# Patient Record
Sex: Male | Born: 1955 | Race: White | Hispanic: No | Marital: Married | Smoking: Former smoker
Health system: Southern US, Community
[De-identification: ages and names within clinical notes are randomized; demographics above are authoritative.]

## PROBLEM LIST (undated history)

## (undated) DIAGNOSIS — I10 Essential (primary) hypertension: Secondary | ICD-10-CM

## (undated) DIAGNOSIS — K219 Gastro-esophageal reflux disease without esophagitis: Secondary | ICD-10-CM

## (undated) DIAGNOSIS — I2510 Atherosclerotic heart disease of native coronary artery without angina pectoris: Secondary | ICD-10-CM

## (undated) DIAGNOSIS — G709 Myoneural disorder, unspecified: Secondary | ICD-10-CM

## (undated) DIAGNOSIS — M1990 Unspecified osteoarthritis, unspecified site: Secondary | ICD-10-CM

## (undated) DIAGNOSIS — T8859XA Other complications of anesthesia, initial encounter: Secondary | ICD-10-CM

## (undated) DIAGNOSIS — G4730 Sleep apnea, unspecified: Secondary | ICD-10-CM

## (undated) DIAGNOSIS — F419 Anxiety disorder, unspecified: Secondary | ICD-10-CM

## (undated) DIAGNOSIS — I509 Heart failure, unspecified: Secondary | ICD-10-CM

## (undated) DIAGNOSIS — I779 Disorder of arteries and arterioles, unspecified: Secondary | ICD-10-CM

## (undated) DIAGNOSIS — T7840XA Allergy, unspecified, initial encounter: Secondary | ICD-10-CM

## (undated) DIAGNOSIS — E785 Hyperlipidemia, unspecified: Secondary | ICD-10-CM

## (undated) DIAGNOSIS — M75101 Unspecified rotator cuff tear or rupture of right shoulder, not specified as traumatic: Secondary | ICD-10-CM

## (undated) DIAGNOSIS — F329 Major depressive disorder, single episode, unspecified: Secondary | ICD-10-CM

## (undated) DIAGNOSIS — C801 Malignant (primary) neoplasm, unspecified: Secondary | ICD-10-CM

## (undated) DIAGNOSIS — Z789 Other specified health status: Secondary | ICD-10-CM

## (undated) DIAGNOSIS — T4145XA Adverse effect of unspecified anesthetic, initial encounter: Secondary | ICD-10-CM

## (undated) DIAGNOSIS — I350 Nonrheumatic aortic (valve) stenosis: Secondary | ICD-10-CM

## (undated) DIAGNOSIS — F32A Depression, unspecified: Secondary | ICD-10-CM

## (undated) HISTORY — DX: Heart failure, unspecified: I50.9

## (undated) HISTORY — DX: Depression, unspecified: F32.A

## (undated) HISTORY — DX: Anxiety disorder, unspecified: F41.9

## (undated) HISTORY — DX: Unspecified osteoarthritis, unspecified site: M19.90

## (undated) HISTORY — PX: CHOLECYSTECTOMY: SHX55

## (undated) HISTORY — PX: LUMBAR LAMINECTOMY: SHX95

## (undated) HISTORY — DX: Allergy, unspecified, initial encounter: T78.40XA

## (undated) HISTORY — DX: Myoneural disorder, unspecified: G70.9

## (undated) HISTORY — PX: CARDIAC VALVE REPLACEMENT: SHX585

## (undated) HISTORY — DX: Major depressive disorder, single episode, unspecified: F32.9

## (undated) HISTORY — PX: FRACTURE SURGERY: SHX138

## (undated) HISTORY — DX: Sleep apnea, unspecified: G47.30

## (undated) HISTORY — DX: Gastro-esophageal reflux disease without esophagitis: K21.9

## (undated) HISTORY — PX: APPENDECTOMY: SHX54

## (undated) HISTORY — PX: SPINE SURGERY: SHX786

## (undated) HISTORY — DX: Hyperlipidemia, unspecified: E78.5

---

## 1999-04-11 ENCOUNTER — Emergency Department (HOSPITAL_COMMUNITY): Admission: EM | Admit: 1999-04-11 | Discharge: 1999-04-11 | Payer: Self-pay | Admitting: Emergency Medicine

## 1999-04-16 ENCOUNTER — Encounter: Payer: Self-pay | Admitting: Oral Surgery

## 1999-04-16 ENCOUNTER — Ambulatory Visit (HOSPITAL_COMMUNITY): Admission: RE | Admit: 1999-04-16 | Discharge: 1999-04-16 | Payer: Self-pay | Admitting: Oral Surgery

## 2003-06-09 ENCOUNTER — Ambulatory Visit (HOSPITAL_BASED_OUTPATIENT_CLINIC_OR_DEPARTMENT_OTHER): Admission: RE | Admit: 2003-06-09 | Discharge: 2003-06-09 | Payer: Self-pay | Admitting: Family Medicine

## 2005-12-05 ENCOUNTER — Encounter: Admission: RE | Admit: 2005-12-05 | Discharge: 2005-12-05 | Payer: Self-pay | Admitting: Family Medicine

## 2005-12-05 ENCOUNTER — Ambulatory Visit: Payer: Self-pay | Admitting: Family Medicine

## 2005-12-09 ENCOUNTER — Ambulatory Visit: Payer: Self-pay | Admitting: Family Medicine

## 2005-12-16 ENCOUNTER — Ambulatory Visit: Payer: Self-pay | Admitting: Family Medicine

## 2005-12-17 ENCOUNTER — Ambulatory Visit: Payer: Self-pay | Admitting: Gastroenterology

## 2005-12-20 ENCOUNTER — Ambulatory Visit: Payer: Self-pay | Admitting: Cardiovascular Disease

## 2005-12-20 ENCOUNTER — Inpatient Hospital Stay (HOSPITAL_COMMUNITY): Admission: EM | Admit: 2005-12-20 | Discharge: 2005-12-21 | Payer: Self-pay | Admitting: Emergency Medicine

## 2005-12-21 ENCOUNTER — Ambulatory Visit: Payer: Self-pay | Admitting: Internal Medicine

## 2005-12-25 ENCOUNTER — Ambulatory Visit: Payer: Self-pay

## 2006-01-11 ENCOUNTER — Encounter: Admission: RE | Admit: 2006-01-11 | Discharge: 2006-01-11 | Payer: Self-pay | Admitting: Family Medicine

## 2006-01-15 ENCOUNTER — Ambulatory Visit: Payer: Self-pay | Admitting: Family Medicine

## 2006-02-06 ENCOUNTER — Encounter (INDEPENDENT_AMBULATORY_CARE_PROVIDER_SITE_OTHER): Payer: Self-pay | Admitting: *Deleted

## 2006-02-06 ENCOUNTER — Ambulatory Visit (HOSPITAL_COMMUNITY): Admission: RE | Admit: 2006-02-06 | Discharge: 2006-02-07 | Payer: Self-pay | Admitting: General Surgery

## 2006-04-06 ENCOUNTER — Ambulatory Visit: Payer: Self-pay | Admitting: Family Medicine

## 2006-04-07 ENCOUNTER — Ambulatory Visit: Payer: Self-pay | Admitting: Family Medicine

## 2006-04-08 ENCOUNTER — Ambulatory Visit (HOSPITAL_COMMUNITY): Admission: RE | Admit: 2006-04-08 | Discharge: 2006-04-08 | Payer: Self-pay | Admitting: Gastroenterology

## 2006-04-08 ENCOUNTER — Encounter (INDEPENDENT_AMBULATORY_CARE_PROVIDER_SITE_OTHER): Payer: Self-pay | Admitting: *Deleted

## 2006-04-27 ENCOUNTER — Ambulatory Visit (HOSPITAL_COMMUNITY): Admission: RE | Admit: 2006-04-27 | Discharge: 2006-04-27 | Payer: Self-pay | Admitting: Urology

## 2006-06-02 ENCOUNTER — Ambulatory Visit: Payer: Self-pay | Admitting: Family Medicine

## 2006-07-06 ENCOUNTER — Ambulatory Visit: Payer: Self-pay | Admitting: Family Medicine

## 2006-09-04 ENCOUNTER — Ambulatory Visit: Payer: Self-pay | Admitting: Family Medicine

## 2006-12-08 ENCOUNTER — Ambulatory Visit: Payer: Self-pay | Admitting: Family Medicine

## 2006-12-10 ENCOUNTER — Ambulatory Visit: Payer: Self-pay | Admitting: Family Medicine

## 2006-12-10 LAB — CONVERTED CEMR LAB
BUN: 18 mg/dL (ref 6–23)
CO2: 26 meq/L (ref 19–32)
Calcium: 9.6 mg/dL (ref 8.4–10.5)
Chloride: 106 meq/L (ref 96–112)
GFR calc non Af Amer: 95 mL/min
Hgb A1c MFr Bld: 6 % (ref 4.6–6.0)
Microalb Creat Ratio: 6.7 mg/g (ref 0.0–30.0)
Sodium: 139 meq/L (ref 135–145)
Triglyceride fasting, serum: 148 mg/dL (ref 0–149)
VLDL: 30 mg/dL (ref 0–40)

## 2007-01-27 DIAGNOSIS — E1151 Type 2 diabetes mellitus with diabetic peripheral angiopathy without gangrene: Secondary | ICD-10-CM

## 2007-01-27 DIAGNOSIS — E785 Hyperlipidemia, unspecified: Secondary | ICD-10-CM | POA: Insufficient documentation

## 2007-01-27 DIAGNOSIS — E1165 Type 2 diabetes mellitus with hyperglycemia: Secondary | ICD-10-CM

## 2007-01-27 DIAGNOSIS — E119 Type 2 diabetes mellitus without complications: Secondary | ICD-10-CM | POA: Insufficient documentation

## 2007-02-09 ENCOUNTER — Ambulatory Visit: Payer: Self-pay | Admitting: Family Medicine

## 2007-03-10 ENCOUNTER — Ambulatory Visit: Payer: Self-pay | Admitting: Family Medicine

## 2007-03-10 LAB — CONVERTED CEMR LAB
ALT: 39 units/L (ref 0–40)
AST: 30 units/L (ref 0–37)
BUN: 15 mg/dL (ref 6–23)
CO2: 33 meq/L — ABNORMAL HIGH (ref 19–32)
Calcium: 8.9 mg/dL (ref 8.4–10.5)
Chloride: 102 meq/L (ref 96–112)
Glucose, Bld: 174 mg/dL — ABNORMAL HIGH (ref 70–99)
HDL: 31 mg/dL — ABNORMAL LOW (ref >39.0)
LDL Cholesterol: 54 mg/dL (ref 0–99)
Sodium: 140 meq/L (ref 135–145)
Total CHOL/HDL Ratio: 3.8
Total Protein: 6.9 g/dL (ref 6.0–8.3)

## 2007-09-13 ENCOUNTER — Telehealth (INDEPENDENT_AMBULATORY_CARE_PROVIDER_SITE_OTHER): Payer: Self-pay | Admitting: *Deleted

## 2007-09-13 ENCOUNTER — Emergency Department (HOSPITAL_COMMUNITY): Admission: EM | Admit: 2007-09-13 | Discharge: 2007-09-13 | Payer: Self-pay | Admitting: Emergency Medicine

## 2007-09-15 ENCOUNTER — Ambulatory Visit: Payer: Self-pay | Admitting: Family Medicine

## 2007-09-21 LAB — CONVERTED CEMR LAB
AST: 31 units/L (ref 0–37)
Albumin: 4 g/dL (ref 3.5–5.2)
Alkaline Phosphatase: 90 units/L (ref 39–117)
BUN: 18 mg/dL (ref 6–23)
Calcium: 10 mg/dL (ref 8.4–10.5)
Creatinine, Ser: 0.8 mg/dL (ref 0.4–1.5)
GFR calc Af Amer: 131 mL/min
GFR calc non Af Amer: 108 mL/min
HCT: 41.3 % (ref 39.0–52.0)
Hemoglobin: 14.5 g/dL (ref 13.0–17.0)
Hgb A1c MFr Bld: 7 % — ABNORMAL HIGH (ref 4.6–6.0)
Lymphocytes Relative: 35.1 % (ref 12.0–46.0)
MCHC: 35 g/dL (ref 30.0–36.0)
Monocytes Relative: 9.8 % (ref 3.0–11.0)
Potassium: 4.9 meq/L (ref 3.5–5.1)
RDW: 12.9 % (ref 11.5–14.6)
Sodium: 141 meq/L (ref 135–145)
Total Protein: 7.1 g/dL (ref 6.0–8.3)

## 2007-11-23 ENCOUNTER — Telehealth (INDEPENDENT_AMBULATORY_CARE_PROVIDER_SITE_OTHER): Payer: Self-pay | Admitting: *Deleted

## 2007-11-24 ENCOUNTER — Ambulatory Visit: Payer: Self-pay | Admitting: Family Medicine

## 2007-11-24 DIAGNOSIS — M542 Cervicalgia: Secondary | ICD-10-CM

## 2008-02-15 ENCOUNTER — Telehealth (INDEPENDENT_AMBULATORY_CARE_PROVIDER_SITE_OTHER): Payer: Self-pay | Admitting: *Deleted

## 2008-10-03 ENCOUNTER — Ambulatory Visit: Payer: Self-pay | Admitting: Internal Medicine

## 2008-10-03 ENCOUNTER — Encounter: Payer: Self-pay | Admitting: Family Medicine

## 2008-10-03 ENCOUNTER — Telehealth: Payer: Self-pay | Admitting: Family Medicine

## 2008-10-03 ENCOUNTER — Ambulatory Visit: Payer: Self-pay | Admitting: Cardiology

## 2008-10-03 ENCOUNTER — Encounter: Payer: Self-pay | Admitting: Emergency Medicine

## 2008-10-03 ENCOUNTER — Observation Stay (HOSPITAL_COMMUNITY): Admission: AD | Admit: 2008-10-03 | Discharge: 2008-10-04 | Payer: Self-pay | Admitting: Internal Medicine

## 2008-10-04 ENCOUNTER — Ambulatory Visit: Payer: Self-pay | Admitting: Vascular Surgery

## 2008-10-04 ENCOUNTER — Encounter: Payer: Self-pay | Admitting: Family Medicine

## 2008-10-04 ENCOUNTER — Encounter (INDEPENDENT_AMBULATORY_CARE_PROVIDER_SITE_OTHER): Payer: Self-pay | Admitting: Internal Medicine

## 2008-10-10 ENCOUNTER — Telehealth (INDEPENDENT_AMBULATORY_CARE_PROVIDER_SITE_OTHER): Payer: Self-pay | Admitting: *Deleted

## 2008-10-10 DIAGNOSIS — R0789 Other chest pain: Secondary | ICD-10-CM

## 2008-10-17 ENCOUNTER — Ambulatory Visit: Payer: Self-pay | Admitting: Family Medicine

## 2008-10-17 DIAGNOSIS — M79609 Pain in unspecified limb: Secondary | ICD-10-CM

## 2008-10-18 ENCOUNTER — Encounter (INDEPENDENT_AMBULATORY_CARE_PROVIDER_SITE_OTHER): Payer: Self-pay | Admitting: *Deleted

## 2008-10-24 ENCOUNTER — Ambulatory Visit: Payer: Self-pay

## 2008-10-24 ENCOUNTER — Encounter: Payer: Self-pay | Admitting: Family Medicine

## 2008-10-24 ENCOUNTER — Encounter (INDEPENDENT_AMBULATORY_CARE_PROVIDER_SITE_OTHER): Payer: Self-pay | Admitting: *Deleted

## 2008-10-25 ENCOUNTER — Ambulatory Visit: Payer: Self-pay | Admitting: Cardiology

## 2008-10-25 ENCOUNTER — Encounter: Payer: Self-pay | Admitting: Family Medicine

## 2008-10-30 ENCOUNTER — Encounter (INDEPENDENT_AMBULATORY_CARE_PROVIDER_SITE_OTHER): Payer: Self-pay | Admitting: *Deleted

## 2008-11-02 ENCOUNTER — Encounter (INDEPENDENT_AMBULATORY_CARE_PROVIDER_SITE_OTHER): Payer: Self-pay | Admitting: *Deleted

## 2008-11-02 ENCOUNTER — Encounter: Payer: Self-pay | Admitting: Family Medicine

## 2008-11-07 ENCOUNTER — Telehealth (INDEPENDENT_AMBULATORY_CARE_PROVIDER_SITE_OTHER): Payer: Self-pay | Admitting: *Deleted

## 2008-11-07 ENCOUNTER — Encounter: Payer: Self-pay | Admitting: Family Medicine

## 2009-01-19 ENCOUNTER — Ambulatory Visit: Payer: Self-pay | Admitting: Family Medicine

## 2009-01-20 ENCOUNTER — Encounter: Payer: Self-pay | Admitting: Family Medicine

## 2009-01-22 ENCOUNTER — Encounter (INDEPENDENT_AMBULATORY_CARE_PROVIDER_SITE_OTHER): Payer: Self-pay | Admitting: *Deleted

## 2009-02-02 LAB — CONVERTED CEMR LAB
ALT: 25 units/L (ref 0–53)
BUN: 18 mg/dL (ref 6–23)
Basophils Absolute: 0 10*3/uL (ref 0.0–0.1)
CO2: 29 meq/L (ref 19–32)
Calcium: 9.5 mg/dL (ref 8.4–10.5)
Chloride: 105 meq/L (ref 96–112)
Creatinine, Ser: 0.7 mg/dL (ref 0.4–1.5)
Eosinophils Absolute: 0.2 10*3/uL (ref 0.0–0.7)
GFR calc non Af Amer: 125 mL/min
LDL Cholesterol: 124 mg/dL — ABNORMAL HIGH (ref 0–99)
Microalb Creat Ratio: 8.4 mg/g (ref 0.0–30.0)
Microalb, Ur: 1.6 mg/dL (ref 0.0–1.9)
Monocytes Relative: 7.3 % (ref 3.0–12.0)
Neutrophils Relative %: 56.9 % (ref 43.0–77.0)
Potassium: 4.3 meq/L (ref 3.5–5.1)
RBC: 5.31 M/uL (ref 4.22–5.81)
RDW: 12.9 % (ref 11.5–14.6)
Sodium: 140 meq/L (ref 135–145)
Triglycerides: 112 mg/dL (ref 0–149)
VLDL: 22 mg/dL (ref 0–40)
WBC: 6.3 10*3/uL (ref 4.5–10.5)

## 2009-04-02 ENCOUNTER — Telehealth (INDEPENDENT_AMBULATORY_CARE_PROVIDER_SITE_OTHER): Payer: Self-pay | Admitting: *Deleted

## 2009-11-26 ENCOUNTER — Encounter: Payer: Self-pay | Admitting: Family Medicine

## 2010-03-01 ENCOUNTER — Telehealth (INDEPENDENT_AMBULATORY_CARE_PROVIDER_SITE_OTHER): Payer: Self-pay | Admitting: *Deleted

## 2010-03-26 ENCOUNTER — Encounter (INDEPENDENT_AMBULATORY_CARE_PROVIDER_SITE_OTHER): Payer: Self-pay | Admitting: *Deleted

## 2010-03-26 ENCOUNTER — Ambulatory Visit: Payer: Self-pay | Admitting: Family Medicine

## 2010-03-26 DIAGNOSIS — R110 Nausea: Secondary | ICD-10-CM | POA: Insufficient documentation

## 2010-03-26 DIAGNOSIS — N4889 Other specified disorders of penis: Secondary | ICD-10-CM | POA: Insufficient documentation

## 2010-03-26 LAB — CONVERTED CEMR LAB
Bilirubin Urine: NEGATIVE
Blood in Urine, dipstick: NEGATIVE
Specific Gravity, Urine: 1.015
WBC Urine, dipstick: NEGATIVE

## 2010-03-27 ENCOUNTER — Encounter (INDEPENDENT_AMBULATORY_CARE_PROVIDER_SITE_OTHER): Payer: Self-pay | Admitting: *Deleted

## 2010-03-27 ENCOUNTER — Ambulatory Visit: Payer: Self-pay | Admitting: Family Medicine

## 2010-03-28 ENCOUNTER — Encounter (INDEPENDENT_AMBULATORY_CARE_PROVIDER_SITE_OTHER): Payer: Self-pay | Admitting: *Deleted

## 2010-03-29 ENCOUNTER — Telehealth (INDEPENDENT_AMBULATORY_CARE_PROVIDER_SITE_OTHER): Payer: Self-pay | Admitting: *Deleted

## 2010-03-29 LAB — CONVERTED CEMR LAB
Albumin: 4 g/dL (ref 3.5–5.2)
Alkaline Phosphatase: 92 units/L (ref 39–117)
Basophils Absolute: 0 10*3/uL (ref 0.0–0.1)
Basophils Relative: 0.2 % (ref 0.0–3.0)
CO2: 28 meq/L (ref 19–32)
Calcium: 9.2 mg/dL (ref 8.4–10.5)
Chloride: 104 meq/L (ref 96–112)
Creatinine, Ser: 0.8 mg/dL (ref 0.4–1.5)
Creatinine,U: 106 mg/dL
Direct LDL: 109.1 mg/dL
Eosinophils Absolute: 0.2 10*3/uL (ref 0.0–0.7)
GFR calc non Af Amer: 106.77 mL/min (ref >60)
Glucose, Bld: 164 mg/dL — ABNORMAL HIGH (ref 70–99)
HCT: 44.1 % (ref 39.0–52.0)
HDL: 33.8 mg/dL — ABNORMAL LOW (ref >39.00)
MCV: 84.5 fL (ref 78.0–100.0)
Microalb, Ur: 1 mg/dL (ref 0.0–1.9)
Potassium: 4.3 meq/L (ref 3.5–5.1)
RBC: 5.22 M/uL (ref 4.22–5.81)
RDW: 12.3 % (ref 11.5–14.6)
Triglycerides: 234 mg/dL — ABNORMAL HIGH (ref 0.0–149.0)

## 2010-04-01 ENCOUNTER — Encounter: Payer: Self-pay | Admitting: Family Medicine

## 2010-04-04 ENCOUNTER — Encounter: Payer: Self-pay | Admitting: Family Medicine

## 2010-04-15 ENCOUNTER — Ambulatory Visit: Payer: Self-pay | Admitting: Family Medicine

## 2010-04-16 ENCOUNTER — Encounter: Payer: Self-pay | Admitting: Family Medicine

## 2010-05-02 ENCOUNTER — Encounter: Payer: Self-pay | Admitting: Family Medicine

## 2010-05-30 ENCOUNTER — Telehealth: Payer: Self-pay | Admitting: Family Medicine

## 2010-06-06 ENCOUNTER — Encounter: Payer: Self-pay | Admitting: Family Medicine

## 2010-08-26 ENCOUNTER — Encounter: Payer: Self-pay | Admitting: Family Medicine

## 2010-11-05 ENCOUNTER — Ambulatory Visit: Payer: Self-pay | Admitting: Family Medicine

## 2010-11-05 ENCOUNTER — Telehealth: Payer: Self-pay | Admitting: Family Medicine

## 2010-11-05 DIAGNOSIS — R5383 Other fatigue: Secondary | ICD-10-CM

## 2010-11-05 DIAGNOSIS — R5381 Other malaise: Secondary | ICD-10-CM

## 2010-11-05 LAB — CONVERTED CEMR LAB
Bilirubin Urine: NEGATIVE
Nitrite: NEGATIVE
Specific Gravity, Urine: 1.025
Urobilinogen, UA: NEGATIVE
WBC Urine, dipstick: NEGATIVE

## 2010-11-12 ENCOUNTER — Telehealth (INDEPENDENT_AMBULATORY_CARE_PROVIDER_SITE_OTHER): Payer: Self-pay | Admitting: *Deleted

## 2010-11-12 LAB — CONVERTED CEMR LAB
BUN: 16 mg/dL
Basophils Absolute: 0 K/uL
Basophils Relative: 0.5 %
CO2: 31 meq/L
Calcium: 9.9 mg/dL
Chloride: 100 meq/L
Creatinine, Ser: 0.9 mg/dL
Creatinine,U: 141.8 mg/dL
Eosinophils Absolute: 0.2 K/uL
Eosinophils Relative: 2.5 %
Folate: 10.9 ng/mL
GFR calc non Af Amer: 99.33 mL/min
Glucose, Bld: 125 mg/dL — ABNORMAL HIGH
HCT: 43.4 %
Hemoglobin: 15.1 g/dL
Lymphocytes Relative: 36.6 %
Lymphs Abs: 2.3 K/uL
MCHC: 34.8 g/dL
MCV: 84.4 fL
Microalb Creat Ratio: 2.6 mg/g
Microalb, Ur: 3.7 mg/dL — ABNORMAL HIGH
Monocytes Absolute: 0.5 K/uL
Monocytes Relative: 8.3 %
Neutro Abs: 3.2 K/uL
Neutrophils Relative %: 52.1 %
Platelets: 189 K/uL
Potassium: 4.7 meq/L
RBC: 5.14 M/uL
RDW: 13.3 %
Sodium: 139 meq/L
Vitamin B-12: 301 pg/mL
WBC: 6.2 10*3/microliter

## 2010-11-26 ENCOUNTER — Ambulatory Visit: Payer: Self-pay | Admitting: Family Medicine

## 2010-11-28 ENCOUNTER — Ambulatory Visit: Payer: Self-pay | Admitting: Family Medicine

## 2011-01-13 ENCOUNTER — Telehealth (INDEPENDENT_AMBULATORY_CARE_PROVIDER_SITE_OTHER): Payer: Self-pay | Admitting: *Deleted

## 2011-01-19 ENCOUNTER — Encounter: Payer: Self-pay | Admitting: Urology

## 2011-01-20 ENCOUNTER — Encounter: Payer: Self-pay | Admitting: Family Medicine

## 2011-01-22 ENCOUNTER — Telehealth: Payer: Self-pay | Admitting: Family Medicine

## 2011-01-28 NOTE — Medication Information (Signed)
Summary: Care Consideration Regarding Adding a Statin/[employer]  Care Consideration Regarding Adding a Statin/[employer]   Imported By: Lanelle Bal 10/04/2010 10:59:13  _____________________________________________________________________  External Attachment:    Type:   Image     Comment:   External Document

## 2011-01-28 NOTE — Consult Note (Signed)
Summary: [facility]  [facility]   Imported By: Lanelle Bal 04/26/2010 14:09:10  _____________________________________________________________________  External Attachment:    Type:   Image     Comment:   External Document

## 2011-01-28 NOTE — Progress Notes (Signed)
Summary: Request for Pain meds  Phone Note Call from Patient   Caller: Patient Call For: [name] DO Details for Reason: Meds Request Summary of Call:  In office---Pt wanted to get and RX for Oxycodone 5-325 and another Rx will call back with the information on the other Rx when he gets home  sent to [employer] c/b [id number]. Please advise Initial call taken by: [name] [name] Dull),  November 05, 2010 11:07 AM  Follow-up for Phone Call        ok to refill x1 Follow-up by: [name] DO,  November 05, 2010 11:52 AM  Additional Follow-up for Phone Call Additional follow up Details #1::        pt called and also wanted to get an Rx for Piroxicam 20 mg, please advise on Direction as well. Additional Follow-up by: [name] [name] Dull),  November 05, 2010 2:07 PM    Additional Follow-up for Phone Call Additional follow up Details #2::    piroxicam 20 mg  #30 1 by mouth once daily prn   1 refils Follow-up by: [name] DO,  November 05, 2010 2:08 PM  New/Updated Medications: PERCOCET 5-325 MG TABS (OXYCODONE-ACETAMINOPHEN) 1 by mouth every 4-6 hours as needed PIROXICAM 20 MG CAPS (PIROXICAM) 1 by mouth once daily Prescriptions: PIROXICAM 20 MG CAPS (PIROXICAM) 1 by mouth once daily  #30 x 1   Entered by:   [name] CMA (AAMA)   Authorized by:   [name] DO   Signed by:   [name] CMA (AAMA) on 11/05/2010   Method used:   Print then Give to Patient   RxID:   [id number] PERCOCET 5-325 MG TABS (OXYCODONE-ACETAMINOPHEN) 1 by mouth every 4-6 hours as needed  #30 x 0   Entered by:   [name] CMA (AAMA)   Authorized by:   [name] DO   Signed by:   [name] CMA (AAMA) on 11/05/2010   Method used:   Print then Give to Patient   RxID:   [id number]  Vm left for pt to pickup meds at the office.... [name] [name] Dull)  November 05, 2010 2:58 PM

## 2011-01-28 NOTE — Letter (Signed)
Summary: New Patient letter  [facility]  [address] [place], [place] [address]   Phone: [phone]  Fax: [phone]       03/27/2010 MRN: [id number]  [facility] [address] [place], [place]  [address]  Dear Ronald Petersen,  Welcome to the Gastroenterology Division at [facility].    You are scheduled to see Dr. Christella Hartigan on 04-30-10 at 11:00a.m. on the 3rd floor at [facility], [address]  We ask that you try to arrive at our office 15 minutes prior to your appointment time to allow for check-in.  We would like you to complete the enclosed self-administered evaluation form prior to your visit and bring it with you on the day of your appointment.  We will review it with you.  Also, please bring a complete list of all your medications or, if you prefer, bring the medication bottles and we will list them.  Please bring your insurance card so that we may make a copy of it.  If your insurance requires a referral to see a specialist, please bring your referral form from your primary care physician.  Co-payments are due at the time of your visit and may be paid by cash, check or credit card.     Your office visit will consist of a consult with your physician (includes a physical exam), any laboratory testing he/she may order, scheduling of any necessary diagnostic testing (e.g. x-ray, ultrasound, CT-scan), and scheduling of a procedure (e.g. Endoscopy, Colonoscopy) if required.  Please allow enough time on your schedule to allow for any/all of these possibilities.    If you cannot keep your appointment, please call [phone] to cancel or reschedule prior to your appointment date.  This allows [place] the opportunity to schedule an appointment for another patient in need of care.  If you do not cancel or reschedule by 5 p.m. the business day prior to your appointment date, you will be charged a $50.00 late cancellation/no-show fee.    Thank you for choosing  [facility] for your medical needs.  We appreciate the opportunity to care for you.  Please visit [place] at our website  to learn more about our practice.                     Sincerely,                                                             The Gastroenterology Division

## 2011-01-28 NOTE — Progress Notes (Signed)
Summary: ZOLOFT/SERTRALINE REFILL  Phone Note Call from Patient Call back at Home Phone [phone]   Caller: Patient Summary of Call: PATIENT DROPPED OFF YELLOW REFILL AUTHORIZATION FORM FROM [facility] FOR ZOLOFT 100MG  OR SERTRALINE 100 MG---SAYS HE HAS BEEN OUT FOR FIVE DAYS ALREADY, SO HE WOULD REALLY LIKE IT IF IT COULD BE CALLED IN TODAY  TOOK FORM IN PLASTIC SLEEVE TO [facility] SINCE HE IS A PATIENT OF DR LOWNE--PLEASE CALL HIM TO TELL HIM WHEN IT HAS BEEN CALLED IN Initial call taken by: [name],  March 01, 2010 12:38 PM  Follow-up for Phone Call        left pt detail message rx sent to pharmacy. pt has pending OV 03-26-10.......................Marland KitchenFelecia Deloach CMA  March 01, 2010 2:09 PM     Prescriptions: ZOLOFT 100 MG TABS (SERTRALINE HCL) Take 1 tablet by mouth once a day  #30 Each x 0   Entered by:   Jeremy Johann CMA   Authorized by:   [name] DO   Signed by:   Jeremy Johann CMA on 03/01/2010   Method used:   Faxed to ...       [facility]* (retail)       [address] [place], [place]  [address]       Ph: [phone]       Fax: [phone]   RxID:   [id number]

## 2011-01-28 NOTE — Consult Note (Signed)
Summary: [facility]   Imported By: Lanelle Bal 06/15/2010 10:58:55  _____________________________________________________________________  External Attachment:    Type:   Image     Comment:   External Document

## 2011-01-28 NOTE — Progress Notes (Signed)
Summary: labs  Phone Note Outgoing Call   Call placed by: Doristine Devoid CMA,  November 12, 2010 4:48 PM Call placed to: Patient Summary of Call: so far great! ---cholesterol and hgba1c still pending make sure pt is only taking kombiglyza-----not metformin and kombiglyza recheck 6 months   Follow-up for Phone Call        left message on machine ....Marland KitchenMarland KitchenDoristine Devoid CMA  November 12, 2010 4:48 PM   Additional Follow-up for Phone Call Additional follow up Details #1::        Patient is aware and is only taking kombiglyza. Additional Follow-up by: Harold Barban,  November 13, 2010 2:01 PM

## 2011-01-28 NOTE — Consult Note (Signed)
Summary: [facility]  [facility]   Imported By: Lanelle Bal 04/18/2010 12:04:24  _____________________________________________________________________  External Attachment:    Type:   Image     Comment:   External Document

## 2011-01-28 NOTE — Progress Notes (Signed)
Summary: Lab Results  Phone Note Outgoing Call Call back at Home Phone [phone]   Call placed by: Shonna Chock,  March 29, 2010 8:58 AM Call placed to: Patient Summary of Call: Left message on machine for patient to return call when avaliable, Reason for call:   DM-- hgba1c slightly elevated---- change glucophage to kombiglyza-- 5/ 1000 1 by mouth once daily #30  2 refills----give coupon with rx Ideally your LDL (bad cholesterol) should be <__70__, your HDL (good cholesterol) should be >_40__ and your triglycerides should be< 150.  Diet and exercise will increase HDL and decrease the LDL and triglycerides. Read Dr. Danice Goltz book--Eat Drink and Be Healthy.   Start Lipitor 20 mg #30  1 by mouth  at bedtime,  2 refills--- give $4 coupon with rx.  We will recheck labs in __3_ months.   250.00  272.4  lipid, bmp, hep, hgba1c, microalbumin   Chrae Malloy  March 29, 2010 8:58 AM   Follow-up for Phone Call        Left message to call back. Army Fossa CMA  April 02, 2010 3:12 PM   Additional Follow-up for Phone Call Additional follow up Details #1::        Pt is aware. Army Fossa CMA  April 05, 2010 8:44 AM Coupons upfront for pt. Army Fossa CMA  April 05, 2010 8:45 AM     New/Updated Medications: [name] XR 04-999 MG XR24H-TAB (SAXAGLIPTIN-METFORMIN) 1 by mouth daily. LIPITOR 20 MG TABS (ATORVASTATIN CALCIUM) 1 by mouth at bedtime. Prescriptions: LIPITOR 20 MG TABS (ATORVASTATIN CALCIUM) 1 by mouth at bedtime.  #30 x 2   Entered by:   Army Fossa CMA   Authorized by:   [name] DO   Signed by:   Army Fossa CMA on 04/05/2010   Method used:   Electronically to        [facility]* (retail)       [address] [place], [place]  [address]       Ph: [phone]       Fax: [phone]   RxID:   [id number] [name] XR 04-999 MG XR24H-TAB (SAXAGLIPTIN-METFORMIN) 1 by mouth daily.  #30 x 2   Entered by:   Army Fossa CMA   Authorized by:   [name] DO   Signed by:   Army Fossa CMA on 04/05/2010   Method used:   Electronically to        [facility]* (retail)       [address] [place], [place]  [address]       Ph: [phone]       Fax: [phone]   RxID:   [id number]

## 2011-01-28 NOTE — Assessment & Plan Note (Signed)
Summary: CPX/KDC   Vital Signs:  Patient profile:   55 year old male Height:      73.5 inches Weight:      247 pounds BMI:     32.26 Pulse rate:   70 / minute Pulse rhythm:   regular BP sitting:   124 / 82  (left arm) Cuff size:   large  Vitals Entered By: Army Fossa CMA (March 26, 2010 1:12 PM) CC: CPX. No complaints   History of Present Illness: Pt here for cpe.  Pt c/o nausea with any eating-- no abd pain, no other reflux symptoms. Pt also c/o knot on penis that is getting bigger and is tender.    Type 1 diabetes mellitus follow-up      This is a 55 year old man who presents with Type 2 diabetes mellitus follow-up.  The patient denies polyuria, polydipsia, blurred vision, self managed hypoglycemia, hypoglycemia requiring help, weight loss, weight gain, and numbness of extremities.  The patient denies the following symptoms: neuropathic pain, chest pain, vomiting, orthostatic symptoms, poor wound healing, intermittent claudication, vision loss, and foot ulcer.  Since the last visit the patient reports good dietary compliance, compliance with medications fair, exercising regularly, and not monitoring blood glucose.  Since the last visit, the patient reports having had eye care by an ophthalmologist and no foot care.  Pt is not taking the metformin two times a day.  Preventive Screening-Counseling & Management  Alcohol-Tobacco     Alcohol drinks/day: <1     Smoking Status: never  Caffeine-Diet-Exercise     Caffeine use/day: 3     Does Patient Exercise: yes     Type of exercise: walking     Times/week: 5  Hep-HIV-STD-Contraception     Dental Visit-last 6 months yes     Dental Care Counseling: not indicated; dental care within six months  Safety-Violence-Falls     Seat Belt Use: 100      Sexual History:  currently monogamous.    Current Medications (verified): 1)  Zoloft 100 Mg Tabs (Sertraline Hcl) .... Take 1 Tablet By Mouth Once A Day 2)  Metformin Hcl 1000 Mg  Tabs (Metformin Hcl) .... Take 1 Tablet By Mouth Twice A Day. Labs and Office Visit Due. 3)  Omeprazole 20 Mg Cpdr (Omeprazole) .Marland Kitchen.. 1 By Mouth Once Daily  Allergies: 1)  Niaspan (Niacin (Antihyperlipidemic))  Past History:  Past Medical History: Last updated: 01/27/2007 Diabetes mellitus, type II Hyperlipidemia  Past Surgical History: Last updated: 01/19/2009 Lumbar laminectomy   Appendectomy Cholecystectomy  Family History: Last updated: 03/26/2010 Family History of Alcoholism/Addiction Family History Depression Family History of Arthritis Family History Hypertension Family History Ovarian cancer--Sister Arboriculturist MGM--stomach cancer m--copd Family History of Stroke M 1st degree relative 73 yo-- father Family History of CAD Male 1st degree relative <60  Social History: Last updated: 01/19/2009 Occupation:  [employer] Married Never Smoked Alcohol use-no Drug use-no Regular exercise-no  Risk Factors: Alcohol Use: <1 (03/26/2010) Caffeine Use: 3 (03/26/2010) Exercise: yes (03/26/2010)  Risk Factors: Smoking Status: never (03/26/2010)  Family History: Reviewed history from 01/19/2009 and no changes required. Family History of Alcoholism/Addiction Family History Depression Family History of Arthritis Family History Hypertension Family History Ovarian cancer--Sister Arboriculturist MGM--stomach cancer m--copd Family History of Stroke M 1st degree relative 15 yo-- father Family History of CAD Male 1st degree relative <60  Social History: Reviewed history from 01/19/2009 and no changes required. Occupation:  [employer] Married Never Smoked Alcohol use-no Drug use-no  Regular exercise-no Does Patient Exercise:  yes Dental Care w/in 6 mos.:  yes Sexual History:  currently monogamous  Review of Systems      See HPI General:  Denies chills, fatigue, fever, loss of appetite, malaise, sleep disorder, sweats,  weakness, and weight loss. Eyes:  Denies blurring, discharge, double vision, eye irritation, eye pain, halos, itching, light sensitivity, red eye, vision loss-1 eye, and vision loss-both eyes; ophtho--due. ENT:  Denies decreased hearing, difficulty swallowing, ear discharge, earache, hoarseness, nasal congestion, nosebleeds, postnasal drainage, ringing in ears, sinus pressure, and sore throat. CV:  Denies bluish discoloration of lips or nails, chest pain or discomfort, difficulty breathing at night, difficulty breathing while lying down, fainting, fatigue, leg cramps with exertion, lightheadness, near fainting, palpitations, shortness of breath with exertion, swelling of feet, swelling of hands, and weight gain. Resp:  Denies chest discomfort, chest pain with inspiration, cough, coughing up blood, excessive snoring, hypersomnolence, morning headaches, pleuritic, shortness of breath, sputum productive, and wheezing. GI:  Complains of nausea; denies abdominal pain, bloody stools, change in bowel habits, constipation, dark tarry stools, diarrhea, excessive appetite, gas, hemorrhoids, indigestion, loss of appetite, vomiting, vomiting blood, and yellowish skin color. GU:  Denies decreased libido, discharge, dysuria, erectile dysfunction, genital sores, hematuria, incontinence, nocturia, urinary frequency, and urinary hesitancy. MS:  Denies joint pain, joint redness, joint swelling, loss of strength, low back pain, mid back pain, muscle aches, muscle , cramps, muscle weakness, stiffness, and thoracic pain. Derm:  Denies changes in color of skin, changes in nail beds, dryness, excessive perspiration, flushing, hair loss, insect bite(s), itching, lesion(s), poor wound healing, and rash. Neuro:  Denies brief paralysis, difficulty with concentration, disturbances in coordination, falling down, headaches, inability to speak, memory loss, numbness, poor balance, seizures, sensation of room spinning, tingling, tremors,  visual disturbances, and weakness. Psych:  Denies alternate hallucination ( auditory/visual), anxiety, depression, easily angered, easily tearful, irritability, mental problems, panic attacks, sense of great danger, suicidal thoughts/plans, thoughts of violence, unusual visions or sounds, and thoughts /plans of harming others. Endo:  Denies cold intolerance, excessive hunger, excessive thirst, excessive urination, heat intolerance, polyuria, and weight change. Heme:  Denies abnormal bruising, bleeding, enlarge lymph nodes, fevers, pallor, and skin discoloration. Allergy:  Denies hives or rash, itching eyes, persistent infections, seasonal allergies, and sneezing.  Physical Exam  General:  Well-developed,well-nourished,in no acute distress; alert,appropriate and cooperative throughout examination Head:  Normocephalic and atraumatic without obvious abnormalities. No apparent alopecia or balding. Eyes:  vision grossly intact, pupils equal, pupils round, pupils reactive to light, and no injection.   Ears:  External ear exam shows no significant lesions or deformities.  Otoscopic examination reveals clear canals, tympanic membranes are intact bilaterally without bulging, retraction, inflammation or discharge. Hearing is grossly normal bilaterally. Nose:  External nasal examination shows no deformity or inflammation. Nasal mucosa are pink and moist without lesions or exudates. Mouth:  Oral mucosa and oropharynx without lesions or exudates.  Teeth in good repair. Neck:  No deformities, masses, or tenderness noted. Lungs:  Normal respiratory effort, chest expands symmetrically. Lungs are clear to auscultation, no crackles or wheezes. Heart:  Normal rate and regular rhythm. S1 and S2 normal without gallop, murmur, click, rub or other extra sounds. Abdomen:  soft, non-tender, normal bowel sounds, no distention, no masses, no guarding, no rigidity, and no rebound tenderness.  + ventral hernia Rectal:  No  external abnormalities noted. Normal sphincter tone. No rectal masses or tenderness. Genitalia:  Testes bilaterally descended without nodularity, tenderness or  masses. No scrotal masses or lesions. + penile nodule ant penis--pea sized Prostate:  Prostate gland firm and smooth, no enlargement, nodularity, tenderness, mass, asymmetry or induration. Msk:  normal ROM, no joint tenderness, no joint swelling, no joint warmth, no redness over joints, no joint deformities, no joint instability, and no crepitation.   Pulses:  R posterior tibial normal, R dorsalis pedis normal, R carotid normal, L posterior tibial normal, L dorsalis pedis normal, and L carotid normal.   Extremities:  No clubbing, cyanosis, edema, or deformity noted with normal full range of motion of all joints.   Neurologic:  No cranial nerve deficits noted. Station and gait are normal. Plantar reflexes are down-going bilaterally. DTRs are symmetrical throughout. Sensory, motor and coordinative functions appear intact. Skin:  Intact without suspicious lesions or rashes Cervical Nodes:  No lymphadenopathy noted Psych:  Cognition and judgment appear intact. Alert and cooperative with normal attention span and concentration. No apparent delusions, illusions, hallucinations  Diabetes Management Exam:    Foot Exam (with socks and/or shoes not present):       Sensory-Pinprick/Light touch:          Left medial foot (L-4): normal          Left dorsal foot (L-5): normal          Left lateral foot (S-1): normal          Right medial foot (L-4): normal          Right dorsal foot (L-5): normal          Right lateral foot (S-1): normal       Sensory-Monofilament:          Left foot: normal          Right foot: normal       Inspection:          Left foot: normal          Right foot: normal       Nails:          Left foot: normal          Right foot: normal   Impression & Recommendations:  Problem # 1:  PREVENTIVE HEALTH CARE  (ICD-V70.0)  Reviewed preventive care protocols, scheduled due services, and updated immunizations.  Orders: EKG w/ Interpretation (93000) UA Dipstick w/o Micro (manual) (01093)  Problem # 2:  NAUSEA (ICD-787.02)  Orders: Gastroenterology Referral (GI) EKG w/ Interpretation (93000) UA Dipstick w/o Micro (manual) (23557)  Discussed symptom control.   Problem # 3:  HYPERLIPIDEMIA (ICD-272.4)  The following medications were removed from the medication list:    Pravachol 20 Mg Tabs (Pravastatin sodium) .Marland Kitchen... 1 by mouth once daily  Labs Reviewed: SGOT: 23 (01/19/2009)   SGPT: 25 (01/19/2009)   HDL:28.4 (01/19/2009), 31.0 (03/10/2007)  LDL:124 (01/19/2009), 54 (32/20/2542)  Chol:175 (01/19/2009), 118 (03/10/2007)  Trig:112 (01/19/2009), 167 (03/10/2007)  Orders: EKG w/ Interpretation (93000) UA Dipstick w/o Micro (manual) (70623)  Problem # 4:  DIABETES MELLITUS, TYPE II (ICD-250.00)  His updated medication list for this problem includes:    Metformin Hcl 1000 Mg Tabs (Metformin hcl) .Marland Kitchen... Take 1 tablet by mouth twice a day. labs and office visit due.  Orders: Ophthalmology Referral (Ophthalmology) EKG w/ Interpretation (93000) UA Dipstick w/o Micro (manual) (76283)  Labs Reviewed: Creat: 0.7 (01/19/2009)    Reviewed HgBA1c results: 6.5 (01/19/2009)  7.0 (09/15/2007)  Complete Medication List: 1)  Zoloft 100 Mg Tabs (Sertraline hcl) .... Take 1 tablet by mouth once  a day 2)  Metformin Hcl 1000 Mg Tabs (Metformin hcl) .... Take 1 tablet by mouth twice a day. labs and office visit due. 3)  Omeprazole 20 Mg Cpdr (Omeprazole) .Marland Kitchen.. 1 by mouth once daily  Other Orders: Urology Referral (Urology)  Patient Instructions: 1)  272.4 250.00 V70.0   cbcd, bmp, hep, lipid, hgba1c,microalbumin, PSA Prescriptions: OMEPRAZOLE 20 MG CPDR (OMEPRAZOLE) 1 by mouth once daily  #30 x 5   Entered and Authorized by:   [name] DO   Signed by:   [name] DO on 03/26/2010   Method  used:   Electronically to        [facility]* (retail)       [address] [place], [place]  [address]       Ph: [phone]       Fax: [phone]   RxID:   [id number] METFORMIN HCL 1000 MG TABS (METFORMIN HCL) Take 1 tablet by mouth twice a day. LABS AND OFFICE VISIT DUE.  #60 x 5   Entered and Authorized by:   [name] DO   Signed by:   [name] DO on 03/26/2010   Method used:   Electronically to        [facility] (retail)       [address] [place], [place]  [address]       Ph: [phone]       Fax: [phone]   RxID:   [id number] ZOLOFT 100 MG TABS (SERTRALINE HCL) Take 1 tablet by mouth once a day  #30 Each x 11   Entered and Authorized by:   [name] DO   Signed by:   [name] DO on 03/26/2010   Method used:   Electronically to        [facility] (retail)       [address] [place], [place]  [address]       Ph: [phone]       Fax: [phone]   RxID:   [id number]    EKG  Procedure date:  03/26/2010  Findings:      Normal sinus rhythm with rate of: 61   Left axis deviation.      Laboratory Results   Urine Tests    Routine Urinalysis   Color: yellow Appearance: Clear Glucose: 250   (Normal Range: Negative) Bilirubin: negative   (Normal Range: Negative) Ketone: negative   (Normal Range: Negative) Spec. Gravity: 1.015   (Normal Range: 1.003-1.035) Blood: negative   (Normal Range: Negative) pH: 6.5   (Normal Range: 5.0-8.0) Protein: negative   (Normal Range: Negative) Urobilinogen: 0.2   (Normal Range: 0-1) Nitrite: negative   (Normal Range: Negative) Leukocyte Esterace: negative   (Normal Range: Negative)    Comments: Army Fossa CMA  March 26, 2010 1:59 PM

## 2011-01-28 NOTE — Assessment & Plan Note (Signed)
Summary: discuss rx from foot doctor/cbs   Vital Signs:  Patient profile:   55 year old male Height:      73.5 inches Weight:      241 pounds BMI:     31.48 Temp:     97.8 degrees F oral Pulse rate:   68 / minute BP sitting:   118 / 78  (left arm)  Vitals Entered By: Jeremy Johann CMA (November 05, 2010 10:14 AM) CC: discuss rx from foot doctor, pain in joints, headache, fatigue   History of Present Illness: Pt here to discuss med from podiatry--piroxicam---it really helped his pain. Pt also struggling with flushing , no cp , no sinus congestion.  Pt never took [place] and lipitor he stopped after rx ran out.  He BS are running high.    Current Medications (verified): 1)  Zoloft 100 Mg Tabs (Sertraline Hcl) .... Take 1 Tablet By Mouth Once A Day 2)  Metformin Hcl 1000 Mg Tabs (Metformin Hcl) .... Take 1 Tablet By Mouth Twice A Day. Labs and Office Visit Due. 3)  Omeprazole 20 Mg Cpdr (Omeprazole) .Marland Kitchen.. 1 By Mouth Once Daily 4)  [name] Xr 04-999 Mg Xr24h-Tab (Saxagliptin-Metformin) .Marland Kitchen.. 1 By Mouth Once Daily 5)  Crestor 10 Mg Tabs (Rosuvastatin Calcium) .Marland Kitchen.. 1 By Mouth At Bedtime  Allergies (verified): 1)  Niaspan (Niacin (Antihyperlipidemic))  Past History:  Past Medical History: Last updated: 01/27/2007 Diabetes mellitus, type II Hyperlipidemia  Past Surgical History: Last updated: 01/19/2009 Lumbar laminectomy   Appendectomy Cholecystectomy  Family History: Last updated: 03/26/2010 Family History of Alcoholism/Addiction Family History Depression Family History of Arthritis Family History Hypertension Family History Ovarian cancer--Sister Arboriculturist MGM--stomach cancer m--copd Family History of Stroke M 1st degree relative 49 yo-- father Family History of CAD Male 1st degree relative <60  Social History: Last updated: 01/19/2009 Occupation:  [employer] Married Never Smoked Alcohol use-no Drug use-no Regular  exercise-no  Risk Factors: Alcohol Use: <1 (03/26/2010) Caffeine Use: 3 (03/26/2010) Exercise: yes (03/26/2010)  Risk Factors: Smoking Status: never (03/26/2010)  Family History: Reviewed history from 03/26/2010 and no changes required. Family History of Alcoholism/Addiction Family History Depression Family History of Arthritis Family History Hypertension Family History Ovarian cancer--Sister Arboriculturist MGM--stomach cancer m--copd Family History of Stroke M 1st degree relative 38 yo-- father Family History of CAD Male 1st degree relative <60  Social History: Reviewed history from 01/19/2009 and no changes required. Occupation:  Engineer, mining Married Never Smoked Alcohol use-no Drug use-no Regular exercise-no  Review of Systems      See HPI  Physical Exam  General:  Well-developed,well-nourished,in no acute distress; alert,appropriate and cooperative throughout examination Lungs:  Normal respiratory effort, chest expands symmetrically. Lungs are clear to auscultation, no crackles or wheezes. Heart:  Normal rate and regular rhythm. S1 and S2 normal without gallop, murmur, click, rub or other extra sounds. Extremities:  No clubbing, cyanosis, edema, or deformity noted with normal full range of motion of all joints.   Psych:  Oriented X3 and normally interactive.     Impression & Recommendations:  Problem # 1:  FATIGUE (ICD-780.79)  Orders: Venipuncture ([address]) TLB-BMP (Basic Metabolic Panel-BMET) (80048-METABOL) TLB-CBC Platelet - w/Differential (85025-CBCD) TLB-B12 + Folate Pnl ([id number]-B14/NWG) T- * Misc. Laboratory test [phone]) TLB-Microalbumin/Creat Ratio, Urine (82043-MALB)  Problem # 2:  HYPERLIPIDEMIA (ICD-272.4)  The following medications were removed from the medication list:    Lipitor 20 Mg Tabs (Atorvastatin calcium) .Marland Kitchen... 1 by mouth at bedtime. His updated medication list for  this problem includes:    Crestor 10 Mg Tabs  (Rosuvastatin calcium) .Marland Kitchen... 1 by mouth at bedtime  Orders: Venipuncture (45409) TLB-BMP (Basic Metabolic Panel-BMET) (80048-METABOL) TLB-CBC Platelet - w/Differential (85025-CBCD) TLB-B12 + Folate Pnl (81191_47829-F62/ZHY) T- * Misc. Laboratory test [phone]) TLB-Microalbumin/Creat Ratio, Urine (82043-MALB)  Problem # 3:  DIABETES MELLITUS, TYPE II (ICD-250.00)  The following medications were removed from the medication list:    [name] Xr 04-999 Mg Xr24h-tab (Saxagliptin-metformin) .Marland Kitchen... 1 by mouth daily. His updated medication list for this problem includes:    Metformin Hcl 1000 Mg Tabs (Metformin hcl) .Marland Kitchen... Take 1 tablet by mouth twice a day. labs and office visit due.    [name] Xr 04-999 Mg Xr24h-tab (Saxagliptin-metformin) .Marland Kitchen... 1 by mouth once daily  Orders: Venipuncture (46962) TLB-BMP (Basic Metabolic Panel-BMET) (80048-METABOL) TLB-CBC Platelet - w/Differential (85025-CBCD) TLB-B12 + Folate Pnl ([id number]-W10/UVO) T- * Misc. Laboratory test ([phone]) TLB-Microalbumin/Creat Ratio, Urine (82043-MALB)  Labs Reviewed: Creat: 0.8 (03/27/2010)    Reviewed HgBA1c results: 7.0 (03/27/2010)  6.5 (01/19/2009)  Problem # 4:  NECK PAIN, CHRONIC (ICD-723.1) refill pain med Discussed exercises and use of moist heat or cold and medication.   Complete Medication List: 1)  Zoloft 100 Mg Tabs (Sertraline hcl) .... Take 1 tablet by mouth once a day 2)  Metformin Hcl 1000 Mg Tabs (Metformin hcl) .... Take 1 tablet by mouth twice a day. labs and office visit due. 3)  Omeprazole 20 Mg Cpdr (Omeprazole) .Marland Kitchen.. 1 by mouth once daily 4)  [name] Xr 04-999 Mg Xr24h-tab (Saxagliptin-metformin) .Marland Kitchen.. 1 by mouth once daily 5)  Crestor 10 Mg Tabs (Rosuvastatin calcium) .Marland Kitchen.. 1 by mouth at bedtime  Other Orders: Specimen Handling (40347) T-Culture, Urine ([id number]) UA Dipstick w/o Micro (manual) (81002)  Patient Instructions: 1)  RTO 3 weeks to review  labs Prescriptions: [name] XR 04-999 MG XR24H-TAB (SAXAGLIPTIN-METFORMIN) 1 by mouth once daily  #30 x 2   Entered and Authorized by:   [name] DO   Signed by:   [name] DO on 11/05/2010   Method used:   Print then Give to Patient   RxID:   [id number] CRESTOR 10 MG TABS (ROSUVASTATIN CALCIUM) 1 by mouth at bedtime  #30 x 2   Entered and Authorized by:   [name] DO   Signed by:   [name] DO on 11/05/2010   Method used:   Print then Give to Patient   RxID:   [id number]    Orders Added: 1)  Venipuncture [20254] 2)  TLB-BMP (Basic Metabolic Panel-BMET) [80048-METABOL] 3)  TLB-CBC Platelet - w/Differential [85025-CBCD] 4)  TLB-B12 + Folate Pnl [[id number]-B12/FOL] 5)  T- * Misc. Laboratory test [99999] 6)  TLB-Microalbumin/Creat Ratio, Urine [82043-MALB] 7)  Specimen Handling [99000] 8)  T-Culture, Urine [[id number]] 9)  UA Dipstick w/o Micro (manual) [81002] 10)  Est. Patient Level IV [31517]    Laboratory Results   Urine Tests   Date/Time Reported: November 05, 2010 11:01 AM   Routine Urinalysis   Color: yellow Appearance: Clear Glucose: negative   (Normal Range: Negative) Bilirubin: negative   (Normal Range: Negative) Ketone: negative   (Normal Range: Negative) Spec. Gravity: 1.025   (Normal Range: 1.003-1.035) Blood: small   (Normal Range: Negative) pH: 5.0   (Normal Range: 5.0-8.0) Protein: negative   (Normal Range: Negative) Urobilinogen: negative   (Normal Range: 0-1) Nitrite: negative   (Normal Range: Negative) Leukocyte Esterace: negative   (Normal Range: Negative)    Comments: Floydene Flock  November 05, 2010 11:02 AM cx sent

## 2011-01-28 NOTE — Letter (Signed)
Summary: Care Consideration Regarding Eye Exam & Microalbuminuria Screeni  Care Consideration Regarding Eye Exam & Microalbuminuria Screening/[employer]   Imported By: Lanelle Bal 05/10/2010 10:40:31  _____________________________________________________________________  External Attachment:    Type:   Image     Comment:   External Document

## 2011-01-28 NOTE — Letter (Signed)
Summary: [facility]   Imported By: Lanelle Bal 04/16/2010 07:59:08  _____________________________________________________________________  External Attachment:    Type:   Image     Comment:   External Document

## 2011-01-28 NOTE — Assessment & Plan Note (Signed)
Summary: Review [facility]//KP   Vital Signs:  Patient profile:   55 year old male Weight:      241 pounds Pulse rate:   68 / minute Pulse rhythm:   regular BP sitting:   120 / 76  (right arm) Cuff size:   large  Vitals Entered By: [name] [name] Dull) (November 28, 2010 9:43 AM) CC: Review [facility]   History of Present Illness: Pt here to review labs only.  Current Medications (verified): 1)  Zoloft 100 Mg Tabs (Sertraline Hcl) .... Take 1 Tablet By Mouth Once A Day 2)  Omeprazole 20 Mg Cpdr (Omeprazole) .Marland Kitchen.. 1 By Mouth Once Daily 3)  [name] Xr 04-999 Mg Xr24h-Tab (Saxagliptin-Metformin) .Marland Kitchen.. 1 By Mouth Once Daily 4)  Crestor 20 Mg Tabs (Rosuvastatin Calcium) .Marland Kitchen.. 1 By Mouth At Bedtime 5)  Percocet 5-325 Mg Tabs (Oxycodone-Acetaminophen) .Marland Kitchen.. 1 By Mouth Every 4-6 Hours As Needed 6)  Piroxicam 20 Mg Caps (Piroxicam) .Marland Kitchen.. 1 By Mouth Once Daily 7)  Trilipix 135 Mg Cpdr (Choline Fenofibrate) .Marland Kitchen.. 1 By Mouth Once Daily  Allergies (verified): 1)  Niaspan (Niacin (Antihyperlipidemic))  Past History:  Past Medical History: Last updated: 01/27/2007 Diabetes mellitus, type II Hyperlipidemia  Past Surgical History: Last updated: 01/19/2009 Lumbar laminectomy   Appendectomy Cholecystectomy  Family History: Last updated: 03/26/2010 Family History of Alcoholism/Addiction Family History Depression Family History of Arthritis Family History Hypertension Family History Ovarian cancer--Sister Arboriculturist MGM--stomach cancer m--copd Family History of Stroke M 1st degree relative 56 yo-- father Family History of CAD Male 1st degree relative <60  Social History: Last updated: 01/19/2009 Occupation:  [employer] Married Never Smoked Alcohol use-no Drug use-no Regular exercise-no  Risk Factors: Alcohol Use: <1 (03/26/2010) Caffeine Use: 3 (03/26/2010) Exercise: yes (03/26/2010)  Risk Factors: Smoking Status: never  (03/26/2010)  Family History: Reviewed history from 03/26/2010 and no changes required. Family History of Alcoholism/Addiction Family History Depression Family History of Arthritis Family History Hypertension Family History Ovarian cancer--Sister Arboriculturist MGM--stomach cancer m--copd Family History of Stroke M 1st degree relative 86 yo-- father Family History of CAD Male 1st degree relative <60  Social History: Reviewed history from 01/19/2009 and no changes required. 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 Alert and cooperative with normal attention span and concentration. No apparent delusions, illusions, hallucinations   Impression & Recommendations:  Problem # 1:  HYPERLIPIDEMIA (ICD-272.4) see [facility]---scanned in His updated medication list for this problem includes:    Crestor 20 Mg Tabs (Rosuvastatin calcium) .Marland Kitchen... 1 by mouth at bedtime    Trilipix 135 Mg Cpdr (Choline fenofibrate) .Marland Kitchen... 1 by mouth once daily  Labs Reviewed: SGOT: 18 (03/27/2010)   SGPT: 26 (03/27/2010)   HDL:33.80 (03/27/2010), 28.4 (01/19/2009)  LDL:124 (01/19/2009), 54 (95/18/8416)  Chol:175 (03/27/2010), 175 (01/19/2009)  Trig:234.0 (03/27/2010), 112 (01/19/2009)  Problem # 2:  DIABETES MELLITUS, TYPE II (ICD-250.00) scanned in [facility] The following medications were removed from the medication list:    Metformin Hcl 1000 Mg Tabs (Metformin hcl) .Marland Kitchen... Take 1 tablet by mouth twice a day. labs and office visit due. His updated medication list for this problem includes:    [name] Xr 04-999 Mg Xr24h-tab (Saxagliptin-metformin) .Marland Kitchen... 1 by mouth once daily  Labs  Reviewed: Creat: 0.9  (11/05/2010)    Reviewed HgBA1c results: 7.0 (03/27/2010)  6.5 (01/19/2009)  Complete Medication List: 1)  Zoloft 100 Mg Tabs (Sertraline hcl) .... Take 1 tablet by mouth once a day 2)  Omeprazole 20 Mg Cpdr (Omeprazole) .Marland Kitchen.. 1 by mouth once daily 3)  [name] Xr 04-999 Mg Xr24h-tab (Saxagliptin-metformin) .Marland Kitchen.. 1 by mouth once daily 4)  Crestor 20 Mg Tabs (Rosuvastatin calcium) .Marland Kitchen.. 1 by mouth at bedtime 5)  Percocet 5-325 Mg Tabs (Oxycodone-acetaminophen) .Marland Kitchen.. 1 by mouth every 4-6 hours as needed 6)  Piroxicam 20 Mg Caps (Piroxicam) .Marland Kitchen.. 1 by mouth once daily 7)  Trilipix 135 Mg Cpdr (Choline fenofibrate) .Marland Kitchen.. 1 by mouth once daily  Patient Instructions: 1)  fasting labs in 2months-----[facility], bmp, microalbumin  250.00  272.4   Orders Added: 1)  Est. Patient Level III [[address]]

## 2011-01-28 NOTE — Progress Notes (Signed)
Summary: Referral  Phone Note Call from Patient Call back at Home Phone [phone]   Caller: Patient's Wife Reason for Call: Referral Summary of Call: Patient is having foot pain. His wife says it's either a heel spur, a bone spur or he has injuried it. They would like to see a foot specialist please.  Initial call taken by: Harold Barban,  May 30, 2010 4:27 PM  Follow-up for Phone Call        referral put in Follow-up by: [name] DO,  May 30, 2010 4:32 PM  New Problems: FOOT PAIN (ICD-729.5)   New Problems: FOOT PAIN (ICD-729.5)

## 2011-01-28 NOTE — Miscellaneous (Signed)
  Clinical Lists Changes  Orders: Added new Referral order of Gastroenterology Referral (GI) - Signed  

## 2011-01-28 NOTE — Letter (Signed)
Summary: [facility]: Immunoassay Fecal Occult Blood (iFOB) Order Form  [facility] [place]  [address] [place], [place] [address]   Phone: [phone]  Fax: [phone]      [facility]: Immunoassay Fecal Occult Blood (iFOB) Order Form   March 26, 2010 MRN: [id number]   Rajah Lapidus 24-Jul-1956   Physicican Name:_____Yvonne Lowne,DO____________________  Diagnosis Code:______V76.51____________________      Army Fossa CMA

## 2011-01-28 NOTE — Letter (Signed)
Summary: [facility]  [facility]   Imported By: Lanelle Bal 04/08/2010 14:06:20  _____________________________________________________________________  External Attachment:    Type:   Image     Comment:   External Document

## 2011-01-30 NOTE — Letter (Signed)
Summary: Generic Letter  [facility] [place]  [address] [place], [place] [address]   Phone: [phone]  Fax: [phone]    01/20/2011     [facility] [address] [place], [place]  [address]      Dear Mr. RESOR,    I have tried to reach you by telephone in reference to your most recent phone call without success. Please call the office to update your contact information. I will be available from Monday through Friday 8am-5pm.     Sincerely,   [name] CMA (AAMA)

## 2011-01-30 NOTE — Progress Notes (Signed)
Summary: Medicine Change (lmom)-- 1/16,1/17,1/18  Phone Note From Pharmacy   Caller: [facility]* Summary of Call: Handwritten note on rx refill for [name] XR 5-1000mg  tab...  Customer would like to go back to Metformin because of the price (18.67). Please advise.  Initial call taken by: Harold Barban,  January 13, 2011 10:46 AM  Follow-up for Phone Call        he needs more than just metformin---we can split it to onglyza and metformin but that would be 2 copays which may be more---  he can check with insurance to see if [name] or janumet is cheaper for him.   Follow-up by: [name] DO,  January 13, 2011 1:25 PM  Additional Follow-up for Phone Call Additional follow up Details #1::        Left message to call back.... [name] [name] Dull)  January 13, 2011 1:33 PM  Left message to call back.... [name] [name] Dull)  January 14, 2011 3:34 PM  Left message to call back... [name] [name] Dull)  January 15, 2011 3:53 PM     Additional Follow-up for Phone Call Additional follow up Details #2::    letter mailed Follow-up by: [name] [name] Dull),  January 20, 2011 10:35 AM

## 2011-01-30 NOTE — Progress Notes (Signed)
Summary: please return patients call on added cell number  Phone Note Call from Patient Call back at Work Phone [phone]   Caller: Patient Summary of Call: has been out of town; confirmed home number as correct, he added cell number to his demographics---asked you to return his call on his cell number = [phone] Initial call taken by: [name],  January 22, 2011 11:51 AM  Follow-up for Phone Call        he needs more than just metformin---we can split it to onglyza and metformin but that would be 2 copays which may be more---  he can check with insurance to see if [name] or janumet is cheaper for him.   Follow-up by: [name] DO,  January 13, 2011 1:25 PM -copied from previous note  Additional Follow-up for Phone Call Additional follow up Details #1::        spoke with patient and he stated he does not have any insurance and between his meds and his wife's meds he is spending a lot of money, stated he was out of meds and can't afford to fill them right now, wanted to knpow if there was anything that he could take that was cheaper.Marland KitchenMarland KitchenMarland KitchenI also left samples of [name] for him...please advise Additional Follow-up by: [name] [name] Dull),  January 22, 2011 2:24 PM    Additional Follow-up for Phone Call Additional follow up Details #2::    do glucophage xr 500 mg 2 by mouth once daily #60  2 refills  amaryl 2 mg #30  1 by mouth once daily ---- I need him to check bld sugars everyday two times a day and fax or call in reading in 2 weeks. Follow-up by: [name] DO,  January 22, 2011 2:40 PM  Additional Follow-up for Phone Call Additional follow up Details #3:: Details for Additional Follow-up Action Taken: pt aware of the above and he voiced understanding..... [name] [name] Dull)  January 22, 2011 3:03 PM   New/Updated Medications: [name] 500 MG XR24H-TAB (METFORMIN HCL) 2 by mouth once daily [name] 2 MG TABS (GLIMEPIRIDE) 1 by mouth once  daily Prescriptions: [name] 2 MG TABS (GLIMEPIRIDE) 1 by mouth once daily  #30 x 2   Entered by:   [name] CMA (AAMA)   Authorized by:   [name] DO   Signed by:   [name] CMA (AAMA) on 01/22/2011   Method used:   Faxed to ...       [facility]* (retail)       [address] [place], [place]  [address]       Ph: [phone]       Fax: [phone]   RxID:   [id number] [name] 500 MG XR24H-TAB (METFORMIN HCL) 2 by mouth once daily  #60 x 2   Entered by:   [name] CMA (AAMA)   Authorized by:   [name] DO   Signed by:   [name] CMA (AAMA) on 01/22/2011   Method used:   Faxed to ...       [facility]* (retail)       [address] [place], [place]  [address]       Ph: [phone]       Fax: [phone]   RxID:   [id number]

## 2011-02-27 ENCOUNTER — Other Ambulatory Visit: Payer: Self-pay

## 2011-04-05 ENCOUNTER — Other Ambulatory Visit: Payer: Self-pay | Admitting: Family Medicine

## 2011-04-07 NOTE — Telephone Encounter (Signed)
Last seen 11/28/10 and filled 02/17/11 please advise     KP

## 2011-05-13 NOTE — Discharge Summary (Signed)
[name], [name]              ACCOUNT NO.:  [id number]   MEDICAL RECORD NO.:  [id number]          PATIENT TYPE:  INP   LOCATION:  3741                         FACILITY:  [facility]   PHYSICIAN:  Valerie A. Felicity Coyer, MDDATE OF BIRTH:  [birth year]   DATE OF ADMISSION:  10/03/2008  DATE OF DISCHARGE:  10/04/2008                               DISCHARGE SUMMARY   DISCHARGE DIAGNOSES:  1. Diabetes type 2, uncontrolled.  2. Atypical chest pain.  3. Blurred vision with negative transient ischemic attack workup this      admission.  4. Bilateral lower extremity aching, rule out peripheral vascular      disease, will likely need outpatient ABIs.  We will defer this to      the patient's primary MD.  5. Remote depression.  6. Anxiety.   HISTORY OF PRESENT ILLNESS:  [name] is 55 year old male who was  admitted on October 03, 2008, through the [facility]  with chief complaints of chest pain and blurred vision.  He has a  history of diabetes and dyslipidemia.  Reportedly, stopped taking all of  his medications as he felt medications were making him not feel well.  He reports having frequent elevated blood sugars into the 400 range  prior to admission as well as overall feeling poor with frequent blurred  vision, occasional episodes of slurred speech and drooling, which had  been present for 24 hours prior to this admission as well as  intermittent chest pain.  He was admitted for further evaluation and  treatment.   COURSE OF HOSPITALIZATION:  1. Diabetes type 2.  The patient was admitted.  His sugars here have      ranged 149-183.  Hemoglobin A1c is 7.5.  We will resume Glucophage      at time of discharge.  2. Atypical chest pain.  The patient underwent serial cardiac enzymes,      which were negative.  A D-dimer was also performed, which was      within normal limits.  His chest pain has resolved.  At this time,      we have requested [facility] to arrange  an outpatient      Cardiolite in the setting of multiple risk factors.  He does,      however, deny any family history of coronary artery disease.  3. Bilateral lower extremity aching with history of uncontrolled      diabetes.  He is at risk for peripheral vascular disease.  Consider      outpatient ABIs.  We will defer this to the patient's primary MD.  4. Blurred vision on admission.  The patient underwent a TIA workup,      which included a negative MRI and MRA of the brain, a negative CT      head, 2-D echo noting a normal LV function at 60%, and bilateral      carotid duplex, which noted no significant ICA stenosis.  5. Dyslipidemia.  He was noted to have very depressed HDL with a value      of 16 and very elevated  triglycerides at 420.  He was counseled on      diabetic diet and we will resume his statin.  6. Noncompliance.  This is the patient's primary issue, overall seemed      to have poor insight into his own health care.  He was instructed      to resume medications and to continue with followup as scheduled.   DISPOSITION:  The patient will be discharged to home.   MEDICATIONS AT TIME OF DISCHARGE:  1. Enteric-coated aspirin 81 mg p.o. daily.  2. Metformin 500 mg p.o. b.i.d.  3. Pravastatin 20 mg p.o. daily.   LABORATORY DATA:  At time of discharge, triglycerides 420 and HDL 16.  Serial cardiac enzymes were negative.  BUN 13, creatinine 0.76,  hemoglobin 41, and hematocrit 41.1.   INSTRUCTIONS:  He is instructed to return to the ER should he develop  recurrent weakness, blurred vision, chest pain, or shortness of breath.  He is to call Dr. Laury Axon should he develop sugars less than 80 or greater  than 300.  He is also instructed to limit his intake of carbohydrates,  avoid concentrated sweets, and record his blood sugars twice daily.  He  is instructed to bring this record to his appointment with [facility].  He  was educated on the risks of uncontrolled diabetes.  In  addition,  [facility] is to contact him tomorrow with further instructions  about scheduling outpatient stress test within the next week.  He is  scheduled to follow up with Dr. [name] on Tuesday, October 17, 2008, at 1:45 p.m. Greater than 30 minutes spent on discharge planning.      Sandford Craze, NP      Raenette Rover. Felicity Coyer, MD  Electronically Signed    MO/MEDQ  D:  10/04/2008  T:  10/05/2008  Job:  [id number]   cc:   Lelon Perla, DO

## 2011-05-13 NOTE — H&P (Signed)
NAMEZEN, CEDILLOS              ACCOUNT NO.:  [id number]   MEDICAL RECORD NO.:  [id number]          PATIENT TYPE:  INP   LOCATION:  3741                         FACILITY:  [facility]   PHYSICIAN:  Michiel Cowboy, MDDATE OF BIRTH:  04-19-1956   DATE OF ADMISSION:  10/03/2008  DATE OF DISCHARGE:                              HISTORY & PHYSICAL   PRIMARY CARE PHYSICIAN:  Dr. Laury Axon with [place].   CHIEF COMPLAINT:  Chest pain and blurred vision.   HISTORY OF PRESENT ILLNESS:  The patient is a 55 year old gentleman with  history of diabetes, poorly controlled for which he actually stopped  taking all his medications for an unsure period of time.  Also, has  history of hyperlipidemia for which he also stopped taking his  medications..  Patient has been having frequent elevated blood sugars  into the 400 range, but actually over week blood sugars were down to  200.  He started to feel overall poor with frequent blurred vision,  occasional episodes of slurred speech and drooling on one side, for the  past 24 hours, he had been having intermittent chest pain which he  describes as a muscle like pain that is going into his shoulder.  This  has been associated with shortness of breath and occasional nausea, no  diaphoresis.  The chest pain currently is gone, but he still has this  overall discomfort.  He also has been endorsing some bilateral lower  extremity pain, but no tenderness or swelling.  He has not had any long  car rides or plane rides.  The slurred speech has been intermittent, but  he is endorsing it, but he still currently has it.  He seemed to be  going on and off for the past few days or so.  Otherwise, no fevers, no  chills.  No vomiting, but there is nausea.  No diarrhea and no  constipation.  No bleeding.   REVIEW OF SYSTEMS:  Otherwise, unremarkable except as HPI, all systems  are reviewed.   PAST MEDICAL HISTORY:  1. Hyperlipidemia.  2. Poorly controlled diabetes.  3. Remote depression.   Of note, the patient has had a hospitalization for chest pain 2006 and  was supposed to have outpatient Cardiolite.  I am not sure if he  actually had that done or not.   SOCIAL HISTORY:  The patient has remote history of tobacco abuse, but  quit 16 years ago.  Has not drank since he was a teenager.  Does not  currently use drugs.  Lives at home with wife.   FAMILY HISTORY:  Noncontributory.  Father died of COPD.   ALLERGIES:  Denies.   MEDICATIONS:  Currently does not take any   PHYSICAL EXAMINATION:  VITAL SIGNS:  Temperature 98.20, heart rate 65,  respirations 18, blood pressure 131/92, sating 100% room air.  GENERAL:  The patient appears to be older than stated age, sitting down.  HEENT:  Head:  Nontraumatic.  Cranial nerves II-XII intact.  PERRLA.  Moist mucous membranes.  NECK:  Supple.  No lymphadenopathy noted.  LUNGS: Clear to auscultation bilaterally.  HEART:  Regular rate and rhythm.  No murmurs, rubs or gallops.  ABDOMEN:.  Slightly distended but nontender.  Normal bowel sounds  present.  LOWER EXTREMITIES:  Without clubbing, cyanosis or edema.  No palpable  cords noted.  NEUROLOGICAL:  Strength 5/5 in all four extremities.  Otherwise  nonfocal.   LABORATORY DATA:  Labs white blood cell count 7.1, hemoglobin 14.5,  sodium 141, potassium 4.1, creatinine 0.8.  LFTs within normal limits.  BNP 5.8.  UA within normal limits.  Blood sugars ranging between 240-  153.  EKG showing bradycardia, but no ischemic changes.  Chest x-ray  shows mild congestion but no infiltrate.   ASSESSMENT/PLAN:  This is a 55 year old gentleman with history of  diabetes and hyperlipidemia who presents with vague chest complaints and  blurred vision and some possible drooling and slurred speech.  1. Chest pain/pressure.  Unsure if the patient actually had a      Cardiolite done, but will need.  Given risk factors, will cycle      cardiac enzymes and obtain serial EKG.   Will place on telemetry.      Further risk stratify a fasting lipid panel hemoglobin A1c.  I      doubt PE is less likely, but to be on the safe side for      completion's sake, will get D-dimer.  2. Slurred speech and occasional drooling.  Wonder if the patient has      been having TIAs versus actually CVA.  Currently, neurologically      intact, but will first obtain CT of the head without contrast to      rule out any acute bleed, although, likely.  In the a.m., will do      an MRI/MRA of the brain, and have further risk stratification with      homocysteine level and will also get a 2-D echo and carotid      Dopplers.  We will make sure the patient is on aspirin as long as      his CT scan of the head is negative.  3. Diabetes.  Will obtain hemoglobin A1c.  Put him on sliding scale.      The patient was supposed to be taking metformin, but will avoid      while in-house.  Will have a starting point now on Lantus as the      patient has had very high sugars at home.  Will have diabetes      education with the patient and the family.  4. Prophylaxis.  Protonix plus Lovenox once CT scan is negative.  5. Hyperlipidemia.  Will restart home dose of Zocor 40 which used to      be taken per H&P in 2006.  Will check fasting lipid panel in a.m.   Describe chest pain and shortness of breath and     Dr. Felicity Coyer to assume care in the morning.      Michiel Cowboy, MD  Electronically Signed     AVD/MEDQ  D:  10/03/2008  T:  10/04/2008  Job:  [id number]   cc:   Lelon Perla, DO

## 2011-05-16 NOTE — Discharge Summary (Signed)
NAMEDAVEION, ROBAR              ACCOUNT NO.:  [id number]   MEDICAL RECORD NO.:  [id number]          PATIENT TYPE:  INP   LOCATION:  1424                         FACILITY:  [facility]   PHYSICIAN:  Bruce Rexene Edison. Swords, M.D. Romualdo Bolk OF BIRTH:  1956/12/21   DATE OF ADMISSION:  12/19/2005  DATE OF DISCHARGE:  12/21/2005                                 DISCHARGE SUMMARY   DISCHARGE DIAGNOSES:  1.  Chest pain.  2.  Diabetes.  3.  Hyperlipidemia.   Other medical problems as listed on H&P.   DISCHARGE MEDICATIONS:  1.  Zocor 40 mg p.o. every day.  2.  Zoloft 200 mg p.o. every day.  3.  Metformin 500 mg b.i.d.  4.  Aspirin 325 mg p.o. every day.  5.  Ambien p.r.n.   HOSPITAL LABORATORY:  Cardiac enzymes all normal.  CBC normal on admission.  B-MET normal on admission except for a glucose of 193.   CONSULTATIONS:  Cardiology.   HOSPITAL COURSE:  The patient was admitted to the hospitalist service, on  December 20, 2005, with a feeling of chest tightness and typical flushing  sensation after taking Advicor.  The patient was seen in consultation by  cardiology due to an exertional component of his chest discomfort.  Recommendation was for outpatient Cardiolite and we will pursue that.  Hyperlipidemia.  We will change Statin medications.   FOLLOWUP PLANS:  Dr. __________  this week and also a Cardiolite to be  scheduled.      Bruce Rexene Edison Swords, M.D. [facility]  Electronically Signed     BHS/MEDQ  D:  12/21/2005  T:  12/21/2005  Job:  [id number]

## 2011-05-16 NOTE — Op Note (Signed)
NAMEGERMANY, CHELF              ACCOUNT NO.:  [id number]   MEDICAL RECORD NO.:  [id number]          PATIENT TYPE:  AMB   LOCATION:  ENDO                         FACILITY:  [facility]   PHYSICIAN:  Petra Kuba, M.D.    DATE OF BIRTH:  August 16, 1956   DATE OF PROCEDURE:  04/08/2006  DATE OF DISCHARGE:                                 OPERATIVE REPORT   PROCEDURE:  Colonoscopy.   INDICATIONS FOR PROCEDURE:  Bright red blood per rectum, change in bowel  habits, due for colonic screening. Consent was signed after risks, benefits,  methods, and options were thoroughly discussed in the office.   MEDICINES USED:  Fentanyl 75 mcg, Versed 6 mg.   DESCRIPTION OF PROCEDURE:  Rectal inspection was pertinent for external  hemorrhoids, small. Digital exam was negative. The video colonoscope was  inserted, easily advanced to the level of the ileocecal valve and  unfortunately at that point there was looping and we had to roll him on his  back with abdominal pressure to advance to the cecum which was identified by  the appendiceal orifice and the ileocecal valve. The prep was fairly  adequate. A moderate amount of washing and suctioning was done and still  could not get all the stool out. On insertion and slow withdrawal, other  than a tiny mid transverse polyp which was cold biopsied x2 no other  abnormalities were seen as we slowly withdrew back to the rectum. Anal  rectal pullthrough and retroflexion confirmed some small hemorrhoids. The  scope was straightened and readvanced a short ways up the left side of the  colon, air was suctioned, scope removed. The patient tolerated the procedure  well. There was no obvious immediate complication.   ENDOSCOPIC DIAGNOSIS:  1.  Internal and external small hemorrhoids.  2.  Tiny mid transverse polyp cold biopsied.  3.  Otherwise within normal limits to the cecum.   PLAN:  Await pathology but probably recheck colon screening in 5 years.  Happy to see back  p.r.n. otherwise return care to Dr. Laury Axon for the  customary health care screening and maintenance to include yearly rectals  and guaiacs.           ______________________________  Petra Kuba, M.D.     MEM/MEDQ  D:  04/08/2006  T:  04/08/2006  Job:  [id number]   cc:   [name], M.D.  [address] [place]  [place] [address]

## 2011-05-16 NOTE — H&P (Signed)
NAMEDORANCE, SPINK              ACCOUNT NO.:  [id number]   MEDICAL RECORD NO.:  [id number]          PATIENT TYPE:  INP   LOCATION:  1424                         FACILITY:  [facility]   PHYSICIAN:  Bruce Rexene Edison. Swords, M.D. [facility] OF BIRTH:  1956-08-22   DATE OF ADMISSION:  12/19/2005  DATE OF DISCHARGE:                                HISTORY & PHYSICAL   CHIEF COMPLAINT:  Felt hot.   HISTORY OF PRESENT ILLNESS:  Ronald Petersen is a 55 year old male who felt  unwell after taking his first dose of Advicor.  He developed a hot  sensation, followed by tingling and burning all over.  At the same time, he  felt some chest discomfort.  He has not had any previous similar symptoms  after he took the Advicor except he has had some chest tightness before.  He  describes several episodes of exertional chest discomfort.  He states most  of the time his chest discomfort happens with exertion, occasionally at  rest.  This has been happening over several months.  The pain is not  necessarily recurrent every time he exercises.  He denies any associated  nausea, vomiting, or diaphoresis.   PAST MEDICAL HISTORY:  1.  Type 2 diabetes.  2.  Hyperlipidemia.  3.  Appendectomy.  4.  History of kidney stones.  5.  Back surgery.   MEDICATIONS:  1.  He started taking Advicor.  2.  He takes Metformin 500 mg p.o. b.i.d.  3.  Zoloft 50 mg p.o. every day.  4.  Ambien at night.   He has no known drug allergies.   SOCIAL HISTORY:  The patient is not working.  He is married.  He has two  healthy children.  He quit smoking 12 years ago.   FAMILY HISTORY:  Father is in excellent health.  Mother deceased with COPD.  Sister deceased with ovarian cancer.   REVIEW OF SYSTEMS:  The patient has had recurrent epigastric pain, etiology  unknown.  Apparently, he had a recent abdominal ultrasound, results unknown  other than he states he had some kidney stones.  He has had some associated  flank discomfort.  He denies  any other complaints in the review of systems.   PHYSICAL EXAMINATION:  VITAL SIGNS:  The patient is afebrile.  Sat is 96% on  room air.  Respirations 18.  Heart rate 64.  Blood pressure 129/70.  GENERAL:  This is a well-developed, well-nourished, morbidly obese male in  no acute distress.  HEENT:  Atraumatic, normocephalic.  Extraocular muscles are intact.  NECK:  Supple without lymphadenopathy, thyromegaly, jugular venous  distention, or carotid bruits.  CHEST:  Clear to auscultation without any increased work of breathing.  CARDIAC:  S1 and S2 are normal without murmurs or gallops.  ABDOMEN:  Obese, active bowel sounds, soft, nontender.  There is no  hepatosplenomegaly.  EXTREMITIES:  There is no clubbing, cyanosis, or edema.  NEUROLOGIC:  He is alert and oriented.  He has no motor or sensory deficits.  RECTAL:  Not performed.   ASSESSMENT:  1.  Reaction to niacin.  2.  Chest pain.  3.  Diabetes.  4.  Hyperlipidemia.   Other medical problems as listed above.   LABORATORY:  UA is negative.  CBC is normal.  Cardiac markers are normal.  Glucose 193, creatinine 0.9.   I am told by the emergency room physician that the EKG is normal.  It is not  currently available for review.  I will ask nursing staff to recover the  EKG.   PLAN:  1.  Discontinue Advicor, start Zocor.  2.  Chest pain.  Gives a history of chest discomfort.  It is certainly more      concerning than his reaction to Advicor.  He has had some exertional      component to his chest discomfort and also some rest component.  He has      got multiple risk factors for coronary artery disease and he clearly      needs further evaluation.  I will ask cardiology to see the patient.      The patient should probably have an angiogram.  Noninvasive stress      testing, I do not think would be adequate under this situation.  We will      start the patient on aspirin, change Advicor to Zocor, allow the patient      to  ambulate but we will put him on DVT prophylaxis dose of Lovenox.      Bruce Rexene Edison Swords, M.D. [facility]  Electronically Signed     BHS/MEDQ  D:  12/20/2005  T:  12/20/2005  Job:  [id number]   cc:   [name], M.D.  [phone]. [address] [place]  [place] [address]   [employer]

## 2011-05-16 NOTE — Consult Note (Signed)
Ronald Petersen, Ronald Petersen              ACCOUNT NO.:  [id number]   MEDICAL RECORD NO.:  [id number]          PATIENT TYPE:  INP   LOCATION:  1424                         FACILITY:  [facility]   PHYSICIAN:  Charlton Haws, M.D.     DATE OF BIRTH:  1956-09-28   DATE OF CONSULTATION:  12/20/2005  DATE OF DISCHARGE:                                   CONSULTATION   Ronald Petersen is a 55 year old patient of Dr. [name]. He has had somewhat  poor medical follow up over the last year. He has been seeing Dr. Laury Axon over  the last week or two. She recently diagnosed him with diabetes and started  him on Metformin. She also diagnosed him with hypercholesterolemia and  started him on niacin. He had been on niacin x4 days. After taking a pill  last night he had a typical niacin reaction with flushing and tightness in  the neck going up to his head and then down his chest. Previous to this he  has not had any significant chest pain. He had occasionally gotten  exertional indigestion.   PAST MEDICAL HISTORY:  1.  Otherwise remarkable for appendectomy.  2.  Lower back problems.  3.  He also has some cervical back problems.   He has never had a stress-test and no documented coronary artery disease.   ALLERGIES:  The patient has no known allergies but is now intolerant to  NIACIN.   MEDICATIONS:  Avecor, Metformin, Zoloft and Ambien.   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 He is not working right now. He has two grown kids that are healthy. He  quit smoking 12 years ago.  His wife was with him in the room and they seem to have a good relationship.   Review of systems otherwise unremarkable. He was also diagnosed recently  with a kidney stone.   PHYSICAL EXAMINATION:  GENERAL:  He is obese.  VITAL SIGNS:  Blood pressure is 130/70, pulse 70, regular.  LUNGS:  Clear.  CAROTIDS:  Normal.  CARDIOVASCULAR:  There is an S1, S2 with normal heart sounds.  ABDOMEN:   Benign, no incisions.  EXTREMITIES:  Lower extremities have intact pulses, no edema.  SKIN:  The flushing in his face is now gone.   His telemetry is normal. I do not have a copy of his EKG. He was just moved  up from the ER, I will have to look at this.   His point of care initial enzymes are negative. His lab work is otherwise  unremarkable.   IMPRESSION:  I will have to check the patient's EKG, it was not commented on  in Dr. Cato Mulligan' note and I will probably have to pull it up on ____.  If this  is not showing any significant changes I think the patient can be monitored  for 24 hours. He certainly could have coronary disease. However the impetus  for admission is clearly what sounds like a niacin reaction.   Would rule the patient out, monitor him, continue treatment of  his  hypercholesterolemia with Avecor and tight diabetes control with Metformin  and follow up hemoglobin A1C and then have an outpatient Myoview to further  assess his history of coronary artery disease.   Since the patient has had a lot of medication started recently, I am not  sure I would necessarily start a beta blocker on him and he would be  discharged with nitroglycerin in case he gets exertional chest pain. Ideally  he would have his outpatient Myoview sometime between the Christmas and New  Year holiday.           ______________________________  Charlton Haws, M.D.     PN/MEDQ  D:  12/20/2005  T:  12/20/2005  Job:  [id number]

## 2011-05-16 NOTE — Op Note (Signed)
Ronald Petersen, Ronald Petersen              ACCOUNT NO.:  [id number]   MEDICAL RECORD NO.:  [id number]          PATIENT TYPE:  AMB   LOCATION:  DAY                          FACILITY:  [facility]   PHYSICIAN:  Gita Kudo, M.D. DATE OF BIRTH:  February 07, 1956   DATE OF PROCEDURE:  02/06/2006  DATE OF DISCHARGE:                                 OPERATIVE REPORT   OPERATIVE PROCEDURE:  Laparoscopic cholecystectomy with intraoperative  cholangiogram.   SURGEON:  Gita Kudo, M.D.   ASSISTANT:  Leonie Man, M.D.   ANESTHESIA:  General endotracheal.   PREOPERATIVE DIAGNOSES:  1.  Abnormal gallbladder.  2.  Cholecystitis.  3.  Biliary sludge.   POSTOP DIAGNOSES:  1.  Abnormal gallbladder.  2.  Cholecystitis.  3.  Biliary sludge.  4.  Normal cholangiogram.   CLINICAL SUMMARY:  A 55 year old male with abdominal pain and known right  upper quadrant renal stones, had ultrasound showing sludge. His liver  function studies are normal and he comes in for an elective cholecystectomy.  He has several medical problems that are well controlled including diabetes.   OPERATIVE FINDINGS:  The gallbladder was thin-walled. Cystic duct and artery  normal in anatomy. Cholangiogram looked normal.   OPERATIVE PROCEDURE:  Under satisfactory general endotracheal anesthesia,  the patient was positioned, prepped and draped in a standard fashion. He  received 1.0 gm Ancef preop and 30 mL of 0.5% Marcaine with epinephrine  during the procedure at his skin sites for postop analgesia. A transverse  incision made above the umbilicus and midline opened into the peritoneum.  Controlled with a figure-of-eight #0 Vicryl suture and operating Hassan port  inserted. Good CO2 pneumoperitoneum established and camera placed.   Under direct vision, two #5 ports placed laterally and a second #10  medially. Operating through the medial port, with lateral graspers giving  excellent exposure, we circumferentially dissected  the cystic duct and  artery; and when certain of the anatomy, a single clip placed on the duct  near the gallbladder, incision made, and percutaneous catheter passed into  it. Good cholangiogram obtained, catheter withdrawn; and then the duct had  multiple clips placed distally, and multiple clips also placed on the cystic  artery. Each of these structures was transected; and then the gallbladder  removed from below upward using coagulating current for hemostasis and  dissection. Small hole made in the gallbladder during the dissection and  clear bile came out but no stones.   The dissection was continued until the gallbladder was removed from the  liver bed. The liver bed was made hemostatic by cautery, lavaged with  saline, and suctioned dry. An EndoCatch bag placed into the upper port; and  then the camera was moved from the umbilicus up to the upper port and the  grasper used to retrieve the gallbladder and the pouch, intact, and without  spillage or problem. Operative site again checked, lavaged, and suctioned  dry and then the wounds closed. The midline closed with the previous figure-  of-eight and the second interrupted #0 Vicryl followed by 4-0 Vicryl subcu  sutures; and then Steri-Strips  for skin. Sterile absorbent dressings were  applied, and the patient went to the recovery room from the operating room  in good condition.           ______________________________  Gita Kudo, M.D.     MRL/MEDQ  D:  02/06/2006  T:  02/06/2006  Job:  [id number]   cc:   Vania Rea. Jarold Motto, M.D. [facility]  [address]  [place]  [place] [address]   [name], M.D.  [phone] [place]  [place] [address]

## 2011-08-07 ENCOUNTER — Other Ambulatory Visit: Payer: Self-pay | Admitting: Family Medicine

## 2011-09-30 LAB — COMPREHENSIVE METABOLIC PANEL
ALT: 21
ALT: 22
AST: 18
AST: 23
Albumin: 3.4 — ABNORMAL LOW
Alkaline Phosphatase: 114
Alkaline Phosphatase: 88
CO2: 29
Calcium: 8.6
GFR calc Af Amer: >60
GFR calc Af Amer: >60
GFR calc non Af Amer: >60
Glucose, Bld: 190 — ABNORMAL HIGH
Glucose, Bld: 224 — ABNORMAL HIGH
Potassium: 3.7
Potassium: 4.1
Sodium: 137
Sodium: 141
Total Bilirubin: 0.5
Total Protein: 6

## 2011-09-30 LAB — GLUCOSE, CAPILLARY
Glucose-Capillary: 153 — ABNORMAL HIGH
Glucose-Capillary: 183 — ABNORMAL HIGH
Glucose-Capillary: 183 — ABNORMAL HIGH

## 2011-09-30 LAB — POCT CARDIAC MARKERS
CKMB, poc: 1.1
CKMB, poc: 1.7
Troponin i, poc: <0.05

## 2011-09-30 LAB — CBC
Hemoglobin: 14.1
MCV: 81.4
Platelets: 212
RDW: 11.9
RDW: 12.8

## 2011-09-30 LAB — D-DIMER, QUANTITATIVE: D-Dimer, Quant: 0.33

## 2011-09-30 LAB — LIPID PANEL
HDL: 16 — ABNORMAL LOW
Total CHOL/HDL Ratio: 10.7
Triglycerides: 420 — ABNORMAL HIGH
VLDL: UNDETERMINED

## 2011-09-30 LAB — POCT B-TYPE NATRIURETIC PEPTIDE (BNP): B Natriuretic Peptide, POC: 5.8

## 2011-09-30 LAB — CARDIAC PANEL(CRET KIN+CKTOT+MB+TROPI)
CK, MB: 1.5
CK, MB: 1.6
CK, MB: 1.7
Relative Index: INVALID
Total CK: 84
Total CK: 95
Troponin I: <0.01

## 2011-09-30 LAB — DIFFERENTIAL
Basophils Absolute: 0.1
Eosinophils Relative: 3
Monocytes Absolute: 0.6
Monocytes Relative: 8
Neutrophils Relative %: 49

## 2011-09-30 LAB — TSH: TSH: 1.651

## 2011-09-30 LAB — URINALYSIS, ROUTINE W REFLEX MICROSCOPIC
Glucose, UA: >1000 — AB
Hgb urine dipstick: NEGATIVE
Leukocytes, UA: NEGATIVE
Protein, ur: NEGATIVE

## 2011-09-30 LAB — URINE MICROSCOPIC-ADD ON

## 2011-10-09 LAB — URINALYSIS, ROUTINE W REFLEX MICROSCOPIC
Bilirubin Urine: NEGATIVE
Nitrite: NEGATIVE
Specific Gravity, Urine: 1.025
Urobilinogen, UA: 1
pH: 5.5

## 2012-01-05 ENCOUNTER — Other Ambulatory Visit: Payer: Self-pay | Admitting: Family Medicine

## 2012-03-30 ENCOUNTER — Telehealth: Payer: Self-pay | Admitting: Family Medicine

## 2012-03-30 NOTE — Telephone Encounter (Signed)
Called patient to schedule 15-minute f/u to qualify for Parking placard. LMOM, per KP/smc

## 2012-04-01 ENCOUNTER — Ambulatory Visit (INDEPENDENT_AMBULATORY_CARE_PROVIDER_SITE_OTHER): Payer: Self-pay | Admitting: Family Medicine

## 2012-04-01 ENCOUNTER — Encounter: Payer: Self-pay | Admitting: Family Medicine

## 2012-04-01 VITALS — BP 116/74 | HR 71 | Temp 98.2°F | Ht 72.0 in | Wt 236.2 lb

## 2012-04-01 DIAGNOSIS — F329 Major depressive disorder, single episode, unspecified: Secondary | ICD-10-CM

## 2012-04-01 DIAGNOSIS — E785 Hyperlipidemia, unspecified: Secondary | ICD-10-CM

## 2012-04-01 DIAGNOSIS — E1142 Type 2 diabetes mellitus with diabetic polyneuropathy: Secondary | ICD-10-CM

## 2012-04-01 DIAGNOSIS — K219 Gastro-esophageal reflux disease without esophagitis: Secondary | ICD-10-CM

## 2012-04-01 DIAGNOSIS — G629 Polyneuropathy, unspecified: Secondary | ICD-10-CM | POA: Insufficient documentation

## 2012-04-01 DIAGNOSIS — E1149 Type 2 diabetes mellitus with other diabetic neurological complication: Secondary | ICD-10-CM

## 2012-04-01 DIAGNOSIS — E1140 Type 2 diabetes mellitus with diabetic neuropathy, unspecified: Secondary | ICD-10-CM

## 2012-04-01 DIAGNOSIS — E119 Type 2 diabetes mellitus without complications: Secondary | ICD-10-CM

## 2012-04-01 DIAGNOSIS — G609 Hereditary and idiopathic neuropathy, unspecified: Secondary | ICD-10-CM

## 2012-04-01 DIAGNOSIS — R52 Pain, unspecified: Secondary | ICD-10-CM

## 2012-04-01 DIAGNOSIS — F3289 Other specified depressive episodes: Secondary | ICD-10-CM

## 2012-04-01 DIAGNOSIS — Z0000 Encounter for general adult medical examination without abnormal findings: Secondary | ICD-10-CM

## 2012-04-01 LAB — POCT URINALYSIS DIPSTICK
Blood, UA: NEGATIVE
Protein, UA: NEGATIVE
Spec Grav, UA: 1.02
Urobilinogen, UA: 0.2
pH, UA: 5

## 2012-04-01 LAB — HEPATIC FUNCTION PANEL
AST: 19 U/L (ref 0–37)
Alkaline Phosphatase: 82 U/L (ref 39–117)
Bilirubin, Direct: 0.1 mg/dL (ref 0.0–0.3)

## 2012-04-01 LAB — MICROALBUMIN / CREATININE URINE RATIO
Creatinine,U: 90.4 mg/dL
Microalb Creat Ratio: 0.9 mg/g (ref 0.0–30.0)
Microalb, Ur: 0.8 mg/dL (ref 0.0–1.9)

## 2012-04-01 LAB — BASIC METABOLIC PANEL
Calcium: 9.2 mg/dL (ref 8.4–10.5)
GFR: 103.01 mL/min (ref >60.00)
Glucose, Bld: 229 mg/dL — ABNORMAL HIGH (ref 70–99)
Potassium: 4.3 mEq/L (ref 3.5–5.1)
Sodium: 136 mEq/L (ref 135–145)

## 2012-04-01 LAB — CBC WITH DIFFERENTIAL/PLATELET
Basophils Absolute: 0 10*3/uL (ref 0.0–0.1)
Eosinophils Relative: 2.7 % (ref 0.0–5.0)
HCT: 46.8 % (ref 39.0–52.0)
Hemoglobin: 16 g/dL (ref 13.0–17.0)
Lymphocytes Relative: 40.1 % (ref 12.0–46.0)
Monocytes Relative: 7.9 % (ref 3.0–12.0)
Neutro Abs: 2.9 10*3/uL (ref 1.4–7.7)
Platelets: 179 10*3/uL (ref 150.0–400.0)
RDW: 13.2 % (ref 11.5–14.6)
WBC: 5.9 10*3/uL (ref 4.5–10.5)

## 2012-04-01 LAB — HEMOGLOBIN A1C: Hgb A1c MFr Bld: 9.8 % — ABNORMAL HIGH (ref 4.6–6.5)

## 2012-04-01 LAB — LDL CHOLESTEROL, DIRECT: Direct LDL: 100.4 mg/dL

## 2012-04-01 LAB — LIPID PANEL
HDL: 31.8 mg/dL — ABNORMAL LOW (ref >39.00)
Total CHOL/HDL Ratio: 6
VLDL: 85.2 mg/dL — ABNORMAL HIGH (ref 0.0–40.0)

## 2012-04-01 MED ORDER — METFORMIN HCL ER 500 MG PO TB24: 1000.0000 mg | ORAL_TABLET | Freq: Every day | ORAL | Status: DC

## 2012-04-01 MED ORDER — GABAPENTIN 100 MG PO CAPS: 100.0000 mg | ORAL_CAPSULE | Freq: Three times a day (TID) | ORAL | Status: DC

## 2012-04-01 MED ORDER — OXYCODONE-ACETAMINOPHEN 5-325 MG PO TABS: 1.0000 | ORAL_TABLET | ORAL | Status: DC | PRN

## 2012-04-01 MED ORDER — SERTRALINE HCL 100 MG PO TABS: ORAL_TABLET | ORAL | Status: DC

## 2012-04-01 MED ORDER — ATORVASTATIN CALCIUM 20 MG PO TABS: 20.0000 mg | ORAL_TABLET | Freq: Every day | ORAL | Status: DC

## 2012-04-01 NOTE — Assessment & Plan Note (Signed)
gabepentin Check labs

## 2012-04-01 NOTE — Patient Instructions (Signed)

## 2012-04-01 NOTE — Assessment & Plan Note (Signed)
Check labs Start lipitor

## 2012-04-01 NOTE — Progress Notes (Deleted)
  Subjective:    Patient ID: Ronald Petersen, male    DOB: October 27, 1956, 56 y.o.   MRN: [id number]  HPI   Review of Systems     Objective:   Physical Exam        Assessment & Plan:

## 2012-04-01 NOTE — Progress Notes (Signed)
Subjective:     Ronald Petersen is a 55 y.o. male who presents f/u DMII. Current symptoms include: numbness and pain in legs . Patient denies any other symptoms--he has not been check BS. Marland Kitchen Home sugars: not checking. Current treatments: he stopped all his meds but metformin and zoloft. Last dilated eye exam due..  Past Medical History  Diagnosis Date  . Hyperlipidemia   . Depression   . Diabetes mellitus    History   Social History  . Marital Status: Married    Spouse Name: N/A    Number of Children: N/A  . Years of Education: N/A   Occupational History  . Not on file.   Social History Main Topics  . Smoking status: Never Smoker   . Smokeless tobacco: Never Used  . Alcohol Use: No  . Drug Use: No  . Sexually Active: Not on file   Other Topics Concern  . Not on file   Social History Narrative  . No narrative on file   Family History  Problem Relation Age of Onset  . Alcohol abuse    . Depression    . Arthritis    . Hypertension    . Ovarian cancer Sister   . Stomach cancer Sister   . Stomach cancer Maternal Grandmother   . COPD Mother   . Stroke Father   . Coronary artery disease       Review of Systems As above   Objective:    BP 116/74  Pulse 71  Temp(Src) 98.2 F (36.8 C) (Oral)  Ht 6' (1.829 m)  Wt 236 lb 3.2 oz (107.14 kg)  BMI 32.03 kg/m2  SpO2 98%  General Appearance:    Alert, cooperative, no distress, appears stated age  Head:    Normocephalic, without obvious abnormality, atraumatic  Eyes:    PERRL, conjunctiva/corneas clear, EOM's intact, fundi    benign, both eyes       Ears:    Normal TM's and external ear canals, both ears  Nose:   Nares normal, septum midline, mucosa normal, no drainage   or sinus tenderness  Throat:   Lips, mucosa, and tongue normal; teeth and gums normal  Neck:   Supple, symmetrical, trachea midline, no adenopathy;       thyroid:  No enlargement/tenderness/nodules; no carotid   bruit or JVD  Back:      Symmetric, no curvature, ROM normal, no CVA tenderness  Lungs:     Clear to auscultation bilaterally, respirations unlabored  Chest wall:    No tenderness or deformity  Heart:    Regular rate and rhythm, S1 and S2 normal, no murmur, rub   or gallop  Abdomen:     Soft, non-tender, bowel sounds active all four quadrants,    no masses, no organomegaly  Genitalia:    na  Rectal:    na  Extremities:   Extremities normal, atraumatic, no cyanosis or edema  Pulses:   2+ and symmetric all extremities  Skin:   Skin color, texture, turgor normal, no rashes or lesions  Lymph nodes:   Cervical, supraclavicular, and axillary nodes normal  Neurologic:  Sensory exam of the foot is normal, tested with the monofilament. Good pulses, no lesions or ulcers, good peripheral pulses.    Sensory exam of the foot is normal, tested with the monofilament. Good pulses, no lesions or ulcers, good peripheral pulses.    Laboratory: No components found with this basename: A1C      Assessment:  DMII Hyperlipidemia PN   Plan:    see AVS

## 2012-04-01 NOTE — Assessment & Plan Note (Signed)
Noncompliant with meds secondary to cost con't glucophage Check labs

## 2012-04-09 DIAGNOSIS — E785 Hyperlipidemia, unspecified: Secondary | ICD-10-CM

## 2012-04-09 MED ORDER — CHOLINE FENOFIBRATE 135 MG PO CPDR: 135.0000 mg | DELAYED_RELEASE_CAPSULE | Freq: Every day | ORAL | Status: DC

## 2012-04-09 MED ORDER — GLIMEPIRIDE 2 MG PO TABS: 2.0000 mg | ORAL_TABLET | Freq: Every day | ORAL | Status: DC

## 2012-04-15 ENCOUNTER — Telehealth: Payer: Self-pay | Admitting: Family Medicine

## 2012-04-15 NOTE — Telephone Encounter (Signed)
Ronald Petersen from [facility] called to let Dr. Laury Axon know that the pt is no longer responding to them or ordering his his diabetic supplies. [facility] has put his account on hold and has stopped calling the patient.

## 2012-04-15 NOTE — Telephone Encounter (Signed)
Pt had recent appt w/ Dr Laury Axon and has f/u scheduled.  She will address this at f/u.

## 2012-04-19 ENCOUNTER — Other Ambulatory Visit: Payer: Self-pay | Admitting: *Deleted

## 2012-04-19 NOTE — Telephone Encounter (Signed)
Called pt to ask if she is still using the [employer] testing strips per incoming company fax stating that they are unable to reach the pt, .left message to have patient return my call.

## 2012-04-27 NOTE — Telephone Encounter (Signed)
Called pt and was advised to contact cell number [phone], left vm on mobile to call office to clarify if he is still using the freestyle test strips, placed new number in demographics, faxed form back to [employer] company to update pt alternate phone number as well.

## 2012-05-13 ENCOUNTER — Other Ambulatory Visit: Payer: Self-pay

## 2012-05-13 ENCOUNTER — Telehealth: Payer: Self-pay

## 2012-05-13 DIAGNOSIS — E785 Hyperlipidemia, unspecified: Secondary | ICD-10-CM

## 2012-05-13 MED ORDER — GLIMEPIRIDE 2 MG PO TABS: 2.0000 mg | ORAL_TABLET | Freq: Every day | ORAL | Status: DC

## 2012-05-13 MED ORDER — CHOLINE FENOFIBRATE 135 MG PO CPDR: 135.0000 mg | DELAYED_RELEASE_CAPSULE | Freq: Every day | ORAL | Status: DC

## 2012-05-13 NOTE — Telephone Encounter (Signed)
Chol and dm not controlled------ needs amaryl as well---- 2 mg 1 po qd #30 2 refills And fenofibrate needs to be refilled.  Recheck 3 months------lipid, hep, bmp, hgba1c, microalbumin 250.00 272.4   Discussed with patient and wife and they voiced understanding. She stated he has a hard time getting him to eat right,  Advised that patient will need to try harder. Rx faxed and they will call back to schedule 3 mo follow up.        KP

## 2012-05-31 ENCOUNTER — Encounter: Payer: Self-pay | Admitting: Family Medicine

## 2012-06-08 ENCOUNTER — Encounter: Payer: Self-pay | Admitting: Family Medicine

## 2012-08-09 ENCOUNTER — Encounter: Payer: Self-pay | Admitting: Family Medicine

## 2012-10-25 ENCOUNTER — Encounter: Payer: Self-pay | Admitting: Family Medicine

## 2012-10-25 ENCOUNTER — Ambulatory Visit (INDEPENDENT_AMBULATORY_CARE_PROVIDER_SITE_OTHER): Payer: Self-pay | Admitting: Family Medicine

## 2012-10-25 VITALS — BP 112/80 | HR 64 | Temp 98.2°F | Ht 71.0 in | Wt 234.6 lb

## 2012-10-25 DIAGNOSIS — E119 Type 2 diabetes mellitus without complications: Secondary | ICD-10-CM

## 2012-10-25 DIAGNOSIS — E1149 Type 2 diabetes mellitus with other diabetic neurological complication: Secondary | ICD-10-CM

## 2012-10-25 DIAGNOSIS — E785 Hyperlipidemia, unspecified: Secondary | ICD-10-CM

## 2012-10-25 DIAGNOSIS — R52 Pain, unspecified: Secondary | ICD-10-CM

## 2012-10-25 DIAGNOSIS — E1142 Type 2 diabetes mellitus with diabetic polyneuropathy: Secondary | ICD-10-CM

## 2012-10-25 DIAGNOSIS — E1140 Type 2 diabetes mellitus with diabetic neuropathy, unspecified: Secondary | ICD-10-CM

## 2012-10-25 DIAGNOSIS — Z0000 Encounter for general adult medical examination without abnormal findings: Secondary | ICD-10-CM

## 2012-10-25 DIAGNOSIS — R413 Other amnesia: Secondary | ICD-10-CM

## 2012-10-25 LAB — BASIC METABOLIC PANEL
BUN: 17 mg/dL (ref 6–23)
CO2: 28 mEq/L (ref 19–32)
Chloride: 100 mEq/L (ref 96–112)
Creatinine, Ser: 0.8 mg/dL (ref 0.4–1.5)
Glucose, Bld: 174 mg/dL — ABNORMAL HIGH (ref 70–99)

## 2012-10-25 LAB — HEPATIC FUNCTION PANEL
Albumin: 4 g/dL (ref 3.5–5.2)
Bilirubin, Direct: 0.2 mg/dL (ref 0.0–0.3)
Total Protein: 7.3 g/dL (ref 6.0–8.3)

## 2012-10-25 LAB — POCT URINALYSIS DIPSTICK
Bilirubin, UA: NEGATIVE
Ketones, UA: NEGATIVE
Leukocytes, UA: NEGATIVE

## 2012-10-25 LAB — LIPID PANEL
HDL: 32.3 mg/dL — ABNORMAL LOW (ref >39.00)
Triglycerides: 309 mg/dL — ABNORMAL HIGH (ref 0.0–149.0)

## 2012-10-25 LAB — VITAMIN B12: Vitamin B-12: 374 pg/mL (ref 211–911)

## 2012-10-25 LAB — CBC WITH DIFFERENTIAL/PLATELET
Basophils Relative: 0.6 % (ref 0.0–3.0)
Eosinophils Absolute: 0.1 10*3/uL (ref 0.0–0.7)
MCHC: 33.7 g/dL (ref 30.0–36.0)
MCV: 86.4 fl (ref 78.0–100.0)
Monocytes Absolute: 0.6 10*3/uL (ref 0.1–1.0)
Neutrophils Relative %: 64.9 % (ref 43.0–77.0)
RBC: 5.42 Mil/uL (ref 4.22–5.81)
RDW: 13.2 % (ref 11.5–14.6)

## 2012-10-25 LAB — PSA: PSA: 0.97 ng/mL (ref 0.10–4.00)

## 2012-10-25 LAB — LDL CHOLESTEROL, DIRECT: Direct LDL: 149.2 mg/dL

## 2012-10-25 MED ORDER — OXYCODONE-ACETAMINOPHEN 5-325 MG PO TABS: 1.0000 | ORAL_TABLET | ORAL | Status: DC | PRN

## 2012-10-25 MED ORDER — ATORVASTATIN CALCIUM 20 MG PO TABS: 20.0000 mg | ORAL_TABLET | Freq: Every day | ORAL | Status: DC

## 2012-10-25 MED ORDER — PIROXICAM 20 MG PO CAPS: 20.0000 mg | ORAL_CAPSULE | Freq: Every day | ORAL | Status: DC

## 2012-10-25 MED ORDER — METFORMIN HCL ER 500 MG PO TB24: 1000.0000 mg | ORAL_TABLET | Freq: Every day | ORAL | Status: DC

## 2012-10-25 MED ORDER — LIRAGLUTIDE 18 MG/3ML ~~LOC~~ SOLN: SUBCUTANEOUS | Status: DC

## 2012-10-25 NOTE — Assessment & Plan Note (Signed)
Discussed importance of medication ----pt will take med Check labs

## 2012-10-25 NOTE — Patient Instructions (Signed)
Preventive Care for Adults, Male A healthy lifestyle and preventive care can promote health and wellness. Preventive health guidelines for men include the following key practices:  A routine yearly physical is a good way to check with your caregiver about your health and preventative screening. It is a chance to share any concerns and updates on your health, and to receive a thorough exam.  Visit your dentist for a routine exam and preventative care every 6 months. Brush your teeth twice a day and floss once a day. Good oral hygiene prevents tooth decay and gum disease.  The frequency of eye exams is based on your age, health, family medical history, use of contact lenses, and other factors. Follow your caregiver's recommendations for frequency of eye exams.  Eat a healthy diet. Foods like vegetables, fruits, whole grains, low-fat dairy products, and lean protein foods contain the nutrients you need without too many calories. Decrease your intake of foods high in solid fats, added sugars, and salt. Eat the right amount of calories for you.Get information about a proper diet from your caregiver, if necessary.  Regular physical exercise is one of the most important things you can do for your health. Most adults should get at least 150 minutes of moderate-intensity exercise (any activity that increases your heart rate and causes you to sweat) each week. In addition, most adults need muscle-strengthening exercises on 2 or more days a week.  Maintain a healthy weight. The body mass index (BMI) is a screening tool to identify possible weight problems. It provides an estimate of body fat based on height and weight. Your caregiver can help determine your BMI, and can help you achieve or maintain a healthy weight.For adults 20 years and older:  A BMI below 18.5 is considered underweight.  A BMI of 18.5 to 24.9 is normal.  A BMI of 25 to 29.9 is considered overweight.  A BMI of 30 and above is  considered obese.  Maintain normal blood lipids and cholesterol levels by exercising and minimizing your intake of saturated fat. Eat a balanced diet with plenty of fruit and vegetables. Blood tests for lipids and cholesterol should begin at age 20 and be repeated every 5 years. If your lipid or cholesterol levels are high, you are over 50, or you are a high risk for heart disease, you may need your cholesterol levels checked more frequently.Ongoing high lipid and cholesterol levels should be treated with medicines if diet and exercise are not effective.  If you smoke, find out from your caregiver how to quit. If you do not use tobacco, do not start.  If you choose to drink alcohol, do not exceed 2 drinks per day. One drink is considered to be 12 ounces (355 mL) of beer, 5 ounces (148 mL) of wine, or 1.5 ounces (44 mL) of liquor.  Avoid use of street drugs. Do not share needles with anyone. Ask for help if you need support or instructions about stopping the use of drugs.  High blood pressure causes heart disease and increases the risk of stroke. Your blood pressure should be checked at least every 1 to 2 years. Ongoing high blood pressure should be treated with medicines, if weight loss and exercise are not effective.  If you are 45 to 57 years old, ask your caregiver if you should take aspirin to prevent heart disease.  Diabetes screening involves taking a blood sample to check your fasting blood sugar level. This should be done once every 3 years,   after age 45, if you are within normal weight and without risk factors for diabetes. Testing should be considered at a younger age or be carried out more frequently if you are overweight and have at least 1 risk factor for diabetes.  Colorectal cancer can be detected and often prevented. Most routine colorectal cancer screening begins at the age of 50 and continues through age 75. However, your caregiver may recommend screening at an earlier age if you  have risk factors for colon cancer. On a yearly basis, your caregiver may provide home test kits to check for hidden blood in the stool. Use of a small camera at the end of a tube, to directly examine the colon (sigmoidoscopy or colonoscopy), can detect the earliest forms of colorectal cancer. Talk to your caregiver about this at age 50, when routine screening begins. Direct examination of the colon should be repeated every 5 to 10 years through age 75, unless early forms of pre-cancerous polyps or small growths are found.  Hepatitis C blood testing is recommended for all people born from 1945 through 1965 and any individual with known risks for hepatitis C.  Practice safe sex. Use condoms and avoid high-risk sexual practices to reduce the spread of sexually transmitted infections (STIs). STIs include gonorrhea, chlamydia, syphilis, trichomonas, herpes, HPV, and human immunodeficiency virus (HIV). Herpes, HIV, and HPV are viral illnesses that have no cure. They can result in disability, cancer, and death.  A one-time screening for abdominal aortic aneurysm (AAA) and surgical repair of large AAAs by sound wave imaging (ultrasonography) is recommended for ages 65 to 75 years who are current or former smokers.  Healthy men should no longer receive prostate-specific antigen (PSA) blood tests as part of routine cancer screening. Consult with your caregiver about prostate cancer screening.  Testicular cancer screening is not recommended for adult males who have no symptoms. Screening includes self-exam, caregiver exam, and other screening tests. Consult with your caregiver about any symptoms you have or any concerns you have about testicular cancer.  Use sunscreen with skin protection factor (SPF) of 30 or more. Apply sunscreen liberally and repeatedly throughout the day. You should seek shade when your shadow is shorter than you. Protect yourself by wearing long sleeves, pants, a wide-brimmed hat, and  sunglasses year round, whenever you are outdoors.  Once a month, do a whole body skin exam, using a mirror to look at the skin on your back. Notify your caregiver of new moles, moles that have irregular borders, moles that are larger than a pencil eraser, or moles that have changed in shape or color.  Stay current with required immunizations.  Influenza. You need a dose every fall (or winter). The composition of the flu vaccine changes each year, so being vaccinated once is not enough.  Pneumococcal polysaccharide. You need 1 to 2 doses if you smoke cigarettes or if you have certain chronic medical conditions. You need 1 dose at age 65 (or older) if you have never been vaccinated.  Tetanus, diphtheria, pertussis (Tdap, Td). Get 1 dose of Tdap vaccine if you are younger than age 65 years, are over 65 and have contact with an infant, are a healthcare worker, or simply want to be protected from whooping cough. After that, you need a Td booster dose every 10 years. Consult your caregiver if you have not had at least 3 tetanus and diphtheria-containing shots sometime in your life or have a deep or dirty wound.  HPV. This vaccine is recommended   for males 13 through 57 years of age. This vaccine may be given to men 22 through 57 years of age who have not completed the 3 dose series. It is recommended for men through age 26 who have sex with men or whose immune system is weakened because of HIV infection, other illness, or medications. The vaccine is given in 3 doses over 6 months.  Measles, mumps, rubella (MMR). You need at least 1 dose of MMR if you were born in 1957 or later. You may also need a 2nd dose.  Meningococcal. If you are age 19 to 21 years and a first-year college student living in a residence hall, or have one of several medical conditions, you need to get vaccinated against meningococcal disease. You may also need additional booster doses.  Zoster (shingles). If you are age 60 years or  older, you should get this vaccine.  Varicella (chickenpox). If you have never had chickenpox or you were vaccinated but received only 1 dose, talk to your caregiver to find out if you need this vaccine.  Hepatitis A. You need this vaccine if you have a specific risk factor for hepatitis A virus infection, or you simply wish to be protected from this disease. The vaccine is usually given as 2 doses, 6 to 18 months apart.  Hepatitis B. You need this vaccine if you have a specific risk factor for hepatitis B virus infection or you simply wish to be protected from this disease. The vaccine is given in 3 doses, usually over 6 months. Preventative Service / Frequency Ages 19 to 39  Blood pressure check.** / Every 1 to 2 years.  Lipid and cholesterol check.** / Every 5 years beginning at age 20.  Hepatitis C blood test.** / For any individual with known risks for hepatitis C.  Skin self-exam. / Monthly.  Influenza immunization.** / Every year.  Pneumococcal polysaccharide immunization.** / 1 to 2 doses if you smoke cigarettes or if you have certain chronic medical conditions.  Tetanus, diphtheria, pertussis (Tdap,Td) immunization. / A one-time dose of Tdap vaccine. After that, you need a Td booster dose every 10 years.  HPV immunization. / 3 doses over 6 months, if 26 and younger.  Measles, mumps, rubella (MMR) immunization. / You need at least 1 dose of MMR if you were born in 1957 or later. You may also need a 2nd dose.  Meningococcal immunization. / 1 dose if you are age 19 to 21 years and a first-year college student living in a residence hall, or have one of several medical conditions, you need to get vaccinated against meningococcal disease. You may also need additional booster doses.  Varicella immunization.** / Consult your caregiver.  Hepatitis A immunization.** / Consult your caregiver. 2 doses, 6 to 18 months apart.  Hepatitis B immunization.** / Consult your caregiver. 3 doses  usually over 6 months. Ages 40 to 64  Blood pressure check.** / Every 1 to 2 years.  Lipid and cholesterol check.** / Every 5 years beginning at age 20.  Fecal occult blood test (FOBT) of stool. / Every year beginning at age 50 and continuing until age 75. You may not have to do this test if you get colonoscopy every 10 years.  Flexible sigmoidoscopy** or colonoscopy.** / Every 5 years for a flexible sigmoidoscopy or every 10 years for a colonoscopy beginning at age 50 and continuing until age 75.  Hepatitis C blood test.** / For all people born from 1945 through 1965 and any   individual with known risks for hepatitis C.  Skin self-exam. / Monthly.  Influenza immunization.** / Every year.  Pneumococcal polysaccharide immunization.** / 1 to 2 doses if you smoke cigarettes or if you have certain chronic medical conditions.  Tetanus, diphtheria, pertussis (Tdap/Td) immunization.** / A one-time dose of Tdap vaccine. After that, you need a Td booster dose every 10 years.  Measles, mumps, rubella (MMR) immunization. / You need at least 1 dose of MMR if you were born in 1957 or later. You may also need a 2nd dose.  Varicella immunization.**/ Consult your caregiver.  Meningococcal immunization.** / Consult your caregiver.  Hepatitis A immunization.** / Consult your caregiver. 2 doses, 6 to 18 months apart.  Hepatitis B immunization.** / Consult your caregiver. 3 doses, usually over 6 months. Ages 65 and over  Blood pressure check.** / Every 1 to 2 years.  Lipid and cholesterol check.**/ Every 5 years beginning at age 20.  Fecal occult blood test (FOBT) of stool. / Every year beginning at age 50 and continuing until age 75. You may not have to do this test if you get colonoscopy every 10 years.  Flexible sigmoidoscopy** or colonoscopy.** / Every 5 years for a flexible sigmoidoscopy or every 10 years for a colonoscopy beginning at age 50 and continuing until age 75.  Hepatitis C blood  test.** / For all people born from 1945 through 1965 and any individual with known risks for hepatitis C.  Abdominal aortic aneurysm (AAA) screening.** / A one-time screening for ages 65 to 75 years who are current or former smokers.  Skin self-exam. / Monthly.  Influenza immunization.** / Every year.  Pneumococcal polysaccharide immunization.** / 1 dose at age 65 (or older) if you have never been vaccinated.  Tetanus, diphtheria, pertussis (Tdap, Td) immunization. / A one-time dose of Tdap vaccine if you are over 65 and have contact with an infant, are a healthcare worker, or simply want to be protected from whooping cough. After that, you need a Td booster dose every 10 years.  Varicella immunization. ** / Consult your caregiver.  Meningococcal immunization.** / Consult your caregiver.  Hepatitis A immunization. ** / Consult your caregiver. 2 doses, 6 to 18 months apart.  Hepatitis B immunization.** / Check with your caregiver. 3 doses, usually over 6 months. **Family history and personal history of risk and conditions may change your caregiver's recommendations. Document Released: 02/10/2002 Document Revised: 03/08/2012 Document Reviewed: 05/12/2011 [employer] Patient Information 2013 [employer].  

## 2012-10-25 NOTE — Assessment & Plan Note (Signed)
Check labs Stop amaryl ---start Victoza---pt assistance forms given to pt

## 2012-10-25 NOTE — Progress Notes (Signed)
Subjective:    Patient ID: Ronald Petersen, male    DOB: 08-Feb-1956, 56 y.o.   MRN: [id number]  HPI  Pt here for cpe and labs.  Pt stopped cholesterol meds ---fenofibrate secondary to cost and lipitor , just because.    No other complaints.  Review of Systems    Review of Systems  Constitutional: Negative for activity change, appetite change and fatigue.  HENT: Negative for hearing loss, congestion, tinnitus and ear discharge.  dentist --due Eyes: Negative for visual disturbance.  ---- optho -due  Respiratory: Negative for cough, chest tightness and shortness of breath.   Cardiovascular: Negative for chest pain, palpitations and leg swelling.  Gastrointestinal: Negative for abdominal pain, diarrhea, constipation and abdominal distention.  Genitourinary: Negative for urgency, frequency, decreased urine volume and difficulty urinating.  Musculoskeletal: Negative for back pain, arthralgias and gait problem.  Skin: Negative for color change, pallor and rash.  Neurological: Negative for dizziness, light-headedness, numbness and headaches.  Hematological: Negative for adenopathy. Does not bruise/bleed easily.  Psychiatric/Behavioral: Negative for suicidal ideas, confusion, sleep disturbance, self-injury, dysphoric mood, decreased concentration and agitation.   Past Medical History  Diagnosis Date  . Hyperlipidemia   . Depression   . Diabetes mellitus    History  Substance Use Topics  . Smoking status: Never Smoker   . Smokeless tobacco: Never Used  . Alcohol Use: No   Current outpatient prescriptions:gabapentin (NEURONTIN) 100 MG capsule, Take 1 capsule (100 mg total) by mouth 3 (three) times daily., Disp: 90 capsule, Rfl: 5;  metFORMIN (GLUCOPHAGE-XR) 500 MG 24 hr tablet, Take 2 tablets (1,000 mg total) by mouth daily with breakfast., Disp: 180 tablet, Rfl: 3;  oxyCODONE-acetaminophen (PERCOCET/ROXICET) 5-325 MG per tablet, Take 1 tablet by mouth every 4 (four) hours as needed.,  Disp: 60 tablet, Rfl: 0 sertraline (ZOLOFT) 100 MG tablet, 1 po qd, Disp: 90 tablet, Rfl: 3;  DISCONTD: metFORMIN (GLUCOPHAGE-XR) 500 MG 24 hr tablet, Take 2 tablets (1,000 mg total) by mouth daily with breakfast., Disp: 60 tablet, Rfl: 2;  atorvastatin (LIPITOR) 20 MG tablet, Take 1 tablet (20 mg total) by mouth daily., Disp: 90 tablet, Rfl: 3;  omeprazole (PRILOSEC) 20 MG capsule, Take 20 mg by mouth daily., Disp: , Rfl:  piroxicam (FELDENE) 20 MG capsule, Take 1 capsule (20 mg total) by mouth daily., Disp: 90 capsule, Rfl: 3;  DISCONTD: atorvastatin (LIPITOR) 20 MG tablet, Take 1 tablet (20 mg total) by mouth daily., Disp: 90 tablet, Rfl: 3  Past Surgical History  Procedure Date  . Appendectomy   . Cholecystectomy   . Lumbar laminectomy    Family History  Problem Relation Age of Onset  . Alcohol abuse    . Depression    . Arthritis    . Hypertension    . Ovarian cancer Sister   . Stomach cancer Sister   . Stomach cancer Maternal Grandmother   . COPD Mother   . Stroke Father   . Coronary artery disease        Objective:   Physical Exam BP 112/80  Pulse 64  Temp 98.2 F (36.8 C) (Oral)  Ht 5\' 11"  (1.803 m)  Wt 234 lb 9.6 oz (106.414 kg)  BMI 32.72 kg/m2  SpO2 97%  General Appearance:    Alert, cooperative, no distress, appears stated age  Head:    Normocephalic, without obvious abnormality, atraumatic  Eyes:    PERRL, conjunctiva/corneas clear, EOM's intact, fundi    benign, both eyes  Ears:  Normal TM's and external ear canals, both ears  Nose:   Nares normal, septum midline, mucosa normal, no drainage    or sinus tenderness  Throat:   Lips, mucosa, and tongue normal; teeth and gums normal  Neck:   Supple, symmetrical, trachea midline, no adenopathy;    thyroid:  no enlargement/tenderness/nodules; no carotid   bruit or JVD  Back:     Symmetric, no curvature, ROM normal, no CVA tenderness  Lungs:     Clear to auscultation bilaterally, respirations unlabored  Chest  Wall:    No tenderness or deformity   Heart:    Regular rate and rhythm, S1 and S2 normal, no murmur, rub   or gallop  Breast Exam:    No tenderness, masses, or nipple abnormality  Abdomen:     Soft, non-tender, bowel sounds active all four quadrants,    no masses, no organomegaly  Genitalia:    Normal male without lesion, discharge or tenderness  Rectal:    Normal tone, normal prostate, no masses or tenderness;   guaiac negative stool  Extremities:   Extremities normal, atraumatic, no cyanosis or edema  Pulses:   2+ and symmetric all extremities  Skin:   Skin color, texture, turgor normal, no rashes or lesions  Lymph nodes:   Cervical, supraclavicular, and axillary nodes normal  Neurologic:   CNII-XII intact, normal strength, sensation and reflexes    throughout  Sensory exam of the foot is normal, tested with the monofilament. Good pulses, no lesions or ulcers, good peripheral pulses.         Assessment & Plan:  cpe-- check labs             ghm utd

## 2012-10-26 ENCOUNTER — Encounter: Payer: Self-pay | Admitting: Family Medicine

## 2012-10-27 ENCOUNTER — Telehealth: Payer: Self-pay | Admitting: *Deleted

## 2012-10-27 NOTE — Telephone Encounter (Signed)
Pt called in to clarify how to take his victoza, read the following directions given on 10-25-12:  Liraglutide (VICTOZA) 18 MG/3ML SOLN 6 mL 10/25/2012 0.6 mg qd for 1 week then 1.2 mg qd May increase to 1.8 mg qd if needed  Pt understood and will impliment

## 2012-11-12 ENCOUNTER — Other Ambulatory Visit: Payer: Self-pay

## 2012-11-12 DIAGNOSIS — E1140 Type 2 diabetes mellitus with diabetic neuropathy, unspecified: Secondary | ICD-10-CM

## 2012-11-12 MED ORDER — LIRAGLUTIDE 18 MG/3ML ~~LOC~~ SOLN: SUBCUTANEOUS | Status: DC

## 2012-11-12 NOTE — Telephone Encounter (Signed)
Rx sent.    MW 

## 2012-11-16 ENCOUNTER — Telehealth: Payer: Self-pay | Admitting: Family Medicine

## 2012-11-16 NOTE — Telephone Encounter (Signed)
Patient would like samples of Victoza. Call mobile # if/when ready for pick up.

## 2012-11-16 NOTE — Telephone Encounter (Signed)
If we have any he can 1 or 2

## 2012-11-16 NOTE — Telephone Encounter (Signed)
Please advise      KP 

## 2012-11-16 NOTE — Telephone Encounter (Signed)
Patient aware samples ready for pick up. He does not have any insurance so I will also leave a patient assistance form for him to complete and mail in.    KP

## 2013-01-06 ENCOUNTER — Telehealth: Payer: Self-pay | Admitting: Family Medicine

## 2013-01-06 NOTE — Telephone Encounter (Signed)
She can have one if we have it 

## 2013-01-06 NOTE — Telephone Encounter (Signed)
Pt states he needs samples of victoza.

## 2013-01-07 NOTE — Telephone Encounter (Signed)
Patient aware sample ready for pick up in the office      KP

## 2013-01-25 ENCOUNTER — Ambulatory Visit: Payer: Self-pay | Admitting: Family Medicine

## 2013-01-25 ENCOUNTER — Encounter: Payer: Self-pay | Admitting: Lab

## 2013-01-26 ENCOUNTER — Ambulatory Visit: Payer: Self-pay | Admitting: Family Medicine

## 2013-02-01 ENCOUNTER — Encounter: Payer: Self-pay | Admitting: Lab

## 2013-02-02 ENCOUNTER — Encounter: Payer: Self-pay | Admitting: Family Medicine

## 2013-02-02 ENCOUNTER — Ambulatory Visit (INDEPENDENT_AMBULATORY_CARE_PROVIDER_SITE_OTHER): Payer: Self-pay | Admitting: Family Medicine

## 2013-02-02 VITALS — BP 116/76 | HR 61 | Temp 98.3°F | Wt 235.2 lb

## 2013-02-02 DIAGNOSIS — E119 Type 2 diabetes mellitus without complications: Secondary | ICD-10-CM

## 2013-02-02 DIAGNOSIS — E785 Hyperlipidemia, unspecified: Secondary | ICD-10-CM

## 2013-02-02 LAB — POCT URINALYSIS DIPSTICK
Bilirubin, UA: NEGATIVE
Glucose, UA: NEGATIVE
Leukocytes, UA: NEGATIVE
Nitrite, UA: NEGATIVE
Urobilinogen, UA: 0.2

## 2013-02-02 LAB — HEPATIC FUNCTION PANEL
ALT: 25 U/L (ref 0–53)
AST: 18 U/L (ref 0–37)
Albumin: 4.3 g/dL (ref 3.5–5.2)
Alkaline Phosphatase: 81 U/L (ref 39–117)
Total Protein: 7.4 g/dL (ref 6.0–8.3)

## 2013-02-02 LAB — MICROALBUMIN / CREATININE URINE RATIO
Creatinine,U: 96.4 mg/dL
Microalb Creat Ratio: 0.8 mg/g (ref 0.0–30.0)

## 2013-02-02 LAB — BASIC METABOLIC PANEL
BUN: 13 mg/dL (ref 6–23)
CO2: 28 mEq/L (ref 19–32)
Chloride: 103 mEq/L (ref 96–112)
Creatinine, Ser: 0.8 mg/dL (ref 0.4–1.5)
Glucose, Bld: 201 mg/dL — ABNORMAL HIGH (ref 70–99)

## 2013-02-02 LAB — LIPID PANEL: Cholesterol: 203 mg/dL — ABNORMAL HIGH (ref 0–200)

## 2013-02-02 LAB — HEMOGLOBIN A1C: Hgb A1c MFr Bld: 7.2 % — ABNORMAL HIGH (ref 4.6–6.5)

## 2013-02-02 NOTE — Assessment & Plan Note (Signed)
Check labs Pt off meds 

## 2013-02-02 NOTE — Patient Instructions (Signed)
Diabetes and Exercise  Regular exercise is important and can help:   · Control blood glucose (sugar).  · Decrease blood pressure.  ·   · Control blood lipids (cholesterol, triglycerides).  · Improve overall health.  BENEFITS FROM EXERCISE  · Improved fitness.  · Improved flexibility.  · Improved endurance.  · Increased bone density.  · Weight control.  · Increased muscle strength.  · Decreased body fat.  · Improvement of the body's use of insulin, a hormone.  · Increased insulin sensitivity.  · Reduction of insulin needs.  · Reduced stress and tension.  · Helps you feel better.  People with diabetes who add exercise to their lifestyle gain additional benefits, including:  · Weight loss.  · Reduced appetite.  · Improvement of the body's use of blood glucose.  · Decreased risk factors for heart disease:  · Lowering of cholesterol and triglycerides.  · Raising the level of good cholesterol (high-density lipoproteins, HDL).  · Lowering blood sugar.  · Decreased blood pressure.  TYPE 1 DIABETES AND EXERCISE  · Exercise will usually lower your blood glucose.  · If blood glucose is greater than 240 mg/dl, check urine ketones. If ketones are present, do not exercise.  · Location of the insulin injection sites may need to be adjusted with exercise. Avoid injecting insulin into areas of the body that will be exercised. For example, avoid injecting insulin into:  · The arms when playing tennis.  · The legs when jogging. For more information, discuss this with your caregiver.  · Keep a record of:  · Food intake.  · Type and amount of exercise.  · Expected peak times of insulin action.  · Blood glucose levels.  Do this before, during, and after exercise. Review your records with your caregiver. This will help you to develop guidelines for adjusting food intake and insulin amounts.   TYPE 2 DIABETES AND EXERCISE  · Regular physical activity can help control blood glucose.  · Exercise is important because it may:  · Increase the  body's sensitivity to insulin.  · Improve blood glucose control.  · Exercise reduces the risk of heart disease. It decreases serum cholesterol and triglycerides. It also lowers blood pressure.  · Those who take insulin or oral hypoglycemic agents should watch for signs of hypoglycemia. These signs include dizziness, shaking, sweating, chills, and confusion.  · Body water is lost during exercise. It must be replaced. This will help to avoid loss of body fluids (dehydration) or heat stroke.  Be sure to talk to your caregiver before starting an exercise program to make sure it is safe for you. Remember, any activity is better than none.   Document Released: 03/06/2004 Document Revised: 03/08/2012 Document Reviewed: 06/21/2009  [employer]® Patient Information ©2013 [employer].

## 2013-02-02 NOTE — Progress Notes (Signed)
  Subjective:     Ronald Petersen is a 57 y.o. male who presents for follow up of diabetes.. Current symptoms include: none. Patient denies hyperglycemia, hypoglycemia , increased appetite, nausea, paresthesia of the feet, polydipsia, polyuria, visual disturbances, vomiting and weight loss. Evaluation to date has been: fasting blood sugar, fasting lipid panel, hemoglobin A1C and microalbuminuria. Home sugars: patient does not check sugars. Current treatments: victoza. Last dilated eye exam 2013.  The following portions of the patient's history were reviewed and updated as appropriate: allergies, current medications, past family history, past medical history, past social history, past surgical history and problem list.  Review of Systems Pertinent items are noted in HPI.    Objective:    BP 116/76  Pulse 61  Temp 98.3 F (36.8 C) (Oral)  Wt 235 lb 3.2 oz (106.686 kg)  SpO2 96% General appearance: alert, cooperative, appears stated age and no distress Lungs: clear to auscultation bilaterally Heart: S1, S2 normal Extremities: extremities normal, atraumatic, no cyanosis or edema  Sensory exam of the foot is normal, tested with the monofilament. Good pulses, no lesions or ulcers, good peripheral pulses.  Patient was not evaluated for proper footwear and sizing.  Laboratory: No components found with this basename: A1C      Assessment:    Diabetes mellitus Type II, under questionable control.   hyperlipidemia---off meds right now Plan:    Discussed general issues about diabetes pathophysiology and management. Counseling at today's visit: discussed the need for weight loss and focused on the need for regular aerobic exercise. Agricultural engineer distributed. Addressed ADA diet. Suggested low cholesterol diet. Encouraged aerobic exercise. Discussed foot care. Reminded to get yearly retinal exam.

## 2013-02-18 ENCOUNTER — Telehealth: Payer: Self-pay

## 2013-02-18 MED ORDER — ATORVASTATIN CALCIUM 20 MG PO TABS: 20.0000 mg | ORAL_TABLET | Freq: Every day | ORAL | Status: DC

## 2013-02-18 MED ORDER — METFORMIN HCL ER 500 MG PO TB24: 1000.0000 mg | ORAL_TABLET | Freq: Every day | ORAL | Status: DC

## 2013-02-18 NOTE — Telephone Encounter (Signed)
Message copied by Arnette Norris on Fri Feb 18, 2013  5:54 PM ------      Message from: Lelon Perla      Created: Mon Feb 07, 2013  2:27 PM       Metformin xr  500 mg  2 po qd #60  2 refills ------

## 2013-02-18 NOTE — Telephone Encounter (Signed)
Notes Recorded by Lelon Perla, DO on 02/07/2013 at 2:27 PM Metformin xr 500 mg 2 po qd #60 2 refills Notes Recorded by Arnette Norris, CMA on 02/07/2013 at 11:04 AM What dose and directions for the Metformin? KP//CMA Notes Recorded by Lelon Perla, DO on 02/03/2013 at 1:14 PM Cholesterol way too high. Cholesterol--- LDL goal < 70, HDL >40, TG < 150. Diet and exercise will increase HDL and decrease LDL and TG. Fish, Fish Oil, Flaxseed oil will also help increase the HDL and decrease Triglycerides. Recheck labs in 3 months----- Start Lipitor 20 mg #30 1 po hs, 2 refills hgba1c should be < 7--- restart metformin also Recheck 3 months---272.4 250.00 Lipid, hep, bmp, hgba1c.  Discussed with Erie Noe-- Medications sent     KP

## 2013-03-14 ENCOUNTER — Encounter: Payer: Self-pay | Admitting: Family Medicine

## 2013-03-14 ENCOUNTER — Other Ambulatory Visit: Payer: Self-pay | Admitting: Family Medicine

## 2013-03-14 DIAGNOSIS — R52 Pain, unspecified: Secondary | ICD-10-CM

## 2013-03-14 MED ORDER — OXYCODONE-ACETAMINOPHEN 5-325 MG PO TABS: 1.0000 | ORAL_TABLET | ORAL | Status: DC | PRN

## 2013-04-05 ENCOUNTER — Encounter: Payer: Self-pay | Admitting: Family Medicine

## 2013-05-18 ENCOUNTER — Other Ambulatory Visit: Payer: Self-pay

## 2013-05-18 MED ORDER — INSULIN PEN NEEDLE 32G X 6 MM MISC: 1.8000 mg | Freq: Every day | Status: DC

## 2013-06-11 ENCOUNTER — Other Ambulatory Visit: Payer: Self-pay | Admitting: Family Medicine

## 2013-07-07 ENCOUNTER — Other Ambulatory Visit: Payer: Self-pay

## 2013-08-04 ENCOUNTER — Ambulatory Visit: Payer: Self-pay | Admitting: Family Medicine

## 2013-08-08 ENCOUNTER — Telehealth: Payer: Self-pay

## 2013-08-08 NOTE — Telephone Encounter (Signed)
Victoza received from the patient assistance program. 4 boxes. I have called to [facility] the patient aware and Erie Noe said she will let the patient know.      KP

## 2013-09-20 ENCOUNTER — Other Ambulatory Visit: Payer: Self-pay | Admitting: Family Medicine

## 2013-09-22 ENCOUNTER — Other Ambulatory Visit: Payer: Self-pay | Admitting: General Practice

## 2013-09-22 MED ORDER — GABAPENTIN 100 MG PO CAPS: ORAL_CAPSULE | ORAL | Status: DC

## 2014-02-09 ENCOUNTER — Other Ambulatory Visit: Payer: Self-pay | Admitting: Family Medicine

## 2014-05-01 ENCOUNTER — Other Ambulatory Visit: Payer: Self-pay | Admitting: Family Medicine

## 2014-06-08 ENCOUNTER — Emergency Department (HOSPITAL_BASED_OUTPATIENT_CLINIC_OR_DEPARTMENT_OTHER): Payer: Self-pay

## 2014-06-08 ENCOUNTER — Encounter (HOSPITAL_BASED_OUTPATIENT_CLINIC_OR_DEPARTMENT_OTHER): Payer: Self-pay | Admitting: Emergency Medicine

## 2014-06-08 ENCOUNTER — Emergency Department (HOSPITAL_BASED_OUTPATIENT_CLINIC_OR_DEPARTMENT_OTHER)
Admission: EM | Admit: 2014-06-08 | Discharge: 2014-06-08 | Disposition: A | Discharge status: Home / Self Care | Payer: Self-pay | Attending: Emergency Medicine | Admitting: Emergency Medicine

## 2014-06-08 DIAGNOSIS — F329 Major depressive disorder, single episode, unspecified: Secondary | ICD-10-CM | POA: Insufficient documentation

## 2014-06-08 DIAGNOSIS — Z791 Long term (current) use of non-steroidal anti-inflammatories (NSAID): Secondary | ICD-10-CM | POA: Insufficient documentation

## 2014-06-08 DIAGNOSIS — Z87891 Personal history of nicotine dependence: Secondary | ICD-10-CM | POA: Insufficient documentation

## 2014-06-08 DIAGNOSIS — E119 Type 2 diabetes mellitus without complications: Secondary | ICD-10-CM | POA: Insufficient documentation

## 2014-06-08 DIAGNOSIS — S52599A Other fractures of lower end of unspecified radius, initial encounter for closed fracture: Secondary | ICD-10-CM | POA: Insufficient documentation

## 2014-06-08 DIAGNOSIS — Z79899 Other long term (current) drug therapy: Secondary | ICD-10-CM | POA: Insufficient documentation

## 2014-06-08 DIAGNOSIS — Y92838 Other recreation area as the place of occurrence of the external cause: Secondary | ICD-10-CM

## 2014-06-08 DIAGNOSIS — R296 Repeated falls: Secondary | ICD-10-CM | POA: Insufficient documentation

## 2014-06-08 DIAGNOSIS — I10 Essential (primary) hypertension: Secondary | ICD-10-CM | POA: Insufficient documentation

## 2014-06-08 DIAGNOSIS — F3289 Other specified depressive episodes: Secondary | ICD-10-CM | POA: Insufficient documentation

## 2014-06-08 DIAGNOSIS — Y9239 Other specified sports and athletic area as the place of occurrence of the external cause: Secondary | ICD-10-CM | POA: Insufficient documentation

## 2014-06-08 DIAGNOSIS — Z794 Long term (current) use of insulin: Secondary | ICD-10-CM | POA: Insufficient documentation

## 2014-06-08 DIAGNOSIS — S52513A Displaced fracture of unspecified radial styloid process, initial encounter for closed fracture: Secondary | ICD-10-CM

## 2014-06-08 DIAGNOSIS — Y9367 Activity, basketball: Secondary | ICD-10-CM | POA: Insufficient documentation

## 2014-06-08 DIAGNOSIS — X500XXA Overexertion from strenuous movement or load, initial encounter: Secondary | ICD-10-CM | POA: Insufficient documentation

## 2014-06-08 DIAGNOSIS — E785 Hyperlipidemia, unspecified: Secondary | ICD-10-CM | POA: Insufficient documentation

## 2014-06-08 HISTORY — DX: Essential (primary) hypertension: I10

## 2014-06-08 NOTE — ED Notes (Signed)
Injury to left wrist that occurred yesterday while playing basketball.

## 2014-06-08 NOTE — Discharge Instructions (Signed)
Forearm Fracture The forearm is between your elbow and your wrist. It has two bones (ulna and radius). A fracture is a break in one or both of these bones. HOME CARE  Raise (elevate) your arm above the level of the heart.  Put ice on the injured area.  Put ice in a plastic bag.  Place a towel between the skin and the bag.  Leave the ice on for 15-20 minutes, 03-04 times a day.  If given a plaster or fiberglass cast:  Do not try to scratch the skin under the cast with sharp or pointed objects.  Check the skin around the cast every day. You may put lotion on any red or sore areas.  Keep the cast dry and clean.  If given a plaster splint:  Wear the splint as told.  You may loosen the elastic around the splint if the fingers become numb, tingle, or turn cold or blue.  Do not put pressure on any part of the cast or splint. It may break. Rest the cast only on a pillow the first 24 hours until it is fully hardened.  The cast or splint can be protected during bathing with a plastic bag. Do not lower the cast or splint into water.  Only take medicine as told by your doctor. GET HELP RIGHT AWAY IF:   The cast gets damaged or breaks.  You have pain or puffiness (swelling).  The skin or nails below the injury turn blue or gray, or feel cold or numb.  There is a bad smell, new stains, or fluid coming from under the cast. MAKE SURE YOU:   Understand these instructions.  Will watch your condition.  Will get help right away if you are not doing well or get worse. Document Released: 06/02/2008 Document Revised: 03/08/2012 Document Reviewed: 06/02/2008 [facility] Patient Information 2014 [place], [place].  Cast or Splint Care Casts and splints support injured limbs and keep bones from moving while they heal. It is important to care for your cast or splint at home.  HOME CARE INSTRUCTIONS  Keep the cast or splint uncovered during the drying period. It can take 24 to 48 hours to dry  if it is made of plaster. A fiberglass cast will dry in less than 1 hour.  Do not rest the cast on anything harder than a pillow for the first 24 hours.  Do not put weight on your injured limb or apply pressure to the cast until your health care provider gives you permission.  Keep the cast or splint dry. Wet casts or splints can lose their shape and may not support the limb as well. A wet cast that has lost its shape can also create harmful pressure on your skin when it dries. Also, wet skin can become infected.  Cover the cast or splint with a plastic bag when bathing or when out in the rain or snow. If the cast is on the trunk of the body, take sponge baths until the cast is removed.  If your cast does become wet, dry it with a towel or a blow dryer on the cool setting only.  Keep your cast or splint clean. Soiled casts may be wiped with a moistened cloth.  Do not place any hard or soft foreign objects under your cast or splint, such as cotton, toilet paper, lotion, or powder.  Do not try to scratch the skin under the cast with any object. The object could get stuck inside the cast.  Also, scratching could lead to an infection. If itching is a problem, use a blow dryer on a cool setting to relieve discomfort.  Do not trim or cut your cast or remove padding from inside of it.  Exercise all joints next to the injury that are not immobilized by the cast or splint. For example, if you have a long leg cast, exercise the hip joint and toes. If you have an arm cast or splint, exercise the shoulder, elbow, thumb, and fingers.  Elevate your injured arm or leg on 1 or 2 pillows for the first 1 to 3 days to decrease swelling and pain.It is best if you can comfortably elevate your cast so it is higher than your heart. SEEK MEDICAL CARE IF:   Your cast or splint cracks.  Your cast or splint is too tight or too loose.  You have unbearable itching inside the cast.  Your cast becomes wet or  develops a soft spot or area.  You have a bad smell coming from inside your cast.  You get an object stuck under your cast.  Your skin around the cast becomes red or raw.  You have new pain or worsening pain after the cast has been applied. SEEK IMMEDIATE MEDICAL CARE IF:   You have fluid leaking through the cast.  You are unable to move your fingers or toes.  You have discolored (blue or white), cool, painful, or very swollen fingers or toes beyond the cast.  You have tingling or numbness around the injured area.  You have severe pain or pressure under the cast.  You have any difficulty with your breathing or have shortness of breath.  You have chest pain. Document Released: 12/12/2000 Document Revised: 10/05/2013 Document Reviewed: 06/23/2013 [facility] Patient Information 2014 [place].

## 2014-06-08 NOTE — ED Provider Notes (Addendum)
CSN: [id number]     Arrival date & time 06/08/14  1318 History   First MD Initiated Contact with Patient 06/08/14 1341     Chief Complaint  Patient presents with  . Wrist Injury     (Consider location/radiation/quality/duration/timing/severity/associated sxs/prior Treatment) Patient is a 58 y.o. male presenting with wrist injury. The history is provided by the patient.  Wrist Injury Location:  Wrist Time since incident:  1 day Injury: yes   Mechanism of injury: fall   Fall:    Fall occurred:  Recreating/playing (playing basketball and went up for layup and fell on outstretch hand)   Impact surface:  Concrete   Point of impact:  Hands Wrist location:  L wrist Pain details:    Quality:  Aching, shooting and throbbing   Radiates to:  Does not radiate   Severity:  Moderate   Onset quality:  Sudden   Timing:  Constant   Progression:  Unchanged Chronicity:  New Handedness:  Right-handed Dislocation: no   Prior injury to area:  No Relieved by:  Nothing Worsened by:  Movement Ineffective treatments:  None tried Associated symptoms: decreased range of motion, stiffness and swelling   Associated symptoms: no muscle weakness     Past Medical History  Diagnosis Date  . Hyperlipidemia   . Depression   . Diabetes mellitus   . Hypertension    Past Surgical History  Procedure Laterality Date  . Appendectomy    . Cholecystectomy    . Lumbar laminectomy     Family History  Problem Relation Age of Onset  . Alcohol abuse    . Depression    . Arthritis    . Hypertension    . Ovarian cancer Sister   . Stomach cancer Sister   . Stomach cancer Maternal Grandmother   . COPD Mother   . Stroke Father   . Coronary artery disease     History  Substance Use Topics  . Smoking status: Former Smoker    Types: Cigarettes  . Smokeless tobacco: Never Used  . Alcohol Use: 2.4 - 3.0 oz/week    4-5 Cans of beer per week     Comment: daily    Review of Systems  Musculoskeletal:  Positive for stiffness.  All other systems reviewed and are negative.     Allergies  Niacin  Home Medications   Prior to Admission medications   Medication Sig Start Date End Date Taking? Authorizing Provider  atorvastatin (LIPITOR) 20 MG tablet Take 1 tablet (20 mg total) by mouth daily. 02/18/13   Rosalita Chessman, DO  gabapentin (NEURONTIN) 100 MG capsule 1 capsule by mouth three times a day--office visit due now 02/10/14   Rosalita Chessman, DO  gabapentin (NEURONTIN) 100 MG capsule TAKE ONE CAPSULE BY MOUTH THREE TIMES DAILY 05/01/14   Rosalita Chessman, DO  Insulin Pen Needle 32G X 6 MM MISC 1.8 mg by Does not apply route daily. 05/18/13   Rosalita Chessman, DO  Liraglutide (VICTOZA) 18 MG/3ML SOLN injection Inject 1.2 mg into the skin daily. 0.6 mg qd for 1 week then 1.2 mg qd    May increase to 1.8 mg qd if needed 11/12/12   Rosalita Chessman, DO  metFORMIN ([name]) 500 MG 24 hr tablet Take 2 tablets (1,000 mg total) by mouth daily with breakfast. 02/18/13   Rosalita Chessman, DO  metFORMIN (GLUCOPHAGE-XR) 500 MG 24 hr tablet TAKE TWO TABLETS BY MOUTH EVERY DAY WITH  BREAKFAST 05/01/14  Rosalita Chessman, DO  omeprazole (PRILOSEC) 20 MG capsule Take 20 mg by mouth daily.    Historical Provider, MD  oxyCODONE-acetaminophen (PERCOCET/ROXICET) 5-325 MG per tablet Take 1 tablet by mouth every 4 (four) hours as needed. 03/14/13   Rosalita Chessman, DO  piroxicam (FELDENE) 20 MG capsule Take 1 capsule (20 mg total) by mouth daily. 10/25/12   Rosalita Chessman, DO  sertraline (ZOLOFT) 100 MG tablet Take 1 by mouth daily--30 days only patient needs an office visit 05/01/14   Rosalita Chessman, DO   BP 137/74  Pulse 70  Temp(Src) 98 F (36.7 C) (Oral)  Resp 18  Ht 6' (1.829 m)  Wt 235 lb (106.595 kg)  BMI 31.86 kg/m2  SpO2 98% Physical Exam  Constitutional: He is oriented to person, place, and time. He appears well-developed and well-nourished. No distress.  HENT:  Head: Normocephalic and atraumatic.  Eyes: EOM  are normal. Pupils are equal, round, and reactive to light.  Cardiovascular: Normal rate.   Pulmonary/Chest: Effort normal.  Musculoskeletal:       Left elbow: Normal.       Left wrist: He exhibits decreased range of motion, tenderness, bony tenderness and swelling. He exhibits no deformity.  Pain and swelling over the left radius with mild snuff box tenderness.  2+ radial pulse.  Neurological: He is alert and oriented to person, place, and time.  Skin: Skin is warm and dry.  Psychiatric: He has a normal mood and affect. His behavior is normal.    ED Course  Procedures (including critical care time) Labs Review Labs Reviewed - No data to display  Imaging Review Dg Wrist Complete Left  06/08/2014   CLINICAL DATA:  Fall  EXAM: LEFT WRIST - COMPLETE 3+ VIEW  COMPARISON:  None.  FINDINGS: Fracture of the radial styloid. Fracture extends into the radial carpal joint. No significant fracture displacement. Wrist joint appears normal. There are cystic changes in the distal ulna compatible with degenerative change.  Moderate degenerative change at the base of the thumb.  IMPRESSION: Nondisplaced intra-articular fracture of the radial styloid.   Electronically Signed   By: Franchot Gallo M.D.   On: 06/08/2014 14:05     EKG Interpretation None      MDM   Final diagnoses:  Closed fracture of radial styloid   Patient with playing basketball persistent wrist pain. Plain films show a nondisplaced intra-articular fracture of the radial styloid. Patient is neurovascularly intact with no internal injury. Will have him follow up with Dr. Barbaraann Barthel in one week. Patient placed in a splint and sling    Blanchie Dessert, MD 06/08/14 1426  Blanchie Dessert, MD 06/08/14 1427

## 2014-06-08 NOTE — ED Notes (Signed)
Ring removed to left ring finger.

## 2014-06-14 ENCOUNTER — Ambulatory Visit (HOSPITAL_BASED_OUTPATIENT_CLINIC_OR_DEPARTMENT_OTHER)
Admission: RE | Admit: 2014-06-14 | Discharge: 2014-06-14 | Disposition: A | Discharge status: Home / Self Care | Payer: Self-pay | Source: Ambulatory Visit | Attending: Family Medicine | Admitting: Family Medicine

## 2014-06-14 ENCOUNTER — Ambulatory Visit (INDEPENDENT_AMBULATORY_CARE_PROVIDER_SITE_OTHER): Payer: Self-pay | Admitting: Family Medicine

## 2014-06-14 ENCOUNTER — Encounter: Payer: Self-pay | Admitting: Family Medicine

## 2014-06-14 VITALS — BP 154/91 | HR 64 | Ht 72.0 in | Wt 235.0 lb

## 2014-06-14 DIAGNOSIS — S6992XA Unspecified injury of left wrist, hand and finger(s), initial encounter: Secondary | ICD-10-CM

## 2014-06-14 DIAGNOSIS — S6990XA Unspecified injury of unspecified wrist, hand and finger(s), initial encounter: Secondary | ICD-10-CM

## 2014-06-14 DIAGNOSIS — Z4789 Encounter for other orthopedic aftercare: Secondary | ICD-10-CM | POA: Insufficient documentation

## 2014-06-14 DIAGNOSIS — S59919A Unspecified injury of unspecified forearm, initial encounter: Secondary | ICD-10-CM

## 2014-06-14 DIAGNOSIS — S59909A Unspecified injury of unspecified elbow, initial encounter: Secondary | ICD-10-CM

## 2014-06-14 MED ORDER — HYDROCODONE-ACETAMINOPHEN 5-325 MG PO TABS: 1.0000 | ORAL_TABLET | Freq: Four times a day (QID) | ORAL | Status: DC | PRN

## 2014-06-14 NOTE — Patient Instructions (Signed)
Follow up with me in 2 weeks. Take norco as needed for severe pain - no driving on this medicine.

## 2014-06-15 ENCOUNTER — Encounter: Payer: Self-pay | Admitting: Family Medicine

## 2014-06-15 DIAGNOSIS — S6992XA Unspecified injury of left wrist, hand and finger(s), initial encounter: Secondary | ICD-10-CM | POA: Insufficient documentation

## 2014-06-15 NOTE — Progress Notes (Signed)
Patient ID: Ronald Petersen, male   DOB: May 15, 1956, 58 y.o.   MRN: [id number]  PCP: Garnet Koyanagi, DO  Subjective:   HPI: Patient is a 58 y.o. male here for left wrist injury.  Patient reports on 6/10 he was playing basketball with his grandkids when he fell backwards and sustained a foosh injury to left wrist. Is right handed. Immediate pain, swelling, bruising. No prior injuries. In ED radiographs showed a distal radius fracture, nondisplaced but into the radiocarpal joint. Placed in a splint with sling and referred here.  Past Medical History  Diagnosis Date  . Hyperlipidemia   . Depression   . Diabetes mellitus   . Hypertension     Current Outpatient Prescriptions on File Prior to Visit  Medication Sig Dispense Refill  . atorvastatin (LIPITOR) 20 MG tablet Take 1 tablet (20 mg total) by mouth daily.  30 tablet  2  . gabapentin (NEURONTIN) 100 MG capsule 1 capsule by mouth three times a day--office visit due now  90 capsule  0  . gabapentin (NEURONTIN) 100 MG capsule TAKE ONE CAPSULE BY MOUTH THREE TIMES DAILY  90 capsule  0  . Insulin Pen Needle 32G X 6 MM MISC 1.8 mg by Does not apply route daily.  100 each  1  . Liraglutide (VICTOZA) 18 MG/3ML SOLN injection Inject 1.2 mg into the skin daily. 0.6 mg qd for 1 week then 1.2 mg qd    May increase to 1.8 mg qd if needed      . metFORMIN ([name]) 500 MG 24 hr tablet Take 2 tablets (1,000 mg total) by mouth daily with breakfast.  60 tablet  2  . metFORMIN (GLUCOPHAGE-XR) 500 MG 24 hr tablet TAKE TWO TABLETS BY MOUTH EVERY DAY WITH  BREAKFAST  60 tablet  0  . omeprazole (PRILOSEC) 20 MG capsule Take 20 mg by mouth daily.      . piroxicam (FELDENE) 20 MG capsule Take 1 capsule (20 mg total) by mouth daily.  90 capsule  3  . sertraline (ZOLOFT) 100 MG tablet Take 1 by mouth daily--30 days only patient needs an office visit  30 tablet  0   No current facility-administered medications on file prior to visit.    Past Surgical  History  Procedure Laterality Date  . Appendectomy    . Cholecystectomy    . Lumbar laminectomy      Allergies  Allergen Reactions  . Niacin     REACTION: rash    History   Social History  . Marital Status: Married    Spouse Name: N/A    Number of Children: N/A  . Years of Education: N/A   Occupational History  . unemployed    Social History Main Topics  . Smoking status: Former Smoker    Types: Cigarettes  . Smokeless tobacco: Never Used  . Alcohol Use: 2.4 - 3.0 oz/week    4-5 Cans of beer per week     Comment: daily  . Drug Use: No  . Sexual Activity: Yes    Partners: Female   Other Topics Concern  . Not on file   Social History Narrative   Exercise-- 3 days    Family History  Problem Relation Age of Onset  . Alcohol abuse    . Depression    . Arthritis    . Hypertension    . Ovarian cancer Sister   . Stomach cancer Sister   . Stomach cancer Maternal Grandmother   .  COPD Mother   . Stroke Father   . Coronary artery disease      BP 154/91  Pulse 64  Ht 6' (1.829 m)  Wt 235 lb (106.595 kg)  BMI 31.86 kg/m2  Review of Systems: See HPI above.    Objective:  Physical Exam:  Gen: NAD  Left wrist: Splint removed. Mild swelling, bruising dorsal wrist. No other deformity. TTP distal radius.  No snuffbox, other tenderness. Able to flex, extend, oppose digits. NVI distally.    Assessment & Plan:  1. Left distal radius fracture - nondisplaced but extends into radiocarpal joint.  Short arm cast placed today.  Norco as needed for severe pain.  F/u in 2 weeks for reevaluation, repeat x-rays after cast removal.

## 2014-06-15 NOTE — Assessment & Plan Note (Signed)
Left distal radius fracture - nondisplaced but extends into radiocarpal joint.  Short arm cast placed today.  Norco as needed for severe pain.  F/u in 2 weeks for reevaluation, repeat x-rays after cast removal.

## 2014-07-04 ENCOUNTER — Ambulatory Visit (HOSPITAL_BASED_OUTPATIENT_CLINIC_OR_DEPARTMENT_OTHER)
Admission: RE | Admit: 2014-07-04 | Discharge: 2014-07-04 | Disposition: A | Discharge status: Home / Self Care | Payer: Self-pay | Source: Ambulatory Visit | Attending: Family Medicine | Admitting: Family Medicine

## 2014-07-04 ENCOUNTER — Encounter: Payer: Self-pay | Admitting: Family Medicine

## 2014-07-04 ENCOUNTER — Ambulatory Visit: Payer: Self-pay | Admitting: Family Medicine

## 2014-07-04 ENCOUNTER — Ambulatory Visit (INDEPENDENT_AMBULATORY_CARE_PROVIDER_SITE_OTHER): Payer: Self-pay | Admitting: Family Medicine

## 2014-07-04 VITALS — BP 150/89 | HR 60 | Ht 72.0 in | Wt 230.0 lb

## 2014-07-04 DIAGNOSIS — S59909A Unspecified injury of unspecified elbow, initial encounter: Secondary | ICD-10-CM | POA: Insufficient documentation

## 2014-07-04 DIAGNOSIS — S59919A Unspecified injury of unspecified forearm, initial encounter: Secondary | ICD-10-CM

## 2014-07-04 DIAGNOSIS — S6990XA Unspecified injury of unspecified wrist, hand and finger(s), initial encounter: Secondary | ICD-10-CM

## 2014-07-04 DIAGNOSIS — Z5189 Encounter for other specified aftercare: Secondary | ICD-10-CM

## 2014-07-04 DIAGNOSIS — X58XXXA Exposure to other specified factors, initial encounter: Secondary | ICD-10-CM | POA: Insufficient documentation

## 2014-07-04 DIAGNOSIS — S6992XD Unspecified injury of left wrist, hand and finger(s), subsequent encounter: Secondary | ICD-10-CM

## 2014-07-04 NOTE — Assessment & Plan Note (Signed)
Left distal radius fracture - nondisplaced but extends into radiocarpal joint.  Radiographs unchanged though clinically no tenderness any longer.  New short arm cast placed today.  F/u when he is 6 weeks out - if clinically still without pain, may not repeat radiographs as these lag behind clinical healing by several weeks.  Norco as needed for severe pain.

## 2014-07-04 NOTE — Patient Instructions (Signed)
Follow up with me on 7/22 or later for cast removal. We will repeat x-rays only if you're having problems.

## 2014-07-04 NOTE — Progress Notes (Signed)
Patient ID: Ronald Petersen, male   DOB: December 04, 1956, 58 y.o.   MRN: [id number]  PCP: Garnet Koyanagi, DO  Subjective:   HPI: Patient is a 58 y.o. male here for left wrist injury.  6/17: Patient reports on 6/10 he was playing basketball with his grandkids when he fell backwards and sustained a foosh injury to left wrist. Is right handed. Immediate pain, swelling, bruising. No prior injuries. In ED radiographs showed a distal radius fracture, nondisplaced but into the radiocarpal joint. Placed in a splint with sling and referred here.  7/7: Patient reports he's doing well. Little bit of pain, 1/10 level. Swelling at times. Taking norco as needed. Doing well with cast.  Past Medical History  Diagnosis Date  . Hyperlipidemia   . Depression   . Diabetes mellitus   . Hypertension     Current Outpatient Prescriptions on File Prior to Visit  Medication Sig Dispense Refill  . atorvastatin (LIPITOR) 20 MG tablet Take 1 tablet (20 mg total) by mouth daily.  30 tablet  2  . gabapentin (NEURONTIN) 100 MG capsule 1 capsule by mouth three times a day--office visit due now  90 capsule  0  . gabapentin (NEURONTIN) 100 MG capsule TAKE ONE CAPSULE BY MOUTH THREE TIMES DAILY  90 capsule  0  . HYDROcodone-acetaminophen (NORCO) 5-325 MG per tablet Take 1 tablet by mouth every 6 (six) hours as needed for moderate pain.  60 tablet  0  . Insulin Pen Needle 32G X 6 MM MISC 1.8 mg by Does not apply route daily.  100 each  1  . Liraglutide (VICTOZA) 18 MG/3ML SOLN injection Inject 1.2 mg into the skin daily. 0.6 mg qd for 1 week then 1.2 mg qd    May increase to 1.8 mg qd if needed      . metFORMIN ([name]) 500 MG 24 hr tablet Take 2 tablets (1,000 mg total) by mouth daily with breakfast.  60 tablet  2  . metFORMIN (GLUCOPHAGE-XR) 500 MG 24 hr tablet TAKE TWO TABLETS BY MOUTH EVERY DAY WITH  BREAKFAST  60 tablet  0  . omeprazole (PRILOSEC) 20 MG capsule Take 20 mg by mouth daily.      . piroxicam  (FELDENE) 20 MG capsule Take 1 capsule (20 mg total) by mouth daily.  90 capsule  3  . sertraline (ZOLOFT) 100 MG tablet Take 1 by mouth daily--30 days only patient needs an office visit  30 tablet  0   No current facility-administered medications on file prior to visit.    Past Surgical History  Procedure Laterality Date  . Appendectomy    . Cholecystectomy    . Lumbar laminectomy      Allergies  Allergen Reactions  . Niacin     REACTION: rash    History   Social History  . Marital Status: Married    Spouse Name: N/A    Number of Children: N/A  . Years of Education: N/A   Occupational History  . unemployed    Social History Main Topics  . Smoking status: Former Smoker    Types: Cigarettes  . Smokeless tobacco: Never Used  . Alcohol Use: 2.4 - 3.0 oz/week    4-5 Cans of beer per week     Comment: daily  . Drug Use: No  . Sexual Activity: Yes    Partners: Female   Other Topics Concern  . Not on file   Social History Narrative   Exercise-- 3 days  Family History  Problem Relation Age of Onset  . Alcohol abuse    . Depression    . Arthritis    . Hypertension    . Ovarian cancer Sister   . Stomach cancer Sister   . Stomach cancer Maternal Grandmother   . COPD Mother   . Stroke Father   . Coronary artery disease      BP 150/89  Pulse 60  Ht 6' (1.829 m)  Wt 230 lb (104.327 kg)  BMI 31.19 kg/m2  Review of Systems: See HPI above.    Objective:  Physical Exam:  Gen: NAD  Left wrist: Cast removed. No swelilng, bruising, other deformity. No other deformity. No TTP distal radius.  No snuffbox, other tenderness. Able to flex, extend, oppose digits. NVI distally.    Assessment & Plan:  1. Left distal radius fracture - nondisplaced but extends into radiocarpal joint.  Radiographs unchanged though clinically no tenderness any longer.  New short arm cast placed today.  F/u when he is 6 weeks out - if clinically still without pain, may not repeat  radiographs as these lag behind clinical healing by several weeks.  Norco as needed for severe pain.

## 2014-07-19 ENCOUNTER — Ambulatory Visit: Payer: Self-pay | Admitting: Family Medicine

## 2014-07-31 ENCOUNTER — Encounter: Payer: Self-pay | Admitting: Family Medicine

## 2014-07-31 ENCOUNTER — Ambulatory Visit (INDEPENDENT_AMBULATORY_CARE_PROVIDER_SITE_OTHER): Payer: Self-pay | Admitting: Family Medicine

## 2014-07-31 VITALS — BP 134/82 | HR 59 | Temp 98.0°F | Ht 72.0 in | Wt 229.8 lb

## 2014-07-31 DIAGNOSIS — I10 Essential (primary) hypertension: Secondary | ICD-10-CM

## 2014-07-31 DIAGNOSIS — Z23 Encounter for immunization: Secondary | ICD-10-CM

## 2014-07-31 DIAGNOSIS — E1159 Type 2 diabetes mellitus with other circulatory complications: Secondary | ICD-10-CM

## 2014-07-31 DIAGNOSIS — Z0000 Encounter for general adult medical examination without abnormal findings: Secondary | ICD-10-CM

## 2014-07-31 DIAGNOSIS — R7989 Other specified abnormal findings of blood chemistry: Secondary | ICD-10-CM

## 2014-07-31 DIAGNOSIS — F411 Generalized anxiety disorder: Secondary | ICD-10-CM

## 2014-07-31 DIAGNOSIS — E785 Hyperlipidemia, unspecified: Secondary | ICD-10-CM

## 2014-07-31 LAB — HEMOGLOBIN A1C: Hgb A1c MFr Bld: 7.9 % — ABNORMAL HIGH (ref 4.6–6.5)

## 2014-07-31 LAB — BASIC METABOLIC PANEL
BUN: 13 mg/dL (ref 6–23)
CALCIUM: 9 mg/dL (ref 8.4–10.5)
CHLORIDE: 102 meq/L (ref 96–112)
CO2: 24 mEq/L (ref 19–32)
Creatinine, Ser: 0.8 mg/dL (ref 0.4–1.5)
GFR: 113.26 mL/min (ref >60.00)
Glucose, Bld: 123 mg/dL — ABNORMAL HIGH (ref 70–99)
Potassium: 3.8 mEq/L (ref 3.5–5.1)
Sodium: 134 mEq/L — ABNORMAL LOW (ref 135–145)

## 2014-07-31 LAB — POCT URINALYSIS DIPSTICK
Bilirubin, UA: NEGATIVE
GLUCOSE UA: NEGATIVE
Ketones, UA: NEGATIVE
Leukocytes, UA: NEGATIVE
NITRITE UA: NEGATIVE
Protein, UA: NEGATIVE
RBC UA: NEGATIVE
Spec Grav, UA: 1.02
UROBILINOGEN UA: 0.2
pH, UA: 6

## 2014-07-31 LAB — CBC WITH DIFFERENTIAL/PLATELET
BASOS PCT: 0.4 % (ref 0.0–3.0)
Basophils Absolute: 0 10*3/uL (ref 0.0–0.1)
EOS PCT: 2.5 % (ref 0.0–5.0)
Eosinophils Absolute: 0.1 10*3/uL (ref 0.0–0.7)
HCT: 42.5 % (ref 39.0–52.0)
Hemoglobin: 14.5 g/dL (ref 13.0–17.0)
LYMPHS PCT: 40.1 % (ref 12.0–46.0)
Lymphs Abs: 2.3 10*3/uL (ref 0.7–4.0)
MCHC: 34.2 g/dL (ref 30.0–36.0)
MCV: 84.1 fl (ref 78.0–100.0)
Monocytes Absolute: 0.4 10*3/uL (ref 0.1–1.0)
Monocytes Relative: 7.7 % (ref 3.0–12.0)
Neutro Abs: 2.8 10*3/uL (ref 1.4–7.7)
Neutrophils Relative %: 49.3 % (ref 43.0–77.0)
Platelets: 169 10*3/uL (ref 150.0–400.0)
RBC: 5.06 Mil/uL (ref 4.22–5.81)
RDW: 12.8 % (ref 11.5–15.5)
WBC: 5.7 10*3/uL (ref 4.0–10.5)

## 2014-07-31 LAB — HEPATIC FUNCTION PANEL
ALT: 24 U/L (ref 0–53)
AST: 22 U/L (ref 0–37)
Albumin: 4.1 g/dL (ref 3.5–5.2)
Alkaline Phosphatase: 72 U/L (ref 39–117)
BILIRUBIN DIRECT: 0.1 mg/dL (ref 0.0–0.3)
Total Bilirubin: 0.9 mg/dL (ref 0.2–1.2)
Total Protein: 7.4 g/dL (ref 6.0–8.3)

## 2014-07-31 LAB — LIPID PANEL
CHOL/HDL RATIO: 7
Cholesterol: 199 mg/dL (ref 0–200)
HDL: 30.1 mg/dL — ABNORMAL LOW (ref >39.00)
NONHDL: 168.9
Triglycerides: 253 mg/dL — ABNORMAL HIGH (ref 0.0–149.0)
VLDL: 50.6 mg/dL — ABNORMAL HIGH (ref 0.0–40.0)

## 2014-07-31 LAB — LDL CHOLESTEROL, DIRECT: LDL DIRECT: 149.3 mg/dL

## 2014-07-31 MED ORDER — SERTRALINE HCL 100 MG PO TABS: ORAL_TABLET | ORAL | Status: DC

## 2014-07-31 MED ORDER — HYDROCODONE-ACETAMINOPHEN 5-325 MG PO TABS: 1.0000 | ORAL_TABLET | Freq: Four times a day (QID) | ORAL | Status: DC | PRN

## 2014-07-31 NOTE — Progress Notes (Signed)
Pre visit review using our clinic review tool, if applicable. No additional management support is needed unless otherwise documented below in the visit note. 

## 2014-07-31 NOTE — Patient Instructions (Signed)
Preventive Care for Adults A healthy lifestyle and preventive care can promote health and wellness. Preventive health guidelines for men include the following key practices:  A routine yearly physical is a good way to check with your health care provider about your health and preventative screening. It is a chance to share any concerns and updates on your health and to receive a thorough exam.  Visit your dentist for a routine exam and preventative care every 6 months. Brush your teeth twice a day and floss once a day. Good oral hygiene prevents tooth decay and gum disease.  The frequency of eye exams is based on your age, health, family medical history, use of contact lenses, and other factors. Follow your health care provider's recommendations for frequency of eye exams.  Eat a healthy diet. Foods such as vegetables, fruits, whole grains, low-fat dairy products, and lean protein foods contain the nutrients you need without too many calories. Decrease your intake of foods high in solid fats, added sugars, and salt. Eat the right amount of calories for you.Get information about a proper diet from your health care provider, if necessary.  Regular physical exercise is one of the most important things you can do for your health. Most adults should get at least 150 minutes of moderate-intensity exercise (any activity that increases your heart rate and causes you to sweat) each week. In addition, most adults need muscle-strengthening exercises on 2 or more days a week.  Maintain a healthy weight. The body mass index (BMI) is a screening tool to identify possible weight problems. It provides an estimate of body fat based on height and weight. Your health care provider can find your BMI and can help you achieve or maintain a healthy weight.For adults 20 years and older:  A BMI below 18.5 is considered underweight.  A BMI of 18.5 to 24.9 is normal.  A BMI of 25 to 29.9 is considered overweight.  A BMI  of 30 and above is considered obese.  Maintain normal blood lipids and cholesterol levels by exercising and minimizing your intake of saturated fat. Eat a balanced diet with plenty of fruit and vegetables. Blood tests for lipids and cholesterol should begin at age 50 and be repeated every 5 years. If your lipid or cholesterol levels are high, you are over 50, or you are at high risk for heart disease, you may need your cholesterol levels checked more frequently.Ongoing high lipid and cholesterol levels should be treated with medicines if diet and exercise are not working.  If you smoke, find out from your health care provider how to quit. If you do not use tobacco, do not start.  Lung cancer screening is recommended for adults aged 73-80 years who are at high risk for developing lung cancer because of a history of smoking. A yearly low-dose CT scan of the lungs is recommended for people who have at least a 30-pack-year history of smoking and are a current smoker or have quit within the past 15 years. A pack year of smoking is smoking an average of 1 pack of cigarettes a day for 1 year (for example: 1 pack a day for 30 years or 2 packs a day for 15 years). Yearly screening should continue until the smoker has stopped smoking for at least 15 years. Yearly screening should be stopped for people who develop a health problem that would prevent them from having lung cancer treatment.  If you choose to drink alcohol, do not have more than  2 drinks per day. One drink is considered to be 12 ounces (355 mL) of beer, 5 ounces (148 mL) of wine, or 1.5 ounces (44 mL) of liquor.  Avoid use of street drugs. Do not share needles with anyone. Ask for help if you need support or instructions about stopping the use of drugs.  High blood pressure causes heart disease and increases the risk of stroke. Your blood pressure should be checked at least every 1-2 years. Ongoing high blood pressure should be treated with  medicines, if weight loss and exercise are not effective.  If you are 45-79 years old, ask your health care provider if you should take aspirin to prevent heart disease.  Diabetes screening involves taking a blood sample to check your fasting blood sugar level. This should be done once every 3 years, after age 45, if you are within normal weight and without risk factors for diabetes. Testing should be considered at a younger age or be carried out more frequently if you are overweight and have at least 1 risk factor for diabetes.  Colorectal cancer can be detected and often prevented. Most routine colorectal cancer screening begins at the age of 50 and continues through age 75. However, your health care provider may recommend screening at an earlier age if you have risk factors for colon cancer. On a yearly basis, your health care provider may provide home test kits to check for hidden blood in the stool. Use of a small camera at the end of a tube to directly examine the colon (sigmoidoscopy or colonoscopy) can detect the earliest forms of colorectal cancer. Talk to your health care provider about this at age 50, when routine screening begins. Direct exam of the colon should be repeated every 5-10 years through age 75, unless early forms of precancerous polyps or small growths are found.  People who are at an increased risk for hepatitis B should be screened for this virus. You are considered at high risk for hepatitis B if:  You were born in a country where hepatitis B occurs often. Talk with your health care provider about which countries are considered high risk.  Your parents were born in a high-risk country and you have not received a shot to protect against hepatitis B (hepatitis B vaccine).  You have HIV or AIDS.  You use needles to inject street drugs.  You live with, or have sex with, someone who has hepatitis B.  You are a man who has sex with other men (MSM).  You get hemodialysis  treatment.  You take certain medicines for conditions such as cancer, organ transplantation, and autoimmune conditions.  Hepatitis C blood testing is recommended for all people born from 1945 through 1965 and any individual with known risks for hepatitis C.  Practice safe sex. Use condoms and avoid high-risk sexual practices to reduce the spread of sexually transmitted infections (STIs). STIs include gonorrhea, chlamydia, syphilis, trichomonas, herpes, HPV, and human immunodeficiency virus (HIV). Herpes, HIV, and HPV are viral illnesses that have no cure. They can result in disability, cancer, and death.  If you are at risk of being infected with HIV, it is recommended that you take a prescription medicine daily to prevent HIV infection. This is called preexposure prophylaxis (PrEP). You are considered at risk if:  You are a man who has sex with other men (MSM) and have other risk factors.  You are a heterosexual man, are sexually active, and are at increased risk for HIV infection.    You take drugs by injection.  You are sexually active with a partner who has HIV.  Talk with your health care provider about whether you are at high risk of being infected with HIV. If you choose to begin PrEP, you should first be tested for HIV. You should then be tested every 3 months for as long as you are taking PrEP.  A one-time screening for abdominal aortic aneurysm (AAA) and surgical repair of large AAAs by ultrasound are recommended for men ages 32 to 67 years who are current or former smokers.  Healthy men should no longer receive prostate-specific antigen (PSA) blood tests as part of routine cancer screening. Talk with your health care provider about prostate cancer screening.  Testicular cancer screening is not recommended for adult males who have no symptoms. Screening includes self-exam, a health care provider exam, and other screening tests. Consult with your health care provider about any symptoms  you have or any concerns you have about testicular cancer.  Use sunscreen. Apply sunscreen liberally and repeatedly throughout the day. You should seek shade when your shadow is shorter than you. Protect yourself by wearing long sleeves, pants, a wide-brimmed hat, and sunglasses year round, whenever you are outdoors.  Once a month, do a whole-body skin exam, using a mirror to look at the skin on your back. Tell your health care provider about new moles, moles that have irregular borders, moles that are larger than a pencil eraser, or moles that have changed in shape or color.  Stay current with required vaccines (immunizations).  Influenza vaccine. All adults should be immunized every year.  Tetanus, diphtheria, and acellular pertussis (Td, Tdap) vaccine. An adult who has not previously received Tdap or who does not know his vaccine status should receive 1 dose of Tdap. This initial dose should be followed by tetanus and diphtheria toxoids (Td) booster doses every 10 years. Adults with an unknown or incomplete history of completing a 3-dose immunization series with Td-containing vaccines should begin or complete a primary immunization series including a Tdap dose. Adults should receive a Td booster every 10 years.  Varicella vaccine. An adult without evidence of immunity to varicella should receive 2 doses or a second dose if he has previously received 1 dose.  Human papillomavirus (HPV) vaccine. Males aged 68-21 years who have not received the vaccine previously should receive the 3-dose series. Males aged 22-26 years may be immunized. Immunization is recommended through the age of 6 years for any male who has sex with males and did not get any or all doses earlier. Immunization is recommended for any person with an immunocompromised condition through the age of 49 years if he did not get any or all doses earlier. During the 3-dose series, the second dose should be obtained 4-8 weeks after the first  dose. The third dose should be obtained 24 weeks after the first dose and 16 weeks after the second dose.  Zoster vaccine. One dose is recommended for adults aged 50 years or older unless certain conditions are present.  Measles, mumps, and rubella (MMR) vaccine. Adults born before [birth year] generally are considered immune to measles and mumps. Adults born in [birth year] or later should have 1 or more doses of MMR vaccine unless there is a contraindication to the vaccine or there is laboratory evidence of immunity to each of the three diseases. A routine second dose of MMR vaccine should be obtained at least 28 days after the first dose for students attending postsecondary  schools, health care workers, or international travelers. People who received inactivated measles vaccine or an unknown type of measles vaccine during 1963-1967 should receive 2 doses of MMR vaccine. People who received inactivated mumps vaccine or an unknown type of mumps vaccine before 1979 and are at high risk for mumps infection should consider immunization with 2 doses of MMR vaccine. Unvaccinated health care workers born before 1957 who lack laboratory evidence of measles, mumps, or rubella immunity or laboratory confirmation of disease should consider measles and mumps immunization with 2 doses of MMR vaccine or rubella immunization with 1 dose of MMR vaccine.  Pneumococcal 13-valent conjugate (PCV13) vaccine. When indicated, a person who is uncertain of his immunization history and has no record of immunization should receive the PCV13 vaccine. An adult aged 19 years or older who has certain medical conditions and has not been previously immunized should receive 1 dose of PCV13 vaccine. This PCV13 should be followed with a dose of pneumococcal polysaccharide (PPSV23) vaccine. The PPSV23 vaccine dose should be obtained at least 8 weeks after the dose of PCV13 vaccine. An adult aged 19 years or older who has certain medical conditions and  previously received 1 or more doses of PPSV23 vaccine should receive 1 dose of PCV13. The PCV13 vaccine dose should be obtained 1 or more years after the last PPSV23 vaccine dose.  Pneumococcal polysaccharide (PPSV23) vaccine. When PCV13 is also indicated, PCV13 should be obtained first. All adults aged 65 years and older should be immunized. An adult younger than age 65 years who has certain medical conditions should be immunized. Any person who resides in a nursing home or long-term care facility should be immunized. An adult smoker should be immunized. People with an immunocompromised condition and certain other conditions should receive both PCV13 and PPSV23 vaccines. People with human immunodeficiency virus (HIV) infection should be immunized as soon as possible after diagnosis. Immunization during chemotherapy or radiation therapy should be avoided. Routine use of PPSV23 vaccine is not recommended for American Indians, [place] Natives, or people younger than 65 years unless there are medical conditions that require PPSV23 vaccine. When indicated, people who have unknown immunization and have no record of immunization should receive PPSV23 vaccine. One-time revaccination 5 years after the first dose of PPSV23 is recommended for people aged 19-64 years who have chronic kidney failure, nephrotic syndrome, asplenia, or immunocompromised conditions. People who received 1-2 doses of PPSV23 before age 65 years should receive another dose of PPSV23 vaccine at age 65 years or later if at least 5 years have passed since the previous dose. Doses of PPSV23 are not needed for people immunized with PPSV23 at or after age 65 years.  Meningococcal vaccine. Adults with asplenia or persistent complement component deficiencies should receive 2 doses of quadrivalent meningococcal conjugate (MenACWY-D) vaccine. The doses should be obtained at least 2 months apart. Microbiologists working with certain meningococcal bacteria,  military recruits, people at risk during an outbreak, and people who travel to or live in countries with a high rate of meningitis should be immunized. A first-year college student up through age 21 years who is living in a residence hall should receive a dose if he did not receive a dose on or after his 16th birthday. Adults who have certain high-risk conditions should receive one or more doses of vaccine.  Hepatitis A vaccine. Adults who wish to be protected from this disease, have certain high-risk conditions, work with hepatitis A-infected animals, work in hepatitis A research labs, or   travel to or work in countries with a high rate of hepatitis A should be immunized. Adults who were previously unvaccinated and who anticipate close contact with an international adoptee during the first 60 days after arrival in the [place] from a country with a high rate of hepatitis A should be immunized.  Hepatitis B vaccine. Adults should be immunized if they wish to be protected from this disease, have certain high-risk conditions, may be exposed to blood or other infectious body fluids, are household contacts or sex partners of hepatitis B positive people, are clients or workers in certain care facilities, or travel to or work in countries with a high rate of hepatitis B.  Haemophilus influenzae type b (Hib) vaccine. A previously unvaccinated person with asplenia or sickle cell disease or having a scheduled splenectomy should receive 1 dose of Hib vaccine. Regardless of previous immunization, a recipient of a hematopoietic stem cell transplant should receive a 3-dose series 6-12 months after his successful transplant. Hib vaccine is not recommended for adults with HIV infection. Preventive Service / Frequency Ages 52 to 17  Blood pressure check.** / Every 1 to 2 years.  Lipid and cholesterol check.** / Every 5 years beginning at age 69.  Hepatitis C blood test.** / For any individual with known risks for  hepatitis C.  Skin self-exam. / Monthly.  Influenza vaccine. / Every year.  Tetanus, diphtheria, and acellular pertussis (Tdap, Td) vaccine.** / Consult your health care provider. 1 dose of Td every 10 years.  Varicella vaccine.** / Consult your health care provider.  HPV vaccine. / 3 doses over 6 months, if 72 or younger.  Measles, mumps, rubella (MMR) vaccine.** / You need at least 1 dose of MMR if you were born in 1957 or later. You may also need a second dose.  Pneumococcal 13-valent conjugate (PCV13) vaccine.** / Consult your health care provider.  Pneumococcal polysaccharide (PPSV23) vaccine.** / 1 to 2 doses if you smoke cigarettes or if you have certain conditions.  Meningococcal vaccine.** / 1 dose if you are age 35 to 60 years and a Market researcher living in a residence hall, or have one of several medical conditions. You may also need additional booster doses.  Hepatitis A vaccine.** / Consult your health care provider.  Hepatitis B vaccine.** / Consult your health care provider.  Haemophilus influenzae type b (Hib) vaccine.** / Consult your health care provider. Ages 35 to 8  Blood pressure check.** / Every 1 to 2 years.  Lipid and cholesterol check.** / Every 5 years beginning at age 57.  Lung cancer screening. / Every year if you are aged 44-80 years and have a 30-pack-year history of smoking and currently smoke or have quit within the past 15 years. Yearly screening is stopped once you have quit smoking for at least 15 years or develop a health problem that would prevent you from having lung cancer treatment.  Fecal occult blood test (FOBT) of stool. / Every year beginning at age 55 and continuing until age 73. You may not have to do this test if you get a colonoscopy every 10 years.  Flexible sigmoidoscopy** or colonoscopy.** / Every 5 years for a flexible sigmoidoscopy or every 10 years for a colonoscopy beginning at age 28 and continuing until age  1.  Hepatitis C blood test.** / For all people born from [birth year] through 1965 and any individual with known risks for hepatitis C.  Skin self-exam. / Monthly.  Influenza vaccine. / Every  year.  Tetanus, diphtheria, and acellular pertussis (Tdap/Td) vaccine.** / Consult your health care provider. 1 dose of Td every 10 years.  Varicella vaccine.** / Consult your health care provider.  Zoster vaccine.** / 1 dose for adults aged 53 years or older.  Measles, mumps, rubella (MMR) vaccine.** / You need at least 1 dose of MMR if you were born in 1957 or later. You may also need a second dose.  Pneumococcal 13-valent conjugate (PCV13) vaccine.** / Consult your health care provider.  Pneumococcal polysaccharide (PPSV23) vaccine.** / 1 to 2 doses if you smoke cigarettes or if you have certain conditions.  Meningococcal vaccine.** / Consult your health care provider.  Hepatitis A vaccine.** / Consult your health care provider.  Hepatitis B vaccine.** / Consult your health care provider.  Haemophilus influenzae type b (Hib) vaccine.** / Consult your health care provider. Ages 77 and over  Blood pressure check.** / Every 1 to 2 years.  Lipid and cholesterol check.**/ Every 5 years beginning at age 85.  Lung cancer screening. / Every year if you are aged 55-80 years and have a 30-pack-year history of smoking and currently smoke or have quit within the past 15 years. Yearly screening is stopped once you have quit smoking for at least 15 years or develop a health problem that would prevent you from having lung cancer treatment.  Fecal occult blood test (FOBT) of stool. / Every year beginning at age 33 and continuing until age 11. You may not have to do this test if you get a colonoscopy every 10 years.  Flexible sigmoidoscopy** or colonoscopy.** / Every 5 years for a flexible sigmoidoscopy or every 10 years for a colonoscopy beginning at age 28 and continuing until age 73.  Hepatitis C blood  test.** / For all people born from [birth year] through 1965 and any individual with known risks for hepatitis C.  Abdominal aortic aneurysm (AAA) screening.** / A one-time screening for ages 50 to 27 years who are current or former smokers.  Skin self-exam. / Monthly.  Influenza vaccine. / Every year.  Tetanus, diphtheria, and acellular pertussis (Tdap/Td) vaccine.** / 1 dose of Td every 10 years.  Varicella vaccine.** / Consult your health care provider.  Zoster vaccine.** / 1 dose for adults aged 34 years or older.  Pneumococcal 13-valent conjugate (PCV13) vaccine.** / Consult your health care provider.  Pneumococcal polysaccharide (PPSV23) vaccine.** / 1 dose for all adults aged 63 years and older.  Meningococcal vaccine.** / Consult your health care provider.  Hepatitis A vaccine.** / Consult your health care provider.  Hepatitis B vaccine.** / Consult your health care provider.  Haemophilus influenzae type b (Hib) vaccine.** / Consult your health care provider. **Family history and personal history of risk and conditions may change your health care provider's recommendations. Document Released: 02/10/2002 Document Revised: 12/20/2013 Document Reviewed: 05/12/2011 [facility] Patient Information 2015 [place], [place]. This information is not intended to replace advice given to you by your health care provider. Make sure you discuss any questions you have with your health care provider.

## 2014-07-31 NOTE — Progress Notes (Signed)
Subjective:    Patient ID: Ronald Petersen, male    DOB: Mar 14, 1956, 58 y.o.   MRN: [id number]  HPI Pt here for cpe and labs.  No complaints.     Review of Systems  Constitutional: Negative.   HENT: Negative for congestion, ear pain, hearing loss, nosebleeds, postnasal drip, rhinorrhea, sinus pressure, sneezing and tinnitus.   Eyes: Negative for photophobia, discharge, itching and visual disturbance.  Respiratory: Negative.   Cardiovascular: Negative.   Gastrointestinal: Negative for abdominal pain, constipation, blood in stool, abdominal distention and anal bleeding.  Endocrine: Negative.   Genitourinary: Negative.   Musculoskeletal: Negative.   Skin: Negative.   Allergic/Immunologic: Negative.   Neurological: Negative for dizziness, weakness, light-headedness, numbness and headaches.  Psychiatric/Behavioral: Negative for suicidal ideas, confusion, sleep disturbance, dysphoric mood, decreased concentration and agitation. The patient is not nervous/anxious.    Past Medical History  Diagnosis Date  . Hyperlipidemia   . Depression   . Diabetes mellitus   . Hypertension    History   Social History  . Marital Status: Married    Spouse Name: N/A    Number of Children: N/A  . Years of Education: N/A   Occupational History  . unemployed    Social History Main Topics  . Smoking status: Former Smoker    Types: Cigarettes  . Smokeless tobacco: Never Used  . Alcohol Use: 2.4 - 3.0 oz/week    4-5 Cans of beer per week     Comment: daily  . Drug Use: No  . Sexual Activity: Yes    Partners: Female   Other Topics Concern  . Not on file   Social History Narrative   Exercise-- 3 days   Family History  Problem Relation Age of Onset  . Alcohol abuse    . Depression    . Arthritis    . Hypertension    . Ovarian cancer Sister   . Stomach cancer Sister   . Stomach cancer Maternal Grandmother   . COPD Mother   . Stroke Father   . Coronary artery disease     History     Social History  . Marital Status: Married    Spouse Name: N/A    Number of Children: N/A  . Years of Education: N/A   Occupational History  . unemployed    Social History Main Topics  . Smoking status: Former Smoker    Types: Cigarettes  . Smokeless tobacco: Never Used  . Alcohol Use: 2.4 - 3.0 oz/week    4-5 Cans of beer per week     Comment: daily  . Drug Use: No  . Sexual Activity: Yes    Partners: Female   Other Topics Concern  . Not on file   Social History Narrative   Exercise-- 3 days   Past Surgical History  Procedure Laterality Date  . Appendectomy    . Cholecystectomy    . Lumbar laminectomy     Current Outpatient Prescriptions  Medication Sig Dispense Refill  . gabapentin (NEURONTIN) 100 MG capsule TAKE ONE CAPSULE BY MOUTH THREE TIMES DAILY  90 capsule  0  . HYDROcodone-acetaminophen (NORCO) 5-325 MG per tablet Take 1 tablet by mouth every 6 (six) hours as needed for moderate pain.  60 tablet  0  . Insulin Pen Needle 32G X 6 MM MISC 1.8 mg by Does not apply route daily.  100 each  1  . Liraglutide (VICTOZA) 18 MG/3ML SOLN injection Inject 1.2 mg into the skin  daily.       . metFORMIN (GLUCOPHAGE-XR) 500 MG 24 hr tablet TAKE TWO TABLETS BY MOUTH EVERY DAY WITH  BREAKFAST  60 tablet  0  . omeprazole (PRILOSEC) 20 MG capsule Take 20 mg by mouth daily.      . piroxicam (FELDENE) 20 MG capsule Take 1 capsule (20 mg total) by mouth daily.  90 capsule  3  . atorvastatin (LIPITOR) 20 MG tablet Take 1 tablet (20 mg total) by mouth daily.  30 tablet  2  . sertraline (ZOLOFT) 100 MG tablet 1 1/2 tab po qd  45 tablet  11   No current facility-administered medications for this visit.       Objective:   Physical Exam  Constitutional: He is oriented to person, place, and time. He appears well-developed and well-nourished. No distress.  HENT:  Head: Normocephalic and atraumatic.  Right Ear: External ear normal.  Left Ear: External ear normal.  Nose: Nose normal.   Mouth/Throat: Oropharynx is clear and moist. No oropharyngeal exudate.  Eyes: Conjunctivae and EOM are normal. Pupils are equal, round, and reactive to light. Right eye exhibits no discharge. Left eye exhibits no discharge.  Neck: Normal range of motion. Neck supple. No JVD present. No thyromegaly present.  Cardiovascular: Normal rate, regular rhythm and intact distal pulses.  Exam reveals no gallop and no friction rub.   No murmur heard. Pulmonary/Chest: Effort normal and breath sounds normal. No respiratory distress. He has no wheezes. He has no rales. He exhibits no tenderness.  Abdominal: Soft. Bowel sounds are normal. He exhibits no distension and no mass. There is no tenderness. There is no rebound and no guarding.  + ventral hernia-- no pain, reducible  Genitourinary: Rectum normal, prostate normal and penis normal. Guaiac negative stool.  Musculoskeletal: Normal range of motion. He exhibits no edema and no tenderness.  Lymphadenopathy:    He has no cervical adenopathy.  Neurological: He is alert and oriented to person, place, and time. He displays normal reflexes. He exhibits normal muscle tone.  Skin: Skin is warm and dry. No rash noted. He is not diaphoretic. No erythema. No pallor.  Psychiatric: He has a normal mood and affect. His behavior is normal. Judgment and thought content normal.          Assessment & Plan:  1. Generalized anxiety disorder Inc med to 1 1/2 tab daily--- rto prn - sertraline (ZOLOFT) 100 MG tablet; 1 1/2 tab po qd  Dispense: 45 tablet; Refill: 11  2. Type II or unspecified type diabetes mellitus with peripheral circulatory disorders, uncontrolled(250.72) Check labs, con't meds - Hemoglobin A1c - Hepatic function panel - Lipid panel - POCT urinalysis dipstick  3. Other and unspecified hyperlipidemia Check labs, con' t meds - Hemoglobin A1c - Hepatic function panel - Lipid panel - POCT urinalysis dipstick  4. Essential hypertension Stable,  con't meds - Basic metabolic panel - CBC with Differential - Hemoglobin A1c - Hepatic function panel - Lipid panel - POCT urinalysis dipstick  5. Preventative health care Check labs, see avs - Basic metabolic panel - CBC with Differential - Hemoglobin A1c - Hepatic function panel - Lipid panel - POCT urinalysis dipstick  6. Need for diphtheria-tetanus-pertussis (Tdap) vaccine, adult/adolescent  - Tdap vaccine greater than or equal to 59yo IM

## 2014-08-01 ENCOUNTER — Telehealth: Payer: Self-pay | Admitting: Family Medicine

## 2014-08-01 NOTE — Telephone Encounter (Signed)
Relevant patient education assigned to patient using Emmi. ° °

## 2014-08-02 ENCOUNTER — Telehealth: Payer: Self-pay

## 2014-08-02 NOTE — Telephone Encounter (Signed)
Spoke with patient and he stated he is not taking the Lipitor, he said it is too expensive and he does not have insurance. He is willing to try something that is cheaper. He is going to be using the [employer].  Please advise     KP

## 2014-08-02 NOTE — Telephone Encounter (Signed)
Message copied by Ewing Schlein on Wed Aug 02, 2014  2:09 PM ------      Message from: Rosalita Chessman      Created: Mon Jul 31, 2014  5:43 PM       Cholesterol--- LDL goal < 70,  HDL >40,  TG < 150.  Diet and exercise will increase HDL and decrease LDL and TG.  Fish,  Fish Oil, Flaxseed oil will also help increase the HDL and decrease Triglycerides.   Recheck labs in 3 months--- inc to lipitor 40 mg #30  2 refills      Dm not controlled-- inc victoza to 1.8 mg , con't metformin      Recheck 3 months    272.4  250.01 lipid, hep, bmp, hgba1c, microalbumin.             ------

## 2014-08-03 ENCOUNTER — Other Ambulatory Visit: Payer: Self-pay | Admitting: Family Medicine

## 2014-08-03 DIAGNOSIS — G8929 Other chronic pain: Secondary | ICD-10-CM

## 2014-08-03 MED ORDER — LOVASTATIN 40 MG PO TABS: 40.0000 mg | ORAL_TABLET | Freq: Every day | ORAL | Status: DC

## 2014-08-03 MED ORDER — OXYCODONE-ACETAMINOPHEN 5-325 MG PO TABS: 1.0000 | ORAL_TABLET | Freq: Three times a day (TID) | ORAL | Status: DC | PRN

## 2014-08-03 NOTE — Telephone Encounter (Signed)
Patient aware Oxycodone ready for pick up.      KP

## 2014-08-03 NOTE — Telephone Encounter (Signed)
[facility] ---- he should see how much Lipitor will be there If he has and its too expensive-- the only cheaper one is lovastatin 40 mg 1 po qd   #90  1 refill

## 2014-08-03 NOTE — Telephone Encounter (Signed)
Spoke with patient he will take the Lovastatin 40 mg. Rx sent to the [facility]. He also stated that he is supposed to get Oxycodone but Dr.Lowne gave him Hydrocodone. Hydrocodone is not strong enough for his pain. Please advise      KP

## 2014-08-03 NOTE — Telephone Encounter (Signed)
Hydrocodone was what was on he med list---- change to oxycodone---rx printed

## 2014-08-04 ENCOUNTER — Telehealth: Payer: Self-pay | Admitting: Family Medicine

## 2014-08-04 DIAGNOSIS — F411 Generalized anxiety disorder: Secondary | ICD-10-CM

## 2014-08-04 MED ORDER — SERTRALINE HCL 100 MG PO TABS: ORAL_TABLET | ORAL | Status: DC

## 2014-08-04 MED ORDER — METFORMIN HCL ER 500 MG PO TB24: ORAL_TABLET | ORAL | Status: DC

## 2014-08-04 NOTE — Telephone Encounter (Signed)
Spoke with patient and he stated that the dose of the Gabapentin was supposed to be increased. Please advise     KP

## 2014-08-04 NOTE — Telephone Encounter (Signed)
Can increase to 2 cap tid

## 2014-08-04 NOTE — Telephone Encounter (Signed)
Pt would like to know the status of he's script sertraline (ZOLOFT) 100 MG tablet. [facility]. Pt would like discuss medication  mobile [phone]

## 2014-08-07 MED ORDER — GABAPENTIN 100 MG PO CAPS: ORAL_CAPSULE | ORAL | Status: DC

## 2014-08-07 NOTE — Telephone Encounter (Signed)
[facility] patient request.     KP

## 2014-08-25 ENCOUNTER — Emergency Department (HOSPITAL_BASED_OUTPATIENT_CLINIC_OR_DEPARTMENT_OTHER): Payer: Self-pay

## 2014-08-25 ENCOUNTER — Emergency Department (HOSPITAL_BASED_OUTPATIENT_CLINIC_OR_DEPARTMENT_OTHER)
Admission: EM | Admit: 2014-08-25 | Discharge: 2014-08-25 | Disposition: A | Discharge status: Home / Self Care | Payer: Self-pay | Attending: Emergency Medicine | Admitting: Emergency Medicine

## 2014-08-25 ENCOUNTER — Encounter (HOSPITAL_BASED_OUTPATIENT_CLINIC_OR_DEPARTMENT_OTHER): Payer: Self-pay | Admitting: Emergency Medicine

## 2014-08-25 DIAGNOSIS — E785 Hyperlipidemia, unspecified: Secondary | ICD-10-CM | POA: Insufficient documentation

## 2014-08-25 DIAGNOSIS — Z79899 Other long term (current) drug therapy: Secondary | ICD-10-CM | POA: Insufficient documentation

## 2014-08-25 DIAGNOSIS — M25561 Pain in right knee: Secondary | ICD-10-CM

## 2014-08-25 DIAGNOSIS — Y92838 Other recreation area as the place of occurrence of the external cause: Secondary | ICD-10-CM

## 2014-08-25 DIAGNOSIS — E119 Type 2 diabetes mellitus without complications: Secondary | ICD-10-CM | POA: Insufficient documentation

## 2014-08-25 DIAGNOSIS — R296 Repeated falls: Secondary | ICD-10-CM | POA: Insufficient documentation

## 2014-08-25 DIAGNOSIS — Z794 Long term (current) use of insulin: Secondary | ICD-10-CM | POA: Insufficient documentation

## 2014-08-25 DIAGNOSIS — S99919A Unspecified injury of unspecified ankle, initial encounter: Principal | ICD-10-CM

## 2014-08-25 DIAGNOSIS — S99929A Unspecified injury of unspecified foot, initial encounter: Principal | ICD-10-CM

## 2014-08-25 DIAGNOSIS — I10 Essential (primary) hypertension: Secondary | ICD-10-CM | POA: Insufficient documentation

## 2014-08-25 DIAGNOSIS — Z87891 Personal history of nicotine dependence: Secondary | ICD-10-CM | POA: Insufficient documentation

## 2014-08-25 DIAGNOSIS — F3289 Other specified depressive episodes: Secondary | ICD-10-CM | POA: Insufficient documentation

## 2014-08-25 DIAGNOSIS — S8990XA Unspecified injury of unspecified lower leg, initial encounter: Secondary | ICD-10-CM | POA: Insufficient documentation

## 2014-08-25 DIAGNOSIS — Y9367 Activity, basketball: Secondary | ICD-10-CM | POA: Insufficient documentation

## 2014-08-25 DIAGNOSIS — Y9239 Other specified sports and athletic area as the place of occurrence of the external cause: Secondary | ICD-10-CM | POA: Insufficient documentation

## 2014-08-25 DIAGNOSIS — F329 Major depressive disorder, single episode, unspecified: Secondary | ICD-10-CM | POA: Insufficient documentation

## 2014-08-25 MED ORDER — OXYCODONE-ACETAMINOPHEN 5-325 MG PO TABS: 1.0000 | ORAL_TABLET | Freq: Four times a day (QID) | ORAL | Status: DC | PRN

## 2014-08-25 MED ORDER — HYDROCODONE-ACETAMINOPHEN 5-325 MG PO TABS: 1.0000 | ORAL_TABLET | Freq: Four times a day (QID) | ORAL | Status: DC | PRN

## 2014-08-25 NOTE — ED Notes (Signed)
No known injury   Pain rt knee  x4-5 weeks

## 2014-08-25 NOTE — Discharge Instructions (Signed)

## 2014-08-25 NOTE — ED Provider Notes (Signed)
CSN: [id number]     Arrival date & time 08/25/14  1558 History   First MD Initiated Contact with Patient 08/25/14 1727     Chief Complaint  Patient presents with  . Knee Pain     (Consider location/radiation/quality/duration/timing/severity/associated sxs/prior Treatment) Patient is a 58 y.o. male presenting with knee pain. The history is provided by the patient.  Knee Pain Location:  Knee Time since incident:  4 weeks (No fall or injury) Injury: yes   Mechanism of injury: fall   Fall:    Fall occurred:  Recreating/playing   Impact surface:  Concrete Knee location:  R knee Pain details:    Quality:  Sharp   Radiates to:  Does not radiate   Severity:  Moderate   Onset quality:  Gradual   Timing:  Constant   Progression:  Worsening Chronicity:  New Dislocation: no   Prior injury to area:  No Relieved by:  Nothing Worsened by:  Nothing tried Associated symptoms: no back pain, no decreased ROM, no fatigue, no fever, no muscle weakness, no numbness, no stiffness, no swelling and no tingling     Past Medical History  Diagnosis Date  . Hyperlipidemia   . Depression   . Diabetes mellitus   . Hypertension    Past Surgical History  Procedure Laterality Date  . Appendectomy    . Cholecystectomy    . Lumbar laminectomy     Family History  Problem Relation Age of Onset  . Alcohol abuse    . Depression    . Arthritis    . Hypertension    . Ovarian cancer Sister   . Stomach cancer Sister   . Stomach cancer Maternal Grandmother   . COPD Mother   . Stroke Father   . Coronary artery disease     History  Substance Use Topics  . Smoking status: Former Smoker    Types: Cigarettes  . Smokeless tobacco: Never Used  . Alcohol Use: 2.4 - 3.0 oz/week    4-5 Cans of beer per week     Comment: daily    Review of Systems  Constitutional: Negative for fever and fatigue.  Musculoskeletal: Negative for back pain and stiffness.  All other systems reviewed and are  negative.     Allergies  Niacin  Home Medications   Prior to Admission medications   Medication Sig Start Date End Date Taking? Authorizing Provider  gabapentin (NEURONTIN) 100 MG capsule TAKE TWO CAPSULE BY MOUTH THREE TIMES DAILY 08/07/14   Rosalita Chessman, DO  Insulin Pen Needle 32G X 6 MM MISC 1.8 mg by Does not apply route daily. 05/18/13   Rosalita Chessman, DO  Liraglutide (VICTOZA) 18 MG/3ML SOLN injection Inject 1.8 mg into the skin daily.  11/12/12   Rosalita Chessman, DO  lovastatin (MEVACOR) 40 MG tablet Take 1 tablet (40 mg total) by mouth at bedtime. 08/03/14   Rosalita Chessman, DO  metFORMIN (GLUCOPHAGE-XR) 500 MG 24 hr tablet TAKE TWO TABLETS BY MOUTH EVERY DAY WITH  BREAKFAST 08/04/14   Rosalita Chessman, DO  omeprazole (PRILOSEC) 20 MG capsule Take 20 mg by mouth daily.    Historical Provider, MD  oxyCODONE-acetaminophen (ROXICET) 5-325 MG per tablet Take 1 tablet by mouth every 8 (eight) hours as needed for severe pain. 08/03/14   Rosalita Chessman, DO  piroxicam (FELDENE) 20 MG capsule Take 1 capsule (20 mg total) by mouth daily. 10/25/12   Rosalita Chessman, DO  sertraline (ZOLOFT)  100 MG tablet 1 1/2 tab po qd 08/04/14   Yvonne R Lowne, DO   BP 142/102  Pulse 89  Temp(Src) 97.8 F (36.6 C) (Oral)  Resp 20  Ht 6' (1.829 m)  Wt 225 lb (102.059 kg)  BMI 30.51 kg/m2  SpO2 99% Physical Exam  Constitutional: He is oriented to person, place, and time. He appears well-developed and well-nourished. No distress.  HENT:  Head: Normocephalic and atraumatic.  Mouth/Throat: No oropharyngeal exudate.  Eyes: EOM are normal. Pupils are equal, round, and reactive to light.  Neck: Normal range of motion. Neck supple.  Cardiovascular: Normal rate and regular rhythm.  Exam reveals no friction rub.   No murmur heard. Pulmonary/Chest: Effort normal and breath sounds normal. No respiratory distress. He has no wheezes. He has no rales.  Abdominal: He exhibits no distension. There is no tenderness. There is  no rebound.  Musculoskeletal: Normal range of motion. He exhibits no edema.       Right knee: He exhibits normal range of motion, no swelling, no effusion, no ecchymosis, no deformity, no laceration, no erythema, normal alignment and no LCL laxity. Tenderness found. Medial joint line tenderness noted. No lateral joint line, no MCL, no LCL and no patellar tendon tenderness noted.  Neurological: He is alert and oriented to person, place, and time.  Skin: He is not diaphoretic.    ED Course  Procedures (including critical care time) Labs Review Labs Reviewed - No data to display  Imaging Review Dg Knee 1-2 Views Right  08/25/2014   CLINICAL DATA:  Pain  EXAM: RIGHT KNEE - 1-2 VIEW  COMPARISON:  None.  FINDINGS: Frontal and lateral views were obtained. There is no fracture, dislocation, or effusion. There is slight narrowing medially. There is a minimal spur along the anterior superior patella. No erosive change.  IMPRESSION: Slight osteoarthritic change.  No fracture or effusion.   Electronically Signed   By: Lowella Grip M.D.   On: 08/25/2014 16:53     EKG Interpretation None      MDM   Final diagnoses:  Knee pain, acute, right    -year-old male here with right knee pain. Had injury playing basketball 4-5 weeks ago. Reduce. She worsened overnight and was very badly waking up this morning. Has full range of motion but is limping and having difficulty moving around. On exam no swelling, deformity. Mild medial joint pain tenderness. No lateral joint pain tenderness. No effusion. Normal range of motion. X-ray is normal. Cannot twist or the knee, concern for possible meniscal injury. Instructed to followup with sports medicine; given pain medicine.    Evelina Bucy, MD 08/25/14 [phone]

## 2015-01-15 ENCOUNTER — Ambulatory Visit: Payer: Self-pay | Admitting: Family Medicine

## 2015-01-22 ENCOUNTER — Telehealth: Payer: Self-pay

## 2015-01-22 ENCOUNTER — Other Ambulatory Visit: Payer: Self-pay | Admitting: Family Medicine

## 2015-01-22 DIAGNOSIS — E1165 Type 2 diabetes mellitus with hyperglycemia: Secondary | ICD-10-CM

## 2015-01-22 DIAGNOSIS — IMO0002 Reserved for concepts with insufficient information to code with codable children: Secondary | ICD-10-CM

## 2015-01-22 NOTE — Telephone Encounter (Signed)
Needs eye doctor referral per insurance plan. DM  OK?

## 2015-01-22 NOTE — Telephone Encounter (Signed)
Referral in

## 2015-01-23 NOTE — Telephone Encounter (Signed)
Patient notified

## 2015-01-25 ENCOUNTER — Telehealth: Payer: Self-pay | Admitting: Family Medicine

## 2015-01-25 DIAGNOSIS — G8929 Other chronic pain: Secondary | ICD-10-CM

## 2015-01-25 NOTE — Telephone Encounter (Signed)
Last seen 07/31/14 and 08/03/14 #30 UDS 03/14/13 low risk   Please advise      KP

## 2015-01-25 NOTE — Telephone Encounter (Signed)
Refill x1 

## 2015-01-25 NOTE — Telephone Encounter (Signed)
Caller name: [place] Relation to pt: self Call back number: [phone] Pharmacy:  Reason for call:   Patient requesting a new oxycodone rx. Requesting #[phone]

## 2015-01-26 MED ORDER — OXYCODONE-ACETAMINOPHEN 5-325 MG PO TABS
1.0000 | ORAL_TABLET | Freq: Three times a day (TID) | ORAL | Status: DC | PRN
Start: 2015-01-26 — End: 2015-04-05

## 2015-01-26 NOTE — Telephone Encounter (Signed)
VM left advising Rx ready for pick up.     KP 

## 2015-02-06 ENCOUNTER — Other Ambulatory Visit: Payer: Self-pay | Admitting: Family Medicine

## 2015-02-06 NOTE — Telephone Encounter (Signed)
Last filled: 08/04/14 Amt:  180, 1 Last OV:  07/31/14 Next appt:  No scheduled.  Pt needs an OV.    Scheduled next appointment for 02/15/15 at 8:15 am with Dr. Etter Sjogren.    Med filled x 1 month.

## 2015-02-15 ENCOUNTER — Encounter: Payer: Self-pay | Admitting: Family Medicine

## 2015-02-15 ENCOUNTER — Ambulatory Visit (INDEPENDENT_AMBULATORY_CARE_PROVIDER_SITE_OTHER): Payer: 59 | Admitting: Family Medicine

## 2015-02-15 VITALS — BP 120/78 | HR 67 | Temp 98.6°F | Wt 223.4 lb

## 2015-02-15 DIAGNOSIS — K219 Gastro-esophageal reflux disease without esophagitis: Secondary | ICD-10-CM

## 2015-02-15 DIAGNOSIS — E785 Hyperlipidemia, unspecified: Secondary | ICD-10-CM

## 2015-02-15 DIAGNOSIS — E119 Type 2 diabetes mellitus without complications: Secondary | ICD-10-CM

## 2015-02-15 DIAGNOSIS — M722 Plantar fascial fibromatosis: Secondary | ICD-10-CM

## 2015-02-15 DIAGNOSIS — E1149 Type 2 diabetes mellitus with other diabetic neurological complication: Secondary | ICD-10-CM

## 2015-02-15 LAB — BASIC METABOLIC PANEL
BUN: 8 mg/dL (ref 6–23)
CALCIUM: 9.6 mg/dL (ref 8.4–10.5)
CHLORIDE: 107 meq/L (ref 96–112)
CO2: 26 mEq/L (ref 19–32)
CREATININE: 0.77 mg/dL (ref 0.40–1.50)
GFR: 109.67 mL/min (ref >60.00)
Glucose, Bld: 138 mg/dL — ABNORMAL HIGH (ref 70–99)
Potassium: 4.1 mEq/L (ref 3.5–5.1)
Sodium: 139 mEq/L (ref 135–145)

## 2015-02-15 LAB — MICROALBUMIN / CREATININE URINE RATIO
CREATININE, U: 91 mg/dL
MICROALB UR: 0.7 mg/dL (ref 0.0–1.9)
Microalb Creat Ratio: 0.8 mg/g (ref 0.0–30.0)

## 2015-02-15 LAB — HEPATIC FUNCTION PANEL
ALT: 25 U/L (ref 0–53)
AST: 20 U/L (ref 0–37)
Albumin: 4.4 g/dL (ref 3.5–5.2)
Alkaline Phosphatase: 84 U/L (ref 39–117)
Bilirubin, Direct: 0.1 mg/dL (ref 0.0–0.3)
Total Bilirubin: 0.5 mg/dL (ref 0.2–1.2)
Total Protein: 7.5 g/dL (ref 6.0–8.3)

## 2015-02-15 LAB — HEMOGLOBIN A1C: Hgb A1c MFr Bld: 6.8 % — ABNORMAL HIGH (ref 4.6–6.5)

## 2015-02-15 LAB — LIPID PANEL
CHOL/HDL RATIO: 6
CHOLESTEROL: 198 mg/dL (ref 0–200)
HDL: 32.6 mg/dL — AB (ref >39.00)
NonHDL: 165.4
Triglycerides: 259 mg/dL — ABNORMAL HIGH (ref 0.0–149.0)
VLDL: 51.8 mg/dL — ABNORMAL HIGH (ref 0.0–40.0)

## 2015-02-15 LAB — POCT URINALYSIS DIPSTICK
BILIRUBIN UA: NEGATIVE
Blood, UA: NEGATIVE
Ketones, UA: NEGATIVE
Leukocytes, UA: NEGATIVE
NITRITE UA: NEGATIVE
Protein, UA: NEGATIVE
Spec Grav, UA: 1.025
Urobilinogen, UA: NEGATIVE
pH, UA: 5.5

## 2015-02-15 LAB — LDL CHOLESTEROL, DIRECT: LDL DIRECT: 116 mg/dL

## 2015-02-15 MED ORDER — OMEPRAZOLE 20 MG PO CPDR: 20.0000 mg | DELAYED_RELEASE_CAPSULE | Freq: Every day | ORAL | Status: DC

## 2015-02-15 MED ORDER — METFORMIN HCL ER 500 MG PO TB24: ORAL_TABLET | ORAL | Status: DC

## 2015-02-15 NOTE — Progress Notes (Signed)
Subjective:    Patient ID: Ronald Petersen, male    DOB: 03/20/1956, 59 y.o.   MRN: [id number]  HPI  Patient here for f/u dm and cholesterol.  Pt also c/o plantar fascitis-- he is requesting a podiatry referral.    DIABETES  Blood Sugar ranges-120s  Polyuria- no  New Visual problems- no Hypoglycemic symptoms- no Other side effects-no Medication compliance - good Last eye exam- Jan 2016 Foot exam- today  HYPERLIPIDEMIA  Medication compliance- good RUQ pain- no  Muscle aches- no Other side effects-no   Past Medical History  Diagnosis Date  . Hyperlipidemia   . Depression   . Diabetes mellitus   . Hypertension    Current Outpatient Prescriptions  Medication Sig Dispense Refill  . gabapentin (NEURONTIN) 100 MG capsule TAKE TWO CAPSULE BY MOUTH THREE TIMES DAILY 180 capsule 5  . Insulin Pen Needle 32G X 6 MM MISC 1.8 mg by Does not apply route daily. 100 each 1  . Liraglutide (VICTOZA) 18 MG/3ML SOLN injection Inject 1.8 mg into the skin daily.     Marland Kitchen lovastatin (MEVACOR) 40 MG tablet Take 1 tablet (40 mg total) by mouth at bedtime. 90 tablet 1  . metFORMIN (GLUCOPHAGE-XR) 500 MG 24 hr tablet TAKE TWO TABLETS BY MOUTH DAILY WITH BREAKFAST 180 tablet 1  . omeprazole (PRILOSEC) 20 MG capsule Take 1 capsule (20 mg total) by mouth daily. 90 capsule 1  . oxyCODONE-acetaminophen (ROXICET) 5-325 MG per tablet Take 1 tablet by mouth every 8 (eight) hours as needed for severe pain. 60 tablet 0  . piroxicam (FELDENE) 20 MG capsule Take 1 capsule (20 mg total) by mouth daily. 90 capsule 3  . sertraline (ZOLOFT) 100 MG tablet 1 1/2 tab po qd 135 tablet 3   No current facility-administered medications for this visit.   History   Social History  . Marital Status: Married    Spouse Name: N/A  . Number of Children: N/A  . Years of Education: N/A   Occupational History  . unemployed    Social History Main Topics  . Smoking status: Former Smoker    Types: Cigarettes  .  Smokeless tobacco: Never Used  . Alcohol Use: 2.4 - 3.0 oz/week    4-5 Cans of beer per week     Comment: daily  . Drug Use: No  . Sexual Activity:    Partners: Female   Other Topics Concern  . Not on file   Social History Narrative   Exercise-- 3 days     Review of Systems  Constitutional: Negative for diaphoresis, appetite change, fatigue and unexpected weight change.  Eyes: Negative for pain, redness and visual disturbance.  Respiratory: Negative for cough, chest tightness, shortness of breath and wheezing.   Cardiovascular: Negative for chest pain, palpitations and leg swelling.  Endocrine: Negative for cold intolerance, heat intolerance, polydipsia, polyphagia and polyuria.  Genitourinary: Negative for dysuria, frequency and difficulty urinating.  Neurological: Negative for dizziness, light-headedness, numbness and headaches.  Psychiatric/Behavioral: Negative for hallucinations, behavioral problems, confusion, sleep disturbance, dysphoric mood, decreased concentration and agitation. The patient is not nervous/anxious and is not hyperactive.        Objective:    Physical Exam  Constitutional: He is oriented to person, place, and time. Vital signs are normal. He appears well-developed and well-nourished. He is sleeping.  HENT:  Head: Normocephalic and atraumatic.  Mouth/Throat: Oropharynx is clear and moist.  Eyes: EOM are normal. Pupils are equal, round, and reactive to light.  Neck: Normal range of motion. Neck supple. No thyromegaly present.  Cardiovascular: Normal rate and regular rhythm.   No murmur heard. Pulmonary/Chest: Effort normal and breath sounds normal. No respiratory distress. He has no wheezes. He has no rales. He exhibits no tenderness.  Musculoskeletal: He exhibits no edema or tenderness.  Neurological: He is alert and oriented to person, place, and time.  Skin: Skin is warm and dry.  Psychiatric: He has a normal mood and affect. His behavior is normal.  Judgment and thought content normal.  Sensory exam of the foot is normal, tested with the monofilament. Good pulses, no lesions or ulcers, good peripheral pulses.  BP 120/78 mmHg  Pulse 67  Temp(Src) 98.6 F (37 C) (Oral)  Wt 223 lb 6.4 oz (101.334 kg)  SpO2 96% Wt Readings from Last 3 Encounters:  02/15/15 223 lb 6.4 oz (101.334 kg)  08/25/14 225 lb (102.059 kg)  07/31/14 229 lb 12.8 oz (104.237 kg)     Lab Results  Component Value Date   WBC 5.7 07/31/2014   HGB 14.5 07/31/2014   HCT 42.5 07/31/2014   PLT 169.0 07/31/2014   GLUCOSE 123* 07/31/2014   CHOL 199 07/31/2014   TRIG 253.0* 07/31/2014   HDL 30.10* 07/31/2014   LDLDIRECT 149.3 07/31/2014   LDLCALC 124* 01/19/2009   ALT 24 07/31/2014   AST 22 07/31/2014   NA 134* 07/31/2014   K 3.8 07/31/2014   CL 102 07/31/2014   CREATININE 0.8 07/31/2014   BUN 13 07/31/2014   CO2 24 07/31/2014   TSH 0.97 10/25/2012   PSA 0.97 10/25/2012   HGBA1C 7.9* 07/31/2014   MICROALBUR 0.8 02/02/2013    Dg Knee 1-2 Views Right  08/25/2014   CLINICAL DATA:  Pain  EXAM: RIGHT KNEE - 1-2 VIEW  COMPARISON:  None.  FINDINGS: Frontal and lateral views were obtained. There is no fracture, dislocation, or effusion. There is slight narrowing medially. There is a minimal spur along the anterior superior patella. No erosive change.  IMPRESSION: Slight osteoarthritic change.  No fracture or effusion.   Electronically Signed   By: Lowella Grip M.D.   On: 08/25/2014 16:53       Assessment & Plan:   Problem List Items Addressed This Visit    Plantar fasciitis, bilateral    Podiatry referral  Gel inserts Stretching       Relevant Orders   Ambulatory referral to Podiatry   Hyperlipidemia LDL goal <70    con't mevacor Check labs      Relevant Orders   Hepatic function panel   Lipid panel   Microalbumin / creatinine urine ratio   POCT urinalysis dipstick   DM (diabetes mellitus) type II controlled, neurological manifestation     con't metformin and victoza Check labs      Relevant Medications   metFORMIN (GLUCOPHAGE-XR) 24 hr tablet    Other Visit Diagnoses    Gastroesophageal reflux disease, esophagitis presence not specified    -  Primary    Relevant Medications    omeprazole (PRILOSEC) capsule    DM II (diabetes mellitus, type II), controlled        Relevant Medications    metFORMIN (GLUCOPHAGE-XR) 24 hr tablet    Other Relevant Orders    Basic metabolic panel    Hemoglobin A1c    Microalbumin / creatinine urine ratio    POCT urinalysis dipstick    Ambulatory referral to [place], DO

## 2015-02-15 NOTE — Patient Instructions (Signed)
Diabetes and Standards of Medical Care Diabetes is complicated. You may find that your diabetes team includes a dietitian, nurse, diabetes educator, eye doctor, and more. To help everyone know what is going on and to help you get the care you deserve, the following schedule of care was developed to help keep you on track. Below are the tests, exams, vaccines, medicines, education, and plans you will need. HbA1c test This test shows how well you have controlled your glucose over the past 2-3 months. It is used to see if your diabetes management plan needs to be adjusted.   It is performed at least 2 times a year if you are meeting treatment goals.  It is performed 4 times a year if therapy has changed or if you are not meeting treatment goals. Blood pressure test  This test is performed at every routine medical visit. The goal is less than 140/90 mm Hg for most people, but 130/80 mm Hg in some cases. Ask your health care provider about your goal. Dental exam  Follow up with the dentist regularly. Eye exam  If you are diagnosed with type 1 diabetes as a child, get an exam upon reaching the age of 37 years or older and have had diabetes for 3-5 years. Yearly eye exams are recommended after that initial eye exam.  If you are diagnosed with type 1 diabetes as an adult, get an exam within 5 years of diagnosis and then yearly.  If you are diagnosed with type 2 diabetes, get an exam as soon as possible after the diagnosis and then yearly. Foot care exam  Visual foot exams are performed at every routine medical visit. The exams check for cuts, injuries, or other problems with the feet.  A comprehensive foot exam should be done yearly. This includes visual inspection as well as assessing foot pulses and testing for loss of sensation.  Check your feet nightly for cuts, injuries, or other problems with your feet. Tell your health care provider if anything is not healing. Kidney function test (urine  microalbumin)  This test is performed once a year.  Type 1 diabetes: The first test is performed 5 years after diagnosis.  Type 2 diabetes: The first test is performed at the time of diagnosis.  A serum creatinine and estimated glomerular filtration rate (eGFR) test is done once a year to assess the level of chronic kidney disease (CKD), if present. Lipid profile (cholesterol, HDL, LDL, triglycerides)  Performed every 5 years for most people.  The goal for LDL is less than 100 mg/dL. If you are at high risk, the goal is less than 70 mg/dL.  The goal for HDL is 40 mg/dL-50 mg/dL for men and 50 mg/dL-60 mg/dL for women. An HDL cholesterol of 60 mg/dL or higher gives some protection against heart disease.  The goal for triglycerides is less than 150 mg/dL. Influenza vaccine, pneumococcal vaccine, and hepatitis B vaccine  The influenza vaccine is recommended yearly.  It is recommended that people with diabetes who are over 24 years old get the pneumonia vaccine. In some cases, two separate shots may be given. Ask your health care provider if your pneumonia vaccination is up to date.  The hepatitis B vaccine is also recommended for adults with diabetes. Diabetes self-management education  Education is recommended at diagnosis and ongoing as needed. Treatment plan  Your treatment plan is reviewed at every medical visit. Document Released: 10/12/2009 Document Revised: 05/01/2014 Document Reviewed: 05/17/2013 [facility] Patient Information 2015 [place],  [employer]. This information is not intended to replace advice given to you by your health care provider. Make sure you discuss any questions you have with your health care provider.

## 2015-02-15 NOTE — Progress Notes (Signed)
Pre visit review using our clinic review tool, if applicable. No additional management support is needed unless otherwise documented below in the visit note. 

## 2015-02-15 NOTE — Assessment & Plan Note (Signed)
con't metformin and victoza Check labs

## 2015-02-15 NOTE — Assessment & Plan Note (Signed)
con't mevacor Check labs

## 2015-02-15 NOTE — Assessment & Plan Note (Signed)
Podiatry referral  Gel inserts Stretching

## 2015-02-20 ENCOUNTER — Encounter: Payer: Self-pay | Admitting: Podiatry

## 2015-02-20 ENCOUNTER — Ambulatory Visit (INDEPENDENT_AMBULATORY_CARE_PROVIDER_SITE_OTHER): Payer: 59 | Admitting: Podiatry

## 2015-02-20 VITALS — BP 157/95 | HR 66 | Ht 72.0 in | Wt 223.0 lb

## 2015-02-20 DIAGNOSIS — M722 Plantar fascial fibromatosis: Secondary | ICD-10-CM

## 2015-02-20 DIAGNOSIS — M216X1 Other acquired deformities of right foot: Secondary | ICD-10-CM

## 2015-02-20 DIAGNOSIS — M216X9 Other acquired deformities of unspecified foot: Secondary | ICD-10-CM | POA: Insufficient documentation

## 2015-02-20 DIAGNOSIS — M21969 Unspecified acquired deformity of unspecified lower leg: Secondary | ICD-10-CM | POA: Insufficient documentation

## 2015-02-20 NOTE — Progress Notes (Signed)
Subjective: 59 year old male presents accompanied by his wife complaining of right foot pain from heel to toe on bottom, left foot pain from heel to top of the foot duration of 6-8 months. Has had previous episode and was treated with orthotics 4-5 years ago. He was also treated with cortisone injection that did not give him any relief. This time the orthotics are making it hurt more. On feet all day and night 10-12 hours a day. Has Diabetic Neuropathy with burning and tingling sensation on tip of toes x 4-5 years.  Been diabetic for 25 years. Blood sugar is at 130-135 range.   Review of Systems - General ROS: negative for - fatigue, fever, malaise or night sweats Ophthalmic ROS: negative ENT ROS: negative Allergy and Immunology ROS: negative Respiratory ROS: no cough, shortness of breath, or wheezing Cardiovascular ROS: no chest pain or dyspnea on exertion Gastrointestinal ROS: no abdominal pain, change in bowel habits, or black or bloody stools Genito-Urinary ROS: no dysuria, trouble voiding, or hematuria Musculoskeletal ROS: negative Neurological ROS: positive for - numbness/tingling Dermatological ROS: negative.  Objective: Dermatologic: No abnormal skin lesions noted. Vascular: All pedal pulses mare palpable. No edema or erythema noted. Neurologic: Subjective numbness and tingling sensation on distal end of all toes.  Positive response 6 out of 6 to monofilament sensory testing bilateral.  Orthopedic: Tight right Achilles tendon. Unable to flex beyond 90 degree with knee extended right lower limb. Left side has normal flexion and extension. Positive of elevated first ray right>left. Forefoot varus.  Assessment: Plantar fasciitis bilateral. Forefoot varus with elevated first ray bilateral.  Ankle equinus right foot.  Plan: Reviewed clinical findings and available treatment options. Reviewed stretch exercise for tight Achilles tendon on right. Need custom orthotics to address  forefoot varus condition and heel pain.  If Orthotics fail to improve, it may require Cotton osteotomy with bone graft to plantar flex the first ray and restore normal forefoot alignment.  Patient will return for custom orthotics.

## 2015-02-20 NOTE — Patient Instructions (Signed)
Seen for bilateral foot pain. Will benefit from stretch exercise and custom orthotics. Return for custom orthotics.

## 2015-02-21 ENCOUNTER — Other Ambulatory Visit: Payer: Self-pay

## 2015-02-21 MED ORDER — FENOFIBRATE 160 MG PO TABS: 160.0000 mg | ORAL_TABLET | Freq: Every day | ORAL | Status: DC

## 2015-03-02 ENCOUNTER — Ambulatory Visit: Payer: 59 | Admitting: Podiatry

## 2015-03-06 ENCOUNTER — Ambulatory Visit (INDEPENDENT_AMBULATORY_CARE_PROVIDER_SITE_OTHER): Payer: 59 | Admitting: Podiatry

## 2015-03-06 ENCOUNTER — Encounter: Payer: Self-pay | Admitting: Podiatry

## 2015-03-06 VITALS — BP 140/80 | HR 62

## 2015-03-06 DIAGNOSIS — M722 Plantar fascial fibromatosis: Secondary | ICD-10-CM

## 2015-03-06 DIAGNOSIS — M216X9 Other acquired deformities of unspecified foot: Secondary | ICD-10-CM | POA: Diagnosis not present

## 2015-03-06 DIAGNOSIS — M21969 Unspecified acquired deformity of unspecified lower leg: Secondary | ICD-10-CM | POA: Diagnosis not present

## 2015-03-06 NOTE — Patient Instructions (Signed)
Both feet casted for orthotics. Will call when they are ready.

## 2015-03-06 NOTE — Progress Notes (Signed)
Patient came in to have custom orthotics prepared for painful heels.  X-ray findings reveal positive of plantar calcaneal spur, elevated first ray in lateral view bilateral. AP view show normal osseous and articular arrangements. No acute changes noted. Both feet casted for orthotics.

## 2015-04-05 ENCOUNTER — Telehealth: Payer: Self-pay | Admitting: Family Medicine

## 2015-04-05 DIAGNOSIS — G8929 Other chronic pain: Secondary | ICD-10-CM

## 2015-04-05 MED ORDER — OXYCODONE-ACETAMINOPHEN 5-325 MG PO TABS: 1.0000 | ORAL_TABLET | Freq: Three times a day (TID) | ORAL | Status: DC | PRN

## 2015-04-05 NOTE — Telephone Encounter (Signed)
Last seen 02/15/15 and filled 01/26/15 #60 UDS 02/15/15 was Neg for Oxycodone    Please advise     KP

## 2015-04-05 NOTE — Telephone Encounter (Signed)
Refill x1 

## 2015-04-05 NOTE — Telephone Encounter (Signed)
Caller name:Kohan Relation to pt: self Call back number:  [phone] Pharmacy:  Reason for call:   Requesting oxycodone rx

## 2015-04-05 NOTE — Telephone Encounter (Signed)
VM left advising Rx will be ready for pickup on 04/06/15.    KP

## 2015-05-18 ENCOUNTER — Ambulatory Visit: Payer: 59 | Admitting: Podiatry

## 2015-05-22 ENCOUNTER — Telehealth: Payer: Self-pay | Admitting: Family Medicine

## 2015-05-22 ENCOUNTER — Other Ambulatory Visit: Payer: 59

## 2015-05-22 DIAGNOSIS — E785 Hyperlipidemia, unspecified: Secondary | ICD-10-CM

## 2015-05-22 DIAGNOSIS — E119 Type 2 diabetes mellitus without complications: Secondary | ICD-10-CM

## 2015-05-22 NOTE — Telephone Encounter (Signed)
Pt called to [facility] lab appointment for 05/23/15. Did not see an order please advise

## 2015-05-23 ENCOUNTER — Ambulatory Visit: Payer: 59 | Admitting: Podiatry

## 2015-05-23 ENCOUNTER — Other Ambulatory Visit: Payer: 59

## 2015-05-30 ENCOUNTER — Ambulatory Visit: Payer: 59 | Admitting: Podiatry

## 2015-06-26 ENCOUNTER — Telehealth: Payer: Self-pay | Admitting: Family Medicine

## 2015-06-26 MED ORDER — METFORMIN HCL ER 500 MG PO TB24: ORAL_TABLET | ORAL | Status: DC

## 2015-06-26 NOTE — Telephone Encounter (Signed)
Labs scheduled for 07/03/15.

## 2015-06-26 NOTE — Telephone Encounter (Signed)
Rx sent, patient is due for labs. Please schedule a lab only apt. The orders are in    [place]

## 2015-06-26 NOTE — Telephone Encounter (Signed)
Caller name: Lynette Noah Relationship to patient: self Can be reached: [phone] Pharmacy: [facility] [address]  Reason for call: Pt wanting refill of Metformin sent in to [facility] as a 90 day supply. It is less expensive for him this way. Pt has been out for 1 week.

## 2015-07-03 ENCOUNTER — Telehealth: Payer: Self-pay | Admitting: Family Medicine

## 2015-07-03 ENCOUNTER — Other Ambulatory Visit (INDEPENDENT_AMBULATORY_CARE_PROVIDER_SITE_OTHER): Payer: 59

## 2015-07-03 DIAGNOSIS — E119 Type 2 diabetes mellitus without complications: Secondary | ICD-10-CM

## 2015-07-03 DIAGNOSIS — E785 Hyperlipidemia, unspecified: Secondary | ICD-10-CM | POA: Diagnosis not present

## 2015-07-03 DIAGNOSIS — G8929 Other chronic pain: Secondary | ICD-10-CM

## 2015-07-03 LAB — HEPATIC FUNCTION PANEL
ALBUMIN: 4.3 g/dL (ref 3.5–5.2)
ALK PHOS: 81 U/L (ref 39–117)
ALT: 23 U/L (ref 0–53)
AST: 18 U/L (ref 0–37)
Bilirubin, Direct: 0 mg/dL (ref 0.0–0.3)
TOTAL PROTEIN: 7.5 g/dL (ref 6.0–8.3)
Total Bilirubin: 0.4 mg/dL (ref 0.2–1.2)

## 2015-07-03 LAB — BASIC METABOLIC PANEL
BUN: 11 mg/dL (ref 6–23)
CO2: 25 meq/L (ref 19–32)
Calcium: 9.5 mg/dL (ref 8.4–10.5)
Chloride: 105 mEq/L (ref 96–112)
Creatinine, Ser: 0.79 mg/dL (ref 0.40–1.50)
GFR: 106.33 mL/min (ref >60.00)
Glucose, Bld: 117 mg/dL — ABNORMAL HIGH (ref 70–99)
Potassium: 3.6 mEq/L (ref 3.5–5.1)
SODIUM: 140 meq/L (ref 135–145)

## 2015-07-03 LAB — LIPID PANEL
CHOLESTEROL: 187 mg/dL (ref 0–200)
HDL: 34.7 mg/dL — ABNORMAL LOW (ref >39.00)
NonHDL: 152.3
Total CHOL/HDL Ratio: 5
Triglycerides: 207 mg/dL — ABNORMAL HIGH (ref 0.0–149.0)
VLDL: 41.4 mg/dL — ABNORMAL HIGH (ref 0.0–40.0)

## 2015-07-03 LAB — LDL CHOLESTEROL, DIRECT: Direct LDL: 117 mg/dL

## 2015-07-03 LAB — HEMOGLOBIN A1C: HEMOGLOBIN A1C: 5.9 % (ref 4.6–6.5)

## 2015-07-03 MED ORDER — OXYCODONE-ACETAMINOPHEN 5-325 MG PO TABS: 1.0000 | ORAL_TABLET | Freq: Three times a day (TID) | ORAL | Status: DC | PRN

## 2015-07-03 NOTE — Telephone Encounter (Signed)
Caller name: vanessa  Relation to pt: wife Call back number: [phone] Pharmacy:  Reason for call:   Patient wife requesting a refill of oxycodone for patient

## 2015-07-03 NOTE — Telephone Encounter (Signed)
Rx printed, awaiting MD signature.  

## 2015-07-03 NOTE — Telephone Encounter (Signed)
Okay #60, no refills 

## 2015-07-03 NOTE — Telephone Encounter (Signed)
Rx placed at front desk for pick up at Pt's convenience.

## 2015-07-03 NOTE — Telephone Encounter (Signed)
Requesting: oxyCODONE-acetaminophen (ROXICET) 5-325 MG per tablet  Contract not on file UDS 02/15/2015--low risk per Dr Etter Sjogren Last OV--02/15/2015 Last Refill--04/05/2015--#60 with 0 refills  Please Advise

## 2015-07-04 NOTE — Telephone Encounter (Signed)
No answer, no voicemail.

## 2015-08-03 ENCOUNTER — Ambulatory Visit (INDEPENDENT_AMBULATORY_CARE_PROVIDER_SITE_OTHER): Payer: 59 | Admitting: Podiatry

## 2015-08-03 ENCOUNTER — Encounter: Payer: Self-pay | Admitting: Podiatry

## 2015-08-03 VITALS — BP 133/79 | HR 59

## 2015-08-03 DIAGNOSIS — M216X9 Other acquired deformities of unspecified foot: Secondary | ICD-10-CM | POA: Diagnosis not present

## 2015-08-03 DIAGNOSIS — M21969 Unspecified acquired deformity of unspecified lower leg: Secondary | ICD-10-CM

## 2015-08-03 DIAGNOSIS — M722 Plantar fascial fibromatosis: Secondary | ICD-10-CM | POA: Diagnosis not present

## 2015-08-03 DIAGNOSIS — M216X1 Other acquired deformities of right foot: Secondary | ICD-10-CM

## 2015-08-03 DIAGNOSIS — M21961 Unspecified acquired deformity of right lower leg: Secondary | ICD-10-CM

## 2015-08-03 NOTE — Patient Instructions (Signed)
Seen for right heel pain. Cortisone injection given to right heel. Return as needed.

## 2015-08-03 NOTE — Progress Notes (Signed)
Subjective: 59 year old male presents complaining of right heel pain. 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 Orthotics are helping. Heel pain has returned bad.   Objective: No change in findings since last visit.  Dermatologic: No abnormal skin lesions noted. Vascular: All pedal pulses mare palpable. No edema or erythema noted. Neurologic: Subjective numbness and tingling sensation on distal end of all toes.  Orthopedic: Tight right Achilles tendon. Unable to flex beyond 90 degree with knee extended right lower limb. Left side has normal flexion and extension. Positive of elevated first ray right>left. Forefoot varus. Pain in right heel.   Assessment: Plantar fasciitis right .  Forefoot varus with elevated first ray bilateral.  Ankle equinus right foot.  Plan: Reviewed clinical findings and available treatment options. Right heel injected with mixture of 4 mg Dexamethasone, 4 mg Triamcinolone, and 1 cc of 0.5% Marcaine plain.  Patient tolerated well without difficulty.

## 2015-08-08 ENCOUNTER — Ambulatory Visit: Payer: 59 | Admitting: Podiatry

## 2015-08-16 ENCOUNTER — Ambulatory Visit: Payer: 59 | Admitting: Family Medicine

## 2015-08-16 ENCOUNTER — Telehealth: Payer: Self-pay | Admitting: Family Medicine

## 2015-08-20 NOTE — Telephone Encounter (Signed)
No-- they don't usually no show

## 2015-08-20 NOTE — Telephone Encounter (Signed)
Pt was no show 08/16/15 8:15am, 6 months f/u appt, left msg for pt to call and reschedule, charge for no show?

## 2015-09-27 ENCOUNTER — Other Ambulatory Visit: Payer: Self-pay | Admitting: Family Medicine

## 2015-10-08 ENCOUNTER — Telehealth: Payer: Self-pay | Admitting: Family Medicine

## 2015-10-08 DIAGNOSIS — G8929 Other chronic pain: Secondary | ICD-10-CM

## 2015-10-08 MED ORDER — OXYCODONE-ACETAMINOPHEN 5-325 MG PO TABS: 1.0000 | ORAL_TABLET | Freq: Three times a day (TID) | ORAL | Status: DC | PRN

## 2015-10-08 NOTE — Telephone Encounter (Signed)
VM left advising Rx ready for pick up.     KP 

## 2015-10-08 NOTE — Telephone Encounter (Signed)
Refill x1 

## 2015-10-08 NOTE — Telephone Encounter (Signed)
Last seen 02/15/15 and filled 07/03/15 #60 UDS 02/15/15 low risk  Please advise     KP

## 2015-10-08 NOTE — Telephone Encounter (Signed)
Caller name: [name]   Relationship to patient: Self   Can be reached: [phone]  Pharmacy:  Reason for call: Pt is requesting a refill on his oxyCODONE Rx.

## 2015-10-31 ENCOUNTER — Telehealth: Payer: Self-pay | Admitting: Family Medicine

## 2015-10-31 NOTE — Telephone Encounter (Signed)
Caller name: Lorriane Shire   Relationship to patient: Spouse   Can be reached: [phone]  Pharmacy: [facility], [place]  Reason for call: pt need a refill on his Gabapentin Rx. His CPE is scheduled for: 3/24 at 9:30. Pt would like to have medication until his appt. He says that the pharmacy denied his request. Pt is currently out of his medication.

## 2015-11-01 MED ORDER — GABAPENTIN 100 MG PO CAPS: 200.0000 mg | ORAL_CAPSULE | Freq: Three times a day (TID) | ORAL | Status: DC

## 2015-11-01 NOTE — Telephone Encounter (Signed)
Rx faxed.    KP 

## 2015-11-06 ENCOUNTER — Ambulatory Visit (INDEPENDENT_AMBULATORY_CARE_PROVIDER_SITE_OTHER): Payer: 59 | Admitting: Family Medicine

## 2015-11-06 ENCOUNTER — Encounter: Payer: Self-pay | Admitting: Family Medicine

## 2015-11-06 VITALS — BP 140/80 | HR 58 | Temp 98.0°F | Ht 72.0 in | Wt 215.6 lb

## 2015-11-06 DIAGNOSIS — R110 Nausea: Secondary | ICD-10-CM | POA: Diagnosis not present

## 2015-11-06 DIAGNOSIS — F32A Depression, unspecified: Secondary | ICD-10-CM

## 2015-11-06 DIAGNOSIS — M4802 Spinal stenosis, cervical region: Secondary | ICD-10-CM

## 2015-11-06 DIAGNOSIS — R5383 Other fatigue: Secondary | ICD-10-CM

## 2015-11-06 DIAGNOSIS — E1140 Type 2 diabetes mellitus with diabetic neuropathy, unspecified: Secondary | ICD-10-CM

## 2015-11-06 DIAGNOSIS — M791 Myalgia, unspecified site: Secondary | ICD-10-CM

## 2015-11-06 DIAGNOSIS — Z114 Encounter for screening for human immunodeficiency virus [HIV]: Secondary | ICD-10-CM

## 2015-11-06 DIAGNOSIS — Z1159 Encounter for screening for other viral diseases: Secondary | ICD-10-CM

## 2015-11-06 DIAGNOSIS — R634 Abnormal weight loss: Secondary | ICD-10-CM | POA: Diagnosis not present

## 2015-11-06 DIAGNOSIS — I10 Essential (primary) hypertension: Secondary | ICD-10-CM

## 2015-11-06 DIAGNOSIS — E1139 Type 2 diabetes mellitus with other diabetic ophthalmic complication: Secondary | ICD-10-CM | POA: Diagnosis not present

## 2015-11-06 DIAGNOSIS — F329 Major depressive disorder, single episode, unspecified: Secondary | ICD-10-CM

## 2015-11-06 DIAGNOSIS — K219 Gastro-esophageal reflux disease without esophagitis: Secondary | ICD-10-CM

## 2015-11-06 DIAGNOSIS — E785 Hyperlipidemia, unspecified: Secondary | ICD-10-CM

## 2015-11-06 LAB — SEDIMENTATION RATE: SED RATE: 13 mm/h (ref 0–22)

## 2015-11-06 LAB — CBC WITH DIFFERENTIAL/PLATELET
BASOS ABS: 0 10*3/uL (ref 0.0–0.1)
Basophils Relative: 0.7 % (ref 0.0–3.0)
EOS ABS: 0.1 10*3/uL (ref 0.0–0.7)
Eosinophils Relative: 2.8 % (ref 0.0–5.0)
HCT: 45.1 % (ref 39.0–52.0)
Hemoglobin: 15.3 g/dL (ref 13.0–17.0)
LYMPHS ABS: 1.5 10*3/uL (ref 0.7–4.0)
Lymphocytes Relative: 29.9 % (ref 12.0–46.0)
MCHC: 34 g/dL (ref 30.0–36.0)
MCV: 86.7 fl (ref 78.0–100.0)
MONO ABS: 0.4 10*3/uL (ref 0.1–1.0)
Monocytes Relative: 8.1 % (ref 3.0–12.0)
NEUTROS ABS: 3 10*3/uL (ref 1.4–7.7)
NEUTROS PCT: 58.5 % (ref 43.0–77.0)
PLATELETS: 185 10*3/uL (ref 150.0–400.0)
RBC: 5.2 Mil/uL (ref 4.22–5.81)
RDW: 13 % (ref 11.5–15.5)
WBC: 5.2 10*3/uL (ref 4.0–10.5)

## 2015-11-06 LAB — COMPREHENSIVE METABOLIC PANEL
ALT: 23 U/L (ref 0–53)
AST: 16 U/L (ref 0–37)
Albumin: 4.4 g/dL (ref 3.5–5.2)
Alkaline Phosphatase: 84 U/L (ref 39–117)
BILIRUBIN TOTAL: 0.5 mg/dL (ref 0.2–1.2)
BUN: 16 mg/dL (ref 6–23)
CO2: 29 meq/L (ref 19–32)
CREATININE: 0.81 mg/dL (ref 0.40–1.50)
Calcium: 9.6 mg/dL (ref 8.4–10.5)
Chloride: 105 mEq/L (ref 96–112)
GFR: 103.19 mL/min (ref >60.00)
GLUCOSE: 142 mg/dL — AB (ref 70–99)
Potassium: 4.2 mEq/L (ref 3.5–5.1)
Sodium: 141 mEq/L (ref 135–145)
Total Protein: 7.2 g/dL (ref 6.0–8.3)

## 2015-11-06 LAB — LIPID PANEL
Cholesterol: 197 mg/dL (ref 0–200)
HDL: 36.9 mg/dL — AB (ref >39.00)
LDL Cholesterol: 128 mg/dL — ABNORMAL HIGH (ref 0–99)
NONHDL: 159.85
Total CHOL/HDL Ratio: 5
Triglycerides: 158 mg/dL — ABNORMAL HIGH (ref 0.0–149.0)
VLDL: 31.6 mg/dL (ref 0.0–40.0)

## 2015-11-06 LAB — RHEUMATOID FACTOR: Rhuematoid fact SerPl-aCnc: <10 IU/mL (ref <=14)

## 2015-11-06 LAB — POCT URINALYSIS DIPSTICK
BILIRUBIN UA: NEGATIVE
Blood, UA: NEGATIVE
Glucose, UA: 100
Ketones, UA: NEGATIVE
LEUKOCYTES UA: NEGATIVE
NITRITE UA: NEGATIVE
PH UA: 6
Protein, UA: NEGATIVE
Spec Grav, UA: >=1.03
Urobilinogen, UA: 0.2

## 2015-11-06 LAB — HEMOGLOBIN A1C: HEMOGLOBIN A1C: 6.1 % (ref 4.6–6.5)

## 2015-11-06 LAB — PSA: PSA: 1.04 ng/mL (ref 0.10–4.00)

## 2015-11-06 LAB — H. PYLORI ANTIBODY, IGG: H PYLORI IGG: NEGATIVE

## 2015-11-06 LAB — VITAMIN B12: VITAMIN B 12: 317 pg/mL (ref 211–911)

## 2015-11-06 MED ORDER — OMEPRAZOLE 20 MG PO CPDR: DELAYED_RELEASE_CAPSULE | ORAL | Status: DC

## 2015-11-06 NOTE — Assessment & Plan Note (Signed)
con't gabepentin  stable

## 2015-11-06 NOTE — Progress Notes (Signed)
Pre visit review using our clinic review tool, if applicable. No additional management support is needed unless otherwise documented below in the visit note. 

## 2015-11-06 NOTE — Patient Instructions (Signed)
Nausea, Adult °Nausea is the feeling that you have an upset stomach or have to vomit. Nausea by itself is not likely a serious concern, but it may be an early sign of more serious medical problems. As nausea gets worse, it can lead to vomiting. If vomiting develops, there is the risk of dehydration.  °CAUSES  °· Viral infections. °· Food poisoning. °· Medicines. °· Pregnancy. °· Motion sickness. °· Migraine headaches. °· Emotional distress. °· Severe pain from any source. °· Alcohol intoxication. °HOME CARE INSTRUCTIONS °· Get plenty of rest. °· Ask your caregiver about specific rehydration instructions. °· Eat small amounts of food and sip liquids more often. °· Take all medicines as told by your caregiver. °SEEK MEDICAL CARE IF: °· You have not improved after 2 days, or you get worse. °· You have a headache. °SEEK IMMEDIATE MEDICAL CARE IF:  °· You have a fever. °· You faint. °· You keep vomiting or have blood in your vomit. °· You are extremely weak or dehydrated. °· You have dark or bloody stools. °· You have severe chest or abdominal pain. °MAKE SURE YOU: °· Understand these instructions. °· Will watch your condition. °· Will get help right away if you are not doing well or get worse. °  °This information is not intended to replace advice given to you by your health care provider. Make sure you discuss any questions you have with your health care provider. °  °Document Released: 01/22/2005 Document Revised: 01/05/2015 Document Reviewed: 08/27/2011 °Elsevier Interactive Patient Education ©2016 Elsevier Inc. ° °

## 2015-11-06 NOTE — Progress Notes (Signed)
Patient ID: Ronald Petersen, male    DOB: 08-21-1956  Age: 59 y.o. MRN: [id number]    Subjective:  Subjective HPI Ronald Petersen presents for c/o weight loss and nausea .  Pt states at some point this year he thought he weighed 245---- today  215.  1 year ago he was 235 in the office.  He states he just does not feel well.  He is nauseous all the time.  He has stopped all his meds because of weight loss and nausea and because his sugars have been fine.   He states he hurts all over ---this am his hands and arms hurt so bad he could not open his bottles.  He does have a hx cervical disc degeneration and has not seen the ortho in several years.     Review of Systems  Constitutional: Positive for fatigue and unexpected weight change. Negative for diaphoresis and appetite change.  HENT: Negative for congestion, ear pain, hearing loss, postnasal drip and sinus pressure.   Eyes: Negative for pain, redness and visual disturbance.  Respiratory: Negative for cough, chest tightness, shortness of breath and wheezing.   Cardiovascular: Negative for chest pain, palpitations and leg swelling.  Gastrointestinal: Positive for nausea. Negative for vomiting, abdominal pain, diarrhea, constipation, blood in stool, abdominal distention and anal bleeding.  Endocrine: Negative for cold intolerance, heat intolerance, polydipsia, polyphagia and polyuria.  Genitourinary: Negative for dysuria, frequency and difficulty urinating.  Musculoskeletal: Positive for myalgias, back pain, joint swelling, arthralgias, neck pain and neck stiffness. Negative for gait problem.  Neurological: Negative for dizziness, light-headedness, numbness and headaches.  Psychiatric/Behavioral: Negative for decreased concentration. The patient is not nervous/anxious.     History Past Medical History  Diagnosis Date  . Hyperlipidemia   . Depression   . Diabetes mellitus   . Hypertension     He has past surgical history that includes  Appendectomy; Cholecystectomy; and Lumbar laminectomy.   His family history includes Alcohol abuse in an other family member; Arthritis in an other family member; COPD in his mother; Coronary artery disease in an other family member; Depression in an other family member; Heart disease in his sister and sister; Hypertension in an other family member; Ovarian cancer in his sister; Stomach cancer in his maternal grandmother and sister; Stroke in his father.He reports that he has quit smoking. His smoking use included Cigarettes. He has never used smokeless tobacco. He reports that he drinks about 2.4 - 3.0 oz of alcohol per week. He reports that he does not use illicit drugs.  Current Outpatient Prescriptions on File Prior to Visit  Medication Sig Dispense Refill  . gabapentin (NEURONTIN) 100 MG capsule Take 2 capsules (200 mg total) by mouth 3 (three) times daily. 180 capsule 5  . oxyCODONE-acetaminophen (ROXICET) 5-325 MG tablet Take 1 tablet by mouth every 8 (eight) hours as needed for severe pain. 60 tablet 0  . sertraline (ZOLOFT) 100 MG tablet TAKE 1&1/2 TABLETS BY MOUTH EVERYDAY 45 tablet 0   No current facility-administered medications on file prior to visit.     Objective:  Objective Physical Exam  Constitutional: He is oriented to person, place, and time. Vital signs are normal. He appears well-developed and well-nourished. He is sleeping.  HENT:  Head: Normocephalic and atraumatic.  Mouth/Throat: Oropharynx is clear and moist.  Eyes: EOM are normal. Pupils are equal, round, and reactive to light.  Neck: Normal range of motion. Neck supple. No thyromegaly present.  Cardiovascular: Normal rate and regular  rhythm.   No murmur heard. Pulmonary/Chest: Effort normal and breath sounds normal. No respiratory distress. He has no wheezes. He has no rales. He exhibits no tenderness.  Abdominal: Soft. Bowel sounds are normal. There is no tenderness.  Musculoskeletal: He exhibits no edema or  tenderness.  Neurological: He is alert and oriented to person, place, and time. He displays normal reflexes. He exhibits normal muscle tone.  Weakness in both upper ext-- R > L worse since he saw specialist over 10 years ago  Skin: Skin is warm and dry.  Psychiatric: He has a normal mood and affect. His behavior is normal. Judgment and thought content normal.   BP 140/80 mmHg  Pulse 58  Temp(Src) 98 F (36.7 C) (Oral)  Ht 6' (1.829 m)  Wt 215 lb 9.6 oz (97.796 kg)  BMI 29.23 kg/m2  SpO2 98% Wt Readings from Last 3 Encounters:  11/06/15 215 lb 9.6 oz (97.796 kg)  02/20/15 223 lb (101.152 kg)  02/15/15 223 lb 6.4 oz (101.334 kg)     Lab Results  Component Value Date   WBC 5.2 11/06/2015   HGB 15.3 11/06/2015   HCT 45.1 11/06/2015   PLT 185.0 11/06/2015   GLUCOSE 142* 11/06/2015   CHOL 197 11/06/2015   TRIG 158.0* 11/06/2015   HDL 36.90* 11/06/2015   LDLDIRECT 117.0 07/03/2015   LDLCALC 128* 11/06/2015   ALT 23 11/06/2015   AST 16 11/06/2015   NA 141 11/06/2015   K 4.2 11/06/2015   CL 105 11/06/2015   CREATININE 0.81 11/06/2015   BUN 16 11/06/2015   CO2 29 11/06/2015   TSH 0.97 10/25/2012   PSA 1.04 11/06/2015   HGBA1C 6.1 11/06/2015   MICROALBUR 0.7 02/15/2015    Dg Knee 1-2 Views Right  08/25/2014  CLINICAL DATA:  Pain EXAM: RIGHT KNEE - 1-2 VIEW COMPARISON:  None. FINDINGS: Frontal and lateral views were obtained. There is no fracture, dislocation, or effusion. There is slight narrowing medially. There is a minimal spur along the anterior superior patella. No erosive change. IMPRESSION: Slight osteoarthritic change.  No fracture or effusion. Electronically Signed   By: Lowella Grip M.D.   On: 08/25/2014 16:53     Assessment & Plan:  Plan I have discontinued Ronald Petersen's piroxicam, Liraglutide, Insulin Pen Needle, lovastatin, fenofibrate, and metFORMIN. I have also changed his omeprazole. Additionally, I am having him maintain his sertraline,  oxyCODONE-acetaminophen, and gabapentin.  Meds ordered this encounter  Medications  . omeprazole (PRILOSEC) 20 MG capsule    Sig: 1 po bid    Dispense:  180 capsule    Refill:  3    Problem List Items Addressed This Visit    Hyperlipidemia LDL goal <70   Relevant Orders   Comp Met (CMET) (Completed)   CBC with Differential/Platelet (Completed)   Lipid panel (Completed)   POCT urinalysis dipstick (Completed)   PSA (Completed)   Hemoglobin A1c (Completed)   H. pylori antibody, IgG (Completed)   DM (diabetes mellitus) type II controlled, neurological manifestation (HCC)    Check labs Pt is off meds      Depression    con't zoloft  stable       Other Visit Diagnoses    Loss of weight    -  Primary    Relevant Orders    Ambulatory referral to Gastroenterology    Comp Met (CMET) (Completed)    CBC with Differential/Platelet (Completed)    Lipid panel (Completed)    POCT urinalysis dipstick (  Completed)    PSA (Completed)    Hemoglobin A1c (Completed)    H. pylori antibody, IgG (Completed)    B12 (Completed)    Nausea without vomiting        Relevant Orders    Ambulatory referral to Gastroenterology    Comp Met (CMET) (Completed)    CBC with Differential/Platelet (Completed)    Lipid panel (Completed)    POCT urinalysis dipstick (Completed)    PSA (Completed)    Hemoglobin A1c (Completed)    H. 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 pylori antibody, IgG (Completed)    Essential hypertension        Relevant Orders    Comp Met (CMET) (Completed)    CBC with Differential/Platelet (Completed)    Lipid panel (Completed)    POCT urinalysis dipstick  (Completed)    PSA (Completed)    Hemoglobin A1c (Completed)    H. pylori antibody, IgG (Completed)    Myalgia        Relevant Orders    ANA    Rheumatoid factor    Sedimentation rate (Completed)    Other fatigue        Relevant Orders    B12 (Completed)    Spinal stenosis in cervical region        Relevant Orders    MR Cervical Spine Wo Contrast    Gastroesophageal reflux disease, esophagitis presence not specified        Relevant Medications    omeprazole (PRILOSEC) 20 MG capsule    Need for hepatitis C screening test        Relevant Orders    Hepatitis C antibody    Screening for HIV (human immunodeficiency virus)        Relevant Orders    HIV antibody       Follow-up: Return in about 3 months (around 02/06/2016), or if symptoms worsen or fail to improve, for hypertension, hyperlipidemia, diabetes II.  Garnet Koyanagi, DO

## 2015-11-06 NOTE — Assessment & Plan Note (Signed)
Check labs Pt is off meds

## 2015-11-06 NOTE — Assessment & Plan Note (Signed)
con't zoloft  stable 

## 2015-11-07 LAB — HEPATITIS C ANTIBODY: HCV AB: NEGATIVE

## 2015-11-07 LAB — HIV ANTIBODY (ROUTINE TESTING W REFLEX): HIV 1&2 Ab, 4th Generation: NONREACTIVE

## 2015-11-07 LAB — ANA: ANA: NEGATIVE

## 2015-11-10 ENCOUNTER — Ambulatory Visit (HOSPITAL_BASED_OUTPATIENT_CLINIC_OR_DEPARTMENT_OTHER)
Admission: RE | Admit: 2015-11-10 | Discharge: 2015-11-10 | Disposition: A | Discharge status: Home / Self Care | Payer: 59 | Source: Ambulatory Visit | Attending: Family Medicine | Admitting: Family Medicine

## 2015-11-10 DIAGNOSIS — R531 Weakness: Secondary | ICD-10-CM | POA: Insufficient documentation

## 2015-11-10 DIAGNOSIS — M4802 Spinal stenosis, cervical region: Secondary | ICD-10-CM

## 2015-11-10 DIAGNOSIS — M47892 Other spondylosis, cervical region: Secondary | ICD-10-CM | POA: Diagnosis not present

## 2015-11-10 DIAGNOSIS — M542 Cervicalgia: Secondary | ICD-10-CM | POA: Diagnosis not present

## 2015-11-12 MED ORDER — ATORVASTATIN CALCIUM 20 MG PO TABS: 20.0000 mg | ORAL_TABLET | Freq: Every day | ORAL | Status: DC

## 2015-11-12 NOTE — Addendum Note (Signed)
Addended by: Tasia Catchings on: 11/12/2015 11:05 AM   Modules accepted: Orders

## 2015-11-29 ENCOUNTER — Telehealth: Payer: Self-pay | Admitting: Internal Medicine

## 2015-11-29 ENCOUNTER — Encounter: Payer: Self-pay | Admitting: Gastroenterology

## 2015-11-29 ENCOUNTER — Telehealth: Payer: Self-pay | Admitting: Family Medicine

## 2015-11-29 ENCOUNTER — Other Ambulatory Visit: Payer: Self-pay | Admitting: Family Medicine

## 2015-11-29 DIAGNOSIS — M4802 Spinal stenosis, cervical region: Secondary | ICD-10-CM

## 2015-11-29 NOTE — Telephone Encounter (Signed)
Sch'd Pt w/Jessica Zehr for 12-13-15 @ 3:00

## 2015-11-29 NOTE — Telephone Encounter (Signed)
CRelation to [id number] Call back number: [phone]   Reason for call:  Patient inquiring about MRI results

## 2015-11-29 NOTE — Telephone Encounter (Signed)
Pt can be scheduled with next available APP appt.

## 2015-11-29 NOTE — Telephone Encounter (Signed)
Rx sent to the pharmacy by e-script.//AB/CMA 

## 2015-12-03 NOTE — Telephone Encounter (Signed)
Per Dr. Etter Sjogren: Significant spinal stenosis-- refer to neurosurgery

## 2015-12-03 NOTE — Telephone Encounter (Signed)
Spoke with pt and he gave ok for referral. Placed this today.

## 2015-12-03 NOTE — Telephone Encounter (Signed)
Pt is returning your call   CB: [phone]

## 2015-12-03 NOTE — Telephone Encounter (Signed)
Called pt and LMOVM to return call.  °

## 2015-12-04 ENCOUNTER — Telehealth: Payer: Self-pay

## 2015-12-04 MED ORDER — PIROXICAM 10 MG PO CAPS: 10.0000 mg | ORAL_CAPSULE | Freq: Every day | ORAL | Status: DC

## 2015-12-04 NOTE — Telephone Encounter (Signed)
rx sent

## 2015-12-04 NOTE — Telephone Encounter (Signed)
Fax received on 11/29/15  From: [facility]  "[facility] 06/28/15 needs a refill for Piroxicam. Please note we have never filled this for him so we would need need a new prescription for it.  Thank you."   Called patient for reason for refill.  Pt states he his neuropathy in bilateral feet and legs.    Please advise.

## 2015-12-05 NOTE — Telephone Encounter (Signed)
Noted  

## 2015-12-10 ENCOUNTER — Telehealth: Payer: Self-pay | Admitting: Family Medicine

## 2015-12-10 DIAGNOSIS — H9313 Tinnitus, bilateral: Secondary | ICD-10-CM

## 2015-12-10 NOTE — Telephone Encounter (Signed)
Wife called stating Dr. Joya Salm sending report and is wanting pt to see a "brain doctor". She said that he told pt he does need back/neck surgery but there are other issue he believes need to be addressed first. Pt has vision issues and ringing in his ears as well as other symptoms that require a different provider. Please advise once report is received. Please call cell # [phone].

## 2015-12-10 NOTE — Telephone Encounter (Signed)
Ok to put referral in but I have not seen report

## 2015-12-10 NOTE — Telephone Encounter (Signed)
To MD.    KP 

## 2015-12-13 ENCOUNTER — Ambulatory Visit (INDEPENDENT_AMBULATORY_CARE_PROVIDER_SITE_OTHER): Payer: 59 | Admitting: Gastroenterology

## 2015-12-13 ENCOUNTER — Encounter: Payer: Self-pay | Admitting: Gastroenterology

## 2015-12-13 VITALS — BP 140/80 | HR 76 | Ht 72.0 in | Wt 218.0 lb

## 2015-12-13 DIAGNOSIS — R634 Abnormal weight loss: Secondary | ICD-10-CM

## 2015-12-13 DIAGNOSIS — R110 Nausea: Secondary | ICD-10-CM | POA: Diagnosis not present

## 2015-12-13 DIAGNOSIS — Z791 Long term (current) use of non-steroidal anti-inflammatories (NSAID): Secondary | ICD-10-CM | POA: Insufficient documentation

## 2015-12-13 DIAGNOSIS — Z86010 Personal history of colonic polyps: Secondary | ICD-10-CM

## 2015-12-13 MED ORDER — NA SULFATE-K SULFATE-MG SULF 17.5-3.13-1.6 GM/177ML PO SOLN: 1.0000 | ORAL | Status: AC

## 2015-12-13 MED ORDER — OMEPRAZOLE 40 MG PO CPDR: 40.0000 mg | DELAYED_RELEASE_CAPSULE | Freq: Every day | ORAL | Status: DC

## 2015-12-13 NOTE — Progress Notes (Signed)
12/13/2015 Ronald Petersen [id number] 22-Apr-1956   HISTORY OF PRESENT ILLNESS:  This is a 59 year old male who is previously known to Dr. Velora Heckler in April 2007 for colonoscopy. He had some polyps removed at that time that were adenomatous polyps and it was recommended that he have a repeat procedure in 5 years, but he has not yet returned for that.  He presents to the office today with complaints of persistent daily nausea for the past several months. This is unaffected by eating and is present whether he eats or not. He does complain of a lot of indigestion, etc. as well and has lost approximately 22 pounds since June. He says that he is having a lot of ringing in his years and other issues that are being evaluated as well and is scheduled for an MRI of his head in the near future. He admits to me that he is taking anywhere from 5-15 or 20 ibuprofen daily for the past 32 years for arthritic type pains. Omeprazole 20 mg daily is on his medicine list, but he says that he does not take it regularly.   Past Medical History  Diagnosis Date  . Hyperlipidemia   . Depression   . Diabetes mellitus   . Hypertension   . Arthritis    Past Surgical History  Procedure Laterality Date  . Appendectomy    . Cholecystectomy    . Lumbar laminectomy      reports that he has quit smoking. His smoking use included Cigarettes. He has never used smokeless tobacco. He reports that he drinks about 2.4 - 3.0 oz of alcohol per week. He reports that he does not use illicit drugs. family history includes COPD in his mother; Heart disease in his sister and sister; Ovarian cancer in his sister; Stomach cancer in his maternal grandmother and sister; Stroke in his father. Allergies  Allergen Reactions  . Niacin     REACTION: rash      Outpatient Encounter Prescriptions as of 12/13/2015  Medication Sig  . gabapentin (NEURONTIN) 100 MG capsule Take 2 capsules (200 mg total) by mouth 3 (three) times daily.    Marland Kitchen omeprazole (PRILOSEC) 20 MG capsule 1 po bid  . oxyCODONE-acetaminophen (ROXICET) 5-325 MG tablet Take 1 tablet by mouth every 8 (eight) hours as needed for severe pain.  . piroxicam (FELDENE) 10 MG capsule Take 1 capsule (10 mg total) by mouth daily.  . sertraline (ZOLOFT) 100 MG tablet TAKE 1&1/2 TABLETS BY MOUTH EVERYDAY *PLEASE SCHEDULE YOUR PHYSICAL*  . Na Sulfate-K Sulfate-Mg Sulf SOLN Take 1 kit by mouth as directed.  Marland Kitchen omeprazole (PRILOSEC) 40 MG capsule Take 1 capsule (40 mg total) by mouth daily.  . [DISCONTINUED] atorvastatin (LIPITOR) 20 MG tablet Take 1 tablet (20 mg total) by mouth daily.   No facility-administered encounter medications on file as of 12/13/2015.     REVIEW OF SYSTEMS  : All other systems reviewed and negative except where noted in the History of Present Illness.   PHYSICAL EXAM: BP 140/80 mmHg  Pulse 76  Ht 6' (1.829 m)  Wt 218 lb (98.884 kg)  BMI 29.56 kg/m2 General: Well developed white male in no acute distress Head: Normocephalic and atraumatic Eyes:  Sclerae anicteric, conjunctiva pink. Ears: Normal auditory acuity Lungs: Clear throughout to auscultation Heart: Regular rate and rhythm Abdomen: Soft, non-distended.  Normal bowel sounds.  Mild diffuse TTP. Rectal:  Will be done at the time of colonoscopy. Musculoskeletal: Symmetrical with no gross  deformities  Skin: No lesions on visible extremities Extremities: No edema  Neurological: Alert oriented x 4, grossly non-focal Psychological:  Alert and cooperative. Normal mood and affect  ASSESSMENT AND PLAN: -59 year old male with history of adenomatous colon polyps overdue for colonoscopy by 4 years. Now with complaints of persistent daily nausea and weight loss in the setting of significant NSAID use. We do need to evaluate endoscopically for gastric ulcers, etc. I have counseled him to discontinue the NSAID use but he thinks it will be impossible due to do so. I am going to place him on  omeprazole 40 mg daily. He also needs repeat colonoscopy as well. It is possible that his nausea is related to some other issues that are also being evaluated with an MRI of the head, however, as stated above also needs GI evaluation.  Will schedule with Dr. Havery Moros.  The risks, benefits, and alternatives to EGD and colonoscopy were discussed with the patient and he consents to proceed.   CC:  Rosalita Chessman, DO

## 2015-12-13 NOTE — Patient Instructions (Signed)
You have been scheduled for an endoscopy and colonoscopy. Please follow the written instructions given to you at your visit today. Please pick up your prep supplies at the pharmacy within the next 1-3 days. If you use inhalers (even only as needed), please bring them with you on the day of your procedure. Your physician has requested that you go to www.startemmi.com and enter the access code given to you at your visit today. This web site gives a general overview about your procedure. However, you should still follow specific instructions given to you by our office regarding your preparation for the procedure.  We have sent the following medications to your pharmacy for you to pick up at your convenience:

## 2015-12-14 NOTE — Progress Notes (Signed)
Agree with assessment and plan. He should stop all NSAIDs if possible given his symptoms and agree with PPI. We will await endoscopic evaluation.

## 2015-12-25 ENCOUNTER — Encounter: Payer: Self-pay | Admitting: Neurology

## 2015-12-25 ENCOUNTER — Ambulatory Visit (INDEPENDENT_AMBULATORY_CARE_PROVIDER_SITE_OTHER): Payer: 59 | Admitting: Neurology

## 2015-12-25 VITALS — BP 142/100 | HR 64 | Ht 72.0 in | Wt 220.0 lb

## 2015-12-25 DIAGNOSIS — H538 Other visual disturbances: Secondary | ICD-10-CM | POA: Diagnosis not present

## 2015-12-25 DIAGNOSIS — R4789 Other speech disturbances: Secondary | ICD-10-CM

## 2015-12-25 DIAGNOSIS — I10 Essential (primary) hypertension: Secondary | ICD-10-CM

## 2015-12-25 DIAGNOSIS — R4189 Other symptoms and signs involving cognitive functions and awareness: Secondary | ICD-10-CM | POA: Diagnosis not present

## 2015-12-25 DIAGNOSIS — R404 Transient alteration of awareness: Secondary | ICD-10-CM

## 2015-12-25 DIAGNOSIS — E1142 Type 2 diabetes mellitus with diabetic polyneuropathy: Secondary | ICD-10-CM

## 2015-12-25 NOTE — Patient Instructions (Addendum)
Will check MRI of the brain - This will be done at [address] [facility] ([facility] [phone]) on 12/30/15 @ 1:30 Will also schedule neuropsychological testing (for after MRI) Will check routine EEG Follow up after testing.

## 2015-12-25 NOTE — Progress Notes (Signed)
NEUROLOGY CONSULTATION NOTE  GUENTHER DUNSHEE MRN: [id number] DOB: [birth year]  Referring provider: Dr. Etter Sjogren Primary care provider: Dr. Etter Sjogren  Reason for consult:  Blurred vision, cognitive difficulties.  HISTORY OF PRESENT ILLNESS: Ronald Petersen is a 59 year old right-handed male with hyperlipidemia, depression, hypertension, diabetes with neuropathy and cervical stenosis who presents for cognitive problems, blurred vision, and word-finding issues.  History obtained by patient, his wife and PCP and GI notes.  Labs and images of brain MRA & MRI from 2009 reviewed.  For the past 2 years, he reports increased cognitive problems.  Specifically, he seems to have increased word-finding issues.  He also tends to misplace things and lose train of thought.  It sometimes affects his work.  On some occasions, he feels he gets a little disoriented while driving.  Over the past 6 months, he has had transient episodes.  He reports a pounding sound in his ears.  Sometimes, it is accompanied by a headache.  He then develops visual disturbance, usually blurred vision or yellowing of vision.  He becomes more disoriented during this.  He has trouble carrying on a conversation or he is slow to respond.  He is unsure the duration.  It usually occurs once a day.  On one occasion, it lasted 2 days.  CBC with diff normal, CMP unremarkable except for blood glucose of 142, Hgb A1c 6.1, ANA negative, RF negative, Sed Rate 13, B12 317, HIV nonreactive, HCV antibody negative  He did have an MRI and MRA of the head in 2009 for stroke evaluation.  He exhibited slurred speech.  It was unremarkable. He has a GED.  His father had a ruptured cerebral aneurysm at age 60.  PAST MEDICAL HISTORY: Past Medical History  Diagnosis Date  . Hyperlipidemia   . Depression   . Diabetes mellitus   . Hypertension   . Arthritis     PAST SURGICAL HISTORY: Past Surgical History  Procedure Laterality Date  . Appendectomy    .  Cholecystectomy    . Lumbar laminectomy      MEDICATIONS: Current Outpatient Prescriptions on File Prior to Visit  Medication Sig Dispense Refill  . gabapentin (NEURONTIN) 100 MG capsule Take 2 capsules (200 mg total) by mouth 3 (three) times daily. 180 capsule 5  . Ibuprofen 200 MG CAPS Take by mouth. Patient takes 5-20 tablets daily.    . Na Sulfate-K Sulfate-Mg Sulf SOLN Take 1 kit by mouth as directed. 354 mL 0  . omeprazole (PRILOSEC) 40 MG capsule Take 1 capsule (40 mg total) by mouth daily. 30 capsule 5  . oxyCODONE-acetaminophen (ROXICET) 5-325 MG tablet Take 1 tablet by mouth every 8 (eight) hours as needed for severe pain. 60 tablet 0  . piroxicam (FELDENE) 10 MG capsule Take 1 capsule (10 mg total) by mouth daily. 30 capsule 0  . sertraline (ZOLOFT) 100 MG tablet TAKE 1&1/2 TABLETS BY MOUTH EVERYDAY *PLEASE SCHEDULE YOUR PHYSICAL* 45 tablet 0   No current facility-administered medications on file prior to visit.    ALLERGIES: Allergies  Allergen Reactions  . Niacin     REACTION: rash    FAMILY HISTORY: Family History  Problem Relation Age of Onset  . Alcohol abuse    . Depression    . Arthritis    . Hypertension    . Ovarian cancer Sister   . Stomach cancer Sister   . Heart disease Sister     MI  . Stomach cancer Maternal Grandmother   .  COPD Mother   . Stroke Father   . Coronary artery disease    . Heart disease Sister     MI  . Ovarian cancer      neice  . Ovarian cancer      neice  . Colon cancer      SOCIAL HISTORY: Social History   Social History  . Marital Status: Married    Spouse Name: Lorriane Shire  . Number of Children: 2  . Years of Education: N/A   Occupational History  . self employed    Social History Main Topics  . Smoking status: Former Smoker    Types: Cigarettes  . Smokeless tobacco: Never Used  . Alcohol Use: 2.4 - 3.0 oz/week    4-5 Cans of beer per week     Comment: daily  . Drug Use: No  . Sexual Activity:    Partners:  Female   Other Topics Concern  . Not on file   Social History Narrative   Exercise-- 3 days   Pt has hs degree    REVIEW OF SYSTEMS: Constitutional: No fevers, chills, or sweats, no generalized fatigue, change in appetite Eyes: No visual changes, double vision, eye pain Ear, nose and throat: No hearing loss, ear pain, nasal congestion, sore throat Cardiovascular: No chest pain, palpitations Respiratory:  No shortness of breath at rest or with exertion, wheezes GastrointestinaI: No nausea, vomiting, diarrhea, abdominal pain, fecal incontinence Genitourinary:  No dysuria, urinary retention or frequency Musculoskeletal:  No neck pain, back pain Integumentary: No rash, pruritus, skin lesions Neurological: as above Psychiatric: No depression, insomnia, anxiety Endocrine: No palpitations, fatigue, diaphoresis, mood swings, change in appetite, change in weight, increased thirst Hematologic/Lymphatic:  No anemia, purpura, petechiae. Allergic/Immunologic: no itchy/runny eyes, nasal congestion, recent allergic reactions, rashes  PHYSICAL EXAM: Filed Vitals:   12/25/15 1307  BP: 142/100  Pulse: 64   General: No acute distress.  Patient appears well-groomed.  Head:  Normocephalic/atraumatic Eyes:  fundi unremarkable, without vessel changes, exudates, hemorrhages or papilledema. Neck: supple, no paraspinal tenderness, full range of motion Back: No paraspinal tenderness Heart: regular rate and rhythm Lungs: Clear to auscultation bilaterally. Vascular: No carotid bruits. Neurological Exam: Mental status: alert and oriented to person, place, and time, recalled 1 of 3 words after 5 minutes, remote memory intact, fund of knowledge intact, attention and concentration intact, speech fluent and not dysarthric, language intact. MMSE - Mini Mental State Exam 12/25/2015  Orientation to time 5  Orientation to Place 5  Registration 3  Attention/ Calculation 4  Recall 1  Language- name 2 objects  2  Language- repeat 1  Language- follow 3 step command 3  Language- read & follow direction 1  Write a sentence 1  Copy design 1  Total score 27   Cranial nerves: CN I: not tested CN II: pupils equal, round and reactive to light, visual Zeisler intact, fundi unremarkable, without vessel changes, exudates, hemorrhages or papilledema. CN III, IV, VI:  full range of motion, no nystagmus, no ptosis CN V: facial sensation intact CN VII: upper and lower face symmetric CN VIII: hearing intact CN IX, X: gag intact, uvula midline CN XI: sternocleidomastoid and trapezius muscles intact CN XII: tongue midline Bulk & Tone: normal, no fasciculations. Motor:  5/5 throughout  Sensation:  Mildly decreased pinprick sensation in toes.  Decreased vibration sensation in feet. Deep Tendon Reflexes:  2+ throughout, toes downgoing. Finger to nose testing:  Without dysmetria.  Heel to shin:  Without dysmetria.  Gait:  Wide based gait.  Stumbles a little bit.  Able to turn.  Some difficulty with tandem walking.  Romberg negative.  IMPRESSION: Cognitive problems such as word-finding difficulties Recurrent episodes of transient altered awareness presenting with visual disturbance, confusion and sometimes headache.  Given the recurrent habitual symptoms, TIA is less likely.  Also consider atypical migraine or seizures. 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  Will get MRI of brain 2.  Will proceed with neuropsychological testing. 3.  Will check routine EEG.  If unremarkable, consider 24 hour ambulatory EEG. 4.  Follow up after testing. 5.  BP elevated today.  Follow it up with PCP  Thank you for allowing me to take part in the care of this patient.  Metta Clines, DO  CC:  Garnet Koyanagi, DO

## 2015-12-29 ENCOUNTER — Ambulatory Visit
Admission: RE | Admit: 2015-12-29 | Discharge: 2015-12-29 | Disposition: A | Discharge status: Home / Self Care | Payer: 59 | Source: Ambulatory Visit | Attending: Neurology | Admitting: Neurology

## 2015-12-29 DIAGNOSIS — R4789 Other speech disturbances: Secondary | ICD-10-CM

## 2015-12-29 DIAGNOSIS — R4189 Other symptoms and signs involving cognitive functions and awareness: Secondary | ICD-10-CM

## 2015-12-29 DIAGNOSIS — H538 Other visual disturbances: Secondary | ICD-10-CM

## 2015-12-30 ENCOUNTER — Other Ambulatory Visit: Payer: 59

## 2015-12-31 ENCOUNTER — Other Ambulatory Visit: Payer: Self-pay | Admitting: Family

## 2015-12-31 ENCOUNTER — Other Ambulatory Visit: Payer: Self-pay | Admitting: Gastroenterology

## 2015-12-31 ENCOUNTER — Other Ambulatory Visit: Payer: Self-pay | Admitting: Family Medicine

## 2015-12-31 DIAGNOSIS — G8929 Other chronic pain: Secondary | ICD-10-CM

## 2016-01-01 ENCOUNTER — Telehealth: Payer: Self-pay

## 2016-01-01 MED ORDER — SERTRALINE HCL 100 MG PO TABS: 150.0000 mg | ORAL_TABLET | Freq: Every day | ORAL | Status: DC

## 2016-01-01 MED ORDER — OXYCODONE-ACETAMINOPHEN 5-325 MG PO TABS: 1.0000 | ORAL_TABLET | Freq: Three times a day (TID) | ORAL | Status: DC | PRN

## 2016-01-01 MED ORDER — PIROXICAM 10 MG PO CAPS: 10.0000 mg | ORAL_CAPSULE | Freq: Every day | ORAL | Status: DC

## 2016-01-01 NOTE — Telephone Encounter (Signed)
Message relayed to patient. Verbalized understanding and denied questions.   

## 2016-01-01 NOTE — Telephone Encounter (Signed)
Left message on machine for pt to return call to the office.  

## 2016-01-01 NOTE — Telephone Encounter (Signed)
-----   Message from Pieter Partridge, DO sent at 01/01/2016  7:18 AM EST ----- MRI of brain reveals no stroke or tumor.  Findings are not much different than prior MRI from 2009.

## 2016-01-01 NOTE — Telephone Encounter (Signed)
Last seen 11/06/15 and filled 10/08/15 #60 UDS 11/06/15 low risk  Please advise      KP

## 2016-01-02 MED FILL — SERTRALINE HCL 100 MG TAB: 100 | 30 days supply | Qty: 45 | Fill #0

## 2016-01-02 MED FILL — ATORVASTATIN 20 MG TABLET: 20 | 30 days supply | Qty: 30 | Fill #1

## 2016-01-02 MED FILL — PIROXICAM 10 MG CAPSULE: 10 | 30 days supply | Qty: 30 | Fill #0

## 2016-01-03 ENCOUNTER — Telehealth: Payer: Self-pay

## 2016-01-03 ENCOUNTER — Ambulatory Visit (INDEPENDENT_AMBULATORY_CARE_PROVIDER_SITE_OTHER): Payer: Self-pay | Admitting: Neurology

## 2016-01-03 ENCOUNTER — Other Ambulatory Visit: Payer: Self-pay | Admitting: Neurology

## 2016-01-03 DIAGNOSIS — R404 Transient alteration of awareness: Secondary | ICD-10-CM

## 2016-01-03 DIAGNOSIS — R4189 Other symptoms and signs involving cognitive functions and awareness: Secondary | ICD-10-CM

## 2016-01-03 DIAGNOSIS — F05 Delirium due to known physiological condition: Secondary | ICD-10-CM

## 2016-01-03 MED FILL — OXYCODONE/APAP 5-325: 5-325 | 20 days supply | Qty: 60 | Fill #0

## 2016-01-03 NOTE — Procedures (Signed)
ELECTROENCEPHALOGRAM REPORT  Date of Study: 01/02/2015  Patient's Name: Ronald Petersen MRN: [id number] Date of Birth: 02/12/1956  Indication: Episodes of disorientation and word-finding problems  Medications: Gabapentin Sertraline Piroxicam  Technical Summary: This is a multichannel digital EEG recording, using the international 10-20 placement system with electrodes applied with paste and impedances below 5000 ohms.    Description: The EEG background is symmetric, with a well-developed posterior dominant rhythm of 10 Hz, which is reactive to eye opening and closing.  Diffuse beta activity is seen, with a bilateral frontal preponderance.  No focal or generalized abnormalities are seen.  No focal or generalized epileptiform discharges are seen.  Stage II sleep is not seen.  Hyperventilation and photic stimulation were performed, and produced no abnormalities.  ECG revealed normal cardiac rate and rhythm.  Impression: This is a normal routine EEG of the awake and drowsy states, with activating procedures.  A normal study does not rule out the possibility of a seizure disorder in this patient.  Hartford Maulden R. Tomi Likens, DO

## 2016-01-03 NOTE — Telephone Encounter (Signed)
Message relayed to patient. Verbalized understanding and denied questions. EEG order placed in Susan's box.

## 2016-01-03 NOTE — Telephone Encounter (Signed)
-----   Message from Pieter Partridge, DO sent at 01/03/2016  2:28 PM EST ----- eeg normal.  I would like to order a 24 hour ambulatory eeg in order to try and capture one of his episodes of confusion

## 2016-01-04 ENCOUNTER — Telehealth: Payer: Self-pay

## 2016-01-04 NOTE — Telephone Encounter (Signed)
Patient scheduled at [facility]. 2/20 @ 1 pm. -Clinical Interview, 2/28 @1  - Testing Session, 3/1 @ 1 - Feedback/Results

## 2016-01-08 MED FILL — OMEPRAZOLE DR 40 MG CAPSULE: 40 | 30 days supply | Qty: 30 | Fill #1

## 2016-01-08 MED FILL — GABAPENTIN 100 MG CAPSULE: 100 | 30 days supply | Qty: 180 | Fill #2

## 2016-01-22 ENCOUNTER — Telehealth: Payer: Self-pay | Admitting: Gastroenterology

## 2016-01-22 ENCOUNTER — Encounter: Payer: Self-pay | Admitting: *Deleted

## 2016-01-22 NOTE — Telephone Encounter (Signed)
Pt. Stated that he can not afford suprep it is eighty dollars and he is not working,informed pt. That we are going to see if we have any preps here and we would be in contact with him.

## 2016-01-22 NOTE — Telephone Encounter (Signed)
Adela Lank, it is possible we will have some samples before 5:00pm today - hopefully he would be able to pick one up before 5 if we do, in fact, receive them.  Otherwise he will need to be switched to a different prep.  I think I have a Moviprep sample, but I'd rather wait to see if the other comes so he doesn't have to be completely re instructed.

## 2016-01-22 NOTE — Progress Notes (Signed)
Patient came by the office to pick up his prep around 4:30pm.  He has moviprep, and I explained how to mix the prep and when to take it.  We also reviewed his medications. He is a diabetic, but he hasn't been taking his medication due to weight loss.  He stated that he "doesn't need it anymore since he lost weight."  I told him not to take his metformin tonight, and to see how his blood sugar is tonight and tomorrow morning. He's to call us if he has any problems.  He was told that we would rather have it a little high than below 80. He will take his regular medications as directed.  We also reviewed clear liquids for tonight and tomorrow up until 12.  He was told nothing by mouth after 12n.  Good feedback given by patient. He knows to take his prep when he gets home tonight, and at 10:00am tomorrow morning. Pre-procedure sheet signed and prep instructions were circled.

## 2016-01-22 NOTE — Telephone Encounter (Signed)
Call placed to pt. To inform him that I have a free Movi Prep here and that he can come and pick the prep up along with a copy of instructions.pt. stated 'that he will come to pick up prep".

## 2016-01-22 NOTE — Telephone Encounter (Signed)
Attempted to call pt. And went to voicemail,message left.

## 2016-01-23 ENCOUNTER — Encounter: Payer: Self-pay | Admitting: Gastroenterology

## 2016-01-23 ENCOUNTER — Ambulatory Visit (AMBULATORY_SURGERY_CENTER): Payer: BLUE CROSS/BLUE SHIELD | Admitting: Gastroenterology

## 2016-01-23 ENCOUNTER — Other Ambulatory Visit: Payer: Self-pay

## 2016-01-23 ENCOUNTER — Encounter: Payer: Self-pay | Admitting: Neurology

## 2016-01-23 ENCOUNTER — Ambulatory Visit (INDEPENDENT_AMBULATORY_CARE_PROVIDER_SITE_OTHER): Payer: BLUE CROSS/BLUE SHIELD | Admitting: Neurology

## 2016-01-23 VITALS — BP 130/76 | HR 59 | Temp 98.1°F | Resp 13 | Ht 72.0 in | Wt 218.0 lb

## 2016-01-23 VITALS — HR 74 | Ht 72.0 in | Wt 219.0 lb

## 2016-01-23 DIAGNOSIS — R110 Nausea: Secondary | ICD-10-CM

## 2016-01-23 DIAGNOSIS — R404 Transient alteration of awareness: Secondary | ICD-10-CM | POA: Diagnosis not present

## 2016-01-23 DIAGNOSIS — Z86010 Personal history of colonic polyps: Secondary | ICD-10-CM

## 2016-01-23 DIAGNOSIS — R4789 Other speech disturbances: Secondary | ICD-10-CM

## 2016-01-23 DIAGNOSIS — R4189 Other symptoms and signs involving cognitive functions and awareness: Secondary | ICD-10-CM | POA: Diagnosis not present

## 2016-01-23 DIAGNOSIS — G6289 Other specified polyneuropathies: Secondary | ICD-10-CM

## 2016-01-23 MED ORDER — DONEPEZIL HCL 5 MG PO TABS: 5.0000 mg | ORAL_TABLET | Freq: Every day | ORAL | Status: DC

## 2016-01-23 MED ORDER — SODIUM CHLORIDE 0.9 % IV SOLN: 500.0000 mL | INTRAVENOUS | Status: DC

## 2016-01-23 MED FILL — DONEPEZIL HCL 5 MG TABLET: 5 | 30 days supply | Qty: 30 | Fill #0

## 2016-01-23 NOTE — Op Note (Signed)
[employer]  [address] [place], [address]   COLONOSCOPY PROCEDURE REPORT  PATIENT: Ronald Petersen, Ronald Petersen  MR#: [id number] BIRTHDATE: Jan 09, 1956 , 60  yrs. old GENDER: male ENDOSCOPIST: Yetta Flock, MD REFERRED BY: Garnet Koyanagi MD PROCEDURE DATE:  01/23/2016 PROCEDURE:   Colonoscopy, surveillance First Screening Colonoscopy - Avg.  risk and is 50 yrs.  old or older - No.  Prior Negative Screening - Now for repeat screening. N/A  History of Adenoma - Now for follow-up colonoscopy & has been > or = to 3 yrs.  Yes hx of adenoma.  Has been 3 or more years since last colonoscopy.  Polyps removed today? No Recommend repeat exam, <10 yrs? No ASA CLASS:   Class III INDICATIONS:Surveillance due to prior colonic polyps and Colorectal Neoplasm Risk Assessment for this procedure is average risk. MEDICATIONS: Propofol 450 mg IV  DESCRIPTION OF PROCEDURE:   After the risks benefits and alternatives of the procedure were thoroughly explained, informed consent was obtained.  The digital rectal exam revealed no abnormalities of the rectum.   The LB [id number]  endoscope was introduced through the anus and advanced to the cecum, which was identified by both the appendix and ileocecal valve. No adverse events experienced.   The quality of the prep was adequate  The instrument was then slowly withdrawn as the colon was fully examined. Estimated blood loss is zero unless otherwise noted in this procedure report.    COLON FINDINGS: The colon was tortous leading to a prolonged cecal intubation.  There were no colon polyps appreciated otherwise.  The colonic mucosa was normal.  Retroflexed views revealed internal hemorrhoids and hypertrophied anal papillae. The time to cecum = 6.1 Withdrawal time = 19.2   The scope was withdrawn and the procedure completed. COMPLICATIONS: There were no immediate complications.  ENDOSCOPIC IMPRESSION: Tortous colon No polyps  otherwise, normal colon Internal hemorrhoids, hypertrophied anal papillae  RECOMMENDATIONS: Resume diet Resume medications Repeat colonoscopy in 10 years for screening purposes  eSigned:  Yetta Flock, MD 01/23/2016 3:56 PM   cc:  Garnet Koyanagi MD, the patient

## 2016-01-23 NOTE — Patient Instructions (Signed)

## 2016-01-23 NOTE — Op Note (Signed)
[place]  [address]. [place], [address]   ENDOSCOPY PROCEDURE REPORT  PATIENT: Ronald Petersen, Ronald Petersen  MR#: [id number] BIRTHDATE: 06-Feb-1956 , 48  yrs. old GENDER: male ENDOSCOPIST: Yetta Flock, MD REFERRED BY: PROCEDURE DATE:  01/23/2016 PROCEDURE:  EGD w/ biopsy ASA CLASS:     Class III INDICATIONS:  chronic nausea. MEDICATIONS: Propofol 150 mg IV TOPICAL ANESTHETIC:  DESCRIPTION OF PROCEDURE: After the risks benefits and alternatives of the procedure were thoroughly explained, informed consent was obtained.  The LB [id number] endoscope was introduced through the mouth and advanced to the second portion of the duodenum , Without limitations.  The instrument was slowly withdrawn as the mucosa was fully examined.    FINDINGS: The esophagus was normal in appearance.  DH noted at 42cm from the incisors with GEJ and SCJ located 40cm from the incisors, with a 2cm hiatal hernia.  The stomach was remarkable for diffuse mild erythema without focal ulceration or erosion.  Biopsies were taken of the antrum and body to rule out H pylori.  The duodenal bulb and 2nd portion of the duodenum were otherwise normal. Retroflexed views revealed no abnormalities.     The scope was then withdrawn from the patient and the procedure completed.  COMPLICATIONS: There were no immediate complications.  ENDOSCOPIC IMPRESSION: Normal esophagus Mild gastritis, suspected related to NSAID use, biopsies obtained to rule out H pylori Normal duodenum  RECOMMENDATIONS: Await pathology results Minimize NSAID use Continue PPI Resume diet / medications otherwise  eSigned:  Yetta Flock, MD 01/23/2016 4:00 PM    CC: the patient

## 2016-01-23 NOTE — Progress Notes (Signed)
NEUROLOGY FOLLOW UP OFFICE NOTE  SHERRIL MAZAR FO:[id number]  HISTORY OF PRESENT ILLNESS: Ronald Petersen is a 60 year old right-handed male with hyperlipidemia, depression, hypertension, diabetes with neuropathy and cervical stenosis who follows up for cognitive deficits.   UPDATE: MRI of the brain from 12/29/15 showed moderate cerebral and cerebellar atrophy.  EEG from 01/03/16 was normal.  He has an upcoming neuropsychological evaluation.  He also reports that his arms ache, presumably from his neck.  He does have stenosis.  He also has aching pain down the back of his legs.  He reports increased numbness in the feet as well.  HISTORY: Since around October 2016, he reports increased cognitive problems.  Specifically, he seems to have increased word-finding issues.  He also tends to misplace things and lose train of thought.  It sometimes affects his work.  On some occasions, he feels he gets a little disoriented while driving.  Over the past several months, he has had transient episodes.  He reports a pounding sound in his ears.  Sometimes, it is accompanied by a headache.  He then develops visual disturbance, usually blurred vision or yellowing of vision.  He becomes more disoriented during this.  He has trouble carrying on a conversation or he is slow to respond.  He is unsure the duration.  It usually occurs once a day.  On one occasion, it lasted 2 days.  CBC with diff normal, CMP unremarkable except for blood glucose of 142, Hgb A1c 6.1, ANA negative, RF negative, Sed Rate 13, B12 317, HIV nonreactive, HCV antibody negative  He did have an MRI and MRA of the head in 2009 for stroke evaluation.  He exhibited slurred speech.  It was unremarkable. He has a GED.  His father had a ruptured cerebral aneurysm at age 92.  PAST MEDICAL HISTORY: Past Medical History  Diagnosis Date  . Hyperlipidemia   . Depression   . Diabetes mellitus   . Hypertension   . Arthritis      MEDICATIONS: Current Outpatient Prescriptions on File Prior to Visit  Medication Sig Dispense Refill  . gabapentin (NEURONTIN) 100 MG capsule Take 2 capsules (200 mg total) by mouth 3 (three) times daily. 180 capsule 5  . Ibuprofen 200 MG CAPS Take by mouth. Patient takes 5-20 tablets daily.    Marland Kitchen omeprazole (PRILOSEC) 40 MG capsule Take 1 capsule (40 mg total) by mouth daily. 30 capsule 5  . oxyCODONE-acetaminophen (ROXICET) 5-325 MG tablet Take 1 tablet by mouth every 8 (eight) hours as needed for severe pain. 60 tablet 0  . piroxicam (FELDENE) 10 MG capsule Take 1 capsule (10 mg total) by mouth daily. 30 capsule 0  . sertraline (ZOLOFT) 100 MG tablet Take 1.5 tablets (150 mg total) by mouth daily. 45 tablet 5   No current facility-administered medications on file prior to visit.    ALLERGIES: Allergies  Allergen Reactions  . Niacin     REACTION: rash    FAMILY HISTORY: Family History  Problem Relation Age of Onset  . Alcohol abuse    . Depression    . Arthritis    . Hypertension    . Ovarian cancer Sister   . Stomach cancer Sister   . Heart disease Sister     MI  . Stomach cancer Maternal Grandmother   . COPD Mother   . Stroke Father   . Coronary artery disease    . Heart disease Sister     MI  .  Ovarian cancer      neice  . Ovarian cancer      neice  . Colon cancer      SOCIAL HISTORY: Social History   Social History  . Marital Status: Married    Spouse Name: Lorriane Shire  . Number of Children: 2  . Years of Education: N/A   Occupational History  . self employed    Social History Main Topics  . Smoking status: Former Smoker    Types: Cigarettes  . Smokeless tobacco: Never Used  . Alcohol Use: 2.4 - 3.0 oz/week    4-5 Cans of beer per week     Comment: daily  . Drug Use: No  . Sexual Activity:    Partners: Female   Other Topics Concern  . Not on file   Social History Narrative   Exercise-- 3 days   Pt has hs degree    REVIEW OF  SYSTEMS: Constitutional: No fevers, chills, or sweats, no generalized fatigue, change in appetite Eyes: No visual changes, double vision, eye pain Ear, nose and throat: No hearing loss, ear pain, nasal congestion, sore throat Cardiovascular: No chest pain, palpitations Respiratory:  No shortness of breath at rest or with exertion, wheezes GastrointestinaI: No nausea, vomiting, diarrhea, abdominal pain, fecal incontinence Genitourinary:  No dysuria, urinary retention or frequency Musculoskeletal:  Neck pain Integumentary: No rash, pruritus, skin lesions Neurological: as above Psychiatric: No depression, insomnia, anxiety Endocrine: No palpitations, fatigue, diaphoresis, mood swings, change in appetite, change in weight, increased thirst Hematologic/Lymphatic:  No anemia, purpura, petechiae. Allergic/Immunologic: no itchy/runny eyes, nasal congestion, recent allergic reactions, rashes  PHYSICAL EXAM: Filed Vitals:   01/23/16 1307  Pulse: 74   General: No acute distress.  Patient appears well-groomed.   Head:  Normocephalic/atraumatic Eyes:  Fundoscopic exam unremarkable without vessel changes, exudates, hemorrhages or papilledema. Neck: supple, paraspinal tenderness, full range of motion Heart:  Regular rate and rhythm Lungs:  Clear to auscultation bilaterally Back: No paraspinal tenderness Neurological Exam: alert and oriented to person, place, and time, recalled 1 of 3 words after 5 minutes, remote memory intact, fund of knowledge intact, attention and concentration intact, speech fluent and not dysarthric, language intact. CN II-XII intact. Bulk and tone normal, muscle strength 5/5 throughout.  Decreased pinprick sensation in first 3 digits of left hand and all digits of right hand, as well as in feet.  Decreased vibration sensation in feet.  Deep tendon reflexes 1+ throughout.  Finger to nose and heel to shin testing intact.  Wide-based gait.  IMPRESSION: Cognitive problems such as  word-finding difficulties Recurrent episodes of transient altered awareness presenting with visual disturbance, confusion and sometimes headache.  Suspect possible migraine (seizure less likely).  Given the recurrent habitual symptoms, TIA is also less likely.   Worsening neuropathy.  Not likely diabetic since his diabetes is controlled.  May be related to cervical and lumbar disc disease.  Labs overall have been unrevealing.  PLAN: 1.  Will start Aricept 5mg  at bedtime for 4 weeks and increase to 10mg  at bedtime if tolerating. 2.  Check SPEP/IFE, CK 3.  24 hour ambulatory EEG to try and capture spell 4.  Neuropsych testing 5.  Follow up afterwards  Ronald Clines, DO  CC:  Garnet Koyanagi, DO

## 2016-01-23 NOTE — Progress Notes (Signed)
Called to room to assist during endoscopic procedure.  Patient ID and intended procedure confirmed with present staff. Received instructions for my participation in the procedure from the performing physician.  

## 2016-01-23 NOTE — Patient Instructions (Signed)
1.  For memory, will start donepezil (Aricept) 5mg  daily for four weeks.  If you are tolerating the medication, then after four weeks, we will increase the dose to 10mg  daily.  Side effects include nausea, vomiting, diarrhea, vivid dreams, and muscle cramps.  Please call the clinic if you experience any of these symptoms. 2.  Will check SPEP/IFE for neuropathy and CK for muscle pain 3.  24 hour ambulatory EEG 4.  Follow up at [place] 5.  Follow up after rest of testing completed.

## 2016-01-24 ENCOUNTER — Other Ambulatory Visit (INDEPENDENT_AMBULATORY_CARE_PROVIDER_SITE_OTHER): Payer: BLUE CROSS/BLUE SHIELD

## 2016-01-24 ENCOUNTER — Telehealth: Payer: Self-pay | Admitting: *Deleted

## 2016-01-24 DIAGNOSIS — G6289 Other specified polyneuropathies: Secondary | ICD-10-CM

## 2016-01-24 LAB — CK: Total CK: 50 U/L (ref 7–232)

## 2016-01-24 NOTE — Telephone Encounter (Signed)
No answer, left message to call if questions or concerns. 

## 2016-01-25 ENCOUNTER — Telehealth: Payer: Self-pay

## 2016-01-25 NOTE — Telephone Encounter (Signed)
Left message on machine for pt to return call to the office.  

## 2016-01-25 NOTE — Telephone Encounter (Signed)
Message relayed to patient. Verbalized understanding and denied questions.   

## 2016-01-25 NOTE — Telephone Encounter (Signed)
-----   Message from Pieter Partridge, DO sent at 01/24/2016  9:11 PM EST ----- Regarding: FW: Blood test shows no sign of muscle damage ----- Message -----    From: [facility]    Sent: 01/24/2016   4:10 PM      To: Pieter Partridge, DO

## 2016-01-28 MED FILL — ATORVASTATIN 20 MG TABLET: 20 | 30 days supply | Qty: 30 | Fill #2

## 2016-01-29 LAB — PROTEIN ELECTROPHORESIS, SERUM
ALBUMIN ELP: 4.4 g/dL (ref 3.8–4.8)
Alpha-1-Globulin: 0.2 g/dL (ref 0.2–0.3)
Alpha-2-Globulin: 0.7 g/dL (ref 0.5–0.9)
Beta 2: 0.3 g/dL (ref 0.2–0.5)
Beta Globulin: 0.4 g/dL (ref 0.4–0.6)
Gamma Globulin: 1 g/dL (ref 0.8–1.7)
Total Protein, Serum Electrophoresis: 7 g/dL (ref 6.1–8.1)

## 2016-01-29 LAB — IMMUNOFIXATION ELECTROPHORESIS
IgA: 141 mg/dL (ref 68–379)
IgG (Immunoglobin G), Serum: 989 mg/dL (ref 650–1600)
IgM, Serum: 100 mg/dL (ref 41–251)

## 2016-01-30 ENCOUNTER — Telehealth: Payer: Self-pay

## 2016-01-30 NOTE — Telephone Encounter (Signed)
-----   Message from Pieter Partridge, DO sent at 01/29/2016  7:23 PM EST ----- Labs are unremarkable

## 2016-02-05 MED FILL — OMEPRAZOLE DR 40 MG CAPSULE: 40 | 30 days supply | Qty: 30 | Fill #2

## 2016-02-05 MED FILL — GABAPENTIN 100 MG CAPSULE: 100 | 30 days supply | Qty: 180 | Fill #3

## 2016-02-05 MED FILL — PIROXICAM 10 MG CAPSULE: 10 | 30 days supply | Qty: 30 | Fill #0

## 2016-02-05 MED FILL — SERTRALINE HCL 100 MG TAB: 100 | 30 days supply | Qty: 45 | Fill #1

## 2016-02-06 ENCOUNTER — Ambulatory Visit (INDEPENDENT_AMBULATORY_CARE_PROVIDER_SITE_OTHER): Payer: BLUE CROSS/BLUE SHIELD | Admitting: Neurology

## 2016-02-06 DIAGNOSIS — R4189 Other symptoms and signs involving cognitive functions and awareness: Secondary | ICD-10-CM

## 2016-02-07 ENCOUNTER — Ambulatory Visit (INDEPENDENT_AMBULATORY_CARE_PROVIDER_SITE_OTHER): Payer: BLUE CROSS/BLUE SHIELD | Admitting: Family Medicine

## 2016-02-07 ENCOUNTER — Encounter: Payer: Self-pay | Admitting: Family Medicine

## 2016-02-07 VITALS — BP 130/84 | HR 76 | Temp 97.7°F | Ht 72.0 in | Wt 226.0 lb

## 2016-02-07 DIAGNOSIS — E785 Hyperlipidemia, unspecified: Secondary | ICD-10-CM

## 2016-02-07 NOTE — Progress Notes (Signed)
Pre visit review using our clinic review tool, if applicable. No additional management support is needed unless otherwise documented below in the visit note. 

## 2016-02-07 NOTE — Procedures (Signed)
24 HOUR ELECTROENCEPHALOGRAM REPORT  Date of Study: 02/06/2016 to 02/07/2016  Patient's Name: Ronald Petersen MRN: [id number] Date of Birth: Mar 30, 1956  Indication: recurrent transient altered awareness  Medications: Zoloft Gabapentin Roxicet piroxicam  Technical Summary: This is a multichannel digital EEG recording, using the international 10-20 placement system with electrodes applied with paste and impedances below 5000 ohms.    Description: The EEG background is symmetric, with a well-developed posterior dominant rhythm of 9 Hz, which is reactive to eye opening and closing.  Diffuse beta activity is seen, with a bilateral frontal preponderance.  No focal or generalized abnormalities are seen.  No focal or generalized epileptiform discharges are seen.  Stage II sleep is seen, with normal and symmetric sleep patterns.  Hyperventilation and photic stimulation were not performed.  ECG revealed normal cardiac rate and rhythm in the most readable segments.  Impression: This is a normal 24 hour EEG of the awake and asleep states, without activating procedures.    Adam R. Tomi Likens, DO

## 2016-02-08 ENCOUNTER — Telehealth: Payer: Self-pay

## 2016-02-08 NOTE — Telephone Encounter (Signed)
Results relayed to wife.

## 2016-02-08 NOTE — Telephone Encounter (Signed)
-----   Message from Pieter Partridge, DO sent at 02/07/2016  3:57 PM EST ----- eeg normal

## 2016-02-12 LAB — HM DIABETES EYE EXAM

## 2016-02-19 ENCOUNTER — Telehealth: Payer: Self-pay

## 2016-02-19 NOTE — Telephone Encounter (Signed)
Error.     KP 

## 2016-02-19 NOTE — Telephone Encounter (Deleted)
Rosalita Chessman, DO at 02/19/2016 5:15 PM     Status: Signed       Expand All Collapse All   Please put in mr Dung chart So its easier to send rx tamiflu 75 bid x 5 days             Ewing Schlein, CMA at 02/19/2016 2:13 PM     Status: Signed       Expand All Collapse All   Please advise KP            Shiquita C Johnson at 02/19/2016 1:10 PM     Status: Signed       Expand All Collapse All   Caller name: [place]   Relationship to patient: Spouse  Can be reached: [phone]  Pharmacy: [facility] [place]  Reason for call: He says that his wife (pt) has the flu and he would like to know if PCP could call her something in to the pharmacy. Informed that an appt is needed. He says that she doesn't feel well enough to come in.   Please advise.

## 2016-02-20 ENCOUNTER — Encounter: Payer: Self-pay | Admitting: Family Medicine

## 2016-02-22 ENCOUNTER — Other Ambulatory Visit: Payer: Self-pay | Admitting: Neurology

## 2016-02-22 MED FILL — DONEPEZIL HCL 10 MG TABLET: 10 | 30 days supply | Qty: 30 | Fill #0

## 2016-02-22 NOTE — Telephone Encounter (Signed)
Increase Aricept to 10mg  at bedtime.  This will be the standing dose, so we can provide refills.

## 2016-02-22 NOTE — Telephone Encounter (Signed)
Patient called for a refill.  States he was supposed to call back in 1 month. He is doing well on medication. No side effects. Unsure if it is helping. Wants to know if it should be increased or continue 5 mg daily. Please advise.   Pt prefers 90 day supply  [phone]

## 2016-02-26 ENCOUNTER — Ambulatory Visit (INDEPENDENT_AMBULATORY_CARE_PROVIDER_SITE_OTHER): Payer: BLUE CROSS/BLUE SHIELD | Admitting: Family Medicine

## 2016-02-26 ENCOUNTER — Telehealth: Payer: Self-pay | Admitting: Family Medicine

## 2016-02-26 ENCOUNTER — Encounter: Payer: Self-pay | Admitting: Family Medicine

## 2016-02-26 VITALS — BP 136/70 | HR 60 | Temp 97.8°F | Ht 72.0 in | Wt 222.6 lb

## 2016-02-26 DIAGNOSIS — G8929 Other chronic pain: Secondary | ICD-10-CM | POA: Diagnosis not present

## 2016-02-26 DIAGNOSIS — Z418 Encounter for other procedures for purposes other than remedying health state: Secondary | ICD-10-CM

## 2016-02-26 DIAGNOSIS — Z23 Encounter for immunization: Secondary | ICD-10-CM

## 2016-02-26 DIAGNOSIS — IMO0002 Reserved for concepts with insufficient information to code with codable children: Secondary | ICD-10-CM

## 2016-02-26 DIAGNOSIS — I10 Essential (primary) hypertension: Secondary | ICD-10-CM

## 2016-02-26 DIAGNOSIS — E785 Hyperlipidemia, unspecified: Secondary | ICD-10-CM | POA: Diagnosis not present

## 2016-02-26 DIAGNOSIS — Z293 Encounter for prophylactic fluoride administration: Secondary | ICD-10-CM

## 2016-02-26 DIAGNOSIS — E1165 Type 2 diabetes mellitus with hyperglycemia: Secondary | ICD-10-CM

## 2016-02-26 DIAGNOSIS — E1151 Type 2 diabetes mellitus with diabetic peripheral angiopathy without gangrene: Secondary | ICD-10-CM

## 2016-02-26 LAB — COMPREHENSIVE METABOLIC PANEL
ALT: 31 U/L (ref 0–53)
AST: 21 U/L (ref 0–37)
Albumin: 4.8 g/dL (ref 3.5–5.2)
Alkaline Phosphatase: 70 U/L (ref 39–117)
BUN: 13 mg/dL (ref 6–23)
CHLORIDE: 101 meq/L (ref 96–112)
CO2: 27 mEq/L (ref 19–32)
Calcium: 10 mg/dL (ref 8.4–10.5)
Creatinine, Ser: 0.8 mg/dL (ref 0.40–1.50)
GFR: 104.57 mL/min (ref >60.00)
GLUCOSE: 170 mg/dL — AB (ref 70–99)
POTASSIUM: 3.9 meq/L (ref 3.5–5.1)
SODIUM: 136 meq/L (ref 135–145)
Total Bilirubin: 0.8 mg/dL (ref 0.2–1.2)
Total Protein: 7.9 g/dL (ref 6.0–8.3)

## 2016-02-26 LAB — MICROALBUMIN / CREATININE URINE RATIO
Creatinine,U: 161.4 mg/dL
MICROALB/CREAT RATIO: 0.7 mg/g (ref 0.0–30.0)
Microalb, Ur: 1.2 mg/dL (ref 0.0–1.9)

## 2016-02-26 LAB — CBC WITH DIFFERENTIAL/PLATELET
BASOS PCT: 0.4 % (ref 0.0–3.0)
Basophils Absolute: 0 10*3/uL (ref 0.0–0.1)
EOS PCT: 2.4 % (ref 0.0–5.0)
Eosinophils Absolute: 0.2 10*3/uL (ref 0.0–0.7)
HCT: 45.6 % (ref 39.0–52.0)
Hemoglobin: 15.8 g/dL (ref 13.0–17.0)
LYMPHS ABS: 2.1 10*3/uL (ref 0.7–4.0)
Lymphocytes Relative: 31.6 % (ref 12.0–46.0)
MCHC: 34.7 g/dL (ref 30.0–36.0)
MCV: 82.9 fl (ref 78.0–100.0)
MONOS PCT: 9.7 % (ref 3.0–12.0)
Monocytes Absolute: 0.7 10*3/uL (ref 0.1–1.0)
NEUTROS ABS: 3.8 10*3/uL (ref 1.4–7.7)
NEUTROS PCT: 55.9 % (ref 43.0–77.0)
PLATELETS: 183 10*3/uL (ref 150.0–400.0)
RBC: 5.51 Mil/uL (ref 4.22–5.81)
RDW: 12.8 % (ref 11.5–15.5)
WBC: 6.7 10*3/uL (ref 4.0–10.5)

## 2016-02-26 LAB — POCT URINALYSIS DIPSTICK
Bilirubin, UA: NEGATIVE
Glucose, UA: NEGATIVE
KETONES UA: NEGATIVE
LEUKOCYTES UA: NEGATIVE
Nitrite, UA: NEGATIVE
PH UA: 6
PROTEIN UA: NEGATIVE
RBC UA: NEGATIVE
SPEC GRAV UA: 1.025
Urobilinogen, UA: 0.2

## 2016-02-26 LAB — LIPID PANEL
Cholesterol: 232 mg/dL — ABNORMAL HIGH (ref 0–200)
HDL: 36.2 mg/dL — AB (ref >39.00)
NONHDL: 195.66
Total CHOL/HDL Ratio: 6
Triglycerides: 245 mg/dL — ABNORMAL HIGH (ref 0.0–149.0)
VLDL: 49 mg/dL — ABNORMAL HIGH (ref 0.0–40.0)

## 2016-02-26 LAB — LDL CHOLESTEROL, DIRECT: LDL DIRECT: 152 mg/dL

## 2016-02-26 LAB — HEMOGLOBIN A1C: Hgb A1c MFr Bld: 6.9 % — ABNORMAL HIGH (ref 4.6–6.5)

## 2016-02-26 MED ORDER — BAYER CONTOUR MONITOR DEVI: Status: DC

## 2016-02-26 MED ORDER — BAYER MICROLET LANCETS MISC: Status: DC

## 2016-02-26 MED ORDER — BAYER MICROLET 2 LANCING DEVIC MISC: Status: DC

## 2016-02-26 MED ORDER — GLUCOSE BLOOD VI STRP: ORAL_STRIP | Status: DC

## 2016-02-26 MED ORDER — LISINOPRIL 5 MG PO TABS: 5.0000 mg | ORAL_TABLET | Freq: Every day | ORAL | Status: DC

## 2016-02-26 MED ORDER — BAYER CONTOUR TEST VI STRP: ORAL_STRIP | Status: DC

## 2016-02-26 MED ORDER — OXYCODONE-ACETAMINOPHEN 5-325 MG PO TABS: 1.0000 | ORAL_TABLET | Freq: Three times a day (TID) | ORAL | Status: DC | PRN

## 2016-02-26 MED FILL — CONTOUR NEXT METER: W/DEVICE | 30 days supply | Qty: 1 | Fill #0

## 2016-02-26 MED FILL — MICROLET LANCETS: 90 days supply | Qty: 100 | Fill #0

## 2016-02-26 MED FILL — CONTOUR NEXT STRIPS: 50 days supply | Qty: 50 | Fill #0

## 2016-02-26 MED FILL — LISINOPRIL 5 MG TABLET: 5 | 90 days supply | Qty: 90 | Fill #0

## 2016-02-26 MED FILL — OXYCODONE/APAP 5-325: 5-325 | 20 days supply | Qty: 60 | Fill #0

## 2016-02-26 NOTE — Telephone Encounter (Signed)
[facility] contour has been faxed.    KP

## 2016-02-26 NOTE — Assessment & Plan Note (Signed)
Check labs Pt has been off metformin ----home glucose readings have been high Will most likely start back on metformin

## 2016-02-26 NOTE — Progress Notes (Signed)
Patient ID: Ronald Petersen, male    DOB: 17-Jul-1956  Age: 60 y.o. MRN: [id number]    Subjective:  Subjective HPI Ronald Petersen presents for f/u dm, cholesterol and htn.  HPI HYPERTENSION  Blood pressure range-running high  Chest pain- no      Dyspnea- no Lightheadedness- no   Edema- no Other side effects - no   Medication compliance: good Low salt diet- yes  DIABETES  Blood Sugar ranges-running all over  Polyuria- no New Visual problems- no Hypoglycemic symptoms- no Other side effects-no Medication compliance - good Last eye exam- 02/12/2016 Foot exam- today  HYPERLIPIDEMIA  Medication compliance- na RUQ pain- no  Muscle aches- no Other side effects-no    Review of Systems  Constitutional: Negative for diaphoresis, appetite change, fatigue and unexpected weight change.  Eyes: Negative for pain, redness and visual disturbance.  Respiratory: Negative for cough, chest tightness, shortness of breath and wheezing.   Cardiovascular: Negative for chest pain, palpitations and leg swelling.  Endocrine: Negative for cold intolerance, heat intolerance, polydipsia, polyphagia and polyuria.  Genitourinary: Negative for dysuria, frequency and difficulty urinating.  Neurological: Negative for dizziness, light-headedness, numbness and headaches.    History Past Medical History  Diagnosis Date  . Hyperlipidemia   . Depression   . Diabetes mellitus   . Hypertension   . Arthritis   . Anxiety   . GERD (gastroesophageal reflux disease)   . Sleep apnea   . Neuromuscular disorder ([place])     NEUROPATHY    He has past surgical history that includes Appendectomy; Cholecystectomy; and Lumbar laminectomy.   His family history includes COPD in his mother; Heart disease in his sister and sister; Ovarian cancer in his sister; Stomach cancer in his maternal grandmother and sister; Stroke in his father.He reports that he has quit smoking. His smoking use included Cigarettes. He has  never used smokeless tobacco. He reports that he drinks about 2.4 - 3.0 oz of alcohol per week. He reports that he does not use illicit drugs.  Current Outpatient Prescriptions on File Prior to Visit  Medication Sig Dispense Refill  . donepezil (ARICEPT) 10 MG tablet Take 1 tablet (10 mg total) by mouth at bedtime. 30 tablet 3  . gabapentin (NEURONTIN) 100 MG capsule Take 2 capsules (200 mg total) by mouth 3 (three) times daily. 180 capsule 5  . Ibuprofen 200 MG CAPS Take by mouth. Patient takes 5-20 tablets daily.    Marland Kitchen omeprazole (PRILOSEC) 40 MG capsule Take 1 capsule (40 mg total) by mouth daily. 30 capsule 5  . piroxicam (FELDENE) 10 MG capsule Take 1 capsule (10 mg total) by mouth daily. 30 capsule 0  . sertraline (ZOLOFT) 100 MG tablet Take 1.5 tablets (150 mg total) by mouth daily. 45 tablet 5   No current facility-administered medications on file prior to visit.     Objective:  Objective Physical Exam  Constitutional: He is oriented to person, place, and time. Vital signs are normal. He appears well-developed and well-nourished. He is sleeping.  HENT:  Head: Normocephalic and atraumatic.  Mouth/Throat: Oropharynx is clear and moist.  Eyes: EOM are normal. Pupils are equal, round, and reactive to light.  Neck: Normal range of motion. Neck supple. No thyromegaly present.  Cardiovascular: Normal rate and regular rhythm.   No murmur heard. Pulmonary/Chest: Effort normal and breath sounds normal. No respiratory distress. He has no wheezes. He has no rales. He exhibits no tenderness.  Musculoskeletal: He exhibits no edema or tenderness.  Neurological: He is alert and oriented to person, place, and time.  Skin: Skin is warm and dry.  Psychiatric: He has a normal mood and affect. His behavior is normal. Judgment and thought content normal.  Nursing note and vitals reviewed. Sensory exam of the foot is normal, tested with the monofilament. Good pulses, no lesions or ulcers, good  peripheral pulses.  BP 136/70 mmHg  Pulse 60  Temp(Src) 97.8 F (36.6 C) (Oral)  Ht 6' (1.829 m)  Wt 222 lb 9.6 oz (100.971 kg)  BMI 30.18 kg/m2  SpO2 97% Wt Readings from Last 3 Encounters:  02/26/16 222 lb 9.6 oz (100.971 kg)  02/07/16 226 lb (102.513 kg)  01/23/16 218 lb (98.884 kg)     Lab Results  Component Value Date   WBC 5.2 11/06/2015   HGB 15.3 11/06/2015   HCT 45.1 11/06/2015   PLT 185.0 11/06/2015   GLUCOSE 142* 11/06/2015   CHOL 197 11/06/2015   TRIG 158.0* 11/06/2015   HDL 36.90* 11/06/2015   LDLDIRECT 117.0 07/03/2015   LDLCALC 128* 11/06/2015   ALT 23 11/06/2015   AST 16 11/06/2015   NA 141 11/06/2015   K 4.2 11/06/2015   CL 105 11/06/2015   CREATININE 0.81 11/06/2015   BUN 16 11/06/2015   CO2 29 11/06/2015   TSH 0.97 10/25/2012   PSA 1.04 11/06/2015   HGBA1C 6.1 11/06/2015   MICROALBUR 0.7 02/15/2015    Mr Brain Wo Contrast  12/29/2015  CLINICAL DATA:  Increasing cognitive problems, word-finding issues, disorientation, headache, and blurred vision. Symptom duration unspecified. EXAM: MRI HEAD WITHOUT CONTRAST TECHNIQUE: Multiplanar, multiecho pulse sequences of the brain and surrounding structures were obtained without intravenous contrast. COMPARISON:  MR brain 10/04/2008. FINDINGS: No evidence for acute infarction, hemorrhage, mass lesion, hydrocephalus, or extra-axial fluid. Moderate cerebral and cerebellar atrophy. No significant white matter disease. Slight prominence perivascular spaces could represent sequelae of hypertension, but no significant white matter disease is observed. Falx ossification. Flow voids are maintained throughout the carotid, basilar, and vertebral arteries. There are no areas of chronic hemorrhage. Pituitary, pineal, and cerebellar tonsils unremarkable. No upper cervical lesions. Visualized calvarium, skull base, and upper cervical osseous structures unremarkable. Scalp and extracranial soft tissues, orbits, sinuses, and  mastoids show no acute process. Compared with 2009, slight progression of cerebral volume loss. IMPRESSION: Moderate cerebral and cerebellar atrophy. Mild progression since 2009. No significant white matter disease. No acute intracranial findings. Electronically Signed   By: Staci Righter M.D.   On: 12/29/2015 10:30     Assessment & Plan:  Plan I am having Ronald Petersen start on lisinopril. I am also having him maintain his gabapentin, omeprazole, Ibuprofen, sertraline, piroxicam, donepezil, and oxyCODONE-acetaminophen.  Meds ordered this encounter  Medications  . oxyCODONE-acetaminophen (ROXICET) 5-325 MG tablet    Sig: Take 1 tablet by mouth every 8 (eight) hours as needed for severe pain.    Dispense:  60 tablet    Refill:  0  . DISCONTD: glucose blood test strip    Sig: Use as instructed    Dispense:  100 each    Refill:  12  . DISCONTD: glucose blood test strip    Sig: Use as instructed    Dispense:  100 each    Refill:  12    One touch verio flex  . lisinopril (PRINIVIL,ZESTRIL) 5 MG tablet    Sig: Take 1 tablet (5 mg total) by mouth daily.    Dispense:  90 tablet    Refill:  3    Problem List Items Addressed This Visit      Unprioritized   Hyperlipidemia LDL goal <70    Check labs Not on meds      Relevant Medications   lisinopril (PRINIVIL,ZESTRIL) 5 MG tablet   Other Relevant Orders   Comp Met (CMET)   CBC with Differential/Platelet   Hemoglobin A1c   Lipid panel   Microalbumin / creatinine urine ratio   POCT urinalysis dipstick (Completed)   DM (diabetes mellitus) type II uncontrolled, periph vascular disorder (HCC) - Primary    Check labs Pt has been off metformin ----home glucose readings have been high Will most likely start back on metformin      Relevant Medications   lisinopril (PRINIVIL,ZESTRIL) 5 MG tablet   Other Relevant Orders   Comp Met (CMET)   CBC with Differential/Platelet   Hemoglobin A1c   Lipid panel   Microalbumin / creatinine urine  ratio   POCT urinalysis dipstick (Completed)    Other Visit Diagnoses    Essential hypertension        Relevant Medications    lisinopril (PRINIVIL,ZESTRIL) 5 MG tablet    Other Relevant Orders    Comp Met (CMET)    CBC with Differential/Platelet    Hemoglobin A1c    Lipid panel    Microalbumin / creatinine urine ratio    POCT urinalysis dipstick (Completed)    Chronic pain        Relevant Medications    oxyCODONE-acetaminophen (ROXICET) 5-325 MG tablet    Need for prophylactic fluoride administration        Flu vaccine need        Relevant Orders    Flu Vaccine QUAD 36+ mos IM (Completed)       Follow-up: Return in about 3 months (around 05/25/2016), or if symptoms worsen or fail to improve, for htn, DM, htn, hyperlipidemia, DM.  Garnet Koyanagi, DO

## 2016-02-26 NOTE — Telephone Encounter (Signed)
Ronald Petersen from [place]  Pts insurance will not cover [employer] products. Please send new RX for [employer] products.

## 2016-02-26 NOTE — Progress Notes (Signed)
Pre visit review using our clinic review tool, if applicable. No additional management support is needed unless otherwise documented below in the visit note. 

## 2016-02-26 NOTE — Patient Instructions (Signed)

## 2016-02-26 NOTE — Assessment & Plan Note (Signed)
Check labs Not on meds

## 2016-02-29 MED FILL — GABAPENTIN 100 MG CAPSULE: 100 | 30 days supply | Qty: 180 | Fill #4

## 2016-02-29 MED FILL — SERTRALINE HCL 100 MG TAB: 100 | 30 days supply | Qty: 45 | Fill #2

## 2016-02-29 MED FILL — OMEPRAZOLE DR 40 MG CAPSULE: 40 | 30 days supply | Qty: 30 | Fill #3

## 2016-03-05 ENCOUNTER — Other Ambulatory Visit: Payer: Self-pay

## 2016-03-05 MED ORDER — METFORMIN HCL ER 500 MG PO TB24: 500.0000 mg | ORAL_TABLET | Freq: Every day | ORAL | Status: DC

## 2016-03-05 MED ORDER — SIMVASTATIN 20 MG PO TABS: 20.0000 mg | ORAL_TABLET | Freq: Every day | ORAL | Status: DC

## 2016-03-05 MED FILL — METFORMIN HCL ER 500 MG TAB: 500 | 30 days supply | Qty: 30 | Fill #0

## 2016-03-05 MED FILL — SIMVASTATIN 20 MG TABLET: 20 | 30 days supply | Qty: 30 | Fill #0

## 2016-03-17 ENCOUNTER — Telehealth: Payer: Self-pay | Admitting: *Deleted

## 2016-03-17 NOTE — Telephone Encounter (Signed)
Received request for medical records pertaining to: Neuropsychological Evaluation Report; forwarded to [place] for email/scan//SLS 03/20

## 2016-03-21 ENCOUNTER — Encounter: Payer: 59 | Admitting: Family Medicine

## 2016-03-31 ENCOUNTER — Telehealth: Payer: Self-pay | Admitting: Neurology

## 2016-03-31 MED FILL — GABAPENTIN 100 MG CAPSULE: 100 | 30 days supply | Qty: 180 | Fill #5

## 2016-03-31 MED FILL — METFORMIN HCL ER 500 MG TAB: 500 | 30 days supply | Qty: 30 | Fill #1

## 2016-03-31 MED FILL — OMEPRAZOLE DR 40 MG CAPSULE: 40 | 30 days supply | Qty: 30 | Fill #4

## 2016-03-31 MED FILL — SIMVASTATIN 20 MG TABLET: 20 | 30 days supply | Qty: 30 | Fill #1

## 2016-03-31 MED FILL — SERTRALINE HCL 100 MG TAB: 100 | 30 days supply | Qty: 45 | Fill #3

## 2016-03-31 NOTE — Telephone Encounter (Signed)
Attempted to reach pt. No answer. Just continuously rang. Will call back in an hour.

## 2016-03-31 NOTE — Telephone Encounter (Signed)
Attempted to reach. Vm picked up this attempt and VM was left.

## 2016-03-31 NOTE — Telephone Encounter (Signed)
[phone] pt has some questions please call

## 2016-04-01 ENCOUNTER — Ambulatory Visit (INDEPENDENT_AMBULATORY_CARE_PROVIDER_SITE_OTHER): Payer: BLUE CROSS/BLUE SHIELD | Admitting: Family Medicine

## 2016-04-01 ENCOUNTER — Encounter: Payer: Self-pay | Admitting: Family Medicine

## 2016-04-01 VITALS — BP 134/82 | HR 63 | Temp 98.0°F | Ht 72.0 in | Wt 218.2 lb

## 2016-04-01 DIAGNOSIS — G8929 Other chronic pain: Secondary | ICD-10-CM | POA: Diagnosis not present

## 2016-04-01 DIAGNOSIS — F329 Major depressive disorder, single episode, unspecified: Secondary | ICD-10-CM | POA: Diagnosis not present

## 2016-04-01 DIAGNOSIS — M2550 Pain in unspecified joint: Secondary | ICD-10-CM | POA: Diagnosis not present

## 2016-04-01 DIAGNOSIS — F32A Depression, unspecified: Secondary | ICD-10-CM

## 2016-04-01 MED ORDER — OXYCODONE-ACETAMINOPHEN 5-325 MG PO TABS: 1.0000 | ORAL_TABLET | Freq: Three times a day (TID) | ORAL | Status: DC | PRN

## 2016-04-01 MED ORDER — GABAPENTIN 300 MG PO CAPS: 300.0000 mg | ORAL_CAPSULE | Freq: Three times a day (TID) | ORAL | Status: DC

## 2016-04-01 MED ORDER — SERTRALINE HCL 100 MG PO TABS: ORAL_TABLET | ORAL | Status: DC

## 2016-04-01 MED FILL — OXYCODONE/APAP 5-325: 5-325 | 20 days supply | Qty: 60 | Fill #0

## 2016-04-01 MED FILL — GABAPENTIN 300 MG CAPSULE: 300 | 60 days supply | Qty: 180 | Fill #0

## 2016-04-01 NOTE — Progress Notes (Signed)
Pre visit review using our clinic review tool, if applicable. No additional management support is needed unless otherwise documented below in the visit note. 

## 2016-04-01 NOTE — Patient Instructions (Signed)

## 2016-04-01 NOTE — Progress Notes (Signed)
Patient ID: Ronald Petersen, male    DOB: August 24, 1956  Age: 60 y.o. MRN: [id number]    Subjective:  Subjective HPI Ronald Petersen presents for R shoulder pain--- no known inury.  Pain x several months.     Review of Systems  Constitutional: Negative for diaphoresis, appetite change, fatigue and unexpected weight change.  Eyes: Negative for pain, redness and visual disturbance.  Respiratory: Negative for cough, chest tightness, shortness of breath and wheezing.   Cardiovascular: Negative for chest pain, palpitations and leg swelling.  Endocrine: Negative for cold intolerance, heat intolerance, polydipsia, polyphagia and polyuria.  Genitourinary: Negative for dysuria, frequency and difficulty urinating.  Musculoskeletal: Positive for myalgias and arthralgias.  Neurological: Negative for dizziness, light-headedness, numbness and headaches.    History Past Medical History  Diagnosis Date  . Hyperlipidemia   . Depression   . Diabetes mellitus   . Hypertension   . Arthritis   . Anxiety   . GERD (gastroesophageal reflux disease)   . Sleep apnea   . Neuromuscular disorder ([place])     NEUROPATHY    He has past surgical history that includes Appendectomy; Cholecystectomy; and Lumbar laminectomy.   His family history includes COPD in his mother; Heart disease in his sister and sister; Ovarian cancer in his sister; Stomach cancer in his maternal grandmother and sister; Stroke in his father.He reports that he has quit smoking. His smoking use included Cigarettes. He has never used smokeless tobacco. He reports that he drinks about 2.4 - 3.0 oz of alcohol per week. He reports that he does not use illicit drugs.  Current Outpatient Prescriptions on File Prior to Visit  Medication Sig Dispense Refill  . BAYER CONTOUR TEST test strip Check Blood sugar once daily. Dx E11.9 50 each 12  . BAYER MICROLET LANCETS lancets Check Blood sugar once daily. Dx E11.9 50 each 11  . Blood Glucose Monitoring  Suppl (CONTOUR BLOOD GLUCOSE SYSTEM) DEVI Check Blood sugar once daily. Dx E11.9 1 Device 0  . Ibuprofen 200 MG CAPS Take by mouth. Patient takes 5-20 tablets daily.    [name] Devices (BAYER MICROLET 2 LANCING DEVIC) MISC Check Blood sugar once daily. Dx E11.9 50 each 11  . lisinopril (PRINIVIL,ZESTRIL) 5 MG tablet Take 1 tablet (5 mg total) by mouth daily. 90 tablet 3  . metFORMIN ([name]) 500 MG 24 hr tablet Take 1 tablet (500 mg total) by mouth daily with breakfast. 30 tablet 2  . omeprazole (PRILOSEC) 40 MG capsule Take 1 capsule (40 mg total) by mouth daily. 30 capsule 5  . piroxicam (FELDENE) 10 MG capsule Take 1 capsule (10 mg total) by mouth daily. 30 capsule 0  . simvastatin (ZOCOR) 20 MG tablet Take 1 tablet (20 mg total) by mouth at bedtime. 30 tablet 2   No current facility-administered medications on file prior to visit.     Objective:  Objective Physical Exam  Constitutional: He is oriented to person, place, and time. Vital signs are normal. He appears well-developed and well-nourished. He is sleeping.  HENT:  Head: Normocephalic and atraumatic.  Mouth/Throat: Oropharynx is clear and moist.  Eyes: EOM are normal. Pupils are equal, round, and reactive to light.  Neck: Normal range of motion. Neck supple. No thyromegaly present.  Cardiovascular: Normal rate and regular rhythm.   No murmur heard. Pulmonary/Chest: Effort normal and breath sounds normal. No respiratory distress. He has no wheezes. He has no rales. He exhibits no tenderness.  Musculoskeletal: He exhibits tenderness. He  exhibits no edema.       Right shoulder: He exhibits decreased range of motion, tenderness and pain. He exhibits no swelling, no effusion, no deformity, no laceration, no spasm, normal pulse and normal strength.  Neurological: He is alert and oriented to person, place, and time.  Skin: Skin is warm and dry.  Psychiatric: He has a normal mood and affect. His behavior is normal. Judgment and  thought content normal.  Nursing note and vitals reviewed.  BP 134/82 mmHg  Pulse 63  Temp(Src) 98 F (36.7 C) (Oral)  Ht 6' (1.829 m)  Wt 218 lb 3.2 oz (98.975 kg)  BMI 29.59 kg/m2  SpO2 98% Wt Readings from Last 3 Encounters:  04/02/16 219 lb (99.338 kg)  04/01/16 218 lb 3.2 oz (98.975 kg)  02/26/16 222 lb 9.6 oz (100.971 kg)     Lab Results  Component Value Date   WBC 6.7 02/26/2016   HGB 15.8 02/26/2016   HCT 45.6 02/26/2016   PLT 183.0 02/26/2016   GLUCOSE 170* 02/26/2016   CHOL 232* 02/26/2016   TRIG 245.0* 02/26/2016   HDL 36.20* 02/26/2016   LDLDIRECT 152.0 02/26/2016   LDLCALC 128* 11/06/2015   ALT 31 02/26/2016   AST 21 02/26/2016   NA 136 02/26/2016   K 3.9 02/26/2016   CL 101 02/26/2016   CREATININE 0.80 02/26/2016   BUN 13 02/26/2016   CO2 27 02/26/2016   TSH 0.97 10/25/2012   PSA 1.04 11/06/2015   HGBA1C 6.9* 02/26/2016   MICROALBUR 1.2 02/26/2016    Ronald Petersen  12/29/2015  CLINICAL DATA:  Increasing cognitive problems, word-finding issues, disorientation, headache, and blurred vision. Symptom duration unspecified. EXAM: MRI HEAD WITHOUT Petersen TECHNIQUE: Multiplanar, multiecho pulse sequences of the brain and surrounding structures were obtained without intravenous Petersen. COMPARISON:  Ronald brain 10/04/2008. FINDINGS: No evidence for acute infarction, hemorrhage, mass lesion, hydrocephalus, or extra-axial fluid. Moderate cerebral and cerebellar atrophy. No significant white matter disease. Slight prominence perivascular spaces could represent sequelae of hypertension, but no significant white matter disease is observed. Falx ossification. Flow voids are maintained throughout the carotid, basilar, and vertebral arteries. There are no areas of chronic hemorrhage. Pituitary, pineal, and cerebellar tonsils unremarkable. No upper cervical lesions. Visualized calvarium, skull base, and upper cervical osseous structures unremarkable. Scalp and  extracranial soft tissues, orbits, sinuses, and mastoids show no acute process. Compared with 2009, slight progression of cerebral volume loss. IMPRESSION: Moderate cerebral and cerebellar atrophy. Mild progression since 2009. No significant white matter disease. No acute intracranial findings. Electronically Signed   By: Staci Righter M.D.   On: 12/29/2015 10:30     Assessment & Plan:  Plan I have discontinued Ronald Petersen's gabapentin. I have also changed his sertraline. Additionally, I am having him start on gabapentin. Lastly, I am having him maintain his omeprazole, Ibuprofen, piroxicam, CONTOUR BLOOD GLUCOSE SYSTEM, BAYER CONTOUR TEST, BAYER MICROLET 2 LANCING DEVIC, BAYER MICROLET LANCETS, lisinopril, simvastatin, metFORMIN, and oxyCODONE-acetaminophen.  Meds ordered this encounter  Medications  . oxyCODONE-acetaminophen (ROXICET) 5-325 MG tablet    Sig: Take 1 tablet by mouth every 8 (eight) hours as needed for severe pain.    Dispense:  60 tablet    Refill:  0  . sertraline (ZOLOFT) 100 MG tablet    Sig: 2 po qd    Dispense:  60 tablet    Refill:  5  . gabapentin (NEURONTIN) 300 MG capsule    Sig: Take 1 capsule (300 mg total) by  mouth 3 (three) times daily.    Dispense:  180 capsule    Refill:  3    Problem List Items Addressed This Visit      Unprioritized   Depression   Relevant Medications   sertraline (ZOLOFT) 100 MG tablet   Joint pain   Relevant Medications   gabapentin (NEURONTIN) 300 MG capsule   Other Relevant Orders   Ambulatory referral to Rheumatology    Other Visit Diagnoses    Chronic pain    -  Primary    Relevant Medications    oxyCODONE-acetaminophen (ROXICET) 5-325 MG tablet    sertraline (ZOLOFT) 100 MG tablet    gabapentin (NEURONTIN) 300 MG capsule       Follow-up: Return in about 6 months (around 10/01/2016), or if symptoms worsen or fail to improve.  [name], DO

## 2016-04-01 NOTE — Telephone Encounter (Signed)
Patient has f/u tomorrow, will close encounter.

## 2016-04-02 ENCOUNTER — Encounter: Payer: Self-pay | Admitting: Neurology

## 2016-04-02 ENCOUNTER — Ambulatory Visit (INDEPENDENT_AMBULATORY_CARE_PROVIDER_SITE_OTHER): Payer: BLUE CROSS/BLUE SHIELD | Admitting: Neurology

## 2016-04-02 VITALS — BP 128/76 | HR 74 | Ht 72.0 in | Wt 219.0 lb

## 2016-04-02 DIAGNOSIS — R4189 Other symptoms and signs involving cognitive functions and awareness: Secondary | ICD-10-CM

## 2016-04-02 DIAGNOSIS — G4733 Obstructive sleep apnea (adult) (pediatric): Secondary | ICD-10-CM | POA: Diagnosis not present

## 2016-04-02 DIAGNOSIS — F419 Anxiety disorder, unspecified: Secondary | ICD-10-CM

## 2016-04-02 DIAGNOSIS — Z7289 Other problems related to lifestyle: Secondary | ICD-10-CM

## 2016-04-02 DIAGNOSIS — Z789 Other specified health status: Secondary | ICD-10-CM

## 2016-04-02 NOTE — Progress Notes (Signed)
Chart forwarded.  

## 2016-04-02 NOTE — Progress Notes (Signed)
NEUROLOGY FOLLOW UP OFFICE NOTE  [name] [id number]  HISTORY OF PRESENT ILLNESS: Ronald Petersen is a 60 year old right-handed male with hyperlipidemia, depression, hypertension, diabetes with neuropathy and cervical stenosis who follows up for cognitive deficits. He is accompanied by his wife, who provides some history.  UPDATE: Cognitive impairment: 24 hour ambulatory EEG was performed from 02/06/16 to 02/07/16 to evaluate for transient altered awareness, which was normal.  He underwent neuropsychological testing on 02/18/16.  Testing met criteria for unspecified mental disorder due to reported cognitive decline and demonstration of variable pattern of cognitive weakness in non-contextual verbal memory, phonemic fluency, aspect of verbal category switching fluency, and borderline impaired simple graphomotor sequencing.  Pattern of performances suggest multiple factors, such as chronic pain with long-term opioid use, untreated OSA, insomnia, hypertension, daily alcohol consumption, poor diet, emotional distress, and feeling of helplessness and apathy are likely contributors.  However, an underlying neurodegenerative disease could not be ruled out.  He also met criteria for major depressive disorder.  Numbness in feet and leg pain: Labs for neuropathy was checked:  Hgb A1c was 6.9.  CK was 50.  SPEP/IFE were normal. He continues to have generalized pain involving joints and muscle  He says he hasn't drank alcohol for 2 weeks.  HISTORY: Since around October 2016, he reports increased cognitive problems.  Specifically, he seems to have increased word-finding issues.  He also tends to misplace things and lose train of thought.  It sometimes affects his work.  On some occasions, he feels he gets a little disoriented while driving.  Over the past several months, he has had transient episodes.  He reports a pounding sound in his ears.  Sometimes, it is accompanied by a headache.  He then  develops visual disturbance, usually blurred vision or yellowing of vision.  He becomes more disoriented during this.  He has trouble carrying on a conversation or he is slow to respond.  He is unsure the duration.  It usually occurs once a day.  On one occasion, it lasted 2 days.  MRI of the brain from 12/29/15 showed moderate cerebral and cerebellar atrophy.  EEG from 01/03/16 was normal.    CBC with diff normal, CMP unremarkable except for blood glucose of 142, Hgb A1c 6.1, ANA negative, RF negative, Sed Rate 13, B12 317, HIV nonreactive, HCV antibody negative  He did have an MRI and MRA of the head in 2009 for stroke evaluation.  He exhibited slurred speech.  It was unremarkable. He has a GED.  His father had a ruptured cerebral aneurysm at age 74. He consumes alcohol daily (4-5 up to 8 beers daily). He has insomnia and untreated OSA.  He also reports that his arms ache, presumably from his neck.  He does have stenosis.  He also has aching pain down the back of his legs.  He reports increased numbness in the feet as well.  PAST MEDICAL HISTORY: Past Medical History  Diagnosis Date  . Hyperlipidemia   . Depression   . Diabetes mellitus   . Hypertension   . Arthritis   . Anxiety   . GERD (gastroesophageal reflux disease)   . Sleep apnea   . Neuromuscular disorder ([place])     NEUROPATHY    MEDICATIONS: Current Outpatient Prescriptions on File Prior to Visit  Medication Sig Dispense Refill  . BAYER CONTOUR TEST test strip Check Blood sugar once daily. Dx E11.9 50 each 12  . BAYER MICROLET LANCETS lancets Check Blood  sugar once daily. Dx E11.9 50 each 11  . Blood Glucose Monitoring Suppl (CONTOUR BLOOD GLUCOSE SYSTEM) DEVI Check Blood sugar once daily. Dx E11.9 1 Device 0  . gabapentin (NEURONTIN) 300 MG capsule Take 1 capsule (300 mg total) by mouth 3 (three) times daily. 180 capsule 3  . Ibuprofen 200 MG CAPS Take by mouth. Patient takes 5-20 tablets daily.    [name] Devices (BAYER  MICROLET 2 LANCING DEVIC) MISC Check Blood sugar once daily. Dx E11.9 50 each 11  . lisinopril (PRINIVIL,ZESTRIL) 5 MG tablet Take 1 tablet (5 mg total) by mouth daily. 90 tablet 3  . metFORMIN ([name]) 500 MG 24 hr tablet Take 1 tablet (500 mg total) by mouth daily with breakfast. 30 tablet 2  . omeprazole (PRILOSEC) 40 MG capsule Take 1 capsule (40 mg total) by mouth daily. 30 capsule 5  . oxyCODONE-acetaminophen (ROXICET) 5-325 MG tablet Take 1 tablet by mouth every 8 (eight) hours as needed for severe pain. 60 tablet 0  . piroxicam (FELDENE) 10 MG capsule Take 1 capsule (10 mg total) by mouth daily. 30 capsule 0  . sertraline (ZOLOFT) 100 MG tablet 2 po qd 60 tablet 5  . simvastatin (ZOCOR) 20 MG tablet Take 1 tablet (20 mg total) by mouth at bedtime. 30 tablet 2   No current facility-administered medications on file prior to visit.    ALLERGIES: Allergies  Allergen Reactions  . Niacin     REACTION: rash    FAMILY HISTORY: Family History  Problem Relation Age of Onset  . Alcohol abuse    . Depression    . Arthritis    . Hypertension    . Ovarian cancer Sister   . Stomach cancer Sister   . Heart disease Sister     MI  . Stomach cancer Maternal Grandmother   . COPD Mother   . Stroke Father   . Coronary artery disease    . Heart disease Sister     MI  . Ovarian cancer      neice  . Ovarian cancer      neice  . Colon cancer      SOCIAL HISTORY: Social History   Social History  . Marital Status: Married    Spouse Name: Lorriane Shire  . Number of Children: 2  . Years of Education: N/A   Occupational History  . self employed    Social History Main Topics  . Smoking status: Former Smoker    Types: Cigarettes  . Smokeless tobacco: Never Used  . Alcohol Use: 2.4 - 3.0 oz/week    4-5 Cans of beer per week     Comment: daily  . Drug Use: No  . Sexual Activity:    Partners: Female   Other Topics Concern  . Not on file   Social History Narrative    Exercise-- 3 days   Pt has hs degree    REVIEW OF SYSTEMS: Constitutional: No fevers, chills, or sweats, no generalized fatigue, change in appetite Eyes: No visual changes, double vision, eye pain Ear, nose and throat: No hearing loss, ear pain, nasal congestion, sore throat Cardiovascular: No chest pain, palpitations Respiratory:  No shortness of breath at rest or with exertion, wheezes GastrointestinaI: No nausea, vomiting, diarrhea, abdominal pain, fecal incontinence Genitourinary:  No dysuria, urinary retention or frequency Musculoskeletal:  No neck pain, back pain Integumentary: No rash, pruritus, skin lesions Neurological: as above Psychiatric: No depression, insomnia, anxiety Endocrine: No palpitations, fatigue, diaphoresis, mood  swings, change in appetite, change in weight, increased thirst Hematologic/Lymphatic:  No anemia, purpura, petechiae. Allergic/Immunologic: no itchy/runny eyes, nasal congestion, recent allergic reactions, rashes  PHYSICAL EXAM: Filed Vitals:   04/02/16 0750  BP: 128/76  Pulse: 74   General: No acute distress.  Patient appears well-groomed.  normal body habitus. Head:  Normocephalic/atraumatic  IMPRESSION: Word-finding difficulties/confusion, multifactorial, possibly related to history of alcoholism, untreated OSA, pain medication, and anxiety. Dizziness Excessive alcohol consumption Untreated OSA Anxiety  PLAN: 1.  As he doesn't meet criteria at this point for dementia/Alzheimer's, we will discontinue Aricept 2.  It is imperative that he seek treatment for OSA.  Poor sleep hygiene is correlated with risk for developing dementia.  We will refer him to sleep medicine. 3.  It is imperative that he follow a healthy diet.  Mediterranean diet has shown to reduce risk for developing dementia. 4.  Medication/lifestyle compliance of cerebrovascular risk factors, such as diabetes.  Routine exercise 5. Recommended psychotherapy to address anxiety and  alcohol abuse.  He is not agreeable at this time. 6.  Repeat neuropsychological testing in one year. 7.  Follow up after repeat testing (or sooner if needed)  27 minutes spent face to face with patient, 100% spent discussing results of testing, diagnosis and management.  Metta Clines, DO  CC:  Garnet Koyanagi, DO  Leeroy Cha, MD

## 2016-04-02 NOTE — Patient Instructions (Signed)
1.  Stop the Aricept 2.  We will refer you to Pulmonology to treat sleep apnea 3.  Consider counseling for drinking and anxiety 4.  Reschedule neuropsychological testing for next February with follow up with me afterwards 5.  Try to limit use of pain relievers and opiates if possible

## 2016-04-05 ENCOUNTER — Encounter: Payer: Self-pay | Admitting: Family Medicine

## 2016-04-05 DIAGNOSIS — M2550 Pain in unspecified joint: Secondary | ICD-10-CM | POA: Insufficient documentation

## 2016-04-07 ENCOUNTER — Ambulatory Visit (INDEPENDENT_AMBULATORY_CARE_PROVIDER_SITE_OTHER): Payer: BLUE CROSS/BLUE SHIELD | Admitting: Pulmonary Disease

## 2016-04-07 ENCOUNTER — Encounter: Payer: Self-pay | Admitting: Pulmonary Disease

## 2016-04-07 VITALS — BP 150/98 | HR 67 | Ht 72.0 in | Wt 216.0 lb

## 2016-04-07 DIAGNOSIS — G4733 Obstructive sleep apnea (adult) (pediatric): Secondary | ICD-10-CM | POA: Diagnosis not present

## 2016-04-07 NOTE — Patient Instructions (Signed)
Sleep study

## 2016-04-07 NOTE — Assessment & Plan Note (Addendum)
Given excessive daytime somnolence, narrow pharyngeal exam, witnessed apneas & loud snoring, obstructive sleep apnea is probable & an overnight polysomnogram will be scheduled as a split study.  It is possible that as well as weight loss, his prior OSA has resolved  The pathophysiology of obstructive sleep apnea , it's cardiovascular consequences & modes of treatment including CPAP were discused with the patient in detail & they evidenced understanding.

## 2016-04-07 NOTE — Progress Notes (Signed)
Subjective:    Patient ID: Ronald Petersen, male    DOB: 1956-01-27, 60 y.o.   MRN: [id number]  HPI  60 year old man presents for evaluation of sleep-disordered breathing. He is accompanied by his wife when the son who provides sleep partner history. He was diagnosed with OSA in 2004, he trial CPAP for short. If time but could not tolerate and abandon therapy. He is now being evaluated for cerebral and cerebellar atrophy. He has done poorly on neuro cognitive testing and comorbid sleep disturbance needs to be ruled out. He reports 50 pound weight loss since his original study. He reports excessive daytime fatigue but attributes it to his heart rate being low Epworth sleepiness score is 5 Loud snoring has been noted by his wife and she is occasionally witnessed apneas. Bedtime is between 11 PM and midnight, sleep latency is minimal, he sleeps on his side with 2 pillows reports 6-8 nocturnal awakenings either spontaneously or bathroom visits, he is also bothered by neck pain-and is out of bed by 8 AM with dryness of mouth. He reports chronic headaches all day long  There is no history suggestive of cataplexy, sleep paralysis or parasomnias He is self-employed and makes canopies for walkways   Significant tests/ events  MRI of the brain from 12/29/15 showed moderate cerebral and cerebellar atrophy  Past Medical History  Diagnosis Date  . Hyperlipidemia   . Depression   . Diabetes mellitus   . Hypertension   . Arthritis   . Anxiety   . GERD (gastroesophageal reflux disease)   . Sleep apnea   . Neuromuscular disorder ([place])     NEUROPATHY     Past Surgical History  Procedure Laterality Date  . Appendectomy    . Cholecystectomy    . Lumbar laminectomy      Allergies  Allergen Reactions  . Niacin     REACTION: rash    Social History   Social History  . Marital Status: Married    Spouse Name: Ronald Petersen  . Number of Children: 2  . Years of Education: N/A    Occupational History  . self employed    Social History Main Topics  . Smoking status: Former Smoker -- 16 years    Types: Cigarettes    Quit date: 12/29/1985  . Smokeless tobacco: Never Used  . Alcohol Use: 2.4 - 3.0 oz/week    4-5 Cans of beer per week     Comment: daily - 1-2 beers  . Drug Use: No  . Sexual Activity:    Partners: Female   Other Topics Concern  . Not on file   Social History Narrative   Exercise-- 3 days   Pt has hs degree     Family History  Problem Relation Age of Onset  . Alcohol abuse    . Depression    . Arthritis    . Hypertension    . Ovarian cancer Sister   . Stomach cancer Sister   . Heart disease Sister     MI  . Stomach cancer Maternal Grandmother   . COPD Mother   . Stroke Father   . Coronary artery disease    . Heart disease Sister     MI  . Ovarian cancer      neice  . Ovarian cancer      neice  . Colon cancer       Review of Systems  neg for any significant sore throat, dysphagia, itching, sneezing, nasal congestion  or excess/ purulent secretions, fever, chills, sweats, unintended wt loss, pleuritic or exertional cp, hempoptysis, orthopnea pnd or change in chronic leg swelling.  Also denies presyncope, palpitations, heartburn, abdominal pain, nausea, vomiting, diarrhea or change in bowel or urinary habits, dysuria,hematuria, rash, arthralgias, visual complaints, headache, numbness weakness or ataxia.     Objective:   Physical Exam  Gen. Pleasant, well-nourished, in no distress ENT - no lesions, no post nasal drip Neck: No JVD, no thyromegaly, no carotid bruits Lungs: no use of accessory muscles, no dullness to percussion, clear without rales or rhonchi  Cardiovascular: Rhythm regular, heart sounds  normal, no murmurs or gallops, no peripheral edema Musculoskeletal: No deformities, no cyanosis or clubbing        Assessment & Plan:

## 2016-04-08 ENCOUNTER — Other Ambulatory Visit: Payer: Self-pay

## 2016-04-08 ENCOUNTER — Other Ambulatory Visit: Payer: Self-pay | Admitting: Family Medicine

## 2016-04-08 MED ORDER — PIROXICAM 10 MG PO CAPS: 10.0000 mg | ORAL_CAPSULE | Freq: Every day | ORAL | Status: DC

## 2016-04-09 MED FILL — PIROXICAM 10 MG CAPSULE: 10 | 30 days supply | Qty: 30 | Fill #0

## 2016-05-02 MED FILL — SIMVASTATIN 20 MG TABLET: 20 | 30 days supply | Qty: 30 | Fill #2

## 2016-05-02 MED FILL — OMEPRAZOLE DR 40 MG CAPSULE: 40 | 30 days supply | Qty: 30 | Fill #5

## 2016-05-02 MED FILL — SERTRALINE HCL 100 MG TAB: 100 | 30 days supply | Qty: 45 | Fill #4

## 2016-05-02 MED FILL — METFORMIN HCL ER 500 MG TAB: 500 | 30 days supply | Qty: 30 | Fill #2

## 2016-06-03 ENCOUNTER — Encounter (HOSPITAL_BASED_OUTPATIENT_CLINIC_OR_DEPARTMENT_OTHER): Payer: BLUE CROSS/BLUE SHIELD

## 2016-06-04 ENCOUNTER — Other Ambulatory Visit: Payer: Self-pay | Admitting: Family Medicine

## 2016-06-04 ENCOUNTER — Other Ambulatory Visit: Payer: Self-pay | Admitting: Gastroenterology

## 2016-06-04 DIAGNOSIS — E7800 Pure hypercholesterolemia, unspecified: Secondary | ICD-10-CM

## 2016-06-04 DIAGNOSIS — E119 Type 2 diabetes mellitus without complications: Secondary | ICD-10-CM

## 2016-06-04 MED FILL — SIMVASTATIN 20 MG TABLET: 20 | 30 days supply | Qty: 30 | Fill #0

## 2016-06-04 MED FILL — SERTRALINE HCL 100 MG TAB: 100 | 30 days supply | Qty: 45 | Fill #5

## 2016-06-04 MED FILL — OMEPRAZOLE DR 40 MG CAPSULE: 40 | 30 days supply | Qty: 30 | Fill #0

## 2016-06-04 MED FILL — LISINOPRIL 5 MG TABLET: 5 | 90 days supply | Qty: 90 | Fill #1

## 2016-06-04 MED FILL — PIROXICAM 10 MG CAPSULE: 10 | 30 days supply | Qty: 30 | Fill #1

## 2016-06-04 MED FILL — METFORMIN HCL ER 500 MG TAB: 500 | 30 days supply | Qty: 30 | Fill #0

## 2016-06-10 ENCOUNTER — Encounter: Payer: BLUE CROSS/BLUE SHIELD | Admitting: Family Medicine

## 2016-06-12 ENCOUNTER — Encounter: Payer: Self-pay | Admitting: Family Medicine

## 2016-06-12 ENCOUNTER — Ambulatory Visit (INDEPENDENT_AMBULATORY_CARE_PROVIDER_SITE_OTHER): Payer: BLUE CROSS/BLUE SHIELD | Admitting: Family Medicine

## 2016-06-12 VITALS — BP 123/74 | HR 62 | Temp 97.4°F | Ht 72.0 in | Wt 218.4 lb

## 2016-06-12 DIAGNOSIS — E1165 Type 2 diabetes mellitus with hyperglycemia: Secondary | ICD-10-CM | POA: Diagnosis not present

## 2016-06-12 DIAGNOSIS — E1151 Type 2 diabetes mellitus with diabetic peripheral angiopathy without gangrene: Secondary | ICD-10-CM | POA: Diagnosis not present

## 2016-06-12 DIAGNOSIS — M4802 Spinal stenosis, cervical region: Secondary | ICD-10-CM | POA: Diagnosis not present

## 2016-06-12 DIAGNOSIS — Z0000 Encounter for general adult medical examination without abnormal findings: Secondary | ICD-10-CM | POA: Diagnosis not present

## 2016-06-12 DIAGNOSIS — I10 Essential (primary) hypertension: Secondary | ICD-10-CM

## 2016-06-12 DIAGNOSIS — K219 Gastro-esophageal reflux disease without esophagitis: Secondary | ICD-10-CM

## 2016-06-12 DIAGNOSIS — G8929 Other chronic pain: Secondary | ICD-10-CM | POA: Diagnosis not present

## 2016-06-12 DIAGNOSIS — E785 Hyperlipidemia, unspecified: Secondary | ICD-10-CM

## 2016-06-12 DIAGNOSIS — F329 Major depressive disorder, single episode, unspecified: Secondary | ICD-10-CM | POA: Diagnosis not present

## 2016-06-12 DIAGNOSIS — M2550 Pain in unspecified joint: Secondary | ICD-10-CM

## 2016-06-12 DIAGNOSIS — IMO0002 Reserved for concepts with insufficient information to code with codable children: Secondary | ICD-10-CM

## 2016-06-12 DIAGNOSIS — F32A Depression, unspecified: Secondary | ICD-10-CM

## 2016-06-12 LAB — COMPREHENSIVE METABOLIC PANEL
ALBUMIN: 4.7 g/dL (ref 3.5–5.2)
ALK PHOS: 91 U/L (ref 39–117)
ALT: 27 U/L (ref 0–53)
AST: 19 U/L (ref 0–37)
BUN: 19 mg/dL (ref 6–23)
CALCIUM: 9.7 mg/dL (ref 8.4–10.5)
CHLORIDE: 102 meq/L (ref 96–112)
CO2: 27 mEq/L (ref 19–32)
CREATININE: 0.72 mg/dL (ref 0.40–1.50)
GFR: 117.97 mL/min (ref >60.00)
Glucose, Bld: 152 mg/dL — ABNORMAL HIGH (ref 70–99)
POTASSIUM: 4.3 meq/L (ref 3.5–5.1)
Sodium: 135 mEq/L (ref 135–145)
Total Bilirubin: 0.7 mg/dL (ref 0.2–1.2)
Total Protein: 7.3 g/dL (ref 6.0–8.3)

## 2016-06-12 LAB — CBC WITH DIFFERENTIAL/PLATELET
Basophils Absolute: 0 K/uL (ref 0.0–0.1)
Basophils Relative: 0.5 % (ref 0.0–3.0)
Eosinophils Absolute: 0.1 K/uL (ref 0.0–0.7)
Eosinophils Relative: 2.2 % (ref 0.0–5.0)
HCT: 43.4 % (ref 39.0–52.0)
Hemoglobin: 14.7 g/dL (ref 13.0–17.0)
Lymphocytes Relative: 31.1 % (ref 12.0–46.0)
Lymphs Abs: 1.7 K/uL (ref 0.7–4.0)
MCHC: 33.8 g/dL (ref 30.0–36.0)
MCV: 83 fl (ref 78.0–100.0)
Monocytes Absolute: 0.4 K/uL (ref 0.1–1.0)
Monocytes Relative: 8.3 % (ref 3.0–12.0)
Neutro Abs: 3.1 K/uL (ref 1.4–7.7)
Neutrophils Relative %: 57.9 % (ref 43.0–77.0)
Platelets: 145 K/uL — ABNORMAL LOW (ref 150.0–400.0)
RBC: 5.23 Mil/uL (ref 4.22–5.81)
RDW: 13.9 % (ref 11.5–15.5)
WBC: 5.3 K/uL (ref 4.0–10.5)

## 2016-06-12 LAB — PSA: PSA: 1.14 ng/mL (ref 0.10–4.00)

## 2016-06-12 LAB — POCT URINALYSIS DIPSTICK
Bilirubin, UA: NEGATIVE
Blood, UA: NEGATIVE
Ketones, UA: NEGATIVE
LEUKOCYTES UA: NEGATIVE
Nitrite, UA: NEGATIVE
Protein, UA: NEGATIVE
SPEC GRAV UA: 1.02
UROBILINOGEN UA: NEGATIVE
pH, UA: 6

## 2016-06-12 LAB — LIPID PANEL
CHOLESTEROL: 212 mg/dL — AB (ref 0–200)
HDL: 43.1 mg/dL (ref >39.00)
LDL CALC: 139 mg/dL — AB (ref 0–99)
NonHDL: 168.92
TRIGLYCERIDES: 148 mg/dL (ref 0.0–149.0)
Total CHOL/HDL Ratio: 5
VLDL: 29.6 mg/dL (ref 0.0–40.0)

## 2016-06-12 LAB — HEMOGLOBIN A1C: Hgb A1c MFr Bld: 6.7 % — ABNORMAL HIGH (ref 4.6–6.5)

## 2016-06-12 MED ORDER — GABAPENTIN 300 MG PO CAPS: 300.0000 mg | ORAL_CAPSULE | Freq: Three times a day (TID) | ORAL | Status: DC

## 2016-06-12 MED ORDER — METFORMIN HCL ER 500 MG PO TB24: ORAL_TABLET | ORAL | Status: DC

## 2016-06-12 MED ORDER — SERTRALINE HCL 100 MG PO TABS: ORAL_TABLET | ORAL | Status: DC

## 2016-06-12 MED ORDER — OXYCODONE-ACETAMINOPHEN 5-325 MG PO TABS: 1.0000 | ORAL_TABLET | Freq: Three times a day (TID) | ORAL | Status: DC | PRN

## 2016-06-12 MED ORDER — LISINOPRIL 5 MG PO TABS: 5.0000 mg | ORAL_TABLET | Freq: Every day | ORAL | Status: DC

## 2016-06-12 MED ORDER — OMEPRAZOLE 40 MG PO CPDR: DELAYED_RELEASE_CAPSULE | ORAL | Status: DC

## 2016-06-12 MED ORDER — PIROXICAM 10 MG PO CAPS: 10.0000 mg | ORAL_CAPSULE | Freq: Every day | ORAL | Status: DC

## 2016-06-12 MED ORDER — SIMVASTATIN 20 MG PO TABS: ORAL_TABLET | ORAL | Status: DC

## 2016-06-12 MED FILL — OXYCODONE/APAP 5-325: 5-325 | 20 days supply | Qty: 60 | Fill #0

## 2016-06-12 MED FILL — GABAPENTIN 300 MG CAPSULE: 300 | 60 days supply | Qty: 180 | Fill #0

## 2016-06-12 NOTE — Progress Notes (Signed)
Pre visit review using our clinic review tool, if applicable. No additional management support is needed unless otherwise documented below in the visit note. 

## 2016-06-12 NOTE — Progress Notes (Signed)
Patient ID: Ronald Petersen, male    DOB: October 17, 1956  Age: 60 y.o. MRN: [id number]    Subjective:  Subjective HPI Ronald Petersen presents for cpe.  Still c/o neck pain -- it is worsening and is requesting a second opinion at [facility].  No other complaints.    Review of Systems  Constitutional: Negative.   HENT: Negative for congestion, ear pain, hearing loss, nosebleeds, postnasal drip, rhinorrhea, sinus pressure, sneezing and tinnitus.   Eyes: Negative for photophobia, discharge, itching and visual disturbance.  Respiratory: Negative.   Cardiovascular: Negative.   Gastrointestinal: Negative for abdominal pain, constipation, blood in stool, abdominal distention and anal bleeding.  Endocrine: Negative.   Genitourinary: Negative.   Musculoskeletal: Negative.   Skin: Negative.   Allergic/Immunologic: Negative.   Neurological: Negative for dizziness, weakness, light-headedness, numbness and headaches.  Psychiatric/Behavioral: Negative for suicidal ideas, confusion, sleep disturbance, dysphoric mood, decreased concentration and agitation. The patient is not nervous/anxious.     History Past Medical History  Diagnosis Date  . Hyperlipidemia   . Depression   . Diabetes mellitus   . Hypertension   . Arthritis   . Anxiety   . GERD (gastroesophageal reflux disease)   . Sleep apnea   . Neuromuscular disorder ([place])     NEUROPATHY    He has past surgical history that includes Appendectomy; Cholecystectomy; and Lumbar laminectomy.   His family history includes COPD in his mother; Heart disease in his sister and sister; Ovarian cancer in his sister; Stomach cancer in his maternal grandmother and sister; Stroke in his father.He reports that he quit smoking about 30 years ago. His smoking use included Cigarettes. He quit after 16 years of use. He has never used smokeless tobacco. He reports that he drinks about 2.4 - 3.0 oz of alcohol per week. He reports that he does not use illicit  drugs.  Current Outpatient Prescriptions on File Prior to Visit  Medication Sig Dispense Refill  . BAYER CONTOUR TEST test strip Check Blood sugar once daily. Dx E11.9 50 each 12  . BAYER MICROLET LANCETS lancets Check Blood sugar once daily. Dx E11.9 50 each 11  . Blood Glucose Monitoring Suppl (CONTOUR BLOOD GLUCOSE SYSTEM) DEVI Check Blood sugar once daily. Dx E11.9 1 Device 0  . Ibuprofen 200 MG CAPS Take by mouth. Patient takes 5-20 tablets daily.    [name] Devices (BAYER MICROLET 2 LANCING DEVIC) MISC Check Blood sugar once daily. Dx E11.9 50 each 11   No current facility-administered medications on file prior to visit.     Objective:  Objective Physical Exam  Constitutional: He is oriented to person, place, and time. He appears well-developed and well-nourished. No distress.  HENT:  Head: Normocephalic and atraumatic.  Right Ear: External ear normal.  Left Ear: External ear normal.  Nose: Nose normal.  Mouth/Throat: Oropharynx is clear and moist. No oropharyngeal exudate.  Eyes: Conjunctivae and EOM are normal. Pupils are equal, round, and reactive to light. Right eye exhibits no discharge. Left eye exhibits no discharge.  Neck: Normal range of motion. Neck supple. No JVD present. No thyromegaly present.  Cardiovascular: Normal rate, regular rhythm and intact distal pulses.  Exam reveals no gallop and no friction rub.   No murmur heard. Pulmonary/Chest: Effort normal and breath sounds normal. No respiratory distress. He has no wheezes. He has no rales. He exhibits no tenderness.  Abdominal: Soft. Bowel sounds are normal. He exhibits no distension and no mass. There is no tenderness. There is  no rebound and no guarding.  Genitourinary: Rectum normal, prostate normal and penis normal. Guaiac negative stool.  Musculoskeletal: He exhibits no edema or tenderness.       Cervical back: He exhibits decreased range of motion, pain and spasm.  Lymphadenopathy:    He has no cervical  adenopathy.  Neurological: He is alert and oriented to person, place, and time. He displays normal reflexes. He exhibits normal muscle tone.  Skin: Skin is warm and dry. No rash noted. He is not diaphoretic. No erythema. No pallor.  Psychiatric: He has a normal mood and affect. His behavior is normal. Judgment and thought content normal.  Nursing note and vitals reviewed.  BP 123/74 mmHg  Pulse 62  Temp(Src) 97.4 F (36.3 C) (Oral)  Ht 6' (1.829 m)  Wt 218 lb 6.4 oz (99.066 kg)  BMI 29.61 kg/m2  SpO2 98% Wt Readings from Last 3 Encounters:  06/12/16 218 lb 6.4 oz (99.066 kg)  04/07/16 216 lb (97.977 kg)  04/02/16 219 lb (99.338 kg)     Lab Results  Component Value Date   WBC 6.7 02/26/2016   HGB 15.8 02/26/2016   HCT 45.6 02/26/2016   PLT 183.0 02/26/2016   GLUCOSE 170* 02/26/2016   CHOL 232* 02/26/2016   TRIG 245.0* 02/26/2016   HDL 36.20* 02/26/2016   LDLDIRECT 152.0 02/26/2016   LDLCALC 128* 11/06/2015   ALT 31 02/26/2016   AST 21 02/26/2016   NA 136 02/26/2016   K 3.9 02/26/2016   CL 101 02/26/2016   CREATININE 0.80 02/26/2016   BUN 13 02/26/2016   CO2 27 02/26/2016   TSH 0.97 10/25/2012   PSA 1.04 11/06/2015   HGBA1C 6.9* 02/26/2016   MICROALBUR 1.2 02/26/2016    Mr Brain Wo Contrast  12/29/2015  CLINICAL DATA:  Increasing cognitive problems, word-finding issues, disorientation, headache, and blurred vision. Symptom duration unspecified. EXAM: MRI HEAD WITHOUT CONTRAST TECHNIQUE: Multiplanar, multiecho pulse sequences of the brain and surrounding structures were obtained without intravenous contrast. COMPARISON:  MR brain 10/04/2008. FINDINGS: No evidence for acute infarction, hemorrhage, mass lesion, hydrocephalus, or extra-axial fluid. Moderate cerebral and cerebellar atrophy. No significant white matter disease. Slight prominence perivascular spaces could represent sequelae of hypertension, but no significant white matter disease is observed. Falx  ossification. Flow voids are maintained throughout the carotid, basilar, and vertebral arteries. There are no areas of chronic hemorrhage. Pituitary, pineal, and cerebellar tonsils unremarkable. No upper cervical lesions. Visualized calvarium, skull base, and upper cervical osseous structures unremarkable. Scalp and extracranial soft tissues, orbits, sinuses, and mastoids show no acute process. Compared with 2009, slight progression of cerebral volume loss. IMPRESSION: Moderate cerebral and cerebellar atrophy. Mild progression since 2009. No significant white matter disease. No acute intracranial findings. Electronically Signed   By: Staci Righter M.D.   On: 12/29/2015 10:30     Assessment & Plan:  Plan I am having Mr. Junker maintain his Ibuprofen, CONTOUR BLOOD GLUCOSE SYSTEM, BAYER CONTOUR TEST, BAYER MICROLET 2 LANCING DEVIC, BAYER MICROLET LANCETS, oxyCODONE-acetaminophen, simvastatin, sertraline, piroxicam, omeprazole, metFORMIN, lisinopril, and gabapentin.  Meds ordered this encounter  Medications  . oxyCODONE-acetaminophen (ROXICET) 5-325 MG tablet    Sig: Take 1 tablet by mouth every 8 (eight) hours as needed for severe pain.    Dispense:  60 tablet    Refill:  0  . simvastatin (ZOCOR) 20 MG tablet    Sig: TAKE 1 TABLET (20 MG TOTAL) BY MOUTH AT BEDTIME.    Dispense:  30 tablet  Refill:  0    No refills. Repeat labs are due now  . sertraline (ZOLOFT) 100 MG tablet    Sig: 2 po qd    Dispense:  60 tablet    Refill:  5  . piroxicam (FELDENE) 10 MG capsule    Sig: Take 1 capsule (10 mg total) by mouth daily.    Dispense:  30 capsule    Refill:  2  . omeprazole (PRILOSEC) 40 MG capsule    Sig: TAKE 1 CAPSULE (40 MG TOTAL) BY MOUTH DAILY.    Dispense:  30 capsule    Refill:  3  . metFORMIN (GLUCOPHAGE-XR) 500 MG 24 hr tablet    Sig: TAKE 1 TABLET (500 MG TOTAL) BY MOUTH DAILY WITH BREAKFAST.    Dispense:  30 tablet    Refill:  0  . lisinopril (PRINIVIL,ZESTRIL) 5 MG tablet     Sig: Take 1 tablet (5 mg total) by mouth daily.    Dispense:  90 tablet    Refill:  3  . gabapentin (NEURONTIN) 300 MG capsule    Sig: Take 1 capsule (300 mg total) by mouth 3 (three) times daily.    Dispense:  180 capsule    Refill:  3    Problem List Items Addressed This Visit    Depression   Relevant Medications   sertraline (ZOLOFT) 100 MG tablet   DM (diabetes mellitus) type II uncontrolled, periph vascular disorder (HCC)   Relevant Medications   simvastatin (ZOCOR) 20 MG tablet   metFORMIN (GLUCOPHAGE-XR) 500 MG 24 hr tablet   lisinopril (PRINIVIL,ZESTRIL) 5 MG tablet   Other Relevant Orders   Hemoglobin A1c   Joint pain   Relevant Medications   gabapentin (NEURONTIN) 300 MG capsule   Preventative health care - Primary    ghm utd Check labs See AVS       Relevant Orders   Comprehensive metabolic panel   CBC with Differential/Platelet   Lipid panel   POCT urinalysis dipstick   PSA   Hemoglobin A1c    Other Visit Diagnoses    Chronic pain        Relevant Medications    oxyCODONE-acetaminophen (ROXICET) 5-325 MG tablet    sertraline (ZOLOFT) 100 MG tablet    piroxicam (FELDENE) 10 MG capsule    gabapentin (NEURONTIN) 300 MG capsule    Spinal stenosis in cervical region        Relevant Orders    Ambulatory referral to Neurosurgery    Essential hypertension        Relevant Medications    simvastatin (ZOCOR) 20 MG tablet    lisinopril (PRINIVIL,ZESTRIL) 5 MG tablet    Other Relevant Orders    Comprehensive metabolic panel    CBC with Differential/Platelet    Hyperlipidemia        Relevant Medications    simvastatin (ZOCOR) 20 MG tablet    lisinopril (PRINIVIL,ZESTRIL) 5 MG tablet    Other Relevant Orders    Comprehensive metabolic panel    Lipid panel    Gastroesophageal reflux disease, esophagitis presence not specified        Relevant Medications    omeprazole (PRILOSEC) 40 MG capsule       Follow-up: Return in about 6 months (around 12/12/2016),  or if symptoms worsen or fail to improve, for htn, hyperlipidemia, DM.  [name], DO

## 2016-06-12 NOTE — Patient Instructions (Signed)

## 2016-06-12 NOTE — Assessment & Plan Note (Signed)
ghm utd Check labs See AVS 

## 2016-06-25 ENCOUNTER — Other Ambulatory Visit: Payer: Self-pay

## 2016-06-25 DIAGNOSIS — E785 Hyperlipidemia, unspecified: Secondary | ICD-10-CM

## 2016-06-25 MED ORDER — SIMVASTATIN 40 MG PO TABS: 40.0000 mg | ORAL_TABLET | Freq: Every day | ORAL | Status: DC

## 2016-06-25 MED FILL — SIMVASTATIN 40 MG TABLET: 40 | 30 days supply | Qty: 30 | Fill #0

## 2016-06-30 ENCOUNTER — Other Ambulatory Visit: Payer: Self-pay | Admitting: Family Medicine

## 2016-06-30 MED FILL — METFORMIN HCL ER 500 MG TAB: 500 | 30 days supply | Qty: 30 | Fill #0

## 2016-07-17 MED FILL — SERTRALINE HCL 100 MG TAB: 100 | 30 days supply | Qty: 60 | Fill #0

## 2016-07-17 MED FILL — PIROXICAM 10 MG CAPSULE: 10 | 30 days supply | Qty: 30 | Fill #2

## 2016-07-17 MED FILL — OMEPRAZOLE DR 40 MG CAPSULE: 40 | 30 days supply | Qty: 30 | Fill #1

## 2016-07-18 ENCOUNTER — Other Ambulatory Visit: Payer: Self-pay | Admitting: Family Medicine

## 2016-07-18 ENCOUNTER — Telehealth: Payer: Self-pay | Admitting: Family Medicine

## 2016-07-18 DIAGNOSIS — G8929 Other chronic pain: Secondary | ICD-10-CM

## 2016-07-18 MED ORDER — OXYCODONE-ACETAMINOPHEN 5-325 MG PO TABS
1.0000 | ORAL_TABLET | Freq: Three times a day (TID) | ORAL | Status: DC | PRN
Start: 2016-07-18 — End: 2016-08-25

## 2016-07-18 NOTE — Telephone Encounter (Signed)
printed

## 2016-07-18 NOTE — Telephone Encounter (Signed)
Caller name: [place] Relation to [phone] Call back [place]  Reason for call: Pt requesting refill on oxyCODONE-acetaminophen (ROXICET) 5-325 MG tablet. Please advise.

## 2016-07-18 NOTE — Telephone Encounter (Signed)
Last seen and filled 06/12/16 #60 UDS 02/15/15 low risk   Please advise   KP

## 2016-07-21 NOTE — Telephone Encounter (Signed)
Patient aware Rx ready for pick up.      KP 

## 2016-07-25 MED FILL — OXYCODONE/APAP 5-325: 5-325 | 20 days supply | Qty: 60 | Fill #0

## 2016-08-25 ENCOUNTER — Telehealth: Payer: Self-pay | Admitting: Family Medicine

## 2016-08-25 DIAGNOSIS — G8929 Other chronic pain: Secondary | ICD-10-CM

## 2016-08-25 MED ORDER — OXYCODONE-ACETAMINOPHEN 5-325 MG PO TABS: 1.0000 | ORAL_TABLET | Freq: Three times a day (TID) | ORAL | 0 refills | Status: DC | PRN

## 2016-08-25 NOTE — Telephone Encounter (Signed)
Refill x1 

## 2016-08-25 NOTE — Telephone Encounter (Signed)
Patient aware the Med's will be ready for pick up tomorrow.    KP

## 2016-08-25 NOTE — Telephone Encounter (Signed)
Last seen 06/12/16 and filled 07/18/16 #60 UDS 11/06/15 Negative Oxycodone   Please advise    KP

## 2016-08-25 NOTE — Telephone Encounter (Signed)
Caller name: Kennis Relation to pt: self Call back number: [phone] Pharmacy:  Reason for call: Pt came in office stating needing refill on oxyCODONE-acetaminophen (ROXICET) 5-325 MG tablet. Please advise.

## 2016-08-26 ENCOUNTER — Other Ambulatory Visit: Payer: Self-pay | Admitting: Family Medicine

## 2016-08-26 MED FILL — OMEPRAZOLE DR 40 MG CAPSULE: 40 | 30 days supply | Qty: 30 | Fill #2

## 2016-08-26 MED FILL — GABAPENTIN 300 MG CAPSULE: 300 | 60 days supply | Qty: 180 | Fill #1

## 2016-08-26 MED FILL — SERTRALINE HCL 100 MG TAB: 100 | 30 days supply | Qty: 60 | Fill #1

## 2016-08-27 MED FILL — OXYCODONE/APAP 5-325: 5-325 | 20 days supply | Qty: 60 | Fill #0

## 2016-08-27 MED FILL — PIROXICAM 10 MG CAPSULE: 10 | 30 days supply | Qty: 30 | Fill #0

## 2016-08-27 MED FILL — LISINOPRIL 5 MG TABLET: 5 | 90 days supply | Qty: 90 | Fill #2

## 2016-09-11 MED FILL — SIMVASTATIN 40 MG TABLET: 40 | 30 days supply | Qty: 30 | Fill #1

## 2016-09-11 MED FILL — METFORMIN HCL ER 500 MG TAB: 500 | 30 days supply | Qty: 30 | Fill #1

## 2016-10-01 MED FILL — PIROXICAM 10 MG CAPSULE: 10 | 30 days supply | Qty: 30 | Fill #1

## 2016-10-01 MED FILL — OMEPRAZOLE DR 40 MG CAPSULE: 40 | 30 days supply | Qty: 30 | Fill #3

## 2016-10-21 ENCOUNTER — Telehealth: Payer: Self-pay | Admitting: Family Medicine

## 2016-10-21 DIAGNOSIS — G8929 Other chronic pain: Secondary | ICD-10-CM

## 2016-10-21 MED ORDER — OXYCODONE-ACETAMINOPHEN 5-325 MG PO TABS: 1.0000 | ORAL_TABLET | Freq: Three times a day (TID) | ORAL | 0 refills | Status: DC | PRN

## 2016-10-21 NOTE — Telephone Encounter (Signed)
Last seen 6/215/17 and filled 08/25/2016 #60 No UDS  Please advise     KP

## 2016-10-21 NOTE — Telephone Encounter (Signed)
Refill x1 

## 2016-10-21 NOTE — Telephone Encounter (Signed)
Patient aware Rx will be ready for pick up tomorrow.      KP 

## 2016-10-21 NOTE — Telephone Encounter (Signed)
Relation to pt:self °Call back number:[phone] ° ° °Reason for call:  °Patient requesting a refill oxyCODONE-acetaminophen (ROXICET) 5-325 MG tablet ° °

## 2016-10-23 MED FILL — METFORMIN HCL ER 500 MG TAB: 500 | 30 days supply | Qty: 30 | Fill #2

## 2016-10-23 MED FILL — SERTRALINE HCL 100 MG TAB: 100 | 30 days supply | Qty: 60 | Fill #2

## 2016-10-23 MED FILL — GABAPENTIN 300 MG CAPSULE: 300 | 60 days supply | Qty: 180 | Fill #2

## 2016-10-24 MED FILL — OXYCODONE/APAP 5/325 MG TAB: 5-325 | 20 days supply | Qty: 60 | Fill #0

## 2016-12-02 ENCOUNTER — Other Ambulatory Visit: Payer: Self-pay | Admitting: Gastroenterology

## 2016-12-02 MED FILL — METFORMIN HCL ER 500 MG TAB: 500 | 30 days supply | Qty: 30 | Fill #3

## 2016-12-02 MED FILL — LISINOPRIL 5 MG TABLET: 5 | 90 days supply | Qty: 90 | Fill #3

## 2016-12-02 MED FILL — SIMVASTATIN 40 MG TABLET: 40 | 30 days supply | Qty: 30 | Fill #2

## 2016-12-02 MED FILL — SERTRALINE HCL 100 MG TAB: 100 | 30 days supply | Qty: 60 | Fill #3

## 2016-12-02 MED FILL — OMEPRAZOLE DR 40 MG CAPSULE: 40 | 30 days supply | Qty: 30 | Fill #0

## 2016-12-02 MED FILL — PIROXICAM 10 MG CAPSULE: 10 | 30 days supply | Qty: 30 | Fill #2

## 2016-12-03 ENCOUNTER — Telehealth: Payer: Self-pay | Admitting: Family Medicine

## 2016-12-03 DIAGNOSIS — G8929 Other chronic pain: Secondary | ICD-10-CM

## 2016-12-03 MED ORDER — OXYCODONE-ACETAMINOPHEN 5-325 MG PO TABS: 1.0000 | ORAL_TABLET | Freq: Three times a day (TID) | ORAL | 0 refills | Status: DC | PRN

## 2016-12-03 NOTE — Telephone Encounter (Signed)
Rx placed at front desk for pick up and message left on pt's voicemail.

## 2016-12-03 NOTE — Telephone Encounter (Signed)
Last ov?

## 2016-12-03 NOTE — Telephone Encounter (Signed)
Reviewed [place] Controlled Substance registry, and last fill was 10/24/16.

## 2016-12-03 NOTE — Addendum Note (Signed)
Addended by: Debbrah Alar on: 12/03/2016 03:38 PM   Modules accepted: Orders

## 2016-12-03 NOTE — Telephone Encounter (Signed)
Patient's wife is calling requesting a refill of oxyCODONE-acetaminophen (ROXICET) 5-325 MG tablet for the patient. Please advise.    Phone: [phone]

## 2016-12-03 NOTE — Telephone Encounter (Signed)
Last ov 06/12/16. Last fill 10/21/16 #60 0. Please advise. LB

## 2016-12-05 MED FILL — OXYCODONE/APAP 5-325: 5-325 | 20 days supply | Qty: 60 | Fill #0

## 2016-12-18 ENCOUNTER — Ambulatory Visit (INDEPENDENT_AMBULATORY_CARE_PROVIDER_SITE_OTHER): Payer: BLUE CROSS/BLUE SHIELD | Admitting: Podiatry

## 2016-12-18 ENCOUNTER — Encounter: Payer: Self-pay | Admitting: Podiatry

## 2016-12-18 VITALS — BP 139/79 | HR 57 | Ht 72.0 in | Wt 218.0 lb

## 2016-12-18 DIAGNOSIS — M21969 Unspecified acquired deformity of unspecified lower leg: Secondary | ICD-10-CM

## 2016-12-18 DIAGNOSIS — M79672 Pain in left foot: Secondary | ICD-10-CM

## 2016-12-18 DIAGNOSIS — M722 Plantar fascial fibromatosis: Secondary | ICD-10-CM | POA: Diagnosis not present

## 2016-12-18 DIAGNOSIS — B351 Tinea unguium: Secondary | ICD-10-CM | POA: Diagnosis not present

## 2016-12-18 DIAGNOSIS — M79671 Pain in right foot: Secondary | ICD-10-CM | POA: Diagnosis not present

## 2016-12-18 NOTE — Patient Instructions (Signed)
Seen for orthotic prep and problematic nails. All nails debrided. Will re order orthotics with existing file in Lab. Will call when they are ready. Return in 3 month for routine foot care for problematic ingrown nails.

## 2016-12-18 NOTE — Progress Notes (Signed)
Subjective: 60 year old male presents requesting a new pair orthotics. He did well with orthotics he got last year. They have worn out now.  Also having problem with thick and painful nails.  Last blood sugar check was 156 last week. 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  Has Diabetic Neuropathy with burning and tingling sensation on tip of toes x 4-5 years.  Been diabetic for 25 years. Blood sugar is at 130-135 range.   Objective: Dermatologic: Thick dystrophic and painful nails x 10. Vascular: All pedal pulses are palpable. No edema or erythema noted. Neurologic: Subjective numbness and tingling sensation on distal end of all toes.  Positive response 6 out of 6 to monofilament sensory testing bilateral.  Orthopedic: Tight right Achilles tendon. Unable to flex beyond 90 degree with knee extended right lower limb. Left side has normal flexion and extension. Positive of elevated first ray right>left. Forefoot varus.  Assessment: Plantar fasciitis bilateral. Forefoot varus with elevated first ray bilateral.  Faulty biomechanics bilateral. Onychomycosis x 10.  Plan: Reviewed clinical findings and available treatment options. Need custom orthotics to address forefoot varus condition and heel pain.  Will order orthotics with existing file in Lab. All nails debrided. May return in 3 months for routine foot care.

## 2016-12-30 ENCOUNTER — Other Ambulatory Visit: Payer: Self-pay | Admitting: Family Medicine

## 2016-12-30 MED FILL — OMEPRAZOLE DR 40 MG CAPSULE: 40 | 30 days supply | Qty: 30 | Fill #1

## 2016-12-30 MED FILL — SERTRALINE HCL 100 MG TAB: 100 | 30 days supply | Qty: 60 | Fill #4

## 2016-12-30 MED FILL — PIROXICAM 10 MG CAPSULE: 10 | 30 days supply | Qty: 30 | Fill #0

## 2017-01-15 MED FILL — GABAPENTIN 300 MG CAPSULE: 300 | 60 days supply | Qty: 180 | Fill #3

## 2017-02-02 ENCOUNTER — Telehealth: Payer: Self-pay

## 2017-02-02 NOTE — Telephone Encounter (Signed)
Called patient earlier and explained Dr. Tomi Likens wanted him to have neuropsych testing done w/ Dr. Marcos Eke before returning to see him. Patient stated he had already had testing done last year. Called patient back to confirm Dr. Tomi Likens wanted the testing to be repeated in 1 year so yes he would need to schedule w/ Dr. Marcos Eke first before returning to see Dr. Tomi Likens. No answer. Left vmail. Cancld appt w/ Dr. Tomi Likens tomorrow.

## 2017-02-03 ENCOUNTER — Ambulatory Visit: Payer: BLUE CROSS/BLUE SHIELD | Admitting: Neurology

## 2017-02-11 ENCOUNTER — Telehealth: Payer: Self-pay | Admitting: Family Medicine

## 2017-02-11 DIAGNOSIS — G8929 Other chronic pain: Secondary | ICD-10-CM

## 2017-02-11 MED FILL — PIROXICAM 10 MG CAPSULE: 10 | 30 days supply | Qty: 30 | Fill #1

## 2017-02-11 MED FILL — SERTRALINE HCL 100 MG TAB: 100 | 30 days supply | Qty: 60 | Fill #5

## 2017-02-11 MED FILL — OMEPRAZOLE DR 40 MG CAPSULE: 40 | 30 days supply | Qty: 30 | Fill #2

## 2017-02-11 MED FILL — METFORMIN HCL ER 500 MG TAB: 500 | 30 days supply | Qty: 30 | Fill #4

## 2017-02-11 NOTE — Telephone Encounter (Signed)
Relation to pt:self °Call back number:[phone] ° ° °Reason for call:  °Patient requesting a refill oxyCODONE-acetaminophen (ROXICET) 5-325 MG tablet ° °

## 2017-02-12 NOTE — Telephone Encounter (Signed)
Last ov 06/12/16.  Last fill 12/03/16 #60 0. Last UDS 02/15/15. Please advise. LB

## 2017-02-13 MED ORDER — OXYCODONE-ACETAMINOPHEN 5-325 MG PO TABS: 1.0000 | ORAL_TABLET | Freq: Three times a day (TID) | ORAL | 0 refills | Status: DC | PRN

## 2017-02-13 MED FILL — OXYCODONE/APAP 5/325 MG TAB: 5-325 | 20 days supply | Qty: 60 | Fill #0

## 2017-02-13 NOTE — Telephone Encounter (Signed)
PCP approved/Printed and she signed. Patient will pickup at the front desk.

## 2017-02-13 NOTE — Telephone Encounter (Deleted)
Patient scheduled for 03/02/2017

## 2017-02-13 NOTE — Addendum Note (Signed)
Addended by: Sharon Seller B on: 02/13/2017 02:10 PM   Modules accepted: Orders

## 2017-02-13 NOTE — Telephone Encounter (Signed)
PCP aprr

## 2017-02-18 LAB — HM DIABETES EYE EXAM

## 2017-03-13 ENCOUNTER — Other Ambulatory Visit: Payer: Self-pay | Admitting: Family Medicine

## 2017-03-13 DIAGNOSIS — I10 Essential (primary) hypertension: Secondary | ICD-10-CM

## 2017-03-13 DIAGNOSIS — F329 Major depressive disorder, single episode, unspecified: Secondary | ICD-10-CM

## 2017-03-13 DIAGNOSIS — F32A Depression, unspecified: Secondary | ICD-10-CM

## 2017-03-13 MED FILL — LISINOPRIL 5 MG TABLET: 5 | 30 days supply | Qty: 30 | Fill #0

## 2017-03-13 MED FILL — OMEPRAZOLE DR 40 MG CAPSULE: 40 | 30 days supply | Qty: 30 | Fill #3

## 2017-03-13 MED FILL — METFORMIN HCL ER 500 MG TAB: 500 | 30 days supply | Qty: 30 | Fill #5

## 2017-03-13 MED FILL — PIROXICAM 10 MG CAPSULE: 10 | 30 days supply | Qty: 30 | Fill #2

## 2017-03-13 MED FILL — SERTRALINE HCL 100 MG TAB: 100 | 30 days supply | Qty: 60 | Fill #0

## 2017-03-17 ENCOUNTER — Encounter: Payer: BLUE CROSS/BLUE SHIELD | Admitting: Psychology

## 2017-03-17 ENCOUNTER — Encounter: Payer: Self-pay | Admitting: Psychology

## 2017-03-17 DIAGNOSIS — Z029 Encounter for administrative examinations, unspecified: Secondary | ICD-10-CM

## 2017-03-18 ENCOUNTER — Ambulatory Visit: Payer: BLUE CROSS/BLUE SHIELD | Admitting: Podiatry

## 2017-04-13 ENCOUNTER — Other Ambulatory Visit: Payer: Self-pay | Admitting: Gastroenterology

## 2017-04-13 ENCOUNTER — Other Ambulatory Visit: Payer: Self-pay | Admitting: Family Medicine

## 2017-04-13 DIAGNOSIS — M2550 Pain in unspecified joint: Secondary | ICD-10-CM

## 2017-04-13 DIAGNOSIS — F32A Depression, unspecified: Secondary | ICD-10-CM

## 2017-04-13 DIAGNOSIS — F329 Major depressive disorder, single episode, unspecified: Secondary | ICD-10-CM

## 2017-04-13 DIAGNOSIS — I10 Essential (primary) hypertension: Secondary | ICD-10-CM

## 2017-04-13 DIAGNOSIS — G8929 Other chronic pain: Secondary | ICD-10-CM

## 2017-04-13 NOTE — Telephone Encounter (Addendum)
You have not seen this pt in clinic.  He did have endo/colon in Jan of 2017. Just want to make sure ok to send in Omeprazole??

## 2017-04-13 NOTE — Telephone Encounter (Signed)
Relation to KW:IOXB Call back number:[phone]   Reason for call:  Patient requesting a refill oxyCODONE-acetaminophen (ROXICET) 5-325 MG tablet

## 2017-04-13 NOTE — Telephone Encounter (Signed)
Filling in for Dr. Etter Sjogren  Requesting:oxycodine Contract: 01/27/17 UDS: None Last OV: 06/12/17 Last Refill:  02/13/17   #[id number]  Please Advise

## 2017-04-13 NOTE — Telephone Encounter (Signed)
OK to refill Oxycodone

## 2017-04-13 NOTE — Telephone Encounter (Signed)
Yes we can refill it #90, RF1, but I would like to see him back in clinic for a follow up if you can help coordinate. Thanks

## 2017-04-14 MED ORDER — OXYCODONE-ACETAMINOPHEN 5-325 MG PO TABS: 1.0000 | ORAL_TABLET | Freq: Three times a day (TID) | ORAL | 0 refills | Status: DC | PRN

## 2017-04-14 MED FILL — PIROXICAM 10 MG CAPSULE: 10 | 30 days supply | Qty: 30 | Fill #0

## 2017-04-14 MED FILL — SERTRALINE HCL 100 MG TAB: 100 | 30 days supply | Qty: 60 | Fill #0

## 2017-04-14 MED FILL — GABAPENTIN 300 MG CAPSULE: 300 | 60 days supply | Qty: 180 | Fill #0

## 2017-04-14 MED FILL — OMEPRAZOLE DR 40 MG CAPSULE: 40 | 90 days supply | Qty: 90 | Fill #0

## 2017-04-14 MED FILL — LISINOPRIL 5 MG TABLET: 5 | 30 days supply | Qty: 30 | Fill #0

## 2017-04-14 NOTE — Telephone Encounter (Signed)
Called the patient on cell/home number left detailed message hardcopy for oxy is ready for pickup

## 2017-04-14 NOTE — Telephone Encounter (Signed)
Printed prescription and on counter for pcp to sign.

## 2017-04-16 MED FILL — OXYCODONE/APAP 5/325 MG TAB: 5-325 | 20 days supply | Qty: 60 | Fill #0

## 2017-04-29 ENCOUNTER — Telehealth: Payer: Self-pay | Admitting: Family Medicine

## 2017-04-29 NOTE — Telephone Encounter (Signed)
Received Application for Disability Parking placard, forwarded to provider/SLS 05/02

## 2017-04-29 NOTE — Telephone Encounter (Signed)
Pt dropped off document to be filled out (Application for Disability Parking Placard) pt would like to be called at [phone] when document ready. Document put at front office tray.

## 2017-04-30 ENCOUNTER — Ambulatory Visit (INDEPENDENT_AMBULATORY_CARE_PROVIDER_SITE_OTHER): Payer: BLUE CROSS/BLUE SHIELD | Admitting: Family Medicine

## 2017-04-30 ENCOUNTER — Encounter: Payer: Self-pay | Admitting: Family Medicine

## 2017-04-30 ENCOUNTER — Telehealth: Payer: Self-pay | Admitting: Family Medicine

## 2017-04-30 VITALS — BP 149/71 | HR 58 | Temp 97.8°F | Ht 72.0 in | Wt 226.8 lb

## 2017-04-30 DIAGNOSIS — N4889 Other specified disorders of penis: Secondary | ICD-10-CM

## 2017-04-30 NOTE — Patient Instructions (Addendum)
If nobody contacts you by 9 AM tomorrow, call our office and ask for [employer].   If you start having worsening enlargement of the "bruise", worsening pain, or start losing sensation of your penis, go to the ER.   OK to use your pain medicine for this.  Ice/cold pack over area for 10-15 min every 2-3 hours while awake.  Try to avoid ibuprofen, aspirin, Aleve, Advil or other NSAID (non-steroidal anti-inflammatories) until you are seen by a specialist.

## 2017-04-30 NOTE — Progress Notes (Signed)
Chief Complaint  Patient presents with  . Penis swollen and painful    pain radiating to the lower abd-started this am    Subjective: Patient is a 61 y.o. male here for a bruise on his penis.  This morning, the patient noticed a bruise on his penis. He also had some mild pain in the right groin region. Over the course of the day, the bruise has grown a knees noticed more spots over his scrotum and further up his penis. Pain is also increased. He denies any trauma, sexual activity, oral sex, aspirin use, or oral anticoagulation use. He does not have a personal or family history of bleeding disorders. He is not bruising anywhere else. He is not having any fevers, diarrhea/constipation, urinary complaints, discharge, new sexual partners, testicular pain, or other skin lesions.   ROS: GU: as noted in HPI Const: No fevers  Family History  Problem Relation Age of Onset  . Alcohol abuse    . Depression    . Arthritis    . Hypertension    . Ovarian cancer Sister   . Stomach cancer Sister   . Heart disease Sister     MI  . Stomach cancer Maternal Grandmother   . COPD Mother   . Stroke Father   . Coronary artery disease    . Heart disease Sister     MI  . Ovarian cancer      neice  . Ovarian cancer      neice  . Colon cancer     Past Medical History:  Diagnosis Date  . Anxiety   . Arthritis   . Depression   . Diabetes mellitus   . GERD (gastroesophageal reflux disease)   . Hyperlipidemia   . Hypertension   . Neuromuscular disorder ([place])    NEUROPATHY  . Sleep apnea    Allergies  Allergen Reactions  . Niacin     REACTION: rash    Current Outpatient Prescriptions:  .  BAYER CONTOUR TEST test strip, Check Blood sugar once daily. Dx E11.9, Disp: 50 each, Rfl: 12 .  BAYER MICROLET LANCETS lancets, Check Blood sugar once daily. Dx E11.9, Disp: 50 each, Rfl: 11 .  Blood Glucose Monitoring Suppl (CONTOUR BLOOD GLUCOSE SYSTEM) DEVI, Check Blood sugar once daily. Dx E11.9,  Disp: 1 Device, Rfl: 0 .  gabapentin (NEURONTIN) 300 MG capsule, TAKE 1 CAPSULE (300 MG TOTAL) BY MOUTH 3 (THREE) TIMES DAILY., Disp: 180 capsule, Rfl: 0 .  Ibuprofen 200 MG CAPS, Take by mouth. Patient takes 5-20 tablets daily., Disp: , Rfl:  .  Lancet Devices (BAYER MICROLET 2 LANCING DEVIC) MISC, Check Blood sugar once daily. Dx E11.9, Disp: 50 each, Rfl: 11 .  lisinopril (PRINIVIL,ZESTRIL) 5 MG tablet, Take 1 tablet (5 mg total) by mouth daily., Disp: 90 tablet, Rfl: 3 .  lisinopril (PRINIVIL,ZESTRIL) 5 MG tablet, TAKE 1 TABLET (5 MG TOTAL) BY MOUTH DAILY. **NEED OFFICE VISIT FOR ADDITIONAL REFILLS**, Disp: 30 tablet, Rfl: 0 .  metFORMIN (GLUCOPHAGE-XR) 500 MG 24 hr tablet, TAKE 1 TABLET (500 MG TOTAL) BY MOUTH DAILY WITH BREAKFAST., Disp: 30 tablet, Rfl: 5 .  omeprazole (PRILOSEC) 40 MG capsule, TAKE 1 CAPSULE (40 MG TOTAL) BY MOUTH DAILY., Disp: 30 capsule, Rfl: 3 .  omeprazole (PRILOSEC) 40 MG capsule, TAKE 1 CAPSULE (40 MG TOTAL) BY MOUTH DAILY., Disp: 90 capsule, Rfl: 1 .  oxyCODONE-acetaminophen (ROXICET) 5-325 MG tablet, Take 1 tablet by mouth every 8 (eight) hours as needed for severe pain.,  Disp: 60 tablet, Rfl: 0 .  piroxicam (FELDENE) 10 MG capsule, Take 1 capsule (10 mg total) by mouth daily., Disp: 30 capsule, Rfl: 2 .  piroxicam (FELDENE) 10 MG capsule, TAKE ONE CAPSULE BY MOUTH DAILY, Disp: 30 capsule, Rfl: 0 .  sertraline (ZOLOFT) 100 MG tablet, 2 po qd, Disp: 60 tablet, Rfl: 5 .  sertraline (ZOLOFT) 100 MG tablet, TAKE 2 TABLETS BY MOUTH DAILY **NEED OFFICE VISIT FOR ADDITIONAL REFILLS**, Disp: 60 tablet, Rfl: 0 .  simvastatin (ZOCOR) 40 MG tablet, Take 1 tablet (40 mg total) by mouth at bedtime., Disp: 30 tablet, Rfl: 5  Objective: BP (!) 149/71 (BP Location: Left Arm, Patient Position: Sitting, Cuff Size: Normal)   Pulse (!) 58   Temp 97.8 F (36.6 C) (Oral)   Ht 6' (1.829 m)   Wt 226 lb 12.8 oz (102.9 kg)   SpO2 100%   BMI 30.76 kg/m  General: Awake, appears stated  age Heart: RRR, no murmurs Lungs: CTAB, no rales, wheezes or rhonchi. No accessory muscle use Abd: BS+, soft, NT, mild distension, no masses or organomegaly GU: Circumcised, no masses or testicular tenderness, no hernia, the left distal shaft of his penis, there is a hematoma approximately 1.2 cm in diameter. There is no erythema. Ecchymosis appreciated along the shaft of penis as well as the scrotum near the base. I do not appreciate any other skin lesions. There is no discharge. The glans is unaffected. Psych: Age appropriate judgment and insight, normal affect and mood  Assessment and Plan: Nontraumatic hematoma of penis - Plan: Ambulatory referral to Urology  Discussed case with referral team and we will get the patient with [facility] tomorrow. If nobody contacts him by tomorrow 9 AM, he will call. Go to ER if worsening. Ice. Avoid NSAIDs. OK to use pain medicine. F/u prn. The patient voiced understanding and agreement to the plan.  [place], DO 04/30/17  4:48 PM

## 2017-04-30 NOTE — Progress Notes (Signed)
Pre visit review using our clinic review tool, if applicable. No additional management support is needed unless otherwise documented below in the visit note. 

## 2017-04-30 NOTE — Telephone Encounter (Signed)
Patient called stating he has blood building up in his penis and it seems to be going into his abdomen. States he is seeing bruises on his penis. Transferred to [facility]

## 2017-04-30 NOTE — Telephone Encounter (Signed)
Patient Name: MUKHTAR Bensman  DOB: [birth year]    Initial Comment Caller states he has a large blood sac on his penis that keeps getting bigger and bigger. Easily bruising. Lower Abdominal pain.    Nurse Assessment  Nurse: Raphael Gibney, RN, [place] Date/Time (Eastern Time): 04/30/2017 2:50:03 PM  Confirm and document reason for call. If symptomatic, describe symptoms. ---Caller states he has a large blood sac on his penis that is getting bigger. Area is bruised. Has lower abd pain. Pain on both sides of his lower abd. Pain is worst on the right side. Pain level 4. Pain is constant.  Does the patient have any new or worsening symptoms? ---Yes  Will a triage be completed? ---Yes  Related visit to physician within the last 2 weeks? ---No  Does the PT have any chronic conditions? (i.e. diabetes, asthma, etc.) ---No  Is this a behavioral health or substance abuse call? ---No     Guidelines    Guideline Title Affirmed Question Affirmed Notes  Abdominal Pain - Male [1] MILD-MODERATE pain AND [2] constant AND [3] present > 2 hours    Final Disposition User   See Physician within 4 Hours (or PCP triage) Raphael Gibney, RN, Vanita Ingles    Comments  appt scheduled for 4 pm 04/30/2017 with Dr. Riki Sheer   Referrals  REFERRED TO PCP OFFICE   Disagree/Comply: Comply

## 2017-05-01 NOTE — Telephone Encounter (Signed)
Patient saw Dr. Nani Ravens 04/30/17

## 2017-05-04 NOTE — Telephone Encounter (Signed)
To Reiterate: Received Application for Disability Parking placard, forwarded to provider/SLS 05/02

## 2017-05-04 NOTE — Telephone Encounter (Signed)
Pt came in the office wanting to know about his paperwork and wanted to be informed ASAP when document is ready so he can take it to the [facility] (please call pt at tel mentioned below. Please advise.

## 2017-05-05 ENCOUNTER — Telehealth: Payer: Self-pay

## 2017-05-05 NOTE — Telephone Encounter (Signed)
Placard form filled out patient made aware it is ready for pick up in the front ofc.    PC

## 2017-05-20 ENCOUNTER — Other Ambulatory Visit: Payer: Self-pay | Admitting: Family Medicine

## 2017-05-20 DIAGNOSIS — F329 Major depressive disorder, single episode, unspecified: Secondary | ICD-10-CM

## 2017-05-20 DIAGNOSIS — I10 Essential (primary) hypertension: Secondary | ICD-10-CM

## 2017-05-20 DIAGNOSIS — F32A Depression, unspecified: Secondary | ICD-10-CM

## 2017-05-21 MED FILL — LISINOPRIL 5 MG TABLET: 5 | 30 days supply | Qty: 30 | Fill #0

## 2017-05-21 MED FILL — PIROXICAM 10 MG CAPSULE: 10 | 30 days supply | Qty: 30 | Fill #0

## 2017-05-21 MED FILL — SERTRALINE HCL 100 MG TAB: 100 | 30 days supply | Qty: 60 | Fill #0

## 2017-05-21 MED FILL — METFORMIN HCL ER 500 MG TAB: 500 | 30 days supply | Qty: 30 | Fill #0

## 2017-06-12 ENCOUNTER — Other Ambulatory Visit: Payer: Self-pay | Admitting: Family Medicine

## 2017-06-12 DIAGNOSIS — G8929 Other chronic pain: Secondary | ICD-10-CM

## 2017-06-12 MED ORDER — OXYCODONE-ACETAMINOPHEN 5-325 MG PO TABS: 1.0000 | ORAL_TABLET | Freq: Three times a day (TID) | ORAL | 0 refills | Status: DC | PRN

## 2017-06-12 NOTE — Telephone Encounter (Signed)
Database on your desk

## 2017-06-12 NOTE — Telephone Encounter (Signed)
Requesting:   oxycodone Contract  08/25/2016 UDS   Low risk last done on 11/06/2015 Last OV    06/12/2016 Last Refill   #60 no refills on 04/14/2017  Please Advise

## 2017-06-12 NOTE — Telephone Encounter (Signed)
Refill x1 

## 2017-06-12 NOTE — Telephone Encounter (Signed)
Printed and pcp signed Notified prescription is ready for pickup

## 2017-06-12 NOTE — Telephone Encounter (Signed)
Relation to LZ:JQBH Call back number:[phone]   Reason for call:  Patient requesting a refill oxyCODONE-acetaminophen (ROXICET) 5-325 MG tablet

## 2017-06-12 NOTE — Telephone Encounter (Signed)
database 

## 2017-06-17 MED FILL — OXYCODONE/APAP 5/325 MG TAB: 5-325 | 20 days supply | Qty: 60 | Fill #0

## 2017-06-29 ENCOUNTER — Other Ambulatory Visit: Payer: Self-pay | Admitting: Family Medicine

## 2017-06-29 DIAGNOSIS — G8929 Other chronic pain: Secondary | ICD-10-CM

## 2017-06-29 DIAGNOSIS — F32A Depression, unspecified: Secondary | ICD-10-CM

## 2017-06-29 DIAGNOSIS — I10 Essential (primary) hypertension: Secondary | ICD-10-CM

## 2017-06-29 DIAGNOSIS — F329 Major depressive disorder, single episode, unspecified: Secondary | ICD-10-CM

## 2017-06-29 DIAGNOSIS — M2550 Pain in unspecified joint: Secondary | ICD-10-CM

## 2017-06-29 MED FILL — PIROXICAM 10 MG CAPSULE: 10 | 30 days supply | Qty: 30 | Fill #0

## 2017-06-29 MED FILL — SERTRALINE HCL 100 MG TAB: 100 | 30 days supply | Qty: 60 | Fill #0

## 2017-06-29 MED FILL — OMEPRAZOLE DR 40 MG CAPSULE: 40 | 90 days supply | Qty: 90 | Fill #1

## 2017-06-29 MED FILL — LISINOPRIL 5 MG TAB: 5 | 30 days supply | Qty: 30 | Fill #0

## 2017-06-29 MED FILL — METFORMIN HCL ER 500 MG TAB: 500 | 30 days supply | Qty: 30 | Fill #1

## 2017-06-30 MED FILL — GABAPENTIN 300 MG CAPSULE: 300 | 60 days supply | Qty: 180 | Fill #0

## 2017-08-03 ENCOUNTER — Telehealth: Payer: Self-pay | Admitting: Family Medicine

## 2017-08-03 ENCOUNTER — Encounter: Payer: Self-pay | Admitting: Family Medicine

## 2017-08-03 DIAGNOSIS — G8929 Other chronic pain: Secondary | ICD-10-CM

## 2017-08-03 MED ORDER — OXYCODONE-ACETAMINOPHEN 5-325 MG PO TABS: 1.0000 | ORAL_TABLET | Freq: Three times a day (TID) | ORAL | 0 refills | Status: DC | PRN

## 2017-08-03 NOTE — Telephone Encounter (Signed)
Requesting:   oxycodone Contract   08/25/2016 UDS   Low risk on 05/05/2016 Last OV    06/12/2016----future appt is on 09/14/2017 Last Refill    #60 no refills on 06/12/2017  Please Advise

## 2017-08-03 NOTE — Telephone Encounter (Signed)
He can #10 tabs to hold him til seen in September, Oxycodone requires he be seen every 6 months. Needs updated UDS and contract

## 2017-08-03 NOTE — Telephone Encounter (Signed)
Self  Refill for oxyCODONE   CB: [phone]

## 2017-08-03 NOTE — Telephone Encounter (Signed)
Printed as covering provider instructed. Called the patient informed of instructions/he verbally understood/agreed to Printed contract/attached to hardcopy/note to update UDS.

## 2017-08-11 ENCOUNTER — Encounter: Payer: Self-pay | Admitting: Family Medicine

## 2017-08-11 MED ORDER — OXYCODONE-ACETAMINOPHEN 5-325 MG PO TABS: 1.0000 | ORAL_TABLET | Freq: Three times a day (TID) | ORAL | 0 refills | Status: DC | PRN

## 2017-08-11 MED FILL — OXYCODONE-ACETAMINOPHEN 5-3: 5-325 | 30 days supply | Qty: 90 | Fill #0

## 2017-08-11 NOTE — Addendum Note (Signed)
Addended by: Kem Boroughs D on: 08/11/2017 01:39 PM   Modules accepted: Orders

## 2017-08-11 NOTE — Telephone Encounter (Signed)
Rx per Dr. Etter Sjogren rewritten for 90 tabs but he must keep appt in sept before any more refills

## 2017-09-14 ENCOUNTER — Encounter: Payer: Self-pay | Admitting: Family Medicine

## 2017-09-14 ENCOUNTER — Ambulatory Visit (INDEPENDENT_AMBULATORY_CARE_PROVIDER_SITE_OTHER): Payer: Self-pay | Admitting: Family Medicine

## 2017-09-14 VITALS — BP 114/72 | HR 66 | Temp 97.5°F | Ht 72.0 in | Wt 224.4 lb

## 2017-09-14 DIAGNOSIS — Z0000 Encounter for general adult medical examination without abnormal findings: Secondary | ICD-10-CM

## 2017-09-14 DIAGNOSIS — E785 Hyperlipidemia, unspecified: Secondary | ICD-10-CM

## 2017-09-14 DIAGNOSIS — E1151 Type 2 diabetes mellitus with diabetic peripheral angiopathy without gangrene: Secondary | ICD-10-CM

## 2017-09-14 DIAGNOSIS — IMO0002 Reserved for concepts with insufficient information to code with codable children: Secondary | ICD-10-CM

## 2017-09-14 DIAGNOSIS — E1165 Type 2 diabetes mellitus with hyperglycemia: Secondary | ICD-10-CM

## 2017-09-14 DIAGNOSIS — I10 Essential (primary) hypertension: Secondary | ICD-10-CM | POA: Insufficient documentation

## 2017-09-14 DIAGNOSIS — E118 Type 2 diabetes mellitus with unspecified complications: Secondary | ICD-10-CM

## 2017-09-14 LAB — POC URINALSYSI DIPSTICK (AUTOMATED)
Bilirubin, UA: NEGATIVE
Blood, UA: NEGATIVE
GLUCOSE UA: 1000
KETONES UA: NEGATIVE
LEUKOCYTES UA: NEGATIVE
NITRITE UA: NEGATIVE
PROTEIN UA: NEGATIVE
SPEC GRAV UA: 1.025 (ref 1.010–1.025)
UROBILINOGEN UA: 0.2 U/dL
pH, UA: 6 (ref 5.0–8.0)

## 2017-09-14 LAB — HEMOCCULT GUIAC POC 1CARD (OFFICE): FECAL OCCULT BLD: NEGATIVE

## 2017-09-14 NOTE — Assessment & Plan Note (Signed)
Tolerating statin, encouraged heart healthy diet, avoid trans fats, minimize simple carbs and saturated fats. Increase exercise as tolerated 

## 2017-09-14 NOTE — Progress Notes (Addendum)
Patient ID: Ronald Petersen, male    DOB: 1956/01/10  Age: 61 y.o. MRN: [id number]    Subjective:  Subjective  HPI Ronald Petersen presents for cpe   No complaints.    HPI HYPERTENSION   Blood pressure range-not checking   Chest pain- no      Dyspnea- no Lightheadedness- no   Edema- no  Other side effects - no   Medication compliance: good Low salt diet- yes    DIABETES    Blood Sugar ranges-up and down   Polyuria- no New Visual problems- no  Hypoglycemic symptoms- no  Other side effects-no Medication compliance - good  Last eye exam-few months ago Foot exam- toay   HYPERLIPIDEMIA  Medication compliance- poor RUQ pain- no  Muscle aches- no Other side effects-no   Review of Systems  Constitutional: Negative for fatigue and unexpected weight change.  HENT: Negative for congestion, ear pain, hearing loss, nosebleeds, postnasal drip, rhinorrhea, sinus pressure, sneezing and tinnitus.   Eyes: Negative for photophobia, discharge, itching and visual disturbance.  Respiratory: Negative.  Negative for cough and shortness of breath.   Cardiovascular: Negative.  Negative for chest pain and palpitations.  Gastrointestinal: Negative for abdominal distention, abdominal pain, anal bleeding, blood in stool and constipation.  Endocrine: Negative.   Genitourinary: Negative.   Musculoskeletal: Negative.   Skin: Negative.   Allergic/Immunologic: Negative.   Neurological: Negative for dizziness, weakness, light-headedness, numbness and headaches.  Psychiatric/Behavioral: Negative for agitation, confusion, decreased concentration, dysphoric mood, sleep disturbance and suicidal ideas. The patient is not nervous/anxious.     History Past Medical History:  Diagnosis Date  . Anxiety   . Arthritis   . Depression   . Diabetes mellitus   . GERD (gastroesophageal reflux disease)   . Hyperlipidemia   . Hypertension   . Neuromuscular disorder ([place])    NEUROPATHY  . Sleep apnea      He has a past surgical history that includes Appendectomy; Cholecystectomy; and Lumbar laminectomy.   His family history includes Alcohol abuse in his unknown relative; Arthritis in his unknown relative; COPD in his mother; Colon cancer in his unknown relative; Coronary artery disease in his unknown relative; Depression in his unknown relative; Heart disease in his sister and sister; Hypertension in his unknown relative; Ovarian cancer in his sister, unknown relative, and unknown relative; Stomach cancer in his maternal grandmother and sister; Stroke in his father.He reports that he quit smoking about 31 years ago. His smoking use included Cigarettes. He quit after 16.00 years of use. He has never used smokeless tobacco. He reports that he drinks about 2.4 - 3.0 oz of alcohol per week . He reports that he does not use drugs.  Current Outpatient Prescriptions on File Prior to Visit  Medication Sig Dispense Refill  . BAYER CONTOUR TEST test strip Check Blood sugar once daily. Dx E11.9 50 each 12  . BAYER MICROLET LANCETS lancets Check Blood sugar once daily. Dx E11.9 50 each 11  . Blood Glucose Monitoring Suppl (CONTOUR BLOOD GLUCOSE SYSTEM) DEVI Check Blood sugar once daily. Dx E11.9 1 Device 0  . gabapentin (NEURONTIN) 300 MG capsule TAKE 1 CAPSULE (300 MG TOTAL) BY MOUTH 3 TIMES DAILY **NEED APPOINTMENT FOR FURTHER REFILLS** 180 capsule 0  . Ibuprofen 200 MG CAPS Take by mouth. Patient takes 5-20 tablets daily.    [name] Devices (BAYER MICROLET 2 LANCING DEVIC) MISC Check Blood sugar once daily. Dx E11.9 50 each 11  . lisinopril (PRINIVIL,ZESTRIL) 5 MG  tablet Take 1 tablet (5 mg total) by mouth daily. 90 tablet 3  . metFORMIN (GLUCOPHAGE-XR) 500 MG 24 hr tablet TAKE 1 TABLET (500 MG TOTAL) BY MOUTH DAILY WITH BREAKFAST. 30 tablet 5  . omeprazole (PRILOSEC) 40 MG capsule TAKE 1 CAPSULE (40 MG TOTAL) BY MOUTH DAILY. 30 capsule 3  . oxyCODONE-acetaminophen (ROXICET) 5-325 MG tablet Take 1 tablet  by mouth every 8 (eight) hours as needed for severe pain. 90 tablet 0  . piroxicam (FELDENE) 10 MG capsule Take 1 capsule (10 mg total) by mouth daily. 30 capsule 2  . sertraline (ZOLOFT) 100 MG tablet TAKE 2 TABLETS BY MOUTH DAILY **NEED OFFICE VISIT FOR ADDITIONAL REFILLS** 60 tablet 0  . simvastatin (ZOCOR) 40 MG tablet Take 1 tablet (40 mg total) by mouth at bedtime. (Patient not taking: Reported on 09/14/2017) 30 tablet 5   No current facility-administered medications on file prior to visit.      Objective:  Objective  Physical Exam  Constitutional: He is oriented to person, place, and time. He appears well-developed and well-nourished. No distress.  HENT:  Head: Normocephalic and atraumatic.  Right Ear: External ear normal.  Left Ear: External ear normal.  Nose: Nose normal.  Mouth/Throat: Oropharynx is clear and moist. No oropharyngeal exudate.  Eyes: Pupils are equal, round, and reactive to light. Conjunctivae and EOM are normal. Right eye exhibits no discharge. Left eye exhibits no discharge.  Neck: Normal range of motion. Neck supple. No JVD present. No thyromegaly present.  Cardiovascular: Normal rate, regular rhythm and intact distal pulses.  Exam reveals no gallop and no friction rub.   No murmur heard. Pulmonary/Chest: Effort normal and breath sounds normal. No respiratory distress. He has no wheezes. He has no rales. He exhibits no tenderness.  Abdominal: Soft. Bowel sounds are normal. He exhibits no distension and no mass. There is no tenderness. There is no rebound and no guarding.  Genitourinary: Rectum normal, prostate normal and penis normal. Rectal exam shows guaiac negative stool.  Musculoskeletal: Normal range of motion. He exhibits no edema or tenderness.  Lymphadenopathy:    He has no cervical adenopathy.  Neurological: He is alert and oriented to person, place, and time. He displays normal reflexes. He exhibits normal muscle tone.  Skin: Skin is warm and dry. No  rash noted. He is not diaphoretic. No erythema. No pallor.  Psychiatric: He has a normal mood and affect. His behavior is normal. Judgment and thought content normal.  Nursing note and vitals reviewed.  BP 114/72 (BP Location: Right Arm, Patient Position: Sitting, Cuff Size: Normal)   Pulse 66   Temp (!) 97.5 F (36.4 C) (Oral)   Ht 6' (1.829 m)   Wt 224 lb 6.4 oz (101.8 kg)   SpO2 96%   BMI 30.43 kg/m  Wt Readings from Last 3 Encounters:  09/14/17 224 lb 6.4 oz (101.8 kg)  04/30/17 226 lb 12.8 oz (102.9 kg)  12/18/16 218 lb (98.9 kg)     Lab Results  Component Value Date   WBC 5.3 06/12/2016   HGB 14.7 06/12/2016   HCT 43.4 06/12/2016   PLT 145.0 (L) 06/12/2016   GLUCOSE 152 (H) 06/12/2016   CHOL 212 (H) 06/12/2016   TRIG 148.0 06/12/2016   HDL 43.10 06/12/2016   LDLDIRECT 152.0 02/26/2016   LDLCALC 139 (H) 06/12/2016   ALT 27 06/12/2016   AST 19 06/12/2016   NA 135 06/12/2016   K 4.3 06/12/2016   CL 102 06/12/2016  CREATININE 0.72 06/12/2016   BUN 19 06/12/2016   CO2 27 06/12/2016   TSH 0.97 10/25/2012   PSA 1.14 06/12/2016   HGBA1C 6.7 (H) 06/12/2016   MICROALBUR 1.2 02/26/2016    Mr Brain Wo Contrast  Result Date: 12/29/2015 CLINICAL DATA:  Increasing cognitive problems, word-finding issues, disorientation, headache, and blurred vision. Symptom duration unspecified. EXAM: MRI HEAD WITHOUT CONTRAST TECHNIQUE: Multiplanar, multiecho pulse sequences of the brain and surrounding structures were obtained without intravenous contrast. COMPARISON:  MR brain 10/04/2008. FINDINGS: No evidence for acute infarction, hemorrhage, mass lesion, hydrocephalus, or extra-axial fluid. Moderate cerebral and cerebellar atrophy. No significant white matter disease. Slight prominence perivascular spaces could represent sequelae of hypertension, but no significant white matter disease is observed. Falx ossification. Flow voids are maintained throughout the carotid, basilar, and  vertebral arteries. There are no areas of chronic hemorrhage. Pituitary, pineal, and cerebellar tonsils unremarkable. No upper cervical lesions. Visualized calvarium, skull base, and upper cervical osseous structures unremarkable. Scalp and extracranial soft tissues, orbits, sinuses, and mastoids show no acute process. Compared with 2009, slight progression of cerebral volume loss. IMPRESSION: Moderate cerebral and cerebellar atrophy. Mild progression since 2009. No significant white matter disease. No acute intracranial findings. Electronically Signed   By: Staci Righter M.D.   On: 12/29/2015 10:30     Assessment & Plan:  Plan  I am having Mr. Borquez maintain his Ibuprofen, CONTOUR BLOOD GLUCOSE SYSTEM, BAYER CONTOUR TEST, BAYER MICROLET 2 LANCING DEVIC, BAYER MICROLET LANCETS, piroxicam, omeprazole, lisinopril, simvastatin, metFORMIN, gabapentin, sertraline, and oxyCODONE-acetaminophen.  No orders of the defined types were placed in this encounter.   Problem List Items Addressed This Visit      Unprioritized   DM (diabetes mellitus) type II uncontrolled, periph vascular disorder ([place])    hgba1c to be done, minimize simple carbs. Increase exercise as tolerated. Continue current meds      Essential hypertension    Well controlled, no changes to meds. Encouraged heart healthy diet such as the DASH diet and exercise as tolerated.       Relevant Orders   Lipid panel   CBC with Differential/Platelet   PSA   Comprehensive metabolic panel   POCT Urinalysis Dipstick (Automated) (Completed)   Hyperlipidemia LDL goal <70    Tolerating statin, encouraged heart healthy diet, avoid trans fats, minimize simple carbs and saturated fats. Increase exercise as tolerated      Preventative health care - Primary    ghm utd Check labs  See avs He will get flu shot at pharmacy D/w pt shingrix      Relevant Orders   POCT Occult Blood Stool (Completed)    Other Visit Diagnoses    Hyperlipidemia  LDL goal <100       Relevant Orders   Lipid panel   CBC with Differential/Platelet   PSA   Comprehensive metabolic panel   POCT Urinalysis Dipstick (Automated) (Completed)   Controlled diabetes mellitus type 2 with complications, unspecified whether long term insulin use (HCC)       Relevant Orders   Comprehensive metabolic panel   Hemoglobin A1c      Follow-up: Return in about 6 months (around 03/14/2018) for hypertension, hyperlipidemia, diabetes II.  [name], DO

## 2017-09-14 NOTE — Assessment & Plan Note (Signed)
ghm utd Check labs  See avs He will get flu shot at pharmacy D/w pt shingrix

## 2017-09-14 NOTE — Assessment & Plan Note (Signed)
hgba1c to be done, minimize simple carbs. Increase exercise as tolerated. Continue current meds  

## 2017-09-14 NOTE — Patient Instructions (Signed)

## 2017-09-14 NOTE — Assessment & Plan Note (Signed)
Well controlled, no changes to meds. Encouraged heart healthy diet such as the DASH diet and exercise as tolerated.  °

## 2017-09-15 LAB — COMPREHENSIVE METABOLIC PANEL
ALT: 27 U/L (ref 0–53)
AST: 18 U/L (ref 0–37)
Albumin: 4.6 g/dL (ref 3.5–5.2)
Alkaline Phosphatase: 76 U/L (ref 39–117)
BUN: 15 mg/dL (ref 6–23)
CHLORIDE: 96 meq/L (ref 96–112)
CO2: 30 meq/L (ref 19–32)
Calcium: 10 mg/dL (ref 8.4–10.5)
Creatinine, Ser: 0.84 mg/dL (ref 0.40–1.50)
GFR: 98.34 mL/min (ref >60.00)
GLUCOSE: 175 mg/dL — AB (ref 70–99)
POTASSIUM: 4 meq/L (ref 3.5–5.1)
Sodium: 134 mEq/L — ABNORMAL LOW (ref 135–145)
Total Bilirubin: 0.7 mg/dL (ref 0.2–1.2)
Total Protein: 7.5 g/dL (ref 6.0–8.3)

## 2017-09-15 LAB — CBC WITH DIFFERENTIAL/PLATELET
BASOS PCT: 0.9 % (ref 0.0–3.0)
Basophils Absolute: 0.1 10*3/uL (ref 0.0–0.1)
EOS PCT: 2.3 % (ref 0.0–5.0)
Eosinophils Absolute: 0.2 10*3/uL (ref 0.0–0.7)
HCT: 46.6 % (ref 39.0–52.0)
Hemoglobin: 16 g/dL (ref 13.0–17.0)
Lymphocytes Relative: 34.4 % (ref 12.0–46.0)
Lymphs Abs: 2.3 10*3/uL (ref 0.7–4.0)
MCHC: 34.4 g/dL (ref 30.0–36.0)
MCV: 86.1 fl (ref 78.0–100.0)
MONO ABS: 0.5 10*3/uL (ref 0.1–1.0)
Monocytes Relative: 7.4 % (ref 3.0–12.0)
NEUTROS PCT: 55 % (ref 43.0–77.0)
Neutro Abs: 3.7 10*3/uL (ref 1.4–7.7)
Platelets: 167 10*3/uL (ref 150.0–400.0)
RBC: 5.41 Mil/uL (ref 4.22–5.81)
RDW: 13.5 % (ref 11.5–15.5)
WBC: 6.7 10*3/uL (ref 4.0–10.5)

## 2017-09-15 LAB — HEMOGLOBIN A1C: Hgb A1c MFr Bld: 8 % — ABNORMAL HIGH (ref 4.6–6.5)

## 2017-09-15 LAB — LIPID PANEL
Cholesterol: 233 mg/dL — ABNORMAL HIGH (ref 0–200)
HDL: 45.3 mg/dL (ref >39.00)
NONHDL: 187.46
TRIGLYCERIDES: 312 mg/dL — AB (ref 0.0–149.0)
Total CHOL/HDL Ratio: 5
VLDL: 62.4 mg/dL — ABNORMAL HIGH (ref 0.0–40.0)

## 2017-09-15 LAB — LDL CHOLESTEROL, DIRECT: Direct LDL: 139 mg/dL

## 2017-09-15 LAB — PSA: PSA: 1.32 ng/mL (ref 0.10–4.00)

## 2017-09-17 ENCOUNTER — Other Ambulatory Visit: Payer: Self-pay | Admitting: Family Medicine

## 2017-09-17 DIAGNOSIS — E785 Hyperlipidemia, unspecified: Secondary | ICD-10-CM

## 2017-09-17 DIAGNOSIS — E119 Type 2 diabetes mellitus without complications: Secondary | ICD-10-CM

## 2017-09-17 MED ORDER — METFORMIN HCL ER 500 MG PO TB24: 1000.0000 mg | ORAL_TABLET | Freq: Every day | ORAL | 1 refills | Status: DC

## 2017-09-17 MED ORDER — ATORVASTATIN CALCIUM 10 MG PO TABS: ORAL_TABLET | ORAL | 3 refills | Status: DC

## 2017-09-17 MED FILL — METFORMIN HCL ER 500 MG TAB: 500 | 90 days supply | Qty: 180 | Fill #0

## 2017-09-17 MED FILL — ATORVASTATIN 10 MG TABLET: 10 | 28 days supply | Qty: 12 | Fill #0

## 2017-10-26 ENCOUNTER — Other Ambulatory Visit: Payer: Self-pay | Admitting: Family Medicine

## 2017-10-26 DIAGNOSIS — G8929 Other chronic pain: Secondary | ICD-10-CM

## 2017-10-26 NOTE — Telephone Encounter (Signed)
Patient wants a refill of OXYCODONE.Marland Kitchen

## 2017-10-28 NOTE — Telephone Encounter (Signed)
Pt is following up on the status of the rx, states he is 2 weeks behind on taking the meds/

## 2017-10-29 MED ORDER — OXYCODONE-ACETAMINOPHEN 5-325 MG PO TABS: 1.0000 | ORAL_TABLET | Freq: Three times a day (TID) | ORAL | 0 refills | Status: DC | PRN

## 2017-10-29 NOTE — Telephone Encounter (Signed)
Requesting:Oxycodone Contract:yes UDS:low risk next scrn 02/11/18 Last OV:09/14/17 Next OV:12/17/17 Last Refill:08/11/17    #[id number]   Please advise

## 2017-10-29 NOTE — Telephone Encounter (Signed)
Need database 

## 2017-10-30 NOTE — Telephone Encounter (Signed)
Database is on your desk for review

## 2017-10-30 NOTE — Telephone Encounter (Signed)
Refill x1 

## 2017-10-30 NOTE — Telephone Encounter (Signed)
Done.  At front desk for pickup.  Patient notified.

## 2017-11-06 MED FILL — OXYCODONE-ACETAMINOPHEN 5-3: 5-325 | 30 days supply | Qty: 90 | Fill #0

## 2017-11-24 ENCOUNTER — Other Ambulatory Visit: Payer: Self-pay | Admitting: Gastroenterology

## 2017-11-24 ENCOUNTER — Other Ambulatory Visit: Payer: Self-pay | Admitting: Family Medicine

## 2017-11-24 DIAGNOSIS — M2550 Pain in unspecified joint: Secondary | ICD-10-CM

## 2017-11-24 DIAGNOSIS — G8929 Other chronic pain: Secondary | ICD-10-CM

## 2017-11-24 DIAGNOSIS — I10 Essential (primary) hypertension: Secondary | ICD-10-CM

## 2017-11-24 NOTE — Telephone Encounter (Signed)
Ok to refill Omeprazole?  Pt has not been seen since 2016 and does not have an upcoming appt? Thanks

## 2017-11-24 NOTE — Telephone Encounter (Signed)
Yes that's okay. He should follow up within a year if possible. Thanks

## 2017-11-25 MED FILL — GABAPENTIN 300 MG CAPSULE: 300 | 60 days supply | Qty: 180 | Fill #0

## 2017-11-25 MED FILL — LISINOPRIL 5 MG TAB: 5 | 30 days supply | Qty: 30 | Fill #0

## 2017-11-25 NOTE — Telephone Encounter (Signed)
Called pt and left message for pt to call back to schedule an OV.

## 2017-11-26 NOTE — Telephone Encounter (Signed)
L/M for patient to call back and schedule an appt.

## 2017-12-17 ENCOUNTER — Other Ambulatory Visit (INDEPENDENT_AMBULATORY_CARE_PROVIDER_SITE_OTHER): Payer: Self-pay

## 2017-12-17 DIAGNOSIS — E119 Type 2 diabetes mellitus without complications: Secondary | ICD-10-CM

## 2017-12-17 DIAGNOSIS — E785 Hyperlipidemia, unspecified: Secondary | ICD-10-CM

## 2017-12-17 LAB — COMPREHENSIVE METABOLIC PANEL
ALBUMIN: 4.3 g/dL (ref 3.5–5.2)
ALK PHOS: 98 U/L (ref 39–117)
ALT: 22 U/L (ref 0–53)
AST: 15 U/L (ref 0–37)
BUN: 14 mg/dL (ref 6–23)
CALCIUM: 9.3 mg/dL (ref 8.4–10.5)
CO2: 28 mEq/L (ref 19–32)
Chloride: 102 mEq/L (ref 96–112)
Creatinine, Ser: 0.86 mg/dL (ref 0.40–1.50)
GFR: 95.63 mL/min (ref >60.00)
GLUCOSE: 211 mg/dL — AB (ref 70–99)
POTASSIUM: 4 meq/L (ref 3.5–5.1)
Sodium: 137 mEq/L (ref 135–145)
Total Bilirubin: 0.5 mg/dL (ref 0.2–1.2)
Total Protein: 7.4 g/dL (ref 6.0–8.3)

## 2017-12-17 LAB — LIPID PANEL
CHOLESTEROL: 196 mg/dL (ref 0–200)
HDL: 32.5 mg/dL — AB (ref >39.00)
NonHDL: 163.01
TRIGLYCERIDES: 253 mg/dL — AB (ref 0.0–149.0)
Total CHOL/HDL Ratio: 6
VLDL: 50.6 mg/dL — AB (ref 0.0–40.0)

## 2017-12-17 LAB — LDL CHOLESTEROL, DIRECT: Direct LDL: 135 mg/dL

## 2017-12-17 LAB — HEMOGLOBIN A1C: HEMOGLOBIN A1C: 8.4 % — AB (ref 4.6–6.5)

## 2017-12-17 NOTE — Addendum Note (Signed)
Addended by: Caffie Pinto on: 12/17/2017 09:23 AM   Modules accepted: Orders

## 2017-12-18 ENCOUNTER — Other Ambulatory Visit: Payer: Self-pay

## 2017-12-18 MED ORDER — SITAGLIPTIN PHOSPHATE 100 MG PO TABS: 100.0000 mg | ORAL_TABLET | Freq: Every day | ORAL | 2 refills | Status: DC

## 2017-12-18 MED ORDER — ATORVASTATIN CALCIUM 20 MG PO TABS: 20.0000 mg | ORAL_TABLET | Freq: Every day | ORAL | 3 refills | Status: DC

## 2018-01-04 ENCOUNTER — Other Ambulatory Visit: Payer: Self-pay | Admitting: Family Medicine

## 2018-01-04 DIAGNOSIS — I10 Essential (primary) hypertension: Secondary | ICD-10-CM

## 2018-01-04 MED FILL — LISINOPRIL 5 MG TAB: 5 | 30 days supply | Qty: 30 | Fill #0

## 2018-01-24 ENCOUNTER — Other Ambulatory Visit: Payer: Self-pay | Admitting: Family Medicine

## 2018-01-24 DIAGNOSIS — I10 Essential (primary) hypertension: Secondary | ICD-10-CM

## 2018-01-24 DIAGNOSIS — F329 Major depressive disorder, single episode, unspecified: Secondary | ICD-10-CM

## 2018-01-24 DIAGNOSIS — M2550 Pain in unspecified joint: Secondary | ICD-10-CM

## 2018-01-24 DIAGNOSIS — G8929 Other chronic pain: Secondary | ICD-10-CM

## 2018-01-24 DIAGNOSIS — F32A Depression, unspecified: Secondary | ICD-10-CM

## 2018-01-25 ENCOUNTER — Other Ambulatory Visit: Payer: Self-pay | Admitting: Family Medicine

## 2018-01-25 DIAGNOSIS — G8929 Other chronic pain: Secondary | ICD-10-CM

## 2018-01-25 MED ORDER — SERTRALINE HCL 100 MG PO TABS: 100.0000 mg | ORAL_TABLET | Freq: Every day | ORAL | 0 refills | Status: DC

## 2018-01-25 MED ORDER — LISINOPRIL 5 MG PO TABS: 5.0000 mg | ORAL_TABLET | Freq: Every day | ORAL | 1 refills | Status: DC

## 2018-01-25 MED ORDER — OXYCODONE-ACETAMINOPHEN 5-325 MG PO TABS: 1.0000 | ORAL_TABLET | Freq: Three times a day (TID) | ORAL | 0 refills | Status: DC | PRN

## 2018-01-25 MED ORDER — GABAPENTIN 300 MG PO CAPS: 300.0000 mg | ORAL_CAPSULE | Freq: Three times a day (TID) | ORAL | 0 refills | Status: DC

## 2018-01-25 MED ORDER — BAYER CONTOUR MONITOR DEVI: 0 refills | Status: DC

## 2018-01-25 NOTE — Telephone Encounter (Signed)
Requesting: oxycodone Contract: 08/03/17 UDS: 08/11/17 low risk Last Visit: 09/14/17 Next Visit: none Last Refill: 10/29/17  Please Advise

## 2018-02-02 MED FILL — GABAPENTIN 300 MG CAPSULE: 300 | 60 days supply | Qty: 180 | Fill #0

## 2018-02-02 MED FILL — CONTOUR NEXT METER: W/DEVICE | 30 days supply | Qty: 1 | Fill #0

## 2018-02-02 MED FILL — SERTRALINE HCL 100 MG TAB: 100 | 60 days supply | Qty: 60 | Fill #0

## 2018-02-02 MED FILL — LISINOPRIL 5 MG TAB: 5 | 30 days supply | Qty: 30 | Fill #0

## 2018-02-09 ENCOUNTER — Telehealth: Payer: Self-pay

## 2018-02-09 NOTE — Telephone Encounter (Signed)
PA initiated via Covermymeds; KEY: [id number]. Awaiting determination.

## 2018-02-09 NOTE — Telephone Encounter (Signed)
PA approved. Effective from 02/09/2018 through 03/10/2018.

## 2018-04-07 MED FILL — LISINOPRIL 5 MG TABLET: 5 | 30 days supply | Qty: 30 | Fill #1

## 2018-04-30 ENCOUNTER — Other Ambulatory Visit: Payer: Self-pay | Admitting: Family Medicine

## 2018-04-30 DIAGNOSIS — G8929 Other chronic pain: Secondary | ICD-10-CM

## 2018-04-30 DIAGNOSIS — M2550 Pain in unspecified joint: Secondary | ICD-10-CM

## 2018-04-30 MED FILL — GABAPENTIN 300 MG CAPSULE: 300 | 60 days supply | Qty: 180 | Fill #0

## 2018-05-03 ENCOUNTER — Other Ambulatory Visit: Payer: Self-pay | Admitting: Family Medicine

## 2018-05-03 DIAGNOSIS — G8929 Other chronic pain: Secondary | ICD-10-CM

## 2018-05-03 NOTE — Telephone Encounter (Signed)
Copied from [place]. Topic: Quick Communication - Rx Refill/Question >> May 03, 2018  1:57 PM Scherrie Gerlach wrote: Medication: oxyCODONE-acetaminophen (ROXICET) 5-325 MG tablet [facility], [address] [phone] (Phone) [phone] (Fax)

## 2018-05-04 MED ORDER — OXYCODONE-ACETAMINOPHEN 5-325 MG PO TABS: 1.0000 | ORAL_TABLET | Freq: Three times a day (TID) | ORAL | 0 refills | Status: DC | PRN

## 2018-05-04 NOTE — Telephone Encounter (Signed)
Requesting:Oxycodone-acetaminophen Contract:08/03/17 UDS:08/11/17 due for UDS Last Visit:09/14/17 Next Visit:none with pcp Last Refill:01/25/18  Database ran and printed on desk for review Please Advise

## 2018-05-06 NOTE — Telephone Encounter (Signed)
Patient notified that he needs uds before picking up rx.  Advised that he needs to make an appointment to follow up pain.  We have to see him every 3 months.

## 2018-05-07 ENCOUNTER — Other Ambulatory Visit: Payer: Self-pay | Admitting: *Deleted

## 2018-05-07 ENCOUNTER — Other Ambulatory Visit (INDEPENDENT_AMBULATORY_CARE_PROVIDER_SITE_OTHER): Payer: Self-pay

## 2018-05-07 DIAGNOSIS — M542 Cervicalgia: Secondary | ICD-10-CM

## 2018-05-07 DIAGNOSIS — Z79899 Other long term (current) drug therapy: Secondary | ICD-10-CM

## 2018-05-07 MED FILL — OXYCODONE-ACETAMINOPHEN 5-3: 5-325 | 30 days supply | Qty: 90 | Fill #0

## 2018-05-07 NOTE — Addendum Note (Signed)
Addended by: Harl Bowie on: 05/07/2018 09:18 AM   Modules accepted: Orders

## 2018-05-10 LAB — PAIN MGMT, PROFILE 8 W/CONF, U
6 ACETYLMORPHINE: NEGATIVE ng/mL (ref <10)
Alcohol Metabolites: POSITIVE ng/mL — AB (ref <500)
Amphetamines: NEGATIVE ng/mL (ref <500)
BENZODIAZEPINES: NEGATIVE ng/mL (ref <100)
BUPRENORPHINE, URINE: NEGATIVE ng/mL (ref <5)
COCAINE METABOLITE: NEGATIVE ng/mL (ref <150)
Creatinine: 62.1 mg/dL
Ethyl Glucuronide (ETG): 146233 ng/mL — ABNORMAL HIGH (ref <500)
Ethyl Sulfate (ETS): 23223 ng/mL — ABNORMAL HIGH (ref <100)
MDMA: NEGATIVE ng/mL (ref <500)
Marijuana Metabolite: NEGATIVE ng/mL (ref <20)
NOROXYCODONE: 569 ng/mL — AB (ref <50)
OXIDANT: NEGATIVE ug/mL (ref <200)
OXYCODONE: 317 ng/mL — AB (ref <50)
OXYCODONE: POSITIVE ng/mL — AB (ref <100)
Opiates: NEGATIVE ng/mL (ref <100)
Oxymorphone: 77 ng/mL — ABNORMAL HIGH (ref <50)
pH: 6.07 (ref 4.5–9.0)

## 2018-06-17 ENCOUNTER — Encounter: Payer: Self-pay | Admitting: Family Medicine

## 2018-06-17 ENCOUNTER — Ambulatory Visit (INDEPENDENT_AMBULATORY_CARE_PROVIDER_SITE_OTHER): Payer: Self-pay | Admitting: Family Medicine

## 2018-06-17 DIAGNOSIS — D489 Neoplasm of uncertain behavior, unspecified: Secondary | ICD-10-CM

## 2018-06-17 NOTE — Patient Instructions (Signed)
Do not shower for the rest of the day. When you do wash it, use only soap and water. Do not vigorously scrub. Apply triple antibiotic ointment (like Neosporin) twice daily. Keep the area clean and dry.   Things to look out for: increasing pain not relieved by ibuprofen/acetaminophen, fevers, spreading redness, drainage of pus, or foul odor.  Give us 1 week to get the results of your biopsy back.  Let us know if you need anything. 

## 2018-06-17 NOTE — Progress Notes (Signed)
CC: skin complaint  Ronald Petersen is a 62 y.o. male here for a skin complaint.  Started small, increased in size over the past several months. Here for removal.   ROS:  Skin: As noted in HPI  Past Medical History:  Diagnosis Date  . Anxiety   . Arthritis   . Depression   . Diabetes mellitus   . GERD (gastroesophageal reflux disease)   . Hyperlipidemia   . Hypertension   . Neuromuscular disorder ([place])    NEUROPATHY  . Sleep apnea    Gen: awake, alert, appearing stated age Lungs: No accessory muscle use Skin: See below. No drainage, erythema, TTP, fluctuance, excoriation Psych: Age appropriate judgment and insight   RUE  Procedure note; shave biopsy Informed consent was obtained. The area was cleaned with alcohol and injected with 1 mL of 1% lidocaine with epinephrine. A Dermablade was slightly bent and used to cut under the area of interest. The specimen was placed in a sterile specimen cup and sent to the lab. The area was then cauterized ensuring adequate hemostasis. The area was dressed with triple antibiotic ointment and a bandage. After I left the room, he started to bleed again.  I reentered and anesthetized him with another 0.8 mL of 1% lidocaine with epinephrine.  I did not thoroughly cauterize the area ensuring adequate hemostasis. There were otherwise no complications noted.  The patient tolerated the procedure well.   Neoplasm of uncertain behavior - Plan: PR SHAV SKIN LES 0.6-1.0 CM TRUNK,ARM,LEG, Dermatology pathology  Orders as above. Aftercare instructions given. F/u prn. The patient voiced understanding and agreement to the plan.  [place], DO 06/17/18 4:51 PM

## 2018-06-28 ENCOUNTER — Encounter: Payer: Self-pay | Admitting: Family Medicine

## 2018-06-28 ENCOUNTER — Telehealth: Payer: Self-pay | Admitting: *Deleted

## 2018-06-28 ENCOUNTER — Ambulatory Visit (INDEPENDENT_AMBULATORY_CARE_PROVIDER_SITE_OTHER): Payer: Medicaid Other | Admitting: Family Medicine

## 2018-06-28 DIAGNOSIS — D0461 Carcinoma in situ of skin of right upper limb, including shoulder: Secondary | ICD-10-CM

## 2018-06-28 NOTE — Telephone Encounter (Signed)
OK to refill

## 2018-06-28 NOTE — Telephone Encounter (Signed)
Patient came in office requesting refill for oxycodone  Database ran on 05/04/18 and is in media for review   Last written: 05/04/18 Last ov: 09/14/17 Next ov:  08/03/18 Contract: 08/02/19 UDS: 11/07/18

## 2018-06-28 NOTE — Progress Notes (Signed)
Chief Complaint  Patient presents with  . Procedure   Pt here for SCC excision. No questions.  Gen- awake, alert Skin- well healed area from shave biopsy last week Psych- age appropriate judgment and insight  Procedure note; excisional biopsy Informed consent was obtained. 0.9 cm +0.5 cm margins on each side = 1.9 cm lesion for excision. The area was cleaned with alcohol and injected with 3 mL of 1% lidocaine with epinephrine. An elliptical track around the area of interest was demarcated. A sterile field was then created with a fenestrated drape. Sterile gloves used. The area was then cleaned with Hibiclens. A 15 blade scalpel was then used to follow the elliptical track as demarcated. Forceps were used to create traction to enhance dissection of the tissue planes and removal of the specimen. The specimen was placed a sterile specimen cup and sent to the lab. 6 Simple 4-0 nylon sutures were placed with good approximation of wound edges. The area was dressed with triple antibiotic ointment and a bandage. There were no complications noted. The patient tolerated the procedure well.  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 After care instructions verbalized and written down. Await final path.  F/u in 2 weeks for suture removal.  Pt voiced understanding and agreement to the plan.  Ronald Petersen [place] 5:10 PM 06/28/18

## 2018-06-28 NOTE — Addendum Note (Signed)
Addended by: Sharon Seller B on: 06/28/2018 05:34 PM   Modules accepted: Orders

## 2018-06-28 NOTE — Patient Instructions (Addendum)
Do not shower for the rest of the day. When you do wash it, use only soap and water. Do not vigorously scrub. Apply triple antibiotic ointment (like Neosporin) twice daily. Keep the area clean and dry.   Things to look out for: increasing pain not relieved by ibuprofen/acetaminophen, fevers, spreading redness, drainage of pus, or foul odor.  1 business week to get results of pathology back.   Let us know if you need anything.

## 2018-06-29 ENCOUNTER — Other Ambulatory Visit: Payer: Self-pay | Admitting: Family Medicine

## 2018-06-29 DIAGNOSIS — G8929 Other chronic pain: Secondary | ICD-10-CM

## 2018-06-29 MED ORDER — OXYCODONE-ACETAMINOPHEN 5-325 MG PO TABS: 1.0000 | ORAL_TABLET | Freq: Three times a day (TID) | ORAL | 0 refills | Status: DC | PRN

## 2018-06-29 NOTE — Telephone Encounter (Signed)
Patient notified

## 2018-06-29 NOTE — Telephone Encounter (Signed)
Can you send in electronically will get uds at 07/12/18 appt

## 2018-06-29 NOTE — Telephone Encounter (Signed)
Sent to [facility].

## 2018-07-02 ENCOUNTER — Other Ambulatory Visit: Payer: Self-pay | Admitting: Family Medicine

## 2018-07-02 DIAGNOSIS — G8929 Other chronic pain: Secondary | ICD-10-CM

## 2018-07-02 DIAGNOSIS — F32A Depression, unspecified: Secondary | ICD-10-CM

## 2018-07-02 DIAGNOSIS — F329 Major depressive disorder, single episode, unspecified: Secondary | ICD-10-CM

## 2018-07-02 DIAGNOSIS — M2550 Pain in unspecified joint: Secondary | ICD-10-CM

## 2018-07-02 DIAGNOSIS — I10 Essential (primary) hypertension: Secondary | ICD-10-CM

## 2018-07-02 MED FILL — SERTRALINE HCL 100 MG TAB: 100 | 90 days supply | Qty: 90 | Fill #0

## 2018-07-02 MED FILL — METFORMIN HCL ER 500 MG TAB: 500 | 90 days supply | Qty: 180 | Fill #1

## 2018-07-02 MED FILL — LISINOPRIL 5 MG TABLET: 5 | 90 days supply | Qty: 90 | Fill #0

## 2018-07-02 MED FILL — GABAPENTIN 300 MG CAPSULE: 300 | 60 days supply | Qty: 180 | Fill #0

## 2018-07-07 ENCOUNTER — Telehealth: Payer: Self-pay | Admitting: Family Medicine

## 2018-07-07 NOTE — Telephone Encounter (Signed)
Copied from [place] [id number]. Topic: Quick Communication - See Telephone Encounter >> Jul 07, 2018  3:18 PM Rosalin Hawking wrote: CRM for notification. See Telephone encounter for: 07/07/18.   Pt stated that he picked up his meds at pharmacy and wants to inform provider that he would like for now on to only have [facility] as is pharmacy. Pt is going to pick up his rx for oxyCODONE-acetaminophen (ROXICET) 5-325 MG tablet at [facility] but would like for his next rx request for oxycodone and the others to be sent to [facility] (pt states it is cheaper now at [facility]). Please update Pharmacy on pt's chart.

## 2018-07-08 ENCOUNTER — Ambulatory Visit: Payer: Self-pay | Admitting: Family Medicine

## 2018-07-08 ENCOUNTER — Ambulatory Visit (INDEPENDENT_AMBULATORY_CARE_PROVIDER_SITE_OTHER): Payer: Self-pay | Admitting: Medical

## 2018-07-08 ENCOUNTER — Encounter: Payer: Self-pay | Admitting: Medical

## 2018-07-08 VITALS — BP 132/89 | HR 70 | Temp 98.3°F | Resp 16 | Ht 72.0 in | Wt 223.0 lb

## 2018-07-08 DIAGNOSIS — T148XXA Other injury of unspecified body region, initial encounter: Secondary | ICD-10-CM

## 2018-07-08 DIAGNOSIS — L089 Local infection of the skin and subcutaneous tissue, unspecified: Secondary | ICD-10-CM

## 2018-07-08 MED ORDER — CEFTRIAXONE SODIUM 1 G IJ SOLR
1.0000 g | Freq: Once | INTRAMUSCULAR | Status: AC
  Administered 2018-07-08: 1 g via INTRAMUSCULAR

## 2018-07-08 MED ORDER — DOXYCYCLINE HYCLATE 100 MG PO TABS: 100.0000 mg | ORAL_TABLET | Freq: Two times a day (BID) | ORAL | 0 refills | Status: DC

## 2018-07-08 MED FILL — DOXYCYCLINE HYCLATE 100 MG: 100 | 10 days supply | Qty: 20 | Fill #0

## 2018-07-08 NOTE — Telephone Encounter (Signed)
Pt had squamous cell removal 06/28/18, right arm, inner aspect near elbow. Reports large amount clear drainage from incision,"oozing", onset this AM; sutures intact. States 8/10 pain, radiates to under arm. Denies fever, states no swelling  "But difficult to see area." Reports mild warmth, no redness. Same day appt made with E. Saguier at 1340.  Care advise given per protocol.  Reason for Disposition . [1] INCREASING pain in incision AND [2] > 2 days (48 hours) since surgery  Answer Assessment - Initial Assessment Questions 1. SYMPTOM: "What's the main symptom you're concerned about?" (e.g., redness, pain, drainage)     Pain and drainage 2. ONSET: "When did   start?"    This am 3. SURGERY: "What surgery was performed?"     Squamous cell removal rt arm near elbow, inner aspect 4. DATE of SURGERY: "When was surgery performed?"      06/28/18 5. INCISION SITE: "Where is the incision located?"      Near elbow,right  arm 6. REDNESS: "Is there any redness at the incision site?" If yes, ask: "How wide across is the redness?" (Inches, centimeters)      No, mild warmth 7. PAIN: "Is there any pain?" If so, ask: "How bad is it?"  (Scale 1-10; or mild, moderate, severe)    8/10, radiates under arm     8. BLEEDING: "Is there any bleeding?" If so, ask: "How much?" and "Where?"     no 9. DRAINAGE: "Is there any drainage from the incision site?" If yes, ask: "What color and how much?" (e.g., red, cloudy, pus; drops, teaspoon)     Clear, large amounts oozing 10. FEVER: "Do you have a fever?" If so, ask: "What is your temperature, how was it measured, and when did it start?"       no 11. OTHER SYMPTOMS: "Do you have any other symptoms?" (e.g., shaking chills, weakness, rash elsewhere on body)       no  Protocols used: POST-OP INCISION [facility]

## 2018-07-08 NOTE — Addendum Note (Signed)
Addended by: Hinton Dyer on: 07/08/2018 02:32 PM   Modules accepted: Orders

## 2018-07-08 NOTE — Addendum Note (Signed)
Addended by: Anabel Halon on: 07/08/2018 02:36 PM   Modules accepted: Orders

## 2018-07-08 NOTE — Telephone Encounter (Signed)
Routed to Dr. [name] as Juluis Rainier. Pt has appointment with Dr. Nani Ravens 7/15 as well.

## 2018-07-08 NOTE — Telephone Encounter (Signed)
Per patient request,removed all but [place] in patients chart.

## 2018-07-08 NOTE — Addendum Note (Signed)
Addended by: Hinton Dyer on: 07/08/2018 03:02 PM   Modules accepted: Orders

## 2018-07-08 NOTE — Patient Instructions (Signed)
  You do appear to have infected wound.  You were told to remove sutures in 2 weeks.  On inspection of the wound I do not think that the wound is completely healed presently.  Consider removing sutures today but concerned that you have had recent skin infection and if sutures removed early the wound might pen up (dehiscence).  Did get wound culture today.  We gave Rocephin 1 g injection today.  Also prescribed doxycycline antibiotic.  Rx advisement given.  Please get CBC in our lab.  We will follow wound culture and let you know the results when those are in.  Inform you of blood work as well.  Follow-up this coming Monday with Dr. Nani Ravens for suture removal.  If you have worsening redness that spreads as discussed despite injection antibiotic and oral antibiotic then would recommend ED evaluation for probable IV antibiotics.

## 2018-07-08 NOTE — Progress Notes (Signed)
Subjective:    Patient ID: Ronald Petersen, male    DOB: February 27, 1956, 62 y.o.   MRN: [id number]  HPI   Pt states 2 weeks ago got rt upper ext skin lesion removed. Was doing well but then this morning he had pain over wound area, mild clear drainage and some redness. Some throbbing to wound area.   No fever, no chills or sweats.   Review of Systems  Constitutional: Negative for chills, fatigue and fever.  Respiratory: Negative for cough, chest tightness, shortness of breath and wheezing.   Cardiovascular: Negative for chest pain and palpitations.  Gastrointestinal: Negative for abdominal pain.  Musculoskeletal:       See upper arm.  Skin:       See hpi and physical exam.  Psychiatric/Behavioral: Negative for behavioral problems and confusion.    Past Medical History:  Diagnosis Date  . Anxiety   . Arthritis   . Depression   . Diabetes mellitus   . GERD (gastroesophageal reflux disease)   . Hyperlipidemia   . Hypertension   . Neuromuscular disorder ([place])    NEUROPATHY  . Sleep apnea      Social History   Socioeconomic History  . Marital status: Married    Spouse name: Lorriane Shire  . Number of children: 2  . Years of education: Not on file  . Highest education level: Not on file  Occupational History  . Occupation: self employed    Fish farm manager: UNEMPLOYED  Social Needs  . Financial resource strain: Not on file  . Food insecurity:    Worry: Not on file    Inability: Not on file  . Transportation needs:    Medical: Not on file    Non-medical: Not on file  Tobacco Use  . Smoking status: Former Smoker    Years: 16.00    Types: Cigarettes    Last attempt to quit: 12/29/1985    Years since quitting: 32.5  . Smokeless tobacco: Never Used  Substance and Sexual Activity  . Alcohol use: Yes    Alcohol/week: 2.4 - 3.0 oz    Types: 4 - 5 Cans of beer per week    Comment: daily - 1-2 beers  . Drug use: No  . Sexual activity: Yes    Partners: Female  Lifestyle  .  Physical activity:    Days per week: Not on file    Minutes per session: Not on file  . Stress: Not on file  Relationships  . Social connections:    Talks on phone: Not on file    Gets together: Not on file    Attends religious service: Not on file    Active member of club or organization: Not on file    Attends meetings of clubs or organizations: Not on file    Relationship status: Not on file  . Intimate partner violence:    Fear of current or ex partner: Not on file    Emotionally abused: Not on file    Physically abused: Not on file    Forced sexual activity: Not on file  Other Topics Concern  . Not on file  Social History Narrative   Exercise-- 3 days   Pt has hs degree    Past Surgical History:  Procedure Laterality Date  . APPENDECTOMY    . CHOLECYSTECTOMY    . LUMBAR LAMINECTOMY      Family History  Problem Relation Age of Onset  . Alcohol abuse Unknown   . Depression  Unknown   . Arthritis Unknown   . Hypertension Unknown   . Ovarian cancer Sister   . Stomach cancer Sister   . Heart disease Sister        MI  . Stomach cancer Maternal Grandmother   . COPD Mother   . Stroke Father   . Coronary artery disease Unknown   . Heart disease Sister        MI  . Ovarian cancer Unknown        neice  . Ovarian cancer Unknown        neice  . Colon cancer Unknown     Allergies  Allergen Reactions  . Niacin     REACTION: rash    Current Outpatient Medications on File Prior to Visit  Medication Sig Dispense Refill  . atorvastatin (LIPITOR) 20 MG tablet Take 1 tablet (20 mg total) by mouth daily. 90 tablet 3  . BAYER CONTOUR TEST test strip Check Blood sugar once daily. Dx E11.9 50 each 12  . BAYER MICROLET LANCETS lancets Check Blood sugar once daily. Dx E11.9 50 each 11  . Blood Glucose Monitoring Suppl (CONTOUR BLOOD GLUCOSE SYSTEM) DEVI Check Blood sugar once daily. Dx E11.9 1 Device 0  . gabapentin (NEURONTIN) 300 MG capsule TAKE 1 CAPSULE (300 MG TOTAL)  BY MOUTH 3 (THREE) TIMES DAILY. 180 capsule 0  . Ibuprofen 200 MG CAPS Take by mouth. Patient takes 5-20 tablets daily.    [name] Devices (BAYER MICROLET 2 LANCING DEVIC) MISC Check Blood sugar once daily. Dx E11.9 50 each 11  . lisinopril (PRINIVIL,ZESTRIL) 5 MG tablet TAKE 1 TABLET (5 MG TOTAL) BY MOUTH DAILY. 90 tablet 0  . metFORMIN (GLUCOPHAGE-XR) 500 MG 24 hr tablet Take 2 tablets (1,000 mg total) by mouth daily. 180 tablet 1  . omeprazole (PRILOSEC) 40 MG capsule TAKE 1 CAPSULE (40 MG TOTAL) BY MOUTH DAILY. 30 capsule 3  . oxyCODONE-acetaminophen (ROXICET) 5-325 MG tablet Take 1 tablet by mouth every 8 (eight) hours as needed for severe pain. 90 tablet 0  . piroxicam (FELDENE) 10 MG capsule Take 1 capsule (10 mg total) by mouth daily. 30 capsule 2  . sertraline (ZOLOFT) 100 MG tablet TAKE 1 TABLET (100 MG TOTAL) BY MOUTH DAILY. 180 tablet 0  . simvastatin (ZOCOR) 40 MG tablet Take 1 tablet (40 mg total) by mouth at bedtime. 30 tablet 5  . sitaGLIPtin (JANUVIA) 100 MG tablet Take 1 tablet (100 mg total) by mouth daily. 30 tablet 2   No current facility-administered medications on file prior to visit.     BP 132/89   Pulse 70   Temp 98.3 F (36.8 C) (Oral)   Resp 16   Ht 6' (1.829 m)   Wt 223 lb (101.2 kg)   SpO2 99%   BMI 30.24 kg/m      Objective:   Physical Exam   General- No acute distress. Pleasant patient. Neck- Full range of motion, no jvd Lungs- Clear, even and unlabored. Heart- regular rate and rhythm. Neurologic- CNII- XII grossly intact.  Rt upper ext-medial bicep region he has approximately 1.5 to 2 cm this line of sutures.  The middle portion of his wound does not look completely healed.  The edges look better healed.  He has some surrounding induration mild redness and faint pain.  The area of faint redness is about 5 cm x 4 cm.  No palpable lymph nodes in medial upper extremity or axillary area.  In the center  of the wound moist serosanguineous scant  discharge.    Assessment & Plan:  You do appear to have infected wound.  You were told to remove sutures in 2 weeks.  On inspection of the wound I do not think that the wound is completely healed presently.  Consider removing sutures today but concerned that you have had recent skin infection and if sutures removed early the wound might pen up (dehiscence).  Did get wound culture today.  We gave Rocephin 1 g injection today.  Also prescribed doxycycline antibiotic.  Rx advisement given.  Please get CBC in our lab.  We will follow wound culture and let you know the results when those are in.  Inform you of blood work as well.  Follow-up this coming Monday with Dr. Nani Ravens for suture removal.  If you have worsening redness that spreads as discussed despite injection antibiotic and oral antibiotic then would recommend ED evaluation for probable IV antibiotics.  Counseled patient today that his pain is likely related  Due to the wound infection.  And that pain might decrease as antibiotics take full effect.  Continue his oxycodone and gabapentin as currently prescribed.   Mackie Pai, PA-C

## 2018-07-09 ENCOUNTER — Encounter: Payer: Self-pay | Admitting: Family Medicine

## 2018-07-09 ENCOUNTER — Ambulatory Visit (INDEPENDENT_AMBULATORY_CARE_PROVIDER_SITE_OTHER): Payer: Medicaid Other | Admitting: Family Medicine

## 2018-07-09 ENCOUNTER — Ambulatory Visit: Payer: Self-pay

## 2018-07-09 VITALS — BP 122/72 | HR 70 | Temp 98.1°F | Ht 72.0 in | Wt 225.4 lb

## 2018-07-09 DIAGNOSIS — L089 Local infection of the skin and subcutaneous tissue, unspecified: Secondary | ICD-10-CM

## 2018-07-09 LAB — CBC WITH DIFFERENTIAL/PLATELET
BASOS PCT: 0.8 % (ref 0.0–3.0)
Basophils Absolute: 0.1 10*3/uL (ref 0.0–0.1)
EOS PCT: 1.9 % (ref 0.0–5.0)
Eosinophils Absolute: 0.1 10*3/uL (ref 0.0–0.7)
HEMATOCRIT: 44.5 % (ref 39.0–52.0)
Hemoglobin: 15.5 g/dL (ref 13.0–17.0)
LYMPHS ABS: 1.9 10*3/uL (ref 0.7–4.0)
LYMPHS PCT: 24.7 % (ref 12.0–46.0)
MCHC: 34.8 g/dL (ref 30.0–36.0)
MCV: 86 fl (ref 78.0–100.0)
MONOS PCT: 6.2 % (ref 3.0–12.0)
Monocytes Absolute: 0.5 10*3/uL (ref 0.1–1.0)
NEUTROS ABS: 5.1 10*3/uL (ref 1.4–7.7)
NEUTROS PCT: 66.4 % (ref 43.0–77.0)
PLATELETS: 156 10*3/uL (ref 150.0–400.0)
RBC: 5.17 Mil/uL (ref 4.22–5.81)
RDW: 12.9 % (ref 11.5–15.5)
WBC: 7.6 10*3/uL (ref 4.0–10.5)

## 2018-07-09 NOTE — Telephone Encounter (Signed)
Telephone call from patient stating he was seen in office yesterday . Reports incisional site has gotten worse.  Notes swelling with drainage of yellow pus.  Pain is rated 9 to 10 Yesterday it was 7 to 8.  Continuing to take pain medications that are not helping.  Request to be seen today for evaluation.  Denies fever.                                Reason for Disposition . [1] Fever AND [2] bright red area or streak  Answer Assessment - Initial Assessment Questions 1. APPEARANCE of INJURY: "What does the injury look like?"        Open to air                                                                              2. SIZE: "How large is the cut?"     Under arm  Near elbow.   3. BLEEDING: "Is it bleeding now?" If so, ask: "Is it difficult to stop?"       "oozing" some blood pus 4. LOCATION: "Where is the injury located?"      *No Answer* 5. ONSET: "How long ago did the injury occur?"     Yesterday about 5am 6. MECHANISM: "Tell me how it happened."     See notes 7. TETANUS: "When was the last tetanus booster?"     yes  Protocols used: SKIN INJURY-A-AH

## 2018-07-09 NOTE — Telephone Encounter (Signed)
Appt w/ Dr. Nani Ravens at 3:45.

## 2018-07-09 NOTE — Progress Notes (Signed)
Chief Complaint  Patient presents with  . Wound Infection    Pt was seen in the office 07/08/18. Draining yellow fluid today and more painful.     Ronald Petersen is a 62 y.o. male here for a skin complaint.  Duration: 2 days Location: RUE Pruritic? No Painful? Yes Drainage? Yes Had SCC removed almost 2 weeks ago, had tried to keep it clean and dry but has been sweating quite a bit.  Therapies tried thus far: Started doxy yesterday after being seen  ROS:  Const: No fevers Skin: As noted in HPI  Past Medical History:  Diagnosis Date  . Anxiety   . Arthritis   . Depression   . Diabetes mellitus   . GERD (gastroesophageal reflux disease)   . Hyperlipidemia   . Hypertension   . Neuromuscular disorder ([place])    NEUROPATHY  . Sleep apnea    BP 122/72 (BP Location: Left Arm, Patient Position: Sitting, Cuff Size: Large)   Pulse 70   Temp 98.1 F (36.7 C) (Oral)   Ht 6' (1.829 m)   Wt 225 lb 6.4 oz (102.2 kg)   SpO2 97%   BMI 30.57 kg/m  Gen: awake, alert, appearing stated age Lungs: No accessory muscle use Skin: site of excision appears well approximated with sutures, some redness and induration, serous fluid can be expelled. There is surrounding erythema that is warm and slightly ttp.  Psych: Age appropriate judgment and insight  Skin infection  Cont doxy. I removed 2 sutures to help drainage. Warning signs and symptoms verbalized and written down in AVS. ED over weekend. He is scheduled for f/u in 3 days, I would like him to keep that appt. Could change to bactrim and Keflex if no improvement. The patient voiced understanding and agreement to the plan.  [place], DO 07/09/18 4:23 PM

## 2018-07-09 NOTE — Patient Instructions (Signed)
Keep your appointment on Monday.   Do not vigorously scrub. Apply triple antibiotic ointment (like Neosporin) twice daily. Keep the area clean and dry.   Things to look out for: increasing pain not relieved by ibuprofen/acetaminophen, fevers, spreading redness, increasing drainage of pus, or foul odor.   Let us know if you need anything.

## 2018-07-11 ENCOUNTER — Encounter (HOSPITAL_BASED_OUTPATIENT_CLINIC_OR_DEPARTMENT_OTHER): Payer: Self-pay | Admitting: Emergency Medicine

## 2018-07-11 ENCOUNTER — Other Ambulatory Visit: Payer: Self-pay

## 2018-07-11 ENCOUNTER — Emergency Department (HOSPITAL_BASED_OUTPATIENT_CLINIC_OR_DEPARTMENT_OTHER)
Admission: EM | Admit: 2018-07-11 | Discharge: 2018-07-11 | Disposition: A | Discharge status: Home / Self Care | Payer: Medicaid Other | Attending: Emergency Medicine | Admitting: Emergency Medicine

## 2018-07-11 DIAGNOSIS — Z85828 Personal history of other malignant neoplasm of skin: Secondary | ICD-10-CM | POA: Insufficient documentation

## 2018-07-11 DIAGNOSIS — R0981 Nasal congestion: Secondary | ICD-10-CM | POA: Insufficient documentation

## 2018-07-11 DIAGNOSIS — R110 Nausea: Secondary | ICD-10-CM | POA: Insufficient documentation

## 2018-07-11 DIAGNOSIS — Z79899 Other long term (current) drug therapy: Secondary | ICD-10-CM | POA: Insufficient documentation

## 2018-07-11 DIAGNOSIS — Z5189 Encounter for other specified aftercare: Secondary | ICD-10-CM | POA: Insufficient documentation

## 2018-07-11 DIAGNOSIS — I10 Essential (primary) hypertension: Secondary | ICD-10-CM | POA: Insufficient documentation

## 2018-07-11 DIAGNOSIS — Z87891 Personal history of nicotine dependence: Secondary | ICD-10-CM | POA: Insufficient documentation

## 2018-07-11 DIAGNOSIS — C44622 Squamous cell carcinoma of skin of right upper limb, including shoulder: Secondary | ICD-10-CM | POA: Insufficient documentation

## 2018-07-11 DIAGNOSIS — Z7984 Long term (current) use of oral hypoglycemic drugs: Secondary | ICD-10-CM | POA: Insufficient documentation

## 2018-07-11 DIAGNOSIS — E119 Type 2 diabetes mellitus without complications: Secondary | ICD-10-CM | POA: Insufficient documentation

## 2018-07-11 LAB — CBG MONITORING, ED: GLUCOSE-CAPILLARY: 188 mg/dL — AB (ref 70–99)

## 2018-07-11 LAB — WOUND CULTURE
MICRO NUMBER:: 90822854
SPECIMEN QUALITY:: ADEQUATE

## 2018-07-11 NOTE — ED Provider Notes (Signed)
[facility] EMERGENCY DEPARTMENT Provider Note   CSN: [id number] Arrival date & time: 07/11/18  1543     History   Chief Complaint Chief Complaint  Patient presents with  . Wound Check    HPI Ronald Petersen is a 62 y.o. male.  Patient was status post skin biopsy that B squamous cell carcinoma that got secondarily infected.  Procedure done by family medicine.  Patient seen on by them on Thursday and Friday.  Is been on doxycycline since late Thursday.  Patient describes that at one point the redness was throughout the entire right arm.  The biopsy site is proximal forearm.  That has slowly improved over the last couple days but patient feels as if it is taking longer for the wound to heal.  There was purulent discharge now which just really serous.  Patient also with development of "" feeling bad headache some congestion.  Denies any fever or chills.  Blood pressure elevated a little bit at home blood sugar running around 200 but that is not too abnormal for him.  Patient was concerned that there was a connection between the wound infection his other symptoms.  He stated that if he did have the concerns for the wound the other symptoms would have not alarmed him and he would have not come in.  Apparently the wound did grow staph.  That is why patient was treated with doxycycline.  Patient has follow-up with the primary care doctor already scheduled for tomorrow.     Past Medical History:  Diagnosis Date  . Anxiety   . Arthritis   . Depression   . Diabetes mellitus   . GERD (gastroesophageal reflux disease)   . Hyperlipidemia   . Hypertension   . Neuromuscular disorder ([place])    NEUROPATHY  . Sleep apnea     Patient Active Problem List   Diagnosis Date Noted  . 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 Essential hypertension 09/14/2017  . Preventative health care 06/12/2016  . OSA (obstructive sleep apnea) 04/07/2016  . Joint  pain 04/05/2016  . NSAID long-term use 12/13/2015  . Nausea without vomiting 12/13/2015  . History of colonic polyps 12/13/2015  . Loss of weight 12/13/2015  . Depression 11/06/2015  . Metatarsal deformity 02/20/2015  . Equinus deformity of foot, acquired 02/20/2015  . Plantar fasciitis, bilateral 02/15/2015  . Left wrist injury 06/15/2014  . Peripheral neuropathy 04/01/2012  . FATIGUE 11/05/2010  . OTHER SPECIFIED DISORDER OF PENIS 03/26/2010  . NAUSEA 03/26/2010  . Pain in limb 10/17/2008  . NECK PAIN, CHRONIC 11/24/2007  . DM (diabetes mellitus) type II uncontrolled, periph vascular disorder ([place]) 01/27/2007  . Hyperlipidemia LDL goal <70 01/27/2007    Past Surgical History:  Procedure Laterality Date  . APPENDECTOMY    . CHOLECYSTECTOMY    . LUMBAR LAMINECTOMY          Home Medications    Prior to Admission medications   Medication Sig Start Date End Date Taking? Authorizing Provider  atorvastatin (LIPITOR) 20 MG tablet Take 1 tablet (20 mg total) by mouth daily. 12/18/17   [name], DO  BAYER CONTOUR TEST test strip Check Blood sugar once daily. Dx E11.9 02/26/16   [name], Alferd Apa, DO  BAYER MICROLET LANCETS lancets Check Blood sugar once daily. Dx E11.9 02/26/16   [name], Alferd Apa, DO  Blood Glucose Monitoring Suppl (CONTOUR BLOOD GLUCOSE SYSTEM) DEVI Check  Blood sugar once daily. Dx E11.9 01/25/18   [name], Alferd Apa, DO  doxycycline (VIBRA-TABS) 100 MG tablet Take 1 tablet (100 mg total) by mouth 2 (two) times daily. Can give caps or generic 07/08/18   Sagu[place], Percell Miller, PA-C  gabapentin (NEURONTIN) 300 MG capsule TAKE 1 CAPSULE (300 MG TOTAL) BY MOUTH 3 (THREE) TIMES DAILY. 07/02/18   Roma Schanz R, DO  Ibuprofen 200 MG CAPS Take by mouth. Patient takes 5-20 tablets daily.    [provider]  Lancet Devices (BAYER MICROLET 2 LANCING [facility]) MISC Check Blood sugar once daily. Dx E11.9 02/26/16   [name], Alferd Apa, DO  lisinopril  (PRINIVIL,ZESTRIL) 5 MG tablet TAKE 1 TABLET (5 MG TOTAL) BY MOUTH DAILY. 07/02/18   [name], DO  metFORMIN (GLUCOPHAGE-XR) 500 MG 24 hr tablet Take 2 tablets (1,000 mg total) by mouth daily. 09/17/17   [name], DO  omeprazole (PRILOSEC) 40 MG capsule TAKE 1 CAPSULE (40 MG TOTAL) BY MOUTH DAILY. 06/12/16   [name], DO  oxyCODONE-acetaminophen (ROXICET) 5-325 MG tablet Take 1 tablet by mouth every 8 (eight) hours as needed for severe pain. 06/29/18   Mosie Lukes, MD  piroxicam (FELDENE) 10 MG capsule Take 1 capsule (10 mg total) by mouth daily. 06/12/16   Roma Schanz R, DO  sertraline (ZOLOFT) 100 MG tablet TAKE 1 TABLET (100 MG TOTAL) BY MOUTH DAILY. 07/02/18   [name], DO  simvastatin (ZOCOR) 40 MG tablet Take 1 tablet (40 mg total) by mouth at bedtime. 06/25/16   [name], DO  sitaGLIPtin (JANUVIA) 100 MG tablet Take 1 tablet (100 mg total) by mouth daily. 12/18/17   [name], DO    Family History Family History  Problem Relation Age of Onset  . Alcohol abuse Unknown   . Depression Unknown   . Arthritis Unknown   . Hypertension Unknown   . Ovarian cancer Sister   . Stomach cancer Sister   . Heart disease Sister        MI  . Stomach cancer Maternal Grandmother   . COPD Mother   . Stroke Father   . Coronary artery disease Unknown   . Heart disease Sister        MI  . Ovarian cancer Unknown        neice  . Ovarian cancer Unknown        neice  . Colon cancer Unknown     Social History Social History   Tobacco Use  . Smoking status: Former Smoker    Years: 16.00    Types: Cigarettes    Last attempt to quit: 12/29/1985    Years since quitting: 32.5  . Smokeless tobacco: Never Used  Substance Use Topics  . Alcohol use: Yes    Alcohol/week: 2.4 - 3.0 oz    Types: 4 - 5 Cans of beer per week    Comment: daily - 1-2 beers  . Drug use: No     Allergies   Niacin   Review of Systems Review  of Systems  Constitutional: Positive for fatigue. Negative for chills and fever.  HENT: Positive for congestion.   Eyes: Negative for redness and visual disturbance.  Respiratory: Negative for shortness of breath.   Cardiovascular: Negative for chest pain.  Gastrointestinal: Positive for nausea. Negative for abdominal pain, diarrhea and vomiting.  Genitourinary: Negative for dysuria.  Musculoskeletal: Negative for back pain  and neck pain.  Skin: Positive for wound.  Neurological: Positive for headaches.  Hematological: Does not bruise/bleed easily.  Psychiatric/Behavioral: Negative for confusion.     Physical Exam Updated Vital Signs BP 124/87   Pulse 62   Temp 98.2 F (36.8 C) (Oral)   Resp 18   Ht 1.829 m (6')   Wt 102.1 kg (225 lb)   SpO2 100%   BMI 30.52 kg/m   Physical Exam  Constitutional: He is oriented to person, place, and time. He appears well-developed and well-nourished. No distress.  HENT:  Head: Normocephalic and atraumatic.  Mouth/Throat: Oropharynx is clear and moist.  Eyes: Pupils are equal, round, and reactive to light. Conjunctivae and EOM are normal.  Neck: Normal range of motion. Neck supple.  Cardiovascular: Normal rate, regular rhythm and normal heart sounds.  Pulmonary/Chest: Effort normal and breath sounds normal. No respiratory distress.  Abdominal: Soft. Bowel sounds are normal. There is no tenderness.  Musculoskeletal: Normal range of motion.  Right arm with a proximal forearm skin biopsy for squamous cell carcinoma.  A few stitches still intact.  Some surrounding induration wound a little bit open no purulent discharge induration measuring probably about 4 x 4 cm.  Some surrounding erythema.  Patient states erythema was all way down the arm before so it has improved significantly.  Radial pulse both arms is 2+.  Neurological: He is alert and oriented to person, place, and time. No cranial nerve deficit or sensory deficit. He exhibits normal muscle  tone. Coordination normal.  Skin: Skin is warm.  Nursing note and vitals reviewed.    ED Treatments / Results  Labs (all labs ordered are listed, but only abnormal results are displayed) Labs Reviewed  CBG MONITORING, ED - Abnormal; Notable for the following components:      Result Value   Glucose-Capillary 188 (*)    All other components within normal limits    EKG None  Radiology No results found.  Procedures Procedures (including critical care time)  Medications Ordered in ED Medications - No data to display   Initial Impression / Assessment and Plan / ED Course  I have reviewed the triage vital signs and the nursing notes.  Pertinent labs & imaging results that were available during my care of the patient were reviewed by me and considered in my medical decision making (see chart for details).     Right arm wound based on descriptions of help was 2 days ago seems to be slowly improving.  Continue the doxycycline.  Your other symptoms of the congestion and some nausea most likely not related.  Would be concerned about fever or chills.  Keep your appointment for the recheck with your doctor they had done the procedure as scheduled for tomorrow morning.  Patient nontoxic no acute distress.  Blood sugars here below 200.  Blood pressure here slightly elevated close follow-up of both of these with primary care doctor.  Final Clinical Impressions(s) / ED Diagnoses   Final diagnoses:  Visit for wound check    ED Discharge Orders    None       Fredia Sorrow, MD 07/11/18 1840

## 2018-07-11 NOTE — ED Notes (Signed)
Pt ambulatory with steady gait to d/c window

## 2018-07-11 NOTE — ED Notes (Signed)
ED Provider at bedside. 

## 2018-07-11 NOTE — ED Triage Notes (Signed)
Patient had a cancer removed to his left upper arm - it then developed into a staph infect - the patient states that he was given a "shot" for antibiotics. The patient states that the wound is "raw" and he now "has sinus problems, his sugar is up , his BP is up and it is not healing like I think it should be" -

## 2018-07-11 NOTE — Discharge Instructions (Addendum)
Continue the current antibiotic.  Follow-up with your primary care doctor tomorrow for recheck of the wound.  Wound appears to be slowly improving.  Other symptoms seem to be more consistent may be with a viral syndrome.  Return for fever or chills or any worsening of the right arm wound spreading of redness increased pus.

## 2018-07-12 ENCOUNTER — Ambulatory Visit (INDEPENDENT_AMBULATORY_CARE_PROVIDER_SITE_OTHER): Payer: Self-pay | Admitting: Family Medicine

## 2018-07-12 ENCOUNTER — Encounter: Payer: Self-pay | Admitting: Family Medicine

## 2018-07-12 VITALS — BP 122/76 | HR 65 | Temp 98.1°F | Ht 72.0 in | Wt 225.0 lb

## 2018-07-12 DIAGNOSIS — R110 Nausea: Secondary | ICD-10-CM

## 2018-07-12 DIAGNOSIS — D0461 Carcinoma in situ of skin of right upper limb, including shoulder: Secondary | ICD-10-CM

## 2018-07-12 MED ORDER — ONDANSETRON HCL 4 MG PO TABS: 4.0000 mg | ORAL_TABLET | Freq: Three times a day (TID) | ORAL | 0 refills | Status: DC | PRN

## 2018-07-12 MED FILL — ONDANSETRON HCL 4 MG TABLET: 4 | 7 days supply | Qty: 20 | Fill #0

## 2018-07-12 NOTE — Progress Notes (Signed)
Chief Complaint  Patient presents with  . Suture / Staple Removal    Ronald Petersen is a 62 y.o. male here for f/u skin complaint.  10 days post procedure he was tx'd for infection. Started on doxy, cx showed this was correct. Doing much better today. Still having some drainage. Went to ED yesterday, sent home without change in therapy.  I removed some sutures on Friday to help facilitate drainage.  He would like the rest removed today.  ROS:  Const: No fevers Skin: As noted in HPI  Past Medical History:  Diagnosis Date  . Anxiety   . Arthritis   . Depression   . Diabetes mellitus   . GERD (gastroesophageal reflux disease)   . Hyperlipidemia   . Hypertension   . Neuromuscular disorder ([place])    NEUROPATHY  . Sleep apnea    BP 122/76 (BP Location: Left Arm, Patient Position: Sitting, Cuff Size: Normal)   Pulse 65   Temp 98.1 F (36.7 C) (Oral)   Ht 6' (1.829 m)   Wt 225 lb (102.1 kg)   SpO2 97%   BMI 30.52 kg/m  Gen: awake, alert, appearing stated age Lungs: No accessory muscle use, CTAB Heart: RRR Abd: Soft, NT, ND Skin: Slightly erythematous area on RUE medially. There is a sagittal incision with 4 simple sutures remaining. +scant drainage. 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 Skin looks much better after being on abx, pt feels better. Nausea likely side effect from doxy.  Zofran as needed. F/u prn. The patient voiced understanding and agreement to the plan.  [place], DO 07/12/18 4:42 PM

## 2018-07-12 NOTE — Progress Notes (Signed)
Pre visit review using our clinic review tool, if applicable. No additional management support is needed unless otherwise documented below in the visit note. 

## 2018-07-12 NOTE — Patient Instructions (Signed)
Your arm looks great today. Finish the antibiotic. Stay hydrated.  Fevers, worsening redness, increasing pain should prompt you to return.  Let us know if you need anything.

## 2018-08-03 ENCOUNTER — Ambulatory Visit (INDEPENDENT_AMBULATORY_CARE_PROVIDER_SITE_OTHER): Payer: Medicaid Other | Admitting: Family Medicine

## 2018-08-03 ENCOUNTER — Encounter: Payer: Self-pay | Admitting: Family Medicine

## 2018-08-03 VITALS — BP 138/64 | HR 54 | Temp 97.7°F | Ht 72.0 in | Wt 222.0 lb

## 2018-08-03 DIAGNOSIS — E1151 Type 2 diabetes mellitus with diabetic peripheral angiopathy without gangrene: Secondary | ICD-10-CM

## 2018-08-03 DIAGNOSIS — E1165 Type 2 diabetes mellitus with hyperglycemia: Secondary | ICD-10-CM

## 2018-08-03 DIAGNOSIS — I10 Essential (primary) hypertension: Secondary | ICD-10-CM

## 2018-08-03 DIAGNOSIS — Z79899 Other long term (current) drug therapy: Secondary | ICD-10-CM

## 2018-08-03 DIAGNOSIS — IMO0002 Reserved for concepts with insufficient information to code with codable children: Secondary | ICD-10-CM

## 2018-08-03 DIAGNOSIS — G8929 Other chronic pain: Secondary | ICD-10-CM

## 2018-08-03 DIAGNOSIS — M542 Cervicalgia: Secondary | ICD-10-CM

## 2018-08-03 DIAGNOSIS — E785 Hyperlipidemia, unspecified: Secondary | ICD-10-CM

## 2018-08-03 DIAGNOSIS — E1169 Type 2 diabetes mellitus with other specified complication: Secondary | ICD-10-CM

## 2018-08-03 NOTE — Assessment & Plan Note (Signed)
hgba1c to be checked, minimize simple carbs. Increase exercise as tolerated. Continue current meds  

## 2018-08-03 NOTE — Assessment & Plan Note (Signed)
Tolerating statin, encouraged heart healthy diet, avoid trans fats, minimize simple carbs and saturated fats. Increase exercise as tolerated 

## 2018-08-03 NOTE — Progress Notes (Signed)
Patient ID: Ronald Petersen, male    DOB: 1956-12-18  Age: 62 y.o. MRN: [id number]    Subjective:  Subjective  HPI Ronald Petersen presents for f/u dm, bp and hyperlipidemia.   HYPERTENSION   Blood pressure range-not checking   Chest pain- no      Dyspnea- no Lightheadedness- no   Edema- no  Other side effects - no   Medication compliance: good Low salt diet- yew    DIABETES    Blood Sugar ranges-not checking   Polyuria- no New Visual problems- no  Hypoglycemic symptoms- no  Other side effects-no Medication compliance - good Last eye exam- due Foot exam- today   HYPERLIPIDEMIA  Medication compliance- good RUQ pain- no  Muscle aches- no Other side effects-no  Review of Systems  Constitutional: Negative for chills and fever.  HENT: Negative for congestion and hearing loss.   Eyes: Negative for discharge.  Respiratory: Negative for cough and shortness of breath.   Cardiovascular: Negative for chest pain, palpitations and leg swelling.  Gastrointestinal: Negative for abdominal pain, blood in stool, constipation, diarrhea, nausea and vomiting.  Genitourinary: Negative for dysuria, frequency, hematuria and urgency.  Musculoskeletal: Negative for back pain and myalgias.  Skin: Negative for rash.  Allergic/Immunologic: Negative for environmental allergies.  Neurological: Negative for dizziness, weakness and headaches.  Hematological: Does not bruise/bleed easily.  Psychiatric/Behavioral: Negative for suicidal ideas. The patient is not nervous/anxious.     History Past Medical History:  Diagnosis Date  . Anxiety   . Arthritis   . Depression   . Diabetes mellitus   . GERD (gastroesophageal reflux disease)   . Hyperlipidemia   . Hypertension   . Neuromuscular disorder ([place])    NEUROPATHY  . Sleep apnea     He has a past surgical history that includes Appendectomy; Cholecystectomy; and Lumbar laminectomy.   His family history includes Alcohol abuse in his unknown  relative; Arthritis in his unknown relative; COPD in his mother; Colon cancer in his unknown relative; Coronary artery disease in his unknown relative; Depression in his unknown relative; Heart disease in his sister and sister; Hypertension in his unknown relative; Ovarian cancer in his sister, unknown relative, and unknown relative; Stomach cancer in his maternal grandmother and sister; Stroke in his father.He reports that he quit smoking about 32 years ago. His smoking use included cigarettes. He quit after 16.00 years of use. He has never used smokeless tobacco. He reports that he drinks about 2.4 - 3.0 oz of alcohol per week. He reports that he does not use drugs.  Current Outpatient Medications on File Prior to Visit  Medication Sig Dispense Refill  . atorvastatin (LIPITOR) 20 MG tablet Take 1 tablet (20 mg total) by mouth daily. 90 tablet 3  . BAYER CONTOUR TEST test strip Check Blood sugar once daily. Dx E11.9 50 each 12  . BAYER MICROLET LANCETS lancets Check Blood sugar once daily. Dx E11.9 50 each 11  . Blood Glucose Monitoring Suppl (CONTOUR BLOOD GLUCOSE SYSTEM) DEVI Check Blood sugar once daily. Dx E11.9 1 Device 0  . gabapentin (NEURONTIN) 300 MG capsule TAKE 1 CAPSULE (300 MG TOTAL) BY MOUTH 3 (THREE) TIMES DAILY. 180 capsule 0  . Ibuprofen 200 MG CAPS Take by mouth. Patient takes 5-20 tablets daily.    [name] Devices (BAYER MICROLET 2 LANCING DEVIC) MISC Check Blood sugar once daily. Dx E11.9 50 each 11  . lisinopril (PRINIVIL,ZESTRIL) 5 MG tablet TAKE 1 TABLET (5 MG TOTAL) BY MOUTH  DAILY. 90 tablet 0  . metFORMIN (GLUCOPHAGE-XR) 500 MG 24 hr tablet Take 2 tablets (1,000 mg total) by mouth daily. 180 tablet 1  . omeprazole (PRILOSEC) 40 MG capsule TAKE 1 CAPSULE (40 MG TOTAL) BY MOUTH DAILY. 30 capsule 3  . ondansetron (ZOFRAN) 4 MG tablet Take 1 tablet (4 mg total) by mouth every 8 (eight) hours as needed. 20 tablet 0  . oxyCODONE-acetaminophen (ROXICET) 5-325 MG tablet Take 1  tablet by mouth every 8 (eight) hours as needed for severe pain. 90 tablet 0  . piroxicam (FELDENE) 10 MG capsule Take 1 capsule (10 mg total) by mouth daily. 30 capsule 2  . sertraline (ZOLOFT) 100 MG tablet TAKE 1 TABLET (100 MG TOTAL) BY MOUTH DAILY. 180 tablet 0  . simvastatin (ZOCOR) 40 MG tablet Take 1 tablet (40 mg total) by mouth at bedtime. 30 tablet 5  . sitaGLIPtin (JANUVIA) 100 MG tablet Take 1 tablet (100 mg total) by mouth daily. 30 tablet 2   No current facility-administered medications on file prior to visit.      Objective:  Objective  Physical Exam  Constitutional: He is oriented to person, place, and time. Vital signs are normal. He appears well-developed and well-nourished. He is sleeping.  HENT:  Head: Normocephalic and atraumatic.  Mouth/Throat: Oropharynx is clear and moist.  Eyes: Pupils are equal, round, and reactive to light. EOM are normal.  Neck: Normal range of motion. Neck supple. No thyromegaly present.  Cardiovascular: Normal rate and regular rhythm.  No murmur heard. Pulmonary/Chest: Effort normal and breath sounds normal. No respiratory distress. He has no wheezes. He has no rales. He exhibits no tenderness.  Musculoskeletal: He exhibits no edema or tenderness.  Neurological: He is alert and oriented to person, place, and time.  Skin: Skin is warm and dry.  Psychiatric: He has a normal mood and affect. His behavior is normal. Judgment and thought content normal.  Nursing note and vitals reviewed.  Diabetic Foot Exam - Simple   Simple Foot Form Diabetic Foot exam was performed with the following findings:  Yes 08/03/2018 12:52 PM  Visual Inspection No deformities, no ulcerations, no other skin breakdown bilaterally:  Yes Sensation Testing Intact to touch and monofilament testing bilaterally:  Yes Pulse Check Posterior Tibialis and Dorsalis pulse intact bilaterally:  Yes Comments     There were no vitals taken for this visit. Wt Readings from  Last 3 Encounters:  07/12/18 225 lb (102.1 kg)  07/11/18 225 lb (102.1 kg)  07/09/18 225 lb 6.4 oz (102.2 kg)     Lab Results  Component Value Date   WBC 7.6 07/08/2018   HGB 15.5 07/08/2018   HCT 44.5 07/08/2018   PLT 156.0 07/08/2018   GLUCOSE 211 (H) 12/17/2017   CHOL 196 12/17/2017   TRIG 253.0 (H) 12/17/2017   HDL 32.50 (L) 12/17/2017   LDLDIRECT 135.0 12/17/2017   LDLCALC 139 (H) 06/12/2016   ALT 22 12/17/2017   AST 15 12/17/2017   NA 137 12/17/2017   K 4.0 12/17/2017   CL 102 12/17/2017   CREATININE 0.86 12/17/2017   BUN 14 12/17/2017   CO2 28 12/17/2017   TSH 0.97 10/25/2012   PSA 1.32 09/14/2017   HGBA1C 8.4 (H) 12/17/2017   MICROALBUR 1.2 02/26/2016    No results found.   Assessment & Plan:  Plan  I have discontinued Ronald Petersen's doxycycline. I am also having him maintain his Ibuprofen, BAYER CONTOUR TEST, BAYER MICROLET 2 LANCING DEVIC, BAYER  MICROLET LANCETS, piroxicam, omeprazole, simvastatin, metFORMIN, sitaGLIPtin, atorvastatin, CONTOUR BLOOD GLUCOSE SYSTEM, oxyCODONE-acetaminophen, lisinopril, gabapentin, sertraline, and ondansetron.  No orders of the defined types were placed in this encounter.   Problem List Items Addressed This Visit      Unprioritized   DM (diabetes mellitus) type II uncontrolled, periph vascular disorder ([place])    hgba1c to be checked, minimize simple carbs. Increase exercise as tolerated. Continue current meds       Relevant Orders   Hemoglobin A1c   Comprehensive metabolic panel   Essential hypertension    Well controlled, no changes to meds. Encouraged heart healthy diet such as the DASH diet and exercise as tolerated.       Relevant Orders   Hemoglobin A1c   Comprehensive metabolic panel   Lipid panel   Hyperlipidemia LDL goal <70    Tolerating statin, encouraged heart healthy diet, avoid trans fats, minimize simple carbs and saturated fats. Increase exercise as tolerated      NECK PAIN, CHRONIC - Primary     uds done Contract  No refill needed today      Relevant Orders   Pain Mgmt, Profile 8 w/Conf, U    Other Visit Diagnoses    Other chronic pain       Relevant Orders   Pain Mgmt, Profile 8 w/Conf, U   High risk medication use       Relevant Orders   Pain Mgmt, Profile 8 w/Conf, U   Hyperlipidemia associated with type 2 diabetes mellitus ([place])       Relevant Orders   Comprehensive metabolic panel   Lipid panel      Follow-up: Return in about 6 months (around 02/03/2019), or if symptoms worsen or fail to improve, for hypertension, hyperlipidemia, diabetes II.  [name], DO

## 2018-08-03 NOTE — Assessment & Plan Note (Signed)
Well controlled, no changes to meds. Encouraged heart healthy diet such as the DASH diet and exercise as tolerated.  °

## 2018-08-03 NOTE — Assessment & Plan Note (Signed)
uds done Contract  No refill needed today

## 2018-08-03 NOTE — Patient Instructions (Signed)

## 2018-08-06 ENCOUNTER — Ambulatory Visit: Payer: Self-pay | Admitting: Family Medicine

## 2018-08-07 LAB — PAIN MGMT, PROFILE 8 W/CONF, U
6 Acetylmorphine: NEGATIVE ng/mL (ref <10)
ALCOHOL METABOLITES: POSITIVE ng/mL — AB (ref <500)
Amphetamines: NEGATIVE ng/mL (ref <500)
BENZODIAZEPINES: NEGATIVE ng/mL (ref <100)
Buprenorphine, Urine: NEGATIVE ng/mL (ref <5)
CREATININE: 47.7 mg/dL
Cocaine Metabolite: NEGATIVE ng/mL (ref <150)
ETHYL GLUCURONIDE (ETG): 11683 ng/mL — AB (ref <500)
Ethyl Sulfate (ETS): 1746 ng/mL — ABNORMAL HIGH (ref <100)
MARIJUANA METABOLITE: NEGATIVE ng/mL (ref <20)
MDMA: NEGATIVE ng/mL (ref <500)
Noroxycodone: 281 ng/mL — ABNORMAL HIGH (ref <50)
OXYCODONE: 178 ng/mL — AB (ref <50)
OXYCODONE: POSITIVE ng/mL — AB (ref <100)
Opiates: NEGATIVE ng/mL (ref <100)
Oxidant: NEGATIVE ug/mL (ref <200)
Oxymorphone: 110 ng/mL — ABNORMAL HIGH (ref <50)
PH: 6.28 (ref 4.5–9.0)

## 2018-08-19 ENCOUNTER — Telehealth: Payer: Self-pay | Admitting: Family Medicine

## 2018-08-19 NOTE — Telephone Encounter (Signed)
Copied from [place] [id number]. Topic: Quick Communication - Rx Refill/Question >> Aug 19, 2018  2:46 PM Burchel, Abbi R wrote: Medication: oxyCODONE-acetaminophen (ROXICET) 5-325 MG tablet   Preferred Pharmacy:  [facility] [address] [place] [address] Phone: [phone] Fax: [phone]   Pt was advised that RX refills may take up to 3 business days.

## 2018-08-20 ENCOUNTER — Other Ambulatory Visit: Payer: Self-pay | Admitting: Family Medicine

## 2018-08-20 DIAGNOSIS — G8929 Other chronic pain: Secondary | ICD-10-CM

## 2018-08-20 MED ORDER — OXYCODONE-ACETAMINOPHEN 5-325 MG PO TABS: 1.0000 | ORAL_TABLET | Freq: Three times a day (TID) | ORAL | 0 refills | Status: DC | PRN

## 2018-08-20 MED FILL — OXYCODONE-ACETAMINOPHEN 5-3: 5-325 | 30 days supply | Qty: 90 | Fill #0

## 2018-08-20 NOTE — Telephone Encounter (Signed)
refilled 

## 2018-08-20 NOTE — Telephone Encounter (Addendum)
Last oxycodone RX: 06/29/18, #90 Last OV: 08/03/18 Next OV: 11/04/18 UDS: 02/03/18, low risk. Repeat 6 months CSC: 02/03/18 CSR: last fill date shows 05/04/18  Attempted to contact [facility] to verify last dispense date and received message that pharmacy is closed until 2pm. Will try back then. Spoke with [facility]. They verified dispense date of 06/29/18, #90.  Pt now using [facility] and contract was updated previously.

## 2018-09-20 ENCOUNTER — Other Ambulatory Visit: Payer: Self-pay | Admitting: Family Medicine

## 2018-09-20 DIAGNOSIS — G8929 Other chronic pain: Secondary | ICD-10-CM

## 2018-09-20 NOTE — Telephone Encounter (Signed)
Ronald Petersen has requested a refill on his oxy-codone.

## 2018-09-21 ENCOUNTER — Ambulatory Visit: Payer: Self-pay | Admitting: Podiatry

## 2018-09-21 MED ORDER — OXYCODONE-ACETAMINOPHEN 5-325 MG PO TABS: 1.0000 | ORAL_TABLET | Freq: Three times a day (TID) | ORAL | 0 refills | Status: DC | PRN

## 2018-09-21 MED FILL — OXYCODONE-ACETAMINOPHEN 5-3: 5-325 | 30 days supply | Qty: 90 | Fill #0

## 2018-09-21 NOTE — Telephone Encounter (Signed)
Pt is requesting refill on oxycodone.   Last OV: 08/03/2018 Last Fill: 08/20/2018 #90 and 0RF UDS: 08/03/2018 Low risk

## 2018-09-28 ENCOUNTER — Encounter (HOSPITAL_BASED_OUTPATIENT_CLINIC_OR_DEPARTMENT_OTHER): Payer: Self-pay | Admitting: *Deleted

## 2018-09-28 ENCOUNTER — Emergency Department (HOSPITAL_BASED_OUTPATIENT_CLINIC_OR_DEPARTMENT_OTHER): Payer: Medicaid Other

## 2018-09-28 ENCOUNTER — Emergency Department (HOSPITAL_BASED_OUTPATIENT_CLINIC_OR_DEPARTMENT_OTHER)
Admission: EM | Admit: 2018-09-28 | Discharge: 2018-09-28 | Disposition: A | Discharge status: Home / Self Care | Payer: Medicaid Other | Attending: Emergency Medicine | Admitting: Emergency Medicine

## 2018-09-28 ENCOUNTER — Other Ambulatory Visit: Payer: Self-pay

## 2018-09-28 DIAGNOSIS — I10 Essential (primary) hypertension: Secondary | ICD-10-CM | POA: Insufficient documentation

## 2018-09-28 DIAGNOSIS — Z7984 Long term (current) use of oral hypoglycemic drugs: Secondary | ICD-10-CM | POA: Insufficient documentation

## 2018-09-28 DIAGNOSIS — Y929 Unspecified place or not applicable: Secondary | ICD-10-CM | POA: Insufficient documentation

## 2018-09-28 DIAGNOSIS — Z85828 Personal history of other malignant neoplasm of skin: Secondary | ICD-10-CM | POA: Insufficient documentation

## 2018-09-28 DIAGNOSIS — S46911A Strain of unspecified muscle, fascia and tendon at shoulder and upper arm level, right arm, initial encounter: Secondary | ICD-10-CM | POA: Insufficient documentation

## 2018-09-28 DIAGNOSIS — Y999 Unspecified external cause status: Secondary | ICD-10-CM | POA: Insufficient documentation

## 2018-09-28 DIAGNOSIS — X500XXA Overexertion from strenuous movement or load, initial encounter: Secondary | ICD-10-CM | POA: Insufficient documentation

## 2018-09-28 DIAGNOSIS — Y9389 Activity, other specified: Secondary | ICD-10-CM | POA: Insufficient documentation

## 2018-09-28 DIAGNOSIS — E119 Type 2 diabetes mellitus without complications: Secondary | ICD-10-CM | POA: Insufficient documentation

## 2018-09-28 DIAGNOSIS — Z79899 Other long term (current) drug therapy: Secondary | ICD-10-CM | POA: Insufficient documentation

## 2018-09-28 DIAGNOSIS — Z87891 Personal history of nicotine dependence: Secondary | ICD-10-CM | POA: Insufficient documentation

## 2018-09-28 NOTE — ED Notes (Signed)
Pt verbalizes understanding of d/c instructions and denies any further needs at this time. 

## 2018-09-28 NOTE — ED Notes (Signed)
Patient transported to X-ray 

## 2018-09-28 NOTE — ED Provider Notes (Signed)
[facility] Provider Note   CSN: [id number] Arrival date & time: 09/28/18  1649     History   Chief Complaint Chief Complaint  Patient presents with  . Shoulder Injury    HPI Ronald Petersen is a 62 y.o. male.  The history is provided by the patient. No language interpreter was used.  Shoulder Injury      62 year old male with history of diabetes presenting for evaluation of right shoulder injury.  Patient report while at work earlier today, he was walking down the step, carrying a heavy weight on his right arm while grabbing onto the railing with his left arm.  He mentioned the rounding came apart, he lost balance and felt an immediate pain to his right upper arm.  He is unsure if he dropped the object that he was carrying or if he hit anything.  Incident happened approximately an hour ago.  He report having sharp pain to his mid upper arm increased with movement.  Pain is adequately controlled after taking his home pain medication including oxycodone, gabapentin, and ibuprofen.  He denies any numbness.  He denies any headache or neck pain.  He denies any prior injury to his right arm.  He is right arm dominant.  Past Medical History:  Diagnosis Date  . Anxiety   . Arthritis   . Depression   . Diabetes mellitus   . GERD (gastroesophageal reflux disease)   . Hyperlipidemia   . Hypertension   . Neuromuscular disorder ([place])    NEUROPATHY  . Sleep apnea     Patient Active Problem List   Diagnosis Date Noted  . 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 Essential hypertension 09/14/2017  . Preventative health care 06/12/2016  . OSA (obstructive sleep apnea) 04/07/2016  . Joint pain 04/05/2016  . NSAID long-term use 12/13/2015  . Nausea without vomiting 12/13/2015  . History of colonic polyps 12/13/2015  . Loss of weight 12/13/2015  . Depression 11/06/2015  . Metatarsal deformity 02/20/2015  .  Equinus deformity of foot, acquired 02/20/2015  . Plantar fasciitis, bilateral 02/15/2015  . Left wrist injury 06/15/2014  . Peripheral neuropathy 04/01/2012  . FATIGUE 11/05/2010  . OTHER SPECIFIED DISORDER OF PENIS 03/26/2010  . NAUSEA 03/26/2010  . Pain in limb 10/17/2008  . NECK PAIN, CHRONIC 11/24/2007  . DM (diabetes mellitus) type II uncontrolled, periph vascular disorder ([place]) 01/27/2007  . Hyperlipidemia LDL goal <70 01/27/2007    Past Surgical History:  Procedure Laterality Date  . APPENDECTOMY    . CHOLECYSTECTOMY    . LUMBAR LAMINECTOMY          Home Medications    Prior to Admission medications   Medication Sig Start Date End Date Taking? Authorizing Provider  atorvastatin (LIPITOR) 20 MG tablet Take 1 tablet (20 mg total) by mouth daily. 12/18/17   [name], DO  BAYER CONTOUR TEST test strip Check Blood sugar once daily. Dx E11.9 02/26/16   [name], Alferd Apa, DO  BAYER MICROLET LANCETS lancets Check Blood sugar once daily. Dx E11.9 02/26/16   [name], Alferd Apa, DO  Blood Glucose Monitoring Suppl (CONTOUR BLOOD GLUCOSE SYSTEM) DEVI Check Blood sugar once daily. Dx E11.9 01/25/18   [name], Alferd Apa, DO  gabapentin (NEURONTIN) 300 MG capsule TAKE 1 CAPSULE (300 MG TOTAL) BY MOUTH 3 (THREE) TIMES DAILY. 07/02/18   Roma Schanz R, DO  Ibuprofen 200  MG CAPS Take by mouth. Patient takes 5-20 tablets daily.    [provider]  Lancet Devices (BAYER MICROLET 2 LANCING [facility]) MISC Check Blood sugar once daily. Dx E11.9 02/26/16   [name], Alferd Apa, DO  lisinopril (PRINIVIL,ZESTRIL) 5 MG tablet TAKE 1 TABLET (5 MG TOTAL) BY MOUTH DAILY. 07/02/18   [name], DO  metFORMIN (GLUCOPHAGE-XR) 500 MG 24 hr tablet Take 2 tablets (1,000 mg total) by mouth daily. 09/17/17   [name], DO  omeprazole (PRILOSEC) 40 MG capsule TAKE 1 CAPSULE (40 MG TOTAL) BY MOUTH DAILY. 06/12/16   Roma Schanz R, DO  ondansetron (ZOFRAN)  4 MG tablet Take 1 tablet (4 mg total) by mouth every 8 (eight) hours as needed. 07/12/18   Shelda Pal, DO  oxyCODONE-acetaminophen (ROXICET) 5-325 MG tablet Take 1 tablet by mouth every 8 (eight) hours as needed for severe pain. 09/21/18   [name], DO  piroxicam (FELDENE) 10 MG capsule Take 1 capsule (10 mg total) by mouth daily. 06/12/16   Roma Schanz R, DO  sertraline (ZOLOFT) 100 MG tablet TAKE 1 TABLET (100 MG TOTAL) BY MOUTH DAILY. 07/02/18   [name], DO  simvastatin (ZOCOR) 40 MG tablet Take 1 tablet (40 mg total) by mouth at bedtime. 06/25/16   [name], DO  sitaGLIPtin (JANUVIA) 100 MG tablet Take 1 tablet (100 mg total) by mouth daily. 12/18/17   [name], DO    Family History Family History  Problem Relation Age of Onset  . Ovarian cancer Sister   . Stomach cancer Sister   . Heart disease Sister        MI  . Heart disease Sister        MI  . Alcohol abuse Unknown   . Depression Unknown   . Arthritis Unknown   . Hypertension Unknown   . Stomach cancer Maternal Grandmother   . COPD Mother   . Stroke Father   . Coronary artery disease Unknown   . Ovarian cancer Unknown        neice  . Ovarian cancer Unknown        neice  . Colon cancer Unknown     Social History Social History   Tobacco Use  . Smoking status: Former Smoker    Years: 16.00    Types: Cigarettes    Last attempt to quit: 12/29/1985    Years since quitting: 32.7  . Smokeless tobacco: Never Used  Substance Use Topics  . Alcohol use: Yes    Alcohol/week: 4.0 - 5.0 standard drinks    Types: 4 - 5 Cans of beer per week    Comment: daily - 1-2 beers  . Drug use: No     Allergies   Niacin   Review of Systems Review of Systems  Constitutional: Negative for fever.  Musculoskeletal: Positive for arthralgias.  Skin: Negative for wound.  Neurological: Negative for numbness.     Physical Exam Updated Vital Signs BP (!) 154/85    Pulse 75   Temp 98.5 F (36.9 C)   Resp 16   Ht 5\' 11"  (1.803 m)   Wt 99.8 kg   SpO2 99%   BMI 30.68 kg/m   Physical Exam  Constitutional: He appears well-developed and well-nourished. No distress.  HENT:  Head: Atraumatic.  Eyes: Conjunctivae are normal.  Neck: Neck supple.  Cardiovascular: Intact distal pulses.  Musculoskeletal: He exhibits  tenderness (Right arm: Difficulty moving right shoulder and right elbow with both active and passive range of motion.  Increasing pain with elbow flexion but pain is located at the insertions of the biceps.  Pain with shoulder abduction and flexion but no deformity.).  No midline spine tenderness.  Neurological: He is alert.  Skin: No rash noted.  Psychiatric: He has a normal mood and affect.  Nursing note and vitals reviewed.    ED Treatments / Results  Labs (all labs ordered are listed, but only abnormal results are displayed) Labs Reviewed - No data to display  EKG None  Radiology No results found.  Procedures Procedures (including critical care time)  Medications Ordered in ED Medications - No data to display   Initial Impression / Assessment and Plan / ED Course  I have reviewed the triage vital signs and the nursing notes.  Pertinent labs & imaging results that were available during my care of the patient were reviewed by me and considered in my medical decision making (see chart for details).     BP (!) 154/85   Pulse 75   Temp 98.5 F (36.9 C)   Resp 16   Ht 5\' 11"  (1.803 m)   Wt 99.8 kg   SpO2 99%   BMI 30.68 kg/m    Final Clinical Impressions(s) / ED Diagnoses   Final diagnoses:  Right shoulder strain, initial encounter    ED Discharge Orders    None     5:38 PM Pt was carrying heavy objects on his R arm when the railing that he was holding onto came loose.  He felt a pop follows with pain to R upper arm. Xray of R shoulder and R humerus without acute finding.  Suspect muscle strain/tear  causing pain.  Sling provided for support.    Domenic Moras, PA-C 09/28/18 1744    Tegeler, Gwenyth Allegra, MD 09/28/18 [phone]

## 2018-09-28 NOTE — ED Triage Notes (Signed)
Pt c/o right shoulder / arm injury x 1 hr ago

## 2018-09-29 ENCOUNTER — Other Ambulatory Visit: Payer: Self-pay | Admitting: Family Medicine

## 2018-09-29 DIAGNOSIS — G8929 Other chronic pain: Secondary | ICD-10-CM

## 2018-09-29 DIAGNOSIS — M2550 Pain in unspecified joint: Secondary | ICD-10-CM

## 2018-09-29 MED FILL — SERTRALINE HCL 100 MG TAB: 100 | 90 days supply | Qty: 90 | Fill #1

## 2018-09-30 MED FILL — GABAPENTIN 300 MG CAPSULE: 300 | 90 days supply | Qty: 270 | Fill #0

## 2018-09-30 MED FILL — METFORMIN HCL ER 500 MG TAB: 500 | 90 days supply | Qty: 180 | Fill #0

## 2018-10-27 ENCOUNTER — Other Ambulatory Visit: Payer: Self-pay | Admitting: Family Medicine

## 2018-10-27 DIAGNOSIS — G8929 Other chronic pain: Secondary | ICD-10-CM

## 2018-10-27 NOTE — Telephone Encounter (Signed)
Pt is requesting refill on oxycodone.   Last OV: 08/03/2018 Last Fill: 09/21/2018 #90 and 0RF UDS: 08/03/2018 Low risk

## 2018-10-27 NOTE — Telephone Encounter (Signed)
Requested medication (s) are due for refill today -yes  Requested medication (s) are on the active medication list -yes  Future visit scheduled -yes  Last refill: 09/21/18  Notes to clinic: Patient is requesting a refill of non delegated Rx- for provider review   Requested Prescriptions  Pending Prescriptions Disp Refills   oxyCODONE-acetaminophen (ROXICET) 5-325 MG tablet 90 tablet 0    Sig: Take 1 tablet by mouth every 8 (eight) hours as needed for severe pain.     Not Delegated - Analgesics:  Opioid Agonist Combinations Failed - 10/27/2018  2:16 PM      Failed - This refill cannot be delegated      Failed - Urine Drug Screen completed in last 360 days.      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     Not Delegated - Analgesics:  Opioid Agonist Combinations Failed - 10/27/2018  2:16 PM      Failed - This refill cannot be  delegated      Failed - Urine Drug Screen completed in last 360 days.      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

## 2018-10-27 NOTE — Telephone Encounter (Signed)
Copied from [place] [id number]. Topic: Quick Communication - Rx Refill/Question >> Oct 27, 2018  2:06 PM Sheran Luz wrote: Medication: oxyCODONE-acetaminophen (ROXICET) 5-325 MG tablet  Pt is requesting refill of this medication. Please advise.   Preferred Pharmacy (with phone number or street name): [place], [place] [facility] [place] [phone] (Phone) [phone] (Fax)

## 2018-10-28 MED ORDER — OXYCODONE-ACETAMINOPHEN 5-325 MG PO TABS: 1.0000 | ORAL_TABLET | Freq: Three times a day (TID) | ORAL | 0 refills | Status: DC | PRN

## 2018-10-28 MED FILL — OXYCODONE-ACETAMINOPHEN 5-3: 5-325 | 30 days supply | Qty: 90 | Fill #0

## 2018-11-04 ENCOUNTER — Encounter: Payer: Self-pay | Admitting: Family Medicine

## 2018-11-04 ENCOUNTER — Ambulatory Visit (INDEPENDENT_AMBULATORY_CARE_PROVIDER_SITE_OTHER): Payer: Self-pay | Admitting: Family Medicine

## 2018-11-04 VITALS — BP 150/76 | HR 64 | Temp 98.6°F | Resp 16 | Ht 72.0 in | Wt 216.0 lb

## 2018-11-04 DIAGNOSIS — M25511 Pain in right shoulder: Secondary | ICD-10-CM | POA: Insufficient documentation

## 2018-11-04 DIAGNOSIS — E1169 Type 2 diabetes mellitus with other specified complication: Secondary | ICD-10-CM

## 2018-11-04 DIAGNOSIS — K219 Gastro-esophageal reflux disease without esophagitis: Secondary | ICD-10-CM

## 2018-11-04 DIAGNOSIS — E785 Hyperlipidemia, unspecified: Secondary | ICD-10-CM

## 2018-11-04 DIAGNOSIS — M25512 Pain in left shoulder: Secondary | ICD-10-CM

## 2018-11-04 DIAGNOSIS — G8929 Other chronic pain: Secondary | ICD-10-CM

## 2018-11-04 DIAGNOSIS — E1165 Type 2 diabetes mellitus with hyperglycemia: Secondary | ICD-10-CM

## 2018-11-04 DIAGNOSIS — IMO0002 Reserved for concepts with insufficient information to code with codable children: Secondary | ICD-10-CM

## 2018-11-04 DIAGNOSIS — M542 Cervicalgia: Secondary | ICD-10-CM

## 2018-11-04 DIAGNOSIS — I10 Essential (primary) hypertension: Secondary | ICD-10-CM

## 2018-11-04 DIAGNOSIS — E1151 Type 2 diabetes mellitus with diabetic peripheral angiopathy without gangrene: Secondary | ICD-10-CM

## 2018-11-04 LAB — COMPREHENSIVE METABOLIC PANEL
ALT: 21 U/L (ref 0–53)
AST: 14 U/L (ref 0–37)
Albumin: 4.9 g/dL (ref 3.5–5.2)
Alkaline Phosphatase: 90 U/L (ref 39–117)
BILIRUBIN TOTAL: 0.8 mg/dL (ref 0.2–1.2)
BUN: 16 mg/dL (ref 6–23)
CO2: 29 meq/L (ref 19–32)
CREATININE: 0.71 mg/dL (ref 0.40–1.50)
Calcium: 10.1 mg/dL (ref 8.4–10.5)
Chloride: 99 mEq/L (ref 96–112)
GFR: 118.96 mL/min (ref >60.00)
Glucose, Bld: 207 mg/dL — ABNORMAL HIGH (ref 70–99)
Potassium: 4 mEq/L (ref 3.5–5.1)
SODIUM: 136 meq/L (ref 135–145)
Total Protein: 7.6 g/dL (ref 6.0–8.3)

## 2018-11-04 LAB — LIPID PANEL
CHOL/HDL RATIO: 7
Cholesterol: 241 mg/dL — ABNORMAL HIGH (ref 0–200)
HDL: 36 mg/dL — AB (ref >39.00)
Triglycerides: 424 mg/dL — ABNORMAL HIGH (ref 0.0–149.0)

## 2018-11-04 LAB — LDL CHOLESTEROL, DIRECT: Direct LDL: 137 mg/dL

## 2018-11-04 LAB — HEMOGLOBIN A1C: Hgb A1c MFr Bld: 8.7 % — ABNORMAL HIGH (ref 4.6–6.5)

## 2018-11-04 MED ORDER — OMEPRAZOLE 40 MG PO CPDR: DELAYED_RELEASE_CAPSULE | ORAL | 3 refills | Status: DC

## 2018-11-04 MED ORDER — TIZANIDINE HCL 4 MG PO TABS: 4.0000 mg | ORAL_TABLET | Freq: Four times a day (QID) | ORAL | 1 refills | Status: DC | PRN

## 2018-11-04 MED FILL — tiZANidine HCL 4 MG TABS: 4 | 7 days supply | Qty: 30 | Fill #0

## 2018-11-04 MED FILL — OMEPRAZOLE 40 MG CPDR: 40 | 30 days supply | Qty: 30 | Fill #0

## 2018-11-04 NOTE — Assessment & Plan Note (Addendum)
Poorly controlled but pt is in a lot of pain, encouraged DASH diet, minimize caffeine and obtain adequate sleep. Report concerning symptoms and follow up as directed and as needed

## 2018-11-04 NOTE — Assessment & Plan Note (Signed)
Pt has no ins and has not been able to get surgery or see specialist He has been in a lot of pain He is trying to get disability

## 2018-11-04 NOTE — Assessment & Plan Note (Signed)
Refill pain med and refer to sport med

## 2018-11-04 NOTE — Patient Instructions (Signed)
Cervical Radiculopathy Cervical radiculopathy happens when a nerve in the neck (cervical nerve) is pinched or bruised. This condition can develop because of an injury or as part of the normal aging process. Pressure on the cervical nerves can cause pain or numbness that runs from the neck all the way down into the arm and fingers. Usually, this condition gets better with rest. Treatment may be needed if the condition does not improve. What are the causes? This condition may be caused by:  Injury.  Slipped (herniated) disk.  Muscle tightness in the neck because of overuse.  Arthritis.  Breakdown or degeneration in the bones and joints of the spine (spondylosis) due to aging.  Bone spurs that may develop near the cervical nerves.  What are the signs or symptoms? Symptoms of this condition include:  Pain that runs from the neck to the arm and hand. The pain can be severe or irritating. It may be worse when the neck is moved.  Numbness or weakness in the affected arm and hand.  How is this diagnosed? This condition may be diagnosed based on symptoms, medical history, and a physical exam. You may also have tests, including:  X-rays.  CT scan.  MRI.  Electromyogram (EMG).  Nerve conduction tests.  How is this treated? In many cases, treatment is not needed for this condition. With rest, the condition usually gets better over time. If treatment is needed, options may include:  Wearing a soft neck collar for short periods of time.  Physical therapy to strengthen your neck muscles.  Medicines, such as NSAIDs, oral corticosteroids, or spinal injections.  Surgery. This may be needed if other treatments do not help. Various types of surgery may be done depending on the cause of your problems.  Follow these instructions at home: Managing pain  Take over-the-counter and prescription medicines only as told by your health care provider.  If directed, apply ice to the affected  area. ? Put ice in a plastic bag. ? Place a towel between your skin and the bag. ? Leave the ice on for 20 minutes, 2-3 times per day.  If ice does not help, you can try using heat. Take a warm shower or warm bath, or use a heat pack as told by your health care provider.  Try a gentle neck and shoulder massage to help relieve symptoms. Activity  Rest as needed. Follow instructions from your health care provider about any restrictions on activities.  Do stretching and strengthening exercises as told by your health care provider or physical therapist. General instructions  If you were given a soft collar, wear it as told by your health care provider.  Use a flat pillow when you sleep.  Keep all follow-up visits as told by your health care provider. This is important. Contact a health care provider if:  Your condition does not improve with treatment. Get help right away if:  Your pain gets much worse and cannot be controlled with medicines.  You have weakness or numbness in your hand, arm, face, or leg.  You have a high fever.  You have a stiff, rigid neck.  You lose control of your bowels or your bladder (have incontinence).  You have trouble with walking, balance, or speaking. This information is not intended to replace advice given to you by your health care provider. Make sure you discuss any questions you have with your health care provider. Document Released: 09/09/2001 Document Revised: 05/22/2016 Document Reviewed: 02/08/2015 Elsevier Interactive Patient Education    2018 Elsevier Inc[employer]  

## 2018-11-04 NOTE — Progress Notes (Signed)
Patient ID: Ronald Petersen, male    DOB: 07-31-1956  Age: 62 y.o. MRN: [id number]    Subjective:  Subjective  HPI Ronald Petersen presents for chronic pain -- he just picked up his meds---  Not working well He fell over a month ago and hurt his shoulder but can not afford to see ortho because he has no ins.   Sugars are running high per pt.  He is not taking the more expensive meds .  He has applied for disability but was denied.  He is appealing.     Review of Systems  Constitutional: Negative for appetite change, diaphoresis, fatigue and unexpected weight change.  Eyes: Negative for pain, redness and visual disturbance.  Respiratory: Negative for cough, chest tightness, shortness of breath and wheezing.   Cardiovascular: Negative for chest pain, palpitations and leg swelling.  Endocrine: Negative for cold intolerance, heat intolerance, polydipsia, polyphagia and polyuria.  Genitourinary: Negative for difficulty urinating, dysuria and frequency.  Musculoskeletal: Positive for arthralgias, myalgias, neck pain and neck stiffness.  Neurological: Negative for dizziness, light-headedness, numbness and headaches.    History Past Medical History:  Diagnosis Date  . Anxiety   . Arthritis   . Depression   . Diabetes mellitus   . GERD (gastroesophageal reflux disease)   . Hyperlipidemia   . Hypertension   . Neuromuscular disorder ([place])    NEUROPATHY  . Sleep apnea     He has a past surgical history that includes Appendectomy; Cholecystectomy; and Lumbar laminectomy.   His family history includes Alcohol abuse in his unknown relative; Arthritis in his unknown relative; COPD in his mother; Colon cancer in his unknown relative; Coronary artery disease in his unknown relative; Depression in his unknown relative; Heart disease in his sister and sister; Hypertension in his unknown relative; Ovarian cancer in his sister, unknown relative, and unknown relative; Stomach cancer in his maternal  grandmother and sister; Stroke in his father.He reports that he quit smoking about 32 years ago. His smoking use included cigarettes. He quit after 16.00 years of use. He has never used smokeless tobacco. He reports that he drinks about 4.0 - 5.0 standard drinks of alcohol per week. He reports that he does not use drugs.  Current Outpatient Medications on File Prior to Visit  Medication Sig Dispense Refill  . BAYER CONTOUR TEST test strip Check Blood sugar once daily. Dx E11.9 50 each 12  . BAYER MICROLET LANCETS lancets Check Blood sugar once daily. Dx E11.9 50 each 11  . gabapentin (NEURONTIN) 300 MG capsule Take 1 capsule (300 mg total) by mouth 3 (three) times daily. 270 capsule 1  . Ibuprofen 200 MG CAPS Take by mouth. Patient takes 5-20 tablets daily.    Marland Kitchen lisinopril (PRINIVIL,ZESTRIL) 5 MG tablet TAKE 1 TABLET (5 MG TOTAL) BY MOUTH DAILY. 90 tablet 0  . metFORMIN (GLUCOPHAGE-XR) 500 MG 24 hr tablet TAKE TWO TABLETS BY MOUTH DAILY 180 tablet 1  . ondansetron (ZOFRAN) 4 MG tablet Take 1 tablet (4 mg total) by mouth every 8 (eight) hours as needed. 20 tablet 0  . oxyCODONE-acetaminophen (ROXICET) 5-325 MG tablet Take 1 tablet by mouth every 8 (eight) hours as needed for severe pain. 90 tablet 0  . piroxicam (FELDENE) 10 MG capsule Take 1 capsule (10 mg total) by mouth daily. 30 capsule 2  . sertraline (ZOLOFT) 100 MG tablet TAKE 1 TABLET (100 MG TOTAL) BY MOUTH DAILY. 180 tablet 0   No current facility-administered medications on file  prior to visit.      Objective:  Objective  Physical Exam  Constitutional: He is oriented to person, place, and time. Vital signs are normal. He appears well-developed and well-nourished. He is sleeping.  HENT:  Head: Normocephalic and atraumatic.  Mouth/Throat: Oropharynx is clear and moist.  Eyes: Pupils are equal, round, and reactive to light. EOM are normal.  Neck: Normal range of motion. Neck supple. No thyromegaly present.  Cardiovascular: Normal  rate and regular rhythm.  No murmur heard. Pulmonary/Chest: Effort normal and breath sounds normal. No respiratory distress. He has no wheezes. He has no rales. He exhibits no tenderness.  Musculoskeletal: He exhibits tenderness. He exhibits no edema.       Right shoulder: He exhibits decreased range of motion and tenderness.  Neurological: He is alert and oriented to person, place, and time.  Skin: Skin is warm and dry.  Psychiatric: He has a normal mood and affect. His behavior is normal. Judgment and thought content normal.  Nursing note and vitals reviewed.  BP (!) 150/76   Pulse 64   Temp 98.6 F (37 C) (Oral)   Resp 16   Ht 6' (1.829 m)   Wt 216 lb (98 kg)   SpO2 99%   BMI 29.29 kg/m  Wt Readings from Last 3 Encounters:  11/04/18 216 lb (98 kg)  09/28/18 220 lb (99.8 kg)  08/03/18 222 lb (100.7 kg)     Lab Results  Component Value Date   WBC 7.6 07/08/2018   HGB 15.5 07/08/2018   HCT 44.5 07/08/2018   PLT 156.0 07/08/2018   GLUCOSE 211 (H) 12/17/2017   CHOL 196 12/17/2017   TRIG 253.0 (H) 12/17/2017   HDL 32.50 (L) 12/17/2017   LDLDIRECT 135.0 12/17/2017   LDLCALC 139 (H) 06/12/2016   ALT 22 12/17/2017   AST 15 12/17/2017   NA 137 12/17/2017   K 4.0 12/17/2017   CL 102 12/17/2017   CREATININE 0.86 12/17/2017   BUN 14 12/17/2017   CO2 28 12/17/2017   TSH 0.97 10/25/2012   PSA 1.32 09/14/2017   HGBA1C 8.4 (H) 12/17/2017   MICROALBUR 1.2 02/26/2016    Dg Shoulder Right  Result Date: 09/28/2018 CLINICAL DATA:  Fall with injury to the right shoulder EXAM: RIGHT SHOULDER - 2+ VIEW COMPARISON:  None. FINDINGS: No fracture or malalignment. Mild AC joint degenerative change. Small cysts in the humeral head. IMPRESSION: No acute osseous abnormality Electronically Signed   By: Donavan Foil M.D.   On: 09/28/2018 17:33   Dg Humerus Right  Result Date: 09/28/2018 CLINICAL DATA:  Fall with pain EXAM: RIGHT HUMERUS - 2+ VIEW COMPARISON:  None. FINDINGS: There is no  evidence of fracture or other focal bone lesions. Soft tissues are unremarkable. IMPRESSION: Negative. Electronically Signed   By: Donavan Foil M.D.   On: 09/28/2018 17:34     Assessment & Plan:  Plan  I have discontinued Prinston D. Mcfadden's BAYER MICROLET 2 LANCING DEVIC, simvastatin, sitaGLIPtin, atorvastatin, and CONTOUR BLOOD GLUCOSE SYSTEM. I am also having him start on tiZANidine. Additionally, I am having him maintain his Ibuprofen, BAYER CONTOUR TEST, BAYER MICROLET LANCETS, piroxicam, lisinopril, sertraline, ondansetron, gabapentin, metFORMIN, oxyCODONE-acetaminophen, and omeprazole.  Meds ordered this encounter  Medications  . omeprazole (PRILOSEC) 40 MG capsule    Sig: TAKE 1 CAPSULE (40 MG TOTAL) BY MOUTH DAILY.    Dispense:  30 capsule    Refill:  3  . tiZANidine (ZANAFLEX) 4 MG tablet    Sig:  Take 1 tablet (4 mg total) by mouth every 6 (six) hours as needed for muscle spasms.    Dispense:  30 tablet    Refill:  1    Problem List Items Addressed This Visit      Unprioritized   Acute pain of right shoulder    Refill pain med and refer to sport med      Relevant Medications   tiZANidine (ZANAFLEX) 4 MG tablet   Other Relevant Orders   Ambulatory referral to Sports Medicine   DM (diabetes mellitus) type II uncontrolled, periph vascular disorder ([place])   Relevant Orders   Hemoglobin A1c   Essential hypertension    Poorly controlled but pt is in a lot of pain, encouraged DASH diet, minimize caffeine and obtain adequate sleep. Report concerning symptoms and follow up as directed and as needed      Relevant Orders   Comprehensive metabolic panel   Gastroesophageal reflux disease   Relevant Medications   omeprazole (PRILOSEC) 40 MG capsule   Hyperlipidemia associated with type 2 diabetes mellitus ([place])   Relevant Orders   Lipid panel   Neck pain - Primary   NECK PAIN, CHRONIC    Pt has no ins and has not been able to get surgery or see specialist He has been in a  lot of pain He is trying to get disability          Follow-up: Return in about 6 months (around 05/05/2019), or if symptoms worsen or fail to improve.  [name], DO

## 2018-11-05 ENCOUNTER — Encounter: Payer: Self-pay | Admitting: Family Medicine

## 2018-11-05 ENCOUNTER — Ambulatory Visit (INDEPENDENT_AMBULATORY_CARE_PROVIDER_SITE_OTHER): Payer: Self-pay | Admitting: Family Medicine

## 2018-11-05 ENCOUNTER — Ambulatory Visit: Payer: Self-pay

## 2018-11-05 VITALS — BP 152/82 | HR 56 | Ht 71.0 in | Wt 216.0 lb

## 2018-11-05 DIAGNOSIS — M25511 Pain in right shoulder: Secondary | ICD-10-CM

## 2018-11-05 DIAGNOSIS — S4991XA Unspecified injury of right shoulder and upper arm, initial encounter: Secondary | ICD-10-CM

## 2018-11-05 NOTE — Patient Instructions (Addendum)
You have torn your rotator cuff. We will go ahead with an MRI to confirm the extent of this and see how far back the muscle has retracted. Continue your current medications as you have been. A sling is usually not helpful but you can consider this if it helps with your pain. We will likely refer you to ortho following the MRI results.

## 2018-11-05 NOTE — Progress Notes (Signed)
PCP and consultation requested by: [name], DO  Subjective:   HPI: Patient is a 62 y.o. male here for right shoulder pain.  Patient reports about 5 weeks ago he was carrying the trash down some steps when he fell down onto right shoulder on the trash bag. He felt and heard a pop in right shoulder with severe pain, though he broke something. Lot of bruising after this but not much swelling. Pain has continued at 7/10 level, sharp. Cannot raise arm or sleep on right side. He is right handed. No numbness. He's taking oxycodone and gabapentin.  Past Medical History:  Diagnosis Date  . Anxiety   . Arthritis   . Depression   . Diabetes mellitus   . GERD (gastroesophageal reflux disease)   . Hyperlipidemia   . Hypertension   . Neuromuscular disorder ([place])    NEUROPATHY  . Sleep apnea     Current Outpatient Medications on File Prior to Visit  Medication Sig Dispense Refill  . BAYER CONTOUR TEST test strip Check Blood sugar once daily. Dx E11.9 50 each 12  . BAYER MICROLET LANCETS lancets Check Blood sugar once daily. Dx E11.9 50 each 11  . gabapentin (NEURONTIN) 300 MG capsule Take 1 capsule (300 mg total) by mouth 3 (three) times daily. 270 capsule 1  . Ibuprofen 200 MG CAPS Take by mouth. Patient takes 5-20 tablets daily.    Marland Kitchen lisinopril (PRINIVIL,ZESTRIL) 5 MG tablet TAKE 1 TABLET (5 MG TOTAL) BY MOUTH DAILY. 90 tablet 0  . metFORMIN (GLUCOPHAGE-XR) 500 MG 24 hr tablet TAKE TWO TABLETS BY MOUTH DAILY 180 tablet 1  . omeprazole (PRILOSEC) 40 MG capsule TAKE 1 CAPSULE (40 MG TOTAL) BY MOUTH DAILY. 30 capsule 3  . ondansetron (ZOFRAN) 4 MG tablet Take 1 tablet (4 mg total) by mouth every 8 (eight) hours as needed. 20 tablet 0  . oxyCODONE-acetaminophen (ROXICET) 5-325 MG tablet Take 1 tablet by mouth every 8 (eight) hours as needed for severe pain. 90 tablet 0  . piroxicam (FELDENE) 10 MG capsule Take 1 capsule (10 mg total) by mouth daily. 30 capsule 2  . sertraline  (ZOLOFT) 100 MG tablet TAKE 1 TABLET (100 MG TOTAL) BY MOUTH DAILY. 180 tablet 0  . tiZANidine (ZANAFLEX) 4 MG tablet Take 1 tablet (4 mg total) by mouth every 6 (six) hours as needed for muscle spasms. 30 tablet 1   No current facility-administered medications on file prior to visit.     Past Surgical History:  Procedure Laterality Date  . APPENDECTOMY    . CHOLECYSTECTOMY    . LUMBAR LAMINECTOMY      Allergies  Allergen Reactions  . Niacin     REACTION: rash    Social History   Socioeconomic History  . Marital status: Married    Spouse name: Lorriane Shire  . Number of children: 2  . Years of education: Not on file  . Highest education level: Not on file  Occupational History  . Occupation: self employed    Fish farm manager: UNEMPLOYED  Social Needs  . Financial resource strain: Not on file  . Food insecurity:    Worry: Not on file    Inability: Not on file  . Transportation needs:    Medical: Not on file    Non-medical: Not on file  Tobacco Use  . Smoking status: Former Smoker    Years: 16.00    Types: Cigarettes    Last attempt to quit: 12/29/1985    Years  since quitting: 32.8  . Smokeless tobacco: Never Used  Substance and Sexual Activity  . Alcohol use: Yes    Alcohol/week: 4.0 - 5.0 standard drinks    Types: 4 - 5 Cans of beer per week    Comment: daily - 1-2 beers  . Drug use: No  . Sexual activity: Yes    Partners: Female  Lifestyle  . Physical activity:    Days per week: Not on file    Minutes per session: Not on file  . Stress: Not on file  Relationships  . Social connections:    Talks on phone: Not on file    Gets together: Not on file    Attends religious service: Not on file    Active member of club or organization: Not on file    Attends meetings of clubs or organizations: Not on file    Relationship status: Not on file  . Intimate partner violence:    Fear of current or ex partner: Not on file    Emotionally abused: Not on file    Physically abused:  Not on file    Forced sexual activity: Not on file  Other Topics Concern  . Not on file  Social History Narrative   Exercise-- 3 days   Pt has hs degree    Family History  Problem Relation Age of Onset  . Ovarian cancer Sister   . Stomach cancer Sister   . Heart disease Sister        MI  . Heart disease Sister        MI  . Alcohol abuse Unknown   . Depression Unknown   . Arthritis Unknown   . Hypertension Unknown   . Stomach cancer Maternal Grandmother   . COPD Mother   . Stroke Father   . Coronary artery disease Unknown   . Ovarian cancer Unknown        neice  . Ovarian cancer Unknown        neice  . Colon cancer Unknown     BP (!) 152/82   Pulse (!) 56   Ht 5\' 11"  (1.803 m)   Wt 216 lb (98 kg)   BMI 30.13 kg/m   Review of Systems: See HPI above.     Objective:  Physical Exam:  Gen: NAD, comfortable in exam room  Right shoulder: No swelling, ecchymoses.  No gross deformity. No TTP AC joint.  Mild tenderness over proximal biceps tendon but no popeye deformity. Full IR.  Full passive ER but only to 30 degrees actively.  Flexion to 40 degrees, abduction to 70. Positive Hawkins, Neers. Negative Yergasons. Strength 5/5 IR, 4/5 ER, 2/5 empty can. NV intact distally.  Left shoulder: No swelling, ecchymoses.  No gross deformity. No TTP. FROM. Strength 5/5 with empty can and resisted internal/external rotation. NV intact distally.   MSK u/s right shoulder:  Biceps tendon intact with moderate tenosynovitis.  Subscapularis appears normal without evidence tear.  AC joint with mild arthropathy.  Infraspinatus very thin distally with thickening ~2 cm proximally suggestive of at least partial thickness tear with retraction.  Supraspinatus is not visualized consistent with full thickness retracted tear.  Assessment & Plan:  1. Right shoulder injury - Consistent with full thickness supraspinatus tear with retraction and at least partial thickness infraspinatus tear  with retraction.  We will go ahead with MRI to further assess extent and likely refer to ortho.  Continue his gabapentin, percocet.

## 2018-11-06 ENCOUNTER — Ambulatory Visit (HOSPITAL_BASED_OUTPATIENT_CLINIC_OR_DEPARTMENT_OTHER)
Admission: RE | Admit: 2018-11-06 | Discharge: 2018-11-06 | Disposition: A | Discharge status: Home / Self Care | Payer: Self-pay | Source: Ambulatory Visit | Attending: Family Medicine | Admitting: Family Medicine

## 2018-11-06 DIAGNOSIS — M25511 Pain in right shoulder: Secondary | ICD-10-CM

## 2018-11-06 DIAGNOSIS — M2540 Effusion, unspecified joint: Secondary | ICD-10-CM | POA: Insufficient documentation

## 2018-11-06 DIAGNOSIS — M12811 Other specific arthropathies, not elsewhere classified, right shoulder: Secondary | ICD-10-CM | POA: Insufficient documentation

## 2018-11-15 ENCOUNTER — Encounter (HOSPITAL_BASED_OUTPATIENT_CLINIC_OR_DEPARTMENT_OTHER): Payer: Self-pay

## 2018-11-15 ENCOUNTER — Other Ambulatory Visit: Payer: Self-pay

## 2018-11-16 ENCOUNTER — Other Ambulatory Visit: Payer: Self-pay

## 2018-11-16 MED ORDER — ATORVASTATIN CALCIUM 20 MG PO TABS: 20.0000 mg | ORAL_TABLET | Freq: Every day | ORAL | 2 refills | Status: DC

## 2018-11-16 MED ORDER — GLIMEPIRIDE 2 MG PO TABS: 2.0000 mg | ORAL_TABLET | Freq: Every day | ORAL | 2 refills | Status: DC

## 2018-11-18 ENCOUNTER — Other Ambulatory Visit: Payer: Self-pay | Admitting: *Deleted

## 2018-11-18 ENCOUNTER — Encounter (HOSPITAL_BASED_OUTPATIENT_CLINIC_OR_DEPARTMENT_OTHER)
Admission: RE | Admit: 2018-11-18 | Discharge: 2018-11-18 | Disposition: A | Discharge status: Home / Self Care | Payer: Medicaid Other | Source: Ambulatory Visit | Attending: Orthopedic Surgery | Admitting: Orthopedic Surgery

## 2018-11-18 ENCOUNTER — Other Ambulatory Visit: Payer: Self-pay | Admitting: Orthopedic Surgery

## 2018-11-18 DIAGNOSIS — E1165 Type 2 diabetes mellitus with hyperglycemia: Principal | ICD-10-CM

## 2018-11-18 DIAGNOSIS — E119 Type 2 diabetes mellitus without complications: Secondary | ICD-10-CM | POA: Insufficient documentation

## 2018-11-18 DIAGNOSIS — E785 Hyperlipidemia, unspecified: Secondary | ICD-10-CM

## 2018-11-18 DIAGNOSIS — Z01810 Encounter for preprocedural cardiovascular examination: Secondary | ICD-10-CM | POA: Insufficient documentation

## 2018-11-18 DIAGNOSIS — E1151 Type 2 diabetes mellitus with diabetic peripheral angiopathy without gangrene: Secondary | ICD-10-CM

## 2018-11-18 DIAGNOSIS — E1169 Type 2 diabetes mellitus with other specified complication: Secondary | ICD-10-CM

## 2018-11-18 DIAGNOSIS — IMO0002 Reserved for concepts with insufficient information to code with codable children: Secondary | ICD-10-CM

## 2018-11-18 DIAGNOSIS — I10 Essential (primary) hypertension: Secondary | ICD-10-CM | POA: Insufficient documentation

## 2018-11-19 ENCOUNTER — Telehealth: Payer: Self-pay

## 2018-11-19 NOTE — Telephone Encounter (Signed)
Copied from [facility] [phone]. Topic: General - Other >> Nov 19, 2018  2:14 PM Oneta Rack wrote: Osvaldo Human name: Judeen Hammans or Claiborne Billings  Relation to pt: [facility] Dr. Mardelle Matte  Call back number: [phone] / [phone] direct line     Reason for call:  [name], MD [place] office faxed over surgical clearance form to [phone] 11/12/18. Re faxing form today to [phone], please note when received.  Surgery scheduled for Tuesday, 11/23/18

## 2018-11-22 ENCOUNTER — Encounter (HOSPITAL_BASED_OUTPATIENT_CLINIC_OR_DEPARTMENT_OTHER): Payer: Self-pay | Admitting: *Deleted

## 2018-11-22 NOTE — Anesthesia Preprocedure Evaluation (Addendum)
Anesthesia Evaluation  Patient identified by MRN, date of birth, ID band Patient awake    Reviewed: Allergy & Precautions, NPO status , Patient's Chart, lab work & pertinent test results  Airway Mallampati: III  TM Distance: >3 FB Neck ROM: Full    Dental no notable dental hx. (+) Teeth Intact, Dental Advisory Given   Pulmonary neg pulmonary ROS, sleep apnea , former smoker,    Pulmonary exam normal breath sounds clear to auscultation       Cardiovascular Exercise Tolerance: Good hypertension, Pt. on medications Normal cardiovascular exam Rhythm:Regular Rate:Normal     Neuro/Psych PSYCHIATRIC DISORDERS Anxiety Depression  Neuromuscular disease    GI/Hepatic Neg liver ROS, GERD  Medicated,  Endo/Other  diabetes, Type 2  Renal/GU negative Renal ROS     Musculoskeletal negative musculoskeletal ROS (+) Arthritis ,   Abdominal   Peds  Hematology negative hematology ROS (+)   Anesthesia Other Findings   Reproductive/Obstetrics                           Anesthesia Physical Anesthesia Plan  ASA: III  Anesthesia Plan: General   Post-op Pain Management:  Regional for Post-op pain   Induction: Intravenous  PONV Risk Score and Plan: 2 and Treatment may vary due to age or medical condition, Dexamethasone, Ondansetron and Midazolam  Airway Management Planned: Oral ETT  Additional Equipment:   Intra-op Plan:   Post-operative Plan: Extubation in OR  Informed Consent: I have reviewed the patients History and Physical, chart, labs and discussed the procedure including the risks, benefits and alternatives for the proposed anesthesia with the patient or authorized representative who has indicated his/her understanding and acceptance.   Dental advisory given  Plan Discussed with:   Anesthesia Plan Comments: (Plus interscalkene block)        Anesthesia Quick Evaluation

## 2018-11-22 NOTE — Telephone Encounter (Signed)
Received Medical/Surgical Clearance Form from [facility], Dr. Mardelle Matte; forwarded to provider/SLS 11/25

## 2018-11-22 NOTE — Telephone Encounter (Signed)
Surgical Clearance faxed with EKG and Lab results; confirmation received/SLS 11/25

## 2018-11-23 ENCOUNTER — Encounter (HOSPITAL_BASED_OUTPATIENT_CLINIC_OR_DEPARTMENT_OTHER)
Admission: RE | Disposition: A | Discharge status: Home / Self Care | Payer: Self-pay | Source: Ambulatory Visit | Attending: Orthopedic Surgery

## 2018-11-23 ENCOUNTER — Ambulatory Visit (HOSPITAL_BASED_OUTPATIENT_CLINIC_OR_DEPARTMENT_OTHER): Payer: Self-pay | Admitting: Anesthesiology

## 2018-11-23 ENCOUNTER — Encounter (HOSPITAL_BASED_OUTPATIENT_CLINIC_OR_DEPARTMENT_OTHER): Payer: Self-pay | Admitting: Certified Registered Nurse Anesthetist

## 2018-11-23 ENCOUNTER — Other Ambulatory Visit: Payer: Self-pay

## 2018-11-23 ENCOUNTER — Ambulatory Visit (HOSPITAL_BASED_OUTPATIENT_CLINIC_OR_DEPARTMENT_OTHER)
Admission: RE | Admit: 2018-11-23 | Discharge: 2018-11-24 | Disposition: A | Discharge status: Home / Self Care | Payer: Self-pay | Source: Ambulatory Visit | Attending: Orthopedic Surgery | Admitting: Orthopedic Surgery

## 2018-11-23 DIAGNOSIS — I10 Essential (primary) hypertension: Secondary | ICD-10-CM | POA: Insufficient documentation

## 2018-11-23 DIAGNOSIS — S43431A Superior glenoid labrum lesion of right shoulder, initial encounter: Secondary | ICD-10-CM | POA: Insufficient documentation

## 2018-11-23 DIAGNOSIS — S46011A Strain of muscle(s) and tendon(s) of the rotator cuff of right shoulder, initial encounter: Secondary | ICD-10-CM | POA: Insufficient documentation

## 2018-11-23 DIAGNOSIS — E119 Type 2 diabetes mellitus without complications: Secondary | ICD-10-CM | POA: Insufficient documentation

## 2018-11-23 DIAGNOSIS — Z7984 Long term (current) use of oral hypoglycemic drugs: Secondary | ICD-10-CM | POA: Insufficient documentation

## 2018-11-23 DIAGNOSIS — Z87891 Personal history of nicotine dependence: Secondary | ICD-10-CM | POA: Insufficient documentation

## 2018-11-23 DIAGNOSIS — Z791 Long term (current) use of non-steroidal anti-inflammatories (NSAID): Secondary | ICD-10-CM | POA: Insufficient documentation

## 2018-11-23 DIAGNOSIS — E785 Hyperlipidemia, unspecified: Secondary | ICD-10-CM | POA: Insufficient documentation

## 2018-11-23 DIAGNOSIS — M7541 Impingement syndrome of right shoulder: Secondary | ICD-10-CM | POA: Insufficient documentation

## 2018-11-23 DIAGNOSIS — G709 Myoneural disorder, unspecified: Secondary | ICD-10-CM | POA: Insufficient documentation

## 2018-11-23 DIAGNOSIS — Z79899 Other long term (current) drug therapy: Secondary | ICD-10-CM | POA: Insufficient documentation

## 2018-11-23 DIAGNOSIS — F329 Major depressive disorder, single episode, unspecified: Secondary | ICD-10-CM | POA: Insufficient documentation

## 2018-11-23 DIAGNOSIS — K219 Gastro-esophageal reflux disease without esophagitis: Secondary | ICD-10-CM | POA: Insufficient documentation

## 2018-11-23 DIAGNOSIS — W19XXXA Unspecified fall, initial encounter: Secondary | ICD-10-CM | POA: Insufficient documentation

## 2018-11-23 DIAGNOSIS — M75101 Unspecified rotator cuff tear or rupture of right shoulder, not specified as traumatic: Secondary | ICD-10-CM | POA: Diagnosis present

## 2018-11-23 HISTORY — DX: Unspecified rotator cuff tear or rupture of right shoulder, not specified as traumatic: M75.101

## 2018-11-23 HISTORY — PX: ARTHOSCOPIC ROTAOR CUFF REPAIR: SHX5002

## 2018-11-23 HISTORY — DX: Other complications of anesthesia, initial encounter: T88.59XA

## 2018-11-23 HISTORY — PX: SHOULDER ARTHROSCOPY WITH ROTATOR CUFF REPAIR AND SUBACROMIAL DECOMPRESSION: SHX5686

## 2018-11-23 HISTORY — DX: Adverse effect of unspecified anesthetic, initial encounter: T41.45XA

## 2018-11-23 LAB — GLUCOSE, CAPILLARY
GLUCOSE-CAPILLARY: 254 mg/dL — AB (ref 70–99)
GLUCOSE-CAPILLARY: 205 mg/dL — AB (ref 70–99)
Glucose-Capillary: 214 mg/dL — ABNORMAL HIGH (ref 70–99)

## 2018-11-23 SURGERY — SHOULDER ARTHROSCOPY WITH ROTATOR CUFF REPAIR AND SUBACROMIAL DECOMPRESSION
Anesthesia: General | Site: Shoulder | Laterality: Right

## 2018-11-23 MED ORDER — LIDOCAINE 2% (20 MG/ML) 5 ML SYRINGE
INTRAMUSCULAR | Status: AC
  Filled 2018-11-23: qty 5

## 2018-11-23 MED ORDER — SENNA-DOCUSATE SODIUM 8.6-50 MG PO TABS: 2.0000 | ORAL_TABLET | Freq: Every day | ORAL | 1 refills | Status: DC

## 2018-11-23 MED ORDER — ONDANSETRON HCL 4 MG PO TABS: 4.0000 mg | ORAL_TABLET | Freq: Four times a day (QID) | ORAL | Status: DC | PRN

## 2018-11-23 MED ORDER — EPHEDRINE 5 MG/ML INJ
INTRAVENOUS | Status: AC
  Filled 2018-11-23: qty 10

## 2018-11-23 MED ORDER — DOCUSATE SODIUM 100 MG PO CAPS
100.0000 mg | ORAL_CAPSULE | Freq: Two times a day (BID) | ORAL | Status: DC
  Administered 2018-11-23: 100 mg via ORAL
  Filled 2018-11-23: qty 1

## 2018-11-23 MED ORDER — OXYCODONE-ACETAMINOPHEN 5-325 MG PO TABS
1.0000 | ORAL_TABLET | ORAL | Status: DC | PRN
  Administered 2018-11-23 – 2018-11-24 (×3): 2 via ORAL
  Filled 2018-11-23 (×3): qty 2

## 2018-11-23 MED ORDER — MIDAZOLAM HCL 2 MG/2ML IJ SOLN
INTRAMUSCULAR | Status: AC
  Filled 2018-11-23: qty 2

## 2018-11-23 MED ORDER — METHOCARBAMOL 1000 MG/10ML IJ SOLN: 500.0000 mg | Freq: Four times a day (QID) | INTRAVENOUS | Status: DC | PRN

## 2018-11-23 MED ORDER — EPHEDRINE SULFATE-NACL 50-0.9 MG/10ML-% IV SOSY
PREFILLED_SYRINGE | INTRAVENOUS | Status: DC | PRN
  Administered 2018-11-23 (×2): 10 mg via INTRAVENOUS

## 2018-11-23 MED ORDER — LISINOPRIL 5 MG PO TABS
5.0000 mg | ORAL_TABLET | Freq: Every day | ORAL | Status: DC
  Administered 2018-11-23: 5 mg via ORAL

## 2018-11-23 MED ORDER — CEFAZOLIN SODIUM-DEXTROSE 1-4 GM/50ML-% IV SOLN
1.0000 g | Freq: Four times a day (QID) | INTRAVENOUS | Status: AC
  Administered 2018-11-23 – 2018-11-24 (×3): 1 g via INTRAVENOUS
  Filled 2018-11-23 (×3): qty 50

## 2018-11-23 MED ORDER — BISACODYL 10 MG RE SUPP: 10.0000 mg | Freq: Every day | RECTAL | Status: DC | PRN

## 2018-11-23 MED ORDER — PHENYLEPHRINE 40 MCG/ML (10ML) SYRINGE FOR IV PUSH (FOR BLOOD PRESSURE SUPPORT)
PREFILLED_SYRINGE | INTRAVENOUS | Status: AC
  Filled 2018-11-23: qty 10

## 2018-11-23 MED ORDER — DEXAMETHASONE SODIUM PHOSPHATE 10 MG/ML IJ SOLN
INTRAMUSCULAR | Status: AC
  Filled 2018-11-23: qty 1

## 2018-11-23 MED ORDER — METOCLOPRAMIDE HCL 5 MG/ML IJ SOLN: 5.0000 mg | Freq: Three times a day (TID) | INTRAMUSCULAR | Status: DC | PRN

## 2018-11-23 MED ORDER — FENTANYL CITRATE (PF) 100 MCG/2ML IJ SOLN
INTRAMUSCULAR | Status: AC
  Filled 2018-11-23: qty 2

## 2018-11-23 MED ORDER — ATORVASTATIN CALCIUM 20 MG PO TABS
20.0000 mg | ORAL_TABLET | Freq: Every day | ORAL | Status: DC
  Administered 2018-11-23: 20 mg via ORAL

## 2018-11-23 MED ORDER — METFORMIN HCL ER 500 MG PO TB24
1000.0000 mg | ORAL_TABLET | Freq: Every day | ORAL | Status: DC
  Administered 2018-11-23: 1000 mg via ORAL

## 2018-11-23 MED ORDER — ONDANSETRON HCL 4 MG/2ML IJ SOLN
INTRAMUSCULAR | Status: DC | PRN
  Administered 2018-11-23: 4 mg via INTRAVENOUS

## 2018-11-23 MED ORDER — MIDAZOLAM HCL 2 MG/2ML IJ SOLN
1.0000 mg | INTRAMUSCULAR | Status: DC | PRN
  Administered 2018-11-23: 1 mg via INTRAVENOUS

## 2018-11-23 MED ORDER — OXYCODONE HCL 5 MG PO TABS
5.0000 mg | ORAL_TABLET | ORAL | Status: DC | PRN
  Administered 2018-11-24: 10 mg via ORAL
  Filled 2018-11-23: qty 2

## 2018-11-23 MED ORDER — ZOLPIDEM TARTRATE 5 MG PO TABS: 5.0000 mg | ORAL_TABLET | Freq: Every evening | ORAL | Status: DC | PRN

## 2018-11-23 MED ORDER — GLIMEPIRIDE 2 MG PO TABS: 2.0000 mg | ORAL_TABLET | Freq: Every day | ORAL | Status: DC

## 2018-11-23 MED ORDER — POLYETHYLENE GLYCOL 3350 17 G PO PACK: 17.0000 g | PACK | Freq: Every day | ORAL | Status: DC | PRN

## 2018-11-23 MED ORDER — METHOCARBAMOL 500 MG PO TABS
500.0000 mg | ORAL_TABLET | Freq: Four times a day (QID) | ORAL | Status: DC | PRN
  Administered 2018-11-23 – 2018-11-24 (×2): 500 mg via ORAL
  Filled 2018-11-23 (×2): qty 1

## 2018-11-23 MED ORDER — GABAPENTIN 300 MG PO CAPS
ORAL_CAPSULE | ORAL | Status: AC
  Filled 2018-11-23: qty 1

## 2018-11-23 MED ORDER — SUGAMMADEX SODIUM 500 MG/5ML IV SOLN
INTRAVENOUS | Status: AC
  Filled 2018-11-23: qty 5

## 2018-11-23 MED ORDER — ROPIVACAINE HCL 5 MG/ML IJ SOLN
INTRAMUSCULAR | Status: DC | PRN
  Administered 2018-11-23: 30 mL via PERINEURAL

## 2018-11-23 MED ORDER — INSULIN ASPART 100 UNIT/ML ~~LOC~~ SOLN
SUBCUTANEOUS | Status: AC
  Filled 2018-11-23: qty 1

## 2018-11-23 MED ORDER — PANTOPRAZOLE SODIUM 40 MG PO TBEC
80.0000 mg | DELAYED_RELEASE_TABLET | Freq: Every day | ORAL | Status: DC
  Administered 2018-11-23: 80 mg via ORAL

## 2018-11-23 MED ORDER — CEFAZOLIN SODIUM-DEXTROSE 2-4 GM/100ML-% IV SOLN
2.0000 g | INTRAVENOUS | Status: AC
  Administered 2018-11-23: 2 g via INTRAVENOUS

## 2018-11-23 MED ORDER — INSULIN ASPART 100 UNIT/ML ~~LOC~~ SOLN
5.0000 [IU] | Freq: Once | SUBCUTANEOUS | Status: AC
  Administered 2018-11-23: 5 [IU] via SUBCUTANEOUS

## 2018-11-23 MED ORDER — ROCURONIUM BROMIDE 50 MG/5ML IV SOSY
PREFILLED_SYRINGE | INTRAVENOUS | Status: DC | PRN
  Administered 2018-11-23: 50 mg via INTRAVENOUS

## 2018-11-23 MED ORDER — HYDROMORPHONE HCL 2 MG PO TABS: 2.0000 mg | ORAL_TABLET | ORAL | 0 refills | Status: DC | PRN

## 2018-11-23 MED ORDER — OXYCODONE HCL 5 MG PO TABS: 5.0000 mg | ORAL_TABLET | ORAL | 0 refills | Status: DC | PRN

## 2018-11-23 MED ORDER — SODIUM CHLORIDE 0.9 % IV SOLN
INTRAVENOUS | Status: DC
  Administered 2018-11-23: 15:00:00 via INTRAVENOUS

## 2018-11-23 MED ORDER — ROCURONIUM BROMIDE 50 MG/5ML IV SOSY
PREFILLED_SYRINGE | INTRAVENOUS | Status: AC
  Filled 2018-11-23: qty 5

## 2018-11-23 MED ORDER — SENNA 8.6 MG PO TABS
1.0000 | ORAL_TABLET | Freq: Two times a day (BID) | ORAL | Status: DC
  Administered 2018-11-23: 8.6 mg via ORAL
  Filled 2018-11-23: qty 1

## 2018-11-23 MED ORDER — DEXAMETHASONE SODIUM PHOSPHATE 10 MG/ML IJ SOLN
8.0000 mg | Freq: Once | INTRAMUSCULAR | Status: AC
  Administered 2018-11-23: 8 mg via INTRAVENOUS

## 2018-11-23 MED ORDER — PHENYLEPHRINE HCL 10 MG/ML IJ SOLN
INTRAMUSCULAR | Status: AC
  Filled 2018-11-23: qty 2

## 2018-11-23 MED ORDER — HYDROMORPHONE HCL 1 MG/ML IJ SOLN
0.5000 mg | INTRAMUSCULAR | Status: DC | PRN
  Administered 2018-11-24: 0.5 mg via INTRAVENOUS
  Administered 2018-11-24: 1 mg via INTRAVENOUS
  Administered 2018-11-24: 0.5 mg via INTRAVENOUS

## 2018-11-23 MED ORDER — FENTANYL CITRATE (PF) 100 MCG/2ML IJ SOLN
50.0000 ug | INTRAMUSCULAR | Status: DC | PRN
  Administered 2018-11-23: 50 ug via INTRAVENOUS

## 2018-11-23 MED ORDER — METOCLOPRAMIDE HCL 5 MG PO TABS: 5.0000 mg | ORAL_TABLET | Freq: Three times a day (TID) | ORAL | Status: DC | PRN

## 2018-11-23 MED ORDER — PHENYLEPHRINE 40 MCG/ML (10ML) SYRINGE FOR IV PUSH (FOR BLOOD PRESSURE SUPPORT)
PREFILLED_SYRINGE | INTRAVENOUS | Status: DC | PRN
  Administered 2018-11-23: 40 ug via INTRAVENOUS
  Administered 2018-11-23 (×3): 80 ug via INTRAVENOUS
  Administered 2018-11-23: 120 ug via INTRAVENOUS

## 2018-11-23 MED ORDER — LIDOCAINE 2% (20 MG/ML) 5 ML SYRINGE
INTRAMUSCULAR | Status: DC | PRN
  Administered 2018-11-23: 80 mg via INTRAVENOUS

## 2018-11-23 MED ORDER — SUGAMMADEX SODIUM 200 MG/2ML IV SOLN
INTRAVENOUS | Status: AC
  Filled 2018-11-23: qty 2

## 2018-11-23 MED ORDER — PROPOFOL 500 MG/50ML IV EMUL
INTRAVENOUS | Status: AC
  Filled 2018-11-23: qty 50

## 2018-11-23 MED ORDER — MAGNESIUM CITRATE PO SOLN: 1.0000 | Freq: Once | ORAL | Status: DC | PRN

## 2018-11-23 MED ORDER — SCOPOLAMINE 1 MG/3DAYS TD PT72: 1.0000 | MEDICATED_PATCH | Freq: Once | TRANSDERMAL | Status: DC | PRN

## 2018-11-23 MED ORDER — ONDANSETRON HCL 4 MG/2ML IJ SOLN
INTRAMUSCULAR | Status: AC
  Filled 2018-11-23: qty 2

## 2018-11-23 MED ORDER — SUGAMMADEX SODIUM 200 MG/2ML IV SOLN
INTRAVENOUS | Status: DC | PRN
  Administered 2018-11-23: 200 mg via INTRAVENOUS

## 2018-11-23 MED ORDER — LACTATED RINGERS IV SOLN
INTRAVENOUS | Status: DC
  Administered 2018-11-23 (×2): via INTRAVENOUS

## 2018-11-23 MED ORDER — GABAPENTIN 300 MG PO CAPS: 300.0000 mg | ORAL_CAPSULE | Freq: Once | ORAL | Status: DC

## 2018-11-23 MED ORDER — GABAPENTIN 300 MG PO CAPS
300.0000 mg | ORAL_CAPSULE | Freq: Three times a day (TID) | ORAL | Status: DC
  Administered 2018-11-23 (×2): 300 mg via ORAL
  Filled 2018-11-23: qty 1

## 2018-11-23 MED ORDER — PROPOFOL 10 MG/ML IV BOLUS
INTRAVENOUS | Status: DC | PRN
  Administered 2018-11-23: 160 mg via INTRAVENOUS

## 2018-11-23 MED ORDER — ACETAMINOPHEN 500 MG PO TABS
ORAL_TABLET | ORAL | Status: AC
  Filled 2018-11-23: qty 2

## 2018-11-23 MED ORDER — ONDANSETRON HCL 4 MG/2ML IJ SOLN
4.0000 mg | Freq: Four times a day (QID) | INTRAMUSCULAR | Status: DC | PRN
  Administered 2018-11-24: 4 mg via INTRAVENOUS
  Filled 2018-11-23: qty 2

## 2018-11-23 MED ORDER — ACETAMINOPHEN 500 MG PO TABS: 1000.0000 mg | ORAL_TABLET | Freq: Once | ORAL | Status: DC

## 2018-11-23 MED ORDER — SODIUM CHLORIDE 0.9 % IR SOLN
Status: DC | PRN
  Administered 2018-11-23: 3000 mL

## 2018-11-23 MED ORDER — CEFAZOLIN SODIUM-DEXTROSE 2-4 GM/100ML-% IV SOLN
INTRAVENOUS | Status: AC
  Filled 2018-11-23: qty 100

## 2018-11-23 MED ORDER — SODIUM CHLORIDE 0.9 % IV SOLN
INTRAVENOUS | Status: DC | PRN
  Administered 2018-11-23: 30 ug/min via INTRAVENOUS

## 2018-11-23 MED ORDER — TIZANIDINE HCL 4 MG PO TABS: 4.0000 mg | ORAL_TABLET | Freq: Four times a day (QID) | ORAL | Status: DC | PRN

## 2018-11-23 MED ORDER — SERTRALINE HCL 100 MG PO TABS
100.0000 mg | ORAL_TABLET | Freq: Every day | ORAL | Status: DC
  Administered 2018-11-23: 100 mg via ORAL

## 2018-11-23 MED FILL — STOOL SOFTENER-LAXATIVE TAB: 50-8.6 | 50 days supply | Qty: 100 | Fill #0

## 2018-11-23 SURGICAL SUPPLY — 70 items
ANCH SUT SWLK 19.1X4.75 (Anchor) ×2 IMPLANT
ANCHOR SUT BIO SW 4.75X19.1 (Anchor) ×2 IMPLANT
BLADE CUTTER GATOR 3.5 (BLADE) ×4 IMPLANT
BLADE GREAT WHITE 4.2 (BLADE) IMPLANT
BLADE GREAT WHITE 4.2MM (BLADE)
BLADE SURG 15 STRL LF DISP TIS (BLADE) IMPLANT
BLADE SURG 15 STRL SS (BLADE)
BUR OVAL 6.0 (BURR) IMPLANT
CANNULA 5.75X71 LONG (CANNULA) ×4 IMPLANT
CANNULA TWIST IN 8.25X7CM (CANNULA) ×2 IMPLANT
CLOSURE STERI-STRIP 1/2X4 (GAUZE/BANDAGES/DRESSINGS) ×1
CLSR STERI-STRIP ANTIMIC 1/2X4 (GAUZE/BANDAGES/DRESSINGS) ×3 IMPLANT
COVER WAND RF STERILE (DRAPES) IMPLANT
DECANTER SPIKE VIAL GLASS SM (MISCELLANEOUS) IMPLANT
DRAPE IMP U-DRAPE 54X76 (DRAPES) ×4 IMPLANT
DRAPE INCISE IOBAN 66X45 STRL (DRAPES) ×4 IMPLANT
DRAPE SHOULDER BEACH CHAIR (DRAPES) ×4 IMPLANT
DRAPE SURG 17X23 STRL (DRAPES) ×4 IMPLANT
DRAPE U-SHAPE 47X51 STRL (DRAPES) ×4 IMPLANT
DRSG PAD ABDOMINAL 8X10 ST (GAUZE/BANDAGES/DRESSINGS) ×4 IMPLANT
DURAPREP 26ML APPLICATOR (WOUND CARE) ×6 IMPLANT
ELECT REM PT RETURN 9FT ADLT (ELECTROSURGICAL)
ELECTRODE REM PT RTRN 9FT ADLT (ELECTROSURGICAL) IMPLANT
FIBERSTICK 2 (SUTURE) IMPLANT
GAUZE SPONGE 4X4 12PLY STRL (GAUZE/BANDAGES/DRESSINGS) ×4 IMPLANT
GLOVE BIO SURGEON STRL SZ8 (GLOVE) ×4 IMPLANT
GLOVE BIOGEL PI IND STRL 7.0 (GLOVE) IMPLANT
GLOVE BIOGEL PI IND STRL 8 (GLOVE) ×4 IMPLANT
GLOVE BIOGEL PI INDICATOR 7.0 (GLOVE) ×2
GLOVE BIOGEL PI INDICATOR 8 (GLOVE) ×4
GLOVE ECLIPSE 6.5 STRL STRAW (GLOVE) ×2 IMPLANT
GLOVE ORTHO TXT STRL SZ7.5 (GLOVE) ×6 IMPLANT
GOWN STRL REUS W/ TWL LRG LVL3 (GOWN DISPOSABLE) ×2 IMPLANT
GOWN STRL REUS W/ TWL XL LVL3 (GOWN DISPOSABLE) ×4 IMPLANT
GOWN STRL REUS W/TWL LRG LVL3 (GOWN DISPOSABLE) ×4
GOWN STRL REUS W/TWL XL LVL3 (GOWN DISPOSABLE) ×8
IMMOBILIZER SHOULDER FOAM XLGE (SOFTGOODS) ×2 IMPLANT
IMPL SPEEDBRIDGE KIT (Orthopedic Implant) IMPLANT
IMPLANT SPEEDBRIDGE KIT (Orthopedic Implant) ×4 IMPLANT
LASSO 90 CVE QUICKPAS (DISPOSABLE) IMPLANT
MANIFOLD NEPTUNE II (INSTRUMENTS) ×4 IMPLANT
NDL SCORPION MULTI FIRE (NEEDLE) ×2 IMPLANT
NEEDLE SCORPION MULTI FIRE (NEEDLE) IMPLANT
PACK ARTHROSCOPY DSU (CUSTOM PROCEDURE TRAY) ×4 IMPLANT
PACK BASIN DAY SURGERY FS (CUSTOM PROCEDURE TRAY) ×4 IMPLANT
PROBE BIPOLAR ATHRO 135MM 90D (MISCELLANEOUS) ×4 IMPLANT
SHEET MEDIUM DRAPE 40X70 STRL (DRAPES) ×4 IMPLANT
SLEEVE SCD COMPRESS KNEE MED (MISCELLANEOUS) ×4 IMPLANT
SLING ARM FOAM STRAP LRG (SOFTGOODS) IMPLANT
SLING ARM IMMOBILIZER LRG (SOFTGOODS) IMPLANT
SLING ARM IMMOBILIZER MED (SOFTGOODS) IMPLANT
SLING ARM MED ADULT FOAM STRAP (SOFTGOODS) IMPLANT
SLING ARM XL FOAM STRAP (SOFTGOODS) ×2 IMPLANT
SUPPORT WRAP ARM LG (MISCELLANEOUS) ×4 IMPLANT
SUT FIBERWIRE #2 38 T-5 BLUE (SUTURE)
SUT MNCRL AB 4-0 PS2 18 (SUTURE) ×4 IMPLANT
SUT PDS AB 0 CT 36 (SUTURE) ×4 IMPLANT
SUT PDS AB 1 CT  36 (SUTURE)
SUT PDS AB 1 CT 36 (SUTURE) IMPLANT
SUT TIGER TAPE 7 IN WHITE (SUTURE) ×2 IMPLANT
SUT VIC AB 3-0 SH 27 (SUTURE)
SUT VIC AB 3-0 SH 27X BRD (SUTURE) IMPLANT
SUTURE FIBERWR #2 38 T-5 BLUE (SUTURE) IMPLANT
TAPE FIBER 2MM 7IN #2 BLUE (SUTURE) IMPLANT
TOWEL GREEN STERILE FF (TOWEL DISPOSABLE) ×4 IMPLANT
TOWEL OR NON WOVEN STRL DISP B (DISPOSABLE) ×2 IMPLANT
TUBE CONNECTING 20'X1/4 (TUBING) ×1
TUBE CONNECTING 20X1/4 (TUBING) ×3 IMPLANT
TUBING ARTHRO INFLOW-ONLY STRL (TUBING) ×4 IMPLANT
WATER STERILE IRR 1000ML POUR (IV SOLUTION) ×4 IMPLANT

## 2018-11-23 NOTE — H&P (Signed)
PREOPERATIVE H&P  Chief Complaint: right shoulder pain  HPI: Ronald Petersen is a 62 y.o. male who presents for preoperative history and physical with a diagnosis of massive right shoulder rotator cuff tear. Symptoms are rated as moderate to severe, and have been worsening.  This is significantly impairing activities of daily living.  He has elected for surgical management.  He fell on his right side approximately 8 weeks ago, had acute onset loss of function.  He has not been able to work.  He works on [employer] and does a lot of overhead work.  Past Medical History:  Diagnosis Date  . Anxiety   . Arthritis   . Complication of anesthesia    aspirated with back surgery at age 78  . Depression   . Diabetes mellitus   . GERD (gastroesophageal reflux disease)   . Hyperlipidemia   . Hypertension   . Neuromuscular disorder ([place])    NEUROPATHY  . Sleep apnea    no CPAP   Past Surgical History:  Procedure Laterality Date  . APPENDECTOMY    . CHOLECYSTECTOMY    . LUMBAR LAMINECTOMY     Social History   Socioeconomic History  . Marital status: Married    Spouse name: Lorriane Shire  . Number of children: 2  . Years of education: Not on file  . Highest education level: Not on file  Occupational History  . Occupation: self employed    Fish farm manager: UNEMPLOYED  Social Needs  . Financial resource strain: Not on file  . Food insecurity:    Worry: Not on file    Inability: Not on file  . Transportation needs:    Medical: Not on file    Non-medical: Not on file  Tobacco Use  . Smoking status: Former Smoker    Years: 16.00    Types: Cigarettes    Last attempt to quit: 12/29/1985    Years since quitting: 32.9  . Smokeless tobacco: Never Used  Substance and Sexual Activity  . Alcohol use: Yes    Alcohol/week: 4.0 - 5.0 standard drinks    Types: 4 - 5 Cans of beer per week    Comment: 5 times per week  . Drug use: No  . Sexual activity: Yes    Partners: Female  Lifestyle  . Physical  activity:    Days per week: Not on file    Minutes per session: Not on file  . Stress: Not on file  Relationships  . Social connections:    Talks on phone: Not on file    Gets together: Not on file    Attends religious service: Not on file    Active member of club or organization: Not on file    Attends meetings of clubs or organizations: Not on file    Relationship status: Not on file  Other Topics Concern  . Not on file  Social History Narrative   Exercise-- 3 days   Pt has hs degree   Family History  Problem Relation Age of Onset  . Ovarian cancer Sister   . Stomach cancer Sister   . Heart disease Sister        MI  . Heart disease Sister        MI  . Alcohol abuse Unknown   . Depression Unknown   . Arthritis Unknown   . Hypertension Unknown   . Stomach cancer Maternal Grandmother   . COPD Mother   . Stroke Father   . Coronary artery  disease Unknown   . Ovarian cancer Unknown        neice  . Ovarian cancer Unknown        neice  . Colon cancer Unknown    Allergies  Allergen Reactions  . Niacin Anaphylaxis   Prior to Admission medications   Medication Sig Start Date End Date Taking? Authorizing Provider  atorvastatin (LIPITOR) 20 MG tablet Take 1 tablet (20 mg total) by mouth daily. 11/16/18  Yes Roma Schanz R, DO  gabapentin (NEURONTIN) 300 MG capsule Take 1 capsule (300 mg total) by mouth 3 (three) times daily. 09/30/18  Yes [name], DO  glimepiride ([name]) 2 MG tablet Take 1 tablet (2 mg total) by mouth daily before breakfast. 11/16/18  Yes Roma Schanz R, DO  Ibuprofen 200 MG CAPS Take by mouth. Patient takes 5-20 tablets daily.   Yes [provider]  lisinopril (PRINIVIL,ZESTRIL) 5 MG tablet TAKE 1 TABLET (5 MG TOTAL) BY MOUTH DAILY. 07/02/18  Yes Roma Schanz R, DO  metFORMIN (GLUCOPHAGE-XR) 500 MG 24 hr tablet TAKE TWO TABLETS BY MOUTH DAILY 09/30/18  Yes Roma Schanz R, DO  omeprazole (PRILOSEC) 40 MG capsule  TAKE 1 CAPSULE (40 MG TOTAL) BY MOUTH DAILY. 11/04/18  Yes Roma Schanz R, DO  ondansetron (ZOFRAN) 4 MG tablet Take 1 tablet (4 mg total) by mouth every 8 (eight) hours as needed. 07/12/18  Yes Shelda Pal, DO  oxyCODONE-acetaminophen (ROXICET) 5-325 MG tablet Take 1 tablet by mouth every 8 (eight) hours as needed for severe pain. 10/28/18  Yes Roma Schanz R, DO  sertraline (ZOLOFT) 100 MG tablet TAKE 1 TABLET (100 MG TOTAL) BY MOUTH DAILY. 07/02/18  Yes Roma Schanz R, DO  tiZANidine (ZANAFLEX) 4 MG tablet Take 1 tablet (4 mg total) by mouth every 6 (six) hours as needed for muscle spasms. 11/04/18  Yes Roma Schanz R, DO  BAYER CONTOUR TEST test strip Check Blood sugar once daily. Dx E11.9 02/26/16   [name], Alferd Apa, DO  BAYER MICROLET LANCETS lancets Check Blood sugar once daily. Dx E11.9 02/26/16   [name], DO     Positive ROS: All other systems have been reviewed and were otherwise negative with the exception of those mentioned in the HPI and as above.  Physical Exam: General: Alert, no acute distress Cardiovascular: No pedal edema Respiratory: No cyanosis, no use of accessory musculature GI: No organomegaly, abdomen is soft and non-tender Skin: No lesions in the area of chief complaint Neurologic: Sensation intact distally Psychiatric: Patient is competent for consent with normal mood and affect Lymphatic: No axillary or cervical lymphadenopathy  MUSCULOSKELETAL: Active motion of the right shoulder is 0 to 30 degrees.  Profound weakness with supraspinatus and infraspinatus testing.  No pain over the acromioclavicular joint.  Assessment: Right shoulder massive supraspinatus and infraspinatus tear, probably acute on chronic.   Plan: Plan for Procedure(s): Right shoulder arthroscopy with acromioplasty, extensive debridement, hopefully with a partial repair versus complete repair  The risks benefits and alternatives were  discussed with the patient including but not limited to the risks of nonoperative treatment, versus surgical intervention including infection, recurrent tear, persistent weakness, the need for conversion to arthroplasty, bleeding, nerve injury,  blood clots, cardiopulmonary complications, morbidity, mortality, among others, and they were willing to proceed.   He has substantial opioid dependency, takes approximately 6 tablets of Percocet 5/325/day, this will be a challenge to manage his postoperative  pain, we will plan for Dilaudid as well as additional oxycodone if needed.  I have counseled them on the risks of overdose, and they will try and be mindful and protective of his narcotic consumption.  Johnny Bridge, MD Cell [phone]   11/23/2018 11:15 AM

## 2018-11-23 NOTE — Anesthesia Postprocedure Evaluation (Signed)
Anesthesia Post Note  Patient: Ronald Petersen  Procedure(s) Performed: SHOULDER ARTHROSCOPY WITH ROTATOR CUFF REPAIR AND SUBACROMIAL DECOMPRESSION (Right Shoulder) ARTHROSCOPIC ROTATOR CUFF REPAIR (Right )     Patient location during evaluation: PACU Anesthesia Type: General Level of consciousness: awake Pain management: pain level controlled Vital Signs Assessment: post-procedure vital signs reviewed and stable Respiratory status: spontaneous breathing Cardiovascular status: stable Postop Assessment: no apparent nausea or vomiting Anesthetic complications: no    Last Vitals:  Vitals:   11/23/18 1445 11/23/18 1500  BP: 124/66 (!) 151/83  Pulse: 85 83  Resp: 13 16  Temp:  36.6 C  SpO2: 93% 94%    Last Pain:  Vitals:   11/23/18 1500  TempSrc:   PainSc: 0-No pain   Pain Goal: Patients Stated Pain Goal: 3 (11/23/18 0947)               Huston Foley

## 2018-11-23 NOTE — Discharge Instructions (Signed)
Diet: As you were doing prior to hospitalization   Shower:  May shower but keep the wounds dry, use an occlusive plastic wrap, NO SOAKING IN TUB.  If the bandage gets wet, change with a clean dry gauze.  If you have a splint on, leave the splint in place and keep the splint dry with a plastic bag.  Dressing:  You may change your dressing 3-5 days after surgery, unless you have a splint.  If you have a splint, then just leave the splint in place and we will change your bandages during your first follow-up appointment.    If you had hand or foot surgery, we will plan to remove your stitches in about 2 weeks in the office.  For all other surgeries, there are sticky tapes (steri-strips) on your wounds and all the stitches are absorbable.  Leave the steri-strips in place when changing your dressings, they will peel off with time, usually 2-3 weeks.  Activity:  Increase activity slowly as tolerated, but follow the weight bearing instructions below.  The rules on driving is that you can not be taking narcotics while you drive, and you must feel in control of the vehicle.    Weight Bearing:   Sling at all times except hygiene.  Okay to move fingers, wrist, and hand, and can move your elbow up and down, but keep the elbow at your side..    To prevent constipation: you may use a stool softener such as -  Colace (over the counter) 100 mg by mouth twice a day  Drink plenty of fluids (prune juice may be helpful) and high fiber foods Miralax (over the counter) for constipation as needed.    Itching:  If you experience itching with your medications, try taking only a single pain pill, or even half a pain pill at a time.  You may take up to 10 pain pills per day, and you can also use benadryl over the counter for itching or also to help with sleep.   Precautions:  If you experience chest pain or shortness of breath - call 911 immediately for transfer to the hospital emergency department!!  If you develop a  fever greater that 101 F, purulent drainage from wound, increased redness or drainage from wound, or calf pain -- Call the office at [phone]                                                Follow- Up Appointment:  Please call for an appointment to be seen in 2 weeks [place] - [phone]    Post Anesthesia Home Care Instructions  Activity: Get plenty of rest for the remainder of the day. A responsible individual must stay with you for 24 hours following the procedure.  For the next 24 hours, DO NOT: -Drive a car -Paediatric nurse -Drink alcoholic beverages -Take any medication unless instructed by your physician -Make any legal decisions or sign important papers.  Meals: Start with liquid foods such as gelatin or soup. Progress to regular foods as tolerated. Avoid greasy, spicy, heavy foods. If nausea and/or vomiting occur, drink only clear liquids until the nausea and/or vomiting subsides. Call your physician if vomiting continues.  Special Instructions/Symptoms: Your throat may feel dry or sore from the anesthesia or the breathing tube placed in your throat during surgery. If this causes discomfort, gargle  with warm salt water. The discomfort should disappear within 24 hours.  If you had a scopolamine patch placed behind your ear for the management of post- operative nausea and/or vomiting:  1. The medication in the patch is effective for 72 hours, after which it should be removed.  Wrap patch in a tissue and discard in the trash. Wash hands thoroughly with soap and water. 2. You may remove the patch earlier than 72 hours if you experience unpleasant side effects which may include dry mouth, dizziness or visual disturbances. 3. Avoid touching the patch. Wash your hands with soap and water after contact with the patch.   Regional Anesthesia Blocks  1. Numbness or the inability to move the "blocked" extremity may last from 3-48 hours after placement. The length of time depends  on the medication injected and your individual response to the medication. If the numbness is not going away after 48 hours, call your surgeon.  2. The extremity that is blocked will need to be protected until the numbness is gone and the  Strength has returned. Because you cannot feel it, you will need to take extra care to avoid injury. Because it may be weak, you may have difficulty moving it or using it. You may not know what position it is in without looking at it while the block is in effect.  3. For blocks in the legs and feet, returning to weight bearing and walking needs to be done carefully. You will need to wait until the numbness is entirely gone and the strength has returned. You should be able to move your leg and foot normally before you try and bear weight or walk. You will need someone to be with you when you first try to ensure you do not fall and possibly risk injury.  4. Bruising and tenderness at the needle site are common side effects and will resolve in a few days.  5. Persistent numbness or new problems with movement should be communicated to the surgeon or the [facility] [phone] [place] [phone]).

## 2018-11-23 NOTE — Op Note (Signed)
11/23/2018  3:39 PM  PATIENT:  Ronald Petersen    PRE-OPERATIVE DIAGNOSIS: Right shoulder massive rotator cuff tear with impingement syndrome and possible labral pathology  POST-OPERATIVE DIAGNOSIS: Right shoulder massive rotator cuff tear involving supraspinatus, infraspinatus, with impingement syndrome, anterior and posterior and superior labral fraying.  PROCEDURE: Right shoulder arthroscopy with extensive debridement, acromioplasty, and rotator cuff repair  SURGEON:  Johnny Bridge, MD  PHYSICIAN ASSISTANT: Joya Gaskins, OPA-C, present and scrubbed throughout the case, critical for completion in a timely fashion, and for retraction, instrumentation, and closure.  ANESTHESIA:   General with regional block  PREOPERATIVE INDICATIONS:  [name] is a  62 y.o. male with significant shoulder pain who failed conservative measures and elected for surgical management.  He fell approximately 8 weeks ago, and had acute loss of function with severe pain.  Preoperative MRI demonstrated a massive rotator cuff tear with retraction back to the glenoid.  He elected for surgical management.  The risks benefits and alternatives were discussed with the patient preoperatively including but not limited to the risks of infection, bleeding, nerve injury, cardiopulmonary complications, the need for revision surgery, recurrent cuff tear, progression of rotator cuff arthropathy with the need for future arthroplasty, among others, and the patient was willing to proceed.  ESTIMATED BLOOD LOSS: Minimal  OPERATIVE IMPLANTS: Arthrex Biocomposite SwiveLock 4.75 x 3 for the medial row preloaded with fibertape, and 2 lateral Biocomposite SwiveLock 4.75 mm anchors for the lateral row in a SpeedBridge configuration.  I used the rescue sutures on the anterior and posterior anchor to secure the cuff on the medial side using traditional arthroscopic tying.  OPERATIVE FINDINGS: Shoulder had nearly full motion during  examination under anesthesia, there was a little bit of a gummy feel at the endpoints.  There was minimal chondral damage.  The biceps was actually in reasonably good condition and the subscapularis was intact and the biceps pulley was intact.  The supraspinatus and infraspinatus had a massive tear with significant retraction, at first I did not think it was repairable.  I was able to find adequate tissue for repair, and was able to advance it laterally.  Tissue quality was mediocre, bone quality was reasonably good although somewhat deficient anteriorly.  I suspect a component of this was acute on chronic.  OPERATIVE PROCEDURE: The patient was brought to the operating room and placed in the supine position.  General anesthesia was administered and the patient positioned in a beach chair position.  IV antibiotics were given.  The upper extremity was prepped and draped in the usual sterile fashion.  Timeout performed.  Diagnostic arthroscopy was carried out with the above named findings.    The glenoid labrum was debrided anteriorly and superiorly.    I went to the subacromial space, performed a bursectomy.  Visualization was challenging.  I debrided the undersurface of the cuff as well as prepared the medial edge of the tuberosity for reimplantation using the shaver and the bur.  I performed a CA ligament release, with a light acromioplasty.  I evaluated the tear from viewing laterally, used the shaver from posterior laterally, debrided the tear as well as the bony footprint, and prepared the tendon for reinsertion.  I placed 3 anchors from above using an anterior and posterior portal with percutaneous placement for the medial row preloaded with fiber tape in each anchor.  I passed the sutures using a scorpion suture passer from front to back, and also passed the safety sutures with #2  FiberWire through the tendon anteriorly and posteriorly.  I tied the sutures, and then cut them, and this restored the  cuff laterally to the anatomic footprint.  I then took the remaining fiber tape, and then brought these into lateral anchors.  Excellent fixation and reduction of the tendon was achieved.  I was surprised that I was able to actually get the repair back to bone, but I was very satisfied in the end.  The instruments were removed, the portals closed with Monocryl followed by Steri-Strips and sterile gauze.  The patient was awakened and returned to the PACU in stable and satisfactory condition.  There were no complications and He tolerated the procedure well.

## 2018-11-23 NOTE — Anesthesia Procedure Notes (Signed)
Anesthesia Regional Block: Interscalene brachial plexus block   Pre-Anesthetic Checklist: ,, timeout performed, Correct Patient, Correct Site, Correct Laterality, Correct Procedure, Correct Position, site marked, Risks and benefits discussed,  Surgical consent,  Pre-op evaluation,  At surgeon's request and post-op pain management  Laterality: Upper and Right  Prep: Maximum Sterile Barrier Precautions used, chloraprep       Needles:  Injection technique: Single-shot  Needle Type: Echogenic Needle     Needle Length: 5cm  Needle Gauge: 21     Additional Needles:   Procedures:,,,, ultrasound used (permanent image in chart),,,,  Narrative:  Start time: 11/23/2018 10:12 AM End time: 11/23/2018 10:22 AM Injection made incrementally with aspirations every 5 mL.  Performed by: Personally  Anesthesiologist: Barnet Glasgow, MD  Additional Notes: Block assessed prior to procedure. Patient tolerated procedure well.

## 2018-11-23 NOTE — Progress Notes (Signed)
AssistedDr. Houser with right, ultrasound guided, interscalene  block. Side rails up, monitors on throughout procedure. See vital signs in flow sheet. Tolerated Procedure well.  

## 2018-11-23 NOTE — Transfer of Care (Signed)
Immediate Anesthesia Transfer of Care Note  Patient: Ronald Petersen  Procedure(s) Performed: SHOULDER ARTHROSCOPY WITH ROTATOR CUFF REPAIR AND SUBACROMIAL DECOMPRESSION (Right Shoulder) ARTHROSCOPIC ROTATOR CUFF REPAIR (Right )  Patient Location: PACU  Anesthesia Type:GA combined with regional for post-op pain  Level of Consciousness: awake, alert  and oriented  Airway & Oxygen Therapy: Patient Spontanous Breathing and Patient connected to face mask oxygen  Post-op Assessment: Report given to RN and Post -op Vital signs reviewed and stable  Post vital signs: Reviewed and stable  Last Vitals:  Vitals Value Taken Time  BP 166/82   Temp    Pulse 87 11/23/2018  2:08 PM  Resp 16 11/23/2018  2:08 PM  SpO2 99 % 11/23/2018  2:08 PM  Vitals shown include unvalidated device data.  Last Pain:  Vitals:   11/23/18 0947  TempSrc: Oral  PainSc: 1       Patients Stated Pain Goal: 3 (42/10/31 2811)  Complications: No apparent anesthesia complications

## 2018-11-23 NOTE — Anesthesia Procedure Notes (Signed)
Procedure Name: Intubation Date/Time: 11/23/2018 11:42 AM Performed by: Genelle Bal, CRNA Pre-anesthesia Checklist: Patient identified, Emergency Drugs available, Suction available and Patient being monitored Patient Re-evaluated:Patient Re-evaluated prior to induction Oxygen Delivery Method: Circle system utilized Preoxygenation: Pre-oxygenation with 100% oxygen Induction Type: IV induction Ventilation: Mask ventilation without difficulty and Oral airway inserted - appropriate to patient size Laryngoscope Size: Miller and 2 Tube type: Oral Tube size: 8.0 mm Number of attempts: 1 Airway Equipment and Method: Stylet and Oral airway Placement Confirmation: ETT inserted through vocal cords under direct vision,  positive ETCO2 and breath sounds checked- equal and bilateral Secured at: 23 cm Tube secured with: Tape Dental Injury: Teeth and Oropharynx as per pre-operative assessment

## 2018-11-24 ENCOUNTER — Encounter (HOSPITAL_BASED_OUTPATIENT_CLINIC_OR_DEPARTMENT_OTHER): Payer: Self-pay | Admitting: Orthopedic Surgery

## 2018-11-24 MED ORDER — HYDROMORPHONE HCL 1 MG/ML IJ SOLN
INTRAMUSCULAR | Status: AC
  Filled 2018-11-24: qty 0.5

## 2018-11-24 MED ORDER — HYDROMORPHONE HCL 1 MG/ML IJ SOLN
INTRAMUSCULAR | Status: AC
  Filled 2018-11-24: qty 1

## 2018-11-24 MED FILL — diazePAM 5 MG TABS: 5 | 8 days supply | Qty: 30 | Fill #0

## 2018-11-24 MED FILL — HYDROmorphone HCL 2 MG TABS: 2 | 5 days supply | Qty: 30 | Fill #0

## 2018-11-24 MED FILL — oxyCODONE HCL 5 MG TABS: 5 | 5 days supply | Qty: 30 | Fill #0

## 2018-11-24 NOTE — Addendum Note (Signed)
Addendum  created 11/24/18 1342 by Lyn Hollingshead, MD   Intraprocedure Staff edited

## 2018-11-24 NOTE — Addendum Note (Signed)
Addendum  created 11/24/18 2109 by Barnet Glasgow, MD   Intraprocedure Staff edited

## 2018-11-24 NOTE — Discharge Summary (Addendum)
Physician Discharge Summary  Patient ID: Ronald Petersen MRN: [id number] DOB/AGE: 63-30-1957 63 y.o.  Admit date: 11/23/2018 Discharge date: 11/24/2018  Admission Diagnoses:  Right rotator cuff tear  Discharge Diagnoses:  Principal Problem:   Right rotator cuff tear   Past Medical History:  Diagnosis Date  . Anxiety   . Arthritis   . Complication of anesthesia    aspirated with back surgery at age 72  . Depression   . Diabetes mellitus   . GERD (gastroesophageal reflux disease)   . Hyperlipidemia   . Hypertension   . Neuromuscular disorder ([place])    NEUROPATHY  . Right rotator cuff tear 11/23/2018  . Sleep apnea    no CPAP    Surgeries: Procedure(s): SHOULDER ARTHROSCOPY WITH ROTATOR CUFF REPAIR AND SUBACROMIAL DECOMPRESSION ARTHROSCOPIC ROTATOR CUFF REPAIR on 11/23/2018   Consultants (if any):   Discharged Condition: Improved  Hospital Course: Ronald Petersen is an 61 y.o. male who was admitted 11/23/2018 with a diagnosis of Right rotator cuff tear and went to the operating room on 11/23/2018 and underwent the above named procedures.  He was monitored overnight with a history of narcotic dependence and untreated sleep apnea.  His pain was poorly controlled even with the dilaudid and we had to add valium to his discharge medications (done through the office).    He was given perioperative antibiotics:  Anti-infectives (From admission, onward)   Start     Dose/Rate Route Frequency Ordered Stop   11/23/18 1500  ceFAZolin (ANCEF) IVPB 1 g/50 mL premix     1 g 100 mL/hr over 30 Minutes Intravenous Every 6 hours 11/23/18 1446 11/24/18 0645   11/23/18 0915  ceFAZolin (ANCEF) IVPB 2g/100 mL premix     2 g 200 mL/hr over 30 Minutes Intravenous On call to O.R. 11/23/18 0914 11/23/18 1215    .  He was given sequential compression devices, early ambulation for DVT prophylaxis.  He benefited maximally from the hospital stay and there were no complications.    Recent  vital signs:  Vitals:   11/24/18 0600 11/24/18 0700  BP:  (!) 157/79  Pulse: 65 75  Resp: 20 20  Temp: 97.6 F (36.4 C)   SpO2: 97% 96%    Recent laboratory studies:  Lab Results  Component Value Date   HGB 15.5 07/08/2018   HGB 16.0 09/14/2017   HGB 14.7 06/12/2016   Lab Results  Component Value Date   WBC 7.6 07/08/2018   PLT 156.0 07/08/2018   No results found for: INR Lab Results  Component Value Date   NA 136 11/04/2018   K 4.0 11/04/2018   CL 99 11/04/2018   CO2 29 11/04/2018   BUN 16 11/04/2018   CREATININE 0.71 11/04/2018   GLUCOSE 207 (H) 11/04/2018    Discharge Medications:   Allergies as of 11/24/2018      Reactions   Niacin Anaphylaxis      Medication List    STOP taking these medications   Ibuprofen 200 MG Caps     TAKE these medications   atorvastatin 20 MG tablet Commonly known as:  LIPITOR Take 1 tablet (20 mg total) by mouth daily.   BAYER CONTOUR TEST test strip Generic drug:  glucose blood Check Blood sugar once daily. Dx E11.9   BAYER MICROLET LANCETS lancets Check Blood sugar once daily. Dx E11.9   gabapentin 300 MG capsule Commonly known as:  NEURONTIN Take 1 capsule (300 mg total) by mouth 3 (three) times  daily.   glimepiride 2 MG tablet Commonly known as:  [name] Take 1 tablet (2 mg total) by mouth daily before breakfast.   HYDROmorphone 2 MG tablet Commonly known as:  DILAUDID Take 1 tablet (2 mg total) by mouth every 4 (four) hours as needed for severe pain.   lisinopril 5 MG tablet Commonly known as:  PRINIVIL,ZESTRIL TAKE 1 TABLET (5 MG TOTAL) BY MOUTH DAILY.   metFORMIN 500 MG 24 hr tablet Commonly known as:  GLUCOPHAGE-XR TAKE TWO TABLETS BY MOUTH DAILY   omeprazole 40 MG capsule Commonly known as:  PRILOSEC TAKE 1 CAPSULE (40 MG TOTAL) BY MOUTH DAILY.   ondansetron 4 MG tablet Commonly known as:  ZOFRAN Take 1 tablet (4 mg total) by mouth every 8 (eight) hours as needed.   oxyCODONE 5 MG immediate  release tablet Commonly known as:  Oxy IR/ROXICODONE Take 1 tablet (5 mg total) by mouth every 4 (four) hours as needed for severe pain. Use only for breakthrough pain, if your regular Percocet order is not sufficient, and the Dilaudid is not sufficient.   oxyCODONE-acetaminophen 5-325 MG tablet Commonly known as:  PERCOCET/ROXICET Take 1 tablet by mouth every 8 (eight) hours as needed for severe pain.   sennosides-docusate sodium 8.6-50 MG tablet Commonly known as:  SENOKOT-S Take 2 tablets by mouth daily.   sertraline 100 MG tablet Commonly known as:  ZOLOFT TAKE 1 TABLET (100 MG TOTAL) BY MOUTH DAILY.   tiZANidine 4 MG tablet Commonly known as:  ZANAFLEX Take 1 tablet (4 mg total) by mouth every 6 (six) hours as needed for muscle spasms.       Diagnostic Studies: Mr Shoulder Right Wo Contrast  Result Date: 11/07/2018 CLINICAL DATA:  Right proximal arm pain with limited range of motion and crepitus. Fall 5 weeks ago. Right arm and hand weakness. EXAM: MRI OF THE RIGHT SHOULDER WITHOUT CONTRAST TECHNIQUE: Multiplanar, multisequence MR imaging of the shoulder was performed. No intravenous contrast was administered. COMPARISON:  Radiographs of 09/28/2018 FINDINGS: Despite efforts by the technologist and patient, motion artifact is present on today's exam and could not be eliminated. This reduces exam sensitivity and specificity. Rotator cuff: Full-thickness full width rupture of the supraspinatus tendon with 3.8 cm retraction nearly to the plane of the glenoid, image 11/6. Full-thickness full width infraspinatus rupture with 1.8 cm retraction. Moderate subscapularis tendinopathy with distal expansion of the tendon and somewhat amorphous margins. High-grade partial tearing of the teres minor with indistinctness of the distal tendon. Muscles: There is edema in the infraspinatus and teres minor muscles with mild teres minor atrophy. Edema in the supraspinatus muscle as well. Biceps long head:  Mild expansion and increased signal in the intra-articular segment compatible with mild to moderate tendinopathy. Acromioclavicular Joint: Mild spurring with mild subcortical marrow edema and edema signal within the [facility] joint itself. Mildly widened AC joint at 10 mm. Type II acromion. As expected there is fluid throughout the subacromial subdeltoid bursa. Glenohumeral Joint: Moderate to prominent degenerative chondral thinning. Joint effusion communicating with the bursa. Superior left subluxation of the humeral head. Labrum:  Severely blunted superior labrum, likely torn. Bones: No significant extra-articular osseous abnormalities identified. Other: No supplemental non-categorized findings. IMPRESSION: 1. Full-thickness full width ruptures of the supraspinatus tendon (retracted 3.8 cm) and of the infraspinatus tendon (retracted 1.8 cm). Edema in the supraspinatus and infraspinatus muscles likely related to denervation. 2. High-grade partial tearing of the teres minor tendon with some degree of muscle atrophy and muscle edema as  well. 3. Moderate subscapularis tendinopathy with distal expansion of the tendon. 4. Mild to moderate biceps tendinopathy. 5. Moderate degenerative AC joint arthropathy. Mildly widened AC joint at 10 mm, cause uncertain. 6. Moderate to prominent degenerative chondral thinning in the humeral head with mild superior subluxation of the humeral head allowed by the rotator cuff tears. Glenohumeral joint effusion communicates with the subacromial subdeltoid bursa. 7. Severely blunted superior labrum, likely torn. Electronically Signed   By: Van Clines M.D.   On: 11/07/2018 10:39   [place] Complete Joint Space Structures Up Right  Result Date: 11/07/2018 Biceps tendon intact with moderate tenosynovitis.  Subscapularis appears normal without evidence tear.  AC joint with mild arthropathy.  Infraspinatus very thin distally with thickening ~2 cm proximally suggestive of at least partial  thickness tear with retraction.  Supraspinatus is not visualized consistent with full thickness retracted tear.   Disposition: Discharge disposition: 01-Home or Self Care         Follow-up Information    [name], MD. Schedule an appointment as soon as possible for a visit in 2 weeks.   Specialty:  Orthopedic Surgery Contact information: [place] [address] [phone]            Signed: Johnny Bridge 11/24/2018, 9:02 AM

## 2018-11-24 NOTE — Addendum Note (Signed)
Addendum  created 11/24/18 2105 by Barnet Glasgow, MD   [facility] Staff edited

## 2018-12-07 ENCOUNTER — Other Ambulatory Visit: Payer: Self-pay | Admitting: Family Medicine

## 2018-12-07 DIAGNOSIS — G8929 Other chronic pain: Secondary | ICD-10-CM

## 2018-12-07 MED FILL — OXYCODONE-ACETAMINOPHEN 5-3: 5-325 | 30 days supply | Qty: 90 | Fill #0

## 2018-12-23 ENCOUNTER — Other Ambulatory Visit: Payer: Self-pay | Admitting: Family Medicine

## 2018-12-23 DIAGNOSIS — I10 Essential (primary) hypertension: Secondary | ICD-10-CM

## 2018-12-23 MED FILL — GABAPENTIN 300 MG CAPSULE: 300 | 90 days supply | Qty: 270 | Fill #1

## 2018-12-24 MED FILL — LISINOPRIL 5 MG TABLET: 5 | 90 days supply | Qty: 90 | Fill #0

## 2019-01-04 ENCOUNTER — Ambulatory Visit (INDEPENDENT_AMBULATORY_CARE_PROVIDER_SITE_OTHER): Payer: Medicaid Other | Admitting: Family Medicine

## 2019-01-04 ENCOUNTER — Encounter: Payer: Self-pay | Admitting: Family Medicine

## 2019-01-04 ENCOUNTER — Ambulatory Visit (INDEPENDENT_AMBULATORY_CARE_PROVIDER_SITE_OTHER)
Admission: RE | Admit: 2019-01-04 | Discharge: 2019-01-04 | Disposition: A | Discharge status: Home / Self Care | Payer: Medicaid Other | Source: Ambulatory Visit | Attending: Family Medicine | Admitting: Family Medicine

## 2019-01-04 VITALS — BP 126/80 | HR 53 | Temp 97.9°F | Resp 16 | Ht 72.0 in | Wt 213.4 lb

## 2019-01-04 DIAGNOSIS — R29898 Other symptoms and signs involving the musculoskeletal system: Secondary | ICD-10-CM

## 2019-01-04 DIAGNOSIS — R296 Repeated falls: Secondary | ICD-10-CM

## 2019-01-04 DIAGNOSIS — Z560 Unemployment, unspecified: Secondary | ICD-10-CM

## 2019-01-04 DIAGNOSIS — M2550 Pain in unspecified joint: Secondary | ICD-10-CM

## 2019-01-04 DIAGNOSIS — K219 Gastro-esophageal reflux disease without esophagitis: Secondary | ICD-10-CM

## 2019-01-04 DIAGNOSIS — R110 Nausea: Secondary | ICD-10-CM

## 2019-01-04 DIAGNOSIS — G8929 Other chronic pain: Secondary | ICD-10-CM

## 2019-01-04 MED ORDER — OMEPRAZOLE 40 MG PO CPDR: DELAYED_RELEASE_CAPSULE | ORAL | 3 refills | Status: DC

## 2019-01-04 MED ORDER — GABAPENTIN 300 MG PO CAPS: 300.0000 mg | ORAL_CAPSULE | Freq: Three times a day (TID) | ORAL | 1 refills | Status: DC

## 2019-01-04 MED ORDER — ONDANSETRON HCL 4 MG PO TABS: 4.0000 mg | ORAL_TABLET | Freq: Three times a day (TID) | ORAL | 0 refills | Status: DC | PRN

## 2019-01-04 MED FILL — ONDANSETRON HCL 4 MG TABLET: 4 | 6 days supply | Qty: 20 | Fill #0

## 2019-01-04 MED FILL — GABAPENTIN 300 MG CAPSULE: 300 | 90 days supply | Qty: 270 | Fill #0

## 2019-01-04 MED FILL — OMEPRAZOLE 40 MG CPDR: 40 | 90 days supply | Qty: 90 | Fill #0

## 2019-01-04 NOTE — Patient Instructions (Signed)
Weakness °Weakness is a lack of strength. You may feel weak all over your body (generalized), or you may feel weak in one specific part of your body (focal). Common causes of weakness include: °· Infection and immune system disorders. °· Physical exhaustion. °· Internal bleeding or other blood loss that results in a lack of red blood cells (anemia). °· Dehydration. °· An imbalance in mineral (electrolyte) levels, such as potassium. °· Heart disease, circulation problems, or stroke. °Other causes include: °· Some medicines or cancer treatment. °· Stress, anxiety, or depression. °· Nervous system disorders. °· Thyroid disorders. °· Loss of muscle strength because of age or inactivity. °· Poor sleep quality or sleep disorders. °The cause of your weakness may not be known. Some causes of weakness can be serious, so it is important to see your health care provider. °Follow these instructions at home: °Activity °· Rest as needed. °· Try to get enough sleep. Most adults need 7-8 hours of quality sleep each night. Talk to your health care provider about how much sleep you need each night. °· Do exercises, such as arm curls and leg raises, for 30 minutes at least 2 days a week or as told by your health care provider. This helps build muscle strength. °· Consider working with a physical therapist or trainer who can develop an exercise plan to help you gain muscle strength. °General instructions ° °· Take over-the-counter and prescription medicines only as told by your health care provider. °· Eat a healthy, well-balanced diet. This includes: °? Proteins to build muscles, such as lean meats and fish. °? Fresh fruits and vegetables. °? Carbohydrates to boost energy, such as whole grains. °· Drink enough fluid to keep your urine pale yellow. °· Keep all follow-up visits as told by your health care provider. This is important. °Contact a health care provider if your weakness: °· Does not improve or gets worse. °· Affects your  ability to think clearly. °· Affects your ability to do your normal daily activities. °Get help right away if you: °· Develop sudden weakness, especially on one side of your face or body. °· Have chest pain. °· Have trouble breathing or shortness of breath. °· Have problems with your vision. °· Have trouble talking or swallowing. °· Have trouble standing or walking. °· Are light-headed or lose consciousness. °Summary °· Weakness is a lack of strength. You may feel weak all over your body or just in one specific part of your body. °· Weakness can be caused by a variety of things. In some cases, the cause may be unknown. °· Rest as needed, and try to get enough sleep. Most adults need 7-8 hours of quality sleep each night. °· Eat a healthy, well-balanced diet. °This information is not intended to replace advice given to you by your health care provider. Make sure you discuss any questions you have with your health care provider. °Document Released: 12/15/2005 Document Revised: 07/21/2018 Document Reviewed: 07/21/2018 °Elsevier Interactive Patient Education © 2019 [employer] ° °

## 2019-01-04 NOTE — Progress Notes (Signed)
Patient ID: Ronald Petersen, male    DOB: 1956-12-10  Age: 63 y.o. MRN: [id number]    Subjective:  Subjective  HPI SHEPARD KELTZ presents for falling a lot and leg weakness.  Pt has no ins so is unable to have any imaging at this time ---has a hx of back surgery--- legs progressively getting weaker.  No falls.     Review of Systems  Constitutional: Negative for appetite change, diaphoresis, fatigue and unexpected weight change.  Eyes: Negative for pain, redness and visual disturbance.  Respiratory: Negative for cough, chest tightness, shortness of breath and wheezing.   Cardiovascular: Negative for chest pain, palpitations and leg swelling.  Endocrine: Negative for cold intolerance, heat intolerance, polydipsia, polyphagia and polyuria.  Genitourinary: Negative for difficulty urinating, dysuria and frequency.  Neurological: Positive for weakness. Negative for dizziness, light-headedness, numbness and headaches.    History Past Medical History:  Diagnosis Date  . Anxiety   . Arthritis   . Complication of anesthesia    aspirated with back surgery at age 29  . Depression   . Diabetes mellitus   . GERD (gastroesophageal reflux disease)   . Hyperlipidemia   . Hypertension   . Neuromuscular disorder ([place])    NEUROPATHY  . Right rotator cuff tear 11/23/2018  . Sleep apnea    no CPAP    He has a past surgical history that includes Appendectomy; Cholecystectomy; Lumbar laminectomy; Shoulder arthroscopy with rotator cuff repair and subacromial decompression (Right, 11/23/2018); and Arthroscopic rotator cuff repair (Right, 11/23/2018).   His family history includes Alcohol abuse in his unknown relative; Arthritis in his unknown relative; COPD in his mother; Colon cancer in his unknown relative; Coronary artery disease in his unknown relative; Depression in his unknown relative; Heart disease in his sister and sister; Hypertension in his unknown relative; Ovarian cancer in his sister,  unknown relative, and unknown relative; Stomach cancer in his maternal grandmother and sister; Stroke in his father.He reports that he quit smoking about 33 years ago. His smoking use included cigarettes. He quit after 16.00 years of use. He has never used smokeless tobacco. He reports current alcohol use of about 4.0 - 5.0 standard drinks of alcohol per week. He reports that he does not use drugs.  Current Outpatient Medications on File Prior to Visit  Medication Sig Dispense Refill  . atorvastatin (LIPITOR) 20 MG tablet Take 1 tablet (20 mg total) by mouth daily. 30 tablet 2  . BAYER CONTOUR TEST test strip Check Blood sugar once daily. Dx E11.9 50 each 12  . BAYER MICROLET LANCETS lancets Check Blood sugar once daily. Dx E11.9 50 each 11  . glimepiride ([name]) 2 MG tablet Take 1 tablet (2 mg total) by mouth daily before breakfast. 30 tablet 2  . HYDROmorphone (DILAUDID) 2 MG tablet Take 1 tablet (2 mg total) by mouth every 4 (four) hours as needed for severe pain. 30 tablet 0  . lisinopril (PRINIVIL,ZESTRIL) 5 MG tablet TAKE 1 TABLET (5 MG TOTAL) BY MOUTH DAILY. 90 tablet 1  . metFORMIN (GLUCOPHAGE-XR) 500 MG 24 hr tablet TAKE TWO TABLETS BY MOUTH DAILY 180 tablet 1  . oxyCODONE (ROXICODONE) 5 MG immediate release tablet Take 1 tablet (5 mg total) by mouth every 4 (four) hours as needed for severe pain. Use only for breakthrough pain, if your regular Percocet order is not sufficient, and the Dilaudid is not sufficient. 30 tablet 0  . oxyCODONE-acetaminophen (PERCOCET/ROXICET) 5-325 MG tablet TAKE 1 TABLET BY MOUTH EVERY  8 (EIGHT) HOURS AS NEEDED FOR SEVERE PAIN. 90 tablet 0  . sennosides-docusate sodium (SENOKOT-S) 8.6-50 MG tablet Take 2 tablets by mouth daily. 30 tablet 1  . sertraline (ZOLOFT) 100 MG tablet TAKE 1 TABLET (100 MG TOTAL) BY MOUTH DAILY. 180 tablet 0  . tiZANidine (ZANAFLEX) 4 MG tablet Take 1 tablet (4 mg total) by mouth every 6 (six) hours as needed for muscle spasms. 30 tablet  1   No current facility-administered medications on file prior to visit.      Objective:  Objective  Physical Exam Vitals signs and nursing note reviewed.  Constitutional:      General: He is sleeping.     Appearance: He is well-developed.  HENT:     Head: Normocephalic and atraumatic.  Eyes:     Pupils: Pupils are equal, round, and reactive to light.  Neck:     Musculoskeletal: Normal range of motion and neck supple.     Thyroid: No thyromegaly.  Cardiovascular:     Rate and Rhythm: Normal rate and regular rhythm.     Heart sounds: No murmur.  Pulmonary:     Effort: Pulmonary effort is normal. No respiratory distress.     Breath sounds: Normal breath sounds. No wheezing or rales.  Chest:     Chest wall: No tenderness.  Musculoskeletal:        General: No tenderness.  Skin:    General: Skin is warm and dry.  Neurological:     Mental Status: He is oriented to person, place, and time.     Motor: Weakness present.     Gait: Gait abnormal.  Psychiatric:        Behavior: Behavior normal.        Thought Content: Thought content normal.        Judgment: Judgment normal.    BP 126/80 (BP Location: Left Arm, Cuff Size: Normal)   Pulse (!) 53   Temp 97.9 F (36.6 C) (Oral)   Resp 16   Ht 6' (1.829 m)   Wt 213 lb 6.4 oz (96.8 kg)   SpO2 98%   BMI 28.94 kg/m  Wt Readings from Last 3 Encounters:  01/04/19 213 lb 6.4 oz (96.8 kg)  11/23/18 215 lb 9.8 oz (97.8 kg)  11/05/18 216 lb (98 kg)     Lab Results  Component Value Date   WBC 7.6 07/08/2018   HGB 15.5 07/08/2018   HCT 44.5 07/08/2018   PLT 156.0 07/08/2018   GLUCOSE 207 (H) 11/04/2018   CHOL 241 (H) 11/04/2018   TRIG (H) 11/04/2018    424.0 Triglyceride is over 400; calculations on Lipids are invalid.   HDL 36.00 (L) 11/04/2018   LDLDIRECT 137.0 11/04/2018   LDLCALC 139 (H) 06/12/2016   ALT 21 11/04/2018   AST 14 11/04/2018   NA 136 11/04/2018   K 4.0 11/04/2018   CL 99 11/04/2018   CREATININE 0.71  11/04/2018   BUN 16 11/04/2018   CO2 29 11/04/2018   TSH 0.97 10/25/2012   PSA 1.32 09/14/2017   HGBA1C 8.7 (H) 11/04/2018   MICROALBUR 1.2 02/26/2016    No results found.   Assessment & Plan:  Plan  I am having Ronit D. Dorce "Quita Skye" maintain his BAYER CONTOUR TEST, BAYER MICROLET LANCETS, sertraline, metFORMIN, tiZANidine, atorvastatin, glimepiride, oxyCODONE, sennosides-docusate sodium, HYDROmorphone, oxyCODONE-acetaminophen, lisinopril, gabapentin, omeprazole, and ondansetron.  Meds ordered this encounter  Medications  . gabapentin (NEURONTIN) 300 MG capsule    Sig: Take 1 capsule (  300 mg total) by mouth 3 (three) times daily.    Dispense:  270 capsule    Refill:  1  . omeprazole (PRILOSEC) 40 MG capsule    Sig: TAKE 1 CAPSULE (40 MG TOTAL) BY MOUTH DAILY.    Dispense:  90 capsule    Refill:  3  . ondansetron (ZOFRAN) 4 MG tablet    Sig: Take 1 tablet (4 mg total) by mouth every 8 (eight) hours as needed.    Dispense:  20 tablet    Refill:  0    Problem List Items Addressed This Visit      Unprioritized   Falling episodes    Has not fallen yet but has had close calls High fall risk  Pt need PT but is not able to afford it at this time--- no ins They are working on getting help [facility]        Relevant Orders   Ambulatory referral to Neurology   DG Lumbar Spine Complete (Completed)   Gastroesophageal reflux disease   Relevant Medications   omeprazole (PRILOSEC) 40 MG capsule   ondansetron (ZOFRAN) 4 MG tablet   Joint pain   Relevant Medications   gabapentin (NEURONTIN) 300 MG capsule   Leg weakness, bilateral - Primary    Suspect etiology is from back  Refer to neuro May need mri / pt But pt unable to afford at this time       Relevant Orders   Ambulatory referral to Neurology   DG Lumbar Spine Complete (Completed)    Other Visit Diagnoses    Chronic pain       Relevant Medications   gabapentin (NEURONTIN) 300 MG capsule   Nausea        Relevant Medications   ondansetron (ZOFRAN) 4 MG tablet      Follow-up: Return if symptoms worsen or fail to improve.  [name], DO

## 2019-01-05 DIAGNOSIS — R29898 Other symptoms and signs involving the musculoskeletal system: Secondary | ICD-10-CM | POA: Insufficient documentation

## 2019-01-05 DIAGNOSIS — R296 Repeated falls: Secondary | ICD-10-CM | POA: Insufficient documentation

## 2019-01-05 NOTE — Assessment & Plan Note (Signed)
Suspect etiology is from back  Refer to neuro May need mri / pt But pt unable to afford at this time

## 2019-01-05 NOTE — Assessment & Plan Note (Signed)
Has not fallen yet but has had close calls High fall risk  Pt need PT but is not able to afford it at this time--- no ins They are working on getting help from [facility]

## 2019-01-07 ENCOUNTER — Encounter: Payer: Self-pay | Admitting: Neurology

## 2019-01-17 ENCOUNTER — Other Ambulatory Visit: Payer: Self-pay | Admitting: Family Medicine

## 2019-01-17 ENCOUNTER — Telehealth: Payer: Self-pay | Admitting: Family Medicine

## 2019-01-17 DIAGNOSIS — G8929 Other chronic pain: Secondary | ICD-10-CM

## 2019-01-17 MED ORDER — OXYCODONE-ACETAMINOPHEN 5-325 MG PO TABS: 1.0000 | ORAL_TABLET | Freq: Three times a day (TID) | ORAL | 0 refills | Status: DC | PRN

## 2019-01-17 NOTE — Telephone Encounter (Signed)
Copied from [place] [id number]. Topic: Quick Communication - Rx Refill/Question >> Jan 17, 2019  3:02 PM Blase Mess A wrote: Medication: oxyCODONE-acetaminophen (PERCOCET/ROXICET) 5-325 MG tablet [[id number]]   Has the patient contacted their pharmacy? Yes  (Agent: If no, request that the patient contact the pharmacy for the refill.) (Agent: If yes, when and what did the pharmacy advise?)  Preferred Pharmacy (with phone number or street name): [place], [place] - [place] [phone] (Phone) [phone] (Fax)    Agent: Please be advised that RX refills may take up to 3 business days. We ask that you follow-up with your pharmacy.

## 2019-01-17 NOTE — Telephone Encounter (Signed)
done

## 2019-01-17 NOTE — Telephone Encounter (Signed)
Database ran on 11/04/18 and is in media for review  Last written: 12/07/18 Last ov: 01/04/19 Next ov: none Contract:08/04/19 UDS: 02/03/19

## 2019-01-18 MED FILL — OXYCODONE-ACETAMINOPHEN 5-3: 5-325 | 30 days supply | Qty: 90 | Fill #0

## 2019-01-18 NOTE — Telephone Encounter (Signed)
Left message that medication has been sent in  °

## 2019-02-09 ENCOUNTER — Ambulatory Visit (INDEPENDENT_AMBULATORY_CARE_PROVIDER_SITE_OTHER): Payer: Medicaid Other | Admitting: Medical

## 2019-02-09 ENCOUNTER — Encounter: Payer: Self-pay | Admitting: Medical

## 2019-02-09 ENCOUNTER — Telehealth: Payer: Self-pay | Admitting: Medical

## 2019-02-09 ENCOUNTER — Ambulatory Visit (HOSPITAL_BASED_OUTPATIENT_CLINIC_OR_DEPARTMENT_OTHER)
Admission: RE | Admit: 2019-02-09 | Discharge: 2019-02-09 | Disposition: A | Discharge status: Home / Self Care | Payer: Medicaid Other | Source: Ambulatory Visit | Attending: Medical | Admitting: Medical

## 2019-02-09 VITALS — BP 149/83 | HR 63 | Temp 97.6°F | Resp 16 | Ht 72.0 in | Wt 205.6 lb

## 2019-02-09 DIAGNOSIS — R109 Unspecified abdominal pain: Secondary | ICD-10-CM | POA: Insufficient documentation

## 2019-02-09 DIAGNOSIS — M549 Dorsalgia, unspecified: Secondary | ICD-10-CM

## 2019-02-09 DIAGNOSIS — R350 Frequency of micturition: Secondary | ICD-10-CM

## 2019-02-09 DIAGNOSIS — R110 Nausea: Secondary | ICD-10-CM

## 2019-02-09 LAB — POC URINALSYSI DIPSTICK (AUTOMATED)
Glucose, UA: NEGATIVE
Ketones, UA: NEGATIVE
Leukocytes, UA: NEGATIVE
NITRITE UA: NEGATIVE
PH UA: 6 (ref 5.0–8.0)
PROTEIN UA: NEGATIVE
RBC UA: NEGATIVE
Spec Grav, UA: 1.015 (ref 1.010–1.025)
UROBILINOGEN UA: NEGATIVE U/dL — AB

## 2019-02-09 LAB — CBC WITH DIFFERENTIAL/PLATELET
BASOS PCT: 0.7 % (ref 0.0–3.0)
Basophils Absolute: 0 10*3/uL (ref 0.0–0.1)
EOS ABS: 0.2 10*3/uL (ref 0.0–0.7)
Eosinophils Relative: 2.7 % (ref 0.0–5.0)
HEMATOCRIT: 45.1 % (ref 39.0–52.0)
Hemoglobin: 15.6 g/dL (ref 13.0–17.0)
LYMPHS PCT: 35.7 % (ref 12.0–46.0)
Lymphs Abs: 2.5 10*3/uL (ref 0.7–4.0)
MCHC: 34.5 g/dL (ref 30.0–36.0)
MCV: 81.9 fl (ref 78.0–100.0)
MONO ABS: 0.5 10*3/uL (ref 0.1–1.0)
Monocytes Relative: 7.9 % (ref 3.0–12.0)
NEUTROS ABS: 3.6 10*3/uL (ref 1.4–7.7)
Neutrophils Relative %: 53 % (ref 43.0–77.0)
PLATELETS: 179 10*3/uL (ref 150.0–400.0)
RBC: 5.5 Mil/uL (ref 4.22–5.81)
RDW: 12.8 % (ref 11.5–15.5)
WBC: 6.9 10*3/uL (ref 4.0–10.5)

## 2019-02-09 LAB — COMPREHENSIVE METABOLIC PANEL
ALT: 27 U/L (ref 0–53)
AST: 17 U/L (ref 0–37)
Albumin: 4.8 g/dL (ref 3.5–5.2)
Alkaline Phosphatase: 91 U/L (ref 39–117)
BUN: 18 mg/dL (ref 6–23)
CHLORIDE: 98 meq/L (ref 96–112)
CO2: 30 meq/L (ref 19–32)
CREATININE: 0.9 mg/dL (ref 0.40–1.50)
Calcium: 10 mg/dL (ref 8.4–10.5)
GFR: 85.06 mL/min (ref >60.00)
Glucose, Bld: 235 mg/dL — ABNORMAL HIGH (ref 70–99)
Potassium: 4.7 mEq/L (ref 3.5–5.1)
Sodium: 136 mEq/L (ref 135–145)
Total Bilirubin: 0.7 mg/dL (ref 0.2–1.2)
Total Protein: 7.5 g/dL (ref 6.0–8.3)

## 2019-02-09 LAB — PSA: PSA: 1.38 ng/mL (ref 0.10–4.00)

## 2019-02-09 LAB — LIPASE: Lipase: 9 U/L — ABNORMAL LOW (ref 11.0–59.0)

## 2019-02-09 LAB — AMYLASE: Amylase: 23 U/L — ABNORMAL LOW (ref 27–131)

## 2019-02-09 MED ORDER — METRONIDAZOLE 500 MG PO TABS: 500.0000 mg | ORAL_TABLET | Freq: Three times a day (TID) | ORAL | 0 refills | Status: DC

## 2019-02-09 MED ORDER — CIPROFLOXACIN HCL 500 MG PO TABS: 500.0000 mg | ORAL_TABLET | Freq: Two times a day (BID) | ORAL | 0 refills | Status: DC

## 2019-02-09 MED ORDER — ONDANSETRON 4 MG PO TBDP: 4.0000 mg | ORAL_TABLET | Freq: Three times a day (TID) | ORAL | 0 refills | Status: DC | PRN

## 2019-02-09 MED FILL — CIPROFLOXACIN HCL 500 MG TA: 500 | 7 days supply | Qty: 14 | Fill #0

## 2019-02-09 MED FILL — ONDANSETRON ODT 4 MG TABLET: 4 | 6 days supply | Qty: 20 | Fill #0

## 2019-02-09 MED FILL — metroNIDAZOLE 500 MG TABS: 500 | 7 days supply | Qty: 21 | Fill #0

## 2019-02-09 NOTE — Telephone Encounter (Signed)
Prescription of Flagyl sent to patient's pharmacy.

## 2019-02-09 NOTE — Patient Instructions (Addendum)
You do describe some recent severe left costovertebral/kidney region pain over the last 2 days.  Also pain radiating to the flank.  With history of kidney stones, I do want to get a CT renal stone study is against kidney stone present.  Also will get CBC, CMP, amylase, lipase, PSA, and urine culture.  You do report some frequent urination and with above symptoms will prescribe Cipro antibiotic for 7 days pending results of testing urine culture.  You report intermittent episodes of constipation and diarrhea.  Some nausea as well.  Will prescribe Zofran.  Make sure that she stay well-hydrated.  Decreased processed carbohydrates in your diet.  With alternating pattern of constipation and loose stools, I want you to get Metamucil over-the-counter use 1 rounded tablespoon in 8 ounces of water 3 times daily.  We will follow imaging result/CT.  They might give some information regarding a bowel pattern and amount of stool present.  Follow-up in 7 days or as needed.

## 2019-02-09 NOTE — Addendum Note (Signed)
Addended by: Hinton Dyer on: 02/09/2019 01:06 PM   Modules accepted: Orders

## 2019-02-09 NOTE — Telephone Encounter (Signed)
Please result pt urine today.

## 2019-02-09 NOTE — Progress Notes (Addendum)
Subjective:    Patient ID: Ronald Petersen, male    DOB: [birth year], 63 y.o.   MRN: [id number]  HPI Pt in with some left side back pain and radiating to flank. Pt had that pain for 2 days. Pain 2 nights ago was severe. Pt uses oxycodone for varius pain per his history. Seems to be controlling pain in back and flank now since this morning. He has history of kidney stones just one time in past. He states was 7-8 mm in size. But when had further work up to do possible  lithotripsy stone not found?  Pt also states one week of nausea and intermittent constipation and loose stools. States on day will have no bm then next day looses. Pt had cholecystectomy and appendectomy  Weight loss over past 6 months. He states gradual weight loss.   Review of Systems  Constitutional: Negative for chills, fatigue and fever.  Respiratory: Negative for apnea, cough, chest tightness, shortness of breath and wheezing.   Cardiovascular: Negative for chest pain and palpitations.  Gastrointestinal: Negative for abdominal pain, diarrhea, nausea and vomiting.       Left abdomen and flank pain.  Genitourinary: Positive for frequency. Negative for difficulty urinating, dysuria, penile swelling, testicular pain and urgency.  Musculoskeletal: Positive for back pain.  Skin: Negative for rash.  Neurological: Negative for seizures, facial asymmetry, speech difficulty, weakness and light-headedness.  Hematological: Negative for adenopathy. Does not bruise/bleed easily.  Psychiatric/Behavioral: Negative for behavioral problems and confusion.   Past Medical History:  Diagnosis Date  . Anxiety   . Arthritis   . Complication of anesthesia    aspirated with back surgery at age 28  . Depression   . Diabetes mellitus   . GERD (gastroesophageal reflux disease)   . Hyperlipidemia   . Hypertension   . Neuromuscular disorder ([place])    NEUROPATHY  . Right rotator cuff tear 11/23/2018  . Sleep apnea    no CPAP     Social  History   Socioeconomic History  . Marital status: Married    Spouse name: Lorriane Shire  . Number of children: 2  . Years of education: Not on file  . Highest education level: Not on file  Occupational History  . Occupation: self employed    Fish farm manager: UNEMPLOYED  Social Needs  . Financial resource strain: Not on file  . Food insecurity:    Worry: Not on file    Inability: Not on file  . Transportation needs:    Medical: Not on file    Non-medical: Not on file  Tobacco Use  . Smoking status: Former Smoker    Years: 16.00    Types: Cigarettes    Last attempt to quit: 12/29/1985    Years since quitting: 33.1  . Smokeless tobacco: Never Used  Substance and Sexual Activity  . Alcohol use: Yes    Alcohol/week: 4.0 - 5.0 standard drinks    Types: 4 - 5 Cans of beer per week    Comment: 5 times per week  . Drug use: No  . Sexual activity: Yes    Partners: Female  Lifestyle  . Physical activity:    Days per week: Not on file    Minutes per session: Not on file  . Stress: Not on file  Relationships  . Social connections:    Talks on phone: Not on file    Gets together: Not on file    Attends religious service: Not on file  Active member of club or organization: Not on file    Attends meetings of clubs or organizations: Not on file    Relationship status: Not on file  . Intimate partner violence:    Fear of current or ex partner: Not on file    Emotionally abused: Not on file    Physically abused: Not on file    Forced sexual activity: Not on file  Other Topics Concern  . Not on file  Social History Narrative   Exercise-- 3 days   Pt has hs degree    Past Surgical History:  Procedure Laterality Date  . APPENDECTOMY    . ARTHOSCOPIC ROTAOR CUFF REPAIR Right 11/23/2018   Procedure: ARTHROSCOPIC ROTATOR CUFF REPAIR;  Surgeon: [name], MD;  Location: [facility];  Service: Orthopedics;  Laterality: Right;  . CHOLECYSTECTOMY    . LUMBAR LAMINECTOMY      . SHOULDER ARTHROSCOPY WITH ROTATOR CUFF REPAIR AND SUBACROMIAL DECOMPRESSION Right 11/23/2018   Procedure: SHOULDER ARTHROSCOPY WITH ROTATOR CUFF REPAIR AND SUBACROMIAL DECOMPRESSION;  Surgeon: [name], MD;  Location: [place];  Service: Orthopedics;  Laterality: Right;    Family History  Problem Relation Age of Onset  . Ovarian cancer Sister   . Stomach cancer Sister   . Heart disease Sister        MI  . Heart disease Sister        MI  . Alcohol abuse Unknown   . Depression Unknown   . Arthritis Unknown   . Hypertension Unknown   . Stomach cancer Maternal Grandmother   . COPD Mother   . Stroke Father   . Coronary artery disease Unknown   . Ovarian cancer Unknown        neice  . Ovarian cancer Unknown        neice  . Colon cancer Unknown     Allergies  Allergen Reactions  . Niacin Anaphylaxis    Current Outpatient Medications on File Prior to Visit  Medication Sig Dispense Refill  . atorvastatin (LIPITOR) 20 MG tablet Take 1 tablet (20 mg total) by mouth daily. 30 tablet 2  . BAYER CONTOUR TEST test strip Check Blood sugar once daily. Dx E11.9 50 each 12  . BAYER MICROLET LANCETS lancets Check Blood sugar once daily. Dx E11.9 50 each 11  . gabapentin (NEURONTIN) 300 MG capsule Take 1 capsule (300 mg total) by mouth 3 (three) times daily. 270 capsule 1  . glimepiride ([name]) 2 MG tablet Take 1 tablet (2 mg total) by mouth daily before breakfast. 30 tablet 2  . HYDROmorphone (DILAUDID) 2 MG tablet Take 1 tablet (2 mg total) by mouth every 4 (four) hours as needed for severe pain. 30 tablet 0  . lisinopril (PRINIVIL,ZESTRIL) 5 MG tablet TAKE 1 TABLET (5 MG TOTAL) BY MOUTH DAILY. 90 tablet 1  . metFORMIN (GLUCOPHAGE-XR) 500 MG 24 hr tablet TAKE TWO TABLETS BY MOUTH DAILY 180 tablet 1  . omeprazole (PRILOSEC) 40 MG capsule TAKE 1 CAPSULE (40 MG TOTAL) BY MOUTH DAILY. 90 capsule 3  . ondansetron (ZOFRAN) 4 MG tablet Take 1 tablet (4 mg total) by mouth  every 8 (eight) hours as needed. 20 tablet 0  . oxyCODONE-acetaminophen (PERCOCET/ROXICET) 5-325 MG tablet Take 1 tablet by mouth every 8 (eight) hours as needed for severe pain. 90 tablet 0  . sennosides-docusate sodium (SENOKOT-S) 8.6-50 MG tablet Take 2 tablets by mouth daily. 30 tablet 1  . sertraline (ZOLOFT) 100 MG  tablet TAKE 1 TABLET (100 MG TOTAL) BY MOUTH DAILY. 180 tablet 0  . tiZANidine (ZANAFLEX) 4 MG tablet Take 1 tablet (4 mg total) by mouth every 6 (six) hours as needed for muscle spasms. 30 tablet 1   No current facility-administered medications on file prior to visit.     BP (!) 149/83   Pulse 63   Temp 97.6 F (36.4 C) (Oral)   Resp 16   Ht 6' (1.829 m)   Wt 205 lb 9.6 oz (93.3 kg)   SpO2 100%   BMI 27.88 kg/m       Objective:   Physical Exam  General Appearance- Not in acute distress.  HEENT Eyes- Scleraeral/Conjuntiva-bilat- Not Yellow. Mouth & Throat- Normal.  Chest and Lung Exam Auscultation: Breath sounds:-Normal. Adventitious sounds:- No Adventitious sounds.  Cardiovascular Auscultation:Rythm - Regular. Heart Sounds -Normal heart sounds.  Abdomen Inspection:-Inspection Normal.  Palpation/Perucssion: Palpation and Percussion of the abdomen reveal-mild left flank tenderness to palpation as well as left lower quadrant tenderness, No Rebound tenderness, No rigidity(Guarding) and No Palpable abdominal masses.  Liver:-Normal.  Spleen:- Normal.   Back-mild left CVA tenderness to palpation presently.      Assessment & Plan:  You do describe some recent severe left costovertebral/kidney region pain over the last 2 days.  Also pain radiating to the flank.  With history of kidney stones, I do want to get a CT renal stone study is against kidney stone present.  Also will get CBC, CMP, amylase, lipase, PSA, and urine culture.  You do report some frequent urination and with above symptoms will prescribe Cipro antibiotic for 7 days pending results of  testing urine culture.  You report intermittent episodes of constipation and diarrhea.  Some nausea as well.  Will prescribe Zofran.  Make sure that she stay well-hydrated.  Decreased processed carbohydrates in your diet.  With alternating pattern of constipation and loose stools, I want you to get Metamucil over-the-counter use 1 rounded tablespoon in 8 ounces of water 3 times daily.  We will follow imaging result/CT.  They might give some information regarding a bowel pattern and amount of stool present.  Follow-up in 7 days or as needed.  40 minutes spent with pt. 50% of time spent counseling on his complaints today. Explained diff dx, work up and plan.  Mackie Pai, PA-C

## 2019-02-10 LAB — URINE CULTURE
MICRO NUMBER:: 185692
Result:: NO GROWTH
SPECIMEN QUALITY:: ADEQUATE

## 2019-02-16 ENCOUNTER — Ambulatory Visit (INDEPENDENT_AMBULATORY_CARE_PROVIDER_SITE_OTHER): Payer: Self-pay | Admitting: Medical

## 2019-02-16 ENCOUNTER — Encounter: Payer: Self-pay | Admitting: Medical

## 2019-02-16 ENCOUNTER — Ambulatory Visit (HOSPITAL_BASED_OUTPATIENT_CLINIC_OR_DEPARTMENT_OTHER)
Admission: RE | Admit: 2019-02-16 | Discharge: 2019-02-16 | Disposition: A | Discharge status: Home / Self Care | Payer: Medicaid Other | Source: Ambulatory Visit | Attending: Medical | Admitting: Medical

## 2019-02-16 VITALS — BP 157/86 | HR 65 | Temp 98.1°F | Resp 16 | Ht 72.0 in | Wt 201.8 lb

## 2019-02-16 DIAGNOSIS — G629 Polyneuropathy, unspecified: Secondary | ICD-10-CM

## 2019-02-16 DIAGNOSIS — R110 Nausea: Secondary | ICD-10-CM

## 2019-02-16 DIAGNOSIS — K219 Gastro-esophageal reflux disease without esophagitis: Secondary | ICD-10-CM

## 2019-02-16 DIAGNOSIS — R109 Unspecified abdominal pain: Secondary | ICD-10-CM | POA: Insufficient documentation

## 2019-02-16 DIAGNOSIS — R197 Diarrhea, unspecified: Secondary | ICD-10-CM

## 2019-02-16 LAB — CBC WITH DIFFERENTIAL/PLATELET
BASOS PCT: 0.8 % (ref 0.0–3.0)
Basophils Absolute: 0.1 10*3/uL (ref 0.0–0.1)
EOS ABS: 0.2 10*3/uL (ref 0.0–0.7)
Eosinophils Relative: 2.7 % (ref 0.0–5.0)
HCT: 45.1 % (ref 39.0–52.0)
HEMOGLOBIN: 16 g/dL (ref 13.0–17.0)
Lymphocytes Relative: 34.7 % (ref 12.0–46.0)
Lymphs Abs: 2.3 10*3/uL (ref 0.7–4.0)
MCHC: 35.5 g/dL (ref 30.0–36.0)
MCV: 80.5 fl (ref 78.0–100.0)
MONOS PCT: 9 % (ref 3.0–12.0)
Monocytes Absolute: 0.6 10*3/uL (ref 0.1–1.0)
NEUTROS ABS: 3.5 10*3/uL (ref 1.4–7.7)
Neutrophils Relative %: 52.8 % (ref 43.0–77.0)
Platelets: 187 10*3/uL (ref 150.0–400.0)
RBC: 5.6 Mil/uL (ref 4.22–5.81)
RDW: 12.9 % (ref 11.5–15.5)
WBC: 6.7 10*3/uL (ref 4.0–10.5)

## 2019-02-16 LAB — COMPREHENSIVE METABOLIC PANEL
ALBUMIN: 4.6 g/dL (ref 3.5–5.2)
ALT: 34 U/L (ref 0–53)
AST: 24 U/L (ref 0–37)
Alkaline Phosphatase: 75 U/L (ref 39–117)
BILIRUBIN TOTAL: 0.7 mg/dL (ref 0.2–1.2)
BUN: 8 mg/dL (ref 6–23)
CALCIUM: 10.1 mg/dL (ref 8.4–10.5)
CO2: 30 mEq/L (ref 19–32)
CREATININE: 0.88 mg/dL (ref 0.40–1.50)
Chloride: 100 mEq/L (ref 96–112)
GFR: 87.29 mL/min (ref >60.00)
Glucose, Bld: 170 mg/dL — ABNORMAL HIGH (ref 70–99)
Potassium: 3.9 mEq/L (ref 3.5–5.1)
Sodium: 137 mEq/L (ref 135–145)
Total Protein: 7.6 g/dL (ref 6.0–8.3)

## 2019-02-16 LAB — LIPASE: LIPASE: 10 U/L — AB (ref 11.0–59.0)

## 2019-02-16 LAB — AMYLASE: Amylase: 23 U/L — ABNORMAL LOW (ref 27–131)

## 2019-02-16 LAB — VITAMIN B12: VITAMIN B 12: 342 pg/mL (ref 211–911)

## 2019-02-16 NOTE — Patient Instructions (Signed)
Your recent work-up for left flank pain and abdomen pain so far does not reveal exact cause.  No kidney stones were found on prior/recent CT.  No diverticulitis was noted on the report but the study was done renal stone protocol.  Your symptoms have changed a little bit with more predominant diffuse abdomen pain and diarrhea following eating.  I want you to continue Cipro and Flagyl presently but do want you to turn in gastro panel today to work-up possible infectious causes.  CT report did mention some vague findings regarding pancreas and could not rule out pancreatitis based on imaging study alone.  So we will repeat your amylase and lipase today.  Also will get a CBC and a metabolic panel.  Continue with Zofran for nausea.  History of some GERD in the past and want you to continue omeprazole 40 mg tablet daily.  You could also add Pepcid/famotidine over-the-counter 2tablets a day.  I want you to get one view abdomen x-ray to assess bowel pattern and amount of stools that are present.  Make sure that you stay well-hydrated with beverage such as [employer]el [employer] water.  Eat bland foods as tolerated.    You mention that both feet have been burning over the last week.  Probable neuropathy flare.  You are already on gabapentin.  Will get B12 and B1 level today.  If signs and symptoms worsen/become severe recommend ED evaluation.  Did go ahead and make referral to see your gastroenterologist.  Asking if their office can see you early next week.  Follow-up in 7 days or as needed.

## 2019-02-16 NOTE — Progress Notes (Signed)
Subjective:    Patient ID: Ronald Petersen, male    DOB: [birth year], 63 y.o.   MRN: [id number]  HPI  Since last visit he states he has some severe bouts of abdomen pain mostly after he eats. He states will have loose water stools almost immediately. On last visit pain was mostly left flank area and some intermittent loose stools with alternating constipation pattern. But since last visit just loose stools. Pt still feeling nausea. Pt ct scan did not show any kidney stone. No report of diverticulitis.  Impression report showed: 1. No urologic calculus or obstructive uropathy. 2. Mildly indistinct appearance of the pancreatic parenchyma; mild acute pancreatitis is difficult to exclude. Recommend correlation with serum amylase and lipase. 3.  Aortic Atherosclerosis (ICD10-I70.0).  I initially rx'd cipro then after getting study back decided to add flagyl.  Pt is still nausea. Pt has been using anything for diarrhea.   Review of Systems  Constitutional: Negative for chills, fatigue and fever.  Respiratory: Negative for cough, chest tightness, shortness of breath and wheezing.   Cardiovascular: Negative for chest pain and palpitations.  Gastrointestinal: Positive for abdominal pain, diarrhea and nausea. Negative for constipation.  Musculoskeletal: Negative for back pain.  Neurological: Negative for dizziness, light-headedness, numbness and headaches.  Hematological: Negative for adenopathy. Does not bruise/bleed easily.  Psychiatric/Behavioral: Negative for behavioral problems and confusion.    Past Medical History:  Diagnosis Date  . Anxiety   . Arthritis   . Complication of anesthesia    aspirated with back surgery at age 43  . Depression   . Diabetes mellitus   . GERD (gastroesophageal reflux disease)   . Hyperlipidemia   . Hypertension   . Neuromuscular disorder ([place])    NEUROPATHY  . Right rotator cuff tear 11/23/2018  . Sleep apnea    no CPAP     Social History     Socioeconomic History  . Marital status: Married    Spouse name: Lorriane Shire  . Number of children: 2  . Years of education: Not on file  . Highest education level: Not on file  Occupational History  . Occupation: self employed    Fish farm manager: UNEMPLOYED  Social Needs  . Financial resource strain: Not on file  . Food insecurity:    Worry: Not on file    Inability: Not on file  . Transportation needs:    Medical: Not on file    Non-medical: Not on file  Tobacco Use  . Smoking status: Former Smoker    Years: 16.00    Types: Cigarettes    Last attempt to quit: 12/29/1985    Years since quitting: 33.1  . Smokeless tobacco: Never Used  Substance and Sexual Activity  . Alcohol use: Yes    Alcohol/week: 4.0 - 5.0 standard drinks    Types: 4 - 5 Cans of beer per week    Comment: 5 times per week  . Drug use: No  . Sexual activity: Yes    Partners: Female  Lifestyle  . Physical activity:    Days per week: Not on file    Minutes per session: Not on file  . Stress: Not on file  Relationships  . Social connections:    Talks on phone: Not on file    Gets together: Not on file    Attends religious service: Not on file    Active member of club or organization: Not on file    Attends meetings of clubs or organizations: Not on  file    Relationship status: Not on file  . Intimate partner violence:    Fear of current or ex partner: Not on file    Emotionally abused: Not on file    Physically abused: Not on file    Forced sexual activity: Not on file  Other Topics Concern  . Not on file  Social History Narrative   Exercise-- 3 days   Pt has hs degree    Past Surgical History:  Procedure Laterality Date  . APPENDECTOMY    . ARTHOSCOPIC ROTAOR CUFF REPAIR Right 11/23/2018   Procedure: ARTHROSCOPIC ROTATOR CUFF REPAIR;  Surgeon: [name], MD;  Location: [facility];  Service: Orthopedics;  Laterality: Right;  . CHOLECYSTECTOMY    . LUMBAR LAMINECTOMY    .  SHOULDER ARTHROSCOPY WITH ROTATOR CUFF REPAIR AND SUBACROMIAL DECOMPRESSION Right 11/23/2018   Procedure: SHOULDER ARTHROSCOPY WITH ROTATOR CUFF REPAIR AND SUBACROMIAL DECOMPRESSION;  Surgeon: [name], MD;  Location: [facility];  Service: Orthopedics;  Laterality: Right;    Family History  Problem Relation Age of Onset  . Ovarian cancer Sister   . Stomach cancer Sister   . Heart disease Sister        MI  . Heart disease Sister        MI  . Alcohol abuse Unknown   . Depression Unknown   . Arthritis Unknown   . Hypertension Unknown   . Stomach cancer Maternal Grandmother   . COPD Mother   . Stroke Father   . Coronary artery disease Unknown   . Ovarian cancer Unknown        neice  . Ovarian cancer Unknown        neice  . Colon cancer Unknown     Allergies  Allergen Reactions  . Niacin Anaphylaxis    Current Outpatient Medications on File Prior to Visit  Medication Sig Dispense Refill  . atorvastatin (LIPITOR) 20 MG tablet Take 1 tablet (20 mg total) by mouth daily. 30 tablet 2  . BAYER CONTOUR TEST test strip Check Blood sugar once daily. Dx E11.9 50 each 12  . BAYER MICROLET LANCETS lancets Check Blood sugar once daily. Dx E11.9 50 each 11  . ciprofloxacin (CIPRO) 500 MG tablet Take 1 tablet (500 mg total) by mouth 2 (two) times daily. 14 tablet 0  . gabapentin (NEURONTIN) 300 MG capsule Take 1 capsule (300 mg total) by mouth 3 (three) times daily. 270 capsule 1  . glimepiride ([name]) 2 MG tablet Take 1 tablet (2 mg total) by mouth daily before breakfast. 30 tablet 2  . HYDROmorphone (DILAUDID) 2 MG tablet Take 1 tablet (2 mg total) by mouth every 4 (four) hours as needed for severe pain. 30 tablet 0  . lisinopril (PRINIVIL,ZESTRIL) 5 MG tablet TAKE 1 TABLET (5 MG TOTAL) BY MOUTH DAILY. 90 tablet 1  . metFORMIN (GLUCOPHAGE-XR) 500 MG 24 hr tablet TAKE TWO TABLETS BY MOUTH DAILY 180 tablet 1  . metroNIDAZOLE (FLAGYL) 500 MG tablet Take 1 tablet (500 mg  total) by mouth 3 (three) times daily. 21 tablet 0  . omeprazole (PRILOSEC) 40 MG capsule TAKE 1 CAPSULE (40 MG TOTAL) BY MOUTH DAILY. 90 capsule 3  . ondansetron (ZOFRAN ODT) 4 MG disintegrating tablet Take 1 tablet (4 mg total) by mouth every 8 (eight) hours as needed for nausea or vomiting. 20 tablet 0  . ondansetron (ZOFRAN) 4 MG tablet Take 1 tablet (4 mg total) by mouth every 8 (eight)  hours as needed. 20 tablet 0  . oxyCODONE-acetaminophen (PERCOCET/ROXICET) 5-325 MG tablet Take 1 tablet by mouth every 8 (eight) hours as needed for severe pain. 90 tablet 0  . sennosides-docusate sodium (SENOKOT-S) 8.6-50 MG tablet Take 2 tablets by mouth daily. 30 tablet 1  . sertraline (ZOLOFT) 100 MG tablet TAKE 1 TABLET (100 MG TOTAL) BY MOUTH DAILY. 180 tablet 0  . tiZANidine (ZANAFLEX) 4 MG tablet Take 1 tablet (4 mg total) by mouth every 6 (six) hours as needed for muscle spasms. 30 tablet 1   No current facility-administered medications on file prior to visit.     BP (!) 157/86   Pulse 65   Temp 98.1 F (36.7 C) (Oral)   Resp 16   Ht 6' (1.829 m)   Wt 201 lb 12.8 oz (91.5 kg)   SpO2 100%   BMI 27.37 kg/m       Objective:   Physical Exam   General Appearance- Not in acute distress.  HEENT Eyes- Scleraeral/Conjuntiva-bilat- Not Yellow. Mouth & Throat- Normal.  Chest and Lung Exam Auscultation: Breath sounds:-Normal. Adventitious sounds:- No Adventitious sounds.  Cardiovascular Auscultation:Rythm - Regular. Heart Sounds -Normal heart sounds.  Abdomen Inspection:-Inspection Normal.  Palpation/Perucssion: Palpation and Percussion of the abdomen reveal- faint diffuse  Tender, No Rebound tenderness, No rigidity(Guarding) and No Palpable abdominal masses.  Liver:-Normal.  Spleen:- Normal.   Back- no cva pain.  Skin- left flank area shows not rash or vesicles.      Assessment & Plan:  Your recent work-up for left flank pain and abdomen pain so far does not reveal exact  cause.  No kidney stones were found on prior/recent CT.  No diverticulitis was noted on the report but the study was done renal stone protocol.  Your symptoms have changed a little bit with more predominant diffuse abdomen pain and diarrhea following eating.  I want you to continue Cipro and Flagyl presently but do want you to turn in gastro panel today to work-up possible infectious causes.  CT report did mention some vague findings regarding pancreas and could not rule out pancreatitis based on imaging study alone.  So we will repeat your amylase and lipase today.  Also will get a CBC and a metabolic panel.  Continue with Zofran for nausea.  History of some GERD in the past and want you to continue omeprazole 40 mg tablet daily.  You could also add Pepcid/famotidine over-the-counter 2tablets a day.  I want you to get one view abdomen x-ray to assess bowel pattern and amount of stools that are present.  Make sure that you stay well-hydrated with beverage such as [employer]el fitness water.  Eat bland foods as tolerated.    You mention that both feet have been burning over the last week.  Probable neuropathy flare.  You are already on gabapentin.  Will get B12 and B1 level today.  If signs and symptoms worsen/become severe recommend ED evaluation.  Did go ahead and make referral to see your gastroenterologist.  Asking if their office can see you early next week.  Follow-up in 7 days or as needed.     Mackie Pai, PA-C

## 2019-02-18 ENCOUNTER — Ambulatory Visit: Payer: Medicaid Other | Admitting: Gastroenterology

## 2019-02-19 LAB — VITAMIN B1: VITAMIN B1 (THIAMINE): 7 nmol/L — AB (ref 8–30)

## 2019-02-23 ENCOUNTER — Ambulatory Visit: Payer: Self-pay | Admitting: Medical

## 2019-03-02 ENCOUNTER — Other Ambulatory Visit: Payer: Self-pay | Admitting: Family Medicine

## 2019-03-02 DIAGNOSIS — G8929 Other chronic pain: Secondary | ICD-10-CM

## 2019-03-02 NOTE — Telephone Encounter (Signed)
Requesting: Oxycodone-actaminophen Contract: Yes, 08/03/2018 UDS: No, low risk--next screening 02/03/2019 Last OV: 02/16/2019 Next OV: N/A Last Refill: 01/17/2019, #90-- 0 RF Database:   Please advise

## 2019-03-03 MED FILL — OXYCODONE-ACETAMINOPHEN 5-3: 5-325 | 30 days supply | Qty: 90 | Fill #0

## 2019-03-25 ENCOUNTER — Ambulatory Visit: Payer: Medicaid Other | Admitting: Gastroenterology

## 2019-03-29 MED FILL — GABAPENTIN 300 MG CAPSULE: 300 | 90 days supply | Qty: 270 | Fill #1

## 2019-04-04 ENCOUNTER — Other Ambulatory Visit: Payer: Self-pay | Admitting: Family Medicine

## 2019-04-04 DIAGNOSIS — G8929 Other chronic pain: Secondary | ICD-10-CM

## 2019-04-06 ENCOUNTER — Ambulatory Visit: Payer: Medicaid Other | Admitting: Neurology

## 2019-04-06 NOTE — Telephone Encounter (Signed)
Last written: 03/02/19 Last ov: 01/04/19 Next ov: will call to schedule Contract: 08/04/19 UDS: due  Left message for patient to call to schedule virtual visit for follow up chronic pain

## 2019-04-08 MED FILL — tiZANidine HCL 4 MG TABS: 4 | 8 days supply | Qty: 30 | Fill #1

## 2019-04-08 MED FILL — LISINOPRIL 5 MG TABLET: 5 | 90 days supply | Qty: 90 | Fill #1

## 2019-04-08 MED FILL — OXYCODONE-ACETAMINOPHEN 5-3: 5-325 | 30 days supply | Qty: 90 | Fill #0

## 2019-04-08 MED FILL — OMEPRAZOLE 40 MG CPDR: 40 | 90 days supply | Qty: 90 | Fill #1

## 2019-04-12 ENCOUNTER — Telehealth: Payer: Self-pay | Admitting: Family Medicine

## 2019-04-12 ENCOUNTER — Other Ambulatory Visit: Payer: Self-pay

## 2019-04-12 ENCOUNTER — Ambulatory Visit (INDEPENDENT_AMBULATORY_CARE_PROVIDER_SITE_OTHER): Payer: Medicaid Other | Admitting: Family Medicine

## 2019-04-12 ENCOUNTER — Encounter: Payer: Self-pay | Admitting: Family Medicine

## 2019-04-12 DIAGNOSIS — I10 Essential (primary) hypertension: Secondary | ICD-10-CM

## 2019-04-12 DIAGNOSIS — E1165 Type 2 diabetes mellitus with hyperglycemia: Secondary | ICD-10-CM

## 2019-04-12 DIAGNOSIS — G8929 Other chronic pain: Secondary | ICD-10-CM

## 2019-04-12 DIAGNOSIS — E785 Hyperlipidemia, unspecified: Secondary | ICD-10-CM

## 2019-04-12 DIAGNOSIS — E1169 Type 2 diabetes mellitus with other specified complication: Secondary | ICD-10-CM

## 2019-04-12 DIAGNOSIS — M2550 Pain in unspecified joint: Secondary | ICD-10-CM

## 2019-04-12 MED ORDER — GABAPENTIN 600 MG PO TABS: 600.0000 mg | ORAL_TABLET | Freq: Three times a day (TID) | ORAL | 5 refills | Status: DC

## 2019-04-12 MED ORDER — OXYCODONE-ACETAMINOPHEN 5-325 MG PO TABS: ORAL_TABLET | ORAL | 0 refills | Status: DC

## 2019-04-12 NOTE — Telephone Encounter (Signed)
Thank you :)

## 2019-04-12 NOTE — Progress Notes (Signed)
Virtual Visit via Video Note  I connected with Daviel Allegretto Lilley on 04/12/19 at  1:30 PM EDT by a video enabled telemedicine application and verified that I am speaking with the correct person using two identifiers.   I discussed the limitations of evaluation and management by telemedicine and the availability of in person appointments. The patient expressed understanding and agreed to proceed.  History of Present Illness: Pt is home with his wife and needs f/u and labs He also needs a new referral for his neck.    Observations/Objective: bs 208  127/72  p 57  203  Ht 6 ft Pt in NAD rr normal  Assessment and Plan: 1. Other chronic pain Refill meds-- pt waitning to see ns  - oxyCODONE-acetaminophen (PERCOCET/ROXICET) 5-325 MG tablet; TAKE 1 TABLET BY MOUTH EVERY 8 HOURS AS NEEDED FOR SEVERE PAIN  Dispense: 90 tablet; Refill: 0  2. Chronic pain - gabapentin (NEURONTIN) 600 MG tablet; Take 1 tablet (600 mg total) by mouth 3 (three) times daily.  Dispense: 90 tablet; Refill: 5  3. Joint pain   4. Type 2 diabetes mellitus with hyperglycemia, without long-term current use of insulin (HCC) hgba1c to be checked , minimize simple carbs. Increase exercise as tolerated. Continue current meds  - Hemoglobin A1c; Future - Microalbumin / creatinine urine ratio; Future  5. Hyperlipidemia associated with type 2 diabetes mellitus ([place]) Tolerating statin, encouraged heart healthy diet, avoid trans fats, minimize simple carbs and saturated fats. Increase exercise as tolerated - Lipid panel; Future - Comprehensive metabolic panel; Future  6. Essential hypertension Well controlled, no changes to meds. Encouraged heart healthy diet such as the DASH diet and exercise as tolerated.  - Comprehensive metabolic panel; Future   Follow Up Instructions:    I discussed the assessment and treatment plan with the patient. The patient was provided an opportunity to ask questions and all were answered. The  patient agreed with the plan and demonstrated an understanding of the instructions.   The patient was advised to call back or seek an in-person evaluation if the symptoms worsen or if the condition fails to improve as anticipated.     [name], DO

## 2019-04-12 NOTE — Telephone Encounter (Signed)
Called and left VM letting Quita Skye know I scheduled him at [facility]

## 2019-04-14 ENCOUNTER — Other Ambulatory Visit: Payer: Self-pay

## 2019-04-25 ENCOUNTER — Telehealth: Payer: Self-pay | Admitting: *Deleted

## 2019-04-25 NOTE — Telephone Encounter (Signed)
Received request for Medical Records from [place]; forwarded to Medical Records via email/scan/SLS

## 2019-05-10 ENCOUNTER — Other Ambulatory Visit: Payer: Self-pay | Admitting: Family Medicine

## 2019-05-10 DIAGNOSIS — G8929 Other chronic pain: Secondary | ICD-10-CM

## 2019-05-10 MED FILL — OXYCODONE-ACETAMINOPHEN 5-3: 5-325 | 30 days supply | Qty: 90 | Fill #0

## 2019-05-11 MED FILL — tiZANidine HCL 4 MG TABS: 4 | 8 days supply | Qty: 30 | Fill #0

## 2019-05-11 NOTE — Telephone Encounter (Signed)
If patient taking Tizanidine along with Oxycodone? Please advise.

## 2019-05-19 ENCOUNTER — Encounter: Payer: Self-pay | Admitting: Family Medicine

## 2019-05-19 ENCOUNTER — Ambulatory Visit (INDEPENDENT_AMBULATORY_CARE_PROVIDER_SITE_OTHER): Payer: Self-pay | Admitting: Family Medicine

## 2019-05-19 ENCOUNTER — Other Ambulatory Visit: Payer: Self-pay

## 2019-05-19 VITALS — Wt 205.0 lb

## 2019-05-19 DIAGNOSIS — M5442 Lumbago with sciatica, left side: Secondary | ICD-10-CM

## 2019-05-19 DIAGNOSIS — M542 Cervicalgia: Secondary | ICD-10-CM

## 2019-05-19 DIAGNOSIS — R1032 Left lower quadrant pain: Secondary | ICD-10-CM

## 2019-05-19 DIAGNOSIS — E1165 Type 2 diabetes mellitus with hyperglycemia: Secondary | ICD-10-CM

## 2019-05-19 DIAGNOSIS — G8929 Other chronic pain: Secondary | ICD-10-CM

## 2019-05-19 DIAGNOSIS — M5441 Lumbago with sciatica, right side: Secondary | ICD-10-CM

## 2019-05-19 MED ORDER — AMOXICILLIN-POT CLAVULANATE 875-125 MG PO TABS: 1.0000 | ORAL_TABLET | Freq: Two times a day (BID) | ORAL | 0 refills | Status: DC

## 2019-05-19 MED ORDER — PREDNISONE 10 MG PO TABS: ORAL_TABLET | ORAL | 0 refills | Status: DC

## 2019-05-19 MED ORDER — INSULIN PEN NEEDLE 31G X 5 MM MISC: 0 refills | Status: DC

## 2019-05-19 MED ORDER — INSULIN ASPART 100 UNIT/ML FLEXPEN: PEN_INJECTOR | SUBCUTANEOUS | 11 refills | Status: DC

## 2019-05-19 MED ORDER — GLUCOSE BLOOD VI STRP: ORAL_STRIP | 12 refills | Status: DC

## 2019-05-19 MED FILL — CONTOUR NEXT STRIPS: 50 days supply | Qty: 100 | Fill #0

## 2019-05-19 MED FILL — predniSONE 10 MG TABS: 10 | 12 days supply | Qty: 20 | Fill #0

## 2019-05-19 MED FILL — AMOX-CLAV 875-125 MG TABLET: 875-125 | 10 days supply | Qty: 20 | Fill #0

## 2019-05-19 NOTE — Progress Notes (Signed)
Virtual Visit via Video Note  I connected with Ronald Petersen on 05/19/19 at  8:30 AM EDT by a video enabled telemedicine application and verified that I am speaking with the correct person using two identifiers.  Location: Patient: home  Provider: home   I discussed the limitations of evaluation and management by telemedicine and the availability of in person appointments. The patient expressed understanding and agreed to proceed.  History of Present Illness:  Pt is home c/o worsening neck and low back pain--  He had sliding scale through [facility] and is desperate to see someond about it now Disability still has not come through He also still has LLQ pain ---he had a ct to r/o kidney stone  No diarrhea, no fever---pain comes and goes.  Started in February    Observations/Objective: Unable to get vitals Pt in NAD  Assessment and Plan: 1. Neck pain Worsening neck pain and pain is making him dizzy--- will give sliding scale insulin and start pred taper Refer to ortho  - Ambulatory referral to Orthopedic Surgery - predniSONE (DELTASONE) 10 MG tablet; TAKE 3 TABLETS PO QD FOR 3 DAYS THEN TAKE 2 TABLETS PO QD FOR 3 DAYS THEN TAKE 1 TABLET PO QD FOR 3 DAYS THEN TAKE 1/2 TAB PO QD FOR 3 DAYS  Dispense: 20 tablet; Refill: 0  2. Chronic low back pain with bilateral sciatica, unspecified back pain laterality pred taper-- ss insulin and refer to ortho Pain worsening  - Ambulatory referral to Orthopedic Surgery - predniSONE (DELTASONE) 10 MG tablet; TAKE 3 TABLETS PO QD FOR 3 DAYS THEN TAKE 2 TABLETS PO QD FOR 3 DAYS THEN TAKE 1 TABLET PO QD FOR 3 DAYS THEN TAKE 1/2 TAB PO QD FOR 3 DAYS  Dispense: 20 tablet; Refill: 0  3. Left lower quadrant abdominal pain Diarrhea stopped Pain cont --- ? Diverticulitis  abx per orders and check CT abd and labs  - CT Abdomen Pelvis W Contrast; Future - CBC with Differential/Platelet; Future - amoxicillin-clavulanate (AUGMENTIN) 875-125 MG tablet; Take 1  tablet by mouth 2 (two) times daily.  Dispense: 20 tablet; Refill: 0  4. Type 2 diabetes mellitus with hyperglycemia, without long-term current use of insulin (HCC) Check labs  hgba1c to be checked , minimize simple carbs. Increase exercise as tolerated. Continue current meds - insulin aspart (NOVOLOG) 100 UNIT/ML FlexPen; Per sliding scale-- max daily dose 30 u  Dispense: 15 mL; Refill: 11 - glucose blood test strip; Use as instructed  Dispense: 100 each; Refill: 12 - Insulin Pen Needle 31G X 5 MM MISC; Sliding scale  Dispense: 50 each; Refill: 0   Follow Up Instructions:    I discussed the assessment and treatment plan with the patient. The patient was provided an opportunity to ask questions and all were answered. The patient agreed with the plan and demonstrated an understanding of the instructions.   The patient was advised to call back or seek an in-person evaluation if the symptoms worsen or if the condition fails to improve as anticipated.  I provided 25 minutes of non-face-to-face time during this encounter.   [name], DO

## 2019-05-20 ENCOUNTER — Other Ambulatory Visit (INDEPENDENT_AMBULATORY_CARE_PROVIDER_SITE_OTHER): Payer: Self-pay

## 2019-05-20 ENCOUNTER — Encounter: Payer: Self-pay | Admitting: Orthopaedic Surgery

## 2019-05-20 ENCOUNTER — Ambulatory Visit (INDEPENDENT_AMBULATORY_CARE_PROVIDER_SITE_OTHER): Payer: Medicaid Other

## 2019-05-20 ENCOUNTER — Ambulatory Visit (INDEPENDENT_AMBULATORY_CARE_PROVIDER_SITE_OTHER): Payer: Medicaid Other | Admitting: Orthopaedic Surgery

## 2019-05-20 ENCOUNTER — Other Ambulatory Visit: Payer: Self-pay

## 2019-05-20 VITALS — Ht 71.0 in | Wt 205.0 lb

## 2019-05-20 DIAGNOSIS — M542 Cervicalgia: Secondary | ICD-10-CM

## 2019-05-20 DIAGNOSIS — R1032 Left lower quadrant pain: Secondary | ICD-10-CM

## 2019-05-20 DIAGNOSIS — E785 Hyperlipidemia, unspecified: Secondary | ICD-10-CM

## 2019-05-20 DIAGNOSIS — E1169 Type 2 diabetes mellitus with other specified complication: Secondary | ICD-10-CM

## 2019-05-20 DIAGNOSIS — M545 Low back pain, unspecified: Secondary | ICD-10-CM | POA: Insufficient documentation

## 2019-05-20 DIAGNOSIS — I10 Essential (primary) hypertension: Secondary | ICD-10-CM

## 2019-05-20 DIAGNOSIS — E1165 Type 2 diabetes mellitus with hyperglycemia: Secondary | ICD-10-CM

## 2019-05-20 DIAGNOSIS — G8929 Other chronic pain: Secondary | ICD-10-CM

## 2019-05-20 DIAGNOSIS — M4722 Other spondylosis with radiculopathy, cervical region: Secondary | ICD-10-CM

## 2019-05-20 DIAGNOSIS — G5603 Carpal tunnel syndrome, bilateral upper limbs: Secondary | ICD-10-CM

## 2019-05-20 LAB — COMPREHENSIVE METABOLIC PANEL
ALT: 19 U/L (ref 0–53)
AST: 12 U/L (ref 0–37)
Albumin: 4.9 g/dL (ref 3.5–5.2)
Alkaline Phosphatase: 94 U/L (ref 39–117)
BUN: 17 mg/dL (ref 6–23)
CO2: 29 mEq/L (ref 19–32)
Calcium: 9.8 mg/dL (ref 8.4–10.5)
Chloride: 98 mEq/L (ref 96–112)
Creatinine, Ser: 0.89 mg/dL (ref 0.40–1.50)
GFR: 86.09 mL/min (ref >60.00)
Glucose, Bld: 219 mg/dL — ABNORMAL HIGH (ref 70–99)
Potassium: 3.8 mEq/L (ref 3.5–5.1)
Sodium: 137 mEq/L (ref 135–145)
Total Bilirubin: 0.6 mg/dL (ref 0.2–1.2)
Total Protein: 7.7 g/dL (ref 6.0–8.3)

## 2019-05-20 LAB — CBC WITH DIFFERENTIAL/PLATELET
Basophils Absolute: 0.1 10*3/uL (ref 0.0–0.1)
Basophils Relative: 0.7 % (ref 0.0–3.0)
Eosinophils Absolute: 0.1 10*3/uL (ref 0.0–0.7)
Eosinophils Relative: 1.7 % (ref 0.0–5.0)
HCT: 43.1 % (ref 39.0–52.0)
Hemoglobin: 15.4 g/dL (ref 13.0–17.0)
Lymphocytes Relative: 32 % (ref 12.0–46.0)
Lymphs Abs: 2.6 10*3/uL (ref 0.7–4.0)
MCHC: 35.6 g/dL (ref 30.0–36.0)
MCV: 80.5 fl (ref 78.0–100.0)
Monocytes Absolute: 0.5 10*3/uL (ref 0.1–1.0)
Monocytes Relative: 6.8 % (ref 3.0–12.0)
Neutro Abs: 4.7 10*3/uL (ref 1.4–7.7)
Neutrophils Relative %: 58.8 % (ref 43.0–77.0)
Platelets: 192 10*3/uL (ref 150.0–400.0)
RBC: 5.36 Mil/uL (ref 4.22–5.81)
RDW: 13.1 % (ref 11.5–15.5)
WBC: 8 10*3/uL (ref 4.0–10.5)

## 2019-05-20 LAB — LIPID PANEL
Cholesterol: 246 mg/dL — ABNORMAL HIGH (ref 0–200)
HDL: 34 mg/dL — ABNORMAL LOW (ref >39.00)
NonHDL: 212.48
Total CHOL/HDL Ratio: 7
Triglycerides: 313 mg/dL — ABNORMAL HIGH (ref 0.0–149.0)
VLDL: 62.6 mg/dL — ABNORMAL HIGH (ref 0.0–40.0)

## 2019-05-20 LAB — MICROALBUMIN / CREATININE URINE RATIO
Creatinine,U: 124.3 mg/dL
Microalb Creat Ratio: 6.1 mg/g (ref 0.0–30.0)
Microalb, Ur: 7.6 mg/dL — ABNORMAL HIGH (ref 0.0–1.9)

## 2019-05-20 LAB — LDL CHOLESTEROL, DIRECT: Direct LDL: 145 mg/dL

## 2019-05-20 LAB — HEMOGLOBIN A1C: Hgb A1c MFr Bld: 9.7 % — ABNORMAL HIGH (ref 4.6–6.5)

## 2019-05-20 NOTE — Progress Notes (Signed)
Office Visit Note   Patient: Ronald Petersen           Date of Birth: May 29, 1956           MRN: [id number] Visit Date: 05/20/2019              Requested by: [address], [place], [place] [address] [place], [place] [address] PCP: [name], Alferd Apa, DO   Assessment & Plan: Visit Diagnoses:  1. Neck pain   2. Other spondylosis with radiculopathy, cervical region   3. Carpal tunnel syndrome, bilateral   4. Chronic bilateral low back pain without sciatica     Plan: We discussed some home stretching exercises for him to work on his cervical spine.  I will check him back in 1 month if he is having persistent problems and diagnostic cervical MRI scan will be considered.  Follow-Up Instructions: No follow-ups on file.   Orders:  Orders Placed This Encounter  Procedures  . XR Cervical Spine 2 or 3 views   No orders of the defined types were placed in this encounter.     Procedures: No procedures performed   Clinical Data: No additional findings.   Subjective: Chief Complaint  Patient presents with  . Lower Back - Pain  . Neck - Pain    HPI [age]-year-old male with chronic neck and back pain.  He had previous lumbar surgery at age 54 for ruptured disc.  He was told he needed neck surgery several years ago for spondylosis has had longstanding carpal tunnel syndrome bilaterally surgery was recommended for that area more than 15 years ago.  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  He is also been on some ibuprofen.  His neck pain is worse than his back pain.  Pains in his neck radiates posteriorly into the head with associated headaches pain radiates into his arms and down to the radial side right side of his neck and radial side of his hands.  He is try to be more active.  Hand pain wakes him up at night he shakes his hands.  He is used a wrist splints in the past  without relief.  He is gone through chiropractic adjustments anti-inflammatories.  Patient past smoker.  MRI scan in 2016 showed significant spinal stenosis with potential left-sided neural impingement at C3-4 C5-6 and C6-7 with central and left side protrusions.  Slight for cord flattening was noted at C5-6.  Lumbar x-rays January 2020 showed facet arthropathy multiple levels without spondylolisthesis.  Some aortic atherosclerosis.  Negative for acute changes.  No associated bowel or bladder symptoms.  Denies fever or chills.  Patient states when he rotates his neck right and left sometimes quickly feels like he is going to blackout from severe pain.  He is able to flex his neck and extend it without severe pain.  Previous right rotator cuff surgery by Dr. Onnie Graham few years ago continues to do well.  He has diabetes with some neuropathy and is on metformin.  Last A1c I see in the chart was above 8.  Review of Systems 14 point is systems positive for past smoker, right rotator cuff repair, cervical spondylosis, type 2 diabetes on medication, hyperglycemia, depression, chronic low back pain otherwise -14 point systems as it pertains HPI.   Objective: Vital Signs: Ht 5\' 11"  (1.803 m)   Wt  205 lb (93 kg)   BMI 28.59 kg/m   Physical Exam Constitutional:      Appearance: He is well-developed.  HENT:     Head: Normocephalic and atraumatic.  Eyes:     Pupils: Pupils are equal, round, and reactive to light.  Neck:     Thyroid: No thyromegaly.     Trachea: No tracheal deviation.  Cardiovascular:     Rate and Rhythm: Normal rate.  Pulmonary:     Effort: Pulmonary effort is normal.     Breath sounds: No wheezing.  Abdominal:     General: Bowel sounds are normal.     Palpations: Abdomen is soft.  Skin:    General: Skin is warm and dry.     Capillary Refill: Capillary refill takes less than 2 seconds.  Neurological:     Mental Status: He is alert and oriented to person, place, and time.   Psychiatric:        Behavior: Behavior normal.        Thought Content: Thought content normal.        Judgment: Judgment normal.     Ortho Exam patient has bilateral brachial plexus tenderness positive Spurling right and left.  Thenar atrophy right and left.  Biceps brachioradialis are trace triceps is 2+.  No interosseous weakness.  Good flexion-extension cervical spine increased pain with cervical compression.  Lower extremity reflexes are 2+ negative logroll to the hips.  No plantar foot lesions.  He has 2+ left dorsalis pedis trace posterior tib.  Right side 1+ dorsalis pedis 1+ posterior tib.  EHL anterior tib gastrocsoleus is strong normal heel toe gait.  Positive Phalen's positive carpal compression test right and left.  Specialty Comments:  No specialty comments available.  Imaging: No results found.   PMFS History: Patient Active Problem List   Diagnosis Date Noted  . Chronic bilateral low back pain without sciatica 05/20/2019  . Carpal tunnel syndrome, bilateral 05/20/2019  . Other spondylosis with radiculopathy, cervical region 05/20/2019  . Leg weakness, bilateral 01/05/2019  . Falling episodes 01/05/2019  . Right rotator cuff tear 11/23/2018  . Neck pain 11/04/2018  . Acute pain of right shoulder 11/04/2018  . Gastroesophageal reflux disease 11/04/2018  . Hyperlipidemia associated with type 2 diabetes mellitus ([place]) 11/04/2018  . 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 Essential hypertension 09/14/2017  . Preventative health care 06/12/2016  . OSA (obstructive sleep apnea) 04/07/2016  . Joint pain 04/05/2016  . NSAID long-term use 12/13/2015  . Nausea without vomiting 12/13/2015  . History of colonic polyps 12/13/2015  . Loss of weight 12/13/2015  . Depression 11/06/2015  . Metatarsal deformity 02/20/2015  . Equinus deformity of foot, acquired 02/20/2015  . Plantar fasciitis, bilateral 02/15/2015  . Left wrist injury  06/15/2014  . Peripheral neuropathy 04/01/2012  . FATIGUE 11/05/2010  . OTHER SPECIFIED DISORDER OF PENIS 03/26/2010  . NAUSEA 03/26/2010  . Pain in limb 10/17/2008  . NECK PAIN, CHRONIC 11/24/2007  . DM (diabetes mellitus) type II uncontrolled, periph vascular disorder ([place]) 01/27/2007  . Hyperlipidemia LDL goal <70 01/27/2007   Past Medical History:  Diagnosis Date  . Anxiety   . Arthritis   . Complication of anesthesia    aspirated with back surgery at age 41  . Depression   . Diabetes mellitus   . GERD (gastroesophageal reflux disease)   . Hyperlipidemia   . Hypertension   . Neuromuscular disorder ([place])  NEUROPATHY  . Right rotator cuff tear 11/23/2018  . Sleep apnea    no CPAP    Family History  Problem Relation Age of Onset  . Ovarian cancer Sister   . Stomach cancer Sister   . Heart disease Sister        MI  . Heart disease Sister        MI  . Alcohol abuse Unknown   . Depression Unknown   . Arthritis Unknown   . Hypertension Unknown   . Stomach cancer Maternal Grandmother   . COPD Mother   . Stroke Father   . Coronary artery disease Unknown   . Ovarian cancer Unknown        neice  . Ovarian cancer Unknown        neice  . Colon cancer Unknown     Past Surgical History:  Procedure Laterality Date  . APPENDECTOMY    . ARTHOSCOPIC ROTAOR CUFF REPAIR Right 11/23/2018   Procedure: ARTHROSCOPIC ROTATOR CUFF REPAIR;  Surgeon: [name], MD;  Location: [facility];  Service: Orthopedics;  Laterality: Right;  . CHOLECYSTECTOMY    . LUMBAR LAMINECTOMY    . SHOULDER ARTHROSCOPY WITH ROTATOR CUFF REPAIR AND SUBACROMIAL DECOMPRESSION Right 11/23/2018   Procedure: SHOULDER ARTHROSCOPY WITH ROTATOR CUFF REPAIR AND SUBACROMIAL DECOMPRESSION;  Surgeon: [name], MD;  Location: [facility];  Service: Orthopedics;  Laterality: Right;   Social History   Occupational History  . Occupation: self employed    Fish farm manager: UNEMPLOYED   Tobacco Use  . Smoking status: Former Smoker    Years: 16.00    Types: Cigarettes    Last attempt to quit: 12/29/1985    Years since quitting: 33.4  . Smokeless tobacco: Never Used  Substance and Sexual Activity  . Alcohol use: Yes    Alcohol/week: 4.0 - 5.0 standard drinks    Types: 4 - 5 Cans of beer per week    Comment: 5 times per week  . Drug use: No  . Sexual activity: Yes    Partners: Female

## 2019-05-24 ENCOUNTER — Ambulatory Visit: Payer: Self-pay | Admitting: *Deleted

## 2019-05-24 ENCOUNTER — Ambulatory Visit (INDEPENDENT_AMBULATORY_CARE_PROVIDER_SITE_OTHER): Payer: Self-pay | Admitting: Family Medicine

## 2019-05-24 ENCOUNTER — Other Ambulatory Visit: Payer: Self-pay

## 2019-05-24 ENCOUNTER — Other Ambulatory Visit: Payer: Self-pay | Admitting: Family Medicine

## 2019-05-24 ENCOUNTER — Encounter: Payer: Self-pay | Admitting: Family Medicine

## 2019-05-24 DIAGNOSIS — E785 Hyperlipidemia, unspecified: Secondary | ICD-10-CM

## 2019-05-24 DIAGNOSIS — E1169 Type 2 diabetes mellitus with other specified complication: Secondary | ICD-10-CM

## 2019-05-24 DIAGNOSIS — E1165 Type 2 diabetes mellitus with hyperglycemia: Secondary | ICD-10-CM

## 2019-05-24 MED ORDER — GLIMEPIRIDE 2 MG PO TABS: 2.0000 mg | ORAL_TABLET | Freq: Every day | ORAL | 2 refills | Status: DC

## 2019-05-24 MED ORDER — PRAVASTATIN SODIUM 40 MG PO TABS: 40.0000 mg | ORAL_TABLET | Freq: Every day | ORAL | 3 refills | Status: DC

## 2019-05-24 MED FILL — GLIMEPIRIDE 2 MG TABLET: 2 | 30 days supply | Qty: 30 | Fill #0

## 2019-05-24 MED FILL — PRAVASTATIN NA 40 MG TAB: 40 | 90 days supply | Qty: 90 | Fill #0

## 2019-05-24 NOTE — Telephone Encounter (Signed)
Virtual visit scheduled.  

## 2019-05-24 NOTE — Telephone Encounter (Signed)
Summary: Clinical Advice   Patient is concerned about the prednisone he has been taking. His blood sugar is currently 187 and would like to speak with a nurse about this.     Patient states he took one dose of prednisone and he states it sent his glucose thru the roof- into 200-300- quit prednisone. ( glucose did not go up all the sudden- it went up slowly and patient has not been able to get it down) Patient was not able to afford the insulin pin to use with the prednisone.   Reason for Disposition . [1] Caller has URGENT medication or insulin pump question AND [2] triager unable to answer question    Patient is calling with concerns of glucose being too high and not being able to get it back down.  Answer Assessment - Initial Assessment Questions 1. BLOOD GLUCOSE: "What is your blood glucose level?"      198 2. ONSET: "When did you check the blood glucose?"     30 minutes ago 3. USUAL RANGE: "What is your glucose level usually?" (e.g., usual fasting morning value, usual evening value)     130-150 4. KETONES: "Do you check for ketones (urine or blood test strips)?" If yes, ask: "What does the test show now?"      no 5. TYPE 1 or 2:  "Do you know what type of diabetes you have?"  (e.g., Type 1, Type 2, Gestational; doesn't know)      Type 2 6. INSULIN: "Do you take insulin?" "What type of insulin(s) do you use? What is the mode of delivery? (syringe, pen (e.g., injection or  pump)?"      No  7. DIABETES PILLS: "Do you take any pills for your diabetes?" If yes, ask: "Have you missed taking any pills recently?"     metformin 8. OTHER SYMPTOMS: "Do you have any symptoms?" (e.g., fever, frequent urination, difficulty breathing, dizziness, weakness, vomiting)     Lack of energy, dizziness, face feels numb 9. PREGNANCY: "Is there any chance you are pregnant?" "When was your last menstrual period?"     n/a  Protocols used: DIABETES - HIGH BLOOD SUGAR-A-AH

## 2019-05-24 NOTE — Progress Notes (Signed)
Virtual Visit via Video Note  I connected with Ronald Petersen on 05/24/19 at  3:00 PM EDT by a video enabled telemedicine application and verified that I am speaking with the correct person using two identifiers.  Location: Patient: home Provider: office    I discussed the limitations of evaluation and management by telemedicine and the availability of in person appointments. The patient expressed understanding and agreed to proceed.  History of Present Illness:   pt is home c/o glucose running high after starting prednisone.  -- he was feeling bad and sugars still running high althought they are slowly coming down.  Pt could not afford the insulin to do the sliding scale.  Pt states there was also confusion in ortho office about his ins.  Pt states he is on [facility] payment plan but people keep saying he has medicaid.  If something is not done with his neck/back but end of July he will have to pay out of pocket or put it off longer.   Observations/Objective: No vitals obtained Pt in NAD  Assessment and Plan: 1. Hyperlipidemia associated with type 2 diabetes mellitus ([place]) Encouraged heart healthy diet, increase exercise, avoid trans fats, consider a krill oil cap daily - pravastatin (PRAVACHOL) 40 MG tablet; Take 1 tablet (40 mg total) by mouth daily.  Dispense: 90 tablet; Refill: 3 Pt unable to tol lipitor--  Start pravachol Call if myalgias return  2. Uncontrolled type 2 diabetes mellitus with hyperglycemia ([place]) Start amaryl 1 po qd  con't metformin   - glimepiride ([name]) 2 MG tablet; Take 1 tablet (2 mg total) by mouth daily before breakfast.  Dispense: 30 tablet; Refill: 2   Follow Up Instructions:    I discussed the assessment and treatment plan with the patient. The patient was provided an opportunity to ask questions and all were answered. The patient agreed with the plan and demonstrated an understanding of the instructions.   The patient was advised to call back or seek  an in-person evaluation if the symptoms worsen or if the condition fails to improve as anticipated.  I provided 25 minutes of non-face-to-face time during this encounter.   [name], DO

## 2019-05-27 ENCOUNTER — Encounter (HOSPITAL_BASED_OUTPATIENT_CLINIC_OR_DEPARTMENT_OTHER): Payer: Self-pay

## 2019-05-27 ENCOUNTER — Encounter: Payer: Self-pay | Admitting: Family Medicine

## 2019-05-27 ENCOUNTER — Ambulatory Visit (HOSPITAL_BASED_OUTPATIENT_CLINIC_OR_DEPARTMENT_OTHER)
Admission: RE | Admit: 2019-05-27 | Discharge: 2019-05-27 | Disposition: A | Discharge status: Home / Self Care | Payer: Self-pay | Source: Ambulatory Visit | Attending: Family Medicine | Admitting: Family Medicine

## 2019-05-27 ENCOUNTER — Other Ambulatory Visit: Payer: Self-pay

## 2019-05-27 DIAGNOSIS — I700 Atherosclerosis of aorta: Secondary | ICD-10-CM | POA: Insufficient documentation

## 2019-05-27 DIAGNOSIS — R1032 Left lower quadrant pain: Secondary | ICD-10-CM | POA: Insufficient documentation

## 2019-05-27 HISTORY — DX: Malignant (primary) neoplasm, unspecified: C80.1

## 2019-05-27 MED ORDER — IOHEXOL 300 MG/ML  SOLN
100.0000 mL | Freq: Once | INTRAMUSCULAR | Status: AC | PRN
  Administered 2019-05-27: 11:00:00 100 mL via INTRAVENOUS

## 2019-05-29 ENCOUNTER — Telehealth: Payer: Self-pay | Admitting: *Deleted

## 2019-05-29 NOTE — Telephone Encounter (Signed)
Left message for patient to call us at [phone] re: last visit at [facility] in [facility] for further instructions.

## 2019-05-30 ENCOUNTER — Telehealth: Payer: Self-pay | Admitting: Family Medicine

## 2019-05-30 NOTE — Telephone Encounter (Signed)
Pt called looking for his test results of the covid-19. He stated that he had the test last Tuesday. There are no results noted in his chart at this time. He had the test done at [facility]. Advised that if we get the results we or the provider will let him know the results. Pt voiced understanding.

## 2019-06-02 ENCOUNTER — Ambulatory Visit: Payer: Medicaid Other | Admitting: Neurology

## 2019-06-02 NOTE — Progress Notes (Signed)
NEUROLOGY CONSULTATION NOTE  Ronald Petersen MRN: [id number] DOB: 1956-01-09  Referring provider: Roma Schanz, DO Primary care provider: Roma Schanz, DO  Reason for consult:  Leg weakness  HISTORY OF PRESENT ILLNESS: Ronald Petersen is a 63 year old man who presents for lower extremity weakness.  History supplemented by referring provider notes.  Patient has history of chronic neck and back pain with history of lumbar laminectomy.  He is on multiple medications for chronic pain.  He reports radicular pain down the back of his legs when he walks.  Lumbar spine X-ray from 01/04/19 personally reviewed and demonstrated multilevel arthritic changes with moderate disc space narrowing at L4-5 and L5-S1 as well as facet arthritic changes at L3-4, L4-5 and L5-S1 bilaterally and anterior osteophytes at L4, L5, and S1.  He also reports worsening neck pain as well, radiating down both sides and into his shoulders.  Neck pain is followed by Dr. Lorin Mercy.  Due to chronic neck pain, cervical X-ray from 05/20/19 showed spondylosis most prominent at C5-6 and C6-7.  Over the past year, he reports worsening weakness and pain in the legs.  He also reports weakness in the upper extremities as well.  He has significant numbness in the legs as well as in the hands radiating up the arms.  He was diagnosed with severe carpal tunnel syndrome.  Labs from February include B12 342 and low B1 of 7.  He has a history of alcohol dependence and reports that he was drinking about 6 beers every other day up until 3 months ago.  Hgb A1c from 05/20/19 was elevated at 9.7.  About a month ago, he developed positional vertigo described as spinning sensation lasting seconds with change in head movement.  PAST MEDICAL HISTORY: Past Medical History:  Diagnosis Date  . Anxiety   . Arthritis   . Cancer ([place])   . Complication of anesthesia    aspirated with back surgery at age 71  . Depression   . Diabetes mellitus   . GERD  (gastroesophageal reflux disease)   . Hyperlipidemia   . Hypertension   . Neuromuscular disorder ([place])    NEUROPATHY  . Right rotator cuff tear 11/23/2018  . Sleep apnea    no CPAP    PAST SURGICAL HISTORY: Past Surgical History:  Procedure Laterality Date  . APPENDECTOMY    . ARTHOSCOPIC ROTAOR CUFF REPAIR Right 11/23/2018   Procedure: ARTHROSCOPIC ROTATOR CUFF REPAIR;  Surgeon: [name], MD;  Location: [facility];  Service: Orthopedics;  Laterality: Right;  . CHOLECYSTECTOMY    . LUMBAR LAMINECTOMY    . SHOULDER ARTHROSCOPY WITH ROTATOR CUFF REPAIR AND SUBACROMIAL DECOMPRESSION Right 11/23/2018   Procedure: SHOULDER ARTHROSCOPY WITH ROTATOR CUFF REPAIR AND SUBACROMIAL DECOMPRESSION;  Surgeon: [name], MD;  Location: [facility];  Service: Orthopedics;  Laterality: Right;    MEDICATIONS: Current Outpatient Medications on File Prior to Visit  Medication Sig Dispense Refill  . amoxicillin-clavulanate (AUGMENTIN) 875-125 MG tablet Take 1 tablet by mouth 2 (two) times daily. (Patient not taking: Reported on 05/24/2019) 20 tablet 0  . BAYER CONTOUR TEST test strip Check Blood sugar once daily. Dx E11.9 50 each 12  . BAYER MICROLET LANCETS lancets Check Blood sugar once daily. Dx E11.9 50 each 11  . gabapentin (NEURONTIN) 600 MG tablet Take 1 tablet (600 mg total) by mouth 3 (three) times daily. 90 tablet 5  . glimepiride ([name]) 2 MG tablet Take 1 tablet (2 mg  total) by mouth daily before breakfast. 30 tablet 2  . glucose blood test strip Use as instructed 100 each 12  . lisinopril (PRINIVIL,ZESTRIL) 5 MG tablet TAKE 1 TABLET (5 MG TOTAL) BY MOUTH DAILY. 90 tablet 1  . metFORMIN (GLUCOPHAGE-XR) 500 MG 24 hr tablet TAKE TWO TABLETS BY MOUTH DAILY 180 tablet 1  . omeprazole (PRILOSEC) 40 MG capsule TAKE 1 CAPSULE (40 MG TOTAL) BY MOUTH DAILY. 90 capsule 3  . ondansetron (ZOFRAN) 4 MG tablet Take 1 tablet (4 mg total) by mouth every 8 (eight) hours  as needed. 20 tablet 0  . oxyCODONE-acetaminophen (PERCOCET/ROXICET) 5-325 MG tablet TAKE 1 TABLET BY MOUTH EVERY 8 HOURS AS NEEDED FOR SEVERE PAIN 90 tablet 0  . pravastatin (PRAVACHOL) 40 MG tablet Take 1 tablet (40 mg total) by mouth daily. 90 tablet 3  . sennosides-docusate sodium (SENOKOT-S) 8.6-50 MG tablet Take 2 tablets by mouth daily. 30 tablet 1  . sertraline (ZOLOFT) 100 MG tablet TAKE 1 TABLET (100 MG TOTAL) BY MOUTH DAILY. 180 tablet 0  . tiZANidine (ZANAFLEX) 4 MG tablet TAKE 1 TABLET (4 MG TOTAL) BY MOUTH EVERY 6 (SIX) HOURS AS NEEDED FOR MUSCLE SPASMS. 30 tablet 1   No current facility-administered medications on file prior to visit.     ALLERGIES: Allergies  Allergen Reactions  . Niacin Anaphylaxis    FAMILY HISTORY: Family History  Problem Relation Age of Onset  . Ovarian cancer Sister   . Stomach cancer Sister   . Heart disease Sister        MI  . Heart disease Sister        MI  . Alcohol abuse Unknown   . Depression Unknown   . Arthritis Unknown   . Hypertension Unknown   . Stomach cancer Maternal Grandmother   . COPD Mother   . Stroke Father   . Coronary artery disease Unknown   . Ovarian cancer Unknown        neice  . Ovarian cancer Unknown        neice  . Colon cancer Unknown    SOCIAL HISTORY: Social History   Socioeconomic History  . Marital status: Married    Spouse name: Lorriane Shire  . Number of children: 2  . Years of education: Not on file  . Highest education level: Not on file  Occupational History  . Occupation: self employed    Fish farm manager: UNEMPLOYED  Social Needs  . Financial resource strain: Not on file  . Food insecurity:    Worry: Not on file    Inability: Not on file  . Transportation needs:    Medical: Not on file    Non-medical: Not on file  Tobacco Use  . Smoking status: Former Smoker    Years: 16.00    Types: Cigarettes    Last attempt to quit: 12/29/1985    Years since quitting: 33.4  . Smokeless tobacco: Never Used   Substance and Sexual Activity  . Alcohol use: Yes    Alcohol/week: 4.0 - 5.0 standard drinks    Types: 4 - 5 Cans of beer per week    Comment: 5 times per week  . Drug use: No  . Sexual activity: Yes    Partners: Female  Lifestyle  . Physical activity:    Days per week: Not on file    Minutes per session: Not on file  . Stress: Not on file  Relationships  . Social connections:    Talks on phone: Not  on file    Gets together: Not on file    Attends religious service: Not on file    Active member of club or organization: Not on file    Attends meetings of clubs or organizations: Not on file    Relationship status: Not on file  . Intimate partner violence:    Fear of current or ex partner: Not on file    Emotionally abused: Not on file    Physically abused: Not on file    Forced sexual activity: Not on file  Other Topics Concern  . Not on file  Social History Narrative   Exercise-- 3 days   Pt has hs degree    REVIEW OF SYSTEMS: Constitutional: No fevers, chills, or sweats, no generalized fatigue, change in appetite Eyes: No visual changes, double vision, eye pain Ear, nose and throat: No hearing loss, ear pain, nasal congestion, sore throat Cardiovascular: No chest pain, palpitations Respiratory:  No shortness of breath at rest or with exertion, wheezes GastrointestinaI: No nausea, vomiting, diarrhea, abdominal pain, fecal incontinence Genitourinary:  No dysuria, urinary retention or frequency Musculoskeletal:  No neck pain, back pain Integumentary: No rash, pruritus, skin lesions Neurological: as above Psychiatric: No depression, insomnia, anxiety Endocrine: No palpitations, fatigue, diaphoresis, mood swings, change in appetite, change in weight, increased thirst Hematologic/Lymphatic:  No purpura, petechiae. Allergic/Immunologic: no itchy/runny eyes, nasal congestion, recent allergic reactions, rashes  PHYSICAL EXAM: Vitals:   06/03/19 1259  BP: 104/68    General: No acute distress.  Patient appears well-groomed.  Head:  Normocephalic/atraumatic Eyes:  fundi examined but not visualized Neck: supple, no paraspinal tenderness, full range of motion Back: No paraspinal tenderness Heart: regular rate and rhythm Lungs: Clear to auscultation bilaterally. Vascular: No carotid bruits. Neurological Exam: Mental status: alert and oriented to person, place, and time, recent and remote memory intact, fund of knowledge intact, attention and concentration intact, speech fluent and not dysarthric, language intact. Cranial nerves: CN I: not tested CN II: pupils equal, round and reactive to light, visual Lazar intact CN III, IV, VI:  full range of motion, no nystagmus, no ptosis CN V: facial sensation intact CN VII: upper and lower face symmetric CN VIII: hearing intact CN IX, X: gag intact, uvula midline CN XI: sternocleidomastoid and trapezius muscles intact CN XII: tongue midline Bulk & Tone: normal, no fasciculations. Motor:  Mild decreased effort but 5/5 throughout when encouraged to provide full effort Sensation:  Pinprick sensation reduced in upper extremities up to below shoulders and feet up to just below knees and vibration sensation reduced in lower extremities up to knees. Deep Tendon Reflexes:  1+ throughout except absent in ankles, toes downgoing. Finger to nose testing:  Without dysmetria.  Heel to shin:  Without dysmetria.  Gait:  Mildly wide-based gait..  Able to turn and tandem walk. Romberg with very mild sway.  IMPRESSION: 1.  Unsteady gait with subjective leg weakness.  Multifactorial.  He may have lumbar spinal stenosis but also diffuse joint pain, significant polyneuropathy related to uncontrolled diabetes and possibly alcoholism/thiamine deficiency.  He has known cervical spine disease and numbness in the arms, therefore one might consider cervical spinal stenosis as cause for weakness and unsteady gait.  However, that can be  explained by his severe carpal tunnel syndrome and he does not exhibit any signs or symptoms of a myelopathy (increased tone, hyperreflexia, Hoffman or Babinski sign).  Gait likely affected by polypharmacy as well. 2.  Dizziness semiology consistent with benign paroxysmal positional  vertigo.  I do not suspect any central brain etiology based on non-focal exam.  PLAN: We will check MRI lumbar spine to evaluate for any significant spinal stenosis.  We can refer him to surgery if spinal stenosis is noted.  However, I did tell him that lumbar stenosis is likely not the sole cause of his gait abnormality and leg weakness. As for dizziness, if continues then PCP may want to consider vestibular rehab.  He should optimize glycemic control and take thiamine supplement.   Thank you for allowing me to take part in the care of this patient.  Metta Clines, DO  CC: Roma Schanz, DO

## 2019-06-03 ENCOUNTER — Other Ambulatory Visit: Payer: Self-pay

## 2019-06-03 ENCOUNTER — Encounter: Payer: Self-pay | Admitting: Neurology

## 2019-06-03 ENCOUNTER — Ambulatory Visit (INDEPENDENT_AMBULATORY_CARE_PROVIDER_SITE_OTHER): Payer: Self-pay | Admitting: Neurology

## 2019-06-03 VITALS — BP 104/68 | Ht 71.0 in | Wt 200.0 lb

## 2019-06-03 DIAGNOSIS — E1165 Type 2 diabetes mellitus with hyperglycemia: Secondary | ICD-10-CM

## 2019-06-03 DIAGNOSIS — H8110 Benign paroxysmal vertigo, unspecified ear: Secondary | ICD-10-CM

## 2019-06-03 DIAGNOSIS — E1151 Type 2 diabetes mellitus with diabetic peripheral angiopathy without gangrene: Secondary | ICD-10-CM

## 2019-06-03 DIAGNOSIS — M5441 Lumbago with sciatica, right side: Secondary | ICD-10-CM

## 2019-06-03 DIAGNOSIS — G6289 Other specified polyneuropathies: Secondary | ICD-10-CM

## 2019-06-03 DIAGNOSIS — R29898 Other symptoms and signs involving the musculoskeletal system: Secondary | ICD-10-CM

## 2019-06-03 DIAGNOSIS — IMO0002 Reserved for concepts with insufficient information to code with codable children: Secondary | ICD-10-CM

## 2019-06-03 DIAGNOSIS — M25562 Pain in left knee: Secondary | ICD-10-CM

## 2019-06-03 DIAGNOSIS — E519 Thiamine deficiency, unspecified: Secondary | ICD-10-CM

## 2019-06-03 DIAGNOSIS — M5442 Lumbago with sciatica, left side: Secondary | ICD-10-CM

## 2019-06-03 DIAGNOSIS — M25561 Pain in right knee: Secondary | ICD-10-CM

## 2019-06-03 NOTE — Telephone Encounter (Signed)
Patient stated that he did get results from [facility] and they were negative.

## 2019-06-03 NOTE — Patient Instructions (Addendum)
1.  We will get MRI of lumbar spine 2.  Further recommendations pending results.  We have sent a referral to [facility] for your MRI and you can call them directly to schedule your appointment. If you need to contact them directly please call [phone].

## 2019-06-10 ENCOUNTER — Other Ambulatory Visit: Payer: Self-pay | Admitting: Family Medicine

## 2019-06-10 DIAGNOSIS — G8929 Other chronic pain: Secondary | ICD-10-CM

## 2019-06-10 MED FILL — GABAPENTIN 600 MG TABS: 600 | 30 days supply | Qty: 90 | Fill #0

## 2019-06-10 MED FILL — metFORMIN HCL ER 500 MG TB2: 500 | 90 days supply | Qty: 180 | Fill #1

## 2019-06-11 ENCOUNTER — Ambulatory Visit (HOSPITAL_COMMUNITY)
Admission: RE | Admit: 2019-06-11 | Discharge: 2019-06-11 | Disposition: A | Discharge status: Home / Self Care | Payer: Self-pay | Source: Ambulatory Visit | Attending: Neurology | Admitting: Neurology

## 2019-06-11 ENCOUNTER — Other Ambulatory Visit: Payer: Self-pay

## 2019-06-11 DIAGNOSIS — M5442 Lumbago with sciatica, left side: Secondary | ICD-10-CM | POA: Insufficient documentation

## 2019-06-11 DIAGNOSIS — R29898 Other symptoms and signs involving the musculoskeletal system: Secondary | ICD-10-CM | POA: Insufficient documentation

## 2019-06-11 DIAGNOSIS — M5441 Lumbago with sciatica, right side: Secondary | ICD-10-CM | POA: Insufficient documentation

## 2019-06-13 ENCOUNTER — Telehealth: Payer: Self-pay

## 2019-06-13 MED FILL — OXYCODONE-ACETAMINOPHEN 5-3: 5-325 | 30 days supply | Qty: 90 | Fill #0

## 2019-06-13 NOTE — Telephone Encounter (Signed)
Called Pt, LMOVM for return call tomorrow after 8am

## 2019-06-13 NOTE — Telephone Encounter (Signed)
-----   Message from Pieter Partridge, DO sent at 06/13/2019 12:38 PM EDT ----- MRI shows degenerative disc disease in the spine causing some nerve irritation but nothing significant.  Incidentally, his left kidney is partially seen, which demonstrates a probable cyst.  Likely of no clinical importance but if he should have further questions, I direct him to discuss with his PCP.  No further recommendations at this time.

## 2019-06-13 NOTE — Telephone Encounter (Signed)
Requesting: Percocet Contract: 08/03/2018 UDS: 08/03/2018, low risk, next screen 02/03/2019 Last OV: 05/24/2019 Next OV: 08/25/2019 Last Refill: 04/12/2019, #90--0 RF Database:   Please advise

## 2019-06-14 ENCOUNTER — Telehealth: Payer: Self-pay

## 2019-06-14 NOTE — Telephone Encounter (Signed)
-----   Message from Pieter Partridge, DO sent at 06/13/2019 12:38 PM EDT ----- MRI shows degenerative disc disease in the spine causing some nerve irritation but nothing significant.  Incidentally, his left kidney is partially seen, which demonstrates a probable cyst.  Likely of no clinical importance but if he should have further questions, I direct him to discuss with his PCP.  No further recommendations at this time.

## 2019-06-14 NOTE — Telephone Encounter (Signed)
Called and advised Pt of MRI results. He states his sister recently died with kidney cancer, and has had 2 cousins and an uncle also die of kidney cancer. We will route to his PCP.

## 2019-06-16 ENCOUNTER — Ambulatory Visit (INDEPENDENT_AMBULATORY_CARE_PROVIDER_SITE_OTHER): Payer: Medicaid Other | Admitting: Family Medicine

## 2019-06-16 ENCOUNTER — Encounter: Payer: Self-pay | Admitting: Family Medicine

## 2019-06-16 DIAGNOSIS — N281 Cyst of kidney, acquired: Secondary | ICD-10-CM

## 2019-06-16 NOTE — Progress Notes (Signed)
Virtual Visit via Video Note  I connected with Ronald Petersen on 06/16/19 at  3:30 PM EDT by a video enabled telemedicine application and verified that I am speaking with the correct person using two identifiers.  Location: Patient: home  Provider: home    I discussed the limitations of evaluation and management by telemedicine and the availability of in person appointments. The patient expressed understanding and agreed to proceed.  History of Present Illness: Pt is home c/o possible cyst on kidney that was seen on mri LS spine No other new complaints   Observations/Objective: Virtual Visit via Video Note  I connected with Ronald Petersen on 06/16/19 at  3:30 PM EDT by a video enabled telemedicine application and verified that I am speaking with the correct person using two identifiers.  Location: Patient: home  Provider: home    I discussed the limitations of evaluation and management by telemedicine and the availability of in person appointments. The patient expressed understanding and agreed to proceed.  History of Present Illness: Pt had mri LS spine and was told he had a cyst on his kidney   Observations/Objective: No vitals obtained  Pt is in NAD  Assessment and Plan: 1. Renal cyst Check [place]  - US Renal; Future   Follow Up Instructions:    I discussed the assessment and treatment plan with the patient. The patient was provided an opportunity to ask questions and all were answered. The patient agreed with the plan and demonstrated an understanding of the instructions.   The patient was advised to call back or seek an in-person evaluation if the symptoms worsen or if the condition fails to improve as anticipated.  I provided 15 minutes of non-face-to-face time during this encounter.   [name], DO    Assessment and Plan: 1. Renal cyst Not seen well on MRi-----check US kidney  - US Renal; Future   Follow Up Instructions:    I discussed  the assessment and treatment plan with the patient. The patient was provided an opportunity to ask questions and all were answered. The patient agreed with the plan and demonstrated an understanding of the instructions.   The patient was advised to call back or seek an in-person evaluation if the symptoms worsen or if the condition fails to improve as anticipated.  I provided 15 minutes of non-face-to-face time during this encounter.   [name], DO

## 2019-06-21 ENCOUNTER — Ambulatory Visit (INDEPENDENT_AMBULATORY_CARE_PROVIDER_SITE_OTHER): Payer: Medicaid Other | Admitting: Orthopaedic Surgery

## 2019-06-21 ENCOUNTER — Encounter: Payer: Self-pay | Admitting: Orthopaedic Surgery

## 2019-06-21 ENCOUNTER — Other Ambulatory Visit: Payer: Self-pay

## 2019-06-21 VITALS — Ht 72.0 in | Wt 200.0 lb

## 2019-06-21 DIAGNOSIS — M4722 Other spondylosis with radiculopathy, cervical region: Secondary | ICD-10-CM

## 2019-06-21 DIAGNOSIS — M542 Cervicalgia: Secondary | ICD-10-CM

## 2019-06-21 DIAGNOSIS — G5603 Carpal tunnel syndrome, bilateral upper limbs: Secondary | ICD-10-CM

## 2019-06-21 NOTE — Progress Notes (Signed)
Office Visit Note   Patient: Ronald Petersen           Date of Birth: 1956/08/06           MRN: [id number] Visit Date: 06/21/2019              Requested by: [id number] [place], [place] [address] [place],  [place] [address] PCP: [name], Alferd Apa, DO   Assessment & Plan: Visit Diagnoses:  1. Neck pain   2. Other spondylosis with radiculopathy, cervical region   3. Carpal tunnel syndrome, bilateral     Plan: We will proceed with cervical MRI scan for evaluation of his bilateral upper extremity symptoms.  He Artie had known fairly significant cervical disease several years ago and likely has had some progression.  I discussed with him that I recommend avoiding narcotic medication so that if he does require surgery to work better afterwards.  We discussed problems with chronic narcotic use including tolerance, addiction, withdrawal, types of pain that narcotics do not help.  Check him back again after the MRI scan cervical spine.   Follow-Up Instructions: No follow-ups on file.   Orders:  Orders Placed This Encounter  Procedures  . MR Cervical Spine w/o contrast   No orders of the defined types were placed in this encounter.     Procedures: No procedures performed   Clinical Data: No additional findings.   Subjective: Chief Complaint  Patient presents with  . Neck - Pain, Follow-up  . Lower Back - Pain, Follow-up     HPI 63 year old male seen for follow-up with problems with bilateral severe carpal tunnel syndrome and also severe neck pain.  Patient states he did not getting instructions on home exercises at last visit.  He states there is no change in his symptoms.  He been prescribed by  PCP oxycodone  90 tablets of 5/325, gabapentin and ibuprofen.  Review of Systems 14 point review of systems updated unchanged from 05/20/2019.  Previous lumbar surgery age 20 for ruptured disc.  Chronic neck pain for years.  Chronic carpal tunnel syndrome bilateral.   Previous cervical MRI scan showed some spinal stenosis and potential left-sided neural impingement C3-4, C5-6 and C6-7 with central and left-sided protrusion.  Some cord flattening at C5-6.  Scan was 2016.   Objective: Vital Signs: Ht 6' (1.829 m)   Wt 200 lb (90.7 kg)   BMI 27.12 kg/m   Physical Exam Constitutional:      Appearance: He is well-developed.  HENT:     Head: Normocephalic and atraumatic.  Eyes:     Pupils: Pupils are equal, round, and reactive to light.  Neck:     Thyroid: No thyromegaly.     Trachea: No tracheal deviation.  Cardiovascular:     Rate and Rhythm: Normal rate.  Pulmonary:     Effort: Pulmonary effort is normal.     Breath sounds: No wheezing.  Abdominal:     General: Bowel sounds are normal.     Palpations: Abdomen is soft.  Skin:    General: Skin is warm and dry.     Capillary Refill: Capillary refill takes less than 2 seconds.  Neurological:     Mental Status: He is alert and oriented to person, place, and time.  Psychiatric:        Behavior: Behavior normal.        Thought Content: Thought content normal.        Judgment:  Judgment normal.     Ortho Exam patient has brachial plexus tenderness.  Negative logroll.  Plantar surface of his feet are good.  Distal pulses are palpable.  Upper extremity reflexes are 2+ and symmetrical.  Patient has mild bilateral thenar atrophy.  Positive Phalen's right and left.  Specialty Comments:  No specialty comments available.  Imaging: No results found.   PMFS History: Patient Active Problem List   Diagnosis Date Noted  . Aortic atherosclerosis ([place]) 05/27/2019  . Chronic bilateral low back pain without sciatica 05/20/2019  . Carpal tunnel syndrome, bilateral 05/20/2019  . Other spondylosis with radiculopathy, cervical region 05/20/2019  . Leg weakness, bilateral 01/05/2019  . Falling episodes 01/05/2019  . Right rotator cuff tear 11/23/2018  . Neck pain 11/04/2018  . Acute pain of right  shoulder 11/04/2018  . Gastroesophageal reflux disease 11/04/2018  . Hyperlipidemia associated with type 2 diabetes mellitus ([place]) 11/04/2018  . 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 Essential hypertension 09/14/2017  . Preventative health care 06/12/2016  . OSA (obstructive sleep apnea) 04/07/2016  . Joint pain 04/05/2016  . NSAID long-term use 12/13/2015  . Nausea without vomiting 12/13/2015  . History of colonic polyps 12/13/2015  . Loss of weight 12/13/2015  . Depression 11/06/2015  . Metatarsal deformity 02/20/2015  . Equinus deformity of foot, acquired 02/20/2015  . Plantar fasciitis, bilateral 02/15/2015  . Left wrist injury 06/15/2014  . Peripheral neuropathy 04/01/2012  . FATIGUE 11/05/2010  . OTHER SPECIFIED DISORDER OF PENIS 03/26/2010  . NAUSEA 03/26/2010  . Pain in limb 10/17/2008  . NECK PAIN, CHRONIC 11/24/2007  . DM (diabetes mellitus) type II uncontrolled, periph vascular disorder ([place]) 01/27/2007  . Hyperlipidemia LDL goal <70 01/27/2007   Past Medical History:  Diagnosis Date  . Anxiety   . Arthritis   . Cancer ([place])   . Complication of anesthesia    aspirated with back surgery at age 68  . Depression   . Diabetes mellitus   . GERD (gastroesophageal reflux disease)   . Hyperlipidemia   . Hypertension   . Neuromuscular disorder ([place])    NEUROPATHY  . Right rotator cuff tear 11/23/2018  . Sleep apnea    no CPAP    Family History  Problem Relation Age of Onset  . Ovarian cancer Sister   . Stomach cancer Sister   . Heart disease Sister        MI  . Heart disease Sister        MI  . Alcohol abuse Other   . Depression Other   . Arthritis Other   . Hypertension Other   . Stomach cancer Maternal Grandmother   . COPD Mother   . Stroke Father   . Coronary artery disease Other   . Ovarian cancer Other        neice  . Ovarian cancer Other        neice  . Colon cancer Other     Past Surgical History:   Procedure Laterality Date  . APPENDECTOMY    . ARTHOSCOPIC ROTAOR CUFF REPAIR Right 11/23/2018   Procedure: ARTHROSCOPIC ROTATOR CUFF REPAIR;  Surgeon: [name], MD;  Location: [facility];  Service: Orthopedics;  Laterality: Right;  . CHOLECYSTECTOMY    . LUMBAR LAMINECTOMY    . SHOULDER ARTHROSCOPY WITH ROTATOR CUFF REPAIR AND SUBACROMIAL DECOMPRESSION Right 11/23/2018   Procedure: SHOULDER ARTHROSCOPY WITH ROTATOR CUFF REPAIR AND SUBACROMIAL DECOMPRESSION;  Surgeon: Mardelle Matte,  Vonna Kotyk, MD;  Location: [facility];  Service: Orthopedics;  Laterality: Right;   Social History   Occupational History  . Occupation: self employed    Fish farm manager: UNEMPLOYED  Tobacco Use  . Smoking status: Former Smoker    Years: 16.00    Types: Cigarettes    Quit date: 12/29/1985    Years since quitting: 33.4  . Smokeless tobacco: Never Used  Substance and Sexual Activity  . Alcohol use: Yes    Alcohol/week: 4.0 - 5.0 standard drinks    Types: 4 - 5 Cans of beer per week    Comment: 5 times per week  . Drug use: No  . Sexual activity: Yes    Partners: Female

## 2019-06-22 ENCOUNTER — Ambulatory Visit (HOSPITAL_BASED_OUTPATIENT_CLINIC_OR_DEPARTMENT_OTHER)
Admission: RE | Admit: 2019-06-22 | Discharge: 2019-06-22 | Disposition: A | Discharge status: Home / Self Care | Payer: Self-pay | Source: Ambulatory Visit | Attending: Family Medicine | Admitting: Family Medicine

## 2019-06-22 DIAGNOSIS — N281 Cyst of kidney, acquired: Secondary | ICD-10-CM | POA: Insufficient documentation

## 2019-06-23 MED FILL — GLIMEPIRIDE 2 MG TABLET: 2 | 30 days supply | Qty: 30 | Fill #1

## 2019-07-11 ENCOUNTER — Other Ambulatory Visit: Payer: Self-pay | Admitting: Family Medicine

## 2019-07-11 DIAGNOSIS — I10 Essential (primary) hypertension: Secondary | ICD-10-CM

## 2019-07-11 MED FILL — GABAPENTIN 600 MG TABS: 600 | 30 days supply | Qty: 90 | Fill #1

## 2019-07-13 MED FILL — LISINOPRIL 5 MG TABLET: 5 | 90 days supply | Qty: 90 | Fill #0

## 2019-07-21 ENCOUNTER — Other Ambulatory Visit: Payer: Self-pay | Admitting: Family Medicine

## 2019-07-21 DIAGNOSIS — G8929 Other chronic pain: Secondary | ICD-10-CM

## 2019-07-21 MED FILL — OXYCODONE-ACETAMINOPHEN 5-3: 5-325 | 30 days supply | Qty: 90 | Fill #0

## 2019-07-21 NOTE — Telephone Encounter (Signed)
Requesting:Percocet  Contract: 08/03/2018 UDS: 08/03/2018, low risk, next screen 0206/2020 Last OV: 06/16/2019 Next OV: 08/25/2019 Last Refill: 06/13/2019, #90--0 RF Database:   Please advise

## 2019-07-23 ENCOUNTER — Other Ambulatory Visit: Payer: Self-pay

## 2019-07-23 ENCOUNTER — Ambulatory Visit (HOSPITAL_COMMUNITY)
Admission: RE | Admit: 2019-07-23 | Discharge: 2019-07-23 | Disposition: A | Discharge status: Home / Self Care | Payer: Self-pay | Source: Ambulatory Visit | Attending: Orthopaedic Surgery | Admitting: Orthopaedic Surgery

## 2019-07-23 DIAGNOSIS — M542 Cervicalgia: Secondary | ICD-10-CM | POA: Insufficient documentation

## 2019-07-26 ENCOUNTER — Ambulatory Visit: Payer: Medicaid Other | Admitting: Orthopaedic Surgery

## 2019-07-26 ENCOUNTER — Encounter: Payer: Self-pay | Admitting: Orthopaedic Surgery

## 2019-07-26 ENCOUNTER — Ambulatory Visit (INDEPENDENT_AMBULATORY_CARE_PROVIDER_SITE_OTHER): Payer: Self-pay | Admitting: Orthopaedic Surgery

## 2019-07-26 ENCOUNTER — Other Ambulatory Visit: Payer: Self-pay | Admitting: *Deleted

## 2019-07-26 VITALS — Ht 72.0 in | Wt 200.0 lb

## 2019-07-26 DIAGNOSIS — F32A Depression, unspecified: Secondary | ICD-10-CM

## 2019-07-26 DIAGNOSIS — F329 Major depressive disorder, single episode, unspecified: Secondary | ICD-10-CM

## 2019-07-26 DIAGNOSIS — M4802 Spinal stenosis, cervical region: Secondary | ICD-10-CM

## 2019-07-26 DIAGNOSIS — M4722 Other spondylosis with radiculopathy, cervical region: Secondary | ICD-10-CM

## 2019-07-26 MED ORDER — SERTRALINE HCL 100 MG PO TABS: 100.0000 mg | ORAL_TABLET | Freq: Every day | ORAL | 1 refills | Status: DC

## 2019-07-26 MED FILL — GLIMEPIRIDE 2 MG TABLET: 2 | 30 days supply | Qty: 30 | Fill #2

## 2019-07-26 MED FILL — SERTRALINE HCL 100 MG TAB: 100 | 90 days supply | Qty: 90 | Fill #0

## 2019-07-26 NOTE — Progress Notes (Signed)
Office Visit Note   Patient: Ronald Petersen           Date of Birth: Aug 03, 1956           MRN: [id number] Visit Date: 07/26/2019              Requested by: [address] [place], [place] [address] [place],  [place] [address] PCP: [name], Alferd Apa, DO   Assessment & Plan: Visit Diagnoses:  1. Other spondylosis with radiculopathy, cervical region   2. Foraminal stenosis of cervical region     Plan: C5-6 anterior cervical discectomy and fusion with allograft and plate.  He has severe foraminal stenosis with radicular symptoms and states his pain is severe it bothers him at night and he is failed conservative treatment.  He will need preoperative medical clearance with his PCP.  Procedure discussed risks of surgery discussed use of soft collar postoperatively.  Risk of dysphasia, dysphonia, pseudoarthrosis, possible need for posterior surgery with fusion if the anterior procedure was not successful.  Questions were elicited and answered.  Follow-Up Instructions: Pre-op for single level C5-6 ACDF.  Orders:  No orders of the defined types were placed in this encounter.  No orders of the defined types were placed in this encounter.     Procedures: No procedures performed   Clinical Data: No additional findings.   Subjective: Chief Complaint  Patient presents with  . Neck - Pain, Follow-up    MRI Cervical Spine Review    HPI 63 year old male returns with persistent problems with cervical spondylosis and severe foraminal stenosis on the left at C5-6 with radiculopathy.  Patient is a diabetic and had his medicines recently changed with improvement in his A1c.  Previously his sugars are running 200-300 and now he states is in the low 100s.  He has been on gabapentin, ibuprofen and oxycodone due to his pain.  He has had chronic neck pain for years and is been significantly worse in the last few months.  Cord flattening with spondylosis C5-6 and severe left  foraminal stenosis by repeat MRI scan comparison to 2016 images.  Patient denies lower extremity symptoms related to spondylosis.  No fever or chills.  Review of Systems last A1c 05/20/2019 elevated at 9.7.  He states his diabetic control is improved with his new medication.  Possible history of right rotator cuff tear.  Hyperlipidemia.  Peripheral neuropathy, depression, fatigue, sleep apnea.  Positive for gastroesophageal reflux disease.  Otherwise negative is obtains HPI.   Objective: Vital Signs: Ht 6' (1.829 m)   Wt 200 lb (90.7 kg)   BMI 27.12 kg/m   Physical Exam Constitutional:      Appearance: He is well-developed.  HENT:     Head: Normocephalic and atraumatic.  Eyes:     Pupils: Pupils are equal, round, and reactive to light.  Neck:     Thyroid: No thyromegaly.     Trachea: No tracheal deviation.  Cardiovascular:     Rate and Rhythm: Normal rate.  Pulmonary:     Effort: Pulmonary effort is normal.     Breath sounds: No wheezing.  Abdominal:     General: Bowel sounds are normal.     Palpations: Abdomen is soft.  Skin:    General: Skin is warm and dry.     Capillary Refill: Capillary refill takes less than 2 seconds.  Neurological:     Mental Status: He is alert and oriented to person, place, and  time.  Psychiatric:        Behavior: Behavior normal.        Thought Content: Thought content normal.        Judgment: Judgment normal.     Ortho Exam positive Spurling and brachial plexus tenderness on the left only.  Negative Lhermitte, biceps triceps brachial radialis reflex are 2+ and symmetrical.  Patient has positive Phalen's test both right and left.  Normal heel toe gait no lower extremity hyperreflexia no clonus.  Negative Babinski.  Specialty Comments:  No specialty comments available.  Imaging: CLINICAL DATA:  Neck pain.  Right-greater-than-left symptoms.  EXAM: MRI CERVICAL SPINE WITHOUT CONTRAST  TECHNIQUE: Multiplanar, multisequence MR imaging of  the cervical spine was performed. No intravenous contrast was administered.  COMPARISON:  Cervical spine MRI 11/10/2015  FINDINGS: Alignment: Physiologic.  Vertebrae: No fracture, evidence of discitis, or bone lesion.  Cord: Normal signal and morphology.  Posterior Fossa, vertebral arteries, paraspinal tissues: Visualized posterior fossa is normal. Vertebral artery flow voids are preserved. No prevertebral soft tissue swelling.  Disc levels:  C2-3: Normal.  C3-4: Left subarticular disc protrusion, unchanged. No spinal canal or neural foraminal stenosis.  C4-5: Normal.  C5-6: Unchanged intermediate sized left subarticular disc osteophyte complex. Severe left neural foraminal stenosis. No spinal canal or right neural foraminal stenosis.  C6-7: Small disc bulge without spinal canal or neural foraminal stenosis.  C7-T1: No spinal canal or neural foraminal stenosis.  IMPRESSION: Unchanged C5-6 left subarticular disc osteophyte complex with severe left neural foraminal stenosis.   Electronically Signed   By: Ulyses Jarred M.D.   On: 07/24/2019 00:47   PMFS History: Patient Active Problem List   Diagnosis Date Noted  . Foraminal stenosis of cervical region 07/26/2019  . Aortic atherosclerosis ([place]) 05/27/2019  . Chronic bilateral low back pain without sciatica 05/20/2019  . Carpal tunnel syndrome, bilateral 05/20/2019  . Other spondylosis with radiculopathy, cervical region 05/20/2019  . Leg weakness, bilateral 01/05/2019  . Falling episodes 01/05/2019  . Right rotator cuff tear 11/23/2018  . Neck pain 11/04/2018  . Acute pain of right shoulder 11/04/2018  . Gastroesophageal reflux disease 11/04/2018  . Hyperlipidemia associated with type 2 diabetes mellitus ([place]) 11/04/2018  . 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 Essential hypertension 09/14/2017  . Preventative health care 06/12/2016  . OSA  (obstructive sleep apnea) 04/07/2016  . Joint pain 04/05/2016  . NSAID long-term use 12/13/2015  . Nausea without vomiting 12/13/2015  . History of colonic polyps 12/13/2015  . Loss of weight 12/13/2015  . Depression 11/06/2015  . Metatarsal deformity 02/20/2015  . Equinus deformity of foot, acquired 02/20/2015  . Plantar fasciitis, bilateral 02/15/2015  . Left wrist injury 06/15/2014  . Peripheral neuropathy 04/01/2012  . FATIGUE 11/05/2010  . OTHER SPECIFIED DISORDER OF PENIS 03/26/2010  . NAUSEA 03/26/2010  . Pain in limb 10/17/2008  . NECK PAIN, CHRONIC 11/24/2007  . DM (diabetes mellitus) type II uncontrolled, periph vascular disorder ([place]) 01/27/2007  . Hyperlipidemia LDL goal <70 01/27/2007   Past Medical History:  Diagnosis Date  . Anxiety   . Arthritis   . Cancer ([place])   . Complication of anesthesia    aspirated with back surgery at age 58  . Depression   . Diabetes mellitus   . GERD (gastroesophageal reflux disease)   . Hyperlipidemia   . Hypertension   . Neuromuscular disorder ([place])    NEUROPATHY  . Right rotator cuff tear  11/23/2018  . Sleep apnea    no CPAP    Family History  Problem Relation Age of Onset  . Ovarian cancer Sister   . Stomach cancer Sister   . Heart disease Sister        MI  . Heart disease Sister        MI  . Alcohol abuse Other   . Depression Other   . Arthritis Other   . Hypertension Other   . Stomach cancer Maternal Grandmother   . COPD Mother   . Stroke Father   . Coronary artery disease Other   . Ovarian cancer Other        neice  . Ovarian cancer Other        neice  . Colon cancer Other     Past Surgical History:  Procedure Laterality Date  . APPENDECTOMY    . ARTHOSCOPIC ROTAOR CUFF REPAIR Right 11/23/2018   Procedure: ARTHROSCOPIC ROTATOR CUFF REPAIR;  Surgeon: [name], MD;  Location: [facility];  Service: Orthopedics;  Laterality: Right;  . CHOLECYSTECTOMY    . LUMBAR LAMINECTOMY    .  SHOULDER ARTHROSCOPY WITH ROTATOR CUFF REPAIR AND SUBACROMIAL DECOMPRESSION Right 11/23/2018   Procedure: SHOULDER ARTHROSCOPY WITH ROTATOR CUFF REPAIR AND SUBACROMIAL DECOMPRESSION;  Surgeon: [name], MD;  Location: [facility];  Service: Orthopedics;  Laterality: Right;   Social History   Occupational History  . Occupation: self employed    Fish farm manager: UNEMPLOYED  Tobacco Use  . Smoking status: Former Smoker    Years: 16.00    Types: Cigarettes    Quit date: 12/29/1985    Years since quitting: 33.5  . Smokeless tobacco: Never Used  Substance and Sexual Activity  . Alcohol use: Yes    Alcohol/week: 4.0 - 5.0 standard drinks    Types: 4 - 5 Cans of beer per week    Comment: 5 times per week  . Drug use: No  . Sexual activity: Yes    Partners: Female

## 2019-07-31 ENCOUNTER — Encounter: Payer: Self-pay | Admitting: Orthopaedic Surgery

## 2019-08-15 MED FILL — GABAPENTIN 600 MG TABS: 600 | 30 days supply | Qty: 90 | Fill #2

## 2019-08-24 ENCOUNTER — Other Ambulatory Visit: Payer: Self-pay | Admitting: Family Medicine

## 2019-08-24 DIAGNOSIS — E1165 Type 2 diabetes mellitus with hyperglycemia: Secondary | ICD-10-CM

## 2019-08-24 DIAGNOSIS — G8929 Other chronic pain: Secondary | ICD-10-CM

## 2019-08-24 NOTE — Telephone Encounter (Signed)
Requesting: Percocet Contract: 08/03/2018 UDS: 08/03/2018 Last OV: 06/16/2019 Next OV: 08/25/2019 Last Refill: 07/21/2019, #90--0 RF Database:   Please advise

## 2019-08-25 ENCOUNTER — Other Ambulatory Visit: Payer: Self-pay

## 2019-08-25 ENCOUNTER — Ambulatory Visit (INDEPENDENT_AMBULATORY_CARE_PROVIDER_SITE_OTHER): Payer: Self-pay | Admitting: Family Medicine

## 2019-08-25 ENCOUNTER — Encounter: Payer: Self-pay | Admitting: Family Medicine

## 2019-08-25 DIAGNOSIS — E1169 Type 2 diabetes mellitus with other specified complication: Secondary | ICD-10-CM

## 2019-08-25 DIAGNOSIS — I10 Essential (primary) hypertension: Secondary | ICD-10-CM

## 2019-08-25 DIAGNOSIS — IMO0002 Reserved for concepts with insufficient information to code with codable children: Secondary | ICD-10-CM

## 2019-08-25 DIAGNOSIS — E1151 Type 2 diabetes mellitus with diabetic peripheral angiopathy without gangrene: Secondary | ICD-10-CM

## 2019-08-25 DIAGNOSIS — E1165 Type 2 diabetes mellitus with hyperglycemia: Secondary | ICD-10-CM

## 2019-08-25 DIAGNOSIS — E785 Hyperlipidemia, unspecified: Secondary | ICD-10-CM

## 2019-08-25 MED FILL — OXYCODONE-ACETAMINOPHEN 5-3: 5-325 | 30 days supply | Qty: 90 | Fill #0

## 2019-08-25 MED FILL — GLIMEPIRIDE 2 MG TABLET: 2 | 30 days supply | Qty: 30 | Fill #0

## 2019-08-25 NOTE — Assessment & Plan Note (Signed)
Tolerating statin, encouraged heart healthy diet, avoid trans fats, minimize simple carbs and saturated fats. Increase exercise as tolerated 

## 2019-08-25 NOTE — Assessment & Plan Note (Signed)
hgba1c to be checked, minimize simple carbs. Increase exercise as tolerated. Continue current meds  

## 2019-08-25 NOTE — Assessment & Plan Note (Signed)
Well controlled, no changes to meds. Encouraged heart healthy diet such as the DASH diet and exercise as tolerated.  °

## 2019-08-25 NOTE — Progress Notes (Signed)
Virtual Visit via Video Note  I connected with Willow Dahle Simms on 08/25/19 at 11:20 AM EDT by a video enabled telemedicine application and verified that I am speaking with the correct person using two identifiers.  Location: Patient: home Provider: office   I discussed the limitations of evaluation and management by telemedicine and the availability of in person appointments. The patient expressed understanding and agreed to proceed.  History of Present Illness: Pt is home and needs f/u and refill on pain meds  HYPERTENSION   Blood pressure range-good per pt   Chest pain- no      Dyspnea- no Lightheadedness- no   Edema- no  Other side effects - no   Medication compliance: good Low salt diet- yes    DIABETES    Blood Sugar ranges-good  Polyuria- no New Visual problems- no  Hypoglycemic symptoms- yes-- yesterday Other side effects-no Medication compliance - good   HYPERLIPIDEMIA  Medication compliance- good RUQ pain- no  Muscle aches- no Other side effects-no   \    Observations/Objective: 124/77  Afebrile   bs 77 Pt is NAD  Assessment and Plan:   Follow Up Instructions:    I discussed the assessment and treatment plan with the patient. The patient was provided an opportunity to ask questions and all were answered. The patient agreed with the plan and demonstrated an understanding of the instructions.   The patient was advised to call back or seek an in-person evaluation if the symptoms worsen or if the condition fails to improve as anticipated.  I provided 25 minutes of non-face-to-face time during this encounter.   [name], DO

## 2019-09-26 ENCOUNTER — Other Ambulatory Visit: Payer: Self-pay | Admitting: Family Medicine

## 2019-09-26 DIAGNOSIS — G8929 Other chronic pain: Secondary | ICD-10-CM

## 2019-09-26 MED FILL — GLIMEPIRIDE 2 MG TABLET: 2 | 30 days supply | Qty: 30 | Fill #1

## 2019-09-26 MED FILL — PRAVASTATIN NA 40 MG TAB: 40 | 90 days supply | Qty: 90 | Fill #1

## 2019-09-26 MED FILL — OXYCODONE-ACETAMINOPHEN 5-3: 5-325 | 30 days supply | Qty: 90 | Fill #0

## 2019-09-26 MED FILL — metFORMIN HCL ER 500 MG TB2: 500 | 90 days supply | Qty: 180 | Fill #0

## 2019-09-26 MED FILL — OMEPRAZOLE 40 MG CPDR: 40 | 90 days supply | Qty: 90 | Fill #2

## 2019-09-26 NOTE — Telephone Encounter (Signed)
Last written: 08/25/19 Last ov: 08/25/19 Next ov: 11/14/19 Contract: will get at next visit UDS: will get at next visit

## 2019-10-20 MED FILL — GABAPENTIN 600 MG TABS: 600 | 30 days supply | Qty: 90 | Fill #3

## 2019-10-24 ENCOUNTER — Other Ambulatory Visit: Payer: Self-pay | Admitting: Family Medicine

## 2019-10-24 DIAGNOSIS — G8929 Other chronic pain: Secondary | ICD-10-CM

## 2019-10-25 MED FILL — OXYCODONE-ACETAMINOPHEN 5-3: 5-325 | 30 days supply | Qty: 90 | Fill #0

## 2019-10-25 NOTE — Telephone Encounter (Signed)
Requesting: Percocet Contract: 08/03/2018  UDS: 08/03/2018 Last OV: 08/25/2019 Next OV: 11/14/2019 Last Refill: 09/26/2019, #90--0 RF Database:   Please advise

## 2019-10-28 MED FILL — GLIMEPIRIDE 2 MG TABLET: 2 | 30 days supply | Qty: 30 | Fill #2

## 2019-10-28 MED FILL — SERTRALINE HCL 100 MG TABS: 100 | 90 days supply | Qty: 90 | Fill #1

## 2019-11-14 ENCOUNTER — Ambulatory Visit (INDEPENDENT_AMBULATORY_CARE_PROVIDER_SITE_OTHER): Payer: Self-pay | Admitting: Family Medicine

## 2019-11-14 ENCOUNTER — Encounter: Payer: Self-pay | Admitting: Family Medicine

## 2019-11-14 ENCOUNTER — Other Ambulatory Visit: Payer: Self-pay

## 2019-11-14 VITALS — BP 135/71 | HR 52 | Temp 96.5°F | Resp 16 | Ht 72.0 in | Wt 222.0 lb

## 2019-11-14 DIAGNOSIS — Z20822 Contact with and (suspected) exposure to covid-19: Secondary | ICD-10-CM

## 2019-11-14 DIAGNOSIS — E785 Hyperlipidemia, unspecified: Secondary | ICD-10-CM

## 2019-11-14 DIAGNOSIS — G4700 Insomnia, unspecified: Secondary | ICD-10-CM

## 2019-11-14 DIAGNOSIS — E1165 Type 2 diabetes mellitus with hyperglycemia: Secondary | ICD-10-CM

## 2019-11-14 DIAGNOSIS — G8929 Other chronic pain: Secondary | ICD-10-CM

## 2019-11-14 DIAGNOSIS — M542 Cervicalgia: Secondary | ICD-10-CM

## 2019-11-14 DIAGNOSIS — Z79899 Other long term (current) drug therapy: Secondary | ICD-10-CM

## 2019-11-14 DIAGNOSIS — I10 Essential (primary) hypertension: Secondary | ICD-10-CM

## 2019-11-14 DIAGNOSIS — E1151 Type 2 diabetes mellitus with diabetic peripheral angiopathy without gangrene: Secondary | ICD-10-CM

## 2019-11-14 DIAGNOSIS — E1169 Type 2 diabetes mellitus with other specified complication: Secondary | ICD-10-CM

## 2019-11-14 DIAGNOSIS — Z20828 Contact with and (suspected) exposure to other viral communicable diseases: Secondary | ICD-10-CM

## 2019-11-14 DIAGNOSIS — IMO0002 Reserved for concepts with insufficient information to code with codable children: Secondary | ICD-10-CM

## 2019-11-14 LAB — COMPREHENSIVE METABOLIC PANEL
ALT: 44 U/L (ref 0–53)
AST: 22 U/L (ref 0–37)
Albumin: 4.6 g/dL (ref 3.5–5.2)
Alkaline Phosphatase: 66 U/L (ref 39–117)
BUN: 20 mg/dL (ref 6–23)
CO2: 29 mEq/L (ref 19–32)
Calcium: 9.9 mg/dL (ref 8.4–10.5)
Chloride: 100 mEq/L (ref 96–112)
Creatinine, Ser: 0.88 mg/dL (ref 0.40–1.50)
GFR: 87.08 mL/min (ref >60.00)
Glucose, Bld: 158 mg/dL — ABNORMAL HIGH (ref 70–99)
Potassium: 4.7 mEq/L (ref 3.5–5.1)
Sodium: 138 mEq/L (ref 135–145)
Total Bilirubin: 0.6 mg/dL (ref 0.2–1.2)
Total Protein: 7.2 g/dL (ref 6.0–8.3)

## 2019-11-14 LAB — LIPID PANEL
Cholesterol: 205 mg/dL — ABNORMAL HIGH (ref 0–200)
HDL: 32.7 mg/dL — ABNORMAL LOW (ref >39.00)
NonHDL: 171.88
Total CHOL/HDL Ratio: 6
Triglycerides: 308 mg/dL — ABNORMAL HIGH (ref 0.0–149.0)
VLDL: 61.6 mg/dL — ABNORMAL HIGH (ref 0.0–40.0)

## 2019-11-14 LAB — SARS-COV-2 IGG: SARS-COV-2 IgG: 0.03

## 2019-11-14 LAB — LDL CHOLESTEROL, DIRECT: Direct LDL: 119 mg/dL

## 2019-11-14 LAB — MICROALBUMIN / CREATININE URINE RATIO
Creatinine,U: 112.6 mg/dL
Microalb Creat Ratio: 1.6 mg/g (ref 0.0–30.0)
Microalb, Ur: 1.8 mg/dL (ref 0.0–1.9)

## 2019-11-14 LAB — HEMOGLOBIN A1C: Hgb A1c MFr Bld: 7.3 % — ABNORMAL HIGH (ref 4.6–6.5)

## 2019-11-14 MED ORDER — TRAZODONE HCL 50 MG PO TABS: 25.0000 mg | ORAL_TABLET | Freq: Every evening | ORAL | 3 refills | Status: DC | PRN

## 2019-11-14 MED ORDER — OXYCODONE-ACETAMINOPHEN 5-325 MG PO TABS: ORAL_TABLET | ORAL | 0 refills | Status: DC

## 2019-11-14 MED FILL — traZODone HCL 50 MG TABS: 50 | 30 days supply | Qty: 30 | Fill #0

## 2019-11-14 NOTE — Patient Instructions (Signed)
Carbohydrate Counting for Diabetes Mellitus, Adult  Carbohydrate counting is a method of keeping track of how many carbohydrates you eat. Eating carbohydrates naturally increases the amount of sugar (glucose) in the blood. Counting how many carbohydrates you eat helps keep your blood glucose within normal limits, which helps you manage your diabetes (diabetes mellitus). It is important to know how many carbohydrates you can safely have in each meal. This is different for every person. A diet and nutrition specialist (registered dietitian) can help you make a meal plan and calculate how many carbohydrates you should have at each meal and snack. Carbohydrates are found in the following foods:  Grains, such as breads and cereals.  Dried beans and soy products.  Starchy vegetables, such as potatoes, peas, and corn.  Fruit and fruit juices.  Milk and yogurt.  Sweets and snack foods, such as cake, cookies, candy, chips, and soft drinks. How do I count carbohydrates? There are two ways to count carbohydrates in food. You can use either of the methods or a combination of both. Reading "Nutrition Facts" on packaged food The "Nutrition Facts" list is included on the labels of almost all packaged foods and beverages in the [place] It includes:  The serving size.  Information about nutrients in each serving, including the grams (g) of carbohydrate per serving. To use the "Nutrition Facts":  Decide how many servings you will have.  Multiply the number of servings by the number of carbohydrates per serving.  The resulting number is the total amount of carbohydrates that you will be having. Learning standard serving sizes of other foods When you eat carbohydrate foods that are not packaged or do not include "Nutrition Facts" on the label, you need to measure the servings in order to count the amount of carbohydrates:  Measure the foods that you will eat with a food scale or measuring cup, if needed.   Decide how many standard-size servings you will eat.  Multiply the number of servings by 15. Most carbohydrate-rich foods have about 15 g of carbohydrates per serving. ? For example, if you eat 8 oz (170 g) of strawberries, you will have eaten 2 servings and 30 g of carbohydrates (2 servings x 15 g = 30 g).  For foods that have more than one food mixed, such as soups and casseroles, you must count the carbohydrates in each food that is included. The following list contains standard serving sizes of common carbohydrate-rich foods. Each of these servings has about 15 g of carbohydrates:   hamburger bun or  English muffin.   oz (15 mL) syrup.   oz (14 g) jelly.  1 slice of bread.  1 six-inch tortilla.  3 oz (85 g) cooked rice or pasta.  4 oz (113 g) cooked dried beans.  4 oz (113 g) starchy vegetable, such as peas, corn, or potatoes.  4 oz (113 g) hot cereal.  4 oz (113 g) mashed potatoes or  of a large baked potato.  4 oz (113 g) canned or frozen fruit.  4 oz (120 mL) fruit juice.  4-6 crackers.  6 chicken nuggets.  6 oz (170 g) unsweetened dry cereal.  6 oz (170 g) plain fat-free yogurt or yogurt sweetened with artificial sweeteners.  8 oz (240 mL) milk.  8 oz (170 g) fresh fruit or one small piece of fruit.  24 oz (680 g) popped popcorn. Example of carbohydrate counting Sample meal  3 oz (85 g) chicken breast.  6 oz (170 g)   brown rice.  4 oz (113 g) corn.  8 oz (240 mL) milk.  8 oz (170 g) strawberries with sugar-free whipped topping. Carbohydrate calculation 1. Identify the foods that contain carbohydrates: ? Rice. ? Corn. ? Milk. ? Strawberries. 2. Calculate how many servings you have of each food: ? 2 servings rice. ? 1 serving corn. ? 1 serving milk. ? 1 serving strawberries. 3. Multiply each number of servings by 15 g: ? 2 servings rice x 15 g = 30 g. ? 1 serving corn x 15 g = 15 g. ? 1 serving milk x 15 g = 15 g. ? 1 serving  strawberries x 15 g = 15 g. 4. Add together all of the amounts to find the total grams of carbohydrates eaten: ? 30 g + 15 g + 15 g + 15 g = 75 g of carbohydrates total. Summary  Carbohydrate counting is a method of keeping track of how many carbohydrates you eat.  Eating carbohydrates naturally increases the amount of sugar (glucose) in the blood.  Counting how many carbohydrates you eat helps keep your blood glucose within normal limits, which helps you manage your diabetes.  A diet and nutrition specialist (registered dietitian) can help you make a meal plan and calculate how many carbohydrates you should have at each meal and snack. This information is not intended to replace advice given to you by your health care provider. Make sure you discuss any questions you have with your health care provider. Document Released: 12/15/2005 Document Revised: 07/09/2017 Document Reviewed: 05/28/2016 Elsevier Patient Education  2020 [employer]  

## 2019-11-14 NOTE — Assessment & Plan Note (Signed)
F/u ortho Refill pain meds

## 2019-11-14 NOTE — Progress Notes (Signed)
Patient ID: Ronald Petersen, male    DOB: [birth year]  Age: 63 y.o. MRN: [id number]    Subjective:  Subjective  HPI ARLOS KRUMM presents for f/u bp, chol and dm.   Pt also needs refill on pain meds   Review of Systems  Constitutional: Negative for appetite change, diaphoresis, fatigue and unexpected weight change.  Eyes: Negative for pain, redness and visual disturbance.  Respiratory: Negative for cough, chest tightness, shortness of breath and wheezing.   Cardiovascular: Negative for chest pain, palpitations and leg swelling.  Endocrine: Negative for cold intolerance, heat intolerance, polydipsia, polyphagia and polyuria.  Genitourinary: Negative for difficulty urinating, dysuria and frequency.  Neurological: Negative for dizziness, light-headedness, numbness and headaches.    History Past Medical History:  Diagnosis Date   Anxiety    Arthritis    Cancer ([place])    Complication of anesthesia    aspirated with back surgery at age 57   Depression    Diabetes mellitus    GERD (gastroesophageal reflux disease)    Hyperlipidemia    Hypertension    Neuromuscular disorder ([place])    NEUROPATHY   Right rotator cuff tear 11/23/2018   Sleep apnea    no CPAP    He has a past surgical history that includes Appendectomy; Cholecystectomy; Lumbar laminectomy; Shoulder arthroscopy with rotator cuff repair and subacromial decompression (Right, 11/23/2018); and Arthroscopic rotator cuff repair (Right, 11/23/2018).   His family history includes Alcohol abuse in an other family member; Arthritis in an other family member; COPD in his mother; Colon cancer in an other family member; Coronary artery disease in an other family member; Depression in an other family member; Heart disease in his sister and sister; Hypertension in an other family member; Ovarian cancer in his sister and other family members; Stomach cancer in his maternal grandmother and sister; Stroke in his father.He  reports that he quit smoking about 33 years ago. His smoking use included cigarettes. He quit after 16.00 years of use. He has never used smokeless tobacco. He reports current alcohol use of about 4.0 - 5.0 standard drinks of alcohol per week. He reports that he does not use drugs.  Current Outpatient Medications on File Prior to Visit  Medication Sig Dispense Refill   BAYER CONTOUR TEST test strip Check Blood sugar once daily. Dx E11.9 50 each 12   BAYER MICROLET LANCETS lancets Check Blood sugar once daily. Dx E11.9 50 each 11   gabapentin (NEURONTIN) 600 MG tablet Take 1 tablet (600 mg total) by mouth 3 (three) times daily. 90 tablet 5   glimepiride ([name]) 2 MG tablet TAKE 1 TABLET (2 MG TOTAL) BY MOUTH DAILY BEFORE BREAKFAST. 30 tablet 2   glucose blood test strip Use as instructed 100 each 12   lisinopril (ZESTRIL) 5 MG tablet TAKE 1 TABLET (5 MG TOTAL) BY MOUTH DAILY. 90 tablet 1   metFORMIN (GLUCOPHAGE-XR) 500 MG 24 hr tablet TAKE TWO TABLETS BY MOUTH DAILY 180 tablet 1   omeprazole (PRILOSEC) 40 MG capsule TAKE 1 CAPSULE (40 MG TOTAL) BY MOUTH DAILY. 90 capsule 3   ondansetron (ZOFRAN) 4 MG tablet Take 1 tablet (4 mg total) by mouth every 8 (eight) hours as needed. 20 tablet 0   pravastatin (PRAVACHOL) 40 MG tablet Take 1 tablet (40 mg total) by mouth daily. 90 tablet 3   sennosides-docusate sodium (SENOKOT-S) 8.6-50 MG tablet Take 2 tablets by mouth daily. 30 tablet 1   sertraline (ZOLOFT) 100 MG tablet Take 1  tablet (100 mg total) by mouth daily. 180 tablet 1   tiZANidine (ZANAFLEX) 4 MG tablet TAKE 1 TABLET (4 MG TOTAL) BY MOUTH EVERY 6 (SIX) HOURS AS NEEDED FOR MUSCLE SPASMS. 30 tablet 1   No current facility-administered medications on file prior to visit.      Objective:  Objective  Physical Exam Vitals signs and nursing note reviewed.  Constitutional:      General: He is sleeping. He is not in acute distress.    Appearance: He is well-developed.  HENT:      Head: Normocephalic and atraumatic.  Eyes:     Pupils: Pupils are equal, round, and reactive to light.  Neck:     Musculoskeletal: Normal range of motion and neck supple.     Thyroid: No thyromegaly.  Cardiovascular:     Rate and Rhythm: Normal rate and regular rhythm.     Heart sounds: Normal heart sounds. No murmur.  Pulmonary:     Effort: Pulmonary effort is normal. No respiratory distress.     Breath sounds: Normal breath sounds. No wheezing or rales.  Chest:     Chest wall: No tenderness.  Musculoskeletal:        General: No tenderness.  Skin:    General: Skin is warm and dry.  Neurological:     Mental Status: He is oriented to person, place, and time.  Psychiatric:        Behavior: Behavior normal.        Thought Content: Thought content normal.        Judgment: Judgment normal.    Diabetic Foot Exam - Simple   Simple Foot Form Diabetic Foot exam was performed with the following findings: Yes 11/14/2019 12:28 PM  Visual Inspection No deformities, no ulcerations, no other skin breakdown bilaterally: Yes Sensation Testing Intact to touch and monofilament testing bilaterally: Yes Pulse Check Posterior Tibialis and Dorsalis pulse intact bilaterally: Yes Comments     BP 135/71 (BP Location: Left Arm, Patient Position: Sitting, Cuff Size: Small)    Pulse (!) 52    Temp (!) 96.5 F (35.8 C) (Temporal)    Resp 16    Ht 6' (1.829 m)    Wt 222 lb (100.7 kg)    SpO2 99%    BMI 30.11 kg/m  Wt Readings from Last 3 Encounters:  11/14/19 222 lb (100.7 kg)  07/26/19 200 lb (90.7 kg)  06/21/19 200 lb (90.7 kg)     Lab Results  Component Value Date   WBC 8.0 05/20/2019   HGB 15.4 05/20/2019   HCT 43.1 05/20/2019   PLT 192.0 05/20/2019   GLUCOSE 219 (H) 05/20/2019   CHOL 246 (H) 05/20/2019   TRIG 313.0 (H) 05/20/2019   HDL 34.00 (L) 05/20/2019   LDLDIRECT 145.0 05/20/2019   LDLCALC 139 (H) 06/12/2016   ALT 19 05/20/2019   AST 12 05/20/2019   NA 137 05/20/2019   K  3.8 05/20/2019   CL 98 05/20/2019   CREATININE 0.89 05/20/2019   BUN 17 05/20/2019   CO2 29 05/20/2019   TSH 0.97 10/25/2012   PSA 1.38 02/09/2019   HGBA1C 9.7 (H) 05/20/2019   MICROALBUR 7.6 (H) 05/20/2019    Mr Cervical Spine W/o Contrast  Result Date: 07/24/2019 CLINICAL DATA:  Neck pain.  Right-greater-than-left symptoms. EXAM: MRI CERVICAL SPINE WITHOUT CONTRAST TECHNIQUE: Multiplanar, multisequence MR imaging of the cervical spine was performed. No intravenous contrast was administered. COMPARISON:  Cervical spine MRI 11/10/2015 FINDINGS: Alignment: Physiologic. Vertebrae: No  fracture, evidence of discitis, or bone lesion. Cord: Normal signal and morphology. Posterior Fossa, vertebral arteries, paraspinal tissues: Visualized posterior fossa is normal. Vertebral artery flow voids are preserved. No prevertebral soft tissue swelling. Disc levels: C2-3: Normal. C3-4: Left subarticular disc protrusion, unchanged. No spinal canal or neural foraminal stenosis. C4-5: Normal. C5-6: Unchanged intermediate sized left subarticular disc osteophyte complex. Severe left neural foraminal stenosis. No spinal canal or right neural foraminal stenosis. C6-7: Small disc bulge without spinal canal or neural foraminal stenosis. C7-T1: No spinal canal or neural foraminal stenosis. IMPRESSION: Unchanged C5-6 left subarticular disc osteophyte complex with severe left neural foraminal stenosis. Electronically Signed   By: Ulyses Jarred M.D.   On: 07/24/2019 00:47     Assessment & Plan:  Plan  I have changed Ron D. Pacini "Dale"'s oxyCODONE-acetaminophen. I am also having him start on traZODone. Additionally, I am having him maintain his [employer] Lancets, sennosides-docusate sodium, omeprazole, ondansetron, gabapentin, tiZANidine, glucose blood, pravastatin, lisinopril, sertraline, glimepiride, and metFORMIN.  Meds ordered this encounter  Medications   traZODone (DESYREL) 50 MG tablet      Sig: Take 0.5-1 tablets (25-50 mg total) by mouth at bedtime as needed for sleep.    Dispense:  30 tablet    Refill:  3   oxyCODONE-acetaminophen (PERCOCET/ROXICET) 5-325 MG tablet    Sig: 1 po q6h prn    Dispense:  90 tablet    Refill:  0    Problem List Items Addressed This Visit      Unprioritized   DM (diabetes mellitus) type II uncontrolled, periph vascular disorder ([place])    hgba1c to be checked , minimize simple carbs. Increase exercise as tolerated. Continue current meds      Essential hypertension    Well controlled, no changes to meds. Encouraged heart healthy diet such as the DASH diet and exercise as tolerated.       Relevant Orders   Lipid panel   Hemoglobin A1c   Comprehensive metabolic panel   Microalbumin / creatinine urine ratio   Microalbumin / creatinine urine ratio   Lipid panel   Hemoglobin A1c   Comprehensive metabolic panel   Hyperlipidemia associated with type 2 diabetes mellitus (HCC)   Relevant Orders   Lipid panel   Hemoglobin A1c   Comprehensive metabolic panel   Microalbumin / creatinine urine ratio   Microalbumin / creatinine urine ratio   Lipid panel   Comprehensive metabolic panel   Hyperlipidemia LDL goal <70    Tolerating statin, encouraged heart healthy diet, avoid trans fats, minimize simple carbs and saturated fats. Increase exercise as tolerated      Neck pain    F/u ortho Refill pain meds       Other Visit Diagnoses    High risk medication use    -  Primary   Relevant Orders   Pain Mgmt, Profile 8 w/Conf, U   Uncontrolled type 2 diabetes mellitus with hyperglycemia (HCC)       Relevant Orders   Lipid panel   Hemoglobin A1c   Comprehensive metabolic panel   Microalbumin / creatinine urine ratio   Ambulatory referral to Podiatry   Microalbumin / creatinine urine ratio   Hemoglobin A1c   Insomnia, unspecified type       Relevant Medications   traZODone (DESYREL) 50 MG tablet   Other chronic pain       Relevant  Medications   traZODone (DESYREL) 50 MG tablet   oxyCODONE-acetaminophen (PERCOCET/ROXICET) 5-325  MG tablet   Exposure to COVID-19 virus       Relevant Orders   SARS-COV-2 IgG      Follow-up: Return in about 6 months (around 05/13/2020), or if symptoms worsen or fail to improve, for hypertension, hyperlipidemia, diabetes II.  [name], DO

## 2019-11-14 NOTE — Assessment & Plan Note (Signed)
hgba1c to be checked, minimize simple carbs. Increase exercise as tolerated. Continue current meds  

## 2019-11-14 NOTE — Assessment & Plan Note (Signed)
Well controlled, no changes to meds. Encouraged heart healthy diet such as the DASH diet and exercise as tolerated.  °

## 2019-11-14 NOTE — Assessment & Plan Note (Signed)
Tolerating statin, encouraged heart healthy diet, avoid trans fats, minimize simple carbs and saturated fats. Increase exercise as tolerated 

## 2019-11-16 LAB — PAIN MGMT, PROFILE 8 W/CONF, U
6 Acetylmorphine: NEGATIVE ng/mL
Alcohol Metabolites: NEGATIVE ng/mL (ref <500)
Amphetamines: NEGATIVE ng/mL
Benzodiazepines: NEGATIVE ng/mL
Buprenorphine, Urine: NEGATIVE ng/mL
Cocaine Metabolite: NEGATIVE ng/mL
Codeine: NEGATIVE ng/mL
Creatinine: 119.8 mg/dL
Hydrocodone: NEGATIVE ng/mL
Hydromorphone: NEGATIVE ng/mL
MDMA: NEGATIVE ng/mL
Marijuana Metabolite: NEGATIVE ng/mL
Morphine: NEGATIVE ng/mL
Norhydrocodone: NEGATIVE ng/mL
Noroxycodone: 1597 ng/mL
Opiates: NEGATIVE ng/mL
Oxidant: NEGATIVE ug/mL
Oxycodone: 926 ng/mL
Oxycodone: POSITIVE ng/mL
Oxymorphone: 576 ng/mL
pH: 5.4 (ref 4.5–9.0)

## 2019-11-23 ENCOUNTER — Other Ambulatory Visit: Payer: Self-pay | Admitting: Family Medicine

## 2019-11-23 DIAGNOSIS — E1165 Type 2 diabetes mellitus with hyperglycemia: Secondary | ICD-10-CM

## 2019-11-23 MED FILL — GABAPENTIN 600 MG TABS: 600 | 30 days supply | Qty: 90 | Fill #4

## 2019-11-23 MED FILL — GLIMEPIRIDE 2 MG TABLET: 2 | 30 days supply | Qty: 30 | Fill #0

## 2019-11-28 MED FILL — OXYCODONE-ACETAMINOPHEN 5-3: 5-325 | 22 days supply | Qty: 90 | Fill #0

## 2019-12-15 ENCOUNTER — Ambulatory Visit: Payer: Medicaid Other | Attending: Internal Medicine

## 2019-12-15 DIAGNOSIS — Z20822 Contact with and (suspected) exposure to covid-19: Secondary | ICD-10-CM

## 2019-12-17 LAB — NOVEL CORONAVIRUS, NAA: SARS-CoV-2, NAA: NOT DETECTED

## 2019-12-20 MED FILL — GLIMEPIRIDE 2 MG TABLET: 2 | 30 days supply | Qty: 30 | Fill #1

## 2019-12-20 MED FILL — LISINOPRIL 5 MG TABLET: 5 | 90 days supply | Qty: 90 | Fill #1

## 2019-12-20 MED FILL — GABAPENTIN 600 MG TABLET: 600 | 30 days supply | Qty: 90 | Fill #5

## 2019-12-20 MED FILL — OMEPRAZOLE 40 MG CPDR: 40 | 90 days supply | Qty: 90 | Fill #3

## 2019-12-21 ENCOUNTER — Other Ambulatory Visit: Payer: Self-pay | Admitting: Family Medicine

## 2019-12-21 DIAGNOSIS — G8929 Other chronic pain: Secondary | ICD-10-CM

## 2019-12-22 NOTE — Telephone Encounter (Signed)
Requesting: Percocet Contract: 08/03/2018 UDS: 11/14/2019 Last OV: 11/14/2019 Next OV: 05/15/2020 Last Refill: 11/14/2019, #90--0 RF Database:   Please advise

## 2019-12-26 MED FILL — OXYCODONE-ACETAMINOPHEN 5-3: 5-325 | 23 days supply | Qty: 90 | Fill #0

## 2019-12-28 MED FILL — metFORMIN HCL ER 500 MG TB2: 500 | 90 days supply | Qty: 180 | Fill #1

## 2020-01-09 ENCOUNTER — Other Ambulatory Visit: Payer: Self-pay | Admitting: Family Medicine

## 2020-01-09 DIAGNOSIS — K219 Gastro-esophageal reflux disease without esophagitis: Secondary | ICD-10-CM

## 2020-01-25 ENCOUNTER — Other Ambulatory Visit: Payer: Self-pay | Admitting: Family Medicine

## 2020-01-25 DIAGNOSIS — G8929 Other chronic pain: Secondary | ICD-10-CM

## 2020-01-25 MED FILL — SERTRALINE HCL 100 MG TABS: 100 | 90 days supply | Qty: 90 | Fill #2

## 2020-01-26 MED FILL — OXYCODONE-ACETAMINOPHEN 5-3: 5-325 | 23 days supply | Qty: 90 | Fill #0

## 2020-01-26 NOTE — Telephone Encounter (Signed)
Requesting: Percocet Contract: 08/03/2018 UDS: 11/14/19, low risk Last OV: 11/14/2019 Next OV: 05/15/2020 Last Refill: 12/24/2019, #90--0 RF Database:   Please advise

## 2020-01-27 MED FILL — GLIMEPIRIDE 2 MG TABLET: 2 | 30 days supply | Qty: 30 | Fill #2

## 2020-02-06 ENCOUNTER — Other Ambulatory Visit: Payer: Self-pay

## 2020-02-07 ENCOUNTER — Other Ambulatory Visit: Payer: Self-pay

## 2020-02-07 ENCOUNTER — Encounter: Payer: Self-pay | Admitting: Family Medicine

## 2020-02-07 ENCOUNTER — Ambulatory Visit (INDEPENDENT_AMBULATORY_CARE_PROVIDER_SITE_OTHER): Payer: Medicaid Other | Admitting: Family Medicine

## 2020-02-07 VITALS — BP 130/68 | HR 58 | Temp 97.1°F | Resp 18 | Ht 72.0 in | Wt 223.6 lb

## 2020-02-07 DIAGNOSIS — E1169 Type 2 diabetes mellitus with other specified complication: Secondary | ICD-10-CM

## 2020-02-07 DIAGNOSIS — R296 Repeated falls: Secondary | ICD-10-CM | POA: Insufficient documentation

## 2020-02-07 DIAGNOSIS — H538 Other visual disturbances: Secondary | ICD-10-CM

## 2020-02-07 DIAGNOSIS — E785 Hyperlipidemia, unspecified: Secondary | ICD-10-CM

## 2020-02-07 DIAGNOSIS — E1165 Type 2 diabetes mellitus with hyperglycemia: Secondary | ICD-10-CM

## 2020-02-07 DIAGNOSIS — R29898 Other symptoms and signs involving the musculoskeletal system: Secondary | ICD-10-CM

## 2020-02-07 DIAGNOSIS — M48061 Spinal stenosis, lumbar region without neurogenic claudication: Secondary | ICD-10-CM

## 2020-02-07 DIAGNOSIS — M4722 Other spondylosis with radiculopathy, cervical region: Secondary | ICD-10-CM

## 2020-02-07 DIAGNOSIS — I10 Essential (primary) hypertension: Secondary | ICD-10-CM

## 2020-02-07 LAB — COMPREHENSIVE METABOLIC PANEL
ALT: 32 U/L (ref 0–53)
AST: 20 U/L (ref 0–37)
Albumin: 4.8 g/dL (ref 3.5–5.2)
Alkaline Phosphatase: 72 U/L (ref 39–117)
BUN: 14 mg/dL (ref 6–23)
CO2: 28 mEq/L (ref 19–32)
Calcium: 9.9 mg/dL (ref 8.4–10.5)
Chloride: 102 mEq/L (ref 96–112)
Creatinine, Ser: 0.82 mg/dL (ref 0.40–1.50)
GFR: 94.41 mL/min (ref >60.00)
Glucose, Bld: 142 mg/dL — ABNORMAL HIGH (ref 70–99)
Potassium: 4.4 mEq/L (ref 3.5–5.1)
Sodium: 136 mEq/L (ref 135–145)
Total Bilirubin: 0.6 mg/dL (ref 0.2–1.2)
Total Protein: 7.5 g/dL (ref 6.0–8.3)

## 2020-02-07 LAB — POC URINALSYSI DIPSTICK (AUTOMATED)
Bilirubin, UA: NEGATIVE
Blood, UA: NEGATIVE
Glucose, UA: NEGATIVE
Ketones, UA: NEGATIVE
Leukocytes, UA: NEGATIVE
Nitrite, UA: NEGATIVE
Protein, UA: POSITIVE — AB
Spec Grav, UA: 1.02 (ref 1.010–1.025)
Urobilinogen, UA: 0.2 E.U./dL
pH, UA: 6 (ref 5.0–8.0)

## 2020-02-07 LAB — CBC WITH DIFFERENTIAL/PLATELET
Basophils Absolute: 0 10*3/uL (ref 0.0–0.1)
Basophils Relative: 0.6 % (ref 0.0–3.0)
Eosinophils Absolute: 0.2 10*3/uL (ref 0.0–0.7)
Eosinophils Relative: 2.6 % (ref 0.0–5.0)
HCT: 44 % (ref 39.0–52.0)
Hemoglobin: 14.8 g/dL (ref 13.0–17.0)
Lymphocytes Relative: 30.2 % (ref 12.0–46.0)
Lymphs Abs: 2.1 10*3/uL (ref 0.7–4.0)
MCHC: 33.6 g/dL (ref 30.0–36.0)
MCV: 84 fl (ref 78.0–100.0)
Monocytes Absolute: 0.5 10*3/uL (ref 0.1–1.0)
Monocytes Relative: 6.6 % (ref 3.0–12.0)
Neutro Abs: 4.2 10*3/uL (ref 1.4–7.7)
Neutrophils Relative %: 60 % (ref 43.0–77.0)
Platelets: 193 10*3/uL (ref 150.0–400.0)
RBC: 5.24 Mil/uL (ref 4.22–5.81)
RDW: 12.7 % (ref 11.5–15.5)
WBC: 6.9 10*3/uL (ref 4.0–10.5)

## 2020-02-07 LAB — HEMOGLOBIN A1C: Hgb A1c MFr Bld: 8.3 % — ABNORMAL HIGH (ref 4.6–6.5)

## 2020-02-07 LAB — LIPID PANEL
Cholesterol: 183 mg/dL (ref 0–200)
HDL: 32.1 mg/dL — ABNORMAL LOW (ref >39.00)
LDL Cholesterol: 113 mg/dL — ABNORMAL HIGH (ref 0–99)
NonHDL: 150.57
Total CHOL/HDL Ratio: 6
Triglycerides: 187 mg/dL — ABNORMAL HIGH (ref 0.0–149.0)
VLDL: 37.4 mg/dL (ref 0.0–40.0)

## 2020-02-07 LAB — MICROALBUMIN / CREATININE URINE RATIO
Creatinine,U: 99 mg/dL
Microalb Creat Ratio: 2.1 mg/g (ref 0.0–30.0)
Microalb, Ur: 2.1 mg/dL — ABNORMAL HIGH (ref 0.0–1.9)

## 2020-02-07 NOTE — Assessment & Plan Note (Signed)
Tolerating statin, encouraged heart healthy diet, avoid trans fats, minimize simple carbs and saturated fats. Increase exercise as tolerated 

## 2020-02-07 NOTE — Assessment & Plan Note (Signed)
Well controlled, no changes to meds. Encouraged heart healthy diet such as the DASH diet and exercise as tolerated.  °

## 2020-02-07 NOTE — Assessment & Plan Note (Signed)
F/u ortho for lumbar stenosis  Pt has seen neuro

## 2020-02-07 NOTE — Assessment & Plan Note (Signed)
With frequent falls f/u ortho for lumbar stenosis

## 2020-02-07 NOTE — Assessment & Plan Note (Signed)
CLINICAL DATA:  . Bilateral leg weakness and low back pain with sciatica  EXAM: MRI LUMBAR SPINE WITHOUT CONTRAST  TECHNIQUE: Multiplanar, multisequence MR imaging of the lumbar spine was performed. No intravenous contrast was administered.  COMPARISON:  CT abdomen from 05/27/2019  FINDINGS: Segmentation: The lowest lumbar type non-rib-bearing vertebra is labeled as L5.  Alignment:  No vertebral subluxation is observed.  Vertebrae: Type 2 degenerative endplate findings at 075-GRM and to a lesser extent at L1-2 and L5-S1. Schmorl's node along the anterior inferior endplate of L1. Disc desiccation at L4-5 and L5-S1 with loss of disc height especially at L5-S1.  Conus medullaris and cauda equina: Conus extends to the L1 level. Conus and cauda equina appear normal.  Paraspinal and other soft tissues: A fluid signal intensity lesion of the left kidney is partially characterized on today's exam. This is statistically likely to be a cyst but not technically specific.  Disc levels:  L1-2: No impingement.  Diffuse disc bulge.  L2-3: Mild displacement of the right L2 nerve in the lateral extraforaminal space due to right lateral extraforaminal disc protrusion. Diffuse disc bulge.  L3-4: No impingement.  Diffuse disc bulge.  L4-5: Mild bilateral subarticular lateral recess stenosis due to disc bulge and mild left facet arthropathy.  L5-S1: Borderline bilateral foraminal stenosis due to intervertebral and facet spurring as well as disc bulge. Prior right laminectomy.  IMPRESSION: 1. Lumbar spondylosis and degenerative disc disease, causing mild impingement at L2-3 and L4-5 as detailed above.   Electronically Signed   By: Van Clines M.D.   On: 06/11/2019 16:59   Result History  MR LUMBAR SPINE WO CONTRAST (Order [id number]) on 06/11/2019 - Order Result History Report  MR LUMBAR SPINE WO CONTRAST: Result Notes  Pieter Partridge, DO  06/13/2019 12:38 PM  EDT    MRI shows degenerative disc disease in the spine causing some nerve irritation but nothing significant. Incidentally, his left kidney is partially seen, which demonstrates a probable cyst. Likely of no clinical importance but if he should have further questions, I direct him to discuss with his PCP. No further recommendations at this time.      MyChart Results Release  MyChart Status: Active Results Release  Encounter-Level Documents - 06/11/2019:  Scan on 06/11/2019 2:18 PM by Default, Provider, MD     Order-Level Documents:  There are no order-level documents. Hospital account-Level Documents:  There are no hospital account-level documents. Orders Requiring a Screening Form  Procedure Order Status Form Status  MR LUMBAR SPINE WO CONTRAST Completed Completed  Vitals  Height Weight BMI (Calculated)  6' (1.829 m) 200 lb (90.7 kg) 27.12  Protocol Documents  Imaging Protocol  Imaging  Imaging Information  Resulted by:  Signed Date/Time  Phone Pager  Sherryl Barters 06/11/2019 4:59 PM [phone]  MR LUMBAR SPINE WO CONTRAST: Result Notes  Pieter Partridge, DO  06/13/2019 12:38 PM EDT    MRI shows degenerative disc disease in the spine causing some nerve irritation but nothing significant. Incidentally, his left kidney is partially seen, which demonstrates a probable cyst. Likely of no clinical importance but if he should have further questions, I direct him to discuss with his PCP. No further recommendations at this time.      Study Notes   Dimas Millin, RT on 06/11/2019 11:57 AM  Leg weakness, bilateral and Bilateral low back pain with bilateral sciatica NKI    Original Order  Ordered On Ordered By   06/03/2019 1:30 PM Burns,  Edman Circle, CMA       External Result Report  External Result Report

## 2020-02-07 NOTE — Patient Instructions (Signed)
COVID-19 Vaccine Information can be found at: [email or web address] For questions related to vaccine distribution or appointments, please email [email or web address] or call [phone].       Carbohydrate Counting for Diabetes Mellitus, Adult  Carbohydrate counting is a method of keeping track of how many carbohydrates you eat. Eating carbohydrates naturally increases the amount of sugar (glucose) in the blood. Counting how many carbohydrates you eat helps keep your blood glucose within normal limits, which helps you manage your diabetes (diabetes mellitus). It is important to know how many carbohydrates you can safely have in each meal. This is different for every person. A diet and nutrition specialist (registered dietitian) can help you make a meal plan and calculate how many carbohydrates you should have at each meal and snack. Carbohydrates are found in the following foods:  Grains, such as breads and cereals.  Dried beans and soy products.  Starchy vegetables, such as potatoes, peas, and corn.  Fruit and fruit juices.  Milk and yogurt.  Sweets and snack foods, such as cake, cookies, candy, chips, and soft drinks. How do I count carbohydrates? There are two ways to count carbohydrates in food. You can use either of the methods or a combination of both. Reading "Nutrition Facts" on packaged food The "Nutrition Facts" list is included on the labels of almost all packaged foods and beverages in the [place] It includes:  The serving size.  Information about nutrients in each serving, including the grams (g) of carbohydrate per serving. To use the "Nutrition Facts":  Decide how many servings you will have.  Multiply the number of servings by the number of carbohydrates per serving.  The resulting number is the total amount of carbohydrates that you will be having. Learning standard serving sizes of other foods When you  eat carbohydrate foods that are not packaged or do not include "Nutrition Facts" on the label, you need to measure the servings in order to count the amount of carbohydrates:  Measure the foods that you will eat with a food scale or measuring cup, if needed.  Decide how many standard-size servings you will eat.  Multiply the number of servings by 15. Most carbohydrate-rich foods have about 15 g of carbohydrates per serving. ? For example, if you eat 8 oz (170 g) of strawberries, you will have eaten 2 servings and 30 g of carbohydrates (2 servings x 15 g = 30 g).  For foods that have more than one food mixed, such as soups and casseroles, you must count the carbohydrates in each food that is included. The following list contains standard serving sizes of common carbohydrate-rich foods. Each of these servings has about 15 g of carbohydrates:   hamburger bun or  English muffin.   oz (15 mL) syrup.   oz (14 g) jelly.  1 slice of bread.  1 six-inch tortilla.  3 oz (85 g) cooked rice or pasta.  4 oz (113 g) cooked dried beans.  4 oz (113 g) starchy vegetable, such as peas, corn, or potatoes.  4 oz (113 g) hot cereal.  4 oz (113 g) mashed potatoes or  of a large baked potato.  4 oz (113 g) canned or frozen fruit.  4 oz (120 mL) fruit juice.  4-6 crackers.  6 chicken nuggets.  6 oz (170 g) unsweetened dry cereal.  6 oz (170 g) plain fat-free yogurt or yogurt sweetened with artificial sweeteners.  8 oz (240 mL) milk.  8 oz (170 g) fresh fruit or one  small piece of fruit.  24 oz (680 g) popped popcorn. Example of carbohydrate counting Sample meal  3 oz (85 g) chicken breast.  6 oz (170 g) brown rice.  4 oz (113 g) corn.  8 oz (240 mL) milk.  8 oz (170 g) strawberries with sugar-free whipped topping. Carbohydrate calculation 1. Identify the foods that contain carbohydrates: ? Rice. ? Corn. ? Milk. ? Strawberries. 2. Calculate how many servings you have of  each food: ? 2 servings rice. ? 1 serving corn. ? 1 serving milk. ? 1 serving strawberries. 3. Multiply each number of servings by 15 g: ? 2 servings rice x 15 g = 30 g. ? 1 serving corn x 15 g = 15 g. ? 1 serving milk x 15 g = 15 g. ? 1 serving strawberries x 15 g = 15 g. 4. Add together all of the amounts to find the total grams of carbohydrates eaten: ? 30 g + 15 g + 15 g + 15 g = 75 g of carbohydrates total. Summary  Carbohydrate counting is a method of keeping track of how many carbohydrates you eat.  Eating carbohydrates naturally increases the amount of sugar (glucose) in the blood.  Counting how many carbohydrates you eat helps keep your blood glucose within normal limits, which helps you manage your diabetes.  A diet and nutrition specialist (registered dietitian) can help you make a meal plan and calculate how many carbohydrates you should have at each meal and snack. This information is not intended to replace advice given to you by your health care provider. Make sure you discuss any questions you have with your health care provider. Document Revised: 07/09/2017 Document Reviewed: 05/28/2016 Elsevier Patient Education  [employer].

## 2020-02-07 NOTE — Assessment & Plan Note (Signed)
Fu ortho

## 2020-02-07 NOTE — Progress Notes (Signed)
Patient ID: Ronald Petersen, male    DOB: 03-12-1956  Age: 64 y.o. MRN: [id number]    Subjective:  Subjective  HPI Ronald Petersen presents for f/u dm, chol and bp.   Pt c/o sugars running high.  He now has disability ins and can go to dr.  He needs to f/u ortho for neck and low back.   He has been falling a lot and saw neuro and mri was done. He had ddd and stenosis.    Review of Systems  Constitutional: Negative for appetite change, diaphoresis, fatigue and unexpected weight change.  Eyes: Negative for pain, redness and visual disturbance.  Respiratory: Negative for cough, chest tightness, shortness of breath and wheezing.   Cardiovascular: Negative for chest pain, palpitations and leg swelling.  Endocrine: Negative for cold intolerance, heat intolerance, polydipsia, polyphagia and polyuria.  Genitourinary: Negative for difficulty urinating, dysuria and frequency.  Musculoskeletal: Positive for arthralgias, back pain and neck pain.  Neurological: Positive for weakness. Negative for dizziness, light-headedness, numbness and headaches.    History Past Medical History:  Diagnosis Date  . Anxiety   . Arthritis   . Cancer ([place])   . Complication of anesthesia    aspirated with back surgery at age 61  . Depression   . Diabetes mellitus   . GERD (gastroesophageal reflux disease)   . Hyperlipidemia   . Hypertension   . Neuromuscular disorder ([place])    NEUROPATHY  . Right rotator cuff tear 11/23/2018  . Sleep apnea    no CPAP    He has a past surgical history that includes Appendectomy; Cholecystectomy; Lumbar laminectomy; Shoulder arthroscopy with rotator cuff repair and subacromial decompression (Right, 11/23/2018); and Arthroscopic rotator cuff repair (Right, 11/23/2018).   His family history includes Alcohol abuse in an other family member; Arthritis in an other family member; COPD in his mother; Colon cancer in an other family member; Coronary artery disease in an other family  member; Depression in an other family member; Heart disease in his sister and sister; Hypertension in an other family member; Ovarian cancer in his sister and other family members; Stomach cancer in his maternal grandmother and sister; Stroke in his father.He reports that he quit smoking about 34 years ago. His smoking use included cigarettes. He quit after 16.00 years of use. He has never used smokeless tobacco. He reports current alcohol use of about 4.0 - 5.0 standard drinks of alcohol per week. He reports that he does not use drugs.  Current Outpatient Medications on File Prior to Visit  Medication Sig Dispense Refill  . BAYER CONTOUR TEST test strip Check Blood sugar once daily. Dx E11.9 50 each 12  . BAYER MICROLET LANCETS lancets Check Blood sugar once daily. Dx E11.9 50 each 11  . gabapentin (NEURONTIN) 600 MG tablet Take 1 tablet (600 mg total) by mouth 3 (three) times daily. 90 tablet 5  . glimepiride ([name]) 2 MG tablet TAKE 1 TABLET BY MOUTH ONCE DAILY BEFORE BREAKFAST 30 tablet 2  . glucose blood test strip Use as instructed 100 each 12  . lisinopril (ZESTRIL) 5 MG tablet TAKE 1 TABLET (5 MG TOTAL) BY MOUTH DAILY. 90 tablet 1  . metFORMIN (GLUCOPHAGE-XR) 500 MG 24 hr tablet TAKE TWO TABLETS BY MOUTH DAILY 180 tablet 1  . omeprazole (PRILOSEC) 40 MG capsule TAKE 1 CAPSULE (40 MG TOTAL) BY MOUTH DAILY. 90 capsule 1  . ondansetron (ZOFRAN) 4 MG tablet Take 1 tablet (4 mg total) by mouth every 8 (  eight) hours as needed. 20 tablet 0  . oxyCODONE-acetaminophen (PERCOCET/ROXICET) 5-325 MG tablet TAKE 1 TABLET BY MOUTH EVERY 6 HOURS AS NEEDED 90 tablet 0  . pravastatin (PRAVACHOL) 40 MG tablet Take 1 tablet (40 mg total) by mouth daily. 90 tablet 3  . sennosides-docusate sodium (SENOKOT-S) 8.6-50 MG tablet Take 2 tablets by mouth daily. 30 tablet 1  . sertraline (ZOLOFT) 100 MG tablet Take 1 tablet (100 mg total) by mouth daily. 180 tablet 1  . tiZANidine (ZANAFLEX) 4 MG tablet TAKE 1 TABLET  (4 MG TOTAL) BY MOUTH EVERY 6 (SIX) HOURS AS NEEDED FOR MUSCLE SPASMS. 30 tablet 1  . traZODone (DESYREL) 50 MG tablet Take 0.5-1 tablets (25-50 mg total) by mouth at bedtime as needed for sleep. 30 tablet 3   No current facility-administered medications on file prior to visit.     Objective:  Objective  Physical Exam Vitals and nursing note reviewed.  Constitutional:      General: He is sleeping.     Appearance: He is well-developed.  HENT:     Head: Normocephalic and atraumatic.  Eyes:     Pupils: Pupils are equal, round, and reactive to light.  Neck:     Thyroid: No thyromegaly.  Cardiovascular:     Rate and Rhythm: Normal rate and regular rhythm.     Heart sounds: No murmur.  Pulmonary:     Effort: Pulmonary effort is normal. No respiratory distress.     Breath sounds: Normal breath sounds. No wheezing or rales.  Chest:     Chest wall: No tenderness.  Musculoskeletal:        General: Tenderness present.     Cervical back: Normal range of motion and neck supple.  Skin:    General: Skin is warm and dry.  Neurological:     Mental Status: He is oriented to person, place, and time.  Psychiatric:        Behavior: Behavior normal.        Thought Content: Thought content normal.        Judgment: Judgment normal.    BP 130/68 (BP Location: Left Arm, Patient Position: Sitting, Cuff Size: Large)   Pulse (!) 58   Temp (!) 97.1 F (36.2 C) (Temporal)   Resp 18   Ht 6' (1.829 m)   Wt 223 lb 9.6 oz (101.4 kg)   SpO2 98%   BMI 30.33 kg/m  Wt Readings from Last 3 Encounters:  02/07/20 223 lb 9.6 oz (101.4 kg)  11/14/19 222 lb (100.7 kg)  07/26/19 200 lb (90.7 kg)     Lab Results  Component Value Date   WBC 8.0 05/20/2019   HGB 15.4 05/20/2019   HCT 43.1 05/20/2019   PLT 192.0 05/20/2019   GLUCOSE 158 (H) 11/14/2019   CHOL 205 (H) 11/14/2019   TRIG 308.0 (H) 11/14/2019   HDL 32.70 (L) 11/14/2019   LDLDIRECT 119.0 11/14/2019   LDLCALC 139 (H) 06/12/2016   ALT 44  11/14/2019   AST 22 11/14/2019   NA 138 11/14/2019   K 4.7 11/14/2019   CL 100 11/14/2019   CREATININE 0.88 11/14/2019   BUN 20 11/14/2019   CO2 29 11/14/2019   TSH 0.97 10/25/2012   PSA 1.38 02/09/2019   HGBA1C 7.3 (H) 11/14/2019   MICROALBUR 1.8 11/14/2019    MR Cervical Spine w/o contrast  Result Date: 07/24/2019 CLINICAL DATA:  Neck pain.  Right-greater-than-left symptoms. EXAM: MRI CERVICAL SPINE WITHOUT CONTRAST TECHNIQUE: Multiplanar, multisequence MR imaging  of the cervical spine was performed. No intravenous contrast was administered. COMPARISON:  Cervical spine MRI 11/10/2015 FINDINGS: Alignment: Physiologic. Vertebrae: No fracture, evidence of discitis, or bone lesion. Cord: Normal signal and morphology. Posterior Fossa, vertebral arteries, paraspinal tissues: Visualized posterior fossa is normal. Vertebral artery flow voids are preserved. No prevertebral soft tissue swelling. Disc levels: C2-3: Normal. C3-4: Left subarticular disc protrusion, unchanged. No spinal canal or neural foraminal stenosis. C4-5: Normal. C5-6: Unchanged intermediate sized left subarticular disc osteophyte complex. Severe left neural foraminal stenosis. No spinal canal or right neural foraminal stenosis. C6-7: Small disc bulge without spinal canal or neural foraminal stenosis. C7-T1: No spinal canal or neural foraminal stenosis. IMPRESSION: Unchanged C5-6 left subarticular disc osteophyte complex with severe left neural foraminal stenosis. Electronically Signed   By: Ulyses Jarred M.D.   On: 07/24/2019 00:47     Assessment & Plan:  Plan  I am having Ronald Petersen "[place]" maintain his [employer], Haematologist, sennosides-docusate sodium, ondansetron, gabapentin, tiZANidine, glucose blood, pravastatin, lisinopril, sertraline, metFORMIN, traZODone, glimepiride, omeprazole, and oxyCODONE-acetaminophen.  No orders of the defined types were placed in this encounter.   Problem List Items  Addressed This Visit      Unprioritized   Blurry vision   Relevant Orders   Ambulatory referral to Ophthalmology   Essential hypertension    Well controlled, no changes to meds. Encouraged heart healthy diet such as the DASH diet and exercise as tolerated.       Relevant Orders   Lipid panel   Hemoglobin A1c   Comprehensive metabolic panel   CBC with Differential/Platelet   Microalbumin / creatinine urine ratio   Frequent falls    F/u ortho for lumbar stenosis  Pt has seen neuro        Relevant Orders   Ambulatory referral to Orthopedic Surgery   Hyperlipidemia associated with type 2 diabetes mellitus ([place]) - Primary    Tolerating statin, encouraged heart healthy diet, avoid trans fats, minimize simple carbs and saturated fats. Increase exercise as tolerated      Relevant Orders   Lipid panel   Hemoglobin A1c   Comprehensive metabolic panel   CBC with Differential/Platelet   Hyperlipidemia LDL goal <70    Tolerating statin, encouraged heart healthy diet, avoid trans fats, minimize simple carbs and saturated fats. Increase exercise as tolerated      Leg weakness, bilateral    With frequent falls f/u ortho for lumbar stenosis       Other spondylosis with radiculopathy, cervical region    F/u ortho      Spinal stenosis of lumbar region    CLINICAL DATA:  . Bilateral leg weakness and low back pain with sciatica  EXAM: MRI LUMBAR SPINE WITHOUT CONTRAST  TECHNIQUE: Multiplanar, multisequence MR imaging of the lumbar spine was performed. No intravenous contrast was administered.  COMPARISON:  CT abdomen from 05/27/2019  FINDINGS: Segmentation: The lowest lumbar type non-rib-bearing vertebra is labeled as L5.  Alignment:  No vertebral subluxation is observed.  Vertebrae: Type 2 degenerative endplate findings at 075-GRM and to a lesser extent at L1-2 and L5-S1. Schmorl's node along the anterior inferior endplate of L1. Disc desiccation at L4-5 and L5-S1  with loss of disc height especially at L5-S1.  Conus medullaris and cauda equina: Conus extends to the L1 level. Conus and cauda equina appear normal.  Paraspinal and other soft tissues: A fluid signal intensity lesion of the left kidney is partially characterized on today's exam.  This is statistically likely to be a cyst but not technically specific.  Disc levels:  L1-2: No impingement.  Diffuse disc bulge.  L2-3: Mild displacement of the right L2 nerve in the lateral extraforaminal space due to right lateral extraforaminal disc protrusion. Diffuse disc bulge.  L3-4: No impingement.  Diffuse disc bulge.  L4-5: Mild bilateral subarticular lateral recess stenosis due to disc bulge and mild left facet arthropathy.  L5-S1: Borderline bilateral foraminal stenosis due to intervertebral and facet spurring as well as disc bulge. Prior right laminectomy.  IMPRESSION: 1. Lumbar spondylosis and degenerative disc disease, causing mild impingement at L2-3 and L4-5 as detailed above.   Electronically Signed   By: Van Clines M.D.   On: 06/11/2019 16:59   Result History  MR LUMBAR SPINE WO CONTRAST (Order [id number]) on 06/11/2019 - Order Result History Report  MR LUMBAR SPINE WO CONTRAST: Result Notes  Pieter Partridge, DO  06/13/2019 12:38 PM EDT    MRI shows degenerative disc disease in the spine causing some nerve irritation but nothing significant. Incidentally, his left kidney is partially seen, which demonstrates a probable cyst. Likely of no clinical importance but if he should have further questions, I direct him to discuss with his PCP. No further recommendations at this time.      MyChart Results Release  MyChart Status: Active Results Release  Encounter-Level Documents - 06/11/2019:  Scan on 06/11/2019 2:18 PM by Default, Provider, MD     Order-Level Documents:  There are no order-level documents. Hospital account-Level Documents:  There are  no hospital account-level documents. Orders Requiring a Screening Form  Procedure Order Status Form Status  MR LUMBAR SPINE WO CONTRAST Completed Completed  Vitals  Height Weight BMI (Calculated)  6' (1.829 m) 200 lb (90.7 kg) 27.12  Protocol Documents  Imaging Protocol  Imaging  Imaging Information  Resulted by:  Signed Date/Time  Phone Pager  Sherryl Barters 06/11/2019 4:59 PM [phone]  MR LUMBAR SPINE WO CONTRAST: Result Notes  Pieter Partridge, DO  06/13/2019 12:38 PM EDT    MRI shows degenerative disc disease in the spine causing some nerve irritation but nothing significant. Incidentally, his left kidney is partially seen, which demonstrates a probable cyst. Likely of no clinical importance but if he should have further questions, I direct him to discuss with his PCP. No further recommendations at this time.      Study Notes   Dimas Millin, RT on 06/11/2019 11:57 AM  Leg weakness, bilateral and Bilateral low back pain with bilateral sciatica NKI    Original Order  Ordered On Ordered By   06/03/2019 1:30 PM Clois Comber, CMA       External Result Report  External Result Report         Relevant Orders   Ambulatory referral to Orthopedic Surgery    Other Visit Diagnoses    Uncontrolled type 2 diabetes mellitus with hyperglycemia ([place])       Relevant Orders   Lipid panel   Hemoglobin A1c   Comprehensive metabolic panel   CBC with Differential/Platelet   POCT Urinalysis Dipstick (Automated)   Microalbumin / creatinine urine ratio   Ambulatory referral to Ophthalmology   Ambulatory referral to Podiatry      Follow-up: Return in about 6 months (around 08/06/2020) for hypertension, hyperlipidemia, diabetes II.  [name], DO

## 2020-02-08 ENCOUNTER — Encounter: Payer: Self-pay | Admitting: Orthopaedic Surgery

## 2020-02-08 ENCOUNTER — Ambulatory Visit (INDEPENDENT_AMBULATORY_CARE_PROVIDER_SITE_OTHER): Payer: Self-pay | Admitting: Orthopaedic Surgery

## 2020-02-08 ENCOUNTER — Other Ambulatory Visit: Payer: Self-pay | Admitting: Family Medicine

## 2020-02-08 ENCOUNTER — Encounter: Payer: Self-pay | Admitting: Family Medicine

## 2020-02-08 VITALS — BP 126/69 | HR 56 | Ht 72.0 in | Wt 220.0 lb

## 2020-02-08 DIAGNOSIS — M4802 Spinal stenosis, cervical region: Secondary | ICD-10-CM

## 2020-02-08 DIAGNOSIS — G5603 Carpal tunnel syndrome, bilateral upper limbs: Secondary | ICD-10-CM

## 2020-02-08 DIAGNOSIS — E1165 Type 2 diabetes mellitus with hyperglycemia: Secondary | ICD-10-CM

## 2020-02-08 DIAGNOSIS — E1169 Type 2 diabetes mellitus with other specified complication: Secondary | ICD-10-CM

## 2020-02-08 DIAGNOSIS — E785 Hyperlipidemia, unspecified: Secondary | ICD-10-CM

## 2020-02-08 NOTE — Progress Notes (Signed)
Office Visit Note   Patient: Ronald Petersen           Date of Birth: June 25, 1956           MRN: [id number] Visit Date: 02/08/2020              Requested by: [address], [place], [place] [address] [place],  [place] [address] PCP: [name], Alferd Apa, DO   Assessment & Plan: Visit Diagnoses:  1. Foraminal stenosis of cervical region   2. Carpal tunnel syndrome, bilateral     Plan: It has been almost a year since the patient's MRI scan.  He is having severe pain I recommend a repeat MRI scan cervical spine and then we can review and discuss possible surgical options for his neck as well as carpal tunnel release of one hand at the time of surgery since he has severe bilateral carpal tunnel syndrome.  The second hand could be done as an outpatient if he so desires at some point later.  Office follow-up after MRI scan.  Follow-Up Instructions: return after cervical MRI.   Orders:  Orders Placed This Encounter  Procedures  . MR Cervical Spine w/o contrast   No orders of the defined types were placed in this encounter.     Procedures: No procedures performed   Clinical Data: No additional findings.   Subjective: Chief Complaint  Patient presents with  . Neck - Pain    HPI 64 year old male returns with 73-month follow-up of persistent problems with cervical spondylosis and severe foraminal stenosis on the left at C5-6.  We previously discussed single level cervical fusion and he has been taking oxycodone for the ongoing pain as well as gabapentin and also ibuprofen.  Patient states that he had been waiting until Covid was under better control and now states the pain has been going on for so long that he does not want to wait longer and wants to consider surgery.  Previous MRI showed severe foraminal stenosis the skin was done July 2020.  Patient is a diabetic last A1c 8.3.  In November her A1c was 7.3.  Patient's pain he rates at moderate sometimes and severe.   Has pain in his neck radiates into his shoulders more on the left than right and down his left arm to the radial side of his left hand.  No lower extremity gait problems.  He does have peripheral neuropathy but no plantar foot lesions.  Review of Systems 14 point systems positive for diabetes previous right rotator cuff repair, hyperlipidemia, peripheral neuropathy fatigue, depression, sleep apnea.  History of GE GE reflux.  Positive cervical spondylosis C5-6.  Previous nerve conduction velocities consistent with severe bilateral carpal tunnel syndrome.   Objective: Vital Signs: BP 126/69   Pulse (!) 56   Ht 6' (1.829 m)   Wt 220 lb (99.8 kg)   BMI 29.84 kg/m   Physical Exam Constitutional:      Appearance: He is well-developed.  HENT:     Head: Normocephalic and atraumatic.  Eyes:     Pupils: Pupils are equal, round, and reactive to light.  Neck:     Thyroid: No thyromegaly.     Trachea: No tracheal deviation.  Cardiovascular:     Rate and Rhythm: Normal rate.  Pulmonary:     Effort: Pulmonary effort is normal.     Breath sounds: No wheezing.  Abdominal:     General: Bowel sounds are normal.  Palpations: Abdomen is soft.  Skin:    General: Skin is warm and dry.     Capillary Refill: Capillary refill takes less than 2 seconds.  Neurological:     Mental Status: He is alert and oriented to person, place, and time.  Psychiatric:        Behavior: Behavior normal.        Thought Content: Thought content normal.        Judgment: Judgment normal.     Ortho Exam patient has positive Spurling on the left negative on the right.  Left brachial plexus tenderness.  Upper extremity reflexes are 2+ and symmetrical.  Patient has positive Phalen's test at the wrist both right and left.  Normal heel toe gait no lower extremity clonus negative Babinski.  Knee and ankle jerk are 2+ and symmetrical.  Normal hip range of motion. Specialty Comments:  No specialty comments  available.  Imaging: No results found.   PMFS History: Patient Active Problem List   Diagnosis Date Noted  . Blurry vision 02/07/2020  . Spinal stenosis of lumbar region 02/07/2020  . Frequent falls 02/07/2020  . Foraminal stenosis of cervical region 07/26/2019  . Aortic atherosclerosis ([place]) 05/27/2019  . Chronic bilateral low back pain without sciatica 05/20/2019  . Carpal tunnel syndrome, bilateral 05/20/2019  . Other spondylosis with radiculopathy, cervical region 05/20/2019  . Leg weakness, bilateral 01/05/2019  . Falling episodes 01/05/2019  . Right rotator cuff tear 11/23/2018  . Neck pain 11/04/2018  . Acute pain of right shoulder 11/04/2018  . Gastroesophageal reflux disease 11/04/2018  . Hyperlipidemia associated with type 2 diabetes mellitus ([place]) 11/04/2018  . 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 Essential hypertension 09/14/2017  . Preventative health care 06/12/2016  . OSA (obstructive sleep apnea) 04/07/2016  . Joint pain 04/05/2016  . NSAID long-term use 12/13/2015  . Nausea without vomiting 12/13/2015  . History of colonic polyps 12/13/2015  . Loss of weight 12/13/2015  . Depression 11/06/2015  . Metatarsal deformity 02/20/2015  . Equinus deformity of foot, acquired 02/20/2015  . Plantar fasciitis, bilateral 02/15/2015  . Left wrist injury 06/15/2014  . Peripheral neuropathy 04/01/2012  . FATIGUE 11/05/2010  . OTHER SPECIFIED DISORDER OF PENIS 03/26/2010  . NAUSEA 03/26/2010  . Pain in limb 10/17/2008  . NECK PAIN, CHRONIC 11/24/2007  . DM (diabetes mellitus) type II uncontrolled, periph vascular disorder ([place]) 01/27/2007  . Hyperlipidemia LDL goal <70 01/27/2007   Past Medical History:  Diagnosis Date  . Anxiety   . Arthritis   . Cancer ([place])   . Complication of anesthesia    aspirated with back surgery at age 3  . Depression   . Diabetes mellitus   . GERD (gastroesophageal reflux disease)   .  Hyperlipidemia   . Hypertension   . Neuromuscular disorder ([place])    NEUROPATHY  . Right rotator cuff tear 11/23/2018  . Sleep apnea    no CPAP    Family History  Problem Relation Age of Onset  . Ovarian cancer Sister   . Stomach cancer Sister   . Heart disease Sister        MI  . Heart disease Sister        MI  . Alcohol abuse Other   . Depression Other   . Arthritis Other   . Hypertension Other   . Stomach cancer Maternal Grandmother   . COPD Mother   . Stroke Father   .  Coronary artery disease Other   . Ovarian cancer Other        neice  . Ovarian cancer Other        neice  . Colon cancer Other     Past Surgical History:  Procedure Laterality Date  . APPENDECTOMY    . ARTHOSCOPIC ROTAOR CUFF REPAIR Right 11/23/2018   Procedure: ARTHROSCOPIC ROTATOR CUFF REPAIR;  Surgeon: [name], MD;  Location: [facility];  Service: Orthopedics;  Laterality: Right;  . CHOLECYSTECTOMY    . LUMBAR LAMINECTOMY    . SHOULDER ARTHROSCOPY WITH ROTATOR CUFF REPAIR AND SUBACROMIAL DECOMPRESSION Right 11/23/2018   Procedure: SHOULDER ARTHROSCOPY WITH ROTATOR CUFF REPAIR AND SUBACROMIAL DECOMPRESSION;  Surgeon: [name], MD;  Location: [facility];  Service: Orthopedics;  Laterality: Right;   Social History   Occupational History  . Occupation: self employed    Fish farm manager: UNEMPLOYED  Tobacco Use  . Smoking status: Former Smoker    Years: 16.00    Types: Cigarettes    Quit date: 12/29/1985    Years since quitting: 34.1  . Smokeless tobacco: Never Used  Substance and Sexual Activity  . Alcohol use: Yes    Alcohol/week: 4.0 - 5.0 standard drinks    Types: 4 - 5 Cans of beer per week    Comment: 5 times per week  . Drug use: No  . Sexual activity: Yes    Partners: Female

## 2020-02-09 ENCOUNTER — Other Ambulatory Visit: Payer: Self-pay | Admitting: Family Medicine

## 2020-02-09 ENCOUNTER — Telehealth: Payer: Self-pay

## 2020-02-09 ENCOUNTER — Other Ambulatory Visit: Payer: Self-pay

## 2020-02-09 DIAGNOSIS — R110 Nausea: Secondary | ICD-10-CM

## 2020-02-09 MED ORDER — ROSUVASTATIN CALCIUM 20 MG PO TABS: 20.0000 mg | ORAL_TABLET | Freq: Every day | ORAL | 2 refills | Status: DC

## 2020-02-09 MED ORDER — JANUMET XR 50-1000 MG PO TB24: 2.0000 | ORAL_TABLET | Freq: Every day | ORAL | 2 refills | Status: DC

## 2020-02-09 MED FILL — JANUMET XR 50-1,000 MG TAB: 50-1000 | 30 days supply | Qty: 60 | Fill #0

## 2020-02-09 MED FILL — ROSUVASTATIN CALCIUM 20 MG: 20 | 30 days supply | Qty: 30 | Fill #0

## 2020-02-09 NOTE — Telephone Encounter (Signed)
See labs 

## 2020-02-09 NOTE — Telephone Encounter (Signed)
PA faxed to [facility] at [phone]. Awaiting determination.

## 2020-02-10 ENCOUNTER — Telehealth: Payer: Self-pay | Admitting: Orthopaedic Surgery

## 2020-02-10 MED ORDER — ONDANSETRON HCL 4 MG PO TABS: 4.0000 mg | ORAL_TABLET | Freq: Three times a day (TID) | ORAL | 2 refills | Status: DC | PRN

## 2020-02-10 MED FILL — ONDANSETRON HCL 4 MG TABLET: 4 | 7 days supply | Qty: 20 | Fill #0

## 2020-02-10 NOTE — Telephone Encounter (Signed)
Called patient left voicemail message to return call to schedule an appointment with Dr. Lorin Mercy for MRI review   MRI is scheduled for 02/11/2020

## 2020-02-11 ENCOUNTER — Ambulatory Visit (HOSPITAL_BASED_OUTPATIENT_CLINIC_OR_DEPARTMENT_OTHER): Admission: RE | Admit: 2020-02-11 | Payer: Medicaid Other | Source: Ambulatory Visit

## 2020-02-14 NOTE — Telephone Encounter (Signed)
Meds sent in last week.

## 2020-02-15 ENCOUNTER — Ambulatory Visit: Payer: Medicaid Other | Admitting: Orthopaedic Surgery

## 2020-02-16 NOTE — Telephone Encounter (Signed)
Called [facility] today-- Pt has family planning only. Will inform pharmacy when they open.

## 2020-02-19 ENCOUNTER — Other Ambulatory Visit: Payer: Self-pay | Admitting: Family Medicine

## 2020-02-19 DIAGNOSIS — G8929 Other chronic pain: Secondary | ICD-10-CM

## 2020-02-20 MED ORDER — OXYCODONE-ACETAMINOPHEN 5-325 MG PO TABS: 1.0000 | ORAL_TABLET | Freq: Four times a day (QID) | ORAL | 0 refills | Status: DC | PRN

## 2020-02-20 MED FILL — OXYCODONE-ACETAMINOPHEN 5-3: 5-325 | 23 days supply | Qty: 90 | Fill #0

## 2020-02-22 ENCOUNTER — Other Ambulatory Visit: Payer: Self-pay | Admitting: Family Medicine

## 2020-02-22 DIAGNOSIS — G8929 Other chronic pain: Secondary | ICD-10-CM

## 2020-02-22 MED FILL — GABAPENTIN 600 MG TABLET: 600 | 30 days supply | Qty: 90 | Fill #0

## 2020-02-25 ENCOUNTER — Ambulatory Visit (HOSPITAL_BASED_OUTPATIENT_CLINIC_OR_DEPARTMENT_OTHER): Payer: Self-pay

## 2020-03-08 ENCOUNTER — Ambulatory Visit: Payer: Medicaid Other | Admitting: Family Medicine

## 2020-03-08 ENCOUNTER — Other Ambulatory Visit: Payer: Self-pay

## 2020-03-08 DIAGNOSIS — M791 Myalgia, unspecified site: Secondary | ICD-10-CM

## 2020-03-08 NOTE — Progress Notes (Signed)
Virtual Visit via Video Note  I connected with Amante Kosek Hoffmeier on 03/08/20 at  3:00 PM EST by a video enabled telemedicine application and verified that I am speaking with the correct person using two identifiers.  Location: Patient: walked in office  Provider: office    I discussed the limitations of evaluation and management by telemedicine and the availability of in person appointments. The patient expressed understanding and agreed to proceed.  History of Present Illness:   pt never seen He thought ov was in person--- he had bodyaches, headaches and blurry vision Pt was asked to go to car for virtual  He was called 3 x --  2 texts and 1 phone call and never picked up Office staff called several times as well  Observations/Objective:   Assessment and Plan:   Follow Up Instructions:    I discussed the assessment and treatment plan with the patient. The patient was provided an opportunity to ask questions and all were answered. The patient agreed with the plan and demonstrated an understanding of the instructions.   The patient was advised to call back or seek an in-person evaluation if the symptoms worsen or if the condition fails to improve as anticipated.  I provided - minutes of non-face-to-face time during this encounter.   [name], DO

## 2020-03-20 ENCOUNTER — Other Ambulatory Visit: Payer: Self-pay | Admitting: Family Medicine

## 2020-03-20 DIAGNOSIS — G8929 Other chronic pain: Secondary | ICD-10-CM

## 2020-03-21 MED FILL — OXYCODONE-ACETAMINOPHEN 5-3: 5-325 | 23 days supply | Qty: 90 | Fill #0

## 2020-03-21 NOTE — Telephone Encounter (Signed)
Last written: 02/20/20 Last ov: 03/08/20 Next ov: 05/15/20 Contract: 08/03/18 UDS: 11/14/19

## 2020-03-29 ENCOUNTER — Other Ambulatory Visit: Payer: Self-pay | Admitting: Family Medicine

## 2020-03-29 DIAGNOSIS — E1165 Type 2 diabetes mellitus with hyperglycemia: Secondary | ICD-10-CM

## 2020-03-29 DIAGNOSIS — I10 Essential (primary) hypertension: Secondary | ICD-10-CM

## 2020-03-29 MED FILL — tiZANidine HCL 4 MG TABS: 4 | 8 days supply | Qty: 30 | Fill #1

## 2020-03-29 MED FILL — OMEPRAZOLE 40 MG CPDR: 40 | 90 days supply | Qty: 90 | Fill #0

## 2020-03-29 MED FILL — GABAPENTIN 600 MG TABLET: 600 | 30 days supply | Qty: 90 | Fill #1

## 2020-04-02 MED FILL — LISINOPRIL 5 MG TABLET: 5 | 90 days supply | Qty: 90 | Fill #0

## 2020-04-09 ENCOUNTER — Encounter: Payer: Self-pay | Admitting: Family Medicine

## 2020-04-09 ENCOUNTER — Telehealth (INDEPENDENT_AMBULATORY_CARE_PROVIDER_SITE_OTHER): Payer: Medicaid Other | Admitting: Family Medicine

## 2020-04-09 DIAGNOSIS — J029 Acute pharyngitis, unspecified: Secondary | ICD-10-CM

## 2020-04-09 MED ORDER — AMOXICILLIN 875 MG PO TABS: 875.0000 mg | ORAL_TABLET | Freq: Two times a day (BID) | ORAL | 0 refills | Status: DC

## 2020-04-09 MED FILL — AMOXICILLIN 875 MG TABS: 875 | 10 days supply | Qty: 20 | Fill #0

## 2020-04-09 NOTE — Progress Notes (Signed)
Virtual Visit via Video Note  I connected with Ronald Petersen on 04/09/20 at  4:00 PM EDT by a video enabled telemedicine application and verified that I am speaking with the correct person using two identifiers.  Location: Patient: home alone  Provider: office    I discussed the limitations of evaluation and management by telemedicine and the availability of in person appointments. The patient expressed understanding and agreed to proceed.  History of Present Illness: Pt is home c/o sore throat and headache x 1 week  no cough, no pnd, No fever  Observations/Objective: There were no vitals filed for this visit. No fever   Assessment and Plan: 1. Pharyngitis, unspecified etiology Gargle with salt water Tylenol prn  abx per orders  - amoxicillin (AMOXIL) 875 MG tablet; Take 1 tablet (875 mg total) by mouth 2 (two) times daily.  Dispense: 20 tablet; Refill: 0  Gargle with salt water  Take abx and get covid test if no improvement   Follow Up Instructions:    I discussed the assessment and treatment plan with the patient. The patient was provided an opportunity to ask questions and all were answered. The patient agreed with the plan and demonstrated an understanding of the instructions.   The patient was advised to call back or seek an in-person evaluation if the symptoms worsen or if the condition fails to improve as anticipated.  I provided 25 minutes of non-face-to-face time during this encounter.   [name], DO

## 2020-04-16 ENCOUNTER — Other Ambulatory Visit: Payer: Self-pay | Admitting: Family Medicine

## 2020-04-16 DIAGNOSIS — E1165 Type 2 diabetes mellitus with hyperglycemia: Secondary | ICD-10-CM

## 2020-04-17 ENCOUNTER — Other Ambulatory Visit: Payer: Self-pay | Admitting: Family Medicine

## 2020-04-17 DIAGNOSIS — G8929 Other chronic pain: Secondary | ICD-10-CM

## 2020-04-17 MED FILL — OXYCODONE-ACETAMINOPHEN 5-3: 5-325 | 23 days supply | Qty: 90 | Fill #0

## 2020-04-17 NOTE — Telephone Encounter (Signed)
Requesting: percocet  Contract:11/17/19 UDS:11/14/19 Last Visit:04/09/20 Next Visit:05/15/20 Last Refill:03/21/20  Please Advise

## 2020-05-15 ENCOUNTER — Ambulatory Visit: Payer: Medicaid Other | Admitting: Family Medicine

## 2020-05-17 ENCOUNTER — Other Ambulatory Visit: Payer: Self-pay

## 2020-05-17 ENCOUNTER — Other Ambulatory Visit: Payer: Self-pay | Admitting: Family Medicine

## 2020-05-17 DIAGNOSIS — G8929 Other chronic pain: Secondary | ICD-10-CM

## 2020-05-17 MED ORDER — OXYCODONE-ACETAMINOPHEN 5-325 MG PO TABS: 1.0000 | ORAL_TABLET | Freq: Four times a day (QID) | ORAL | 0 refills | Status: DC | PRN

## 2020-05-17 MED FILL — GABAPENTIN 600 MG TABLET: 600 | 30 days supply | Qty: 90 | Fill #2

## 2020-05-17 MED FILL — OXYCODONE-ACETAMINOPHEN 5-3: 5-325 | 23 days supply | Qty: 90 | Fill #0

## 2020-05-17 MED FILL — SERTRALINE HCL 100 MG TABS: 100 | 90 days supply | Qty: 90 | Fill #3

## 2020-05-17 NOTE — Telephone Encounter (Signed)
RF request for Metformin LOV:03/08/20, attempted VV, last f/u 02/09/20 Next ov: 06/12/20 Last written:n/a  Requesting:oxycodone Contract:11/14/19 UDS:11/14/19 Last Visit:03/08/20, attempted VV Next Visit:06/12/20 Last Refill:04/17/20(90,0)  Please Advise. Pt states he could not afford [name] prescribed 02/09/20 #60 w/ 2 RF. Glimepiride not on current med list

## 2020-05-17 NOTE — Telephone Encounter (Signed)
Caller: Adis Call back number: [phone] Patient states he has not had a refill in a long time. Patient states he has been taking his wife medication.  Medication: Glimepiride 2mg  Metformin 500mg  oxyCODONE-acetaminophen (PERCOCET/ROXICET) 5-325 MG tablet   Has the patient contacted their pharmacy? yes (If no, request that the patient contact the pharmacy for the refill.) (If yes, when and what did the pharmacy advise?) contact doctors office     Preferred Pharmacy (with phone number or street name): [facility] [address], [place] [address]  Phone:  [phone] Fax:  [phone]     Agent: Please be advised that RX refills may take up to 3 business days. We ask that you follow-up with your pharmacy.

## 2020-05-31 ENCOUNTER — Encounter: Payer: Self-pay | Admitting: Family Medicine

## 2020-05-31 ENCOUNTER — Other Ambulatory Visit: Payer: Self-pay

## 2020-05-31 ENCOUNTER — Other Ambulatory Visit: Payer: Self-pay | Admitting: Family Medicine

## 2020-05-31 ENCOUNTER — Ambulatory Visit (INDEPENDENT_AMBULATORY_CARE_PROVIDER_SITE_OTHER): Payer: Medicare HMO | Admitting: Family Medicine

## 2020-05-31 VITALS — BP 120/70 | HR 57 | Temp 97.8°F | Resp 18 | Ht 72.0 in | Wt 219.0 lb

## 2020-05-31 DIAGNOSIS — E785 Hyperlipidemia, unspecified: Secondary | ICD-10-CM

## 2020-05-31 DIAGNOSIS — E1169 Type 2 diabetes mellitus with other specified complication: Secondary | ICD-10-CM | POA: Diagnosis not present

## 2020-05-31 DIAGNOSIS — R110 Nausea: Secondary | ICD-10-CM

## 2020-05-31 DIAGNOSIS — I10 Essential (primary) hypertension: Secondary | ICD-10-CM | POA: Diagnosis not present

## 2020-05-31 DIAGNOSIS — E1165 Type 2 diabetes mellitus with hyperglycemia: Secondary | ICD-10-CM | POA: Diagnosis not present

## 2020-05-31 LAB — COMPREHENSIVE METABOLIC PANEL
ALT: 22 U/L (ref 0–53)
AST: 14 U/L (ref 0–37)
Albumin: 4.7 g/dL (ref 3.5–5.2)
Alkaline Phosphatase: 73 U/L (ref 39–117)
BUN: 24 mg/dL — ABNORMAL HIGH (ref 6–23)
CO2: 29 mEq/L (ref 19–32)
Calcium: 9.7 mg/dL (ref 8.4–10.5)
Chloride: 99 mEq/L (ref 96–112)
Creatinine, Ser: 0.77 mg/dL (ref 0.40–1.50)
GFR: 101.42 mL/min (ref >60.00)
Glucose, Bld: 257 mg/dL — ABNORMAL HIGH (ref 70–99)
Potassium: 4 mEq/L (ref 3.5–5.1)
Sodium: 135 mEq/L (ref 135–145)
Total Bilirubin: 0.7 mg/dL (ref 0.2–1.2)
Total Protein: 7 g/dL (ref 6.0–8.3)

## 2020-05-31 LAB — LIPID PANEL
Cholesterol: 171 mg/dL (ref 0–200)
HDL: 29.8 mg/dL — ABNORMAL LOW (ref >39.00)
NonHDL: 141.69
Total CHOL/HDL Ratio: 6
Triglycerides: 268 mg/dL — ABNORMAL HIGH (ref 0.0–149.0)
VLDL: 53.6 mg/dL — ABNORMAL HIGH (ref 0.0–40.0)

## 2020-05-31 LAB — MICROALBUMIN / CREATININE URINE RATIO
Creatinine,U: 91.3 mg/dL
Microalb Creat Ratio: 1.9 mg/g (ref 0.0–30.0)
Microalb, Ur: 1.7 mg/dL (ref 0.0–1.9)

## 2020-05-31 LAB — LDL CHOLESTEROL, DIRECT: Direct LDL: 93 mg/dL

## 2020-05-31 LAB — HEMOGLOBIN A1C: Hgb A1c MFr Bld: 9.3 % — ABNORMAL HIGH (ref 4.6–6.5)

## 2020-05-31 MED ORDER — METFORMIN HCL ER 500 MG PO TB24: 500.0000 mg | ORAL_TABLET | Freq: Every day | ORAL | 2 refills | Status: DC

## 2020-05-31 MED ORDER — ONDANSETRON HCL 4 MG PO TABS: 4.0000 mg | ORAL_TABLET | Freq: Three times a day (TID) | ORAL | 2 refills | Status: DC | PRN

## 2020-05-31 MED ORDER — GLIMEPIRIDE 2 MG PO TABS: 2.0000 mg | ORAL_TABLET | Freq: Every day | ORAL | 3 refills | Status: DC

## 2020-05-31 MED FILL — metFORMIN HCL ER 500 MG TB2: 500 | 60 days supply | Qty: 60 | Fill #0

## 2020-05-31 MED FILL — ONDANSETRON HCL 4 MG TABLET: 4 | 7 days supply | Qty: 20 | Fill #0

## 2020-05-31 MED FILL — GLIMEPIRIDE 2 MG TABLET: 2 | 30 days supply | Qty: 30 | Fill #0

## 2020-05-31 NOTE — Assessment & Plan Note (Signed)
Well controlled, no changes to meds. Encouraged heart healthy diet such as the DASH diet and exercise as tolerated.  °

## 2020-05-31 NOTE — Progress Notes (Signed)
Patient ID: Ronald Petersen, male    DOB: Dec 06, 1956  Age: 64 y.o. MRN: [id number]    Subjective:  Subjective  HPI Ronald Petersen presents for dm, chol and bp HYPERTENSION   Blood pressure range-not checking   Chest pain- no      Dyspnea- no Lightheadedness- no   Edema- no  Other side effects - no   Medication compliance: good  Low salt diet- no    DIABETES    Blood Sugar ranges-high--- 300-400  Polyuria- no New Visual problems- no  Hypoglycemic symptoms- no  Other side effects-no Medication compliance - good Last eye exam- due Foot exam- today   HYPERLIPIDEMIA  Medication compliance- good  RUQ pain- no  Muscle aches- no Other side effects-no       Review of Systems  Constitutional: Negative for appetite change, diaphoresis, fatigue and unexpected weight change.  Eyes: Negative for pain, redness and visual disturbance.  Respiratory: Negative for cough, chest tightness, shortness of breath and wheezing.   Cardiovascular: Negative for chest pain, palpitations and leg swelling.  Endocrine: Negative for cold intolerance, heat intolerance, polydipsia, polyphagia and polyuria.  Genitourinary: Negative for difficulty urinating, dysuria and frequency.  Neurological: Negative for dizziness, light-headedness, numbness and headaches.    History Past Medical History:  Diagnosis Date  . Anxiety   . Arthritis   . Cancer ([place])   . Complication of anesthesia    aspirated with back surgery at age 81  . Depression   . Diabetes mellitus   . GERD (gastroesophageal reflux disease)   . Hyperlipidemia   . Hypertension   . Neuromuscular disorder ([place])    NEUROPATHY  . Right rotator cuff tear 11/23/2018  . Sleep apnea    no CPAP    He has a past surgical history that includes Appendectomy; Cholecystectomy; Lumbar laminectomy; Shoulder arthroscopy with rotator cuff repair and subacromial decompression (Right, 11/23/2018); and Arthroscopic rotator cuff repair (Right,  11/23/2018).   His family history includes Alcohol abuse in an other family member; Arthritis in an other family member; COPD in his mother; Colon cancer in an other family member; Coronary artery disease in an other family member; Depression in an other family member; Heart disease in his sister and sister; Hypertension in an other family member; Ovarian cancer in his sister and other family members; Stomach cancer in his maternal grandmother and sister; Stroke in his father.He reports that he quit smoking about 34 years ago. His smoking use included cigarettes. He quit after 16.00 years of use. He has never used smokeless tobacco. He reports current alcohol use of about 4.0 - 5.0 standard drinks of alcohol per week. He reports that he does not use drugs.  Current Outpatient Medications on File Prior to Visit  Medication Sig Dispense Refill  . BAYER CONTOUR TEST test strip Check Blood sugar once daily. Dx E11.9 50 each 12  . BAYER MICROLET LANCETS lancets Check Blood sugar once daily. Dx E11.9 50 each 11  . gabapentin (NEURONTIN) 600 MG tablet TAKE 1 TABLET (600 MG TOTAL) BY MOUTH 3 (THREE) TIMES DAILY. 90 tablet 5  . glucose blood test strip Use as instructed 100 each 12  . lisinopril (ZESTRIL) 5 MG tablet TAKE 1 TABLET (5 MG TOTAL) BY MOUTH DAILY. 90 tablet 1  . omeprazole (PRILOSEC) 40 MG capsule TAKE 1 CAPSULE (40 MG TOTAL) BY MOUTH DAILY. 90 capsule 1  . oxyCODONE-acetaminophen (PERCOCET/ROXICET) 5-325 MG tablet Take 1 tablet by mouth every 6 (six) hours as needed.  90 tablet 0  . rosuvastatin (CRESTOR) 20 MG tablet Take 1 tablet (20 mg total) by mouth daily. 30 tablet 2  . sennosides-docusate sodium (SENOKOT-S) 8.6-50 MG tablet Take 2 tablets by mouth daily. 30 tablet 1  . sertraline (ZOLOFT) 100 MG tablet Take 1 tablet (100 mg total) by mouth daily. 180 tablet 1  . tiZANidine (ZANAFLEX) 4 MG tablet TAKE 1 TABLET (4 MG TOTAL) BY MOUTH EVERY 6 (SIX) HOURS AS NEEDED FOR MUSCLE SPASMS. 30 tablet 1   . traZODone (DESYREL) 50 MG tablet Take 0.5-1 tablets (25-50 mg total) by mouth at bedtime as needed for sleep. 30 tablet 3   No current facility-administered medications on file prior to visit.     Objective:  Objective  Physical Exam Vitals and nursing note reviewed.  Constitutional:      General: He is sleeping.     Appearance: He is well-developed.  HENT:     Head: Normocephalic and atraumatic.  Eyes:     Pupils: Pupils are equal, round, and reactive to light.  Neck:     Thyroid: No thyromegaly.  Cardiovascular:     Rate and Rhythm: Normal rate and regular rhythm.     Heart sounds: No murmur.  Pulmonary:     Effort: Pulmonary effort is normal. No respiratory distress.     Breath sounds: Normal breath sounds. No wheezing or rales.  Chest:     Chest wall: No tenderness.  Musculoskeletal:        General: No tenderness.     Cervical back: Normal range of motion and neck supple.  Skin:    General: Skin is warm and dry.  Neurological:     Mental Status: He is oriented to person, place, and time.  Psychiatric:        Behavior: Behavior normal.        Thought Content: Thought content normal.        Judgment: Judgment normal.    Diabetic Foot Exam - Simple   Simple Foot Form Diabetic Foot exam was performed with the following findings: Yes 05/31/2020  9:07 AM  Visual Inspection No deformities, no ulcerations, no other skin breakdown bilaterally: Yes Sensation Testing Intact to touch and monofilament testing bilaterally: Yes Pulse Check Posterior Tibialis and Dorsalis pulse intact bilaterally: Yes Comments     BP 120/70 (BP Location: Right Arm, Patient Position: Sitting, Cuff Size: Normal)   Pulse (!) 57   Temp 97.8 F (36.6 C) (Temporal)   Resp 18   Ht 6' (1.829 m)   Wt 219 lb (99.3 kg)   SpO2 97%   BMI 29.70 kg/m  Wt Readings from Last 3 Encounters:  05/31/20 219 lb (99.3 kg)  02/08/20 220 lb (99.8 kg)  02/07/20 223 lb 9.6 oz (101.4 kg)     Lab Results   Component Value Date   WBC 6.9 02/07/2020   HGB 14.8 02/07/2020   HCT 44.0 02/07/2020   PLT 193.0 02/07/2020   GLUCOSE 142 (H) 02/07/2020   CHOL 183 02/07/2020   TRIG 187.0 (H) 02/07/2020   HDL 32.10 (L) 02/07/2020   LDLDIRECT 119.0 11/14/2019   LDLCALC 113 (H) 02/07/2020   ALT 32 02/07/2020   AST 20 02/07/2020   NA 136 02/07/2020   K 4.4 02/07/2020   CL 102 02/07/2020   CREATININE 0.82 02/07/2020   BUN 14 02/07/2020   CO2 28 02/07/2020   TSH 0.97 10/25/2012   PSA 1.38 02/09/2019   HGBA1C 8.3 (H) 02/07/2020  MICROALBUR 2.1 (H) 02/07/2020    MR Cervical Spine w/o contrast  Result Date: 07/24/2019 CLINICAL DATA:  Neck pain.  Right-greater-than-left symptoms. EXAM: MRI CERVICAL SPINE WITHOUT CONTRAST TECHNIQUE: Multiplanar, multisequence MR imaging of the cervical spine was performed. No intravenous contrast was administered. COMPARISON:  Cervical spine MRI 11/10/2015 FINDINGS: Alignment: Physiologic. Vertebrae: No fracture, evidence of discitis, or bone lesion. Cord: Normal signal and morphology. Posterior Fossa, vertebral arteries, paraspinal tissues: Visualized posterior fossa is normal. Vertebral artery flow voids are preserved. No prevertebral soft tissue swelling. Disc levels: C2-3: Normal. C3-4: Left subarticular disc protrusion, unchanged. No spinal canal or neural foraminal stenosis. C4-5: Normal. C5-6: Unchanged intermediate sized left subarticular disc osteophyte complex. Severe left neural foraminal stenosis. No spinal canal or right neural foraminal stenosis. C6-7: Small disc bulge without spinal canal or neural foraminal stenosis. C7-T1: No spinal canal or neural foraminal stenosis. IMPRESSION: Unchanged C5-6 left subarticular disc osteophyte complex with severe left neural foraminal stenosis. Electronically Signed   By: Ulyses Jarred M.D.   On: 07/24/2019 00:47     Assessment & Plan:  Plan  I have discontinued Kendarrius D. Carnegie "Dale"'s amoxicillin. I am also having him  start on metFORMIN and glimepiride. Additionally, I am having him maintain his [employer] Lancets, sennosides-docusate sodium, tiZANidine, glucose blood, sertraline, traZODone, omeprazole, rosuvastatin, gabapentin, lisinopril, oxyCODONE-acetaminophen, and ondansetron.  Meds ordered this encounter  Medications  . metFORMIN ([name]) 500 MG 24 hr tablet    Sig: Take 1 tablet (500 mg total) by mouth daily with breakfast.    Dispense:  60 tablet    Refill:  2  . glimepiride ([name]) 2 MG tablet    Sig: Take 1 tablet (2 mg total) by mouth daily before breakfast.    Dispense:  30 tablet    Refill:  3  . ondansetron (ZOFRAN) 4 MG tablet    Sig: Take 1 tablet (4 mg total) by mouth every 8 (eight) hours as needed.    Dispense:  20 tablet    Refill:  2    Problem List Items Addressed This Visit      Unprioritized   Essential hypertension    Well controlled, no changes to meds. Encouraged heart healthy diet such as the DASH diet and exercise as tolerated.        Relevant Medications   metFORMIN ([name]) 500 MG 24 hr tablet   glimepiride ([name]) 2 MG tablet   Other Relevant Orders   Comprehensive metabolic panel   Microalbumin / creatinine urine ratio   Hyperlipidemia associated with type 2 diabetes mellitus ([place])    Tolerating statin, encouraged heart healthy diet, avoid trans fats, minimize simple carbs and saturated fats. Increase exercise as tolerated      Relevant Medications   metFORMIN ([name]) 500 MG 24 hr tablet   glimepiride ([name]) 2 MG tablet   Other Relevant Orders   Lipid panel   Comprehensive metabolic panel   Uncontrolled type 2 diabetes mellitus with hyperglycemia ([place]) - Primary    hgba1c to be checked, minimize simple carbs. Increase exercise as tolerated. Continue current meds bs running high       Relevant Medications   metFORMIN ([name]) 500 MG 24 hr tablet   glimepiride ([name]) 2 MG tablet   Other  Relevant Orders   Ambulatory referral to Endocrinology   Lipid panel   Hemoglobin A1c   Comprehensive metabolic panel   Microalbumin / creatinine urine ratio    Other  Visit Diagnoses    Nausea       Relevant Medications   ondansetron (ZOFRAN) 4 MG tablet      Follow-up: Return in about 3 months (around 08/31/2020), or if symptoms worsen or fail to improve, for hypertension, hyperlipidemia, diabetes II--- schedule endo app with shamleffer .  [name], DO

## 2020-05-31 NOTE — Assessment & Plan Note (Signed)
hgba1c to be checked, minimize simple carbs. Increase exercise as tolerated. Continue current meds bs running high

## 2020-05-31 NOTE — Assessment & Plan Note (Signed)
Tolerating statin, encouraged heart healthy diet, avoid trans fats, minimize simple carbs and saturated fats. Increase exercise as tolerated 

## 2020-05-31 NOTE — Patient Instructions (Signed)
Carbohydrate Counting for Diabetes Mellitus, Adult  Carbohydrate counting is a method of keeping track of how many carbohydrates you eat. Eating carbohydrates naturally increases the amount of sugar (glucose) in the blood. Counting how many carbohydrates you eat helps keep your blood glucose within normal limits, which helps you manage your diabetes (diabetes mellitus). It is important to know how many carbohydrates you can safely have in each meal. This is different for every person. A diet and nutrition specialist (registered dietitian) can help you make a meal plan and calculate how many carbohydrates you should have at each meal and snack. Carbohydrates are found in the following foods:  Grains, such as breads and cereals.  Dried beans and soy products.  Starchy vegetables, such as potatoes, peas, and corn.  Fruit and fruit juices.  Milk and yogurt.  Sweets and snack foods, such as cake, cookies, candy, chips, and soft drinks. How do I count carbohydrates? There are two ways to count carbohydrates in food. You can use either of the methods or a combination of both. Reading "Nutrition Facts" on packaged food The "Nutrition Facts" list is included on the labels of almost all packaged foods and beverages in the [place] It includes:  The serving size.  Information about nutrients in each serving, including the grams (g) of carbohydrate per serving. To use the "Nutrition Facts":  Decide how many servings you will have.  Multiply the number of servings by the number of carbohydrates per serving.  The resulting number is the total amount of carbohydrates that you will be having. Learning standard serving sizes of other foods When you eat carbohydrate foods that are not packaged or do not include "Nutrition Facts" on the label, you need to measure the servings in order to count the amount of carbohydrates:  Measure the foods that you will eat with a food scale or measuring cup, if  needed.  Decide how many standard-size servings you will eat.  Multiply the number of servings by 15. Most carbohydrate-rich foods have about 15 g of carbohydrates per serving. ? For example, if you eat 8 oz (170 g) of strawberries, you will have eaten 2 servings and 30 g of carbohydrates (2 servings x 15 g = 30 g).  For foods that have more than one food mixed, such as soups and casseroles, you must count the carbohydrates in each food that is included. The following list contains standard serving sizes of common carbohydrate-rich foods. Each of these servings has about 15 g of carbohydrates:   hamburger bun or  English muffin.   oz (15 mL) syrup.   oz (14 g) jelly.  1 slice of bread.  1 six-inch tortilla.  3 oz (85 g) cooked rice or pasta.  4 oz (113 g) cooked dried beans.  4 oz (113 g) starchy vegetable, such as peas, corn, or potatoes.  4 oz (113 g) hot cereal.  4 oz (113 g) mashed potatoes or  of a large baked potato.  4 oz (113 g) canned or frozen fruit.  4 oz (120 mL) fruit juice.  4-6 crackers.  6 chicken nuggets.  6 oz (170 g) unsweetened dry cereal.  6 oz (170 g) plain fat-free yogurt or yogurt sweetened with artificial sweeteners.  8 oz (240 mL) milk.  8 oz (170 g) fresh fruit or one small piece of fruit.  24 oz (680 g) popped popcorn. Example of carbohydrate counting Sample meal  3 oz (85 g) chicken breast.  6 oz (170 g)   brown rice.  4 oz (113 g) corn.  8 oz (240 mL) milk.  8 oz (170 g) strawberries with sugar-free whipped topping. Carbohydrate calculation 1. Identify the foods that contain carbohydrates: ? Rice. ? Corn. ? Milk. ? Strawberries. 2. Calculate how many servings you have of each food: ? 2 servings rice. ? 1 serving corn. ? 1 serving milk. ? 1 serving strawberries. 3. Multiply each number of servings by 15 g: ? 2 servings rice x 15 g = 30 g. ? 1 serving corn x 15 g = 15 g. ? 1 serving milk x 15 g = 15 g. ? 1  serving strawberries x 15 g = 15 g. 4. Add together all of the amounts to find the total grams of carbohydrates eaten: ? 30 g + 15 g + 15 g + 15 g = 75 g of carbohydrates total. Summary  Carbohydrate counting is a method of keeping track of how many carbohydrates you eat.  Eating carbohydrates naturally increases the amount of sugar (glucose) in the blood.  Counting how many carbohydrates you eat helps keep your blood glucose within normal limits, which helps you manage your diabetes.  A diet and nutrition specialist (registered dietitian) can help you make a meal plan and calculate how many carbohydrates you should have at each meal and snack. This information is not intended to replace advice given to you by your health care provider. Make sure you discuss any questions you have with your health care provider. Document Revised: 07/09/2017 Document Reviewed: 05/28/2016 Elsevier Patient Education  2020 [employer] Inc.  

## 2020-06-02 ENCOUNTER — Other Ambulatory Visit: Payer: Self-pay | Admitting: Family Medicine

## 2020-06-02 DIAGNOSIS — E785 Hyperlipidemia, unspecified: Secondary | ICD-10-CM

## 2020-06-02 DIAGNOSIS — E1165 Type 2 diabetes mellitus with hyperglycemia: Secondary | ICD-10-CM

## 2020-06-05 MED ORDER — FENOFIBRATE 160 MG PO TABS: 160.0000 mg | ORAL_TABLET | Freq: Every day | ORAL | 2 refills | Status: DC

## 2020-06-05 NOTE — Addendum Note (Signed)
Addended by: Wynonia Musty A on: 06/05/2020 10:03 AM   Modules accepted: Orders

## 2020-06-06 ENCOUNTER — Ambulatory Visit (INDEPENDENT_AMBULATORY_CARE_PROVIDER_SITE_OTHER): Payer: Medicare HMO | Admitting: Internal Medicine

## 2020-06-06 ENCOUNTER — Other Ambulatory Visit: Payer: Self-pay | Admitting: Internal Medicine

## 2020-06-06 ENCOUNTER — Encounter: Payer: Self-pay | Admitting: Internal Medicine

## 2020-06-06 ENCOUNTER — Other Ambulatory Visit: Payer: Self-pay

## 2020-06-06 VITALS — BP 128/80 | HR 60 | Ht 72.0 in | Wt 218.4 lb

## 2020-06-06 DIAGNOSIS — E1169 Type 2 diabetes mellitus with other specified complication: Secondary | ICD-10-CM

## 2020-06-06 DIAGNOSIS — E785 Hyperlipidemia, unspecified: Secondary | ICD-10-CM | POA: Diagnosis not present

## 2020-06-06 DIAGNOSIS — E1165 Type 2 diabetes mellitus with hyperglycemia: Secondary | ICD-10-CM | POA: Diagnosis not present

## 2020-06-06 DIAGNOSIS — E1140 Type 2 diabetes mellitus with diabetic neuropathy, unspecified: Secondary | ICD-10-CM

## 2020-06-06 LAB — GLUCOSE, POCT (MANUAL RESULT ENTRY): POC Glucose: 238 mg/dl — AB (ref 70–99)

## 2020-06-06 MED ORDER — GLIMEPIRIDE 4 MG PO TABS: 4.0000 mg | ORAL_TABLET | Freq: Every day | ORAL | 3 refills | Status: DC

## 2020-06-06 MED ORDER — METFORMIN HCL ER 500 MG PO TB24: 1000.0000 mg | ORAL_TABLET | Freq: Every day | ORAL | 3 refills | Status: DC

## 2020-06-06 NOTE — Patient Instructions (Addendum)
-   Increase Glimepiride 4 mg , 1 tablet before breakfast  - Increase Metformin 500 mg, TWO tablet with Breakfast     Choose healthy, lower carb lower calorie snacks: toss salad, cooked vegetables, cottage cheese, peanut butter, low fat cheese / string cheese, lower sodium deli meat, tuna salad or chicken salad      HOW TO TREAT LOW BLOOD SUGARS (Blood sugar LESS THAN 70 MG/DL)  Please follow the RULE OF 15 for the treatment of hypoglycemia treatment (when your (blood sugars are less than 70 mg/dL)    STEP 1: Take 15 grams of carbohydrates when your blood sugar is low, which includes:   3-4 GLUCOSE TABS  OR  3-4 OZ OF JUICE OR REGULAR SODA OR  ONE TUBE OF GLUCOSE GEL     STEP 2: RECHECK blood sugar in 15 MINUTES STEP 3: If your blood sugar is still low at the 15 minute recheck --> then, go back to STEP 1 and treat AGAIN with another 15 grams of carbohydrates.

## 2020-06-06 NOTE — Progress Notes (Signed)
Name: Ronald Petersen  MRN/ DOB: [id number], 10-08-1956   Age/ Sex: 64 y.o., male    PCP: [name], Alferd Apa, DO   Reason for Endocrinology Evaluation: Type 2 Diabetes Mellitus     Date of Initial Endocrinology Visit: 06/06/2020     PATIENT IDENTIFIER: Ronald Petersen is a 64 y.o. male with a past medical history of T2DM, OSA, Dyslipidemia . The patient presented for initial endocrinology clinic visit on 06/06/2020 for consultative assistance with his diabetes management.    HPI: Ronald Petersen was    Diagnosed with DM in 2008 Prior Medications tried/Intolerance: Victoza , Ozempic ( samples) . Januvia Currently checking blood sugars 1x / day Hypoglycemia episodes : no          Hemoglobin A1c has ranged from 6.1% in 2016, peaking at 9.7% in 2020. Patient required assistance for hypoglycemia: no Patient has required hospitalization within the last 1 year from hyper or hypoglycemia: no   In terms of diet, the patient eats 3 meals a day, snacks between meals, drinks occasional coke.   No hx of pancreatitis    HOME DIABETES REGIMEN: Glimepiride 2 mg daily  Metformin 500 mg daily , 1 tablet daily     Statin: yes ACE-I/ARB: yes Prior Diabetic Education: no   METER DOWNLOAD SUMMARY: Did not bring    DIABETIC COMPLICATIONS: Microvascular complications:   Neuropathy  Denies: CKD, retinopathy   Last eye exam: Completed 2019  Macrovascular complications:    Denies: CAD, PVD, CVA   PAST HISTORY: Past Medical History:  Past Medical History:  Diagnosis Date  . Anxiety   . Arthritis   . Cancer ([place])   . Complication of anesthesia    aspirated with back surgery at age 52  . Depression   . Diabetes mellitus   . GERD (gastroesophageal reflux disease)   . Hyperlipidemia   . Hypertension   . Neuromuscular disorder ([place])    NEUROPATHY  . Right rotator cuff tear 11/23/2018  . Sleep apnea    no CPAP   Past Surgical History:  Past Surgical History:    Procedure Laterality Date  . APPENDECTOMY    . ARTHOSCOPIC ROTAOR CUFF REPAIR Right 11/23/2018   Procedure: ARTHROSCOPIC ROTATOR CUFF REPAIR;  Surgeon: [name], MD;  Location: [facility];  Service: Orthopedics;  Laterality: Right;  . CHOLECYSTECTOMY    . LUMBAR LAMINECTOMY    . SHOULDER ARTHROSCOPY WITH ROTATOR CUFF REPAIR AND SUBACROMIAL DECOMPRESSION Right 11/23/2018   Procedure: SHOULDER ARTHROSCOPY WITH ROTATOR CUFF REPAIR AND SUBACROMIAL DECOMPRESSION;  Surgeon: [name], MD;  Location: [place] [place];  Service: Orthopedics;  Laterality: Right;      Social History:  reports that he quit smoking about 34 years ago. His smoking use included cigarettes. He quit after 16.00 years of use. He has never used smokeless tobacco. He reports current alcohol use of about 4.0 - 5.0 standard drinks of alcohol per week. He reports that he does not use drugs. Family History:  Family History  Problem Relation Age of Onset  . Ovarian cancer Sister   . Stomach cancer Sister   . Heart disease Sister        MI  . Heart disease Sister        MI  . Alcohol abuse Other   . Depression Other   . Arthritis Other   . Hypertension Other   . Stomach cancer Maternal Grandmother   . COPD Mother   .  Stroke Father   . Coronary artery disease Other   . Ovarian cancer Other        neice  . Ovarian cancer Other        neice  . Colon cancer Other      HOME MEDICATIONS: Allergies as of 06/06/2020      Reactions   Niacin Anaphylaxis      Medication List       Accurate as of June 06, 2020 10:58 AM. If you have any questions, ask your nurse or doctor.        Bayer Contour Test test strip Generic drug: glucose blood Check Blood sugar once daily. Dx E11.9   glucose blood test strip Use as instructed   Bayer Microlet Lancets lancets Check Blood sugar once daily. Dx E11.9   fenofibrate 160 MG tablet Take 1 tablet (160 mg total) by mouth daily.   gabapentin  600 MG tablet Commonly known as: NEURONTIN TAKE 1 TABLET (600 MG TOTAL) BY MOUTH 3 (THREE) TIMES DAILY.   glimepiride 4 MG tablet Commonly known as: [name] Take 1 tablet (4 mg total) by mouth daily before breakfast. What changed:   medication strength  how much to take Changed by: Dorita Sciara, MD   lisinopril 5 MG tablet Commonly known as: ZESTRIL TAKE 1 TABLET (5 MG TOTAL) BY MOUTH DAILY.   metFORMIN 500 MG 24 hr tablet Commonly known as: [name] Take 2 tablets (1,000 mg total) by mouth daily with breakfast. What changed: how much to take Changed by: Dorita Sciara, MD   omeprazole 40 MG capsule Commonly known as: PRILOSEC TAKE 1 CAPSULE (40 MG TOTAL) BY MOUTH DAILY.   ondansetron 4 MG tablet Commonly known as: ZOFRAN Take 1 tablet (4 mg total) by mouth every 8 (eight) hours as needed.   oxyCODONE-acetaminophen 5-325 MG tablet Commonly known as: PERCOCET/ROXICET Take 1 tablet by mouth every 6 (six) hours as needed.   rosuvastatin 20 MG tablet Commonly known as: Crestor Take 1 tablet (20 mg total) by mouth daily.   sennosides-docusate sodium 8.6-50 MG tablet Commonly known as: SENOKOT-S Take 2 tablets by mouth daily.   sertraline 100 MG tablet Commonly known as: ZOLOFT Take 1 tablet (100 mg total) by mouth daily.   tiZANidine 4 MG tablet Commonly known as: ZANAFLEX TAKE 1 TABLET (4 MG TOTAL) BY MOUTH EVERY 6 (SIX) HOURS AS NEEDED FOR MUSCLE SPASMS.   traZODone 50 MG tablet Commonly known as: DESYREL Take 0.5-1 tablets (25-50 mg total) by mouth at bedtime as needed for sleep.        ALLERGIES: Allergies  Allergen Reactions  . Niacin Anaphylaxis     OBJECTIVE:   VITAL SIGNS: BP 128/80 (BP Location: Right Arm, Patient Position: Sitting, Cuff Size: Normal)   Pulse 60   Ht 6' (1.829 m)   Wt 218 lb 6.4 oz (99.1 kg)   SpO2 98%   BMI 29.62 kg/m    PHYSICAL EXAM:  General: Pt appears well and is in NAD  Neck: General: Supple  without adenopathy or carotid bruits. Thyroid: Thyroid size normal.  No goiter or nodules appreciated. No thyroid bruit.  Lungs: Clear with good BS bilat with no rales, rhonchi, or wheezes  Heart: RRR with normal S1 and S2 and no gallops; no murmurs; no rub  Abdomen: Normoactive bowel sounds, soft, nontender, without masses or organomegaly palpable  Extremities:  Lower extremities - No pretibial edema. No lesions.  Skin: Normal texture and temperature to palpation.  Neuro: MS is good with appropriate affect, pt is alert and Ox3     DATA REVIEWED:  Lab Results  Component Value Date   HGBA1C 9.3 (H) 05/31/2020   HGBA1C 8.3 (H) 02/07/2020   HGBA1C 7.3 (H) 11/14/2019   Lab Results  Component Value Date   MICROALBUR 1.7 05/31/2020   LDLCALC 113 (H) 02/07/2020   CREATININE 0.77 05/31/2020   Lab Results  Component Value Date   MICRALBCREAT 1.9 05/31/2020    Lab Results  Component Value Date   CHOL 171 05/31/2020   HDL 29.80 (L) 05/31/2020   LDLCALC 113 (H) 02/07/2020   LDLDIRECT 93.0 05/31/2020   TRIG 268.0 (H) 05/31/2020   CHOLHDL 6 05/31/2020        ASSESSMENT / PLAN / RECOMMENDATIONS:   1) Type 2 Diabetes Mellitus, Poorly controlled, With Neuropathic complications - Most recent A1c of 9.3 %. Goal A1c < 7.0 %.    Plan: GENERAL: I have discussed with the patient the pathophysiology of diabetes. We went over the natural progression of the disease. We talked about both insulin resistance and insulin deficiency. We stressed the importance of lifestyle changes including diet and exercise. I explained the complications associated with diabetes including retinopathy, nephropathy, neuropathy as well as increased risk of cardiovascular disease. We went over the benefit seen with glycemic control.    I explained to the patient that diabetic patients are at higher than normal risk for amputations. The patient was informed that diabetes is the number one cause of non-traumatic  amputations in [place].   We discussed the importance of avoiding sugar-sweetened beverages, avoiding snack and discussed options for low carb snacks if necessary.   He has been on Januvia and Ozempic without reported intolerance. He tells me his PCP tried to prescribe a glycemic agent but was going to cost him $600. Pt does not recall the name of this medicine. We have discussed titrating his current regimen, in the meantime he was advised to contact [employer] and obtain their formulary list for next visit.   MEDICATIONS: - Increase Glimepiride 4 mg , 1 tablet before breakfast  - Increase Metformin 500 mg, TWO tablet with Breakfast      EDUCATION / INSTRUCTIONS:  BG monitoring instructions: Patient is instructed to check his blood sugars 1 times a day.  Call [facility] if: BG persistently < 70 . I reviewed the Rule of 15 for the treatment of hypoglycemia in detail with the patient. Literature supplied.   2) Diabetic complications:   Eye: Does not have known diabetic retinopathy. Pt is planning on scheduling an eye exam soon   Neuro/ Feet: Does have known diabetic peripheral neuropathy.  Renal: Patient does not have known baseline CKD. He is on on an ACEI/ARB at present.  3) Lipids: Patient is on Rosuvastatin , LDL at goal.Tg elevated , I am hoping this would improve with low carb diet.      Signed electronically by: Mack Guise, MD  [facility]  [facility] [address] [place], [place] [address] Phone: [phone] FAX: [phone]   CC: [address] [place] [address] Phone: [phone]  Fax: [phone]    Return to Endocrinology clinic as below: Future Appointments  Date Time Provider [place]  08/31/2020  9:20 AM [name], DO [facility]  09/11/2020 10:30 AM Nikkolas Coomes, Melanie Crazier, MD [facility]

## 2020-06-12 ENCOUNTER — Ambulatory Visit: Payer: Medicaid Other | Admitting: Family Medicine

## 2020-06-12 ENCOUNTER — Other Ambulatory Visit: Payer: Self-pay | Admitting: Family Medicine

## 2020-06-12 DIAGNOSIS — K219 Gastro-esophageal reflux disease without esophagitis: Secondary | ICD-10-CM

## 2020-06-12 MED ORDER — OMEPRAZOLE 40 MG PO CPDR: DELAYED_RELEASE_CAPSULE | ORAL | 3 refills | Status: DC

## 2020-06-12 MED FILL — OMEPRAZOLE 40 MG CPDR: 40 | 90 days supply | Qty: 180 | Fill #0

## 2020-06-18 ENCOUNTER — Other Ambulatory Visit: Payer: Self-pay | Admitting: Family Medicine

## 2020-06-18 DIAGNOSIS — G8929 Other chronic pain: Secondary | ICD-10-CM

## 2020-06-18 MED FILL — GLIMEPIRIDE 4 MG TABLET: 4 | 90 days supply | Qty: 90 | Fill #0

## 2020-06-19 MED FILL — OXYCODONE-ACETAMINOPHEN 5-3: 5-325 | 23 days supply | Qty: 90 | Fill #0

## 2020-06-19 NOTE — Telephone Encounter (Signed)
Oxycodone refill.   Last OV: 05/31/20 Last Fill:  05/17/2020 #90 and 0RF Pt sig: 1 tab q6h prn UDS: 11/14/2019 Low risk

## 2020-07-03 MED FILL — metFORMIN HCL ER 500 MG TB2: 500 | 90 days supply | Qty: 180 | Fill #0

## 2020-07-04 MED FILL — FENOFIBRATE 160 MG TABLET: 160 | 30 days supply | Qty: 30 | Fill #1

## 2020-07-18 ENCOUNTER — Other Ambulatory Visit: Payer: Self-pay | Admitting: Family Medicine

## 2020-07-18 DIAGNOSIS — G8929 Other chronic pain: Secondary | ICD-10-CM

## 2020-07-18 DIAGNOSIS — E785 Hyperlipidemia, unspecified: Secondary | ICD-10-CM

## 2020-07-18 MED FILL — GABAPENTIN 600 MG TABLET: 600 | 30 days supply | Qty: 90 | Fill #3

## 2020-07-18 NOTE — Telephone Encounter (Signed)
Last office visit-05-31-2020 Last refill- 06-19-2020 Next O.V- 09-04-2020

## 2020-07-19 MED FILL — OXYCODONE-ACETAMINOPHEN 5-3: 5-325 | 23 days supply | Qty: 90 | Fill #0

## 2020-07-19 MED FILL — PRAVASTATIN NA 40 MG TAB: 40 | 90 days supply | Qty: 90 | Fill #0

## 2020-08-17 ENCOUNTER — Other Ambulatory Visit: Payer: Self-pay | Admitting: Family Medicine

## 2020-08-17 DIAGNOSIS — F329 Major depressive disorder, single episode, unspecified: Secondary | ICD-10-CM

## 2020-08-17 DIAGNOSIS — G8929 Other chronic pain: Secondary | ICD-10-CM

## 2020-08-17 DIAGNOSIS — F32A Depression, unspecified: Secondary | ICD-10-CM

## 2020-08-17 MED FILL — FENOFIBRATE 160 MG TABLET: 160 | 30 days supply | Qty: 30 | Fill #2

## 2020-08-17 MED FILL — GABAPENTIN 600 MG TABLET: 600 | 30 days supply | Qty: 90 | Fill #4

## 2020-08-17 MED FILL — LISINOPRIL 5 MG TABLET: 5 | 90 days supply | Qty: 90 | Fill #1

## 2020-08-20 MED FILL — SERTRALINE HCL 100 MG TABS: 100 | 90 days supply | Qty: 90 | Fill #0

## 2020-08-20 MED FILL — OXYCODONE-ACETAMINOPHEN 5-3: 5-325 | 15 days supply | Qty: 90 | Fill #0

## 2020-08-20 NOTE — Telephone Encounter (Signed)
Oxycodone refill.   Last OV: 05/31/2020 Last Fill: 07/18/2020 #90 and 0RF Pt sig: 1 tab q6h prn UDS: 11/14/2019 Low risk

## 2020-08-31 ENCOUNTER — Ambulatory Visit: Payer: Medicare HMO | Admitting: Family Medicine

## 2020-09-04 ENCOUNTER — Ambulatory Visit: Payer: Medicare HMO | Admitting: Family Medicine

## 2020-09-06 ENCOUNTER — Other Ambulatory Visit: Payer: Self-pay

## 2020-09-06 ENCOUNTER — Other Ambulatory Visit: Payer: Self-pay | Admitting: Family Medicine

## 2020-09-06 ENCOUNTER — Encounter: Payer: Self-pay | Admitting: Family Medicine

## 2020-09-06 ENCOUNTER — Ambulatory Visit (INDEPENDENT_AMBULATORY_CARE_PROVIDER_SITE_OTHER): Payer: Medicare HMO | Admitting: Family Medicine

## 2020-09-06 VITALS — BP 118/60 | HR 50 | Temp 98.0°F | Resp 18 | Ht 72.0 in | Wt 219.8 lb

## 2020-09-06 DIAGNOSIS — R001 Bradycardia, unspecified: Secondary | ICD-10-CM | POA: Diagnosis not present

## 2020-09-06 DIAGNOSIS — E1169 Type 2 diabetes mellitus with other specified complication: Secondary | ICD-10-CM | POA: Diagnosis not present

## 2020-09-06 DIAGNOSIS — E785 Hyperlipidemia, unspecified: Secondary | ICD-10-CM

## 2020-09-06 DIAGNOSIS — R5383 Other fatigue: Secondary | ICD-10-CM

## 2020-09-06 DIAGNOSIS — E1165 Type 2 diabetes mellitus with hyperglycemia: Secondary | ICD-10-CM

## 2020-09-06 DIAGNOSIS — I10 Essential (primary) hypertension: Secondary | ICD-10-CM

## 2020-09-06 DIAGNOSIS — F32A Depression, unspecified: Secondary | ICD-10-CM

## 2020-09-06 DIAGNOSIS — F329 Major depressive disorder, single episode, unspecified: Secondary | ICD-10-CM

## 2020-09-06 DIAGNOSIS — R69 Illness, unspecified: Secondary | ICD-10-CM | POA: Diagnosis not present

## 2020-09-06 MED ORDER — SERTRALINE HCL 100 MG PO TABS: 100.0000 mg | ORAL_TABLET | Freq: Every day | ORAL | 1 refills | Status: DC

## 2020-09-06 MED ORDER — FENOFIBRATE 160 MG PO TABS: 160.0000 mg | ORAL_TABLET | Freq: Every day | ORAL | 1 refills | Status: DC

## 2020-09-06 NOTE — Patient Instructions (Signed)
Bradycardia, Adult Bradycardia is a slower-than-normal heartbeat. A normal resting heart rate for an adult ranges from 60 to 100 beats per minute. With bradycardia, the resting heart rate is less than 60 beats per minute. Bradycardia can prevent enough oxygen from reaching certain areas of your body when you are active. It can be serious if it keeps enough oxygen from reaching your brain and other parts of your body. Bradycardia is not a problem for everyone. For some healthy adults, a slow resting heart rate is normal. What are the causes? This condition may be caused by:  A problem with the heart, including: ? A problem with the heart's electrical system, such as a heart block. With a heart block, electrical signals between the chambers of the heart are partially or completely blocked, so they are not able to work as they should. ? A problem with the heart's natural pacemaker (sinus node). ? Heart disease. ? A heart attack. ? Heart damage. ? Lyme disease. ? A heart infection. ? A heart condition that is present at birth (congenital heart defect).  Certain medicines that treat heart conditions.  Certain conditions, such as hypothyroidism and obstructive sleep apnea.  Problems with the balance of chemicals and other substances, like potassium, in the blood.  Trauma.  Radiation therapy. What increases the risk? You are more likely to develop this condition if you:  Are age 65 or older.  Have high blood pressure (hypertension), high cholesterol (hyperlipidemia), or diabetes.  Drink heavily, use tobacco or nicotine products, or use drugs. What are the signs or symptoms? Symptoms of this condition include:  Light-headedness.  Feeling faint or fainting.  Fatigue and weakness.  Trouble with activity or exercise.  Shortness of breath.  Chest pain (angina).  Drowsiness.  Confusion.  Dizziness. How is this diagnosed? This condition may be diagnosed based on:  Your  symptoms.  Your medical history.  A physical exam. During the exam, your health care provider will listen to your heartbeat and check your pulse. To confirm the diagnosis, your health care provider may order tests, such as:  Blood tests.  An electrocardiogram (ECG). This test records the heart's electrical activity. The test can show how fast your heart is beating and whether the heartbeat is steady.  A test in which you wear a portable device (event recorder or Holter monitor) to record your heart's electrical activity while you go about your day.  Anexercise test. How is this treated? Treatment for this condition depends on the cause of the condition and how severe your symptoms are. Treatment may involve:  Treatment of the underlying condition.  Changing your medicines or how much medicine you take.  Having a small, battery-operated device called a pacemaker implanted under the skin. When bradycardia occurs, this device can be used to increase your heart rate and help your heart beat in a regular rhythm. Follow these instructions at home: Lifestyle   Manage any health conditions that contribute to bradycardia as told by your health care provider.  Follow a heart-healthy diet. A nutrition specialist (dietitian) can help educate you about healthy food options and changes.  Follow an exercise program that is approved by your health care provider.  Maintain a healthy weight.  Try to reduce or manage your stress, such as with yoga or meditation. If you need help reducing stress, ask your health care provider.  Do not use any products that contain nicotine or tobacco, such as cigarettes, e-cigarettes, and chewing tobacco. If you need help   quitting, ask your health care provider.  Do not use illegal drugs.  Limit alcohol intake to no more than 1 drink a day for nonpregnant women and 2 drinks a day for men. Be aware of how much alcohol is in your drink. In the [place] one drink  equals one 12 oz bottle of beer (355 mL), one 5 oz glass of wine (148 mL), or one 1 oz glass of hard liquor (44 mL). General instructions  Take over-the-counter and prescription medicines only as told by your health care provider.  Keep all follow-up visits as told by your health care provider. This is important. How is this prevented? In some cases, bradycardia may be prevented by:  Treating underlying medical problems.  Stopping behaviors or medicines that can trigger the condition. Contact a health care provider if you:  Feel light-headed or dizzy.  Almost faint.  Feel weak or are easily fatigued during physical activity.  Experience confusion or have memory problems. Get help right away if:  You faint.  You have: ? An irregular heartbeat (palpitations). ? Chest pain. ? Trouble breathing. Summary  Bradycardia is a slower-than-normal heartbeat. With bradycardia, the resting heart rate is less than 60 beats per minute.  Treatment for this condition depends on the cause.  Manage any health conditions that contribute to bradycardia as told by your health care provider.  Do not use any products that contain nicotine or tobacco, such as cigarettes, e-cigarettes, and chewing tobacco, and limit alcohol intake.  Keep all follow-up visits as told by your health care provider. This is important. This information is not intended to replace advice given to you by your health care provider. Make sure you discuss any questions you have with your health care provider. Document Revised: 06/28/2018 Document Reviewed: 05/26/2018 Elsevier Patient Education  2020 [employer]  

## 2020-09-06 NOTE — Assessment & Plan Note (Signed)
Encouraged heart healthy diet, increase exercise, avoid trans fats, consider a krill oil cap daily 

## 2020-09-06 NOTE — Assessment & Plan Note (Signed)
Low pulse rate  ekg done --  Sinus brady Check labs  Refer to cardiology

## 2020-09-06 NOTE — Assessment & Plan Note (Signed)
Well controlled, no changes to meds. Encouraged heart healthy diet such as the DASH diet and exercise as tolerated.  °

## 2020-09-06 NOTE — Progress Notes (Signed)
Patient ID: Ronald Petersen, male    DOB: [birth year]  Age: 64 y.o. MRN: [id number]    Subjective:  Subjective  HPI GRIFFON HERBERG presents for f/u bp and cholesterol.  He has an app with endo next week.   He also c/o fatigue and dragging a lot lately and his pulse has been low   Review of Systems  Constitutional: Positive for fatigue. Negative for appetite change, diaphoresis and unexpected weight change.  Eyes: Negative for pain, redness and visual disturbance.  Respiratory: Negative for cough, chest tightness, shortness of breath and wheezing.   Cardiovascular: Negative for chest pain, palpitations and leg swelling.  Endocrine: Negative for cold intolerance, heat intolerance, polydipsia, polyphagia and polyuria.  Genitourinary: Negative for difficulty urinating, dysuria and frequency.  Neurological: Negative for dizziness, light-headedness, numbness and headaches.    History Past Medical History:  Diagnosis Date   Anxiety    Arthritis    Cancer ([place])    Complication of anesthesia    aspirated with back surgery at age 41   Depression    Diabetes mellitus    GERD (gastroesophageal reflux disease)    Hyperlipidemia    Hypertension    Neuromuscular disorder ([place])    NEUROPATHY   Right rotator cuff tear 11/23/2018   Sleep apnea    no CPAP    He has a past surgical history that includes Appendectomy; Cholecystectomy; Lumbar laminectomy; Shoulder arthroscopy with rotator cuff repair and subacromial decompression (Right, 11/23/2018); and Arthroscopic rotator cuff repair (Right, 11/23/2018).   His family history includes Alcohol abuse in an other family member; Arthritis in an other family member; COPD in his mother; Colon cancer in an other family member; Coronary artery disease in an other family member; Depression in an other family member; Heart disease in his sister and sister; Hypertension in an other family member; Ovarian cancer in his sister and other family  members; Stomach cancer in his maternal grandmother and sister; Stroke in his father.He reports that he quit smoking about 34 years ago. His smoking use included cigarettes. He quit after 16.00 years of use. He has never used smokeless tobacco. He reports current alcohol use of about 4.0 - 5.0 standard drinks of alcohol per week. He reports that he does not use drugs.  Current Outpatient Medications on File Prior to Visit  Medication Sig Dispense Refill   BAYER CONTOUR TEST test strip Check Blood sugar once daily. Dx E11.9 50 each 12   BAYER MICROLET LANCETS lancets Check Blood sugar once daily. Dx E11.9 50 each 11   gabapentin (NEURONTIN) 600 MG tablet TAKE 1 TABLET (600 MG TOTAL) BY MOUTH 3 (THREE) TIMES DAILY. 90 tablet 5   glimepiride ([name]) 4 MG tablet Take 1 tablet (4 mg total) by mouth daily before breakfast. 90 tablet 3   glucose blood test strip Use as instructed 100 each 12   lisinopril (ZESTRIL) 5 MG tablet TAKE 1 TABLET (5 MG TOTAL) BY MOUTH DAILY. 90 tablet 1   metFORMIN ([name]) 500 MG 24 hr tablet Take 2 tablets (1,000 mg total) by mouth daily with breakfast. 180 tablet 3   omeprazole (PRILOSEC) 40 MG capsule 1 po bid 180 capsule 3   ondansetron (ZOFRAN) 4 MG tablet Take 1 tablet (4 mg total) by mouth every 8 (eight) hours as needed. 20 tablet 2   oxyCODONE-acetaminophen (PERCOCET/ROXICET) 5-325 MG tablet TAKE 1 TABLET BY MOUTH EVERY 6 HOURS AS NEEDED 90 tablet 0   pravastatin (PRAVACHOL) 40 MG tablet  TAKE 1 TABLET (40 MG TOTAL) BY MOUTH DAILY. 90 tablet 3   sennosides-docusate sodium (SENOKOT-S) 8.6-50 MG tablet Take 2 tablets by mouth daily. 30 tablet 1   tiZANidine (ZANAFLEX) 4 MG tablet TAKE 1 TABLET (4 MG TOTAL) BY MOUTH EVERY 6 (SIX) HOURS AS NEEDED FOR MUSCLE SPASMS. 30 tablet 1   traZODone (DESYREL) 50 MG tablet Take 0.5-1 tablets (25-50 mg total) by mouth at bedtime as needed for sleep. 30 tablet 3   No current facility-administered medications on  file prior to visit.     Objective:  Objective  Physical Exam Vitals and nursing note reviewed.  Constitutional:      General: He is sleeping.     Appearance: He is well-developed.  HENT:     Head: Normocephalic and atraumatic.  Eyes:     Pupils: Pupils are equal, round, and reactive to light.  Neck:     Thyroid: No thyromegaly.  Cardiovascular:     Rate and Rhythm: Regular rhythm. Bradycardia present.     Heart sounds: No murmur heard.   Pulmonary:     Effort: Pulmonary effort is normal. No respiratory distress.     Breath sounds: Normal breath sounds. No wheezing or rales.  Chest:     Chest wall: No tenderness.  Musculoskeletal:        General: No tenderness.     Cervical back: Normal range of motion and neck supple.  Skin:    General: Skin is warm and dry.  Neurological:     Mental Status: He is oriented to person, place, and time.  Psychiatric:        Behavior: Behavior normal.        Thought Content: Thought content normal.        Judgment: Judgment normal.   tBP 118/60 (BP Location: Right Arm, Patient Position: Sitting, Cuff Size: Normal)    Pulse (!) 50    Temp 98 F (36.7 C) (Oral)    Resp 18    Ht 6' (1.829 m)    Wt 219 lb 12.8 oz (99.7 kg)    SpO2 98%    BMI 29.81 kg/m  Wt Readings from Last 3 Encounters:  09/06/20 219 lb 12.8 oz (99.7 kg)  06/06/20 218 lb 6.4 oz (99.1 kg)  05/31/20 219 lb (99.3 kg)     Lab Results  Component Value Date   WBC 6.9 02/07/2020   HGB 14.8 02/07/2020   HCT 44.0 02/07/2020   PLT 193.0 02/07/2020   GLUCOSE 257 (H) 05/31/2020   CHOL 171 05/31/2020   TRIG 268.0 (H) 05/31/2020   HDL 29.80 (L) 05/31/2020   LDLDIRECT 93.0 05/31/2020   LDLCALC 113 (H) 02/07/2020   ALT 22 05/31/2020   AST 14 05/31/2020   NA 135 05/31/2020   K 4.0 05/31/2020   CL 99 05/31/2020   CREATININE 0.77 05/31/2020   BUN 24 (H) 05/31/2020   CO2 29 05/31/2020   TSH 0.97 10/25/2012   PSA 1.38 02/09/2019   HGBA1C 9.3 (H) 05/31/2020   MICROALBUR 1.7  05/31/2020   EKg--  Sinus brady 50 bpm    MR Cervical Spine w/o contrast  Result Date: 07/24/2019 CLINICAL DATA:  Neck pain.  Right-greater-than-left symptoms. EXAM: MRI CERVICAL SPINE WITHOUT CONTRAST TECHNIQUE: Multiplanar, multisequence MR imaging of the cervical spine was performed. No intravenous contrast was administered. COMPARISON:  Cervical spine MRI 11/10/2015 FINDINGS: Alignment: Physiologic. Vertebrae: No fracture, evidence of discitis, or bone lesion. Cord: Normal signal and morphology. Posterior Fossa, vertebral  arteries, paraspinal tissues: Visualized posterior fossa is normal. Vertebral artery flow voids are preserved. No prevertebral soft tissue swelling. Disc levels: C2-3: Normal. C3-4: Left subarticular disc protrusion, unchanged. No spinal canal or neural foraminal stenosis. C4-5: Normal. C5-6: Unchanged intermediate sized left subarticular disc osteophyte complex. Severe left neural foraminal stenosis. No spinal canal or right neural foraminal stenosis. C6-7: Small disc bulge without spinal canal or neural foraminal stenosis. C7-T1: No spinal canal or neural foraminal stenosis. IMPRESSION: Unchanged C5-6 left subarticular disc osteophyte complex with severe left neural foraminal stenosis. Electronically Signed   By: Ulyses Jarred M.D.   On: 07/24/2019 00:47     Assessment & Plan:  Plan  I have discontinued Joseeduardo D. Canales "Dale"'s rosuvastatin. I am also having him maintain his [employer] Lancets, sennosides-docusate sodium, tiZANidine, glucose blood, traZODone, gabapentin, lisinopril, ondansetron, glimepiride, metFORMIN, omeprazole, pravastatin, oxyCODONE-acetaminophen, sertraline, and fenofibrate.  Meds ordered this encounter  Medications   sertraline (ZOLOFT) 100 MG tablet    Sig: Take 1 tablet (100 mg total) by mouth daily.    Dispense:  90 tablet    Refill:  1   fenofibrate 160 MG tablet    Sig: Take 1 tablet (160 mg total) by mouth daily.     Dispense:  90 tablet    Refill:  1    Problem List Items Addressed This Visit      Unprioritized   Depression   Relevant Medications   sertraline (ZOLOFT) 100 MG tablet   Essential hypertension    Well controlled, no changes to meds. Encouraged heart healthy diet such as the DASH diet and exercise as tolerated.       Relevant Medications   fenofibrate 160 MG tablet   Other Relevant Orders   Lipid panel   Hemoglobin A1c   Comprehensive metabolic panel   Fatigue    Low pulse rate  ekg done --  Sinus brady Check labs  Refer to cardiology      Hyperlipidemia associated with type 2 diabetes mellitus ([place])    Encouraged heart healthy diet, increase exercise, avoid trans fats, consider a krill oil cap daily      Relevant Medications   fenofibrate 160 MG tablet   Other Relevant Orders   Lipid panel   Hemoglobin A1c   Comprehensive metabolic panel   Uncontrolled type 2 diabetes mellitus with hyperglycemia ([facility]) - Primary   Relevant Orders   Lipid panel   Hemoglobin A1c   Comprehensive metabolic panel    Other Visit Diagnoses    Bradycardia       Relevant Orders   EKG 12-Lead (Completed)   Ambulatory referral to Cardiology      Follow-up: Return in about 6 months (around 03/06/2021), or if symptoms worsen or fail to improve, for hypertension, hyperlipidemia.  [name], DO

## 2020-09-07 LAB — COMPREHENSIVE METABOLIC PANEL
AG Ratio: 2 (calc) (ref 1.0–2.5)
ALT: 24 U/L (ref 9–46)
AST: 22 U/L (ref 10–35)
Albumin: 4.7 g/dL (ref 3.6–5.1)
Alkaline phosphatase (APISO): 48 U/L (ref 35–144)
BUN: 20 mg/dL (ref 7–25)
CO2: 28 mmol/L (ref 20–32)
Calcium: 9.7 mg/dL (ref 8.6–10.3)
Chloride: 102 mmol/L (ref 98–110)
Creat: 0.98 mg/dL (ref 0.70–1.25)
Globulin: 2.4 g/dL (calc) (ref 1.9–3.7)
Glucose, Bld: 130 mg/dL — ABNORMAL HIGH (ref 65–99)
Potassium: 4.9 mmol/L (ref 3.5–5.3)
Sodium: 139 mmol/L (ref 135–146)
Total Bilirubin: 0.6 mg/dL (ref 0.2–1.2)
Total Protein: 7.1 g/dL (ref 6.1–8.1)

## 2020-09-07 LAB — HEMOGLOBIN A1C
Hgb A1c MFr Bld: 7.3 % of total Hgb — ABNORMAL HIGH (ref <5.7)
Mean Plasma Glucose: 163 (calc)
eAG (mmol/L): 9 (calc)

## 2020-09-07 LAB — LIPID PANEL
Cholesterol: 157 mg/dL (ref <200)
HDL: 34 mg/dL — ABNORMAL LOW (ref >=40)
LDL Cholesterol (Calc): 96 mg/dL (calc)
Non-HDL Cholesterol (Calc): 123 mg/dL (calc) (ref <130)
Total CHOL/HDL Ratio: 4.6 (calc) (ref <5.0)
Triglycerides: 174 mg/dL — ABNORMAL HIGH (ref <150)

## 2020-09-09 ENCOUNTER — Other Ambulatory Visit: Payer: Self-pay | Admitting: Family Medicine

## 2020-09-09 DIAGNOSIS — E785 Hyperlipidemia, unspecified: Secondary | ICD-10-CM

## 2020-09-11 ENCOUNTER — Encounter: Payer: Self-pay | Admitting: Internal Medicine

## 2020-09-11 ENCOUNTER — Other Ambulatory Visit: Payer: Self-pay

## 2020-09-11 ENCOUNTER — Ambulatory Visit (INDEPENDENT_AMBULATORY_CARE_PROVIDER_SITE_OTHER): Payer: Medicare HMO | Admitting: Internal Medicine

## 2020-09-11 VITALS — BP 118/70 | HR 58 | Ht 72.0 in | Wt 223.6 lb

## 2020-09-11 DIAGNOSIS — E785 Hyperlipidemia, unspecified: Secondary | ICD-10-CM

## 2020-09-11 DIAGNOSIS — E1165 Type 2 diabetes mellitus with hyperglycemia: Secondary | ICD-10-CM

## 2020-09-11 DIAGNOSIS — E1169 Type 2 diabetes mellitus with other specified complication: Secondary | ICD-10-CM | POA: Diagnosis not present

## 2020-09-11 LAB — POCT GLUCOSE (DEVICE FOR HOME USE): POC Glucose: 180 mg/dl — AB (ref 70–99)

## 2020-09-11 MED ORDER — METFORMIN HCL ER 500 MG PO TB24: ORAL_TABLET | ORAL | 3 refills | Status: DC

## 2020-09-11 MED ORDER — ROSUVASTATIN CALCIUM 20 MG PO TABS: 20.0000 mg | ORAL_TABLET | Freq: Every day | ORAL | 2 refills | Status: DC

## 2020-09-11 MED FILL — ROSUVASTATIN CALCIUM 20 MG: 20 | 30 days supply | Qty: 30 | Fill #0

## 2020-09-11 MED FILL — metFORMIN HCL ER 500 MG TB2: 500 | 90 days supply | Qty: 270 | Fill #0

## 2020-09-11 NOTE — Progress Notes (Signed)
Name: Ronald Petersen  Age/ Sex: 64 y.o., male   MRN/ DOB: [id number], 1956-11-30     PCP: [name], Alferd Apa, DO   Reason for Endocrinology Evaluation: Type 2 Diabetes Mellitus  Initial Endocrine Consultative Visit: 06/06/2020    PATIENT IDENTIFIER: Ronald Petersen is a 64 y.o. male with a past medical history of T2DM, OSA and Dyslipidemia . The patient has followed with Endocrinology clinic since 06/06/2020 for consultative assistance with management of his diabetes.  DIABETIC HISTORY:  Ronald Petersen was diagnosed with DM in 2008, has been on victoza, [place] and Januvia. His hemoglobin A1c has ranged from 6.1% in 2016, peaking at 9.7% in 2020.  No hx of pancreatitis     On his initial visit to our clinic, his A1c was 9.3% , he was on Metformin and Glimepiride. Which we increased.   SUBJECTIVE:   During the last visit (06/06/2020): A1c 9.3% . We increased metformin and Glimepiride   Today (09/11/2020): Ronald Petersen is here for a follow up on diabetes.  He checks his blood sugars 1 times daily, he forgot his meter today. The patient has not had hypoglycemic episodes since the last clinic visit.  Pt with nocturia  Wants a referral to podiatry    HOME DIABETES REGIMEN:  Glimepiride 4 mg , 1 tablet before breakfast  Metformin 500 mg, TWO tablet with Breakfast      Statin: Yes ACE-I/ARB: Yes    METER DOWNLOAD SUMMARY: Did not bring    DIABETIC COMPLICATIONS: Microvascular complications:   Neuropathy  Denies: CKD, Retinopathy  Last Eye Exam: Completed   Macrovascular complications:   Denies: CAD, CVA, PVD   HISTORY:  Past Medical History:  Past Medical History:  Diagnosis Date  . Anxiety   . Arthritis   . Cancer ([place])   . Complication of anesthesia    aspirated with back surgery at age 16  . Depression   . Diabetes mellitus   . GERD (gastroesophageal reflux disease)   . Hyperlipidemia   . Hypertension   . Neuromuscular disorder ([place])    NEUROPATHY   . Right rotator cuff tear 11/23/2018  . Sleep apnea    no CPAP   Past Surgical History:  Past Surgical History:  Procedure Laterality Date  . APPENDECTOMY    . ARTHOSCOPIC ROTAOR CUFF REPAIR Right 11/23/2018   Procedure: ARTHROSCOPIC ROTATOR CUFF REPAIR;  Surgeon: [name], MD;  Location: [facility];  Service: Orthopedics;  Laterality: Right;  . CHOLECYSTECTOMY    . LUMBAR LAMINECTOMY    . SHOULDER ARTHROSCOPY WITH ROTATOR CUFF REPAIR AND SUBACROMIAL DECOMPRESSION Right 11/23/2018   Procedure: SHOULDER ARTHROSCOPY WITH ROTATOR CUFF REPAIR AND SUBACROMIAL DECOMPRESSION;  Surgeon: [name], MD;  Location: [facility];  Service: Orthopedics;  Laterality: Right;    Social History:  reports that he quit smoking about 34 years ago. His smoking use included cigarettes. He quit after 16.00 years of use. He has never used smokeless tobacco. He reports current alcohol use of about 4.0 - 5.0 standard drinks of alcohol per week. He reports that he does not use drugs. Family History:  Family History  Problem Relation Age of Onset  . Ovarian cancer Sister   . Stomach cancer Sister   . Heart disease Sister        MI  . Heart disease Sister        MI  . Alcohol abuse Other   . Depression Other   . Arthritis  Other   . Hypertension Other   . Stomach cancer Maternal Grandmother   . COPD Mother   . Stroke Father   . Coronary artery disease Other   . Ovarian cancer Other        neice  . Ovarian cancer Other        neice  . Colon cancer Other      HOME MEDICATIONS: Allergies as of 09/11/2020      Reactions   Niacin Anaphylaxis      Medication List       Accurate as of September 11, 2020 10:57 AM. If you have any questions, ask your nurse or doctor.        Bayer Contour Test test strip Generic drug: glucose blood Check Blood sugar once daily. Dx E11.9   glucose blood test strip Use as instructed   Bayer Microlet Lancets lancets Check  Blood sugar once daily. Dx E11.9   fenofibrate 160 MG tablet Take 1 tablet (160 mg total) by mouth daily.   gabapentin 600 MG tablet Commonly known as: NEURONTIN TAKE 1 TABLET (600 MG TOTAL) BY MOUTH 3 (THREE) TIMES DAILY.   glimepiride 4 MG tablet Commonly known as: [name] Take 1 tablet (4 mg total) by mouth daily before breakfast.   lisinopril 5 MG tablet Commonly known as: ZESTRIL TAKE 1 TABLET (5 MG TOTAL) BY MOUTH DAILY.   metFORMIN 500 MG 24 hr tablet Commonly known as: [name] Take 2 tablets (1,000 mg total) by mouth daily with breakfast.   omeprazole 40 MG capsule Commonly known as: PRILOSEC 1 po bid   ondansetron 4 MG tablet Commonly known as: ZOFRAN Take 1 tablet (4 mg total) by mouth every 8 (eight) hours as needed.   oxyCODONE-acetaminophen 5-325 MG tablet Commonly known as: PERCOCET/ROXICET TAKE 1 TABLET BY MOUTH EVERY 6 HOURS AS NEEDED   pravastatin 40 MG tablet Commonly known as: PRAVACHOL TAKE 1 TABLET (40 MG TOTAL) BY MOUTH DAILY.   sennosides-docusate sodium 8.6-50 MG tablet Commonly known as: SENOKOT-S Take 2 tablets by mouth daily.   sertraline 100 MG tablet Commonly known as: ZOLOFT Take 1 tablet (100 mg total) by mouth daily.   tiZANidine 4 MG tablet Commonly known as: ZANAFLEX TAKE 1 TABLET (4 MG TOTAL) BY MOUTH EVERY 6 (SIX) HOURS AS NEEDED FOR MUSCLE SPASMS.   traZODone 50 MG tablet Commonly known as: DESYREL Take 0.5-1 tablets (25-50 mg total) by mouth at bedtime as needed for sleep.        OBJECTIVE:   Vital Signs: BP 118/70 (BP Location: Right Arm, Patient Position: Sitting, Cuff Size: Normal)   Pulse (!) 58   Ht 6' (1.829 m)   Wt 223 lb 9.6 oz (101.4 kg)   SpO2 98%   BMI 30.33 kg/m   Wt Readings from Last 3 Encounters:  09/11/20 223 lb 9.6 oz (101.4 kg)  09/06/20 219 lb 12.8 oz (99.7 kg)  06/06/20 218 lb 6.4 oz (99.1 kg)     Exam: General: Pt appears well and is in NAD  Neck: General: Supple without  adenopathy. Thyroid: Thyroid size normal.  No goiter or nodules appreciated. No thyroid bruit.  Lungs: Clear with good BS bilat with no rales, rhonchi, or wheezes  Heart: RRR with normal S1 and S2 and no gallops; no murmurs; no rub  Abdomen: Normoactive bowel sounds, soft, nontender, without masses or organomegaly palpable  Extremities: No pretibial edema.  Neuro: MS is good with appropriate affect, pt is alert and Ox3  DM Foot Exam 09/11/2020 The skin of the feet is without sores or ulcerations but with thickened discolored nail  The pedal pulses are 2+ on right and 2+ on left. The sensation is intact to a screening 5.07, 10 gram monofilament bilaterally     DATA REVIEWED:  Lab Results  Component Value Date   HGBA1C 7.3 (H) 09/06/2020   HGBA1C 9.3 (H) 05/31/2020   HGBA1C 8.3 (H) 02/07/2020   Lab Results  Component Value Date   MICROALBUR 1.7 05/31/2020   LDLCALC 96 09/06/2020   CREATININE 0.98 09/06/2020   Lab Results  Component Value Date   MICRALBCREAT 1.9 05/31/2020     Lab Results  Component Value Date   CHOL 157 09/06/2020   HDL 34 (L) 09/06/2020   LDLCALC 96 09/06/2020   LDLDIRECT 93.0 05/31/2020   TRIG 174 (H) 09/06/2020   CHOLHDL 4.6 09/06/2020         ASSESSMENT / PLAN / RECOMMENDATIONS:   1) Type 2 Diabetes Mellitus, Sub-Optimally controlled, With Neuropathic complications - Most recent A1c of 7.3 %. Goal A1c < 7.0 %.    - A1c down from 9.3%  - Will increase metformin and continue Glimepiride as below  - This am BG was 125 mg/dL, but after drinking coffee with creamer BG is currently at 180 mg/dL  - Pt is requesting a referral to podiatry, had one in 01/2020 they contacted him twice but he did not call back, will renew    MEDICATIONS:  - Continue  Glimepiride 4 mg , 1 tablet before breakfast  - Increase Metformin 500 mg, TWO tablet with Breakfast and ONE tablet with supper   EDUCATION / INSTRUCTIONS:  BG monitoring instructions: Patient is  instructed to check his blood sugars 1 times a day  Call [facility] if: BG persistently < 70  . I reviewed the Rule of 15 for the treatment of hypoglycemia in detail with the patient. Literature supplied.   2) Diabetic complications:   Eye: Does not have known diabetic retinopathy.   Neuro/ Feet: Does have known diabetic peripheral neuropathy .   Renal: Patient does not have known baseline CKD. He   is on an ACEI/ARB at present.     F/U in 4 months    Signed electronically by: Mack Guise, MD  [facility] Endocrinology  [facility] [address] [place], [place] [address] Phone: [phone] FAX: [phone]   CC: [address] [place] [address] Phone: [phone]  Fax: [phone]  Return to Endocrinology clinic as below: Future Appointments  Date Time Provider [place]  10/02/2020  2:40 PM Tobb, Godfrey Pick, DO [facility] None  03/07/2021 11:00 AM [name], DO [id number]

## 2020-09-11 NOTE — Patient Instructions (Signed)
-   Keep up the Good Work ! - Continue  Glimepiride 4 mg , 1 tablet before breakfast  - Increase Metformin 500 mg, TWO tablet with Breakfast and ONE tablet with supper      HOW TO TREAT LOW BLOOD SUGARS (Blood sugar LESS THAN 70 MG/DL)  Please follow the RULE OF 15 for the treatment of hypoglycemia treatment (when your (blood sugars are less than 70 mg/dL)    STEP 1: Take 15 grams of carbohydrates when your blood sugar is low, which includes:   3-4 GLUCOSE TABS  OR  3-4 OZ OF JUICE OR REGULAR SODA OR  ONE TUBE OF GLUCOSE GEL     STEP 2: RECHECK blood sugar in 15 MINUTES STEP 3: If your blood sugar is still low at the 15 minute recheck --> then, go back to STEP 1 and treat AGAIN with another 15 grams of carbohydrates.

## 2020-09-17 MED FILL — GLIMEPIRIDE 4 MG TABLET: 4 | 90 days supply | Qty: 90 | Fill #1

## 2020-09-17 MED FILL — OMEPRAZOLE 40 MG CPDR: 40 | 90 days supply | Qty: 180 | Fill #1

## 2020-09-17 MED FILL — GABAPENTIN 600 MG TABLET: 600 | 30 days supply | Qty: 90 | Fill #5

## 2020-09-24 ENCOUNTER — Other Ambulatory Visit: Payer: Self-pay | Admitting: Family Medicine

## 2020-09-24 DIAGNOSIS — G8929 Other chronic pain: Secondary | ICD-10-CM

## 2020-09-24 MED FILL — FENOFIBRATE 160 MG TABLET: 160 | 90 days supply | Qty: 90 | Fill #0

## 2020-09-24 MED FILL — OXYCODONE-ACETAMINOPHEN 5-3: 5-325 | 23 days supply | Qty: 90 | Fill #0

## 2020-09-24 NOTE — Telephone Encounter (Signed)
Oxycodone refill.   Last OV: 09/06/2020 Last Fill: 08/20/2020 #90 and 0RF Pt sig: 1 tab q6h prn UDS: 11/14/2019 Low risk

## 2020-09-25 ENCOUNTER — Ambulatory Visit (INDEPENDENT_AMBULATORY_CARE_PROVIDER_SITE_OTHER): Payer: Medicare HMO | Admitting: Podiatry

## 2020-09-25 ENCOUNTER — Other Ambulatory Visit: Payer: Self-pay

## 2020-09-25 ENCOUNTER — Encounter: Payer: Self-pay | Admitting: Podiatry

## 2020-09-25 ENCOUNTER — Ambulatory Visit (INDEPENDENT_AMBULATORY_CARE_PROVIDER_SITE_OTHER): Payer: Medicare HMO

## 2020-09-25 DIAGNOSIS — M79673 Pain in unspecified foot: Secondary | ICD-10-CM | POA: Diagnosis not present

## 2020-09-25 DIAGNOSIS — E1142 Type 2 diabetes mellitus with diabetic polyneuropathy: Secondary | ICD-10-CM

## 2020-09-25 MED ORDER — GABAPENTIN 800 MG PO TABS
800.0000 mg | ORAL_TABLET | Freq: Three times a day (TID) | ORAL | 2 refills | Status: DC
  Filled 2021-05-21: qty 270, 90d supply, fill #0
  Filled 2021-08-23: qty 270, 90d supply, fill #1

## 2020-09-26 NOTE — Progress Notes (Signed)
Subjective:  Patient ID: ORI TREJOS, male    DOB: [birth year],  MRN: [id number] HPI Chief Complaint  Patient presents with  . Foot Pain    Plantar feet bilateral - feels like walking on "ball bearings" x few years, burning, numbness, tingling at different times, takes regular doses of pain meds-helps some  . Diabetes    Last a1c was 7.3  . New Patient (Initial Visit)    64 y.o. male presents with the above complaint.   ROS: Denies fever chills nausea vomiting muscle aches pains calf pain back pain chest pain shortness of breath.  Past Medical History:  Diagnosis Date  . Anxiety   . Arthritis   . Cancer ([place])   . Complication of anesthesia    aspirated with back surgery at age 30  . Depression   . Diabetes mellitus   . GERD (gastroesophageal reflux disease)   . Hyperlipidemia   . Hypertension   . Neuromuscular disorder ([place])    NEUROPATHY  . Right rotator cuff tear 11/23/2018  . Sleep apnea    no CPAP   Past Surgical History:  Procedure Laterality Date  . APPENDECTOMY    . ARTHOSCOPIC ROTAOR CUFF REPAIR Right 11/23/2018   Procedure: ARTHROSCOPIC ROTATOR CUFF REPAIR;  Surgeon: [name], MD;  Location: [facility];  Service: Orthopedics;  Laterality: Right;  . CHOLECYSTECTOMY    . LUMBAR LAMINECTOMY    . SHOULDER ARTHROSCOPY WITH ROTATOR CUFF REPAIR AND SUBACROMIAL DECOMPRESSION Right 11/23/2018   Procedure: SHOULDER ARTHROSCOPY WITH ROTATOR CUFF REPAIR AND SUBACROMIAL DECOMPRESSION;  Surgeon: [name], MD;  Location: [facility];  Service: Orthopedics;  Laterality: Right;    Current Outpatient Medications:  .  BAYER CONTOUR TEST test strip, Check Blood sugar once daily. Dx E11.9, Disp: 50 each, Rfl: 12 .  BAYER MICROLET LANCETS lancets, Check Blood sugar once daily. Dx E11.9, Disp: 50 each, Rfl: 11 .  fenofibrate 160 MG tablet, Take 1 tablet (160 mg total) by mouth daily., Disp: 90 tablet, Rfl: 1 .  gabapentin (NEURONTIN)  800 MG tablet, Take 1 tablet (800 mg total) by mouth 3 (three) times daily., Disp: 270 tablet, Rfl: 3 .  glimepiride ([name]) 4 MG tablet, Take 1 tablet (4 mg total) by mouth daily before breakfast., Disp: 90 tablet, Rfl: 3 .  glucose blood test strip, Use as instructed, Disp: 100 each, Rfl: 12 .  lisinopril (ZESTRIL) 5 MG tablet, TAKE 1 TABLET (5 MG TOTAL) BY MOUTH DAILY., Disp: 90 tablet, Rfl: 1 .  metFORMIN ([name]) 500 MG 24 hr tablet, Take 2 tablets (1,000 mg total) by mouth daily with breakfast AND 1 tablet (500 mg total) daily with supper., Disp: 270 tablet, Rfl: 3 .  omeprazole (PRILOSEC) 40 MG capsule, 1 po bid, Disp: 180 capsule, Rfl: 3 .  ondansetron (ZOFRAN) 4 MG tablet, Take 1 tablet (4 mg total) by mouth every 8 (eight) hours as needed., Disp: 20 tablet, Rfl: 2 .  oxyCODONE-acetaminophen (PERCOCET/ROXICET) 5-325 MG tablet, TAKE 1 TABLET BY MOUTH EVERY 6 HOURS AS NEEDED, Disp: 90 tablet, Rfl: 0 .  rosuvastatin (CRESTOR) 20 MG tablet, Take 1 tablet (20 mg total) by mouth daily., Disp: 30 tablet, Rfl: 2 .  sennosides-docusate sodium (SENOKOT-S) 8.6-50 MG tablet, Take 2 tablets by mouth daily., Disp: 30 tablet, Rfl: 1 .  sertraline (ZOLOFT) 100 MG tablet, Take 1 tablet (100 mg total) by mouth daily., Disp: 90 tablet, Rfl: 1 .  tiZANidine (ZANAFLEX) 4 MG tablet, TAKE  1 TABLET (4 MG TOTAL) BY MOUTH EVERY 6 (SIX) HOURS AS NEEDED FOR MUSCLE SPASMS., Disp: 30 tablet, Rfl: 1 .  traZODone (DESYREL) 50 MG tablet, Take 0.5-1 tablets (25-50 mg total) by mouth at bedtime as needed for sleep., Disp: 30 tablet, Rfl: 3  Allergies  Allergen Reactions  . Niacin Anaphylaxis   Review of Systems Objective:  There were no vitals filed for this visit.  General: Well developed, nourished, in no acute distress, alert and oriented x3   Dermatological: Skin is warm, dry and supple bilateral. Nails x 10 are well maintained; remaining integument appears unremarkable at this time. There are no open  sores, no preulcerative lesions, no rash or signs of infection present.  Vascular: Dorsalis Pedis artery and Posterior Tibial artery pedal pulses are 2/4 bilateral with immedate capillary fill time. Pedal hair growth present. No varicosities and no lower extremity edema present bilateral.   Neruologic: Grossly intact via light touch bilateral. Vibratory intact via tuning fork bilateral. Protective threshold with Semmes Wienstein monofilament intact to all pedal sites bilateral. Patellar and Achilles deep tendon reflexes 2+ bilateral. No Babinski or clonus noted bilateral.   Musculoskeletal: No gross boney pedal deformities bilateral. No pain, crepitus, or limitation noted with foot and ankle range of motion bilateral. Muscular strength 5/5 in all groups tested bilateral.  Mild digital deformity flexible in nature  Gait: Unassisted, Nonantalgic.    Radiographs:  Radiographs taken today demonstrate osseously mature individual soft tissue increase in density plantar fascial kidney insertion sites and tolerating posterior calcaneal heel spurs otherwise the foot appears to be rectus.  No acute findings are noted.  Assessment & Plan:   Assessment: Diabetic peripheral neuropathy mild digital deformities no preulcerative lesions  Plan: Requesting diabetic shoes which she has had in the past however increase his gabapentin to 800 mg 3 times a day #270 with 3 refills I will follow-up with him in a month or so just to make sure his medications are accurate     Abri Vacca T. [place], [place]

## 2020-09-27 MED FILL — GABAPENTIN 800 MG TABS: 800 | 90 days supply | Qty: 270 | Fill #0

## 2020-09-28 ENCOUNTER — Other Ambulatory Visit: Payer: Self-pay

## 2020-09-28 ENCOUNTER — Ambulatory Visit: Payer: Medicare HMO | Admitting: Orthotics

## 2020-09-28 DIAGNOSIS — E1140 Type 2 diabetes mellitus with diabetic neuropathy, unspecified: Secondary | ICD-10-CM

## 2020-09-28 NOTE — Progress Notes (Signed)

## 2020-10-01 MED FILL — traZODone HCL 50 MG TABS: 50 | 30 days supply | Qty: 30 | Fill #1

## 2020-10-02 ENCOUNTER — Ambulatory Visit (INDEPENDENT_AMBULATORY_CARE_PROVIDER_SITE_OTHER): Payer: Medicare HMO | Admitting: Cardiology

## 2020-10-02 ENCOUNTER — Other Ambulatory Visit: Payer: Self-pay

## 2020-10-02 ENCOUNTER — Encounter: Payer: Self-pay | Admitting: Cardiology

## 2020-10-02 ENCOUNTER — Other Ambulatory Visit: Payer: Self-pay | Admitting: *Deleted

## 2020-10-02 ENCOUNTER — Ambulatory Visit (INDEPENDENT_AMBULATORY_CARE_PROVIDER_SITE_OTHER): Payer: Medicare HMO

## 2020-10-02 VITALS — BP 110/68 | HR 61 | Ht 72.0 in | Wt 221.8 lb

## 2020-10-02 DIAGNOSIS — E1169 Type 2 diabetes mellitus with other specified complication: Secondary | ICD-10-CM

## 2020-10-02 DIAGNOSIS — E785 Hyperlipidemia, unspecified: Secondary | ICD-10-CM | POA: Diagnosis not present

## 2020-10-02 DIAGNOSIS — I10 Essential (primary) hypertension: Secondary | ICD-10-CM

## 2020-10-02 DIAGNOSIS — R0602 Shortness of breath: Secondary | ICD-10-CM | POA: Diagnosis not present

## 2020-10-02 DIAGNOSIS — E669 Obesity, unspecified: Secondary | ICD-10-CM | POA: Diagnosis not present

## 2020-10-02 DIAGNOSIS — R079 Chest pain, unspecified: Secondary | ICD-10-CM

## 2020-10-02 DIAGNOSIS — E1165 Type 2 diabetes mellitus with hyperglycemia: Secondary | ICD-10-CM | POA: Diagnosis not present

## 2020-10-02 DIAGNOSIS — G4733 Obstructive sleep apnea (adult) (pediatric): Secondary | ICD-10-CM

## 2020-10-02 DIAGNOSIS — R42 Dizziness and giddiness: Secondary | ICD-10-CM

## 2020-10-02 DIAGNOSIS — R5383 Other fatigue: Secondary | ICD-10-CM

## 2020-10-02 MED ORDER — METOPROLOL TARTRATE 100 MG PO TABS: 100.0000 mg | ORAL_TABLET | Freq: Once | ORAL | 0 refills | Status: DC

## 2020-10-02 MED ORDER — NITROGLYCERIN 0.4 MG SL SUBL: 0.4000 mg | SUBLINGUAL_TABLET | SUBLINGUAL | 3 refills | Status: DC | PRN

## 2020-10-02 MED FILL — NITROGLYCERIN 0.4 MG TAB SL: 0.4 | 75 days supply | Qty: 75 | Fill #0

## 2020-10-02 MED FILL — METOPROLOL TARTRATE 100 MG: 100 | 1 days supply | Qty: 1 | Fill #0

## 2020-10-02 NOTE — Patient Instructions (Signed)
Medication Instructions:  Your physician has recommended you make the following change in your medication:  START: Nitroglycerin 0.4 mg take one tablet by mouth every 5 minutes as needed for chest pain. *If you need a refill on your cardiac medications before your next appointment, please call your pharmacy*   Lab Work: Your physician recommends that you return for lab work in: TODAY TSH  Within one week of your cardiac CT: BMP If you have labs (blood work) drawn today and your tests are completely normal, you will receive your results only by: Marland Kitchen MyChart Message (if you have MyChart) OR . A paper copy in the mail If you have any lab test that is abnormal or we need to change your treatment, we will call you to review the results.   Testing/Procedures: Your physician has requested that you have an echocardiogram. Echocardiography is a painless test that uses sound waves to create images of your heart. It provides your doctor with information about the size and shape of your heart and how well your heart's chambers and valves are working. This procedure takes approximately one hour. There are no restrictions for this procedure.  A zio monitor was ordered today. It will remain on for 7 days. You will then return monitor and event diary in provided box. It takes 1-2 weeks for report to be downloaded and returned to [place]. We will call you with the results. If monitor falls off or has orange flashing light, please call Zio for further instructions.   Your cardiac CT will be scheduled at the below location:   [facility] [address] [place], [place] [address] [phone]   If scheduled at [facility], please arrive at the [facility] main entrance of [facility] 30 minutes prior to test start time. Proceed to the [facility] Radiology Department (first floor) to check-in and test prep.   Please follow these instructions carefully (unless otherwise  directed):  Hold all erectile dysfunction medications at least 3 days (72 hrs) prior to test.  On the Night Before the Test: . Be sure to Drink plenty of water. . Do not consume any caffeinated/decaffeinated beverages or chocolate 12 hours prior to your test. . Do not take any antihistamines 12 hours prior to your test.  On the Day of the Test: . Drink plenty of water. Do not drink any water within one hour of the test. . Do not eat any food 4 hours prior to the test. . You may take your regular medications prior to the test.  . Take metoprolol (Lopressor) two hours prior to test.      After the Test: . Drink plenty of water. . After receiving IV contrast, you may experience a mild flushed feeling. This is normal. . On occasion, you may experience a mild rash up to 24 hours after the test. This is not dangerous. If this occurs, you can take Benadryl 25 mg and increase your fluid intake. . If you experience trouble breathing, this can be serious. If it is severe call 911 IMMEDIATELY. If it is mild, please call our office. . If you take any of these medications: Glipizide/Metformin, Avandament, Glucavance, please do not take 48 hours after completing test unless otherwise instructed.   Once we have confirmed authorization from your insurance company, we will call you to set up a date and time for your test. Based on how quickly your insurance processes prior authorizations requests, please allow up to 4  weeks to be contacted for scheduling your Cardiac CT appointment. Be advised that routine Cardiac CT appointments could be scheduled as many as 8 weeks after your provider has ordered it.  For non-scheduling related questions, please contact the cardiac imaging nurse navigator should you have any questions/concerns: [name], Cardiac Imaging Nurse Navigator Burley Saver, Interim Cardiac Imaging Nurse [place] [facility] Direct Office Dial: [phone]   For  scheduling needs, including cancellations and rescheduling, please call Vivien Rota at [phone], option 3.        Follow-Up: At [facility], you and your health needs are our priority.  As part of our continuing mission to provide you with exceptional heart care, we have created designated Provider Care Teams.  These Care Teams include your primary Cardiologist (physician) and Advanced Practice Providers (APPs -  Physician Assistants and Nurse Practitioners) who all work together to provide you with the care you need, when you need it.  We recommend signing up for the patient portal called "MyChart".  Sign up information is provided on this After Visit Summary.  MyChart is used to connect with patients for Virtual Visits (Telemedicine).  Patients are able to view lab/test results, encounter notes, upcoming appointments, etc.  Non-urgent messages can be sent to your provider as well.   To learn more about what you can do with MyChart, go to [email or web address].    Your next appointment:   3 month(s)  The format for your next appointment:   In Person  Provider:   Berniece Salines, DO   Other Instructions

## 2020-10-02 NOTE — Progress Notes (Signed)
Cardiology Office Note:    Date:  10/02/2020   ID:  Ronald Petersen, DOB Sep 17, 1956, MRN [id number]  PCP:  [name], Alferd Apa, DO  Cardiologist:  Berniece Salines, DO  Electrophysiologist:  None   Referring MD: [name], Alferd Apa, *   " I am here to establish cardiac care"  History of Present Illness:    Ronald Petersen is a 64 y.o. male with a hx of diabetes mellitus, hypertension, hyperlipidemia, obesity, family history of premature coronary artery disease, obstructive sleep apnea patient does not use his CPAP presents today to be evaluated for intermittent chest pain, shortness of breath as well as worsening fatigue.  The patient tells me that over the last several months he has had left-sided intermittent chest pain.  He described as a dull sensation.  He notes that nothing makes it better or worse.  But he feels as if it is getting frequent in episodes.  In addition he has had associated shortness of breath.  But the most bothersome symptom is the fact that he feels significantly fatigue over the last couple months and nothing he does help him feel better.  He noticed also that his heart rate has been low recently and usually when this happened he feels significant lightheaded and dizziness and feels as if he is going to pass out.  In 2006 the patient was evaluated by cardiology at that time he had an exercise nuclear stress test done which showed no evidence of ischemia.  Ejection fraction reported to be 67 at the time.  Past Medical History:  Diagnosis Date  . Anxiety   . Arthritis   . Cancer ([place])   . Complication of anesthesia    aspirated with back surgery at age 14  . Depression   . Diabetes mellitus   . GERD (gastroesophageal reflux disease)   . Hyperlipidemia   . Hypertension   . Neuromuscular disorder ([place])    NEUROPATHY  . Right rotator cuff tear 11/23/2018  . Sleep apnea    no CPAP    Past Surgical History:  Procedure Laterality Date  . APPENDECTOMY    .  ARTHOSCOPIC ROTAOR CUFF REPAIR Right 11/23/2018   Procedure: ARTHROSCOPIC ROTATOR CUFF REPAIR;  Surgeon: [name], MD;  Location: [facility];  Service: Orthopedics;  Laterality: Right;  . CHOLECYSTECTOMY    . LUMBAR LAMINECTOMY    . SHOULDER ARTHROSCOPY WITH ROTATOR CUFF REPAIR AND SUBACROMIAL DECOMPRESSION Right 11/23/2018   Procedure: SHOULDER ARTHROSCOPY WITH ROTATOR CUFF REPAIR AND SUBACROMIAL DECOMPRESSION;  Surgeon: [name], MD;  Location: [facility];  Service: Orthopedics;  Laterality: Right;    Current Medications: Current Meds  Medication Sig  . fenofibrate 160 MG tablet Take 1 tablet (160 mg total) by mouth daily.  Marland Kitchen gabapentin (NEURONTIN) 800 MG tablet Take 1 tablet (800 mg total) by mouth 3 (three) times daily.  Marland Kitchen glimepiride ([name]) 4 MG tablet Take 1 tablet (4 mg total) by mouth daily before breakfast.  . glucose blood test strip Use as instructed  . lisinopril (ZESTRIL) 5 MG tablet TAKE 1 TABLET (5 MG TOTAL) BY MOUTH DAILY.  . metFORMIN ([name]) 500 MG 24 hr tablet Take 2 tablets (1,000 mg total) by mouth daily with breakfast AND 1 tablet (500 mg total) daily with supper.  Marland Kitchen omeprazole (PRILOSEC) 40 MG capsule 1 po bid  . ondansetron (ZOFRAN) 4 MG tablet Take 1 tablet (4 mg total) by mouth every 8 (eight) hours as  needed.  Marland Kitchen oxyCODONE-acetaminophen (PERCOCET/ROXICET) 5-325 MG tablet TAKE 1 TABLET BY MOUTH EVERY 6 HOURS AS NEEDED  . rosuvastatin (CRESTOR) 20 MG tablet Take 1 tablet (20 mg total) by mouth daily.  . sennosides-docusate sodium (SENOKOT-S) 8.6-50 MG tablet Take 2 tablets by mouth daily.  . sertraline (ZOLOFT) 100 MG tablet Take 1 tablet (100 mg total) by mouth daily.  Marland Kitchen tiZANidine (ZANAFLEX) 4 MG tablet TAKE 1 TABLET (4 MG TOTAL) BY MOUTH EVERY 6 (SIX) HOURS AS NEEDED FOR MUSCLE SPASMS.  Marland Kitchen traZODone (DESYREL) 50 MG tablet Take 0.5-1 tablets (25-50 mg total) by mouth at bedtime as needed for sleep.     Allergies:    Niacin   Social History   Socioeconomic History  . Marital status: Married    Spouse name: Lorriane Shire  . Number of children: 2  . Years of education: Not on file  . Highest education level: Not on file  Occupational History  . Occupation: self employed    Fish farm manager: UNEMPLOYED  Tobacco Use  . Smoking status: Former Smoker    Years: 16.00    Types: Cigarettes    Quit date: 12/29/1985    Years since quitting: 34.7  . Smokeless tobacco: Never Used  Vaping Use  . Vaping Use: Never used  Substance and Sexual Activity  . Alcohol use: Yes    Alcohol/week: 4.0 - 5.0 standard drinks    Types: 4 - 5 Cans of beer per week    Comment: 5 times per week  . Drug use: No  . Sexual activity: Yes    Partners: Female  Other Topics Concern  . Not on file  Social History Narrative   Exercise-- 3 days   Pt has hs degree   Social Determinants of Health   Financial Resource Strain:   . Difficulty of Paying Living Expenses: Not on file  Food Insecurity:   . Worried About Charity fundraiser in the Last Year: Not on file  . Ran Out of Food in the Last Year: Not on file  Transportation Needs:   . Lack of Transportation (Medical): Not on file  . Lack of Transportation (Non-Medical): Not on file  Physical Activity:   . Days of Exercise per Week: Not on file  . Minutes of Exercise per Session: Not on file  Stress:   . Feeling of Stress : Not on file  Social Connections:   . Frequency of Communication with Friends and Family: Not on file  . Frequency of Social Gatherings with Friends and Family: Not on file  . Attends Religious Services: Not on file  . Active Member of Clubs or Organizations: Not on file  . Attends Archivist Meetings: Not on file  . Marital Status: Not on file     Family History: The patient's family history includes Alcohol abuse in an other family member; Arthritis in an other family member; COPD in his mother; Colon cancer in an other family member; Coronary  artery disease in an other family member; Depression in an other family member; Heart disease in his sister and sister; Hypertension in an other family member; Ovarian cancer in his sister and other family members; Stomach cancer in his maternal grandmother and sister; Stroke in his father.  ROS:   Review of Systems  Constitution: Negative for decreased appetite, fever and weight gain.  HENT: Negative for congestion, ear discharge, hoarse voice and sore throat.   Eyes: Negative for discharge, redness, vision loss in right eye and  visual halos.  Cardiovascular: Negative for chest pain, dyspnea on exertion, leg swelling, orthopnea and palpitations.  Respiratory: Negative for cough, hemoptysis, shortness of breath and snoring.   Endocrine: Negative for heat intolerance and polyphagia.  Hematologic/Lymphatic: Negative for bleeding problem. Does not bruise/bleed easily.  Skin: Negative for flushing, nail changes, rash and suspicious lesions.  Musculoskeletal: Negative for arthritis, joint pain, muscle cramps, myalgias, neck pain and stiffness.  Gastrointestinal: Negative for abdominal pain, bowel incontinence, diarrhea and excessive appetite.  Genitourinary: Negative for decreased libido, genital sores and incomplete emptying.  Neurological: Negative for brief paralysis, focal weakness, headaches and loss of balance.  Psychiatric/Behavioral: Negative for altered mental status, depression and suicidal ideas.  Allergic/Immunologic: Negative for HIV exposure and persistent infections.    EKGs/Labs/Other Studies Reviewed:    The following studies were reviewed today:   EKG:  The ekg ordered today demonstrates sinus rhythm, heart rate 61 bpm.  Recent Labs: 02/07/2020: Hemoglobin 14.8; Platelets 193.0 09/06/2020: ALT 24; BUN 20; Creat 0.98; Potassium 4.9; Sodium 139  Recent Lipid Panel    Component Value Date/Time   CHOL 157 09/06/2020 1138   TRIG 174 (H) 09/06/2020 1138   TRIG 148 12/10/2006  1603   HDL 34 (L) 09/06/2020 1138   CHOLHDL 4.6 09/06/2020 1138   VLDL 53.6 (H) 05/31/2020 0910   LDLCALC 96 09/06/2020 1138   LDLDIRECT 93.0 05/31/2020 0910    Physical Exam:    VS:  BP 110/68 (BP Location: Right Arm, Patient Position: Sitting, Cuff Size: Normal)   Pulse 61   Ht 6' (1.829 m)   Wt 221 lb 12.8 oz (100.6 kg)   SpO2 97%   BMI 30.08 kg/m     Wt Readings from Last 3 Encounters:  10/02/20 221 lb 12.8 oz (100.6 kg)  09/11/20 223 lb 9.6 oz (101.4 kg)  09/06/20 219 lb 12.8 oz (99.7 kg)     GEN: Well nourished, well developed in no acute distress HEENT: Normal NECK: No JVD; No carotid bruits LYMPHATICS: No lymphadenopathy CARDIAC: S1S2 noted,RRR, no murmurs, rubs, gallops RESPIRATORY:  Clear to auscultation without rales, wheezing or rhonchi  ABDOMEN: Soft, non-tender, non-distended, +bowel sounds, no guarding. EXTREMITIES: No edema, No cyanosis, no clubbing MUSCULOSKELETAL:  No deformity  SKIN: Warm and dry NEUROLOGIC:  Alert and oriented x 3, non-focal PSYCHIATRIC:  Normal affect, good insight  ASSESSMENT:    1. Chest pain of uncertain etiology   2. Fatigue, unspecified type   3. Dizziness   4. Shortness of breath   5. Uncontrolled type 2 diabetes mellitus with hyperglycemia ([place])   6. Hyperlipidemia associated with type 2 diabetes mellitus ([place])   7. OSA (obstructive sleep apnea)   8. Essential hypertension   9. Obesity (BMI 30-39.9)    PLAN:     I am concerned about his chest pain.  He does have a intermediate to high risk factor for coronary artery disease therefore like to proceed with an ischemic evaluation in this patient.  A coronary CTA will be appropriate.  Discussed with the patient.  In the same setting he does have a systolic murmur.  Which I am going to get an echocardiogram for.  If he has severe valvular disease with move on with a left heart catheterization at that time.    Sublingual nitroglycerin prescription was sent, its protocol and  911 protocol explained and the patient vocalized understanding questions were answered to the patient's satisfaction  In terms of his dizziness and intermittent low heart rate will place  a monitor the patient for about 7 days to understand his baseline heart rhythm in these times.  TSH will be done to assess thyroid function.  I was able to review his recent lipid profile done by his PCP I will make no changes at this time to his lowering medication.  He will remain on Crestor as well as fenofibrate.  He follows with endocrine for his diabetes management.  The patient understands the need to lose weight with diet and exercise. We have discussed specific strategies for this.  The patient is in agreement with the above plan. The patient left the office in stable condition.  The patient will follow up in 3 months or sooner if needed.   Medication Adjustments/Labs and Tests Ordered: Current medicines are reviewed at length with the patient today.  Concerns regarding medicines are outlined above.  Orders Placed This Encounter  Procedures  . CT CORONARY MORPH W/CTA COR W/SCORE W/CA W/CM &/OR WO/CM  . CT CORONARY FRACTIONAL FLOW RESERVE DATA PREP  . CT CORONARY FRACTIONAL FLOW RESERVE FLUID ANALYSIS  . TSH  . Basic metabolic panel  . LONG TERM MONITOR (3-14 DAYS)  . ECHOCARDIOGRAM COMPLETE   Meds ordered this encounter  Medications  . nitroGLYCERIN (NITROSTAT) 0.4 MG SL tablet    Sig: Place 1 tablet (0.4 mg total) under the tongue every 5 (five) minutes as needed for chest pain.    Dispense:  90 tablet    Refill:  3  . metoprolol tartrate (LOPRESSOR) 100 MG tablet    Sig: Take 1 tablet (100 mg total) by mouth once for 1 dose. Take two hours prior to your cardiac CT    Dispense:  1 tablet    Refill:  0    Patient Instructions  Medication Instructions:  Your physician has recommended you make the following change in your medication:  START: Nitroglycerin 0.4 mg take one tablet by mouth  every 5 minutes as needed for chest pain. *If you need a refill on your cardiac medications before your next appointment, please call your pharmacy*   Lab Work: Your physician recommends that you return for lab work in: TODAY TSH  Within one week of your cardiac CT: BMP If you have labs (blood work) drawn today and your tests are completely normal, you will receive your results only by: Marland Kitchen MyChart Message (if you have MyChart) OR . A paper copy in the mail If you have any lab test that is abnormal or we need to change your treatment, we will call you to review the results.   Testing/Procedures: Your physician has requested that you have an echocardiogram. Echocardiography is a painless test that uses sound waves to create images of your heart. It provides your doctor with information about the size and shape of your heart and how well your heart's chambers and valves are working. This procedure takes approximately one hour. There are no restrictions for this procedure.  A zio monitor was ordered today. It will remain on for 7 days. You will then return monitor and event diary in provided box. It takes 1-2 weeks for report to be downloaded and returned to [place]. We will call you with the results. If monitor falls off or has orange flashing light, please call Zio for further instructions.   Your cardiac CT will be scheduled at the below location:   [facility] [address] [place], [place] [address] [phone]   If scheduled at [facility], please arrive  at the [facility] main entrance of [facility] 30 minutes prior to test start time. Proceed to the [facility] Radiology Department (first floor) to check-in and test prep.   Please follow these instructions carefully (unless otherwise directed):  Hold all erectile dysfunction medications at least 3 days (72 hrs) prior to test.  On the Night Before the Test: . Be sure to Drink plenty of water. . Do  not consume any caffeinated/decaffeinated beverages or chocolate 12 hours prior to your test. . Do not take any antihistamines 12 hours prior to your test.  On the Day of the Test: . Drink plenty of water. Do not drink any water within one hour of the test. . Do not eat any food 4 hours prior to the test. . You may take your regular medications prior to the test.  . Take metoprolol (Lopressor) two hours prior to test.      After the Test: . Drink plenty of water. . After receiving IV contrast, you may experience a mild flushed feeling. This is normal. . On occasion, you may experience a mild rash up to 24 hours after the test. This is not dangerous. If this occurs, you can take Benadryl 25 mg and increase your fluid intake. . If you experience trouble breathing, this can be serious. If it is severe call 911 IMMEDIATELY. If it is mild, please call our office. . If you take any of these medications: Glipizide/Metformin, Avandament, Glucavance, please do not take 48 hours after completing test unless otherwise instructed.   Once we have confirmed authorization from your insurance company, we will call you to set up a date and time for your test. Based on how quickly your insurance processes prior authorizations requests, please allow up to 4 weeks to be contacted for scheduling your Cardiac CT appointment. Be advised that routine Cardiac CT appointments could be scheduled as many as 8 weeks after your provider has ordered it.  For non-scheduling related questions, please contact the cardiac imaging nurse navigator should you have any questions/concerns: [name], Cardiac Imaging Nurse Navigator Burley Saver, Interim Cardiac Imaging Nurse [place] [facility] Direct Office Dial: [phone]   For scheduling needs, including cancellations and rescheduling, please call Vivien Rota at [phone], option 3.        Follow-Up: At [facility], you and your health  needs are our priority.  As part of our continuing mission to provide you with exceptional heart care, we have created designated Provider Care Teams.  These Care Teams include your primary Cardiologist (physician) and Advanced Practice Providers (APPs -  Physician Assistants and Nurse Practitioners) who all work together to provide you with the care you need, when you need it.  We recommend signing up for the patient portal called "MyChart".  Sign up information is provided on this After Visit Summary.  MyChart is used to connect with patients for Virtual Visits (Telemedicine).  Patients are able to view lab/test results, encounter notes, upcoming appointments, etc.  Non-urgent messages can be sent to your provider as well.   To learn more about what you can do with MyChart, go to [email or web address].    Your next appointment:   3 month(s)  The format for your next appointment:   In Person  Provider:   Berniece Salines, DO   Other Instructions      Adopting a Healthy Lifestyle.  Know what a healthy weight is for you (roughly BMI <25) and aim to maintain  this   Aim for 7+ servings of fruits and vegetables daily   65-80+ fluid ounces of water or unsweet tea for healthy kidneys   Limit to max 1 drink of alcohol per day; avoid smoking/tobacco   Limit animal fats in diet for cholesterol and heart health - choose grass fed whenever available   Avoid highly processed foods, and foods high in saturated/trans fats   Aim for low stress - take time to unwind and care for your mental health   Aim for 150 min of moderate intensity exercise weekly for heart health, and weights twice weekly for bone health   Aim for 7-9 hours of sleep daily   When it comes to diets, agreement about the perfect plan isnt easy to find, even among the experts. Experts at the [employer] developed an idea known as the Healthy Eating Plate. Just imagine a plate divided into logical, healthy  portions.   The emphasis is on diet quality:   Load up on vegetables and fruits - one-half of your plate: Aim for color and variety, and remember that potatoes dont count.   Go for whole grains - one-quarter of your plate: Whole wheat, barley, wheat berries, quinoa, oats, brown rice, and foods made with them. If you want pasta, go with whole wheat pasta.   Protein power - one-quarter of your plate: Fish, chicken, beans, and nuts are all healthy, versatile protein sources. Limit red meat.   The diet, however, does go beyond the plate, offering a few other suggestions.   Use healthy plant oils, such as olive, canola, soy, corn, sunflower and peanut. Check the labels, and avoid partially hydrogenated oil, which have unhealthy trans fats.   If youre thirsty, drink water. Coffee and tea are good in moderation, but skip sugary drinks and limit milk and dairy products to one or two daily servings.   The type of carbohydrate in the diet is more important than the amount. Some sources of carbohydrates, such as vegetables, fruits, whole grains, and beans-are healthier than others.   Finally, stay active  Signed, Berniece Salines, DO  10/02/2020 3:37 PM    [facility]

## 2020-10-02 NOTE — Addendum Note (Signed)
Addended by: Jerl Santos R on: 10/02/2020 05:06 PM   Modules accepted: Orders

## 2020-10-03 LAB — TSH: TSH: 1.29 u[IU]/mL (ref 0.450–4.500)

## 2020-10-04 ENCOUNTER — Telehealth: Payer: Self-pay

## 2020-10-04 NOTE — Telephone Encounter (Signed)
Left message on patients voicemail to please return our call.   

## 2020-10-04 NOTE — Telephone Encounter (Signed)
-----   Message from Berniece Salines, DO sent at 10/03/2020  6:50 PM EDT ----- Tsh normal

## 2020-10-05 ENCOUNTER — Telehealth: Payer: Self-pay

## 2020-10-05 NOTE — Telephone Encounter (Signed)
Left message on patients voicemail to please return our call.   

## 2020-10-05 NOTE — Telephone Encounter (Signed)
-----   Message from Berniece Salines, DO sent at 10/03/2020  6:50 PM EDT ----- Tsh normal

## 2020-10-05 NOTE — Telephone Encounter (Signed)
Pt is returning call.  

## 2020-10-05 NOTE — Telephone Encounter (Signed)
Spoke with patient regarding results and recommendation.  Patient verbalizes understanding and is agreeable to plan of care. Advised patient to call back with any issues or concerns.  

## 2020-10-17 ENCOUNTER — Ambulatory Visit (HOSPITAL_BASED_OUTPATIENT_CLINIC_OR_DEPARTMENT_OTHER)
Admission: RE | Admit: 2020-10-17 | Discharge: 2020-10-17 | Disposition: A | Discharge status: Home / Self Care | Payer: Medicare HMO | Source: Ambulatory Visit | Attending: Cardiology | Admitting: Cardiology

## 2020-10-17 ENCOUNTER — Other Ambulatory Visit: Payer: Self-pay | Admitting: Family Medicine

## 2020-10-17 ENCOUNTER — Other Ambulatory Visit: Payer: Self-pay

## 2020-10-17 DIAGNOSIS — R5383 Other fatigue: Secondary | ICD-10-CM | POA: Diagnosis not present

## 2020-10-17 DIAGNOSIS — R0602 Shortness of breath: Secondary | ICD-10-CM | POA: Diagnosis not present

## 2020-10-17 DIAGNOSIS — R42 Dizziness and giddiness: Secondary | ICD-10-CM | POA: Diagnosis not present

## 2020-10-17 DIAGNOSIS — G8929 Other chronic pain: Secondary | ICD-10-CM

## 2020-10-17 DIAGNOSIS — R079 Chest pain, unspecified: Secondary | ICD-10-CM

## 2020-10-17 LAB — ECHOCARDIOGRAM COMPLETE
Area-P 1/2: 2.93 cm2
S' Lateral: 2.99 cm

## 2020-10-17 MED FILL — OXYCODONE-ACETAMINOPHEN 5-3: 5-325 | 23 days supply | Qty: 90 | Fill #0

## 2020-10-17 MED FILL — ROSUVASTATIN CALCIUM 20 MG: 20 | 30 days supply | Qty: 30 | Fill #1

## 2020-10-17 NOTE — Telephone Encounter (Signed)
Requesting: Oxycodone-acetaminophen 5-325mg  Contract: 11/14/2019 UDS: 11/14/2019 Low risk Last Visit: 09/06/2020  Next Visit: 03/07/2021  Last Refill: 09/24/2020 #90 and 0RF Pt sig: 1 tab q6h prn  Please Advise

## 2020-10-19 ENCOUNTER — Telehealth: Payer: Self-pay

## 2020-10-19 DIAGNOSIS — R42 Dizziness and giddiness: Secondary | ICD-10-CM | POA: Diagnosis not present

## 2020-10-19 NOTE — Telephone Encounter (Signed)
Tried calling patient. No answer and no voicemail set up for me to leave a message. 

## 2020-10-19 NOTE — Telephone Encounter (Signed)
-----   Message from Berniece Salines, DO sent at 10/19/2020  9:59 AM EDT ----- The echo showed that the heart is not fully relaxing like it should ( diastolic dysfunction) ,but otherwise normal. I will discuss it at the next office visit.

## 2020-10-23 ENCOUNTER — Telehealth: Payer: Self-pay

## 2020-10-23 DIAGNOSIS — H5203 Hypermetropia, bilateral: Secondary | ICD-10-CM | POA: Diagnosis not present

## 2020-10-23 DIAGNOSIS — H52209 Unspecified astigmatism, unspecified eye: Secondary | ICD-10-CM | POA: Diagnosis not present

## 2020-10-23 DIAGNOSIS — H524 Presbyopia: Secondary | ICD-10-CM | POA: Diagnosis not present

## 2020-10-23 NOTE — Telephone Encounter (Signed)
Left message on patients voicemail to please return our call.   

## 2020-10-23 NOTE — Telephone Encounter (Signed)
Patient returning call.

## 2020-10-23 NOTE — Telephone Encounter (Signed)
-----   Message from Berniece Salines, DO sent at 10/19/2020  9:59 AM EDT ----- The echo showed that the heart is not fully relaxing like it should ( diastolic dysfunction) ,but otherwise normal. I will discuss it at the next office visit.

## 2020-10-23 NOTE — Telephone Encounter (Signed)
Spoke with patient regarding results and recommendation.  Patient verbalizes understanding and is agreeable to plan of care. Advised patient to call back with any issues or concerns.  

## 2020-10-29 MED FILL — OXYCODONE-ACETAMINOPHEN 5-3: 5-325 | 23 days supply | Qty: 90 | Fill #0

## 2020-10-30 ENCOUNTER — Telehealth: Payer: Self-pay

## 2020-10-30 NOTE — Telephone Encounter (Signed)
Spoke with patient regarding results and recommendation.  Patient verbalizes understanding and is agreeable to plan of care. Advised patient to call back with any issues or concerns.  

## 2020-10-30 NOTE — Telephone Encounter (Signed)
Left message on patients voicemail to please return our call.   

## 2020-10-30 NOTE — Telephone Encounter (Signed)
Patient is returning phone call.  °

## 2020-10-30 NOTE — Telephone Encounter (Signed)
-----   Message from Berniece Salines, DO sent at 10/27/2020 12:02 AM EDT ----- Monitor normal.

## 2020-11-06 ENCOUNTER — Other Ambulatory Visit: Payer: Self-pay

## 2020-11-06 DIAGNOSIS — R5383 Other fatigue: Secondary | ICD-10-CM | POA: Diagnosis not present

## 2020-11-06 DIAGNOSIS — R42 Dizziness and giddiness: Secondary | ICD-10-CM

## 2020-11-06 DIAGNOSIS — R079 Chest pain, unspecified: Secondary | ICD-10-CM | POA: Diagnosis not present

## 2020-11-06 DIAGNOSIS — R0602 Shortness of breath: Secondary | ICD-10-CM | POA: Diagnosis not present

## 2020-11-07 LAB — BASIC METABOLIC PANEL
BUN/Creatinine Ratio: 26 — ABNORMAL HIGH (ref 10–24)
BUN: 26 mg/dL (ref 8–27)
CO2: 28 mmol/L (ref 20–29)
Calcium: 10.2 mg/dL (ref 8.6–10.2)
Chloride: 103 mmol/L (ref 96–106)
Creatinine, Ser: 0.99 mg/dL (ref 0.76–1.27)
GFR calc Af Amer: 92 mL/min/{1.73_m2} (ref >59)
GFR calc non Af Amer: 80 mL/min/{1.73_m2} (ref >59)
Glucose: 180 mg/dL — ABNORMAL HIGH (ref 65–99)
Potassium: 4.8 mmol/L (ref 3.5–5.2)
Sodium: 138 mmol/L (ref 134–144)

## 2020-11-12 ENCOUNTER — Telehealth (HOSPITAL_COMMUNITY): Payer: Self-pay | Admitting: Emergency Medicine

## 2020-11-12 NOTE — Telephone Encounter (Signed)
Attempted to call patient regarding upcoming cardiac CT appointment. Left message on voicemail with name and callback number [name] RN Navigator [facility] [phone] Office [phone] Cell   Left detailed message instructing to ONLY take the medication if HR >55bpm.. Last ECGs showed HR 50-61 Ronald Petersen

## 2020-11-13 ENCOUNTER — Ambulatory Visit (HOSPITAL_COMMUNITY)
Admission: RE | Admit: 2020-11-13 | Discharge: 2020-11-13 | Disposition: A | Discharge status: Home / Self Care | Payer: Medicare HMO | Source: Ambulatory Visit | Attending: Cardiology | Admitting: Cardiology

## 2020-11-13 ENCOUNTER — Encounter: Payer: Self-pay | Admitting: *Deleted

## 2020-11-13 ENCOUNTER — Other Ambulatory Visit: Payer: Self-pay

## 2020-11-13 DIAGNOSIS — Z006 Encounter for examination for normal comparison and control in clinical research program: Secondary | ICD-10-CM

## 2020-11-13 DIAGNOSIS — R42 Dizziness and giddiness: Secondary | ICD-10-CM | POA: Diagnosis present

## 2020-11-13 DIAGNOSIS — R5383 Other fatigue: Secondary | ICD-10-CM | POA: Diagnosis present

## 2020-11-13 DIAGNOSIS — R079 Chest pain, unspecified: Secondary | ICD-10-CM | POA: Diagnosis present

## 2020-11-13 DIAGNOSIS — I2510 Atherosclerotic heart disease of native coronary artery without angina pectoris: Secondary | ICD-10-CM | POA: Diagnosis not present

## 2020-11-13 DIAGNOSIS — R0602 Shortness of breath: Secondary | ICD-10-CM | POA: Diagnosis not present

## 2020-11-13 IMAGING — CT CT HEART MORP W/ CTA COR W/ SCORE W/ CA W/CM &/OR W/O CM
4 of 7 series · 8 of 20 positions shown, 9 images · IV contrast (APPLIED)
Comparison: None.
COMPARISON: None.

Addendum:
EXAM:
OVER-READ INTERPRETATION  CT CHEST

The following report is an over-read performed by radiologist Dr.
Dorris Pfaff [REDACTED] on 11/13/2020. This
over-read does not include interpretation of cardiac or coronary
anatomy or pathology. The coronary calcium score/coronary CTA
interpretation by the cardiologist is attached.
CLINICAL DATA: This is a 64 year old male with chest pain. Medical
history includes Hypertension, Hyperlipidemia, Type 2 Diabetes and
Family history of premature CAD.
Cardiac/Coronary  CT
TECHNIQUE: The patient was scanned on a [employer] Force scanner.

[Series 6: best diast 72 % · axial · 0.39mm/px · z∈[+1300,+1343]mm · 2 of 318 slices shown, 3 images]
[im 106/318  vessel]
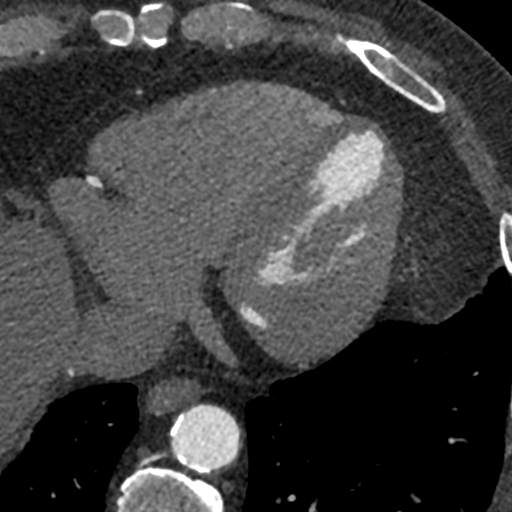
[im 106/318  lung]
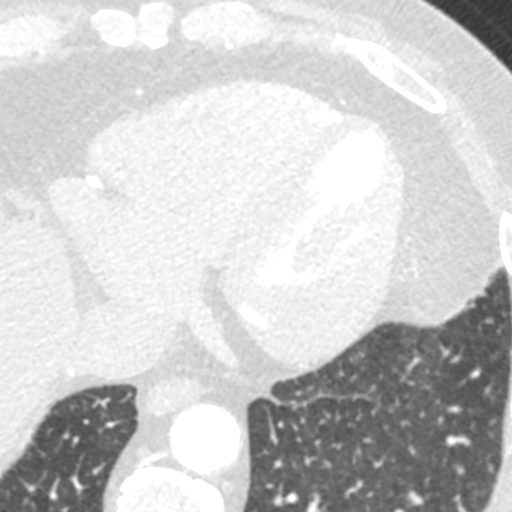
[im 212/318  vessel]
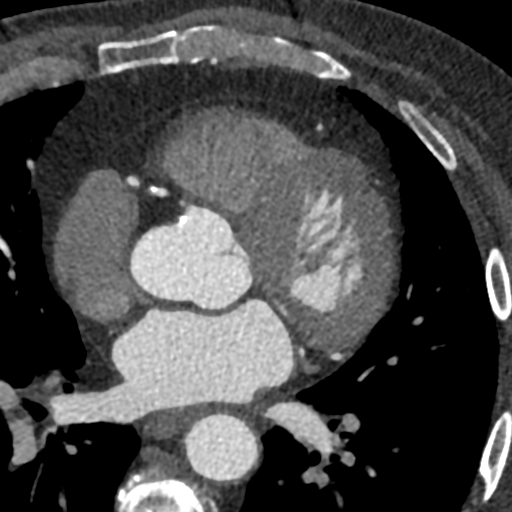

[Series 7: best syst 37 % · axial · 0.39mm/px · z∈[+1300,+1343]mm · 2 of 318 slices shown]
[im 106/318  vessel]
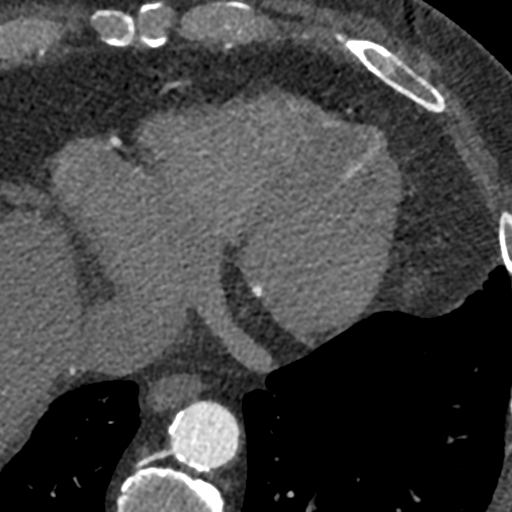
[im 212/318  vessel]
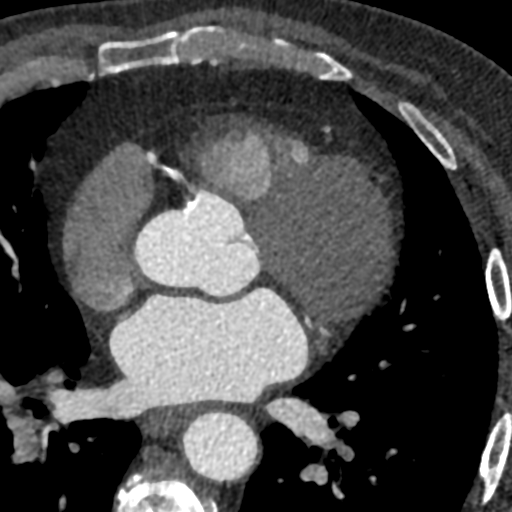

[Series 8: ts diast sharp 72 % · axial · 0.39mm/px · z∈[+1300,+1343]mm · 2 of 318 slices shown]
[im 106/318  lung]
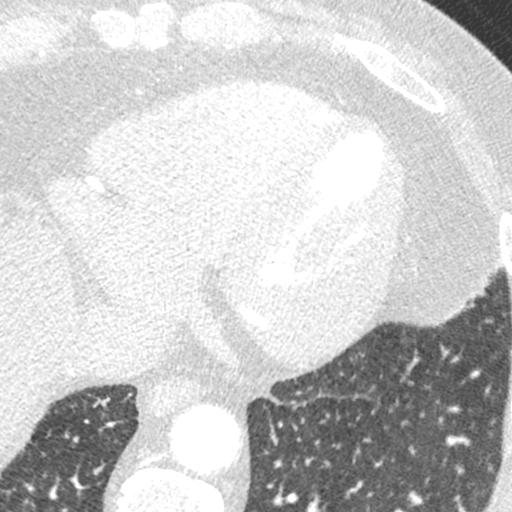
[im 212/318  lung]
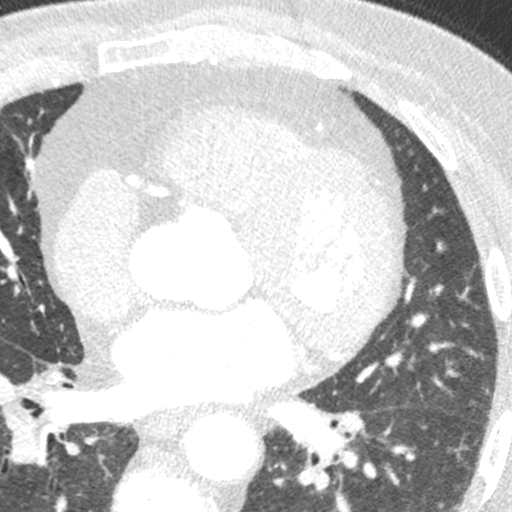

[Series 9: ts syst sharp 37 % · axial · 0.39mm/px · z∈[+1300,+1343]mm · 2 of 318 slices shown]
[im 106/318  lung]
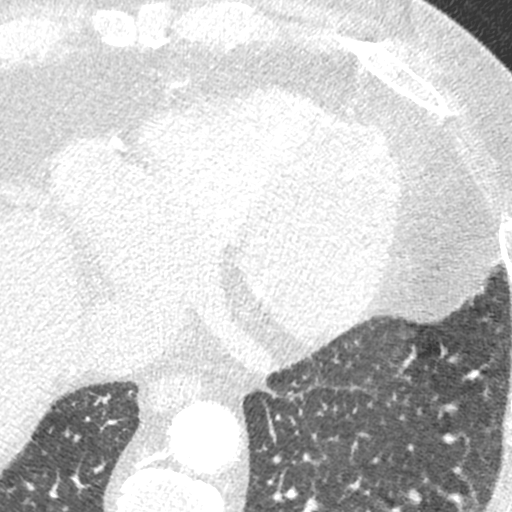
[im 212/318  lung]
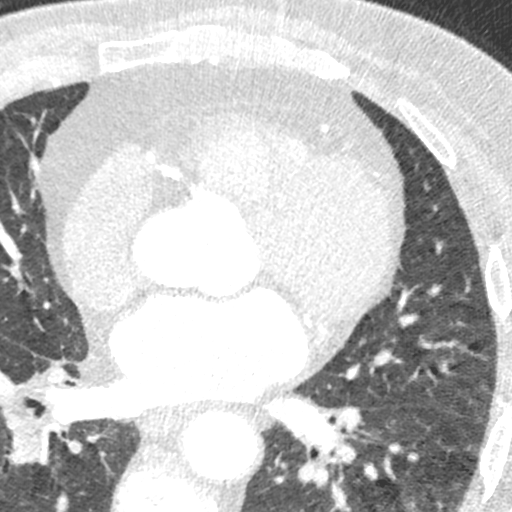

[8 of 20 positions shown; findings below may reference images not displayed]

FINDINGS: Within the visualized portions of the thorax there are no suspicious
appearing pulmonary nodules or masses, there is no acute
consolidative airspace disease, no pleural effusions, no
pneumothorax and no lymphadenopathy. Visualized portions of the
upper abdomen are unremarkable. There are no aggressive appearing
lytic or blastic lesions noted in the visualized portions of the
skeleton.
IMPRESSION: No significant incidental noncardiac findings are noted.
FINDINGS: A 120 kV prospective scan was triggered in the descending thoracic
aorta at 111 HU's. Axial non-contrast 3 mm slices were carried out
through the heart. The data set was analyzed on a dedicated work
station and scored using the Agatson method. Gantry rotation speed
was 250 msecs and collimation was .6 mm. No beta blockade and 0.8 mg
of sl NTG was given. The 3D data set was reconstructed in 5%
intervals of the 67-82 % of the R-R cycle. Diastolic phases were
analyzed on a dedicated work station using MPR, MIP and VRT modes.
The patient received 80 cc of contrast.

Aorta: Normal size.  No calcifications.  No dissection.

Aortic Valve:  Trileaflet.  Mild diffuse calcifications.

Coronary Arteries:  Normal coronary origin.  Right dominance.

RCA is a large dominant artery that gives rise to PDA and PLVB.
There is a mild (25-49%) calcified lesion in the proximal RCA. The
Mid portion with no plaques. The distal RCA with mild (25-49%) soft
plaque.

Left main is a large artery that gives rise to LAD and LCX arteries.

LAD is a large vessel. There is a long 7 mm mixed lesion which
starts as a moderate (50-69%) plaques and tapers off which appears
to be probably a greater than 70% stenosis. A second moderate lesion
in the mid to distal LAD which is calcified.

LCX is a non-dominant artery that gives rise to one large OM1
branch. There is a proximal napkin ring appearing soft plaque lesion
which also appears to be greater than 70%. Mid Lcx with a moderate
(50-69%) calcified lesion.

Other findings:

Normal pulmonary vein drainage into the left atrium.

Normal left atrial appendage without a thrombus.

Normal size of the pulmonary artery.
IMPRESSION: 1. Multivessel Coronary Artery Disease, CADRADS 4A. This study will
also be send for FFRct. Consider Further testing with Cardiac
catheterization.

2. Coronary calcium score of 205. This was 94 percentile for age and
sex matched control.

2. Normal coronary origin with right dominance.

Aayoub Calimero, DO

*** End of Addendum ***
EXAM:
OVER-READ INTERPRETATION  CT CHEST

The following report is an over-read performed by radiologist Dr.
Dorris Pfaff [REDACTED] on 11/13/2020. This
over-read does not include interpretation of cardiac or coronary
anatomy or pathology. The coronary calcium score/coronary CTA
interpretation by the cardiologist is attached.
FINDINGS: Within the visualized portions of the thorax there are no suspicious
appearing pulmonary nodules or masses, there is no acute
consolidative airspace disease, no pleural effusions, no
pneumothorax and no lymphadenopathy. Visualized portions of the
upper abdomen are unremarkable. There are no aggressive appearing
lytic or blastic lesions noted in the visualized portions of the
skeleton.
IMPRESSION: No significant incidental noncardiac findings are noted.

## 2020-11-13 MED ORDER — NITROGLYCERIN 0.4 MG SL SUBL
0.8000 mg | SUBLINGUAL_TABLET | Freq: Once | SUBLINGUAL | Status: AC
  Administered 2020-11-13: 0.8 mg via SUBLINGUAL

## 2020-11-13 MED ORDER — NITROGLYCERIN 0.4 MG SL SUBL
SUBLINGUAL_TABLET | SUBLINGUAL | Status: AC
  Filled 2020-11-13: qty 2

## 2020-11-13 MED ORDER — IOHEXOL 350 MG/ML SOLN
80.0000 mL | Freq: Once | INTRAVENOUS | Status: AC | PRN
  Administered 2020-11-13: 80 mL via INTRAVENOUS

## 2020-11-13 NOTE — Research (Signed)
IDENTIFY Informed Consent                  Subject Name:   Ronald Petersen. Ronald Petersen   Subject met inclusion and exclusion criteria.  The informed consent form, study requirements and expectations were reviewed with the subject and questions and concerns were addressed prior to the signing of the consent form.  The subject verbalized understanding of the trial requirements.  The subject agreed to participate in the IDENTIFY trial and signed the informed consent.  The informed consent was obtained prior to performance of any protocol-specific procedures for the subject.  A copy of the signed informed consent was given to the subject and a copy was placed in the subject's medical record.   [place] Bryer Cozzolino, Research Assistant  11/13/2020  13:09 p.m.

## 2020-11-14 ENCOUNTER — Encounter: Payer: Self-pay | Admitting: Cardiology

## 2020-11-14 ENCOUNTER — Other Ambulatory Visit: Payer: Self-pay | Admitting: Cardiology

## 2020-11-14 ENCOUNTER — Ambulatory Visit (INDEPENDENT_AMBULATORY_CARE_PROVIDER_SITE_OTHER): Payer: Medicare HMO | Admitting: Cardiology

## 2020-11-14 ENCOUNTER — Telehealth: Payer: Self-pay

## 2020-11-14 VITALS — BP 148/88 | HR 65 | Ht 72.0 in | Wt 224.6 lb

## 2020-11-14 DIAGNOSIS — R931 Abnormal findings on diagnostic imaging of heart and coronary circulation: Secondary | ICD-10-CM | POA: Diagnosis not present

## 2020-11-14 DIAGNOSIS — E1165 Type 2 diabetes mellitus with hyperglycemia: Secondary | ICD-10-CM | POA: Diagnosis not present

## 2020-11-14 DIAGNOSIS — G4733 Obstructive sleep apnea (adult) (pediatric): Secondary | ICD-10-CM

## 2020-11-14 DIAGNOSIS — I2510 Atherosclerotic heart disease of native coronary artery without angina pectoris: Secondary | ICD-10-CM

## 2020-11-14 DIAGNOSIS — Z01812 Encounter for preprocedural laboratory examination: Secondary | ICD-10-CM

## 2020-11-14 DIAGNOSIS — E1151 Type 2 diabetes mellitus with diabetic peripheral angiopathy without gangrene: Secondary | ICD-10-CM

## 2020-11-14 DIAGNOSIS — I10 Essential (primary) hypertension: Secondary | ICD-10-CM | POA: Diagnosis not present

## 2020-11-14 DIAGNOSIS — IMO0002 Reserved for concepts with insufficient information to code with codable children: Secondary | ICD-10-CM

## 2020-11-14 MED ORDER — ASPIRIN EC 81 MG PO TBEC: 81.0000 mg | DELAYED_RELEASE_TABLET | Freq: Every day | ORAL | 3 refills | Status: DC

## 2020-11-14 MED ORDER — ISOSORBIDE MONONITRATE ER 30 MG PO TB24: 30.0000 mg | ORAL_TABLET | Freq: Every day | ORAL | 6 refills | Status: DC

## 2020-11-14 MED FILL — ISOSORBIDE MN ER 30 MG TAB: 30 | 30 days supply | Qty: 30 | Fill #0

## 2020-11-14 NOTE — H&P (View-Only) (Signed)
Cardiology Office Note:    Date:  11/14/2020   ID:  Ronald Petersen, DOB May 27, 1956, MRN [id number]  PCP:  [name], Alferd Apa, DO  Cardiologist:  Berniece Salines, DO  Electrophysiologist:  None   Referring MD: [name], Alferd Apa, *   " Still having intermittent chest pain"  History of Present Illness:    Ronald Petersen is a 64 y.o. male with a hx of diabetes mellitus, hypertension, hyperlipidemia, obesity family history of premature coronary artery disease OSA he is on CPAP.  The patient presented initially on October 02, 2020 to be evaluated for intermittent chest pain and shortness of breath.  At that time given his risk factors I recommend the patient undergo a coronary CTA.  He was able to get this testing done. He is here today to discuss the results.   He is here today with his wife.  Past Medical History:  Diagnosis Date  . Anxiety   . Arthritis   . Cancer ([place])   . Complication of anesthesia    aspirated with back surgery at age 56  . Depression   . Diabetes mellitus   . GERD (gastroesophageal reflux disease)   . Hyperlipidemia   . Hypertension   . Neuromuscular disorder ([place])    NEUROPATHY  . Right rotator cuff tear 11/23/2018  . Sleep apnea    no CPAP    Past Surgical History:  Procedure Laterality Date  . APPENDECTOMY    . ARTHOSCOPIC ROTAOR CUFF REPAIR Right 11/23/2018   Procedure: ARTHROSCOPIC ROTATOR CUFF REPAIR;  Surgeon: [name], MD;  Location: [facility];  Service: Orthopedics;  Laterality: Right;  . CHOLECYSTECTOMY    . LUMBAR LAMINECTOMY    . SHOULDER ARTHROSCOPY WITH ROTATOR CUFF REPAIR AND SUBACROMIAL DECOMPRESSION Right 11/23/2018   Procedure: SHOULDER ARTHROSCOPY WITH ROTATOR CUFF REPAIR AND SUBACROMIAL DECOMPRESSION;  Surgeon: [name], MD;  Location: [facility];  Service: Orthopedics;  Laterality: Right;    Current Medications: Current Meds  Medication Sig  . fenofibrate 160 MG tablet Take 1  tablet (160 mg total) by mouth daily.  Marland Kitchen gabapentin (NEURONTIN) 800 MG tablet Take 1 tablet (800 mg total) by mouth 3 (three) times daily.  Marland Kitchen glimepiride ([name]) 4 MG tablet Take 1 tablet (4 mg total) by mouth daily before breakfast.  . glucose blood test strip Use as instructed  . lisinopril (ZESTRIL) 5 MG tablet TAKE 1 TABLET (5 MG TOTAL) BY MOUTH DAILY.  . metFORMIN ([name]) 500 MG 24 hr tablet Take 2 tablets (1,000 mg total) by mouth daily with breakfast AND 1 tablet (500 mg total) daily with supper.  . nitroGLYCERIN (NITROSTAT) 0.4 MG SL tablet Place 1 tablet (0.4 mg total) under the tongue every 5 (five) minutes as needed for chest pain.  Marland Kitchen omeprazole (PRILOSEC) 40 MG capsule 1 po bid  . ondansetron (ZOFRAN) 4 MG tablet Take 1 tablet (4 mg total) by mouth every 8 (eight) hours as needed.  Marland Kitchen oxyCODONE-acetaminophen (PERCOCET/ROXICET) 5-325 MG tablet TAKE 1 TABLET BY MOUTH EVERY 6 HOURS AS NEEDED  . rosuvastatin (CRESTOR) 20 MG tablet Take 1 tablet (20 mg total) by mouth daily.  . sennosides-docusate sodium (SENOKOT-S) 8.6-50 MG tablet Take 2 tablets by mouth daily.  . sertraline (ZOLOFT) 100 MG tablet Take 1 tablet (100 mg total) by mouth daily.  Marland Kitchen tiZANidine (ZANAFLEX) 4 MG tablet TAKE 1 TABLET (4 MG TOTAL) BY MOUTH EVERY 6 (SIX) HOURS AS NEEDED FOR MUSCLE SPASMS.  Marland Kitchen  traZODone (DESYREL) 50 MG tablet Take 0.5-1 tablets (25-50 mg total) by mouth at bedtime as needed for sleep.     Allergies:   Niacin   Social History   Socioeconomic History  . Marital status: Married    Spouse name: Lorriane Shire  . Number of children: 2  . Years of education: Not on file  . Highest education level: Not on file  Occupational History  . Occupation: self employed    Fish farm manager: UNEMPLOYED  Tobacco Use  . Smoking status: Former Smoker    Years: 16.00    Types: Cigarettes    Quit date: 12/29/1985    Years since quitting: 34.9  . Smokeless tobacco: Never Used  Vaping Use  . Vaping Use: Never used    Substance and Sexual Activity  . Alcohol use: Yes    Alcohol/week: 4.0 - 5.0 standard drinks    Types: 4 - 5 Cans of beer per week    Comment: 5 times per week  . Drug use: No  . Sexual activity: Yes    Partners: Female  Other Topics Concern  . Not on file  Social History Narrative   Exercise-- 3 days   Pt has hs degree   Social Determinants of Health   Financial Resource Strain:   . Difficulty of Paying Living Expenses: Not on file  Food Insecurity:   . Worried About Charity fundraiser in the Last Year: Not on file  . Ran Out of Food in the Last Year: Not on file  Transportation Needs:   . Lack of Transportation (Medical): Not on file  . Lack of Transportation (Non-Medical): Not on file  Physical Activity:   . Days of Exercise per Week: Not on file  . Minutes of Exercise per Session: Not on file  Stress:   . Feeling of Stress : Not on file  Social Connections:   . Frequency of Communication with Friends and Family: Not on file  . Frequency of Social Gatherings with Friends and Family: Not on file  . Attends Religious Services: Not on file  . Active Member of Clubs or Organizations: Not on file  . Attends Archivist Meetings: Not on file  . Marital Status: Not on file     Family History: The patient's family history includes Alcohol abuse in an other family member; Arthritis in an other family member; COPD in his mother; Colon cancer in an other family member; Coronary artery disease in an other family member; Depression in an other family member; Heart disease in his sister and sister; Hypertension in an other family member; Ovarian cancer in his sister and other family members; Stomach cancer in his maternal grandmother and sister; Stroke in his father.  ROS:   Review of Systems  Constitution: Negative for decreased appetite, fever and weight gain.  HENT: Negative for congestion, ear discharge, hoarse voice and sore throat.   Eyes: Negative for discharge,  redness, vision loss in right eye and visual halos.  Cardiovascular: Negative for chest pain, dyspnea on exertion, leg swelling, orthopnea and palpitations.  Respiratory: Negative for cough, hemoptysis, shortness of breath and snoring.   Endocrine: Negative for heat intolerance and polyphagia.  Hematologic/Lymphatic: Negative for bleeding problem. Does not bruise/bleed easily.  Skin: Negative for flushing, nail changes, rash and suspicious lesions.  Musculoskeletal: Negative for arthritis, joint pain, muscle cramps, myalgias, neck pain and stiffness.  Gastrointestinal: Negative for abdominal pain, bowel incontinence, diarrhea and excessive appetite.  Genitourinary: Negative for decreased libido,  genital sores and incomplete emptying.  Neurological: Negative for brief paralysis, focal weakness, headaches and loss of balance.  Psychiatric/Behavioral: Negative for altered mental status, depression and suicidal ideas.  Allergic/Immunologic: Negative for HIV exposure and persistent infections.    EKGs/Labs/Other Studies Reviewed:    The following studies were reviewed today:   EKG:  The ekg ordered today demonstrates sinus rhythm, heart rate 64 bpm with left axis deviation. Cardiac CTA Aorta: Normal size.  No calcifications.  No dissection.  Aortic Valve:  Trileaflet.  Mild diffuse calcifications.  Coronary Arteries:  Normal coronary origin.  Right dominance.  RCA is a large dominant artery that gives rise to PDA and PLVB. There is a mild (25-49%) calcified lesion in the proximal RCA. The Mid portion with no plaques. The distal RCA with mild (25-49%) soft plaque.  Left main is a large artery that gives rise to LAD and LCX arteries.  LAD is a large vessel. There is a long 7 mm mixed lesion which starts as a moderate (50-69%) plaques and tapers off which appears to be probably a greater than 70% stenosis. A second moderate lesion in the mid to distal LAD which is calcified.  LCX  is a non-dominant artery that gives rise to one large OM1 branch. There is a proximal napkin ring appearing soft plaque lesion which also appears to be greater than 70%. Mid Lcx with a moderate (50-69%) calcified lesion.  Other findings:  Normal pulmonary vein drainage into the left atrium.  Normal left atrial appendage without a thrombus.  Normal size of the pulmonary artery.  IMPRESSION: 1. Multivessel Coronary Artery Disease, CADRADS 4A. This study will also be send for FFRct. Consider Further testing with Cardiac catheterization.  2. Coronary calcium score of 205. This was 44 percentile for age and sex matched control.  2. Normal coronary origin with right dominance. CT FFR 1. Left Main:0.99  2. LAD: Proximal 0.99, Mid 0.62, Distal 0.57  3. VVO:HYWVPXTG 0.98, Mid 0.73, Distal 0.68  4. RCA: Proximal 0.99, Mid 0.62, Distal 0.57  IMPRESSION: FFRct with multivessel flow limiting stenosis as listed above. Recommend cardiac Catheterization.  Note: These examples are not recommendations of [employer] and only provided as examples of what other customers are doing.  TTE IMPRESSIONS  1. Left ventricular ejection fraction, by estimation, is 60 to 65%. The left ventricle has normal function. The left ventricle has no regional wall motion abnormalities. Left ventricular diastolic parameters are  consistent with Grade I diastolic dysfunction (impaired relaxation).  2. Right ventricular systolic function is normal. The right ventricular size is normal. There is normal pulmonary artery systolic pressure.  3. The mitral valve is degenerative. No evidence of mitral valve regurgitation. No evidence of mitral stenosis.  4. The aortic valve is tricuspid. Aortic valve regurgitation is not visualized. Mild aortic valve sclerosis is present, with no evidence of aortic valve stenosis.  5. There is mild dilatation of the ascending aorta, measuring 36 mm.  6. The inferior vena  cava is normal in size with greater than 50% respiratory variability, suggesting right atrial pressure of 3 mmHg.   Recent Labs: 02/07/2020: Hemoglobin 14.8; Platelets 193.0 09/06/2020: ALT 24 10/02/2020: TSH 1.290 11/06/2020: BUN 26; Creatinine, Ser 0.99; Potassium 4.8; Sodium 138  Recent Lipid Panel    Component Value Date/Time   CHOL 157 09/06/2020 1138   TRIG 174 (H) 09/06/2020 1138   TRIG 148 12/10/2006 1603   HDL 34 (L) 09/06/2020 1138   CHOLHDL 4.6 09/06/2020 1138   VLDL  53.6 (H) 05/31/2020 0910   LDLCALC 96 09/06/2020 1138   LDLDIRECT 93.0 05/31/2020 0910    Physical Exam:    VS:  BP (!) 148/88   Pulse 65   Ht 6' (1.829 m)   Wt 224 lb 9.6 oz (101.9 kg)   SpO2 97%   BMI 30.46 kg/m     Wt Readings from Last 3 Encounters:  11/14/20 224 lb 9.6 oz (101.9 kg)  10/02/20 221 lb 12.8 oz (100.6 kg)  09/11/20 223 lb 9.6 oz (101.4 kg)     GEN: Well nourished, well developed in no acute distress HEENT: Normal NECK: No JVD; No carotid bruits LYMPHATICS: No lymphadenopathy CARDIAC: S1S2 noted,RRR, no murmurs, rubs, gallops RESPIRATORY:  Clear to auscultation without rales, wheezing or rhonchi  ABDOMEN: Soft, non-tender, non-distended, +bowel sounds, no guarding. EXTREMITIES: No edema, No cyanosis, no clubbing MUSCULOSKELETAL:  No deformity  SKIN: Warm and dry NEUROLOGIC:  Alert and oriented x 3, non-focal PSYCHIATRIC:  Normal affect, good insight  ASSESSMENT:    1. Abnormal cardiac CT angiography   2. Coronary artery disease involving native coronary artery of native heart, unspecified whether angina present   3. Pre-procedure lab exam   4. DM (diabetes mellitus) type II uncontrolled, periph vascular disorder ([place])   5. Essential hypertension   6. OSA (obstructive sleep apnea)    PLAN:     His cardiac CTA as well as his FFR CT were abnormal suggesting flow-limiting lesions.  I spoke with the patient his wife today about these results.  The next best step is a left  heart catheterization.  The patient understands that risks include but are not limited to stroke (1 in 1000), death (1 in 19), kidney failure [usually temporary] (1 in 500), bleeding (1 in 200), allergic reaction [possibly serious] (1 in 200), and agrees to proceed.  I am going to start the patient on aspirin 81 mg daily as well as Imdur 30 mg daily.  He is already on Crestor.  His blood pressure acceptable no changes will be made to his antihypertensive medication. Diabetes is being managed by his primary care doctor. Hyperlipidemia continue patient on his current Crestor dose.  We will repeat his lipid profile in 6 weeks and if this is still with an LDL greater than 70 we will optimize his lipid-lowering medical regimen.  The patient is in agreement with the above plan. The patient left the office in stable condition.  The patient will follow up as scheduled.   Medication Adjustments/Labs and Tests Ordered: Current medicines are reviewed at length with the patient today.  Concerns regarding medicines are outlined above.  Orders Placed This Encounter  Procedures  . CBC   Meds ordered this encounter  Medications  . isosorbide mononitrate (IMDUR) 30 MG 24 hr tablet    Sig: Take 1 tablet (30 mg total) by mouth daily.    Dispense:  30 tablet    Refill:  6  . aspirin EC 81 MG tablet    Sig: Take 1 tablet (81 mg total) by mouth daily. Swallow whole.    Dispense:  90 tablet    Refill:  3    Patient Instructions  Medication Instructions:  Your physician has recommended you make the following change in your medication:  1. START Aspirin 81 mg once daily 2. START Imdur 30 mg once daily  *If you need a refill on your cardiac medications before your next appointment, please call your pharmacy*   Lab Work: Today: CBC  If you have labs (blood work) drawn today and your tests are completely normal, you will receive your results only by: Marland Kitchen MyChart Message (if you have MyChart) OR . A  paper copy in the mail If you have any lab test that is abnormal or we need to change your treatment, we will call you to review the results.   Testing/Procedures: Your physician has requested that you have a cardiac catheterization. Cardiac catheterization is used to diagnose and/or treat various heart conditions. Doctors may recommend this procedure for a number of different reasons. The most common reason is to evaluate chest pain. Chest pain can be a symptom of coronary artery disease (CAD), and cardiac catheterization can show whether plaque is narrowing or blocking your heart's arteries. This procedure is also used to evaluate the valves, as well as measure the blood flow and oxygen levels in different parts of your heart. For further information please visit HugeFiesta.[email or web address]. Please follow instructions below located under "other instructions".   Follow-Up: At [facility], you and your health needs are our priority.  As part of our continuing mission to provide you with exceptional heart care, we have created designated Provider Care Teams.  These Care Teams include your primary Cardiologist (physician) and Advanced Practice Providers (APPs -  Physician Assistants and Nurse Practitioners) who all work together to provide you with the care you need, when you need it.  Your next appointment:    Keep your scheduled appointment in [facility] on 01/17/21  The format for your next appointment:   In Person  Provider:   Berniece Salines, DO    Thank you for choosing [facility]!!     Other Instructions     [place] [address] Dept: [phone] Loc: [phone]  THOMA PAULSEN  11/14/2020  COVID TEST-- On 11/196/21 @ 10:00 am - This is a Drive Up Visit at [address] [place], [place] [address].  Someone will direct you to the appropriate testing line. Stay in  your car and someone will be with you shortly.   After you are tested please go home and self quarantine until the day of your procedure.   You are scheduled for a Cardiac Catheterization on Monday, November 22 with Dr. Harrell Gave End.  1. Please arrive at the [facility] (Main Entrance A) at [facility]: [place], [place] [address] at 5:30 AM (This time is two hours before your procedure to ensure your preparation). Free valet parking service is available.   Special note: Every effort is made to have your procedure done on time. Please understand that emergencies sometimes delay scheduled procedures.  2. Diet: Nothing to eat/drink after midnight the night before this procedure  3. Labs: You will need to have blood drawn on Wednesday, November 17 at [employer] [facility]: [address], Technical sales engineer . You do not need to be fasting.  4. Medication instructions in preparation for your procedure:  Do not take Diabetes Med Glucovance (Metformin + Glyburide) on the day of the procedure and HOLD 48 HOURS AFTER THE PROCEDURE.   Hold the following medications the morning of this procedure:   Glimepiride, nitroglycerin  On the morning of your procedure, take your Aspirin and any morning medicines NOT listed above.  You may use sips of water.  5. Plan for one night stay--bring personal belongings. 6. Bring a current list of your medications and  current insurance cards. 7. You MUST have a responsible person to drive you home. 8. Someone MUST be with you the first 24 hours after you arrive home or your discharge will be delayed. 9. Please wear clothes that are easy to get on and off and wear slip-on shoes.  Thank you for allowing [place] to care for you!   -- [facility]       Adopting a Healthy Lifestyle.  Know what a healthy weight is for you (roughly BMI <25) and aim to maintain this   Aim for 7+ servings of fruits and vegetables daily   65-80+  fluid ounces of water or unsweet tea for healthy kidneys   Limit to max 1 drink of alcohol per day; avoid smoking/tobacco   Limit animal fats in diet for cholesterol and heart health - choose grass fed whenever available   Avoid highly processed foods, and foods high in saturated/trans fats   Aim for low stress - take time to unwind and care for your mental health   Aim for 150 min of moderate intensity exercise weekly for heart health, and weights twice weekly for bone health   Aim for 7-9 hours of sleep daily   When it comes to diets, agreement about the perfect plan isnt easy to find, even among the experts. Experts at the [employer] developed an idea known as the Healthy Eating Plate. Just imagine a plate divided into logical, healthy portions.   The emphasis is on diet quality:   Load up on vegetables and fruits - one-half of your plate: Aim for color and variety, and remember that potatoes dont count.   Go for whole grains - one-quarter of your plate: Whole wheat, barley, wheat berries, quinoa, oats, brown rice, and foods made with them. If you want pasta, go with whole wheat pasta.   Protein power - one-quarter of your plate: Fish, chicken, beans, and nuts are all healthy, versatile protein sources. Limit red meat.   The diet, however, does go beyond the plate, offering a few other suggestions.   Use healthy plant oils, such as olive, canola, soy, corn, sunflower and peanut. Check the labels, and avoid partially hydrogenated oil, which have unhealthy trans fats.   If youre thirsty, drink water. Coffee and tea are good in moderation, but skip sugary drinks and limit milk and dairy products to one or two daily servings.   The type of carbohydrate in the diet is more important than the amount. Some sources of carbohydrates, such as vegetables, fruits, whole grains, and beans-are healthier than others.   Finally, stay active  Signed, Berniece Salines, DO    11/14/2020 2:56 PM    [place]

## 2020-11-14 NOTE — Patient Instructions (Addendum)
Medication Instructions:  Your physician has recommended you make the following change in your medication:  1. START Aspirin 81 mg once daily 2. START Imdur 30 mg once daily  *If you need a refill on your cardiac medications before your next appointment, please call your pharmacy*   Lab Work: Today: CBC If you have labs (blood work) drawn today and your tests are completely normal, you will receive your results only by: Marland Kitchen MyChart Message (if you have MyChart) OR . A paper copy in the mail If you have any lab test that is abnormal or we need to change your treatment, we will call you to review the results.   Testing/Procedures: Your physician has requested that you have a cardiac catheterization. Cardiac catheterization is used to diagnose and/or treat various heart conditions. Doctors may recommend this procedure for a number of different reasons. The most common reason is to evaluate chest pain. Chest pain can be a symptom of coronary artery disease (CAD), and cardiac catheterization can show whether plaque is narrowing or blocking your heart's arteries. This procedure is also used to evaluate the valves, as well as measure the blood flow and oxygen levels in different parts of your heart. For further information please visit HugeFiesta.[email or web address]. Please follow instructions below located under "other instructions".   Follow-Up: At [facility], you and your health needs are our priority.  As part of our continuing mission to provide you with exceptional heart care, we have created designated Provider Care Teams.  These Care Teams include your primary Cardiologist (physician) and Advanced Practice Providers (APPs -  Physician Assistants and Nurse Practitioners) who all work together to provide you with the care you need, when you need it.  Your next appointment:    Keep your scheduled appointment in [facility] on 01/17/21  The format for your next appointment:   In Person  Provider:     Berniece Salines, DO    Thank you for choosing [facility]!!     Other Instructions     [place] [address] Dept: [phone] Loc: [phone]  Ronald Petersen  11/14/2020  COVID TEST-- On 11/196/21 @ 10:00 am - This is a Drive Up Visit at [address] [place], [place] [address].  Someone will direct you to the appropriate testing line. Stay in your car and someone will be with you shortly.   After you are tested please go home and self quarantine until the day of your procedure.   You are scheduled for a Cardiac Catheterization on Monday, November 22 with Dr. Harrell Gave End.  1. Please arrive at the [facility] (Main Entrance A) at [facility]: [place], [place] [address] at 5:30 AM (This time is two hours before your procedure to ensure your preparation). Free valet parking service is available.   Special note: Every effort is made to have your procedure done on time. Please understand that emergencies sometimes delay scheduled procedures.  2. Diet: Nothing to eat/drink after midnight the night before this procedure  3. Labs: You will need to have blood drawn on Wednesday, November 17 at [employer]: [address] Technical sales engineer . You do not need to be fasting.  4. Medication instructions in preparation for your procedure:  Do not take Diabetes Med Glucovance (Metformin + Glyburide) on the day of the procedure and HOLD 48 HOURS AFTER THE PROCEDURE.   Hold  the following medications the morning of this procedure:   Glimepiride, nitroglycerin  On the morning of your procedure, take your Aspirin and any morning medicines NOT listed above.  You may use sips of water.  5. Plan for one night stay--bring personal belongings. 6. Bring a current list of your medications and current insurance cards. 7. You MUST have a responsible person to drive  you home. 8. Someone MUST be with you the first 24 hours after you arrive home or your discharge will be delayed. 9. Please wear clothes that are easy to get on and off and wear slip-on shoes.  Thank you for allowing [place] to care for you!   -- [place] [facility]

## 2020-11-14 NOTE — Telephone Encounter (Signed)
-----   Message from Berniece Salines, DO sent at 11/14/2020  9:03 AM EST ----- Please call patient and let him know that I need to speak with him and discuss his CT scan that was done yesterday.

## 2020-11-14 NOTE — Telephone Encounter (Signed)
Left a message for pt to call as soon as possible to schedule an appt with Dr. Harriet Masson to discuss his CT FFR and need for a heart cath. Message sent in [place] as well.
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
## 2020-11-15 ENCOUNTER — Telehealth: Payer: Self-pay | Admitting: *Deleted

## 2020-11-15 LAB — CBC
Hematocrit: 43.5 % (ref 37.5–51.0)
Hemoglobin: 15.2 g/dL (ref 13.0–17.7)
MCH: 28.6 pg (ref 26.6–33.0)
MCHC: 34.9 g/dL (ref 31.5–35.7)
MCV: 82 fL (ref 79–97)
Platelets: 192 10*3/uL (ref 150–450)
RBC: 5.31 x10E6/uL (ref 4.14–5.80)
RDW: 12.3 % (ref 11.6–15.4)
WBC: 6.9 10*3/uL (ref 3.4–10.8)

## 2020-11-15 NOTE — Telephone Encounter (Addendum)
Pt contacted pre-catheterization scheduled at [facility] for: Monday November 19, 2020 7:30 AM Verified arrival time and place: [facility]) at: 5:30 AM   No solid food after midnight prior to cath, clear liquids until 5 AM day of procedure.  Hold: [name]-AM of procedure Metformin-day of procedure and 48 hours post procedure  Except hold medications AM meds can be  taken pre-cath with sips of water including: ASA 81 mg   Confirmed patient has responsible adult to drive home post procedure and be with patient first 24 hours after arriving home: yes  You are allowed ONE visitor in the waiting room during the time you are at the hospital for your procedure. Both you and your visitor must wear a mask once you enter the hospital.       COVID-19 Pre-Screening Questions:  . In the past 14 days have you had a new cough, new headache, new nasal congestion, fever (100.4 or greater) unexplained body aches, new sore throat, or sudden loss of taste or sense of smell? no . In the past 14 days have you been around anyone with known Covid 19? no    Reviewed procedure/mask/visitor instructions, COVID-19 questions with patient.

## 2020-11-15 NOTE — Telephone Encounter (Signed)
Patient is returning call to review procedure instructions.

## 2020-11-15 NOTE — Telephone Encounter (Signed)
Spoke with patient and reviewed procedure instructions.

## 2020-11-16 ENCOUNTER — Other Ambulatory Visit (HOSPITAL_COMMUNITY)
Admission: RE | Admit: 2020-11-16 | Discharge: 2020-11-16 | Disposition: A | Discharge status: Home / Self Care | Payer: Medicare HMO | Source: Ambulatory Visit | Attending: Internal Medicine | Admitting: Internal Medicine

## 2020-11-16 DIAGNOSIS — E785 Hyperlipidemia, unspecified: Secondary | ICD-10-CM

## 2020-11-16 DIAGNOSIS — Z01812 Encounter for preprocedural laboratory examination: Secondary | ICD-10-CM | POA: Insufficient documentation

## 2020-11-16 DIAGNOSIS — Z20822 Contact with and (suspected) exposure to covid-19: Secondary | ICD-10-CM | POA: Insufficient documentation

## 2020-11-16 LAB — SARS CORONAVIRUS 2 (TAT 6-24 HRS): SARS Coronavirus 2: NEGATIVE

## 2020-11-19 ENCOUNTER — Other Ambulatory Visit: Payer: Self-pay

## 2020-11-19 ENCOUNTER — Encounter (HOSPITAL_COMMUNITY): Payer: Self-pay | Admitting: Internal Medicine

## 2020-11-19 ENCOUNTER — Ambulatory Visit (HOSPITAL_COMMUNITY)
Admission: RE | Admit: 2020-11-19 | Discharge: 2020-11-20 | Disposition: A | Discharge status: Home / Self Care | Payer: Medicare HMO | Attending: Internal Medicine | Admitting: Internal Medicine

## 2020-11-19 ENCOUNTER — Encounter (HOSPITAL_COMMUNITY)
Admission: RE | Disposition: A | Discharge status: Home / Self Care | Payer: Self-pay | Source: Home / Self Care | Attending: Internal Medicine

## 2020-11-19 DIAGNOSIS — G4733 Obstructive sleep apnea (adult) (pediatric): Secondary | ICD-10-CM | POA: Insufficient documentation

## 2020-11-19 DIAGNOSIS — R0602 Shortness of breath: Secondary | ICD-10-CM | POA: Diagnosis not present

## 2020-11-19 DIAGNOSIS — Z6830 Body mass index (BMI) 30.0-30.9, adult: Secondary | ICD-10-CM | POA: Insufficient documentation

## 2020-11-19 DIAGNOSIS — R079 Chest pain, unspecified: Secondary | ICD-10-CM | POA: Diagnosis present

## 2020-11-19 DIAGNOSIS — I10 Essential (primary) hypertension: Secondary | ICD-10-CM | POA: Diagnosis not present

## 2020-11-19 DIAGNOSIS — E785 Hyperlipidemia, unspecified: Secondary | ICD-10-CM | POA: Insufficient documentation

## 2020-11-19 DIAGNOSIS — R5383 Other fatigue: Secondary | ICD-10-CM | POA: Diagnosis present

## 2020-11-19 DIAGNOSIS — Z7984 Long term (current) use of oral hypoglycemic drugs: Secondary | ICD-10-CM | POA: Diagnosis not present

## 2020-11-19 DIAGNOSIS — I25119 Atherosclerotic heart disease of native coronary artery with unspecified angina pectoris: Secondary | ICD-10-CM | POA: Diagnosis present

## 2020-11-19 DIAGNOSIS — E1169 Type 2 diabetes mellitus with other specified complication: Secondary | ICD-10-CM

## 2020-11-19 DIAGNOSIS — Z79899 Other long term (current) drug therapy: Secondary | ICD-10-CM | POA: Diagnosis not present

## 2020-11-19 DIAGNOSIS — Z87891 Personal history of nicotine dependence: Secondary | ICD-10-CM | POA: Diagnosis not present

## 2020-11-19 DIAGNOSIS — E119 Type 2 diabetes mellitus without complications: Secondary | ICD-10-CM | POA: Insufficient documentation

## 2020-11-19 DIAGNOSIS — E669 Obesity, unspecified: Secondary | ICD-10-CM | POA: Diagnosis not present

## 2020-11-19 DIAGNOSIS — Z8249 Family history of ischemic heart disease and other diseases of the circulatory system: Secondary | ICD-10-CM | POA: Diagnosis not present

## 2020-11-19 DIAGNOSIS — I200 Unstable angina: Secondary | ICD-10-CM | POA: Diagnosis present

## 2020-11-19 DIAGNOSIS — I25110 Atherosclerotic heart disease of native coronary artery with unstable angina pectoris: Secondary | ICD-10-CM | POA: Diagnosis not present

## 2020-11-19 DIAGNOSIS — I2510 Atherosclerotic heart disease of native coronary artery without angina pectoris: Secondary | ICD-10-CM

## 2020-11-19 DIAGNOSIS — I2584 Coronary atherosclerosis due to calcified coronary lesion: Secondary | ICD-10-CM | POA: Insufficient documentation

## 2020-11-19 DIAGNOSIS — E1165 Type 2 diabetes mellitus with hyperglycemia: Secondary | ICD-10-CM

## 2020-11-19 DIAGNOSIS — Z955 Presence of coronary angioplasty implant and graft: Secondary | ICD-10-CM

## 2020-11-19 DIAGNOSIS — R931 Abnormal findings on diagnostic imaging of heart and coronary circulation: Secondary | ICD-10-CM

## 2020-11-19 HISTORY — PX: CORONARY STENT INTERVENTION: CATH118234

## 2020-11-19 HISTORY — PX: LEFT HEART CATH AND CORONARY ANGIOGRAPHY: CATH118249

## 2020-11-19 HISTORY — PX: INTRAVASCULAR ULTRASOUND/IVUS: CATH118244

## 2020-11-19 HISTORY — PX: CORONARY ULTRASOUND/IVUS: CATH118244

## 2020-11-19 LAB — GLUCOSE, CAPILLARY
Glucose-Capillary: 174 mg/dL — ABNORMAL HIGH (ref 70–99)
Glucose-Capillary: 126 mg/dL — ABNORMAL HIGH (ref 70–99)
Glucose-Capillary: 133 mg/dL — ABNORMAL HIGH (ref 70–99)
Glucose-Capillary: 142 mg/dL — ABNORMAL HIGH (ref 70–99)

## 2020-11-19 LAB — HEMOGLOBIN A1C
Hgb A1c MFr Bld: 7.2 % — ABNORMAL HIGH (ref 4.8–5.6)
Mean Plasma Glucose: 159.94 mg/dL

## 2020-11-19 LAB — POCT ACTIVATED CLOTTING TIME
Activated Clotting Time: 246 seconds
Activated Clotting Time: 257 seconds
Activated Clotting Time: 241 seconds
Activated Clotting Time: 241 seconds
Activated Clotting Time: 252 seconds

## 2020-11-19 SURGERY — LEFT HEART CATH AND CORONARY ANGIOGRAPHY
Anesthesia: LOCAL

## 2020-11-19 MED ORDER — SODIUM CHLORIDE 0.9 % WEIGHT BASED INFUSION
3.0000 mL/kg/h | INTRAVENOUS | Status: DC
  Administered 2020-11-19: 3 mL/kg/h via INTRAVENOUS

## 2020-11-19 MED ORDER — NITROGLYCERIN 1 MG/10 ML FOR IR/CATH LAB
INTRA_ARTERIAL | Status: DC | PRN
  Administered 2020-11-19 (×6): 200 ug via INTRACORONARY

## 2020-11-19 MED ORDER — LIDOCAINE HCL (PF) 1 % IJ SOLN
INTRAMUSCULAR | Status: DC | PRN
  Administered 2020-11-19: 2 mL

## 2020-11-19 MED ORDER — SODIUM CHLORIDE 0.9% FLUSH
3.0000 mL | Freq: Two times a day (BID) | INTRAVENOUS | Status: DC
  Administered 2020-11-19: 3 mL via INTRAVENOUS

## 2020-11-19 MED ORDER — ASPIRIN 81 MG PO CHEW: 81.0000 mg | CHEWABLE_TABLET | ORAL | Status: DC

## 2020-11-19 MED ORDER — SODIUM CHLORIDE 0.9% FLUSH: 3.0000 mL | INTRAVENOUS | Status: DC | PRN

## 2020-11-19 MED ORDER — HEPARIN (PORCINE) IN NACL 1000-0.9 UT/500ML-% IV SOLN
INTRAVENOUS | Status: DC | PRN
  Administered 2020-11-19 (×3): 500 mL

## 2020-11-19 MED ORDER — ASPIRIN EC 81 MG PO TBEC
81.0000 mg | DELAYED_RELEASE_TABLET | Freq: Every day | ORAL | Status: DC
  Administered 2020-11-20: 81 mg via ORAL
  Filled 2020-11-19: qty 1

## 2020-11-19 MED ORDER — NITROGLYCERIN 1 MG/10 ML FOR IR/CATH LAB
INTRA_ARTERIAL | Status: AC
  Filled 2020-11-19: qty 10

## 2020-11-19 MED ORDER — SODIUM CHLORIDE 0.9 % WEIGHT BASED INFUSION
1.0000 mL/kg/h | INTRAVENOUS | Status: DC
  Administered 2020-11-19: 1 mL/kg/h via INTRAVENOUS

## 2020-11-19 MED ORDER — LABETALOL HCL 5 MG/ML IV SOLN: 10.0000 mg | INTRAVENOUS | Status: AC | PRN

## 2020-11-19 MED ORDER — FENTANYL CITRATE (PF) 100 MCG/2ML IJ SOLN
INTRAMUSCULAR | Status: AC
  Filled 2020-11-19: qty 2

## 2020-11-19 MED ORDER — HYDRALAZINE HCL 20 MG/ML IJ SOLN: 10.0000 mg | INTRAMUSCULAR | Status: AC | PRN

## 2020-11-19 MED ORDER — HEPARIN SODIUM (PORCINE) 1000 UNIT/ML IJ SOLN
INTRAMUSCULAR | Status: AC
  Filled 2020-11-19: qty 1

## 2020-11-19 MED ORDER — PANTOPRAZOLE SODIUM 40 MG PO TBEC
40.0000 mg | DELAYED_RELEASE_TABLET | Freq: Every day | ORAL | Status: DC
  Administered 2020-11-19: 40 mg via ORAL
  Filled 2020-11-19: qty 1

## 2020-11-19 MED ORDER — MIDAZOLAM HCL 2 MG/2ML IJ SOLN
INTRAMUSCULAR | Status: DC | PRN
  Administered 2020-11-19 (×2): 1 mg via INTRAVENOUS

## 2020-11-19 MED ORDER — SENNOSIDES-DOCUSATE SODIUM 8.6-50 MG PO TABS
2.0000 | ORAL_TABLET | Freq: Every day | ORAL | Status: DC
  Filled 2020-11-19: qty 2

## 2020-11-19 MED ORDER — NITROGLYCERIN 0.4 MG SL SUBL
SUBLINGUAL_TABLET | SUBLINGUAL | Status: AC
  Filled 2020-11-19: qty 1

## 2020-11-19 MED ORDER — MIDAZOLAM HCL 2 MG/2ML IJ SOLN
INTRAMUSCULAR | Status: AC
  Filled 2020-11-19: qty 2

## 2020-11-19 MED ORDER — SODIUM CHLORIDE 0.9% FLUSH: 3.0000 mL | Freq: Two times a day (BID) | INTRAVENOUS | Status: DC

## 2020-11-19 MED ORDER — HEPARIN (PORCINE) IN NACL 1000-0.9 UT/500ML-% IV SOLN
INTRAVENOUS | Status: AC
  Filled 2020-11-19: qty 1000

## 2020-11-19 MED ORDER — CLOPIDOGREL BISULFATE 300 MG PO TABS
ORAL_TABLET | ORAL | Status: AC
  Filled 2020-11-19: qty 1

## 2020-11-19 MED ORDER — FENTANYL CITRATE (PF) 100 MCG/2ML IJ SOLN
INTRAMUSCULAR | Status: DC | PRN
  Administered 2020-11-19 (×3): 25 ug via INTRAVENOUS

## 2020-11-19 MED ORDER — SERTRALINE HCL 100 MG PO TABS
100.0000 mg | ORAL_TABLET | Freq: Every day | ORAL | Status: DC
  Administered 2020-11-19: 100 mg via ORAL
  Filled 2020-11-19: qty 1

## 2020-11-19 MED ORDER — VERAPAMIL HCL 2.5 MG/ML IV SOLN
INTRAVENOUS | Status: AC
  Filled 2020-11-19: qty 2

## 2020-11-19 MED ORDER — IOHEXOL 350 MG/ML SOLN
INTRAVENOUS | Status: DC | PRN
  Administered 2020-11-19: 220 mL

## 2020-11-19 MED ORDER — SODIUM CHLORIDE 0.9 % IV SOLN: INTRAVENOUS | Status: AC

## 2020-11-19 MED ORDER — TIZANIDINE HCL 4 MG PO TABS
4.0000 mg | ORAL_TABLET | Freq: Four times a day (QID) | ORAL | Status: DC | PRN
  Filled 2020-11-19: qty 1

## 2020-11-19 MED ORDER — NITROGLYCERIN 0.4 MG SL SUBL
SUBLINGUAL_TABLET | SUBLINGUAL | Status: DC | PRN
  Administered 2020-11-19 (×2): .4 mg via SUBLINGUAL

## 2020-11-19 MED ORDER — TRAZODONE HCL 50 MG PO TABS
25.0000 mg | ORAL_TABLET | Freq: Every evening | ORAL | Status: DC | PRN
  Administered 2020-11-19: 50 mg via ORAL
  Filled 2020-11-19: qty 1

## 2020-11-19 MED ORDER — LIDOCAINE HCL (PF) 1 % IJ SOLN
INTRAMUSCULAR | Status: AC
  Filled 2020-11-19: qty 30

## 2020-11-19 MED ORDER — NITROGLYCERIN 0.4 MG SL SUBL: 0.4000 mg | SUBLINGUAL_TABLET | SUBLINGUAL | Status: DC | PRN

## 2020-11-19 MED ORDER — OXYCODONE-ACETAMINOPHEN 5-325 MG PO TABS
1.0000 | ORAL_TABLET | Freq: Four times a day (QID) | ORAL | Status: DC | PRN
  Administered 2020-11-19 – 2020-11-20 (×3): 1 via ORAL
  Filled 2020-11-19 (×3): qty 1

## 2020-11-19 MED ORDER — ROSUVASTATIN CALCIUM 20 MG PO TABS
20.0000 mg | ORAL_TABLET | Freq: Every day | ORAL | Status: DC
  Administered 2020-11-19: 20 mg via ORAL
  Filled 2020-11-19: qty 1

## 2020-11-19 MED ORDER — VERAPAMIL HCL 2.5 MG/ML IV SOLN
INTRAVENOUS | Status: DC | PRN
  Administered 2020-11-19: 10 mL via INTRA_ARTERIAL

## 2020-11-19 MED ORDER — LISINOPRIL 5 MG PO TABS
5.0000 mg | ORAL_TABLET | Freq: Every day | ORAL | Status: DC
  Administered 2020-11-19 – 2020-11-20 (×2): 5 mg via ORAL
  Filled 2020-11-19 (×2): qty 1

## 2020-11-19 MED ORDER — FENOFIBRATE 160 MG PO TABS
160.0000 mg | ORAL_TABLET | Freq: Every day | ORAL | Status: DC
  Administered 2020-11-19 – 2020-11-20 (×2): 160 mg via ORAL
  Filled 2020-11-19 (×2): qty 1

## 2020-11-19 MED ORDER — TICAGRELOR 90 MG PO TABS
180.0000 mg | ORAL_TABLET | Freq: Once | ORAL | Status: AC
  Administered 2020-11-19: 180 mg via ORAL
  Filled 2020-11-19: qty 2

## 2020-11-19 MED ORDER — INSULIN ASPART 100 UNIT/ML ~~LOC~~ SOLN: 0.0000 [IU] | Freq: Every day | SUBCUTANEOUS | Status: DC

## 2020-11-19 MED ORDER — GABAPENTIN 400 MG PO CAPS
800.0000 mg | ORAL_CAPSULE | Freq: Three times a day (TID) | ORAL | Status: DC
  Administered 2020-11-19 – 2020-11-20 (×3): 800 mg via ORAL
  Filled 2020-11-19 (×3): qty 2

## 2020-11-19 MED ORDER — CLOPIDOGREL BISULFATE 300 MG PO TABS
ORAL_TABLET | ORAL | Status: DC | PRN
  Administered 2020-11-19: 600 mg via ORAL

## 2020-11-19 MED ORDER — ENOXAPARIN SODIUM 40 MG/0.4ML ~~LOC~~ SOLN: 40.0000 mg | SUBCUTANEOUS | Status: DC

## 2020-11-19 MED ORDER — TICAGRELOR 90 MG PO TABS
90.0000 mg | ORAL_TABLET | Freq: Two times a day (BID) | ORAL | Status: DC
  Administered 2020-11-20: 90 mg via ORAL
  Filled 2020-11-19: qty 1

## 2020-11-19 MED ORDER — INSULIN ASPART 100 UNIT/ML ~~LOC~~ SOLN: 0.0000 [IU] | Freq: Three times a day (TID) | SUBCUTANEOUS | Status: DC

## 2020-11-19 MED ORDER — CLOPIDOGREL BISULFATE 75 MG PO TABS: 75.0000 mg | ORAL_TABLET | Freq: Every day | ORAL | Status: DC

## 2020-11-19 MED ORDER — ACETAMINOPHEN 325 MG PO TABS
650.0000 mg | ORAL_TABLET | ORAL | Status: DC | PRN
  Administered 2020-11-19: 650 mg via ORAL
  Filled 2020-11-19: qty 2

## 2020-11-19 MED ORDER — HEPARIN SODIUM (PORCINE) 1000 UNIT/ML IJ SOLN
INTRAMUSCULAR | Status: DC | PRN
  Administered 2020-11-19: 2000 [IU] via INTRAVENOUS
  Administered 2020-11-19: 5000 [IU] via INTRAVENOUS
  Administered 2020-11-19: 2000 [IU] via INTRAVENOUS
  Administered 2020-11-19: 3000 [IU] via INTRAVENOUS
  Administered 2020-11-19: 6000 [IU] via INTRAVENOUS
  Administered 2020-11-19: 3000 [IU] via INTRAVENOUS

## 2020-11-19 MED ORDER — SODIUM CHLORIDE 0.9 % IV SOLN: 250.0000 mL | INTRAVENOUS | Status: DC | PRN

## 2020-11-19 MED ORDER — ISOSORBIDE MONONITRATE ER 30 MG PO TB24
30.0000 mg | ORAL_TABLET | Freq: Every day | ORAL | Status: DC
  Administered 2020-11-19 – 2020-11-20 (×2): 30 mg via ORAL
  Filled 2020-11-19 (×2): qty 1

## 2020-11-19 MED ORDER — ONDANSETRON HCL 4 MG/2ML IJ SOLN: 4.0000 mg | Freq: Four times a day (QID) | INTRAMUSCULAR | Status: DC | PRN

## 2020-11-19 SURGICAL SUPPLY — 30 items
BALLN SAPPHIRE 2.5X12 (BALLOONS) ×2
BALLN SAPPHIRE 3.0X15 (BALLOONS) ×2
BALLN SAPPHIRE ~~LOC~~ 2.0X12 (BALLOONS) ×1 IMPLANT
BALLN SAPPHIRE ~~LOC~~ 3.25X10 (BALLOONS) ×1 IMPLANT
BALLN SAPPHIRE ~~LOC~~ 3.5X18 (BALLOONS) ×1 IMPLANT
BALLN SAPPHIRE ~~LOC~~ 3.75X12 (BALLOONS) ×1 IMPLANT
BALLN WOLVERINE 3.00X10 (BALLOONS) ×2
BALLN ~~LOC~~ EMERGE MR 4.0X8 (BALLOONS) ×2
BALLOON SAPPHIRE 2.5X12 (BALLOONS) IMPLANT
BALLOON SAPPHIRE 3.0X15 (BALLOONS) IMPLANT
BALLOON WOLVERINE 3.00X10 (BALLOONS) IMPLANT
BALLOON ~~LOC~~ EMERGE MR 4.0X8 (BALLOONS) IMPLANT
CATH 5FR JL3.5 JR4 ANG PIG MP (CATHETERS) ×1 IMPLANT
CATH LAUNCHER 6FR EBU3.5 (CATHETERS) ×1 IMPLANT
CATH OPTICROSS HD (CATHETERS) ×1 IMPLANT
DEVICE RAD COMP TR BAND LRG (VASCULAR PRODUCTS) ×1 IMPLANT
GLIDESHEATH SLEND SS 6F .021 (SHEATH) ×1 IMPLANT
GUIDEWIRE INQWIRE 1.5J.035X260 (WIRE) IMPLANT
INQWIRE 1.5J .035X260CM (WIRE) ×2
KIT ENCORE 26 ADVANTAGE (KITS) ×2 IMPLANT
KIT HEART LEFT (KITS) ×2 IMPLANT
PACK CARDIAC CATHETERIZATION (CUSTOM PROCEDURE TRAY) ×2 IMPLANT
SLED PULL BACK IVUS (MISCELLANEOUS) ×1 IMPLANT
STENT RESOLUTE ONYX 3.0X34 (Permanent Stent) ×1 IMPLANT
STENT RESOLUTE ONYX 3.5X30 (Permanent Stent) ×1 IMPLANT
STENT RESOLUTE ONYX 3.5X8 (Permanent Stent) ×1 IMPLANT
TRANSDUCER W/STOPCOCK (MISCELLANEOUS) ×2 IMPLANT
TUBING CIL FLEX 10 FLL-RA (TUBING) ×2 IMPLANT
WIRE HI TORQ BMW 190CM (WIRE) ×1 IMPLANT
WIRE RUNTHROUGH .014X180CM (WIRE) ×2 IMPLANT

## 2020-11-19 NOTE — Research (Addendum)
RevealPlaque Informed Consent   Subject Name: Ronald Petersen    Subject: [id number]  Subject met inclusion and exclusion criteria.  The informed consent form, study requirements and expectations were reviewed with the subject and questions and concerns were addressed prior to the signing of the consent form.  The subject verbalized understanding of the trial requirements.  The subject agreed to participate in the RevealPlaque trial and signed the informed consent at 0700 on 11/19/2020.  The informed consent was obtained prior to performance of any protocol-specific procedures for the subject.  A copy of the signed informed consent was given to the subject and a copy was placed in the subject's medical record.   Ronald Petersen   Inclusion: _0  1. Age >18 _1  2. Clinically stable patient with known CAD. _2   3. CCTA showing stenosis in at least one major epicardial vessel of stentable/graftable diameter, in whom clinically indicated IVUS is planned within 45 days of the CCTA  and FFR CT available. _3  4. FFR CT successfully processed. _4  5. Willing to comply with protocol. _5  6. Agrees to be included in the study and able to provide written informed consent.  Exclusion: _6  1. CCTA showing no stenosis. _7  2. Uninterpretable CCTA by HeartFlow assessment, in which image quality prevents FFR CT from being processed. _8  3. Acute chest pain. _9  4. CABG prior to CCTA acquisition. _10  5. Prior history of PCI for 3 or more vessels. _11  6. MI less that 30 days prior to CCTA or between CCTA and ICA. _12  7. Suspicion of acute coronary syndrome, Acute MI or Unstable Angina. _13  8. Known complex congenital heart disease. _14  9. Tachycardia or significant arrythmia. _15  10. Subject requires an emergent procedure. _16  11. Evidence of ongoing or active clinical instability, including acute chest pain  (sudden onset), cardiogenic shock, unstable blood pressure with systolic blood pressure <03 mmHg, and severe  congestive heart failure (NYHA lll or lV) or acute  pulmonary edema.  _17  12. Any active, serious, life-threatening disease with a life expectancy of less than 2 months. _18  13. Currently enrolled in another study utilizing FFR CT or in an investigational trial that involves a non-approved cardiac drug or device.  _19  14. Persons under the protection of justice, guardianship, or curatorship.   Reason for CCTA scan: _20  Chest Pain _21  Asymptomatic/screening     _22  Prior MI     _23  Dyspnea   _24  Palpitations _25  Abnormal ECG    _26    Abnormal Stress or Perfusion   _27  Other Which CT Scanner was used?          _28  Philips Model: _29  CT 5000 Ingenuity    _30  Spectral CT 7500   _31  CT6000 iCT    _32  IQon         _33   Incisive CT    _34   Other _35  Siemens Model: _36  Somatom Definition Edge    _37  Somatom Definition Flash            _38  Somatom Force  _39  Somatom Drive   <JKKXFGHWEXHBZJIR>_6<\/VELFYBOFBPZWCHEN>_27   Other _41  Product/process development scientist:  _42  Revolution CT  _43  CardioGraph   _44  Optima CT660  _45   Discovery HD 750   _46  Other Radiation Exposure for CCTA Only (Sum for All Coronary Series)  CT dose index (CTDI) (mGy)  ________74.72_____________  Dose-length product (DLP) (mGy-cm) ____963.9________   _47   CCTA Image Uploaded   _48  CCTA report uploaded  _49  All subject information redacted from image and report? Subject  Demographics: Age: __64_____    Year of birth:  __1957____ Birth Sex: _0  Male   _1   Male  Weight: __99.8__ _2 lb  _3  kg   _4  Not done Height: _72__ _5  in  _6  cm   _7  Not done  Ethnicity: _8  Not of Hispanic, Latino/a, or Spanish origin _9  Hispanic, Latino/a, or Spanish origin Specify: _10   [place] American, Chicano/a      _11  [place]  _12  [place] _13 other Hispanic, Latino/a, or Spanish origin Race:  _14  White  _15  Black  _16  American [place] or [place] Native  _17  Asian:  Specify: _18  Asian [place]   _19  [place]  _20  [place]  _21  [place]   _22  Filipino  _23  Other Asian  _24  [place]   _25  Native Hawaiian or other Pacific  Islander              _26  Native Hawaiian   _27  Guamanian or Chamorro  _28  Samoan   _29  Other Pacific Islander   _30  Other (specify) Medical History: Angina within the last 45 days:   _31  Yes   _32  No If yes: _33  Typical _34  Atypical  _35  Dyspnea  _36  Noncardiac pain Angina type: _37  Stable _38  Unstable _39  Silent Ischemia  _40  Unknown _41  Other Congestive Heart Failure: _42  Yes _43  No Diabetes: _44  Yes  _45  No If yes:  _46  Type l   _47  Type ll Controlled by:  _48  Insulin   _49  Oral hypoglycemic  _50  Diet  _51   unknown Hypertension:  _52  Yes  <GYBWLSLHTDSKAJGO>_1<\/LXBWIOMBTDHRCBUL>_84   No  (Systolic >536, diastolic >46 or req. medication) Is subject taking anti-hypertensive medication?   _54  Yes  _55   No Hyperlipidemia:   _56  Yes  _57  No Is subject taking anti-hyperlipidemic medication?    _58  Yes  _59  No Tobacco Use:  _60  Former  _61  Current  _62   Non-smoker  _63   Unknown Any nicotine use in 24 hours prior to CCTA?   _64  Yes  _65   No Family history of premature atherosclerotic disease?   _66  Yes  _67   No (Coronary artery disease, cerebrovascular disease, or peripheral vascular disease for male 2o relatives < 52 and/or male 2o relatives < 97 years old) Stroke:  _68  Yes  _69  No Transient Ischemic Attack (TIA):   _70  Yes  _71  No Peripheral Vascular Disease:  _72  Yes  _73  No Sedentary Lifestyle:    _74  Yes   _75  No Other Relevant Disease or Comorbidity:    _76  Yes  _77  No Specify: ___________________   PCI History: Does the subject have previously stented vessels?   _78  Yes  _79  No ______________ Total number of PCI procedures ______________ Date of most recent PCI ______________ Total number of stented vessels NOTE: CASS Segment Diagram is provided for reference in Appendix A. PCI History Records: (complete a record for each stent) Stent 1:           Vessel: _80  Right coronary artery  _81  Left main artery  _82  Left circumflex artery _83  Left anterior descending artery   _84  Other________________ CASS Segment for starting location of stent:  ______________________________ CASS Segment for ending location of stent: _______________________________ Stent manufacturer: _________________________________________________ Diameter of stent (mm): _____________   Length of stent (mm): _____________ Stent 2:             Vessel: _85  Right coronary artery  _86  Left main artery  _87  Left circumflex artery _88  Left anterior descending artery   _89  Other________________ CASS Segment for starting location of stent: ______________________________  CASS Segment for ending location of stent: _______________________________ Stent manufacturer: _________________________________________________ Diameter of stent (mm): _____________   Length of stent (mm): _____________ Coronary Tests: Any coronary tests within 90 days prior to enrollment?  _0  Yes  _1  No Invasive Coronary Angiography (ICA):    _2  Yes _3  No Most recent test date: _________________   Result: _4  Positive  _5  Negative  _6  Intermediate  _7  Unknown _8  Image uploaded       Report: _9  Uploaded  _10  N/A _11  All subject information redacted from images and reports? Exercise Tolerance Test (ETT):  _12 Yes  _13  No     Most recent test date: Click or tap to enter a date. Result:   _14  Positive    _15  Negative  _16  Intermediate  _17   Unknown _18  Image uploaded    Report:  _19  Uploaded  _20  N/A  _21   All subject information redacted from images and reports? Stress Echocardiogram: _22  Yes  _23  No   Most recent date:Click or tap to enter a date. Result:  _24  Positive  _25  Negative  _26  Intermediate  _27  Unknown _28 Image uploaded    Report:  _29  Uploaded  _30  N/A      _31  All images redacted? Nuclear Myocardial Perfusion Scan:    _32  Yes  _33  No Most recent test date: Click or tap to enter a date.  Result:   _34  Positive    _35  Negative  _36  Intermediate  _37   Unknown _38 Image uploaded    Report:  _39  Uploaded  _40  N/A      _41  All images redacted? Cardiac MRI:   _42  Yes  _43  No Most recent test date: Click or tap to enter a  date.  Result:   _44  Positive    _45  Negative  _46  Intermediate  _47   Unknown _48 Image uploaded    Report:  _49  Uploaded  _50  N/A      _51  All images redacted? Cardiac PET:    _52  Yes  _53  No Most recent test date: Click or tap to enter a date.  Result:   _54  Positive    _55  Negative  _56  Intermediate  _57   Unknown _58 Image uploaded    Report:  _59  Uploaded  _60  N/A      _61  All images redacted?  Baseline Cath Procedure: Date of procedure: 19-Nov-2020 Blood pressure prior to procedure: 128/75  Heart Rate prior to procedure:75 Time of arterial access: 0745 Time of first lesion treatment: 0833 Time last guide catheter removed: _____1027_____ Aortic pressure (mean mmHg): ______79______ LVED pressure (mmHg): ________19____________ Arterial access site:   _62  Radial  _63  Femoral  _64  Brachial Maximum arterial sheath size (Fr) ___6________ Venous access needle size:  _65  18 G  _66  20 G  _67  4 FR  _68  5 FR  _69  6 FR  _70  Other Left ventriculography performed?  _71  Yes  _72  No If yes, when:   _73  Pre-intervention  _74  Post- intervention LVEF (%):__60________ Were there any procedural complications?          _75  Yes  _76  No _77  Dissection from wire  _78  Dissection from catheter  _79  Slow / no flow _80  Allergic reaction to medication  _81  Side branch occlusion  _82  Other  Was the FFRCT model available & used in cath lab during the procedure? _83  Y  _84  N _85  Angio image uploaded  _86  Cath report uploaded  _87  All subject information redacted from images and reports?  Intraprocedural Medications: Was adenosine administered within 24  hrs of the cath procedure?  _0  Yes  _1  No IV Adenosine start time: _______________ Adenosine dose:  _2  140 ug/kg/min  _3  Other dose  _4  None Was nitroglycerin administered within 24 hrs of the cath procedure? _5  Yes _6  No NTG dose: _____________ NTG Route: _7  IV  _8  IC  _9  Other  Intraprocedural Devices: (total number of each device used during the procedure): ____0___ Pressure wires & FFR  wires ___2____ Guidewires   (Do not include wires used before intervention and pressure/FFR wires) ___8____ Balloon catheters ___3____ Drug eluting stents ___0____ Bare metal stents ___1___ Guide catheters ___1__ Intravascular ultrasound catheters ___0___ Intravascular ultrasound OCT catheters ____0__ Thrombectomy and atherectomy catheters ___0___ Temporary pacemakers ___0__ Intra-aortic balloon pumps (IABP) Intraprocedural Radiation exposure: Total fluoroscopy time ____34.3_____ (minutes) Absorbed Dose of Radiation __3120_______   _10  mGy  _11  Gy Dose Area Product: _________________  _12  Gy*cm2  _13  cGy*cm2  _14  mGy*cm2  Other: ______________ Contrast Use: (check all that apply) _15  Ominipaque  _16  Visipaque  _17   Isovue  _18  Iomeron  _19  Ultravist   _20 Oxilan _21  Hypaque  _22  Isopaque  _23  Hexibrix  _24  OptiRay  _25  Iopamidol  Total amount of contrast used (mL): ____220 IVUS/OCT: Model: _26  Philips Volcano                    Catheter type:    _27  Revolution  _28  Eagle Eye Platinum  _29  Eagle Eye  P     Platinum ST          _30  Refinity ST               _31  [employer]                   Catheter type:  _32  Opticross   _33  Opticross HD  _34  Other                _35  Terumo                    Catheter type:   _36  ViewIT    _37  Intrafocus WR  Ivus Image uploaded:  _38  Yes  _39  No OCT taken?   _40  Yes   _41  No   _42  All subject information redacted from images and reports?  IVUS/OCT Records Was automatic pullback used for IVUS/OCT acquisition?   _43  Yes  _44  No       Pullback speed (mm/sec): _____________________        Length of pullback (mm): _____________________ Vessel 1: Image type:   _45  IVUS   _46  OCT  _47  Right coronary artery  _48  Left main artery  _49  Left circumflex artery _50  Left anterior descending artery   _51  Other________________ CASS Segment for starting location of guidewire: __________________________ CASS Segment for ending location of guidewire: ________________________ Guide  catheter size:  _52 4 FR  _53 5 FR  _54 6 FR  _55 7 FR  _56 8 FR Vessel 2: Image type:   _57  IVUS   _58  OCT  _59  Right coronary artery  _60  Left main artery  _61  Left circumflex artery _62  Left anterior descending artery   _63  Other____Obtuse Marginal________ CASS Segment for starting location of guidewire: __________________________ CASS Segment for ending location of guidewire: ________________________ Guide catheter size:  _64 4 FR  _65 5 FR  _66 6 FR  _67 7 FR  _68 8 FR Vessel 3: Image type:   _69  IVUS   _70  OCT  _71  Right coronary artery  _72  Left main artery  _73  Left  circumflex artery _0  Left anterior descending artery   _1  Other________________ CASS Segment for starting location of guidewire: __________________________ CASS Segment for ending location of guidewire: ________________________ Guide catheter size:  _2 4 FR  _3 5 FR  _4 6 FR  _5 7 FR  _6 8 FR FFR/NHPR Records Measurement taken during cath procedure: _7  FFR  _8  DFR  _9  IFR  _10  RFR   _11 Other Vessel 1: _12  Right coronary artery  _13  Left main artery  _14  Left circumflex artery _15  Left anterior descending artery   _16  Other________________ Contrast used?     _17  Yes   _18  No CASS Segment for starting location of guide wire: __________________________ CASS Segment for ending location of guide wire: ___________________________ Guide catheter size:  _19 4 FR  _20 5 FR  _21 6 FR  _22 7 FR  _23 8 FR FFR Value: _____________ Pd value: ______________ Pa value: ______________ NHPR value: ____________ Vessel 2: _24  Right coronary artery  _25  Left main artery  _26  Left circumflex artery _27  Left anterior descending artery   _28  Other________________ Contrast used?     _29  Yes   _30  No CASS Segment for starting location of guide wire: __________________________ CASS Segment for ending location of guide wire: ___________________________ Guide catheter size:  _31 4 FR  _32 5 FR  _33 6 FR  _34 7 FR  _35 8 FR FFR Value: _____________ Pd value: ______________ Pa value: ______________ NHPR  value: ____________  Vessel 3: _36  Right coronary artery  _37  Left main artery  _38  Left circumflex artery _39  Left anterior descending artery   _40  Other________________ Contrast used?     _41  Yes   _42  No CASS Segment for starting location of guide wire: __________________________ CASS Segment for ending location of guide wire: ___________________________ Guide catheter size:  _43 4 FR  _44 5 FR  _45 6 FR  _46 7 FR  _47 8 FR FFR Value: _____________ Pd value: ______________ Pa value: ______________ NHPR value: ____________ FFR Uploads _48  Angio Image  _49  FFR Report  _50  FFR Tracings  _51  Screenshot of Angio - starting position of FFR guidewire without contrast _52  Screenshot of Angio - starting position of FFR guidewire with contrast  NHPR Uploads _53  Angio Image  _54  NHPR Report  _55  NHPR Tracings  _56  Screenshot of Angio - starting position of NHPR guidewire without contrast _57  Screenshot of Angio - starting position of NHPR guidewire with contrast _58  All subject information redacted from images and reports?  Study completion:  Did subject complete participation in the study?    _59  Yes  _60  No If no, reason:                         _61  Death _62  Withdrew consent  _63  Other  _64  Screen Failure Screen Failure reason:  _65  Ivus not used  _66  Use of balloon prior  _67  Automatic pullback not used

## 2020-11-19 NOTE — Plan of Care (Signed)

## 2020-11-19 NOTE — Interval H&P Note (Signed)
History and Physical Interval Note:  11/19/2020 7:05 AM  Ronald Petersen  has presented today for surgery, with the diagnosis of unstable angina and abnormal cardiac CTA.  The various methods of treatment have been discussed with the patient and family. After consideration of risks, benefits and other options for treatment, the patient has consented to  Procedure(s): LEFT HEART CATH AND CORONARY ANGIOGRAPHY (N/A) as a surgical intervention.  The patient's history has been reviewed, patient examined, no change in status, stable for surgery.  I have reviewed the patient's chart and labs.  Questions were answered to the patient's satisfaction.    Cath Lab Visit (complete for each Cath Lab visit)  Clinical Evaluation Leading to the Procedure:   ACS: No.  Non-ACS:    Anginal Classification: CCS IV  Anti-ischemic medical therapy: Maximal Therapy (2 or more classes of medications)  Non-Invasive Test Results: High-risk stress test findings: cardiac mortality >3%/year (3-vessel disease by cardiac CTA/CTFFR)  Prior CABG: No previous CABG  Ronald Petersen

## 2020-11-19 NOTE — Brief Op Note (Signed)
BRIEF CARDIAC CATHETERIZATION NOTE  11/19/2020  10:40 AM  PATIENT:  Ronald Petersen  64 y.o. male  PRE-OPERATIVE DIAGNOSIS:  Unstable angina and abnormal cardiac CTA  POST-OPERATIVE DIAGNOSIS:  Same  PROCEDURE:  Procedure(s): LEFT HEART CATH AND CORONARY ANGIOGRAPHY (N/A) Intravascular Ultrasound/IVUS (N/A) CORONARY STENT INTERVENTION (N/A)  SURGEON:  Surgeon(s) and Role:    * Koty Anctil, Harrell Gave, MD - Primary  FINDINGS: 1. Three vessel CAD, including 80-90% proximal/mid LAD stenosis involving D1, 70% proximal LCx and 80-90% ostial OM1 stenoses, and diffuse mild to moderate RCA plaquing with 70% lesion in rPLA. 2. Normal LVEF with mildly elevated filling pressure. 3. Successful IVUS-guided PCI to proximal/mid LAD using Resolute Onyx 3.5 x 30 mm DES and PTCA to ostial D1. 4. Successful IVUS-guided PCI to ostial/proximal LCx extending into OM1 using overlapping Resolute Onyx 3.5 x 8 mm and 3.0 x 34 mm drug-eluting stents.  RECOMMENDATIONS: 1. Overnight extended recovery. 2. DAPT with aspirin and clopidogrel for at least 6 months, ideally longer. 3. Aggressive secondary prevention. 4. Medical therapy of rPLA stenosis.  Nelva Bush, MD [facility]

## 2020-11-20 ENCOUNTER — Other Ambulatory Visit (HOSPITAL_COMMUNITY): Payer: Self-pay | Admitting: Cardiology

## 2020-11-20 ENCOUNTER — Telehealth: Payer: Self-pay

## 2020-11-20 ENCOUNTER — Encounter (HOSPITAL_COMMUNITY): Payer: Self-pay | Admitting: Internal Medicine

## 2020-11-20 DIAGNOSIS — E785 Hyperlipidemia, unspecified: Secondary | ICD-10-CM

## 2020-11-20 DIAGNOSIS — E119 Type 2 diabetes mellitus without complications: Secondary | ICD-10-CM | POA: Diagnosis not present

## 2020-11-20 DIAGNOSIS — R931 Abnormal findings on diagnostic imaging of heart and coronary circulation: Secondary | ICD-10-CM | POA: Diagnosis not present

## 2020-11-20 DIAGNOSIS — I2584 Coronary atherosclerosis due to calcified coronary lesion: Secondary | ICD-10-CM | POA: Diagnosis not present

## 2020-11-20 DIAGNOSIS — I25119 Atherosclerotic heart disease of native coronary artery with unspecified angina pectoris: Secondary | ICD-10-CM | POA: Diagnosis not present

## 2020-11-20 DIAGNOSIS — I200 Unstable angina: Secondary | ICD-10-CM | POA: Diagnosis not present

## 2020-11-20 DIAGNOSIS — I10 Essential (primary) hypertension: Secondary | ICD-10-CM

## 2020-11-20 DIAGNOSIS — I2510 Atherosclerotic heart disease of native coronary artery without angina pectoris: Secondary | ICD-10-CM | POA: Diagnosis not present

## 2020-11-20 DIAGNOSIS — Z8249 Family history of ischemic heart disease and other diseases of the circulatory system: Secondary | ICD-10-CM | POA: Diagnosis not present

## 2020-11-20 DIAGNOSIS — E1169 Type 2 diabetes mellitus with other specified complication: Secondary | ICD-10-CM

## 2020-11-20 DIAGNOSIS — G4733 Obstructive sleep apnea (adult) (pediatric): Secondary | ICD-10-CM | POA: Diagnosis not present

## 2020-11-20 DIAGNOSIS — R0602 Shortness of breath: Secondary | ICD-10-CM | POA: Diagnosis not present

## 2020-11-20 DIAGNOSIS — R5382 Chronic fatigue, unspecified: Secondary | ICD-10-CM

## 2020-11-20 DIAGNOSIS — Z9861 Coronary angioplasty status: Secondary | ICD-10-CM

## 2020-11-20 DIAGNOSIS — Z6830 Body mass index (BMI) 30.0-30.9, adult: Secondary | ICD-10-CM | POA: Diagnosis not present

## 2020-11-20 DIAGNOSIS — E669 Obesity, unspecified: Secondary | ICD-10-CM | POA: Diagnosis not present

## 2020-11-20 DIAGNOSIS — I25110 Atherosclerotic heart disease of native coronary artery with unstable angina pectoris: Secondary | ICD-10-CM | POA: Diagnosis not present

## 2020-11-20 LAB — BASIC METABOLIC PANEL
Anion gap: 15 (ref 5–15)
BUN: 14 mg/dL (ref 8–23)
CO2: 21 mmol/L — ABNORMAL LOW (ref 22–32)
Calcium: 9.4 mg/dL (ref 8.9–10.3)
Chloride: 102 mmol/L (ref 98–111)
Creatinine, Ser: 0.77 mg/dL (ref 0.61–1.24)
GFR, Estimated: >60 mL/min (ref >60)
Glucose, Bld: 137 mg/dL — ABNORMAL HIGH (ref 70–99)
Potassium: 3.6 mmol/L (ref 3.5–5.1)
Sodium: 138 mmol/L (ref 135–145)

## 2020-11-20 LAB — CBC
HCT: 37.9 % — ABNORMAL LOW (ref 39.0–52.0)
Hemoglobin: 13 g/dL (ref 13.0–17.0)
MCH: 28.8 pg (ref 26.0–34.0)
MCHC: 34.3 g/dL (ref 30.0–36.0)
MCV: 84 fL (ref 80.0–100.0)
Platelets: 172 10*3/uL (ref 150–400)
RBC: 4.51 MIL/uL (ref 4.22–5.81)
RDW: 12.6 % (ref 11.5–15.5)
WBC: 6.6 10*3/uL (ref 4.0–10.5)
nRBC: 0 % (ref 0.0–0.2)

## 2020-11-20 LAB — GLUCOSE, CAPILLARY
Glucose-Capillary: 153 mg/dL — ABNORMAL HIGH (ref 70–99)
Glucose-Capillary: 166 mg/dL — ABNORMAL HIGH (ref 70–99)

## 2020-11-20 MED ORDER — TICAGRELOR 90 MG PO TABS
90.0000 mg | ORAL_TABLET | Freq: Two times a day (BID) | ORAL | 1 refills | Status: DC
Start: 2020-11-20 — End: 2020-11-20

## 2020-11-20 MED ORDER — METFORMIN HCL ER 500 MG PO TB24: ORAL_TABLET | ORAL | 3 refills | Status: DC

## 2020-11-20 MED FILL — BRILINTA 90 MG TABLET: 90 | 30 days supply | Qty: 60 | Fill #0

## 2020-11-20 NOTE — Progress Notes (Signed)
CARDIAC REHAB PHASE I   PRE:  Rate/Rhythm: 69 SR  BP:  Supine:   Sitting: 138/73  Standing:    SaO2: 97%RA  MODE:  Ambulation: 470 ft   POST:  Rate/Rhythm: 83 SR  BP:  Supine:   Sitting: 131/75  Standing:    SaO2: 93-94%RA 0743-0840 Pt walked 470 ft on RA with steady gait and tolerated well. No CP. Pt has neuropathy and can only walk short distances. Discussed importance of brilinta with stents. Reviewed NTG use, heart healthy and low carb food choices, walking as tolerated (and later maybe pursuing water exercises due to orthopedic issues). Pt stated he was taking 15 Advil a day. He has quit in anticipation of PCI and knows he cannot resume on brilinta. Discussed CRP 2 and will send referral letter to [facility].  Pt voiced understanding of ed.   Graylon Good, RN BSN  11/20/2020 8:37 AM

## 2020-11-20 NOTE — Telephone Encounter (Signed)
Spoke to the patient just now and got him scheduled to see Dr. Harriet Masson on Monday 11/29 in [facility]. He verbalizes understanding and thanks me for the call back.

## 2020-11-20 NOTE — Discharge Summary (Signed)
Discharge Summary    Patient ID: [name] MRN: [id number]; DOB: 21-Nov-1956  Admit date: 11/19/2020 Discharge date: 11/20/2020  Primary Care Provider: Carollee Petersen, Ronald Apa, DO  Primary Cardiologist: Ronald Salines, DO  Primary Electrophysiologist:  None   Discharge Diagnoses    Principal Problem:   Unstable angina [facility]) Active Problems:   Fatigue   Essential hypertension   Hyperlipidemia associated with type 2 diabetes mellitus ([place])   Abnormal cardiac CT angiography   Coronary artery disease involving native coronary artery of native heart with angina pectoris [facility])   CAD S/P DES PCI LAD & LCx-OM  Diagnostic Studies/Procedures    Cath: 11/19/20  Conclusions: 1. Three vessel CAD, including 80-90% proximal/mid LAD stenosis involving D1, 70% proximal LCx and 80-90% ostial OM1 stenoses, and diffuse mild to moderate RCA plaquing with 70% lesion in rPLA. 2. Normal left ventricular systolic function with mildly elevated filling pressure. 3. Successful IVUS-guided PCI to proximal/mid LAD using Resolute Onyx 3.5 x 30 mm DES and PTCA to ostial D1. 4. Successful IVUS-guided PCI to ostial/proximal LCx extending into OM1 using overlapping Resolute Onyx 3.5 x 8 mm and 3.0 x 34 mm drug-eluting stents.  Recommendations: 1. Overnight extended recovery. 2. Patient was initially loaded with clopidogrel during catheterization.  However, following completion of the procedure, he and his wife mentioned that two sisters had MI's shortly after undergoing PCI.  This raises the question of poor clopidogrel metabolism.  We will load with ticagrelor this evening and continue ticagerlor 90 mg twice daily pending results of Cytochrome P450 2C19 genotype.  If he has a normal genotype, [name] can be transitioned back to clopidogrel as an outpatient to complete at least 6 months of DAPT (ideally longer). 3. Aggressive secondary prevention of coronary artery disease. 4. Medical therapy of rPLA  stenosis.  Ronald Bush, MD [facility]  Diagnostic Dominance: Right  Intervention    _____________   History of Present Illness     Ronald Petersen is a 64 y.o. male with PMH of DM, HTN, HLD and obesity along with family hx of premature CAD and OSA on CPAP. Presented to the office on 10/02/20 with chest pain and set up for outpatient coronary CTA. This was abnormal with multivessel disease in the LAD, Lcx and RCA. He followed up in the office on 11/17 to discuss results and was set up for outpatient cardiac cath.   Hospital Course     Underwent cardiac cath noted above with 3v disease including 80-90% proximal/mid LAD stenosis involving D1, 70% proximal LCx and 80-90% ostial OM1 stenoses, and diffuse mild to moderate RCA plaquing with 70% lesion in rPLA. Successful IVUS guided PCI/DES to p/mLAD, PTCA to ostial D1, along with IVUS guided PCI to ostial/pLCx extending into OM! With overlapping DES x2. He was initially loaded with plavix during the procedure. His spouse then noted that his sisters had MI's shortly after PCI with question of poor response to plavix. He was then switched and loaded to Brilinta. Cytochrome P450 was sent prior to discharge. Plan for DAPT with ASA/Brilinta (possible switch to plavix pending results) for at least 6 months. Medical therapy of rPLA. No complications noted overnight. In regards to his statin, reports he has had issues with higher doses in the past with myalgias. Discussed referring to lipid clinic which he is agreeable to. Ambulated with cardiac rehab without recurrent chest pain.    Did the patient have an acute coronary syndrome (MI, NSTEMI, STEMI, etc) this admission?:  No                               Did the patient have a percutaneous coronary intervention (stent / angioplasty)?:  Yes.     Cath/PCI Registry Performance & Quality Measures: 5. Aspirin prescribed? - Yes 6. ADP Receptor Inhibitor (Plavix/Clopidogrel, Brilinta/Ticagrelor or  Effient/Prasugrel) prescribed (includes medically managed patients)? - Yes 7. High Intensity Statin (Lipitor 40-80mg  or Crestor 20-40mg ) prescribed? - Yes  8. For EF <40%, was ACEI/ARB prescribed? - Not Applicable (EF >/= 93%) 9. For EF <40%, Aldosterone Antagonist (Spironolactone or Eplerenone) prescribed? - Not Applicable (EF >/= 79%) 10. Cardiac Rehab Phase II ordered? - Yes       _____________  Discharge Vitals Blood pressure 133/68, pulse 61, temperature 97.6 F (36.4 C), temperature source Oral, resp. rate 14, height 6' (1.829 m), weight 98.4 kg, SpO2 95 %.  Filed Weights   11/19/20 0556 11/20/20 0450  Weight: 99.8 kg 98.4 kg    Labs & Radiologic Studies    CBC Recent Labs    11/20/20 0138  WBC 6.6  HGB 13.0  HCT 37.9*  MCV 84.0  PLT 024   Basic Metabolic Panel Recent Labs    11/20/20 0138  NA 138  K 3.6  CL 102  CO2 21*  GLUCOSE 137*  BUN 14  CREATININE 0.77  CALCIUM 9.4   Liver Function Tests No results for input(s): AST, ALT, ALKPHOS, BILITOT, PROT, ALBUMIN in the last 72 hours. No results for input(s): LIPASE, AMYLASE in the last 72 hours. High Sensitivity Troponin:   No results for input(s): TROPONINIHS in the last 720 hours.  BNP Invalid input(s): POCBNP D-Dimer No results for input(s): DDIMER in the last 72 hours. Hemoglobin A1C Recent Labs    11/19/20 1528  HGBA1C 7.2*   Fasting Lipid Panel No results for input(s): CHOL, HDL, LDLCALC, TRIG, CHOLHDL, LDLDIRECT in the last 72 hours. Thyroid Function Tests No results for input(s): TSH, T4TOTAL, T3FREE, THYROIDAB in the last 72 hours.  Invalid input(s): FREET3 _____________  CARDIAC CATHETERIZATION  Result Date: 11/19/2020 Conclusions: 1. Three vessel CAD, including 80-90% proximal/mid LAD stenosis involving D1, 70% proximal LCx and 80-90% ostial OM1 stenoses, and diffuse mild to moderate RCA plaquing with 70% lesion in rPLA. 2. Normal left ventricular systolic function with mildly  elevated filling pressure. 3. Successful IVUS-guided PCI to proximal/mid LAD using Resolute Onyx 3.5 x 30 mm DES and PTCA to ostial D1. 4. Successful IVUS-guided PCI to ostial/proximal LCx extending into OM1 using overlapping Resolute Onyx 3.5 x 8 mm and 3.0 x 34 mm drug-eluting stents.  Recommendations: 1. Overnight extended recovery. 2. Patient was initially loaded with clopidogrel during catheterization.  However, following completion of the procedure, he and his wife mentioned that two sisters had MI's shortly after undergoing PCI.  This raises the question of poor clopidogrel metabolism.  We will load with ticagrelor this evening and continue ticagerlor 90 mg twice daily pending results of Cytochrome P450 2C19 genotype.  If he has a normal genotype, Mr. Kimmons can be transitioned back to clopidogrel as an outpatient to complete at least 6 months of DAPT (ideally longer). 3. Aggressive secondary prevention of coronary artery disease. 4. Medical therapy of rPLA stenosis. Ronald Bush, MD [facility]   CT CORONARY [facility] W/CTA COR W/SCORE Lewanda Rife W/CM &/OR WO/CM  Addendum Date: 11/14/2020   ADDENDUM REPORT: 11/14/2020 08:53 CLINICAL DATA:  This is a 64 year  old male with chest pain. Medical history includes Hypertension, Hyperlipidemia, Type 2 Diabetes and Family history of premature CAD. EXAM: Cardiac/Coronary  CT TECHNIQUE: The patient was scanned on a [employer]. FINDINGS: A 120 kV prospective scan was triggered in the descending thoracic aorta at 111 HU's. Axial non-contrast 3 mm slices were carried out through the heart. The data set was analyzed on a dedicated work station and scored using the [place]. Gantry rotation speed was 250 msecs and collimation was .6 mm. No beta blockade and 0.8 mg of sl NTG was given. The 3D data set was reconstructed in 5% intervals of the 67-82 % of the R-R cycle. Diastolic phases were analyzed on a dedicated work station using MPR, MIP and VRT modes.  The patient received 80 cc of contrast. Aorta: Normal size.  No calcifications.  No dissection. Aortic Valve:  Trileaflet.  Mild diffuse calcifications. Coronary Arteries:  Normal coronary origin.  Right dominance. RCA is a large dominant artery that gives rise to PDA and PLVB. There is a mild (25-49%) calcified lesion in the proximal RCA. The Mid portion with no plaques. The distal RCA with mild (25-49%) soft plaque. Left main is a large artery that gives rise to LAD and LCX arteries. LAD is a large vessel. There is a long 7 mm mixed lesion which starts as a moderate (50-69%) plaques and tapers off which appears to be probably a greater than 70% stenosis. A second moderate lesion in the mid to distal LAD which is calcified. LCX is a non-dominant artery that gives rise to one large OM1 branch. There is a proximal napkin ring appearing soft plaque lesion which also appears to be greater than 70%. Mid Lcx with a moderate (50-69%) calcified lesion. Other findings: Normal pulmonary vein drainage into the left atrium. Normal left atrial appendage without a thrombus. Normal size of the pulmonary artery. IMPRESSION: 1. Multivessel Coronary Artery Disease, CADRADS 4A. This study will also be send for FFRct. Consider Further testing with Cardiac catheterization. 2. Coronary calcium score of 205. This was 70 percentile for age and sex matched control. 2. Normal coronary origin with right dominance. Ronald Salines, DO Electronically Signed   By: Ronald Salines DO   On: 11/14/2020 08:53   Result Date: 11/14/2020 EXAM: OVER-READ INTERPRETATION  CT CHEST The following report is an over-read performed by radiologist Dr. Vinnie Langton of [facility], [place] on 11/13/2020. This over-read does not include interpretation of cardiac or coronary anatomy or pathology. The coronary calcium score/coronary CTA interpretation by the cardiologist is attached. COMPARISON:  None. FINDINGS: Within the visualized portions of the thorax there  are no suspicious appearing pulmonary nodules or masses, there is no acute consolidative airspace disease, no pleural effusions, no pneumothorax and no lymphadenopathy. Visualized portions of the upper abdomen are unremarkable. There are no aggressive appearing lytic or blastic lesions noted in the visualized portions of the skeleton. IMPRESSION: No significant incidental noncardiac findings are noted. Electronically Signed: By: Vinnie Langton M.D. On: 11/13/2020 14:47   CT CORONARY FRACTIONAL FLOW RESERVE DATA PREP  Result Date: 11/14/2020 EXAM: FFRCT ANALYSIS FINDINGS: FFRct analysis was performed on the original cardiac CT angiogram dataset. Diagrammatic representation of the FFRct analysis is provided in a separate PDF document in PACS. This dictation was created using the PDF document and an interactive 3D model of the results. 3D model is not available in the EMR/PACS. Normal FFR range is >0.80. 1. Left Main:0.99 2. LAD: Proximal 0.99, Mid 0.62, Distal  0.57 3. TDD:UKGURKYH 0.98, Mid 0.73, Distal 0.68 4. RCA: Proximal 0.99, Mid 0.62, Distal 0.57 IMPRESSION: FFRct with multivessel flow limiting stenosis as listed above. Recommend cardiac Catheterization. Note: These examples are not recommendations of [employer] and only provided as examples of what other customers are doing. Electronically Signed   By: Ronald Salines DO   On: 11/14/2020 14:34   LONG TERM MONITOR (3-14 DAYS)  Result Date: 10/26/2020 The patient wore the monitor for 7 days starting on 10/02/2020.   . Indication: Palpitations The minimum heart rate was 45 bpm, maximum heart rate was 106 bpm, and average heart rate was 63  bpm. Predominant underlying rhythm was Sinus Rhythm. Premature atrial complexes were rare less than 1%. Premature Ventricular complexes were rare less than 1%. No ventricular tachycardia, no pauses, No AV block, no supraventricular tachycardia and no atrial fibrillation present. 3 patient triggered events into diary  events both all associated with sinus rhythm. Conclusion: Unremarkable/normal study with no evidence of arrhythmia.  Disposition   Pt is being discharged home today in good condition.  Follow-up Plans & Appointments     Follow-up Information    Tobb, Godfrey Pick, DO Follow up on 11/30/2020.   Specialty: Cardiology Why: at 1pm for your follow up appt.  Contact information: [address] [place] [address] [phone]              Discharge Instructions    AMB Referral to Advanced Lipid Disorders Clinic   Complete by: As directed    Reason for referral: Follow-up patients for medication management   Provider to see patient: PharmD   Internal Lipid Clinic Referral Scheduling  Internal lipid clinic referrals are providers within [facility], who wish to refer established patients for routine management (help in starting PCSK9 inhibitor therapy) or advanced therapies.  Internal MD referral criteria:              1. All patients with LDL>190 mg/dL  2. All patients with Triglycerides >500 mg/dL  3. Patients with suspected or confirmed heterozygous familial hyperlipidemia (HeFH) or homozygous familial hyperlipidemia (HoFH)  4. Patients with family history of suspicious for genetic dyslipidemia desiring genetic testing  5. Patients refractory to standard guideline based therapy  6. Patients with statin intolerance (failed 2 statins, one of which must be a high potency statin)  7. Patients who the provider desires to be seen by MD   Internal PharmD referral criteria:   1. Follow-up patients for medication management  2. Follow-up for compliance monitoring  3. Patients for drug education  4. Patients with statin intolerance  5. PCSK9 inhibitor education and prior authorization approvals  6. Patients with triglycerides <500 mg/dL  External Lipid Clinic Referral  External lipid clinic referrals are for providers outside of [facility], considered new clinic  patients - automatically routed to MD schedule   Amb Referral to Cardiac Rehabilitation   Complete by: As directed    Referring to [place] [facility]   Diagnosis: Coronary Stents   After initial evaluation and assessments completed: Virtual Based Care may be provided alone or in conjunction with Phase 2 Cardiac Rehab based on patient barriers.: Yes   Diet - low sodium heart healthy   Complete by: As directed    Discharge instructions   Complete by: As directed    Radial Site Care Refer to this sheet in the next few weeks. These instructions provide you with information on caring for yourself after your procedure. Your caregiver  may also give you more specific instructions. Your treatment has been planned according to current medical practices, but problems sometimes occur. Call your caregiver if you have any problems or questions after your procedure. HOME CARE INSTRUCTIONS You may shower the day after the procedure.Remove the bandage (dressing) and gently wash the site with plain soap and water.Gently pat the site dry.  Do not apply powder or lotion to the site.  Do not submerge the affected site in water for 3 to 5 days.  Inspect the site at least twice daily.  Do not flex or bend the affected arm for 24 hours.  No lifting over 5 pounds (2.3 kg) for 5 days after your procedure.  Do not drive home if you are discharged the same day of the procedure. Have someone else drive you.  You may drive 24 hours after the procedure unless otherwise instructed by your caregiver.  What to expect: Any bruising will usually fade within 1 to 2 weeks.  Blood that collects in the tissue (hematoma) may be painful to the touch. It should usually decrease in size and tenderness within 1 to 2 weeks.  SEEK IMMEDIATE MEDICAL CARE IF: You have unusual pain at the radial site.  You have redness, warmth, swelling, or pain at the radial site.  You have drainage (other than a small amount of blood on the dressing).    You have chills.  You have a fever or persistent symptoms for more than 72 hours.  You have a fever and your symptoms suddenly get worse.  Your arm becomes pale, cool, tingly, or numb.  You have heavy bleeding from the site. Hold pressure on the site.   PLEASE DO NOT MISS ANY DOSES OF YOUR BRILINTA!!!!! Also keep a log of you blood pressures and bring back to your follow up appt. Please call the office with any questions.   Patients taking blood thinners should generally stay away from medicines like ibuprofen, Advil, Motrin, naproxen, and Aleve due to risk of stomach bleeding. You may take Tylenol as directed or talk to your primary doctor about alternatives.  PLEASE ENSURE THAT YOU DO NOT RUN OUT OF YOUR BRILINTA/PLAVIX. This medication is very important to remain on for at least 73months. IF you have issues obtaining this medication due to cost please CALL the office 3-5 business days prior to running out in order to prevent missing doses of this medication.   Increase activity slowly   Complete by: As directed    No wound care   Complete by: As directed       Discharge Medications   Allergies as of 11/20/2020      Reactions   Niacin Anaphylaxis      Medication List    STOP taking these medications   metoprolol tartrate 100 MG tablet Commonly known as: LOPRESSOR     TAKE these medications   aspirin EC 81 MG tablet Take 1 tablet (81 mg total) by mouth daily. Swallow whole.   fenofibrate 160 MG tablet Take 1 tablet (160 mg total) by mouth daily. What changed: when to take this   gabapentin 800 MG tablet Commonly known as: Neurontin Take 1 tablet (800 mg total) by mouth 3 (three) times daily.   glimepiride 4 MG tablet Commonly known as: [name] Take 1 tablet (4 mg total) by mouth daily before breakfast.   glucose blood test strip Use as instructed   isosorbide mononitrate 30 MG 24 hr tablet Commonly known as: IMDUR Take 1  tablet (30 mg total) by mouth daily.    lisinopril 5 MG tablet Commonly known as: ZESTRIL TAKE 1 TABLET (5 MG TOTAL) BY MOUTH DAILY.   metFORMIN 500 MG 24 hr tablet Commonly known as: [name] Take 2 tablets (1,000 mg total) by mouth daily with breakfast AND 1 tablet (500 mg total) daily with supper. Start taking on: November 21, 2020 What changed: See the new instructions.   nitroGLYCERIN 0.4 MG SL tablet Commonly known as: NITROSTAT Place 1 tablet (0.4 mg total) under the tongue every 5 (five) minutes as needed for chest pain.   omeprazole 40 MG capsule Commonly known as: PRILOSEC 1 po bid What changed:   how much to take  how to take this  when to take this  additional instructions   ondansetron 4 MG tablet Commonly known as: ZOFRAN Take 1 tablet (4 mg total) by mouth every 8 (eight) hours as needed. What changed: reasons to take this   oxyCODONE-acetaminophen 5-325 MG tablet Commonly known as: PERCOCET/ROXICET TAKE 1 TABLET BY MOUTH EVERY 6 HOURS AS NEEDED What changed: when to take this   rosuvastatin 20 MG tablet Commonly known as: Crestor Take 1 tablet (20 mg total) by mouth daily. What changed: when to take this   sennosides-docusate sodium 8.6-50 MG tablet Commonly known as: SENOKOT-S Take 2 tablets by mouth daily.   sertraline 100 MG tablet Commonly known as: ZOLOFT Take 1 tablet (100 mg total) by mouth daily. What changed: when to take this   ticagrelor 90 MG Tabs tablet Commonly known as: BRILINTA Take 1 tablet (90 mg total) by mouth 2 (two) times daily.   tiZANidine 4 MG tablet Commonly known as: ZANAFLEX TAKE 1 TABLET (4 MG TOTAL) BY MOUTH EVERY 6 (SIX) HOURS AS NEEDED FOR MUSCLE SPASMS.   traZODone 50 MG tablet Commonly known as: DESYREL Take 0.5-1 tablets (25-50 mg total) by mouth at bedtime as needed for sleep.       Outstanding Labs/Studies   Outpatient lipid clinic referral done at discharge  Duration of Discharge Encounter   Greater than 30 minutes including  physician time.  Signed, Reino Bellis, NP 11/20/2020, 10:10 AM

## 2020-11-20 NOTE — Telephone Encounter (Signed)
-----   Message from Berniece Salines, DO sent at 11/20/2020  8:10 AM EST ----- Please schedule patient to see me while I'm in [facility] next week. Try calling in the patient in the afternoon because he will be discharged from the hospital today from getting a PCI yesterday.

## 2020-11-21 ENCOUNTER — Other Ambulatory Visit: Payer: Self-pay | Admitting: Family Medicine

## 2020-11-21 DIAGNOSIS — C801 Malignant (primary) neoplasm, unspecified: Secondary | ICD-10-CM | POA: Insufficient documentation

## 2020-11-21 DIAGNOSIS — E785 Hyperlipidemia, unspecified: Secondary | ICD-10-CM | POA: Insufficient documentation

## 2020-11-21 DIAGNOSIS — G709 Myoneural disorder, unspecified: Secondary | ICD-10-CM | POA: Insufficient documentation

## 2020-11-21 DIAGNOSIS — T8859XA Other complications of anesthesia, initial encounter: Secondary | ICD-10-CM | POA: Insufficient documentation

## 2020-11-21 DIAGNOSIS — F419 Anxiety disorder, unspecified: Secondary | ICD-10-CM | POA: Insufficient documentation

## 2020-11-21 DIAGNOSIS — K219 Gastro-esophageal reflux disease without esophagitis: Secondary | ICD-10-CM | POA: Insufficient documentation

## 2020-11-21 DIAGNOSIS — I10 Essential (primary) hypertension: Secondary | ICD-10-CM | POA: Insufficient documentation

## 2020-11-21 DIAGNOSIS — G8929 Other chronic pain: Secondary | ICD-10-CM

## 2020-11-21 DIAGNOSIS — M1990 Unspecified osteoarthritis, unspecified site: Secondary | ICD-10-CM | POA: Insufficient documentation

## 2020-11-21 DIAGNOSIS — G4730 Sleep apnea, unspecified: Secondary | ICD-10-CM | POA: Insufficient documentation

## 2020-11-21 MED FILL — ROSUVASTATIN CALCIUM 20 MG: 20 | 30 days supply | Qty: 30 | Fill #2

## 2020-11-21 NOTE — Telephone Encounter (Signed)
Requesting: oxycodone 5-325mg  Contract: 11/14/2019  UDS: 11/14/2019 Last Visit: 09/11/2020 Next Visit: None scheduled  Last Refill: 10/17/2020 #90 and 0RF Pt sig: 1 tab q6h prn  Please Advise

## 2020-11-26 ENCOUNTER — Ambulatory Visit (INDEPENDENT_AMBULATORY_CARE_PROVIDER_SITE_OTHER): Payer: Medicare HMO | Admitting: Cardiology

## 2020-11-26 ENCOUNTER — Other Ambulatory Visit: Payer: Self-pay

## 2020-11-26 ENCOUNTER — Encounter: Payer: Self-pay | Admitting: Cardiology

## 2020-11-26 ENCOUNTER — Other Ambulatory Visit: Payer: Self-pay | Admitting: Cardiology

## 2020-11-26 VITALS — BP 102/72 | HR 80 | Ht 72.0 in | Wt 215.1 lb

## 2020-11-26 DIAGNOSIS — G4733 Obstructive sleep apnea (adult) (pediatric): Secondary | ICD-10-CM | POA: Diagnosis not present

## 2020-11-26 DIAGNOSIS — E1151 Type 2 diabetes mellitus with diabetic peripheral angiopathy without gangrene: Secondary | ICD-10-CM

## 2020-11-26 DIAGNOSIS — I10 Essential (primary) hypertension: Secondary | ICD-10-CM

## 2020-11-26 DIAGNOSIS — E785 Hyperlipidemia, unspecified: Secondary | ICD-10-CM

## 2020-11-26 DIAGNOSIS — E1165 Type 2 diabetes mellitus with hyperglycemia: Secondary | ICD-10-CM

## 2020-11-26 DIAGNOSIS — IMO0002 Reserved for concepts with insufficient information to code with codable children: Secondary | ICD-10-CM

## 2020-11-26 DIAGNOSIS — I2510 Atherosclerotic heart disease of native coronary artery without angina pectoris: Secondary | ICD-10-CM

## 2020-11-26 DIAGNOSIS — Z9861 Coronary angioplasty status: Secondary | ICD-10-CM

## 2020-11-26 MED ORDER — RANOLAZINE ER 500 MG PO TB12: 500.0000 mg | ORAL_TABLET | Freq: Two times a day (BID) | ORAL | 2 refills | Status: DC

## 2020-11-26 MED FILL — OXYCODONE-ACETAMINOPHEN 5-3: 5-325 | 23 days supply | Qty: 90 | Fill #0

## 2020-11-26 MED FILL — RANOLAZINE ER 500 MG TB12: 500 | 30 days supply | Qty: 60 | Fill #0
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
## 2020-11-26 NOTE — Telephone Encounter (Signed)
Will refill x1 but pt due to ov in dec

## 2020-11-26 NOTE — Patient Instructions (Signed)
Medication Instructions:  Your physician has recommended you make the following change in your medication:   STOP: Imdur   START: Ranexa 500 mg twice daily   *If you need a refill on your cardiac medications before your next appointment, please call your pharmacy*   Lab Work: NONE  If you have labs (blood work) drawn today and your tests are completely normal, you will receive your results only by:  [place] (if you have MyChart) OR  A paper copy in the mail If you have any lab test that is abnormal or we need to change your treatment, we will call you to review the results.   Testing/Procedures: NONE   Follow-Up: At [facility], you and your health needs are our priority.  As part of our continuing mission to provide you with exceptional heart care, we have created designated Provider Care Teams.  These Care Teams include your primary Cardiologist (physician) and Advanced Practice Providers (APPs -  Physician Assistants and Nurse Practitioners) who all work together to provide you with the care you need, when you need it.  We recommend signing up for the patient portal called "MyChart".  Sign up information is provided on this After Visit Summary.  MyChart is used to connect with patients for Virtual Visits (Telemedicine).  Patients are able to view lab/test results, encounter notes, upcoming appointments, etc.  Non-urgent messages can be sent to your provider as well.   To learn more about what you can do with MyChart, go to [email or web address].    Your next appointment:   3 month(s)  The format for your next appointment:   In Person  Provider:   Berniece Salines, DO   Other Instructions  Ranolazine tablets, extended release What is this medicine? RANOLAZINE (ra NOE la zeen) is a heart medicine. It is used to treat chronic chest pain (angina). This medicine must be taken regularly. It will not relieve an acute episode of chest pain. This medicine may be used  for other purposes; ask your health care provider or pharmacist if you have questions. COMMON BRAND NAME(S): Ranexa What should I tell my health care provider before I take this medicine? They need to know if you have any of these conditions:  heart disease  irregular heartbeat  kidney disease  liver disease  low levels of potassium or magnesium in the blood  an unusual or allergic reaction to ranolazine, other medicines, foods, dyes, or preservatives  pregnant or trying to get pregnant  breast-feeding How should I use this medicine? Take this medicine by mouth with a glass of water. Follow the directions on the prescription label. Do not cut, crush, or chew this medicine. Take with or without food. Do not take this medication with grapefruit juice. Take your doses at regular intervals. Do not take your medicine more often then directed. Talk to your pediatrician regarding the use of this medicine in children. Special care may be needed. Overdosage: If you think you have taken too much of this medicine contact a poison control center or emergency room at once. NOTE: This medicine is only for you. Do not share this medicine with others. What if I miss a dose? If you miss a dose, take it as soon as you can. If it is almost time for your next dose, take only that dose. Do not take double or extra doses. What may interact with this medicine? Do not take this medicine with any of the following medications:  antivirals for HIV  or AIDS  cerivastatin  certain antibiotics like chloramphenicol, clarithromycin, dalfopristin; quinupristin, isoniazid, rifabutin, rifampin, rifapentine  certain medicines used for cancer like imatinib, nilotinib  certain medicines for fungal infections like fluconazole, itraconazole, ketoconazole, posaconazole, voriconazole  certain medicines for irregular heart beat like dronedarone  certain medicines for seizures like carbamazepine, fosphenytoin,  oxcarbazepine, phenobarbital, phenytoin  cisapride  conivaptan  cyclosporine  grapefruit or grapefruit juice  lumacaftor; ivacaftor  nefazodone  pimozide  quinacrine  St John's wort  thioridazine This medicine may also interact with the following medications:  alfuzosin  certain medicines for depression, anxiety, or psychotic disturbances like bupropion, citalopram, fluoxetine, fluphenazine, paroxetine, perphenazine, risperidone, sertraline, trifluoperazine  certain medicines for cholesterol like atorvastatin, lovastatin, simvastatin  certain medicines for stomach problems like octreotide, palonosetron, prochlorperazine  eplerenone  ergot alkaloids like dihydroergotamine, ergonovine, ergotamine, methylergonovine  metformin  nicardipine  other medicines that prolong the QT interval (cause an abnormal heart rhythm) like dofetilide, ziprasidone  sirolimus  tacrolimus This list may not describe all possible interactions. Give your health care provider a list of all the medicines, herbs, non-prescription drugs, or dietary supplements you use. Also tell them if you smoke, drink alcohol, or use illegal drugs. Some items may interact with your medicine. What should I watch for while using this medicine? Visit your doctor for regular check ups. Tell your doctor or healthcare professional if your symptoms do not start to get better or if they get worse. This medicine will not relieve an acute attack of angina or chest pain. This medicine can change your heart rhythm. Your health care provider may check your heart rhythm by ordering an electrocardiogram (ECG) while you are taking this medicine. You may get drowsy or dizzy. Do not drive, use machinery, or do anything that needs mental alertness until you know how this medicine affects you. Do not stand or sit up quickly, especially if you are an older patient. This reduces the risk of dizzy or fainting spells. Alcohol may  interfere with the effect of this medicine. Avoid alcoholic drinks. If you are scheduled for any medical or dental procedure, tell your healthcare provider that you are taking this medicine. This medicine can interact with other medicines used during surgery. What side effects may I notice from receiving this medicine? Side effects that you should report to your doctor or health care professional as soon as possible:  allergic reactions like skin rash, itching or hives, swelling of the face, lips, or tongue  breathing problems  changes in vision  fast, irregular or pounding heartbeat  feeling faint or lightheaded, falls  low or high blood pressure  numbness or tingling feelings  ringing in the ears  tremor or shakiness  slow heartbeat (fewer than 50 beats per minute)  swelling of the legs or feet Side effects that usually do not require medical attention (report to your doctor or health care professional if they continue or are bothersome):  constipation  drowsy  dry mouth  headache  nausea or vomiting  stomach upset This list may not describe all possible side effects. Call your doctor for medical advice about side effects. You may report side effects to FDA at 1-800-FDA-1088. Where should I keep my medicine? Keep out of the reach of children. Store at room temperature between 15 and 30 degrees C (59 and 86 degrees F). Throw away any unused medicine after the expiration date. NOTE: This sheet is a summary. It may not cover all possible information. If you have questions about  this medicine, talk to your doctor, pharmacist, or health care provider.  2020 Elsevier/Gold Standard (2018-12-07 09:18:49)

## 2020-11-27 ENCOUNTER — Telehealth: Payer: Self-pay | Admitting: Cardiology

## 2020-11-27 NOTE — Telephone Encounter (Signed)
Called patient. Informed him to avoid lifting anything more than 10 pounds. He verbally understood.

## 2020-11-27 NOTE — Telephone Encounter (Signed)
Ronald Petersen saw Dr. Harriet Masson on 11/29 and AVS calls for a 3 month follow up. Ronald Petersen also has an appointment scheduled with Dr. Harriet Masson on 11/30/2020. He has to cancel due to being out of town - but he is unsure on whether he needed the appointment on 12/03 or if he is fine to wait until late February for a follow up. Please call/advise  Thank you!

## 2020-11-27 NOTE — Telephone Encounter (Signed)
Please asked the patient to avoid lifting greater than 10 pounds until I see him next.  February is just fine.

## 2020-11-27 NOTE — Telephone Encounter (Signed)
Called patient. Informed him he needs 3 month follow up not appointment in December. He understood. He wants to know if Dr. Harriet Masson thinks he needs to follow any physical restrictions. Will check with her.

## 2020-11-29 ENCOUNTER — Telehealth (HOSPITAL_COMMUNITY): Payer: Self-pay

## 2020-11-29 NOTE — Telephone Encounter (Signed)
Faxed cardiac rehab referral to [facility] per Phase I.

## 2020-11-30 ENCOUNTER — Ambulatory Visit: Payer: Medicare HMO | Admitting: Cardiology

## 2020-11-30 LAB — CYTOCHROME P450 2C19

## 2020-12-10 ENCOUNTER — Other Ambulatory Visit: Payer: Self-pay | Admitting: Family Medicine

## 2020-12-10 DIAGNOSIS — G4700 Insomnia, unspecified: Secondary | ICD-10-CM

## 2020-12-10 DIAGNOSIS — I10 Essential (primary) hypertension: Secondary | ICD-10-CM

## 2020-12-10 MED FILL — GLIMEPIRIDE 4 MG TABLET: 4 | 90 days supply | Qty: 90 | Fill #2

## 2020-12-10 MED FILL — LISINOPRIL 5 MG TABLET: 5 | 90 days supply | Qty: 90 | Fill #0

## 2020-12-10 MED FILL — traZODone HCL 50 MG TABS: 50 | 30 days supply | Qty: 30 | Fill #0

## 2020-12-11 ENCOUNTER — Ambulatory Visit (INDEPENDENT_AMBULATORY_CARE_PROVIDER_SITE_OTHER): Payer: Medicare HMO | Admitting: Pharmacist Clinician (PhC)/ Clinical Pharmacy Specialist

## 2020-12-11 ENCOUNTER — Other Ambulatory Visit: Payer: Self-pay | Admitting: Cardiology

## 2020-12-11 ENCOUNTER — Other Ambulatory Visit: Payer: Self-pay

## 2020-12-11 ENCOUNTER — Encounter: Payer: Self-pay | Admitting: Pharmacist Clinician (PhC)/ Clinical Pharmacy Specialist

## 2020-12-11 DIAGNOSIS — E1169 Type 2 diabetes mellitus with other specified complication: Secondary | ICD-10-CM | POA: Diagnosis not present

## 2020-12-11 DIAGNOSIS — E785 Hyperlipidemia, unspecified: Secondary | ICD-10-CM | POA: Diagnosis not present

## 2020-12-11 MED ORDER — ROSUVASTATIN CALCIUM 40 MG PO TABS: 40.0000 mg | ORAL_TABLET | Freq: Every day | ORAL | 3 refills | Status: DC

## 2020-12-11 MED FILL — ROSUVASTATIN CALCIUM 40 MG: 40 | 90 days supply | Qty: 90 | Fill #0

## 2020-12-11 NOTE — Patient Instructions (Signed)
Your Results:             Your most recent labs Goal  Total Cholesterol 157 < 200  Triglycerides 174 < 150  HDL (happy/good cholesterol) 34 > 40  LDL (lousy/bad cholesterol 96 < 70     Medication changes:  Increase rosuvastatin to 40 mg once daily.  If this causes any side effects, please stop and call the office to let us know.  Dylyn Mclaren/Raquel at [phone]  Lab orders:  We will repeat cholesterol labs in about 8-10 weeks.  We will mail a lab order to you about the first part of February.    Patient Assistance:  The [employer] foundation offers assistance to help pay for medication copays.  They will cover copays for all cholesterol lowering meds, including statins, fibrates, omega-3 oils, ezetimibe, Repatha, Praluent, Nexletol, Nexlizet.  The cards are usually good for $2,500 or 12 months, whichever comes first. 1. Go to healthwellfoundation.org 2. Click on "Apply Now" 3. Answer questions as to whom is applying (patient or representative) 4. Your disease fund will be "hypercholesterolemia - Medicare access" 5. They will ask questions about finances and which medications you are taking for cholesterol 6. When you submit, the approval is usually within minutes.  You will need to print the card information from the site 7. You will need to show this information to your pharmacy, they will bill your Medicare Part D plan first -then bill [employer] --for the copay.   You can also call them at [phone], although the hold times can be quite long.   Thank you for choosing [facility]

## 2020-12-11 NOTE — Assessment & Plan Note (Addendum)
Patient with ASCVD, DM2 and elevated LDL cholesterol.  Goal LDL < 70.  Patient has been on rosuvastatin 20 mg daily for > 1 year.  Has never had dose increase.  Reviewed options for lowering LDL cholesterol, including ezetimibe, PCSK-9 inhibitors and bempedoic acid.  Discussed mechanisms of action, dosing, side effects and potential decreases in LDL cholesterol.  Answered all patient questions.  Based on his history, will start by increasing rosuvastatin to 40 mg daily.  He is currently having trouble affording Brilinta, so adding Repatha or Nexletol would be counter-productive at this time.  He will increase the rosuvastatin, but knows to call the office if he should develop any myalgias or other issues.  We will repeat lipid labs after 8-10 weeks and at that time can determine if further treatment is warranted.  Also encouraged patient to take fenofibrate with a meal, as in several formulations, this will increase absorption.

## 2020-12-11 NOTE — Progress Notes (Signed)
12/11/2020 WADELL CRADDOCK [birth year] [id number]   HPI:  Ronald Petersen is a 64 y.o. male patient of Dr Harriet Masson, who presents today for a lipid clinic evaluation.  See pertinent past medical history below.   Mr. Vangorder was recently hospitalized for an episode of unstable angina at which time a heart cath showed 80-90% blockages in LAD and OM1.  Three stents were placed and patient was started on Brilinta and aspirin.  Unfortunately Brilinta is not an approved drug on his Medicare formulary and we are trying to get paperwork to [employer] to see if he qualifies for patient assistance.    Since hospitalization he reports occasional twinges in his chest, but no overt chest pain.  He has been on rosuvastatin for over a year and has no recollection of ever trying a higher dose.  His triglyceride levels are also slightly elevated on fenofibrate, but he notes that he does not always take this with food.    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  Sister died at 24 ovarian cancer; 2 children, no heart disease yet; 27, 9  Diet: doesn't eat out, mostly home cooked, working on healthy options; no salt, lean meats, more fish - wife is doing all the cooking to get him healthy foods  Exercise:  No regular exercise, walks regularly around property  Labs: 9/21 - TC 157, TG 174, HDL 34, LDL 96   Current Outpatient Medications  Medication Sig Dispense Refill  . aspirin EC  81 MG tablet Take 1 tablet (81 mg total) by mouth daily. Swallow whole. 90 tablet 3  . fenofibrate 160 MG tablet Take 1 tablet (160 mg total) by mouth daily. (Patient taking differently: Take 160 mg by mouth daily at 12 noon. ) 90 tablet 1  . gabapentin (NEURONTIN) 800 MG tablet Take 1 tablet (800 mg total) by mouth 3 (three) times daily. 270 tablet 3  . glimepiride ([name]) 4 MG tablet Take 1 tablet (4 mg total) by mouth daily before breakfast. 90 tablet 3  . glucose blood test strip Use as instructed 100 each 12  . lisinopril (ZESTRIL) 5 MG tablet Take 1 tablet (5 mg total) by mouth daily. 90 tablet 1  . metFORMIN ([name]) 500 MG 24 hr tablet Take 2 tablets (1,000 mg total) by mouth daily with breakfast AND 1 tablet (500 mg total) daily with supper. 270 tablet 3  . nitroGLYCERIN (NITROSTAT) 0.4 MG SL tablet Place 1 tablet (0.4 mg total) under the tongue every 5 (five) minutes as needed for chest pain. 90 tablet 3  . omeprazole (PRILOSEC) 40 MG capsule 1 po bid (Patient taking differently: Take 80 mg by mouth at bedtime. ) 180 capsule 3  . ondansetron (ZOFRAN) 4 MG tablet Take 1 tablet (4 mg total) by mouth every 8 (eight) hours as needed. (Patient taking differently: Take 4 mg by mouth every 8 (eight) hours as  needed for nausea or vomiting. ) 20 tablet 2  . oxyCODONE-acetaminophen (PERCOCET/ROXICET) 5-325 MG tablet TAKE 1 TABLET BY MOUTH EVERY 6 HOURS AS NEEDED 90 tablet 0  . ranolazine (RANEXA) 500 MG 12 hr tablet Take 1 tablet (500 mg total) by mouth 2 (two) times daily. 60 tablet 2  . rosuvastatin (CRESTOR) 40 MG tablet Take 1 tablet (40 mg total) by mouth daily. 90 tablet 3  . sennosides-docusate sodium (SENOKOT-S) 8.6-50 MG tablet Take 2 tablets by mouth daily. 30 tablet 1  . sertraline (ZOLOFT) 100 MG tablet Take 1 tablet (100 mg total) by mouth daily. (Patient taking differently: Take 100 mg by mouth at bedtime. ) 90 tablet 1  . ticagrelor (BRILINTA) 90 MG TABS tablet Take 1 tablet (90  mg total) by mouth 2 (two) times daily. 180 tablet 1  . tiZANidine (ZANAFLEX) 4 MG tablet TAKE 1 TABLET (4 MG TOTAL) BY MOUTH EVERY 6 (SIX) HOURS AS NEEDED FOR MUSCLE SPASMS. 30 tablet 1  . traZODone (DESYREL) 50 MG tablet Take 0.5-1 tablets (25-50 mg total) by mouth at bedtime as needed for sleep. 30 tablet 3   No current facility-administered medications for this visit.    Allergies  Allergen Reactions  . Niacin Anaphylaxis    Past Medical History:  Diagnosis Date  . Anxiety   . Arthritis   . Cancer ([place])   . Complication of anesthesia    aspirated with back surgery at age 79  . Depression   . Diabetes mellitus   . GERD (gastroesophageal reflux disease)   . Hyperlipidemia   . Hypertension   . Neuromuscular disorder ([place])    NEUROPATHY  . Right rotator cuff tear 11/23/2018  . Sleep apnea    no CPAP    There were no vitals taken for this visit.   Hyperlipidemia associated with type 2 diabetes mellitus ([place]) Patient with ASCVD, DM2 and elevated LDL cholesterol.  Goal LDL < 70.  Patient has been on rosuvastatin 20 mg daily for > 1 year.  Has never had dose increase.  Reviewed options for lowering LDL cholesterol, including ezetimibe, PCSK-9 inhibitors and bempedoic acid.  Discussed mechanisms of action, dosing, side effects and potential decreases in LDL cholesterol.  Answered all patient questions.  Based on his history, will start by increasing rosuvastatin to 40 mg daily.  He is currently having trouble affording Brilinta, so adding Repatha or Nexletol would be counter-productive at this time.  He will increase the rosuvastatin, but knows to call the office if he should develop any myalgias or other issues.  We will repeat lipid labs after 8-10 weeks and at that time can determine if further treatment is warranted.  Also encouraged patient to take fenofibrate with a meal, as in several formulations, this will increase absorption.     Tommy Medal PharmD CPP [facility] [address] [place], [place] [address] [phone]

## 2020-12-14 ENCOUNTER — Telehealth: Payer: Self-pay | Admitting: Cardiology

## 2020-12-14 ENCOUNTER — Other Ambulatory Visit: Payer: Self-pay | Admitting: Cardiology

## 2020-12-14 MED ORDER — RANOLAZINE ER 1000 MG PO TB12
1000.0000 mg | ORAL_TABLET | Freq: Two times a day (BID) | ORAL | 3 refills | Status: DC
Start: 2020-12-14 — End: 2021-01-17

## 2020-12-14 NOTE — Telephone Encounter (Signed)
    Pt c/o of Chest Pain: STAT if CP now or developed within 24 hours  1. Are you having CP right now? No  2. Are you experiencing any other symptoms (ex. SOB, nausea, vomiting, sweating)? None  3. How long have you been experiencing CP? Since tuesday  4. Is your CP continuous or coming and going? Coming and going   5. Have you taken Nitroglycerin? 1 last night  Pt said since Tuesday he feels a mild CP, he said it comes and goes and he feels it after doing something. He said he took nitroglycerin last night it did help a little bit but it did not completely relieve the pain ?

## 2020-12-14 NOTE — Telephone Encounter (Signed)
Please have him increase Ranexa to 1000 mg twice daily.

## 2020-12-14 NOTE — Telephone Encounter (Signed)
Recommendation as per Dr. Harriet Masson. Pt verbalized understanding and had no questions. Pt advised to call 911 or go to the ED.

## 2020-12-14 NOTE — Addendum Note (Signed)
Addended by: Truddie Hidden on: 12/14/2020 05:44 PM   Modules accepted: Orders

## 2020-12-17 ENCOUNTER — Telehealth: Payer: Self-pay

## 2020-12-17 ENCOUNTER — Other Ambulatory Visit: Payer: Self-pay | Admitting: Family Medicine

## 2020-12-17 MED FILL — RANOLAZINE ER 1000 MG TB12: 1000 | 30 days supply | Qty: 60 | Fill #0

## 2020-12-17 NOTE — Telephone Encounter (Signed)
PA started on CMM for Ranolazine 1000 mg. Key: [id number]

## 2020-12-18 ENCOUNTER — Telehealth: Payer: Self-pay | Admitting: Cardiology

## 2020-12-18 NOTE — Telephone Encounter (Signed)
PA approved for Ranexa 1000 mg from 05-29-20 to 12-28-21.

## 2020-12-18 NOTE — Telephone Encounter (Signed)
Spoke with pt and advised that his PA was approved for his Ranexa. Pt verbalized understanding and had no additional questions.

## 2020-12-18 NOTE — Telephone Encounter (Signed)
New Message  Pt is returning call, not sure who called   Please call back

## 2020-12-23 ENCOUNTER — Other Ambulatory Visit: Payer: Self-pay

## 2020-12-23 ENCOUNTER — Emergency Department (HOSPITAL_BASED_OUTPATIENT_CLINIC_OR_DEPARTMENT_OTHER): Payer: Medicare HMO

## 2020-12-23 ENCOUNTER — Observation Stay (HOSPITAL_BASED_OUTPATIENT_CLINIC_OR_DEPARTMENT_OTHER)
Admission: EM | Admit: 2020-12-23 | Discharge: 2020-12-24 | Disposition: A | Discharge status: Home / Self Care | Payer: Medicare HMO | Attending: Internal Medicine | Admitting: Internal Medicine

## 2020-12-23 DIAGNOSIS — I10 Essential (primary) hypertension: Secondary | ICD-10-CM | POA: Diagnosis not present

## 2020-12-23 DIAGNOSIS — Z955 Presence of coronary angioplasty implant and graft: Secondary | ICD-10-CM | POA: Diagnosis not present

## 2020-12-23 DIAGNOSIS — I25110 Atherosclerotic heart disease of native coronary artery with unstable angina pectoris: Principal | ICD-10-CM | POA: Insufficient documentation

## 2020-12-23 DIAGNOSIS — Z79899 Other long term (current) drug therapy: Secondary | ICD-10-CM | POA: Diagnosis not present

## 2020-12-23 DIAGNOSIS — R0602 Shortness of breath: Secondary | ICD-10-CM | POA: Diagnosis not present

## 2020-12-23 DIAGNOSIS — Z7982 Long term (current) use of aspirin: Secondary | ICD-10-CM | POA: Diagnosis not present

## 2020-12-23 DIAGNOSIS — Z7984 Long term (current) use of oral hypoglycemic drugs: Secondary | ICD-10-CM | POA: Insufficient documentation

## 2020-12-23 DIAGNOSIS — R079 Chest pain, unspecified: Secondary | ICD-10-CM | POA: Diagnosis not present

## 2020-12-23 DIAGNOSIS — I2510 Atherosclerotic heart disease of native coronary artery without angina pectoris: Secondary | ICD-10-CM

## 2020-12-23 DIAGNOSIS — J811 Chronic pulmonary edema: Secondary | ICD-10-CM | POA: Diagnosis not present

## 2020-12-23 DIAGNOSIS — E785 Hyperlipidemia, unspecified: Secondary | ICD-10-CM | POA: Diagnosis present

## 2020-12-23 DIAGNOSIS — E119 Type 2 diabetes mellitus without complications: Secondary | ICD-10-CM | POA: Diagnosis not present

## 2020-12-23 DIAGNOSIS — I517 Cardiomegaly: Secondary | ICD-10-CM | POA: Diagnosis not present

## 2020-12-23 DIAGNOSIS — R0789 Other chest pain: Secondary | ICD-10-CM | POA: Diagnosis not present

## 2020-12-23 DIAGNOSIS — Z20822 Contact with and (suspected) exposure to covid-19: Secondary | ICD-10-CM | POA: Insufficient documentation

## 2020-12-23 DIAGNOSIS — Z9861 Coronary angioplasty status: Secondary | ICD-10-CM

## 2020-12-23 DIAGNOSIS — I200 Unstable angina: Secondary | ICD-10-CM | POA: Diagnosis present

## 2020-12-23 LAB — BASIC METABOLIC PANEL
Anion gap: 10 (ref 5–15)
BUN: 30 mg/dL — ABNORMAL HIGH (ref 8–23)
CO2: 24 mmol/L (ref 22–32)
Calcium: 9.6 mg/dL (ref 8.9–10.3)
Chloride: 100 mmol/L (ref 98–111)
Creatinine, Ser: 0.91 mg/dL (ref 0.61–1.24)
GFR, Estimated: >60 mL/min (ref >60)
Glucose, Bld: 120 mg/dL — ABNORMAL HIGH (ref 70–99)
Potassium: 3.8 mmol/L (ref 3.5–5.1)
Sodium: 134 mmol/L — ABNORMAL LOW (ref 135–145)

## 2020-12-23 LAB — RESP PANEL BY RT-PCR (FLU A&B, COVID) ARPGX2
Influenza A by PCR: NEGATIVE
Influenza B by PCR: NEGATIVE
SARS Coronavirus 2 by RT PCR: NEGATIVE

## 2020-12-23 LAB — CBC
HCT: 41.3 % (ref 39.0–52.0)
Hemoglobin: 14.5 g/dL (ref 13.0–17.0)
MCH: 29.2 pg (ref 26.0–34.0)
MCHC: 35.1 g/dL (ref 30.0–36.0)
MCV: 83.1 fL (ref 80.0–100.0)
Platelets: 221 10*3/uL (ref 150–400)
RBC: 4.97 MIL/uL (ref 4.22–5.81)
RDW: 12.4 % (ref 11.5–15.5)
WBC: 9.9 10*3/uL (ref 4.0–10.5)
nRBC: 0 % (ref 0.0–0.2)

## 2020-12-23 LAB — TROPONIN I (HIGH SENSITIVITY): Troponin I (High Sensitivity): 6 ng/L (ref <18)

## 2020-12-23 MED ORDER — NITROGLYCERIN 0.4 MG SL SUBL: 0.4000 mg | SUBLINGUAL_TABLET | SUBLINGUAL | Status: DC | PRN

## 2020-12-23 MED ORDER — MORPHINE SULFATE (PF) 2 MG/ML IV SOLN
2.0000 mg | INTRAVENOUS | Status: DC | PRN
  Administered 2020-12-23: 2 mg via INTRAVENOUS
  Filled 2020-12-23: qty 1

## 2020-12-23 MED ORDER — ASPIRIN 81 MG PO CHEW
162.0000 mg | CHEWABLE_TABLET | Freq: Once | ORAL | Status: AC
  Administered 2020-12-23: 22:00:00 162 mg via ORAL
  Filled 2020-12-23: qty 2

## 2020-12-23 MED ORDER — METOPROLOL TARTRATE 25 MG PO TABS
25.0000 mg | ORAL_TABLET | Freq: Once | ORAL | Status: AC
  Administered 2020-12-23: 25 mg via ORAL
  Filled 2020-12-23: qty 1

## 2020-12-23 NOTE — ED Triage Notes (Addendum)
Reports chest pain since 0400. He has taken NTG without relief. Reports he had cardiac stents placed 5 weeks ago

## 2020-12-23 NOTE — ED Provider Notes (Signed)
[facility] EMERGENCY DEPARTMENT Provider Note   CSN: [id number] Arrival date & time: 12/23/20  2131     History Chief Complaint  Patient presents with  . Chest Pain    Ronald Petersen is a 64 y.o. male.  64 yo M with a cc of chest pain.  Going on since this morning.  Woke him up from sleep patient was feeling an ache that radiated to his left shoulder and right side of his chest.  Had some diaphoresis upon wakening as well.  Caused him to be short of breath.  Since then its been persistent and has gotten worse off and on.  Has tried nitro without significant improvement.  He recently was in the hospital and found to have diffuse coronary disease but had 2 lesions that were stented.  This was about 6 weeks ago.  He denies having any pain with this event.  Denies cough congestion or fever.  Denies abdominal pain.  Has been able to eat and drink without issue.  The history is provided by the patient.  Chest Pain Pain location:  Substernal area Pain quality: aching, dull and pressure   Pain radiates to:  L arm, L shoulder and R shoulder Pain severity:  Moderate Onset quality:  Gradual Duration:  16 hours Timing:  Constant Progression:  Worsening Chronicity:  New Associated symptoms: diaphoresis, fatigue and shortness of breath   Associated symptoms: no abdominal pain, no fever, no headache, no palpitations and no vomiting        Past Medical History:  Diagnosis Date  . Anxiety   . Arthritis   . Cancer (HCC)   . Complication of anesthesia    aspirated with back surgery at age 31  . Depression   . Diabetes mellitus   . GERD (gastroesophageal reflux disease)   . Hyperlipidemia   . Hypertension   . Neuromuscular disorder (HCC)    NEUROPATHY  . Right rotator cuff tear 11/23/2018  . Sleep apnea    no CPAP    Patient Active Problem List   Diagnosis Date Noted  . Anxiety   . Arthritis   . Cancer (HCC)   . Complication of anesthesia   . GERD  (gastroesophageal reflux disease)   . Hyperlipidemia   . Hypertension   . Neuromuscular disorder (HCC)   . Sleep apnea   . Coronary artery disease involving native coronary artery of native heart with angina pectoris (HCC) 11/20/2020  . CAD S/P DES PCI LAD & LCx-OM 11/20/2020  . Unstable angina (HCC) 11/19/2020  . Abnormal cardiac CT angiography 11/19/2020  . Shortness of breath 10/02/2020  . Dizziness 10/02/2020  . Chest pain of uncertain etiology 10/02/2020  . Obesity (BMI 30-39.9) 10/02/2020  . Type 2 diabetes mellitus with diabetic neuropathy, without long-term current use of insulin (HCC) 06/06/2020  . Uncontrolled type 2 diabetes mellitus with hyperglycemia (HCC) 05/31/2020  . Blurry vision 02/07/2020  . Spinal stenosis of lumbar region 02/07/2020  . Frequent falls 02/07/2020  . Foraminal stenosis of cervical region 07/26/2019  . Aortic atherosclerosis (HCC) 05/27/2019  . Chronic bilateral low back pain without sciatica 05/20/2019  . Carpal tunnel syndrome, bilateral 05/20/2019  . Other spondylosis with radiculopathy, cervical region 05/20/2019  . Leg weakness, bilateral 01/05/2019  . Falling episodes 01/05/2019  . Right rotator cuff tear 11/23/2018  . Neck pain 11/04/2018  . Acute pain of right shoulder 11/04/2018  . Gastroesophageal reflux disease 11/04/2018  . Hyperlipidemia associated with type 2 diabetes mellitus (  HCC) 11/04/2018  . 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 Essential hypertension 09/14/2017  . Preventative health care 06/12/2016  . OSA (obstructive sleep apnea) 04/07/2016  . Joint pain 04/05/2016  . NSAID long-term use 12/13/2015  . Nausea without vomiting 12/13/2015  . History of colonic polyps 12/13/2015  . Loss of weight 12/13/2015  . Depression 11/06/2015  . Metatarsal deformity 02/20/2015  . Equinus deformity of foot, acquired 02/20/2015  . Plantar fasciitis, bilateral 02/15/2015  . Left wrist injury  06/15/2014  . Peripheral neuropathy 04/01/2012  . Fatigue 11/05/2010  . OTHER SPECIFIED DISORDER OF PENIS 03/26/2010  . NAUSEA 03/26/2010  . Pain in limb 10/17/2008  . NECK PAIN, CHRONIC 11/24/2007  . DM (diabetes mellitus) type II uncontrolled, periph vascular disorder (HCC) 01/27/2007  . Hyperlipidemia LDL goal <70 01/27/2007    Past Surgical History:  Procedure Laterality Date  . APPENDECTOMY    . ARTHOSCOPIC ROTAOR CUFF REPAIR Right 11/23/2018   Procedure: ARTHROSCOPIC ROTATOR CUFF REPAIR;  Surgeon: Teryl Lucy, MD;  Location: [facility];  Service: Orthopedics;  Laterality: Right;  . CHOLECYSTECTOMY    . CORONARY STENT INTERVENTION N/A 11/19/2020   Procedure: CORONARY STENT INTERVENTION;  Surgeon: Yvonne Kendall, MD;  Location: [facility];  Service: Cardiovascular;  Laterality: N/A;  . INTRAVASCULAR ULTRASOUND/IVUS N/A 11/19/2020   Procedure: Intravascular Ultrasound/IVUS;  Surgeon: Yvonne Kendall, MD;  Location: [facility];  Service: Cardiovascular;  Laterality: N/A;  . LEFT HEART CATH AND CORONARY ANGIOGRAPHY N/A 11/19/2020   Procedure: LEFT HEART CATH AND CORONARY ANGIOGRAPHY;  Surgeon: Yvonne Kendall, MD;  Location: [facility];  Service: Cardiovascular;  Laterality: N/A;  . LUMBAR LAMINECTOMY    . SHOULDER ARTHROSCOPY WITH ROTATOR CUFF REPAIR AND SUBACROMIAL DECOMPRESSION Right 11/23/2018   Procedure: SHOULDER ARTHROSCOPY WITH ROTATOR CUFF REPAIR AND SUBACROMIAL DECOMPRESSION;  Surgeon: Teryl Lucy, MD;  Location:  SURGERY CENTER;  Service: Orthopedics;  Laterality: Right;       Family History  Problem Relation Age of Onset  . Ovarian cancer Sister   . Stomach cancer Sister   . Heart disease Sister        MI  . Heart disease Sister        MI  . Alcohol abuse Other   . Depression Other   . Arthritis Other   . Hypertension Other   . Stomach cancer Maternal Grandmother   . COPD Mother   . Stroke Father   .  Coronary artery disease Other   . Ovarian cancer Other        neice  . Ovarian cancer Other        neice  . Colon cancer Other     Social History   Tobacco Use  . Smoking status: Former Smoker    Years: 16.00    Types: Cigarettes    Quit date: 12/29/1985    Years since quitting: 35.0  . Smokeless tobacco: Never Used  Vaping Use  . Vaping Use: Never used  Substance Use Topics  . Alcohol use: Yes    Alcohol/week: 4.0 - 5.0 standard drinks    Types: 4 - 5 Cans of beer per week    Comment: 5 times per week  . Drug use: No    Home Medications Prior to Admission medications   Medication Sig Start Date End Date Taking? Authorizing Provider  aspirin EC 81 MG tablet Take 1 tablet (81 mg total)  by mouth daily. Swallow whole. 11/14/20   Tobb, Kardie, DO  fenofibrate 160 MG tablet Take 1 tablet (160 mg total) by mouth daily. Patient taking differently: Take 160 mg by mouth daily at 12 noon.  09/06/20   [name], DO  gabapentin (NEURONTIN) 800 MG tablet Take 1 tablet (800 mg total) by mouth 3 (three) times daily. 09/25/20   Hyatt, Max T, DPM  glimepiride ([name]) 4 MG tablet Take 1 tablet (4 mg total) by mouth daily before breakfast. 06/06/20   Shamleffer, Melanie Crazier, MD  glucose blood test strip Use as instructed 05/19/19   [name], Alferd Apa, DO  lisinopril (ZESTRIL) 5 MG tablet Take 1 tablet (5 mg total) by mouth daily. 12/10/20   [name], DO  metFORMIN ([name]) 500 MG 24 hr tablet Take 2 tablets (1,000 mg total) by mouth daily with breakfast AND 1 tablet (500 mg total) daily with supper. 11/21/20   End, Harrell Gave, MD  nitroGLYCERIN (NITROSTAT) 0.4 MG SL tablet Place 1 tablet (0.4 mg total) under the tongue every 5 (five) minutes as needed for chest pain. 10/02/20 12/31/20  Tobb, Kardie, DO  omeprazole (PRILOSEC) 40 MG capsule 1 po bid Patient taking differently: Take 80 mg by mouth at bedtime.  06/12/20   Roma Schanz R, DO  ondansetron (ZOFRAN)  4 MG tablet Take 1 tablet (4 mg total) by mouth every 8 (eight) hours as needed. Patient taking differently: Take 4 mg by mouth every 8 (eight) hours as needed for nausea or vomiting.  05/31/20   Roma Schanz R, DO  oxyCODONE-acetaminophen (PERCOCET/ROXICET) 5-325 MG tablet TAKE 1 TABLET BY MOUTH EVERY 6 HOURS AS NEEDED 11/26/20   [name], Alferd Apa, DO  ranolazine (RANEXA) 1000 MG SR tablet Take 1 tablet (1,000 mg total) by mouth 2 (two) times daily. 12/14/20   Tobb, Kardie, DO  rosuvastatin (CRESTOR) 40 MG tablet Take 1 tablet (40 mg total) by mouth daily. 12/11/20 03/11/21  Tobb, Kardie, DO  sennosides-docusate sodium (SENOKOT-S) 8.6-50 MG tablet Take 2 tablets by mouth daily. 11/23/18   [name], MD  sertraline (ZOLOFT) 100 MG tablet Take 1 tablet (100 mg total) by mouth daily. Patient taking differently: Take 100 mg by mouth at bedtime.  09/06/20   [name], DO  ticagrelor (BRILINTA) 90 MG TABS tablet Take 1 tablet (90 mg total) by mouth 2 (two) times daily. 11/20/20   Cheryln Manly, NP  tiZANidine (ZANAFLEX) 4 MG tablet TAKE 1 TABLET (4 MG TOTAL) BY MOUTH EVERY 6 (SIX) HOURS AS NEEDED FOR MUSCLE SPASMS. 05/11/19   [name], DO  traZODone (DESYREL) 50 MG tablet Take 0.5-1 tablets (25-50 mg total) by mouth at bedtime as needed for sleep. 12/10/20   [name], Alferd Apa, DO    Allergies    Niacin  Review of Systems   Review of Systems  Constitutional: Positive for diaphoresis and fatigue. Negative for chills and fever.  HENT: Negative for congestion and facial swelling.   Eyes: Negative for discharge and visual disturbance.  Respiratory: Positive for shortness of breath.   Cardiovascular: Positive for chest pain. Negative for palpitations.  Gastrointestinal: Negative for abdominal pain, diarrhea and vomiting.  Musculoskeletal: Negative for arthralgias and myalgias.  Skin: Negative for color change and rash.  Neurological: Negative for tremors,  syncope and headaches.  Psychiatric/Behavioral: Negative for confusion and dysphoric mood.    Physical Exam Updated Vital Signs BP 120/72  Pulse 69   Temp 98.2 F (36.8 C) (Oral)   Resp 18   Ht 6' (1.829 m)   Wt 93 kg   SpO2 98%   BMI 27.80 kg/m   Physical Exam Vitals and nursing note reviewed.  Constitutional:      Appearance: He is well-developed and well-nourished.  HENT:     Head: Normocephalic and atraumatic.  Eyes:     Extraocular Movements: EOM normal.     Pupils: Pupils are equal, round, and reactive to light.  Neck:     Vascular: No JVD.  Cardiovascular:     Rate and Rhythm: Normal rate and regular rhythm.     Heart sounds: No murmur heard. No friction rub. No gallop.   Pulmonary:     Effort: No respiratory distress.     Breath sounds: No wheezing.  Chest:     Chest wall: No tenderness.  Abdominal:     General: There is no distension.     Tenderness: There is no guarding or rebound.  Musculoskeletal:        General: Normal range of motion.     Cervical back: Normal range of motion and neck supple.  Skin:    Coloration: Skin is not pale.     Findings: No rash.  Neurological:     Mental Status: He is alert and oriented to person, place, and time.  Psychiatric:        Mood and Affect: Mood and affect normal.        Behavior: Behavior normal.     ED Results / Procedures / Treatments   Labs (all labs ordered are listed, but only abnormal results are displayed) Labs Reviewed  BASIC METABOLIC PANEL - Abnormal; Notable for the following components:      Result Value   Sodium 134 (*)    Glucose, Bld 120 (*)    BUN 30 (*)    All other components within normal limits  RESP PANEL BY RT-PCR (FLU A&B, COVID) ARPGX2  CBC  TROPONIN I (HIGH SENSITIVITY)  TROPONIN I (HIGH SENSITIVITY)    EKG EKG Interpretation  Date/Time:  Sunday December 23 2020 21:31:10 EST Ventricular Rate:  68 PR Interval:  174 QRS Duration: 114 QT Interval:  414 QTC  Calculation: 440 R Axis:   -45 Text Interpretation: Sinus rhythm with occasional Premature ventricular complexes Pulmonary disease pattern Left anterior fascicular block Abnormal ECG No significant change since last tracing Confirmed by Deno Etienne [id number]) on 12/23/2020 10:20:57 PM   Radiology DG Chest Port 1 View  Result Date: 12/23/2020 CLINICAL DATA:  Chest pain EXAM: PORTABLE CHEST 1 VIEW COMPARISON:  Coronary CT 11/13/2020, chest radiograph 10/03/2008 FINDINGS: Some chronically coarsened interstitial and bronchitic features with hyperinflation are similar to prior. No new superimposed consolidative opacity. No pneumothorax or effusion. Slight pulmonary vascular congestion and cardiomegaly, the latter of which may be accentuated by abundant mediastinal fat seen on the comparison CT. The aorta is calcified. The remaining cardiomediastinal contours are unremarkable. No acute osseous or soft tissue abnormality. Degenerative changes are present in the imaged spine and shoulders. Telemetry leads overlie the chest. IMPRESSION: 1. Slight pulmonary vascular congestion and cardiomegaly, the latter of which may be accentuated by abundant mediastinal fat seen on the comparison CT. No convincing features of edema or consolidation. 2. More chronic interstitial and bronchitic features with hyperinflation. Electronically Signed   By: Lovena Le M.D.   On: 12/23/2020 22:20    Procedures Procedures (including critical care time)  Medications Ordered in ED Medications  nitroGLYCERIN (NITROSTAT) SL tablet 0.4 mg (has no administration in time range)  morphine 2 MG/ML injection 2 mg (2 mg Intravenous Given 12/23/20 2214)  aspirin chewable tablet 162 mg (162 mg Oral Given 12/23/20 2213)  metoprolol tartrate (LOPRESSOR) tablet 25 mg (25 mg Oral Given 12/23/20 2334)    ED Course  I have reviewed the triage vital signs and the nursing notes.  Pertinent labs & imaging results that were available during my care  of the patient were reviewed by me and considered in my medical decision making (see chart for details).    MDM Rules/Calculators/A&P                          64 yo M with a significant past medical history of coronary artery disease and has had 3 stents placed in the past 2 in the past 6 weeks comes in with a chief complaint of chest pain.  This been ongoing since this morning.  Woke him up from sleep.  Associated with diaphoresis nausea and radiation to both of his shoulders.  The patient was bit reluctant to come to the hospital because he did not want to be admitted.  After some discussion with his family he did relatively come. Will obtain a laboratory evaluation troponins discussed with cardiology.  I discussed the case with the cardiology fellow on call.  He recommended obtaining a second troponin.  If the patient had recurrent pain then we thought he might need to come back into the hospital.  Patient care was signed out to Dr. Jacqulyn Bath.  Please see his note for further details care in the ED.  I did go reassess the patient after this, he is having some recurrence of his pain.  We will give another dose of morphine.  Repeat EKG viewed by me without any ischemic change.   The patients results and plan were reviewed and discussed.   Any x-rays performed were independently reviewed by myself.   Differential diagnosis were considered with the presenting HPI.  Medications  nitroGLYCERIN (NITROSTAT) SL tablet 0.4 mg (has no administration in time range)  morphine 2 MG/ML injection 2 mg (2 mg Intravenous Given 12/23/20 2214)  aspirin chewable tablet 162 mg (162 mg Oral Given 12/23/20 2213)  metoprolol tartrate (LOPRESSOR) tablet 25 mg (25 mg Oral Given 12/23/20 2334)    Vitals:   12/23/20 2139 12/23/20 2141 12/23/20 2200 12/23/20 2230  BP:  128/82 127/71 120/72  Pulse:  66 68 69  Resp:  16 17 18   Temp:  98.2 F (36.8 C)    TempSrc:  Oral    SpO2:  100% 100% 98%  Weight: 93 kg      Height: 6' (1.829 m)       Final diagnoses:  Chest pain with high risk for cardiac etiology    Admission/ observation were discussed with the admitting physician, patient and/or family and they are comfortable with the plan.    Final Clinical Impression(s) / ED Diagnoses Final diagnoses:  Chest pain with high risk for cardiac etiology    Rx / DC Orders ED Discharge Orders    None       08-02-1986, DO 12/23/20 2341

## 2020-12-24 ENCOUNTER — Encounter (HOSPITAL_COMMUNITY)
Admission: EM | Disposition: A | Discharge status: Home / Self Care | Payer: Self-pay | Source: Home / Self Care | Attending: Emergency Medicine

## 2020-12-24 ENCOUNTER — Encounter (HOSPITAL_COMMUNITY): Payer: Self-pay | Admitting: Internal Medicine

## 2020-12-24 DIAGNOSIS — Z955 Presence of coronary angioplasty implant and graft: Secondary | ICD-10-CM | POA: Diagnosis not present

## 2020-12-24 DIAGNOSIS — I200 Unstable angina: Secondary | ICD-10-CM

## 2020-12-24 DIAGNOSIS — I25110 Atherosclerotic heart disease of native coronary artery with unstable angina pectoris: Secondary | ICD-10-CM | POA: Diagnosis not present

## 2020-12-24 DIAGNOSIS — I2510 Atherosclerotic heart disease of native coronary artery without angina pectoris: Secondary | ICD-10-CM | POA: Diagnosis not present

## 2020-12-24 DIAGNOSIS — Z79899 Other long term (current) drug therapy: Secondary | ICD-10-CM | POA: Diagnosis not present

## 2020-12-24 DIAGNOSIS — Z7984 Long term (current) use of oral hypoglycemic drugs: Secondary | ICD-10-CM | POA: Diagnosis not present

## 2020-12-24 DIAGNOSIS — R079 Chest pain, unspecified: Secondary | ICD-10-CM | POA: Diagnosis not present

## 2020-12-24 DIAGNOSIS — E119 Type 2 diabetes mellitus without complications: Secondary | ICD-10-CM | POA: Diagnosis not present

## 2020-12-24 DIAGNOSIS — I10 Essential (primary) hypertension: Secondary | ICD-10-CM | POA: Diagnosis not present

## 2020-12-24 DIAGNOSIS — Z20822 Contact with and (suspected) exposure to covid-19: Secondary | ICD-10-CM | POA: Diagnosis not present

## 2020-12-24 DIAGNOSIS — Z7982 Long term (current) use of aspirin: Secondary | ICD-10-CM | POA: Diagnosis not present

## 2020-12-24 HISTORY — PX: LEFT HEART CATH AND CORONARY ANGIOGRAPHY: CATH118249

## 2020-12-24 LAB — TROPONIN I (HIGH SENSITIVITY)
Troponin I (High Sensitivity): 5 ng/L (ref <18)
Troponin I (High Sensitivity): 6 ng/L (ref <18)

## 2020-12-24 LAB — BASIC METABOLIC PANEL
Anion gap: 10 (ref 5–15)
BUN: 24 mg/dL — ABNORMAL HIGH (ref 8–23)
CO2: 24 mmol/L (ref 22–32)
Calcium: 9.7 mg/dL (ref 8.9–10.3)
Chloride: 102 mmol/L (ref 98–111)
Creatinine, Ser: 0.83 mg/dL (ref 0.61–1.24)
GFR, Estimated: >60 mL/min (ref >60)
Glucose, Bld: 136 mg/dL — ABNORMAL HIGH (ref 70–99)
Potassium: 3.6 mmol/L (ref 3.5–5.1)
Sodium: 136 mmol/L (ref 135–145)

## 2020-12-24 LAB — GLUCOSE, CAPILLARY
Glucose-Capillary: 120 mg/dL — ABNORMAL HIGH (ref 70–99)
Glucose-Capillary: 142 mg/dL — ABNORMAL HIGH (ref 70–99)

## 2020-12-24 LAB — MRSA PCR SCREENING: MRSA by PCR: NEGATIVE

## 2020-12-24 LAB — HIV ANTIBODY (ROUTINE TESTING W REFLEX): HIV Screen 4th Generation wRfx: NONREACTIVE

## 2020-12-24 SURGERY — LEFT HEART CATH AND CORONARY ANGIOGRAPHY
Anesthesia: LOCAL

## 2020-12-24 MED ORDER — RANOLAZINE ER 500 MG PO TB12: 1000.0000 mg | ORAL_TABLET | Freq: Two times a day (BID) | ORAL | Status: DC

## 2020-12-24 MED ORDER — HEPARIN (PORCINE) 25000 UT/250ML-% IV SOLN
1250.0000 [IU]/h | INTRAVENOUS | Status: DC
  Administered 2020-12-24: 05:00:00 1250 [IU]/h via INTRAVENOUS
  Filled 2020-12-24: qty 250

## 2020-12-24 MED ORDER — SERTRALINE HCL 100 MG PO TABS: 100.0000 mg | ORAL_TABLET | Freq: Every day | ORAL | Status: DC

## 2020-12-24 MED ORDER — SODIUM CHLORIDE 0.9 % WEIGHT BASED INFUSION: 1.0000 mL/kg/h | INTRAVENOUS | Status: AC

## 2020-12-24 MED ORDER — MIDAZOLAM HCL 2 MG/2ML IJ SOLN
INTRAMUSCULAR | Status: AC
  Filled 2020-12-24: qty 2

## 2020-12-24 MED ORDER — SODIUM CHLORIDE 0.9% FLUSH: 3.0000 mL | Freq: Two times a day (BID) | INTRAVENOUS | Status: DC

## 2020-12-24 MED ORDER — ASPIRIN EC 81 MG PO TBEC: 81.0000 mg | DELAYED_RELEASE_TABLET | Freq: Every day | ORAL | Status: DC

## 2020-12-24 MED ORDER — SODIUM CHLORIDE 0.9 % WEIGHT BASED INFUSION
3.0000 mL/kg/h | INTRAVENOUS | Status: DC
  Administered 2020-12-24: 09:00:00 3 mL/kg/h via INTRAVENOUS

## 2020-12-24 MED ORDER — FENOFIBRATE 160 MG PO TABS: 160.0000 mg | ORAL_TABLET | Freq: Every day | ORAL | Status: DC

## 2020-12-24 MED ORDER — VERAPAMIL HCL 2.5 MG/ML IV SOLN
INTRAVENOUS | Status: AC
  Filled 2020-12-24: qty 2

## 2020-12-24 MED ORDER — ONDANSETRON HCL 4 MG/2ML IJ SOLN: 4.0000 mg | Freq: Four times a day (QID) | INTRAMUSCULAR | Status: DC | PRN

## 2020-12-24 MED ORDER — ACETAMINOPHEN 325 MG PO TABS: 650.0000 mg | ORAL_TABLET | ORAL | Status: DC | PRN

## 2020-12-24 MED ORDER — LIDOCAINE HCL (PF) 1 % IJ SOLN
INTRAMUSCULAR | Status: AC
  Filled 2020-12-24: qty 30

## 2020-12-24 MED ORDER — LISINOPRIL 5 MG PO TABS: 5.0000 mg | ORAL_TABLET | Freq: Every day | ORAL | Status: DC

## 2020-12-24 MED ORDER — SODIUM CHLORIDE 0.9% FLUSH
3.0000 mL | INTRAVENOUS | Status: DC | PRN
Start: 2020-12-24 — End: 2020-12-24

## 2020-12-24 MED ORDER — VERAPAMIL HCL 2.5 MG/ML IV SOLN
INTRAVENOUS | Status: DC | PRN
  Administered 2020-12-24: 10:00:00 10 mL via INTRA_ARTERIAL

## 2020-12-24 MED ORDER — PANTOPRAZOLE SODIUM 40 MG PO TBEC: 40.0000 mg | DELAYED_RELEASE_TABLET | Freq: Every day | ORAL | Status: DC

## 2020-12-24 MED ORDER — ASPIRIN 81 MG PO CHEW: 81.0000 mg | CHEWABLE_TABLET | ORAL | Status: DC

## 2020-12-24 MED ORDER — INSULIN ASPART 100 UNIT/ML ~~LOC~~ SOLN: 0.0000 [IU] | Freq: Three times a day (TID) | SUBCUTANEOUS | Status: DC

## 2020-12-24 MED ORDER — MIDAZOLAM HCL 2 MG/2ML IJ SOLN
INTRAMUSCULAR | Status: DC | PRN
  Administered 2020-12-24: 1 mg via INTRAVENOUS

## 2020-12-24 MED ORDER — FENTANYL CITRATE (PF) 100 MCG/2ML IJ SOLN
INTRAMUSCULAR | Status: AC
  Filled 2020-12-24: qty 2

## 2020-12-24 MED ORDER — GABAPENTIN 400 MG PO CAPS: 800.0000 mg | ORAL_CAPSULE | Freq: Three times a day (TID) | ORAL | Status: DC

## 2020-12-24 MED ORDER — LIDOCAINE HCL (PF) 1 % IJ SOLN
INTRAMUSCULAR | Status: DC | PRN
  Administered 2020-12-24: 2 mL

## 2020-12-24 MED ORDER — SODIUM CHLORIDE 0.9 % IV SOLN: 250.0000 mL | INTRAVENOUS | Status: DC | PRN

## 2020-12-24 MED ORDER — SODIUM CHLORIDE 0.9 % IV SOLN
250.0000 mL | INTRAVENOUS | Status: DC | PRN
Start: 2020-12-24 — End: 2020-12-24

## 2020-12-24 MED ORDER — HEPARIN BOLUS VIA INFUSION
4000.0000 [IU] | Freq: Once | INTRAVENOUS | Status: AC
  Administered 2020-12-24: 05:00:00 4000 [IU] via INTRAVENOUS
  Filled 2020-12-24: qty 4000

## 2020-12-24 MED ORDER — FENTANYL CITRATE (PF) 100 MCG/2ML IJ SOLN
INTRAMUSCULAR | Status: DC | PRN
  Administered 2020-12-24: 25 ug via INTRAVENOUS

## 2020-12-24 MED ORDER — ROSUVASTATIN CALCIUM 20 MG PO TABS: 40.0000 mg | ORAL_TABLET | Freq: Every day | ORAL | Status: DC

## 2020-12-24 MED ORDER — TICAGRELOR 90 MG PO TABS: 90.0000 mg | ORAL_TABLET | Freq: Two times a day (BID) | ORAL | Status: DC

## 2020-12-24 MED ORDER — HEPARIN (PORCINE) IN NACL 1000-0.9 UT/500ML-% IV SOLN
INTRAVENOUS | Status: AC
  Filled 2020-12-24: qty 1000

## 2020-12-24 MED ORDER — HEPARIN SODIUM (PORCINE) 1000 UNIT/ML IJ SOLN
INTRAMUSCULAR | Status: AC
  Filled 2020-12-24: qty 1

## 2020-12-24 MED ORDER — TRAZODONE HCL 50 MG PO TABS: 25.0000 mg | ORAL_TABLET | Freq: Every evening | ORAL | Status: DC | PRN

## 2020-12-24 MED ORDER — HEPARIN (PORCINE) IN NACL 1000-0.9 UT/500ML-% IV SOLN
INTRAVENOUS | Status: DC | PRN
  Administered 2020-12-24 (×2): 500 mL

## 2020-12-24 MED ORDER — SODIUM CHLORIDE 0.9 % WEIGHT BASED INFUSION: 1.0000 mL/kg/h | INTRAVENOUS | Status: DC

## 2020-12-24 MED ORDER — SODIUM CHLORIDE 0.9% FLUSH: 3.0000 mL | INTRAVENOUS | Status: DC | PRN

## 2020-12-24 MED ORDER — IOHEXOL 350 MG/ML SOLN
INTRAVENOUS | Status: DC | PRN
  Administered 2020-12-24: 10:00:00 50 mL via INTRA_ARTERIAL

## 2020-12-24 MED ORDER — HEPARIN SODIUM (PORCINE) 1000 UNIT/ML IJ SOLN
INTRAMUSCULAR | Status: DC | PRN
  Administered 2020-12-24: 5000 [IU] via INTRAVENOUS

## 2020-12-24 MED ORDER — TIZANIDINE HCL 4 MG PO TABS: 4.0000 mg | ORAL_TABLET | Freq: Four times a day (QID) | ORAL | Status: DC | PRN

## 2020-12-24 SURGICAL SUPPLY — 9 items

## 2020-12-24 NOTE — Progress Notes (Signed)
ANTICOAGULATION CONSULT NOTE - Initial Consult  Pharmacy Consult for heparin Indication: chest pain/ACS  Allergies  Allergen Reactions  . Niacin Anaphylaxis    Patient Measurements: Height: 6' (182.9 cm) Weight: 96.6 kg (212 lb 15.4 oz) IBW/kg (Calculated) : 77.6 Heparin Dosing Weight: 96 kg  Vital Signs: Temp: 98.4 F (36.9 C) (12/27 0350) Temp Source: Oral (12/27 0350) BP: 121/83 (12/27 0400) Pulse Rate: 53 (12/27 0400)  Labs: Recent Labs    12/23/20 2137 12/23/20 2340  HGB 14.5  --   HCT 41.3  --   PLT 221  --   CREATININE 0.91  --   TROPONINIHS 6 6    Estimated Creatinine Clearance: 97.5 mL/min (by C-G formula based on SCr of 0.91 mg/dL).   Medical History: Past Medical History:  Diagnosis Date  . Anxiety   . Arthritis   . Cancer (HCC)   . Complication of anesthesia    aspirated with back surgery at age 66  . Depression   . Diabetes mellitus   . GERD (gastroesophageal reflux disease)   . Hyperlipidemia   . Hypertension   . Neuromuscular disorder (HCC)    NEUROPATHY  . Right rotator cuff tear 11/23/2018  . Sleep apnea    no CPAP    Medications:  Medications Prior to Admission  Medication Sig Dispense Refill Last Dose  . aspirin EC 81 MG tablet Take 1 tablet (81 mg total) by mouth daily. Swallow whole. 90 tablet 3   . fenofibrate 160 MG tablet Take 1 tablet (160 mg total) by mouth daily. (Patient taking differently: Take 160 mg by mouth daily at 12 noon. ) 90 tablet 1   . gabapentin (NEURONTIN) 800 MG tablet Take 1 tablet (800 mg total) by mouth 3 (three) times daily. 270 tablet 3   . glimepiride ([name]) 4 MG tablet Take 1 tablet (4 mg total) by mouth daily before breakfast. 90 tablet 3   . glucose blood test strip Use as instructed 100 each 12   . lisinopril (ZESTRIL) 5 MG tablet Take 1 tablet (5 mg total) by mouth daily. 90 tablet 1   . metFORMIN ([name]) 500 MG 24 hr tablet Take 2 tablets (1,000 mg total) by mouth daily with breakfast  AND 1 tablet (500 mg total) daily with supper. 270 tablet 3   . nitroGLYCERIN (NITROSTAT) 0.4 MG SL tablet Place 1 tablet (0.4 mg total) under the tongue every 5 (five) minutes as needed for chest pain. 90 tablet 3   . omeprazole (PRILOSEC) 40 MG capsule 1 po bid (Patient taking differently: Take 80 mg by mouth at bedtime. ) 180 capsule 3   . ondansetron (ZOFRAN) 4 MG tablet Take 1 tablet (4 mg total) by mouth every 8 (eight) hours as needed. (Patient taking differently: Take 4 mg by mouth every 8 (eight) hours as needed for nausea or vomiting. ) 20 tablet 2   . oxyCODONE-acetaminophen (PERCOCET/ROXICET) 5-325 MG tablet TAKE 1 TABLET BY MOUTH EVERY 6 HOURS AS NEEDED 90 tablet 0   . ranolazine (RANEXA) 1000 MG SR tablet Take 1 tablet (1,000 mg total) by mouth 2 (two) times daily. 60 tablet 3   . rosuvastatin (CRESTOR) 40 MG tablet Take 1 tablet (40 mg total) by mouth daily. 90 tablet 3   . sennosides-docusate sodium (SENOKOT-S) 8.6-50 MG tablet Take 2 tablets by mouth daily. 30 tablet 1   . sertraline (ZOLOFT) 100 MG tablet Take 1 tablet (100 mg total) by mouth daily. (Patient taking differently: Take  100 mg by mouth at bedtime. ) 90 tablet 1   . ticagrelor (BRILINTA) 90 MG TABS tablet Take 1 tablet (90 mg total) by mouth 2 (two) times daily. 180 tablet 1   . tiZANidine (ZANAFLEX) 4 MG tablet TAKE 1 TABLET (4 MG TOTAL) BY MOUTH EVERY 6 (SIX) HOURS AS NEEDED FOR MUSCLE SPASMS. 30 tablet 1   . traZODone (DESYREL) 50 MG tablet Take 0.5-1 tablets (25-50 mg total) by mouth at bedtime as needed for sleep. 30 tablet 3     Assessment: 64 yo man to start heparin for CP.  He was not on anticoagulation PTA.  Hg 14.4, PTLC 221 Goal of Therapy:  Heparin level 0.3-0.7 units/ml Monitor platelets by anticoagulation protocol: Yes   Plan:  Heparin 4000 unit bolus and drip at 1250 units/hr Check heparin level in 6-8 hours Daily HL and CBC while on heparin Monitor for bleeding complications  Ronald Petersen  [place] 12/24/2020,4:08 AM

## 2020-12-24 NOTE — Progress Notes (Signed)
Progress Note  Patient Name: Ronald Petersen Date of Encounter: 12/24/2020  [facility] Cardiologist: Thomasene Ripple, DO   Subjective   Persistent CP; no dyspnea  Inpatient Medications    Scheduled Meds: . aspirin EC  81 mg Oral Daily  . fenofibrate  160 mg Oral Daily  . gabapentin  800 mg Oral TID  . insulin aspart  0-15 Units Subcutaneous TID WC  . lisinopril  5 mg Oral Daily  . pantoprazole  40 mg Oral Daily  . ranolazine  1,000 mg Oral BID  . rosuvastatin  40 mg Oral Daily  . sertraline  100 mg Oral QHS  . ticagrelor  90 mg Oral BID   Continuous Infusions: . heparin 1,250 Units/hr (12/24/20 0432)   PRN Meds: acetaminophen, morphine injection, nitroGLYCERIN, ondansetron (ZOFRAN) IV, tiZANidine, traZODone   Vital Signs    Vitals:   12/24/20 0230 12/24/20 0250 12/24/20 0350 12/24/20 0400  BP: 117/62 106/78 123/62 121/83  Pulse: (!) 57 (!) 56 (!) 51 (!) 53  Resp: 15 17 20 16   Temp:  98.3 F (36.8 C) 98.4 F (36.9 C)   TempSrc:   Oral   SpO2: 98% 100% 98% 97%  Weight:   96.6 kg   Height:   6' (1.829 m)    No intake or output data in the 24 hours ending 12/24/20 0721 Last 3 Weights 12/24/2020 12/23/2020 11/26/2020  Weight (lbs) 212 lb 15.4 oz 205 lb 215 lb 1.6 oz  Weight (kg) 96.6 kg 92.987 kg 97.569 kg      Telemetry    Sinus with rare PVC- Personally Reviewed  ECG    Sinus bradycardia with no ST changes.- Personally Reviewed  Physical Exam   GEN: No acute distress.   Neck: No JVD Cardiac: RRR, no murmurs, rubs, or gallops.  Respiratory: Clear to auscultation bilaterally. GI: Soft, nontender, non-distended  MS: No edema Neuro:  Nonfocal  Psych: Normal affect   Labs    High Sensitivity Troponin:   Recent Labs  Lab 12/23/20 2137 12/23/20 2340  TROPONINIHS 6 6      Chemistry Recent Labs  Lab 12/23/20 2137  NA 134*  K 3.8  CL 100  CO2 24  GLUCOSE 120*  BUN 30*  CREATININE 0.91  CALCIUM 9.6  GFRNONAA >60  ANIONGAP 10      Hematology Recent Labs  Lab 12/23/20 2137  WBC 9.9  RBC 4.97  HGB 14.5  HCT 41.3  MCV 83.1  MCH 29.2  MCHC 35.1  RDW 12.4  PLT 221    Radiology    DG Chest Port 1 View  Result Date: 12/23/2020 CLINICAL DATA:  Chest pain EXAM: PORTABLE CHEST 1 VIEW COMPARISON:  Coronary CT 11/13/2020, chest radiograph 10/03/2008 FINDINGS: Some chronically coarsened interstitial and bronchitic features with hyperinflation are similar to prior. No new superimposed consolidative opacity. No pneumothorax or effusion. Slight pulmonary vascular congestion and cardiomegaly, the latter of which may be accentuated by abundant mediastinal fat seen on the comparison CT. The aorta is calcified. The remaining cardiomediastinal contours are unremarkable. No acute osseous or soft tissue abnormality. Degenerative changes are present in the imaged spine and shoulders. Telemetry leads overlie the chest. IMPRESSION: 1. Slight pulmonary vascular congestion and cardiomegaly, the latter of which may be accentuated by abundant mediastinal fat seen on the comparison CT. No convincing features of edema or consolidation. 2. More chronic interstitial and bronchitic features with hyperinflation. Electronically Signed   By: 12/03/2008 M.D.   On:  12/23/2020 22:20    Patient Profile     64 y.o. male with past medical history of coronary artery disease (status post drug-eluting stent to proximal LAD, left circumflex/OM1 and PTCA of diagonal November 2021) diabetes mellitus, hypertension, hyperlipidemia, chronic neck/back pain admitted with recurrent chest pain.  Assessment & Plan    1 chest pain-symptoms are atypical.  They have been persistent for 24 hours though worse at times with some improvement with nitroglycerin.  Some worsening with moving left upper extremity.  Question musculoskeletal.  Enzymes are negative.  Electrocardiogram shows no ST changes.  Patient is very concerned about his symptoms.  He has had intermittent  chest pain since his previous PCI in November.  I feel that definitive evaluation is warranted.  We will arrange cardiac catheterization.  The risks and benefits including myocardial infarction, CVA and death discussed and he agrees to proceed.  2 coronary artery disease-continue aspirin, Brilinta and Crestor.  We will continue ranolazine for intermittent chest pain.  3 hypertension-patient's blood pressure is controlled.  Continue present medications and follow.  4 hyperlipidemia-continue Crestor and fenofibrate.  5 diabetes mellitus-hold Metformin for 48 hours following catheterization.  For questions or updates, please contact [place] Please consult [email or web address] for contact info under        Signed, Kirk Ruths, MD  12/24/2020, 7:21 AM

## 2020-12-24 NOTE — Interval H&P Note (Signed)
History and Physical Interval Note:  12/24/2020 9:43 AM  Ronald Petersen  has presented today for surgery, with the diagnosis of st elevation.  The various methods of treatment have been discussed with the patient and family. After consideration of risks, benefits and other options for treatment, the patient has consented to  Procedure(s): LEFT HEART CATH AND CORONARY ANGIOGRAPHY (N/A) as a surgical intervention.  The patient's history has been reviewed, patient examined, no change in status, stable for surgery.  I have reviewed the patient's chart and labs.  Questions were answered to the patient's satisfaction.    Cath Lab Visit (complete for each Cath Lab visit)  Clinical Evaluation Leading to the Procedure:   ACS: Yes.    Non-ACS:    Anginal Classification: CCS III  Anti-ischemic medical therapy: Minimal Therapy (1 class of medications)  Non-Invasive Test Results: No non-invasive testing performed  Prior CABG: No previous CABG       Ronald Petersen [facility] 12/24/2020 9:43 AM

## 2020-12-24 NOTE — Discharge Summary (Signed)
Discharge Summary    Patient ID: Ronald Petersen MRN: [id number]; DOB: [birth year]  Admit date: 12/23/2020 Discharge date: 12/24/2020  Primary Care Provider: [name], DO  Primary Cardiologist: Berniece Salines, DO  Primary Electrophysiologist:  None   Discharge Diagnoses    Principal Problem:   Unstable angina [facility]) Active Problems:   DM (diabetes mellitus) type II uncontrolled, periph vascular disorder ([place])   Hyperlipidemia LDL goal <70   Essential hypertension   CAD S/P DES PCI LAD & LCx-OM    Diagnostic Studies/Procedures    Left heart catheterization 12/24/20:  1st Diag lesion is 25% stenosed.  Previously placed Prox LAD to Mid LAD drug eluting stent is widely patent.  Previously placed 1st Mrg drug eluting stent is widely patent.  Previously placed Prox Cx drug-eluting stents are widely patent.  RPAV lesion is 50% stenosed.  LV end diastolic pressure is normal.   1. Nonobstructive CAD. Excellent patency of stents in the LAD and LCx. Lesion in the PL branch reduced from 70% to 50%. 2. Normal LV filling pressures.  Plan: continue medical management. Anticipate DC today.   _____________   History of Present Illness     Ronald Petersen is a 64 y.o. male with a PMH of CAD s/p PCI/DES to LAD, OM1, and LCx 10/2020, HTN, HLD, DM type 2, and chronic back pain who presented to the ED with chest pain.   Hospital Course     Consultants: None  1. Unstable angina in patient with CAD s/p PCI/DES to LAD, OM1, and LCx 10/2020: Patient presented with chest pain persistent for 24 hours prior to admission with some improvement with SL nitro. There was some question of MSK component given worsening symptoms with moving LUE. HsTrops negative. EKG non-ischemic. Patient was quite concerned with his symptoms and decision made to pursue a definitive evaluation. Patient underwent LHC 12/24/20 which showed patent p-mLAD, OM1, and pLCx stents with medically managed 25% D1  and 50% RPAV lesion (PL branch stenosis reduced from 70% on prior cath). He was noted to have normal LV filling pressures. He was recommended to continue aspirin and brilinta for a minimum of 12 months.  - Continue aspirin and brilinta - Continue statin and fenofibrate - Continue ranexa - Continue metoprolol tartrate  2. HTN: BP stable this admission - Continue metoprolol tartrate and lisinopril  3. HLD: LDL 96 and triglycerides 174 08/2020; goal LDL <70 - Continue crestor and fenofibrate - Consider repeat FLP at follow-up with low threshold for referral to lipid clinic if LDL remains above goal  4. DM type 2: A1C 7.2; goal <7 - Continue glimepiride and metformin (patient instructed to restart metformin 12/27/20) per primary team    Did the patient have an acute coronary syndrome (MI, NSTEMI, STEMI, etc) this admission?:  No                               Did the patient have a percutaneous coronary intervention (stent / angioplasty)?:  No.       _____________  Discharge Vitals Blood pressure (!) 116/57, pulse (!) 56, temperature 98.4 F (36.9 C), temperature source Oral, resp. rate (!) 4, height 6' (1.829 m), weight 96.6 kg, SpO2 97 %.  Filed Weights   12/23/20 2139 12/24/20 0350  Weight: 93 kg 96.6 kg    Labs & Radiologic Studies    CBC Recent Labs    12/23/20 2137  WBC 9.9  HGB 14.5  HCT 41.3  MCV 83.1  PLT 109   Basic Metabolic Panel Recent Labs    12/23/20 2137 12/24/20 0648  NA 134* 136  K 3.8 3.6  CL 100 102  CO2 24 24  GLUCOSE 120* 136*  BUN 30* 24*  CREATININE 0.91 0.83  CALCIUM 9.6 9.7   Liver Function Tests No results for input(s): AST, ALT, ALKPHOS, BILITOT, PROT, ALBUMIN in the last 72 hours. No results for input(s): LIPASE, AMYLASE in the last 72 hours. High Sensitivity Troponin:   Recent Labs  Lab 12/23/20 2137 12/23/20 2340 12/24/20 0648  TROPONINIHS 6 6 5     BNP Invalid input(s): POCBNP D-Dimer No results for input(s): DDIMER in  the last 72 hours. Hemoglobin A1C No results for input(s): HGBA1C in the last 72 hours. Fasting Lipid Panel No results for input(s): CHOL, HDL, LDLCALC, TRIG, CHOLHDL, LDLDIRECT in the last 72 hours. Thyroid Function Tests No results for input(s): TSH, T4TOTAL, T3FREE, THYROIDAB in the last 72 hours.  Invalid input(s): FREET3 _____________  CARDIAC CATHETERIZATION  Result Date: 12/24/2020  1st Diag lesion is 25% stenosed.  Previously placed Prox LAD to Mid LAD drug eluting stent is widely patent.  Previously placed 1st Mrg drug eluting stent is widely patent.  Previously placed Prox Cx drug-eluting stents are widely patent.  RPAV lesion is 50% stenosed.  LV end diastolic pressure is normal.  1. Nonobstructive CAD. Excellent patency of stents in the LAD and LCx. Lesion in the PL branch reduced from 70% to 50%. 2. Normal LV filling pressures. Plan: continue medical management. Anticipate DC today.   DG Chest Port 1 View  Result Date: 12/23/2020 CLINICAL DATA:  Chest pain EXAM: PORTABLE CHEST 1 VIEW COMPARISON:  Coronary CT 11/13/2020, chest radiograph 10/03/2008 FINDINGS: Some chronically coarsened interstitial and bronchitic features with hyperinflation are similar to prior. No new superimposed consolidative opacity. No pneumothorax or effusion. Slight pulmonary vascular congestion and cardiomegaly, the latter of which may be accentuated by abundant mediastinal fat seen on the comparison CT. The aorta is calcified. The remaining cardiomediastinal contours are unremarkable. No acute osseous or soft tissue abnormality. Degenerative changes are present in the imaged spine and shoulders. Telemetry leads overlie the chest. IMPRESSION: 1. Slight pulmonary vascular congestion and cardiomegaly, the latter of which may be accentuated by abundant mediastinal fat seen on the comparison CT. No convincing features of edema or consolidation. 2. More chronic interstitial and bronchitic features with  hyperinflation. Electronically Signed   By: Lovena Le M.D.   On: 12/23/2020 22:20   Disposition   Pt is being discharged home today in good condition.  Follow-up Plans & Appointments     Follow-up Information    Tobb, Godfrey Pick, DO Follow up on 01/17/2021.   Specialty: Cardiology Why: Please arrive 15 minutes early for your 11:20pm post-hospital cardiology appointment Contact information: [address] [place] [address] [phone]                Discharge Medications   Allergies as of 12/24/2020      Reactions   Niacin Anaphylaxis      Medication List    TAKE these medications   aspirin EC 81 MG tablet Take 1 tablet (81 mg total) by mouth daily. Swallow whole.   fenofibrate 160 MG tablet Take 1 tablet (160 mg total) by mouth daily. What changed: when to take this   gabapentin 800 MG tablet Commonly known as: Neurontin Take 1 tablet (800 mg  total) by mouth 3 (three) times daily.   glimepiride 4 MG tablet Commonly known as: [name] Take 1 tablet (4 mg total) by mouth daily before breakfast.   glucose blood test strip Use as instructed   lisinopril 5 MG tablet Commonly known as: ZESTRIL Take 1 tablet (5 mg total) by mouth daily.   metFORMIN 500 MG 24 hr tablet Commonly known as: [name] Take 2 tablets (1,000 mg total) by mouth daily with breakfast AND 1 tablet (500 mg total) daily with supper. Notes to patient: Restart this medication 12/27/20 as previously prescribed   nitroGLYCERIN 0.4 MG SL tablet Commonly known as: NITROSTAT Place 1 tablet (0.4 mg total) under the tongue every 5 (five) minutes as needed for chest pain.   omeprazole 40 MG capsule Commonly known as: PRILOSEC 1 po bid What changed:   how much to take  how to take this  when to take this  additional instructions   ondansetron 4 MG tablet Commonly known as: ZOFRAN Take 1 tablet (4 mg total) by mouth every 8 (eight) hours as needed. What changed:  reasons to take this   oxyCODONE-acetaminophen 5-325 MG tablet Commonly known as: PERCOCET/ROXICET TAKE 1 TABLET BY MOUTH EVERY 6 HOURS AS NEEDED What changed: when to take this   ranolazine 1000 MG SR tablet Commonly known as: Ranexa Take 1 tablet (1,000 mg total) by mouth 2 (two) times daily.   rosuvastatin 40 MG tablet Commonly known as: CRESTOR Take 1 tablet (40 mg total) by mouth daily.   sennosides-docusate sodium 8.6-50 MG tablet Commonly known as: SENOKOT-S Take 2 tablets by mouth daily. What changed:   how much to take  when to take this  reasons to take this   sertraline 100 MG tablet Commonly known as: ZOLOFT Take 1 tablet (100 mg total) by mouth daily. What changed: when to take this   ticagrelor 90 MG Tabs tablet Commonly known as: BRILINTA Take 1 tablet (90 mg total) by mouth 2 (two) times daily.   tiZANidine 4 MG tablet Commonly known as: ZANAFLEX TAKE 1 TABLET (4 MG TOTAL) BY MOUTH EVERY 6 (SIX) HOURS AS NEEDED FOR MUSCLE SPASMS.   traZODone 50 MG tablet Commonly known as: DESYREL Take 0.5-1 tablets (25-50 mg total) by mouth at bedtime as needed for sleep.          Outstanding Labs/Studies   Consider repeat FLP at follow-up  Duration of Discharge Encounter   Greater than 30 minutes including physician time.  Signed, Beatriz Stallion, PA-C 12/24/2020, 11:10 AM

## 2020-12-24 NOTE — H&P (Addendum)
Cardiology Admission History and Physical:   Patient ID: Ronald Petersen MRN: [id number]; DOB: February 04, 1956   Admission date: 12/23/2020  Primary Care Provider: [name], Alferd Apa, DO [facility] Cardiologist: Berniece Salines, DO  [facility] Electrophysiologist:  None   Chief Complaint:  "I felt somethin different this morning"  Patient Profile:   Ronald Petersen is a 64 y.o. male with hx of CAD s/p DES to prox LAD + LCx/OM1 (10/2020), type 2 diabetes mellitus, essential hypertension, hyperlipidemia, depression, and chronic neck/back pain who presented to the ED yesterday evening with L sided chest pain.   History of Present Illness:   Ronald Petersen was referred to see Dr. Harriet Masson in October of this year for evaluation of chest pain and shortness of breath.  He was referred for CTA and this was completed in November.  This was notable for three-vessel coronary disease with positive FFR in all 3 vascular territories.  He was then referred for cardiac catheterization with Dr. Saunders Revel.  Here he was noted to have three-vessel coronary disease and had drug-eluting stents placed in his proximal LAD, proximal left circumflex, and first obtuse marginal.  The first diagonal branch was also ballooned.  There was residual disease in a PLV branch that was felt to be best managed medically.  He was discharged on aspirin and ticagrelor.  He saw Dr. Harriet Masson in late November and reported persistent chest pain.  He was started on Ranexa and Imdur was stopped because of headaches.  This evening, Ronald Petersen tells me he has had chest pain off and on since his cardiac catheterization.  Overall, however, he does feel like "a new man."  He feels as though he has much improved circulation and an increased energy level.  The chest pain is typically left-sided, dull, with no clear exacerbating or relieving factors.  It typically only lasts for seconds to minutes and self resolves.  Yesterday evening [name] had difficulty  falling asleep although was able to finally go to bed around 1 AM.  He reports waking up at 4 AM with left-sided chest pressure that was unlike anything else he had felt previously.  There were no other associated symptoms.  He has had GERD in the past but states this was nothing like that.  A nurse he knows suggested he try drinking ginger ale to see if this improves his discomfort but he did not notice any difference.  The chest pain persisted throughout the morning although he was able to go to church.  After returning home he decided to try nitroglycerin, and while the chest pain seemed to lessen a bit, it did not go away.  Given he had never had discomfort like this before and it was not responsive to nitroglycerin his family convinced him to come to the emergency department.  In the emergency department he was hemodynamically stable but continued to have chest discomfort.  He noted a mild improvement with morphine although the chest discomfort returned as soon as "the morphine wore off."  His troponins were negative with no ischemic EKG changes, but given his history and ongoing pain, he was admitted for further evaluation.   Past Medical History:  Diagnosis Date  . Anxiety   . Arthritis   . Cancer ([place])   . Complication of anesthesia    aspirated with back surgery at age 69  . Depression   . Diabetes mellitus   . GERD (gastroesophageal reflux disease)   . Hyperlipidemia   . Hypertension   .  Neuromuscular disorder ([place])    NEUROPATHY  . Right rotator cuff tear 11/23/2018  . Sleep apnea    no CPAP    Past Surgical History:  Procedure Laterality Date  . APPENDECTOMY    . ARTHOSCOPIC ROTAOR CUFF REPAIR Right 11/23/2018   Procedure: ARTHROSCOPIC ROTATOR CUFF REPAIR;  Surgeon: [name], MD;  Location: [place];  Service: Orthopedics;  Laterality: Right;  . CHOLECYSTECTOMY    . CORONARY STENT INTERVENTION N/A 11/19/2020   Procedure: CORONARY STENT INTERVENTION;   Surgeon: Nelva Bush, MD;  Location: [place] [facility];  Service: Cardiovascular;  Laterality: N/A;  . INTRAVASCULAR ULTRASOUND/IVUS N/A 11/19/2020   Procedure: Intravascular Ultrasound/IVUS;  Surgeon: Nelva Bush, MD;  Location: [place] [facility];  Service: Cardiovascular;  Laterality: N/A;  . LEFT HEART CATH AND CORONARY ANGIOGRAPHY N/A 11/19/2020   Procedure: LEFT HEART CATH AND CORONARY ANGIOGRAPHY;  Surgeon: Nelva Bush, MD;  Location: [place] [facility];  Service: Cardiovascular;  Laterality: N/A;  . LUMBAR LAMINECTOMY    . SHOULDER ARTHROSCOPY WITH ROTATOR CUFF REPAIR AND SUBACROMIAL DECOMPRESSION Right 11/23/2018   Procedure: SHOULDER ARTHROSCOPY WITH ROTATOR CUFF REPAIR AND SUBACROMIAL DECOMPRESSION;  Surgeon: [name], MD;  Location: [place];  Service: Orthopedics;  Laterality: Right;     Medications Prior to Admission: Prior to Admission medications   Medication Sig Start Date End Date Taking? Authorizing Provider  aspirin EC 81 MG tablet Take 1 tablet (81 mg total) by mouth daily. Swallow whole. 11/14/20   Tobb, Kardie, DO  fenofibrate 160 MG tablet Take 1 tablet (160 mg total) by mouth daily. Patient taking differently: Take 160 mg by mouth daily at 12 noon.  09/06/20   [name], DO  gabapentin (NEURONTIN) 800 MG tablet Take 1 tablet (800 mg total) by mouth 3 (three) times daily. 09/25/20   Hyatt, Max T, DPM  glimepiride ([name]) 4 MG tablet Take 1 tablet (4 mg total) by mouth daily before breakfast. 06/06/20   Shamleffer, Melanie Crazier, MD  glucose blood test strip Use as instructed 05/19/19   [name], Alferd Apa, DO  lisinopril (ZESTRIL) 5 MG tablet Take 1 tablet (5 mg total) by mouth daily. 12/10/20   [name], DO  metFORMIN ([name]) 500 MG 24 hr tablet Take 2 tablets (1,000 mg total) by mouth daily with breakfast AND 1 tablet (500 mg total) daily with supper. 11/21/20   End, Harrell Gave, MD  nitroGLYCERIN  (NITROSTAT) 0.4 MG SL tablet Place 1 tablet (0.4 mg total) under the tongue every 5 (five) minutes as needed for chest pain. 10/02/20 12/31/20  Tobb, Kardie, DO  omeprazole (PRILOSEC) 40 MG capsule 1 po bid Patient taking differently: Take 80 mg by mouth at bedtime.  06/12/20   Roma Schanz R, DO  ondansetron (ZOFRAN) 4 MG tablet Take 1 tablet (4 mg total) by mouth every 8 (eight) hours as needed. Patient taking differently: Take 4 mg by mouth every 8 (eight) hours as needed for nausea or vomiting.  05/31/20   Roma Schanz R, DO  oxyCODONE-acetaminophen (PERCOCET/ROXICET) 5-325 MG tablet TAKE 1 TABLET BY MOUTH EVERY 6 HOURS AS NEEDED 11/26/20   [name], Alferd Apa, DO  ranolazine (RANEXA) 1000 MG SR tablet Take 1 tablet (1,000 mg total) by mouth 2 (two) times daily. 12/14/20   Tobb, Kardie, DO  rosuvastatin (CRESTOR) 40 MG tablet Take 1 tablet (40 mg total) by mouth daily. 12/11/20 03/11/21  Berniece Salines, DO  sennosides-docusate sodium (SENOKOT-S) 8.6-50 MG tablet Take 2 tablets by mouth daily. 11/23/18   [name], MD  sertraline (ZOLOFT) 100 MG tablet Take 1 tablet (100 mg total) by mouth daily. Patient taking differently: Take 100 mg by mouth at bedtime.  09/06/20   [name], DO  ticagrelor (BRILINTA) 90 MG TABS tablet Take 1 tablet (90 mg total) by mouth 2 (two) times daily. 11/20/20   Cheryln Manly, NP  tiZANidine (ZANAFLEX) 4 MG tablet TAKE 1 TABLET (4 MG TOTAL) BY MOUTH EVERY 6 (SIX) HOURS AS NEEDED FOR MUSCLE SPASMS. 05/11/19   [name], DO  traZODone (DESYREL) 50 MG tablet Take 0.5-1 tablets (25-50 mg total) by mouth at bedtime as needed for sleep. 12/10/20   [name], DO     Allergies:    Allergies  Allergen Reactions  . Niacin Anaphylaxis    Social History:   Social History   Socioeconomic History  . Marital status: Married    Spouse name: Lorriane Shire  . Number of children: 2  . Years of education: Not on file  . Highest  education level: Not on file  Occupational History  . Occupation: self employed    Fish farm manager: UNEMPLOYED  Tobacco Use  . Smoking status: Former Smoker    Years: 16.00    Types: Cigarettes    Quit date: 12/29/1985    Years since quitting: 35.0  . Smokeless tobacco: Never Used  Vaping Use  . Vaping Use: Never used  Substance and Sexual Activity  . Alcohol use: Yes    Alcohol/week: 4.0 - 5.0 standard drinks    Types: 4 - 5 Cans of beer per week    Comment: 5 times per week  . Drug use: No  . Sexual activity: Yes    Partners: Female  Other Topics Concern  . Not on file  Social History Narrative   Exercise-- 3 days   Pt has hs degree   Social Determinants of Health   Financial Resource Strain: Not on file  Food Insecurity: Not on file  Transportation Needs: Not on file  Physical Activity: Not on file  Stress: Not on file  Social Connections: Not on file  Intimate Partner Violence: Not on file    Family History:   The patient's family history includes Alcohol abuse in an other family member; Arthritis in an other family member; COPD in his mother; Colon cancer in an other family member; Coronary artery disease in an other family member; Depression in an other family member; Heart disease in his sister and sister; Hypertension in an other family member; Ovarian cancer in his sister and other family members; Stomach cancer in his maternal grandmother and sister; Stroke in his father.    ROS:  Please see the history of present illness.  All other ROS reviewed and negative.     Physical Exam/Data:   Vitals:   12/24/20 0230 12/24/20 0250 12/24/20 0350 12/24/20 0400  BP: 117/62 106/78 123/62 121/83  Pulse: (!) 57 (!) 56 (!) 51 (!) 53  Resp: 15 17 20 16   Temp:  98.3 F (36.8 C) 98.4 F (36.9 C)   TempSrc:   Oral   SpO2: 98% 100% 98% 97%  Weight:   96.6 kg   Height:   6' (1.829 m)    No intake or output data in the 24 hours ending 12/24/20 0426 Last 3 Weights 12/24/2020  12/23/2020 11/26/2020  Weight (lbs) 212 lb 15.4 oz 205  lb 215 lb 1.6 oz  Weight (kg) 96.6 kg 92.987 kg 97.569 kg     Body mass index is 28.88 kg/m.  General:  Well nourished, well developed, in no acute distress HEENT: normal Lymph: no adenopathy Neck: no JVD Endocrine:  No thryomegaly Vascular: No carotid bruits; FA pulses 2+ bilaterally without bruits  Cardiac:  normal S1, S2; RRR; no murmur  Lungs:  clear to auscultation bilaterally, no wheezing, rhonchi or rales  Abd: soft, nontender, no hepatomegaly  Ext: no edema Musculoskeletal:  No deformities, BUE and BLE strength normal and equal Skin: warm and dry  Neuro:  CNs 2-12 intact, no focal abnormalities noted Psych:  Normal affect   EKG:  The ECG that was done on 12/24/20 was personally reviewed and demonstrates sinus bradycardia, IVCD   Relevant CV Studies: LHC (10/2020): 1. Three vessel CAD, including 80-90% proximal/mid LAD stenosis involving D1, 70% proximal LCx and 80-90% ostial OM1 stenoses, and diffuse mild to moderate RCA plaquing with 70% lesion in rPLA. 2. Normal left ventricular systolic function with mildly elevated filling pressure. 3. Successful IVUS-guided PCI to proximal/mid LAD using Resolute Onyx 3.5 x 30 mm DES and PTCA to ostial D1. 4. Successful IVUS-guided PCI to ostial/proximal LCx extending into OM1 using overlapping Resolute Onyx 3.5 x 8 mm and 3.0 x 34 mm drug-eluting stents.  TTE (10/2020): 1. Left ventricular ejection fraction, by estimation, is 60 to 65%. The  left ventricle has normal function. The left ventricle has no regional  wall motion abnormalities. Left ventricular diastolic parameters are  consistent with Grade I diastolic  dysfunction (impaired relaxation).  2. Right ventricular systolic function is normal. The right ventricular  size is normal. There is normal pulmonary artery systolic pressure.  3. The mitral valve is degenerative. No evidence of mitral valve  regurgitation. No  evidence of mitral stenosis.  4. The aortic valve is tricuspid. Aortic valve regurgitation is not  visualized. Mild aortic valve sclerosis is present, with no evidence of  aortic valve stenosis.  5. There is mild dilatation of the ascending aorta, measuring 36 mm.  6. The inferior vena cava is normal in size with greater than 50%  respiratory variability, suggesting right atrial pressure of 3 mmHg.   Laboratory Data:  High Sensitivity Troponin:   Recent Labs  Lab 12/23/20 2137 12/23/20 2340  TROPONINIHS 6 6      Chemistry Recent Labs  Lab 12/23/20 2137  NA 134*  K 3.8  CL 100  CO2 24  GLUCOSE 120*  BUN 30*  CREATININE 0.91  CALCIUM 9.6  GFRNONAA >60  ANIONGAP 10    No results for input(s): PROT, ALBUMIN, AST, ALT, ALKPHOS, BILITOT in the last 168 hours. Hematology Recent Labs  Lab 12/23/20 2137  WBC 9.9  RBC 4.97  HGB 14.5  HCT 41.3  MCV 83.1  MCH 29.2  MCHC 35.1  RDW 12.4  PLT 221   BNPNo results for input(s): BNP, PROBNP in the last 168 hours.  DDimer No results for input(s): DDIMER in the last 168 hours.   Radiology/Studies:  DG Chest Port 1 View  Result Date: 12/23/2020 CLINICAL DATA:  Chest pain EXAM: PORTABLE CHEST 1 VIEW COMPARISON:  Coronary CT 11/13/2020, chest radiograph 10/03/2008 FINDINGS: Some chronically coarsened interstitial and bronchitic features with hyperinflation are similar to prior. No new superimposed consolidative opacity. No pneumothorax or effusion. Slight pulmonary vascular congestion and cardiomegaly, the latter of which may be accentuated by abundant mediastinal fat seen on the comparison CT.  The aorta is calcified. The remaining cardiomediastinal contours are unremarkable. No acute osseous or soft tissue abnormality. Degenerative changes are present in the imaged spine and shoulders. Telemetry leads overlie the chest. IMPRESSION: 1. Slight pulmonary vascular congestion and cardiomegaly, the latter of which may be accentuated by  abundant mediastinal fat seen on the comparison CT. No convincing features of edema or consolidation. 2. More chronic interstitial and bronchitic features with hyperinflation. Electronically Signed   By: Lovena Le M.D.   On: 12/23/2020 22:20    Assessment and Plan:   Mr. Alberg presents this evening with 24 hours of sputtering chest discomfort in the setting of known 3 vessel disease and recent stenting. There are certainly some very reassuring features of Mr. Field's presentation. His troponins were negative with ischemic EKG changes. He is hemodynamically stable. There are no other associated symptoms, and he generally appears well. I am concerned, however, that this discomfort woke him from sleep. I am also concerned that he notes this is markedly different from prior pain he has had. I think musculoskeletal pain and/or GERD could also explain his symptoms, but given he is relatively high risk, these will certainly need to be diagnoses of exclusion. His medical therapy for chronic angina has been limited to Imdur and ranexa, and Imdur was previously d/c'd because of headaches. We certainly have some room to optimize this, but I do wonder if he may need repeat catheterization. These evening, I would like to start empiric anticoagulation given this pain has been persistent, is new, and has only been minimally responsive to nitroglycerin/morphine. We will repeat his troponins in the AM. We can reassess where he's at in the AM, and then consider repeat catheterization vs optimization of his anginal therapy.   #Chest Pain #CAD - Obs admit. Repeat troponin in AM  - If persistent CP will need to consider repeat LHC - Cont ASA + ticag - Start heparin ggt  - Cont crestor  - Cont prn nitro - Cont ranexa  - Consider BB and/or CCB at d/c   #Essential Hypertension: Well controlled - Cont lisinopril   #Type 2 Diabetes Mellitus: - Hold metformin + glipizide while in house - Would start on SGLT2i  (likely in lieu of glipizide) at d/c   - SSI for now   #Depression: - Cont sertraline   HEART Score (for undifferentiated chest pain): 4   Severity of Illness: The appropriate patient status for this patient is OBSERVATION. Observation status is judged to be reasonable and necessary in order to provide the required intensity of service to ensure the patient's safety. The patient's presenting symptoms, physical exam findings, and initial radiographic and laboratory data in the context of their medical condition is felt to place them at decreased risk for further clinical deterioration. Furthermore, it is anticipated that the patient will be medically stable for discharge from the hospital within 2 midnights of admission. The following factors support the patient status of observation.   " The patient's presenting symptoms include Chest pain " The physical exam findings include Chest pain. " The initial radiographic and laboratory data are negative troponin/EKG.  For questions or updates, please contact [place] Please consult [email or web address] for contact info under     Signed, Ronaldo Miyamoto, MD  12/24/2020 4:26 AM

## 2020-12-24 NOTE — Discharge Instructions (Signed)
PLEASE REMEMBER TO BRING ALL OF YOUR MEDICATIONS TO EACH OF YOUR FOLLOW-UP OFFICE VISITS.  PLEASE ATTEND ALL SCHEDULED FOLLOW-UP APPOINTMENTS.   Activity: Increase activity slowly as tolerated. You may shower, but no soaking baths (or swimming) for 1 week. No driving for 24 hours. No lifting over 5 lbs for 1 week. No sexual activity for 1 week.   You May Return to Work: in 1 week (if applicable)  Wound Care: You may wash cath site gently with soap and water. Keep cath site clean and dry. If you notice pain, swelling, bleeding or pus at your cath site, please call [phone].   You can restart your metformin on 12/27/20 as previously prescribed.   Please continue to take your aspirin and brilinta as prescribed. Missing doses of these medications puts you at risk for forming a blockage in your stents and having a heart attack.    Radial Site Care  This sheet gives you information about how to care for yourself after your procedure. Your health care provider may also give you more specific instructions. If you have problems or questions, contact your health care provider. What can I expect after the procedure? After the procedure, it is common to have:  Bruising and tenderness at the catheter insertion area. Follow these instructions at home: Medicines  Take over-the-counter and prescription medicines only as told by your health care provider. Insertion site care  Follow instructions from your health care provider about how to take care of your insertion site. Make sure you: ? Wash your hands with soap and water before you change your bandage (dressing). If soap and water are not available, use hand sanitizer. ? Change your dressing as told by your health care provider. ? Leave stitches (sutures), skin glue, or adhesive strips in place. These skin closures may need to stay in place for 2 weeks or longer. If adhesive strip edges start to loosen and curl up, you may trim the loose edges.  Do not remove adhesive strips completely unless your health care provider tells you to do that.  Check your insertion site every day for signs of infection. Check for: ? Redness, swelling, or pain. ? Fluid or blood. ? Pus or a bad smell. ? Warmth.  Do not take baths, swim, or use a hot tub until your health care provider approves.  You may shower 24-48 hours after the procedure, or as directed by your health care provider. ? Remove the dressing and gently wash the site with plain soap and water. ? Pat the area dry with a clean towel. ? Do not rub the site. That could cause bleeding.  Do not apply powder or lotion to the site. Activity   For 24 hours after the procedure, or as directed by your health care provider: ? Do not flex or bend the affected arm. ? Do not push or pull heavy objects with the affected arm. ? Do not drive yourself home from the hospital or clinic. You may drive 24 hours after the procedure unless your health care provider tells you not to. ? Do not operate machinery or power tools.  Do not lift anything that is heavier than 10 lb (4.5 kg), or the limit that you are told, until your health care provider says that it is safe.  Ask your health care provider when it is okay to: ? Return to work or school. ? Resume usual physical activities or sports. ? Resume sexual activity. General instructions  If the catheter  site starts to bleed, raise your arm and put firm pressure on the site. If the bleeding does not stop, get help right away. This is a medical emergency.  If you went home on the same day as your procedure, a responsible adult should be with you for the first 24 hours after you arrive home.  Keep all follow-up visits as told by your health care provider. This is important. Contact a health care provider if:  You have a fever.  You have redness, swelling, or yellow drainage around your insertion site. Get help right away if:  You have unusual pain  at the radial site.  The catheter insertion area swells very fast.  The insertion area is bleeding, and the bleeding does not stop when you hold steady pressure on the area.  Your arm or hand becomes pale, cool, tingly, or numb. These symptoms may represent a serious problem that is an emergency. Do not wait to see if the symptoms will go away. Get medical help right away. Call your local emergency services (911 in the [place] Do not drive yourself to the hospital. Summary  After the procedure, it is common to have bruising and tenderness at the site.  Follow instructions from your health care provider about how to take care of your radial site wound. Check the wound every day for signs of infection.  Do not lift anything that is heavier than 10 lb (4.5 kg), or the limit that you are told, until your health care provider says that it is safe. This information is not intended to replace advice given to you by your health care provider. Make sure you discuss any questions you have with your health care provider. Document Revised: 01/20/2018 Document Reviewed: 01/20/2018 Elsevier Patient Education  2020 [employer].

## 2020-12-24 NOTE — Progress Notes (Signed)
Pt called to go to the bathroom slight oozing noted from right radial TRB. 6cc added back pt ambulated to the bathroom to void

## 2020-12-24 NOTE — Plan of Care (Signed)

## 2020-12-24 NOTE — H&P (View-Only) (Signed)
Progress Note  Patient Name: Ronald Petersen Date of Encounter: 12/24/2020  [facility] Cardiologist: Thomasene Ripple, DO   Subjective   Persistent CP; no dyspnea  Inpatient Medications    Scheduled Meds: . aspirin EC  81 mg Oral Daily  . fenofibrate  160 mg Oral Daily  . gabapentin  800 mg Oral TID  . insulin aspart  0-15 Units Subcutaneous TID WC  . lisinopril  5 mg Oral Daily  . pantoprazole  40 mg Oral Daily  . ranolazine  1,000 mg Oral BID  . rosuvastatin  40 mg Oral Daily  . sertraline  100 mg Oral QHS  . ticagrelor  90 mg Oral BID   Continuous Infusions: . heparin 1,250 Units/hr (12/24/20 0432)   PRN Meds: acetaminophen, morphine injection, nitroGLYCERIN, ondansetron (ZOFRAN) IV, tiZANidine, traZODone   Vital Signs    Vitals:   12/24/20 0230 12/24/20 0250 12/24/20 0350 12/24/20 0400  BP: 117/62 106/78 123/62 121/83  Pulse: (!) 57 (!) 56 (!) 51 (!) 53  Resp: 15 17 20 16   Temp:  98.3 F (36.8 C) 98.4 F (36.9 C)   TempSrc:   Oral   SpO2: 98% 100% 98% 97%  Weight:   96.6 kg   Height:   6' (1.829 m)    No intake or output data in the 24 hours ending 12/24/20 0721 Last 3 Weights 12/24/2020 12/23/2020 11/26/2020  Weight (lbs) 212 lb 15.4 oz 205 lb 215 lb 1.6 oz  Weight (kg) 96.6 kg 92.987 kg 97.569 kg      Telemetry    Sinus with rare PVC- Personally Reviewed  ECG    Sinus bradycardia with no ST changes.- Personally Reviewed  Physical Exam   GEN: No acute distress.   Neck: No JVD Cardiac: RRR, no murmurs, rubs, or gallops.  Respiratory: Clear to auscultation bilaterally. GI: Soft, nontender, non-distended  MS: No edema Neuro:  Nonfocal  Psych: Normal affect   Labs    High Sensitivity Troponin:   Recent Labs  Lab 12/23/20 2137 12/23/20 2340  TROPONINIHS 6 6      Chemistry Recent Labs  Lab 12/23/20 2137  NA 134*  K 3.8  CL 100  CO2 24  GLUCOSE 120*  BUN 30*  CREATININE 0.91  CALCIUM 9.6  GFRNONAA >60  ANIONGAP 10      Hematology Recent Labs  Lab 12/23/20 2137  WBC 9.9  RBC 4.97  HGB 14.5  HCT 41.3  MCV 83.1  MCH 29.2  MCHC 35.1  RDW 12.4  PLT 221    Radiology    DG Chest Port 1 View  Result Date: 12/23/2020 CLINICAL DATA:  Chest pain EXAM: PORTABLE CHEST 1 VIEW COMPARISON:  Coronary CT 11/13/2020, chest radiograph 10/03/2008 FINDINGS: Some chronically coarsened interstitial and bronchitic features with hyperinflation are similar to prior. No new superimposed consolidative opacity. No pneumothorax or effusion. Slight pulmonary vascular congestion and cardiomegaly, the latter of which may be accentuated by abundant mediastinal fat seen on the comparison CT. The aorta is calcified. The remaining cardiomediastinal contours are unremarkable. No acute osseous or soft tissue abnormality. Degenerative changes are present in the imaged spine and shoulders. Telemetry leads overlie the chest. IMPRESSION: 1. Slight pulmonary vascular congestion and cardiomegaly, the latter of which may be accentuated by abundant mediastinal fat seen on the comparison CT. No convincing features of edema or consolidation. 2. More chronic interstitial and bronchitic features with hyperinflation. Electronically Signed   By: 12/03/2008 M.D.   On:  12/23/2020 22:20    Patient Profile     64 y.o. male with past medical history of coronary artery disease (status post drug-eluting stent to proximal LAD, left circumflex/OM1 and PTCA of diagonal November 2021) diabetes mellitus, hypertension, hyperlipidemia, chronic neck/back pain admitted with recurrent chest pain.  Assessment & Plan    1 chest pain-symptoms are atypical.  They have been persistent for 24 hours though worse at times with some improvement with nitroglycerin.  Some worsening with moving left upper extremity.  Question musculoskeletal.  Enzymes are negative.  Electrocardiogram shows no ST changes.  Patient is very concerned about his symptoms.  He has had intermittent  chest pain since his previous PCI in November.  I feel that definitive evaluation is warranted.  We will arrange cardiac catheterization.  The risks and benefits including myocardial infarction, CVA and death discussed and he agrees to proceed.  2 coronary artery disease-continue aspirin, Brilinta and Crestor.  We will continue ranolazine for intermittent chest pain.  3 hypertension-patient's blood pressure is controlled.  Continue present medications and follow.  4 hyperlipidemia-continue Crestor and fenofibrate.  5 diabetes mellitus-hold Metformin for 48 hours following catheterization.  For questions or updates, please contact [facility] Please consult [email or web address] for contact info under        Signed, Mekaila Tarnow, MD  12/24/2020, 7:21 AM    

## 2020-12-25 ENCOUNTER — Encounter (HOSPITAL_COMMUNITY): Payer: Self-pay | Admitting: Cardiology

## 2020-12-27 ENCOUNTER — Encounter (INDEPENDENT_AMBULATORY_CARE_PROVIDER_SITE_OTHER): Payer: Self-pay

## 2020-12-31 ENCOUNTER — Ambulatory Visit: Payer: Medicare HMO | Admitting: Orthotics

## 2021-01-02 ENCOUNTER — Other Ambulatory Visit: Payer: Self-pay | Admitting: Family Medicine

## 2021-01-02 DIAGNOSIS — G8929 Other chronic pain: Secondary | ICD-10-CM

## 2021-01-02 NOTE — Telephone Encounter (Signed)
Requesting: oxycodone 5-325mg  Contract: 11/14/2019 UDS: 11/14/2019 Last Visit: 09/06/2020 Next Visit: 03/07/2021 Last Refill: 11/26/2020 #90 and 0RF Pt sig: 1 tab tid  Please Advise

## 2021-01-03 ENCOUNTER — Ambulatory Visit (INDEPENDENT_AMBULATORY_CARE_PROVIDER_SITE_OTHER): Payer: Medicare HMO | Admitting: Orthotics

## 2021-01-03 ENCOUNTER — Other Ambulatory Visit: Payer: Self-pay

## 2021-01-03 ENCOUNTER — Other Ambulatory Visit: Payer: Self-pay | Admitting: Family Medicine

## 2021-01-03 DIAGNOSIS — M2042 Other hammer toe(s) (acquired), left foot: Secondary | ICD-10-CM

## 2021-01-03 DIAGNOSIS — E1142 Type 2 diabetes mellitus with diabetic polyneuropathy: Secondary | ICD-10-CM | POA: Diagnosis not present

## 2021-01-03 DIAGNOSIS — M2041 Other hammer toe(s) (acquired), right foot: Secondary | ICD-10-CM | POA: Diagnosis not present

## 2021-01-03 DIAGNOSIS — M2040 Other hammer toe(s) (acquired), unspecified foot: Secondary | ICD-10-CM

## 2021-01-03 MED FILL — ONDANSETRON HCL 4 MG TABLET: 4 | 7 days supply | Qty: 20 | Fill #1

## 2021-01-03 NOTE — Progress Notes (Signed)

## 2021-01-04 ENCOUNTER — Telehealth: Payer: Self-pay | Admitting: *Deleted

## 2021-01-04 MED FILL — OXYCODONE-ACETAMINOPHEN 5-3: 5-325 | 23 days supply | Qty: 90 | Fill #0

## 2021-01-04 NOTE — Telephone Encounter (Signed)
So I really prefer he not take 2--- how often does he take 2?

## 2021-01-04 NOTE — Telephone Encounter (Signed)
[facility] sent request for trazadone and stated that patient sometimes take 2 tablets.  They would like you to send new rx reflecting change.

## 2021-01-07 NOTE — Telephone Encounter (Signed)
Spoke with patient he stated that the medication does not work anyway.  He has not taken any in about 2 days cause it does not work.  He is also taking melatonin 5mg  and that does not seem to work. He would like to see if there is anything else he could possibly try?  He really think it the new blood thinner that he is on that is making him feel this way.

## 2021-01-08 ENCOUNTER — Other Ambulatory Visit: Payer: Self-pay | Admitting: Family Medicine

## 2021-01-08 DIAGNOSIS — G4700 Insomnia, unspecified: Secondary | ICD-10-CM

## 2021-01-08 MED ORDER — BELSOMRA 10 MG PO TABS: 10.0000 mg | ORAL_TABLET | Freq: Every evening | ORAL | 0 refills | Status: DC | PRN

## 2021-01-08 MED FILL — BELSOMRA 10 MG TABLET: 10 | 30 days supply | Qty: 30 | Fill #0

## 2021-01-08 NOTE — Telephone Encounter (Signed)
Spoke with patient.  Medication sent to pharmacy

## 2021-01-08 NOTE — Telephone Encounter (Signed)
Belsomra 10 mg #30  1 po qhs prn --- can take 2 the next night if 1 does not work  If he needs 2 he needs to let us know

## 2021-01-08 NOTE — Addendum Note (Signed)
Addended by: Wynonia Musty A on: 01/08/2021 03:17 PM   Modules accepted: Orders

## 2021-01-15 ENCOUNTER — Ambulatory Visit: Payer: Medicare HMO | Admitting: Internal Medicine

## 2021-01-17 ENCOUNTER — Ambulatory Visit (INDEPENDENT_AMBULATORY_CARE_PROVIDER_SITE_OTHER): Payer: Medicare HMO

## 2021-01-17 ENCOUNTER — Other Ambulatory Visit: Payer: Self-pay | Admitting: Cardiology

## 2021-01-17 ENCOUNTER — Ambulatory Visit: Payer: Medicare HMO | Admitting: Cardiology

## 2021-01-17 ENCOUNTER — Encounter: Payer: Self-pay | Admitting: Cardiology

## 2021-01-17 ENCOUNTER — Other Ambulatory Visit: Payer: Self-pay | Admitting: Family Medicine

## 2021-01-17 ENCOUNTER — Other Ambulatory Visit: Payer: Self-pay

## 2021-01-17 VITALS — BP 122/72 | HR 60 | Ht 72.0 in | Wt 210.0 lb

## 2021-01-17 DIAGNOSIS — R5383 Other fatigue: Secondary | ICD-10-CM

## 2021-01-17 DIAGNOSIS — G4733 Obstructive sleep apnea (adult) (pediatric): Secondary | ICD-10-CM | POA: Diagnosis not present

## 2021-01-17 DIAGNOSIS — R42 Dizziness and giddiness: Secondary | ICD-10-CM

## 2021-01-17 DIAGNOSIS — E8881 Metabolic syndrome: Secondary | ICD-10-CM | POA: Diagnosis not present

## 2021-01-17 DIAGNOSIS — I2510 Atherosclerotic heart disease of native coronary artery without angina pectoris: Secondary | ICD-10-CM | POA: Diagnosis not present

## 2021-01-17 DIAGNOSIS — IMO0002 Reserved for concepts with insufficient information to code with codable children: Secondary | ICD-10-CM

## 2021-01-17 DIAGNOSIS — E1169 Type 2 diabetes mellitus with other specified complication: Secondary | ICD-10-CM

## 2021-01-17 DIAGNOSIS — E1151 Type 2 diabetes mellitus with diabetic peripheral angiopathy without gangrene: Secondary | ICD-10-CM | POA: Diagnosis not present

## 2021-01-17 DIAGNOSIS — E785 Hyperlipidemia, unspecified: Secondary | ICD-10-CM | POA: Diagnosis not present

## 2021-01-17 DIAGNOSIS — G4730 Sleep apnea, unspecified: Secondary | ICD-10-CM | POA: Diagnosis not present

## 2021-01-17 DIAGNOSIS — G8929 Other chronic pain: Secondary | ICD-10-CM

## 2021-01-17 DIAGNOSIS — E1165 Type 2 diabetes mellitus with hyperglycemia: Secondary | ICD-10-CM

## 2021-01-17 MED ORDER — ISOSORBIDE MONONITRATE ER 30 MG PO TB24: 30.0000 mg | ORAL_TABLET | Freq: Every day | ORAL | 1 refills | Status: DC

## 2021-01-17 MED ORDER — RANOLAZINE ER 1000 MG PO TB12: 1000.0000 mg | ORAL_TABLET | Freq: Two times a day (BID) | ORAL | 3 refills | Status: DC

## 2021-01-17 MED FILL — ISOSORBIDE MN ER 30 MG TAB: 30 | 90 days supply | Qty: 90 | Fill #0

## 2021-01-17 MED FILL — RANOLAZINE ER 1000 MG TB12: 1000 | 30 days supply | Qty: 60 | Fill #0

## 2021-01-17 NOTE — Patient Instructions (Signed)
Medication Instructions:  Your physician has recommended you make the following change in your medication:  \ START: Imdur 30 mg daily   *If you need a refill on your cardiac medications before your next appointment, please call your pharmacy*   Lab Work: Your physician recommends that you return for lab work today: vitamin d, bmp, mg  If you have labs (blood work) drawn today and your tests are completely normal, you will receive your results only by: Marland Kitchen MyChart Message (if you have MyChart) OR . A paper copy in the mail If you have any lab test that is abnormal or we need to change your treatment, we will call you to review the results.   Testing/Procedures: A zio monitor was ordered today. It will remain on for 3 days. You will then return monitor and event diary in provided box. It takes 1-2 weeks for report to be downloaded and returned to [place]. We will call you with the results. If monitor falls off or has orange flashing light, please call Zio for further instructions.    Your physician has recommended that you have a sleep study. This test records several body functions during sleep, including: brain activity, eye movement, oxygen and carbon dioxide blood levels, heart rate and rhythm, breathing rate and rhythm, the flow of air through your mouth and nose, snoring, body muscle movements, and chest and belly movement.    Follow-Up: At [facility] you and your health needs are our priority.  As part of our continuing mission to provide you with exceptional heart care, we have created designated Provider Care Teams.  These Care Teams include your primary Cardiologist (physician) and Advanced Practice Providers (APPs -  Physician Assistants and Nurse Practitioners) who all work together to provide you with the care you need, when you need it.  We recommend signing up for the patient portal called "MyChart".  Sign up information is provided on this After Visit Summary.  MyChart is used  to connect with patients for Virtual Visits (Telemedicine).  Patients are able to view lab/test results, encounter notes, upcoming appointments, etc.  Non-urgent messages can be sent to your provider as well.   To learn more about what you can do with MyChart, go to [email or web address].    Your next appointment:   4 week(s)  The format for your next appointment:   In Person  Provider:   Berniece Salines, DO   Other Instructions  Isosorbide Mononitrate extended-release tablets What is this medicine? ISOSORBIDE MONONITRATE (eye soe SOR bide mon oh NYE trate) is a vasodilator. It relaxes blood vessels, increasing the blood and oxygen supply to your heart. This medicine is used to prevent chest pain caused by angina. It will not help to stop an episode of chest pain. This medicine may be used for other purposes; ask your health care provider or pharmacist if you have questions. COMMON BRAND NAME(S): Imdur, Isotrate ER What should I tell my health care provider before I take this medicine? They need to know if you have any of these conditions:  previous heart attack or heart failure  an unusual or allergic reaction to isosorbide mononitrate, nitrates, other medicines, foods, dyes, or preservatives  pregnant or trying to get pregnant  breast-feeding How should I use this medicine? Take this medicine by mouth with a glass of water. Follow the directions on the prescription label. Do not crush or chew. Take your medicine at regular intervals. Do not take your medicine more often than directed.  Do not stop taking this medicine except on the advice of your doctor or health care professional. Talk to your pediatrician regarding the use of this medicine in children. Special care may be needed. Overdosage: If you think you have taken too much of this medicine contact a poison control center or emergency room at once. NOTE: This medicine is only for you. Do not share this medicine with  others. What if I miss a dose? If you miss a dose, take it as soon as you can. If it is almost time for your next dose, take only that dose. Do not take double or extra doses. What may interact with this medicine? Do not take this medicine with any of the following medications:  medicines used to treat erectile dysfunction (ED) like avanafil, sildenafil, tadalafil, and vardenafil  riociguat This medicine may also interact with the following medications:  medicines for high blood pressure  other medicines for angina or heart failure This list may not describe all possible interactions. Give your health care provider a list of all the medicines, herbs, non-prescription drugs, or dietary supplements you use. Also tell them if you smoke, drink alcohol, or use illegal drugs. Some items may interact with your medicine. What should I watch for while using this medicine? Check your heart rate and blood pressure regularly while you are taking this medicine. Ask your doctor or health care professional what your heart rate and blood pressure should be and when you should contact him or her. Tell your doctor or health care professional if you feel your medicine is no longer working. You may get dizzy. Do not drive, use machinery, or do anything that needs mental alertness until you know how this medicine affects you. To reduce the risk of dizzy or fainting spells, do not sit or stand up quickly, especially if you are an older patient. Alcohol can make you more dizzy, and increase flushing and rapid heartbeats. Avoid alcoholic drinks. Do not treat yourself for coughs, colds, or pain while you are taking this medicine without asking your doctor or health care professional for advice. Some ingredients may increase your blood pressure. What side effects may I notice from receiving this medicine? Side effects that you should report to your doctor or health care professional as soon as possible:  bluish  discoloration of lips, fingernails, or palms of hands  irregular heartbeat, palpitations  low blood pressure  nausea, vomiting  persistent headache  unusually weak or tired Side effects that usually do not require medical attention (report to your doctor or health care professional if they continue or are bothersome):  flushing of the face or neck  rash This list may not describe all possible side effects. Call your doctor for medical advice about side effects. You may report side effects to FDA at 1-800-FDA-1088. Where should I keep my medicine? Keep out of the reach of children. Store between 15 and 30 degrees C (59 and 86 degrees F). Keep container tightly closed. Throw away any unused medicine after the expiration date. NOTE: This sheet is a summary. It may not cover all possible information. If you have questions about this medicine, talk to your doctor, pharmacist, or health care provider.  2021 Elsevier/Gold Standard (2013-10-14 14:48:19)   Sleep Study, Adult A sleep study (polysomnogram) is a series of tests done while you are sleeping. A sleep study records your brain waves, heart rate, breathing rate, oxygen level, and eye and leg movements. A sleep study helps your health  care provider:  See how well you sleep.  Diagnose a sleep disorder.  Determine how severe your sleep disorder is.  Create a plan to treat your sleep disorder. Your health care provider may recommend a sleep study if you:  Feel sleepy on most days.  Snore loudly while sleeping.  Have unusual behaviors while you sleep, such as walking.  Have brief periods in which you stop breathing during sleep (sleepapnea).  Fall asleep suddenly during the day (narcolepsy).  Have trouble falling asleep or staying asleep (insomnia).  Feel like you need to move your legs when trying to fall asleep (restless legs syndrome).  Move your legs by flexing and extending them regularly while asleep (periodic limb  movement disorder).  Act out your dreams while you sleep (sleep behavior disorder).  Feel like you cannot move when you first wake up (sleep paralysis). What tests are part of a sleep study? Most sleep studies record the following during sleep:  Brain activity.  Eye movements.  Heart rate and rhythm.  Breathing rate and rhythm.  Blood-oxygen level.  Blood pressure.  Chest and belly movement as you breathe.  Arm and leg movements.  Snoring or other noises.  Body position. Where are sleep studies done? Sleep studies are done at sleep centers. A sleep center may be inside a hospital, office, or clinic. The room where you have the study may look like a hospital room or a hotel room. The health care providers doing the study may come in and out of the room during the study. Most of the time, they will be in another room monitoring your test as you sleep. How are sleep studies done? Most sleep studies are done during a normal period of time for a full night of sleep. You will arrive at the study center in the evening and go home in the morning. Before the test  Bring your pajamas and toothbrush with you to the sleep study.  Do not have caffeine on the day of your sleep study.  Do not drink alcohol on the day of your sleep study.  Your health care provider will let you know if you should stop taking any of your regular medicines before the test. During the test  Round, sticky patches with sensors attached to recording wires (electrodes) are placed on your scalp, face, chest, and limbs.  Wires from all the electrodes and sensors run from your bed to a computer. The wires can be taken off and put back on if you need to get out of bed to go to the bathroom.  A sensor is placed over your nose to measure airflow.  A finger clip is put on your finger or ear to measure your blood oxygen level (pulse oximetry).  A belt is placed around your belly and a belt is placed around your  chest to measure breathing movements.  If you have signs of the sleep disorder called sleep apnea during your test, you may get a treatment mask to wear for the second half of the night. ? The mask provides positive airway pressure (PAP) to help you breathe better during sleep. This may greatly improve your sleep apnea. ? You will then have all tests done again with the mask in place to see if your measurements and recordings change.      After the test  A medical doctor who specializes in sleep will evaluate the results of your sleep study and share them with you and your primary health  care provider.  Based on your results, your medical history, and a physical exam, you may be diagnosed with a sleep disorder, such as: ? Sleep apnea. ? Restless legs syndrome. ? Sleep-related behavior disorder. ? Sleep-related movement disorders. ? Sleep-related seizure disorders.  Your health care team will help determine your treatment options based on your diagnosis. This may include: ? Improving your sleep habits (sleep hygiene). ? Wearing a continuous positive airway pressure (CPAP) or bi-level positive airway pressure (BPAP) mask. ? Wearing an oral device at night to improve breathing and reduce snoring. ? Taking medicines. Follow these instructions at home:  Take over-the-counter and prescription medicines only as told by your health care provider.  If you are instructed to use a CPAP or BPAP mask, make sure you use it nightly as directed.  Make any lifestyle changes that your health care provider recommends.  If you were given a device to open your airway while you sleep, use it only as told by your health care provider.  Do not use any tobacco products, such as cigarettes, chewing tobacco, and e-cigarettes. If you need help quitting, ask your health care provider.  Keep all follow-up visits as told by your health care provider. This is important. Summary  A sleep study (polysomnogram)  is a series of tests done while you are sleeping. It shows how well you sleep.  Most sleep studies are done over one full night of sleep. You will arrive at the study center in the evening and go home in the morning.  If you have signs of the sleep disorder called sleep apnea during your test, you may get a treatment mask to wear for the second half of the night.  A medical doctor who specializes in sleep will evaluate the results of your sleep study and share them with your primary health care provider. This information is not intended to replace advice given to you by your health care provider. Make sure you discuss any questions you have with your health care provider. Document Revised: 01/20/2020 Document Reviewed: 01/12/2018 Elsevier Patient Education  2021 [employer].

## 2021-01-17 NOTE — Telephone Encounter (Signed)
Requesting: Percocet  Contract: 08/03/18: UDS: 11/14/19 Last Visit: 08/2020 Next Visit: 01/22/2021 Last Refill: Just refilled on 01/03/2021  Please Advise

## 2021-01-17 NOTE — Progress Notes (Signed)
Cardiology Office Note:    Date:  01/17/2021   ID:  Ronald Petersen, DOB Aug 11, 1956, MRN [id number]  PCP:  [name], Alferd Apa, DO  Cardiologist:  Berniece Salines, DO  Electrophysiologist:  None   Referring MD: [name], Alferd Apa, *   " I have dizziness and have had intermittent chest pain"  History of Present Illness:    Ronald Petersen is a 65 y.o. male with a hx of  hx of coronary artery disease status post recent PCI with drug-eluting stent to the LAD as well as the left circumflex and there is still residual 70% lesion in the RCA he is on aspirin Brilinta. Diabetes mellitus, hypertension, hyperlipidemia, obesity and OSA has admitted that he is not compliant with his CPAP presents today for follow-up visit.  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  During that time his Imdur was stopped and I started him on Ranexa.  I have also increase his Ranexa to 500 mg twice a day.  In December he was seen at the emergency department [facility] he subsequently underwent a left heart catheterization given the fact that he had had intermittent worsening chest pain.  Today he tells me that he is feeling significantly fatigue he feels daytime somnolence as well has he gets some dizziness when he is doing activity.  He is concerned about this.  He tells me intermittent chest pain also is a problem.  No other complaints at this time.   Past Medical History:  Diagnosis Date  . Anxiety   . Arthritis   . Cancer ([place])   . Complication of anesthesia    aspirated with back surgery at age 4  . Depression   . Diabetes mellitus   . GERD (gastroesophageal reflux disease)   . Hyperlipidemia   . Hypertension   . Neuromuscular disorder ([place])    NEUROPATHY  . Right rotator cuff tear 11/23/2018  . Sleep apnea    no CPAP    Past Surgical History:  Procedure Laterality Date   . APPENDECTOMY    . ARTHOSCOPIC ROTAOR CUFF REPAIR Right 11/23/2018   Procedure: ARTHROSCOPIC ROTATOR CUFF REPAIR;  Surgeon: [name], MD;  Location: [place];  Service: Orthopedics;  Laterality: Right;  . CHOLECYSTECTOMY    . CORONARY STENT INTERVENTION N/A 11/19/2020   Procedure: CORONARY STENT INTERVENTION;  Surgeon: Nelva Bush, MD;  Location: [place] [facility];  Service: Cardiovascular;  Laterality: N/A;  . INTRAVASCULAR ULTRASOUND/IVUS N/A 11/19/2020   Procedure: Intravascular Ultrasound/IVUS;  Surgeon: Nelva Bush, MD;  Location: [place] [facility];  Service: Cardiovascular;  Laterality: N/A;  . LEFT HEART CATH AND CORONARY ANGIOGRAPHY N/A 11/19/2020   Procedure: LEFT HEART CATH AND CORONARY ANGIOGRAPHY;  Surgeon: Nelva Bush, MD;  Location: [place] [facility];  Service: Cardiovascular;  Laterality: N/A;  . LEFT HEART CATH AND CORONARY ANGIOGRAPHY N/A 12/24/2020   Procedure: LEFT HEART CATH AND CORONARY ANGIOGRAPHY;  Surgeon: Martinique, Peter M, MD;  Location: [place] [facility];  Service: Cardiovascular;  Laterality: N/A;  . LUMBAR LAMINECTOMY    . SHOULDER ARTHROSCOPY WITH ROTATOR CUFF REPAIR AND SUBACROMIAL DECOMPRESSION Right 11/23/2018   Procedure: SHOULDER ARTHROSCOPY WITH ROTATOR CUFF REPAIR AND SUBACROMIAL DECOMPRESSION;  Surgeon: [name], MD;  Location: [place];  Service: Orthopedics;  Laterality: Right;    Current Medications: Current  Meds  Medication Sig  . aspirin EC 81 MG tablet Take 1 tablet (81 mg total) by mouth daily. Swallow whole.  . fenofibrate 160 MG tablet Take 1 tablet (160 mg total) by mouth daily. (Patient taking differently: Take 160 mg by mouth daily at 12 noon.)  . gabapentin (NEURONTIN) 800 MG tablet Take 1 tablet (800 mg total) by mouth 3 (three) times daily.  Marland Kitchen glimepiride ([name]) 4 MG tablet Take 1 tablet (4 mg total) by mouth daily before breakfast.  . glucose blood test strip Use as  instructed  . isosorbide mononitrate (IMDUR) 30 MG 24 hr tablet Take 1 tablet (30 mg total) by mouth daily.  Marland Kitchen lisinopril (ZESTRIL) 5 MG tablet Take 1 tablet (5 mg total) by mouth daily.  . metFORMIN ([name]) 500 MG 24 hr tablet Take 2 tablets (1,000 mg total) by mouth daily with breakfast AND 1 tablet (500 mg total) daily with supper.  Marland Kitchen omeprazole (PRILOSEC) 40 MG capsule 1 po bid (Patient taking differently: Take 80 mg by mouth at bedtime.)  . ondansetron (ZOFRAN) 4 MG tablet Take 1 tablet (4 mg total) by mouth every 8 (eight) hours as needed. (Patient taking differently: Take 4 mg by mouth every 8 (eight) hours as needed for nausea or vomiting.)  . oxyCODONE-acetaminophen (PERCOCET/ROXICET) 5-325 MG tablet TAKE 1 TABLET BY MOUTH EVERY 6 HOURS AS NEEDED  . rosuvastatin (CRESTOR) 40 MG tablet Take 1 tablet (40 mg total) by mouth daily.  . sennosides-docusate sodium (SENOKOT-S) 8.6-50 MG tablet Take 2 tablets by mouth daily. (Patient taking differently: Take 1 tablet by mouth daily as needed for constipation.)  . sertraline (ZOLOFT) 100 MG tablet Take 1 tablet (100 mg total) by mouth daily. (Patient taking differently: Take 100 mg by mouth at bedtime.)  . Suvorexant (BELSOMRA) 10 MG TABS Take 10 mg by mouth at bedtime as needed. May increase to two tablets (20 mg total) the next night if needed.  . ticagrelor (BRILINTA) 90 MG TABS tablet Take 1 tablet (90 mg total) by mouth 2 (two) times daily.  Marland Kitchen tiZANidine (ZANAFLEX) 4 MG tablet TAKE 1 TABLET (4 MG TOTAL) BY MOUTH EVERY 6 (SIX) HOURS AS NEEDED FOR MUSCLE SPASMS.  Marland Kitchen traZODone (DESYREL) 50 MG tablet Take 0.5-1 tablets (25-50 mg total) by mouth at bedtime as needed for sleep.  . [DISCONTINUED] ranolazine (RANEXA) 1000 MG SR tablet Take 1 tablet (1,000 mg total) by mouth 2 (two) times daily.     Allergies:   Niacin   Social History   Socioeconomic History  . Marital status: Married    Spouse name: Erie Noe  . Number of children: 2  .  Years of education: Not on file  . Highest education level: Not on file  Occupational History  . Occupation: self employed    Associate Professor: UNEMPLOYED  Tobacco Use  . Smoking status: Former Smoker    Years: 16.00    Types: Cigarettes    Quit date: 12/29/1985    Years since quitting: 35.0  . Smokeless tobacco: Never Used  Vaping Use  . Vaping Use: Never used  Substance and Sexual Activity  . Alcohol use: Yes    Alcohol/week: 4.0 - 5.0 standard drinks    Types: 4 - 5 Cans of beer per week    Comment: 5 times per week  . Drug use: No  . Sexual activity: Yes    Partners: Female  Other Topics Concern  . Not on file  Social History Narrative  Exercise-- 3 days   Pt has hs degree   Social Determinants of Health   Financial Resource Strain: Not on file  Food Insecurity: Not on file  Transportation Needs: Not on file  Physical Activity: Not on file  Stress: Not on file  Social Connections: Not on file     Family History: The patient's family history includes Alcohol abuse in an other family member; Arthritis in an other family member; COPD in his mother; Colon cancer in an other family member; Coronary artery disease in an other family member; Depression in an other family member; Heart disease in his sister and sister; Hypertension in an other family member; Ovarian cancer in his sister and other family members; Stomach cancer in his maternal grandmother and sister; Stroke in his father.  ROS:   Review of Systems  Constitution: Negative for decreased appetite, fever and weight gain.  HENT: Negative for congestion, ear discharge, hoarse voice and sore throat.   Eyes: Negative for discharge, redness, vision loss in right eye and visual halos.  Cardiovascular: Negative for chest pain, dyspnea on exertion, leg swelling, orthopnea and palpitations.  Respiratory: Negative for cough, hemoptysis, shortness of breath and snoring.   Endocrine: Negative for heat intolerance and polyphagia.   Hematologic/Lymphatic: Negative for bleeding problem. Does not bruise/bleed easily.  Skin: Negative for flushing, nail changes, rash and suspicious lesions.  Musculoskeletal: Negative for arthritis, joint pain, muscle cramps, myalgias, neck pain and stiffness.  Gastrointestinal: Negative for abdominal pain, bowel incontinence, diarrhea and excessive appetite.  Genitourinary: Negative for decreased libido, genital sores and incomplete emptying.  Neurological: Negative for brief paralysis, focal weakness, headaches and loss of balance.  Psychiatric/Behavioral: Negative for altered mental status, depression and suicidal ideas.  Allergic/Immunologic: Negative for HIV exposure and persistent infections.    EKGs/Labs/Other Studies Reviewed:    The following studies were reviewed today:   EKG: None today  Left heart catheterization December 24, 2020  1st Diag lesion is 25% stenosed.  Previously placed Prox LAD to Mid LAD drug eluting stent is widely patent.  Previously placed 1st Mrg drug eluting stent is widely patent.  Previously placed Prox Cx drug-eluting stents are widely patent.  RPAV lesion is 50% stenosed.  LV end diastolic pressure is normal.   1. Nonobstructive CAD. Excellent patency of stents in the LAD and LCx. Lesion in the PL branch reduced from 70% to 50%. 2. Normal LV filling pressures.    Recent Labs: 09/06/2020: ALT 24 10/02/2020: TSH 1.290 12/23/2020: Hemoglobin 14.5; Platelets 221 12/24/2020: BUN 24; Creatinine, Ser 0.83; Potassium 3.6; Sodium 136  Recent Lipid Panel    Component Value Date/Time   CHOL 157 09/06/2020 1138   TRIG 174 (H) 09/06/2020 1138   TRIG 148 12/10/2006 1603   HDL 34 (L) 09/06/2020 1138   CHOLHDL 4.6 09/06/2020 1138   VLDL 53.6 (H) 05/31/2020 0910   LDLCALC 96 09/06/2020 1138   LDLDIRECT 93.0 05/31/2020 0910    Physical Exam:    VS:  BP 122/72   Pulse 60   Ht 6' (1.829 m)   Wt 210 lb (95.3 kg)   SpO2 96%   BMI 28.48 kg/m      Wt Readings from Last 3 Encounters:  01/17/21 210 lb (95.3 kg)  12/24/20 212 lb 15.4 oz (96.6 kg)  11/26/20 215 lb 1.6 oz (97.6 kg)     GEN: Well nourished, well developed in no acute distress HEENT: Normal NECK: No JVD; No carotid bruits LYMPHATICS: No lymphadenopathy CARDIAC: S1S2  noted,RRR, no murmurs, rubs, gallops RESPIRATORY:  Clear to auscultation without rales, wheezing or rhonchi  ABDOMEN: Soft, non-tender, non-distended, +bowel sounds, no guarding. EXTREMITIES: No edema, No cyanosis, no clubbing MUSCULOSKELETAL:  No deformity  SKIN: Warm and dry NEUROLOGIC:  Alert and oriented x 3, non-focal PSYCHIATRIC:  Normal affect, good insight  ASSESSMENT:    1. Coronary artery disease, unspecified vessel or lesion type, unspecified whether angina present, unspecified whether native or transplanted heart   2. Obstructive sleep apnea syndrome   3. Hyperlipidemia, unspecified hyperlipidemia type   4. Metabolic syndrome   5. DM (diabetes mellitus) type II uncontrolled, periph vascular disorder ([place])   6. Hyperlipidemia associated with type 2 diabetes mellitus ([place])   7. Dizziness   8. Other fatigue    PLAN:     1.  I spoke with the patient today he is willing to retry Imdur in addition to Ranexa.  So we will start him on Imdur 30 mg daily and he will continue his Ranexa 1000 mg daily to see if this is going to help with his angina.    2.  He has had sleep apnea for over 15 years.  He tells me that he is really not compliant with the CPAP.  We talked about his fatigue and daytime somnolence be could be contributing to worsening sleep apnea.  He is willing to go ahead and get a new sleep study and hopefully be set up with a new CPAP.  We will send a referral for sleep study.  3. his dizziness is also an issue he has heart rate he tells me has been lower in the low 60s and high 50s at home.  We will place a monitor on the patient to understand and make sure that he is not under  getting any significant pauses or sick sinus syndrome is not playing a role here.   4. this is being managed by his primary care doctor.  No adjustments for antidiabetic medications were made today.   5.  Blood pressure is acceptable, continue with current antihypertensive regimen which includes  6. hyperlipidemia - continue with current statin medication.  7. blocker be done today which will include BMP, mag as well as vitamin D  The patient is in agreement with the above plan. The patient left the office in stable condition.  The patient will follow up in 4 weeks or sooner if needed   Medication Adjustments/Labs and Tests Ordered: Current medicines are reviewed at length with the patient today.  Concerns regarding medicines are outlined above.  Orders Placed This Encounter  Procedures  . Basic metabolic panel  . Magnesium  . Vitamin D 1,25 dihydroxy  . LONG TERM MONITOR (3-14 DAYS)  . Split night study   Meds ordered this encounter  Medications  . isosorbide mononitrate (IMDUR) 30 MG 24 hr tablet    Sig: Take 1 tablet (30 mg total) by mouth daily.    Dispense:  90 tablet    Refill:  1  . ranolazine (RANEXA) 1000 MG SR tablet    Sig: Take 1 tablet (1,000 mg total) by mouth 2 (two) times daily.    Dispense:  60 tablet    Refill:  3    Patient Instructions   Medication Instructions:  Your physician has recommended you make the following change in your medication:  \ START: Imdur 30 mg daily   *If you need a refill on your cardiac medications before your next appointment, please call your  pharmacy*   Lab Work: Your physician recommends that you return for lab work today: vitamin d, bmp, mg  If you have labs (blood work) drawn today and your tests are completely normal, you will receive your results only by: Marland Kitchen MyChart Message (if you have MyChart) OR . A paper copy in the mail If you have any lab test that is abnormal or we need to change your treatment, we will call  you to review the results.   Testing/Procedures: A zio monitor was ordered today. It will remain on for 3 days. You will then return monitor and event diary in provided box. It takes 1-2 weeks for report to be downloaded and returned to [place]. We will call you with the results. If monitor falls off or has orange flashing light, please call Zio for further instructions.    Your physician has recommended that you have a sleep study. This test records several body functions during sleep, including: brain activity, eye movement, oxygen and carbon dioxide blood levels, heart rate and rhythm, breathing rate and rhythm, the flow of air through your mouth and nose, snoring, body muscle movements, and chest and belly movement.    Follow-Up: At [facility], you and your health needs are our priority.  As part of our continuing mission to provide you with exceptional heart care, we have created designated Provider Care Teams.  These Care Teams include your primary Cardiologist (physician) and Advanced Practice Providers (APPs -  Physician Assistants and Nurse Practitioners) who all work together to provide you with the care you need, when you need it.  We recommend signing up for the patient portal called "MyChart".  Sign up information is provided on this After Visit Summary.  MyChart is used to connect with patients for Virtual Visits (Telemedicine).  Patients are able to view lab/test results, encounter notes, upcoming appointments, etc.  Non-urgent messages can be sent to your provider as well.   To learn more about what you can do with MyChart, go to [email or web address].    Your next appointment:   4 week(s)  The format for your next appointment:   In Person  Provider:   Berniece Salines, DO   Other Instructions  Isosorbide Mononitrate extended-release tablets What is this medicine? ISOSORBIDE MONONITRATE (eye soe SOR bide mon oh NYE trate) is a vasodilator. It relaxes blood vessels,  increasing the blood and oxygen supply to your heart. This medicine is used to prevent chest pain caused by angina. It will not help to stop an episode of chest pain. This medicine may be used for other purposes; ask your health care provider or pharmacist if you have questions. COMMON BRAND NAME(S): Imdur, Isotrate ER What should I tell my health care provider before I take this medicine? They need to know if you have any of these conditions:  previous heart attack or heart failure  an unusual or allergic reaction to isosorbide mononitrate, nitrates, other medicines, foods, dyes, or preservatives  pregnant or trying to get pregnant  breast-feeding How should I use this medicine? Take this medicine by mouth with a glass of water. Follow the directions on the prescription label. Do not crush or chew. Take your medicine at regular intervals. Do not take your medicine more often than directed. Do not stop taking this medicine except on the advice of your doctor or health care professional. Talk to your pediatrician regarding the use of this medicine in children. Special care may be needed. Overdosage: If you think you  have taken too much of this medicine contact a poison control center or emergency room at once. NOTE: This medicine is only for you. Do not share this medicine with others. What if I miss a dose? If you miss a dose, take it as soon as you can. If it is almost time for your next dose, take only that dose. Do not take double or extra doses. What may interact with this medicine? Do not take this medicine with any of the following medications:  medicines used to treat erectile dysfunction (ED) like avanafil, sildenafil, tadalafil, and vardenafil  riociguat This medicine may also interact with the following medications:  medicines for high blood pressure  other medicines for angina or heart failure This list may not describe all possible interactions. Give your health care  provider a list of all the medicines, herbs, non-prescription drugs, or dietary supplements you use. Also tell them if you smoke, drink alcohol, or use illegal drugs. Some items may interact with your medicine. What should I watch for while using this medicine? Check your heart rate and blood pressure regularly while you are taking this medicine. Ask your doctor or health care professional what your heart rate and blood pressure should be and when you should contact him or her. Tell your doctor or health care professional if you feel your medicine is no longer working. You may get dizzy. Do not drive, use machinery, or do anything that needs mental alertness until you know how this medicine affects you. To reduce the risk of dizzy or fainting spells, do not sit or stand up quickly, especially if you are an older patient. Alcohol can make you more dizzy, and increase flushing and rapid heartbeats. Avoid alcoholic drinks. Do not treat yourself for coughs, colds, or pain while you are taking this medicine without asking your doctor or health care professional for advice. Some ingredients may increase your blood pressure. What side effects may I notice from receiving this medicine? Side effects that you should report to your doctor or health care professional as soon as possible:  bluish discoloration of lips, fingernails, or palms of hands  irregular heartbeat, palpitations  low blood pressure  nausea, vomiting  persistent headache  unusually weak or tired Side effects that usually do not require medical attention (report to your doctor or health care professional if they continue or are bothersome):  flushing of the face or neck  rash This list may not describe all possible side effects. Call your doctor for medical advice about side effects. You may report side effects to FDA at 1-800-FDA-1088. Where should I keep my medicine? Keep out of the reach of children. Store between 15 and 30  degrees C (59 and 86 degrees F). Keep container tightly closed. Throw away any unused medicine after the expiration date. NOTE: This sheet is a summary. It may not cover all possible information. If you have questions about this medicine, talk to your doctor, pharmacist, or health care provider.  2021 Elsevier/Gold Standard (2013-10-14 14:48:19)   Sleep Study, Adult A sleep study (polysomnogram) is a series of tests done while you are sleeping. A sleep study records your brain waves, heart rate, breathing rate, oxygen level, and eye and leg movements. A sleep study helps your health care provider:  See how well you sleep.  Diagnose a sleep disorder.  Determine how severe your sleep disorder is.  Create a plan to treat your sleep disorder. Your health care provider may recommend a sleep study  if you:  Feel sleepy on most days.  Snore loudly while sleeping.  Have unusual behaviors while you sleep, such as walking.  Have brief periods in which you stop breathing during sleep (sleepapnea).  Fall asleep suddenly during the day (narcolepsy).  Have trouble falling asleep or staying asleep (insomnia).  Feel like you need to move your legs when trying to fall asleep (restless legs syndrome).  Move your legs by flexing and extending them regularly while asleep (periodic limb movement disorder).  Act out your dreams while you sleep (sleep behavior disorder).  Feel like you cannot move when you first wake up (sleep paralysis). What tests are part of a sleep study? Most sleep studies record the following during sleep:  Brain activity.  Eye movements.  Heart rate and rhythm.  Breathing rate and rhythm.  Blood-oxygen level.  Blood pressure.  Chest and belly movement as you breathe.  Arm and leg movements.  Snoring or other noises.  Body position. Where are sleep studies done? Sleep studies are done at sleep centers. A sleep center may be inside a hospital, office, or  clinic. The room where you have the study may look like a hospital room or a hotel room. The health care providers doing the study may come in and out of the room during the study. Most of the time, they will be in another room monitoring your test as you sleep. How are sleep studies done? Most sleep studies are done during a normal period of time for a full night of sleep. You will arrive at the study center in the evening and go home in the morning. Before the test  Bring your pajamas and toothbrush with you to the sleep study.  Do not have caffeine on the day of your sleep study.  Do not drink alcohol on the day of your sleep study.  Your health care provider will let you know if you should stop taking any of your regular medicines before the test. During the test  Round, sticky patches with sensors attached to recording wires (electrodes) are placed on your scalp, face, chest, and limbs.  Wires from all the electrodes and sensors run from your bed to a computer. The wires can be taken off and put back on if you need to get out of bed to go to the bathroom.  A sensor is placed over your nose to measure airflow.  A finger clip is put on your finger or ear to measure your blood oxygen level (pulse oximetry).  A belt is placed around your belly and a belt is placed around your chest to measure breathing movements.  If you have signs of the sleep disorder called sleep apnea during your test, you may get a treatment mask to wear for the second half of the night. ? The mask provides positive airway pressure (PAP) to help you breathe better during sleep. This may greatly improve your sleep apnea. ? You will then have all tests done again with the mask in place to see if your measurements and recordings change.      After the test  A medical doctor who specializes in sleep will evaluate the results of your sleep study and share them with you and your primary health care provider.  Based  on your results, your medical history, and a physical exam, you may be diagnosed with a sleep disorder, such as: ? Sleep apnea. ? Restless legs syndrome. ? Sleep-related behavior disorder. ? Sleep-related movement disorders. ?  Sleep-related seizure disorders.  Your health care team will help determine your treatment options based on your diagnosis. This may include: ? Improving your sleep habits (sleep hygiene). ? Wearing a continuous positive airway pressure (CPAP) or bi-level positive airway pressure (BPAP) mask. ? Wearing an oral device at night to improve breathing and reduce snoring. ? Taking medicines. Follow these instructions at home:  Take over-the-counter and prescription medicines only as told by your health care provider.  If you are instructed to use a CPAP or BPAP mask, make sure you use it nightly as directed.  Make any lifestyle changes that your health care provider recommends.  If you were given a device to open your airway while you sleep, use it only as told by your health care provider.  Do not use any tobacco products, such as cigarettes, chewing tobacco, and e-cigarettes. If you need help quitting, ask your health care provider.  Keep all follow-up visits as told by your health care provider. This is important. Summary  A sleep study (polysomnogram) is a series of tests done while you are sleeping. It shows how well you sleep.  Most sleep studies are done over one full night of sleep. You will arrive at the study center in the evening and go home in the morning.  If you have signs of the sleep disorder called sleep apnea during your test, you may get a treatment mask to wear for the second half of the night.  A medical doctor who specializes in sleep will evaluate the results of your sleep study and share them with your primary health care provider. This information is not intended to replace advice given to you by your health care provider. Make sure you  discuss any questions you have with your health care provider. Document Revised: 01/20/2020 Document Reviewed: 01/12/2018 Elsevier Patient Education  2021 [place].     Adopting a Healthy Lifestyle.  Know what a healthy weight is for you (roughly BMI <25) and aim to maintain this   Aim for 7+ servings of fruits and vegetables daily   65-80+ fluid ounces of water or unsweet tea for healthy kidneys   Limit to max 1 drink of alcohol per day; avoid smoking/tobacco   Limit animal fats in diet for cholesterol and heart health - choose grass fed whenever available   Avoid highly processed foods, and foods high in saturated/trans fats   Aim for low stress - take time to unwind and care for your mental health   Aim for 150 min of moderate intensity exercise weekly for heart health, and weights twice weekly for bone health   Aim for 7-9 hours of sleep daily   When it comes to diets, agreement about the perfect plan isnt easy to find, even among the experts. Experts at the [employer] developed an idea known as the Healthy Eating Plate. Just imagine a plate divided into logical, healthy portions.   The emphasis is on diet quality:   Load up on vegetables and fruits - one-half of your plate: Aim for color and variety, and remember that potatoes dont count.   Go for whole grains - one-quarter of your plate: Whole wheat, barley, wheat berries, quinoa, oats, brown rice, and foods made with them. If you want pasta, go with whole wheat pasta.   Protein power - one-quarter of your plate: Fish, chicken, beans, and nuts are all healthy, versatile protein sources. Limit red meat.   The diet, however, does  go beyond the plate, offering a few other suggestions.   Use healthy plant oils, such as olive, canola, soy, corn, sunflower and peanut. Check the labels, and avoid partially hydrogenated oil, which have unhealthy trans fats.   If youre thirsty, drink water. Coffee  and tea are good in moderation, but skip sugary drinks and limit milk and dairy products to one or two daily servings.   The type of carbohydrate in the diet is more important than the amount. Some sources of carbohydrates, such as vegetables, fruits, whole grains, and beans-are healthier than others.   Finally, stay active  Signed, Berniece Salines, DO  01/17/2021 11:48 AM    [place]

## 2021-01-21 ENCOUNTER — Telehealth: Payer: Self-pay | Admitting: Emergency Medicine

## 2021-01-21 NOTE — Telephone Encounter (Signed)
Called to give patient results of labs. He reports that his heart rate is down to 54 today. He is feeling weak. Took monitor off yesterday and sending in today. This didn't happen when he was wearing monitor. He wanted to make Dr. Harriet Masson will send this message to her. He is aware to call us and let us know if anything changes or gets worse.

## 2021-01-22 ENCOUNTER — Telehealth: Payer: Self-pay | Admitting: Internal Medicine

## 2021-01-22 ENCOUNTER — Telehealth: Payer: Self-pay | Admitting: *Deleted

## 2021-01-22 ENCOUNTER — Telehealth: Payer: Self-pay

## 2021-01-22 ENCOUNTER — Other Ambulatory Visit: Payer: Self-pay | Admitting: Internal Medicine

## 2021-01-22 ENCOUNTER — Other Ambulatory Visit: Payer: Self-pay

## 2021-01-22 ENCOUNTER — Encounter: Payer: Self-pay | Admitting: Internal Medicine

## 2021-01-22 ENCOUNTER — Ambulatory Visit (INDEPENDENT_AMBULATORY_CARE_PROVIDER_SITE_OTHER): Payer: Medicare HMO | Admitting: Internal Medicine

## 2021-01-22 VITALS — BP 120/72 | HR 76 | Ht 72.0 in | Wt 207.0 lb

## 2021-01-22 DIAGNOSIS — E1142 Type 2 diabetes mellitus with diabetic polyneuropathy: Secondary | ICD-10-CM | POA: Diagnosis not present

## 2021-01-22 DIAGNOSIS — E119 Type 2 diabetes mellitus without complications: Secondary | ICD-10-CM

## 2021-01-22 DIAGNOSIS — E1165 Type 2 diabetes mellitus with hyperglycemia: Secondary | ICD-10-CM

## 2021-01-22 DIAGNOSIS — E11649 Type 2 diabetes mellitus with hypoglycemia without coma: Secondary | ICD-10-CM | POA: Insufficient documentation

## 2021-01-22 LAB — POCT GLUCOSE (DEVICE FOR HOME USE): POC Glucose: 149 mg/dl — AB (ref 70–99)

## 2021-01-22 LAB — POCT GLYCOSYLATED HEMOGLOBIN (HGB A1C): Hemoglobin A1C: 6.2 % — AB (ref 4.0–5.6)

## 2021-01-22 MED ORDER — GLIMEPIRIDE 2 MG PO TABS: 2.0000 mg | ORAL_TABLET | Freq: Every day | ORAL | 3 refills | Status: DC

## 2021-01-22 MED ORDER — ONETOUCH VERIO VI STRP: ORAL_STRIP | 12 refills | Status: DC

## 2021-01-22 MED FILL — ONE TOUCH VERIO TEST STRIP: 50 days supply | Qty: 50 | Fill #0

## 2021-01-22 MED FILL — GLIMEPIRIDE 2 MG TABLET: 2 | 90 days supply | Qty: 90 | Fill #0

## 2021-01-22 NOTE — Progress Notes (Signed)
Name: Ronald Petersen  Age/ Sex: 65 y.o., male   MRN/ DOB: [id number], 1956/04/29     PCP: [name], Alferd Apa, DO   Reason for Endocrinology Evaluation: Type 2 Diabetes Mellitus  Initial Endocrine Consultative Visit: 06/06/2020    PATIENT IDENTIFIER: Ronald Petersen is a 65 y.o. male with a past medical history of T2DM, OSA and Dyslipidemia . The patient has followed with Endocrinology clinic since 06/06/2020 for consultative assistance with management of his diabetes.  DIABETIC HISTORY:  Ronald Petersen was diagnosed with DM in 2008, has been on victoza, [place] and Januvia. His hemoglobin A1c has ranged from 6.1% in 2016, peaking at 9.7% in 2020.  No hx of pancreatitis     On his initial visit to our clinic, his A1c was 9.3% , he was on Metformin and Glimepiride. Which we increased.   SUBJECTIVE:   During the last visit (09/11/2020): A1c 7.3 % . We increased metformin and continued Glimepiride   Today (01/22/2021): Ronald Petersen is here for a follow up on diabetes.  He checks his blood sugars 1 times daily, he forgot his meter today. The patient has had hypoglycemic episodes since the last clinic visit. As low as 55 mg/dL around 2-3 pm , this occurs daily.  He is symptomatic with these episodes    Has lost 16 lbs since his last visit.  Has nausea all the time., used to get heartburn . He is on Omeprazole     HOME DIABETES REGIMEN:  Glimepiride 4 mg , 1 tablet before breakfast  Metformin 500 mg, TWO tablet with Breakfast and 1 with supper       Statin: Yes ACE-I/ARB: Yes    METER DOWNLOAD SUMMARY: Did not bring    DIABETIC COMPLICATIONS: Microvascular complications:   Neuropathy  Denies: CKD, Retinopathy  Last Eye Exam: Completed   Macrovascular complications:   Denies: CAD, CVA, PVD   HISTORY:  Past Medical History:  Past Medical History:  Diagnosis Date  . Anxiety   . Arthritis   . Cancer ([place])   . Complication of anesthesia    aspirated with  back surgery at age 32  . Depression   . Diabetes mellitus   . GERD (gastroesophageal reflux disease)   . Hyperlipidemia   . Hypertension   . Neuromuscular disorder ([place])    NEUROPATHY  . Right rotator cuff tear 11/23/2018  . Sleep apnea    no CPAP   Past Surgical History:  Past Surgical History:  Procedure Laterality Date  . APPENDECTOMY    . ARTHOSCOPIC ROTAOR CUFF REPAIR Right 11/23/2018   Procedure: ARTHROSCOPIC ROTATOR CUFF REPAIR;  Surgeon: [name], MD;  Location: [facility];  Service: Orthopedics;  Laterality: Right;  . CHOLECYSTECTOMY    . CORONARY STENT INTERVENTION N/A 11/19/2020   Procedure: CORONARY STENT INTERVENTION;  Surgeon: Nelva Bush, MD;  Location: [facility];  Service: Cardiovascular;  Laterality: N/A;  . INTRAVASCULAR ULTRASOUND/IVUS N/A 11/19/2020   Procedure: Intravascular Ultrasound/IVUS;  Surgeon: Nelva Bush, MD;  Location: [facility];  Service: Cardiovascular;  Laterality: N/A;  . LEFT HEART CATH AND CORONARY ANGIOGRAPHY N/A 11/19/2020   Procedure: LEFT HEART CATH AND CORONARY ANGIOGRAPHY;  Surgeon: Nelva Bush, MD;  Location: [facility];  Service: Cardiovascular;  Laterality: N/A;  . LEFT HEART CATH AND CORONARY ANGIOGRAPHY N/A 12/24/2020   Procedure: LEFT HEART CATH AND CORONARY ANGIOGRAPHY;  Surgeon: Martinique, Peter M, MD;  Location: [facility];  Service: Cardiovascular;  Laterality: N/A;  . LUMBAR LAMINECTOMY    . SHOULDER ARTHROSCOPY WITH ROTATOR CUFF REPAIR AND SUBACROMIAL DECOMPRESSION Right 11/23/2018   Procedure: SHOULDER ARTHROSCOPY WITH ROTATOR CUFF REPAIR AND SUBACROMIAL DECOMPRESSION;  Surgeon: Teryl Lucy, MD;  Location: [facility];  Service: Orthopedics;  Laterality: Right;    Social History:  reports that he quit smoking about 35 years ago. His smoking use included cigarettes. He quit after 16.00 years of use. He has never used smokeless tobacco. He reports  current alcohol use of about 4.0 - 5.0 standard drinks of alcohol per week. He reports that he does not use drugs. Family History:  Family History  Problem Relation Age of Onset  . Ovarian cancer Sister   . Stomach cancer Sister   . Heart disease Sister        MI  . Heart disease Sister        MI  . Alcohol abuse Other   . Depression Other   . Arthritis Other   . Hypertension Other   . Stomach cancer Maternal Grandmother   . COPD Mother   . Stroke Father   . Coronary artery disease Other   . Ovarian cancer Other        neice  . Ovarian cancer Other        neice  . Colon cancer Other      HOME MEDICATIONS: Allergies as of 01/22/2021      Reactions   Niacin Anaphylaxis      Medication List       Accurate as of January 22, 2021 11:46 AM. If you have any questions, ask your nurse or doctor.        aspirin EC 81 MG tablet Take 1 tablet (81 mg total) by mouth daily. Swallow whole.   Belsomra 10 MG Tabs Generic drug: Suvorexant Take 10 mg by mouth at bedtime as needed. May increase to two tablets (20 mg total) the next night if needed.   fenofibrate 160 MG tablet Take 1 tablet (160 mg total) by mouth daily. What changed: when to take this   gabapentin 800 MG tablet Commonly known as: Neurontin Take 1 tablet (800 mg total) by mouth 3 (three) times daily.   glimepiride 2 MG tablet Commonly known as: [name] Take 1 tablet (2 mg total) by mouth daily before breakfast. What changed:   medication strength  how much to take Changed by: Scarlette Shorts, MD   isosorbide mononitrate 30 MG 24 hr tablet Commonly known as: IMDUR Take 1 tablet (30 mg total) by mouth daily.   lisinopril 5 MG tablet Commonly known as: ZESTRIL Take 1 tablet (5 mg total) by mouth daily.   metFORMIN 500 MG 24 hr tablet Commonly known as: [name] Take 2 tablets (1,000 mg total) by mouth daily with breakfast AND 1 tablet (500 mg total) daily with supper.   nitroGLYCERIN 0.4 MG  SL tablet Commonly known as: NITROSTAT Place 1 tablet (0.4 mg total) under the tongue every 5 (five) minutes as needed for chest pain.   omeprazole 40 MG capsule Commonly known as: PRILOSEC 1 po bid What changed:   how much to take  how to take this  when to take this  additional instructions   ondansetron 4 MG tablet Commonly known as: ZOFRAN Take 1 tablet (4 mg total) by mouth every 8 (eight) hours as needed. What changed: reasons to take this   OneTouch Verio test strip Generic drug: glucose  blood Use as instructed   oxyCODONE-acetaminophen 5-325 MG tablet Commonly known as: PERCOCET/ROXICET TAKE 1 TABLET BY MOUTH EVERY 6 HOURS AS NEEDED   ranolazine 1000 MG SR tablet Commonly known as: Ranexa Take 1 tablet (1,000 mg total) by mouth 2 (two) times daily.   rosuvastatin 40 MG tablet Commonly known as: CRESTOR Take 1 tablet (40 mg total) by mouth daily.   sennosides-docusate sodium 8.6-50 MG tablet Commonly known as: SENOKOT-S Take 2 tablets by mouth daily. What changed:   how much to take  when to take this  reasons to take this   sertraline 100 MG tablet Commonly known as: ZOLOFT Take 1 tablet (100 mg total) by mouth daily. What changed: when to take this   ticagrelor 90 MG Tabs tablet Commonly known as: BRILINTA Take 1 tablet (90 mg total) by mouth 2 (two) times daily.   tiZANidine 4 MG tablet Commonly known as: ZANAFLEX TAKE 1 TABLET (4 MG TOTAL) BY MOUTH EVERY 6 (SIX) HOURS AS NEEDED FOR MUSCLE SPASMS.   traZODone 50 MG tablet Commonly known as: DESYREL Take 0.5-1 tablets (25-50 mg total) by mouth at bedtime as needed for sleep.        OBJECTIVE:   Vital Signs: BP 120/72   Pulse 76   Ht 6' (1.829 m)   Wt 207 lb (93.9 kg)   SpO2 97%   BMI 28.07 kg/m   Wt Readings from Last 3 Encounters:  01/22/21 207 lb (93.9 kg)  01/17/21 210 lb (95.3 kg)  12/24/20 212 lb 15.4 oz (96.6 kg)     Exam: General: Pt appears well and is in NAD   Neck: General: Supple without adenopathy. Thyroid: Thyroid size normal.  No goiter or nodules appreciated.  Lungs: Clear with good BS bilat with no rales, rhonchi, or wheezes  Heart: RRR with normal S1 and S2 and no gallops; no murmurs; no rub  Abdomen: Normoactive bowel sounds, soft, nontender, without masses or organomegaly palpable  Extremities: No pretibial edema.  Neuro: MS is good with appropriate affect, pt is alert and Ox3       DM Foot Exam 09/11/2020 The skin of the feet is without sores or ulcerations but with thickened discolored nail  The pedal pulses are 2+ on right and 2+ on left. The sensation is intact to a screening 5.07, 10 gram monofilament bilaterally     DATA REVIEWED:  Lab Results  Component Value Date   HGBA1C 6.2 (A) 01/22/2021   HGBA1C 7.2 (H) 11/19/2020   HGBA1C 7.3 (H) 09/06/2020   Lab Results  Component Value Date   MICROALBUR 1.7 05/31/2020   LDLCALC 96 09/06/2020   CREATININE 1.09 01/17/2021   Lab Results  Component Value Date   MICRALBCREAT 1.9 05/31/2020     Lab Results  Component Value Date   CHOL 157 09/06/2020   HDL 34 (L) 09/06/2020   LDLCALC 96 09/06/2020   LDLDIRECT 93.0 05/31/2020   TRIG 174 (H) 09/06/2020   CHOLHDL 4.6 09/06/2020         ASSESSMENT / PLAN / RECOMMENDATIONS:   1) Type 2 Diabetes Mellitus, with recurrent hypoglycemia, With Neuropathic complications - Most recent A1c of 6.2 %. Goal A1c < 7.0 %.    - I have praised him on weight loss, and lifestyle changes but unfortunately he is having hypoglycemic episodes in the afternoon, will reduce Glimepiride dose as below    MEDICATIONS:  - Decrease Glimepiride to 2 mg , 1 tablet before breakfast  -  Continue Metformin 500 mg, TWO tablet with Breakfast and ONE tablet with supper   EDUCATION / INSTRUCTIONS:  BG monitoring instructions: Patient is instructed to check his blood sugars 1 times a day  Call [facility] if: BG persistently < 70  . I  reviewed the Rule of 15 for the treatment of hypoglycemia in detail with the patient. Literature supplied.   2) Diabetic complications:   Eye: Does not have known diabetic retinopathy.   Neuro/ Feet: Does have known diabetic peripheral neuropathy .   Renal: Patient does not have known baseline CKD. He   is on an ACEI/ARB at present.     F/U in 6 months    Signed electronically by: Mack Guise, MD  [facility]  [facility] [address] [place], [place] [address] Phone: [phone] FAX: [phone]   CC: [address] [place] [address] Phone: [phone]  Fax: [phone]  Return to Endocrinology clinic as below: Future Appointments  Date Time Provider [place]  02/14/2021 11:00 AM Tobb, Godfrey Pick, DO [facility] None  03/05/2021 11:00 AM Tobb, Kardie, DO [facility] None  03/07/2021 11:00 AM [name], DO [facility]  07/30/2021 10:50 AM Phuong Hillary, Melanie Crazier, MD [facility]

## 2021-01-22 NOTE — Telephone Encounter (Signed)
Pharmancy called in reference to one touch vero  insulin . Per pharmacy they need to have instructions on how many time the patient needs to take medication or the max dose (daily)   Please advise for insurance purposes

## 2021-01-22 NOTE — Patient Instructions (Addendum)
-   Keep Up the Good Work !! -  Decrease Glimepiride to 2 mg , 1 tablet before breakfast  - Continue Metformin 500 mg, TWO tablet with Breakfast and ONE tablet with supper      HOW TO TREAT LOW BLOOD SUGARS (Blood sugar LESS THAN 70 MG/DL)  Please follow the RULE OF 15 for the treatment of hypoglycemia treatment (when your (blood sugars are less than 70 mg/dL)    STEP 1: Take 15 grams of carbohydrates when your blood sugar is low, which includes:   3-4 GLUCOSE TABS  OR  3-4 OZ OF JUICE OR REGULAR SODA OR  ONE TUBE OF GLUCOSE GEL     STEP 2: RECHECK blood sugar in 15 MINUTES STEP 3: If your blood sugar is still low at the 15 minute recheck --> then, go back to STEP 1 and treat AGAIN with another 15 grams of carbohydrates.

## 2021-01-22 NOTE — Telephone Encounter (Signed)
medication is causing him extreme fatigue and headache.  He did not take today and is feeling better.  Would like advise going forward.

## 2021-01-22 NOTE — Telephone Encounter (Signed)
Spoken to the pharmacy and gave the direction for Rx

## 2021-01-22 NOTE — Telephone Encounter (Signed)
Staff message sent to [facility] ok to schedule sleep study. Holli Humbles received as follows. [id number]. Valid dates 01/22/21 to 07/21/21.

## 2021-01-22 NOTE — Telephone Encounter (Signed)
-----   Message from Senaida Ores, RN sent at 01/17/2021  2:20 PM EST ----- Regarding: Please precert Please precert sleep study for sleep apnea.

## 2021-01-22 NOTE — Telephone Encounter (Signed)
-----   Message from [place] sent at 01/22/2021  1:17 PM EST ----- Regarding: RE: Please precert Ronald Petersen received. Ok to schedule sleep study. Auth# [id number]. Valid dates 01/22/21 to 07/21/21. Call sleep lab @ [phone] to schedule. ----- Message ----- From: Senaida Ores, RN Sent: 01/17/2021   2:20 PM EST To: [facility] Sleep Studies Subject: Please precert                                 Please precert sleep study for sleep apnea.

## 2021-01-22 NOTE — Telephone Encounter (Signed)
Patient notified of approval and appt with sleep study on 03/02/21 at 8pm through My chart.

## 2021-01-23 ENCOUNTER — Other Ambulatory Visit: Payer: Self-pay | Admitting: Family Medicine

## 2021-01-23 DIAGNOSIS — G8929 Other chronic pain: Secondary | ICD-10-CM

## 2021-01-23 MED FILL — OXYCODONE-ACETAMINOPHEN 5-3: 5-325 | 23 days supply | Qty: 90 | Fill #0

## 2021-01-23 NOTE — Telephone Encounter (Signed)
Pt has not been seen by [place] since September ----- overdue for ov 1/25 was with endo  I will refill 1 mont h  but he needs ov to update contract and uds

## 2021-01-23 NOTE — Telephone Encounter (Signed)
Pt has a 6 month follow up pending in March- I added a note on the appointment line to update csc and uds. Pt voices understanding to keep that appointment.

## 2021-01-23 NOTE — Telephone Encounter (Signed)
Requesting: Percocet Contract: 08/03/18 UDS: 11/14/19 Last Visit: 01/22/2021 Next Visit: 03/07/2021 Last Refill: 01/03/2021  Please Advise

## 2021-01-25 LAB — VITAMIN D 1,25 DIHYDROXY
Vitamin D 1, 25 (OH)2 Total: 42 pg/mL
Vitamin D2 1, 25 (OH)2: <10 pg/mL
Vitamin D3 1, 25 (OH)2: 42 pg/mL

## 2021-01-25 LAB — BASIC METABOLIC PANEL
BUN/Creatinine Ratio: 20 (ref 10–24)
BUN: 22 mg/dL (ref 8–27)
CO2: 25 mmol/L (ref 20–29)
Calcium: 10 mg/dL (ref 8.6–10.2)
Chloride: 97 mmol/L (ref 96–106)
Creatinine, Ser: 1.09 mg/dL (ref 0.76–1.27)
GFR calc Af Amer: 82 mL/min/{1.73_m2} (ref >59)
GFR calc non Af Amer: 71 mL/min/{1.73_m2} (ref >59)
Glucose: 108 mg/dL — ABNORMAL HIGH (ref 65–99)
Potassium: 4.9 mmol/L (ref 3.5–5.2)
Sodium: 136 mmol/L (ref 134–144)

## 2021-01-25 LAB — MAGNESIUM: Magnesium: 1.9 mg/dL (ref 1.6–2.3)

## 2021-01-25 MED FILL — BRILINTA 90 MG TABLET: 90 | 30 days supply | Qty: 60 | Fill #0

## 2021-01-29 DIAGNOSIS — I2510 Atherosclerotic heart disease of native coronary artery without angina pectoris: Secondary | ICD-10-CM | POA: Diagnosis not present

## 2021-01-29 DIAGNOSIS — E785 Hyperlipidemia, unspecified: Secondary | ICD-10-CM | POA: Diagnosis not present

## 2021-01-29 DIAGNOSIS — G4730 Sleep apnea, unspecified: Secondary | ICD-10-CM | POA: Diagnosis not present

## 2021-01-29 MED FILL — metFORMIN HCL ER 500 MG TB2: 500 | 90 days supply | Qty: 270 | Fill #1

## 2021-01-29 MED FILL — SERTRALINE HCL 100 MG TABS: 100 | 90 days supply | Qty: 90 | Fill #0

## 2021-01-30 ENCOUNTER — Other Ambulatory Visit: Payer: Self-pay | Admitting: Pharmacist Clinician (PhC)/ Clinical Pharmacy Specialist

## 2021-01-30 DIAGNOSIS — E785 Hyperlipidemia, unspecified: Secondary | ICD-10-CM

## 2021-02-01 ENCOUNTER — Telehealth: Payer: Self-pay | Admitting: Neurology

## 2021-02-01 ENCOUNTER — Telehealth: Payer: Self-pay | Admitting: Cardiology

## 2021-02-01 NOTE — Telephone Encounter (Signed)
Called Patient and informed him that Dr. Tomi Likens has not sent any MRI order and that it was Dr. Lorin Mercy. Informed patient that he needs to contact the ordering information. Patient stated that he will do that. Patient had no other questions or concerns.

## 2021-02-01 NOTE — Telephone Encounter (Signed)
Patient called in questioning an MRI on 02/07/21. He cannot find where he is supposed to be having one?

## 2021-02-01 NOTE — Telephone Encounter (Signed)
Patient would like to know if he needs to have an MRI soon. He states he discussed having an MRI with his pcp.

## 2021-02-01 NOTE — Telephone Encounter (Signed)
Explained to the pt Dr. Harriet Masson is out of the office today and that I sent her a message to clarify it pt needs a MRI.

## 2021-02-04 ENCOUNTER — Telehealth: Payer: Self-pay

## 2021-02-04 ENCOUNTER — Ambulatory Visit: Payer: Medicare HMO | Admitting: Neurology

## 2021-02-04 NOTE — Telephone Encounter (Signed)
Called and lmomed pt to see if they were approved for brillinta pt assistance as I was on medical leave.

## 2021-02-05 ENCOUNTER — Other Ambulatory Visit: Payer: Self-pay

## 2021-02-05 ENCOUNTER — Ambulatory Visit (INDEPENDENT_AMBULATORY_CARE_PROVIDER_SITE_OTHER): Payer: Medicare HMO | Admitting: Surgery

## 2021-02-05 ENCOUNTER — Encounter: Payer: Self-pay | Admitting: Surgery

## 2021-02-05 ENCOUNTER — Ambulatory Visit: Payer: Self-pay

## 2021-02-05 VITALS — BP 124/67 | HR 62 | Ht 72.0 in | Wt 199.0 lb

## 2021-02-05 DIAGNOSIS — M542 Cervicalgia: Secondary | ICD-10-CM | POA: Diagnosis not present

## 2021-02-05 DIAGNOSIS — M4722 Other spondylosis with radiculopathy, cervical region: Secondary | ICD-10-CM | POA: Diagnosis not present

## 2021-02-05 NOTE — Progress Notes (Signed)
Office Visit Note   Patient: Ronald Petersen           Date of Birth: [birth year]           MRN: [id number] Visit Date: 02/05/2021              Requested by: [address] [place], [address] [place],  [place] [address] PCP: [name], Alferd Apa, DO   Assessment & Plan: Visit Diagnoses:  1. Neck pain   2. Other spondylosis with radiculopathy, cervical region     Plan: With patient's ongoing symptoms that are progressively getting worse I will repeat cervical spine MRI and compare to the study that was done in 2020.  Follow with Dr. Lorin Mercy for recheck to discuss results.  Patient states that he is ready to proceed with surgery that was previously discussed with Dr. Lorin Mercy.  I reviewed x-ray with him and I advised that I am wondering whether or not he will need a two-level fusion with obviously will wait to see what the MRI looks like.  We also would need preop medical and cardiac clearances.  Follow-Up Instructions: Return in about 3 weeks (around 02/26/2021) for With Dr. Lorin Mercy to review cervical MRI.   Orders:  Orders Placed This Encounter  Procedures  . XR Cervical Spine 2 or 3 views  . MR Cervical Spine w/o contrast   No orders of the defined types were placed in this encounter.     Procedures: No procedures performed   Clinical Data: No additional findings.   Subjective: Chief Complaint  Patient presents with  . Neck - Pain    HPI 65 year old white male with history of neck pain and bilateral upper extremity radiculopathy returns.  Patient has known history of cervical spondylosis.  Had cervical spine MRI performed July 24, 2019 and study at that time showed:  EXAM: MRI CERVICAL SPINE WITHOUT CONTRAST  TECHNIQUE: Multiplanar, multisequence MR imaging of the cervical spine was performed. No intravenous contrast was administered.  COMPARISON:  Cervical spine MRI 11/10/2015  FINDINGS: Alignment: Physiologic.  Vertebrae: No fracture,  evidence of discitis, or bone lesion.  Cord: Normal signal and morphology.  Posterior Fossa, vertebral arteries, paraspinal tissues: Visualized posterior fossa is normal. Vertebral artery flow voids are preserved. No prevertebral soft tissue swelling.  Disc levels:  C2-3: Normal.  C3-4: Left subarticular disc protrusion, unchanged. No spinal canal or neural foraminal stenosis.  C4-5: Normal.  C5-6: Unchanged intermediate sized left subarticular disc osteophyte complex. Severe left neural foraminal stenosis. No spinal canal or right neural foraminal stenosis.  C6-7: Small disc bulge without spinal canal or neural foraminal stenosis.  C7-T1: No spinal canal or neural foraminal stenosis.  IMPRESSION: Unchanged C5-6 left subarticular disc osteophyte complex with severe left neural foraminal stenosis.   Electronically Signed   By: Ulyses Jarred M.D.   On: 07/24/2019 00:47  Dr. Lorin Mercy I discussed with him doing a C5-6 ACDF.  Patient states that the last time this was mentioned there was some issues with his insurance and was unable to do so.  His symptoms have progressively gotten worse and states that he is ready to proceed with surgery now if this is recommended.  States that pain radiates from the neck to the bilateral trapezius and shoulder blades.  Pain also radiates down his arms.  He recently had cardiac stents placed.  He has not started formal cardiac rehab.  Currently is taking gabapentin and oxycodone.  Review of  Systems No current cardiac pulmonary GI GU issues  Objective: Vital Signs: BP 124/67   Pulse 62   Ht 6' (1.829 m)   Wt 199 lb (90.3 kg)   BMI 26.99 kg/m   Physical Exam HENT:     Head: Normocephalic.     Nose: Nose normal.  Neck:     Comments: Bilateral brachial plexus trapezius and scapular tenderness. Pulmonary:     Effort: No respiratory distress.  Musculoskeletal:     Comments: Right shoulder exam unremarkable.   trace right biceps  and triceps weakness.  Neurological:     Mental Status: He is alert and oriented to person, place, and time.     Ortho Exam  Specialty Comments:  No specialty comments available.  Imaging: No results found.   PMFS History: Patient Active Problem List   Diagnosis Date Noted  . Type 2 diabetes mellitus with hyperglycemia, without long-term current use of insulin ([place]) 01/22/2021  . Type 2 diabetes mellitus with diabetic polyneuropathy, without long-term current use of insulin ([place]) 01/22/2021  . Metabolic syndrome 76/28/3151  . Coronary artery disease 01/17/2021  . Anxiety   . Arthritis   . Cancer ([place])   . Complication of anesthesia   . GERD (gastroesophageal reflux disease)   . Hyperlipidemia   . Hypertension   . Neuromuscular disorder ([place])   . Sleep apnea   . Coronary artery disease involving native coronary artery of native heart with angina pectoris ([place]) 11/20/2020  . CAD S/P DES PCI LAD & LCx-OM 11/20/2020  . Unstable angina ([place]) 11/19/2020  . Abnormal cardiac CT angiography 11/19/2020  . Shortness of breath 10/02/2020  . Dizziness 10/02/2020  . Chest pain 10/02/2020  . Obesity (BMI 30-39.9) 10/02/2020  . Type 2 diabetes mellitus with diabetic neuropathy, without long-term current use of insulin ([place]) 06/06/2020  . Uncontrolled type 2 diabetes mellitus with hyperglycemia ([place]) 05/31/2020  . Blurry vision 02/07/2020  . Spinal stenosis of lumbar region 02/07/2020  . Frequent falls 02/07/2020  . Foraminal stenosis of cervical region 07/26/2019  . Aortic atherosclerosis ([place]) 05/27/2019  . Chronic bilateral low back pain without sciatica 05/20/2019  . Carpal tunnel syndrome, bilateral 05/20/2019  . Other spondylosis with radiculopathy, cervical region 05/20/2019  . Leg weakness, bilateral 01/05/2019  . Falling episodes 01/05/2019  . Right rotator cuff tear 11/23/2018  . Neck pain 11/04/2018  . Acute pain of right shoulder 11/04/2018  . Gastroesophageal reflux  disease 11/04/2018  . Hyperlipidemia associated with type 2 diabetes mellitus ([place]) 11/04/2018  . 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 Essential hypertension 09/14/2017  . Preventative health care 06/12/2016  . OSA (obstructive sleep apnea) 04/07/2016  . Joint pain 04/05/2016  . NSAID long-term use 12/13/2015  . Nausea without vomiting 12/13/2015  . History of colonic polyps 12/13/2015  . Loss of weight 12/13/2015  . Depression 11/06/2015  . Metatarsal deformity 02/20/2015  . Equinus deformity of foot, acquired 02/20/2015  . Plantar fasciitis, bilateral 02/15/2015  . Left wrist injury 06/15/2014  . Peripheral neuropathy 04/01/2012  . Fatigue 11/05/2010  . OTHER SPECIFIED DISORDER OF PENIS 03/26/2010  . NAUSEA 03/26/2010  . Pain in limb 10/17/2008  . NECK PAIN, CHRONIC 11/24/2007  . DM (diabetes mellitus) type II uncontrolled, periph vascular disorder ([place]) 01/27/2007  . Hyperlipidemia LDL goal <70 01/27/2007   Past Medical History:  Diagnosis Date  . Anxiety   . Arthritis   . Cancer ([place])   .  Complication of anesthesia    aspirated with back surgery at age 56  . Depression   . Diabetes mellitus   . GERD (gastroesophageal reflux disease)   . Hyperlipidemia   . Hypertension   . Neuromuscular disorder ([place])    NEUROPATHY  . Right rotator cuff tear 11/23/2018  . Sleep apnea    no CPAP    Family History  Problem Relation Age of Onset  . Ovarian cancer Sister   . Stomach cancer Sister   . Heart disease Sister        MI  . Heart disease Sister        MI  . Alcohol abuse Other   . Depression Other   . Arthritis Other   . Hypertension Other   . Stomach cancer Maternal Grandmother   . COPD Mother   . Stroke Father   . Coronary artery disease Other   . Ovarian cancer Other        neice  . Ovarian cancer Other        neice  . Colon cancer Other     Past Surgical History:  Procedure Laterality Date  . APPENDECTOMY    .  ARTHOSCOPIC ROTAOR CUFF REPAIR Right 11/23/2018   Procedure: ARTHROSCOPIC ROTATOR CUFF REPAIR;  Surgeon: [name], MD;  Location: [facility];  Service: Orthopedics;  Laterality: Right;  . CHOLECYSTECTOMY    . CORONARY STENT INTERVENTION N/A 11/19/2020   Procedure: CORONARY STENT INTERVENTION;  Surgeon: Nelva Bush, MD;  Location: [facility];  Service: Cardiovascular;  Laterality: N/A;  . INTRAVASCULAR ULTRASOUND/IVUS N/A 11/19/2020   Procedure: Intravascular Ultrasound/IVUS;  Surgeon: Nelva Bush, MD;  Location: [facility];  Service: Cardiovascular;  Laterality: N/A;  . LEFT HEART CATH AND CORONARY ANGIOGRAPHY N/A 11/19/2020   Procedure: LEFT HEART CATH AND CORONARY ANGIOGRAPHY;  Surgeon: Nelva Bush, MD;  Location: [facility];  Service: Cardiovascular;  Laterality: N/A;  . LEFT HEART CATH AND CORONARY ANGIOGRAPHY N/A 12/24/2020   Procedure: LEFT HEART CATH AND CORONARY ANGIOGRAPHY;  Surgeon: Martinique, Peter M, MD;  Location: [facility];  Service: Cardiovascular;  Laterality: N/A;  . LUMBAR LAMINECTOMY    . SHOULDER ARTHROSCOPY WITH ROTATOR CUFF REPAIR AND SUBACROMIAL DECOMPRESSION Right 11/23/2018   Procedure: SHOULDER ARTHROSCOPY WITH ROTATOR CUFF REPAIR AND SUBACROMIAL DECOMPRESSION;  Surgeon: [name], MD;  Location: [facility];  Service: Orthopedics;  Laterality: Right;   Social History   Occupational History  . Occupation: self employed    Fish farm manager: UNEMPLOYED  Tobacco Use  . Smoking status: Former Smoker    Years: 16.00    Types: Cigarettes    Quit date: 12/29/1985    Years since quitting: 35.1  . Smokeless tobacco: Never Used  Vaping Use  . Vaping Use: Never used  Substance and Sexual Activity  . Alcohol use: Yes    Alcohol/week: 4.0 - 5.0 standard drinks    Types: 4 - 5 Cans of beer per week    Comment: 5 times per week  . Drug use: No  . Sexual activity: Yes    Partners: Female

## 2021-02-06 NOTE — Telephone Encounter (Signed)
Patient is returning call. I informed him that the Brilinta samples are available at [facility] office per previous message. He states he is unable to pick them up today, but he plans to have his son, Legrand Como, or his daughter, Alyse Low, pick them up tomorrow. No further questions.

## 2021-02-06 NOTE — Telephone Encounter (Signed)
Called and lmomed that we will place samples at the [facility] office for brillinta

## 2021-02-06 NOTE — Telephone Encounter (Signed)
Called and lmomed the pt that they had missed infor on the form and that is why the application to receive the brillinta free from the manufacturer wasn't approved

## 2021-02-08 ENCOUNTER — Telehealth: Payer: Self-pay | Admitting: Family Medicine

## 2021-02-08 NOTE — Telephone Encounter (Signed)
Did they get the vaccine?  If not--- they can be referred to covid tx center

## 2021-02-08 NOTE — Telephone Encounter (Signed)
Patient called to advice Dr.Lowne that him and his wife has tested positive for covid

## 2021-02-08 NOTE — Telephone Encounter (Signed)
FYI

## 2021-02-12 ENCOUNTER — Other Ambulatory Visit: Payer: Medicare HMO

## 2021-02-12 NOTE — Progress Notes (Deleted)
NEUROLOGY FOLLOW UP OFFICE NOTE  Ronald Petersen [id number]   Subjective:  Ronald Petersen is a 65 year old man who presents for lower extremity weakness.  History supplemented by referring provider notes.  Patient has history of chronic neck and back pain with history of lumbar laminectomy.  He is on multiple medications for chronic pain.  He reports radicular pain down the back of his legs when he walks.  Lumbar spine X-ray from 01/04/19 personally reviewed and demonstrated multilevel arthritic changes with moderate disc space narrowing at L4-5 and L5-S1 as well as facet arthritic changes at L3-4, L4-5 and L5-S1 bilaterally and anterior osteophytes at L4, L5, and S1.  He also reports worsening neck pain as well, radiating down both sides and into his shoulders.  Neck pain is followed by Dr. Lorin Mercy.  Due to chronic neck pain, cervical X-ray from 05/20/19 showed spondylosis most prominent at C5-6 and C6-7.  Since 2019, he reports worsening weakness and pain in the legs.  He also reports weakness in the upper extremities as well.  He has significant numbness in the legs as well as in the hands radiating up the arms.  He was diagnosed with severe carpal tunnel syndrome.  Labs from February 2020 include B12 342 and low B1 of 7.  He has a history of alcohol dependence and reports that he was drinking about 6 beers every other day up until early 2020.  Hgb A1c from 05/20/19 was elevated at 9.7.  MRI of cervical spine on 07/24/2019 showed unchanged C5-6 left subarticular disc osteophyte complex with severe left neural foraminal stenosis.  At that time, there was discussion about performing a C5-6 ACDF but there reportedly was an issue with insurance.  Since then, he reports *** have gotten worse.  He had cardiac stents placed ***.  Since then, he reports worsening bilateral lower extremity weakness.  Orthopedics have ordered a repeat MRI of cervical spine which is scheduled for ***  PAST MEDICAL HISTORY: Past  Medical History:  Diagnosis Date  . Anxiety   . Arthritis   . Cancer ([place])   . Complication of anesthesia    aspirated with back surgery at age 89  . Depression   . Diabetes mellitus   . GERD (gastroesophageal reflux disease)   . Hyperlipidemia   . Hypertension   . Neuromuscular disorder ([place])    NEUROPATHY  . Right rotator cuff tear 11/23/2018  . Sleep apnea    no CPAP    MEDICATIONS: Current Outpatient Medications on File Prior to Visit  Medication Sig Dispense Refill  . aspirin EC 81 MG tablet Take 1 tablet (81 mg total) by mouth daily. Swallow whole. 90 tablet 3  . fenofibrate 160 MG tablet Take 1 tablet (160 mg total) by mouth daily. (Patient taking differently: Take 160 mg by mouth daily at 12 noon.) 90 tablet 1  . gabapentin (NEURONTIN) 800 MG tablet Take 1 tablet (800 mg total) by mouth 3 (three) times daily. 270 tablet 3  . glimepiride ([name]) 2 MG tablet Take 1 tablet (2 mg total) by mouth daily before breakfast. 90 tablet 3  . glucose blood (ONETOUCH VERIO) test strip Use as instructed 100 each 12  . isosorbide mononitrate (IMDUR) 30 MG 24 hr tablet Take 1 tablet (30 mg total) by mouth daily. 90 tablet 1  . lisinopril (ZESTRIL) 5 MG tablet Take 1 tablet (5 mg total) by mouth daily. 90 tablet 1  . metFORMIN ([name]) 500 MG 24 hr  tablet Take 2 tablets (1,000 mg total) by mouth daily with breakfast AND 1 tablet (500 mg total) daily with supper. 270 tablet 3  . nitroGLYCERIN (NITROSTAT) 0.4 MG SL tablet Place 1 tablet (0.4 mg total) under the tongue every 5 (five) minutes as needed for chest pain. 90 tablet 3  . omeprazole (PRILOSEC) 40 MG capsule 1 po bid (Patient taking differently: Take 80 mg by mouth at bedtime.) 180 capsule 3  . ondansetron (ZOFRAN) 4 MG tablet Take 1 tablet (4 mg total) by mouth every 8 (eight) hours as needed. (Patient taking differently: Take 4 mg by mouth every 8 (eight) hours as needed for nausea or vomiting.) 20 tablet 2  .  oxyCODONE-acetaminophen (PERCOCET/ROXICET) 5-325 MG tablet TAKE 1 TABLET BY MOUTH EVERY 6 HOURS AS NEEDED 90 tablet 0  . ranolazine (RANEXA) 1000 MG SR tablet Take 1 tablet (1,000 mg total) by mouth 2 (two) times daily. 60 tablet 3  . rosuvastatin (CRESTOR) 40 MG tablet Take 1 tablet (40 mg total) by mouth daily. 90 tablet 3  . sennosides-docusate sodium (SENOKOT-S) 8.6-50 MG tablet Take 2 tablets by mouth daily. (Patient taking differently: Take 1 tablet by mouth daily as needed for constipation.) 30 tablet 1  . sertraline (ZOLOFT) 100 MG tablet Take 1 tablet (100 mg total) by mouth daily. (Patient taking differently: Take 100 mg by mouth at bedtime.) 90 tablet 1  . Suvorexant (BELSOMRA) 10 MG TABS Take 10 mg by mouth at bedtime as needed. May increase to two tablets (20 mg total) the next night if needed. 30 tablet 0  . ticagrelor (BRILINTA) 90 MG TABS tablet Take 1 tablet (90 mg total) by mouth 2 (two) times daily. 180 tablet 1  . tiZANidine (ZANAFLEX) 4 MG tablet TAKE 1 TABLET (4 MG TOTAL) BY MOUTH EVERY 6 (SIX) HOURS AS NEEDED FOR MUSCLE SPASMS. 30 tablet 1  . traZODone (DESYREL) 50 MG tablet Take 0.5-1 tablets (25-50 mg total) by mouth at bedtime as needed for sleep. 30 tablet 3   No current facility-administered medications on file prior to visit.    ALLERGIES: Allergies  Allergen Reactions  . Niacin Anaphylaxis    FAMILY HISTORY: Family History  Problem Relation Age of Onset  . Ovarian cancer Sister   . Stomach cancer Sister   . Heart disease Sister        MI  . Heart disease Sister        MI  . Alcohol abuse Other   . Depression Other   . Arthritis Other   . Hypertension Other   . Stomach cancer Maternal Grandmother   . COPD Mother   . Stroke Father   . Coronary artery disease Other   . Ovarian cancer Other        neice  . Ovarian cancer Other        neice  . Colon cancer Other    ***.  SOCIAL HISTORY: Social History   Socioeconomic History  . Marital status:  Married    Spouse name: Lorriane Shire  . Number of children: 2  . Years of education: Not on file  . Highest education level: Not on file  Occupational History  . Occupation: self employed    Fish farm manager: UNEMPLOYED  Tobacco Use  . Smoking status: Former Smoker    Years: 16.00    Types: Cigarettes    Quit date: 12/29/1985    Years since quitting: 35.1  . Smokeless tobacco: Never Used  Vaping Use  . Vaping  Use: Never used  Substance and Sexual Activity  . Alcohol use: Yes    Alcohol/week: 4.0 - 5.0 standard drinks    Types: 4 - 5 Cans of beer per week    Comment: 5 times per week  . Drug use: No  . Sexual activity: Yes    Partners: Female  Other Topics Concern  . Not on file  Social History Narrative   Exercise-- 3 days   Pt has hs degree   Social Determinants of Health   Financial Resource Strain: Not on file  Food Insecurity: Not on file  Transportation Needs: Not on file  Physical Activity: Not on file  Stress: Not on file  Social Connections: Not on file  Intimate Partner Violence: Not on file     Objective:  *** General: No acute distress.  Patient appears ***-groomed.   Head:  Normocephalic/atraumatic Eyes:  Fundi examined but not visualized Neck: supple, no paraspinal tenderness, full range of motion Heart:  Regular rate and rhythm Lungs:  Clear to auscultation bilaterally Back: No paraspinal tenderness Neurological Exam: alert and oriented to person, place, and time. Attention span and concentration intact, recent and remote memory intact, fund of knowledge intact.  Speech fluent and not dysarthric, language intact.  CN II-XII intact. Bulk and tone normal, muscle strength 5/5 throughout.  Sensation to light touch, temperature and vibration intact.  Deep tendon reflexes 2+ throughout, toes downgoing.  Finger to nose and heel to shin testing intact.  Gait normal, Romberg negative.   Assessment/Plan:   ***  Metta Clines, DO  CC: ***

## 2021-02-13 ENCOUNTER — Ambulatory Visit: Payer: Medicare HMO | Admitting: Neurology

## 2021-02-14 ENCOUNTER — Ambulatory Visit: Payer: Medicare HMO | Admitting: Cardiology

## 2021-02-14 ENCOUNTER — Telehealth: Payer: Self-pay

## 2021-02-14 MED FILL — FENOFIBRATE 160 MG TABLET: 160 | 90 days supply | Qty: 90 | Fill #1

## 2021-02-14 MED FILL — ONDANSETRON HCL 4 MG TABLET: 4 | 7 days supply | Qty: 20 | Fill #2

## 2021-02-14 MED FILL — traZODone HCL 50 MG TABS: 50 | 30 days supply | Qty: 30 | Fill #1

## 2021-02-14 MED FILL — RANOLAZINE ER 1000 MG TB12: 1000 | 30 days supply | Qty: 60 | Fill #1

## 2021-02-14 NOTE — Telephone Encounter (Signed)
-----   Message from [place], [place] sent at 02/13/2021  2:10 PM EST ----- Regarding: cancel appointments Has covid,  tested positive (02/06/21) please reschedule pt's appt as he does not wish to come. Called him to get pt's brillinta assistance form completed and he provided me with this information

## 2021-02-18 ENCOUNTER — Other Ambulatory Visit: Payer: Self-pay | Admitting: Family Medicine

## 2021-02-18 DIAGNOSIS — G8929 Other chronic pain: Secondary | ICD-10-CM

## 2021-02-18 NOTE — Telephone Encounter (Signed)
Will need contract and uds at next visit

## 2021-02-18 NOTE — Telephone Encounter (Signed)
Requesting:Oxycodone  Contract:None  HBZ:JIRC Last Visit: 01/22/21 Next Visit:03/07/21 Last Refill:12/24/20  Please Advise

## 2021-02-19 MED FILL — OXYCODONE-ACETAMINOPHEN 5-3: 5-325 | 23 days supply | Qty: 90 | Fill #0

## 2021-02-20 NOTE — Telephone Encounter (Signed)
Appointment note updated for next OV.

## 2021-02-25 NOTE — Progress Notes (Signed)
NEUROLOGY FOLLOW UP OFFICE NOTE  Ronald Petersen [id number]  Assessment/Plan:   1.  Neurocognitive disorder - progressed.  May be sequelae of vascular risk factors, untreated OSA, long-term opioid use and history of alcohol consumption but underlying neurodegenerative disorder cannot be ruled out. 2.  HTN 3.  Type 2 diabetes 4.  Hypertension 5.  Hyperlipidemia  6.  Peripheral neuropathy  1.  Check B1 2.  Check MRI of brain 3.  Schedule neuropsychological testing 4.  Follow up after testing.  Subjective:  Ronald Petersen is a 65 year old man who follows up for memory deficits  He is accompanied by his wife who supplements histoy  Since around October 2016, he reports increased cognitive problems including word-finding issues, misplacing objects and losing train of thought.  On occasion he may briefly get disoriented while driving.  He previously had transient episodes of confusion with headache and blurred/yellow vision.   MRI of the brain from 12/29/15 showed moderate cerebral and cerebellar atrophy.  EEG from 01/03/16 was normal.   He underwent neuropsychological testing on 02/18/16.  Testing met criteria for unspecified mental disorder due to reported cognitive decline and demonstration of variable pattern of cognitive weakness in non-contextual verbal memory, phonemic fluency, aspect of verbal category switching fluency, and borderline impaired simple graphomotor sequencing.  Pattern of performances suggest multiple factors, such as chronic pain with long-term opioid use, untreated OSA, insomnia, hypertension, daily alcohol consumption, poor diet, emotional distress, and feeling of helplessness and apathy are likely contributors.  However, an underlying neurodegenerative disease could not be ruled out.  He also met criteria for major depressive disorder.  He has history of alcohol dependence.  Patient has history of chronic neck and back pain with history of lumbar laminectomy.  He is on  multiple medications for chronic pain.  He reports radicular pain down the back of his legs when he walks.  Lumbar spine X-ray from 01/04/19 personally reviewed and demonstrated multilevel arthritic changes with moderate disc space narrowing at L4-5 and L5-S1 as well as facet arthritic changes at L3-4, L4-5 and L5-S1 bilaterally and anterior osteophytes at L4, L5, and S1.  He also reports worsening neck pain as well, radiating down both sides and into his shoulders.  Neck pain is followed by Dr. Lorin Mercy.  Due to chronic neck pain, cervical X-ray from 05/20/19 showed spondylosis most prominent at C5-6 and C6-7.  Since 2019, he reports worsening weakness and pain in the legs.  He also reports weakness in the upper extremities as well.  He has significant numbness in the legs as well as in the hands radiating up the arms.  He was diagnosed with severe carpal tunnel syndrome.  Labs from February 2020 include B12 342 and low B1 of 7.  He has a history of alcohol dependence and reports that he was drinking about 6 beers every other day up until early 2020.  He has diabetes which has been uncontrolled but has steadily improved over the past year (Hgb A1c 9.3 in June 2021, 6.2 in January 2022).  MRI of cervical spine on 07/24/2019 showed unchanged C5-6 left subarticular disc osteophyte complex with severe left neural foraminal stenosis.  At that time, there was discussion about performing a C5-6 ACDF but there reportedly was an issue with insurance.  He underwent left heart catheterization in December 2021 with stent placement in the LAD and LCx.  Since then, he reports increased fatigue, somnolence, dizziness and worsening bilateral lower extremity weakness.  He has  a sleep study scheduled.  Orthopedics have ordered a repeat MRI of cervical spine which is scheduled for 03/04/2021.  PAST MEDICAL HISTORY: Past Medical History:  Diagnosis Date  . Anxiety   . Arthritis   . Cancer ([place])   . Complication of anesthesia     aspirated with back surgery at age 40  . Depression   . Diabetes mellitus   . GERD (gastroesophageal reflux disease)   . Hyperlipidemia   . Hypertension   . Neuromuscular disorder ([place])    NEUROPATHY  . Right rotator cuff tear 11/23/2018  . Sleep apnea    no CPAP    MEDICATIONS: Current Outpatient Medications on File Prior to Visit  Medication Sig Dispense Refill  . aspirin EC 81 MG tablet Take 1 tablet (81 mg total) by mouth daily. Swallow whole. 90 tablet 3  . fenofibrate 160 MG tablet Take 1 tablet (160 mg total) by mouth daily. (Patient taking differently: Take 160 mg by mouth daily at 12 noon.) 90 tablet 1  . gabapentin (NEURONTIN) 800 MG tablet Take 1 tablet (800 mg total) by mouth 3 (three) times daily. 270 tablet 3  . glimepiride ([name]) 2 MG tablet Take 1 tablet (2 mg total) by mouth daily before breakfast. 90 tablet 3  . glucose blood (ONETOUCH VERIO) test strip Use as instructed 100 each 12  . isosorbide mononitrate (IMDUR) 30 MG 24 hr tablet Take 1 tablet (30 mg total) by mouth daily. 90 tablet 1  . lisinopril (ZESTRIL) 5 MG tablet Take 1 tablet (5 mg total) by mouth daily. 90 tablet 1  . metFORMIN ([name]) 500 MG 24 hr tablet Take 2 tablets (1,000 mg total) by mouth daily with breakfast AND 1 tablet (500 mg total) daily with supper. 270 tablet 3  . nitroGLYCERIN (NITROSTAT) 0.4 MG SL tablet Place 1 tablet (0.4 mg total) under the tongue every 5 (five) minutes as needed for chest pain. 90 tablet 3  . omeprazole (PRILOSEC) 40 MG capsule 1 po bid (Patient taking differently: Take 80 mg by mouth at bedtime.) 180 capsule 3  . ondansetron (ZOFRAN) 4 MG tablet Take 1 tablet (4 mg total) by mouth every 8 (eight) hours as needed. (Patient taking differently: Take 4 mg by mouth every 8 (eight) hours as needed for nausea or vomiting.) 20 tablet 2  . oxyCODONE-acetaminophen (PERCOCET/ROXICET) 5-325 MG tablet TAKE 1 TABLET BY MOUTH EVERY 6 HOURS AS NEEDED 90 tablet 0  . ranolazine  (RANEXA) 1000 MG SR tablet Take 1 tablet (1,000 mg total) by mouth 2 (two) times daily. 60 tablet 3  . rosuvastatin (CRESTOR) 40 MG tablet Take 1 tablet (40 mg total) by mouth daily. 90 tablet 3  . sennosides-docusate sodium (SENOKOT-S) 8.6-50 MG tablet Take 2 tablets by mouth daily. (Patient taking differently: Take 1 tablet by mouth daily as needed for constipation.) 30 tablet 1  . sertraline (ZOLOFT) 100 MG tablet Take 1 tablet (100 mg total) by mouth daily. (Patient taking differently: Take 100 mg by mouth at bedtime.) 90 tablet 1  . Suvorexant (BELSOMRA) 10 MG TABS Take 10 mg by mouth at bedtime as needed. May increase to two tablets (20 mg total) the next night if needed. 30 tablet 0  . ticagrelor (BRILINTA) 90 MG TABS tablet Take 1 tablet (90 mg total) by mouth 2 (two) times daily. 180 tablet 1  . tiZANidine (ZANAFLEX) 4 MG tablet TAKE 1 TABLET (4 MG TOTAL) BY MOUTH EVERY 6 (SIX) HOURS AS NEEDED FOR MUSCLE  SPASMS. 30 tablet 1  . traZODone (DESYREL) 50 MG tablet Take 0.5-1 tablets (25-50 mg total) by mouth at bedtime as needed for sleep. 30 tablet 3   No current facility-administered medications on file prior to visit.    ALLERGIES: Allergies  Allergen Reactions  . Niacin Anaphylaxis    FAMILY HISTORY: Family History  Problem Relation Age of Onset  . Ovarian cancer Sister   . Stomach cancer Sister   . Heart disease Sister        MI  . Heart disease Sister        MI  . Alcohol abuse Other   . Depression Other   . Arthritis Other   . Hypertension Other   . Stomach cancer Maternal Grandmother   . COPD Mother   . Stroke Father   . Coronary artery disease Other   . Ovarian cancer Other        neice  . Ovarian cancer Other        neice  . Colon cancer Other      Objective:  Blood pressure 136/61, pulse (!) 59, resp. rate 18, height 6' (1.829 m), weight 205 lb (93 kg), SpO2 98 %. General: No acute distress.  Patient appears well-groomed.   Head:   Normocephalic/atraumatic Eyes:  Fundi examined but not visualized Neck: supple, no paraspinal tenderness, full range of motion Heart:  Regular rate and rhythm Lungs:  Clear to auscultation bilaterally Back: No paraspinal tenderness Neurological Exam:  [employer] Mental Exam 02/26/2021  Weekday Correct 1  Current year 1  What state are we in? 1  Amount spent 1  Amount left 2  # of Animals 1  5 objects recall 2  Number series 1  Hour markers 2  Time correct 0  Placed X in triangle correctly 1  Largest Figure 1  Name of male 2  Date back to work 0  Type of work 0  State she lived in 0  Total score 16   CN II-XII intact. Bulk and tone normal, muscle strength 5/5 throughout.  Sensation to pinprick and vibratory sensation reduced in feet.  Deep tendon reflexes 1 throughout, toes downgoing.  Finger to nose  testing intact.  Gait wide-based.  Romberg with mild sway.     Metta Clines, DO  CC:  Roma Schanz, DO

## 2021-02-26 ENCOUNTER — Encounter: Payer: Self-pay | Admitting: Neurology

## 2021-02-26 ENCOUNTER — Ambulatory Visit: Payer: Medicare HMO | Admitting: Neurology

## 2021-02-26 ENCOUNTER — Other Ambulatory Visit: Payer: Medicare HMO

## 2021-02-26 ENCOUNTER — Other Ambulatory Visit: Payer: Self-pay

## 2021-02-26 VITALS — BP 136/61 | HR 59 | Resp 18 | Ht 72.0 in | Wt 205.0 lb

## 2021-02-26 DIAGNOSIS — I25119 Atherosclerotic heart disease of native coronary artery with unspecified angina pectoris: Secondary | ICD-10-CM | POA: Diagnosis not present

## 2021-02-26 DIAGNOSIS — E1142 Type 2 diabetes mellitus with diabetic polyneuropathy: Secondary | ICD-10-CM

## 2021-02-26 DIAGNOSIS — I159 Secondary hypertension, unspecified: Secondary | ICD-10-CM

## 2021-02-26 DIAGNOSIS — G6289 Other specified polyneuropathies: Secondary | ICD-10-CM | POA: Diagnosis not present

## 2021-02-26 DIAGNOSIS — R4189 Other symptoms and signs involving cognitive functions and awareness: Secondary | ICD-10-CM

## 2021-02-26 DIAGNOSIS — E785 Hyperlipidemia, unspecified: Secondary | ICD-10-CM | POA: Diagnosis not present

## 2021-02-26 LAB — TSH: TSH: 1.82 u[IU]/mL (ref 0.35–4.50)

## 2021-02-26 LAB — VITAMIN B12: Vitamin B-12: 306 pg/mL (ref 211–911)

## 2021-02-26 NOTE — Patient Instructions (Addendum)
1.  Check B12, B1 and TSH 2.  Check MRI of brain without contrast 3.  Neuropsychological testing 4.  Follow up after testing.    Your provider has requested that you have labwork completed today. Please go to [facility] ([id number]) on the second floor of this building before leaving the office today. You do not need to check in. If you are not called within 15 minutes please check with the front desk.  We have sent a referral to [facility] for your MRI and they will call you directly to schedule your appointment. They are located at [place]. If you need to contact them directly please call [phone].

## 2021-03-02 ENCOUNTER — Other Ambulatory Visit: Payer: Self-pay

## 2021-03-02 ENCOUNTER — Ambulatory Visit (HOSPITAL_BASED_OUTPATIENT_CLINIC_OR_DEPARTMENT_OTHER): Payer: Medicare HMO | Attending: Cardiology | Admitting: Cardiovascular Disease

## 2021-03-02 DIAGNOSIS — E8881 Metabolic syndrome: Secondary | ICD-10-CM | POA: Diagnosis not present

## 2021-03-02 DIAGNOSIS — Z7984 Long term (current) use of oral hypoglycemic drugs: Secondary | ICD-10-CM | POA: Diagnosis not present

## 2021-03-02 DIAGNOSIS — E785 Hyperlipidemia, unspecified: Secondary | ICD-10-CM | POA: Diagnosis not present

## 2021-03-02 DIAGNOSIS — Z79891 Long term (current) use of opiate analgesic: Secondary | ICD-10-CM | POA: Diagnosis not present

## 2021-03-02 DIAGNOSIS — G4733 Obstructive sleep apnea (adult) (pediatric): Secondary | ICD-10-CM | POA: Insufficient documentation

## 2021-03-02 DIAGNOSIS — Z79899 Other long term (current) drug therapy: Secondary | ICD-10-CM | POA: Insufficient documentation

## 2021-03-02 DIAGNOSIS — I2510 Atherosclerotic heart disease of native coronary artery without angina pectoris: Secondary | ICD-10-CM | POA: Diagnosis not present

## 2021-03-02 DIAGNOSIS — G4736 Sleep related hypoventilation in conditions classified elsewhere: Secondary | ICD-10-CM | POA: Diagnosis not present

## 2021-03-02 LAB — VITAMIN B1: Vitamin B1 (Thiamine): 13 nmol/L (ref 8–30)

## 2021-03-04 ENCOUNTER — Ambulatory Visit
Admission: RE | Admit: 2021-03-04 | Discharge: 2021-03-04 | Disposition: A | Discharge status: Home / Self Care | Payer: Medicare HMO | Source: Ambulatory Visit | Attending: Surgery | Admitting: Surgery

## 2021-03-04 ENCOUNTER — Other Ambulatory Visit: Payer: Self-pay

## 2021-03-04 DIAGNOSIS — M4722 Other spondylosis with radiculopathy, cervical region: Secondary | ICD-10-CM

## 2021-03-04 DIAGNOSIS — M542 Cervicalgia: Secondary | ICD-10-CM

## 2021-03-04 DIAGNOSIS — M4802 Spinal stenosis, cervical region: Secondary | ICD-10-CM | POA: Diagnosis not present

## 2021-03-05 ENCOUNTER — Encounter: Payer: Self-pay | Admitting: Cardiology

## 2021-03-05 ENCOUNTER — Other Ambulatory Visit: Payer: Self-pay | Admitting: Cardiology

## 2021-03-05 ENCOUNTER — Ambulatory Visit: Payer: Medicare HMO | Admitting: Cardiology

## 2021-03-05 VITALS — BP 130/66 | HR 62 | Ht 72.0 in | Wt 199.0 lb

## 2021-03-05 DIAGNOSIS — G4733 Obstructive sleep apnea (adult) (pediatric): Secondary | ICD-10-CM

## 2021-03-05 DIAGNOSIS — E782 Mixed hyperlipidemia: Secondary | ICD-10-CM | POA: Diagnosis not present

## 2021-03-05 DIAGNOSIS — I700 Atherosclerosis of aorta: Secondary | ICD-10-CM | POA: Diagnosis not present

## 2021-03-05 DIAGNOSIS — IMO0002 Reserved for concepts with insufficient information to code with codable children: Secondary | ICD-10-CM

## 2021-03-05 DIAGNOSIS — I2510 Atherosclerotic heart disease of native coronary artery without angina pectoris: Secondary | ICD-10-CM | POA: Diagnosis not present

## 2021-03-05 DIAGNOSIS — Z9861 Coronary angioplasty status: Secondary | ICD-10-CM

## 2021-03-05 DIAGNOSIS — E1165 Type 2 diabetes mellitus with hyperglycemia: Secondary | ICD-10-CM | POA: Diagnosis not present

## 2021-03-05 DIAGNOSIS — E1151 Type 2 diabetes mellitus with diabetic peripheral angiopathy without gangrene: Secondary | ICD-10-CM | POA: Diagnosis not present

## 2021-03-05 MED ORDER — FUROSEMIDE 40 MG PO TABS: 40.0000 mg | ORAL_TABLET | ORAL | 3 refills | Status: DC

## 2021-03-05 MED ORDER — POTASSIUM CHLORIDE CRYS ER 20 MEQ PO TBCR: 20.0000 meq | EXTENDED_RELEASE_TABLET | ORAL | 3 refills | Status: DC

## 2021-03-05 MED FILL — FUROSEMIDE 40 MG TAB: 40 | 90 days supply | Qty: 45 | Fill #0

## 2021-03-05 MED FILL — POTASSIUM CHLORIDE CRYS ER: 20 | 90 days supply | Qty: 45 | Fill #0

## 2021-03-05 NOTE — Patient Instructions (Signed)
Medication Instructions:  Your physician has recommended you make the following change in your medication:   START: LASIX 40mg  EVERY OTHER DAY                KCL 20 MEQ EVERY OTHER DAY *If you need a refill on your cardiac medications before your next appointment, please call your pharmacy*   Lab Work: NONE If you have labs (blood work) drawn today and your tests are completely normal, you will receive your results only by: Marland Kitchen MyChart Message (if you have MyChart) OR . A paper copy in the mail If you have any lab test that is abnormal or we need to change your treatment, we will call you to review the results.   Testing/Procedures: NONE   Follow-Up: At [facility], you and your health needs are our priority.  As part of our continuing mission to provide you with exceptional heart care, we have created designated Provider Care Teams.  These Care Teams include your primary Cardiologist (physician) and Advanced Practice Providers (APPs -  Physician Assistants and Nurse Practitioners) who all work together to provide you with the care you need, when you need it.  We recommend signing up for the patient portal called "MyChart".  Sign up information is provided on this After Visit Summary.  MyChart is used to connect with patients for Virtual Visits (Telemedicine).  Patients are able to view lab/test results, encounter notes, upcoming appointments, etc.  Non-urgent messages can be sent to your provider as well.   To learn more about what you can do with MyChart, go to [email or web address].    Your next appointment:   3 month(s)  The format for your next appointment:   In Person  Provider:   Berniece Salines, DO   Other Instructions

## 2021-03-05 NOTE — Progress Notes (Signed)
Cardiology Office Note:    Date:  03/05/2021   ID:  Ronald Petersen, DOB 08/29/1956, MRN [id number]  PCP:  [name], Alferd Apa, DO  Cardiologist:  Berniece Salines, DO  Electrophysiologist:  None   Referring MD: [name], Alferd Apa, *   I am feeling short of breath and building up fluid  History of Present Illness:    Ronald Petersen is a 65 y.o. 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   During that time his Imdur was stopped and I started him on Ranexa.  I have also increase his Ranexa to 500 mg twice a day.  In December he was seen at the emergency department [facility] he subsequently underwent a left heart catheterization given the fact that he had had intermittent worsening chest pain.   I saw the patient on December 30, 2018 at that time he was willing to get back on the Imdur but he tells me he started when he started to feel tired in lots of headache so he had stopped this medication.  He continue his Ranexa.  Is a complete risk factor he has been short of breath on exertion and has had significant leg swelling.  He has sleep study recently and we are waiting for the results.  He also did see urology and he has been diagnosed with vitamin B12 deficiency.   Past Medical History:  Diagnosis Date  . Anxiety   . Arthritis   . Cancer ([place])   . Complication of anesthesia    aspirated with back surgery at age 13  . Depression   . Diabetes mellitus   . GERD (gastroesophageal reflux disease)   . Hyperlipidemia   . Hypertension   . Neuromuscular disorder ([place])    NEUROPATHY  . Right rotator cuff tear  11/23/2018  . Sleep apnea    no CPAP    Past Surgical History:  Procedure Laterality Date  . APPENDECTOMY    . ARTHOSCOPIC ROTAOR CUFF REPAIR Right 11/23/2018   Procedure: ARTHROSCOPIC ROTATOR CUFF REPAIR;  Surgeon: [name], MD;  Location: [place];  Service: Orthopedics;  Laterality: Right;  . CHOLECYSTECTOMY    . CORONARY STENT INTERVENTION N/A 11/19/2020   Procedure: CORONARY STENT INTERVENTION;  Surgeon: Nelva Bush, MD;  Location: [place] [facility];  Service: Cardiovascular;  Laterality: N/A;  . INTRAVASCULAR ULTRASOUND/IVUS N/A 11/19/2020   Procedure: Intravascular Ultrasound/IVUS;  Surgeon: Nelva Bush, MD;  Location: [place] [facility];  Service: Cardiovascular;  Laterality: N/A;  . LEFT HEART CATH AND CORONARY ANGIOGRAPHY N/A 11/19/2020   Procedure: LEFT HEART CATH AND CORONARY ANGIOGRAPHY;  Surgeon: Nelva Bush, MD;  Location: [place];  Service: Cardiovascular;  Laterality: N/A;  . LEFT HEART CATH AND CORONARY ANGIOGRAPHY N/A 12/24/2020   Procedure: LEFT HEART CATH AND CORONARY ANGIOGRAPHY;  Surgeon: Martinique, Peter M, MD;  Location: [facility];  Service: Cardiovascular;  Laterality: N/A;  . LUMBAR LAMINECTOMY    . SHOULDER ARTHROSCOPY WITH ROTATOR CUFF REPAIR AND SUBACROMIAL DECOMPRESSION Right 11/23/2018   Procedure: SHOULDER ARTHROSCOPY WITH ROTATOR CUFF REPAIR  AND SUBACROMIAL DECOMPRESSION;  Surgeon: [name], MD;  Location: [place];  Service: Orthopedics;  Laterality: Right;    Current Medications: Current Meds  Medication Sig  . aspirin EC 81 MG tablet Take 1 tablet (81 mg total) by mouth daily. Swallow whole.  . fenofibrate 160 MG tablet Take 1 tablet (160 mg total) by mouth daily. (Patient taking differently: Take 160 mg by mouth daily at 12 noon.)  . furosemide (LASIX) 40 MG tablet Take 1 tablet (40 mg total) by mouth every other day.  . gabapentin (NEURONTIN) 800 MG tablet Take 1 tablet  (800 mg total) by mouth 3 (three) times daily.  Marland Kitchen glimepiride ([name]) 2 MG tablet Take 1 tablet (2 mg total) by mouth daily before breakfast.  . glucose blood (ONETOUCH VERIO) test strip Use as instructed  . lisinopril (ZESTRIL) 5 MG tablet Take 1 tablet (5 mg total) by mouth daily.  . metFORMIN ([name]) 500 MG 24 hr tablet Take 2 tablets (1,000 mg total) by mouth daily with breakfast AND 1 tablet (500 mg total) daily with supper.  Marland Kitchen omeprazole (PRILOSEC) 40 MG capsule 1 po bid (Patient taking differently: Take 80 mg by mouth at bedtime.)  . ondansetron (ZOFRAN) 4 MG tablet Take 1 tablet (4 mg total) by mouth every 8 (eight) hours as needed. (Patient taking differently: Take 4 mg by mouth every 8 (eight) hours as needed for nausea or vomiting.)  . oxyCODONE-acetaminophen (PERCOCET/ROXICET) 5-325 MG tablet TAKE 1 TABLET BY MOUTH EVERY 6 HOURS AS NEEDED  . potassium chloride SA (KLOR-CON) 20 MEQ tablet Take 1 tablet (20 mEq total) by mouth every other day.  . ranolazine (RANEXA) 1000 MG SR tablet Take 1 tablet (1,000 mg total) by mouth 2 (two) times daily.  . rosuvastatin (CRESTOR) 40 MG tablet Take 1 tablet (40 mg total) by mouth daily.  . sennosides-docusate sodium (SENOKOT-S) 8.6-50 MG tablet Take 2 tablets by mouth daily. (Patient taking differently: Take 1 tablet by mouth daily as needed for constipation.)  . sertraline (ZOLOFT) 100 MG tablet Take 1 tablet (100 mg total) by mouth daily. (Patient taking differently: Take 100 mg by mouth at bedtime.)  . Suvorexant (BELSOMRA) 10 MG TABS Take 10 mg by mouth at bedtime as needed. May increase to two tablets (20 mg total) the next night if needed.  . ticagrelor (BRILINTA) 90 MG TABS tablet Take 1 tablet (90 mg total) by mouth 2 (two) times daily.  Marland Kitchen tiZANidine (ZANAFLEX) 4 MG tablet TAKE 1 TABLET (4 MG TOTAL) BY MOUTH EVERY 6 (SIX) HOURS AS NEEDED FOR MUSCLE SPASMS.  Marland Kitchen traZODone (DESYREL) 50 MG tablet Take 0.5-1 tablets (25-50 mg total) by mouth  at bedtime as needed for sleep.  . [DISCONTINUED] isosorbide mononitrate (IMDUR) 30 MG 24 hr tablet Take 1 tablet (30 mg total) by mouth daily.     Allergies:   Niacin   Social History   Socioeconomic History  . Marital status: Married    Spouse name: Lorriane Shire  . Number of children: 2  . Years of education: Not on file  . Highest education level: Not on file  Occupational History  . Occupation: self employed    Fish farm manager: UNEMPLOYED  Tobacco Use  . Smoking status: Former Smoker    Years: 16.00    Types: Cigarettes    Quit date: 12/29/1985    Years since quitting: 35.2  . Smokeless tobacco: Never Used  Vaping Use  . Vaping Use: Never used  Substance and  Sexual Activity  . Alcohol use: Yes    Alcohol/week: 4.0 - 5.0 standard drinks    Types: 4 - 5 Cans of beer per week    Comment: 5 times per week  . Drug use: No  . Sexual activity: Yes    Partners: Female  Other Topics Concern  . Not on file  Social History Narrative   Exercise-- 3 days   Pt has hs degree   Right handed   One story home   Drinks caffeine   Social Determinants of Health   Financial Resource Strain: Not on file  Food Insecurity: Not on file  Transportation Needs: Not on file  Physical Activity: Not on file  Stress: Not on file  Social Connections: Not on file     Family History: The patient's family history includes Alcohol abuse in an other family member; Arthritis in an other family member; COPD in his mother; Colon cancer in an other family member; Coronary artery disease in an other family member; Depression in an other family member; Heart disease in his sister and sister; Hypertension in an other family member; Ovarian cancer in his sister and other family members; Stomach cancer in his maternal grandmother and sister; Stroke in his father.  ROS:   Review of Systems  Constitution: Negative for decreased appetite, fever and weight gain.  HENT: Negative for congestion, ear discharge, hoarse  voice and sore throat.   Eyes: Negative for discharge, redness, vision loss in right eye and visual halos.  Cardiovascular: Negative for chest pain, dyspnea on exertion, leg swelling, orthopnea and palpitations.  Respiratory: Negative for cough, hemoptysis, shortness of breath and snoring.   Endocrine: Negative for heat intolerance and polyphagia.  Hematologic/Lymphatic: Negative for bleeding problem. Does not bruise/bleed easily.  Skin: Negative for flushing, nail changes, rash and suspicious lesions.  Musculoskeletal: Negative for arthritis, joint pain, muscle cramps, myalgias, neck pain and stiffness.  Gastrointestinal: Negative for abdominal pain, bowel incontinence, diarrhea and excessive appetite.  Genitourinary: Negative for decreased libido, genital sores and incomplete emptying.  Neurological: Negative for brief paralysis, focal weakness, headaches and loss of balance.  Psychiatric/Behavioral: Negative for altered mental status, depression and suicidal ideas.  Allergic/Immunologic: Negative for HIV exposure and persistent infections.    EKGs/Labs/Other Studies Reviewed:    The following studies were reviewed today:   EKG: None today  Left heart catheterization December 24, 2020  1st Diag lesion is 25% stenosed.  Previously placed Prox LAD to Mid LAD drug eluting stent is widely patent.  Previously placed 1st Mrg drug eluting stent is widely patent.  Previously placed Prox Cx drug-eluting stents are widely patent.  RPAV lesion is 50% stenosed.  LV end diastolic pressure is normal.  1. Nonobstructive CAD. Excellent patency of stents in the LAD and LCx. Lesion in the PL branch reduced from 70% to 50%. 2. Normal LV filling pressures.   Recent Labs: 09/06/2020: ALT 24 12/23/2020: Hemoglobin 14.5; Platelets 221 01/17/2021: BUN 22; Creatinine, Ser 1.09; Magnesium 1.9; Potassium 4.9; Sodium 136 02/26/2021: TSH 1.82  Recent Lipid Panel    Component Value Date/Time   CHOL  157 09/06/2020 1138   TRIG 174 (H) 09/06/2020 1138   TRIG 148 12/10/2006 1603   HDL 34 (L) 09/06/2020 1138   CHOLHDL 4.6 09/06/2020 1138   VLDL 53.6 (H) 05/31/2020 0910   LDLCALC 96 09/06/2020 1138   LDLDIRECT 93.0 05/31/2020 0910    Physical Exam:    VS:  BP 130/66   Pulse 62  Ht 6' (1.829 m)   Wt 199 lb (90.3 kg)   SpO2 97%   BMI 26.99 kg/m     Wt Readings from Last 3 Encounters:  03/05/21 199 lb (90.3 kg)  03/02/21 195 lb (88.5 kg)  02/26/21 205 lb (93 kg)     GEN: Well nourished, well developed in no acute distress HEENT: Normal NECK: No JVD; No carotid bruits LYMPHATICS: No lymphadenopathy CARDIAC: S1S2 noted,RRR, no murmurs, rubs, gallops RESPIRATORY:  Clear to auscultation without rales, wheezing or rhonchi  ABDOMEN: Soft, non-tender, non-distended, +bowel sounds, no guarding. EXTREMITIES: +1 bilateral leg edema edema, No cyanosis, no clubbing MUSCULOSKELETAL:  No deformity  SKIN: Warm and dry NEUROLOGIC:  Alert and oriented x 3, non-focal PSYCHIATRIC:  Normal affect, good insight  ASSESSMENT:    1. DM (diabetes mellitus) type II uncontrolled, periph vascular disorder ([place])   2. Aortic atherosclerosis ([place])   3. CAD S/P DES PCI LAD & LCx-OM   4. Coronary artery disease involving native coronary artery of native heart without angina pectoris   5. OSA (obstructive sleep apnea)   6. Mixed hyperlipidemia    PLAN:     Shortness of breath as well as bilateral leg edema rhythm going to start the patient on Lasix 40 mg every other day to see if this is going to help with his shortness of breath and his leg swelling.  We will give potassium supplement as well.   No angina symptoms no chest pain continue patient on his dual antiplatelet therapy with aspirin Brilinta as well as his statin medication.  We will continue patient on his diabetes medication  He is pending resulting on his sleep study.  The patient is in agreement with the above plan. The patient  left the office in stable condition.  The patient will follow up in 3 months or sooner if needed.   Medication Adjustments/Labs and Tests Ordered: Current medicines are reviewed at length with the patient today.  Concerns regarding medicines are outlined above.  No orders of the defined types were placed in this encounter.  Meds ordered this encounter  Medications  . furosemide (LASIX) 40 MG tablet    Sig: Take 1 tablet (40 mg total) by mouth every other day.    Dispense:  90 tablet    Refill:  3  . potassium chloride SA (KLOR-CON) 20 MEQ tablet    Sig: Take 1 tablet (20 mEq total) by mouth every other day.    Dispense:  90 tablet    Refill:  3    Patient Instructions  Medication Instructions:  Your physician has recommended you make the following change in your medication:   START: LASIX 40mg  EVERY OTHER DAY                [facility] [address] [place] *If you need a refill on your cardiac medications before your next appointment, please call your pharmacy*   Lab Work: NONE If you have labs (blood work) drawn today and your tests are completely normal, you will receive your results only by: Marland Kitchen MyChart Message (if you have MyChart) OR . A paper copy in the mail If you have any lab test that is abnormal or we need to change your treatment, we will call you to review the results.   Testing/Procedures: NONE   Follow-Up: At [facility], you and your health needs are our priority.  As part of our continuing mission to provide you with exceptional heart care, we  have created designated Provider Care Teams.  These Care Teams include your primary Cardiologist (physician) and Advanced Practice Providers (APPs -  Physician Assistants and Nurse Practitioners) who all work together to provide you with the care you need, when you need it.  We recommend signing up for the patient portal called "MyChart".  Sign up information is provided on this After Visit Summary.  MyChart is used to  connect with patients for Virtual Visits (Telemedicine).  Patients are able to view lab/test results, encounter notes, upcoming appointments, etc.  Non-urgent messages can be sent to your provider as well.   To learn more about what you can do with MyChart, go to [email or web address].    Your next appointment:   3 month(s)  The format for your next appointment:   In Person  Provider:   Berniece Salines, DO   Other Instructions      Adopting a Healthy Lifestyle.  Know what a healthy weight is for you (roughly BMI <25) and aim to maintain this   Aim for 7+ servings of fruits and vegetables daily   65-80+ fluid ounces of water or unsweet tea for healthy kidneys   Limit to max 1 drink of alcohol per day; avoid smoking/tobacco   Limit animal fats in diet for cholesterol and heart health - choose grass fed whenever available   Avoid highly processed foods, and foods high in saturated/trans fats   Aim for low stress - take time to unwind and care for your mental health   Aim for 150 min of moderate intensity exercise weekly for heart health, and weights twice weekly for bone health   Aim for 7-9 hours of sleep daily   When it comes to diets, agreement about the perfect plan isnt easy to find, even among the experts. Experts at the [employer] developed an idea known as the Healthy Eating Plate. Just imagine a plate divided into logical, healthy portions.   The emphasis is on diet quality:   Load up on vegetables and fruits - one-half of your plate: Aim for color and variety, and remember that potatoes dont count.   Go for whole grains - one-quarter of your plate: Whole wheat, barley, wheat berries, quinoa, oats, brown rice, and foods made with them. If you want pasta, go with whole wheat pasta.   Protein power - one-quarter of your plate: Fish, chicken, beans, and nuts are all healthy, versatile protein sources. Limit red meat.   The diet, however, does  go beyond the plate, offering a few other suggestions.   Use healthy plant oils, such as olive, canola, soy, corn, sunflower and peanut. Check the labels, and avoid partially hydrogenated oil, which have unhealthy trans fats.   If youre thirsty, drink water. Coffee and tea are good in moderation, but skip sugary drinks and limit milk and dairy products to one or two daily servings.   The type of carbohydrate in the diet is more important than the amount. Some sources of carbohydrates, such as vegetables, fruits, whole grains, and beans-are healthier than others.   Finally, stay active  Signed, Berniece Salines, DO  03/05/2021 3:20 PM    [facility]

## 2021-03-05 NOTE — Progress Notes (Signed)
Pt advised of his lab results, recommendation of starting B12 OTC 1000 mcg.

## 2021-03-07 ENCOUNTER — Other Ambulatory Visit: Payer: Self-pay

## 2021-03-07 ENCOUNTER — Other Ambulatory Visit: Payer: Self-pay | Admitting: Family Medicine

## 2021-03-07 ENCOUNTER — Ambulatory Visit (INDEPENDENT_AMBULATORY_CARE_PROVIDER_SITE_OTHER): Payer: Medicare HMO | Admitting: Family Medicine

## 2021-03-07 ENCOUNTER — Encounter: Payer: Self-pay | Admitting: Family Medicine

## 2021-03-07 VITALS — BP 120/74 | HR 55 | Temp 97.8°F | Resp 18 | Wt 197.6 lb

## 2021-03-07 DIAGNOSIS — F32A Depression, unspecified: Secondary | ICD-10-CM | POA: Diagnosis not present

## 2021-03-07 DIAGNOSIS — R110 Nausea: Secondary | ICD-10-CM

## 2021-03-07 DIAGNOSIS — R1013 Epigastric pain: Secondary | ICD-10-CM | POA: Diagnosis not present

## 2021-03-07 DIAGNOSIS — E785 Hyperlipidemia, unspecified: Secondary | ICD-10-CM | POA: Diagnosis not present

## 2021-03-07 DIAGNOSIS — I10 Essential (primary) hypertension: Secondary | ICD-10-CM

## 2021-03-07 DIAGNOSIS — G8929 Other chronic pain: Secondary | ICD-10-CM

## 2021-03-07 DIAGNOSIS — Z79899 Other long term (current) drug therapy: Secondary | ICD-10-CM

## 2021-03-07 DIAGNOSIS — I25119 Atherosclerotic heart disease of native coronary artery with unspecified angina pectoris: Secondary | ICD-10-CM

## 2021-03-07 DIAGNOSIS — R69 Illness, unspecified: Secondary | ICD-10-CM | POA: Diagnosis not present

## 2021-03-07 DIAGNOSIS — E1165 Type 2 diabetes mellitus with hyperglycemia: Secondary | ICD-10-CM

## 2021-03-07 DIAGNOSIS — E1169 Type 2 diabetes mellitus with other specified complication: Secondary | ICD-10-CM

## 2021-03-07 LAB — COMPREHENSIVE METABOLIC PANEL
ALT: 16 U/L (ref 0–53)
AST: 22 U/L (ref 0–37)
Albumin: 4.5 g/dL (ref 3.5–5.2)
Alkaline Phosphatase: 64 U/L (ref 39–117)
BUN: 17 mg/dL (ref 6–23)
CO2: 31 mEq/L (ref 19–32)
Calcium: 9.9 mg/dL (ref 8.4–10.5)
Chloride: 98 mEq/L (ref 96–112)
Creatinine, Ser: 1.06 mg/dL (ref 0.40–1.50)
GFR: 73.55 mL/min (ref >60.00)
Glucose, Bld: 106 mg/dL — ABNORMAL HIGH (ref 70–99)
Potassium: 4.4 mEq/L (ref 3.5–5.1)
Sodium: 136 mEq/L (ref 135–145)
Total Bilirubin: 0.6 mg/dL (ref 0.2–1.2)
Total Protein: 7.1 g/dL (ref 6.0–8.3)

## 2021-03-07 LAB — LIPID PANEL
Cholesterol: 87 mg/dL (ref 0–200)
HDL: 32.3 mg/dL — ABNORMAL LOW (ref >39.00)
LDL Cholesterol: 29 mg/dL (ref 0–99)
NonHDL: 54.69
Total CHOL/HDL Ratio: 3
Triglycerides: 127 mg/dL (ref 0.0–149.0)
VLDL: 25.4 mg/dL (ref 0.0–40.0)

## 2021-03-07 MED ORDER — SERTRALINE HCL 100 MG PO TABS: ORAL_TABLET | ORAL | 3 refills | Status: DC

## 2021-03-07 MED ORDER — CYANOCOBALAMIN 1000 MCG/ML IJ SOLN
1000.0000 ug | Freq: Once | INTRAMUSCULAR | Status: AC
  Administered 2021-03-07: 1000 ug via INTRAMUSCULAR

## 2021-03-07 MED ORDER — OXYCODONE-ACETAMINOPHEN 5-325 MG PO TABS
1.0000 | ORAL_TABLET | ORAL | 0 refills | Status: DC | PRN
Start: 2021-03-07 — End: 2021-03-07

## 2021-03-07 MED ORDER — PANTOPRAZOLE SODIUM 40 MG PO TBEC: 40.0000 mg | DELAYED_RELEASE_TABLET | Freq: Every day | ORAL | 3 refills | Status: DC

## 2021-03-07 MED FILL — PANTOPRAZOLE SOD DR 40 MG T: 40 | 30 days supply | Qty: 30 | Fill #0

## 2021-03-07 NOTE — Patient Instructions (Signed)

## 2021-03-07 NOTE — Assessment & Plan Note (Signed)
Per endo °

## 2021-03-07 NOTE — Progress Notes (Signed)
Patient ID: Ronald Petersen, male    DOB: [birth year]  Age: 65 y.o. MRN: [id number]    Subjective:  Subjective  HPI Ronald Petersen presents for f/u chol and htn and chronic pain  The pain is worse and he is asking to inc the oxy.  He had a ct neck and will see neurosurgery later this month He also c/o inc in nausea and burping.  The omeprazole bid is not helping   Review of Systems  Constitutional: Negative for appetite change, diaphoresis, fatigue and unexpected weight change.  Eyes: Negative for pain, redness and visual disturbance.  Respiratory: Negative for cough, chest tightness, shortness of breath and wheezing.   Cardiovascular: Negative for chest pain, palpitations and leg swelling.  Gastrointestinal: Positive for nausea. Negative for vomiting.  Endocrine: Negative for cold intolerance, heat intolerance, polydipsia, polyphagia and polyuria.  Genitourinary: Negative for difficulty urinating, dysuria and frequency.  Neurological: Negative for dizziness, light-headedness, numbness and headaches.    History Past Medical History:  Diagnosis Date  . Anxiety   . Arthritis   . Cancer ([place])   . Complication of anesthesia    aspirated with back surgery at age 60  . Depression   . Diabetes mellitus   . GERD (gastroesophageal reflux disease)   . Hyperlipidemia   . Hypertension   . Neuromuscular disorder ([place])    NEUROPATHY  . Right rotator cuff tear 11/23/2018  . Sleep apnea    no CPAP    He has a past surgical history that includes Appendectomy; Cholecystectomy; Lumbar laminectomy; Shoulder arthroscopy with rotator cuff repair and subacromial decompression (Right, 11/23/2018); Arthroscopic rotator cuff repair (Right, 11/23/2018); LEFT HEART CATH AND CORONARY ANGIOGRAPHY (N/A, 11/19/2020); Intravascular Ultrasound/IVUS (N/A, 11/19/2020); CORONARY STENT INTERVENTION (N/A, 11/19/2020); and LEFT HEART CATH AND CORONARY ANGIOGRAPHY (N/A, 12/24/2020).   His family history includes  Alcohol abuse in an other family member; Arthritis in an other family member; COPD in his mother; Colon cancer in an other family member; Coronary artery disease in an other family member; Depression in an other family member; Heart disease in his sister and sister; Hypertension in an other family member; Ovarian cancer in his sister and other family members; Stomach cancer in his maternal grandmother and sister; Stroke in his father.He reports that he quit smoking about 35 years ago. His smoking use included cigarettes. He quit after 16.00 years of use. He has never used smokeless tobacco. He reports current alcohol use of about 4.0 - 5.0 standard drinks of alcohol per week. He reports that he does not use drugs.  Current Outpatient Medications on File Prior to Visit  Medication Sig Dispense Refill  . aspirin EC 81 MG tablet Take 1 tablet (81 mg total) by mouth daily. Swallow whole. 90 tablet 3  . fenofibrate 160 MG tablet Take 1 tablet (160 mg total) by mouth daily. (Patient taking differently: Take 160 mg by mouth daily at 12 noon.) 90 tablet 1  . furosemide (LASIX) 40 MG tablet Take 1 tablet (40 mg total) by mouth every other day. 90 tablet 3  . gabapentin (NEURONTIN) 800 MG tablet Take 1 tablet (800 mg total) by mouth 3 (three) times daily. 270 tablet 3  . glimepiride ([name]) 2 MG tablet Take 1 tablet (2 mg total) by mouth daily before breakfast. 90 tablet 3  . glucose blood (ONETOUCH VERIO) test strip Use as instructed 100 each 12  . lisinopril (ZESTRIL) 5 MG tablet Take 1 tablet (5 mg total) by mouth daily. [place]  tablet 1  . metFORMIN ([name]) 500 MG 24 hr tablet Take 2 tablets (1,000 mg total) by mouth daily with breakfast AND 1 tablet (500 mg total) daily with supper. 270 tablet 3  . ondansetron (ZOFRAN) 4 MG tablet Take 1 tablet (4 mg total) by mouth every 8 (eight) hours as needed. (Patient taking differently: Take 4 mg by mouth every 8 (eight) hours as needed for nausea or vomiting.) 20  tablet 2  . potassium chloride SA (KLOR-CON) 20 MEQ tablet Take 1 tablet (20 mEq total) by mouth every other day. 90 tablet 3  . ranolazine (RANEXA) 1000 MG SR tablet Take 1 tablet (1,000 mg total) by mouth 2 (two) times daily. 60 tablet 3  . rosuvastatin (CRESTOR) 40 MG tablet Take 1 tablet (40 mg total) by mouth daily. 90 tablet 3  . sennosides-docusate sodium (SENOKOT-S) 8.6-50 MG tablet Take 2 tablets by mouth daily. (Patient taking differently: Take 1 tablet by mouth daily as needed for constipation.) 30 tablet 1  . Suvorexant (BELSOMRA) 10 MG TABS Take 10 mg by mouth at bedtime as needed. May increase to two tablets (20 mg total) the next night if needed. 30 tablet 0  . ticagrelor (BRILINTA) 90 MG TABS tablet Take 1 tablet (90 mg total) by mouth 2 (two) times daily. 180 tablet 1  . tiZANidine (ZANAFLEX) 4 MG tablet TAKE 1 TABLET (4 MG TOTAL) BY MOUTH EVERY 6 (SIX) HOURS AS NEEDED FOR MUSCLE SPASMS. 30 tablet 1  . traZODone (DESYREL) 50 MG tablet Take 0.5-1 tablets (25-50 mg total) by mouth at bedtime as needed for sleep. 30 tablet 3  . nitroGLYCERIN (NITROSTAT) 0.4 MG SL tablet Place 1 tablet (0.4 mg total) under the tongue every 5 (five) minutes as needed for chest pain. 90 tablet 3   No current facility-administered medications on file prior to visit.     Objective:  Objective  Physical Exam Vitals and nursing note reviewed.  Constitutional:      General: He is sleeping.     Appearance: He is well-developed.  HENT:     Head: Normocephalic and atraumatic.  Eyes:     Pupils: Pupils are equal, round, and reactive to light.  Neck:     Thyroid: No thyromegaly.  Cardiovascular:     Rate and Rhythm: Normal rate and regular rhythm.     Heart sounds: No murmur heard.   Pulmonary:     Effort: Pulmonary effort is normal. No respiratory distress.     Breath sounds: Normal breath sounds. No wheezing or rales.  Chest:     Chest wall: No tenderness.  Musculoskeletal:        General: No  tenderness.     Cervical back: Normal range of motion and neck supple.  Skin:    General: Skin is warm and dry.  Neurological:     Mental Status: He is oriented to person, place, and time.  Psychiatric:        Behavior: Behavior normal.        Thought Content: Thought content normal.        Judgment: Judgment normal.    BP 120/74 (BP Location: Left Arm, Patient Position: Sitting, Cuff Size: Normal)   Pulse (!) 55   Temp 97.8 F (36.6 C) (Oral)   Resp 18   Wt 197 lb 9.6 oz (89.6 kg)   SpO2 100%   BMI 26.80 kg/m  Wt Readings from Last 3 Encounters:  03/07/21 197 lb 9.6 oz (89.6 kg)  03/05/21 199 lb (90.3 kg)  03/02/21 195 lb (88.5 kg)     Lab Results  Component Value Date   WBC 9.9 12/23/2020   HGB 14.5 12/23/2020   HCT 41.3 12/23/2020   PLT 221 12/23/2020   GLUCOSE 108 (H) 01/17/2021   CHOL 157 09/06/2020   TRIG 174 (H) 09/06/2020   HDL 34 (L) 09/06/2020   LDLDIRECT 93.0 05/31/2020   LDLCALC 96 09/06/2020   ALT 24 09/06/2020   AST 22 09/06/2020   NA 136 01/17/2021   K 4.9 01/17/2021   CL 97 01/17/2021   CREATININE 1.09 01/17/2021   BUN 22 01/17/2021   CO2 25 01/17/2021   TSH 1.82 02/26/2021   PSA 1.38 02/09/2019   HGBA1C 6.2 (A) 01/22/2021   MICROALBUR 1.7 05/31/2020    MR Cervical Spine w/o contrast  Result Date: 03/04/2021 CLINICAL DATA:  Spinal stenosis. Cervical radiculopathy. Progressive neck pain bilateral upper extremity radiculopathy. Right weakness. EXAM: MRI CERVICAL SPINE WITHOUT CONTRAST TECHNIQUE: Multiplanar, multisequence MR imaging of the cervical spine was performed. No intravenous contrast was administered. COMPARISON:  MRI the cervical spine 07/23/2019 FINDINGS: Alignment: No significant listhesis is present. Mild straightening of the normal cervical lordosis is stable. Vertebrae: Minimal endplate degenerative changes are present at C5-6 and C6-7. Marrow signal and vertebral body heights are otherwise normal. Cord: Normal signal and morphology.  Posterior Fossa, vertebral arteries, paraspinal tissues: Craniocervical junction is normal. Flow is present in the vertebral arteries bilaterally. Visualized intracranial contents are normal. Disc levels: C1-2: Negative. C2-3: Negative. C3-4: A leftward disc osteophyte complex is again noted. Moderate left and mild right foraminal narrowing is stable. Partial effacement ventral CSF noted. C4-5: Mild foraminal narrowing bilaterally is stable, secondary to uncovertebral spurring. C5-6: Leftward disc osteophyte complex present. Severe left and moderate right foraminal narrowing is present. Partial effacement of the ventral CSF again noted. C6-7: Leftward disc osteophyte complex present. Uncovertebral and facet hypertrophy result in stable severe left and moderate right foraminal narrowing. Partial effacement of ventral CSF is noted. C7-T1: Negative. IMPRESSION: 1. Stable multilevel spondylosis of the cervical spine. 2. Stable moderate left and mild right foraminal narrowing at C3-4. 3. Stable severe left and moderate right foraminal stenosis at C5-6 and C6-7. 4. Mild foraminal narrowing bilaterally at C4-5. Electronically Signed   By: San Morelle M.D.   On: 03/04/2021 11:16     Assessment & Plan:  Plan  I have discontinued Trino D. Conteh "Dale"'s omeprazole and oxyCODONE-acetaminophen. I have also changed his sertraline. Additionally, I am having him start on oxyCODONE-acetaminophen and pantoprazole. Lastly, I am having him maintain his sennosides-docusate sodium, tiZANidine, ondansetron, fenofibrate, gabapentin, nitroGLYCERIN, aspirin EC, ticagrelor, metFORMIN, lisinopril, traZODone, rosuvastatin, Belsomra, ranolazine, OneTouch Verio, glimepiride, furosemide, and potassium chloride SA. We administered cyanocobalamin.  Meds ordered this encounter  Medications  . oxyCODONE-acetaminophen (PERCOCET/ROXICET) 5-325 MG tablet    Sig: Take 1 tablet by mouth every 4 (four) hours as needed for severe pain.     Dispense:  120 tablet    Refill:  0  . pantoprazole (PROTONIX) 40 MG tablet    Sig: Take 1 tablet (40 mg total) by mouth daily.    Dispense:  30 tablet    Refill:  3  . sertraline (ZOLOFT) 100 MG tablet    Sig: 1 1/2 po qd    Dispense:  135 tablet    Refill:  3  . cyanocobalamin ((VITAMIN B-12)) injection 1,000 mcg    Problem List Items Addressed This Visit  Unprioritized   Coronary artery disease involving native coronary artery of native heart with angina pectoris ([place]) (Chronic)    Per cardiology Check labs today       Depression   Relevant Medications   sertraline (ZOLOFT) 100 MG tablet   Essential hypertension (Chronic)    Well controlled, no changes to meds. Encouraged heart healthy diet such as the DASH diet and exercise as tolerated.        Hyperlipidemia   Hyperlipidemia associated with type 2 diabetes mellitus (HCC) (Chronic)    Encouraged heart healthy diet, increase exercise, avoid trans fats, consider a krill oil cap daily      Hypertension - Primary   Uncontrolled type 2 diabetes mellitus with hyperglycemia (HCC)    Per endo        Other Visit Diagnoses    Other chronic pain       Relevant Medications   oxyCODONE-acetaminophen (PERCOCET/ROXICET) 5-325 MG tablet   sertraline (ZOLOFT) 100 MG tablet   cyanocobalamin ((VITAMIN B-12)) injection 1,000 mcg (Completed)   Other Relevant Orders   Ambulatory referral to Gastroenterology   Nausea       Relevant Medications   pantoprazole (PROTONIX) 40 MG tablet   Other Relevant Orders   Ambulatory referral to Gastroenterology   Dyspepsia       Relevant Medications   pantoprazole (PROTONIX) 40 MG tablet   cyanocobalamin ((VITAMIN B-12)) injection 1,000 mcg (Completed)   Other Relevant Orders   Ambulatory referral to Gastroenterology   High risk medication use       Relevant Orders   DRUG MONITORING, PANEL 8 WITH CONFIRMATION, URINE      Follow-up: Return in about 3 months (around 06/07/2021),  or if symptoms worsen or fail to improve, for chronic pain .  [name], DO

## 2021-03-07 NOTE — Assessment & Plan Note (Signed)
Well controlled, no changes to meds. Encouraged heart healthy diet such as the DASH diet and exercise as tolerated.  °

## 2021-03-07 NOTE — Assessment & Plan Note (Signed)
Per cardiology Check labs today

## 2021-03-07 NOTE — Assessment & Plan Note (Signed)
Encouraged heart healthy diet, increase exercise, avoid trans fats, consider a krill oil cap daily 

## 2021-03-11 LAB — DRUG MONITORING, PANEL 8 WITH CONFIRMATION, URINE
6 Acetylmorphine: NEGATIVE ng/mL (ref <10)
Alcohol Metabolites: NEGATIVE ng/mL
Amphetamines: NEGATIVE ng/mL (ref <500)
Benzodiazepines: NEGATIVE ng/mL (ref <100)
Buprenorphine, Urine: NEGATIVE ng/mL (ref <5)
Cocaine Metabolite: NEGATIVE ng/mL (ref <150)
Codeine: NEGATIVE ng/mL (ref <50)
Creatinine: 112.6 mg/dL
Hydrocodone: NEGATIVE ng/mL (ref <50)
Hydromorphone: NEGATIVE ng/mL (ref <50)
MDA: NEGATIVE ng/mL (ref <200)
MDMA: NEGATIVE ng/mL (ref <200)
MDMA: NEGATIVE ng/mL (ref <500)
Marijuana Metabolite: NEGATIVE ng/mL (ref <20)
Marijuana Metabolite: NEGATIVE ng/mL (ref <5)
Morphine: NEGATIVE ng/mL (ref <50)
Norhydrocodone: NEGATIVE ng/mL (ref <50)
Noroxycodone: 1536 ng/mL — ABNORMAL HIGH (ref <50)
Opiates: NEGATIVE ng/mL (ref <100)
Oxidant: NEGATIVE ug/mL
Oxycodone: 1369 ng/mL — ABNORMAL HIGH (ref <50)
Oxycodone: POSITIVE ng/mL — AB (ref <100)
Oxymorphone: 453 ng/mL — ABNORMAL HIGH (ref <50)
pH: 5.8 (ref 4.5–9.0)

## 2021-03-11 LAB — DM TEMPLATE

## 2021-03-12 ENCOUNTER — Ambulatory Visit: Payer: Medicare HMO | Admitting: Orthopaedic Surgery

## 2021-03-12 VITALS — BP 120/72 | HR 61 | Ht 72.0 in | Wt 199.0 lb

## 2021-03-12 DIAGNOSIS — G5603 Carpal tunnel syndrome, bilateral upper limbs: Secondary | ICD-10-CM

## 2021-03-12 DIAGNOSIS — M4802 Spinal stenosis, cervical region: Secondary | ICD-10-CM

## 2021-03-14 ENCOUNTER — Other Ambulatory Visit: Payer: Self-pay | Admitting: Family Medicine

## 2021-03-14 DIAGNOSIS — R110 Nausea: Secondary | ICD-10-CM

## 2021-03-14 NOTE — Progress Notes (Signed)
Office Visit Note   Patient: Ronald Petersen           Date of Birth: [birth year]           MRN: [id number] Visit Date: 03/12/2021              Requested by: [address], [place], [place] Ronald Petersen,  Ronald Petersen PCP: Ronald Petersen, Ronald Apa, DO   Assessment & Plan: Visit Diagnoses:  1. Foraminal stenosis of cervical region   2. Carpal tunnel syndrome, bilateral     Plan: Patient can get back in touch with [place] once he is cleared by cardiology.  If he develops myelopathic symptoms or progressive upper extremity's weakness he can let us know.  Outlined plan was cervical fusion with carpal tunnel release 1 hand and then he could later decide about having the other hand done as an outpatient several months later.  Follow-Up Instructions: No follow-ups on file.   Orders:  No orders of the defined types were placed in this encounter.  No orders of the defined types were placed in this encounter.     Procedures: No procedures performed   Clinical Data: No additional findings.   Subjective: Chief Complaint  Patient presents with  . Neck - Follow-up    MRI Review    HPI 65 year old male here with his wife with ongoing problems with cervical spondylosis C5-6 and C6-7.  Last saw him over a year ago at that time we discussed two-level cervical fusion for severe foraminal stenosis worse on the left than the right consistent with the symptoms.  Unfortunately in the interim he started having chest pain and up having stent placed in November 2021 and now is not able to have any elective surgery until he is cleared likely in November 2022.  Patient has diabetes.  Last A1c was 6.2.  Review of Systems positive hypertension hyperlipidemia coronary artery disease, stent November 2021, cervical spondylosis severe foraminal stenosis, peripheral neuropathy, diabetes type 2 otherwise noncontributory HPI.   Objective: Vital Signs: BP 120/72 (BP Location: Left Arm,  Patient Position: Sitting)   Pulse 61   Ht 6' (1.829 m)   Wt 199 lb (90.3 kg)   BMI 26.99 kg/m   Physical Exam Constitutional:      Appearance: He is well-developed.  HENT:     Head: Normocephalic and atraumatic.  Eyes:     Pupils: Pupils are equal, round, and reactive to light.  Neck:     Thyroid: No thyromegaly.     Trachea: No tracheal deviation.  Cardiovascular:     Rate and Rhythm: Normal rate.  Pulmonary:     Effort: Pulmonary effort is normal.     Breath sounds: No wheezing.  Abdominal:     General: Bowel sounds are normal.     Palpations: Abdomen is soft.  Skin:    General: Skin is warm and dry.     Capillary Refill: Capillary refill takes less than 2 seconds.  Neurological:     Mental Status: He is alert and oriented to person, place, and time.  Psychiatric:        Behavior: Behavior normal.        Thought Content: Thought content normal.        Judgment: Judgment normal.     Ortho Exam pot Spurling worse on left than right.  Left brachial plexus tenderness.  Upper semireflexes are 2+ and symmetrical normal heel toe gait.  Positive  Phalen's test both right and left.  No lower extremity clonus.  Normal gait sequence.  Normal hip range of motion.  Specialty Comments:  No specialty comments available.  Imaging: No results found.   PMFS History: Patient Active Problem List   Diagnosis Date Noted  . Type 2 diabetes mellitus with hyperglycemia, without long-term current use of insulin ([place]) 01/22/2021  . Type 2 diabetes mellitus with diabetic polyneuropathy, without long-term current use of insulin ([place]) 01/22/2021  . Metabolic syndrome 27/25/3664  . Coronary artery disease 01/17/2021  . Anxiety   . Arthritis   . Cancer ([place])   . Complication of anesthesia   . GERD (gastroesophageal reflux disease)   . Hyperlipidemia   . Hypertension   . Neuromuscular disorder ([place])   . Sleep apnea   . Coronary artery disease involving native coronary artery of native  heart with angina pectoris ([place]) 11/20/2020  . CAD S/P DES PCI LAD & LCx-OM 11/20/2020  . Unstable angina ([place]) 11/19/2020  . Abnormal cardiac CT angiography 11/19/2020  . Shortness of breath 10/02/2020  . Dizziness 10/02/2020  . Chest pain 10/02/2020  . Obesity (BMI 30-39.9) 10/02/2020  . Type 2 diabetes mellitus with diabetic neuropathy, without long-term current use of insulin ([place]) 06/06/2020  . Uncontrolled type 2 diabetes mellitus with hyperglycemia ([place]) 05/31/2020  . Blurry vision 02/07/2020  . Spinal stenosis of lumbar region 02/07/2020  . Frequent falls 02/07/2020  . Foraminal stenosis of cervical region 07/26/2019  . Aortic atherosclerosis ([place]) 05/27/2019  . Chronic bilateral low back pain without sciatica 05/20/2019  . Carpal tunnel syndrome, bilateral 05/20/2019  . Leg weakness, bilateral 01/05/2019  . Falling episodes 01/05/2019  . Right rotator cuff tear 11/23/2018  . Neck pain 11/04/2018  . Acute pain of right shoulder 11/04/2018  . Gastroesophageal reflux disease 11/04/2018  . Hyperlipidemia associated with type 2 diabetes mellitus ([place]) 11/04/2018  . 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 Essential hypertension 09/14/2017  . Preventative health care 06/12/2016  . OSA (obstructive sleep apnea) 04/07/2016  . Joint pain 04/05/2016  . NSAID long-term use 12/13/2015  . Nausea without vomiting 12/13/2015  . History of colonic polyps 12/13/2015  . Loss of weight 12/13/2015  . Depression 11/06/2015  . Metatarsal deformity 02/20/2015  . Equinus deformity of foot, acquired 02/20/2015  . Plantar fasciitis, bilateral 02/15/2015  . Left wrist injury 06/15/2014  . Peripheral neuropathy 04/01/2012  . Fatigue 11/05/2010  . OTHER SPECIFIED DISORDER OF PENIS 03/26/2010  . NAUSEA 03/26/2010  . Pain in limb 10/17/2008  . NECK PAIN, CHRONIC 11/24/2007  . DM (diabetes mellitus) type II uncontrolled, periph vascular disorder ([place])  01/27/2007  . Hyperlipidemia LDL goal <70 01/27/2007   Past Medical History:  Diagnosis Date  . Anxiety   . Arthritis   . Cancer ([place])   . Complication of anesthesia    aspirated with back surgery at age 16  . Depression   . Diabetes mellitus   . GERD (gastroesophageal reflux disease)   . Hyperlipidemia   . Hypertension   . Neuromuscular disorder ([place])    NEUROPATHY  . Right rotator cuff tear 11/23/2018  . Sleep apnea    no CPAP    Family History  Problem Relation Age of Onset  . Ovarian cancer Sister   . Stomach cancer Sister   . Heart disease Sister        MI  . Heart disease Sister  MI  . Alcohol abuse Other   . Depression Other   . Arthritis Other   . Hypertension Other   . Stomach cancer Maternal Grandmother   . COPD Mother   . Stroke Father   . Coronary artery disease Other   . Ovarian cancer Other        neice  . Ovarian cancer Other        neice  . Colon cancer Other     Past Surgical History:  Procedure Laterality Date  . APPENDECTOMY    . ARTHOSCOPIC ROTAOR CUFF REPAIR Right 11/23/2018   Procedure: ARTHROSCOPIC ROTATOR CUFF REPAIR;  Surgeon: [name], MD;  Location: [facility];  Service: Orthopedics;  Laterality: Right;  . CHOLECYSTECTOMY    . CORONARY STENT INTERVENTION N/A 11/19/2020   Procedure: CORONARY STENT INTERVENTION;  Surgeon: Nelva Bush, MD;  Location: [facility];  Service: Cardiovascular;  Laterality: N/A;  . INTRAVASCULAR ULTRASOUND/IVUS N/A 11/19/2020   Procedure: Intravascular Ultrasound/IVUS;  Surgeon: Nelva Bush, MD;  Location: [facility];  Service: Cardiovascular;  Laterality: N/A;  . LEFT HEART CATH AND CORONARY ANGIOGRAPHY N/A 11/19/2020   Procedure: LEFT HEART CATH AND CORONARY ANGIOGRAPHY;  Surgeon: Nelva Bush, MD;  Location: [facility];  Service: Cardiovascular;  Laterality: N/A;  . LEFT HEART CATH AND CORONARY ANGIOGRAPHY N/A 12/24/2020   Procedure: LEFT HEART  CATH AND CORONARY ANGIOGRAPHY;  Surgeon: Martinique, Peter M, MD;  Location: [facility];  Service: Cardiovascular;  Laterality: N/A;  . LUMBAR LAMINECTOMY    . SHOULDER ARTHROSCOPY WITH ROTATOR CUFF REPAIR AND SUBACROMIAL DECOMPRESSION Right 11/23/2018   Procedure: SHOULDER ARTHROSCOPY WITH ROTATOR CUFF REPAIR AND SUBACROMIAL DECOMPRESSION;  Surgeon: [name], MD;  Location: [facility];  Service: Orthopedics;  Laterality: Right;   Social History   Occupational History  . Occupation: self employed    Fish farm manager: UNEMPLOYED  Tobacco Use  . Smoking status: Former Smoker    Years: 16.00    Types: Cigarettes    Quit date: 12/29/1985    Years since quitting: 35.2  . Smokeless tobacco: Never Used  Vaping Use  . Vaping Use: Never used  Substance and Sexual Activity  . Alcohol use: Yes    Alcohol/week: 4.0 - 5.0 standard drinks    Types: 4 - 5 Cans of beer per week    Comment: 5 times per week  . Drug use: No  . Sexual activity: Yes    Partners: Female

## 2021-03-18 ENCOUNTER — Other Ambulatory Visit: Payer: Self-pay

## 2021-03-18 ENCOUNTER — Ambulatory Visit
Admission: RE | Admit: 2021-03-18 | Discharge: 2021-03-18 | Disposition: A | Discharge status: Home / Self Care | Payer: Medicare HMO | Source: Ambulatory Visit | Attending: Neurology | Admitting: Neurology

## 2021-03-18 DIAGNOSIS — G319 Degenerative disease of nervous system, unspecified: Secondary | ICD-10-CM | POA: Diagnosis not present

## 2021-03-18 DIAGNOSIS — G9389 Other specified disorders of brain: Secondary | ICD-10-CM | POA: Diagnosis not present

## 2021-03-18 DIAGNOSIS — R531 Weakness: Secondary | ICD-10-CM | POA: Diagnosis not present

## 2021-03-18 DIAGNOSIS — R413 Other amnesia: Secondary | ICD-10-CM | POA: Diagnosis not present

## 2021-03-19 ENCOUNTER — Telehealth: Payer: Self-pay | Admitting: Neurology

## 2021-03-19 NOTE — Telephone Encounter (Signed)
Patient called in and left a message returning our call

## 2021-03-19 NOTE — Telephone Encounter (Signed)
called

## 2021-03-19 NOTE — Telephone Encounter (Signed)
Patient called for MRI results

## 2021-03-23 ENCOUNTER — Encounter (HOSPITAL_BASED_OUTPATIENT_CLINIC_OR_DEPARTMENT_OTHER): Payer: Self-pay | Admitting: Cardiovascular Disease

## 2021-03-23 NOTE — Procedures (Signed)
Patient Name: Ronald Petersen, Ronald Petersen Date: 03/02/2021 Gender: Male D.O.B: [birth year] Age (years): 56 Referring Provider: Godfrey Pick Tobb DO Height (inches): 72 Interpreting Physician: Shelva Majestic MD, ABSM Weight (lbs): 195 RPSGT: Earney [place] BMI: 26 MRN: [id number] Neck Size: 18.00  CLINICAL INFORMATION Sleep Study Type: NPSG  Indication for sleep study: snoring, excessive daytime sleepiness, h/o OSA  Epworth Sleepiness Score: 12  SLEEP STUDY TECHNIQUE As per the AASM Manual for the Scoring of Sleep and Associated Events v2.3 (April 2016) with a hypopnea requiring 4% desaturations.  The channels recorded and monitored were frontal, central and occipital EEG, electrooculogram (EOG), submentalis EMG (chin), nasal and oral airflow, thoracic and abdominal wall motion, anterior tibialis EMG, snore microphone, electrocardiogram, and pulse oximetry.  MEDICATIONS aspirin EC 81 MG tablet fenofibrate 160 MG tablet furosemide (LASIX) 40 MG tablet gabapentin (NEURONTIN) 800 MG tablet glimepiride ([name]) 2 MG tablet glucose blood (ONETOUCH VERIO) test strip lisinopril (ZESTRIL) 5 MG tablet metFORMIN ([name]) 500 MG 24 hr tablet nitroGLYCERIN (NITROSTAT) 0.4 MG SL tablet(Expired) ondansetron (ZOFRAN) 4 MG tablet oxyCODONE-acetaminophen (PERCOCET/ROXICET) 5-325 MG tablet pantoprazole (PROTONIX) 40 MG tablet potassium chloride SA (KLOR-CON) 20 MEQ tablet ranolazine (RANEXA) 1000 MG SR tablet rosuvastatin (CRESTOR) 40 MG tablet(Expired) sennosides-docusate sodium (SENOKOT-S) 8.6-50 MG tablet sertraline (ZOLOFT) 100 MG tablet Suvorexant (BELSOMRA) 10 MG TABS ticagrelor (BRILINTA) 90 MG TABS tablet tiZANidine (ZANAFLEX) 4 MG tablet traZODone (DESYREL) 50 MG tablet Medications self-administered by patient taken the night of the study : TRAZODONE, tizonidine, sertrline, ACETAMINOPHEN W/OXYCODONE, GABAPENTIN  SLEEP ARCHITECTURE The study was initiated at 10:41:23 PM  and ended at 4:48:10 AM.  Sleep onset time was 38.0 minutes and the sleep efficiency was 79.8%%. The total sleep time was 292.8 minutes.  Stage REM latency was 207.0 minutes.  The patient spent 0.9%% of the night in stage N1 sleep, 79.9%% in stage N2 sleep, 12.5%% in stage N3 and 6.8% in REM.  Alpha intrusion was absent.  Supine sleep was 24.12%.  RESPIRATORY PARAMETERS The overall apnea/hypopnea index (AHI) was 8.2 per hour. The respiratory disturbance index (RDI) was 8.8/h. There were 2 total apneas, including 2 obstructive, 0 central and 0 mixed apneas. There were 38 hypopneas and 3 RERAs.  The AHI during Stage REM sleep was 0.0 per hour.  AHI while supine was 28.0 per hour.  The mean oxygen saturation was 92.7%. The minimum SpO2 during sleep was 87.0%.  Loud snoring was noted during this study.  CARDIAC DATA The 2 lead EKG demonstrated sinus rhythm. The mean heart rate was 56.2 beats per minute. Other EKG findings include: None.  LEG MOVEMENT DATA The total PLMS were 0 with a resulting PLMS index of 0.0. Associated arousal with leg movement index was 0.0 .  IMPRESSIONS - Mild obstructive sleep apnea occurred during this study (AHI 8.2/h; RDI 8.8/h). - Mild oxygen desaturation to a nadir of 87%. - The patient snored with loud snoring volume. - Abnormal sleep architecture with prolonged latency to REM sleep and reduction in REM sleep. - No cardiac abnormalities were noted during this study. - Clinically significant periodic limb movements did not occur during sleep. No significant associated arousals.  DIAGNOSIS - Obstructive Sleep Apnea (G47.33) - Nocturnal Hypoxemia (G47.36)  RECOMMENDATIONS - Therapeutic CPAP titration to determine optimal pressure required to alleviate sleep disordered breathing. - Effort should be made to optimize nasal and oropharyngeal patency. - Positional therapy avoiding supine position during sleep. - Avoid alcohol, sedatives and other CNS  depressants that may worsen sleep apnea and  disrupt normal sleep architecture. - Sleep hygiene should be reviewed to assess factors that may improve sleep quality. - Weight management and regular exercise should be initiated or continued if appropriate.  [Electronically signed] 03/23/2021 01:30 PM  Shelva Majestic MD, [facility], [place], American Board of Sleep Medicine   NPI: [id number] [place] PH: [phone]   FX: [phone] [place]

## 2021-03-25 MED FILL — RANOLAZINE ER 1000 MG TB12: 1000 | 30 days supply | Qty: 60 | Fill #2

## 2021-03-26 ENCOUNTER — Other Ambulatory Visit: Payer: Self-pay | Admitting: Cardiovascular Disease

## 2021-03-26 DIAGNOSIS — G4733 Obstructive sleep apnea (adult) (pediatric): Secondary | ICD-10-CM

## 2021-03-26 DIAGNOSIS — G4736 Sleep related hypoventilation in conditions classified elsewhere: Secondary | ICD-10-CM

## 2021-03-30 ENCOUNTER — Other Ambulatory Visit (HOSPITAL_BASED_OUTPATIENT_CLINIC_OR_DEPARTMENT_OTHER): Payer: Self-pay

## 2021-04-03 ENCOUNTER — Other Ambulatory Visit: Payer: Self-pay | Admitting: Cardiovascular Disease

## 2021-04-03 ENCOUNTER — Telehealth: Payer: Self-pay | Admitting: Cardiovascular Disease

## 2021-04-03 DIAGNOSIS — G4736 Sleep related hypoventilation in conditions classified elsewhere: Secondary | ICD-10-CM

## 2021-04-03 DIAGNOSIS — G4733 Obstructive sleep apnea (adult) (pediatric): Secondary | ICD-10-CM

## 2021-04-03 NOTE — Telephone Encounter (Signed)
Patient notified of sleep study results and recommendations. He agrees to proceed with the titration study. States he has a hx of OSA and used a CPAP machine years ago.

## 2021-04-05 ENCOUNTER — Telehealth: Payer: Self-pay | Admitting: *Deleted

## 2021-04-05 NOTE — Telephone Encounter (Signed)
Patient notified of CPAP titration appointment.

## 2021-04-11 ENCOUNTER — Other Ambulatory Visit (HOSPITAL_BASED_OUTPATIENT_CLINIC_OR_DEPARTMENT_OTHER): Payer: Self-pay

## 2021-04-16 ENCOUNTER — Other Ambulatory Visit (HOSPITAL_BASED_OUTPATIENT_CLINIC_OR_DEPARTMENT_OTHER): Payer: Self-pay

## 2021-04-16 MED FILL — Glimepiride Tab 2 MG: ORAL | 90 days supply | Qty: 90 | Fill #0 | Status: AC

## 2021-04-16 MED FILL — Pantoprazole Sodium EC Tab 40 MG (Base Equiv): ORAL | 30 days supply | Qty: 30 | Fill #0 | Status: AC

## 2021-04-16 MED FILL — Ondansetron HCl Tab 4 MG: ORAL | 6 days supply | Qty: 20 | Fill #0 | Status: AC

## 2021-04-16 MED FILL — Lisinopril Tab 5 MG: ORAL | 90 days supply | Qty: 90 | Fill #0 | Status: AC

## 2021-04-16 MED FILL — Trazodone HCl Tab 50 MG: ORAL | 30 days supply | Qty: 30 | Fill #0 | Status: AC

## 2021-04-19 ENCOUNTER — Other Ambulatory Visit: Payer: Self-pay | Admitting: Family Medicine

## 2021-04-19 ENCOUNTER — Other Ambulatory Visit (HOSPITAL_BASED_OUTPATIENT_CLINIC_OR_DEPARTMENT_OTHER): Payer: Self-pay

## 2021-04-19 DIAGNOSIS — G8929 Other chronic pain: Secondary | ICD-10-CM

## 2021-04-19 NOTE — Telephone Encounter (Signed)
Patient is requesting a refill of the following medications: Requested Prescriptions   Pending Prescriptions Disp Refills  . oxyCODONE-acetaminophen (PERCOCET/ROXICET) 5-325 MG tablet 120 tablet 0    Sig: TAKE 1 TABLET BY MOUTH EVERY 4 (FOUR) HOURS AS NEEDED FOR SEVERE PAIN.    Date of patient request: 04/19/21 Last office visit: 03/07/21 Date of last refill: 03/07/21 Last refill amount: 120 + 0 Follow up time period per chart: 06/07/21

## 2021-04-22 ENCOUNTER — Other Ambulatory Visit: Payer: Self-pay | Admitting: Family Medicine

## 2021-04-22 ENCOUNTER — Other Ambulatory Visit (HOSPITAL_BASED_OUTPATIENT_CLINIC_OR_DEPARTMENT_OTHER): Payer: Self-pay

## 2021-04-22 DIAGNOSIS — G8929 Other chronic pain: Secondary | ICD-10-CM

## 2021-04-22 DIAGNOSIS — K219 Gastro-esophageal reflux disease without esophagitis: Secondary | ICD-10-CM

## 2021-04-22 MED FILL — Ranolazine Tab ER 12HR 1000 MG: ORAL | 30 days supply | Qty: 60 | Fill #0 | Status: AC

## 2021-04-23 ENCOUNTER — Other Ambulatory Visit (HOSPITAL_BASED_OUTPATIENT_CLINIC_OR_DEPARTMENT_OTHER): Payer: Self-pay

## 2021-04-23 MED ORDER — OXYCODONE-ACETAMINOPHEN 5-325 MG PO TABS
ORAL_TABLET | ORAL | 0 refills | Status: DC
  Filled 2021-04-23: qty 120, 20d supply, fill #0

## 2021-04-23 NOTE — Telephone Encounter (Signed)
Requesting: oxycodone Contract: will get at next visit UDS: 03/07/21 Last Visit: 03/07/21 Next Visit: 06/07/21 Last Refill: 03/07/21  Please Advise

## 2021-04-25 ENCOUNTER — Emergency Department (HOSPITAL_BASED_OUTPATIENT_CLINIC_OR_DEPARTMENT_OTHER): Payer: Medicare HMO

## 2021-04-25 ENCOUNTER — Emergency Department (HOSPITAL_BASED_OUTPATIENT_CLINIC_OR_DEPARTMENT_OTHER)
Admission: EM | Admit: 2021-04-25 | Discharge: 2021-04-25 | Disposition: A | Discharge status: Home / Self Care | Payer: Medicare HMO | Attending: Emergency Medicine | Admitting: Emergency Medicine

## 2021-04-25 ENCOUNTER — Encounter (HOSPITAL_BASED_OUTPATIENT_CLINIC_OR_DEPARTMENT_OTHER): Payer: Self-pay

## 2021-04-25 ENCOUNTER — Encounter: Payer: Self-pay | Admitting: Family

## 2021-04-25 ENCOUNTER — Other Ambulatory Visit: Payer: Self-pay

## 2021-04-25 ENCOUNTER — Ambulatory Visit (INDEPENDENT_AMBULATORY_CARE_PROVIDER_SITE_OTHER): Payer: Medicare HMO | Admitting: Family

## 2021-04-25 VITALS — BP 110/60 | HR 64 | Temp 97.1°F | Resp 15 | Ht 72.0 in | Wt 190.0 lb

## 2021-04-25 DIAGNOSIS — Z87891 Personal history of nicotine dependence: Secondary | ICD-10-CM | POA: Insufficient documentation

## 2021-04-25 DIAGNOSIS — R21 Rash and other nonspecific skin eruption: Secondary | ICD-10-CM | POA: Diagnosis not present

## 2021-04-25 DIAGNOSIS — Z7984 Long term (current) use of oral hypoglycemic drugs: Secondary | ICD-10-CM | POA: Insufficient documentation

## 2021-04-25 DIAGNOSIS — K219 Gastro-esophageal reflux disease without esophagitis: Secondary | ICD-10-CM | POA: Insufficient documentation

## 2021-04-25 DIAGNOSIS — I2510 Atherosclerotic heart disease of native coronary artery without angina pectoris: Secondary | ICD-10-CM | POA: Diagnosis not present

## 2021-04-25 DIAGNOSIS — R1013 Epigastric pain: Secondary | ICD-10-CM | POA: Diagnosis not present

## 2021-04-25 DIAGNOSIS — R109 Unspecified abdominal pain: Secondary | ICD-10-CM

## 2021-04-25 DIAGNOSIS — R079 Chest pain, unspecified: Secondary | ICD-10-CM | POA: Diagnosis not present

## 2021-04-25 DIAGNOSIS — I700 Atherosclerosis of aorta: Secondary | ICD-10-CM | POA: Diagnosis not present

## 2021-04-25 DIAGNOSIS — Z7982 Long term (current) use of aspirin: Secondary | ICD-10-CM | POA: Insufficient documentation

## 2021-04-25 DIAGNOSIS — R1030 Lower abdominal pain, unspecified: Secondary | ICD-10-CM | POA: Diagnosis not present

## 2021-04-25 DIAGNOSIS — I119 Hypertensive heart disease without heart failure: Secondary | ICD-10-CM | POA: Diagnosis not present

## 2021-04-25 DIAGNOSIS — R110 Nausea: Secondary | ICD-10-CM

## 2021-04-25 DIAGNOSIS — R072 Precordial pain: Secondary | ICD-10-CM | POA: Diagnosis not present

## 2021-04-25 DIAGNOSIS — R0602 Shortness of breath: Secondary | ICD-10-CM | POA: Diagnosis not present

## 2021-04-25 DIAGNOSIS — Z79899 Other long term (current) drug therapy: Secondary | ICD-10-CM | POA: Insufficient documentation

## 2021-04-25 DIAGNOSIS — R634 Abnormal weight loss: Secondary | ICD-10-CM

## 2021-04-25 DIAGNOSIS — E1142 Type 2 diabetes mellitus with diabetic polyneuropathy: Secondary | ICD-10-CM | POA: Insufficient documentation

## 2021-04-25 DIAGNOSIS — R0789 Other chest pain: Secondary | ICD-10-CM | POA: Diagnosis not present

## 2021-04-25 LAB — CBC WITH DIFFERENTIAL/PLATELET
Abs Immature Granulocytes: 0.02 10*3/uL (ref 0.00–0.07)
Basophils Absolute: 0 10*3/uL (ref 0.0–0.1)
Basophils Relative: 0 %
Eosinophils Absolute: 0.1 10*3/uL (ref 0.0–0.5)
Eosinophils Relative: 2 %
HCT: 40.9 % (ref 39.0–52.0)
Hemoglobin: 13.9 g/dL (ref 13.0–17.0)
Immature Granulocytes: 0 %
Lymphocytes Relative: 29 %
Lymphs Abs: 1.9 10*3/uL (ref 0.7–4.0)
MCH: 28.9 pg (ref 26.0–34.0)
MCHC: 34 g/dL (ref 30.0–36.0)
MCV: 85 fL (ref 80.0–100.0)
Monocytes Absolute: 0.6 10*3/uL (ref 0.1–1.0)
Monocytes Relative: 9 %
Neutro Abs: 3.9 10*3/uL (ref 1.7–7.7)
Neutrophils Relative %: 60 %
Platelets: 197 10*3/uL (ref 150–400)
RBC: 4.81 MIL/uL (ref 4.22–5.81)
RDW: 13.1 % (ref 11.5–15.5)
WBC: 6.5 10*3/uL (ref 4.0–10.5)
nRBC: 0 % (ref 0.0–0.2)

## 2021-04-25 LAB — COMPREHENSIVE METABOLIC PANEL
ALT: 16 U/L (ref 0–44)
AST: 20 U/L (ref 15–41)
Albumin: 4.4 g/dL (ref 3.5–5.0)
Alkaline Phosphatase: 47 U/L (ref 38–126)
Anion gap: 10 (ref 5–15)
BUN: 17 mg/dL (ref 8–23)
CO2: 24 mmol/L (ref 22–32)
Calcium: 9.6 mg/dL (ref 8.9–10.3)
Chloride: 100 mmol/L (ref 98–111)
Creatinine, Ser: 0.81 mg/dL (ref 0.61–1.24)
GFR, Estimated: >60 mL/min (ref >60)
Glucose, Bld: 89 mg/dL (ref 70–99)
Potassium: 3.7 mmol/L (ref 3.5–5.1)
Sodium: 134 mmol/L — ABNORMAL LOW (ref 135–145)
Total Bilirubin: 0.4 mg/dL (ref 0.3–1.2)
Total Protein: 7.6 g/dL (ref 6.5–8.1)

## 2021-04-25 LAB — URINALYSIS, MICROSCOPIC (REFLEX): WBC, UA: NONE SEEN WBC/hpf (ref 0–5)

## 2021-04-25 LAB — URINALYSIS, ROUTINE W REFLEX MICROSCOPIC
Bilirubin Urine: NEGATIVE
Glucose, UA: NEGATIVE mg/dL
Ketones, ur: NEGATIVE mg/dL
Leukocytes,Ua: NEGATIVE
Nitrite: NEGATIVE
Protein, ur: NEGATIVE mg/dL
Specific Gravity, Urine: <1.005 — ABNORMAL LOW (ref 1.005–1.030)
pH: 8.5 — ABNORMAL HIGH (ref 5.0–8.0)

## 2021-04-25 LAB — TROPONIN I (HIGH SENSITIVITY)
Troponin I (High Sensitivity): 2 ng/L (ref <18)
Troponin I (High Sensitivity): 2 ng/L (ref <18)

## 2021-04-25 LAB — LIPASE, BLOOD: Lipase: 27 U/L (ref 11–51)

## 2021-04-25 MED ORDER — HYDROMORPHONE HCL 1 MG/ML IJ SOLN
0.5000 mg | Freq: Once | INTRAMUSCULAR | Status: AC
  Administered 2021-04-25: 0.5 mg via INTRAVENOUS
  Filled 2021-04-25: qty 1

## 2021-04-25 MED ORDER — IOHEXOL 300 MG/ML  SOLN
100.0000 mL | Freq: Once | INTRAMUSCULAR | Status: AC | PRN
  Administered 2021-04-25: 100 mL via INTRAVENOUS

## 2021-04-25 MED ORDER — SODIUM CHLORIDE 0.9 % IV SOLN: INTRAVENOUS | Status: DC

## 2021-04-25 MED ORDER — ONDANSETRON HCL 4 MG/2ML IJ SOLN
4.0000 mg | Freq: Once | INTRAMUSCULAR | Status: AC
  Administered 2021-04-25: 4 mg via INTRAVENOUS
  Filled 2021-04-25: qty 2

## 2021-04-25 NOTE — Progress Notes (Signed)
Ronald Petersen is a 65 y.o. male with the following history as recorded in EpicCare:  Patient Active Problem List   Diagnosis Date Noted  . Type 2 diabetes mellitus with hyperglycemia, without long-term current use of insulin (Ronald Petersen) 01/22/2021  . Type 2 diabetes mellitus with diabetic polyneuropathy, without long-term current use of insulin (Ronald Petersen) 01/22/2021  . Metabolic syndrome 10/14/5101  . Coronary artery disease 01/17/2021  . Anxiety   . Arthritis   . Cancer ([place])   . Complication of anesthesia   . GERD (gastroesophageal reflux disease)   . Hyperlipidemia   . Hypertension   . Neuromuscular disorder (Ronald Petersen)   . Sleep apnea   . Coronary artery disease involving native coronary artery of native heart with angina pectoris ([place]) 11/20/2020  . CAD S/P DES PCI LAD & LCx-OM 11/20/2020  . Unstable angina ([place]) 11/19/2020  . Abnormal cardiac CT angiography 11/19/2020  . Shortness of breath 10/02/2020  . Dizziness 10/02/2020  . Chest pain 10/02/2020  . Obesity (BMI 30-39.9) 10/02/2020  . Type 2 diabetes mellitus with diabetic neuropathy, without long-term current use of insulin (Ronald Petersen) 06/06/2020  . Uncontrolled type 2 diabetes mellitus with hyperglycemia (Ronald Petersen) 05/31/2020  . Blurry vision 02/07/2020  . Spinal stenosis of lumbar region 02/07/2020  . Frequent falls 02/07/2020  . Foraminal stenosis of cervical region 07/26/2019  . Aortic atherosclerosis ([place]) 05/27/2019  . Chronic bilateral low back pain without sciatica 05/20/2019  . Carpal tunnel syndrome, bilateral 05/20/2019  . Leg weakness, bilateral 01/05/2019  . Falling episodes 01/05/2019  . Right rotator cuff tear 11/23/2018  . Neck pain 11/04/2018  . Acute pain of right shoulder 11/04/2018  . Gastroesophageal reflux disease 11/04/2018  . Hyperlipidemia associated with type 2 diabetes mellitus (Ronald Petersen) 11/04/2018  . 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 Essential hypertension 09/14/2017   . Preventative health care 06/12/2016  . OSA (obstructive sleep apnea) 04/07/2016  . Joint pain 04/05/2016  . NSAID long-term use 12/13/2015  . Nausea without vomiting 12/13/2015  . History of colonic polyps 12/13/2015  . Loss of weight 12/13/2015  . Depression 11/06/2015  . Metatarsal deformity 02/20/2015  . Equinus deformity of foot, acquired 02/20/2015  . Plantar fasciitis, bilateral 02/15/2015  . Left wrist injury 06/15/2014  . Peripheral neuropathy 04/01/2012  . Fatigue 11/05/2010  . OTHER SPECIFIED DISORDER OF PENIS 03/26/2010  . NAUSEA 03/26/2010  . Pain in limb 10/17/2008  . NECK PAIN, CHRONIC 11/24/2007  . DM (diabetes mellitus) type II uncontrolled, periph vascular disorder (Ronald Petersen) 01/27/2007  . Hyperlipidemia LDL goal <70 01/27/2007    Current Outpatient Medications  Medication Sig Dispense Refill  . aspirin EC 81 MG tablet Take 1 tablet (81 mg total) by mouth daily. Swallow whole. 90 tablet 3  . fenofibrate 160 MG tablet TAKE 1 TABLET (160 MG TOTAL) BY MOUTH DAILY. (Patient taking differently: Take 160 mg by mouth daily at 12 noon.) 90 tablet 1  . furosemide (LASIX) 40 MG tablet TAKE 1 TABLET (40 MG TOTAL) BY MOUTH EVERY OTHER DAY. 90 tablet 3  . gabapentin (NEURONTIN) 800 MG tablet Take 1 tablet (800 mg total) by mouth 3 (three) times daily. 270 tablet 3  . glimepiride ([name]) 2 MG tablet TAKE 1 TABLET (2 MG TOTAL) BY MOUTH DAILY BEFORE BREAKFAST. 90 tablet 3  . glucose blood test strip USE AS INSTRUCTED TO CHECK BLOOD SUGAR ONCE A DAY (Patient taking differently: USE AS INSTRUCTED TO CHECK BLOOD SUGAR ONCE  A DAY) 100 strip 12  . lisinopril (ZESTRIL) 5 MG tablet TAKE 1 TABLET (5 MG TOTAL) BY MOUTH DAILY. 90 tablet 1  . metFORMIN ([name]) 500 MG 24 hr tablet Take 2 tablets (1,000 mg total) by mouth daily with breakfast AND 1 tablet (500 mg total) daily with supper. 270 tablet 3  . metFORMIN (GLUCOPHAGE-XR) 500 MG 24 hr tablet TAKE 2 TABLETS (1,000 MG TOTAL) BY MOUTH  DAILY WITH BREAKFAST AND 1 TABLET (500 MG TOTAL) DAILY WITH SUPPER. 270 tablet 3  . nitroGLYCERIN (NITROSTAT) 0.4 MG SL tablet PLACE 1 TABLET (0.4 MG TOTAL) UNDER THE TONGUE EVERY 5 (FIVE) MINUTES AS NEEDED FOR CHEST PAIN. 90 tablet 3  . omeprazole (PRILOSEC) 40 MG capsule Take 40 mg by mouth daily.    . ondansetron (ZOFRAN) 4 MG tablet TAKE 1 TABLET BY MOUTH EVERY 8 HOURS AS NEEDED 20 tablet 2  . oxyCODONE-acetaminophen (PERCOCET/ROXICET) 5-325 MG tablet TAKE 1 TABLET BY MOUTH EVERY 4 (FOUR) HOURS AS NEEDED FOR SEVERE PAIN. 120 tablet 0  . pantoprazole (PROTONIX) 40 MG tablet TAKE 1 TABLET (40 MG TOTAL) BY MOUTH DAILY. 30 tablet 3  . potassium chloride SA (KLOR-CON) 20 MEQ tablet TAKE 1 TABLET (20 MEQ TOTAL) BY MOUTH EVERY OTHER DAY. 90 tablet 3  . ranolazine (RANEXA) 1000 MG SR tablet TAKE 1 TABLET (1,000 MG TOTAL) BY MOUTH 2 (TWO) TIMES DAILY. 60 tablet 3  . rosuvastatin (CRESTOR) 40 MG tablet TAKE 1 TABLET BY MOUTH ONCE DAILY 90 tablet 3  . sennosides-docusate sodium (SENOKOT-S) 8.6-50 MG tablet Take 2 tablets by mouth daily. (Patient taking differently: Take 1 tablet by mouth daily as needed for constipation.) 30 tablet 1  . sertraline (ZOLOFT) 100 MG tablet TAKE ONE AND ONE-HALF TABLET BY MOUTH ONCE DAILY 135 tablet 3  . Suvorexant 10 MG TABS TAKE 1 TABLET BY MOUTH AT BEDTIME AS NEEDED. MAY INCREASE TO 2 TABLETS THE NEXT NIGHT IF NEEDED 30 tablet 0  . ticagrelor (BRILINTA) 90 MG TABS tablet TAKE 1 TABLET (90 MG TOTAL) BY MOUTH TWO TIMES DAILY. 180 tablet 1  . tiZANidine (ZANAFLEX) 4 MG tablet TAKE 1 TABLET (4 MG TOTAL) BY MOUTH EVERY 6 (SIX) HOURS AS NEEDED FOR MUSCLE SPASMS. 30 tablet 1  . traZODone (DESYREL) 50 MG tablet TAKE 1/2-1 TABLET (25-50 MG TOTAL) BY MOUTH AT BEDTIME AS NEEDED FOR SLEEP. 30 tablet 3   No current facility-administered medications for this visit.    Allergies: Niacin  Past Medical History:  Diagnosis Date  . Anxiety   . Arthritis   . Cancer ([place])   .  Complication of anesthesia    aspirated with back surgery at age 62  . Depression   . Diabetes mellitus   . GERD (gastroesophageal reflux disease)   . Hyperlipidemia   . Hypertension   . Neuromuscular disorder ([place])    NEUROPATHY  . Right rotator cuff tear 11/23/2018  . Sleep apnea    no CPAP    Past Surgical History:  Procedure Laterality Date  . APPENDECTOMY    . ARTHOSCOPIC ROTAOR CUFF REPAIR Right 11/23/2018   Procedure: ARTHROSCOPIC ROTATOR CUFF REPAIR;  Surgeon: [name], MD;  Location: [place];  Service: Orthopedics;  Laterality: Right;  . CHOLECYSTECTOMY    . CORONARY STENT INTERVENTION N/A 11/19/2020   Procedure: CORONARY STENT INTERVENTION;  Surgeon: Nelva Bush, MD;  Location: [facility];  Service: Cardiovascular;  Laterality: N/A;  . INTRAVASCULAR ULTRASOUND/IVUS N/A 11/19/2020   Procedure:  Intravascular Ultrasound/IVUS;  Surgeon: Yvonne KendallEnd, Christopher, MD;  Location: [facility];  Service: Cardiovascular;  Laterality: N/A;  . LEFT HEART CATH AND CORONARY ANGIOGRAPHY N/A 11/19/2020   Procedure: LEFT HEART CATH AND CORONARY ANGIOGRAPHY;  Surgeon: Yvonne KendallEnd, Christopher, MD;  Location: [facility];  Service: Cardiovascular;  Laterality: N/A;  . LEFT HEART CATH AND CORONARY ANGIOGRAPHY N/A 12/24/2020   Procedure: LEFT HEART CATH AND CORONARY ANGIOGRAPHY;  Surgeon: SwazilandJordan, Peter M, MD;  [facility];  Service: Cardiovascular;  Laterality: N/A;  . LUMBAR LAMINECTOMY    . SHOULDER ARTHROSCOPY WITH ROTATOR CUFF REPAIR AND SUBACROMIAL DECOMPRESSION Right 11/23/2018   Procedure: SHOULDER ARTHROSCOPY WITH ROTATOR CUFF REPAIR AND SUBACROMIAL DECOMPRESSION;  Surgeon: Teryl LucyLandau, Joshua, MD;  Location: [facility];  Service: Orthopedics;  Laterality: Right;    Family History  Problem Relation Age of Onset  . Ovarian cancer Sister   . Stomach cancer Sister   . Heart disease Sister        MI  . Heart disease Sister         MI  . Alcohol abuse Other   . Depression Other   . Arthritis Other   . Hypertension Other   . Stomach cancer Maternal Grandmother   . COPD Mother   . Stroke Father   . Coronary artery disease Other   . Ovarian cancer Other        neice  . Ovarian cancer Other        neice  . Colon cancer Other     Social History   Tobacco Use  . Smoking status: Former Smoker    Years: 16.00    Types: Cigarettes    Quit date: 12/29/1985    Years since quitting: 35.3  . Smokeless tobacco: Never Used  Substance Use Topics  . Alcohol use: Yes    Alcohol/week: 4.0 - 5.0 standard drinks    Types: 4 - 5 Cans of beer per week    Comment: 5 times per week    Subjective:   Complaining of abdominal pain "for a while"- seems to be worsening in the past 2-3 days; complaining of " lot of nausea"/ notes that he can only eat a "small amount" before he starts to feel sick; notes has since developed chest pain/ shortness of breath on exertion;  No vomiting; no diarrhea; no blood in stool; unexplained weight loss- down 7 pounds in the past 2 months;  Has been having increased episodes of "hot flashes"- history of acid reflux but resolved with weight loss;  S/p cholecystectomy;  Notes that during the course of the OV he feels like his chest pain is worsening;     Objective:  Vitals:   04/25/21 1455  BP: 110/60  Pulse: 64  Resp: 15  Temp: (!) 97.1 F (36.2 C)  SpO2: 98%  Weight: 190 lb (86.2 kg)  Height: 6' (1.829 m)    General: Well developed, well nourished, in no acute distress  Skin : Warm and dry.  Head: Normocephalic and atraumatic  Eyes: Sclera and conjunctiva clear; pupils round and reactive to light; extraocular movements intact  Ears: External normal; canals clear; tympanic membranes normal  Oropharynx: Pink, supple. No suspicious lesions  Neck: Supple without thyromegaly, adenopathy  Lungs: Respirations unlabored; clear to auscultation bilaterally without wheeze, rales, rhonchi   CVS exam: normal rate and regular rhythm.  Abdomen: Soft; nontender; nondistended; normoactive bowel sounds; no masses or hepatosplenomegaly  Neurologic: Alert and oriented;  speech intact; face symmetrical; moves all extremities well; CNII-XII intact without focal deficit   Assessment:  1. Chest pain, unspecified type   2. Abdominal pain, unspecified abdominal location   3. Nausea   4. Weight loss     Plan:  Patient is well appearing in office but presenting symptoms are concerning; he notes during course of visit that he feels like his chest pain is worsening compared to how he has felt for the past 1-2 weeks; EKG shows possible block; With history of CAD and presenting symptoms, recommend ER and patient in agreement; MD at ER notified; follow up to be determined;  This visit occurred during the SARS-CoV-2 public health emergency.  Safety protocols were in place, including screening questions prior to the visit, additional usage of staff PPE, and extensive cleaning of exam room while observing appropriate contact time as indicated for disinfecting solutions.     No follow-ups on file.  Orders Placed This Encounter  Procedures  . EKG 12-Lead    Requested Prescriptions    No prescriptions requested or ordered in this encounter

## 2021-04-25 NOTE — ED Provider Notes (Signed)
[facility] EMERGENCY DEPARTMENT Provider Note   CSN: [id number] Arrival date & time: 04/25/21  1533     History Chief Complaint  Patient presents with  . Chest Pain  . Abdominal Pain    Ronald Petersen is a 65 y.o. male.  Patient sent down from the primary care office upstairs.  Patient had presented with several complaints.  To include left-sided chest pressure and bilateral lower quadrant abdominal pain.  Has been going on for 2 weeks.  Patient status post stent back in November states that he has had chest pain quite frequently since the stent was placed and was experiencing now on the chest pain is not any different than what he experiences before.  But the abdominal pain is new.  No vomiting does have nausea no diarrhea.  No fevers.  No shortness of breath.  Past medical history significant for diabetes hyperlipidemia hypertension.  Patient is never had abdominal pain like this before.        Past Medical History:  Diagnosis Date  . Anxiety   . Arthritis   . Cancer (HCC)   . Complication of anesthesia    aspirated with back surgery at age 65  . Depression   . Diabetes mellitus   . GERD (gastroesophageal reflux disease)   . Hyperlipidemia   . Hypertension   . Neuromuscular disorder (HCC)    NEUROPATHY  . Right rotator cuff tear 11/23/2018  . Sleep apnea    no CPAP    Patient Active Problem List   Diagnosis Date Noted  . Type 2 diabetes mellitus with hyperglycemia, without long-term current use of insulin (HCC) 01/22/2021  . Type 2 diabetes mellitus with diabetic polyneuropathy, without long-term current use of insulin (HCC) 01/22/2021  . Metabolic syndrome 01/17/2021  . Coronary artery disease 01/17/2021  . Anxiety   . Arthritis   . Cancer (HCC)   . Complication of anesthesia   . GERD (gastroesophageal reflux disease)   . Hyperlipidemia   . Hypertension   . Neuromuscular disorder (HCC)   . Sleep apnea   . Coronary artery disease involving  native coronary artery of native heart with angina pectoris (HCC) 11/20/2020  . CAD S/P DES PCI LAD & LCx-OM 11/20/2020  . Unstable angina (HCC) 11/19/2020  . Abnormal cardiac CT angiography 11/19/2020  . Shortness of breath 10/02/2020  . Dizziness 10/02/2020  . Chest pain 10/02/2020  . Obesity (BMI 30-39.9) 10/02/2020  . Type 2 diabetes mellitus with diabetic neuropathy, without long-term current use of insulin (HCC) 06/06/2020  . Uncontrolled type 2 diabetes mellitus with hyperglycemia (HCC) 05/31/2020  . Blurry vision 02/07/2020  . Spinal stenosis of lumbar region 02/07/2020  . Frequent falls 02/07/2020  . Foraminal stenosis of cervical region 07/26/2019  . Aortic atherosclerosis (HCC) 05/27/2019  . Chronic bilateral low back pain without sciatica 05/20/2019  . Carpal tunnel syndrome, bilateral 05/20/2019  . Leg weakness, bilateral 01/05/2019  . Falling episodes 01/05/2019  . Right rotator cuff tear 11/23/2018  . Neck pain 11/04/2018  . Acute pain of right shoulder 11/04/2018  . Gastroesophageal reflux disease 11/04/2018  . Hyperlipidemia associated with type 2 diabetes mellitus (HCC) 11/04/2018  . 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 Essential hypertension 09/14/2017  . Preventative health care 06/12/2016  . OSA (obstructive sleep apnea) 04/07/2016  . Joint pain 04/05/2016  . NSAID long-term use 12/13/2015  . Nausea without vomiting 12/13/2015  . History of colonic polyps  12/13/2015  . Loss of weight 12/13/2015  . Depression 11/06/2015  . Metatarsal deformity 02/20/2015  . Equinus deformity of foot, acquired 02/20/2015  . Plantar fasciitis, bilateral 02/15/2015  . Left wrist injury 06/15/2014  . Peripheral neuropathy 04/01/2012  . Fatigue 11/05/2010  . OTHER SPECIFIED DISORDER OF PENIS 03/26/2010  . NAUSEA 03/26/2010  . Pain in limb 10/17/2008  . NECK PAIN, CHRONIC 11/24/2007  . DM (diabetes mellitus) type II uncontrolled,  periph vascular disorder ([place]) 01/27/2007  . Hyperlipidemia LDL goal <70 01/27/2007    Past Surgical History:  Procedure Laterality Date  . APPENDECTOMY    . ARTHOSCOPIC ROTAOR CUFF REPAIR Right 11/23/2018   Procedure: ARTHROSCOPIC ROTATOR CUFF REPAIR;  Surgeon: [name], MD;  Location: [facility];  Service: Orthopedics;  Laterality: Right;  . CHOLECYSTECTOMY    . CORONARY STENT INTERVENTION N/A 11/19/2020   Procedure: CORONARY STENT INTERVENTION;  Surgeon: Nelva Bush, MD;  Location: [facility];  Service: Cardiovascular;  Laterality: N/A;  . INTRAVASCULAR ULTRASOUND/IVUS N/A 11/19/2020   Procedure: Intravascular Ultrasound/IVUS;  Surgeon: Nelva Bush, MD;  Location: [facility];  Service: Cardiovascular;  Laterality: N/A;  . LEFT HEART CATH AND CORONARY ANGIOGRAPHY N/A 11/19/2020   Procedure: LEFT HEART CATH AND CORONARY ANGIOGRAPHY;  Surgeon: Nelva Bush, MD;  Location: [facility];  Service: Cardiovascular;  Laterality: N/A;  . LEFT HEART CATH AND CORONARY ANGIOGRAPHY N/A 12/24/2020   Procedure: LEFT HEART CATH AND CORONARY ANGIOGRAPHY;  Surgeon: Martinique, Peter M, MD;  Location: [facility];  Service: Cardiovascular;  Laterality: N/A;  . LUMBAR LAMINECTOMY    . SHOULDER ARTHROSCOPY WITH ROTATOR CUFF REPAIR AND SUBACROMIAL DECOMPRESSION Right 11/23/2018   Procedure: SHOULDER ARTHROSCOPY WITH ROTATOR CUFF REPAIR AND SUBACROMIAL DECOMPRESSION;  Surgeon: [name], MD;  Location: [facility];  Service: Orthopedics;  Laterality: Right;       Family History  Problem Relation Age of Onset  . Ovarian cancer Sister   . Stomach cancer Sister   . Heart disease Sister        MI  . Heart disease Sister        MI  . Alcohol abuse Other   . Depression Other   . Arthritis Other   . Hypertension Other   . Stomach cancer Maternal Grandmother   . COPD Mother   . Stroke Father   . Coronary artery disease Other    . Ovarian cancer Other        neice  . Ovarian cancer Other        neice  . Colon cancer Other     Social History   Tobacco Use  . Smoking status: Former Smoker    Years: 16.00    Types: Cigarettes    Quit date: 12/29/1985    Years since quitting: 35.3  . Smokeless tobacco: Never Used  Vaping Use  . Vaping Use: Never used  Substance Use Topics  . Alcohol use: Yes    Alcohol/week: 4.0 - 5.0 standard drinks    Types: 4 - 5 Cans of beer per week    Comment: 5 times per week  . Drug use: No    Home Medications Prior to Admission medications   Medication Sig Start Date End Date Taking? Authorizing Provider  aspirin EC 81 MG tablet Take 1 tablet (81 mg total) by mouth daily. Swallow whole. 11/14/20  Yes Tobb, Kardie, DO  fenofibrate 160 MG tablet TAKE 1 TABLET (160 MG  TOTAL) BY MOUTH DAILY. Patient taking differently: Take 160 mg by mouth daily at 12 noon. 09/06/20 09/06/21 Yes Lowne Lyndal Pulley R, DO  furosemide (LASIX) 40 MG tablet TAKE 1 TABLET (40 MG TOTAL) BY MOUTH EVERY OTHER DAY. 03/05/21 03/05/22 Yes Tobb, Kardie, DO  gabapentin (NEURONTIN) 800 MG tablet Take 1 tablet (800 mg total) by mouth 3 (three) times daily. 09/25/20  Yes Hyatt, Max T, DPM  glimepiride ([name]) 2 MG tablet TAKE 1 TABLET (2 MG TOTAL) BY MOUTH DAILY BEFORE BREAKFAST. 01/22/21 01/22/22 Yes Shamleffer, Melanie Crazier, MD  lisinopril (ZESTRIL) 5 MG tablet TAKE 1 TABLET (5 MG TOTAL) BY MOUTH DAILY. 12/10/20 12/10/21 Yes [name], DO  metFORMIN ([name]) 500 MG 24 hr tablet Take 2 tablets (1,000 mg total) by mouth daily with breakfast AND 1 tablet (500 mg total) daily with supper. 11/21/20  Yes End, Harrell Gave, MD  metFORMIN (GLUCOPHAGE-XR) 500 MG 24 hr tablet TAKE 2 TABLETS (1,000 MG TOTAL) BY MOUTH DAILY WITH BREAKFAST AND 1 TABLET (500 MG TOTAL) DAILY WITH SUPPER. 09/11/20 09/11/21 Yes Shamleffer, Melanie Crazier, MD  nitroGLYCERIN (NITROSTAT) 0.4 MG SL tablet PLACE 1 TABLET (0.4 MG TOTAL) UNDER  THE TONGUE EVERY 5 (FIVE) MINUTES AS NEEDED FOR CHEST PAIN. 10/02/20 10/02/21 Yes Tobb, Kardie, DO  omeprazole (PRILOSEC) 40 MG capsule Take 40 mg by mouth daily.   Yes [provider]  ondansetron (ZOFRAN) 4 MG tablet TAKE 1 TABLET BY MOUTH EVERY 8 HOURS AS NEEDED 03/14/21 03/14/22 Yes Roma Schanz R, DO  oxyCODONE-acetaminophen (PERCOCET/ROXICET) 5-325 MG tablet TAKE 1 TABLET BY MOUTH EVERY 4 (FOUR) HOURS AS NEEDED FOR SEVERE PAIN. 04/23/21 10/20/21 Yes Shelda Pal, DO  pantoprazole (PROTONIX) 40 MG tablet TAKE 1 TABLET (40 MG TOTAL) BY MOUTH DAILY. 03/07/21 03/07/22 Yes Roma Schanz R, DO  potassium chloride SA (KLOR-CON) 20 MEQ tablet TAKE 1 TABLET (20 MEQ TOTAL) BY MOUTH EVERY OTHER DAY. 03/05/21 03/05/22 Yes Tobb, Kardie, DO  ranolazine (RANEXA) 1000 MG SR tablet TAKE 1 TABLET (1,000 MG TOTAL) BY MOUTH 2 (TWO) TIMES DAILY. 01/17/21 01/17/22 Yes Tobb, Kardie, DO  rosuvastatin (CRESTOR) 40 MG tablet TAKE 1 TABLET BY MOUTH ONCE DAILY 12/11/20 12/11/21 Yes Tobb, Kardie, DO  sennosides-docusate sodium (SENOKOT-S) 8.6-50 MG tablet Take 2 tablets by mouth daily. Patient taking differently: Take 1 tablet by mouth daily as needed for constipation. 11/23/18  Yes [name], MD  ticagrelor (BRILINTA) 90 MG TABS tablet TAKE 1 TABLET (90 MG TOTAL) BY MOUTH TWO TIMES DAILY. 11/20/20 11/20/21 Yes Reino Bellis B, NP  tiZANidine (ZANAFLEX) 4 MG tablet TAKE 1 TABLET (4 MG TOTAL) BY MOUTH EVERY 6 (SIX) HOURS AS NEEDED FOR MUSCLE SPASMS. 05/11/19  Yes Roma Schanz R, DO  traZODone (DESYREL) 50 MG tablet TAKE 1/2-1 TABLET (25-50 MG TOTAL) BY MOUTH AT BEDTIME AS NEEDED FOR SLEEP. 12/10/20 12/10/21 Yes Lowne Lyndal Pulley R, DO  glucose blood test strip USE AS INSTRUCTED TO CHECK BLOOD SUGAR ONCE A DAY Patient taking differently: USE AS INSTRUCTED TO CHECK BLOOD SUGAR ONCE A DAY 01/22/21 01/22/22  Shamleffer, Melanie Crazier, MD  sertraline (ZOLOFT) 100 MG tablet TAKE ONE AND ONE-HALF  TABLET BY MOUTH ONCE DAILY 03/07/21 03/07/22  Lowne Chase, Yvonne R, DO  Suvorexant 10 MG TABS TAKE 1 TABLET BY MOUTH AT BEDTIME AS NEEDED. MAY INCREASE TO 2 TABLETS THE NEXT NIGHT IF NEEDED 01/08/21 07/07/21  [name], Alferd Apa, DO  isosorbide mononitrate (IMDUR) 30 MG 24 hr tablet Take 1  tablet (30 mg total) by mouth daily. 01/17/21 03/05/21  Tobb, Kardie, DO  metoprolol tartrate (LOPRESSOR) 100 MG tablet Take 1 tablet (100 mg total) by mouth once for 1 dose. Take two hours prior to your cardiac CT Patient taking differently: Take 100 mg by mouth daily.  10/02/20 11/20/20  Tobb, Godfrey Pick, DO    Allergies    Niacin  Review of Systems   Review of Systems  Constitutional: Negative for chills and fever.  HENT: Negative for ear pain and sore throat.   Eyes: Negative for pain and visual disturbance.  Respiratory: Negative for cough and shortness of breath.   Cardiovascular: Positive for chest pain. Negative for palpitations.  Gastrointestinal: Positive for abdominal pain and nausea. Negative for vomiting.  Genitourinary: Negative for dysuria and hematuria.  Musculoskeletal: Negative for arthralgias and back pain.  Skin: Negative for color change and rash.  Neurological: Negative for seizures and syncope.  All other systems reviewed and are negative.   Physical Exam Updated Vital Signs BP 137/74   Pulse 70   Temp (!) 97.4 F (36.3 C) (Oral)   Resp 12   Ht 1.829 m (6')   Wt 86.2 kg   SpO2 99%   BMI 25.77 kg/m   Physical Exam Vitals and nursing note reviewed.  Constitutional:      General: He is not in acute distress.    Appearance: Normal appearance. He is well-developed. He is not ill-appearing.  HENT:     Head: Normocephalic and atraumatic.  Eyes:     Extraocular Movements: Extraocular movements intact.     Conjunctiva/sclera: Conjunctivae normal.     Pupils: Pupils are equal, round, and reactive to light.  Cardiovascular:     Rate and Rhythm: Normal rate and regular rhythm.      Heart sounds: No murmur heard.   Pulmonary:     Effort: Pulmonary effort is normal. No respiratory distress.     Breath sounds: Normal breath sounds.  Chest:     Chest wall: No tenderness.  Abdominal:     Palpations: Abdomen is soft.     Tenderness: There is abdominal tenderness.     Comments: Mild tenderness lower quadrants.  No guarding.  Musculoskeletal:        General: No swelling. Normal range of motion.     Cervical back: Normal range of motion and neck supple.  Skin:    General: Skin is warm and dry.     Capillary Refill: Capillary refill takes less than 2 seconds.  Neurological:     General: No focal deficit present.     Mental Status: He is alert and oriented to person, place, and time.     Cranial Nerves: No cranial nerve deficit.     Sensory: No sensory deficit.     Motor: No weakness.     ED Results / Procedures / Treatments   Labs (all labs ordered are listed, but only abnormal results are displayed) Labs Reviewed  COMPREHENSIVE METABOLIC PANEL - Abnormal; Notable for the following components:      Result Value   Sodium 134 (*)    All other components within normal limits  URINALYSIS, ROUTINE W REFLEX MICROSCOPIC - Abnormal; Notable for the following components:   Specific Gravity, Urine <1.005 (*)    pH 8.5 (*)    Hgb urine dipstick TRACE (*)    All other components within normal limits  URINALYSIS, MICROSCOPIC (REFLEX) - Abnormal; Notable for the following components:   Bacteria, UA RARE (*)  All other components within normal limits  URINE CULTURE  CBC WITH DIFFERENTIAL/PLATELET  LIPASE, BLOOD  TROPONIN I (HIGH SENSITIVITY)  TROPONIN I (HIGH SENSITIVITY)    EKG EKG Interpretation  Date/Time:  Thursday April 25 2021 15:49:50 EDT Ventricular Rate:  65 PR Interval:  170 QRS Duration: 106 QT Interval:  406 QTC Calculation: 422 R Axis:   -36 Text Interpretation: Normal sinus rhythm Left axis deviation Abnormal ECG Confirmed by Fredia Sorrow [id number]) on 04/25/2021 4:25:25 PM   Radiology DG Chest 2 View  Result Date: 04/25/2021 CLINICAL DATA:  65 year old male with chest pain and shortness of breath. Weight loss EXAM: CHEST - 2 VIEW COMPARISON:  Chest radiograph dated 12/23/2020. FINDINGS: No focal consolidation, pleural effusion, or pneumothorax. The cardiac silhouette is within limits. Coronary vascular calcification and atherosclerotic calcification of the aortic arch. No acute osseous pathology. Old right lateral rib fractures. IMPRESSION: No active cardiopulmonary disease. Electronically Signed   By: Anner Crete M.D.   On: 04/25/2021 17:19   CT Abdomen Pelvis W Contrast  Result Date: 04/25/2021 CLINICAL DATA:  Left chest pressure and lower abdominal pain for a few weeks. Nausea and shortness of breath. EXAM: CT ABDOMEN AND PELVIS WITH CONTRAST TECHNIQUE: Multidetector CT imaging of the abdomen and pelvis was performed using the standard protocol following bolus administration of intravenous contrast. CONTRAST:  161mL OMNIPAQUE IOHEXOL 300 MG/ML  SOLN COMPARISON:  05/27/2019 FINDINGS: Lower chest: Lung bases are clear. Aortic and coronary artery calcifications. Hepatobiliary: No focal liver abnormality is seen. Status post cholecystectomy. No biliary dilatation. Pancreas: Unremarkable. No pancreatic ductal dilatation or surrounding inflammatory changes. Spleen: Normal in size without focal abnormality. Adrenals/Urinary Tract: Adrenal glands are unremarkable. Kidneys are normal, without renal calculi, focal lesion, or hydronephrosis. Mild diffuse bladder wall thickening may indicate cystitis. No filling defect or bladder stone. Stomach/Bowel: Stomach, small bowel, and colon are not abnormally distended. No wall thickening or inflammatory changes. Stool fills the colon. Appendix is not identified. Vascular/Lymphatic: Aortic atherosclerosis. No enlarged abdominal or pelvic lymph nodes. Reproductive: Prostate gland is mildly enlarged,  measuring 4.8 cm diameter. Other: No free air or free fluid in the abdomen. Minimal periumbilical hernia containing fat. Musculoskeletal: Degenerative changes in the spine. No destructive bone lesions. IMPRESSION: 1. Mild diffuse bladder wall thickening may indicate cystitis. 2. No evidence of bowel obstruction or inflammation. 3. Mildly enlarged prostate gland. 4. Minimal periumbilical hernia containing fat. Aortic Atherosclerosis (ICD10-I70.0). Electronically Signed   By: Lucienne Capers M.D.   On: 04/25/2021 19:16    Procedures Procedures   Medications Ordered in ED Medications  0.9 %  sodium chloride infusion ( Intravenous New Bag/Given 04/25/21 1644)  HYDROmorphone (DILAUDID) injection 0.5 mg (has no administration in time range)  ondansetron (ZOFRAN) injection 4 mg (4 mg Intravenous Given 04/25/21 1643)  iohexol (OMNIPAQUE) 300 MG/ML solution 100 mL (100 mLs Intravenous Contrast Given 04/25/21 1856)    ED Course  I have reviewed the triage vital signs and the nursing notes.  Pertinent labs & imaging results that were available during my care of the patient were reviewed by me and considered in my medical decision making (see chart for details).    MDM Rules/Calculators/A&P                         Extensive work-up.  Troponins x2 are normal.  Urinalysis negative for urinary tract infection.  CBC without a leukocytosis.  Complete metabolic panel sodium slightly down at 134.  Function  was normal.  Lipase was normal.  The abdomen raise some concerns for some inflammation in the bladder.  But urinalysis negative for urinary tract infection.  So we will treat patient symptomatically.  Have him follow back up with primary care doctor.  Patient is also been seen by GI in the past but is not sure who.  Would recommend that he contact them for follow-up may need a colonoscopy.  Patient stable for discharge home.  Final Clinical Impression(s) / ED Diagnoses Final diagnoses:  Lower abdominal pain   Precordial pain    Rx / DC Orders ED Discharge Orders    None       Fredia Sorrow, MD 04/26/21 [phone]

## 2021-04-25 NOTE — ED Notes (Signed)
Patient transported to CT 

## 2021-04-25 NOTE — Discharge Instructions (Addendum)
Work-up here today without any acute findings.  Which is reassuring but does not explain why you have the pain.  Would recommend following back up with your primary care doctor as well as following up with your gastroenterologist.  Also make an appointment to follow-up with cardiology.  The work-up for the chest pain without any significant findings.  Take the Percocet that she have at home.  That was prescribed on April 22 for the pain.

## 2021-04-25 NOTE — ED Notes (Signed)
Pt c/o chest pain & abdominal pain associated with nausea denies vomiting

## 2021-04-25 NOTE — ED Triage Notes (Signed)
Pt reports left sided chest pressure and lower abd pain x few weeks. Went to primary doctor and was sent here. Reports had stents placed in heart last November. + nausea and SOB

## 2021-04-27 LAB — URINE CULTURE: Culture: NO GROWTH

## 2021-05-01 ENCOUNTER — Telehealth: Payer: Self-pay

## 2021-05-01 ENCOUNTER — Encounter: Payer: Self-pay | Admitting: Nurse Practitioner

## 2021-05-01 ENCOUNTER — Ambulatory Visit: Payer: Medicare HMO | Admitting: Nurse Practitioner

## 2021-05-01 VITALS — BP 118/56 | HR 48 | Ht 72.0 in | Wt 192.0 lb

## 2021-05-01 DIAGNOSIS — K625 Hemorrhage of anus and rectum: Secondary | ICD-10-CM | POA: Diagnosis not present

## 2021-05-01 DIAGNOSIS — R110 Nausea: Secondary | ICD-10-CM | POA: Diagnosis not present

## 2021-05-01 DIAGNOSIS — R1030 Lower abdominal pain, unspecified: Secondary | ICD-10-CM | POA: Diagnosis not present

## 2021-05-01 DIAGNOSIS — R634 Abnormal weight loss: Secondary | ICD-10-CM | POA: Diagnosis not present

## 2021-05-01 NOTE — Progress Notes (Signed)
ASSESSMENT AND PLAN    # 65 yo male with multiple complaints including a one month history of generalized lower abdominal pain, recent constipation with rectal bleeding, a 20 pound unintentional weight loss over 6 months.  Additionally he has  persistent chest pain despite DES placement in November 2021, trial of Ranexa and Imdur for angina.  --Weight loss could be due in part to starting Lasix in March though his weight was declining before then. -- Multiple GI complaints difficult to sort out.  He does need an EGD for evaluation of nausea, weight loss and to exclude a GI source of ongoing chest pain. The risks and benefits of EGD with possible biopsies was discussed with the patient and they agree to proceed.  -- Colonoscopy probably low yield. However in the setting of recent rectal bleeding and weight loss will proceed with a colonoscopy to be done at time of EGD. The risks and benefits of colonoscopy with possible polypectomy / biopsies were discussed and the patient agrees to proceed.  -- Continue MiraLAX to avoid recurrent constipation -- Possible cystitis on recent CT scan.  Seems unlikely to be the cause of his lower abdominal pain especially with a pain migrates around lower abdomen.    # Chest pain. Doesn't feel like GERD and he is on high dose PPI.  # CAD, s/p PCI with DES November 2021. On Brillinta.  -- Hold Brillinta for 5 days before procedure - will instruct when and how to resume after procedure. Patient understands that there is a low but real risk of cardiovascular event such as heart attack, stroke, or embolism /  thrombosis, or ischemia while off Brillinta. The patient consents to proceed. Will communicate by phone or EMR with patient's prescribing provider to confirm that holding Brillinta is reasonable in this case.   HISTORY OF PRESENT ILLNESS     Chief Complaint : multiple complaints including chest pain , nausea, abdominal pain, weight loss, recent constipation,  rectal bleeding  Ronald Petersen is a 65 y.o. male with a past medical history significant for CAD s/p DES placement Nov 2021, HTN, hyperlipidemia, DM, OSA,.  Cholecystectomy, appendectomy.  See additional PMH below.   Patient known to Dr. Havery Moros, not seen since 2017. He is referred by PCP for evaluation of nausea and abdominal pain.    Ronald Petersen has a history of CAD and is s/p PCI with DES in November 2021.  Subsequently started on ASA and Brillinta. Patient says he has continued to have chest pain. Cardiology has put him on Ranexa and and Imdur to help with angina but he still has chest pain.  He could not tolerate Imdur secondary to fatigue.  Admitted to the hospital in December 20221 for progressive chest pain, underwent left heart cath showing nonobstructive CAD. .   04/25/21 - ED visit for chest pain. He also complained of generalized lower abdominal pain and nausea. Troponins were normal.  CBC, CMP and lipase were normal. CXR normal. EKG showed NSR, left Axis deviation. CT scan w/ contrast remarkable for possible cystitis , mildly enlarged prostate .  Urinalysis showed a high pH, otherwise unremarkable.  Urine sent for culture and it was negative   INTERVAL HISTORY:  Ronald Petersen gives a one month history of progressive generalized lower abdominal pain. The pain migrates around lower abdomen but predominantly LLQ with radiation around to left lower back.  The pain is postprandial, but sometimes may occur with bending or twisting.  Patient does not feel pain  is related to constipation.  He was constipated a few weeks ago but added daily MiraLAX to the stool softeners that he was taking and constipation resolved.  His bowels are moving fine now.  He does offer that while constipated he had some rectal bleeding.   In addition to above, Ronald Petersen reports frequent postprandial nausea without vomiting .  He cannot relate the nausea to any medication changes.  He has lost 20 pounds over the last several month despite  a good appetite.   Additionally Ronald Petersen continues to have left sided, nonexertional chest pain.  GERD states the chest pain is different than heartburn.  He takes pantoprazole in the morning and omeprazole in the evening.  No dysphagia.    PREVIOUS EVALUATIONS:   2017 Colonoscopy for polyp surveillance --complete exam, adequate prep --Tortous colon. No polyps otherwise, normal colon, nternal hemorrhoids, hypertrophied anal papillae   04/25/21 CT scan w/ contrast IMPRESSION: 1. Mild diffuse bladder wall thickening may indicate cystitis. 2. No evidence of bowel obstruction or inflammation. 3. Mildly enlarged prostate gland. 4. Minimal periumbilical hernia containing fat.   Past Medical History:  Diagnosis Date  . Anxiety   . Arthritis   . Cancer ([place])   . Complication of anesthesia    aspirated with back surgery at age 89  . Depression   . Diabetes mellitus   . GERD (gastroesophageal reflux disease)   . Hyperlipidemia   . Hypertension   . Neuromuscular disorder ([place])    NEUROPATHY  . Right rotator cuff tear 11/23/2018  . Sleep apnea    no CPAP     Past Surgical History:  Procedure Laterality Date  . APPENDECTOMY    . ARTHOSCOPIC ROTAOR CUFF REPAIR Right 11/23/2018   Procedure: ARTHROSCOPIC ROTATOR CUFF REPAIR;  Surgeon: [name], MD;  Location: [facility];  Service: Orthopedics;  Laterality: Right;  . CHOLECYSTECTOMY    . CORONARY STENT INTERVENTION N/A 11/19/2020   Procedure: CORONARY STENT INTERVENTION;  Surgeon: Nelva Bush, MD;  Location: [facility];  Service: Cardiovascular;  Laterality: N/A;  . INTRAVASCULAR ULTRASOUND/IVUS N/A 11/19/2020   Procedure: Intravascular Ultrasound/IVUS;  Surgeon: Nelva Bush, MD;  Location: [facility];  Service: Cardiovascular;  Laterality: N/A;  . LEFT HEART CATH AND CORONARY ANGIOGRAPHY N/A 11/19/2020   Procedure: LEFT HEART CATH AND CORONARY ANGIOGRAPHY;  Surgeon: Nelva Bush, MD;   Location: [facility];  Service: Cardiovascular;  Laterality: N/A;  . LEFT HEART CATH AND CORONARY ANGIOGRAPHY N/A 12/24/2020   Procedure: LEFT HEART CATH AND CORONARY ANGIOGRAPHY;  Surgeon: Martinique, Peter M, MD;  Location: [facility];  Service: Cardiovascular;  Laterality: N/A;  . LUMBAR LAMINECTOMY    . SHOULDER ARTHROSCOPY WITH ROTATOR CUFF REPAIR AND SUBACROMIAL DECOMPRESSION Right 11/23/2018   Procedure: SHOULDER ARTHROSCOPY WITH ROTATOR CUFF REPAIR AND SUBACROMIAL DECOMPRESSION;  Surgeon: [name], MD;  Location: [facility];  Service: Orthopedics;  Laterality: Right;   Family History  Problem Relation Age of Onset  . Ovarian cancer Sister   . Stomach cancer Sister   . Heart disease Sister        MI  . Heart disease Sister        MI  . Alcohol abuse Other   . Depression Other   . Arthritis Other   . Hypertension Other   . Stomach cancer Maternal Grandmother   . COPD Mother   . Stroke Father   . Coronary artery disease Other   .  Ovarian cancer Other        neice  . Ovarian cancer Other        neice  . Colon cancer Other    Social History   Tobacco Use  . Smoking status: Former Smoker    Years: 16.00    Types: Cigarettes    Quit date: 12/29/1985    Years since quitting: 35.3  . Smokeless tobacco: Never Used  Vaping Use  . Vaping Use: Never used  Substance Use Topics  . Alcohol use: Yes    Alcohol/week: 4.0 - 5.0 standard drinks    Types: 4 - 5 Cans of beer per week    Comment: 5 times per week  . Drug use: No   Current Outpatient Medications  Medication Sig Dispense Refill  . aspirin EC 81 MG tablet Take 1 tablet (81 mg total) by mouth daily. Swallow whole. 90 tablet 3  . fenofibrate 160 MG tablet TAKE 1 TABLET (160 MG TOTAL) BY MOUTH DAILY. (Patient taking differently: Take 160 mg by mouth daily at 12 noon.) 90 tablet 1  . furosemide (LASIX) 40 MG tablet TAKE 1 TABLET (40 MG TOTAL) BY MOUTH EVERY OTHER DAY. 90 tablet 3  .  gabapentin (NEURONTIN) 800 MG tablet Take 1 tablet (800 mg total) by mouth 3 (three) times daily. 270 tablet 3  . glimepiride ([name]) 2 MG tablet TAKE 1 TABLET (2 MG TOTAL) BY MOUTH DAILY BEFORE BREAKFAST. 90 tablet 3  . glucose blood test strip USE AS INSTRUCTED TO CHECK BLOOD SUGAR ONCE A DAY (Patient taking differently: USE AS INSTRUCTED TO CHECK BLOOD SUGAR ONCE A DAY) 100 strip 12  . lisinopril (ZESTRIL) 5 MG tablet TAKE 1 TABLET (5 MG TOTAL) BY MOUTH DAILY. 90 tablet 1  . metFORMIN ([name]) 500 MG 24 hr tablet Take 2 tablets (1,000 mg total) by mouth daily with breakfast AND 1 tablet (500 mg total) daily with supper. 270 tablet 3  . metFORMIN (GLUCOPHAGE-XR) 500 MG 24 hr tablet TAKE 2 TABLETS (1,000 MG TOTAL) BY MOUTH DAILY WITH BREAKFAST AND 1 TABLET (500 MG TOTAL) DAILY WITH SUPPER. 270 tablet 3  . nitroGLYCERIN (NITROSTAT) 0.4 MG SL tablet PLACE 1 TABLET (0.4 MG TOTAL) UNDER THE TONGUE EVERY 5 (FIVE) MINUTES AS NEEDED FOR CHEST PAIN. 90 tablet 3  . omeprazole (PRILOSEC) 40 MG capsule Take 40 mg by mouth daily.    . ondansetron (ZOFRAN) 4 MG tablet TAKE 1 TABLET BY MOUTH EVERY 8 HOURS AS NEEDED 20 tablet 2  . oxyCODONE-acetaminophen (PERCOCET/ROXICET) 5-325 MG tablet TAKE 1 TABLET BY MOUTH EVERY 4 (FOUR) HOURS AS NEEDED FOR SEVERE PAIN. 120 tablet 0  . pantoprazole (PROTONIX) 40 MG tablet TAKE 1 TABLET (40 MG TOTAL) BY MOUTH DAILY. 30 tablet 3  . potassium chloride SA (KLOR-CON) 20 MEQ tablet TAKE 1 TABLET (20 MEQ TOTAL) BY MOUTH EVERY OTHER DAY. 90 tablet 3  . ranolazine (RANEXA) 1000 MG SR tablet TAKE 1 TABLET (1,000 MG TOTAL) BY MOUTH 2 (TWO) TIMES DAILY. 60 tablet 3  . rosuvastatin (CRESTOR) 40 MG tablet TAKE 1 TABLET BY MOUTH ONCE DAILY 90 tablet 3  . sennosides-docusate sodium (SENOKOT-S) 8.6-50 MG tablet Take 2 tablets by mouth daily. (Patient taking differently: Take 1 tablet by mouth daily as needed for constipation.) 30 tablet 1  . sertraline (ZOLOFT) 100 MG tablet TAKE ONE  AND ONE-HALF TABLET BY MOUTH ONCE DAILY 135 tablet 3  . Suvorexant 10 MG TABS TAKE 1 TABLET BY MOUTH AT BEDTIME  AS NEEDED. MAY INCREASE TO 2 TABLETS THE NEXT NIGHT IF NEEDED 30 tablet 0  . ticagrelor (BRILINTA) 90 MG TABS tablet TAKE 1 TABLET (90 MG TOTAL) BY MOUTH TWO TIMES DAILY. 180 tablet 1  . tiZANidine (ZANAFLEX) 4 MG tablet TAKE 1 TABLET (4 MG TOTAL) BY MOUTH EVERY 6 (SIX) HOURS AS NEEDED FOR MUSCLE SPASMS. 30 tablet 1  . traZODone (DESYREL) 50 MG tablet TAKE 1/2-1 TABLET (25-50 MG TOTAL) BY MOUTH AT BEDTIME AS NEEDED FOR SLEEP. 30 tablet 3   No current facility-administered medications for this visit.   Allergies  Allergen Reactions  . Niacin Anaphylaxis     Review of Systems: Positive for anxiety, arthritis, back pain, vision changes, confusion, depression, fatigue, headaches, muscle pain and cramps, night sweats, shortness of breath, swelling of feet and legs, excessive urination, urinary frequency.  All other systems reviewed and negative except where noted in HPI.    PHYSICAL EXAM :    Wt Readings from Last 3 Encounters:  04/25/21 190 lb (86.2 kg)  04/25/21 190 lb (86.2 kg)  03/12/21 199 lb (90.3 kg)    BP (!) 118/56   Pulse (!) 48   Ht 6' (1.829 m)   Wt 192 lb (87.1 kg)   SpO2 99%   BMI 26.04 kg/m  Constitutional:  Pleasant male in no acute distress. Psychiatric: Normal mood and affect. Behavior is normal. EENT: Pupils normal.  Conjunctivae are normal. No scleral icterus. Neck supple.  Cardiovascular: Normal rate, regular rhythm. No edema Pulmonary/chest: Effort normal and breath sounds normal. No wheezing, rales or rhonchi. Abdominal: Soft, nondistended, nontender. Bowel sounds active throughout. There are no masses palpable. No hepatomegaly. Neurological: Alert and oriented to person place and time. Skin: Skin is warm and dry. No rashes noted.  Tye Savoy, NP  05/01/2021, 1:52 PM  Cc:  Referring Provider [name], Alferd Apa, *

## 2021-05-01 NOTE — Telephone Encounter (Signed)
[facility] Pre-operative Risk Assessment     Request for surgical clearance:     Endoscopy Procedure  What type of surgery is being performed?     Colonoscopy  When is this surgery scheduled?     05-15-2021  What type of clearance is required ?   Pharmacy  Are there any medications that need to be held prior to surgery and how long? Yes, [place] name and name of physician performing surgery?      [facility]  What is your office phone and fax number?      Phone- [phone]  [phone]  Anesthesia type (None, local, MAC, general) ?       MAC

## 2021-05-01 NOTE — Patient Instructions (Signed)
If you are age 66 or older, your body mass index should be between 23-30. Your Body mass index is 26.04 kg/m. If this is out of the aforementioned range listed, please consider follow up with your Primary Care Provider.  If you are age 37 or younger, your body mass index should be between 19-25. Your Body mass index is 26.04 kg/m. If this is out of the aformentioned range listed, please consider follow up with your Primary Care Provider.   You have been scheduled for an endoscopy and colonoscopy. Please follow the written instructions given to you at your visit today. Please pick up your prep supplies at the pharmacy within the next 1-3 days. If you use inhalers (even only as needed), please bring them with you on the day of your procedure.   It was a pleasure to see you today!  Thank you for trusting me with your gastrointestinal care!

## 2021-05-01 NOTE — Telephone Encounter (Signed)
Dr. Harriet Masson Pt had PCI in Nov 2021 on ASA and brilinta. He has continued to have chest pain, relook cath 11/2020 with patent stents. He recently went to the ER for chest discomfort, ruled out with negative trops and was referred to GI. We are now asked for clearance and brilinta hold. Given his ongoing chest pain but patent stents, do you recommend additional workup prior to colonoscopy? When is the soonest he may interrupt brilinta?

## 2021-05-02 ENCOUNTER — Other Ambulatory Visit (HOSPITAL_BASED_OUTPATIENT_CLINIC_OR_DEPARTMENT_OTHER): Payer: Self-pay

## 2021-05-02 ENCOUNTER — Other Ambulatory Visit: Payer: Self-pay | Admitting: Family Medicine

## 2021-05-02 ENCOUNTER — Other Ambulatory Visit: Payer: Self-pay | Admitting: Cardiology

## 2021-05-02 DIAGNOSIS — E785 Hyperlipidemia, unspecified: Secondary | ICD-10-CM

## 2021-05-02 DIAGNOSIS — G4700 Insomnia, unspecified: Secondary | ICD-10-CM

## 2021-05-02 DIAGNOSIS — E1169 Type 2 diabetes mellitus with other specified complication: Secondary | ICD-10-CM

## 2021-05-02 MED ORDER — OMEPRAZOLE 40 MG PO CPDR
DELAYED_RELEASE_CAPSULE | ORAL | 3 refills | Status: DC
  Filled 2021-05-02: qty 180, 90d supply, fill #0

## 2021-05-02 MED FILL — Glucose Blood Test Strip: 90 days supply | Qty: 100 | Fill #0 | Status: AC

## 2021-05-02 MED FILL — Pantoprazole Sodium EC Tab 40 MG (Base Equiv): ORAL | 30 days supply | Qty: 30 | Fill #1 | Status: CN

## 2021-05-02 MED FILL — Metformin HCl Tab ER 24HR 500 MG: ORAL | 90 days supply | Qty: 270 | Fill #0 | Status: AC

## 2021-05-02 MED FILL — Ticagrelor Tab 90 MG: ORAL | 90 days supply | Qty: 180 | Fill #0 | Status: CN

## 2021-05-02 MED FILL — Nitroglycerin SL Tab 0.4 MG: SUBLINGUAL | 5 days supply | Qty: 25 | Fill #0 | Status: AC

## 2021-05-02 NOTE — Progress Notes (Signed)
Agree with assessment and plan as outlined.  

## 2021-05-03 ENCOUNTER — Ambulatory Visit: Payer: Medicare HMO

## 2021-05-03 ENCOUNTER — Encounter: Payer: Self-pay | Admitting: Counselor

## 2021-05-03 ENCOUNTER — Other Ambulatory Visit: Payer: Self-pay

## 2021-05-03 ENCOUNTER — Other Ambulatory Visit (HOSPITAL_BASED_OUTPATIENT_CLINIC_OR_DEPARTMENT_OTHER): Payer: Self-pay

## 2021-05-03 ENCOUNTER — Ambulatory Visit (INDEPENDENT_AMBULATORY_CARE_PROVIDER_SITE_OTHER): Payer: Medicare HMO | Admitting: Counselor

## 2021-05-03 DIAGNOSIS — F329 Major depressive disorder, single episode, unspecified: Secondary | ICD-10-CM

## 2021-05-03 DIAGNOSIS — F09 Unspecified mental disorder due to known physiological condition: Secondary | ICD-10-CM

## 2021-05-03 DIAGNOSIS — R413 Other amnesia: Secondary | ICD-10-CM

## 2021-05-03 DIAGNOSIS — G3184 Mild cognitive impairment, so stated: Secondary | ICD-10-CM | POA: Diagnosis not present

## 2021-05-03 DIAGNOSIS — F32A Depression, unspecified: Secondary | ICD-10-CM

## 2021-05-03 MED ORDER — RANOLAZINE ER 1000 MG PO TB12
ORAL_TABLET | Freq: Two times a day (BID) | ORAL | 3 refills | Status: DC
  Filled 2021-05-03 – 2021-05-21 (×2): qty 60, 30d supply, fill #0
  Filled 2021-06-19: qty 60, 30d supply, fill #1
  Filled 2021-07-10 – 2021-07-12 (×2): qty 60, 30d supply, fill #2
  Filled 2021-08-12: qty 60, 30d supply, fill #3

## 2021-05-03 MED ORDER — FENOFIBRATE 160 MG PO TABS
ORAL_TABLET | Freq: Every day | ORAL | 1 refills | Status: DC
  Filled 2021-05-03: qty 90, 90d supply, fill #0
  Filled 2021-10-21: qty 90, 90d supply, fill #1

## 2021-05-03 MED ORDER — TRAZODONE HCL 50 MG PO TABS
ORAL_TABLET | ORAL | 3 refills | Status: DC
  Filled 2021-05-03: qty 30, fill #0
  Filled 2021-05-21: qty 30, 30d supply, fill #0
  Filled 2021-06-19: qty 30, 30d supply, fill #1
  Filled 2021-07-10 – 2021-07-12 (×2): qty 30, 30d supply, fill #2
  Filled 2021-08-12: qty 30, 30d supply, fill #3

## 2021-05-03 NOTE — Progress Notes (Signed)
[facility]  Patient Name: Ronald Petersen MRN: [id number] Date of Birth: May 29, 1956 Age: 65 y.o. Education: 9 years  Measurement properties of test scores: IQ, Index, and Standard Scores (SS): Mean = 100; Standard Deviation = 15 Scaled Scores (Ss): Mean = 10; Standard Deviation = 3 Z scores (Z): Mean = 0; Standard Deviation = 1 T scores (T); Mean = 50; Standard Deviation = 10  TEST SCORES:    Note: This summary of test scores accompanies the interpretive report and should not be interpreted by unqualified individuals or in isolation without reference to the report. Test scores are relative to age, gender, and educational history as available and appropriate.   Performance Validity        ACS: Raw Descriptor      Word Choice: 60 Within Expectation      Rey 15 and Recognition: Raw Descriptor      Free Recall 11 Within Expectation      Recognition 12 ---      False Positives 1 ---      Combined Score 22 Within Expectation      MSVT: Raw Descriptor      IR 100 Within Expectation      DR 100 Within Expectation      CNS 100 Within Expectation      PA 100 ---      FR 60 ---      The Dot Counting Test: Raw Descriptor      E-Score 16 Below Expectation  "A" Random Letter Test Errors 0 999      Embedded Measures: Raw Descriptor      RBANS Effort Index: 4 Below Expectation      WAIS-IV Reliable Digit Span 7 Within Expectation      WAIS-IV Reliable Digit Span Revised 10 Below Expectation      Mental Status Screening     Total Score Descriptor  MMSE 26 MCI      Expected Functioning        Wide Range Achievement Test (Word Reading): Standard/Scaled Score Percentile       Word Reading 91 27      Reynolds Intellectual Screening Test Standard/T-score Percentile      Guess What 46 34      Odd Item Out 49 46  RIST Index 96 39      Cognitive Testing        RBANS, Form : Standard/Scaled Score Percentile  Total Score 78 7  Immediate  Memory 65 1      List Learning 5 5      Story Memory 3 1  Visuospatial/Constructional 131 98      Figure Copy   (20) 14 91      Judgment of Line Orientation   (20) --- >75  Language 85 16      Picture Naming --- 51-75      Semantic Fluency 4 2  Attention 64 1      Digit Span 5 5      Coding 4 2  Delayed Memory 68 2      List Recall   (0) --- <2      List Recognition   (15) --- <2      Story Recall   (5) 5 5      Figure Recall   (12) 9 37      Wechsler Adult Intelligence Scale - IV: Standard/Scaled Score Percentile  Working Memory Index 83 13  Digit Span 6 9          Digit Span Forward 8 25          Digit Span Backward 6 9          Digit Span Sequencing 6 9      Arithmetic 8 25  Processing Speed Index 74 4      Symbol Search 5 5      Coding 5 5      Neuropsychological Assessment Battery (Language Module): T-score Percentile      Naming   (30) 56 73      Verbal Fluency: T-score Percentile      Controlled Oral Word Association (F-A-S) 24 <1      Semantic Fluency (Animals) 22 <1      Trail Making Test: T-Score Percentile      Part A 56 73      Part B 43 25      Modified [place] Card Sorting Test (MWCST): Standard/T-Score Percentile      Number of Categories Correct 62 88      Number of Perseverative Errors 70 98      Number of Total Errors 69 97      Percent Perseverative Errors 67 96  Executive Function Composite 126 96      Boston Diagnostic Aphasia Exam: Raw Score Scaled Score      Complex Ideational Material 10 7      Clock Drawing Raw Score Descriptor      Command 10 WNL      Rating Scales         Raw Score Descriptor  Beck Anxiety Inventory 20 Moderate  Geriatric Depression Scale - Short Form 4 Negative   Marissa Lowrey V. Nicole Kindred PsyD, [place] Clinical Neuropsychologist

## 2021-05-03 NOTE — Telephone Encounter (Signed)
Ranexa 1000 mg # 60 x 3 refills sent to pharmacy

## 2021-05-03 NOTE — Progress Notes (Signed)
Marland Kitchen     [facility]  Patient Name: Ronald Petersen MRN: [id number] Date of Birth: Sep 21, 1956 Age: 65 y.o. Education: 9 years + (GED)  Referral Circumstances and Background Information  Ronald Petersen is a 65 y.o., right-hand dominant, married man with a history of HLD, depression, diabetes, diabetic neuropathy, spells of diminished awareness, and memory and thinking problems. He has been following with Dr. Tomi Likens since 2016, who referred him for neuropsychological re-evaluation. He was seen previously in 2017 by Dr. Malon Kindle ward at [facility] neuropsychology, who thought that his test data were characterized by marginal performance validity yet warranted a diagnosis of mental disorder NOS due to reported cognitive declines and some low test scores. On my review of the test data, it appears that he actually performed fairly well on most measures, although he did have scattered low scores on measures of verbal fluency, memory, and in other areas, which I would think raise the issue of executive control problems and perhaps a mild neurocognitive disorder level problem if they are in fact valid indicators of his performance. There were no very concerning test findings such as memory storage problems.    On interview, the patient reported that he has been having problems with memory and thinking for the past 7 - 10 years. "It's really got worse over the past few years." He had a negative experience at the past evaluation and said "I would never do this again," but he is sufficiently frustrated that he would like answers. Most of his day-to-day complaints involve difficulties with attention/concentration/executive control. He is absentminded, "works in Science writer," and takes much longer than he did in the past to do things. He has a hard time staying organized when doing projects, he will forget tools, he is constantly going back to get something. He will also get side tracked  in the middle of tasks and feels like he never finishes anything. 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 He is saying that his memory is so bad that he even forgets to eat, although that seems unlikely because he is fairly lucid today and did not present as frankly amnestic. He certainly had detailed recollection of his personal history and the results of his previous evaluations. He thinks he forgets entire events but couldn't give me an example. He denied any problems with orientation to month or year. He does have a hard time with time relationships and when things have happened in relation to each other. He reported that his wife and other family members are concerned about have noticed the changes. With respect to mood, the patient has a history of depression, and reported that dealing with his many physical issues is "like an up and down roller coaster," although he generally feels positive these days. He stated that his energy is typically low, "An hour and a half or an hour and I'm done." With respect to sleep, he was having a lot of trouble, and now he has a "sleeping pill," which helps him. He is presumably referring to the Zanaflex, which he takes at night. He estimated that he usually sleeps around 6 to 8 hours.  He is supposed to get a CPAP but he hasn't started using one yet. He generally does feel rested when he gets up.   With respect to physical symptoms, the patient has numerous complaints. He has neuropathy, weakness in his legs, balance problems, he has weakness in his hands extending up into  his arms. He feels as though he is weaker, in general, than in the past and has lost a fair amount of weight and strength (about 30lbs over the past 5 months). Diminished muscle bulk noticed generally with a diabetic body habitus. He is not trying to lose weight. He has neck and back issues and has a fair amount of pain. He estimated that his pain is, on average, around a 3 or 4, particularly if he doesn't take his medications. When I specifically asked, he said that he does have problems with walking ("I tell my legs one thing and they do another thing"). He also acknowledged shuffling when I asked. He also acknowledged a bilateral tremor. He denied any falls.   He reported that he is independent with finances. He has started to have a hard time organizing medications, but he is still doing it. In fairness he is on a great number of medications. He doesn't forget, he typically will put too many in one part of the organizer and not in another and will have to go back and start over. He was able to answer my questions about his medications satisfactorily and seemed aware of what he was taking. He feels as though his depth perception is not what it used to be, but he is still driving and is not getting lost or having any accidents. He did stop driving at night. He has no difficulties using a computer or smart phone as well as he did in the past. He is able to go out shopping and get things in the community. With respect to day-to-day activities, he "tried to stay busy," he spends a fair amount of time doing home improvement projects such as painting, etc. He is still able to cook meals, although he has to do it one thing at a time. He denied that there is anything per se that he cannot do that he used to do, although things will take him much longer. It doesn't sound like he has many complex hobbies.   Past Medical History and Review of Relevant Studies   Patient Active Problem List   Diagnosis Date Noted  .  Type 2 diabetes mellitus with hyperglycemia, without long-term current use of insulin ([place]) 01/22/2021  . Type 2 diabetes mellitus with diabetic polyneuropathy, without long-term current use of insulin ([place]) 01/22/2021  . Metabolic syndrome 16/09/9603  . Coronary artery disease 01/17/2021  . Anxiety   . Arthritis   . Cancer ([place])   . Complication of anesthesia   . GERD (gastroesophageal reflux disease)   . Hyperlipidemia   . Hypertension   . Neuromuscular disorder ([place])   . Sleep apnea   . Coronary artery disease involving native coronary artery of native heart with angina pectoris ([place]) 11/20/2020  . CAD S/P DES PCI LAD & LCx-OM 11/20/2020  . Unstable angina ([place]) 11/19/2020  . Abnormal cardiac CT angiography 11/19/2020  . Shortness of breath 10/02/2020  . Dizziness 10/02/2020  . Chest pain 10/02/2020  . Obesity (BMI 30-39.9) 10/02/2020  . Type 2 diabetes mellitus with diabetic neuropathy, without long-term current use of insulin ([place]) 06/06/2020  . Uncontrolled type 2 diabetes mellitus with hyperglycemia ([place])  05/31/2020  . Blurry vision 02/07/2020  . Spinal stenosis of lumbar region 02/07/2020  . Frequent falls 02/07/2020  . Foraminal stenosis of cervical region 07/26/2019  . Aortic atherosclerosis ([place]) 05/27/2019  . Chronic bilateral low back pain without sciatica 05/20/2019  . Carpal tunnel syndrome, bilateral 05/20/2019  . Leg weakness, bilateral 01/05/2019  . Falling episodes 01/05/2019  . Right rotator cuff tear 11/23/2018  . Neck pain 11/04/2018  . Acute pain of right shoulder 11/04/2018  . Gastroesophageal reflux disease 11/04/2018  . Hyperlipidemia associated with type 2 diabetes mellitus ([place]) 11/04/2018  . 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 Essential hypertension 09/14/2017  . Preventative health care 06/12/2016  . OSA (obstructive sleep apnea) 04/07/2016  . Joint pain 04/05/2016  . NSAID long-term use 12/13/2015   . Nausea without vomiting 12/13/2015  . History of colonic polyps 12/13/2015  . Loss of weight 12/13/2015  . Depression 11/06/2015  . Metatarsal deformity 02/20/2015  . Equinus deformity of foot, acquired 02/20/2015  . Plantar fasciitis, bilateral 02/15/2015  . Left wrist injury 06/15/2014  . Peripheral neuropathy 04/01/2012  . Fatigue 11/05/2010  . OTHER SPECIFIED DISORDER OF PENIS 03/26/2010  . NAUSEA 03/26/2010  . Pain in limb 10/17/2008  . NECK PAIN, CHRONIC 11/24/2007  . DM (diabetes mellitus) type II uncontrolled, periph vascular disorder ([place]) 01/27/2007  . Hyperlipidemia LDL goal <70 01/27/2007   Review of Neuroimaging and Relevant Medical History: The patient is denying any history of head injuries, seizures, strokes, or other neurologically significant issues in his past.   Neuropsychological evaluation report from 02/18/2016 reviewed in detail, findings as reported above. The report also incidentally notes that he was quite self-critical and has tendencies towards perfectionism. I asked him about that and he said that he used to be that way but is not now, because he realizes he cannot be.   The patient's most recent imaging is an MRI of the brain (03/18/2021), which to my eye shows mild generalized parenchymal and cerebellar volume loss. There is some slight asymmetry (L > R), although it may reflect normal variation and does not appear necessarily pathological. There is some mild ballooning of the frontal horns, but there is no corresponding appearance of disproportionate volume loss in the cortex, leading me to think this may be central. There is also a thin, dural based T1 hyperintense/T2 hypointense mass along the falx adjacent to the frontal lobes with blooming on SWI, likely meningioma, not noted by radiology and of dubious significance. There is no significant mass effect on the adjacent cortical structures. Radiology thought the volume loss had been progressive as compared  to the scan in 2016 but I do not think there is any progression beyond what would be expected in normal senescence given the 6 year interscan interval.   Patient had negative EEG in 2017.   Had a sleep study recently, showing mild sleep apnea with desaturation to a nadir of 87% and abnormal sleep architecture with delayed REM sleep and diminished REM quantity.   Current Outpatient Medications  Medication Sig Dispense Refill  . aspirin EC 81 MG tablet Take 1 tablet (81 mg total) by mouth daily. Swallow whole. 90 tablet 3  . fenofibrate 160 MG tablet TAKE 1 TABLET (160 MG TOTAL) BY MOUTH DAILY. (Patient taking differently: Take 160 mg by mouth daily at 12 noon.) 90 tablet 1  . furosemide (LASIX) 40 MG tablet TAKE 1 TABLET (40 MG TOTAL) BY MOUTH EVERY  OTHER DAY. 90 tablet 3  . gabapentin (NEURONTIN) 800 MG tablet Take 1 tablet (800 mg total) by mouth 3 (three) times daily. 270 tablet 3  . glimepiride ([name]) 2 MG tablet TAKE 1 TABLET (2 MG TOTAL) BY MOUTH DAILY BEFORE BREAKFAST. 90 tablet 3  . glucose blood test strip USE AS INSTRUCTED TO CHECK BLOOD SUGAR ONCE A DAY (Patient taking differently: USE AS INSTRUCTED TO CHECK BLOOD SUGAR ONCE A DAY) 100 strip 12  . lisinopril (ZESTRIL) 5 MG tablet TAKE 1 TABLET (5 MG TOTAL) BY MOUTH DAILY. 90 tablet 1  . metFORMIN (GLUCOPHAGE-XR) 500 MG 24 hr tablet TAKE 2 TABLETS (1,000 MG TOTAL) BY MOUTH DAILY WITH BREAKFAST AND 1 TABLET (500 MG TOTAL) DAILY WITH SUPPER. 270 tablet 3  . nitroGLYCERIN (NITROSTAT) 0.4 MG SL tablet PLACE 1 TABLET (0.4 MG TOTAL) UNDER THE TONGUE EVERY 5 (FIVE) MINUTES AS NEEDED FOR CHEST PAIN. 90 tablet 3  . omeprazole (PRILOSEC) 40 MG capsule Take 40 mg by mouth daily.    Marland Kitchen omeprazole (PRILOSEC) 40 MG capsule Take 1 capsule by mouth twice daily 180 capsule 3  . ondansetron (ZOFRAN) 4 MG tablet TAKE 1 TABLET BY MOUTH EVERY 8 HOURS AS NEEDED 20 tablet 2  . oxyCODONE-acetaminophen (PERCOCET/ROXICET) 5-325 MG tablet TAKE 1 TABLET BY MOUTH  EVERY 4 (FOUR) HOURS AS NEEDED FOR SEVERE PAIN. 120 tablet 0  . pantoprazole (PROTONIX) 40 MG tablet TAKE 1 TABLET (40 MG TOTAL) BY MOUTH DAILY. 30 tablet 3  . potassium chloride SA (KLOR-CON) 20 MEQ tablet TAKE 1 TABLET (20 MEQ TOTAL) BY MOUTH EVERY OTHER DAY. 90 tablet 3  . ranolazine (RANEXA) 1000 MG SR tablet TAKE 1 TABLET (1,000 MG TOTAL) BY MOUTH 2 (TWO) TIMES DAILY. 60 tablet 3  . rosuvastatin (CRESTOR) 40 MG tablet TAKE 1 TABLET BY MOUTH ONCE DAILY 90 tablet 3  . sennosides-docusate sodium (SENOKOT-S) 8.6-50 MG tablet Take 2 tablets by mouth daily. (Patient taking differently: Take 1 tablet by mouth daily as needed for constipation.) 30 tablet 1  . sertraline (ZOLOFT) 100 MG tablet TAKE ONE AND ONE-HALF TABLET BY MOUTH ONCE DAILY 135 tablet 3  . Suvorexant 10 MG TABS TAKE 1 TABLET BY MOUTH AT BEDTIME AS NEEDED. MAY INCREASE TO 2 TABLETS THE NEXT NIGHT IF NEEDED 30 tablet 0  . ticagrelor (BRILINTA) 90 MG TABS tablet TAKE 1 TABLET (90 MG TOTAL) BY MOUTH TWO TIMES DAILY. 180 tablet 1  . tiZANidine (ZANAFLEX) 4 MG tablet TAKE 1 TABLET (4 MG TOTAL) BY MOUTH EVERY 6 (SIX) HOURS AS NEEDED FOR MUSCLE SPASMS. 30 tablet 1  . traZODone (DESYREL) 50 MG tablet TAKE 1/2-1 TABLET (25-50 MG TOTAL) BY MOUTH AT BEDTIME AS NEEDED FOR SLEEP. 30 tablet 3   No current facility-administered medications for this visit.   Family History  Problem Relation Age of Onset  . Ovarian cancer Sister   . Stomach cancer Sister   . Heart disease Sister        MI  . Heart disease Sister        MI  . Alcohol abuse Other   . Depression Other   . Arthritis Other   . Hypertension Other   . Stomach cancer Maternal Grandmother   . COPD Mother   . Stroke Father   . Coronary artery disease Other   . Ovarian cancer Other        neice  . Ovarian cancer Other        neice  . Colon  cancer Other    There is no  family history of dementia. He did say his father had some "mini strokes/TIAs" and that he was starting to have  memory problems. There is no  family history of psychiatric illness.  Psychosocial History  Developmental, Educational and Employment History: The patient is denying any history of abuse or neglect. He stated that he was "in the woods all the time" [place]. The patient was vague about his educational history, he was held back in the 10th grade which he had previously said was due to lack of interest as per Dr. Guido Sander report. He eventually left in the 10th grade, although he said he "didn't drop out intentionally" and then told a long story about how he swore and the teachers were trying to get him out of school. 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 He retired in the midst of the [place] pandemic, related to supply chain shortages and his age.   Psychiatric History: The patient has intermittently had difficulties with depression. He stated he is on Zoloft for being "overanxious." He stated that this is for most of his life, and he has been on one antidepressant or another since his 66s. He stopped taking them for a while and his wife said that if he didn't get back on them, she would leave. He denied any counseling. He denied any suicide attempts.   Substance Use History: The patient stated that he quit drinking in September, 2021. He reported that he previously was drinking about 6 beers a day for about 6 years or so. He does not smoke cigarettes, he smoked 3 packs a day for 25 years and quit around age 45 (his mother died of emphysema).    Relationship History and Living Cimcumstances: The patient and his wife have been married for 53 years. He has a daughter and a son, and he has four grand children, which are "what keep me going."   Mental Status and Behavioral Observations  Sensorium/Arousal: The patient's level of arousal was awake  and alert. Hearing and vision were adequate for testing purposes. Orientation: The patient was fully oriented to person, place, time, and situation.  Appearance: Dressed in appropriate, casual clothing. Diabetic body habitus with central obesity and loss of muscle bulk in the arms and legs.  Behavior: Pleasant, appropriate, was a bit off-handed in some of his comments. He also did side bar during testing, perhaps distracting himself from the task at hand. As per technician, he had a very slow tempo during testing.  Speech/language: Normal in rate, rhythm, volume, and prosody. No word finding pauses or paraphasic errors.  Gait/Posture: Not formally examined Movement: No overt signs/symptoms of movement disorder, such as tremor noted, despite the patient's report of tremor.  Social Comportment: Appropriate. Was vocal that he disliked the previous evaluation and would be angry if I told him his cognitive issues are due to a 9th grade education and alcohol use.  Mood: "Pretty good" Affect: Mainly euthymic Thought process/content: Logical and goal oriented. Did have a tendency to tell stories but I think this is behavioral and likely premorbid personality as opposed to a cognitive issue. No delusional thought content.  Safety: No thoughts of harming self or others on direct questioning.  Insight: Fair, patient seems overly concerned about cognitive problems.  MMSE - Mini Mental State Exam 05/03/2021 12/25/2015  Orientation to time 5 5  Orientation to Place 5 5  Registration 3 3  Attention/ Calculation 4 4  Recall 1 1  Language- name 2 objects 2 2  Language- repeat 1 1  Language- follow 3 step command 2 3  Language- read & follow direction 1 1  Write a sentence 1 1  Copy design 1 1  Total score 26 27   Test Procedures  Wide Range Achievement Test - 4   Word Reading Reynolds Intellectual Screening Test Wechsler Adult Intelligence Scale - IV  Digit Span  Arithmetic  Symbol  Search  Coding Repeatable Battery for the Assessment of Neuropsychological Status (Form A) ACS Word Choice Green's MSVT The Dot Counting Test Controlled Oral Word Association (F-A-S) Semantic Fluency (Animals) [place] 15-Item and Recognition Trail Making Test A & B Modified Wisconsin Card Sorting Test Geriatric Depression Scale - Short Form   Plan  Ronald Petersen was seen for a psychiatric diagnostic evaluation and neuropsychological testing. He is a pleasant, 65 year old, right-hand dominant gentleman with cognitive complaints for now about a decade. He has been evaluated neuropsychologically in the past, in 2017 by Dr. Leonides Schanz, who noted marginal performance validity yet also thought he had an unspecified mental disorder. On review of his test data, it appears to be as though he had some performance consistency issues but otherwise performed fairly normally from a domain perspective, raising the issue of executive control problems. He is reporting mostly difficulties with the same day-to-day, in terms of distraction, diminished efficiency, getting side tracked. He is performing in the normal to perhaps mildly impaired range on the MMSE for his age and education. Brain MRI shows generalized parenchymal and cerebellar volume loss with no significant progression since 2016. Full and complete note with impressions, recommendations, and interpretation of test data to follow.   Ronald Petersen Ronald Kindred, PsyD, [facility] Clinical Neuropsychologist  Informed Consent  Risks and benefits of the evaluation were discussed with the patient prior to all testing procedures. I conducted a clinical interview and neuropsychological testing (at least two tests) with Ronald Petersen and Ronald Petersen, B.S. (Technician) administered additional test procedures. The patient was able to tolerate the testing procedures and the patient (and/or family if applicable) is likely to benefit from further follow up to receive the  diagnosis and treatment recommendations, which will be rendered at the next encounter.

## 2021-05-03 NOTE — Progress Notes (Signed)
   Psychometrist Note   Cognitive testing was administered to Ronald Petersen by Ronald Petersen, B.S. (Technician) under the supervision of Ronald Petersen, Psy.D., ABN. Ronald Petersen was able to tolerate all test procedures. Dr. Nicole Petersen met with the patient as needed to manage any emotional reactions to the testing procedures. Rest breaks were offered.    The battery of tests administered was selected by Dr. Nicole Petersen with consideration to the patient's current level of functioning, the nature of his symptoms, emotional and behavioral responses during the interview, level of literacy, observed level of motivation/effort, and the nature of the referral question. This battery was communicated to the psychometrist. Communication between Dr. Nicole Petersen and the psychometrist was ongoing throughout the evaluation and Dr. Nicole Petersen was immediately accessible at all times. Dr. Nicole Petersen provided supervision to the technician on the date of this service, to the extent necessary to assure the quality of all services provided.    Ronald Petersen will return in approximately one week for an interactive feedback session with Dr. Nicole Petersen, at which time test performance, clinical impressions, and treatment recommendations will be reviewed in detail. The patient understands he can contact our office should he require our assistance before this time.   A total of 135 minutes of billable time were spent with Ronald Petersen by the technician, including test administration and scoring time. Billing for these services is reflected in Ronald Petersen note.   This note reflects time spent with the psychometrician and does not include test scores, clinical history, or any interpretations made by Dr. Nicole Petersen. The full report will follow in a separate note.

## 2021-05-06 ENCOUNTER — Other Ambulatory Visit (HOSPITAL_BASED_OUTPATIENT_CLINIC_OR_DEPARTMENT_OTHER): Payer: Self-pay

## 2021-05-06 NOTE — Progress Notes (Addendum)
[facility]  Patient Name: Ronald Petersen MRN: [id number] Date of Birth: Jul 14, 1956 Age: 65 y.o. Education: 9 years  Clinical Impressions  Ronald Petersen is a 65 y.o., right-hand dominant, married man with a history of HLD, depression, diabetes, diabetic neuropathy, spells of diminished awareness (negative EEG in 2017), OSA not on CPAP (mild as per recent sleep study), and memory and thinking problems. He was evaluated neuropsychologically in 2017 by Dr. Malon Kindle Ward with impression of marginal performance validity, test taking behaviors that likely reduced his scores, and mental disorder NOS. On review of the test data, it appears as though he actually performed reasonably well in most areas and demonstrated more difficulties with performance consistency and executive control than any focal "impairment" per se. He presents for reevaluation on referral from Dr. Tomi Likens with similar complaints. He is complaining of being distractible, it takes him longer to do things, he misplaces things, and he cannot recall names. He has serial MRI from 2016 and 2022 that do show mild parenchymal and cerebellar volume loss, although much of this appears central to me and there is no clearly pathologic or diagnostic atrophy. I also do not think there is more progression than would be expected in normal senescence comparing the two series.   Neuropsychological evaluation reveals evidence of diminished performance on measures involving processing speed, working memory, and these issues may have attenuated his memory performance. His memory profile shows encoding problems with extremely low learning of verbal information and variable delayed recall. He did better recalling visual information and my sense is that this reflects attenuation of memory functioning by the above noted issues as opposed to a primary memory problem per se. He performed very well on measures of visuospatial  abilities with a very superior score despite his complaint of difficulties day-to-day. He performed well on visual object confrontation naming. He performed very well on a challenging measure of problem solving and flexibility in response to ongoing feedback. He screened negative for depression but reported a mild level of anxiety on self-report screening measures. Detailed comparison with his prior testing is not warranted given performance validity problems, although the broad pattern of difficulties he demonstrated is similar.   Ronald Petersen is thus demonstrating what appears to be a legitimate mild neurocognitive disorder, mainly involving difficulties with working memory, processing speed, and these in turn are affecting his memory encoding. The pattern of performance goes well with his day-to-day complaints. I think there is a low likelihood that this represents an incipient degenerative condition or that there is a unifying medical explanation at all. Rather, his difficulties seem likely to be multifactorial with contributions from untreated OSA, polypharmacy, aging in the setting of poorly controlled diabetes, and affective issues in the setting of preexisting personality characteristics (e.g., perfectionism). Nevertheless, it would be reasonable to monitor his condition over time to make sure nothing is being missed. Will discuss with the patient the importance of treating his OSA and improving health behaviors. He may also wish to consult with his prescribing providers regarding cognitively impairing medications (e.g., gabapentin, oxycodone, tizanidine).   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 I do think that these findings  represent a decline for you and go well with your day-to-day complaints, as they would be expected  to result in distractibility, diminished efficiency, and the other issues that you have noticed. The good news is that I do not think this is due to an underlying brain disease like Alzheimer's and instead, favor a multifactorial disorder with contributions from numerous different things. I am specifically worried about polypharmacy (I.e., too many medications), untreated sleep apnea, aging in the setting of chronic diabetes, and I think these things are interacting with your prior personality (you have tendencies toward perfectionism), resulting in the problems you are noticing day to day. This is good, because it means that your issues do not need to get worse and may in fact get better.   First, you should address treatable causes of cognitive impairment, namely sleep apnea. Sleep apnea is a major contributor to cognitive difficulties throughout life and is often insidious, in that you may not feel tired but the condition is still having a significant effect. The treatment of choice is CPAP although there are now other options. You should follow up with sleep medicine.   Second, you are on many different medications. While it may not be possible to address all your symptoms and medical conditions without some side effects, you could review your medication list with your prescribing providers to see if there are things that could be removed. The medications you are on that are most known for cognitive side effects include gabapentin, tizanidine, and oxycodone.   Third, initiating healthy lifestyle changes may help you reduce medications and will likely be beneficial anyways even if you cannot reduce your medications. You have metabolic syndrome, which is the name for a series of changes that occur when the body gets too much carbohydrate and has issues processing it. This condition involves high cholesterol,  high triglycerides, high blood pressure, diabetes, and central obesity (e.g., obesity carried in the mid section sometimes with wasting of the extremities). You can manage or at least improve many of these symptoms through healthy diet and exercise. You may wish to have a nutrition consultation. You may also want to increase exercise.   Exercise is one of the best medicines for promoting health and maintaining cognitive fitness at all stages in life. Exercise probably has the largest documented effect on brain health and performance of any lifestyle intervention. Studies have shown that even previously sedentary individuals who start exercising as late as age 50 show a significant survival benefit as compared to their non-exercising peers. In the [place], the current guidelines are for 30 minutes of moderate exercise per day, but increasing your activity level less than that may also be helpful. You do not have to get your 30 minutes of exercise in one shot and exercising for short periods of time spread throughout the day can be helpful. Go for several walks, learn to dance, or do something else you enjoy that gets your body moving. Of course, if you have an underlying medical condition or there is any question about whether it is safe for you to exercise, you should consult a medical treatment provider prior to beginning exercise.   For problems with attention and concentration, consider the following recommendations: . Build routines into your day and stick to them, doing the same thing at the same time helps your body get into a rhythm that makes it harder to forget something you need to do.  . Use sticky notes, reminders, a calendar, or your smart phone to provide yourself with reminders, to do lists, and help track appointments.  . When you  need to do work for school or other things, create an environment that is conducive to that work. This may include putting electronic devices that can be  distracting outside the room and working in an area that is quiet and free from distractions.  . Break things down into smaller steps to help get started and stop yourself from feeling overwhelmed.  . Plan breaks throughout the day where you can get up and move even if it's for just a few minutes at a time.  . Focus your attention on only one thing and avoid multitasking. Although some people are better at it than others, nobody's task performance is as good as it could be when they are alternating attention between two different tasks.  . Stay mindful throughout your day and monitor whether you are feeling overwhelmed or disorganized to identify problems before they happen.  . Avoid working under time pressure when you may be more liable to make mistakes.   Finally, it would be reasonable for you to return for reevaluation to make sure that nothing is being missed. I do not think you need to return sooner than 2 years, unless you or your providers notice significant changes in your status that warrant reevaluation.   Test Findings  Test scores are summarized in additional documentation associated with this encounter. Test scores are relative to age, gender, and educational history as available and appropriate. There were no concerns about performance validity although he did score below expectations on two embedded measures and one standalone measure. The two embedded measures are likely to reflect his working memory problems and the standalone score may reflect processing speed difficulty and approach to test taking. Accordingly, I think the findings can be interpreted with a reasonable degree of confidence.   General Intellectual Functioning/Achievement:  Performance on single word reading and on the RIST index were both average. Mr. Barradas demonstrated average scores on both the verbally and visually oriented component subtests of the RIST. Average therefore presents as a reasonable standard of  comparison for his cognitive test data.   Attention and Processing Efficiency: Performance on indicators of attention was low, likely representing a decline for this patient. He performed at the bottom of the low average range on the Working Memory Index of the WAIS-IV. Digit repetition forward was average on one measure and unusually low on another. Digit repetition backward and digit resequencing in ascending order were both unusually low. Mental solving of arithmetical word problems was a bit better and in the average range.   Mr. Jenny demonstrated low scores on processing speed measures, which likely represent a decline for him. His overall performance level on the Processing Speed Index of the WAIS-IV was unusually low. He performed at an unusually low level on two measures of timed number-symbol coding and on one measure of efficient visual matching and efficient visual scanning.   Language: Verbal fluency scores were low, which may reflect the processing speed problems noted above, because these are timed measures. He demonstrated extremely low performance on phonemic fluency and category fluency for animals. Category fluency for fruits and vegetables was unusually low. By contrast, he had good visual object confrontation naming with average scores on two different measures.   Visuospatial Function: Performance was very good on measures of visuospatial and constructional abilities with errorless performance generating a very superior score. This was true for both constructional and perceptual tasks.   Learning and Memory: Performance on measures of learning and memory was weak, although he  actually had a lower score on immediate recall and encoding than he did on delayed recall. I do not think the pattern is consistent with a primary storage problem.   In the verbal realm, Mr. Kabel' immediate recall was extremely low on the RBANS with unusually low list learning performance and extremely  low short story learning. Following a standard delay, he did not recall any of the words from the list and his performance did not benefit from recognition cuing. Delayed recall was unusually low for the short story and extremely low for the word list.   In the visual realm, he did better, with average delayed recall for a modestly complex geometric figure.   Executive Functions: Performance across the test battery shows difficulties with executive control (e.g., problems with memory encoding, working memory, and processing speed) but he does not appear to have frank "executive dysfunction" as per measures primary to this domain. Alternating sequencing of numbers and letters of the alphabet was low average. Performance was very good, in the superior range, on the Executive Function Composite of the Modified [employer]. He had very good scores for both categories completed and perseverative errors. Clock drawing was within normal limits with intact face formation, numbering, and hand placement. He performed at a low average level when reasoning with verbal information on the Complex [employer]. By contrast, he had difficulty on fluency in response to given letters, which may reflect his processing speed problems or 9th grade education.   Rating Scale(s): Mr. Valleau reported moderate levels of anxiety on self-report assessment. He scored in the negative range on a screening measure of depressive symptoms and did not present as depressed on interview.   Viviano Simas Nicole Kindred, PsyD, ABN Clinical Neuropsychologist  Coding and Compliance  Billing below reflects technician time, my direct face-to-face time with the patient, time spent in test administration, and time spent in professional activities including but not limited to: neuropsychological test interpretation, integration of neuropsychological test data with clinical history, report preparation, treatment planning, care  coordination, and review of diagnostically pertinent medical history or studies.   Services associated with this encounter: Clinical Interview [phone]) plus 155 minutes (96132/96133; Neuropsychological Evaluation by Professional)  23 minutes (96136/96137; Test Administration by Professional) 135 minutes (96138/96139; Neuropsychological Testing by Technician)

## 2021-05-07 ENCOUNTER — Telehealth: Payer: Self-pay

## 2021-05-07 ENCOUNTER — Other Ambulatory Visit (HOSPITAL_BASED_OUTPATIENT_CLINIC_OR_DEPARTMENT_OTHER): Payer: Self-pay

## 2021-05-07 NOTE — Telephone Encounter (Signed)
Pt has been notified and aware. A self reminder to follow up after his appointment set.

## 2021-05-07 NOTE — Telephone Encounter (Signed)
Pt has been notified and aware. Procedures have been cancelled at this time. Self reminder to follow up has been made to follow up after 06-10-2021.

## 2021-05-07 NOTE — Telephone Encounter (Signed)
It will be best to hold off until I see the patient. He also has not completed a full 6 months of DAPT yet.

## 2021-05-07 NOTE — Telephone Encounter (Signed)
Looks like patient has an appointment scheduled with Dr. Harriet Masson in the [facility]iology [facility] on 06/10/2021.  Probably best to wait until he is evaluated by the Cardiologist.

## 2021-05-07 NOTE — Telephone Encounter (Signed)
Ronald Petersen and Dr Bryan Lemma,  Please see the comment from the cardiologist  Would you like to proceed with the colonoscopy while on the [place]? Please advise

## 2021-05-08 NOTE — Telephone Encounter (Signed)
Thank you :)

## 2021-05-10 ENCOUNTER — Ambulatory Visit (INDEPENDENT_AMBULATORY_CARE_PROVIDER_SITE_OTHER): Payer: Medicare HMO | Admitting: Counselor

## 2021-05-10 ENCOUNTER — Other Ambulatory Visit: Payer: Self-pay

## 2021-05-10 ENCOUNTER — Encounter: Payer: Self-pay | Admitting: Counselor

## 2021-05-10 DIAGNOSIS — G3184 Mild cognitive impairment, so stated: Secondary | ICD-10-CM | POA: Diagnosis not present

## 2021-05-10 DIAGNOSIS — F0670 Mild neurocognitive disorder due to known physiological condition without behavioral disturbance: Secondary | ICD-10-CM

## 2021-05-10 NOTE — Progress Notes (Signed)
NEUROPSYCHOLOGY FEEDBACK NOTE [facility]  Feedback Note: I met with Ronald Petersen to review the findings resulting from his neuropsychological evaluation. Since the last appointment, he has been about the same. Time was spent reviewing the impressions and recommendations that are detailed in the evaluation report. We discussed impression of mild neurocognitive disorder, which is likely multifactorial with contributions from untreated OSA, polypharmacy, aging in the setting of poorly controlled diabetes etc. I was candid with Ronald Petersen that I do not think this is a neurodegenerative condition as he has been experiencing symptoms for many years and has realized little progression. I was also candid that his pre-existing perfectionism may interact with these difficulties, magnifying their emotional and functional impact. I encouraged him to adopt an attitude of self-compassion. Also discussed ways of engaging top-down control to compensate for executive difficulties. Other interventions and topics of conversation as reflected in the patient instructions. I took time to explain the findings and answer all the patient's questions. I encouraged Ronald Petersen to contact me should he have any further questions or if further follow up is desired.   Current Medications and Medical History   Current Outpatient Medications  Medication Sig Dispense Refill  . aspirin EC 81 MG tablet Take 1 tablet (81 mg total) by mouth daily. Swallow whole. 90 tablet 3  . fenofibrate 160 MG tablet TAKE 1 TABLET (160 MG TOTAL) BY MOUTH DAILY. 90 tablet 1  . furosemide (LASIX) 40 MG tablet TAKE 1 TABLET (40 MG TOTAL) BY MOUTH EVERY OTHER DAY. 90 tablet 3  . gabapentin (NEURONTIN) 800 MG tablet Take 1 tablet (800 mg total) by mouth 3 (three) times daily. 270 tablet 3  . glimepiride ([name]) 2 MG tablet TAKE 1 TABLET (2 MG TOTAL) BY MOUTH DAILY BEFORE BREAKFAST. 90 tablet 3  . glucose blood test strip USE AS INSTRUCTED TO  CHECK BLOOD SUGAR ONCE A DAY (Patient taking differently: USE AS INSTRUCTED TO CHECK BLOOD SUGAR ONCE A DAY) 100 strip 12  . lisinopril (ZESTRIL) 5 MG tablet TAKE 1 TABLET (5 MG TOTAL) BY MOUTH DAILY. 90 tablet 1  . metFORMIN (GLUCOPHAGE-XR) 500 MG 24 hr tablet TAKE 2 TABLETS (1,000 MG TOTAL) BY MOUTH DAILY WITH BREAKFAST AND 1 TABLET (500 MG TOTAL) DAILY WITH SUPPER. 270 tablet 3  . nitroGLYCERIN (NITROSTAT) 0.4 MG SL tablet PLACE 1 TABLET (0.4 MG TOTAL) UNDER THE TONGUE EVERY 5 (FIVE) MINUTES AS NEEDED FOR CHEST PAIN. 90 tablet 3  . omeprazole (PRILOSEC) 40 MG capsule Take 40 mg by mouth daily.    Marland Kitchen omeprazole (PRILOSEC) 40 MG capsule Take 1 capsule by mouth twice daily 180 capsule 3  . ondansetron (ZOFRAN) 4 MG tablet TAKE 1 TABLET BY MOUTH EVERY 8 HOURS AS NEEDED 20 tablet 2  . oxyCODONE-acetaminophen (PERCOCET/ROXICET) 5-325 MG tablet TAKE 1 TABLET BY MOUTH EVERY 4 (FOUR) HOURS AS NEEDED FOR SEVERE PAIN. 120 tablet 0  . pantoprazole (PROTONIX) 40 MG tablet TAKE 1 TABLET (40 MG TOTAL) BY MOUTH DAILY. 30 tablet 3  . potassium chloride SA (KLOR-CON) 20 MEQ tablet TAKE 1 TABLET (20 MEQ TOTAL) BY MOUTH EVERY OTHER DAY. 90 tablet 3  . ranolazine (RANEXA) 1000 MG SR tablet TAKE 1 TABLET (1,000 MG TOTAL) BY MOUTH 2 (TWO) TIMES DAILY. 60 tablet 3  . rosuvastatin (CRESTOR) 40 MG tablet TAKE 1 TABLET BY MOUTH ONCE DAILY 90 tablet 3  . sennosides-docusate sodium (SENOKOT-S) 8.6-50 MG tablet Take 2 tablets by mouth daily. (Patient taking differently: Take 1  tablet by mouth daily as needed for constipation.) 30 tablet 1  . sertraline (ZOLOFT) 100 MG tablet TAKE ONE AND ONE-HALF TABLET BY MOUTH ONCE DAILY 135 tablet 3  . Suvorexant 10 MG TABS TAKE 1 TABLET BY MOUTH AT BEDTIME AS NEEDED. MAY INCREASE TO 2 TABLETS THE NEXT NIGHT IF NEEDED 30 tablet 0  . ticagrelor (BRILINTA) 90 MG TABS tablet TAKE 1 TABLET (90 MG TOTAL) BY MOUTH TWO TIMES DAILY. 180 tablet 1  . tiZANidine (ZANAFLEX) 4 MG tablet TAKE 1 TABLET (4  MG TOTAL) BY MOUTH EVERY 6 (SIX) HOURS AS NEEDED FOR MUSCLE SPASMS. 30 tablet 1  . traZODone (DESYREL) 50 MG tablet TAKE 1/2-1 TABLET (25-50 MG TOTAL) BY MOUTH AT BEDTIME AS NEEDED FOR SLEEP. 30 tablet 3   No current facility-administered medications for this visit.    Patient Active Problem List   Diagnosis Date Noted  . Mild neurocognitive disorder due to multiple etiologies 05/10/2021  . Type 2 diabetes mellitus with hyperglycemia, without long-term current use of insulin ([place]) 01/22/2021  . Type 2 diabetes mellitus with diabetic polyneuropathy, without long-term current use of insulin ([place]) 01/22/2021  . Metabolic syndrome 41/32/4401  . Coronary artery disease 01/17/2021  . Anxiety   . Arthritis   . Cancer ([place])   . Complication of anesthesia   . GERD (gastroesophageal reflux disease)   . Hyperlipidemia   . Hypertension   . Neuromuscular disorder ([place])   . Sleep apnea   . Coronary artery disease involving native coronary artery of native heart with angina pectoris ([place]) 11/20/2020  . CAD S/P DES PCI LAD & LCx-OM 11/20/2020  . Unstable angina ([place]) 11/19/2020  . Abnormal cardiac CT angiography 11/19/2020  . Shortness of breath 10/02/2020  . Dizziness 10/02/2020  . Chest pain 10/02/2020  . Obesity (BMI 30-39.9) 10/02/2020  . Type 2 diabetes mellitus with diabetic neuropathy, without long-term current use of insulin ([place]) 06/06/2020  . Uncontrolled type 2 diabetes mellitus with hyperglycemia ([place]) 05/31/2020  . Blurry vision 02/07/2020  . Spinal stenosis of lumbar region 02/07/2020  . Frequent falls 02/07/2020  . Foraminal stenosis of cervical region 07/26/2019  . Aortic atherosclerosis ([place]) 05/27/2019  . Chronic bilateral low back pain without sciatica 05/20/2019  . Carpal tunnel syndrome, bilateral 05/20/2019  . Leg weakness, bilateral 01/05/2019  . Falling episodes 01/05/2019  . Right rotator cuff tear 11/23/2018  . Neck pain 11/04/2018  . Acute pain of right shoulder  11/04/2018  . Gastroesophageal reflux disease 11/04/2018  . Hyperlipidemia associated with type 2 diabetes mellitus ([place]) 11/04/2018  . 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 Essential hypertension 09/14/2017  . Preventative health care 06/12/2016  . OSA (obstructive sleep apnea) 04/07/2016  . Joint pain 04/05/2016  . NSAID long-term use 12/13/2015  . Nausea without vomiting 12/13/2015  . History of colonic polyps 12/13/2015  . Loss of weight 12/13/2015  . Depression 11/06/2015  . Metatarsal deformity 02/20/2015  . Equinus deformity of foot, acquired 02/20/2015  . Plantar fasciitis, bilateral 02/15/2015  . Left wrist injury 06/15/2014  . Peripheral neuropathy 04/01/2012  . Fatigue 11/05/2010  . OTHER SPECIFIED DISORDER OF PENIS 03/26/2010  . NAUSEA 03/26/2010  . Pain in limb 10/17/2008  . NECK PAIN, CHRONIC 11/24/2007  . DM (diabetes mellitus) type II uncontrolled, periph vascular disorder ([place]) 01/27/2007  . Hyperlipidemia LDL goal <70 01/27/2007    Mental Status and Behavioral Observations  Ronald Petersen presented on time to  the present encounter and was alert and generally oriented. Speech was normal in rate, rhythm, volume, and prosody. Self-reported mood was "good" and affect was euthymic. Thought process was logical and goal oriented and thought content was appropriate to the topics discussed. There were no safety concerns identified at today's encounter, such as thoughts of harming self or others.   Plan  Feedback provided regarding the patient's neuropsychological evaluation. He has a mild neurocognitive disorder, which seems most likely on the basis of modifiable risk factors. He has an appointment to get fit for a CPAP upcoming. He will consult with Dr. Etter Sjogren chase regarding his multiple medications, although it is possible that they are all absolutely necessary and there are not brain sparing alternatives. Encouraged him to remain  mindful and be patient with himself. Ronald Petersen was encouraged to contact me if any questions arise or if further follow up is desired.   Ronald Simas Nicole Kindred, PsyD, ABN Clinical Neuropsychologist  Service(s) Provided at This Encounter: 29 minutes [phone]; Conjoint therapy with patient present)

## 2021-05-10 NOTE — Patient Instructions (Signed)
Your performance and presentation on assessment today were consistent with a mild level of difficulty, mostly showing on measures of processing speed and working memory. I do think that these findings represent a decline for you and go well with your day-to-day complaints, as they would be expected to result in distractibility, diminished efficiency, and the other issues that you have noticed. The good news is that I do not think this is due to an underlying brain disease like Alzheimer's and instead, favor a multifactorial disorder with contributions from numerous different things. I am specifically worried about polypharmacy (I.e., too many medications), untreated sleep apnea, aging in the setting of chronic diabetes, and I think these things are interacting with your prior personality (you have tendencies toward perfectionism), resulting in the problems you are noticing day to day. This is good, because it means that your issues do not need to get worse and may in fact get better.   First, you should address treatable causes of cognitive impairment, namely sleep apnea. Sleep apnea is a major contributor to cognitive difficulties throughout life and is often insidious, in that you may not feel tired but the condition is still having a significant effect. The treatment of choice is CPAP although there are now other options. You should follow up with sleep medicine.   Second, you are on many different medications. While it may not be possible to address all your symptoms and medical conditions without some side effects, you could review your medication list with your prescribing providers to see if there are things that could be removed. The medications you are on that are most known for cognitive side effects include gabapentin, tizanidine, and oxycodone.   Third, initiating healthy lifestyle changes may help you reduce medications and will likely be beneficial anyways even if you cannot reduce your  medications. You have metabolic syndrome, which is the name for a series of changes that occur when the body gets too much carbohydrate and has issues processing it. This condition involves high cholesterol, high triglycerides, high blood pressure, diabetes, and central obesity (e.g., obesity carried in the mid section sometimes with wasting of the extremities). You can manage or at least improve many of these symptoms through healthy diet and exercise. You may wish to have a nutrition consultation. You may also want to increase exercise.   Exercise is one of the best medicines for promoting health and maintaining cognitive fitness at all stages in life. Exercise probably has the largest documented effect on brain health and performance of any lifestyle intervention. Studies have shown that even previously sedentary individuals who start exercising as late as age 73 show a significant survival benefit as compared to their non-exercising peers. In the [place], the current guidelines are for 30 minutes of moderate exercise per day, but increasing your activity level less than that may also be helpful. You do not have to get your 30 minutes of exercise in one shot and exercising for short periods of time spread throughout the day can be helpful. Go for several walks, learn to dance, or do something else you enjoy that gets your body moving. Of course, if you have an underlying medical condition or there is any question about whether it is safe for you to exercise, you should consult a medical treatment provider prior to beginning exercise.   For problems with attention and concentration, consider the following recommendations:  Build routines into your day and stick to them, doing the same thing at the  same time helps your body get into a rhythm that makes it harder to forget something you need to do.   Use sticky notes, reminders, a calendar, or your smart phone to provide yourself with reminders, to  do lists, and help track appointments.   When you need to do work for school or other things, create an environment that is conducive to that work. This may include putting electronic devices that can be distracting outside the room and working in an area that is quiet and free from distractions.   Break things down into smaller steps to help get started and stop yourself from feeling overwhelmed.   Plan breaks throughout the day where you can get up and move even if it's for just a few minutes at a time.   Focus your attention on only one thing and avoid multitasking. Although some people are better at it than others, nobody's task performance is as good as it could be when they are alternating attention between two different tasks.   Stay mindful throughout your day and monitor whether you are feeling overwhelmed or disorganized to identify problems before they happen.   Avoid working under time pressure when you may be more liable to make mistakes.   Finally, it would be reasonable for you to return for reevaluation to make sure that nothing is being missed. I do not think you need to return sooner than 2 years, unless you or your providers notice significant changes in your status that warrant reevaluation.

## 2021-05-15 ENCOUNTER — Encounter: Payer: Medicare HMO | Admitting: Gastroenterology

## 2021-05-21 ENCOUNTER — Other Ambulatory Visit: Payer: Self-pay | Admitting: Family Medicine

## 2021-05-21 ENCOUNTER — Other Ambulatory Visit (HOSPITAL_BASED_OUTPATIENT_CLINIC_OR_DEPARTMENT_OTHER): Payer: Self-pay

## 2021-05-21 DIAGNOSIS — M25511 Pain in right shoulder: Secondary | ICD-10-CM

## 2021-05-21 DIAGNOSIS — G8929 Other chronic pain: Secondary | ICD-10-CM

## 2021-05-21 MED ORDER — OXYCODONE-ACETAMINOPHEN 5-325 MG PO TABS
ORAL_TABLET | ORAL | 0 refills | Status: DC
  Filled 2021-05-21: qty 120, 20d supply, fill #0

## 2021-05-21 MED ORDER — TIZANIDINE HCL 4 MG PO TABS
4.0000 mg | ORAL_TABLET | Freq: Four times a day (QID) | ORAL | 1 refills | Status: DC | PRN
  Filled 2021-05-21: qty 30, 8d supply, fill #0
  Filled 2021-06-19: qty 30, 8d supply, fill #1

## 2021-05-21 MED FILL — Pantoprazole Sodium EC Tab 40 MG (Base Equiv): ORAL | 30 days supply | Qty: 30 | Fill #1 | Status: AC

## 2021-05-21 MED FILL — Furosemide Tab 40 MG: ORAL | 90 days supply | Qty: 45 | Fill #0 | Status: AC

## 2021-05-21 MED FILL — Ondansetron HCl Tab 4 MG: ORAL | 6 days supply | Qty: 20 | Fill #1 | Status: AC

## 2021-05-21 MED FILL — Potassium Chloride Microencapsulated Crys ER Tab 20 mEq: ORAL | 90 days supply | Qty: 45 | Fill #0 | Status: AC

## 2021-05-21 NOTE — Telephone Encounter (Signed)
Okay to refill? Last refilled in 2020 and last OV was 04/25/2021 with Mickel Baas. Please advise

## 2021-05-21 NOTE — Telephone Encounter (Signed)
Requesting: oxycodone 5-325mg  Contract: 03/07/2021 UDS: 03/07/2021 Last Visit: 03/07/2021 Next Visit: 06/07/2021 Last Refill: 04/23/2021 #120 and 0RF  Please Advise

## 2021-05-22 ENCOUNTER — Other Ambulatory Visit (HOSPITAL_BASED_OUTPATIENT_CLINIC_OR_DEPARTMENT_OTHER): Payer: Self-pay

## 2021-06-05 ENCOUNTER — Other Ambulatory Visit: Payer: Self-pay

## 2021-06-05 ENCOUNTER — Ambulatory Visit (HOSPITAL_BASED_OUTPATIENT_CLINIC_OR_DEPARTMENT_OTHER): Payer: Medicare HMO | Attending: Cardiovascular Disease | Admitting: Cardiovascular Disease

## 2021-06-05 DIAGNOSIS — G4733 Obstructive sleep apnea (adult) (pediatric): Secondary | ICD-10-CM | POA: Insufficient documentation

## 2021-06-05 DIAGNOSIS — G4736 Sleep related hypoventilation in conditions classified elsewhere: Secondary | ICD-10-CM

## 2021-06-07 ENCOUNTER — Other Ambulatory Visit: Payer: Self-pay

## 2021-06-07 ENCOUNTER — Ambulatory Visit (INDEPENDENT_AMBULATORY_CARE_PROVIDER_SITE_OTHER): Payer: Medicare HMO | Admitting: Family Medicine

## 2021-06-07 ENCOUNTER — Encounter: Payer: Self-pay | Admitting: Family Medicine

## 2021-06-07 VITALS — BP 122/70 | HR 56 | Temp 98.4°F | Resp 18 | Ht 72.0 in | Wt 190.6 lb

## 2021-06-07 DIAGNOSIS — N401 Enlarged prostate with lower urinary tract symptoms: Secondary | ICD-10-CM | POA: Diagnosis not present

## 2021-06-07 DIAGNOSIS — R634 Abnormal weight loss: Secondary | ICD-10-CM | POA: Diagnosis not present

## 2021-06-07 DIAGNOSIS — E11649 Type 2 diabetes mellitus with hypoglycemia without coma: Secondary | ICD-10-CM | POA: Diagnosis not present

## 2021-06-07 DIAGNOSIS — E1169 Type 2 diabetes mellitus with other specified complication: Secondary | ICD-10-CM

## 2021-06-07 DIAGNOSIS — R82998 Other abnormal findings in urine: Secondary | ICD-10-CM | POA: Diagnosis not present

## 2021-06-07 DIAGNOSIS — E785 Hyperlipidemia, unspecified: Secondary | ICD-10-CM | POA: Diagnosis not present

## 2021-06-07 DIAGNOSIS — R3911 Hesitancy of micturition: Secondary | ICD-10-CM

## 2021-06-07 DIAGNOSIS — M48061 Spinal stenosis, lumbar region without neurogenic claudication: Secondary | ICD-10-CM | POA: Diagnosis not present

## 2021-06-07 DIAGNOSIS — G894 Chronic pain syndrome: Secondary | ICD-10-CM | POA: Diagnosis not present

## 2021-06-07 DIAGNOSIS — I159 Secondary hypertension, unspecified: Secondary | ICD-10-CM

## 2021-06-07 LAB — POC URINALSYSI DIPSTICK (AUTOMATED)
Blood, UA: NEGATIVE
Glucose, UA: POSITIVE — AB
Nitrite, UA: NEGATIVE
Protein, UA: POSITIVE — AB
Spec Grav, UA: 1.02 (ref 1.010–1.025)
Urobilinogen, UA: 2 E.U./dL — AB
pH, UA: 6 (ref 5.0–8.0)

## 2021-06-07 LAB — PSA: PSA: 1.23 ng/mL (ref 0.10–4.00)

## 2021-06-07 NOTE — Assessment & Plan Note (Signed)
Database utd Check labs  Contract utd

## 2021-06-07 NOTE — Addendum Note (Signed)
Addended by: Kelle Darting A on: 06/07/2021 03:12 PM   Modules accepted: Orders

## 2021-06-07 NOTE — Patient Instructions (Addendum)
If sugar is low -- cut glimepiride in 1/2 --- will consider stopping if remains low    Chronic Pain, Adult Chronic pain is a type of pain that lasts or keeps coming back for at least 3-6 months. You may have headaches, pain in the abdomen, or pain in other areas of the body. Chronic pain may be related to an illness, such as fibromyalgia or complex regional pain syndrome. Chronic pain may also be related to an injuryor a health condition. Sometimes, the cause of chronic pain is not known. Chronic pain can make it hard for you to do daily activities. If not treated, chronic pain can lead to anxiety and depression. Treatment depends on the cause and severity of your pain. You may need to work with a pain specialist to come up with a treatment plan. The plan may include medicine, counseling, and physical therapy. Many people benefit from a combination of two or more typesof treatment to control their pain. Follow these instructions at home: Medicines Take over-the-counter and prescription medicines only as told by your health care provider. Ask your health care provider if the medicine prescribed to you: Requires you to avoid driving or using machinery. Can cause constipation. You may need to take these actions to prevent or treat constipation: Drink enough fluid to keep your urine pale yellow. Take over-the-counter or prescription medicines. Eat foods that are high in fiber, such as beans, whole grains, and fresh fruits and vegetables. Limit foods that are high in fat and processed sugars, such as fried or sweet foods. Treatment plan Follow your treatment plan as told by your health care provider. This may include: Gentle, regular exercise. Eating a healthy diet that includes foods such as vegetables, fruits, fish, and lean meats. Cognitive or behavioral therapy that changes the way you think or act in response to the pain. This may help improve how you feel. Working with a physical  therapist. Meditation, yoga, acupuncture, or massage therapy. Aroma, color, light, or sound therapy. Local electrical stimulation. The electrical pulses help to relieve pain by temporarily stopping the nerve impulses that cause you to feel pain. Injections. These deliver numbing or pain-relieving medicines into the spine or the area of pain.  Lifestyle  Ask your health care provider whether you should keep a pain diary. Your health care provider will tell you what information to write in the diary. This may include when you have pain, what the pain feels like, and how medicines and other behaviors or treatments help to reduce the pain. Consider talking with a mental health care provider about how to manage chronic pain. Consider joining a chronic pain support group. Try to control or lower your stress levels. Talk with your health care provider about ways to do this.  General instructions Learn as much as you can about how to manage your chronic pain. Ask your health care provider if an intensive pain rehabilitation program or a chronic pain specialist would be helpful. Check your pain level as told by your health care provider. Ask your health care provider if you should use a pain scale. It is up to you to get the results of any tests that were done. Ask your health care provider, or the department that is doing the tests, when your results will be ready. Keep all follow-up visits as told by your health care provider. This is important. Contact a health care provider if: Your pain gets worse, or you have new pain. You have trouble sleeping. You  have trouble doing your normal activities. Your pain is not controlled with treatment. You have side effects from pain medicine. You feel weak. You notice any other changes that show that your condition is getting worse. Get help right away if: You lose feeling or have numbness in your body. You lose control of bowel or bladder function. Your  pain suddenly gets much worse. You develop shaking or chills. You develop confusion. You develop chest pain. You have trouble breathing or shortness of breath. You pass out. You have thoughts about hurting yourself or others. If you ever feel like you may hurt yourself or others, or have thoughts about taking your own life, get help right away. Go to your nearest emergency department or: Call your local emergency services (911 in the [place] Call a suicide crisis helpline, such as the [facility] at (435)566-3865. This is open 24 hours a day in the [place] Text the Crisis Text Line at (873) 175-2347 (in the [place].). Summary Chronic pain is a type of pain that lasts or keeps coming back for at least 3-6 months. Chronic pain may be related to an illness, injury, or other health condition. Sometimes, the cause of chronic pain is not known. Treatment depends on the cause and severity of your pain. Many people benefit from a combination of two or more types of treatment to control their pain. Follow your treatment plan as told by your health care provider. This information is not intended to replace advice given to you by your health care provider. Make sure you discuss any questions you have with your healthcare provider. Document Revised: 09/01/2019 Document Reviewed: 09/01/2019 Elsevier Patient Education  [place].

## 2021-06-07 NOTE — Progress Notes (Signed)
Patient ID: Ronald Petersen, male    DOB: [birth year]  Age: 65 y.o. MRN: [id number]    Subjective:  Subjective  HPI Ronald Petersen presents for an office visit today. He complains of chronic back and neck pain. He endorses taking 4 mg of tiZANidine PO Daily and 5-325 oxyCODONE-acetaminophen PO Daily for his pain.  He was at the ED on 04/25/2021 for lower abdominal pain, however the symptoms are improving.  He also complains of weight lost. He notes that he is not trying to lose weight, however his weight had decrease.  He reports that he has been eating his normal diet. His at home weight is 188 lbs.  Wt Readings from Last 3 Encounters:  06/07/21 190 lb 9.6 oz (86.5 kg)  06/05/21 188 lb (85.3 kg)  05/01/21 192 lb (87.1 kg)  He notes that he had reduce 500 mg of metformin PO Daily for his dx of type 2 diabetes. He reports that 2 mg of glimepiride PO Daily for his dx of type 2 diabetes had reduce his blood sugar level to 40-50.  Lab Results  Component Value Date   HGBA1C 6.2 (A) 01/22/2021  He denies any chest pain, SOB, fever, cough, chills, sore throat, dysuria, urinary incontinence, HA, or N/V/D at this time.   Review of Systems  Constitutional:  Positive for unexpected weight change (loss). Negative for chills, fatigue and fever.  HENT:  Negative for ear pain, rhinorrhea, sinus pressure, sinus pain, sore throat and tinnitus.   Eyes:  Negative for pain.  Respiratory:  Negative for cough, shortness of breath and wheezing.   Cardiovascular:  Negative for chest pain.  Gastrointestinal:  Negative for abdominal pain, anal bleeding, constipation, diarrhea, nausea and vomiting.  Genitourinary:  Negative for flank pain.  Musculoskeletal:  Positive for back pain and neck pain.  Skin:  Negative for rash.  Neurological:  Negative for seizures, weakness, light-headedness, numbness and headaches.   History Past Medical History:  Diagnosis Date   Anxiety    Arthritis    Cancer ([place])     Complication of anesthesia    aspirated with back surgery at age 47   Depression    Diabetes mellitus    GERD (gastroesophageal reflux disease)    Hyperlipidemia    Hypertension    Neuromuscular disorder ([place])    NEUROPATHY   Right rotator cuff tear 11/23/2018   Sleep apnea    no CPAP    He has a past surgical history that includes Appendectomy; Cholecystectomy; Lumbar laminectomy; Shoulder arthroscopy with rotator cuff repair and subacromial decompression (Right, 11/23/2018); Arthroscopic rotator cuff repair (Right, 11/23/2018); LEFT HEART CATH AND CORONARY ANGIOGRAPHY (N/A, 11/19/2020); Intravascular Ultrasound/IVUS (N/A, 11/19/2020); CORONARY STENT INTERVENTION (N/A, 11/19/2020); and LEFT HEART CATH AND CORONARY ANGIOGRAPHY (N/A, 12/24/2020).   His family history includes Alcohol abuse in an other family member; Arthritis in an other family member; COPD in his mother; Colon cancer in an other family member; Coronary artery disease in an other family member; Depression in an other family member; Heart disease in his sister and sister; Hypertension in an other family member; Ovarian cancer in his sister and other family members; Stomach cancer in his maternal grandmother and sister; Stroke in his father.He reports that he quit smoking about 35 years ago. His smoking use included cigarettes. He has never used smokeless tobacco. He reports current alcohol use of about 4.0 - 5.0 standard drinks of alcohol per week. He reports that he does not use drugs.  Current  Outpatient Medications on File Prior to Visit  Medication Sig Dispense Refill   aspirin EC 81 MG tablet Take 1 tablet (81 mg total) by mouth daily. Swallow whole. 90 tablet 3   fenofibrate 160 MG tablet TAKE 1 TABLET (160 MG TOTAL) BY MOUTH DAILY. 90 tablet 1   furosemide (LASIX) 40 MG tablet TAKE 1 TABLET (40 MG TOTAL) BY MOUTH EVERY OTHER DAY. 90 tablet 3   gabapentin (NEURONTIN) 800 MG tablet Take 1 tablet (800 mg total) by mouth 3  (three) times daily. 270 tablet 2   glimepiride ([name]) 2 MG tablet TAKE 1 TABLET (2 MG TOTAL) BY MOUTH DAILY BEFORE BREAKFAST. 90 tablet 3   glucose blood test strip USE AS INSTRUCTED TO CHECK BLOOD SUGAR ONCE A DAY (Patient taking differently: USE AS INSTRUCTED TO CHECK BLOOD SUGAR ONCE A DAY) 100 strip 12   lisinopril (ZESTRIL) 5 MG tablet TAKE 1 TABLET (5 MG TOTAL) BY MOUTH DAILY. 90 tablet 1   metFORMIN (GLUCOPHAGE-XR) 500 MG 24 hr tablet TAKE 2 TABLETS (1,000 MG TOTAL) BY MOUTH DAILY WITH BREAKFAST AND 1 TABLET (500 MG TOTAL) DAILY WITH SUPPER. 270 tablet 3   nitroGLYCERIN (NITROSTAT) 0.4 MG SL tablet PLACE 1 TABLET (0.4 MG TOTAL) UNDER THE TONGUE EVERY 5 (FIVE) MINUTES AS NEEDED FOR CHEST PAIN. 90 tablet 3   omeprazole (PRILOSEC) 40 MG capsule Take 1 capsule by mouth twice daily 180 capsule 3   ondansetron (ZOFRAN) 4 MG tablet TAKE 1 TABLET BY MOUTH EVERY 8 HOURS AS NEEDED 20 tablet 2   oxyCODONE-acetaminophen (PERCOCET/ROXICET) 5-325 MG tablet TAKE 1 TABLET BY MOUTH EVERY 4 (FOUR) HOURS AS NEEDED FOR SEVERE PAIN. 120 tablet 0   pantoprazole (PROTONIX) 40 MG tablet TAKE 1 TABLET (40 MG TOTAL) BY MOUTH DAILY. 30 tablet 3   potassium chloride SA (KLOR-CON) 20 MEQ tablet TAKE 1 TABLET (20 MEQ TOTAL) BY MOUTH EVERY OTHER DAY. 90 tablet 3   ranolazine (RANEXA) 1000 MG SR tablet TAKE 1 TABLET (1,000 MG TOTAL) BY MOUTH 2 (TWO) TIMES DAILY. 60 tablet 3   rosuvastatin (CRESTOR) 40 MG tablet TAKE 1 TABLET BY MOUTH ONCE DAILY 90 tablet 3   sennosides-docusate sodium (SENOKOT-S) 8.6-50 MG tablet Take 2 tablets by mouth daily. (Patient taking differently: Take 1 tablet by mouth daily as needed for constipation.) 30 tablet 1   sertraline (ZOLOFT) 100 MG tablet TAKE ONE AND ONE-HALF TABLET BY MOUTH ONCE DAILY 135 tablet 3   Suvorexant 10 MG TABS TAKE 1 TABLET BY MOUTH AT BEDTIME AS NEEDED. MAY INCREASE TO 2 TABLETS THE NEXT NIGHT IF NEEDED 30 tablet 0   ticagrelor (BRILINTA) 90 MG TABS tablet TAKE 1 TABLET  (90 MG TOTAL) BY MOUTH TWO TIMES DAILY. 180 tablet 1   tiZANidine (ZANAFLEX) 4 MG tablet TAKE 1 TABLET (4 MG TOTAL) BY MOUTH EVERY 6 (SIX) HOURS AS NEEDED FOR MUSCLE SPASMS. 30 tablet 1   traZODone (DESYREL) 50 MG tablet TAKE 1/2-1 TABLET (25-50 MG TOTAL) BY MOUTH AT BEDTIME AS NEEDED FOR SLEEP. 30 tablet 3   [DISCONTINUED] isosorbide mononitrate (IMDUR) 30 MG 24 hr tablet Take 1 tablet (30 mg total) by mouth daily. 90 tablet 1   [DISCONTINUED] metoprolol tartrate (LOPRESSOR) 100 MG tablet Take 1 tablet (100 mg total) by mouth once for 1 dose. Take two hours prior to your cardiac CT (Patient taking differently: Take 100 mg by mouth daily. ) 1 tablet 0   No current facility-administered medications on file prior to visit.  Objective:  Objective  Physical Exam Vitals and nursing note reviewed.  Constitutional:      General: He is not in acute distress.    Appearance: Normal appearance. He is well-developed. He is not ill-appearing.  HENT:     Head: Normocephalic and atraumatic.     Right Ear: External ear normal.     Left Ear: External ear normal.     Nose: Nose normal.  Eyes:     General:        Right eye: No discharge.        Left eye: No discharge.     Extraocular Movements: Extraocular movements intact.     Pupils: Pupils are equal, round, and reactive to light.  Cardiovascular:     Rate and Rhythm: Normal rate and regular rhythm.     Pulses: Normal pulses.     Heart sounds: Normal heart sounds. No murmur heard.   No friction rub. No gallop.  Pulmonary:     Effort: Pulmonary effort is normal. No respiratory distress.     Breath sounds: Normal breath sounds. No stridor. No wheezing, rhonchi or rales.  Chest:     Chest wall: No tenderness.  Abdominal:     General: Bowel sounds are normal. There is no distension.     Palpations: Abdomen is soft. There is no mass.     Tenderness: no abdominal tenderness There is no guarding or rebound.     Hernia: No hernia is present.   Musculoskeletal:        General: Normal range of motion.     Cervical back: Normal range of motion and neck supple.     Right lower leg: No edema.     Left lower leg: No edema.  Skin:    General: Skin is warm and dry.  Neurological:     Mental Status: He is alert and oriented to person, place, and time.  Psychiatric:        Behavior: Behavior normal.        Thought Content: Thought content normal.   BP 122/70 (BP Location: Right Arm, Patient Position: Sitting, Cuff Size: Normal)   Pulse (!) 56   Temp 98.4 F (36.9 C) (Oral)   Resp 18   Ht 6' (1.829 m)   Wt 190 lb 9.6 oz (86.5 kg)   SpO2 99%   BMI 25.85 kg/m  Wt Readings from Last 3 Encounters:  06/07/21 190 lb 9.6 oz (86.5 kg)  06/05/21 188 lb (85.3 kg)  05/01/21 192 lb (87.1 kg)     Lab Results  Component Value Date   WBC 6.5 04/25/2021   HGB 13.9 04/25/2021   HCT 40.9 04/25/2021   PLT 197 04/25/2021   GLUCOSE 89 04/25/2021   CHOL 87 03/07/2021   TRIG 127.0 03/07/2021   HDL 32.30 (L) 03/07/2021   LDLDIRECT 93.0 05/31/2020   LDLCALC 29 03/07/2021   ALT 16 04/25/2021   AST 20 04/25/2021   NA 134 (L) 04/25/2021   K 3.7 04/25/2021   CL 100 04/25/2021   CREATININE 0.81 04/25/2021   BUN 17 04/25/2021   CO2 24 04/25/2021   TSH 1.82 02/26/2021   PSA 1.38 02/09/2019   HGBA1C 6.2 (A) 01/22/2021   MICROALBUR 1.7 05/31/2020    DG Chest 2 View  Result Date: 04/25/2021 CLINICAL DATA:  65 year old male with chest pain and shortness of breath. Weight loss EXAM: CHEST - 2 VIEW COMPARISON:  Chest radiograph dated 12/23/2020. FINDINGS: No focal consolidation, pleural effusion,  or pneumothorax. The cardiac silhouette is within limits. Coronary vascular calcification and atherosclerotic calcification of the aortic arch. No acute osseous pathology. Old right lateral rib fractures. IMPRESSION: No active cardiopulmonary disease. Electronically Signed   By: Anner Crete M.D.   On: 04/25/2021 17:19   CT Abdomen Pelvis W  Contrast  Result Date: 04/25/2021 CLINICAL DATA:  Left chest pressure and lower abdominal pain for a few weeks. Nausea and shortness of breath. EXAM: CT ABDOMEN AND PELVIS WITH CONTRAST TECHNIQUE: Multidetector CT imaging of the abdomen and pelvis was performed using the standard protocol following bolus administration of intravenous contrast. CONTRAST:  185mL OMNIPAQUE IOHEXOL 300 MG/ML  SOLN COMPARISON:  05/27/2019 FINDINGS: Lower chest: Lung bases are clear. Aortic and coronary artery calcifications. Hepatobiliary: No focal liver abnormality is seen. Status post cholecystectomy. No biliary dilatation. Pancreas: Unremarkable. No pancreatic ductal dilatation or surrounding inflammatory changes. Spleen: Normal in size without focal abnormality. Adrenals/Urinary Tract: Adrenal glands are unremarkable. Kidneys are normal, without renal calculi, focal lesion, or hydronephrosis. Mild diffuse bladder wall thickening may indicate cystitis. No filling defect or bladder stone. Stomach/Bowel: Stomach, small bowel, and colon are not abnormally distended. No wall thickening or inflammatory changes. Stool fills the colon. Appendix is not identified. Vascular/Lymphatic: Aortic atherosclerosis. No enlarged abdominal or pelvic lymph nodes. Reproductive: Prostate gland is mildly enlarged, measuring 4.8 cm diameter. Other: No free air or free fluid in the abdomen. Minimal periumbilical hernia containing fat. Musculoskeletal: Degenerative changes in the spine. No destructive bone lesions. IMPRESSION: 1. Mild diffuse bladder wall thickening may indicate cystitis. 2. No evidence of bowel obstruction or inflammation. 3. Mildly enlarged prostate gland. 4. Minimal periumbilical hernia containing fat. Aortic Atherosclerosis (ICD10-I70.0). Electronically Signed   By: Lucienne Capers M.D.   On: 04/25/2021 19:16     Assessment & Plan:  Plan   No orders of the defined types were placed in this encounter.   Problem List Items  Addressed This Visit       Unprioritized   Hyperlipidemia associated with type 2 diabetes mellitus ([place]) (Chronic)    Encourage heart healthy diet such as MIND or DASH diet, increase exercise, avoid trans fats, simple carbohydrates and processed foods, consider a krill or fish or flaxseed oil cap daily.        Chronic pain syndrome - Primary    Database utd Check labs  Contract utd        Hypertension    Well controlled, no changes to meds. Encouraged heart healthy diet such as the DASH diet and exercise as tolerated.         Spinal stenosis of lumbar region    con't pain med Uds/ contract utd  Database utd        Type II diabetes mellitus with hypoglycemia ([place])    Cut amaryl in 1/2 will consider stoppint Has f/u endo in August        Weight loss    Waiting for gi -- colonoscopy       Other Visit Diagnoses     Benign prostatic hyperplasia with urinary hesitancy       Relevant Orders   PSA   POCT Urinalysis Dipstick (Automated) (Completed)       Follow-up: Return in about 3 months (around 09/07/2021) for hypertension, hyperlipidemia, diabetes II.   I,Gordon Zheng,acting as a scribe for [name], DO.,have documented all relevant documentation on the behalf of [name], DO,as directed by  [name], DO while  in the presence of Shannon, DO, have reviewed all documentation for this visit. The documentation on 06/07/21 for the exam, diagnosis, procedures, and orders are all accurate and complete.

## 2021-06-07 NOTE — Assessment & Plan Note (Signed)
con't pain med Uds/ contract utd  Database utd

## 2021-06-07 NOTE — Assessment & Plan Note (Signed)
Cut amaryl in 1/2 will consider stoppint Has f/u endo in August

## 2021-06-07 NOTE — Assessment & Plan Note (Signed)
Waiting for gi -- colonoscopy

## 2021-06-07 NOTE — Assessment & Plan Note (Signed)
Encourage heart healthy diet such as MIND or DASH diet, increase exercise, avoid trans fats, simple carbohydrates and processed foods, consider a krill or fish or flaxseed oil cap daily.  °

## 2021-06-07 NOTE — Assessment & Plan Note (Signed)
Well controlled, no changes to meds. Encouraged heart healthy diet such as the DASH diet and exercise as tolerated.  °

## 2021-06-08 LAB — URINE CULTURE
MICRO NUMBER:: 11993886
Result:: NO GROWTH
SPECIMEN QUALITY:: ADEQUATE

## 2021-06-10 ENCOUNTER — Ambulatory Visit (INDEPENDENT_AMBULATORY_CARE_PROVIDER_SITE_OTHER): Payer: Medicare HMO | Admitting: Cardiology

## 2021-06-10 ENCOUNTER — Encounter: Payer: Self-pay | Admitting: Cardiology

## 2021-06-10 ENCOUNTER — Other Ambulatory Visit: Payer: Self-pay

## 2021-06-10 VITALS — BP 108/72 | HR 60 | Ht 72.0 in | Wt 193.1 lb

## 2021-06-10 DIAGNOSIS — I10 Essential (primary) hypertension: Secondary | ICD-10-CM

## 2021-06-10 DIAGNOSIS — E1169 Type 2 diabetes mellitus with other specified complication: Secondary | ICD-10-CM | POA: Diagnosis not present

## 2021-06-10 DIAGNOSIS — I25119 Atherosclerotic heart disease of native coronary artery with unspecified angina pectoris: Secondary | ICD-10-CM | POA: Diagnosis not present

## 2021-06-10 DIAGNOSIS — E1151 Type 2 diabetes mellitus with diabetic peripheral angiopathy without gangrene: Secondary | ICD-10-CM | POA: Diagnosis not present

## 2021-06-10 DIAGNOSIS — E1165 Type 2 diabetes mellitus with hyperglycemia: Secondary | ICD-10-CM

## 2021-06-10 DIAGNOSIS — E785 Hyperlipidemia, unspecified: Secondary | ICD-10-CM

## 2021-06-10 DIAGNOSIS — IMO0002 Reserved for concepts with insufficient information to code with codable children: Secondary | ICD-10-CM

## 2021-06-10 NOTE — Patient Instructions (Signed)
Medication Instructions:  Your physician recommends that you continue on your current medications as directed. Please refer to the Current Medication list given to you today.  *If you need a refill on your cardiac medications before your next appointment, please call your pharmacy*   Lab Work: Your physician recommends that you return for lab work: TODAY: BMET, [place] If you have labs (blood work) drawn today and your tests are completely normal, you will receive your results only by: . MyChart Message (if you have MyChart) OR . A paper copy in the mail If you have any lab test that is abnormal or we need to change your treatment, we will call you to review the results.   Testing/Procedures: None   Follow-Up: At [facility], you and your health needs are our priority.  As part of our continuing mission to provide you with exceptional heart care, we have created designated Provider Care Teams.  These Care Teams include your primary Cardiologist (physician) and Advanced Practice Providers (APPs -  Physician Assistants and Nurse Practitioners) who all work together to provide you with the care you need, when you need it.  We recommend signing up for the patient portal called "MyChart".  Sign up information is provided on this After Visit Summary.  MyChart is used to connect with patients for Virtual Visits (Telemedicine).  Patients are able to view lab/test results, encounter notes, upcoming appointments, etc.  Non-urgent messages can be sent to your provider as well.   To learn more about what you can do with MyChart, go to [email or web address].    Your next appointment:   6 month(s)  The format for your next appointment:   In Person  Provider:   Kardie Tobb, DO   Other Instructions   

## 2021-06-10 NOTE — Progress Notes (Signed)
Cardiology Office Note:    Date:  06/10/2021   ID:  Ronald Petersen, DOB [birth year], MRN [id number]  PCP:  [name], Alferd Apa, DO  Cardiologist:  Berniece Salines, DO  Electrophysiologist:  None   Referring MD: [name], Alferd Apa, *   No chief complaint on file. I am doing well  History of Present Illness:    Ronald Petersen is a 65 y.o. 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    During that time his Imdur was stopped and I started him on Ranexa.  I have also increase his Ranexa to 500 mg twice a day.   In December he was seen at the emergency department [facility] he subsequently underwent a left heart catheterization given the fact that he had had intermittent worsening chest pain.    I saw the patient on December 30, 2018 at that time he was willing to get back on the Imdur but he tells me he started when he started to feel tired in lots of headache so he had stopped this medication.  He continue his Ranexa.  I saw the patient on March 05, 2021 at that time he had been experiencing shortness of breath.  I started him on Lasix 40 mg every other day to see if this is going to help.  Said I saw him his sleep study reported obstructive sleep apnea and was pending CPAP titration.    Past Medical History:  Diagnosis Date   Anxiety    Arthritis    Cancer ([place])    Complication of anesthesia    aspirated with back surgery at age 40   Depression    Diabetes mellitus    GERD (gastroesophageal reflux disease)    Hyperlipidemia    Hypertension    Neuromuscular disorder ([place])    NEUROPATHY   Right rotator cuff tear  11/23/2018   Sleep apnea    no CPAP    Past Surgical History:  Procedure Laterality Date   APPENDECTOMY     ARTHOSCOPIC ROTAOR CUFF REPAIR Right 11/23/2018   Procedure: ARTHROSCOPIC ROTATOR CUFF REPAIR;  Surgeon: [name], MD;  Location: [place];  Service: Orthopedics;  Laterality: Right;   CHOLECYSTECTOMY     CORONARY STENT INTERVENTION N/A 11/19/2020   Procedure: CORONARY STENT INTERVENTION;  Surgeon: Nelva Bush, MD;  Location: [place] CV LAB;  Service: Cardiovascular;  Laterality: N/A;   INTRAVASCULAR ULTRASOUND/IVUS N/A 11/19/2020   Procedure: Intravascular Ultrasound/IVUS;  Surgeon: Nelva Bush, MD;  Location: [place] CV LAB;  Service: Cardiovascular;  Laterality: N/A;   LEFT HEART CATH AND CORONARY ANGIOGRAPHY N/A 11/19/2020   Procedure: LEFT HEART CATH AND CORONARY ANGIOGRAPHY;  Surgeon: Nelva Bush, MD;  Location: [place] [facility];  Service: Cardiovascular;  Laterality: N/A;   LEFT HEART CATH AND CORONARY ANGIOGRAPHY N/A 12/24/2020   Procedure: LEFT HEART CATH AND CORONARY ANGIOGRAPHY;  Surgeon: Martinique, Peter M, MD;  Location: [place] [facility];  Service: Cardiovascular;  Laterality: N/A;   LUMBAR LAMINECTOMY     SHOULDER ARTHROSCOPY WITH ROTATOR CUFF REPAIR AND SUBACROMIAL DECOMPRESSION Right 11/23/2018  Procedure: SHOULDER ARTHROSCOPY WITH ROTATOR CUFF REPAIR AND SUBACROMIAL DECOMPRESSION;  Surgeon: [name], MD;  Location: [place];  Service: Orthopedics;  Laterality: Right;    Current Medications: Current Meds  Medication Sig   aspirin EC 81 MG tablet Take 1 tablet (81 mg total) by mouth daily. Swallow whole.   fenofibrate 160 MG tablet TAKE 1 TABLET (160 MG TOTAL) BY MOUTH DAILY.   furosemide (LASIX) 40 MG tablet TAKE 1 TABLET (40 MG TOTAL) BY MOUTH EVERY OTHER DAY.   gabapentin (NEURONTIN) 800 MG tablet Take 1 tablet (800 mg total) by mouth 3 (three) times daily.   glimepiride ([name]) 2 MG tablet  TAKE 1 TABLET (2 MG TOTAL) BY MOUTH DAILY BEFORE BREAKFAST.   glucose blood test strip USE AS INSTRUCTED TO CHECK BLOOD SUGAR ONCE A DAY (Patient taking differently: USE AS INSTRUCTED TO CHECK BLOOD SUGAR ONCE A DAY)   lisinopril (ZESTRIL) 5 MG tablet TAKE 1 TABLET (5 MG TOTAL) BY MOUTH DAILY.   metFORMIN (GLUCOPHAGE-XR) 500 MG 24 hr tablet TAKE 2 TABLETS (1,000 MG TOTAL) BY MOUTH DAILY WITH BREAKFAST AND 1 TABLET (500 MG TOTAL) DAILY WITH SUPPER.   nitroGLYCERIN (NITROSTAT) 0.4 MG SL tablet PLACE 1 TABLET (0.4 MG TOTAL) UNDER THE TONGUE EVERY 5 (FIVE) MINUTES AS NEEDED FOR CHEST PAIN.   omeprazole (PRILOSEC) 40 MG capsule Take 1 capsule by mouth twice daily   ondansetron (ZOFRAN) 4 MG tablet TAKE 1 TABLET BY MOUTH EVERY 8 HOURS AS NEEDED   oxyCODONE-acetaminophen (PERCOCET/ROXICET) 5-325 MG tablet TAKE 1 TABLET BY MOUTH EVERY 4 (FOUR) HOURS AS NEEDED FOR SEVERE PAIN.   pantoprazole (PROTONIX) 40 MG tablet TAKE 1 TABLET (40 MG TOTAL) BY MOUTH DAILY.   potassium chloride SA (KLOR-CON) 20 MEQ tablet TAKE 1 TABLET (20 MEQ TOTAL) BY MOUTH EVERY OTHER DAY.   ranolazine (RANEXA) 1000 MG SR tablet TAKE 1 TABLET (1,000 MG TOTAL) BY MOUTH 2 (TWO) TIMES DAILY.   rosuvastatin (CRESTOR) 40 MG tablet TAKE 1 TABLET BY MOUTH ONCE DAILY   sennosides-docusate sodium (SENOKOT-S) 8.6-50 MG tablet Take 2 tablets by mouth daily. (Patient taking differently: Take 1 tablet by mouth daily as needed for constipation.)   sertraline (ZOLOFT) 100 MG tablet TAKE ONE AND ONE-HALF TABLET BY MOUTH ONCE DAILY   Suvorexant 10 MG TABS TAKE 1 TABLET BY MOUTH AT BEDTIME AS NEEDED. MAY INCREASE TO 2 TABLETS THE NEXT NIGHT IF NEEDED   ticagrelor (BRILINTA) 90 MG TABS tablet TAKE 1 TABLET (90 MG TOTAL) BY MOUTH TWO TIMES DAILY.   tiZANidine (ZANAFLEX) 4 MG tablet TAKE 1 TABLET (4 MG TOTAL) BY MOUTH EVERY 6 (SIX) HOURS AS NEEDED FOR MUSCLE SPASMS.   traZODone (DESYREL) 50 MG tablet TAKE 1/2-1 TABLET (25-50 MG TOTAL) BY MOUTH AT BEDTIME AS  NEEDED FOR SLEEP.     Allergies:   Niacin   Social History   Socioeconomic History   Marital status: Married    Spouse name: Lorriane Shire   Number of children: 2   Years of education: Not on file   Highest education level: Not on file  Occupational History   Occupation: self employed    Employer: UNEMPLOYED  Tobacco Use   Smoking status: Former    Years: 16.00    Pack years: 0.00    Types: Cigarettes    Quit date: 12/29/1985    Years since quitting: 35.4   Smokeless tobacco: Never  Vaping Use   Vaping Use: Never used  Substance and Sexual Activity  Alcohol use: Yes    Alcohol/week: 4.0 - 5.0 standard drinks    Types: 4 - 5 Cans of beer per week    Comment: 5 times per week   Drug use: No   Sexual activity: Yes    Partners: Female  Other Topics Concern   Not on file  Social History Narrative   Exercise-- 3 days   Pt has hs degree   Right handed   One story home   Drinks caffeine   Social Determinants of Health   Financial Resource Strain: Not on file  Food Insecurity: Not on file  Transportation Needs: Not on file  Physical Activity: Not on file  Stress: Not on file  Social Connections: Not on file     Family History: The patient's family history includes Alcohol abuse in an other family member; Arthritis in an other family member; COPD in his mother; Colon cancer in an other family member; Coronary artery disease in an other family member; Depression in an other family member; Heart disease in his sister and sister; Hypertension in an other family member; Ovarian cancer in his sister and other family members; Stomach cancer in his maternal grandmother and sister; Stroke in his father.  ROS:   Review of Systems  Constitution: Negative for decreased appetite, fever and weight gain.  HENT: Negative for congestion, ear discharge, hoarse voice and sore throat.   Eyes: Negative for discharge, redness, vision loss in right eye and visual halos.  Cardiovascular:  Negative for chest pain, dyspnea on exertion, leg swelling, orthopnea and palpitations.  Respiratory: Negative for cough, hemoptysis, shortness of breath and snoring.   Endocrine: Negative for heat intolerance and polyphagia.  Hematologic/Lymphatic: Negative for bleeding problem. Does not bruise/bleed easily.  Skin: Negative for flushing, nail changes, rash and suspicious lesions.  Musculoskeletal: Negative for arthritis, joint pain, muscle cramps, myalgias, neck pain and stiffness.  Gastrointestinal: Negative for abdominal pain, bowel incontinence, diarrhea and excessive appetite.  Genitourinary: Negative for decreased libido, genital sores and incomplete emptying.  Neurological: Negative for brief paralysis, focal weakness, headaches and loss of balance.  Psychiatric/Behavioral: Negative for altered mental status, depression and suicidal ideas.  Allergic/Immunologic: Negative for HIV exposure and persistent infections.    EKGs/Labs/Other Studies Reviewed:    The following studies were reviewed today:   EKG:  None today   LHC 11/2021 1st Diag lesion is 25% stenosed. Previously placed Prox LAD to Mid LAD drug eluting stent is widely patent. Previously placed 1st Mrg drug eluting stent is widely patent. Previously placed Prox Cx drug-eluting stents are widely patent. RPAV lesion is 50% stenosed. LV end diastolic pressure is normal.   1. Nonobstructive CAD. Excellent patency of stents in the LAD and LCx. Lesion in the PL branch reduced from 70% to 50%. 2. Normal LV filling pressures.   Plan: continue medical management. Anticipate DC today.   Recent Labs: 01/17/2021: Magnesium 1.9 02/26/2021: TSH 1.82 04/25/2021: ALT 16; BUN 17; Creatinine, Ser 0.81; Hemoglobin 13.9; Platelets 197; Potassium 3.7; Sodium 134  Recent Lipid Panel    Component Value Date/Time   CHOL 87 03/07/2021 1133   TRIG 127.0 03/07/2021 1133   TRIG 148 12/10/2006 1603   HDL 32.30 (L) 03/07/2021 1133   CHOLHDL  3 03/07/2021 1133   VLDL 25.4 03/07/2021 1133   LDLCALC 29 03/07/2021 1133   LDLCALC 96 09/06/2020 1138   LDLDIRECT 93.0 05/31/2020 0910    Physical Exam:    VS:  BP 108/72   Pulse 60  Ht 6' (1.829 m)   Wt 193 lb 1.6 oz (87.6 kg)   SpO2 98%   BMI 26.19 kg/m     Wt Readings from Last 3 Encounters:  06/10/21 193 lb 1.6 oz (87.6 kg)  06/07/21 190 lb 9.6 oz (86.5 kg)  06/05/21 188 lb (85.3 kg)     GEN: Well nourished, well developed in no acute distress HEENT: Normal NECK: No JVD; No carotid bruits LYMPHATICS: No lymphadenopathy CARDIAC: S1S2 noted,RRR, no murmurs, rubs, gallops RESPIRATORY:  Clear to auscultation without rales, wheezing or rhonchi  ABDOMEN: Soft, non-tender, non-distended, +bowel sounds, no guarding. EXTREMITIES: No edema, No cyanosis, no clubbing MUSCULOSKELETAL:  No deformity  SKIN: Warm and dry NEUROLOGIC:  Alert and oriented x 3, non-focal PSYCHIATRIC:  Normal affect, good insight  ASSESSMENT:    1. Essential hypertension   2. Coronary artery disease involving native coronary artery of native heart with angina pectoris ([place])   3. DM (diabetes mellitus) type II uncontrolled, periph vascular disorder ([place])   4. Hyperlipidemia associated with type 2 diabetes mellitus ([place])    PLAN:     1.Since starting the diuretics his shortness of breath has improved - will continue this regimen. No chest pain.   No angina symptoms - continue his DAPT for now. He is planning for a colonoscopy and should be able to hold the Ticagrelor for 5 days.  This is being managed by his primary care doctor.  No adjustments for antidiabetic medications were made today.  Hyperlipidemia - continue with current statin medication.  Blood pressure is acceptable, continue with current antihypertensive regimen.  The patient is in agreement with the above plan. The patient left the office in stable condition.  The patient will follow up in 6 months   Medication Adjustments/Labs  and Tests Ordered: Current medicines are reviewed at length with the patient today.  Concerns regarding medicines are outlined above.  Orders Placed This Encounter  Procedures   Basic metabolic panel   Magnesium   No orders of the defined types were placed in this encounter.   Patient Instructions  Medication Instructions:  Your physician recommends that you continue on your current medications as directed. Please refer to the Current Medication list given to you today.  *If you need a refill on your cardiac medications before your next appointment, please call your pharmacy*   Lab Work: Your physician recommends that you return for lab work: TODAY: BMET, [place]  If you have labs (blood work) drawn today and your tests are completely normal, you will receive your results only by: MyChart Message (if you have MyChart) OR A paper copy in the mail If you have any lab test that is abnormal or we need to change your treatment, we will call you to review the results.   Testing/Procedures: None   Follow-Up: At [facility], you and your health needs are our priority.  As part of our continuing mission to provide you with exceptional heart care, we have created designated Provider Care Teams.  These Care Teams include your primary Cardiologist (physician) and Advanced Practice Providers (APPs -  Physician Assistants and Nurse Practitioners) who all work together to provide you with the care you need, when you need it.  We recommend signing up for the patient portal called "MyChart".  Sign up information is provided on this After Visit Summary.  MyChart is used to connect with patients for Virtual Visits (Telemedicine).  Patients are able to view lab/test results, encounter notes, upcoming appointments,  etc.  Non-urgent messages can be sent to your provider as well.   To learn more about what you can do with MyChart, go to [email or web address].    Your next appointment:   6  month(s)  The format for your next appointment:   In Person  Provider:   Berniece Salines, DO   Other Instructions    Adopting a Healthy Lifestyle.  Know what a healthy weight is for you (roughly BMI <25) and aim to maintain this   Aim for 7+ servings of fruits and vegetables daily   65-80+ fluid ounces of water or unsweet tea for healthy kidneys   Limit to max 1 drink of alcohol per day; avoid smoking/tobacco   Limit animal fats in diet for cholesterol and heart health - choose grass fed whenever available   Avoid highly processed foods, and foods high in saturated/trans fats   Aim for low stress - take time to unwind and care for your mental health   Aim for 150 min of moderate intensity exercise weekly for heart health, and weights twice weekly for bone health   Aim for 7-9 hours of sleep daily   When it comes to diets, agreement about the perfect plan isnt easy to find, even among the experts. Experts at the [place] developed an idea known as the Healthy Eating Plate. Just imagine a plate divided into logical, healthy portions.   The emphasis is on diet quality:   Load up on vegetables and fruits - one-half of your plate: Aim for color and variety, and remember that potatoes dont count.   Go for whole grains - one-quarter of your plate: Whole wheat, barley, wheat berries, quinoa, oats, brown rice, and foods made with them. If you want pasta, go with whole wheat pasta.   Protein power - one-quarter of your plate: Fish, chicken, beans, and nuts are all healthy, versatile protein sources. Limit red meat.   The diet, however, does go beyond the plate, offering a few other suggestions.   Use healthy plant oils, such as olive, canola, soy, corn, sunflower and peanut. Check the labels, and avoid partially hydrogenated oil, which have unhealthy trans fats.   If youre thirsty, drink water. Coffee and tea are good in moderation, but skip sugary drinks  and limit milk and dairy products to one or two daily servings.   The type of carbohydrate in the diet is more important than the amount. Some sources of carbohydrates, such as vegetables, fruits, whole grains, and beans-are healthier than others.   Finally, stay active  Signed, Berniece Salines, DO  06/10/2021 1:01 PM    [facility]

## 2021-06-11 ENCOUNTER — Telehealth: Payer: Self-pay | Admitting: Cardiology

## 2021-06-11 LAB — BASIC METABOLIC PANEL
BUN/Creatinine Ratio: 19 (ref 10–24)
BUN: 18 mg/dL (ref 8–27)
CO2: 25 mmol/L (ref 20–29)
Calcium: 9.7 mg/dL (ref 8.6–10.2)
Chloride: 103 mmol/L (ref 96–106)
Creatinine, Ser: 0.93 mg/dL (ref 0.76–1.27)
Glucose: 119 mg/dL — ABNORMAL HIGH (ref 65–99)
Potassium: 4.7 mmol/L (ref 3.5–5.2)
Sodium: 139 mmol/L (ref 134–144)
eGFR: 91 mL/min/{1.73_m2} (ref >59)

## 2021-06-11 LAB — MAGNESIUM: Magnesium: 1.9 mg/dL (ref 1.6–2.3)

## 2021-06-11 NOTE — Telephone Encounter (Signed)
Spoke with patient, see chart.    

## 2021-06-11 NOTE — Telephone Encounter (Signed)
Follow up:      Patient returning a call back to the nurse.

## 2021-06-13 ENCOUNTER — Telehealth: Payer: Self-pay

## 2021-06-13 NOTE — Telephone Encounter (Signed)
Patient was seen by P.Guenther on 05-01-2021. He was scheduled for an EGD/Colon, this was cancelled , dur to not receiving cardiac clearance.  Cardio follow up was on 06-10-2021. Please see office visit form Kardie Tobb, DO

## 2021-06-14 ENCOUNTER — Encounter: Payer: Self-pay | Admitting: Gastroenterology

## 2021-06-14 NOTE — Telephone Encounter (Signed)
Left message to return call 

## 2021-06-14 NOTE — Telephone Encounter (Signed)
Nurse visit is scheduled for 07-04-2021 and a EGD/Colon

## 2021-06-19 ENCOUNTER — Other Ambulatory Visit: Payer: Self-pay | Admitting: Family Medicine

## 2021-06-19 ENCOUNTER — Other Ambulatory Visit (HOSPITAL_BASED_OUTPATIENT_CLINIC_OR_DEPARTMENT_OTHER): Payer: Self-pay

## 2021-06-19 DIAGNOSIS — G8929 Other chronic pain: Secondary | ICD-10-CM

## 2021-06-19 MED FILL — Pantoprazole Sodium EC Tab 40 MG (Base Equiv): ORAL | 30 days supply | Qty: 30 | Fill #2 | Status: AC

## 2021-06-19 MED FILL — Rosuvastatin Calcium Tab 40 MG: ORAL | 90 days supply | Qty: 90 | Fill #0 | Status: AC

## 2021-06-19 NOTE — Telephone Encounter (Signed)
Requesting: oxycodone 5-325mg  Contract: 03/07/2021 UDS: 03/07/2021 Last Visit: 06/07/2021 Next Visit: None Last Refill: 05/21/2021 #120 and 0RF  Please Advise

## 2021-06-20 ENCOUNTER — Other Ambulatory Visit (HOSPITAL_BASED_OUTPATIENT_CLINIC_OR_DEPARTMENT_OTHER): Payer: Self-pay

## 2021-06-20 MED ORDER — OXYCODONE-ACETAMINOPHEN 5-325 MG PO TABS
ORAL_TABLET | ORAL | 0 refills | Status: DC
  Filled 2021-06-20: qty 120, 20d supply, fill #0

## 2021-06-23 NOTE — Procedures (Signed)
Patient Name: Ronald Petersen, Ronald Petersen Date: 06/05/2021 Gender: Male D.O.B: 1956-03-01 Age (years): 32 Referring Provider: Godfrey Pick Tobb DO Height (inches): 72 Interpreting Physician: Shelva Majestic MD, ABSM Weight (lbs): 188 RPSGT: Carolin Coy BMI: 25 MRN: [id number] Neck Size: 16.00  CLINICAL INFORMATION The patient is referred for a BiPAP titration to treat sleep apnea.  Epworth Sleepiness Score : 12  Date of NPSG: 03/02/2021: AHI 8.2/h, RDI 8.8/h; supine AHI 28/h; minimal REM sleep.  SLEEP STUDY TECHNIQUE As per the AASM Manual for the Scoring of Sleep and Associated Events v2.3 (April 2016) with a hypopnea requiring 4% desaturations.  The channels recorded and monitored were frontal, central and occipital EEG, electrooculogram (EOG), submentalis EMG (chin), nasal and oral airflow, thoracic and abdominal wall motion, anterior tibialis EMG, snore microphone, electrocardiogram, and pulse oximetry. Bilevel positive airway pressure (BPAP) was initiated at the beginning of the study and titrated to treat sleep-disordered breathing.  MEDICATIONS aspirin EC 81 MG table fenofibrate 160 MG tablet furosemide (LASIX) 40 MG table gabapentin (NEURONTIN) 800 MG tablet glimepiride ([name]) 2 MG tablet glucose blood test strip lisinopril (ZESTRIL) 5 MG tablet metFORMIN (GLUCOPHAGE-XR) 500 MG 24 hr tablet nitroGLYCERIN (NITROSTAT) 0.4 MG SL tablet omeprazole (PRILOSEC) 40 MG capsule ondansetron (ZOFRAN) 4 MG tablet oxyCODONE-acetaminophen (PERCOCET/ROXICET) 5-325 MG tablet pantoprazole (PROTONIX) 40 MG tablet potassium chloride SA (KLOR-CON) 20 MEQ tablet ranolazine (RANEXA) 1000 MG SR tablet rosuvastatin (CRESTOR) 40 MG tablet sennosides-docusate sodium (SENOKOT-S) 8.6-50 MG tablet sertraline (ZOLOFT) 100 MG tablet Suvorexant 10 MG TABS ticagrelor (BRILINTA) 90 MG TABS tablet tiZANidine (ZANAFLEX) 4 MG tablet traZODone (DESYREL) 50 MG tablet Medications self-administered by  patient taken the night of the study : FENOFIBRATE, GABAPENTIN, METFORMIN, ranolazine, CRESTOR, SERTRALINE, brilinta, TIZANIDINE, TRAZODONE  RESPIRATORY PARAMETERS Optimal IPAP Pressure (cm): 26 AHI at Optimal Pressure (/hr) 1.1 Optimal EPAP Pressure (cm): 22   Overall Minimal O2 (%): 88.0 Minimal O2 at Optimal Pressure (%): 96.0  SLEEP ARCHITECTURE Start Time: 11:03:47 PM Stop Time: 5:21:15 AM Total Time (min): 377.5 Total Sleep Time (min): 335.5 Sleep Latency (min): 7.1 Sleep Efficiency (%): 88.9% REM Latency (min): N/A WASO (min): 34.9 Stage N1 (%): 8.2% Stage N2 (%): 91.4% Stage N3 (%): 0.4% Stage R (%): 0 Supine (%): 18.18 Arousal Index (/hr): 20.2   CARDIAC DATA The 2 lead EKG demonstrated sinus rhythm. The mean heart rate was 46.7 beats per minute. Other EKG findings include: None.  LEG MOVEMENT DATA The total Periodic Limb Movements of Sleep (PLMS) were 0. The PLMS index was 0.0. A PLMS index of <15 is considered normal in adults.  IMPRESSIONS - CPAP was initiated at 6 cm and wad titrated to 16 cm but due to continued events was transitioned to BiPAP and titrated to 26/22 cm of water.  Pressure requiremnt was significantly higher with supine sleep. - Central sleep apnea was not noted during this titration (CAI = 2.7/h). - Mild oxygen desaturations to a nadir of 88.0%. - The patient snored with moderate snoring volume. - No cardiac abnormalities were observed during this study. - Clinically significant periodic limb movements were not noted during this study. Arousals associated with PLMs were rare.  DIAGNOSIS - Obstructive Sleep Apnea (G47.33)  RECOMMENDATIONS - Recommend an initial trial of BiPAP Auto therapy with EPAP min of 10, pressure support of 5, and IPAP max of 25 cm of water with  heated humidification. A large size Resmed Full Face Mask Mirage Quattro mask was used for the titration. - Effort should be made to  optimize nasal and oropharyngeal patency.  - The  patient should be counseled to avoid supine sleep.  - Avoid alcohol, sedatives and other CNS depressants that may worsen sleep apnea and disrupt normal sleep architecture. - Sleep hygiene should be reviewed to assess factors that may improve sleep quality. - Weight management and regular exercise should be initiated or continued. - Recommend a download in 30 days and sleep clinic evaluation after 4 weeks of therapy.  [Electronically signed] 06/23/2021 01:52 PM  Shelva Majestic MD, [facility], [place], American Board of Sleep Medicine   NPI: [id number] [place] PH: [phone]   FX: [phone] [place]

## 2021-07-04 ENCOUNTER — Ambulatory Visit (AMBULATORY_SURGERY_CENTER): Payer: Self-pay | Admitting: *Deleted

## 2021-07-04 ENCOUNTER — Other Ambulatory Visit (HOSPITAL_BASED_OUTPATIENT_CLINIC_OR_DEPARTMENT_OTHER): Payer: Self-pay

## 2021-07-04 ENCOUNTER — Other Ambulatory Visit: Payer: Self-pay

## 2021-07-04 VITALS — Ht 72.0 in | Wt 193.0 lb

## 2021-07-04 DIAGNOSIS — R634 Abnormal weight loss: Secondary | ICD-10-CM

## 2021-07-04 DIAGNOSIS — K625 Hemorrhage of anus and rectum: Secondary | ICD-10-CM

## 2021-07-04 DIAGNOSIS — R110 Nausea: Secondary | ICD-10-CM

## 2021-07-04 DIAGNOSIS — R1030 Lower abdominal pain, unspecified: Secondary | ICD-10-CM

## 2021-07-04 MED ORDER — PLENVU 140 G PO SOLR
1.0000 | Freq: Once | ORAL | 0 refills | Status: AC
Start: 2021-07-04 — End: 2021-07-11
  Filled 2021-07-04: qty 1, 1d supply, fill #0

## 2021-07-04 NOTE — Progress Notes (Signed)
Patient's pre-visit was done today over the phone with the patient due to COVID-19 pandemic. Name,DOB and address verified. Insurance verified. Patient denies any allergies to Eggs and Soy. Patient denies any problems with anesthesia/sedation. Patient denies taking diet pills. Pt is on a blood thinner- pt aware to stop 5 days before ECL. Packet of Prep instructions mailed to patient including a copy of a consent form, coupon-pt is aware. Patient understands to call us back with any questions or concerns. Patient is aware of our care-partner policy and [id number] safety protocol.   EMMI education assigned to the patient for the procedure, sent to [facility]. Patient denies any medical hx changes since last GI OV.

## 2021-07-05 ENCOUNTER — Other Ambulatory Visit (HOSPITAL_BASED_OUTPATIENT_CLINIC_OR_DEPARTMENT_OTHER): Payer: Self-pay

## 2021-07-08 ENCOUNTER — Telehealth: Payer: Self-pay | Admitting: *Deleted

## 2021-07-08 NOTE — Telephone Encounter (Signed)
Left message BIPAP study has been completed. Order for BIPAP machine has been sent to [facility].

## 2021-07-08 NOTE — Telephone Encounter (Signed)
-----   Message from Troy Sine, MD sent at 06/23/2021  2:10 PM EDT ----- Mariann Laster please notify pt and set up BiPAP

## 2021-07-10 ENCOUNTER — Other Ambulatory Visit: Payer: Self-pay | Admitting: Family Medicine

## 2021-07-10 ENCOUNTER — Other Ambulatory Visit (HOSPITAL_BASED_OUTPATIENT_CLINIC_OR_DEPARTMENT_OTHER): Payer: Self-pay

## 2021-07-10 DIAGNOSIS — I10 Essential (primary) hypertension: Secondary | ICD-10-CM

## 2021-07-10 DIAGNOSIS — R110 Nausea: Secondary | ICD-10-CM

## 2021-07-10 DIAGNOSIS — R1013 Epigastric pain: Secondary | ICD-10-CM

## 2021-07-10 MED ORDER — LISINOPRIL 5 MG PO TABS
ORAL_TABLET | Freq: Every day | ORAL | 1 refills | Status: DC
  Filled 2021-07-10: qty 90, 90d supply, fill #0

## 2021-07-10 MED ORDER — PANTOPRAZOLE SODIUM 40 MG PO TBEC
DELAYED_RELEASE_TABLET | Freq: Every day | ORAL | 3 refills | Status: DC
  Filled 2021-07-10 – 2021-07-12 (×2): qty 30, 30d supply, fill #0
  Filled 2021-08-12: qty 30, 30d supply, fill #1
  Filled 2021-09-10: qty 30, 30d supply, fill #2

## 2021-07-10 MED FILL — Glimepiride Tab 2 MG: ORAL | 90 days supply | Qty: 90 | Fill #1 | Status: AC

## 2021-07-12 ENCOUNTER — Other Ambulatory Visit (HOSPITAL_BASED_OUTPATIENT_CLINIC_OR_DEPARTMENT_OTHER): Payer: Self-pay

## 2021-07-16 ENCOUNTER — Other Ambulatory Visit (HOSPITAL_BASED_OUTPATIENT_CLINIC_OR_DEPARTMENT_OTHER): Payer: Self-pay

## 2021-07-18 ENCOUNTER — Ambulatory Visit (AMBULATORY_SURGERY_CENTER): Payer: Medicare HMO | Admitting: Gastroenterology

## 2021-07-18 ENCOUNTER — Encounter: Payer: Self-pay | Admitting: Gastroenterology

## 2021-07-18 ENCOUNTER — Other Ambulatory Visit: Payer: Self-pay

## 2021-07-18 VITALS — BP 122/61 | HR 55 | Temp 96.4°F | Resp 13 | Ht 72.0 in | Wt 193.0 lb

## 2021-07-18 DIAGNOSIS — R110 Nausea: Secondary | ICD-10-CM

## 2021-07-18 DIAGNOSIS — K219 Gastro-esophageal reflux disease without esophagitis: Secondary | ICD-10-CM | POA: Diagnosis not present

## 2021-07-18 DIAGNOSIS — K5730 Diverticulosis of large intestine without perforation or abscess without bleeding: Secondary | ICD-10-CM

## 2021-07-18 DIAGNOSIS — K319 Disease of stomach and duodenum, unspecified: Secondary | ICD-10-CM | POA: Diagnosis not present

## 2021-07-18 DIAGNOSIS — R1084 Generalized abdominal pain: Secondary | ICD-10-CM

## 2021-07-18 DIAGNOSIS — K648 Other hemorrhoids: Secondary | ICD-10-CM | POA: Diagnosis not present

## 2021-07-18 DIAGNOSIS — E119 Type 2 diabetes mellitus without complications: Secondary | ICD-10-CM | POA: Diagnosis not present

## 2021-07-18 DIAGNOSIS — I10 Essential (primary) hypertension: Secondary | ICD-10-CM | POA: Diagnosis not present

## 2021-07-18 DIAGNOSIS — R634 Abnormal weight loss: Secondary | ICD-10-CM

## 2021-07-18 DIAGNOSIS — R1030 Lower abdominal pain, unspecified: Secondary | ICD-10-CM | POA: Diagnosis not present

## 2021-07-18 DIAGNOSIS — K2970 Gastritis, unspecified, without bleeding: Secondary | ICD-10-CM | POA: Diagnosis not present

## 2021-07-18 DIAGNOSIS — K5900 Constipation, unspecified: Secondary | ICD-10-CM | POA: Diagnosis not present

## 2021-07-18 DIAGNOSIS — I2510 Atherosclerotic heart disease of native coronary artery without angina pectoris: Secondary | ICD-10-CM | POA: Diagnosis not present

## 2021-07-18 DIAGNOSIS — D125 Benign neoplasm of sigmoid colon: Secondary | ICD-10-CM | POA: Diagnosis not present

## 2021-07-18 DIAGNOSIS — K625 Hemorrhage of anus and rectum: Secondary | ICD-10-CM | POA: Diagnosis not present

## 2021-07-18 DIAGNOSIS — K640 First degree hemorrhoids: Secondary | ICD-10-CM

## 2021-07-18 MED ORDER — SODIUM CHLORIDE 0.9 % IV SOLN
500.0000 mL | Freq: Once | INTRAVENOUS | Status: DC
Start: 2021-07-18 — End: 2021-07-18

## 2021-07-18 NOTE — Progress Notes (Signed)
Called to room to assist during endoscopic procedure.  Patient ID and intended procedure confirmed with present staff. Received instructions for my participation in the procedure from the performing physician.  

## 2021-07-18 NOTE — Progress Notes (Signed)
Report to PACU, RN, vss, BBS= Clear.  

## 2021-07-18 NOTE — Op Note (Signed)
[facility] Patient Name: Ronald Petersen Procedure Date: 07/18/2021 1:52 PM MRN: [id number] Endoscopist: Gerrit Heck , MD Age: 65 Referring MD:  Date of Birth: 1956-08-30 Gender: Male Account #: [id number] Procedure:                Colonoscopy Indications:              Generalized abdominal pain, Heme positive stool,                            Constipation Medicines:                Monitored Anesthesia Care Procedure:                Pre-Anesthesia Assessment:                           - Prior to the procedure, a History and Physical                            was performed, and patient medications and                            allergies were reviewed. The patient's tolerance of                            previous anesthesia was also reviewed. The risks                            and benefits of the procedure and the sedation                            options and risks were discussed with the patient.                            All questions were answered, and informed consent                            was obtained. Prior Anticoagulants: The patient has                            taken Brilinta, last dose was 5 days prior to                            procedure. Currently taking Aspirin 81 mg/day. ASA                            Grade Assessment: II - A patient with mild systemic                            disease. After reviewing the risks and benefits,                            the patient was deemed in satisfactory condition to  undergo the procedure.                           After obtaining informed consent, the colonoscope                            was passed under direct vision. Throughout the                            procedure, the patient's blood pressure, pulse, and                            oxygen saturations were monitored continuously. The                            Colonoscope was introduced through the anus and                             advanced to the the terminal ileum. The colonoscopy                            was performed without difficulty. The patient                            tolerated the procedure well. The quality of the                            bowel preparation was good. The terminal ileum,                            ileocecal valve, appendiceal orifice, and rectum                            were photographed. Scope In: 2:05:44 PM Scope Out: 2:26:17 PM Scope Withdrawal Time: 0 hours 16 minutes 46 seconds  Total Procedure Duration: 0 hours 20 minutes 33 seconds  Findings:                 The perianal and digital rectal examinations were                            normal.                           Two sessile polyps were found in the sigmoid colon.                            The polyps were 2 to 4 mm in size. These polyps                            were removed with a cold snare. Resection and                            retrieval were complete. Estimated blood loss was  minimal.                           Small, non-bleeding internal hemorrhoids and                            hypertrophied anal papillae were found during                            retroflexion.                           The exam was otherwise normal throughout the                            remainder of the colon. No areas of mucosal                            erythema, edema, ulceration, or erosions noted.                           The terminal ileum appeared normal. Complications:            No immediate complications. Estimated Blood Loss:     Estimated blood loss was minimal. Impression:               - Two 2 to 4 mm polyps in the sigmoid colon,                            removed with a cold snare. Resected and retrieved.                           - Non-bleeding internal hemorrhoids.                           - The examined portion of the ileum was normal. Recommendation:           - Patient has  a contact number available for                            emergencies. The signs and symptoms of potential                            delayed complications were discussed with the                            patient. Return to normal activities tomorrow.                            Written discharge instructions were provided to the                            patient.                           - Resume previous diet.                           -  Continue present medications.                           - Await pathology results.                           - Repeat colonoscopy for surveillance based on                            pathology results.                           - Follow-up with Tye Savoy or Dr. Luvenia Starch                            in the GI office at appointment to be scheduled.                           - Ok to restart [employer]. Gerrit Heck, MD 07/18/2021 2:38:51 PM

## 2021-07-18 NOTE — Op Note (Signed)
[facility] Patient Name: Ronald Petersen Procedure Date: 07/18/2021 1:53 PM MRN: [id number] Endoscopist: Gerrit Heck , MD Age: 65 Referring MD:  Date of Birth: 04/19/1956 Gender: Male Account #: [id number] Procedure:                Upper GI endoscopy Indications:              Generalized abdominal pain, Suspected esophageal                            reflux, Unexplained chest pain, Nausea, Weight loss Medicines:                Monitored Anesthesia Care Procedure:                Pre-Anesthesia Assessment:                           - Prior to the procedure, a History and Physical                            was performed, and patient medications and                            allergies were reviewed. The patient's tolerance of                            previous anesthesia was also reviewed. The risks                            and benefits of the procedure and the sedation                            options and risks were discussed with the patient.                            All questions were answered, and informed consent                            was obtained. Prior Anticoagulants: The patient has                            taken Brilinta, last dose was 5 days prior to                            procedure. Currently taking Aspirin 81 mg/day. ASA                            Grade Assessment: II - A patient with mild systemic                            disease. After reviewing the risks and benefits,                            the patient was deemed in satisfactory condition to  undergo the procedure.                           After obtaining informed consent, the endoscope was                            passed under direct vision. Throughout the                            procedure, the patient's blood pressure, pulse, and                            oxygen saturations were monitored continuously. The                            Endoscope was  introduced through the mouth, and                            advanced to the second part of duodenum. The upper                            GI endoscopy was accomplished without difficulty.                            The patient tolerated the procedure well. Scope In: Scope Out: Findings:                 The examined esophagus was normal.                           Mild inflammation characterized by congestion                            (edema) and erythema was found in the gastric body,                            at the incisura and in the gastric antrum. Biopsies                            were taken with a cold forceps for Helicobacter                            pylori testing. Estimated blood loss was minimal.                           The examined duodenum was normal. Biopsies were                            taken with a cold forceps for histology. Estimated                            blood loss was minimal. Complications:            No immediate complications. Estimated Blood Loss:     Estimated blood loss was minimal. Impression:               -  Normal esophagus.                           - Gastritis. Biopsied.                           - Normal examined duodenum. Biopsied. Recommendation:           - Patient has a contact number available for                            emergencies. The signs and symptoms of potential                            delayed complications were discussed with the                            patient. Return to normal activities tomorrow.                            Written discharge instructions were provided to the                            patient.                           - Resume previous diet.                           - Continue present medications.                           - Await pathology results. Gerrit Heck, MD 07/18/2021 2:32:41 PM

## 2021-07-18 NOTE — Progress Notes (Signed)
Reviewed discharge instructions with pt and family and instruct pt to resume Brilinta tomorrow.

## 2021-07-18 NOTE — Patient Instructions (Signed)
Handouts given for gastritis and polyps.  YOU HAD AN ENDOSCOPIC PROCEDURE TODAY AT [facility]:   Refer to the procedure report that was given to you for any specific questions about what was found during the examination.  If the procedure report does not answer your questions, please call your gastroenterologist to clarify.  If you requested that your care partner not be given the details of your procedure findings, then the procedure report has been included in a sealed envelope for you to review at your convenience later.  YOU SHOULD EXPECT: Some feelings of bloating in the abdomen. Passage of more gas than usual.  Walking can help get rid of the air that was put into your GI tract during the procedure and reduce the bloating. If you had a lower endoscopy (such as a colonoscopy or flexible sigmoidoscopy) you may notice spotting of blood in your stool or on the toilet paper. If you underwent a bowel prep for your procedure, you may not have a normal bowel movement for a few days.  Please Note:  You might notice some irritation and congestion in your nose or some drainage.  This is from the oxygen used during your procedure.  There is no need for concern and it should clear up in a day or so.  SYMPTOMS TO REPORT IMMEDIATELY: Following lower endoscopy (colonoscopy or flexible sigmoidoscopy):  Excessive amounts of blood in the stool  Significant tenderness or worsening of abdominal pains  Swelling of the abdomen that is new, acute  Fever of 100F or higher  Following upper endoscopy (EGD)  Vomiting of blood or coffee ground material  New chest pain or pain under the shoulder blades  Painful or persistently difficult swallowing  New shortness of breath  Black, tarry-looking stools  For urgent or emergent issues, a gastroenterologist can be reached at any hour by calling [phone]. Do not use MyChart messaging for urgent concerns.   DIET:  We do recommend a small meal at  first, but then you may proceed to your regular diet.  Drink plenty of fluids but you should avoid alcoholic beverages for 24 hours.  ACTIVITY:  You should plan to take it easy for the rest of today and you should NOT DRIVE or use heavy machinery until tomorrow (because of the sedation medicines used during the test).    FOLLOW UP: Our staff will call the number listed on your records 48-72 hours following your procedure to check on you and address any questions or concerns that you may have regarding the information given to you following your procedure. If we do not reach you, we will leave a message.  We will attempt to reach you two times.  During this call, we will ask if you have developed any symptoms of COVID 19. If you develop any symptoms (ie: fever, flu-like symptoms, shortness of breath, cough etc.) before then, please call [phone].  If you test positive for Covid 19 in the 2 weeks post procedure, please call and report this information to [place].    If any biopsies were taken you will be contacted by phone or by letter within the next 1-3 weeks.  Please call us at [phone] if you have not heard about the biopsies in 3 weeks.    SIGNATURES/CONFIDENTIALITY: You and/or your care partner have signed paperwork which will be entered into your electronic medical record.  These signatures attest to the fact that that the information above on your After Visit  Summary has been reviewed and is understood.  Full responsibility of the confidentiality of this discharge information lies with you and/or your care-partner.  

## 2021-07-18 NOTE — Progress Notes (Signed)
VS  DT ? ?Pt's states no medical or surgical changes since previsit or office visit. ? ?

## 2021-07-19 ENCOUNTER — Other Ambulatory Visit: Payer: Self-pay

## 2021-07-19 ENCOUNTER — Other Ambulatory Visit (HOSPITAL_BASED_OUTPATIENT_CLINIC_OR_DEPARTMENT_OTHER): Payer: Self-pay

## 2021-07-19 DIAGNOSIS — I10 Essential (primary) hypertension: Secondary | ICD-10-CM

## 2021-07-19 MED ORDER — LISINOPRIL 5 MG PO TABS
ORAL_TABLET | Freq: Every day | ORAL | 0 refills | Status: DC
  Filled 2021-07-19: qty 90, 90d supply, fill #0
  Filled 2021-10-21: qty 90, 90d supply, fill #1

## 2021-07-22 ENCOUNTER — Telehealth: Payer: Self-pay

## 2021-07-22 ENCOUNTER — Other Ambulatory Visit: Payer: Self-pay | Admitting: Family Medicine

## 2021-07-22 ENCOUNTER — Other Ambulatory Visit (HOSPITAL_BASED_OUTPATIENT_CLINIC_OR_DEPARTMENT_OTHER): Payer: Self-pay

## 2021-07-22 DIAGNOSIS — G8929 Other chronic pain: Secondary | ICD-10-CM

## 2021-07-22 NOTE — Telephone Encounter (Signed)
First attempt follow up call to pt, no answer. 

## 2021-07-22 NOTE — Telephone Encounter (Signed)
  Follow up Call-  Call back number 07/18/2021  Post procedure Call Back phone  # [phone]  Permission to leave phone message Yes  Some recent data might be hidden     Patient questions:  Do you have a fever, pain , or abdominal swelling? No. Pain Score  0 *  Have you tolerated food without any problems? Yes.    Have you been able to return to your normal activities? Yes.    Do you have any questions about your discharge instructions: Diet   No. Medications  No. Follow up visit  No.  Do you have questions or concerns about your Care? No.  Actions: * If pain score is 4 or above: No action needed, pain <4.  Have you developed a fever since your procedure? no  2.   Have you had an respiratory symptoms (SOB or cough) since your procedure? no  3.   Have you tested positive for COVID 19 since your procedure no  4.   Have you had any family members/close contacts diagnosed with the COVID 19 since your procedure?  no   If yes to any of these questions please route to Joylene John, RN and Joella Prince, RN

## 2021-07-23 ENCOUNTER — Other Ambulatory Visit (HOSPITAL_BASED_OUTPATIENT_CLINIC_OR_DEPARTMENT_OTHER): Payer: Self-pay

## 2021-07-23 MED ORDER — OXYCODONE-ACETAMINOPHEN 5-325 MG PO TABS
ORAL_TABLET | ORAL | 0 refills | Status: DC
Start: 2021-07-23 — End: 2021-08-26
  Filled 2021-07-23: qty 120, 20d supply, fill #0

## 2021-07-23 NOTE — Telephone Encounter (Signed)
Requesting: Percocet  Contract: 03/07/21 UDS: 03/07/21 Last OV: 06/07/21 Next OV: N/A Last Refill: 06/20/21, #[id number] RF Database:   Please advise

## 2021-07-25 ENCOUNTER — Encounter: Payer: Self-pay | Admitting: Gastroenterology

## 2021-07-25 ENCOUNTER — Other Ambulatory Visit (HOSPITAL_BASED_OUTPATIENT_CLINIC_OR_DEPARTMENT_OTHER): Payer: Self-pay

## 2021-07-30 ENCOUNTER — Other Ambulatory Visit: Payer: Self-pay | Admitting: Family Medicine

## 2021-07-30 ENCOUNTER — Encounter: Payer: Self-pay | Admitting: Internal Medicine

## 2021-07-30 ENCOUNTER — Ambulatory Visit (INDEPENDENT_AMBULATORY_CARE_PROVIDER_SITE_OTHER): Payer: Medicare HMO | Admitting: Internal Medicine

## 2021-07-30 ENCOUNTER — Other Ambulatory Visit (HOSPITAL_BASED_OUTPATIENT_CLINIC_OR_DEPARTMENT_OTHER): Payer: Self-pay

## 2021-07-30 ENCOUNTER — Other Ambulatory Visit: Payer: Self-pay

## 2021-07-30 VITALS — BP 136/80 | HR 52 | Ht 72.0 in | Wt 194.3 lb

## 2021-07-30 DIAGNOSIS — G8929 Other chronic pain: Secondary | ICD-10-CM

## 2021-07-30 DIAGNOSIS — E785 Hyperlipidemia, unspecified: Secondary | ICD-10-CM | POA: Diagnosis not present

## 2021-07-30 DIAGNOSIS — E11649 Type 2 diabetes mellitus with hypoglycemia without coma: Secondary | ICD-10-CM | POA: Diagnosis not present

## 2021-07-30 DIAGNOSIS — E1169 Type 2 diabetes mellitus with other specified complication: Secondary | ICD-10-CM

## 2021-07-30 DIAGNOSIS — M25511 Pain in right shoulder: Secondary | ICD-10-CM

## 2021-07-30 DIAGNOSIS — E1142 Type 2 diabetes mellitus with diabetic polyneuropathy: Secondary | ICD-10-CM

## 2021-07-30 DIAGNOSIS — E1165 Type 2 diabetes mellitus with hyperglycemia: Secondary | ICD-10-CM

## 2021-07-30 LAB — POCT GLYCOSYLATED HEMOGLOBIN (HGB A1C): Hemoglobin A1C: 5.4 % (ref 4.0–5.6)

## 2021-07-30 MED ORDER — METFORMIN HCL ER 500 MG PO TB24
500.0000 mg | ORAL_TABLET | Freq: Every day | ORAL | 3 refills | Status: DC
Start: 2021-07-30 — End: 2021-12-28
  Filled 2021-07-30: qty 90, 90d supply, fill #0
  Filled 2021-11-04: qty 90, 90d supply, fill #1

## 2021-07-30 MED ORDER — FREESTYLE LIBRE 2 SENSOR MISC
1.0000 | 6 refills | Status: DC
  Filled 2021-07-30 – 2021-08-23 (×2): qty 2, 28d supply, fill #0
  Filled 2022-02-05: qty 2, 30d supply, fill #0

## 2021-07-30 MED ORDER — FREESTYLE LIBRE 2 READER DEVI
1.0000 | 0 refills | Status: DC
  Filled 2021-07-30 – 2021-11-06 (×2): qty 1, 30d supply, fill #0

## 2021-07-30 MED FILL — Sertraline HCl Tab 100 MG: ORAL | 90 days supply | Qty: 135 | Fill #0 | Status: AC

## 2021-07-30 MED FILL — Glucose Blood Test Strip: 90 days supply | Qty: 100 | Fill #1 | Status: AC

## 2021-07-30 NOTE — Progress Notes (Signed)
Name: Ronald Petersen  Age/ Sex: 65 y.o., male   MRN/ DOB: [id number], 1956/07/09     PCP: [name], Alferd Apa, DO   Reason for Endocrinology Evaluation: Type 2 Diabetes Mellitus  Initial Endocrine Consultative Visit: 06/06/2020    PATIENT IDENTIFIER: [name] is a 65 y.o. male with a past medical history of T2DM, OSA and Dyslipidemia . The patient has followed with Endocrinology clinic since 06/06/2020 for consultative assistance with management of his diabetes.  DIABETIC HISTORY:  [name] was diagnosed with DM in 2008, has been on victoza, [place] and Januvia. His hemoglobin A1c has ranged from 6.1% in 2016, peaking at 9.7% in 2020.  No hx of pancreatitis     On his initial visit to our clinic, his A1c was 9.3% , he was on Metformin and Glimepiride. Which we increased.   SUBJECTIVE:   During the last visit (01/22/2021): A1c 6.2 % . We continued metformin and decreased Glimepiride   Today (07/30/2021): [name] is here for a follow up on diabetes.  He checks his blood sugars 1 times daily, he forgot his meter today. The patient has had hypoglycemic episodes since the last clinic visit.  He is symptomatic with these episodes    S/P endoscopic procedure for rectal bleed 07/18/2021, had issues with constipation , stool softeners have not  been helping  He is on chronic oxycodon    He stopped metformin a month ago due to hypoglycemia    HOME DIABETES REGIMEN:  Glimepiride 2 mg , 1 tablet before breakfast  Metformin 500 mg, TWO tablet with Breakfast and 1 with supper - not taking       Statin: Yes ACE-I/ARB: Yes    METER DOWNLOAD SUMMARY: Did not bring    DIABETIC COMPLICATIONS: Microvascular complications:  Neuropathy Denies: CKD, Retinopathy Last Eye Exam: Completed   Macrovascular complications:  Denies: CAD, CVA, PVD   HISTORY:  Past Medical History:  Past Medical History:  Diagnosis Date   Anxiety    Arthritis    Cancer ([place])     Complication of anesthesia    aspirated with back surgery at age 47   Depression    Diabetes mellitus    GERD (gastroesophageal reflux disease)    Hyperlipidemia    Hypertension    Neuromuscular disorder ([place])    NEUROPATHY   Right rotator cuff tear 11/23/2018   Sleep apnea    no CPAP   Past Surgical History:  Past Surgical History:  Procedure Laterality Date   APPENDECTOMY     ARTHOSCOPIC ROTAOR CUFF REPAIR Right 11/23/2018   Procedure: ARTHROSCOPIC ROTATOR CUFF REPAIR;  Surgeon: [name], MD;  Location: [facility];  Service: Orthopedics;  Laterality: Right;   CHOLECYSTECTOMY     CORONARY STENT INTERVENTION N/A 11/19/2020   Procedure: CORONARY STENT INTERVENTION;  Surgeon: Nelva Bush, MD;  Location: [facility];  Service: Cardiovascular;  Laterality: N/A;   INTRAVASCULAR ULTRASOUND/IVUS N/A 11/19/2020   Procedure: Intravascular Ultrasound/IVUS;  Surgeon: Nelva Bush, MD;  Location: [facility];  Service: Cardiovascular;  Laterality: N/A;   LEFT HEART CATH AND CORONARY ANGIOGRAPHY N/A 11/19/2020   Procedure: LEFT HEART CATH AND CORONARY ANGIOGRAPHY;  Surgeon: Nelva Bush, MD;  Location: [facility];  Service: Cardiovascular;  Laterality: N/A;   LEFT HEART CATH AND CORONARY ANGIOGRAPHY N/A 12/24/2020   Procedure: LEFT HEART CATH AND CORONARY ANGIOGRAPHY;  Surgeon: Martinique, Peter M, MD;  Location: [facility];  Service: Cardiovascular;  Laterality: N/A;   LUMBAR LAMINECTOMY     SHOULDER ARTHROSCOPY WITH ROTATOR CUFF REPAIR AND SUBACROMIAL DECOMPRESSION Right 11/23/2018   Procedure: SHOULDER ARTHROSCOPY WITH ROTATOR CUFF REPAIR AND SUBACROMIAL DECOMPRESSION;  Surgeon: [name], MD;  Location: [facility];  Service: Orthopedics;  Laterality: Right;   Social History:  reports that he quit smoking about 35 years ago. His smoking use included cigarettes. He has never used smokeless tobacco. He reports current  alcohol use of about 4.0 - 5.0 standard drinks of alcohol per week. He reports that he does not use drugs. Family History:  Family History  Problem Relation Age of Onset   COPD Mother    Stroke Father    Ovarian cancer Sister    Stomach cancer Sister    Heart disease Sister        MI   Heart disease Sister        MI   Stomach cancer Maternal Grandmother    Alcohol abuse Other    Depression Other    Arthritis Other    Hypertension Other    Coronary artery disease Other    Ovarian cancer Other        neice   Ovarian cancer Other        neice   Colon polyps Neg Hx    Colon cancer Neg Hx      HOME MEDICATIONS: Allergies as of 07/30/2021       Reactions   Niacin Anaphylaxis        Medication List        Accurate as of July 30, 2021 11:17 AM. If you have any questions, ask your nurse or doctor.          aspirin EC 81 MG tablet Take 1 tablet (81 mg total) by mouth daily. Swallow whole.   Brilinta 90 MG Tabs tablet Generic drug: ticagrelor TAKE 1 TABLET (90 MG TOTAL) BY MOUTH TWO TIMES DAILY.   fenofibrate 160 MG tablet TAKE 1 TABLET (160 MG TOTAL) BY MOUTH DAILY.   furosemide 40 MG tablet Commonly known as: LASIX TAKE 1 TABLET (40 MG TOTAL) BY MOUTH EVERY OTHER DAY.   gabapentin 800 MG tablet Commonly known as: Neurontin Take 1 tablet (800 mg total) by mouth 3 (three) times daily.   glimepiride 2 MG tablet Commonly known as: [name] TAKE 1 TABLET (2 MG TOTAL) BY MOUTH DAILY BEFORE BREAKFAST.   lisinopril 5 MG tablet Commonly known as: ZESTRIL TAKE 1 TABLET (5 MG TOTAL) BY MOUTH DAILY.   metFORMIN 500 MG 24 hr tablet Commonly known as: GLUCOPHAGE-XR TAKE 2 TABLETS (1,000 MG TOTAL) BY MOUTH DAILY WITH BREAKFAST AND 1 TABLET (500 MG TOTAL) DAILY WITH SUPPER.   nitroGLYCERIN 0.4 MG SL tablet Commonly known as: NITROSTAT PLACE 1 TABLET (0.4 MG TOTAL) UNDER THE TONGUE EVERY 5 (FIVE) MINUTES AS NEEDED FOR CHEST PAIN.   omeprazole 40 MG capsule Commonly  known as: PRILOSEC Take 1 capsule by mouth twice daily   ondansetron 4 MG tablet Commonly known as: ZOFRAN TAKE 1 TABLET BY MOUTH EVERY 8 HOURS AS NEEDED   OneTouch Verio test strip Generic drug: glucose blood USE AS INSTRUCTED TO CHECK BLOOD SUGAR ONCE A DAY   oxyCODONE-acetaminophen 5-325 MG tablet Commonly known as: PERCOCET/ROXICET TAKE 1 TABLET BY MOUTH EVERY 4 (FOUR) HOURS AS NEEDED FOR SEVERE PAIN.   pantoprazole 40 MG tablet Commonly known as: PROTONIX TAKE 1 TABLET (40 MG TOTAL) BY MOUTH DAILY.  potassium chloride SA 20 MEQ tablet Commonly known as: KLOR-CON TAKE 1 TABLET (20 MEQ TOTAL) BY MOUTH EVERY OTHER DAY.   ranolazine 1000 MG SR tablet Commonly known as: RANEXA TAKE 1 TABLET (1,000 MG TOTAL) BY MOUTH 2 (TWO) TIMES DAILY.   rosuvastatin 40 MG tablet Commonly known as: CRESTOR TAKE 1 TABLET BY MOUTH ONCE DAILY   sennosides-docusate sodium 8.6-50 MG tablet Commonly known as: SENOKOT-S Take 2 tablets by mouth daily.   sertraline 100 MG tablet Commonly known as: ZOLOFT TAKE ONE AND ONE-HALF TABLET BY MOUTH ONCE DAILY   tiZANidine 4 MG tablet Commonly known as: ZANAFLEX TAKE 1 TABLET (4 MG TOTAL) BY MOUTH EVERY 6 (SIX) HOURS AS NEEDED FOR MUSCLE SPASMS.   traZODone 50 MG tablet Commonly known as: DESYREL TAKE 1/2-1 TABLET (25-50 MG TOTAL) BY MOUTH AT BEDTIME AS NEEDED FOR SLEEP.         OBJECTIVE:   Vital Signs: BP 136/80 (BP Location: Left Arm, Patient Position: Sitting, Cuff Size: Normal)   Pulse (!) 52   Ht 6' (1.829 m)   Wt 194 lb 4.8 oz (88.1 kg)   SpO2 97%   BMI 26.35 kg/m   Wt Readings from Last 3 Encounters:  07/30/21 194 lb 4.8 oz (88.1 kg)  07/18/21 193 lb (87.5 kg)  07/04/21 193 lb (87.5 kg)     Exam: General: Pt appears well and is in NAD  Neck: General: Supple without adenopathy. Thyroid: Thyroid size normal.  No goiter or nodules appreciated.  Lungs: Clear with good BS bilat with no rales, rhonchi, or wheezes  Heart:  RRR with normal S1 and S2 and no gallops; no murmurs; no rub  Abdomen: Normoactive bowel sounds, soft, nontender, without masses or organomegaly palpable  Extremities: No pretibial edema.  Neuro: MS is good with appropriate affect, pt is alert and Ox3       DM Foot Exam 07/30/2021 The skin of the feet is without sores or ulcerations but with thickened discolored nail  The pedal pulses are 1+ on right and 1+ on left. The sensation is intact to a screening 5.07, 10 gram monofilament bilaterally     DATA REVIEWED:  Lab Results  Component Value Date   HGBA1C 5.4 07/30/2021   HGBA1C 6.2 (A) 01/22/2021   HGBA1C 7.2 (H) 11/19/2020   Results for Huie, Jahron D "DALE" (MRN [id number]) as of 07/30/2021 11:15  Ref. Range 06/10/2021 10:44  Sodium Latest Ref Range: 134 - 144 mmol/L 139  Potassium Latest Ref Range: 3.5 - 5.2 mmol/L 4.7  Chloride Latest Ref Range: 96 - 106 mmol/L 103  CO2 Latest Ref Range: 20 - 29 mmol/L 25  Glucose Latest Ref Range: 65 - 99 mg/dL 119 (H)  BUN Latest Ref Range: 8 - 27 mg/dL 18  Creatinine Latest Ref Range: 0.76 - 1.27 mg/dL 0.93  Calcium Latest Ref Range: 8.6 - 10.2 mg/dL 9.7  BUN/Creatinine Ratio Latest Ref Range: 10 - 24  19  eGFR Latest Ref Range: >59 mL/min/1.73 91  Magnesium Latest Ref Range: 1.6 - 2.3 mg/dL 1.9    ASSESSMENT / PLAN / RECOMMENDATIONS:   1) Type 2 Diabetes Mellitus, with recurrent hypoglycemia, With Neuropathic complications - Most recent A1c of 5.4 %. Goal A1c < 7.0 %.    -He has stopped the metformin approximately a month ago due to hypoglycemia but continue to take glimepiride. -I explained to the patient that glimepiride causes hypoglycemia and not necessarily metformin. -We are going to stop the glimepiride and  restart metformin as below  MEDICATIONS:  -Stop glimepiride  -Restart metformin 500 mg, 1 tablet daily with breakfast  EDUCATION / INSTRUCTIONS: BG monitoring instructions: Patient is instructed to check his blood  sugars 1 times a day Call [facility] if: BG persistently < 70  I reviewed the Rule of 15 for the treatment of hypoglycemia in detail with the patient. Literature supplied.   2) Diabetic complications:  Eye: Does not have known diabetic retinopathy.  Neuro/ Feet: Does have known diabetic peripheral neuropathy .  Renal: Patient does not have known baseline CKD. He   is on an ACEI/ARB at present.     F/U in 4 months    Signed electronically by: Mack Guise, MD  [facility] Endocr[facility]ology  [facility] [address] [place], [place] [address] Phone: [phone] FAX: [phone]   CC: [address] [place] [address] Phone: [phone]  Fax: [phone]  Return to Endocrinology clinic as below: Future Appointments  Date Time Provider [place]  09/12/2021  1:40 PM Armbruster, Carlota Raspberry, MD [facility]

## 2021-07-30 NOTE — Patient Instructions (Addendum)
-   STOP  Glimepiride  - Restart  Metformin 500 mg, 1 tablet daily with Breakfast     HOW TO TREAT LOW BLOOD SUGARS (Blood sugar LESS THAN 70 MG/DL) Please follow the RULE OF 15 for the treatment of hypoglycemia treatment (when your (blood sugars are less than 70 mg/dL)   STEP 1: Take 15 grams of carbohydrates when your blood sugar is low, which includes:  3-4 GLUCOSE TABS  OR 3-4 OZ OF JUICE OR REGULAR SODA OR ONE TUBE OF GLUCOSE GEL    STEP 2: RECHECK blood sugar in 15 MINUTES STEP 3: If your blood sugar is still low at the 15 minute recheck --> then, go back to STEP 1 and treat AGAIN with another 15 grams of carbohydrates.

## 2021-07-31 ENCOUNTER — Other Ambulatory Visit (HOSPITAL_BASED_OUTPATIENT_CLINIC_OR_DEPARTMENT_OTHER): Payer: Self-pay

## 2021-07-31 ENCOUNTER — Other Ambulatory Visit: Payer: Self-pay | Admitting: Family Medicine

## 2021-07-31 DIAGNOSIS — R110 Nausea: Secondary | ICD-10-CM

## 2021-07-31 MED ORDER — TIZANIDINE HCL 4 MG PO TABS
4.0000 mg | ORAL_TABLET | Freq: Four times a day (QID) | ORAL | 1 refills | Status: DC | PRN
  Filled 2021-07-31: qty 30, 8d supply, fill #0
  Filled 2021-08-23: qty 30, 8d supply, fill #1

## 2021-07-31 MED ORDER — ONDANSETRON HCL 4 MG PO TABS
ORAL_TABLET | Freq: Three times a day (TID) | ORAL | 2 refills | Status: DC | PRN
Start: 2021-07-31 — End: 2021-11-04
  Filled 2021-07-31: qty 20, 7d supply, fill #0
  Filled 2021-08-12: qty 20, 7d supply, fill #1
  Filled 2021-08-23: qty 20, 7d supply, fill #2

## 2021-08-12 ENCOUNTER — Other Ambulatory Visit (HOSPITAL_BASED_OUTPATIENT_CLINIC_OR_DEPARTMENT_OTHER): Payer: Self-pay

## 2021-08-19 NOTE — Progress Notes (Signed)
Letter sent to patient re: F/U appointment on 9-15 at 1:40pm with Dr. Havery Moros. MyChart message sent by Koren Shiver, RN in July has not been seen.

## 2021-08-23 ENCOUNTER — Other Ambulatory Visit (HOSPITAL_BASED_OUTPATIENT_CLINIC_OR_DEPARTMENT_OTHER): Payer: Self-pay

## 2021-08-23 ENCOUNTER — Other Ambulatory Visit: Payer: Self-pay | Admitting: Family Medicine

## 2021-08-23 DIAGNOSIS — G8929 Other chronic pain: Secondary | ICD-10-CM

## 2021-08-23 DIAGNOSIS — G4700 Insomnia, unspecified: Secondary | ICD-10-CM

## 2021-08-23 MED FILL — Sertraline HCl Tab 100 MG: ORAL | 90 days supply | Qty: 135 | Fill #1 | Status: CN

## 2021-08-23 NOTE — Telephone Encounter (Signed)
Requesting: percocet 5-325 mg Contract:03/07/21 UDS:03/07/21 Last Visit:06/07/21 Next Visit:11/29/21 Last Refill:07/23/21  Please Advise

## 2021-08-26 ENCOUNTER — Other Ambulatory Visit: Payer: Self-pay | Admitting: Family Medicine

## 2021-08-26 ENCOUNTER — Other Ambulatory Visit (HOSPITAL_BASED_OUTPATIENT_CLINIC_OR_DEPARTMENT_OTHER): Payer: Self-pay

## 2021-08-26 DIAGNOSIS — G8929 Other chronic pain: Secondary | ICD-10-CM

## 2021-08-26 MED ORDER — TRAZODONE HCL 50 MG PO TABS
ORAL_TABLET | ORAL | 3 refills | Status: DC
Start: 2021-08-26 — End: 2021-12-28
  Filled 2021-08-26: qty 30, fill #0
  Filled 2021-09-10: qty 30, 30d supply, fill #0
  Filled 2021-10-07: qty 30, 30d supply, fill #1
  Filled 2021-11-04: qty 30, 30d supply, fill #2
  Filled 2021-11-28: qty 30, 30d supply, fill #3

## 2021-08-26 MED ORDER — OXYCODONE-ACETAMINOPHEN 5-325 MG PO TABS
ORAL_TABLET | ORAL | 0 refills | Status: DC
Start: 2021-08-26 — End: 2021-09-25
  Filled 2021-08-26: qty 120, 20d supply, fill #0

## 2021-08-26 MED FILL — Potassium Chloride Microencapsulated Crys ER Tab 20 mEq: ORAL | 90 days supply | Qty: 45 | Fill #1 | Status: AC

## 2021-08-26 NOTE — Telephone Encounter (Signed)
Requesting: OXYCODONE Contract: 03/07/21 UDS:03/07/21 Last Visit: 03/07/21 Next Visit: 11/29/21 Last Refill: 07/23/21  Please Advise

## 2021-08-27 ENCOUNTER — Other Ambulatory Visit (HOSPITAL_BASED_OUTPATIENT_CLINIC_OR_DEPARTMENT_OTHER): Payer: Self-pay

## 2021-08-28 ENCOUNTER — Other Ambulatory Visit (HOSPITAL_BASED_OUTPATIENT_CLINIC_OR_DEPARTMENT_OTHER): Payer: Self-pay

## 2021-09-10 ENCOUNTER — Other Ambulatory Visit (HOSPITAL_BASED_OUTPATIENT_CLINIC_OR_DEPARTMENT_OTHER): Payer: Self-pay

## 2021-09-12 ENCOUNTER — Ambulatory Visit: Payer: Medicare HMO | Admitting: Gastroenterology

## 2021-09-25 ENCOUNTER — Other Ambulatory Visit: Payer: Self-pay | Admitting: Cardiology

## 2021-09-25 ENCOUNTER — Other Ambulatory Visit (HOSPITAL_BASED_OUTPATIENT_CLINIC_OR_DEPARTMENT_OTHER): Payer: Self-pay

## 2021-09-25 ENCOUNTER — Other Ambulatory Visit: Payer: Self-pay | Admitting: Family Medicine

## 2021-09-25 DIAGNOSIS — R1013 Epigastric pain: Secondary | ICD-10-CM

## 2021-09-25 DIAGNOSIS — G8929 Other chronic pain: Secondary | ICD-10-CM

## 2021-09-25 DIAGNOSIS — R110 Nausea: Secondary | ICD-10-CM

## 2021-09-25 MED ORDER — RANOLAZINE ER 1000 MG PO TB12
ORAL_TABLET | Freq: Two times a day (BID) | ORAL | 3 refills | Status: DC
  Filled 2021-09-25: qty 60, 30d supply, fill #0
  Filled 2021-10-21: qty 60, 30d supply, fill #1
  Filled 2021-11-25: qty 60, 30d supply, fill #2

## 2021-09-25 MED ORDER — OXYCODONE-ACETAMINOPHEN 5-325 MG PO TABS
ORAL_TABLET | ORAL | 0 refills | Status: DC
  Filled 2021-09-25: qty 120, 30d supply, fill #0

## 2021-09-25 MED ORDER — PANTOPRAZOLE SODIUM 40 MG PO TBEC
DELAYED_RELEASE_TABLET | Freq: Every day | ORAL | 3 refills | Status: DC
  Filled 2021-09-25 – 2021-10-03 (×2): qty 30, 30d supply, fill #0

## 2021-09-25 MED FILL — Furosemide Tab 40 MG: ORAL | 90 days supply | Qty: 45 | Fill #1 | Status: AC

## 2021-09-25 NOTE — Telephone Encounter (Signed)
Requesting: oxycodone 5-325mg  Contract: 03/07/2021 UDS: 03/07/2021 Last Visit:06/07/2021 Next Visit: 11/29/2021 Last Refill: 08/26/2021 #120 and 0RF  Please Advise

## 2021-09-26 ENCOUNTER — Other Ambulatory Visit (HOSPITAL_BASED_OUTPATIENT_CLINIC_OR_DEPARTMENT_OTHER): Payer: Self-pay

## 2021-09-27 ENCOUNTER — Other Ambulatory Visit (HOSPITAL_BASED_OUTPATIENT_CLINIC_OR_DEPARTMENT_OTHER): Payer: Self-pay

## 2021-10-03 ENCOUNTER — Other Ambulatory Visit (HOSPITAL_BASED_OUTPATIENT_CLINIC_OR_DEPARTMENT_OTHER): Payer: Self-pay

## 2021-10-07 ENCOUNTER — Other Ambulatory Visit (HOSPITAL_BASED_OUTPATIENT_CLINIC_OR_DEPARTMENT_OTHER): Payer: Self-pay

## 2021-10-09 ENCOUNTER — Other Ambulatory Visit (HOSPITAL_BASED_OUTPATIENT_CLINIC_OR_DEPARTMENT_OTHER): Payer: Self-pay

## 2021-10-21 ENCOUNTER — Other Ambulatory Visit: Payer: Self-pay | Admitting: Family Medicine

## 2021-10-21 ENCOUNTER — Other Ambulatory Visit (HOSPITAL_BASED_OUTPATIENT_CLINIC_OR_DEPARTMENT_OTHER): Payer: Self-pay

## 2021-10-21 DIAGNOSIS — M25511 Pain in right shoulder: Secondary | ICD-10-CM

## 2021-10-21 DIAGNOSIS — I10 Essential (primary) hypertension: Secondary | ICD-10-CM

## 2021-10-21 DIAGNOSIS — G8929 Other chronic pain: Secondary | ICD-10-CM

## 2021-10-21 MED FILL — Rosuvastatin Calcium Tab 40 MG: ORAL | 90 days supply | Qty: 90 | Fill #1 | Status: AC

## 2021-10-21 MED FILL — Glucose Blood Test Strip: 90 days supply | Qty: 100 | Fill #2 | Status: AC

## 2021-10-21 MED FILL — Sertraline HCl Tab 100 MG: ORAL | 90 days supply | Qty: 135 | Fill #1 | Status: AC

## 2021-10-22 ENCOUNTER — Telehealth: Payer: Self-pay | Admitting: Orthopaedic Surgery

## 2021-10-22 ENCOUNTER — Other Ambulatory Visit (HOSPITAL_BASED_OUTPATIENT_CLINIC_OR_DEPARTMENT_OTHER): Payer: Self-pay

## 2021-10-22 MED ORDER — OXYCODONE-ACETAMINOPHEN 5-325 MG PO TABS
ORAL_TABLET | ORAL | 0 refills | Status: DC
  Filled 2021-10-22: qty 120, 20d supply, fill #0

## 2021-10-22 MED ORDER — LISINOPRIL 5 MG PO TABS
ORAL_TABLET | Freq: Every day | ORAL | 0 refills | Status: DC
  Filled 2021-10-22: qty 100, 100d supply, fill #0

## 2021-10-22 MED ORDER — TIZANIDINE HCL 4 MG PO TABS
4.0000 mg | ORAL_TABLET | Freq: Four times a day (QID) | ORAL | 1 refills | Status: DC | PRN
  Filled 2021-10-22: qty 30, 8d supply, fill #0
  Filled 2021-11-25: qty 30, 8d supply, fill #1

## 2021-10-22 NOTE — Telephone Encounter (Signed)
Called Ronald Petersen and left vm to set an appt with Dr. Lorin Mercy per Ronald Petersen request. Ronald Petersen appt needed for neck surgery consultation.

## 2021-10-22 NOTE — Telephone Encounter (Signed)
Requesting: oxycodone 5-325mg   Contract: 03/07/2021 UDS: 03/07/2021 Last Visit: 07/30/2021 Next Visit: 11/29/2021 Last Refill: 09/25/2021 #120 and 0RF  Please Advise

## 2021-10-28 ENCOUNTER — Other Ambulatory Visit (HOSPITAL_BASED_OUTPATIENT_CLINIC_OR_DEPARTMENT_OTHER): Payer: Self-pay

## 2021-10-28 ENCOUNTER — Telehealth: Payer: Self-pay | Admitting: Orthopaedic Surgery

## 2021-10-28 NOTE — Telephone Encounter (Signed)
Called pt 2 nd time left vm for pt to call and set neck surgery consultation with Dr. Lorin Mercy

## 2021-10-30 ENCOUNTER — Encounter: Payer: Self-pay | Admitting: Gastroenterology

## 2021-10-30 ENCOUNTER — Ambulatory Visit (INDEPENDENT_AMBULATORY_CARE_PROVIDER_SITE_OTHER): Payer: Medicare HMO | Admitting: Gastroenterology

## 2021-10-30 ENCOUNTER — Other Ambulatory Visit (HOSPITAL_BASED_OUTPATIENT_CLINIC_OR_DEPARTMENT_OTHER): Payer: Self-pay

## 2021-10-30 VITALS — BP 112/70 | HR 56 | Ht 71.0 in | Wt 190.1 lb

## 2021-10-30 DIAGNOSIS — R109 Unspecified abdominal pain: Secondary | ICD-10-CM | POA: Diagnosis not present

## 2021-10-30 DIAGNOSIS — Z79891 Long term (current) use of opiate analgesic: Secondary | ICD-10-CM

## 2021-10-30 DIAGNOSIS — R110 Nausea: Secondary | ICD-10-CM | POA: Diagnosis not present

## 2021-10-30 DIAGNOSIS — K5900 Constipation, unspecified: Secondary | ICD-10-CM | POA: Diagnosis not present

## 2021-10-30 DIAGNOSIS — K219 Gastro-esophageal reflux disease without esophagitis: Secondary | ICD-10-CM | POA: Diagnosis not present

## 2021-10-30 MED ORDER — PANTOPRAZOLE SODIUM 40 MG PO TBEC
DELAYED_RELEASE_TABLET | ORAL | 5 refills | Status: DC
Start: 2021-10-30 — End: 2021-12-02

## 2021-10-30 MED ORDER — DICYCLOMINE HCL 10 MG PO CAPS
10.0000 mg | ORAL_CAPSULE | Freq: Three times a day (TID) | ORAL | 1 refills | Status: DC | PRN
  Filled 2021-10-30: qty 30, 10d supply, fill #0
  Filled 2021-11-25: qty 30, 10d supply, fill #1

## 2021-10-30 NOTE — Progress Notes (Signed)
HPI :  65 year old male here for follow-up visit regarding multiple symptoms to include constipation, abdominal pain, nausea, GERD.  He was last seen in our office by Tye Savoy in May 2022.  He had a subsequent EGD and colonoscopy with Dr. Bryan Lemma on July 21 as outlined:  EGD 07/18/21: - The examined esophagus was normal. - Mild inflammation characterized by congestion (edema) and erythema was found in the gastric body, at the incisura and in the gastric antrum. Biopsies were taken with a cold forceps for Helicobacter pylori testing. Estimated blood loss was minimal. - The examined duodenum was normal. Biopsies were taken with a cold forceps for histology. Estimated blood loss was minimal.  Colonoscopy 07/18/21:The perianal and digital rectal examinations were normal. - Two sessile polyps were found in the sigmoid colon. The polyps were 2 to 4 mm in size. These polyps were removed with a cold snare. Resection and retrieval were complete. Estimated blood loss was minimal. - Small, non-bleeding internal hemorrhoids and hypertrophied anal papillae were found during retroflexion. - The exam was otherwise normal throughout the remainder of the colon. No areas of mucosal erythema, edema, ulceration, or erosions noted. - The terminal ileum appeared normal.  1. Surgical [P], random duodenal sites - BENIGN SMALL BOWEL MUCOSA. - NO VILLOUS BLUNTING OR INCREASE IN INTRAEPITHELIAL LYMPHOCYTES. - NO DYSPLASIA OR MALIGNANCY. 2. Surgical [P], gastric antrum and gastric body - REACTIVE GASTROPATHY. [place] IS NEGATIVE FOR HELICOBACTER PYLORI. - NO INTESTINAL METAPLASIA, DYSPLASIA, OR MALIGNANCY. 3. Surgical [P], colon, sigmoid, polyp (2) - TUBULAR ADENOMA. - HYPERPLASTIC POLYP. - NO HIGH GRADE DYSPLASIA OR MALIGNANCY.  Repeat colonoscopy in 7 years   He states he has had history of reflux for some time.  Takes Protonix once daily for the most part.  That historically has  worked pretty well to treat his indigestion but states has had some worsening of symptoms and having some breakthrough on once daily dosing.  He does feel nauseated frequently but does not vomit.  He denies any trigger foods that can cause the symptoms.  States nighttime symptoms to be the worst in regards to his reflux.  He is prescribed Zofran to use as needed for nausea but states he does not take it very much.  He does take oxycodone 4 times daily for chronic neck pain.  States he is on narcotics for a long time and due to follow-up to see his neurosurgeon and considering neck surgery to try to come off narcotics.  He also takes gabapentin chronically.  He also complains of left sided abdominal to flank pain.  This is located below the rib and not on it.  This is been ongoing for years at this point.  Tends to come and go, no radiation of the pain, localizes only to that area.  He will feel it 3-4 times per month or 2.  He states he can be rated at 8 out of 10 and can last for hours to days at a time.  He can feel worsening nausea with this.  Sometimes having a bowel movement to relieve his discomfort.  He has been more constipated than usual can have a bowel movement upwards of every 6 days at most, usually at every 3 to 4 days, at best every other day.  He has been given MiraLAX to take in the past but using only as needed not routinely.  He has had prior CT imaging to evaluate this pain as below which did not show a  clear cause.   PREVIOUS EVALUATIONS:    2017 Colonoscopy for polyp surveillance --complete exam, adequate prep --Tortous colon. No polyps otherwise, normal colon, nternal hemorrhoids, hypertrophied anal papillae   04/25/21 CT scan w/ contrast IMPRESSION: 1. Mild diffuse bladder wall thickening may indicate cystitis. 2. No evidence of bowel obstruction or inflammation. 3. Mildly enlarged prostate gland. 4. Minimal periumbilical hernia containing fat.    Past Medical History:   Diagnosis Date   Anxiety    Arthritis    Cancer ([place])    Complication of anesthesia    aspirated with back surgery at age 49   Depression    Diabetes mellitus Type II    GERD (gastroesophageal reflux disease)    Hyperlipidemia    Hypertension    Neuromuscular disorder ([place])    NEUROPATHY   Right rotator cuff tear 11/23/2018   Sleep apnea    no CPAP     Past Surgical History:  Procedure Laterality Date   APPENDECTOMY     ARTHOSCOPIC ROTAOR CUFF REPAIR Right 11/23/2018   Procedure: ARTHROSCOPIC ROTATOR CUFF REPAIR;  Surgeon: [name], MD;  Location: [facility];  Service: Orthopedics;  Laterality: Right;   CHOLECYSTECTOMY     CORONARY STENT INTERVENTION N/A 11/19/2020   Procedure: CORONARY STENT INTERVENTION;  Surgeon: Nelva Bush, MD;  Location: [facility];  Service: Cardiovascular;  Laterality: N/A;   INTRAVASCULAR ULTRASOUND/IVUS N/A 11/19/2020   Procedure: Intravascular Ultrasound/IVUS;  Surgeon: Nelva Bush, MD;  Location: [facility];  Service: Cardiovascular;  Laterality: N/A;   LEFT HEART CATH AND CORONARY ANGIOGRAPHY N/A 11/19/2020   Procedure: LEFT HEART CATH AND CORONARY ANGIOGRAPHY;  Surgeon: Nelva Bush, MD;  Location: [facility];  Service: Cardiovascular;  Laterality: N/A;   LEFT HEART CATH AND CORONARY ANGIOGRAPHY N/A 12/24/2020   Procedure: LEFT HEART CATH AND CORONARY ANGIOGRAPHY;  Surgeon: Martinique, Peter M, MD;  Location: [facility];  Service: Cardiovascular;  Laterality: N/A;   LUMBAR LAMINECTOMY     SHOULDER ARTHROSCOPY WITH ROTATOR CUFF REPAIR AND SUBACROMIAL DECOMPRESSION Right 11/23/2018   Procedure: SHOULDER ARTHROSCOPY WITH ROTATOR CUFF REPAIR AND SUBACROMIAL DECOMPRESSION;  Surgeon: [name], MD;  Location: [facility];  Service: Orthopedics;  Laterality: Right;   Family History  Problem Relation Age of Onset   COPD Mother    Stroke Father    Ovarian cancer Sister     Stomach cancer Sister    Heart disease Sister        MI   Heart disease Sister        MI   Stomach cancer Maternal Grandmother    Alcohol abuse Other    Depression Other    Arthritis Other    Hypertension Other    Coronary artery disease Other    Ovarian cancer Other        neice   Ovarian cancer Other        neice   Colon polyps Neg Hx    Colon cancer Neg Hx    Social History   Tobacco Use   Smoking status: Former    Years: 16.00    Types: Cigarettes    Quit date: 12/29/1985    Years since quitting: 35.8   Smokeless tobacco: Never  Vaping Use   Vaping Use: Never used  Substance Use Topics   Alcohol use: Yes    Alcohol/week: 4.0 - 5.0 standard drinks    Types: 4 - 5 Cans of beer per  week    Comment: 5 times per week   Drug use: No   Current Outpatient Medications  Medication Sig Dispense Refill   aspirin EC 81 MG tablet Take 1 tablet (81 mg total) by mouth daily. Swallow whole. 90 tablet 3   Continuous Blood Gluc Receiver (FREESTYLE LIBRE 2 READER) DEVI Use as directed to check blood sugar 1 each 0   Continuous Blood Gluc Sensor (FREESTYLE LIBRE 2 SENSOR) MISC Use to check blood sugar, change every 14 days 2 each 6   fenofibrate 160 MG tablet TAKE 1 TABLET (160 MG TOTAL) BY MOUTH DAILY. 90 tablet 1   furosemide (LASIX) 40 MG tablet TAKE 1 TABLET (40 MG TOTAL) BY MOUTH EVERY OTHER DAY. 90 tablet 3   gabapentin (NEURONTIN) 800 MG tablet Take 1 tablet (800 mg total) by mouth 3 (three) times daily. 270 tablet 2   glucose blood test strip USE AS INSTRUCTED TO CHECK BLOOD SUGAR ONCE A DAY (Patient taking differently: USE AS INSTRUCTED TO CHECK BLOOD SUGAR ONCE A DAY) 100 strip 12   lisinopril (ZESTRIL) 5 MG tablet TAKE 1 TABLET (5 MG TOTAL) BY MOUTH DAILY. 100 tablet 0   metFORMIN (GLUCOPHAGE-XR) 500 MG 24 hr tablet Take 1 tablet (500 mg total) by mouth daily with breakfast. 90 tablet 3   ondansetron (ZOFRAN) 4 MG tablet TAKE 1 TABLET BY MOUTH EVERY 8 HOURS AS NEEDED 20  tablet 2   oxyCODONE-acetaminophen (PERCOCET/ROXICET) 5-325 MG tablet TAKE 1 TABLET BY MOUTH EVERY 4 (FOUR) HOURS AS NEEDED FOR SEVERE PAIN. 120 tablet 0   pantoprazole (PROTONIX) 40 MG tablet TAKE 1 TABLET (40 MG TOTAL) BY MOUTH DAILY. 30 tablet 3   polyethylene glycol powder (GLYCOLAX/MIRALAX) 17 GM/SCOOP powder Take 17 g by mouth as needed.     potassium chloride SA (KLOR-CON) 20 MEQ tablet TAKE 1 TABLET (20 MEQ TOTAL) BY MOUTH EVERY OTHER DAY. 90 tablet 3   ranolazine (RANEXA) 1000 MG SR tablet TAKE 1 TABLET (1,000 MG TOTAL) BY MOUTH 2 (TWO) TIMES DAILY. 60 tablet 3   rosuvastatin (CRESTOR) 40 MG tablet TAKE 1 TABLET BY MOUTH ONCE DAILY 90 tablet 3   sertraline (ZOLOFT) 100 MG tablet TAKE ONE AND ONE-HALF TABLET BY MOUTH ONCE DAILY 135 tablet 3   ticagrelor (BRILINTA) 90 MG TABS tablet TAKE 1 TABLET (90 MG TOTAL) BY MOUTH TWO TIMES DAILY. 180 tablet 1   tiZANidine (ZANAFLEX) 4 MG tablet TAKE 1 TABLET (4 MG TOTAL) BY MOUTH EVERY 6 (SIX) HOURS AS NEEDED FOR MUSCLE SPASMS. 30 tablet 1   traZODone (DESYREL) 50 MG tablet TAKE 1/2-1 TABLET (25-50 MG TOTAL) BY MOUTH AT BEDTIME AS NEEDED FOR SLEEP. 30 tablet 3   nitroGLYCERIN (NITROSTAT) 0.4 MG SL tablet PLACE 1 TABLET (0.4 MG TOTAL) UNDER THE TONGUE EVERY 5 (FIVE) MINUTES AS NEEDED FOR CHEST PAIN. (Patient not taking: Reported on 10/30/2021) 90 tablet 3   No current facility-administered medications for this visit.   Allergies  Allergen Reactions   Niacin Anaphylaxis     Review of Systems: All systems reviewed and negative except where noted in HPI.   Lab Results  Component Value Date   WBC 6.5 04/25/2021   HGB 13.9 04/25/2021   HCT 40.9 04/25/2021   MCV 85.0 04/25/2021   PLT 197 04/25/2021    Lab Results  Component Value Date   CREATININE 0.93 06/10/2021   BUN 18 06/10/2021   NA 139 06/10/2021   K 4.7 06/10/2021   CL 103 06/10/2021  CO2 25 06/10/2021    Lab Results  Component Value Date   ALT 16 04/25/2021   AST 20  04/25/2021   ALKPHOS 47 04/25/2021   BILITOT 0.4 04/25/2021     Physical Exam: BP 112/70 (BP Location: Left Arm, Patient Position: Sitting, Cuff Size: Normal)   Pulse (!) 56   Ht 5\' 11"  (1.803 m) Comment: height measured without shoes  Wt 190 lb 2 oz (86.2 kg)   BMI 26.52 kg/m  Constitutional: Pleasant,well-developed, male in no acute distress. Abdominal: Soft, nondistended, nontender.  There are no masses palpable.  Extremities: no edema Neurological: Alert and oriented to person place and time. Skin: Skin is warm and dry. No rashes noted. Psychiatric: Normal mood and affect. Behavior is normal.   ASSESSMENT AND PLAN: 65 year old male here for reassessment of the following:  Abdominal discomfort - left side Constipation GERD Chronic nausea Chronic opioid use  History as outlined above.  CT scan did not show anything to cause his abdominal pain.  Likewise his colonoscopy and endoscopy did not show a clear cause either.  Wonder if this could be related to his ongoing constipation which is poorly controlled.  We discussed his long-term opiate regimen may not be in his best interest and I think causing constipation and his chronic nausea.  He is following up with his surgeon but that may be needed to get him off of opiates which he is interested in doing.  Until then he needs to take his MiraLAX every day and likely twice daily initially to treat his constipation told me to titrate up further if needed.  If this does not work he should let me know and we can try other options.  I will give him some Bentyl to use as needed for abdominal pain to see if that helps at all when he gets it.  Regarding his reflux he has no Barrett's esophagus on EGD but he continues to have some symptoms of bother him periodically.  He will try twice daily dosing of Protonix as needed to see if that will help resolve his indigestion as needed.  We discussed long-term risks of PPI and want to use lowest dose  needed to control symptoms.  Otherwise he is not using Zofran routinely and that does really help his nausea, he can use it daily to few times a day as needed.  Otherwise bladder thickening noted on his prior CT scan, he does have some occasional urinary symptoms, recommend he discuss with his primary care to determine if he needs to see a urologist.  He agreed  Plan: - long term would be best to come off opiates - may be having neck surgery - take Miralax every day and titrate to BID if needed. If this does not work will escalate regimen - trial of bentyl 10mg  every 8 hoours PRN  - trial of increase protonix to 40mg  BID for a few weeks to see if that helps - take Zofran daily to BID for a few weeks and see if that helps as well - can see PCP about his bladder / CT results  Jolly Mango, MD [facility]

## 2021-10-30 NOTE — Patient Instructions (Addendum)
If you are age 67 or older, your body mass index should be between 23-30. Your Body mass index is 26.52 kg/m. If this is out of the aforementioned range listed, please consider follow up with your Primary Care Provider.  If you are age 21 or younger, your body mass index should be between 19-25. Your Body mass index is 26.52 kg/m. If this is out of the aformentioned range listed, please consider follow up with your Primary Care Provider.   ________________________________________________________  The [facility] providers would like to encourage you to use [facility] to communicate with providers for non-urgent requests or questions.  Due to long hold times on the telephone, sending your provider a message by [facility] may be a faster and more efficient way to get a response.  Please allow 48 business hours for a response.  Please remember that this is for non-urgent requests.  _______________________________________________________  Take Miralax once a day to twice a day .  We have sent the following medications to your pharmacy for you to pick up at your convenience:  Bentyl 10 mg: Take 1 tablet every 8 hours as needed  Increase your Protonix to twice a day for a few weeks.    Take Zofran once to twice a day for a few weeks.  Please follow up with your Primary Care Provider regarding your bladder.  Thank you for entrusting me with your care and for choosing [facility], Dr. Summerlin [place]

## 2021-11-04 ENCOUNTER — Other Ambulatory Visit: Payer: Self-pay | Admitting: Family Medicine

## 2021-11-04 ENCOUNTER — Other Ambulatory Visit (HOSPITAL_BASED_OUTPATIENT_CLINIC_OR_DEPARTMENT_OTHER): Payer: Self-pay

## 2021-11-04 DIAGNOSIS — R110 Nausea: Secondary | ICD-10-CM

## 2021-11-05 ENCOUNTER — Other Ambulatory Visit (HOSPITAL_BASED_OUTPATIENT_CLINIC_OR_DEPARTMENT_OTHER): Payer: Self-pay

## 2021-11-05 ENCOUNTER — Ambulatory Visit (INDEPENDENT_AMBULATORY_CARE_PROVIDER_SITE_OTHER): Payer: Medicare HMO

## 2021-11-05 ENCOUNTER — Other Ambulatory Visit: Payer: Self-pay

## 2021-11-05 ENCOUNTER — Encounter: Payer: Self-pay | Admitting: Orthopaedic Surgery

## 2021-11-05 ENCOUNTER — Ambulatory Visit (INDEPENDENT_AMBULATORY_CARE_PROVIDER_SITE_OTHER): Payer: Medicare HMO | Admitting: Orthopaedic Surgery

## 2021-11-05 VITALS — BP 143/69 | HR 54 | Ht 71.0 in | Wt 185.0 lb

## 2021-11-05 DIAGNOSIS — M542 Cervicalgia: Secondary | ICD-10-CM

## 2021-11-05 DIAGNOSIS — G8929 Other chronic pain: Secondary | ICD-10-CM

## 2021-11-05 DIAGNOSIS — M25511 Pain in right shoulder: Secondary | ICD-10-CM

## 2021-11-05 MED ORDER — POLYETHYLENE GLYCOL 3350 17 GM/SCOOP PO POWD
17.0000 g | Freq: Every day | ORAL | 1 refills | Status: DC
  Filled 2021-11-05: qty 510, 15d supply, fill #0

## 2021-11-05 MED ORDER — ONDANSETRON HCL 4 MG PO TABS
4.0000 mg | ORAL_TABLET | Freq: Three times a day (TID) | ORAL | 2 refills | Status: DC | PRN
  Filled 2021-11-05: qty 20, 7d supply, fill #0
  Filled 2021-11-25: qty 20, 7d supply, fill #1
  Filled 2022-01-12: qty 20, 7d supply, fill #2

## 2021-11-05 NOTE — Progress Notes (Addendum)
Office Visit Note   Patient: Ronald Petersen           Date of Birth: 01/20/1956           MRN: [id number] Visit Date: 11/05/2021              Requested by: [address], [place], [place] [address] [place],  [place] [address] PCP: [name], Alferd Apa, DO   Assessment & Plan: Visit Diagnoses:  1. Neck pain   2. Chronic right shoulder pain     Plan: Patient needs updated MRI scan and then we can schedule him for cervical fusion C5-6 C6-7.  He need cardiology clearance with his cardiac history.  Office follow-up after new MRI scan.  Follow-Up Instructions: No follow-ups on file.   Orders:  Orders Placed This Encounter  Procedures   XR Cervical Spine 2 or 3 views   XR Shoulder Right   No orders of the defined types were placed in this encounter.     Procedures: No procedures performed   Clinical Data: No additional findings.   Subjective: Chief Complaint  Patient presents with   Neck - Pain   Right Shoulder - Pain   Left Shoulder - Pain    HPI patient turns he states he has been active doing some caring lifting activities with increased pain in his neck.  Previous rotator cuff repair observable anchors by Dr. Noemi Chapel.  He states his shoulders been bothering him more he recently did some room painting house painting.  He is able get his arm up over his head but has more discomfort.  He is concerned he is retiring his rotator cuff.  Patient states next got worse she is now ready to proceed with surgery.  He has a cardiology visit coming up in early December.  Previous MRI was back in March showed severe left moderate right foraminal stenosis C5-6 and C6-7.  Does have some effacement of the ventral CSF noted at both levels.  Patient states he wants to proceed with a new MRI and get scheduled for cervical fusion procedure as we had previously discussed back in March 2022.  Review of Systems positive coronary disease, cervical stenosis, type 2 diabetes with  neuropathy.  Negative for angina.   Objective: Vital Signs: BP (!) 143/69   Pulse (!) 54   Ht 5\' 11"  (1.803 m)   Wt 185 lb (83.9 kg)   BMI 25.80 kg/m   Physical Exam Constitutional:      Appearance: He is well-developed.  HENT:     Head: Normocephalic and atraumatic.     Right Ear: External ear normal.     Left Ear: External ear normal.  Eyes:     Pupils: Pupils are equal, round, and reactive to light.  Neck:     Thyroid: No thyromegaly.     Trachea: No tracheal deviation.  Cardiovascular:     Rate and Rhythm: Normal rate.  Pulmonary:     Effort: Pulmonary effort is normal.     Breath sounds: No wheezing.  Abdominal:     General: Bowel sounds are normal.     Palpations: Abdomen is soft.  Musculoskeletal:     Cervical back: Neck supple.  Skin:    General: Skin is warm and dry.     Capillary Refill: Capillary refill takes less than 2 seconds.  Neurological:     Mental Status: He is alert and oriented to person, place, and time.  Psychiatric:        Behavior: Behavior normal.        Thought Content: Thought content normal.        Judgment: Judgment normal.    Ortho Exam patient has brachial plexus tenderness right and left.  Weakness with isolated supraspinatus testing right arm.  Long head of biceps mildly tender which radiates to the mid bicep region.  Positive Neer positive Hawkins.  Negative Yergason.  Specialty Comments:  No specialty comments available.  Imaging: Narrative & Impression  CLINICAL DATA:  Neck pain with bilateral upper extremity pain   EXAM: MRI CERVICAL SPINE WITHOUT CONTRAST   TECHNIQUE: Multiplanar, multisequence MR imaging of the cervical spine was performed. No intravenous contrast was administered.   COMPARISON:  X-ray 11/05/2021.  MRI 03/04/2021   FINDINGS: Alignment: No significant listhesis.   Vertebrae: No fracture, evidence of discitis, or bone lesion. Discogenic endplate marrow changes most pronounced at C6-7.   Cord:  Normal signal and morphology.   Posterior Fossa, vertebral arteries, paraspinal tissues: Negative.   Disc levels:   C2-C3: Unremarkable.   C3-C4: Small disc osteophyte complex, slightly eccentric to the left. Unremarkable facet joints. Moderate left foraminal stenosis. No canal stenosis. Unchanged.   C4-C5: Minimal disc osteophyte complex. Mild facet hypertrophy. Mild bilateral foraminal stenosis. No canal stenosis. Unchanged.   C5-C6: Disc osteophyte complex with left uncovertebral spurring. Minimal facet hypertrophy on the left. Severe left and mild-moderate right foraminal stenosis. No significant canal stenosis. Unchanged.   C6-C7: Disc osteophyte complex with bilateral uncovertebral spurring resulting in moderate to severe left and moderate right foraminal stenosis. No canal stenosis. Unchanged.   C7-T1: Unremarkable.   IMPRESSION: 1. No significant interval progression of multilevel cervical spondylosis, as described above. No significant canal stenosis at any level. 2. Severe left foraminal stenosis at C5-6 and moderate-to-severe left foraminal stenosis at C6-7.     Electronically Signed   By: Davina Poke D.O.   On: 11/27/2021 13:39     PMFS History: Patient Active Problem List   Diagnosis Date Noted   Chronic pain syndrome 06/07/2021   Mild neurocognitive disorder due to multiple etiologies 05/10/2021   Type II diabetes mellitus with hypoglycemia ([place]) 01/22/2021   Type 2 diabetes mellitus with diabetic polyneuropathy, without long-term current use of insulin ([facility]) 26/94/8546   Metabolic syndrome 27/02/5008   Coronary artery disease 01/17/2021   Anxiety    Arthritis    Cancer ([place])    Complication of anesthesia    GERD (gastroesophageal reflux disease)    Hyperlipidemia    Hypertension    Neuromuscular disorder ([place])    Sleep apnea    Coronary artery disease involving native coronary artery of native heart with angina pectoris ([place]) 11/20/2020    CAD S/P DES PCI LAD & LCx-OM 11/20/2020   Unstable angina ([place]) 11/19/2020   Abnormal cardiac CT angiography 11/19/2020   Shortness of breath 10/02/2020   Dizziness 10/02/2020   Chest pain 10/02/2020   Obesity (BMI 30-39.9) 10/02/2020   Type 2 diabetes mellitus with diabetic neuropathy, without long-term current use of insulin ([place]) 06/06/2020   Uncontrolled type 2 diabetes mellitus with hyperglycemia ([facility]) 05/31/2020   Blurry vision 02/07/2020   Spinal stenosis of lumbar region 02/07/2020   Frequent falls 02/07/2020   Foraminal stenosis of cervical region 07/26/2019   Aortic atherosclerosis ([place]) 05/27/2019   Chronic bilateral low back pain without sciatica 05/20/2019   Carpal tunnel syndrome, bilateral 05/20/2019   Leg weakness, bilateral 01/05/2019   Falling  episodes 01/05/2019   Right rotator cuff tear 11/23/2018   Neck pain 11/04/2018   Acute pain of right shoulder 11/04/2018   Gastroesophageal reflux disease 11/04/2018   Hyperlipidemia associated with type 2 diabetes mellitus ([place]) 11/04/2018   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 06/28/2018   Essential hypertension 09/14/2017   Preventative health care 06/12/2016   OSA (obstructive sleep apnea) 04/07/2016   Joint pain 04/05/2016   NSAID long-term use 12/13/2015   Nausea without vomiting 12/13/2015   History of colonic polyps 12/13/2015   Weight loss 12/13/2015   Depression 11/06/2015   Metatarsal deformity 02/20/2015   Equinus deformity of foot, acquired 02/20/2015   Plantar fasciitis, bilateral 02/15/2015   Left wrist injury 06/15/2014   Peripheral neuropathy 04/01/2012   Fatigue 11/05/2010   OTHER SPECIFIED DISORDER OF PENIS 03/26/2010   NAUSEA 03/26/2010   Pain in limb 10/17/2008   NECK PAIN, CHRONIC 11/24/2007   DM (diabetes mellitus) type II uncontrolled, periph vascular disorder 01/27/2007   Hyperlipidemia LDL goal <70 01/27/2007   Past Medical History:  Diagnosis Date    Anxiety    Arthritis    Cancer ([facility])    Complication of anesthesia    aspirated with back surgery at age 71   Depression    Diabetes mellitus Type II    GERD (gastroesophageal reflux disease)    Hyperlipidemia    Hypertension    Neuromuscular disorder ([facility])    NEUROPATHY   Right rotator cuff tear 11/23/2018   Sleep apnea    no CPAP    Family History  Problem Relation Age of Onset   COPD Mother    Stroke Father    Ovarian cancer Sister    Stomach cancer Sister    Heart disease Sister        MI   Heart disease Sister        MI   Stomach cancer Maternal Grandmother    Alcohol abuse Other    Depression Other    Arthritis Other    Hypertension Other    Coronary artery disease Other    Ovarian cancer Other        neice   Ovarian cancer Other        neice   Colon polyps Neg Hx    Colon cancer Neg Hx     Past Surgical History:  Procedure Laterality Date   APPENDECTOMY     ARTHOSCOPIC ROTAOR CUFF REPAIR Right 11/23/2018   Procedure: ARTHROSCOPIC ROTATOR CUFF REPAIR;  Surgeon: [name], MD;  Location: [facility];  Service: Orthopedics;  Laterality: Right;   CHOLECYSTECTOMY     CORONARY STENT INTERVENTION N/A 11/19/2020   Procedure: CORONARY STENT INTERVENTION;  Surgeon: Nelva Bush, MD;  Location: [facility];  Service: Cardiovascular;  Laterality: N/A;   INTRAVASCULAR ULTRASOUND/IVUS N/A 11/19/2020   Procedure: Intravascular Ultrasound/IVUS;  Surgeon: Nelva Bush, MD;  Location: [facility];  Service: Cardiovascular;  Laterality: N/A;   LEFT HEART CATH AND CORONARY ANGIOGRAPHY N/A 11/19/2020   Procedure: LEFT HEART CATH AND CORONARY ANGIOGRAPHY;  Surgeon: Nelva Bush, MD;  Location: [facility];  Service: Cardiovascular;  Laterality: N/A;   LEFT HEART CATH AND CORONARY ANGIOGRAPHY N/A 12/24/2020   Procedure: LEFT HEART CATH AND CORONARY ANGIOGRAPHY;  Surgeon: Martinique, Peter M, MD;  Location: [facility];   Service: Cardiovascular;  Laterality: N/A;   LUMBAR LAMINECTOMY     SHOULDER ARTHROSCOPY WITH ROTATOR  CUFF REPAIR AND SUBACROMIAL DECOMPRESSION Right 11/23/2018   Procedure: SHOULDER ARTHROSCOPY WITH ROTATOR CUFF REPAIR AND SUBACROMIAL DECOMPRESSION;  Surgeon: [name], MD;  Location: [facility];  Service: Orthopedics;  Laterality: Right;   Social History   Occupational History   Occupation: self employed    Fish farm manager: UNEMPLOYED  Tobacco Use   Smoking status: Former    Years: 16.00    Types: Cigarettes    Quit date: 12/29/1985    Years since quitting: 35.8   Smokeless tobacco: Never  Vaping Use   Vaping Use: Never used  Substance and Sexual Activity   Alcohol use: Yes    Alcohol/week: 4.0 - 5.0 standard drinks    Types: 4 - 5 Cans of beer per week    Comment: 5 times per week   Drug use: No   Sexual activity: Yes    Partners: Female

## 2021-11-06 ENCOUNTER — Other Ambulatory Visit (HOSPITAL_BASED_OUTPATIENT_CLINIC_OR_DEPARTMENT_OTHER): Payer: Self-pay

## 2021-11-06 ENCOUNTER — Encounter: Payer: Self-pay | Admitting: Orthopaedic Surgery

## 2021-11-07 ENCOUNTER — Other Ambulatory Visit (HOSPITAL_BASED_OUTPATIENT_CLINIC_OR_DEPARTMENT_OTHER): Payer: Self-pay

## 2021-11-11 ENCOUNTER — Telehealth: Payer: Self-pay | Admitting: Orthopaedic Surgery

## 2021-11-11 NOTE — Telephone Encounter (Signed)
Left message for pt to return phone call to sch for post MRI after 11/26/21 with MY per Gabriel Cirri

## 2021-11-18 ENCOUNTER — Other Ambulatory Visit (HOSPITAL_BASED_OUTPATIENT_CLINIC_OR_DEPARTMENT_OTHER): Payer: Self-pay

## 2021-11-18 ENCOUNTER — Other Ambulatory Visit: Payer: Self-pay

## 2021-11-18 MED FILL — Ticagrelor Tab 90 MG: ORAL | 30 days supply | Qty: 60 | Fill #0 | Status: CN

## 2021-11-25 ENCOUNTER — Other Ambulatory Visit: Payer: Self-pay | Admitting: Family Medicine

## 2021-11-25 ENCOUNTER — Other Ambulatory Visit: Payer: Self-pay | Admitting: Podiatry

## 2021-11-25 ENCOUNTER — Other Ambulatory Visit (HOSPITAL_BASED_OUTPATIENT_CLINIC_OR_DEPARTMENT_OTHER): Payer: Self-pay

## 2021-11-25 DIAGNOSIS — R110 Nausea: Secondary | ICD-10-CM

## 2021-11-25 DIAGNOSIS — R1013 Epigastric pain: Secondary | ICD-10-CM

## 2021-11-25 MED ORDER — GABAPENTIN 800 MG PO TABS
800.0000 mg | ORAL_TABLET | Freq: Three times a day (TID) | ORAL | 2 refills | Status: DC
  Filled 2021-11-25: qty 270, 90d supply, fill #0
  Filled 2022-02-25: qty 270, 90d supply, fill #1
  Filled 2022-04-24 – 2022-05-24 (×2): qty 270, 90d supply, fill #2

## 2021-11-25 MED FILL — Potassium Chloride Microencapsulated Crys ER Tab 20 mEq: ORAL | 90 days supply | Qty: 45 | Fill #2 | Status: AC

## 2021-11-26 ENCOUNTER — Other Ambulatory Visit: Payer: Self-pay

## 2021-11-26 ENCOUNTER — Other Ambulatory Visit (HOSPITAL_BASED_OUTPATIENT_CLINIC_OR_DEPARTMENT_OTHER): Payer: Self-pay

## 2021-11-26 ENCOUNTER — Ambulatory Visit
Admission: RE | Admit: 2021-11-26 | Discharge: 2021-11-26 | Disposition: A | Discharge status: Home / Self Care | Payer: Medicare HMO | Source: Ambulatory Visit | Attending: Orthopaedic Surgery | Admitting: Orthopaedic Surgery

## 2021-11-26 DIAGNOSIS — M542 Cervicalgia: Secondary | ICD-10-CM | POA: Diagnosis not present

## 2021-11-26 DIAGNOSIS — M4802 Spinal stenosis, cervical region: Secondary | ICD-10-CM | POA: Diagnosis not present

## 2021-11-28 ENCOUNTER — Ambulatory Visit: Payer: Medicare HMO | Admitting: Family Medicine

## 2021-11-28 ENCOUNTER — Other Ambulatory Visit (HOSPITAL_BASED_OUTPATIENT_CLINIC_OR_DEPARTMENT_OTHER): Payer: Self-pay

## 2021-11-28 DIAGNOSIS — G5600 Carpal tunnel syndrome, unspecified upper limb: Secondary | ICD-10-CM | POA: Diagnosis not present

## 2021-11-28 DIAGNOSIS — M542 Cervicalgia: Secondary | ICD-10-CM | POA: Diagnosis not present

## 2021-11-28 DIAGNOSIS — M5412 Radiculopathy, cervical region: Secondary | ICD-10-CM | POA: Diagnosis not present

## 2021-11-29 ENCOUNTER — Telehealth: Payer: Self-pay

## 2021-11-29 ENCOUNTER — Other Ambulatory Visit (HOSPITAL_BASED_OUTPATIENT_CLINIC_OR_DEPARTMENT_OTHER): Payer: Self-pay

## 2021-11-29 ENCOUNTER — Ambulatory Visit: Payer: Medicare HMO | Admitting: Orthopaedic Surgery

## 2021-11-29 ENCOUNTER — Ambulatory Visit: Payer: Medicare HMO | Admitting: Family Medicine

## 2021-11-29 NOTE — Telephone Encounter (Signed)
   [facility] Pre-operative Risk Assessment    Patient Name: Ronald Petersen  DOB: [birth year] MRN: [id number]  Request for surgical clearance:  What type of surgery is being performed? C5-6, C6-7 ANTERIOR CERVICAL FUSION, CARPAL TUNNEL RELEASE  When is this surgery scheduled? TBD  What type of clearance is required (medical clearance vs. Pharmacy clearance to hold med vs. Both)? MEDICAL  Are there any medications that need to be held prior to surgery and how long? NONE LISTED  Practice name and name of physician performing surgery? [facility]  ATTN:VANESSA  What is the office phone number? [phone]   7.   What is the office fax number? [phone]  8.   Anesthesia type (None, local, MAC, general) ? GENERAL

## 2021-11-29 NOTE — Telephone Encounter (Signed)
Primary Cardiologist:Kardie Tobb, DO  Chart reviewed as part of pre-operative protocol coverage. Because of ACEN CRAUN past medical history and time since last visit, he/she will require a follow-up visit in order to better assess preoperative cardiovascular risk.  Pre-op covering staff: - Please schedule appointment and call patient to inform them. - Please contact requesting surgeon's office via preferred method (i.e, phone, fax) to inform them of need for appointment prior to surgery.  If applicable, this message will also be routed to pharmacy pool and/or primary cardiologist for input on holding anticoagulant/antiplatelet agent as requested below so that this information is available at time of patient's appointment.   Deberah Pelton, NP  11/29/2021, 2:22 PM

## 2021-12-02 ENCOUNTER — Encounter: Payer: Self-pay | Admitting: Family Medicine

## 2021-12-02 ENCOUNTER — Other Ambulatory Visit (HOSPITAL_BASED_OUTPATIENT_CLINIC_OR_DEPARTMENT_OTHER): Payer: Self-pay

## 2021-12-02 ENCOUNTER — Other Ambulatory Visit: Payer: Self-pay | Admitting: Gastroenterology

## 2021-12-02 ENCOUNTER — Ambulatory Visit (INDEPENDENT_AMBULATORY_CARE_PROVIDER_SITE_OTHER): Payer: Medicare HMO | Admitting: Family Medicine

## 2021-12-02 VITALS — BP 122/60 | HR 63 | Temp 97.9°F | Resp 18 | Ht 71.0 in | Wt 189.2 lb

## 2021-12-02 DIAGNOSIS — K219 Gastro-esophageal reflux disease without esophagitis: Secondary | ICD-10-CM | POA: Diagnosis not present

## 2021-12-02 DIAGNOSIS — R110 Nausea: Secondary | ICD-10-CM

## 2021-12-02 DIAGNOSIS — E1165 Type 2 diabetes mellitus with hyperglycemia: Secondary | ICD-10-CM

## 2021-12-02 DIAGNOSIS — I10 Essential (primary) hypertension: Secondary | ICD-10-CM

## 2021-12-02 DIAGNOSIS — E1169 Type 2 diabetes mellitus with other specified complication: Secondary | ICD-10-CM | POA: Diagnosis not present

## 2021-12-02 DIAGNOSIS — R079 Chest pain, unspecified: Secondary | ICD-10-CM

## 2021-12-02 DIAGNOSIS — E785 Hyperlipidemia, unspecified: Secondary | ICD-10-CM | POA: Diagnosis not present

## 2021-12-02 LAB — CBC WITH DIFFERENTIAL/PLATELET
Basophils Absolute: 0 10*3/uL (ref 0.0–0.1)
Basophils Relative: 0.5 % (ref 0.0–3.0)
Eosinophils Absolute: 0.1 10*3/uL (ref 0.0–0.7)
Eosinophils Relative: 2.5 % (ref 0.0–5.0)
HCT: 39.8 % (ref 39.0–52.0)
Hemoglobin: 13.4 g/dL (ref 13.0–17.0)
Lymphocytes Relative: 30.8 % (ref 12.0–46.0)
Lymphs Abs: 1.7 10*3/uL (ref 0.7–4.0)
MCHC: 33.6 g/dL (ref 30.0–36.0)
MCV: 86.5 fl (ref 78.0–100.0)
Monocytes Absolute: 0.4 10*3/uL (ref 0.1–1.0)
Monocytes Relative: 8 % (ref 3.0–12.0)
Neutro Abs: 3.2 10*3/uL (ref 1.4–7.7)
Neutrophils Relative %: 58.2 % (ref 43.0–77.0)
Platelets: 187 10*3/uL (ref 150.0–400.0)
RBC: 4.61 Mil/uL (ref 4.22–5.81)
RDW: 13.4 % (ref 11.5–15.5)
WBC: 5.5 10*3/uL (ref 4.0–10.5)

## 2021-12-02 LAB — COMPREHENSIVE METABOLIC PANEL
ALT: 12 U/L (ref 0–53)
AST: 16 U/L (ref 0–37)
Albumin: 4.7 g/dL (ref 3.5–5.2)
Alkaline Phosphatase: 82 U/L (ref 39–117)
BUN: 17 mg/dL (ref 6–23)
CO2: 29 mEq/L (ref 19–32)
Calcium: 10.1 mg/dL (ref 8.4–10.5)
Chloride: 101 mEq/L (ref 96–112)
Creatinine, Ser: 0.95 mg/dL (ref 0.40–1.50)
GFR: 83.45 mL/min (ref >60.00)
Glucose, Bld: 157 mg/dL — ABNORMAL HIGH (ref 70–99)
Potassium: 4.8 mEq/L (ref 3.5–5.1)
Sodium: 137 mEq/L (ref 135–145)
Total Bilirubin: 0.6 mg/dL (ref 0.2–1.2)
Total Protein: 7.4 g/dL (ref 6.0–8.3)

## 2021-12-02 LAB — LIPID PANEL
Cholesterol: 105 mg/dL (ref 0–200)
HDL: 46.4 mg/dL (ref >39.00)
LDL Cholesterol: 36 mg/dL (ref 0–99)
NonHDL: 58.44
Total CHOL/HDL Ratio: 2
Triglycerides: 111 mg/dL (ref 0.0–149.0)
VLDL: 22.2 mg/dL (ref 0.0–40.0)

## 2021-12-02 LAB — HEMOGLOBIN A1C: Hgb A1c MFr Bld: 6.3 % (ref 4.6–6.5)

## 2021-12-02 MED ORDER — PANTOPRAZOLE SODIUM 40 MG PO TBEC
DELAYED_RELEASE_TABLET | ORAL | 3 refills | Status: DC
  Filled 2021-12-02: qty 60, 30d supply, fill #0
  Filled 2021-12-02: qty 60, fill #0

## 2021-12-02 MED FILL — Pantoprazole Sodium EC Tab 40 MG (Base Equiv): ORAL | 30 days supply | Qty: 30 | Fill #0 | Status: CN

## 2021-12-02 NOTE — Telephone Encounter (Signed)
Ronald Petersen.  Our fax number is [phone] and the office phone is [phone].  Appreciate your help!

## 2021-12-02 NOTE — Assessment & Plan Note (Signed)
hgba1c to be checked, minimize simple carbs. Increase exercise as tolerated. Continue current meds  

## 2021-12-02 NOTE — Assessment & Plan Note (Signed)
Tolerating statin, encouraged heart healthy diet, avoid trans fats, minimize simple carbs and saturated fats. Increase exercise as tolerated 

## 2021-12-02 NOTE — Telephone Encounter (Signed)
Pt has appt 12/09/21 with Dr. Harriet Masson for pre op clearance. I will update the requesting office pt called our office and has appt 12/09/21. I will forward notes to Dr. Harriet Masson for upcoming appt.

## 2021-12-02 NOTE — Assessment & Plan Note (Signed)
Refilled protonix bid  F/u gi if no relief

## 2021-12-02 NOTE — Telephone Encounter (Signed)
I tried to reach the pt to advise that he is going to need an appt for pre op clearance. No answer and no vm came on to leave a message. Pt primary card is Berniece Salines, DO, who used to see the pt in the [facility] location. However [employer], DO is now in our [place] office. If pt ok to see pt in [place] then we can make appt with Kardie Tobb, DO. If pt wants to stay in the [facility] office then pt would need to est with one of the other cardiologist in [facility].   I will update the requesting office that pt will need an appt. We tried to contact the pt today, though no answer. In hopes if the requesting office s/w the pt, that they may let him know to call his cardiologist office for appt for pre op clearance.

## 2021-12-02 NOTE — Patient Instructions (Signed)
Nonspecific Chest Pain, Adult °Chest pain is an uncomfortable, tight, or painful feeling in the chest. The pain can feel like a crushing, aching, or squeezing pressure. A person can feel a burning or tingling sensation. Chest pain can also be felt in your back, neck, jaw, shoulder, or arm. This pain can be worse when you move, sneeze, or take a deep breath. °Chest pain can be caused by a condition that is life-threatening. This must be treated right away. It can also be caused by something that is not life-threatening. If you have chest pain, it can be hard to know the difference, so it is important to get help right away to make sure that you do not have a serious condition. °Some life-threatening causes of chest pain include: °Heart attack. °A tear in the body's main blood vessel (aortic dissection). °Inflammation around your heart (pericarditis). °A problem in the lungs, such as a blood clot (pulmonary embolism) or a collapsed lung (pneumothorax). °Some non life-threatening causes of chest pain include: °Heartburn. °Anxiety or stress. °Damage to the bones, muscles, and cartilage that make up your chest wall. °Pneumonia or bronchitis. °Shingles infection (varicella-zoster virus). °Your chest pain may come and go. It may also be constant. Your health care provider will do tests and other studies to find the cause of your pain. Treatment will depend on the cause of your chest pain. °Follow these instructions at home: °Medicines °Take over-the-counter and prescription medicines only as told by your health care provider. °If you were prescribed an antibiotic medicine, take it as told by your health care provider. Do not stop taking the antibiotic even if you start to feel better. °Activity °Avoid any activities that cause chest pain. °Do not lift anything that is heavier than 10 lb (4.5 kg), or the limit that you are told, until your health care provider says that it is safe. °Rest as directed by your health care  provider. °Return to your normal activities only as told by your health care provider. Ask your health care provider what activities are safe for you. °Lifestyle °  °Do not use any products that contain nicotine or tobacco, such as cigarettes, e-cigarettes, and chewing tobacco. If you need help quitting, ask your health care provider. °Do not drink alcohol. °Make healthy lifestyle changes as recommended. These may include: °Getting regular exercise. Ask your health care provider to suggest some exercises that are safe for you. °Eating a heart-healthy diet. This includes plenty of fresh fruits and vegetables, whole grains, low-fat (lean) protein, and low-fat dairy products. A dietitian can help you find healthy eating options. °Maintaining a healthy weight. °Managing any other health conditions you may have, such as high blood pressure (hypertension) or diabetes. °Reducing stress, such as with yoga or relaxation techniques. °General instructions °Pay attention to any changes in your symptoms. °It is up to you to get the results of any tests that were done. Ask your health care provider, or the department that is doing the tests, when your results will be ready. °Keep all follow-up visits as told by your health care provider. This is important. °You may be asked to go for further testing if your chest pain does not go away. °Contact a health care provider if: °Your chest pain does not go away. °You feel depressed. °You have a fever. °You notice changes in your symptoms or develop new symptoms. °Get help right away if: °Your chest pain gets worse. °You have a cough that gets worse, or you cough up   blood. °You have severe pain in your abdomen. °You faint. °You have sudden, unexplained chest discomfort. °You have sudden, unexplained discomfort in your arms, back, neck, or jaw. °You have shortness of breath at any time. °You suddenly start to sweat, or your skin gets clammy. °You feel nausea or you vomit. °You suddenly  feel lightheaded or dizzy. °You have severe weakness, or unexplained weakness or fatigue. °Your heart begins to beat quickly, or it feels like it is skipping beats. °These symptoms may represent a serious problem that is an emergency. Do not wait to see if the symptoms will go away. Get medical help right away. Call your local emergency services (911 in the [place] Do not drive yourself to the hospital. °Summary °Chest pain can be caused by a condition that is serious and requires urgent treatment. It may also be caused by something that is not life-threatening. °Your health care provider may do lab tests and other studies to find the cause of your pain. °Follow your health care provider's instructions on taking medicines, making lifestyle changes, and getting emergency treatment if symptoms become worse. °Keep all follow-up visits as told by your health care provider. This includes visits for any further testing if your chest pain does not go away. °This information is not intended to replace advice given to you by your health care provider. Make sure you discuss any questions you have with your health care provider. °Document Revised: 02/28/2021 Document Reviewed: 02/28/2021 °Elsevier Patient Education © 2022 [employer] ° °

## 2021-12-02 NOTE — Telephone Encounter (Signed)
Per message, patient has appointment with cardiology.  It appears from clearance information, this is being requested by [facility].

## 2021-12-02 NOTE — Assessment & Plan Note (Signed)
With exertion ---- check labs  ekg no change If occurs again go to ER Message left with Cardiology

## 2021-12-02 NOTE — Telephone Encounter (Signed)
Good evening Dr. Lorin Mercy. I saw your message to be sure that you are the only surgeon getting the clearance notes. What is a good fax # and Ph# so that we are sure to make sure the get notes faxed correctly.

## 2021-12-02 NOTE — Assessment & Plan Note (Signed)
Well controlled, no changes to meds. Encouraged heart healthy diet such as the DASH diet and exercise as tolerated.  °

## 2021-12-02 NOTE — Progress Notes (Signed)
Established Patient Office Visit  Subjective:  Patient ID: Ronald Petersen, male    DOB: 11/02/1956  Age: 65 y.o. MRN: [id number]  CC:  Chief Complaint  Patient presents with   Diabetes   Follow-up    HPI Ronald Petersen presents for f/u diabetes, bp and cholesterol.  He then stated he's been having chest pain with exertion.    The last time this occurred was yesterday.  He also has had worsening gerd and was supposed to inc his protonix to bid but ran out before he could do this --- he had a call into GI to have it refilled    pt also made an app with cardiology this am for dec 12 but did not tell them he was having chest pain.    Past Medical History:  Diagnosis Date   Anxiety    Arthritis    Cancer ([place])    Complication of anesthesia    aspirated with back surgery at age 76   Depression    Diabetes mellitus Type II    GERD (gastroesophageal reflux disease)    Hyperlipidemia    Hypertension    Neuromuscular disorder ([place])    NEUROPATHY   Right rotator cuff tear 11/23/2018   Sleep apnea    no CPAP    Past Surgical History:  Procedure Laterality Date   APPENDECTOMY     ARTHOSCOPIC ROTAOR CUFF REPAIR Right 11/23/2018   Procedure: ARTHROSCOPIC ROTATOR CUFF REPAIR;  Surgeon: [name], MD;  Location: [facility];  Service: Orthopedics;  Laterality: Right;   CHOLECYSTECTOMY     CORONARY STENT INTERVENTION N/A 11/19/2020   Procedure: CORONARY STENT INTERVENTION;  Surgeon: Nelva Bush, MD;  Location: [facility];  Service: Cardiovascular;  Laterality: N/A;   INTRAVASCULAR ULTRASOUND/IVUS N/A 11/19/2020   Procedure: Intravascular Ultrasound/IVUS;  Surgeon: Nelva Bush, MD;  Location: [facility];  Service: Cardiovascular;  Laterality: N/A;   LEFT HEART CATH AND CORONARY ANGIOGRAPHY N/A 11/19/2020   Procedure: LEFT HEART CATH AND CORONARY ANGIOGRAPHY;  Surgeon: Nelva Bush, MD;  Location: [facility];  Service:  Cardiovascular;  Laterality: N/A;   LEFT HEART CATH AND CORONARY ANGIOGRAPHY N/A 12/24/2020   Procedure: LEFT HEART CATH AND CORONARY ANGIOGRAPHY;  Surgeon: Martinique, Peter M, MD;  Location: [facility];  Service: Cardiovascular;  Laterality: N/A;   LUMBAR LAMINECTOMY     SHOULDER ARTHROSCOPY WITH ROTATOR CUFF REPAIR AND SUBACROMIAL DECOMPRESSION Right 11/23/2018   Procedure: SHOULDER ARTHROSCOPY WITH ROTATOR CUFF REPAIR AND SUBACROMIAL DECOMPRESSION;  Surgeon: [name], MD;  Location: [facility];  Service: Orthopedics;  Laterality: Right;    Family History  Problem Relation Age of Onset   COPD Mother    Stroke Father    Ovarian cancer Sister    Stomach cancer Sister    Heart disease Sister        MI   Heart disease Sister        MI   Stomach cancer Maternal Grandmother    Alcohol abuse Other    Depression Other    Arthritis Other    Hypertension Other    Coronary artery disease Other    Ovarian cancer Other        neice   Ovarian cancer Other        neice   Colon polyps Neg Hx    Colon cancer Neg Hx     Social History   Socioeconomic History  Marital status: Married    Spouse name: Lorriane Shire   Number of children: 2   Years of education: Not on file   Highest education level: Not on file  Occupational History   Occupation: self employed    Employer: UNEMPLOYED  Tobacco Use   Smoking status: Former    Years: 16.00    Types: Cigarettes    Quit date: 12/29/1985    Years since quitting: 35.9   Smokeless tobacco: Never  Vaping Use   Vaping Use: Never used  Substance and Sexual Activity   Alcohol use: Yes    Alcohol/week: 4.0 - 5.0 standard drinks    Types: 4 - 5 Cans of beer per week    Comment: 5 times per week   Drug use: No   Sexual activity: Yes    Partners: Female  Other Topics Concern   Not on file  Social History Narrative   Exercise-- 3 days   Pt has hs degree   Right handed   One story home   Drinks caffeine   Social  Determinants of Health   Financial Resource Strain: Not on file  Food Insecurity: Not on file  Transportation Needs: Not on file  Physical Activity: Not on file  Stress: Not on file  Social Connections: Not on file  Intimate Partner Violence: Not on file    Outpatient Medications Prior to Visit  Medication Sig Dispense Refill   aspirin EC 81 MG tablet Take 1 tablet (81 mg total) by mouth daily. Swallow whole. 90 tablet 3   Continuous Blood Gluc Receiver (FREESTYLE LIBRE 2 READER) DEVI Use as directed to check blood sugar 1 each 0   Continuous Blood Gluc Sensor (FREESTYLE LIBRE 2 SENSOR) MISC Use to check blood sugar, change every 14 days 2 each 6   dicyclomine (BENTYL) 10 MG capsule Take 1 capsule (10 mg total) by mouth every 8 (eight) hours as needed for spasms. 30 capsule 1   fenofibrate 160 MG tablet TAKE 1 TABLET (160 MG TOTAL) BY MOUTH DAILY. 90 tablet 1   furosemide (LASIX) 40 MG tablet TAKE 1 TABLET (40 MG TOTAL) BY MOUTH EVERY OTHER DAY. 90 tablet 3   gabapentin (NEURONTIN) 800 MG tablet Take 1 tablet (800 mg total) by mouth 3 (three) times daily. 270 tablet 2   glucose blood test strip USE AS INSTRUCTED TO CHECK BLOOD SUGAR ONCE A DAY (Patient taking differently: USE AS INSTRUCTED TO CHECK BLOOD SUGAR ONCE A DAY) 100 strip 12   lisinopril (ZESTRIL) 5 MG tablet TAKE 1 TABLET (5 MG TOTAL) BY MOUTH DAILY. 100 tablet 0   metFORMIN (GLUCOPHAGE-XR) 500 MG 24 hr tablet Take 1 tablet (500 mg total) by mouth daily with breakfast. 90 tablet 3   ondansetron (ZOFRAN) 4 MG tablet Take 1 tablet (4 mg total) by mouth every 8 (eight) hours as needed. 20 tablet 2   oxyCODONE-acetaminophen (PERCOCET/ROXICET) 5-325 MG tablet TAKE 1 TABLET BY MOUTH EVERY 4 (FOUR) HOURS AS NEEDED FOR SEVERE PAIN. 120 tablet 0   polyethylene glycol powder (GLYCOLAX/MIRALAX) 17 GM/SCOOP powder Take 17 g by mouth daily. Increase to twice a day as needed 510 g 1   potassium chloride SA (KLOR-CON) 20 MEQ tablet TAKE 1  TABLET (20 MEQ TOTAL) BY MOUTH EVERY OTHER DAY. 90 tablet 3   ranolazine (RANEXA) 1000 MG SR tablet TAKE 1 TABLET (1,000 MG TOTAL) BY MOUTH 2 (TWO) TIMES DAILY. 60 tablet 3   rosuvastatin (CRESTOR) 40 MG tablet TAKE 1 TABLET  BY MOUTH ONCE DAILY 90 tablet 3   sertraline (ZOLOFT) 100 MG tablet TAKE ONE AND ONE-HALF TABLET BY MOUTH ONCE DAILY 135 tablet 3   ticagrelor (BRILINTA) 90 MG TABS tablet TAKE 1 TABLET (90 MG TOTAL) BY MOUTH TWO TIMES DAILY. 180 tablet 1   tiZANidine (ZANAFLEX) 4 MG tablet TAKE 1 TABLET (4 MG TOTAL) BY MOUTH EVERY 6 (SIX) HOURS AS NEEDED FOR MUSCLE SPASMS. 30 tablet 1   traZODone (DESYREL) 50 MG tablet TAKE 1/2-1 TABLET (25-50 MG TOTAL) BY MOUTH AT BEDTIME AS NEEDED FOR SLEEP. 30 tablet 3   pantoprazole (PROTONIX) 40 MG tablet TAKE 1 TABLET (40 MG TOTAL) BY MOUTH DAILY. 30 tablet 3   nitroGLYCERIN (NITROSTAT) 0.4 MG SL tablet PLACE 1 TABLET (0.4 MG TOTAL) UNDER THE TONGUE EVERY 5 (FIVE) MINUTES AS NEEDED FOR CHEST PAIN. (Patient not taking: Reported on 10/30/2021) 90 tablet 3   No facility-administered medications prior to visit.    Allergies  Allergen Reactions   Niacin Anaphylaxis    ROS Review of Systems  Constitutional:  Negative for appetite change, diaphoresis, fatigue and unexpected weight change.  Eyes:  Negative for pain, redness and visual disturbance.  Respiratory:  Negative for cough, chest tightness, shortness of breath and wheezing.   Cardiovascular:  Positive for chest pain. Negative for palpitations and leg swelling.  Gastrointestinal:  Positive for abdominal pain. Negative for nausea.  Endocrine: Negative for cold intolerance, heat intolerance, polydipsia, polyphagia and polyuria.  Genitourinary:  Negative for difficulty urinating, dysuria and frequency.  Neurological:  Negative for dizziness, light-headedness, numbness and headaches.  Psychiatric/Behavioral:  Negative for decreased concentration, dysphoric mood, self-injury, sleep disturbance and  suicidal ideas. The patient is not nervous/anxious.      Objective:    Physical Exam Vitals and nursing note reviewed.  Constitutional:      Appearance: He is well-developed.  HENT:     Head: Normocephalic and atraumatic.  Eyes:     Pupils: Pupils are equal, round, and reactive to light.  Neck:     Thyroid: No thyromegaly.  Cardiovascular:     Rate and Rhythm: Normal rate and regular rhythm.     Heart sounds: No murmur heard. Pulmonary:     Effort: Pulmonary effort is normal. No respiratory distress.     Breath sounds: Normal breath sounds. No wheezing or rales.  Chest:     Chest wall: No tenderness.  Musculoskeletal:        General: No tenderness.     Cervical back: Normal range of motion and neck supple.  Skin:    General: Skin is warm and dry.  Neurological:     Mental Status: He is alert and oriented to person, place, and time.  Psychiatric:        Behavior: Behavior normal.        Thought Content: Thought content normal.        Judgment: Judgment normal.  Ekg-- no change from previous ekg 5/2   sinus rhythm  BP 122/60 (BP Location: Left Arm, Patient Position: Sitting, Cuff Size: Normal)   Pulse 63   Temp 97.9 F (36.6 C) (Oral)   Resp 18   Ht 5' 11"  (1.803 m)   Wt 189 lb 3.2 oz (85.8 kg)   SpO2 99%   BMI 26.39 kg/m  Wt Readings from Last 3 Encounters:  12/02/21 189 lb 3.2 oz (85.8 kg)  11/05/21 185 lb (83.9 kg)  10/30/21 190 lb 2 oz (86.2 kg)     Health  Maintenance Due  Topic Date Due   COVID-19 Vaccine (1) Never done   Pneumonia Vaccine 38+ Years old (1 - PCV) Never done   Zoster Vaccines- Shingrix (1 of 2) Never done   OPHTHALMOLOGY EXAM  02/18/2018   FOOT EXAM  05/31/2021   INFLUENZA VACCINE  07/29/2021    There are no preventive care reminders to display for this patient.  Lab Results  Component Value Date   TSH 1.82 02/26/2021   Lab Results  Component Value Date   WBC 6.5 04/25/2021   HGB 13.9 04/25/2021   HCT 40.9 04/25/2021   MCV  85.0 04/25/2021   PLT 197 04/25/2021   Lab Results  Component Value Date   NA 139 06/10/2021   K 4.7 06/10/2021   CO2 25 06/10/2021   GLUCOSE 119 (H) 06/10/2021   BUN 18 06/10/2021   CREATININE 0.93 06/10/2021   BILITOT 0.4 04/25/2021   ALKPHOS 47 04/25/2021   AST 20 04/25/2021   ALT 16 04/25/2021   PROT 7.6 04/25/2021   ALBUMIN 4.4 04/25/2021   CALCIUM 9.7 06/10/2021   ANIONGAP 10 04/25/2021   EGFR 91 06/10/2021   GFR 73.55 03/07/2021   Lab Results  Component Value Date   CHOL 87 03/07/2021   Lab Results  Component Value Date   HDL 32.30 (L) 03/07/2021   Lab Results  Component Value Date   LDLCALC 29 03/07/2021   Lab Results  Component Value Date   TRIG 127.0 03/07/2021   Lab Results  Component Value Date   CHOLHDL 3 03/07/2021   Lab Results  Component Value Date   HGBA1C 5.4 07/30/2021      Assessment & Plan:   Problem List Items Addressed This Visit       Unprioritized   Hyperlipidemia associated with type 2 diabetes mellitus ([place]) (Chronic)    Tolerating statin, encouraged heart healthy diet, avoid trans fats, minimize simple carbs and saturated fats. Increase exercise as tolerated      Relevant Orders   CBC with Differential/Platelet   Hemoglobin A1c   Lipid panel   Comprehensive metabolic panel   Hyperlipidemia LDL goal <70 (Chronic)    Tolerating statin, encouraged heart healthy diet, avoid trans fats, minimize simple carbs and saturated fats. Increase exercise as tolerated      Gastroesophageal reflux disease   Relevant Medications   pantoprazole (PROTONIX) 40 MG tablet   Hypertension   Relevant Orders   CBC with Differential/Platelet   Hemoglobin A1c   Lipid panel   Comprehensive metabolic panel   Chest pain    With exertion ---- check labs  ekg no change If occurs again go to ER Message left with Cardiology      Relevant Orders   EKG 12-Lead (Completed)   Troponin I (High Sensitivity)   Diabetes mellitus, type II ([place]) -  Primary    hgba1c to be checked , minimize simple carbs. Increase exercise as tolerated. Continue current meds       Relevant Orders   CBC with Differential/Platelet   Hemoglobin A1c   Lipid panel   Comprehensive metabolic panel   Other Visit Diagnoses     Nausea       Relevant Medications   pantoprazole (PROTONIX) 40 MG tablet       Meds ordered this encounter  Medications   pantoprazole (PROTONIX) 40 MG tablet    Sig: TAKE 1 TABLET (40 MG TOTAL) BY MOUTH TWICE A DAY    Dispense:  60 tablet  Refill:  3    Follow-up: Return in about 6 months (around 06/02/2022), or if symptoms worsen or fail to improve, for hypertension, hyperlipidemia, diabetes II, annual exam, fasting.    [name], DO

## 2021-12-03 NOTE — Telephone Encounter (Signed)
I will update Kardie Tobb, DO with the correct ph and fax# to use when she faxes over her ov note with clearance recommendations. fax number is [phone] and the office phone is [phone]

## 2021-12-04 ENCOUNTER — Telehealth: Payer: Self-pay | Admitting: Cardiology

## 2021-12-04 ENCOUNTER — Other Ambulatory Visit (HOSPITAL_BASED_OUTPATIENT_CLINIC_OR_DEPARTMENT_OTHER): Payer: Self-pay

## 2021-12-04 NOTE — Telephone Encounter (Signed)
Pt c/o of Chest Pain: STAT if CP now or developed within 24 hours  1. Are you having CP right now? Hurting in his chest, feels like indigestion sometimes it runs through his back- when he exerts himself he really feels it  2. Are you experiencing any other symptoms (ex. SOB, nausea, vomiting, sweating)? Headache, short of breath when he feels that way  3. How long have you been experiencing CP? About 2 weeks ago  4. Is your CP continuous or coming and going? Comes and goes  5. Have you taken Nitroglycerin? yes ?

## 2021-12-04 NOTE — Telephone Encounter (Signed)
Received call into triage, he states that he has been having coming and going chest pains for the last 2 weeks or so. He states it feels like indigestion pain and he has been belching a lot (however this does not improve his symptoms). He states he does have a headache at times, and his chest pain occurs when he is doing activities, lifting or walking around. He does mention being SOB. He states it can be in his back, around his shoulder blades. He denies the pain currently, but I did advise if this occurred again he should go be evaluated due to his history.  I would send a message and send to MD to make aware.  Upcoming appointment 12/12 with Dr.Tobb.   Thank you!

## 2021-12-09 ENCOUNTER — Other Ambulatory Visit (HOSPITAL_BASED_OUTPATIENT_CLINIC_OR_DEPARTMENT_OTHER): Payer: Self-pay

## 2021-12-09 ENCOUNTER — Other Ambulatory Visit: Payer: Self-pay

## 2021-12-09 ENCOUNTER — Encounter: Payer: Self-pay | Admitting: Cardiology

## 2021-12-09 ENCOUNTER — Ambulatory Visit (INDEPENDENT_AMBULATORY_CARE_PROVIDER_SITE_OTHER): Payer: Medicare HMO | Admitting: Cardiology

## 2021-12-09 VITALS — BP 158/76 | HR 52 | Ht 71.0 in | Wt 186.0 lb

## 2021-12-09 DIAGNOSIS — Z79899 Other long term (current) drug therapy: Secondary | ICD-10-CM | POA: Diagnosis not present

## 2021-12-09 DIAGNOSIS — E1140 Type 2 diabetes mellitus with diabetic neuropathy, unspecified: Secondary | ICD-10-CM

## 2021-12-09 DIAGNOSIS — I10 Essential (primary) hypertension: Secondary | ICD-10-CM

## 2021-12-09 DIAGNOSIS — I25118 Atherosclerotic heart disease of native coronary artery with other forms of angina pectoris: Secondary | ICD-10-CM | POA: Diagnosis not present

## 2021-12-09 DIAGNOSIS — E785 Hyperlipidemia, unspecified: Secondary | ICD-10-CM

## 2021-12-09 LAB — MAGNESIUM: Magnesium: 2 mg/dL (ref 1.6–2.3)

## 2021-12-09 LAB — CBC WITH DIFFERENTIAL/PLATELET
Basophils Absolute: 0 10*3/uL (ref 0.0–0.2)
Basos: 1 %
EOS (ABSOLUTE): 0.1 10*3/uL (ref 0.0–0.4)
Eos: 2 %
Hematocrit: 39.3 % (ref 37.5–51.0)
Hemoglobin: 13.5 g/dL (ref 13.0–17.7)
Immature Grans (Abs): 0 10*3/uL (ref 0.0–0.1)
Immature Granulocytes: 0 %
Lymphocytes Absolute: 1.5 10*3/uL (ref 0.7–3.1)
Lymphs: 31 %
MCH: 29.2 pg (ref 26.6–33.0)
MCHC: 34.4 g/dL (ref 31.5–35.7)
MCV: 85 fL (ref 79–97)
Monocytes Absolute: 0.5 10*3/uL (ref 0.1–0.9)
Monocytes: 10 %
Neutrophils Absolute: 2.8 10*3/uL (ref 1.4–7.0)
Neutrophils: 56 %
Platelets: 176 10*3/uL (ref 150–450)
RBC: 4.62 x10E6/uL (ref 4.14–5.80)
RDW: 12.1 % (ref 11.6–15.4)
WBC: 4.9 10*3/uL (ref 3.4–10.8)

## 2021-12-09 LAB — BASIC METABOLIC PANEL
BUN/Creatinine Ratio: 17 (ref 10–24)
BUN: 16 mg/dL (ref 8–27)
CO2: 25 mmol/L (ref 20–29)
Calcium: 9.9 mg/dL (ref 8.6–10.2)
Chloride: 102 mmol/L (ref 96–106)
Creatinine, Ser: 0.95 mg/dL (ref 0.76–1.27)
Glucose: 136 mg/dL — ABNORMAL HIGH (ref 70–99)
Potassium: 4.3 mmol/L (ref 3.5–5.2)
Sodium: 139 mmol/L (ref 134–144)
eGFR: 88 mL/min/{1.73_m2} (ref >59)

## 2021-12-09 MED ORDER — TICAGRELOR 90 MG PO TABS: 90.0000 mg | ORAL_TABLET | Freq: Two times a day (BID) | ORAL | 0 refills | Status: DC

## 2021-12-09 MED ORDER — ISOSORBIDE MONONITRATE ER 60 MG PO TB24
60.0000 mg | ORAL_TABLET | Freq: Every day | ORAL | 3 refills | Status: DC
  Filled 2021-12-09: qty 90, 90d supply, fill #0

## 2021-12-09 MED ORDER — AMLODIPINE BESYLATE 2.5 MG PO TABS
2.5000 mg | ORAL_TABLET | Freq: Every day | ORAL | 3 refills | Status: DC
  Filled 2021-12-09: qty 90, 90d supply, fill #0

## 2021-12-09 NOTE — H&P (View-Only) (Signed)
Cardiology Office Note:    Date:  12/12/2021   ID:  Ronald Petersen, DOB 1956-08-03, MRN [id number]  PCP:  [name], Alferd Apa, DO  Cardiologist:  Berniece Salines, DO  Electrophysiologist:  None   Referring MD: [name], Alferd Apa, *   " I am having chest pain"   History of Present Illness:    Ronald Petersen is a 65 y.o. 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   In December he was seen at the emergency department [facility] he subsequently underwent a left heart catheterization given the fact that he had had intermittent worsening chest pain.    I saw the patient on December 30, 2018 at that time he was willing to get back on the Imdur but he tells me he started when he started to feel tired in lots of headache so he had stopped this medication.  He continue his Ranexa.   I saw the patient on March 05, 2021 at that time he had been experiencing shortness of breath.  I started him on Lasix 40 mg every other day to see if this is going to help.  Said I saw him his sleep study reported obstructive sleep apnea and was pending CPAP titration.   Past Medical History:  Diagnosis Date   Anxiety    Arthritis    Cancer ([place])    Complication of anesthesia    aspirated with back surgery at age 78   Depression    Diabetes mellitus Type II    GERD (gastroesophageal reflux disease)    Hyperlipidemia    Hypertension    Neuromuscular disorder ([place])    NEUROPATHY   Right rotator cuff tear 11/23/2018   Sleep apnea    no CPAP    Past Surgical History:  Procedure Laterality Date   APPENDECTOMY     ARTHOSCOPIC ROTAOR  CUFF REPAIR Right 11/23/2018   Procedure: ARTHROSCOPIC ROTATOR CUFF REPAIR;  Surgeon: [name], MD;  Location: [facility];  Service: Orthopedics;  Laterality: Right;   CHOLECYSTECTOMY     CORONARY STENT INTERVENTION N/A 11/19/2020   Procedure: CORONARY STENT INTERVENTION;  Surgeon: Nelva Bush, MD;  Location: [place] [facility];  Service: Cardiovascular;  Laterality: N/A;   INTRAVASCULAR ULTRASOUND/IVUS N/A 11/19/2020   Procedure: Intravascular Ultrasound/IVUS;  Surgeon: Nelva Bush, MD;  Location: [place] [facility];  Service: Cardiovascular;  Laterality: N/A;   LEFT HEART CATH AND CORONARY ANGIOGRAPHY N/A 11/19/2020   Procedure: LEFT HEART CATH AND CORONARY ANGIOGRAPHY;  Surgeon: Nelva Bush, MD;  Location: [place] [facility];  Service: Cardiovascular;  Laterality: N/A;   LEFT HEART CATH AND CORONARY ANGIOGRAPHY N/A 12/24/2020   Procedure: LEFT HEART CATH AND CORONARY ANGIOGRAPHY;  Surgeon: Martinique, Peter M, MD;  Location: [place];  Service: Cardiovascular;  Laterality: N/A;   LUMBAR LAMINECTOMY     SHOULDER ARTHROSCOPY WITH ROTATOR CUFF REPAIR AND SUBACROMIAL DECOMPRESSION Right 11/23/2018   Procedure: SHOULDER ARTHROSCOPY WITH ROTATOR CUFF REPAIR AND SUBACROMIAL DECOMPRESSION;  Surgeon: [name], MD;  Location: [facility];  Service: Orthopedics;  Laterality: Right;  Current Medications: Current Meds  Medication Sig   acetaminophen (TYLENOL) 650 MG CR tablet Take 1,300 mg by mouth every 8 (eight) hours as needed for pain.   amLODipine (NORVASC) 2.5 MG tablet Take 1 tablet (2.5 mg total) by mouth daily.   aspirin EC 81 MG tablet Take 1 tablet (81 mg total) by mouth daily. Swallow whole.   Continuous Blood Gluc Receiver (FREESTYLE LIBRE 2 READER) DEVI Use as directed to check blood sugar   Continuous Blood Gluc Sensor (FREESTYLE LIBRE 2 SENSOR) MISC Use to check blood sugar, change every 14 days   dicyclomine (BENTYL) 10 MG  capsule Take 1 capsule (10 mg total) by mouth every 8 (eight) hours as needed for spasms.   fenofibrate 160 MG tablet TAKE 1 TABLET (160 MG TOTAL) BY MOUTH DAILY.   furosemide (LASIX) 40 MG tablet TAKE 1 TABLET (40 MG TOTAL) BY MOUTH EVERY OTHER DAY.   gabapentin (NEURONTIN) 800 MG tablet Take 1 tablet (800 mg total) by mouth 3 (three) times daily.   glucose blood test strip USE AS INSTRUCTED TO CHECK BLOOD SUGAR ONCE A DAY   lisinopril (ZESTRIL) 5 MG tablet TAKE 1 TABLET (5 MG TOTAL) BY MOUTH DAILY.   metFORMIN (GLUCOPHAGE-XR) 500 MG 24 hr tablet Take 1 tablet (500 mg total) by mouth daily with breakfast.   ondansetron (ZOFRAN) 4 MG tablet Take 1 tablet (4 mg total) by mouth every 8 (eight) hours as needed.   pantoprazole (PROTONIX) 40 MG tablet TAKE 1 TABLET (40 MG TOTAL) BY MOUTH TWICE A DAY   polyethylene glycol powder (GLYCOLAX/MIRALAX) 17 GM/SCOOP powder Take 17 g by mouth daily. Increase to twice a day as needed (Patient taking differently: Take 17 g by mouth 2 (two) times daily as needed for moderate constipation.)   potassium chloride SA (KLOR-CON) 20 MEQ tablet TAKE 1 TABLET (20 MEQ TOTAL) BY MOUTH EVERY OTHER DAY.   ranolazine (RANEXA) 1000 MG SR tablet TAKE 1 TABLET (1,000 MG TOTAL) BY MOUTH 2 (TWO) TIMES DAILY.   rosuvastatin (CRESTOR) 40 MG tablet TAKE 1 TABLET BY MOUTH ONCE DAILY   sertraline (ZOLOFT) 100 MG tablet TAKE ONE AND ONE-HALF TABLET BY MOUTH ONCE DAILY   ticagrelor (BRILINTA) 90 MG TABS tablet TAKE 1 TABLET (90 MG TOTAL) BY MOUTH TWO TIMES DAILY. (Patient not taking: Reported on 12/11/2021)   ticagrelor (BRILINTA) 90 MG TABS tablet Take 1 tablet (90 mg total) by mouth 2 (two) times daily.   tiZANidine (ZANAFLEX) 4 MG tablet TAKE 1 TABLET (4 MG TOTAL) BY MOUTH EVERY 6 (SIX) HOURS AS NEEDED FOR MUSCLE SPASMS.   traZODone (DESYREL) 50 MG tablet TAKE 1/2-1 TABLET (25-50 MG TOTAL) BY MOUTH AT BEDTIME AS NEEDED FOR SLEEP. (Patient taking differently: Take 50 mg by mouth at  bedtime.)   [DISCONTINUED] isosorbide mononitrate (IMDUR) 60 MG 24 hr tablet Take 1 tablet (60 mg total) by mouth daily.     Allergies:   Niacin   Social History   Socioeconomic History   Marital status: Married    Spouse name: Lorriane Shire   Number of children: 2   Years of education: Not on file   Highest education level: Not on file  Occupational History   Occupation: self employed    Employer: UNEMPLOYED  Tobacco Use   Smoking status: Former    Years: 16.00    Types: Cigarettes    Quit date: 12/29/1985    Years since quitting: 35.9   Smokeless tobacco: Never  Vaping Use  Vaping Use: Never used  Substance and Sexual Activity   Alcohol use: Yes    Alcohol/week: 4.0 - 5.0 standard drinks    Types: 4 - 5 Cans of beer per week    Comment: 5 times per week   Drug use: No   Sexual activity: Yes    Partners: Female  Other Topics Concern   Not on file  Social History Narrative   Exercise-- 3 days   Pt has hs degree   Right handed   One story home   Drinks caffeine   Social Determinants of Health   Financial Resource Strain: Not on file  Food Insecurity: Not on file  Transportation Needs: Not on file  Physical Activity: Not on file  Stress: Not on file  Social Connections: Not on file     Family History: The patient's family history includes Alcohol abuse in an other family member; Arthritis in an other family member; COPD in his mother; Coronary artery disease in an other family member; Depression in an other family member; Heart disease in his sister and sister; Hypertension in an other family member; Ovarian cancer in his sister and other family members; Stomach cancer in his maternal grandmother and sister; Stroke in his father. There is no history of Colon polyps or Colon cancer.  ROS:   Review of Systems  Constitution: Negative for decreased appetite, fever and weight gain.  HENT: Negative for congestion, ear discharge, hoarse voice and sore throat.   Eyes:  Negative for discharge, redness, vision loss in right eye and visual halos.  Cardiovascular: Negative for chest pain, dyspnea on exertion, leg swelling, orthopnea and palpitations.  Respiratory: Negative for cough, hemoptysis, shortness of breath and snoring.   Endocrine: Negative for heat intolerance and polyphagia.  Hematologic/Lymphatic: Negative for bleeding problem. Does not bruise/bleed easily.  Skin: Negative for flushing, nail changes, rash and suspicious lesions.  Musculoskeletal: Negative for arthritis, joint pain, muscle cramps, myalgias, neck pain and stiffness.  Gastrointestinal: Negative for abdominal pain, bowel incontinence, diarrhea and excessive appetite.  Genitourinary: Negative for decreased libido, genital sores and incomplete emptying.  Neurological: Negative for brief paralysis, focal weakness, headaches and loss of balance.  Psychiatric/Behavioral: Negative for altered mental status, depression and suicidal ideas.  Allergic/Immunologic: Negative for HIV exposure and persistent infections.    EKGs/Labs/Other Studies Reviewed:    The following studies were reviewed today:   EKG:  The ekg ordered today demonstrates sinus rhythm, heart rate 61 bpm with evidence of Anterior fascicular block this was on December 5,2022    [place] 11/2021 1st Diag lesion is 25% stenosed. Previously placed Prox LAD to Mid LAD drug eluting stent is widely patent. Previously placed 1st Mrg drug eluting stent is widely patent. Previously placed Prox Cx drug-eluting stents are widely patent. RPAV lesion is 50% stenosed. LV end diastolic pressure is normal.   1. Nonobstructive CAD. Excellent patency of stents in the LAD and LCx. Lesion in the PL branch reduced from 70% to 50%. 2. Normal LV filling pressures. Plan: continue medical management. Anticipate DC today.  Recent Labs: 02/26/2021: TSH 1.82 12/02/2021: ALT 12 12/09/2021: BUN 16; Creatinine, Ser 0.95; Hemoglobin 13.5; Magnesium 2.0;  Platelets 176; Potassium 4.3; Sodium 139  Recent Lipid Panel    Component Value Date/Time   CHOL 105 12/02/2021 1508   TRIG 111.0 12/02/2021 1508   TRIG 148 12/10/2006 1603   HDL 46.40 12/02/2021 1508   CHOLHDL 2 12/02/2021 1508   VLDL 22.2 12/02/2021 1508   LDLCALC  36 12/02/2021 1508   LDLCALC 96 09/06/2020 1138   LDLDIRECT 93.0 05/31/2020 0910    Physical Exam:    VS:  BP (!) 158/76    Pulse (!) 52    Ht 5\' 11"  (1.803 m)    Wt 186 lb (84.4 kg)    SpO2 97%    BMI 25.94 kg/m     Wt Readings from Last 3 Encounters:  12/09/21 186 lb (84.4 kg)  12/02/21 189 lb 3.2 oz (85.8 kg)  11/05/21 185 lb (83.9 kg)     GEN: Well nourished, well developed in no acute distress HEENT: Normal NECK: No JVD; No carotid bruits LYMPHATICS: No lymphadenopathy CARDIAC: S1S2 noted,RRR, no murmurs, rubs, gallops RESPIRATORY:  Clear to auscultation without rales, wheezing or rhonchi  ABDOMEN: Soft, non-tender, non-distended, +bowel sounds, no guarding. EXTREMITIES: No edema, No cyanosis, no clubbing MUSCULOSKELETAL:  No deformity  SKIN: Warm and dry NEUROLOGIC:  Alert and oriented x 3, non-focal PSYCHIATRIC:  Normal affect, good insight  ASSESSMENT:    1. Coronary artery disease of native artery of native heart with stable angina pectoris ([place])   2. Type 2 diabetes mellitus with diabetic neuropathy, without long-term current use of insulin ([place])   3. Medication management   4. Primary hypertension   5. Hyperlipidemia LDL goal <70    PLAN:    He is experiencing chest discomfort I am concerned for progression of coronary artery disease given the fact that we have had the patient on antianginals and he has not responded to this.  I would like to proceed with a left heart catheterization in this patient.  He is agreeable for this. The patient understands that risks include but are not limited to stroke (1 in 1000), death (1 in 17), kidney failure [usually temporary] (1 in 500), bleeding (1 in  200), allergic reaction [possibly serious] (1 in 200), and agrees to proceed.   He is on Ranexa 1000 mg twice daily, has been unable to tolerate Imdur.  What I like to do is give him low-dose amlodipine for its antianginal effect-amlodipine 2.5 mg will be started.  Continue aspirin 81 mg daily with the Brilinta 90 mg twice daily.  Also continue patient on Crestor 40 mg daily.  He is also hypertensive in the office today.  Hopefully with the addition of the amlodipine will also help with his blood pressure as well.  I would prefer he undergo his left heart catheterization before proceeding with any surgical procedures.  The patient is in agreement with the above plan. The patient left the office in stable condition.  The patient will follow up in 2 weeks post cath.   Medication Adjustments/Labs and Tests Ordered: Current medicines are reviewed at length with the patient today.  Concerns regarding medicines are outlined above.  Orders Placed This Encounter  Procedures   Basic Metabolic Panel (BMET)   CBC with Differential/Platelet   Magnesium   Meds ordered this encounter  Medications   DISCONTD: isosorbide mononitrate (IMDUR) 60 MG 24 hr tablet    Sig: Take 1 tablet (60 mg total) by mouth daily.    Dispense:  90 tablet    Refill:  3   amLODipine (NORVASC) 2.5 MG tablet    Sig: Take 1 tablet (2.5 mg total) by mouth daily.    Dispense:  90 tablet    Refill:  3   ticagrelor (BRILINTA) 90 MG TABS tablet    Sig: Take 1 tablet (90 mg total) by mouth 2 (two)  times daily.    Dispense:  36 tablet    Refill:  0    Patient Instructions  Medication Instructions:  Your physician has recommended you make the following change in your medication:  STOP: Imdur START: Amlodipine 2.5 mg once daily  *If you need a refill on your cardiac medications before your next appointment, please call your pharmacy*   Lab Work: Your physician recommends that you return for lab work in:  TODAY: BMET,  Mag, CBC If you have labs (blood work) drawn today and your tests are completely normal, you will receive your results only by: MyChart Message (if you have MyChart) OR A paper copy in the mail If you have any lab test that is abnormal or we need to change your treatment, we will call you to review the results.   Testing/Procedures:  [address] [place] [address] Dept: [phone] Loc: [phone]  Ronald Petersen  12/09/2021  You are scheduled for a Cardiac Catheterization on Monday, December 19 with Dr. Glenetta Hew.  1. Please arrive at the [facility] (Main Entrance A) at [facility]: [address] [place], [place] [address] at 5:30 AM (This time is two hours before your procedure to ensure your preparation). Free valet parking service is available.   Special note: Every effort is made to have your procedure done on time. Please understand that emergencies sometimes delay scheduled procedures.  2. Diet: Do not eat solid foods after midnight.  The patient may have clear liquids until 5am upon the day of the procedure.  3. Labs: You will need to have blood drawn on TODAY.  4. Medication instructions in preparation for your procedure:   Contrast Allergy: No  Do not take Diabetes Med Glucophage (Metformin) on the day of the procedure and HOLD 48 HOURS AFTER THE PROCEDURE.  On the morning of your procedure, take your Aspirin and any morning medicines NOT listed above.  You may use sips of water.  5. Plan for one night stay--bring personal belongings. 6. Bring a current list of your medications and current insurance cards. 7. You MUST have a responsible person to drive you home. 8. Someone MUST be with you the first 24 hours after you arrive home or your discharge will be delayed. 9. Please wear clothes that are easy to get on and off and wear slip-on  shoes.  Thank you for allowing [place] to care for you!   -- [facility]    Follow-Up: At [facility], you and your health needs are our priority.  As part of our continuing mission to provide you with exceptional heart care, we have created designated Provider Care Teams.  These Care Teams include your primary Cardiologist (physician) and Advanced Practice Providers (APPs -  Physician Assistants and Nurse Practitioners) who all work together to provide you with the care you need, when you need it.  We recommend signing up for the patient portal called "MyChart".  Sign up information is provided on this After Visit Summary.  MyChart is used to connect with patients for Virtual Visits (Telemedicine).  Patients are able to view lab/test results, encounter notes, upcoming appointments, etc.  Non-urgent messages can be sent to your provider as well.   To learn more about what you can do with MyChart, go to [email or web address].    Your next appointment:   2 week(s) after Cath  The format for  your next appointment:   In Person  Provider:   Berniece Salines, DO     Other Instructions     Adopting a Healthy Lifestyle.  Know what a healthy weight is for you (roughly BMI <25) and aim to maintain this   Aim for 7+ servings of fruits and vegetables daily   65-80+ fluid ounces of water or unsweet tea for healthy kidneys   Limit to max 1 drink of alcohol per day; avoid smoking/tobacco   Limit animal fats in diet for cholesterol and heart health - choose grass fed whenever available   Avoid highly processed foods, and foods high in saturated/trans fats   Aim for low stress - take time to unwind and care for your mental health   Aim for 150 min of moderate intensity exercise weekly for heart health, and weights twice weekly for bone health   Aim for 7-9 hours of sleep daily   When it comes to diets, agreement about the perfect plan isnt easy to find, even  among the experts. Experts at the [employer] developed an idea known as the Healthy Eating Plate. Just imagine a plate divided into logical, healthy portions.   The emphasis is on diet quality:   Load up on vegetables and fruits - one-half of your plate: Aim for color and variety, and remember that potatoes dont count.   Go for whole grains - one-quarter of your plate: Whole wheat, barley, wheat berries, quinoa, oats, brown rice, and foods made with them. If you want pasta, go with whole wheat pasta.   Protein power - one-quarter of your plate: Fish, chicken, beans, and nuts are all healthy, versatile protein sources. Limit red meat.   The diet, however, does go beyond the plate, offering a few other suggestions.   Use healthy plant oils, such as olive, canola, soy, corn, sunflower and peanut. Check the labels, and avoid partially hydrogenated oil, which have unhealthy trans fats.   If youre thirsty, drink water. Coffee and tea are good in moderation, but skip sugary drinks and limit milk and dairy products to one or two daily servings.   The type of carbohydrate in the diet is more important than the amount. Some sources of carbohydrates, such as vegetables, fruits, whole grains, and beans-are healthier than others.   Finally, stay active  Signed, Berniece Salines, DO  12/12/2021 7:09 PM    [facility]

## 2021-12-09 NOTE — Progress Notes (Signed)
Cardiology Office Note:    Date:  12/12/2021   ID:  Ronald Petersen, DOB [birth year], MRN [id number]  PCP:  [name], Alferd Apa, DO  Cardiologist:  Berniece Salines, DO  Electrophysiologist:  None   Referring MD: [name], Alferd Apa, *   " I am having chest pain"   History of Present Illness:    Ronald Petersen is a 65 y.o. 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   In December he was seen at the emergency department [facility] he subsequently underwent a left heart catheterization given the fact that he had had intermittent worsening chest pain.    I saw the patient on December 30, 2018 at that time he was willing to get back on the Imdur but he tells me he started when he started to feel tired in lots of headache so he had stopped this medication.  He continue his Ranexa.   I saw the patient on March 05, 2021 at that time he had been experiencing shortness of breath.  I started him on Lasix 40 mg every other day to see if this is going to help.  Said I saw him his sleep study reported obstructive sleep apnea and was pending CPAP titration.   Past Medical History:  Diagnosis Date   Anxiety    Arthritis    Cancer ([place])    Complication of anesthesia    aspirated with back surgery at age 51   Depression    Diabetes mellitus Type II    GERD (gastroesophageal reflux disease)    Hyperlipidemia    Hypertension    Neuromuscular disorder ([place])    NEUROPATHY   Right rotator cuff tear 11/23/2018   Sleep apnea    no CPAP    Past Surgical History:  Procedure Laterality Date   APPENDECTOMY     ARTHOSCOPIC ROTAOR  CUFF REPAIR Right 11/23/2018   Procedure: ARTHROSCOPIC ROTATOR CUFF REPAIR;  Surgeon: [name], MD;  Location: [place];  Service: Orthopedics;  Laterality: Right;   CHOLECYSTECTOMY     CORONARY STENT INTERVENTION N/A 11/19/2020   Procedure: CORONARY STENT INTERVENTION;  Surgeon: Nelva Bush, MD;  Location: [place] [facility];  Service: Cardiovascular;  Laterality: N/A;   INTRAVASCULAR ULTRASOUND/IVUS N/A 11/19/2020   Procedure: Intravascular Ultrasound/IVUS;  Surgeon: Nelva Bush, MD;  Location: [place] [facility];  Service: Cardiovascular;  Laterality: N/A;   LEFT HEART CATH AND CORONARY ANGIOGRAPHY N/A 11/19/2020   Procedure: LEFT HEART CATH AND CORONARY ANGIOGRAPHY;  Surgeon: Nelva Bush, MD;  Location: [place] [facility];  Service: Cardiovascular;  Laterality: N/A;   LEFT HEART CATH AND CORONARY ANGIOGRAPHY N/A 12/24/2020   Procedure: LEFT HEART CATH AND CORONARY ANGIOGRAPHY;  Surgeon: Martinique, Peter M, MD;  Location: [place] [facility];  Service: Cardiovascular;  Laterality: N/A;   LUMBAR LAMINECTOMY     SHOULDER ARTHROSCOPY WITH ROTATOR CUFF REPAIR AND SUBACROMIAL DECOMPRESSION Right 11/23/2018   Procedure: SHOULDER ARTHROSCOPY WITH ROTATOR CUFF REPAIR AND SUBACROMIAL DECOMPRESSION;  Surgeon: [name], MD;  Location: [place];  Service: Orthopedics;  Laterality: Right;  Current Medications: Current Meds  Medication Sig   acetaminophen (TYLENOL) 650 MG CR tablet Take 1,300 mg by mouth every 8 (eight) hours as needed for pain.   amLODipine (NORVASC) 2.5 MG tablet Take 1 tablet (2.5 mg total) by mouth daily.   aspirin EC 81 MG tablet Take 1 tablet (81 mg total) by mouth daily. Swallow whole.   Continuous Blood Gluc Receiver (FREESTYLE LIBRE 2 READER) DEVI Use as directed to check blood sugar   Continuous Blood Gluc Sensor (FREESTYLE LIBRE 2 SENSOR) MISC Use to check blood sugar, change every 14 days   dicyclomine (BENTYL) 10 MG  capsule Take 1 capsule (10 mg total) by mouth every 8 (eight) hours as needed for spasms.   fenofibrate 160 MG tablet TAKE 1 TABLET (160 MG TOTAL) BY MOUTH DAILY.   furosemide (LASIX) 40 MG tablet TAKE 1 TABLET (40 MG TOTAL) BY MOUTH EVERY OTHER DAY.   gabapentin (NEURONTIN) 800 MG tablet Take 1 tablet (800 mg total) by mouth 3 (three) times daily.   glucose blood test strip USE AS INSTRUCTED TO CHECK BLOOD SUGAR ONCE A DAY   lisinopril (ZESTRIL) 5 MG tablet TAKE 1 TABLET (5 MG TOTAL) BY MOUTH DAILY.   metFORMIN (GLUCOPHAGE-XR) 500 MG 24 hr tablet Take 1 tablet (500 mg total) by mouth daily with breakfast.   ondansetron (ZOFRAN) 4 MG tablet Take 1 tablet (4 mg total) by mouth every 8 (eight) hours as needed.   pantoprazole (PROTONIX) 40 MG tablet TAKE 1 TABLET (40 MG TOTAL) BY MOUTH TWICE A DAY   polyethylene glycol powder (GLYCOLAX/MIRALAX) 17 GM/SCOOP powder Take 17 g by mouth daily. Increase to twice a day as needed (Patient taking differently: Take 17 g by mouth 2 (two) times daily as needed for moderate constipation.)   potassium chloride SA (KLOR-CON) 20 MEQ tablet TAKE 1 TABLET (20 MEQ TOTAL) BY MOUTH EVERY OTHER DAY.   ranolazine (RANEXA) 1000 MG SR tablet TAKE 1 TABLET (1,000 MG TOTAL) BY MOUTH 2 (TWO) TIMES DAILY.   rosuvastatin (CRESTOR) 40 MG tablet TAKE 1 TABLET BY MOUTH ONCE DAILY   sertraline (ZOLOFT) 100 MG tablet TAKE ONE AND ONE-HALF TABLET BY MOUTH ONCE DAILY   ticagrelor (BRILINTA) 90 MG TABS tablet TAKE 1 TABLET (90 MG TOTAL) BY MOUTH TWO TIMES DAILY. (Patient not taking: Reported on 12/11/2021)   ticagrelor (BRILINTA) 90 MG TABS tablet Take 1 tablet (90 mg total) by mouth 2 (two) times daily.   tiZANidine (ZANAFLEX) 4 MG tablet TAKE 1 TABLET (4 MG TOTAL) BY MOUTH EVERY 6 (SIX) HOURS AS NEEDED FOR MUSCLE SPASMS.   traZODone (DESYREL) 50 MG tablet TAKE 1/2-1 TABLET (25-50 MG TOTAL) BY MOUTH AT BEDTIME AS NEEDED FOR SLEEP. (Patient taking differently: Take 50 mg by mouth at  bedtime.)   [DISCONTINUED] isosorbide mononitrate (IMDUR) 60 MG 24 hr tablet Take 1 tablet (60 mg total) by mouth daily.     Allergies:   Niacin   Social History   Socioeconomic History   Marital status: Married    Spouse name: Lorriane Shire   Number of children: 2   Years of education: Not on file   Highest education level: Not on file  Occupational History   Occupation: self employed    Employer: UNEMPLOYED  Tobacco Use   Smoking status: Former    Years: 16.00    Types: Cigarettes    Quit date: 12/29/1985    Years since quitting: 35.9   Smokeless tobacco: Never  Vaping Use  Vaping Use: Never used  Substance and Sexual Activity   Alcohol use: Yes    Alcohol/week: 4.0 - 5.0 standard drinks    Types: 4 - 5 Cans of beer per week    Comment: 5 times per week   Drug use: No   Sexual activity: Yes    Partners: Female  Other Topics Concern   Not on file  Social History Narrative   Exercise-- 3 days   Pt has hs degree   Right handed   One story home   Drinks caffeine   Social Determinants of Health   Financial Resource Strain: Not on file  Food Insecurity: Not on file  Transportation Needs: Not on file  Physical Activity: Not on file  Stress: Not on file  Social Connections: Not on file     Family History: The patient's family history includes Alcohol abuse in an other family member; Arthritis in an other family member; COPD in his mother; Coronary artery disease in an other family member; Depression in an other family member; Heart disease in his sister and sister; Hypertension in an other family member; Ovarian cancer in his sister and other family members; Stomach cancer in his maternal grandmother and sister; Stroke in his father. There is no history of Colon polyps or Colon cancer.  ROS:   Review of Systems  Constitution: Negative for decreased appetite, fever and weight gain.  HENT: Negative for congestion, ear discharge, hoarse voice and sore throat.   Eyes:  Negative for discharge, redness, vision loss in right eye and visual halos.  Cardiovascular: Negative for chest pain, dyspnea on exertion, leg swelling, orthopnea and palpitations.  Respiratory: Negative for cough, hemoptysis, shortness of breath and snoring.   Endocrine: Negative for heat intolerance and polyphagia.  Hematologic/Lymphatic: Negative for bleeding problem. Does not bruise/bleed easily.  Skin: Negative for flushing, nail changes, rash and suspicious lesions.  Musculoskeletal: Negative for arthritis, joint pain, muscle cramps, myalgias, neck pain and stiffness.  Gastrointestinal: Negative for abdominal pain, bowel incontinence, diarrhea and excessive appetite.  Genitourinary: Negative for decreased libido, genital sores and incomplete emptying.  Neurological: Negative for brief paralysis, focal weakness, headaches and loss of balance.  Psychiatric/Behavioral: Negative for altered mental status, depression and suicidal ideas.  Allergic/Immunologic: Negative for HIV exposure and persistent infections.    EKGs/Labs/Other Studies Reviewed:    The following studies were reviewed today:   EKG:  The ekg ordered today demonstrates sinus rhythm, heart rate 61 bpm with evidence of Anterior fascicular block this was on December 5,2022    [place] 11/2021 1st Diag lesion is 25% stenosed. Previously placed Prox LAD to Mid LAD drug eluting stent is widely patent. Previously placed 1st Mrg drug eluting stent is widely patent. Previously placed Prox Cx drug-eluting stents are widely patent. RPAV lesion is 50% stenosed. LV end diastolic pressure is normal.   1. Nonobstructive CAD. Excellent patency of stents in the LAD and LCx. Lesion in the PL branch reduced from 70% to 50%. 2. Normal LV filling pressures. Plan: continue medical management. Anticipate DC today.  Recent Labs: 02/26/2021: TSH 1.82 12/02/2021: ALT 12 12/09/2021: BUN 16; Creatinine, Ser 0.95; Hemoglobin 13.5; Magnesium 2.0;  Platelets 176; Potassium 4.3; Sodium 139  Recent Lipid Panel    Component Value Date/Time   CHOL 105 12/02/2021 1508   TRIG 111.0 12/02/2021 1508   TRIG 148 12/10/2006 1603   HDL 46.40 12/02/2021 1508   CHOLHDL 2 12/02/2021 1508   VLDL 22.2 12/02/2021 1508   LDLCALC  36 12/02/2021 1508   LDLCALC 96 09/06/2020 1138   LDLDIRECT 93.0 05/31/2020 0910    Physical Exam:    VS:  BP (!) 158/76   Pulse (!) 52   Ht 5\' 11"  (1.803 m)   Wt 186 lb (84.4 kg)   SpO2 97%   BMI 25.94 kg/m     Wt Readings from Last 3 Encounters:  12/09/21 186 lb (84.4 kg)  12/02/21 189 lb 3.2 oz (85.8 kg)  11/05/21 185 lb (83.9 kg)     GEN: Well nourished, well developed in no acute distress HEENT: Normal NECK: No JVD; No carotid bruits LYMPHATICS: No lymphadenopathy CARDIAC: S1S2 noted,RRR, no murmurs, rubs, gallops RESPIRATORY:  Clear to auscultation without rales, wheezing or rhonchi  ABDOMEN: Soft, non-tender, non-distended, +bowel sounds, no guarding. EXTREMITIES: No edema, No cyanosis, no clubbing MUSCULOSKELETAL:  No deformity  SKIN: Warm and dry NEUROLOGIC:  Alert and oriented x 3, non-focal PSYCHIATRIC:  Normal affect, good insight  ASSESSMENT:    1. Coronary artery disease of native artery of native heart with stable angina pectoris ([place])   2. Type 2 diabetes mellitus with diabetic neuropathy, without long-term current use of insulin ([place])   3. Medication management   4. Primary hypertension   5. Hyperlipidemia LDL goal <70    PLAN:    He is experiencing chest discomfort I am concerned for progression of coronary artery disease given the fact that we have had the patient on antianginals and he has not responded to this.  I would like to proceed with a left heart catheterization in this patient.  He is agreeable for this. The patient understands that risks include but are not limited to stroke (1 in 1000), death (1 in 5), kidney failure [usually temporary] (1 in 500), bleeding (1 in  200), allergic reaction [possibly serious] (1 in 200), and agrees to proceed.   He is on Ranexa 1000 mg twice daily, has been unable to tolerate Imdur.  What I like to do is give him low-dose amlodipine for its antianginal effect-amlodipine 2.5 mg will be started.  Continue aspirin 81 mg daily with the Brilinta 90 mg twice daily.  Also continue patient on Crestor 40 mg daily.  He is also hypertensive in the office today.  Hopefully with the addition of the amlodipine will also help with his blood pressure as well.  I would prefer he undergo his left heart catheterization before proceeding with any surgical procedures.  The patient is in agreement with the above plan. The patient left the office in stable condition.  The patient will follow up in 2 weeks post cath.   Medication Adjustments/Labs and Tests Ordered: Current medicines are reviewed at length with the patient today.  Concerns regarding medicines are outlined above.  Orders Placed This Encounter  Procedures   Basic Metabolic Panel (BMET)   CBC with Differential/Platelet   Magnesium   Meds ordered this encounter  Medications   DISCONTD: isosorbide mononitrate (IMDUR) 60 MG 24 hr tablet    Sig: Take 1 tablet (60 mg total) by mouth daily.    Dispense:  90 tablet    Refill:  3   amLODipine (NORVASC) 2.5 MG tablet    Sig: Take 1 tablet (2.5 mg total) by mouth daily.    Dispense:  90 tablet    Refill:  3   ticagrelor (BRILINTA) 90 MG TABS tablet    Sig: Take 1 tablet (90 mg total) by mouth 2 (two) times daily.  Dispense:  36 tablet    Refill:  0    Patient Instructions  Medication Instructions:  Your physician has recommended you make the following change in your medication:  STOP: Imdur START: Amlodipine 2.5 mg once daily  *If you need a refill on your cardiac medications before your next appointment, please call your pharmacy*   Lab Work: Your physician recommends that you return for lab work in:  TODAY: BMET,  Mag, CBC If you have labs (blood work) drawn today and your tests are completely normal, you will receive your results only by: MyChart Message (if you have MyChart) OR A paper copy in the mail If you have any lab test that is abnormal or we need to change your treatment, we will call you to review the results.   Testing/Procedures:  [facility] [place] [address] Dept: [phone] Loc: [phone]  Ronald Petersen  12/09/2021  You are scheduled for a Cardiac Catheterization on Monday, December 19 with Dr. Glenetta Hew.  1. Please arrive at the [facility] (Main Entrance A) at [facility]: [address] [place], [place] [address] at 5:30 AM (This time is two hours before your procedure to ensure your preparation). Free valet parking service is available.   Special note: Every effort is made to have your procedure done on time. Please understand that emergencies sometimes delay scheduled procedures.  2. Diet: Do not eat solid foods after midnight.  The patient may have clear liquids until 5am upon the day of the procedure.  3. Labs: You will need to have blood drawn on TODAY.  4. Medication instructions in preparation for your procedure:   Contrast Allergy: No  Do not take Diabetes Med Glucophage (Metformin) on the day of the procedure and HOLD 48 HOURS AFTER THE PROCEDURE.  On the morning of your procedure, take your Aspirin and any morning medicines NOT listed above.  You may use sips of water.  5. Plan for one night stay--bring personal belongings. 6. Bring a current list of your medications and current insurance cards. 7. You MUST have a responsible person to drive you home. 8. Someone MUST be with you the first 24 hours after you arrive home or your discharge will be delayed. 9. Please wear clothes that are easy to get on and off and wear slip-on  shoes.  Thank you for allowing [place] to care for you!   -- [place] Invasive Cardiovascular services    Follow-Up: At [facility], you and your health needs are our priority.  As part of our continuing mission to provide you with exceptional heart care, we have created designated Provider Care Teams.  These Care Teams include your primary Cardiologist (physician) and Advanced Practice Providers (APPs -  Physician Assistants and Nurse Practitioners) who all work together to provide you with the care you need, when you need it.  We recommend signing up for the patient portal called "MyChart".  Sign up information is provided on this After Visit Summary.  MyChart is used to connect with patients for Virtual Visits (Telemedicine).  Patients are able to view lab/test results, encounter notes, upcoming appointments, etc.  Non-urgent messages can be sent to your provider as well.   To learn more about what you can do with MyChart, go to [email or web address].    Your next appointment:   2 week(s) after Cath  The format for your next appointment:  In Person  Provider:   Berniece Salines, DO     Other Instructions     Adopting a Healthy Lifestyle.  Know what a healthy weight is for you (roughly BMI <25) and aim to maintain this   Aim for 7+ servings of fruits and vegetables daily   65-80+ fluid ounces of water or unsweet tea for healthy kidneys   Limit to max 1 drink of alcohol per day; avoid smoking/tobacco   Limit animal fats in diet for cholesterol and heart health - choose grass fed whenever available   Avoid highly processed foods, and foods high in saturated/trans fats   Aim for low stress - take time to unwind and care for your mental health   Aim for 150 min of moderate intensity exercise weekly for heart health, and weights twice weekly for bone health   Aim for 7-9 hours of sleep daily   When it comes to diets, agreement about the perfect plan isnt easy to find, even  among the experts. Experts at the [employer] developed an idea known as the Healthy Eating Plate. Just imagine a plate divided into logical, healthy portions.   The emphasis is on diet quality:   Load up on vegetables and fruits - one-half of your plate: Aim for color and variety, and remember that potatoes dont count.   Go for whole grains - one-quarter of your plate: Whole wheat, barley, wheat berries, quinoa, oats, brown rice, and foods made with them. If you want pasta, go with whole wheat pasta.   Protein power - one-quarter of your plate: Fish, chicken, beans, and nuts are all healthy, versatile protein sources. Limit red meat.   The diet, however, does go beyond the plate, offering a few other suggestions.   Use healthy plant oils, such as olive, canola, soy, corn, sunflower and peanut. Check the labels, and avoid partially hydrogenated oil, which have unhealthy trans fats.   If youre thirsty, drink water. Coffee and tea are good in moderation, but skip sugary drinks and limit milk and dairy products to one or two daily servings.   The type of carbohydrate in the diet is more important than the amount. Some sources of carbohydrates, such as vegetables, fruits, whole grains, and beans-are healthier than others.   Finally, stay active  Signed, Berniece Salines, DO  12/12/2021 7:09 PM    [facility]

## 2021-12-09 NOTE — Patient Instructions (Addendum)
Medication Instructions:  Your physician has recommended you make the following change in your medication:  STOP: Imdur START: Amlodipine 2.5 mg once daily  *If you need a refill on your cardiac medications before your next appointment, please call your pharmacy*   Lab Work: Your physician recommends that you return for lab work in:  TODAY: BMET, Mag, CBC If you have labs (blood work) drawn today and your tests are completely normal, you will receive your results only by: MyChart Message (if you have MyChart) OR A paper copy in the mail If you have any lab test that is abnormal or we need to change your treatment, we will call you to review the results.   Testing/Procedures:  [place] [address] Dept: [phone] Loc: [phone]  Ronald Petersen  12/09/2021  You are scheduled for a Cardiac Catheterization on Monday, December 19 with Dr. Glenetta Hew.  1. Please arrive at the [facility] (Main Entrance A) at [facility]: [address] [place], [place] [address] at 5:30 AM (This time is two hours before your procedure to ensure your preparation). Free valet parking service is available.   Special note: Every effort is made to have your procedure done on time. Please understand that emergencies sometimes delay scheduled procedures.  2. Diet: Do not eat solid foods after midnight.  The patient may have clear liquids until 5am upon the day of the procedure.  3. Labs: You will need to have blood drawn on TODAY.  4. Medication instructions in preparation for your procedure:   Contrast Allergy: No  Do not take Diabetes Med Glucophage (Metformin) on the day of the procedure and HOLD 48 HOURS AFTER THE PROCEDURE.  On the morning of your procedure, take your Aspirin and any morning medicines NOT listed above.  You may use sips of water.  5. Plan for one  night stay--bring personal belongings. 6. Bring a current list of your medications and current insurance cards. 7. You MUST have a responsible person to drive you home. 8. Someone MUST be with you the first 24 hours after you arrive home or your discharge will be delayed. 9. Please wear clothes that are easy to get on and off and wear slip-on shoes.  Thank you for allowing [place] to care for you!   --  Invasive Cardiovascular services    Follow-Up: At [facility], you and your health needs are our priority.  As part of our continuing mission to provide you with exceptional heart care, we have created designated Provider Care Teams.  These Care Teams include your primary Cardiologist (physician) and Advanced Practice Providers (APPs -  Physician Assistants and Nurse Practitioners) who all work together to provide you with the care you need, when you need it.  We recommend signing up for the patient portal called "MyChart".  Sign up information is provided on this After Visit Summary.  MyChart is used to connect with patients for Virtual Visits (Telemedicine).  Patients are able to view lab/test results, encounter notes, upcoming appointments, etc.  Non-urgent messages can be sent to your provider as well.   To learn more about what you can do with MyChart, go to [email or web address].    Your next appointment:   2 week(s) after Cath  The format for your next appointment:   In Person  Provider:   Berniece Salines, DO     Other Instructions

## 2021-12-12 ENCOUNTER — Other Ambulatory Visit (HOSPITAL_BASED_OUTPATIENT_CLINIC_OR_DEPARTMENT_OTHER): Payer: Self-pay

## 2021-12-12 ENCOUNTER — Telehealth: Payer: Self-pay | Admitting: *Deleted

## 2021-12-12 NOTE — Telephone Encounter (Signed)
Cardiac catheterization scheduled at [facility] for: Monday December 16, 2021 7:30 [facility] Main Entrance A [facility]) at: 5:30 AM   Diet-no solid food after midnight prior to cath, clear liquids until 5 AM day of procedure.  Medication instructions for procedure: -Hold:  Metformin-day of procedure and 48 hours post procedure  Lasix/KCl-AM of procedure -Except hold medications usual morning medications can be taken pre-cath with sips of water including aspirin 81 mg and Brilinta 90 mg.    Confirmed patient has responsible adult to drive home post procedure and be with patient first 24 hours after arriving home.  [facility] does allow one visitor to accompany you and wait in the hospital waiting room while you are there for your procedure. You and your visitor will be asked to wear a mask once you enter the hospital.   Patient reports does not currently have any new symptoms concerning for COVID-19 and no household members with COVID-19 like illness.    Reviewed procedure/mask/visitor instructions with patient.

## 2021-12-16 ENCOUNTER — Other Ambulatory Visit: Payer: Self-pay

## 2021-12-16 ENCOUNTER — Encounter (HOSPITAL_COMMUNITY): Payer: Self-pay | Admitting: Cardiology

## 2021-12-16 ENCOUNTER — Inpatient Hospital Stay (HOSPITAL_COMMUNITY): Payer: Medicare HMO

## 2021-12-16 ENCOUNTER — Inpatient Hospital Stay (HOSPITAL_COMMUNITY)
Admission: AD | Admit: 2021-12-16 | Discharge: 2021-12-28 | DRG: 234 | Disposition: A | Discharge status: Home / Self Care | Payer: Medicare HMO | Attending: Thoracic Surgery (Cardiothoracic Vascular Surgery) | Admitting: Thoracic Surgery (Cardiothoracic Vascular Surgery)

## 2021-12-16 ENCOUNTER — Encounter (HOSPITAL_COMMUNITY)
Admission: AD | Disposition: A | Discharge status: Home / Self Care | Payer: Self-pay | Source: Home / Self Care | Attending: Cardiology

## 2021-12-16 DIAGNOSIS — D696 Thrombocytopenia, unspecified: Secondary | ICD-10-CM | POA: Diagnosis not present

## 2021-12-16 DIAGNOSIS — I25119 Atherosclerotic heart disease of native coronary artery with unspecified angina pectoris: Secondary | ICD-10-CM | POA: Diagnosis not present

## 2021-12-16 DIAGNOSIS — I2510 Atherosclerotic heart disease of native coronary artery without angina pectoris: Secondary | ICD-10-CM

## 2021-12-16 DIAGNOSIS — Z01810 Encounter for preprocedural cardiovascular examination: Secondary | ICD-10-CM

## 2021-12-16 DIAGNOSIS — Z6826 Body mass index (BMI) 26.0-26.9, adult: Secondary | ICD-10-CM

## 2021-12-16 DIAGNOSIS — Z818 Family history of other mental and behavioral disorders: Secondary | ICD-10-CM | POA: Diagnosis not present

## 2021-12-16 DIAGNOSIS — I10 Essential (primary) hypertension: Secondary | ICD-10-CM | POA: Diagnosis not present

## 2021-12-16 DIAGNOSIS — Z951 Presence of aortocoronary bypass graft: Secondary | ICD-10-CM

## 2021-12-16 DIAGNOSIS — E785 Hyperlipidemia, unspecified: Secondary | ICD-10-CM | POA: Diagnosis present

## 2021-12-16 DIAGNOSIS — Z836 Family history of other diseases of the respiratory system: Secondary | ICD-10-CM

## 2021-12-16 DIAGNOSIS — I493 Ventricular premature depolarization: Secondary | ICD-10-CM | POA: Diagnosis not present

## 2021-12-16 DIAGNOSIS — Z09 Encounter for follow-up examination after completed treatment for conditions other than malignant neoplasm: Secondary | ICD-10-CM

## 2021-12-16 DIAGNOSIS — E119 Type 2 diabetes mellitus without complications: Secondary | ICD-10-CM | POA: Diagnosis not present

## 2021-12-16 DIAGNOSIS — Z01818 Encounter for other preprocedural examination: Secondary | ICD-10-CM | POA: Diagnosis not present

## 2021-12-16 DIAGNOSIS — Z9911 Dependence on respirator [ventilator] status: Secondary | ICD-10-CM | POA: Diagnosis not present

## 2021-12-16 DIAGNOSIS — E1140 Type 2 diabetes mellitus with diabetic neuropathy, unspecified: Secondary | ICD-10-CM | POA: Diagnosis present

## 2021-12-16 DIAGNOSIS — Z823 Family history of stroke: Secondary | ICD-10-CM | POA: Diagnosis not present

## 2021-12-16 DIAGNOSIS — J9811 Atelectasis: Secondary | ICD-10-CM | POA: Diagnosis not present

## 2021-12-16 DIAGNOSIS — Z20822 Contact with and (suspected) exposure to covid-19: Secondary | ICD-10-CM | POA: Diagnosis present

## 2021-12-16 DIAGNOSIS — R110 Nausea: Secondary | ICD-10-CM

## 2021-12-16 DIAGNOSIS — Z800 Family history of malignant neoplasm of digestive organs: Secondary | ICD-10-CM

## 2021-12-16 DIAGNOSIS — K219 Gastro-esophageal reflux disease without esophagitis: Secondary | ICD-10-CM | POA: Diagnosis present

## 2021-12-16 DIAGNOSIS — Z955 Presence of coronary angioplasty implant and graft: Secondary | ICD-10-CM

## 2021-12-16 DIAGNOSIS — E1165 Type 2 diabetes mellitus with hyperglycemia: Secondary | ICD-10-CM | POA: Diagnosis not present

## 2021-12-16 DIAGNOSIS — Z87891 Personal history of nicotine dependence: Secondary | ICD-10-CM

## 2021-12-16 DIAGNOSIS — Z8041 Family history of malignant neoplasm of ovary: Secondary | ICD-10-CM

## 2021-12-16 DIAGNOSIS — J939 Pneumothorax, unspecified: Secondary | ICD-10-CM

## 2021-12-16 DIAGNOSIS — I083 Combined rheumatic disorders of mitral, aortic and tricuspid valves: Secondary | ICD-10-CM | POA: Diagnosis not present

## 2021-12-16 DIAGNOSIS — Z9861 Coronary angioplasty status: Secondary | ICD-10-CM

## 2021-12-16 DIAGNOSIS — J9601 Acute respiratory failure with hypoxia: Secondary | ICD-10-CM | POA: Diagnosis not present

## 2021-12-16 DIAGNOSIS — Z7902 Long term (current) use of antithrombotics/antiplatelets: Secondary | ICD-10-CM

## 2021-12-16 DIAGNOSIS — Z8249 Family history of ischemic heart disease and other diseases of the circulatory system: Secondary | ICD-10-CM

## 2021-12-16 DIAGNOSIS — G4700 Insomnia, unspecified: Secondary | ICD-10-CM

## 2021-12-16 DIAGNOSIS — G4733 Obstructive sleep apnea (adult) (pediatric): Secondary | ICD-10-CM | POA: Diagnosis not present

## 2021-12-16 DIAGNOSIS — Z7982 Long term (current) use of aspirin: Secondary | ICD-10-CM

## 2021-12-16 DIAGNOSIS — E1169 Type 2 diabetes mellitus with other specified complication: Secondary | ICD-10-CM | POA: Diagnosis not present

## 2021-12-16 DIAGNOSIS — D62 Acute posthemorrhagic anemia: Secondary | ICD-10-CM | POA: Diagnosis not present

## 2021-12-16 DIAGNOSIS — I25110 Atherosclerotic heart disease of native coronary artery with unstable angina pectoris: Principal | ICD-10-CM | POA: Diagnosis present

## 2021-12-16 DIAGNOSIS — E669 Obesity, unspecified: Secondary | ICD-10-CM | POA: Diagnosis present

## 2021-12-16 HISTORY — PX: LEFT HEART CATH AND CORONARY ANGIOGRAPHY: CATH118249

## 2021-12-16 LAB — ECHOCARDIOGRAM COMPLETE
AR max vel: 2.21 cm2
AV Area VTI: 2.28 cm2
AV Area mean vel: 2.26 cm2
AV Mean grad: 10 mmHg
AV Peak grad: 18.1 mmHg
Ao pk vel: 2.13 m/s
Area-P 1/2: 4.71 cm2
Calc EF: 62.4 %
Height: 71 in
S' Lateral: 3.1 cm
Single Plane A2C EF: 58.2 %
Single Plane A4C EF: 62.1 %
Weight: 2960 oz

## 2021-12-16 LAB — GLUCOSE, CAPILLARY
Glucose-Capillary: 136 mg/dL — ABNORMAL HIGH (ref 70–99)
Glucose-Capillary: 101 mg/dL — ABNORMAL HIGH (ref 70–99)
Glucose-Capillary: 109 mg/dL — ABNORMAL HIGH (ref 70–99)
Glucose-Capillary: 117 mg/dL — ABNORMAL HIGH (ref 70–99)

## 2021-12-16 LAB — HEMOGLOBIN A1C
Hgb A1c MFr Bld: 6.2 % — ABNORMAL HIGH (ref 4.8–5.6)
Mean Plasma Glucose: 131.24 mg/dL

## 2021-12-16 LAB — HEPARIN LEVEL (UNFRACTIONATED): Heparin Unfractionated: 0.19 IU/mL — ABNORMAL LOW (ref 0.30–0.70)

## 2021-12-16 SURGERY — LEFT HEART CATH AND CORONARY ANGIOGRAPHY
Anesthesia: LOCAL

## 2021-12-16 MED ORDER — FREESTYLE LIBRE 2 SENSOR MISC: 1.0000 | Status: DC

## 2021-12-16 MED ORDER — GABAPENTIN 400 MG PO CAPS
800.0000 mg | ORAL_CAPSULE | Freq: Three times a day (TID) | ORAL | Status: DC
  Administered 2021-12-16 – 2021-12-23 (×23): 800 mg via ORAL
  Filled 2021-12-16 (×23): qty 2

## 2021-12-16 MED ORDER — INSULIN ASPART 100 UNIT/ML IJ SOLN
0.0000 [IU] | Freq: Three times a day (TID) | INTRAMUSCULAR | Status: DC
  Administered 2021-12-17 – 2021-12-18 (×3): 2 [IU] via SUBCUTANEOUS
  Administered 2021-12-20: 17:00:00 3 [IU] via SUBCUTANEOUS
  Administered 2021-12-20 – 2021-12-21 (×2): 2 [IU] via SUBCUTANEOUS
  Administered 2021-12-21: 09:00:00 3 [IU] via SUBCUTANEOUS
  Administered 2021-12-21 – 2021-12-23 (×5): 2 [IU] via SUBCUTANEOUS
  Administered 2021-12-23: 13:00:00 3 [IU] via SUBCUTANEOUS

## 2021-12-16 MED ORDER — FUROSEMIDE 20 MG PO TABS
20.0000 mg | ORAL_TABLET | Freq: Every day | ORAL | Status: DC
  Administered 2021-12-17: 08:00:00 20 mg via ORAL
  Filled 2021-12-16 (×2): qty 1

## 2021-12-16 MED ORDER — LISINOPRIL 5 MG PO TABS
5.0000 mg | ORAL_TABLET | Freq: Every day | ORAL | Status: DC
  Administered 2021-12-17: 08:00:00 5 mg via ORAL
  Filled 2021-12-16 (×2): qty 1

## 2021-12-16 MED ORDER — VERAPAMIL HCL 2.5 MG/ML IV SOLN
INTRAVENOUS | Status: AC
  Filled 2021-12-16: qty 2

## 2021-12-16 MED ORDER — FREESTYLE LIBRE 2 READER DEVI: 1.0000 | Status: DC

## 2021-12-16 MED ORDER — SODIUM CHLORIDE 0.9% FLUSH
3.0000 mL | Freq: Two times a day (BID) | INTRAVENOUS | Status: DC
  Administered 2021-12-16 – 2021-12-24 (×8): 3 mL via INTRAVENOUS

## 2021-12-16 MED ORDER — SERTRALINE HCL 50 MG PO TABS
150.0000 mg | ORAL_TABLET | Freq: Every day | ORAL | Status: DC
  Administered 2021-12-16 – 2021-12-27 (×12): 150 mg via ORAL
  Filled 2021-12-16 (×12): qty 1

## 2021-12-16 MED ORDER — SODIUM CHLORIDE 0.9 % IV SOLN: 250.0000 mL | INTRAVENOUS | Status: DC | PRN

## 2021-12-16 MED ORDER — SODIUM CHLORIDE 0.9 % WEIGHT BASED INFUSION
3.0000 mL/kg/h | INTRAVENOUS | Status: DC
  Administered 2021-12-16: 06:00:00 3 mL/kg/h via INTRAVENOUS

## 2021-12-16 MED ORDER — FENTANYL CITRATE (PF) 100 MCG/2ML IJ SOLN
INTRAMUSCULAR | Status: AC
  Filled 2021-12-16: qty 2

## 2021-12-16 MED ORDER — ASPIRIN EC 81 MG PO TBEC
81.0000 mg | DELAYED_RELEASE_TABLET | Freq: Every day | ORAL | Status: DC
  Administered 2021-12-16 – 2021-12-23 (×8): 81 mg via ORAL
  Filled 2021-12-16 (×8): qty 1

## 2021-12-16 MED ORDER — VITAMIN E 45 MG (100 UNIT) PO CAPS
1600.0000 [IU] | ORAL_CAPSULE | Freq: Every day | ORAL | Status: DC
  Filled 2021-12-16: qty 16

## 2021-12-16 MED ORDER — IOHEXOL 350 MG/ML SOLN
INTRAVENOUS | Status: DC | PRN
  Administered 2021-12-16: 09:00:00 50 mL

## 2021-12-16 MED ORDER — VITAMIN E 180 MG (400 UNIT) PO CAPS: 1600.0000 [IU] | ORAL_CAPSULE | Freq: Every day | ORAL | Status: DC

## 2021-12-16 MED ORDER — PANTOPRAZOLE SODIUM 40 MG PO TBEC
40.0000 mg | DELAYED_RELEASE_TABLET | Freq: Two times a day (BID) | ORAL | Status: DC
  Administered 2021-12-16 – 2021-12-23 (×15): 40 mg via ORAL
  Filled 2021-12-16 (×16): qty 1

## 2021-12-16 MED ORDER — TIZANIDINE HCL 4 MG PO TABS
4.0000 mg | ORAL_TABLET | Freq: Four times a day (QID) | ORAL | Status: DC | PRN
  Administered 2021-12-16 – 2021-12-23 (×4): 4 mg via ORAL
  Filled 2021-12-16 (×7): qty 1

## 2021-12-16 MED ORDER — TICAGRELOR 90 MG PO TABS: 90.0000 mg | ORAL_TABLET | ORAL | Status: AC

## 2021-12-16 MED ORDER — POTASSIUM CHLORIDE CRYS ER 20 MEQ PO TBCR
40.0000 meq | EXTENDED_RELEASE_TABLET | Freq: Every day | ORAL | Status: DC
  Administered 2021-12-17: 08:00:00 40 meq via ORAL
  Filled 2021-12-16 (×2): qty 2

## 2021-12-16 MED ORDER — VERAPAMIL HCL 2.5 MG/ML IV SOLN
INTRAVENOUS | Status: DC | PRN
  Administered 2021-12-16: 08:00:00 10 mL via INTRA_ARTERIAL

## 2021-12-16 MED ORDER — GABAPENTIN 800 MG PO TABS
800.0000 mg | ORAL_TABLET | Freq: Three times a day (TID) | ORAL | Status: DC
  Filled 2021-12-16 (×2): qty 1

## 2021-12-16 MED ORDER — HEPARIN (PORCINE) 25000 UT/250ML-% IV SOLN
1400.0000 [IU]/h | INTRAVENOUS | Status: DC
  Administered 2021-12-16: 15:00:00 1200 [IU]/h via INTRAVENOUS
  Administered 2021-12-17 – 2021-12-23 (×9): 1400 [IU]/h via INTRAVENOUS
  Filled 2021-12-16 (×10): qty 250

## 2021-12-16 MED ORDER — VITAMIN B-12 1000 MCG PO TABS
2500.0000 ug | ORAL_TABLET | Freq: Every day | ORAL | Status: DC
  Administered 2021-12-16 – 2021-12-23 (×7): 2500 ug via ORAL
  Filled 2021-12-16 (×8): qty 3

## 2021-12-16 MED ORDER — POLYETHYLENE GLYCOL 3350 17 GM/SCOOP PO POWD
17.0000 g | Freq: Every day | ORAL | Status: DC
  Filled 2021-12-16: qty 255

## 2021-12-16 MED ORDER — ASPIRIN 81 MG PO CHEW: 81.0000 mg | CHEWABLE_TABLET | ORAL | Status: DC

## 2021-12-16 MED ORDER — AMLODIPINE BESYLATE 2.5 MG PO TABS
2.5000 mg | ORAL_TABLET | Freq: Every day | ORAL | Status: DC
  Administered 2021-12-16 – 2021-12-17 (×2): 2.5 mg via ORAL
  Filled 2021-12-16 (×3): qty 1

## 2021-12-16 MED ORDER — SODIUM CHLORIDE 0.9 % WEIGHT BASED INFUSION: 1.0000 mL/kg/h | INTRAVENOUS | Status: DC

## 2021-12-16 MED ORDER — ONDANSETRON HCL 4 MG/2ML IJ SOLN: 4.0000 mg | Freq: Four times a day (QID) | INTRAMUSCULAR | Status: DC | PRN

## 2021-12-16 MED ORDER — TRAZODONE HCL 50 MG PO TABS
50.0000 mg | ORAL_TABLET | Freq: Every day | ORAL | Status: DC
  Administered 2021-12-16 – 2021-12-23 (×8): 50 mg via ORAL
  Filled 2021-12-16 (×8): qty 1

## 2021-12-16 MED ORDER — SODIUM CHLORIDE 0.9% FLUSH: 3.0000 mL | INTRAVENOUS | Status: DC | PRN

## 2021-12-16 MED ORDER — POLYETHYLENE GLYCOL 3350 17 G PO PACK
17.0000 g | PACK | Freq: Every day | ORAL | Status: DC
  Administered 2021-12-17 – 2021-12-22 (×6): 17 g via ORAL
  Filled 2021-12-16 (×7): qty 1

## 2021-12-16 MED ORDER — ACETAMINOPHEN ER 650 MG PO TBCR: 1300.0000 mg | EXTENDED_RELEASE_TABLET | Freq: Three times a day (TID) | ORAL | Status: DC | PRN

## 2021-12-16 MED ORDER — HEPARIN SODIUM (PORCINE) 1000 UNIT/ML IJ SOLN
INTRAMUSCULAR | Status: AC
  Filled 2021-12-16: qty 10

## 2021-12-16 MED ORDER — OXYCODONE HCL 5 MG PO TABS: 5.0000 mg | ORAL_TABLET | Freq: Four times a day (QID) | ORAL | Status: DC | PRN

## 2021-12-16 MED ORDER — SODIUM CHLORIDE 0.9 % IV SOLN: INTRAVENOUS | Status: AC

## 2021-12-16 MED ORDER — MAGNESIUM OXIDE -MG SUPPLEMENT 400 (240 MG) MG PO TABS
400.0000 mg | ORAL_TABLET | Freq: Every day | ORAL | Status: DC
  Administered 2021-12-16 – 2021-12-23 (×8): 400 mg via ORAL
  Filled 2021-12-16 (×8): qty 1

## 2021-12-16 MED ORDER — HEPARIN (PORCINE) IN NACL 2000-0.9 UNIT/L-% IV SOLN
INTRAVENOUS | Status: AC
  Filled 2021-12-16: qty 1000

## 2021-12-16 MED ORDER — MORPHINE SULFATE (PF) 2 MG/ML IV SOLN
2.0000 mg | Freq: Four times a day (QID) | INTRAVENOUS | Status: DC | PRN
  Filled 2021-12-16: qty 1

## 2021-12-16 MED ORDER — TICAGRELOR 90 MG PO TABS
ORAL_TABLET | ORAL | Status: AC
  Administered 2021-12-16: 06:00:00 90 mg via ORAL
  Filled 2021-12-16: qty 1

## 2021-12-16 MED ORDER — LABETALOL HCL 5 MG/ML IV SOLN: 10.0000 mg | INTRAVENOUS | Status: AC | PRN

## 2021-12-16 MED ORDER — ROSUVASTATIN CALCIUM 20 MG PO TABS
40.0000 mg | ORAL_TABLET | Freq: Every day | ORAL | Status: DC
  Administered 2021-12-16 – 2021-12-27 (×12): 40 mg via ORAL
  Filled 2021-12-16 (×12): qty 2

## 2021-12-16 MED ORDER — ACETAMINOPHEN 325 MG PO TABS
650.0000 mg | ORAL_TABLET | Freq: Four times a day (QID) | ORAL | Status: DC | PRN
  Administered 2021-12-17 – 2021-12-23 (×4): 650 mg via ORAL
  Filled 2021-12-16 (×4): qty 2

## 2021-12-16 MED ORDER — FENOFIBRATE 160 MG PO TABS
160.0000 mg | ORAL_TABLET | Freq: Every day | ORAL | Status: DC
  Administered 2021-12-16 – 2021-12-23 (×8): 160 mg via ORAL
  Filled 2021-12-16 (×10): qty 1

## 2021-12-16 MED ORDER — MAGNESIUM CITRATE 200 MG PO TABS: 400.0000 mg | ORAL_TABLET | Freq: Every day | ORAL | Status: DC

## 2021-12-16 MED ORDER — DICYCLOMINE HCL 10 MG PO CAPS: 10.0000 mg | ORAL_CAPSULE | Freq: Three times a day (TID) | ORAL | Status: DC | PRN

## 2021-12-16 MED ORDER — RANOLAZINE ER 500 MG PO TB12
1000.0000 mg | ORAL_TABLET | Freq: Two times a day (BID) | ORAL | Status: DC
  Administered 2021-12-16 – 2021-12-23 (×15): 1000 mg via ORAL
  Filled 2021-12-16 (×15): qty 2

## 2021-12-16 MED ORDER — MIDAZOLAM HCL 2 MG/2ML IJ SOLN
INTRAMUSCULAR | Status: AC
  Filled 2021-12-16: qty 2

## 2021-12-16 MED ORDER — PANTOPRAZOLE SODIUM 40 MG PO TBEC: 40.0000 mg | DELAYED_RELEASE_TABLET | Freq: Every day | ORAL | Status: DC

## 2021-12-16 MED ORDER — HYDRALAZINE HCL 20 MG/ML IJ SOLN: 10.0000 mg | INTRAMUSCULAR | Status: AC | PRN

## 2021-12-16 MED ORDER — FENTANYL CITRATE (PF) 100 MCG/2ML IJ SOLN
INTRAMUSCULAR | Status: DC | PRN
  Administered 2021-12-16: 25 ug via INTRAVENOUS

## 2021-12-16 MED ORDER — LIDOCAINE HCL (PF) 1 % IJ SOLN
INTRAMUSCULAR | Status: DC | PRN
  Administered 2021-12-16: 2 mL

## 2021-12-16 MED ORDER — MIDAZOLAM HCL 2 MG/2ML IJ SOLN
INTRAMUSCULAR | Status: DC | PRN
  Administered 2021-12-16: 1 mg via INTRAVENOUS

## 2021-12-16 MED ORDER — SODIUM CHLORIDE 0.9% FLUSH
3.0000 mL | Freq: Two times a day (BID) | INTRAVENOUS | Status: DC
  Administered 2021-12-21 – 2021-12-24 (×3): 3 mL via INTRAVENOUS

## 2021-12-16 MED ORDER — HEPARIN SODIUM (PORCINE) 1000 UNIT/ML IJ SOLN
INTRAMUSCULAR | Status: DC | PRN
  Administered 2021-12-16: 4000 [IU] via INTRAVENOUS

## 2021-12-16 MED ORDER — NITROGLYCERIN 0.4 MG SL SUBL: 0.4000 mg | SUBLINGUAL_TABLET | SUBLINGUAL | Status: DC | PRN

## 2021-12-16 MED ORDER — VITAMIN E 180 MG (400 UNIT) PO CAPS
1600.0000 [IU] | ORAL_CAPSULE | Freq: Every day | ORAL | Status: DC
  Administered 2021-12-18 – 2021-12-23 (×6): 1600 [IU] via ORAL
  Filled 2021-12-16 (×10): qty 4

## 2021-12-16 MED ORDER — LIDOCAINE HCL (PF) 1 % IJ SOLN
INTRAMUSCULAR | Status: AC
  Filled 2021-12-16: qty 30

## 2021-12-16 MED ORDER — HEPARIN (PORCINE) IN NACL 2000-0.9 UNIT/L-% IV SOLN
INTRAVENOUS | Status: DC | PRN
  Administered 2021-12-16: 1000 mL

## 2021-12-16 SURGICAL SUPPLY — 10 items
CATH OPTITORQUE TIG 4.0 5F (CATHETERS) ×1 IMPLANT
DEVICE RAD COMP TR BAND LRG (VASCULAR PRODUCTS) ×1 IMPLANT
GLIDESHEATH SLEND SS 6F .021 (SHEATH) ×1 IMPLANT
GUIDEWIRE INQWIRE 1.5J.035X260 (WIRE) IMPLANT
INQWIRE 1.5J .035X260CM (WIRE) ×2
KIT HEART LEFT (KITS) ×2 IMPLANT
PACK CARDIAC CATHETERIZATION (CUSTOM PROCEDURE TRAY) ×2 IMPLANT
SYR MEDRAD MARK 7 150ML (SYRINGE) ×2 IMPLANT
TRANSDUCER W/STOPCOCK (MISCELLANEOUS) ×2 IMPLANT
TUBING CIL FLEX 10 FLL-RA (TUBING) ×2 IMPLANT

## 2021-12-16 NOTE — Interval H&P Note (Signed)
History and Physical Interval Note:  12/16/2021 8:17 AM  Ronald Petersen  has presented today for surgery, with the diagnosis of cad - angina.  The various methods of treatment have been discussed with the patient and family. After consideration of risks, benefits and other options for treatment, the patient has consented to  Procedure(s): LEFT HEART CATH AND CORONARY ANGIOGRAPHY (N/A)  PERCUTANEOUS CORONARY INTERVENTION   as a surgical intervention.  The patient's history has been reviewed, patient examined, no change in status, stable for surgery.  I have reviewed the patient's chart and labs.  Questions were answered to the patient's satisfaction.    Cath Lab Visit (complete for each Cath Lab visit)  Clinical Evaluation Leading to the Procedure:   ACS: No.  Non-ACS:    Anginal Classification: CCS III  Anti-ischemic medical therapy: Maximal Therapy (2 or more classes of medications)  Non-Invasive Test Results: No non-invasive testing performed  Prior CABG: No previous CABG    Glenetta Hew

## 2021-12-16 NOTE — Progress Notes (Signed)
Progress Note  Patient Name: Ronald Petersen Date of Encounter: 12/16/2021  Primary Cardiologist: Berniece Salines, DO   Subjective   Patient seen and examined at his bedside. His son was at the bedside when I arrived. He offers no complaints.S/p LHC today, currently on heparin gtt.   Inpatient Medications    Scheduled Meds:  amLODipine  2.5 mg Oral Daily   aspirin EC  81 mg Oral Daily   fenofibrate  160 mg Oral Daily   [START ON 12/17/2021] furosemide  20 mg Oral Daily   gabapentin  800 mg Oral TID   insulin aspart  0-15 Units Subcutaneous TID WC   [START ON 12/17/2021] lisinopril  5 mg Oral Daily   magnesium oxide  400 mg Oral Daily   pantoprazole  40 mg Oral BID AC   polyethylene glycol  17 g Oral Daily   [START ON 12/17/2021] potassium chloride SA  40 mEq Oral Daily   ranolazine  1,000 mg Oral BID   rosuvastatin  40 mg Oral Daily   [START ON 12/17/2021] sertraline  150 mg Oral Daily   sodium chloride flush  3 mL Intravenous Q12H   sodium chloride flush  3 mL Intravenous Q12H   traZODone  50 mg Oral QHS   vitamin B-12  2,500 mcg Oral Daily   Vitamin E  1,600 Units Oral Daily   Continuous Infusions:  sodium chloride     heparin 1,200 Units/hr (12/16/21 1527)   PRN Meds: sodium chloride, acetaminophen, dicyclomine, morphine injection, nitroGLYCERIN, ondansetron (ZOFRAN) IV, oxyCODONE, sodium chloride flush, tiZANidine   Vital Signs    Vitals:   12/16/21 1315 12/16/21 1523 12/16/21 1552 12/16/21 1652  BP:  136/67 132/65 127/60  Pulse: (!) 58 (!) 56 (!) 51 (!) 57  Resp: 16 15    Temp:  98.1 F (36.7 C)    TempSrc:  Oral    SpO2: 99% 99% 100% 98%  Weight:  84.9 kg    Height:  5\' 11"  (1.803 m)      Intake/Output Summary (Last 24 hours) at 12/16/2021 1817 Last data filed at 12/16/2021 1707 Gross per 24 hour  Intake 1781.64 ml  Output --  Net 1781.64 ml   Filed Weights   12/16/21 0557 12/16/21 1523  Weight: 83.9 kg 84.9 kg    Telemetry    Sinus rhythm  - Personally Reviewed  ECG    None today  - Personally Reviewed  Physical Exam    General: Comfortable Head: Atraumatic, normal size  Eyes: PEERLA, EOMI  Neck: Supple, normal JVD Cardiac: Normal S1, S2; RRR; no murmurs, rubs, or gallops Lungs: Clear to auscultation bilaterally Abd: Soft, nontender, no hepatomegaly  Ext: warm, no edema Musculoskeletal: No deformities, BUE and BLE strength normal and equal Skin: Warm and dry, no rashes   Neuro: Alert and oriented to person, place, time, and situation, CNII-XII grossly intact, no focal deficits  Psych: Normal mood and affect   Labs    ChemistryNo results for input(s): NA, K, CL, CO2, GLUCOSE, BUN, CREATININE, CALCIUM, PROT, ALBUMIN, AST, ALT, ALKPHOS, BILITOT, GFRNONAA, GFRAA, ANIONGAP in the last 168 hours.   HematologyNo results for input(s): WBC, RBC, HGB, HCT, MCV, MCH, MCHC, RDW, PLT in the last 168 hours.  Cardiac EnzymesNo results for input(s): TROPONINI in the last 168 hours. No results for input(s): TROPIPOC in the last 168 hours.   BNPNo results for input(s): BNP, PROBNP in the last 168 hours.   DDimer No results for input(s):  DDIMER in the last 168 hours.   Radiology    CARDIAC CATHETERIZATION  Result Date: 12/16/2021   Mid LM to Ost LAD lesion is 40% stenosed with 40% stenosed side branch in Ost Cx.   Ost LAD to Prox LAD lesion is 95% stenosed.   Prox LAD to Mid LAD DES Stent is widely patent   Prox Cx- 1st Mrg DES STENT is widely patent   1st Diag lesion is 25% stenosed.   RPAV lesion is 50% stenosed.   -----------------------------------   The left ventricular systolic function is normal.  The left ventricular ejection fraction is 55-65% by visual estimate.   LV end diastolic pressure is normal.   There is no aortic valve stenosis. 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 ->  Location of the  stenosis and involvement of left main makes PCI of the LAD suboptimal especially in the patient who has pending surgery.  Would not be willing to stop antiplatelet agent after 3 to 6 months in the setting of Left Main Stenting. Only minimal RCA disease as well as downstream LCx and LAD disease. Preserved LVEF normal EDP. RECOMMENDATION Based on significance of ostial left main disease and level of his symptoms, I feel it is safer to admit him to the hospital for CVTS consultation.  We will start IV heparin. Titrate home dose of statin Restart BP meds Sliding scale insulin Glenetta Hew, MD  ECHOCARDIOGRAM COMPLETE  Result Date: 12/16/2021    ECHOCARDIOGRAM REPORT   Patient Name:   Ronald Petersen Date of Exam: 12/16/2021 Medical Rec #:  [id number]        Height:       71.0 in Accession #:    [id number]       Weight:       185.0 lb Date of Birth:  09-09-1956        BSA:          2.040 m Patient Age:    65 years         BP:           131/70 mmHg Patient Gender: M                HR:           60 bpm. Exam Location:  Inpatient Procedure: 2D Echo, Color Doppler and Cardiac Doppler Indications:    CAD  History:        Patient has prior history of Echocardiogram examinations. CAD;                 Risk Factors:Hypertension and Diabetes.  Sonographer:    Jyl Heinz Referring Phys: [id number] HARRELL O LIGHTFOOT IMPRESSIONS  1. Left ventricular ejection fraction, by estimation, is 60 to 65%. The left ventricle has normal function. The left ventricle has no regional wall motion abnormalities. There is moderate asymmetric left ventricular hypertrophy of the basal-septal segment. Left ventricular diastolic parameters are indeterminate.  2. Right ventricular systolic function is normal. The right ventricular size is normal. There is mildly elevated pulmonary artery systolic pressure. The estimated right ventricular systolic pressure is 87.5 mmHg.  3. Left atrial size was mildly dilated.  4. The mitral valve is normal in  structure. Trivial mitral valve regurgitation. No evidence of mitral stenosis.  5. The aortic valve is tricuspid. Aortic valve regurgitation is trivial. Mild aortic valve stenosis. Vmax 2.2  m/s, MG 20mmHg, AVA 2.0 cm^2, DI 0.54  6. The inferior vena cava is dilated in size with <50% respiratory variability, suggesting right atrial pressure of 15 mmHg. FINDINGS  Left Ventricle: Left ventricular ejection fraction, by estimation, is 60 to 65%. The left ventricle has normal function. The left ventricle has no regional wall motion abnormalities. The left ventricular internal cavity size was normal in size. There is  moderate asymmetric left ventricular hypertrophy of the basal-septal segment. Left ventricular diastolic parameters are indeterminate. Right Ventricle: The right ventricular size is normal. No increase in right ventricular wall thickness. Right ventricular systolic function is normal. There is mildly elevated pulmonary artery systolic pressure. The tricuspid regurgitant velocity is 2.35  m/s, and with an assumed right atrial pressure of 15 mmHg, the estimated right ventricular systolic pressure is 40.9 mmHg. Left Atrium: Left atrial size was mildly dilated. Right Atrium: Right atrial size was normal in size. Pericardium: There is no evidence of pericardial effusion. Mitral Valve: The mitral valve is normal in structure. Trivial mitral valve regurgitation. No evidence of mitral valve stenosis. Tricuspid Valve: The tricuspid valve is normal in structure. Tricuspid valve regurgitation is trivial. Aortic Valve: The aortic valve is tricuspid. Aortic valve regurgitation is trivial. Mild aortic stenosis is present. Aortic valve mean gradient measures 10.0 mmHg. Aortic valve peak gradient measures 18.1 mmHg. Aortic valve area, by VTI measures 2.28 cm. Pulmonic Valve: The pulmonic valve was not well visualized. Pulmonic valve regurgitation is trivial. Aorta: The aortic root and ascending aorta are structurally  normal, with no evidence of dilitation. Venous: The inferior vena cava is dilated in size with less than 50% respiratory variability, suggesting right atrial pressure of 15 mmHg. IAS/Shunts: The interatrial septum was not well visualized.  LEFT VENTRICLE PLAX 2D LVIDd:         5.40 cm      Diastology LVIDs:         3.10 cm      LV e' medial:    5.98 cm/s LV PW:         1.00 cm      LV E/e' medial:  13.9 LV IVS:        1.40 cm      LV e' lateral:   8.38 cm/s LVOT diam:     2.20 cm      LV E/e' lateral: 9.9 LV SV:         112 LV SV Index:   55 LVOT Area:     3.80 cm  LV Volumes (MOD) LV vol d, MOD A2C: 138.0 ml LV vol d, MOD A4C: 145.0 ml LV vol s, MOD A2C: 57.7 ml LV vol s, MOD A4C: 55.0 ml LV SV MOD A2C:     80.3 ml LV SV MOD A4C:     145.0 ml LV SV MOD BP:      93.8 ml RIGHT VENTRICLE             IVC RV Basal diam:  4.50 cm     IVC diam: 2.20 cm RV Mid diam:    2.90 cm RV S prime:     16.00 cm/s TAPSE (M-mode): 4.1 cm LEFT ATRIUM             Index        RIGHT ATRIUM           Index LA diam:        3.90 cm 1.91 cm/m   RA Area:  17.70 cm LA Vol (A2C):   69.9 ml 34.27 ml/m  RA Volume:   46.50 ml  22.80 ml/m LA Vol (A4C):   69.8 ml 34.22 ml/m LA Biplane Vol: 70.9 ml 34.76 ml/m  AORTIC VALVE AV Area (Vmax):    2.21 cm AV Area (Vmean):   2.26 cm AV Area (VTI):     2.28 cm AV Vmax:           212.67 cm/s AV Vmean:          151.667 cm/s AV VTI:            0.492 m AV Peak Grad:      18.1 mmHg AV Mean Grad:      10.0 mmHg LVOT Vmax:         123.50 cm/s LVOT Vmean:        90.350 cm/s LVOT VTI:          0.294 m LVOT/AV VTI ratio: 0.60  AORTA Ao Root diam: 3.50 cm Ao Asc diam:  3.20 cm MITRAL VALVE               TRICUSPID VALVE MV Area (PHT): 4.71 cm    TR Peak grad:   22.1 mmHg MV Decel Time: 161 msec    TR Vmax:        235.00 cm/s MV E velocity: 83.20 cm/s MV A velocity: 77.10 cm/s  SHUNTS MV E/A ratio:  1.08        Systemic VTI:  0.29 m                            Systemic Diam: 2.20 cm Oswaldo Milian MD  Electronically signed by Oswaldo Milian MD Signature Date/Time: 12/16/2021/2:26:11 PM    Final     Cardiac Studies   LHC 12/16/2021   Mid LM to Ost LAD lesion is 40% stenosed with 40% stenosed side branch in Ost Cx.   Ost LAD to Prox LAD lesion is 95% stenosed.   Prox LAD to Mid LAD DES Stent is widely patent   Prox Cx- 1st Mrg DES STENT is widely patent   1st Diag lesion is 25% stenosed.   RPAV lesion is 50% stenosed.   -----------------------------------   The left ventricular systolic function is normal.  The left ventricular ejection fraction is 55-65% by visual estimate.   LV end diastolic pressure is normal.   There is no aortic valve stenosis.   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 ->   Location of the stenosis and involvement of left main makes PCI of the LAD suboptimal especially in the patient who has pending surgery.  Would not be willing to stop antiplatelet agent after 3 to 6 months in the setting of Left Main Stenting. Only minimal RCA disease as well as downstream LCx and LAD disease. Preserved LVEF normal EDP.   RECOMMENDATION Based on significance of ostial left main disease and level of his symptoms, I feel it is safer to admit him to the hospital for CVTS consultation.  We will start IV heparin. Titrate home dose of statin Restart BP meds Sliding scale insulin  Glenetta Hew, MD    TTE 12/16/2021 IMPRESSIONS   1. Left ventricular ejection fraction, by estimation, is 60 to 65%. The left ventricle has normal function. The left ventricle has no regional wall  motion abnormalities. There is moderate asymmetric left ventricular hypertrophy of the basal-septal  segment. Left ventricular diastolic parameters are indeterminate.   2. Right ventricular systolic function is normal. The right ventricular size is normal. There is mildly elevated  pulmonary artery systolic pressure. The estimated right ventricular systolic pressure is 22.9 mmHg.   3. Left atrial size was mildly dilated.   4. The mitral valve is normal in structure. Trivial mitral valve  regurgitation. No evidence of mitral stenosis.   5. The aortic valve is tricuspid. Aortic valve regurgitation is trivial.  Mild aortic valve stenosis. Vmax 2.2 m/s, MG 89mmHg, AVA 2.0 cm^2, DI 0.54   6. The inferior vena cava is dilated in size with <50% respiratory  variability, suggesting right atrial pressure of 15 mmHg.   FINDINGS   Left Ventricle: Left ventricular ejection fraction, by estimation, is 60  to 65%. The left ventricle has normal function. The left ventricle has no  regional wall motion abnormalities. The left ventricular internal cavity  size was normal in size. There is   moderate asymmetric left ventricular hypertrophy of the basal-septal  segment. Left ventricular diastolic parameters are indeterminate.   Right Ventricle: The right ventricular size is normal. No increase in  right ventricular wall thickness. Right ventricular systolic function is  normal. There is mildly elevated pulmonary artery systolic pressure. The  tricuspid regurgitant velocity is 2.35   m/s, and with an assumed right atrial pressure of 15 mmHg, the estimated  right ventricular systolic pressure is 79.8 mmHg.   Left Atrium: Left atrial size was mildly dilated.   Right Atrium: Right atrial size was normal in size.   Pericardium: There is no evidence of pericardial effusion.   Mitral Valve: The mitral valve is normal in structure. Trivial mitral  valve regurgitation. No evidence of mitral valve stenosis.   Tricuspid Valve: The tricuspid valve is normal in structure. Tricuspid  valve regurgitation is trivial.   Aortic Valve: The aortic valve is tricuspid. Aortic valve regurgitation is  trivial. Mild aortic stenosis is present. Aortic valve mean gradient  measures 10.0 mmHg. Aortic  valve peak gradient measures 18.1 mmHg. Aortic  valve area, by VTI measures 2.28  cm.   Pulmonic Valve: The pulmonic valve was not well visualized. Pulmonic valve  regurgitation is trivial.   Aorta: The aortic root and ascending aorta are structurally normal, with  no evidence of dilitation.   Venous: The inferior vena cava is dilated in size with less than 50%  respiratory variability, suggesting right atrial pressure of 15 mmHg.   IAS/Shunts: The interatrial septum was not well visualized.   Patient Profile     65 y.o. male with history of CAD s/p stent presented for an elective LHC with severe LAD stenosis.   Assessment & Plan    CAD s/p PCI to LCX , LHC today with severe LAD stenosis - given nature of the disease it is beneficial to have the patient be evaluated by CT surgery. NO brillinta please. Continue with heparin gtt, Aspirin and antianginals with Renaxa and Amlodipine.  Blood pressure is acceptable- continue with current regimen. Hyperlipidemia - continue on current statin dose OSA - continue with cpap at night.  DM - SSI    For questions or updates, please contact [place] Please consult [email or web address] for contact info under Cardiology/STEMI.      Rolly Pancake, DO  12/16/2021, 6:17 PM

## 2021-12-16 NOTE — Consult Note (Addendum)
[address]       [place] [address]             [place] Record #[id number] Date of Birth: January 30, 1956  Referring: Dr. Ellyn Hack, MD Primary Care: [name], Alferd Apa, DO Primary [place], DO  Chief Complaint: Chest pain with exertion Reason for consultation: Coronary artery disease   History of Present Illness:     This is a 65 year old male with a past medical history of coronary artery disease (previous DES stents x 2), hypertension, hyperlipidemia, diabetes mellitus, neuropathy, OSA, remote tobacco abuse who has been having intermittent episodes of chest pain with exertion. Associated with this was nausea. He denies shortness of breath, diaphoresis, syncope. Per patient, he thought it might be his "reflux acting up" so he took extra Tums. These did not help so he took a Nitro. Of note, he need cervical fusion C5-6 and C6-7. He was in the process of getting cardiology clearance for this when he experienced intermittent, worsening chest pain. He presented to [facility] on 12/16/2021 in order to undergo a cardiac catheterization (via right radial artery). Patient is on Brillinta and had a dose earlier this am. Cardiac catheterization results showed 40% left main stenosis, ostial to proximal LAD with a 95% stenosis, proximal LAD to mid LAD DES stent is widely patent, proximal Circumflex to OM1 DES stent is widely patent, and RPAV lesion is 50% stenosed. Echo ordered; await results. Dr. Kipp Brood has been consulted for consideration of coronary artery bypass grafting surgery. At the time of my exam, patient is in no acute distress and vital signs are stable.   Current Activity/ Functional Status: Patient is independent with mobility/ambulation, transfers, ADL's, IADL's.   Zubrod Score: At the time of surgery this patients most appropriate activity status/level should be described as: []     0    Normal  activity, no symptoms [x]     1    Restricted in physical strenuous activity but ambulatory, able to do out light work []     2    Ambulatory and capable of self care, unable to do work activities, up and about more than 50%  Of the time                            []     3    Only limited self care, in bed greater than 50% of waking hours []     4    Completely disabled, no self care, confined to bed or chair []     5    Moribund  Past Medical History:  Diagnosis Date   Anxiety    Arthritis    Cancer ([place])    Complication of anesthesia    aspirated with back surgery at age 28   Depression    Diabetes mellitus Type II    GERD (gastroesophageal reflux disease)    Hyperlipidemia    Hypertension    Neuromuscular disorder ([place])    NEUROPATHY   Right rotator cuff tear 11/23/2018   Sleep apnea    no CPAP    Past Surgical History:  Procedure Laterality Date   APPENDECTOMY     ARTHOSCOPIC ROTAOR CUFF REPAIR Right 11/23/2018   Procedure: ARTHROSCOPIC ROTATOR CUFF REPAIR;  Surgeon: [name], MD;  Location: [place];  Service: Orthopedics;  Laterality: Right;  CHOLECYSTECTOMY     CORONARY STENT INTERVENTION N/A 11/19/2020   Procedure: CORONARY STENT INTERVENTION;  Surgeon: Nelva Bush, MD;  Location: [place] [facility];  Service: Cardiovascular;  Laterality: N/A;   INTRAVASCULAR ULTRASOUND/IVUS N/A 11/19/2020   Procedure: Intravascular Ultrasound/IVUS;  Surgeon: Nelva Bush, MD;  Location: [place] [facility];  Service: Cardiovascular;  Laterality: N/A;   LEFT HEART CATH AND CORONARY ANGIOGRAPHY N/A 11/19/2020   Procedure: LEFT HEART CATH AND CORONARY ANGIOGRAPHY;  Surgeon: Nelva Bush, MD;  Location: [place] [facility];  Service: Cardiovascular;  Laterality: N/A;   LEFT HEART CATH AND CORONARY ANGIOGRAPHY N/A 12/24/2020   Procedure: LEFT HEART CATH AND CORONARY ANGIOGRAPHY;  Surgeon: Martinique, Peter M, MD;  Location: [place] [facility];  Service:  Cardiovascular;  Laterality: N/A;   LUMBAR LAMINECTOMY     SHOULDER ARTHROSCOPY WITH ROTATOR CUFF REPAIR AND SUBACROMIAL DECOMPRESSION Right 11/23/2018   Procedure: SHOULDER ARTHROSCOPY WITH ROTATOR CUFF REPAIR AND SUBACROMIAL DECOMPRESSION;  Surgeon: [name], MD;  Location: [place];  Service: Orthopedics;  Laterality: Right;    Social History   Tobacco Use  Smoking Status Former   Years: 16.00   Types: Cigarettes   Quit date: 12/29/1985   Years since quitting: 35.9  Smokeless Tobacco Never    Social History   Substance and Sexual Activity  Alcohol Use Yes   Alcohol/week: Patient quit drinking last November   Types: Previously, he drank 4 - 5 Cans of beer per week   Comment: 5 times per week     Allergies  Allergen Reactions   Niacin-causes extreme flushing Anaphylaxis    Current Facility-Administered Medications  Medication Dose Route Frequency Provider Last Rate Last Admin   0.9 %  sodium chloride infusion  250 mL Intravenous PRN Tobb, Kardie, DO       0.9 %  sodium chloride infusion   Intravenous Continuous Leonie Man, MD 75 mL/hr at 12/16/21 0910 New Bag at 12/16/21 0910   0.9% sodium chloride infusion  1 mL/kg/hr Intravenous Continuous Tobb, Kardie, DO 84.4 mL/hr at 12/16/21 0728 1 mL/kg/hr at 12/16/21 3614   aspirin chewable tablet 81 mg  81 mg Oral Pre-Cath Tobb, Kardie, DO       fentaNYL (SUBLIMAZE) injection    PRN Leonie Man, MD   25 mcg at 12/16/21 0827   Heparin (Porcine) in NaCl 2000-0.9 UNIT/L-% SOLN    PRN Leonie Man, MD   1,000 mL at 12/16/21 0828   heparin ADULT infusion 100 units/mL (25000 units/264mL)  1,200 Units/hr Intravenous Continuous Karren Cobble, RPH       heparin sodium (porcine) injection    PRN Leonie Man, MD   4,000 Units at 12/16/21 0830   hydrALAZINE (APRESOLINE) injection 10 mg  10 mg Intravenous Q20 Min PRN Leonie Man, MD       iohexol (OMNIPAQUE) 350 MG/ML injection    PRN  Leonie Man, MD   50 mL at 12/16/21 0846   labetalol (NORMODYNE) injection 10 mg  10 mg Intravenous Q10 min PRN Leonie Man, MD       lidocaine (PF) (XYLOCAINE) 1 % injection    PRN Leonie Man, MD   2 mL at 12/16/21 0828   midazolam (VERSED) injection    PRN Leonie Man, MD   1 mg at 12/16/21 0827   morphine 2 MG/ML injection 2 mg  2 mg Intravenous Q6H PRN Leonie Man, MD  oxyCODONE (Oxy IR/ROXICODONE) immediate release tablet 5 mg  5 mg Oral Q6H PRN Leonie Man, MD       Radial Cocktail/Verapamil only    PRN Leonie Man, MD   10 mL at 12/16/21 0829   sodium chloride flush (NS) 0.9 % injection 3 mL  3 mL Intravenous Q12H Tobb, Kardie, DO       sodium chloride flush (NS) 0.9 % injection 3 mL  3 mL Intravenous PRN Tobb, Kardie, DO        Medications Prior to Admission  Medication Sig Dispense Refill Last Dose   acetaminophen (TYLENOL) 650 MG CR tablet Take 1,300 mg by mouth every 8 (eight) hours as needed for pain.   Past Month   aspirin EC 81 MG tablet Take 1 tablet (81 mg total) by mouth daily. Swallow whole. 90 tablet 3 12/16/2021 at 0430   Cyanocobalamin (B-12) 2500 MCG TABS Take 2,500 mcg by mouth daily.   12/15/2021   dicyclomine (BENTYL) 10 MG capsule Take 1 capsule (10 mg total) by mouth every 8 (eight) hours as needed for spasms. 30 capsule 1 Past Week   ELDERBERRY PO Take 300 mg by mouth daily.   Past Week   fenofibrate 160 MG tablet TAKE 1 TABLET (160 MG TOTAL) BY MOUTH DAILY. 90 tablet 1 Past Week   furosemide (LASIX) 40 MG tablet TAKE 1 TABLET (40 MG TOTAL) BY MOUTH EVERY OTHER DAY. 90 tablet 3 Past Week   gabapentin (NEURONTIN) 800 MG tablet Take 1 tablet (800 mg total) by mouth 3 (three) times daily. 270 tablet 2 Past Week   lisinopril (ZESTRIL) 5 MG tablet TAKE 1 TABLET (5 MG TOTAL) BY MOUTH DAILY. 100 tablet 0 Past Week   Magnesium Citrate 200 MG TABS Take 400 mg by mouth daily.   Past Week   metFORMIN (GLUCOPHAGE-XR) 500 MG 24 hr tablet  Take 1 tablet (500 mg total) by mouth daily with breakfast. 90 tablet 3 Past Week   nitroGLYCERIN (NITROSTAT) 0.4 MG SL tablet PLACE 1 TABLET (0.4 MG TOTAL) UNDER THE TONGUE EVERY 5 (FIVE) MINUTES AS NEEDED FOR CHEST PAIN. 90 tablet 3 12/15/2021   ondansetron (ZOFRAN) 4 MG tablet Take 1 tablet (4 mg total) by mouth every 8 (eight) hours as needed. 20 tablet 2 Past Week   pantoprazole (PROTONIX) 40 MG tablet TAKE 1 TABLET (40 MG TOTAL) BY MOUTH TWICE A DAY 60 tablet 3 12/15/2021   polyethylene glycol powder (GLYCOLAX/MIRALAX) 17 GM/SCOOP powder Take 17 g by mouth daily. Increase to twice a day as needed (Patient taking differently: Take 17 g by mouth 2 (two) times daily as needed for moderate constipation.) 510 g 1 Past Week   potassium chloride SA (KLOR-CON) 20 MEQ tablet TAKE 1 TABLET (20 MEQ TOTAL) BY MOUTH EVERY OTHER DAY. 90 tablet 3 Past Week   ranolazine (RANEXA) 1000 MG SR tablet TAKE 1 TABLET (1,000 MG TOTAL) BY MOUTH 2 (TWO) TIMES DAILY. 60 tablet 3 12/15/2021   rosuvastatin (CRESTOR) 40 MG tablet TAKE 1 TABLET BY MOUTH ONCE DAILY 90 tablet 3 Past Week   sertraline (ZOLOFT) 100 MG tablet TAKE ONE AND ONE-HALF TABLET BY MOUTH ONCE DAILY 135 tablet 3 12/15/2021   ticagrelor (BRILINTA) 90 MG TABS tablet Take 1 tablet (90 mg total) by mouth 2 (two) times daily. 36 tablet 0 12/15/2021   tiZANidine (ZANAFLEX) 4 MG tablet TAKE 1 TABLET (4 MG TOTAL) BY MOUTH EVERY 6 (SIX) HOURS AS NEEDED FOR MUSCLE SPASMS. 30 tablet  1 12/15/2021   traZODone (DESYREL) 50 MG tablet TAKE 1/2-1 TABLET (25-50 MG TOTAL) BY MOUTH AT BEDTIME AS NEEDED FOR SLEEP. (Patient taking differently: Take 50 mg by mouth at bedtime.) 30 tablet 3 12/15/2021   vitamin E 180 MG (400 UNITS) capsule Take 1,600 Units by mouth daily.   Past Week   amLODipine (NORVASC) 2.5 MG tablet Take 1 tablet (2.5 mg total) by mouth daily. 90 tablet 3    Continuous Blood Gluc Receiver (FREESTYLE LIBRE 2 READER) DEVI Use as directed to check blood sugar 1  each 0    Continuous Blood Gluc Sensor (FREESTYLE LIBRE 2 SENSOR) MISC Use to check blood sugar, change every 14 days 2 each 6    glucose blood test strip USE AS INSTRUCTED TO CHECK BLOOD SUGAR ONCE A DAY 100 strip 12    oxyCODONE-acetaminophen (PERCOCET/ROXICET) 5-325 MG tablet TAKE 1 TABLET BY MOUTH EVERY 4 (FOUR) HOURS AS NEEDED FOR SEVERE PAIN. (Patient not taking: Reported on 12/09/2021) 120 tablet 0 Completed Course   ticagrelor (BRILINTA) 90 MG TABS tablet TAKE 1 TABLET (90 MG TOTAL) BY MOUTH TWO TIMES DAILY. (Patient not taking: Reported on 12/11/2021) 180 tablet 1 Not Taking    Family History  Problem Relation Age of Onset   COPD Mother    Stroke Father    Ovarian cancer Sister    Stomach cancer Sister    Heart disease Sister        MI   Heart disease Sister        MI   Stomach cancer Maternal Grandmother    Alcohol abuse Other    Depression Other    Arthritis Other    Hypertension Other    Coronary artery disease Other    Ovarian cancer Other        neice   Ovarian cancer Other        neice   Colon polyps Neg Hx    Colon cancer Neg Hx   Patient has 3 sisters (2 of whom are deceased, one from ovarian and one from kidney cancer) and one brother Patient is married and does not work  Review of Systems:     Cardiac Review of Systems: Y or  [  N  ]= no  Chest Pain [  Y-previously, not at time of being seen ]  Resting SOB [  N ] Exertional SOB  [ N ]     Pedal Edema [ N  ]     Syncope  [ N ]   Presyncope [ N  ]  General Review of Systems: [Y] = yes [ N ]=no Constitional:  nausea Aqua.Slicker  ]; night sweats [N  ]; fever N[  ]; or chills Aqua.Slicker  ]                                                                Eye :  Amaurosis fugax[ N ]; Resp: cough [  N];  wheezing[ N ];  hemoptysis[ N ];  GI:   vomiting[ N ];  dysphagia[  N]; ];hematochezia [ N ]; heartburn[  Y];  GU:  hematuria[ N ];                Musculosketetal: neck pain[ Y ];  Heme/Lymph:  anemia[ N ];  Neuro: TIA[  N];   headaches[  ];  stroke[ ] ;  vertigo[ N ];  seizures[  N];     Endocrine: diabetes[ Y ];                  Physical Exam: BP 135/67    Pulse (!) 57    Temp 98 F (36.7 C) (Oral)    Resp 14    Ht 5\' 11"  (1.803 m)    Wt 83.9 kg    SpO2 99%    BMI 25.80 kg/m    General appearance: alert, cooperative, and no distress Head: Normocephalic, without obvious abnormality, atraumatic Neck: no carotid bruit, no JVD, and supple, symmetrical, trachea midline Resp: clear to auscultation bilaterally Cardio: RRR, no murmur GI: Soft, non tender, bowel sounds present Extremities: No LE edema. Feet warm bilaterally Neurologic: Grossly normal  Diagnostic Studies & Laboratory data:     Recent Radiology Findings:   CARDIAC CATHETERIZATION  Result Date: 12/16/2021   Mid LM to Ost LAD lesion is 40% stenosed with 40% stenosed side branch in Ost Cx.   Ost LAD to Prox LAD lesion is 95% stenosed.   Prox LAD to Mid LAD DES Stent is widely patent   Prox Cx- 1st Mrg DES STENT is widely patent   1st Diag lesion is 25% stenosed.   RPAV lesion is 50% stenosed.   -----------------------------------   The left ventricular systolic function is normal.  The left ventricular ejection fraction is 55-65% by visual estimate.   LV end diastolic pressure is normal.   There is no aortic valve stenosis. 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 ->  Location of the stenosis and involvement of left main makes PCI of the LAD suboptimal especially in the patient who has pending surgery.  Would not be willing to stop antiplatelet agent after 3 to 6 months in the setting of Left Main Stenting. Only minimal RCA disease as well as downstream LCx and LAD disease. Preserved LVEF normal EDP. RECOMMENDATION Based on significance of ostial left main disease and level of his symptoms, I feel it is safer to admit him to the hospital  for CVTS consultation.  We will start IV heparin. Titrate home dose of statin Restart BP meds Sliding scale insulin Glenetta Hew, MD   Diagnostic Dominance: Right Intervention  I have independently reviewed the above radiologic studies and discussed with the patient   Recent Lab Findings: Lab Results  Component Value Date   WBC 4.9 12/09/2021   HGB 13.5 12/09/2021   HCT 39.3 12/09/2021   PLT 176 12/09/2021   GLUCOSE 136 (H) 12/09/2021   CHOL 105 12/02/2021   TRIG 111.0 12/02/2021   HDL 46.40 12/02/2021   LDLDIRECT 93.0 05/31/2020   LDLCALC 36 12/02/2021   ALT 12 12/02/2021   AST 16 12/02/2021   NA 139 12/09/2021   K 4.3 12/09/2021   CL 102 12/09/2021   CREATININE 0.95 12/09/2021   BUN 16 12/09/2021   CO2 25 12/09/2021   TSH 1.82 02/26/2021   HGBA1C 6.3 12/02/2021   Assessment / Plan:   Coronary artery disease-Last took Brillinta earlier this am;needs washout. Dr. Kipp Brood to determine timing of coronary artery bypass grafting surgery. On Heparin drip. 2. History of diabetes mellitus (Type II)-HGA1C 12/05 6.3. On Metformin XR prior to admission 3. History of hypertension-on Lisinopril  5 mg daily prior to admission 4. History of hyperlipidemia-on Crestor 40 mg daily prior to admission 5. History of OSA-CPAP at night 6. History of Severe left foraminal stenosis at C5-6 and moderate-to-severe left foraminal stenosis at C6-7-will need surgery once recovered from heart surgery  7. History of neuropathy-on Neurontin 800 mg tid  I  spent 15 minutes counseling the patient face to face.   Lars Pinks PA-C 12/16/2021 10:41 AM    Agree with above.  This is a 65 year old gentleman was admitted following elective left heart cath was found to have severe left main coronary artery disease.  I personally reviewed his left heart cath.  His LAD lesion is quite tight.  He does have a 40% left main stenosis.  All of his stents are patent.  He does have moderate disease in the PDA,  but this likely would not require any bypass at this point.  His echocardiogram was reviewed, and he has preserved biventricular function and no significant valvular disease.  He has been on Brilinta and this will need for washout prior to proceeding with surgery.  Amaura Authier Bary Leriche

## 2021-12-16 NOTE — Progress Notes (Signed)
ANTICOAGULATION CONSULT NOTE - Initial Consult  Pharmacy Consult for Heparin Indication: chest pain/ACS with CAD on cath  Allergies  Allergen Reactions   Niacin Anaphylaxis    Patient Measurements: Height: 5\' 11"  (180.3 cm) Weight: 83.9 kg (185 lb) IBW/kg (Calculated) : 75.3 Heparin Dosing Weight: 84 kg  Vital Signs: Temp: 98 F (36.7 C) (12/19 0557) Temp Source: Oral (12/19 0557) BP: 135/67 (12/19 0900) Pulse Rate: 57 (12/19 0900)  Labs: No results for input(s): HGB, HCT, PLT, APTT, LABPROT, INR, HEPARINUNFRC, HEPRLOWMOCWT, CREATININE, CKTOTAL, CKMB, TROPONINIHS in the last 72 hours.  Estimated Creatinine Clearance: 81.5 mL/min (by C-G formula based on SCr of 0.95 mg/dL).   Medical History: Past Medical History:  Diagnosis Date   Anxiety    Arthritis    Cancer ([place])    Complication of anesthesia    aspirated with back surgery at age 62   Depression    Diabetes mellitus Type II    GERD (gastroesophageal reflux disease)    Hyperlipidemia    Hypertension    Neuromuscular disorder ([place])    NEUROPATHY   Right rotator cuff tear 11/23/2018   Sleep apnea    no CPAP    Assessment: Elective cath 12/19. Known h/o CAD. - 12/19: Cath: Severe bifurcation disease involving distal Left Main with at least 40% stenosis going into ostial LCx with a culprit lesion being ostial LAD 95% stenosis. Preserved EF  Anticoag: Know h/o CAD. 12/19: Radial sheath out 0850. Start IV heparin at 1650 time.  Goal of Therapy:  Heparin level 0.3-0.7 units/ml Monitor platelets by anticoagulation protocol: Yes   Plan:  CVTs consult Start IV heparin at 1650 (time) with no bolus at 1200 units/hr Will check heparin level 6 hrs after heparin starts Daily HL and CBC   Moriya Mitchell S. Alford Highland, PharmD, BCPS Clinical Staff Pharmacist [email or web address] Alford Highland, [employer] 12/16/2021,10:23 AM

## 2021-12-17 ENCOUNTER — Encounter (HOSPITAL_COMMUNITY): Payer: Self-pay | Admitting: Cardiology

## 2021-12-17 ENCOUNTER — Other Ambulatory Visit (HOSPITAL_COMMUNITY): Payer: Medicare HMO

## 2021-12-17 LAB — BASIC METABOLIC PANEL
Anion gap: 7 (ref 5–15)
BUN: 16 mg/dL (ref 8–23)
CO2: 25 mmol/L (ref 22–32)
Calcium: 9.4 mg/dL (ref 8.9–10.3)
Chloride: 103 mmol/L (ref 98–111)
Creatinine, Ser: 0.87 mg/dL (ref 0.61–1.24)
GFR, Estimated: >60 mL/min (ref >60)
Glucose, Bld: 148 mg/dL — ABNORMAL HIGH (ref 70–99)
Potassium: 3.7 mmol/L (ref 3.5–5.1)
Sodium: 135 mmol/L (ref 135–145)

## 2021-12-17 LAB — CBC
HCT: 38 % — ABNORMAL LOW (ref 39.0–52.0)
Hemoglobin: 12.8 g/dL — ABNORMAL LOW (ref 13.0–17.0)
MCH: 29.2 pg (ref 26.0–34.0)
MCHC: 33.7 g/dL (ref 30.0–36.0)
MCV: 86.6 fL (ref 80.0–100.0)
Platelets: 129 10*3/uL — ABNORMAL LOW (ref 150–400)
RBC: 4.39 MIL/uL (ref 4.22–5.81)
RDW: 12.6 % (ref 11.5–15.5)
WBC: 4.3 10*3/uL (ref 4.0–10.5)
nRBC: 0 % (ref 0.0–0.2)

## 2021-12-17 LAB — HEPARIN LEVEL (UNFRACTIONATED)
Heparin Unfractionated: 0.37 IU/mL (ref 0.30–0.70)
Heparin Unfractionated: 0.43 IU/mL (ref 0.30–0.70)

## 2021-12-17 LAB — GLUCOSE, CAPILLARY
Glucose-Capillary: 137 mg/dL — ABNORMAL HIGH (ref 70–99)
Glucose-Capillary: 105 mg/dL — ABNORMAL HIGH (ref 70–99)
Glucose-Capillary: 105 mg/dL — ABNORMAL HIGH (ref 70–99)
Glucose-Capillary: 190 mg/dL — ABNORMAL HIGH (ref 70–99)

## 2021-12-17 MED ORDER — NITROGLYCERIN 0.1 MG/HR TD PT24
0.1000 mg | MEDICATED_PATCH | Freq: Every day | TRANSDERMAL | Status: DC
Start: 2021-12-17 — End: 2021-12-24
  Administered 2021-12-20 – 2021-12-23 (×4): 0.1 mg via TRANSDERMAL
  Filled 2021-12-17 (×8): qty 1

## 2021-12-17 NOTE — Progress Notes (Signed)
°  Transition of Care [facility]) Screening Note   Patient Details  Name: Ronald Petersen Date of Birth: 1956-10-25   Transition of Care [facility]) CM/SW Contact:    Milas Gain, [place] Phone Number: 12/17/2021, 5:32 PM    Transition of Care Department [facility]) has reviewed patient and no TOC needs have been identified at this time. We will continue to monitor patient advancement through interdisciplinary progression rounds. If new patient transition needs arise, please place a TOC consult.

## 2021-12-17 NOTE — Progress Notes (Signed)
[facility] for Heparin Indication: CAD awaiting CABG Brief A/P: Heparin level subtherapeutic Increase Heparin rate Follow-up am labs.   Allergies  Allergen Reactions   Niacin Anaphylaxis    Patient Measurements: Height: 5\' 11"  (180.3 cm) Weight: 84.9 kg (187 lb 3.2 oz) IBW/kg (Calculated) : 75.3 Heparin Dosing Weight: 84 kg  Vital Signs: Temp: 97.9 F (36.6 C) (12/20 0300) Temp Source: Oral (12/20 0300) BP: 107/54 (12/20 0710) Pulse Rate: 51 (12/20 0710)  Labs: Recent Labs    12/16/21 2311 12/17/21 0717  HGB  --  12.8*  HCT  --  38.0*  PLT  --  129*  HEPARINUNFRC 0.19* 0.37     Estimated Creatinine Clearance: 81.5 mL/min (by C-G formula based on SCr of 0.95 mg/dL).   Assessment:  65 y.o. male with severe CAD s/p cath, awaiting CABG.  On IV heparin with level at goal this AM.  No overt bleeding or complications noted.  Goal of Therapy:  Heparin level 0.3-0.7 units/ml Monitor platelets by anticoagulation protocol: Yes   Plan:  Continue IV heparin at 1400 units/hr. Confirm heparin level in 8hrs. Daily heparin level and CBC. F/u plans for CABG.  [place] Crane, Roylene Reason, [facility] Clinical Pharmacist  12/17/2021 9:07 AM   [facility] pharmacy phone numbers are listed on [email or web address]

## 2021-12-17 NOTE — Progress Notes (Addendum)
[place] for Heparin Indication: CAD awaiting CABG Brief A/P: Heparin level subtherapeutic Increase Heparin rate Follow-up am labs.   Allergies  Allergen Reactions   Niacin Anaphylaxis    Patient Measurements: Height: 5\' 11"  (180.3 cm) Weight: 84.9 kg (187 lb 3.2 oz) IBW/kg (Calculated) : 75.3 Heparin Dosing Weight: 84 kg  Vital Signs: Temp: 97.9 F (36.6 C) (12/19 2027) Temp Source: Oral (12/19 2027) BP: 122/67 (12/19 2027) Pulse Rate: 56 (12/19 2027)  Labs: Recent Labs    12/16/21 2311  HEPARINUNFRC 0.19*    Estimated Creatinine Clearance: 81.5 mL/min (by C-G formula based on SCr of 0.95 mg/dL).   Assessment:  65 y.o. male with severe CAD s/p cath, awaiting CABG, for heparin  Goal of Therapy:  Heparin level 0.3-0.7 units/ml Monitor platelets by anticoagulation protocol: Yes   Plan:  Increase Heparin 1400 units/hr Follow-up am labs.   Ronald Petersen, [place]onson Curb 12/17/2021,12:15 AM

## 2021-12-17 NOTE — Progress Notes (Signed)
ANTICOAGULATION CONSULT NOTE- follow-up  Pharmacy Consult for Heparin Indication: CAD awaiting CABG Brief A/P: Heparin level subtherapeutic Increase Heparin rate Follow-up am labs.   Allergies  Allergen Reactions   Niacin Anaphylaxis    Patient Measurements: Height: 5\' 11"  (180.3 cm) Weight: 84.9 kg (187 lb 3.2 oz) IBW/kg (Calculated) : 75.3 Heparin Dosing Weight: 84 kg  Vital Signs: Temp: 98.8 F (37.1 C) (12/20 1357) Temp Source: Oral (12/20 1357) BP: 122/66 (12/20 1357) Pulse Rate: 52 (12/20 1357)  Labs: Recent Labs    12/16/21 2311 12/17/21 0717 12/17/21 0934 12/17/21 1558  HGB  --  12.8*  --   --   HCT  --  38.0*  --   --   PLT  --  129*  --   --   HEPARINUNFRC 0.19* 0.37  --  0.43  CREATININE  --   --  0.87  --      Estimated Creatinine Clearance: 89 mL/min (by C-G formula based on SCr of 0.87 mg/dL).   Assessment:  65 y.o. male with severe CAD s/p cath, awaiting CABG.  On IV heparin with level at goal this AM.  No overt bleeding or complications noted.  Goal of Therapy:  Heparin level 0.3-0.7 units/ml Monitor platelets by anticoagulation protocol: Yes   Plan:  Continue IV heparin at 1400 units/hr. Next Heparin level with AM labs 12/18/2021. Daily heparin level and CBC. F/u plans for CABG.  Vaughan Basta BS, PharmD, BCPS Clinical Pharmacist  12/17/2021   5:20 PM   [facility] pharmacy phone numbers are listed on [email or web address]

## 2021-12-17 NOTE — Progress Notes (Signed)
Progress Note  Patient Name: DASAN HARDMAN Date of Encounter: 12/17/2021  Primary Cardiologist: Berniece Salines, DO   Subjective   Patient seen and examined at his bedside. He was awake when I arrived.  He offers no complaints.S/p LHC yesterday currently on heparin gtt.   Inpatient Medications    Scheduled Meds:  amLODipine  2.5 mg Oral Daily   aspirin EC  81 mg Oral Daily   fenofibrate  160 mg Oral Daily   furosemide  20 mg Oral Daily   gabapentin  800 mg Oral TID   insulin aspart  0-15 Units Subcutaneous TID WC   lisinopril  5 mg Oral Daily   magnesium oxide  400 mg Oral Daily   pantoprazole  40 mg Oral BID AC   polyethylene glycol  17 g Oral Daily   potassium chloride SA  40 mEq Oral Daily   ranolazine  1,000 mg Oral BID   rosuvastatin  40 mg Oral Daily   sertraline  150 mg Oral Daily   sodium chloride flush  3 mL Intravenous Q12H   sodium chloride flush  3 mL Intravenous Q12H   traZODone  50 mg Oral QHS   vitamin B-12  2,500 mcg Oral Daily   Vitamin E  1,600 Units Oral Daily   Continuous Infusions:  sodium chloride     heparin 1,400 Units/hr (12/17/21 0035)   PRN Meds: sodium chloride, acetaminophen, dicyclomine, morphine injection, nitroGLYCERIN, ondansetron (ZOFRAN) IV, oxyCODONE, sodium chloride flush, tiZANidine   Vital Signs    Vitals:   12/16/21 2027 12/17/21 0046 12/17/21 0300 12/17/21 0710  BP: 122/67 116/62 90/74 (!) 107/54  Pulse: (!) 56 (!) 57 (!) 52 (!) 51  Resp: 16 16    Temp: 97.9 F (36.6 C) 98.5 F (36.9 C) 97.9 F (36.6 C)   TempSrc: Oral Oral Oral   SpO2: 98% 96% 97% 96%  Weight:      Height:        Intake/Output Summary (Last 24 hours) at 12/17/2021 0943 Last data filed at 12/17/2021 7425 Gross per 24 hour  Intake 2141.64 ml  Output 1480 ml  Net 661.64 ml   Filed Weights   12/16/21 0557 12/16/21 1523  Weight: 83.9 kg 84.9 kg    Telemetry    Sinus rhythm - Personally Reviewed  ECG    None today  - Personally  Reviewed  Physical Exam    General: Comfortable Head: Atraumatic, normal size  Eyes: PEERLA, EOMI  Neck: Supple, normal JVD Cardiac: Normal S1, S2; RRR; no murmurs, rubs, or gallops Lungs: Clear to auscultation bilaterally Abd: Soft, nontender, no hepatomegaly  Ext: warm, no edema Musculoskeletal: No deformities, BUE and BLE strength normal and equal Skin: Warm and dry, no rashes   Neuro: Alert and oriented to person, place, time, and situation, CNII-XII grossly intact, no focal deficits  Psych: Normal mood and affect   Labs    ChemistryNo results for input(s): NA, K, CL, CO2, GLUCOSE, BUN, CREATININE, CALCIUM, PROT, ALBUMIN, AST, ALT, ALKPHOS, BILITOT, GFRNONAA, GFRAA, ANIONGAP in the last 168 hours.   Hematology Recent Labs  Lab 12/17/21 0717  WBC 4.3  RBC 4.39  HGB 12.8*  HCT 38.0*  MCV 86.6  MCH 29.2  MCHC 33.7  RDW 12.6  PLT 129*    Cardiac EnzymesNo results for input(s): TROPONINI in the last 168 hours. No results for input(s): TROPIPOC in the last 168 hours.   BNPNo results for input(s): BNP, PROBNP in the last 168  hours.   DDimer No results for input(s): DDIMER in the last 168 hours.   Radiology    CARDIAC CATHETERIZATION  Result Date: 12/16/2021   Mid LM to Ost LAD lesion is 40% stenosed with 40% stenosed side branch in Ost Cx.   Ost LAD to Prox LAD lesion is 95% stenosed.   Prox LAD to Mid LAD DES Stent is widely patent   Prox Cx- 1st Mrg DES STENT is widely patent   1st Diag lesion is 25% stenosed.   RPAV lesion is 50% stenosed.   -----------------------------------   The left ventricular systolic function is normal.  The left ventricular ejection fraction is 55-65% by visual estimate.   LV end diastolic pressure is normal.   There is no aortic valve stenosis. 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 ->  Location of the stenosis and  involvement of left main makes PCI of the LAD suboptimal especially in the patient who has pending surgery.  Would not be willing to stop antiplatelet agent after 3 to 6 months in the setting of Left Main Stenting. Only minimal RCA disease as well as downstream LCx and LAD disease. Preserved LVEF normal EDP. RECOMMENDATION Based on significance of ostial left main disease and level of his symptoms, I feel it is safer to admit him to the hospital for CVTS consultation.  We will start IV heparin. Titrate home dose of statin Restart BP meds Sliding scale insulin Glenetta Hew, MD  ECHOCARDIOGRAM COMPLETE  Result Date: 12/16/2021    ECHOCARDIOGRAM REPORT   Patient Name:   KEVAL NAM Date of Exam: 12/16/2021 Medical Rec #:  [id number]        Height:       71.0 in Accession #:    [id number]       Weight:       185.0 lb Date of Birth:  [birth year]        BSA:          2.040 m Patient Age:    65 years         BP:           131/70 mmHg Patient Gender: M                HR:           60 bpm. Exam Location:  Inpatient Procedure: 2D Echo, Color Doppler and Cardiac Doppler Indications:    CAD  History:        Patient has prior history of Echocardiogram examinations. CAD;                 Risk Factors:Hypertension and Diabetes.  Sonographer:    Jyl Heinz Referring Phys: [id number] HARRELL O LIGHTFOOT IMPRESSIONS  1. Left ventricular ejection fraction, by estimation, is 60 to 65%. The left ventricle has normal function. The left ventricle has no regional wall motion abnormalities. There is moderate asymmetric left ventricular hypertrophy of the basal-septal segment. Left ventricular diastolic parameters are indeterminate.  2. Right ventricular systolic function is normal. The right ventricular size is normal. There is mildly elevated pulmonary artery systolic pressure. The estimated right ventricular systolic pressure is 30.8 mmHg.  3. Left atrial size was mildly dilated.  4. The mitral valve is normal in structure. Trivial  mitral valve regurgitation. No evidence of mitral stenosis.  5. The aortic valve is tricuspid. Aortic valve regurgitation  is trivial. Mild aortic valve stenosis. Vmax 2.2 m/s, MG 28mmHg, AVA 2.0 cm^2, DI 0.54  6. The inferior vena cava is dilated in size with <50% respiratory variability, suggesting right atrial pressure of 15 mmHg. FINDINGS  Left Ventricle: Left ventricular ejection fraction, by estimation, is 60 to 65%. The left ventricle has normal function. The left ventricle has no regional wall motion abnormalities. The left ventricular internal cavity size was normal in size. There is  moderate asymmetric left ventricular hypertrophy of the basal-septal segment. Left ventricular diastolic parameters are indeterminate. Right Ventricle: The right ventricular size is normal. No increase in right ventricular wall thickness. Right ventricular systolic function is normal. There is mildly elevated pulmonary artery systolic pressure. The tricuspid regurgitant velocity is 2.35  m/s, and with an assumed right atrial pressure of 15 mmHg, the estimated right ventricular systolic pressure is 78.4 mmHg. Left Atrium: Left atrial size was mildly dilated. Right Atrium: Right atrial size was normal in size. Pericardium: There is no evidence of pericardial effusion. Mitral Valve: The mitral valve is normal in structure. Trivial mitral valve regurgitation. No evidence of mitral valve stenosis. Tricuspid Valve: The tricuspid valve is normal in structure. Tricuspid valve regurgitation is trivial. Aortic Valve: The aortic valve is tricuspid. Aortic valve regurgitation is trivial. Mild aortic stenosis is present. Aortic valve mean gradient measures 10.0 mmHg. Aortic valve peak gradient measures 18.1 mmHg. Aortic valve area, by VTI measures 2.28 cm. Pulmonic Valve: The pulmonic valve was not well visualized. Pulmonic valve regurgitation is trivial. Aorta: The aortic root and ascending aorta are structurally normal, with no evidence  of dilitation. Venous: The inferior vena cava is dilated in size with less than 50% respiratory variability, suggesting right atrial pressure of 15 mmHg. IAS/Shunts: The interatrial septum was not well visualized.  LEFT VENTRICLE PLAX 2D LVIDd:         5.40 cm      Diastology LVIDs:         3.10 cm      LV e' medial:    5.98 cm/s LV PW:         1.00 cm      LV E/e' medial:  13.9 LV IVS:        1.40 cm      LV e' lateral:   8.38 cm/s LVOT diam:     2.20 cm      LV E/e' lateral: 9.9 LV SV:         112 LV SV Index:   55 LVOT Area:     3.80 cm  LV Volumes (MOD) LV vol d, MOD A2C: 138.0 ml LV vol d, MOD A4C: 145.0 ml LV vol s, MOD A2C: 57.7 ml LV vol s, MOD A4C: 55.0 ml LV SV MOD A2C:     80.3 ml LV SV MOD A4C:     145.0 ml LV SV MOD BP:      93.8 ml RIGHT VENTRICLE             IVC RV Basal diam:  4.50 cm     IVC diam: 2.20 cm RV Mid diam:    2.90 cm RV S prime:     16.00 cm/s TAPSE (M-mode): 4.1 cm LEFT ATRIUM             Index        RIGHT ATRIUM           Index LA diam:        3.90 cm 1.91  cm/m   RA Area:     17.70 cm LA Vol (A2C):   69.9 ml 34.27 ml/m  RA Volume:   46.50 ml  22.80 ml/m LA Vol (A4C):   69.8 ml 34.22 ml/m LA Biplane Vol: 70.9 ml 34.76 ml/m  AORTIC VALVE AV Area (Vmax):    2.21 cm AV Area (Vmean):   2.26 cm AV Area (VTI):     2.28 cm AV Vmax:           212.67 cm/s AV Vmean:          151.667 cm/s AV VTI:            0.492 m AV Peak Grad:      18.1 mmHg AV Mean Grad:      10.0 mmHg LVOT Vmax:         123.50 cm/s LVOT Vmean:        90.350 cm/s LVOT VTI:          0.294 m LVOT/AV VTI ratio: 0.60  AORTA Ao Root diam: 3.50 cm Ao Asc diam:  3.20 cm MITRAL VALVE               TRICUSPID VALVE MV Area (PHT): 4.71 cm    TR Peak grad:   22.1 mmHg MV Decel Time: 161 msec    TR Vmax:        235.00 cm/s MV E velocity: 83.20 cm/s MV A velocity: 77.10 cm/s  SHUNTS MV E/A ratio:  1.08        Systemic VTI:  0.29 m                            Systemic Diam: 2.20 cm Oswaldo Milian MD Electronically signed by  Oswaldo Milian MD Signature Date/Time: 12/16/2021/2:26:11 PM    Final     Cardiac Studies   LHC 12/16/2021   Mid LM to Ost LAD lesion is 40% stenosed with 40% stenosed side branch in Ost Cx.   Ost LAD to Prox LAD lesion is 95% stenosed.   Prox LAD to Mid LAD DES Stent is widely patent   Prox Cx- 1st Mrg DES STENT is widely patent   1st Diag lesion is 25% stenosed.   RPAV lesion is 50% stenosed.   -----------------------------------   The left ventricular systolic function is normal.  The left ventricular ejection fraction is 55-65% by visual estimate.   LV end diastolic pressure is normal.   There is no aortic valve stenosis.   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 ->   Location of the stenosis and involvement of left main makes PCI of the LAD suboptimal especially in the patient who has pending surgery.  Would not be willing to stop antiplatelet agent after 3 to 6 months in the setting of Left Main Stenting. Only minimal RCA disease as well as downstream LCx and LAD disease. Preserved LVEF normal EDP.   RECOMMENDATION Based on significance of ostial left main disease and level of his symptoms, I feel it is safer to admit him to the hospital for CVTS consultation.  We will start IV heparin. Titrate home dose of statin Restart BP meds Sliding scale insulin  Glenetta Hew, MD    TTE 12/16/2021 IMPRESSIONS   1. Left ventricular ejection fraction, by estimation, is 60 to 65%. The left ventricle has  normal function. The left ventricle has no regional wall motion abnormalities. There is moderate asymmetric left ventricular hypertrophy of the basal-septal  segment. Left ventricular diastolic parameters are indeterminate.   2. Right ventricular systolic function is normal. The right ventricular size is normal. There is mildly elevated pulmonary artery systolic  pressure. The estimated right ventricular systolic pressure is 90.2 mmHg.   3. Left atrial size was mildly dilated.   4. The mitral valve is normal in structure. Trivial mitral valve  regurgitation. No evidence of mitral stenosis.   5. The aortic valve is tricuspid. Aortic valve regurgitation is trivial.  Mild aortic valve stenosis. Vmax 2.2 m/s, MG 28mmHg, AVA 2.0 cm^2, DI 0.54   6. The inferior vena cava is dilated in size with <50% respiratory  variability, suggesting right atrial pressure of 15 mmHg.   FINDINGS   Left Ventricle: Left ventricular ejection fraction, by estimation, is 60  to 65%. The left ventricle has normal function. The left ventricle has no  regional wall motion abnormalities. The left ventricular internal cavity  size was normal in size. There is   moderate asymmetric left ventricular hypertrophy of the basal-septal  segment. Left ventricular diastolic parameters are indeterminate.   Right Ventricle: The right ventricular size is normal. No increase in  right ventricular wall thickness. Right ventricular systolic function is  normal. There is mildly elevated pulmonary artery systolic pressure. The  tricuspid regurgitant velocity is 2.35   m/s, and with an assumed right atrial pressure of 15 mmHg, the estimated  right ventricular systolic pressure is 40.9 mmHg.   Left Atrium: Left atrial size was mildly dilated.   Right Atrium: Right atrial size was normal in size.   Pericardium: There is no evidence of pericardial effusion.   Mitral Valve: The mitral valve is normal in structure. Trivial mitral  valve regurgitation. No evidence of mitral valve stenosis.   Tricuspid Valve: The tricuspid valve is normal in structure. Tricuspid  valve regurgitation is trivial.   Aortic Valve: The aortic valve is tricuspid. Aortic valve regurgitation is  trivial. Mild aortic stenosis is present. Aortic valve mean gradient  measures 10.0 mmHg. Aortic valve peak gradient  measures 18.1 mmHg. Aortic  valve area, by VTI measures 2.28  cm.   Pulmonic Valve: The pulmonic valve was not well visualized. Pulmonic valve  regurgitation is trivial.   Aorta: The aortic root and ascending aorta are structurally normal, with  no evidence of dilitation.   Venous: The inferior vena cava is dilated in size with less than 50%  respiratory variability, suggesting right atrial pressure of 15 mmHg.   IAS/Shunts: The interatrial septum was not well visualized.   Patient Profile     65 y.o. male with history of CAD s/p stent presented for an elective LHC with severe LAD stenosis.   Assessment & Plan    CAD s/p PCI to LCX , LHC today with severe LAD stenosis - given nature of the disease it is beneficial to have the patient be evaluated by CT surgery- Plan for surgery next Tuesday. Continue to hold brillinta please.  Continue with heparin gtt, Aspirin and antianginals with Renaxa and Amlodipine.  Still with some chest tightness will add nitro patch.    Blood pressure is acceptable- continue with current regimen. Hyperlipidemia - continue on current statin dose OSA - continue with cpap at night.  DM - SSI    For questions or updates, please contact [facility] Please consult [email or web address] for contact info under Cardiology/STEMI.  Rolly Pancake, DO  12/17/2021, 9:43 AM

## 2021-12-17 NOTE — Progress Notes (Signed)
Pt sts he has been having off and on mild chest tightness in the bed, rates 1/10. He is not sure if it is his angina. Will hold ambulation for now, discussed with cardiology. Discussed IS (2250 ml), sternal precautions, mobility, and d/c planning. Gave [facility] materials.  [place], ACSM 9:54 AM 12/17/2021

## 2021-12-17 NOTE — Plan of Care (Signed)
Problem: Activity: Goal: Risk for activity intolerance will decrease Outcome: Progressing   Problem: Education: Goal: Knowledge of General Education information will improve Description: Including pain rating scale, medication(s)/side effects and non-pharmacologic comfort measures Outcome: Completed/Met   Problem: Health Behavior/Discharge Planning: Goal: Ability to manage health-related needs will improve Outcome: Completed/Met   Problem: Clinical Measurements: Goal: Ability to maintain clinical measurements within normal limits will improve Outcome: Completed/Met   Problem: Nutrition: Goal: Adequate nutrition will be maintained Outcome: Completed/Met   Problem: Elimination: Goal: Will not experience complications related to bowel motility Outcome: Completed/Met Goal: Will not experience complications related to urinary retention Outcome: Completed/Met   Problem: Pain Managment: Goal: General experience of comfort will improve Outcome: Completed/Met   Problem: Education: Goal: Understanding of CV disease, CV risk reduction, and recovery process will improve Outcome: Completed/Met   Problem: Cardiovascular: Goal: Vascular access site(s) Level 0-1 will be maintained Outcome: Completed/Met

## 2021-12-18 LAB — CBC
HCT: 37.7 % — ABNORMAL LOW (ref 39.0–52.0)
Hemoglobin: 12.8 g/dL — ABNORMAL LOW (ref 13.0–17.0)
MCH: 29.4 pg (ref 26.0–34.0)
MCHC: 34 g/dL (ref 30.0–36.0)
MCV: 86.5 fL (ref 80.0–100.0)
Platelets: 127 10*3/uL — ABNORMAL LOW (ref 150–400)
RBC: 4.36 MIL/uL (ref 4.22–5.81)
RDW: 12.6 % (ref 11.5–15.5)
WBC: 5 10*3/uL (ref 4.0–10.5)
nRBC: 0 % (ref 0.0–0.2)

## 2021-12-18 LAB — GLUCOSE, CAPILLARY
Glucose-Capillary: 120 mg/dL — ABNORMAL HIGH (ref 70–99)
Glucose-Capillary: 136 mg/dL — ABNORMAL HIGH (ref 70–99)
Glucose-Capillary: 138 mg/dL — ABNORMAL HIGH (ref 70–99)
Glucose-Capillary: 180 mg/dL — ABNORMAL HIGH (ref 70–99)

## 2021-12-18 LAB — HEPARIN LEVEL (UNFRACTIONATED): Heparin Unfractionated: 0.36 IU/mL (ref 0.30–0.70)

## 2021-12-18 NOTE — Progress Notes (Addendum)
Progress Note  Patient Name: Ronald Petersen Date of Encounter: 12/18/2021  [facility] Cardiologist: Godfrey Pick Tobb, DO   Subjective   No chest pain, no SOB, resting comfortably in bed   Inpatient Medications    Scheduled Meds:  amLODipine  2.5 mg Oral Daily   aspirin EC  81 mg Oral Daily   fenofibrate  160 mg Oral Daily   furosemide  20 mg Oral Daily   gabapentin  800 mg Oral TID   insulin aspart  0-15 Units Subcutaneous TID WC   lisinopril  5 mg Oral Daily   magnesium oxide  400 mg Oral Daily   nitroGLYCERIN  0.1 mg Transdermal Daily   pantoprazole  40 mg Oral BID AC   polyethylene glycol  17 g Oral Daily   potassium chloride SA  40 mEq Oral Daily   ranolazine  1,000 mg Oral BID   rosuvastatin  40 mg Oral Daily   sertraline  150 mg Oral Daily   sodium chloride flush  3 mL Intravenous Q12H   sodium chloride flush  3 mL Intravenous Q12H   traZODone  50 mg Oral QHS   vitamin B-12  2,500 mcg Oral Daily   Vitamin E  1,600 Units Oral Daily   Continuous Infusions:  sodium chloride     heparin 1,400 Units/hr (12/17/21 1109)   PRN Meds: sodium chloride, acetaminophen, dicyclomine, morphine injection, nitroGLYCERIN, ondansetron (ZOFRAN) IV, oxyCODONE, sodium chloride flush, tiZANidine   Vital Signs    Vitals:   12/17/21 0710 12/17/21 1357 12/17/21 1900 12/18/21 0626  BP: (!) 107/54 122/66 117/63 (!) 90/53  Pulse: (!) 51 (!) 52 (!) 58 (!) 54  Resp:  16 18 18   Temp:  98.8 F (37.1 C) 97.9 F (36.6 C) 97.9 F (36.6 C)  TempSrc:  Oral Oral Oral  SpO2: 96% 96% 99% 99%  Weight:      Height:        Intake/Output Summary (Last 24 hours) at 12/18/2021 0636 Last data filed at 12/18/2021 0500 Gross per 24 hour  Intake 581.3 ml  Output 1200 ml  Net -618.7 ml   Last 3 Weights 12/16/2021 12/16/2021 12/09/2021  Weight (lbs) 187 lb 3.2 oz 185 lb 186 lb  Weight (kg) 84.913 kg 83.915 kg 84.369 kg      Telemetry    SB in the 66s - Personally Reviewed  ECG    No  new - Personally Reviewed  Physical Exam   GEN: No acute distress.   Neck: No JVD Cardiac: RRR, no murmurs, rubs, or gallops.  Respiratory: Clear to auscultation bilaterally. GI: Soft, nontender, non-distended  MS: No edema; No deformity. Neuro:  Nonfocal  Psych: Normal affect   Labs    High Sensitivity Troponin:  No results for input(s): TROPONINIHS in the last 720 hours.   Chemistry Recent Labs  Lab 12/17/21 0934  NA 135  K 3.7  CL 103  CO2 25  GLUCOSE 148*  BUN 16  CREATININE 0.87  CALCIUM 9.4  GFRNONAA >60  ANIONGAP 7    Lipids No results for input(s): CHOL, TRIG, HDL, LABVLDL, LDLCALC, CHOLHDL in the last 168 hours.  Hematology Recent Labs  Lab 12/17/21 0717 12/18/21 0136  WBC 4.3 5.0  RBC 4.39 4.36  HGB 12.8* 12.8*  HCT 38.0* 37.7*  MCV 86.6 86.5  MCH 29.2 29.4  MCHC 33.7 34.0  RDW 12.6 12.6  PLT 129* 127*   Thyroid No results for input(s): TSH, FREET4 in the last  168 hours.  BNPNo results for input(s): BNP, PROBNP in the last 168 hours.  DDimer No results for input(s): DDIMER in the last 168 hours.   Radiology    CARDIAC CATHETERIZATION  Result Date: 12/16/2021   Mid LM to Ost LAD lesion is 40% stenosed with 40% stenosed side branch in Ost Cx.   Ost LAD to Prox LAD lesion is 95% stenosed.   Prox LAD to Mid LAD DES Stent is widely patent   Prox Cx- 1st Mrg DES STENT is widely patent   1st Diag lesion is 25% stenosed.   RPAV lesion is 50% stenosed.   -----------------------------------   The left ventricular systolic function is normal.  The left ventricular ejection fraction is 55-65% by visual estimate.   LV end diastolic pressure is normal.   There is no aortic valve stenosis. 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 ->  Location of the stenosis and involvement of left main makes PCI of the LAD suboptimal especially in the patient  who has pending surgery.  Would not be willing to stop antiplatelet agent after 3 to 6 months in the setting of Left Main Stenting. Only minimal RCA disease as well as downstream LCx and LAD disease. Preserved LVEF normal EDP. RECOMMENDATION Based on significance of ostial left main disease and level of his symptoms, I feel it is safer to admit him to the hospital for CVTS consultation.  We will start IV heparin. Titrate home dose of statin Restart BP meds Sliding scale insulin Glenetta Hew, MD  ECHOCARDIOGRAM COMPLETE  Result Date: 12/16/2021    ECHOCARDIOGRAM REPORT   Patient Name:   Ronald Petersen Date of Exam: 12/16/2021 Medical Rec #:  [id number]        Height:       71.0 in Accession #:    [id number]       Weight:       185.0 lb Date of Birth:  1956-01-01        BSA:          2.040 m Patient Age:    84 years         BP:           131/70 mmHg Patient Gender: M                HR:           60 bpm. Exam Location:  Inpatient Procedure: 2D Echo, Color Doppler and Cardiac Doppler Indications:    CAD  History:        Patient has prior history of Echocardiogram examinations. CAD;                 Risk Factors:Hypertension and Diabetes.  Sonographer:    Jyl Heinz Referring Phys: [id number] HARRELL O LIGHTFOOT IMPRESSIONS  1. Left ventricular ejection fraction, by estimation, is 60 to 65%. The left ventricle has normal function. The left ventricle has no regional wall motion abnormalities. There is moderate asymmetric left ventricular hypertrophy of the basal-septal segment. Left ventricular diastolic parameters are indeterminate.  2. Right ventricular systolic function is normal. The right ventricular size is normal. There is mildly elevated pulmonary artery systolic pressure. The estimated right ventricular systolic pressure is 35.0 mmHg.  3. Left atrial size was mildly dilated.  4. The mitral valve is normal in structure. Trivial mitral valve regurgitation. No evidence of mitral  stenosis.  5. The aortic valve  is tricuspid. Aortic valve regurgitation is trivial. Mild aortic valve stenosis. Vmax 2.2 m/s, MG 59mmHg, AVA 2.0 cm^2, DI 0.54  6. The inferior vena cava is dilated in size with <50% respiratory variability, suggesting right atrial pressure of 15 mmHg. FINDINGS  Left Ventricle: Left ventricular ejection fraction, by estimation, is 60 to 65%. The left ventricle has normal function. The left ventricle has no regional wall motion abnormalities. The left ventricular internal cavity size was normal in size. There is  moderate asymmetric left ventricular hypertrophy of the basal-septal segment. Left ventricular diastolic parameters are indeterminate. Right Ventricle: The right ventricular size is normal. No increase in right ventricular wall thickness. Right ventricular systolic function is normal. There is mildly elevated pulmonary artery systolic pressure. The tricuspid regurgitant velocity is 2.35  m/s, and with an assumed right atrial pressure of 15 mmHg, the estimated right ventricular systolic pressure is 67.5 mmHg. Left Atrium: Left atrial size was mildly dilated. Right Atrium: Right atrial size was normal in size. Pericardium: There is no evidence of pericardial effusion. Mitral Valve: The mitral valve is normal in structure. Trivial mitral valve regurgitation. No evidence of mitral valve stenosis. Tricuspid Valve: The tricuspid valve is normal in structure. Tricuspid valve regurgitation is trivial. Aortic Valve: The aortic valve is tricuspid. Aortic valve regurgitation is trivial. Mild aortic stenosis is present. Aortic valve mean gradient measures 10.0 mmHg. Aortic valve peak gradient measures 18.1 mmHg. Aortic valve area, by VTI measures 2.28 cm. Pulmonic Valve: The pulmonic valve was not well visualized. Pulmonic valve regurgitation is trivial. Aorta: The aortic root and ascending aorta are structurally normal, with no evidence of dilitation. Venous: The inferior vena cava is dilated in size with less than  50% respiratory variability, suggesting right atrial pressure of 15 mmHg. IAS/Shunts: The interatrial septum was not well visualized.  LEFT VENTRICLE PLAX 2D LVIDd:         5.40 cm      Diastology LVIDs:         3.10 cm      LV e' medial:    5.98 cm/s LV PW:         1.00 cm      LV E/e' medial:  13.9 LV IVS:        1.40 cm      LV e' lateral:   8.38 cm/s LVOT diam:     2.20 cm      LV E/e' lateral: 9.9 LV SV:         112 LV SV Index:   55 LVOT Area:     3.80 cm  LV Volumes (MOD) LV vol d, MOD A2C: 138.0 ml LV vol d, MOD A4C: 145.0 ml LV vol s, MOD A2C: 57.7 ml LV vol s, MOD A4C: 55.0 ml LV SV MOD A2C:     80.3 ml LV SV MOD A4C:     145.0 ml LV SV MOD BP:      93.8 ml RIGHT VENTRICLE             IVC RV Basal diam:  4.50 cm     IVC diam: 2.20 cm RV Mid diam:    2.90 cm RV S prime:     16.00 cm/s TAPSE (M-mode): 4.1 cm LEFT ATRIUM             Index        RIGHT ATRIUM           Index  LA diam:        3.90 cm 1.91 cm/m   RA Area:     17.70 cm LA Vol (A2C):   69.9 ml 34.27 ml/m  RA Volume:   46.50 ml  22.80 ml/m LA Vol (A4C):   69.8 ml 34.22 ml/m LA Biplane Vol: 70.9 ml 34.76 ml/m  AORTIC VALVE AV Area (Vmax):    2.21 cm AV Area (Vmean):   2.26 cm AV Area (VTI):     2.28 cm AV Vmax:           212.67 cm/s AV Vmean:          151.667 cm/s AV VTI:            0.492 m AV Peak Grad:      18.1 mmHg AV Mean Grad:      10.0 mmHg LVOT Vmax:         123.50 cm/s LVOT Vmean:        90.350 cm/s LVOT VTI:          0.294 m LVOT/AV VTI ratio: 0.60  AORTA Ao Root diam: 3.50 cm Ao Asc diam:  3.20 cm MITRAL VALVE               TRICUSPID VALVE MV Area (PHT): 4.71 cm    TR Peak grad:   22.1 mmHg MV Decel Time: 161 msec    TR Vmax:        235.00 cm/s MV E velocity: 83.20 cm/s MV A velocity: 77.10 cm/s  SHUNTS MV E/A ratio:  1.08        Systemic VTI:  0.29 m                            Systemic Diam: 2.20 cm Oswaldo Milian MD Electronically signed by Oswaldo Milian MD Signature Date/Time: 12/16/2021/2:26:11 PM    Final      Cardiac Studies   LHC 12/16/2021   Mid LM to Ost LAD lesion is 40% stenosed with 40% stenosed side branch in Ost Cx.   Ost LAD to Prox LAD lesion is 95% stenosed.   Prox LAD to Mid LAD DES Stent is widely patent   Prox Cx- 1st Mrg DES STENT is widely patent   1st Diag lesion is 25% stenosed.   RPAV lesion is 50% stenosed.   -----------------------------------   The left ventricular systolic function is normal.  The left ventricular ejection fraction is 55-65% by visual estimate.   LV end diastolic pressure is normal.   There is no aortic valve stenosis.   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 ->   Location of the stenosis and involvement of left main makes PCI of the LAD suboptimal especially in the patient who has pending surgery.  Would not be willing to stop antiplatelet agent after 3 to 6 months in the setting of Left Main Stenting. Only minimal RCA disease as well as downstream LCx and LAD disease. Preserved LVEF normal EDP.   RECOMMENDATION Based on significance of ostial left main disease and level of his symptoms, I feel it is safer to admit him to the hospital for CVTS consultation.  We will start IV heparin. Titrate home dose of statin Restart BP meds Sliding scale insulin  Glenetta Hew, MD     TTE 12/16/2021 IMPRESSIONS   1. Left  ventricular ejection fraction, by estimation, is 60 to 65%. The left ventricle has normal function. The left ventricle has no regional wall motion abnormalities. There is moderate asymmetric left ventricular hypertrophy of the basal-septal  segment. Left ventricular diastolic parameters are indeterminate.   2. Right ventricular systolic function is normal. The right ventricular size is normal. There is mildly elevated pulmonary artery systolic pressure. The estimated right ventricular systolic pressure is 10.1 mmHg.    3. Left atrial size was mildly dilated.   4. The mitral valve is normal in structure. Trivial mitral valve  regurgitation. No evidence of mitral stenosis.   5. The aortic valve is tricuspid. Aortic valve regurgitation is trivial.  Mild aortic valve stenosis. Vmax 2.2 m/s, MG 67mmHg, AVA 2.0 cm^2, DI 0.54   6. The inferior vena cava is dilated in size with <50% respiratory  variability, suggesting right atrial pressure of 15 mmHg.   FINDINGS   Left Ventricle: Left ventricular ejection fraction, by estimation, is 60  to 65%. The left ventricle has normal function. The left ventricle has no  regional wall motion abnormalities. The left ventricular internal cavity  size was normal in size. There is   moderate asymmetric left ventricular hypertrophy of the basal-septal  segment. Left ventricular diastolic parameters are indeterminate.   Right Ventricle: The right ventricular size is normal. No increase in  right ventricular wall thickness. Right ventricular systolic function is  normal. There is mildly elevated pulmonary artery systolic pressure. The  tricuspid regurgitant velocity is 2.35   m/s, and with an assumed right atrial pressure of 15 mmHg, the estimated  right ventricular systolic pressure is 75.1 mmHg.   Left Atrium: Left atrial size was mildly dilated.   Right Atrium: Right atrial size was normal in size.   Pericardium: There is no evidence of pericardial effusion.   Mitral Valve: The mitral valve is normal in structure. Trivial mitral  valve regurgitation. No evidence of mitral valve stenosis.   Tricuspid Valve: The tricuspid valve is normal in structure. Tricuspid  valve regurgitation is trivial.   Aortic Valve: The aortic valve is tricuspid. Aortic valve regurgitation is  trivial. Mild aortic stenosis is present. Aortic valve mean gradient  measures 10.0 mmHg. Aortic valve peak gradient measures 18.1 mmHg. Aortic  valve area, by VTI measures 2.28  cm.   Pulmonic  Valve: The pulmonic valve was not well visualized. Pulmonic valve  regurgitation is trivial.   Aorta: The aortic root and ascending aorta are structurally normal, with  no evidence of dilitation.   Venous: The inferior vena cava is dilated in size with less than 50%  respiratory variability, suggesting right atrial pressure of 15 mmHg.   IAS/Shunts: The interatrial septum was not well visualized.   Patient Profile     65 y.o. male with history of HTN, DM, HLD, obesity, OSA, CAD s/p stent presented for an elective LHC with severe LAD stenosis.   Assessment & Plan    CAD hx of PCI to LCX, now with ongoing angina and cath with severe ostial to prox LAD disease of 95% and LM of 40%. Plan for CABG --on IV heparin and NTG patch. Along with ranexa and amlodipine, has had trouble tolerating Imdur in the past. --TCTS consult note, Dr. Kipp Brood to decide timing of surgery may be next Tuesday  HTN stable  BP 025 systolic to 85/27  Now BP 87/51 so held lisinopril and lasix - continued amlodipine for angina but may hold this AM, continue ranexa.  HLD on statin  OSA with cpap at hs  DM-2 on SSI -- metformin on hold glucose 105 to 190 stable.  For questions or updates, please contact [place] Please consult [email or web address] for contact info under        Signed, Cecilie Kicks, NP  12/18/2021, 6:36 AM

## 2021-12-18 NOTE — Progress Notes (Signed)
BP 87/51 with HR 51 at 0814 hrs, NP [place] notified.  Orders placed to d/c BP medications. Repeat BP 84/40, HR 50, norvasc and Nitrodur patch held per NP [facility] for SBP <154mmhg. Patient denies dizziness.

## 2021-12-18 NOTE — Progress Notes (Signed)
ANTICOAGULATION CONSULT NOTE  Pharmacy Consult for Heparin Indication: CAD awaiting CABG   Allergies  Allergen Reactions   Niacin Anaphylaxis    Patient Measurements: Height: 5\' 11"  (180.3 cm) Weight: 84.9 kg (187 lb 3.2 oz) IBW/kg (Calculated) : 75.3 Heparin Dosing Weight: 84 kg  Vital Signs: Temp: 97.9 F (36.6 C) (12/21 0626) Temp Source: Oral (12/21 0626) BP: 90/53 (12/21 0626) Pulse Rate: 54 (12/21 0626)  Labs: Recent Labs    12/17/21 0717 12/17/21 0934 12/17/21 1558 12/18/21 0136  HGB 12.8*  --   --  12.8*  HCT 38.0*  --   --  37.7*  PLT 129*  --   --  127*  HEPARINUNFRC 0.37  --  0.43 0.36  CREATININE  --  0.87  --   --      Estimated Creatinine Clearance: 89 mL/min (by C-G formula based on SCr of 0.87 mg/dL).   Assessment:  65 y.o. male with severe CAD s/p cath, awaiting CABG. Heparin level therapeutic, CBC stable.  Goal of Therapy:  Heparin level 0.3-0.7 units/ml Monitor platelets by anticoagulation protocol: Yes   Plan:  Continue IV heparin at 1400 units/hr Daily heparin level and CBC  Arrie Senate, PharmD, [place], [facility] Clinical Pharmacist [phone] Please check AMION for all [place] numbers 12/18/2021

## 2021-12-18 NOTE — Progress Notes (Signed)
CARDIAC REHAB PHASE I   PRE:  Rate/Rhythm: 51 SB    BP: sitting 101/57    SaO2:   MODE:  Ambulation: 470 ft   POST:  Rate/Rhythm: 59 SB    BP: sitting 123/66     SaO2: 97 RA  Pt still endorses light chest pressure in bed at times. Sts it goes away when he gets up in room. No chest tightness walking, felt good to move. Pt did st slight SOB once sitting in recliner. Reviewed ed. Pt practiced IS, 1250-1500 ml [id number]  [place], ACSM 12/18/2021 11:02 AM

## 2021-12-19 ENCOUNTER — Other Ambulatory Visit (HOSPITAL_COMMUNITY): Payer: Medicare HMO

## 2021-12-19 ENCOUNTER — Inpatient Hospital Stay (HOSPITAL_COMMUNITY): Admit: 2021-12-19 | Payer: Medicare HMO

## 2021-12-19 DIAGNOSIS — I25110 Atherosclerotic heart disease of native coronary artery with unstable angina pectoris: Secondary | ICD-10-CM | POA: Diagnosis not present

## 2021-12-19 LAB — HEPARIN LEVEL (UNFRACTIONATED): Heparin Unfractionated: 0.52 IU/mL (ref 0.30–0.70)

## 2021-12-19 LAB — CBC
HCT: 38 % — ABNORMAL LOW (ref 39.0–52.0)
Hemoglobin: 12.6 g/dL — ABNORMAL LOW (ref 13.0–17.0)
MCH: 28.6 pg (ref 26.0–34.0)
MCHC: 33.2 g/dL (ref 30.0–36.0)
MCV: 86.4 fL (ref 80.0–100.0)
Platelets: 128 10*3/uL — ABNORMAL LOW (ref 150–400)
RBC: 4.4 MIL/uL (ref 4.22–5.81)
RDW: 12.6 % (ref 11.5–15.5)
WBC: 4.8 10*3/uL (ref 4.0–10.5)
nRBC: 0 % (ref 0.0–0.2)

## 2021-12-19 LAB — GLUCOSE, CAPILLARY
Glucose-Capillary: 105 mg/dL — ABNORMAL HIGH (ref 70–99)
Glucose-Capillary: 142 mg/dL — ABNORMAL HIGH (ref 70–99)
Glucose-Capillary: 110 mg/dL — ABNORMAL HIGH (ref 70–99)
Glucose-Capillary: 162 mg/dL — ABNORMAL HIGH (ref 70–99)

## 2021-12-19 LAB — PLATELET INHIBITION P2Y12

## 2021-12-19 MED ORDER — MAGNESIUM HYDROXIDE 400 MG/5ML PO SUSP
30.0000 mL | Freq: Once | ORAL | Status: AC
  Administered 2021-12-19: 17:00:00 30 mL via ORAL
  Filled 2021-12-19: qty 30

## 2021-12-19 NOTE — Progress Notes (Signed)
Have checked with pt x3 today. Ambulating independently and practicing IS. Sts no CP, just SOB after walking.  [place], ACSM 1:05 PM 12/19/2021

## 2021-12-19 NOTE — Progress Notes (Addendum)
Progress Note  Patient Name: Ronald Petersen Date of Encounter: 12/19/2021  [facility] Cardiologist: Berniece Salines, DO   Subjective   No chest pain and no SOB, walked in hall with cardiac rehab  Inpatient Medications    Scheduled Meds:  amLODipine  2.5 mg Oral Daily   aspirin EC  81 mg Oral Daily   fenofibrate  160 mg Oral Daily   gabapentin  800 mg Oral TID   insulin aspart  0-15 Units Subcutaneous TID WC   magnesium oxide  400 mg Oral Daily   nitroGLYCERIN  0.1 mg Transdermal Daily   pantoprazole  40 mg Oral BID AC   polyethylene glycol  17 g Oral Daily   ranolazine  1,000 mg Oral BID   rosuvastatin  40 mg Oral Daily   sertraline  150 mg Oral Daily   sodium chloride flush  3 mL Intravenous Q12H   sodium chloride flush  3 mL Intravenous Q12H   traZODone  50 mg Oral QHS   vitamin B-12  2,500 mcg Oral Daily   Vitamin E  1,600 Units Oral Daily   Continuous Infusions:  sodium chloride     heparin 1,400 Units/hr (12/19/21 0247)   PRN Meds: sodium chloride, acetaminophen, dicyclomine, morphine injection, nitroGLYCERIN, ondansetron (ZOFRAN) IV, oxyCODONE, sodium chloride flush, tiZANidine   Vital Signs    Vitals:   12/18/21 1350 12/18/21 1539 12/18/21 2041 12/19/21 0524  BP: (!) 106/55 125/70 125/69 (!) 102/47  Pulse: 60 (!) 56 60 (!) 53  Resp:  18 18 17   Temp: 97.7 F (36.5 C) 97.8 F (36.6 C) 97.8 F (36.6 C) 97.8 F (36.6 C)  TempSrc:  Oral Oral Oral  SpO2: 99% 99% 96% 96%  Weight:      Height:        Intake/Output Summary (Last 24 hours) at 12/19/2021 0754 Last data filed at 12/18/2021 1839 Gross per 24 hour  Intake 1416.61 ml  Output --  Net 1416.61 ml   Last 3 Weights 12/16/2021 12/16/2021 12/09/2021  Weight (lbs) 187 lb 3.2 oz 185 lb 186 lb  Weight (kg) 84.913 kg 83.915 kg 84.369 kg      Telemetry    SB - Personally Reviewed  ECG    No new - Personally Reviewed  Physical Exam   GEN: No acute distress.   Neck: No JVD Cardiac:  RRR, no murmurs, rubs, or gallops.  Respiratory: Clear to auscultation bilaterally. GI: Soft, nontender, non-distended  MS: No edema; No deformity. Neuro:  Nonfocal  Psych: Normal affect   Labs    High Sensitivity Troponin:  No results for input(s): TROPONINIHS in the last 720 hours.   Chemistry Recent Labs  Lab 12/17/21 0934  NA 135  K 3.7  CL 103  CO2 25  GLUCOSE 148*  BUN 16  CREATININE 0.87  CALCIUM 9.4  GFRNONAA >60  ANIONGAP 7    Lipids No results for input(s): CHOL, TRIG, HDL, LABVLDL, LDLCALC, CHOLHDL in the last 168 hours.  Hematology Recent Labs  Lab 12/17/21 0717 12/18/21 0136 12/19/21 0523  WBC 4.3 5.0 4.8  RBC 4.39 4.36 4.40  HGB 12.8* 12.8* 12.6*  HCT 38.0* 37.7* 38.0*  MCV 86.6 86.5 86.4  MCH 29.2 29.4 28.6  MCHC 33.7 34.0 33.2  RDW 12.6 12.6 12.6  PLT 129* 127* 128*   Thyroid No results for input(s): TSH, FREET4 in the last 168 hours.  BNPNo results for input(s): BNP, PROBNP in the last 168 hours.  DDimer  No results for input(s): DDIMER in the last 168 hours.   Radiology    No results found.  Cardiac Studies   LHC 12/16/2021   Mid LM to Ost LAD lesion is 40% stenosed with 40% stenosed side branch in Ost Cx.   Ost LAD to Prox LAD lesion is 95% stenosed.   Prox LAD to Mid LAD DES Stent is widely patent   Prox Cx- 1st Mrg DES STENT is widely patent   1st Diag lesion is 25% stenosed.   RPAV lesion is 50% stenosed.   -----------------------------------   The left ventricular systolic function is normal.  The left ventricular ejection fraction is 55-65% by visual estimate.   LV end diastolic pressure is normal.   There is no aortic valve stenosis.   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 ->   Location of the stenosis and involvement of left main makes PCI of the LAD suboptimal especially in the patient who has pending  surgery.  Would not be willing to stop antiplatelet agent after 3 to 6 months in the setting of Left Main Stenting. Only minimal RCA disease as well as downstream LCx and LAD disease. Preserved LVEF normal EDP.   RECOMMENDATION Based on significance of ostial left main disease and level of his symptoms, I feel it is safer to admit him to the hospital for CVTS consultation.  We will start IV heparin. Titrate home dose of statin Restart BP meds Sliding scale insulin  Glenetta Hew, MD     TTE 12/16/2021 IMPRESSIONS   1. Left ventricular ejection fraction, by estimation, is 60 to 65%. The left ventricle has normal function. The left ventricle has no regional wall motion abnormalities. There is moderate asymmetric left ventricular hypertrophy of the basal-septal  segment. Left ventricular diastolic parameters are indeterminate.   2. Right ventricular systolic function is normal. The right ventricular size is normal. There is mildly elevated pulmonary artery systolic pressure. The estimated right ventricular systolic pressure is 78.2 mmHg.   3. Left atrial size was mildly dilated.   4. The mitral valve is normal in structure. Trivial mitral valve  regurgitation. No evidence of mitral stenosis.   5. The aortic valve is tricuspid. Aortic valve regurgitation is trivial.  Mild aortic valve stenosis. Vmax 2.2 m/s, MG 67mmHg, AVA 2.0 cm^2, DI 0.54   6. The inferior vena cava is dilated in size with <50% respiratory  variability, suggesting right atrial pressure of 15 mmHg.   FINDINGS   Left Ventricle: Left ventricular ejection fraction, by estimation, is 60  to 65%. The left ventricle has normal function. The left ventricle has no  regional wall motion abnormalities. The left ventricular internal cavity  size was normal in size. There is   moderate asymmetric left ventricular hypertrophy of the basal-septal  segment. Left ventricular diastolic parameters are indeterminate.   Right Ventricle: The  right ventricular size is normal. No increase in  right ventricular wall thickness. Right ventricular systolic function is  normal. There is mildly elevated pulmonary artery systolic pressure. The  tricuspid regurgitant velocity is 2.35   m/s, and with an assumed right atrial pressure of 15 mmHg, the estimated  right ventricular systolic pressure is 95.6 mmHg.   Left Atrium: Left atrial size was mildly dilated.   Right Atrium: Right atrial size was normal in size.   Pericardium: There is no evidence of pericardial  effusion.   Mitral Valve: The mitral valve is normal in structure. Trivial mitral  valve regurgitation. No evidence of mitral valve stenosis.   Tricuspid Valve: The tricuspid valve is normal in structure. Tricuspid  valve regurgitation is trivial.   Aortic Valve: The aortic valve is tricuspid. Aortic valve regurgitation is  trivial. Mild aortic stenosis is present. Aortic valve mean gradient  measures 10.0 mmHg. Aortic valve peak gradient measures 18.1 mmHg. Aortic  valve area, by VTI measures 2.28  cm.   Pulmonic Valve: The pulmonic valve was not well visualized. Pulmonic valve  regurgitation is trivial.   Aorta: The aortic root and ascending aorta are structurally normal, with  no evidence of dilitation.   Venous: The inferior vena cava is dilated in size with less than 50%  respiratory variability, suggesting right atrial pressure of 15 mmHg.   IAS/Shunts: The interatrial septum was not well visualized.   Patient Profile     65 y.o. male with history of HTN, DM, HLD, obesity, OSA, CAD s/p stent presented for an elective LHC with severe LAD stenosis.  Assessment & Plan    CAD hx of PCI to LCX, now with ongoing angina and cath with severe ostial to prox LAD disease of 95% and LM of 40%. Plan for CABG --on IV heparin and NTG patch.though with low BP it has been held Along with ranexa and amlodipine, has had trouble tolerating Imdur in the past. --TCTS consult  note, Dr. Kipp Brood to decide timing of surgery may be next Tuesday  -pre-CABG dopplers pending   HTN  -Yesterday BP low in 00T systolic so held lisinopril and lasix - continued amlodipine but held yesterday, continued ranexa. -today soft 102/47 no dizziness   HLD on statin   OSA with cpap at hs   DM-2 on SSI -- metformin on hold glucose 105 to 180 stable.    For questions or updates, please contact [facility] Please consult [email or web address] for contact info under        Signed, Cecilie Kicks, NP  12/19/2021, 7:54 AM    History and all data above reviewed.  Patient examined.  I agree with the findings as above.  No pain.  No SOB. The patient exam reveals COR:RRR  ,  Lungs: Clear  ,  Abd: Positive bowel sounds, no rebound no guarding, Ext No edema  .  All available labs, radiology testing, previous records reviewed. Agree with documented assessment and plan. Hypotension:  I will stop the amlodipine as I would like to avoid hypotension with potential for low perfusion pressures.   Ronald Petersen  2:35 PM  12/19/2021

## 2021-12-19 NOTE — Progress Notes (Signed)
ANTICOAGULATION CONSULT NOTE  Pharmacy Consult for Heparin Indication: CAD awaiting CABG   Allergies  Allergen Reactions   Niacin Anaphylaxis    Patient Measurements: Height: 5\' 11"  (180.3 cm) Weight: 84.9 kg (187 lb 3.2 oz) IBW/kg (Calculated) : 75.3 Heparin Dosing Weight: 84 kg  Vital Signs: Temp: 97.8 F (36.6 C) (12/22 0524) Temp Source: Oral (12/22 0524) BP: 102/47 (12/22 0524) Pulse Rate: 53 (12/22 0524)  Labs: Recent Labs    12/17/21 0717 12/17/21 0934 12/17/21 1558 12/18/21 0136 12/19/21 0523  HGB 12.8*  --   --  12.8* 12.6*  HCT 38.0*  --   --  37.7* 38.0*  PLT 129*  --   --  127* 128*  HEPARINUNFRC 0.37  --  0.43 0.36 0.52  CREATININE  --  0.87  --   --   --      Estimated Creatinine Clearance: 89 mL/min (by C-G formula based on SCr of 0.87 mg/dL).   Assessment:  65 y.o. male with severe CAD s/p cath, awaiting CABG. Heparin level therapeutic, CBC stable.  Goal of Therapy:  Heparin level 0.3-0.7 units/ml Monitor platelets by anticoagulation protocol: Yes   Plan:  Continue IV heparin at 1400 units/hr Daily heparin level and CBC  Arrie Senate, PharmD, [place], [facility] Clinical Pharmacist [phone] Please check AMION for all [facility] numbers 12/19/2021

## 2021-12-20 ENCOUNTER — Inpatient Hospital Stay (HOSPITAL_COMMUNITY): Payer: Medicare HMO

## 2021-12-20 DIAGNOSIS — Z01810 Encounter for preprocedural cardiovascular examination: Secondary | ICD-10-CM | POA: Diagnosis not present

## 2021-12-20 DIAGNOSIS — I25110 Atherosclerotic heart disease of native coronary artery with unstable angina pectoris: Secondary | ICD-10-CM | POA: Diagnosis not present

## 2021-12-20 LAB — BASIC METABOLIC PANEL
Anion gap: 6 (ref 5–15)
BUN: 21 mg/dL (ref 8–23)
CO2: 28 mmol/L (ref 22–32)
Calcium: 9.6 mg/dL (ref 8.9–10.3)
Chloride: 102 mmol/L (ref 98–111)
Creatinine, Ser: 0.95 mg/dL (ref 0.61–1.24)
GFR, Estimated: >60 mL/min (ref >60)
Glucose, Bld: 122 mg/dL — ABNORMAL HIGH (ref 70–99)
Potassium: 4.2 mmol/L (ref 3.5–5.1)
Sodium: 136 mmol/L (ref 135–145)

## 2021-12-20 LAB — CBC
HCT: 37.1 % — ABNORMAL LOW (ref 39.0–52.0)
Hemoglobin: 12.7 g/dL — ABNORMAL LOW (ref 13.0–17.0)
MCH: 29.5 pg (ref 26.0–34.0)
MCHC: 34.2 g/dL (ref 30.0–36.0)
MCV: 86.1 fL (ref 80.0–100.0)
Platelets: 131 10*3/uL — ABNORMAL LOW (ref 150–400)
RBC: 4.31 MIL/uL (ref 4.22–5.81)
RDW: 12.4 % (ref 11.5–15.5)
WBC: 5.1 10*3/uL (ref 4.0–10.5)
nRBC: 0 % (ref 0.0–0.2)

## 2021-12-20 LAB — GLUCOSE, CAPILLARY
Glucose-Capillary: 124 mg/dL — ABNORMAL HIGH (ref 70–99)
Glucose-Capillary: 175 mg/dL — ABNORMAL HIGH (ref 70–99)
Glucose-Capillary: 171 mg/dL — ABNORMAL HIGH (ref 70–99)
Glucose-Capillary: 121 mg/dL — ABNORMAL HIGH (ref 70–99)

## 2021-12-20 LAB — HEPARIN LEVEL (UNFRACTIONATED): Heparin Unfractionated: 0.4 IU/mL (ref 0.30–0.70)

## 2021-12-20 NOTE — Progress Notes (Signed)
ANTICOAGULATION CONSULT NOTE  Pharmacy Consult for Heparin Indication: CAD awaiting CABG   Allergies  Allergen Reactions   Niacin Anaphylaxis    Patient Measurements: Height: 5\' 11"  (180.3 cm) Weight: 84.9 kg (187 lb 3.2 oz) IBW/kg (Calculated) : 75.3 Heparin Dosing Weight: 84 kg  Vital Signs: Temp: 98 F (36.7 C) (12/23 0628) Temp Source: Oral (12/23 0628) BP: 103/54 (12/23 0628) Pulse Rate: 51 (12/23 0628)  Labs: Recent Labs    12/17/21 0934 12/17/21 1558 12/18/21 0136 12/19/21 0523 12/20/21 0158  HGB  --   --  12.8* 12.6* 12.7*  HCT  --   --  37.7* 38.0* 37.1*  PLT  --   --  127* 128* 131*  HEPARINUNFRC  --    < > 0.36 0.52 0.40  CREATININE 0.87  --   --   --  0.95   < > = values in this interval not displayed.     Estimated Creatinine Clearance: 81.5 mL/min (by C-G formula based on SCr of 0.95 mg/dL).   Assessment:  66 y.o. male with severe CAD s/p cath, awaiting CABG. Heparin level remains therapeutic, CBC stable.  Goal of Therapy:  Heparin level 0.3-0.7 units/ml Monitor platelets by anticoagulation protocol: Yes   Plan:  Continue IV heparin at 1400 units/hr Daily heparin level and CBC  Arrie Senate, PharmD, [place], [facility] Clinical Pharmacist [phone] Please check AMION for all [facility] Pharmacy numbers 12/20/2021

## 2021-12-20 NOTE — Progress Notes (Signed)
Pre-CABG study completed.  ° °Please see CV Proc for preliminary results.  ° °Seleena Reimers, RDMS, RVT ° °

## 2021-12-20 NOTE — Care Management Important Message (Signed)
Important Message  Patient Details  Name: Ronald Petersen MRN: [id number] Date of Birth: September 13, 1956   Medicare Important Message Given:  Yes     [name] 12/20/2021, 8:53 AM

## 2021-12-20 NOTE — Progress Notes (Signed)
Mobility Specialist Progress Note    12/20/21 1639  Mobility  Activity Ambulated in hall  Level of Assistance Modified independent, requires aide device or extra time  Assistive Device  (IV pole)  Distance Ambulated (ft) 520 ft  Mobility Ambulated independently in hallway  Mobility Response Tolerated well  Mobility performed by Mobility specialist  Bed Position Chair  $Mobility charge 1 Mobility   Pt received in chair and agreeable. No complaints on walk. Returned to chair with call bell in reach.   [facility] Mobility Specialist  M.S. Primary Phone: [phone] M.S. Secondary Phone: [phone]

## 2021-12-20 NOTE — Progress Notes (Signed)
Progress Note  Patient Name: Ronald Petersen Date of Encounter: 12/20/2021  Primary Cardiologist:   Berniece Salines, DO   Subjective   No chest pain.  No SOB.   Inpatient Medications    Scheduled Meds:  aspirin EC  81 mg Oral Daily   fenofibrate  160 mg Oral Daily   gabapentin  800 mg Oral TID   insulin aspart  0-15 Units Subcutaneous TID WC   magnesium oxide  400 mg Oral Daily   nitroGLYCERIN  0.1 mg Transdermal Daily   pantoprazole  40 mg Oral BID AC   polyethylene glycol  17 g Oral Daily   ranolazine  1,000 mg Oral BID   rosuvastatin  40 mg Oral Daily   sertraline  150 mg Oral Daily   sodium chloride flush  3 mL Intravenous Q12H   sodium chloride flush  3 mL Intravenous Q12H   traZODone  50 mg Oral QHS   vitamin B-12  2,500 mcg Oral Daily   Vitamin E  1,600 Units Oral Daily   Continuous Infusions:  sodium chloride     heparin 1,400 Units/hr (12/19/21 1907)   PRN Meds: sodium chloride, acetaminophen, dicyclomine, morphine injection, nitroGLYCERIN, ondansetron (ZOFRAN) IV, oxyCODONE, sodium chloride flush, tiZANidine   Vital Signs    Vitals:   12/19/21 0524 12/19/21 0857 12/19/21 2108 12/20/21 0628  BP: (!) 102/47 (!) 98/59 109/69 (!) 103/54  Pulse: (!) 53  61 (!) 51  Resp: 17 16 18 16   Temp: 97.8 F (36.6 C) 98 F (36.7 C) 98 F (36.7 C) 98 F (36.7 C)  TempSrc: Oral Oral Oral Oral  SpO2: 96% 100% 96% 96%  Weight:      Height:        Intake/Output Summary (Last 24 hours) at 12/20/2021 1247 Last data filed at 12/20/2021 1045 Gross per 24 hour  Intake 480 ml  Output 1050 ml  Net -570 ml   Filed Weights   12/16/21 0557 12/16/21 1523  Weight: 83.9 kg 84.9 kg    Telemetry    NSR - Personally Reviewed  ECG    NA - Personally Reviewed  Physical Exam   GEN: No acute distress.   Neck: No  JVD Cardiac: RRR, no murmurs, rubs, or gallops.  Respiratory: Clear  to auscultation bilaterally. GI: Soft, nontender, non-distended  MS: No  edema; No  deformity. Neuro:  Nonfocal  Psych: Normal affect   Labs    Chemistry Recent Labs  Lab 12/17/21 0934 12/20/21 0158  NA 135 136  K 3.7 4.2  CL 103 102  CO2 25 28  GLUCOSE 148* 122*  BUN 16 21  CREATININE 0.87 0.95  CALCIUM 9.4 9.6  GFRNONAA >60 >60  ANIONGAP 7 6     Hematology Recent Labs  Lab 12/18/21 0136 12/19/21 0523 12/20/21 0158  WBC 5.0 4.8 5.1  RBC 4.36 4.40 4.31  HGB 12.8* 12.6* 12.7*  HCT 37.7* 38.0* 37.1*  MCV 86.5 86.4 86.1  MCH 29.4 28.6 29.5  MCHC 34.0 33.2 34.2  RDW 12.6 12.6 12.4  PLT 127* 128* 131*    Cardiac EnzymesNo results for input(s): TROPONINI in the last 168 hours. No results for input(s): TROPIPOC in the last 168 hours.   BNPNo results for input(s): BNP, PROBNP in the last 168 hours.   DDimer No results for input(s): DDIMER in the last 168 hours.   Radiology    VAS US DOPPLER PRE CABG  Result Date: 12/20/2021 PREOPERATIVE VASCULAR EVALUATION Patient Name:  Ronald Petersen  Date of Exam:   12/20/2021 Medical Rec #: [id number]         Accession #:    [id number] Date of Birth: Aug 27, 1956         Patient Gender: M Patient Age:   65 years Exam Location:  [facility] Procedure:      VAS US DOPPLER PRE CABG Referring Phys: HARRELL LIGHTFOOT --------------------------------------------------------------------------------  Indications:      Pre-CABG. Risk Factors:     Hypertension, hyperlipidemia, Diabetes, past history of                   smoking, coronary artery disease. Comparison Study: No prior studies. Performing Technologist: Darlin Coco RDMS RVT  Examination Guidelines: A complete evaluation includes B-mode imaging, spectral Doppler, color Doppler, and power Doppler as needed of all accessible portions of each vessel. Bilateral testing is considered an integral part of a complete examination. Limited examinations for reoccurring indications may be performed as noted.  Right Carotid Findings:  +----------+--------+--------+--------+--------+------------------+             PSV cm/s EDV cm/s Stenosis Describe Comments            +----------+--------+--------+--------+--------+------------------+  CCA Prox   94       13                                             +----------+--------+--------+--------+--------+------------------+  CCA Distal 62       12                         intimal thickening  +----------+--------+--------+--------+--------+------------------+  ICA Prox   71       30                                             +----------+--------+--------+--------+--------+------------------+  ICA Distal 109      39                                             +----------+--------+--------+--------+--------+------------------+  ECA        73       9                                              +----------+--------+--------+--------+--------+------------------+ +----------+--------+-------+----------------+------------+             PSV cm/s EDV cms Describe         Arm Pressure  +----------+--------+-------+----------------+------------+  Subclavian 92               Multiphasic, WNL               +----------+--------+-------+----------------+------------+ +---------+--------+--+--------+--+---------+  Vertebral PSV cm/s 81 EDV cm/s 22 Antegrade  +---------+--------+--+--------+--+---------+ Left Carotid Findings: +----------+--------+--------+--------+---------------------+------------------+             PSV cm/s EDV cm/s Stenosis Describe              Comments            +----------+--------+--------+--------+---------------------+------------------+  CCA Prox   87       22                                                          +----------+--------+--------+--------+---------------------+------------------+  CCA Distal 72       16                                      intimal thickening  +----------+--------+--------+--------+---------------------+------------------+  ICA Prox   95       37       1-39%     hyperechoic and                                                                  smooth                                    +----------+--------+--------+--------+---------------------+------------------+  ICA Distal 103      42                                                          +----------+--------+--------+--------+---------------------+------------------+  ECA        100      14                                                          +----------+--------+--------+--------+---------------------+------------------+ +----------+--------+--------+----------------+------------+  Subclavian PSV cm/s EDV cm/s Describe         Arm Pressure  +----------+--------+--------+----------------+------------+             82                Multiphasic, WNL               +----------+--------+--------+----------------+------------+ +---------+--------+--+--------+--+---------+  Vertebral PSV cm/s 44 EDV cm/s 14 Antegrade  +---------+--------+--+--------+--+---------+  ABI Findings: +---------+------------------+-----+---------+--------+  Right     Rt Pressure (mmHg) Index Waveform  Comment   +---------+------------------+-----+---------+--------+  Brachial  128                      triphasic           +---------+------------------+-----+---------+--------+  PTA       140                1.09  triphasic           +---------+------------------+-----+---------+--------+  DP        132                1.03  triphasic           +---------+------------------+-----+---------+--------+  Great Toe 95                 0.74  Normal              +---------+------------------+-----+---------+--------+ +---------+------------------+-----+---------+-------+  Left      Lt Pressure (mmHg) Index Waveform  Comment  +---------+------------------+-----+---------+-------+  Brachial  126                      triphasic          +---------+------------------+-----+---------+-------+  PTA       134                1.05  triphasic           +---------+------------------+-----+---------+-------+  DP        116                0.91  triphasic          +---------+------------------+-----+---------+-------+  Great Toe 109                0.85  Normal             +---------+------------------+-----+---------+-------+  Right Doppler Findings: +--------+--------+-----+---------+--------+  Site     Pressure Index Doppler   Comments  +--------+--------+-----+---------+--------+  Brachial 128            triphasic           +--------+--------+-----+---------+--------+  Radial                  triphasic           +--------+--------+-----+---------+--------+  Ulnar                   triphasic           +--------+--------+-----+---------+--------+  Left Doppler Findings: +--------+--------+-----+---------+--------+  Site     Pressure Index Doppler   Comments  +--------+--------+-----+---------+--------+  Brachial 126            triphasic           +--------+--------+-----+---------+--------+  Radial                  triphasic           +--------+--------+-----+---------+--------+  Ulnar                   triphasic           +--------+--------+-----+---------+--------+  Summary: Right Carotid: The extracranial vessels were near-normal with only minimal wall                thickening or plaque. Left Carotid: Velocities in the left ICA are consistent with a 1-39% stenosis. Vertebrals:  Bilateral vertebral arteries demonstrate antegrade flow. Subclavians: Normal flow hemodynamics were seen in bilateral subclavian              arteries. Right ABI: Resting right ankle-brachial index is within normal range. No evidence of significant right lower extremity arterial disease. The right toe-brachial index is normal. Left ABI: Resting left ankle-brachial index is within normal range. No evidence of significant left lower extremity arterial disease. The left toe-brachial index is normal. Right Upper Extremity: Doppler waveforms remain within normal limits with right radial  compression. Doppler waveforms remain within normal limits with right ulnar compression. Left Upper Extremity: Doppler waveforms remain within normal limits with left radial compression. Doppler waveforms remain within normal limits with left ulnar compression.    Preliminary     Cardiac Studies  LHC 12/16/2021   Mid LM to Ost LAD lesion is 40% stenosed with 40% stenosed side branch in Ost Cx.   Ost LAD to Prox LAD lesion is 95% stenosed.   Prox LAD to Mid LAD DES Stent is widely patent   Prox Cx- 1st Mrg DES STENT is widely patent   1st Diag lesion is 25% stenosed.   RPAV lesion is 50% stenosed.   -----------------------------------   The left ventricular systolic function is normal.  The left ventricular ejection fraction is 55-65% by visual estimate.   LV end diastolic pressure is normal.   There is no aortic valve stenosis.    TTE 12/16/2021 IMPRESSIONS   1. Left ventricular ejection fraction, by estimation, is 60 to 65%. The left ventricle has normal function. The left ventricle has no regional wall motion abnormalities. There is moderate asymmetric left ventricular hypertrophy of the basal-septal  segment. Left ventricular diastolic parameters are indeterminate.   2. Right ventricular systolic function is normal. The right ventricular size is normal. There is mildly elevated pulmonary artery systolic pressure. The estimated right ventricular systolic pressure is 76.8 mmHg.   3. Left atrial size was mildly dilated.   4. The mitral valve is normal in structure. Trivial mitral valve  regurgitation. No evidence of mitral stenosis.   5. The aortic valve is tricuspid. Aortic valve regurgitation is trivial.  Mild aortic valve stenosis. Vmax 2.2 m/s, MG 65mmHg, AVA 2.0 cm^2, DI 0.54   6. The inferior vena cava is dilated in size with <50% respiratory  variability, suggesting right atrial pressure of 15 mmHg.     Patient Profile     65 y.o. male with history of HTN, DM, HLD, obesity,  OSA, CAD s/p stent presented for an elective LHC with severe LAD stenosis.  Assessment & Plan    CAD:   For CABG Tuesday likely.  Continue heparin.     HTN :   BP is controlled.  I stopped Norvasc.     HLD:  Continue current statin.     OSA:  Uses CPAP at home.    DM-2 on SSI   For questions or updates, please contact Industry Please consult [email or web address] for contact info under Cardiology/STEMI.   Signed, Minus Breeding, MD  12/20/2021, 12:47 PM

## 2021-12-21 DIAGNOSIS — I25110 Atherosclerotic heart disease of native coronary artery with unstable angina pectoris: Secondary | ICD-10-CM | POA: Diagnosis not present

## 2021-12-21 LAB — HEPARIN LEVEL (UNFRACTIONATED): Heparin Unfractionated: 0.35 IU/mL (ref 0.30–0.70)

## 2021-12-21 LAB — GLUCOSE, CAPILLARY
Glucose-Capillary: 153 mg/dL — ABNORMAL HIGH (ref 70–99)
Glucose-Capillary: 124 mg/dL — ABNORMAL HIGH (ref 70–99)
Glucose-Capillary: 148 mg/dL — ABNORMAL HIGH (ref 70–99)
Glucose-Capillary: 182 mg/dL — ABNORMAL HIGH (ref 70–99)

## 2021-12-21 LAB — CBC
HCT: 36.8 % — ABNORMAL LOW (ref 39.0–52.0)
Hemoglobin: 12.6 g/dL — ABNORMAL LOW (ref 13.0–17.0)
MCH: 29.5 pg (ref 26.0–34.0)
MCHC: 34.2 g/dL (ref 30.0–36.0)
MCV: 86.2 fL (ref 80.0–100.0)
Platelets: 126 10*3/uL — ABNORMAL LOW (ref 150–400)
RBC: 4.27 MIL/uL (ref 4.22–5.81)
RDW: 12.6 % (ref 11.5–15.5)
WBC: 4.7 10*3/uL (ref 4.0–10.5)
nRBC: 0 % (ref 0.0–0.2)

## 2021-12-21 NOTE — Progress Notes (Signed)
Progress Note  Patient Name: Ronald Petersen Date of Encounter: 12/21/2021  Primary Cardiologist:   Berniece Salines, DO   Subjective   Currently feeling well.  No chest pain or shortness of breath.  Inpatient Medications    Scheduled Meds:  aspirin EC  81 mg Oral Daily   fenofibrate  160 mg Oral Daily   gabapentin  800 mg Oral TID   insulin aspart  0-15 Units Subcutaneous TID WC   magnesium oxide  400 mg Oral Daily   nitroGLYCERIN  0.1 mg Transdermal Daily   pantoprazole  40 mg Oral BID AC   polyethylene glycol  17 g Oral Daily   ranolazine  1,000 mg Oral BID   rosuvastatin  40 mg Oral Daily   sertraline  150 mg Oral Daily   sodium chloride flush  3 mL Intravenous Q12H   sodium chloride flush  3 mL Intravenous Q12H   traZODone  50 mg Oral QHS   vitamin B-12  2,500 mcg Oral Daily   Vitamin E  1,600 Units Oral Daily   Continuous Infusions:  sodium chloride     heparin 1,400 Units/hr (12/21/21 0944)   PRN Meds: sodium chloride, acetaminophen, dicyclomine, morphine injection, nitroGLYCERIN, ondansetron (ZOFRAN) IV, oxyCODONE, sodium chloride flush, tiZANidine   Vital Signs    Vitals:   12/19/21 2108 12/20/21 0628 12/20/21 2223 12/21/21 0656  BP: 109/69 (!) 103/54 122/75 (!) 103/55  Pulse: 61 (!) 51 (!) 56 (!) 55  Resp: 18 16 18 17   Temp: 98 F (36.7 C) 98 F (36.7 C) (!) 97.4 F (36.3 C) 98.5 F (36.9 C)  TempSrc: Oral Oral Oral Oral  SpO2: 96% 96% 99% 98%  Weight:      Height:        Intake/Output Summary (Last 24 hours) at 12/21/2021 1048 Last data filed at 12/21/2021 0800 Gross per 24 hour  Intake 600 ml  Output --  Net 600 ml    Filed Weights   12/16/21 0557 12/16/21 1523  Weight: 83.9 kg 84.9 kg    Telemetry    Sinus rhythm-personally reviewed  ECG    None new  Physical Exam   GEN: Well nourished, well developed, in no acute distress  HEENT: normal  Neck: no JVD, carotid bruits, or masses Cardiac: RRR; no murmurs, rubs, or  gallops,no edema  Respiratory:  clear to auscultation bilaterally, normal work of breathing GI: soft, nontender, nondistended, + BS MS: no deformity or atrophy  Skin: warm and dry Neuro:  Strength and sensation are intact Psych: euthymic mood, full affect   Labs    Chemistry Recent Labs  Lab 12/17/21 0934 12/20/21 0158  NA 135 136  K 3.7 4.2  CL 103 102  CO2 25 28  GLUCOSE 148* 122*  BUN 16 21  CREATININE 0.87 0.95  CALCIUM 9.4 9.6  GFRNONAA >60 >60  ANIONGAP 7 6      Hematology Recent Labs  Lab 12/19/21 0523 12/20/21 0158 12/21/21 0329  WBC 4.8 5.1 4.7  RBC 4.40 4.31 4.27  HGB 12.6* 12.7* 12.6*  HCT 38.0* 37.1* 36.8*  MCV 86.4 86.1 86.2  MCH 28.6 29.5 29.5  MCHC 33.2 34.2 34.2  RDW 12.6 12.4 12.6  PLT 128* 131* 126*     Cardiac EnzymesNo results for input(s): TROPONINI in the last 168 hours. No results for input(s): TROPIPOC in the last 168 hours.   BNPNo results for input(s): BNP, PROBNP in the last 168 hours.   DDimer No  results for input(s): DDIMER in the last 168 hours.   Radiology    VAS US DOPPLER PRE CABG  Result Date: 12/20/2021 PREOPERATIVE VASCULAR EVALUATION Patient Name:  Ronald Petersen  Date of Exam:   12/20/2021 Medical Rec #: [id number]         Accession #:    [id number] Date of Birth: [birth year]         Patient Gender: M Patient Age:   65 years Exam Location:  [facility] Procedure:      VAS US DOPPLER PRE CABG Referring Phys: HARRELL LIGHTFOOT --------------------------------------------------------------------------------  Indications:      Pre-CABG. Risk Factors:     Hypertension, hyperlipidemia, Diabetes, past history of                   smoking, coronary artery disease. Comparison Study: No prior studies. Performing Technologist: Darlin Coco RDMS RVT  Examination Guidelines: A complete evaluation includes B-mode imaging, spectral Doppler, color Doppler, and power Doppler as needed of all accessible portions of each vessel.  Bilateral testing is considered an integral part of a complete examination. Limited examinations for reoccurring indications may be performed as noted.  Right Carotid Findings: +----------+--------+--------+--------+--------+------------------+             PSV cm/s EDV cm/s Stenosis Describe Comments            +----------+--------+--------+--------+--------+------------------+  CCA Prox   94       13                                             +----------+--------+--------+--------+--------+------------------+  CCA Distal 62       12                         intimal thickening  +----------+--------+--------+--------+--------+------------------+  ICA Prox   71       30                                             +----------+--------+--------+--------+--------+------------------+  ICA Distal 109      39                                             +----------+--------+--------+--------+--------+------------------+  ECA        73       9                                              +----------+--------+--------+--------+--------+------------------+ +----------+--------+-------+----------------+------------+             PSV cm/s EDV cms Describe         Arm Pressure  +----------+--------+-------+----------------+------------+  Subclavian 92               Multiphasic, WNL               +----------+--------+-------+----------------+------------+ +---------+--------+--+--------+--+---------+  Vertebral PSV cm/s 81 EDV cm/s 22 Antegrade  +---------+--------+--+--------+--+---------+ Left Carotid Findings: +----------+--------+--------+--------+---------------------+------------------+  PSV cm/s EDV cm/s Stenosis Describe              Comments            +----------+--------+--------+--------+---------------------+------------------+  CCA Prox   87       22                                                          +----------+--------+--------+--------+---------------------+------------------+  CCA Distal 72        16                                      intimal thickening  +----------+--------+--------+--------+---------------------+------------------+  ICA Prox   95       37       1-39%    hyperechoic and                                                                  smooth                                    +----------+--------+--------+--------+---------------------+------------------+  ICA Distal 103      42                                                          +----------+--------+--------+--------+---------------------+------------------+  ECA        100      14                                                          +----------+--------+--------+--------+---------------------+------------------+ +----------+--------+--------+----------------+------------+  Subclavian PSV cm/s EDV cm/s Describe         Arm Pressure  +----------+--------+--------+----------------+------------+             82                Multiphasic, WNL               +----------+--------+--------+----------------+------------+ +---------+--------+--+--------+--+---------+  Vertebral PSV cm/s 44 EDV cm/s 14 Antegrade  +---------+--------+--+--------+--+---------+  ABI Findings: +---------+------------------+-----+---------+--------+  Right     Rt Pressure (mmHg) Index Waveform  Comment   +---------+------------------+-----+---------+--------+  Brachial  128                      triphasic           +---------+------------------+-----+---------+--------+  PTA       140                1.09  triphasic           +---------+------------------+-----+---------+--------+  DP  132                1.03  triphasic           +---------+------------------+-----+---------+--------+  Great Toe 95                 0.74  Normal              +---------+------------------+-----+---------+--------+ +---------+------------------+-----+---------+-------+  Left      Lt Pressure (mmHg) Index Waveform  Comment  +---------+------------------+-----+---------+-------+   Brachial  126                      triphasic          +---------+------------------+-----+---------+-------+  PTA       134                1.05  triphasic          +---------+------------------+-----+---------+-------+  DP        116                0.91  triphasic          +---------+------------------+-----+---------+-------+  Great Toe 109                0.85  Normal             +---------+------------------+-----+---------+-------+  Right Doppler Findings: +--------+--------+-----+---------+--------+  Site     Pressure Index Doppler   Comments  +--------+--------+-----+---------+--------+  Brachial 128            triphasic           +--------+--------+-----+---------+--------+  Radial                  triphasic           +--------+--------+-----+---------+--------+  Ulnar                   triphasic           +--------+--------+-----+---------+--------+  Left Doppler Findings: +--------+--------+-----+---------+--------+  Site     Pressure Index Doppler   Comments  +--------+--------+-----+---------+--------+  Brachial 126            triphasic           +--------+--------+-----+---------+--------+  Radial                  triphasic           +--------+--------+-----+---------+--------+  Ulnar                   triphasic           +--------+--------+-----+---------+--------+  Summary: Right Carotid: The extracranial vessels were near-normal with only minimal wall                thickening or plaque. Left Carotid: Velocities in the left ICA are consistent with a 1-39% stenosis. Vertebrals:  Bilateral vertebral arteries demonstrate antegrade flow. Subclavians: Normal flow hemodynamics were seen in bilateral subclavian              arteries. Right ABI: Resting right ankle-brachial index is within normal range. No evidence of significant right lower extremity arterial disease. The right toe-brachial index is normal. Left ABI: Resting left ankle-brachial index is within normal range. No evidence of significant left  lower extremity arterial disease. The left toe-brachial index is normal. Right Upper Extremity: Doppler waveforms remain within normal limits with right radial compression. Doppler waveforms remain within normal limits with right ulnar compression. Left Upper Extremity: Doppler  waveforms remain within normal limits with left radial compression. Doppler waveforms remain within normal limits with left ulnar compression.  Electronically signed by Jamelle Haring on 12/20/2021 at 2:42:33 PM.    Final     Cardiac Studies   LHC 12/16/2021   Mid LM to Ost LAD lesion is 40% stenosed with 40% stenosed side branch in Ost Cx.   Ost LAD to Prox LAD lesion is 95% stenosed.   Prox LAD to Mid LAD DES Stent is widely patent   Prox Cx- 1st Mrg DES STENT is widely patent   1st Diag lesion is 25% stenosed.   RPAV lesion is 50% stenosed.   -----------------------------------   The left ventricular systolic function is normal.  The left ventricular ejection fraction is 55-65% by visual estimate.   LV end diastolic pressure is normal.   There is no aortic valve stenosis.    TTE 12/16/2021 IMPRESSIONS   1. Left ventricular ejection fraction, by estimation, is 60 to 65%. The left ventricle has normal function. The left ventricle has no regional wall motion abnormalities. There is moderate asymmetric left ventricular hypertrophy of the basal-septal  segment. Left ventricular diastolic parameters are indeterminate.   2. Right ventricular systolic function is normal. The right ventricular size is normal. There is mildly elevated pulmonary artery systolic pressure. The estimated right ventricular systolic pressure is 73.5 mmHg.   3. Left atrial size was mildly dilated.   4. The mitral valve is normal in structure. Trivial mitral valve  regurgitation. No evidence of mitral stenosis.   5. The aortic valve is tricuspid. Aortic valve regurgitation is trivial.  Mild aortic valve stenosis. Vmax 2.2 m/s, MG 82mmHg, AVA 2.0  cm^2, DI 0.54   6. The inferior vena cava is dilated in size with <50% respiratory  variability, suggesting right atrial pressure of 15 mmHg.     Patient Profile     65 y.o. male with history of HTN, DM, HLD, obesity, OSA, CAD s/p stent presented for an elective LHC with severe LAD stenosis.  Assessment & Plan    1.  Coronary artery disease: CABG likely Tuesday.  Continue IV heparin.  Patient is currently chest pain-free.  2.  Hypertension: Currently well controlled  3.  Hyperlipidemia: Continue current statin.  Goal LDL less than 70.  4.  Obstructive sleep apnea: CPAP compliance encouraged  5.  Type 2 diabetes: Continue sliding scale insulin.   For questions or updates, please contact [facility] Please consult [email or web address] for contact info under Cardiology/STEMI.   Signed, Jasia Hiltunen Meredith Leeds, MD  12/21/2021, 10:48 AM

## 2021-12-21 NOTE — Progress Notes (Signed)
ANTICOAGULATION CONSULT NOTE  Pharmacy Consult for Heparin Indication: CAD awaiting CABG   Allergies  Allergen Reactions   Niacin Anaphylaxis    Patient Measurements: Height: 5\' 11"  (180.3 cm) Weight: 84.9 kg (187 lb 3.2 oz) IBW/kg (Calculated) : 75.3 Heparin Dosing Weight: 84 kg  Vital Signs: Temp: 98.5 F (36.9 C) (12/24 0656) Temp Source: Oral (12/24 0656) BP: 103/55 (12/24 0656) Pulse Rate: 55 (12/24 0656)  Labs: Recent Labs    12/19/21 0523 12/20/21 0158 12/21/21 0329  HGB 12.6* 12.7* 12.6*  HCT 38.0* 37.1* 36.8*  PLT 128* 131* 126*  HEPARINUNFRC 0.52 0.40 0.35  CREATININE  --  0.95  --      Estimated Creatinine Clearance: 81.5 mL/min (by C-G formula based on SCr of 0.95 mg/dL).   Assessment:  65 y.o. male with severe CAD s/p cath, awaiting CABG (planned for Tuesday 12/27). Pharmacy asked to dose heparin in the interim.   Heparin level today is 0.35 and therapeutic on 1400 units/h. Hgb and platelets remain stable. No signs of bleeding or infusion site issues per RN.   Goal of Therapy:  Heparin level 0.3-0.7 units/ml Monitor platelets by anticoagulation protocol: Yes   Plan:  Continue IV heparin at 1400 units/hr Daily heparin level and CBC Monitor for signs/symptoms of bleeding  Thank you for involving pharmacy in this patient's care.  Elita Quick, PharmD PGY1 Ambulatory Care Pharmacy Resident 12/21/2021 8:27 AM  **Pharmacist phone directory can be found on [email or web address] listed under [facility]**

## 2021-12-22 DIAGNOSIS — I25110 Atherosclerotic heart disease of native coronary artery with unstable angina pectoris: Secondary | ICD-10-CM | POA: Diagnosis not present

## 2021-12-22 LAB — GLUCOSE, CAPILLARY
Glucose-Capillary: 148 mg/dL — ABNORMAL HIGH (ref 70–99)
Glucose-Capillary: 132 mg/dL — ABNORMAL HIGH (ref 70–99)
Glucose-Capillary: 150 mg/dL — ABNORMAL HIGH (ref 70–99)
Glucose-Capillary: 195 mg/dL — ABNORMAL HIGH (ref 70–99)

## 2021-12-22 LAB — CBC
HCT: 37.5 % — ABNORMAL LOW (ref 39.0–52.0)
Hemoglobin: 13 g/dL (ref 13.0–17.0)
MCH: 29.7 pg (ref 26.0–34.0)
MCHC: 34.7 g/dL (ref 30.0–36.0)
MCV: 85.8 fL (ref 80.0–100.0)
Platelets: 119 10*3/uL — ABNORMAL LOW (ref 150–400)
RBC: 4.37 MIL/uL (ref 4.22–5.81)
RDW: 12.5 % (ref 11.5–15.5)
WBC: 3.8 10*3/uL — ABNORMAL LOW (ref 4.0–10.5)
nRBC: 0 % (ref 0.0–0.2)

## 2021-12-22 LAB — HEPARIN LEVEL (UNFRACTIONATED): Heparin Unfractionated: 0.4 IU/mL (ref 0.30–0.70)

## 2021-12-22 NOTE — Progress Notes (Signed)
ANTICOAGULATION CONSULT NOTE - Follow Up Consult  Pharmacy Consult for Heparin Indication:  CAD awaiting CABG  Allergies  Allergen Reactions   Niacin Anaphylaxis    Patient Measurements: Height: 5\' 11"  (180.3 cm) Weight: 84.9 kg (187 lb 3.2 oz) IBW/kg (Calculated) : 75.3 Heparin Dosing Weight: 84.9 kg  Vital Signs: Temp: 97.9 F (36.6 C) (12/24 2043) Temp Source: Oral (12/24 2043) BP: 131/81 (12/24 2043)  Labs: Recent Labs    12/20/21 0158 12/21/21 0329 12/22/21 0318  HGB 12.7* 12.6* 13.0  HCT 37.1* 36.8* 37.5*  PLT 131* 126* 119*  HEPARINUNFRC 0.40 0.35 0.40  CREATININE 0.95  --   --     Estimated Creatinine Clearance: 81.5 mL/min (by C-G formula based on SCr of 0.95 mg/dL).   Medications:  Scheduled:   aspirin EC  81 mg Oral Daily   fenofibrate  160 mg Oral Daily   gabapentin  800 mg Oral TID   insulin aspart  0-15 Units Subcutaneous TID WC   magnesium oxide  400 mg Oral Daily   nitroGLYCERIN  0.1 mg Transdermal Daily   pantoprazole  40 mg Oral BID AC   polyethylene glycol  17 g Oral Daily   ranolazine  1,000 mg Oral BID   rosuvastatin  40 mg Oral Daily   sertraline  150 mg Oral Daily   sodium chloride flush  3 mL Intravenous Q12H   sodium chloride flush  3 mL Intravenous Q12H   traZODone  50 mg Oral QHS   vitamin B-12  2,500 mcg Oral Daily   Vitamin E  1,600 Units Oral Daily   Infusions:   sodium chloride     heparin 1,400 Units/hr (12/22/21 0342)    Assessment: 65 yo M with severe CAD s/p cath, awaiting CABG (planned for Tuesday 12/27). Pharmacy consulted for heparin dosing.   Heparin level today is therapeutic at 0.40, on 1400 units/hr. Hgb 13, plt 119. No line issues or signs/symptoms of bleeding noted per RN.  Goal of Therapy:  Heparin level 0.3-0.7 units/ml Monitor platelets by anticoagulation protocol: Yes   Plan:  Continue heparin gtt at 1400 units/hr. Daily CBC, heparin level. Monitor for signs/symptoms of bleeding.   Vance Peper, PharmD PGY1 Pharmacy Resident Phone [phone] 12/22/2021 7:23 AM   Please check AMION for all [facility] phone numbers After 10:00 PM, call [place] [phone]

## 2021-12-22 NOTE — Progress Notes (Signed)
Progress Note  Patient Name: Ronald Petersen Date of Encounter: 12/22/2021  Primary Cardiologist:   Berniece Salines, DO   Subjective   Currently feeling well.  No chest pain or shortness of breath.  Ready for surgery on Tuesday.  Inpatient Medications    Scheduled Meds:  aspirin EC  81 mg Oral Daily   fenofibrate  160 mg Oral Daily   gabapentin  800 mg Oral TID   insulin aspart  0-15 Units Subcutaneous TID WC   magnesium oxide  400 mg Oral Daily   nitroGLYCERIN  0.1 mg Transdermal Daily   pantoprazole  40 mg Oral BID AC   polyethylene glycol  17 g Oral Daily   ranolazine  1,000 mg Oral BID   rosuvastatin  40 mg Oral Daily   sertraline  150 mg Oral Daily   sodium chloride flush  3 mL Intravenous Q12H   sodium chloride flush  3 mL Intravenous Q12H   traZODone  50 mg Oral QHS   vitamin B-12  2,500 mcg Oral Daily   Vitamin E  1,600 Units Oral Daily   Continuous Infusions:  sodium chloride     heparin 1,400 Units/hr (12/22/21 0342)   PRN Meds: sodium chloride, acetaminophen, dicyclomine, morphine injection, nitroGLYCERIN, ondansetron (ZOFRAN) IV, oxyCODONE, sodium chloride flush, tiZANidine   Vital Signs    Vitals:   12/20/21 2223 12/21/21 0656 12/21/21 1158 12/21/21 2043  BP: 122/75 (!) 103/55 111/70 131/81  Pulse: (!) 56 (!) 55 (!) 55   Resp: 18 17 16 14   Temp: (!) 97.4 F (36.3 C) 98.5 F (36.9 C) 98.1 F (36.7 C) 97.9 F (36.6 C)  TempSrc: Oral Oral Oral Oral  SpO2: 99% 98% 97% 96%  Weight:      Height:       No intake or output data in the 24 hours ending 12/22/21 0829  Filed Weights   12/16/21 0557 12/16/21 1523  Weight: 83.9 kg 84.9 kg    Telemetry    Sinus rhythm, PVCs  ECG    None new  Physical Exam   GEN: Well nourished, well developed, in no acute distress  HEENT: normal  Neck: no JVD, carotid bruits, or masses Cardiac: RRR; no murmurs, rubs, or gallops,no edema  Respiratory:  clear to auscultation bilaterally, normal work of  breathing GI: soft, nontender, nondistended, + BS MS: no deformity or atrophy  Skin: warm and dry Neuro:  Strength and sensation are intact Psych: euthymic mood, full affect   Labs    Chemistry Recent Labs  Lab 12/17/21 0934 12/20/21 0158  NA 135 136  K 3.7 4.2  CL 103 102  CO2 25 28  GLUCOSE 148* 122*  BUN 16 21  CREATININE 0.87 0.95  CALCIUM 9.4 9.6  GFRNONAA >60 >60  ANIONGAP 7 6      Hematology Recent Labs  Lab 12/20/21 0158 12/21/21 0329 12/22/21 0318  WBC 5.1 4.7 3.8*  RBC 4.31 4.27 4.37  HGB 12.7* 12.6* 13.0  HCT 37.1* 36.8* 37.5*  MCV 86.1 86.2 85.8  MCH 29.5 29.5 29.7  MCHC 34.2 34.2 34.7  RDW 12.4 12.6 12.5  PLT 131* 126* 119*     Cardiac EnzymesNo results for input(s): TROPONINI in the last 168 hours. No results for input(s): TROPIPOC in the last 168 hours.   BNPNo results for input(s): BNP, PROBNP in the last 168 hours.   DDimer No results for input(s): DDIMER in the last 168 hours.   Radiology  VAS US DOPPLER PRE CABG  Result Date: 12/20/2021 PREOPERATIVE VASCULAR EVALUATION Patient Name:  Ronald Petersen  Date of Exam:   12/20/2021 Medical Rec #: [id number]         Accession #:    [id number] Date of Birth: 1956/04/12         Patient Gender: M Patient Age:   16 years Exam [facility]  [facility] Procedure:      VAS US DOPPLER PRE CABG Referring Phys: HARRELL LIGHTFOOT --------------------------------------------------------------------------------  Indications:      Pre-CABG. Risk Factors:     Hypertension, hyperlipidemia, Diabetes, past history of                   smoking, coronary artery disease. Comparison Study: No prior studies. Performing Technologist: Darlin Coco RDMS RVT  Examination Guidelines: A complete evaluation includes B-mode imaging, spectral Doppler, color Doppler, and power Doppler as needed of all accessible portions of each vessel. Bilateral testing is considered an integral part of a complete examination. Limited  examinations for reoccurring indications may be performed as noted.  Right Carotid Findings: +----------+--------+--------+--------+--------+------------------+             PSV cm/s EDV cm/s Stenosis Describe Comments            +----------+--------+--------+--------+--------+------------------+  CCA Prox   94       13                                             +----------+--------+--------+--------+--------+------------------+  CCA Distal 62       12                         intimal thickening  +----------+--------+--------+--------+--------+------------------+  ICA Prox   71       30                                             +----------+--------+--------+--------+--------+------------------+  ICA Distal 109      39                                             +----------+--------+--------+--------+--------+------------------+  ECA        73       9                                              +----------+--------+--------+--------+--------+------------------+ +----------+--------+-------+----------------+------------+             PSV cm/s EDV cms Describe         Arm Pressure  +----------+--------+-------+----------------+------------+  Subclavian 92               Multiphasic, WNL               +----------+--------+-------+----------------+------------+ +---------+--------+--+--------+--+---------+  Vertebral PSV cm/s 81 EDV cm/s 22 Antegrade  +---------+--------+--+--------+--+---------+ Left Carotid Findings: +----------+--------+--------+--------+---------------------+------------------+             PSV cm/s EDV cm/s Stenosis Describe  Comments            +----------+--------+--------+--------+---------------------+------------------+  CCA Prox   87       22                                                          +----------+--------+--------+--------+---------------------+------------------+  CCA Distal 72       16                                      intimal thickening   +----------+--------+--------+--------+---------------------+------------------+  ICA Prox   95       37       1-39%    hyperechoic and                                                                  smooth                                    +----------+--------+--------+--------+---------------------+------------------+  ICA Distal 103      42                                                          +----------+--------+--------+--------+---------------------+------------------+  ECA        100      14                                                          +----------+--------+--------+--------+---------------------+------------------+ +----------+--------+--------+----------------+------------+  Subclavian PSV cm/s EDV cm/s Describe         Arm Pressure  +----------+--------+--------+----------------+------------+             82                Multiphasic, WNL               +----------+--------+--------+----------------+------------+ +---------+--------+--+--------+--+---------+  Vertebral PSV cm/s 44 EDV cm/s 14 Antegrade  +---------+--------+--+--------+--+---------+  ABI Findings: +---------+------------------+-----+---------+--------+  Right     Rt Pressure (mmHg) Index Waveform  Comment   +---------+------------------+-----+---------+--------+  Brachial  128                      triphasic           +---------+------------------+-----+---------+--------+  PTA       140                1.09  triphasic           +---------+------------------+-----+---------+--------+  DP        132  1.03  triphasic           +---------+------------------+-----+---------+--------+  Great Toe 95                 0.74  Normal              +---------+------------------+-----+---------+--------+ +---------+------------------+-----+---------+-------+  Left      Lt Pressure (mmHg) Index Waveform  Comment  +---------+------------------+-----+---------+-------+  Brachial  126                      triphasic           +---------+------------------+-----+---------+-------+  PTA       134                1.05  triphasic          +---------+------------------+-----+---------+-------+  DP        116                0.91  triphasic          +---------+------------------+-----+---------+-------+  Great Toe 109                0.85  Normal             +---------+------------------+-----+---------+-------+  Right Doppler Findings: +--------+--------+-----+---------+--------+  Site     Pressure Index Doppler   Comments  +--------+--------+-----+---------+--------+  Brachial 128            triphasic           +--------+--------+-----+---------+--------+  Radial                  triphasic           +--------+--------+-----+---------+--------+  Ulnar                   triphasic           +--------+--------+-----+---------+--------+  Left Doppler Findings: +--------+--------+-----+---------+--------+  Site     Pressure Index Doppler   Comments  +--------+--------+-----+---------+--------+  Brachial 126            triphasic           +--------+--------+-----+---------+--------+  Radial                  triphasic           +--------+--------+-----+---------+--------+  Ulnar                   triphasic           +--------+--------+-----+---------+--------+  Summary: Right Carotid: The extracranial vessels were near-normal with only minimal wall                thickening or plaque. Left Carotid: Velocities in the left ICA are consistent with a 1-39% stenosis. Vertebrals:  Bilateral vertebral arteries demonstrate antegrade flow. Subclavians: Normal flow hemodynamics were seen in bilateral subclavian              arteries. Right ABI: Resting right ankle-brachial index is within normal range. No evidence of significant right lower extremity arterial disease. The right toe-brachial index is normal. Left ABI: Resting left ankle-brachial index is within normal range. No evidence of significant left lower extremity arterial disease. The left toe-brachial  index is normal. Right Upper Extremity: Doppler waveforms remain within normal limits with right radial compression. Doppler waveforms remain within normal limits with right ulnar compression. Left Upper Extremity: Doppler waveforms remain within normal limits with left radial compression. Doppler waveforms remain within normal limits with  left ulnar compression.  Electronically signed by Jamelle Haring on 12/20/2021 at 2:42:33 PM.    Final     Cardiac Studies   LHC 12/16/2021   Mid LM to Ost LAD lesion is 40% stenosed with 40% stenosed side branch in Ost Cx.   Ost LAD to Prox LAD lesion is 95% stenosed.   Prox LAD to Mid LAD DES Stent is widely patent   Prox Cx- 1st Mrg DES STENT is widely patent   1st Diag lesion is 25% stenosed.   RPAV lesion is 50% stenosed.   -----------------------------------   The left ventricular systolic function is normal.  The left ventricular ejection fraction is 55-65% by visual estimate.   LV end diastolic pressure is normal.   There is no aortic valve stenosis.    TTE 12/16/2021 IMPRESSIONS   1. Left ventricular ejection fraction, by estimation, is 60 to 65%. The left ventricle has normal function. The left ventricle has no regional wall motion abnormalities. There is moderate asymmetric left ventricular hypertrophy of the basal-septal  segment. Left ventricular diastolic parameters are indeterminate.   2. Right ventricular systolic function is normal. The right ventricular size is normal. There is mildly elevated pulmonary artery systolic pressure. The estimated right ventricular systolic pressure is 91.9 mmHg.   3. Left atrial size was mildly dilated.   4. The mitral valve is normal in structure. Trivial mitral valve  regurgitation. No evidence of mitral stenosis.   5. The aortic valve is tricuspid. Aortic valve regurgitation is trivial.  Mild aortic valve stenosis. Vmax 2.2 m/s, MG 53mmHg, AVA 2.0 cm^2, DI 0.54   6. The inferior vena cava is dilated in  size with <50% respiratory  variability, suggesting right atrial pressure of 15 mmHg.     Patient Profile     65 y.o. male with history of HTN, DM, HLD, obesity, OSA, CAD s/p stent presented for an elective LHC with severe LAD stenosis.  Assessment & Plan    1.  Coronary artery disease: CABG likely Tuesday.  Continue IV heparin.  Patient chest pain-free.  No other changes.  2.  Hypertension: Currently well controlled  3.  Hyperlipidemia: Continue Crestor 40 mg.  Goal LDL less than 70.  4.  Obstructive sleep apnea: CPAP compliance encouraged  5.  Type 2 diabetes: Sliding scale insulin.    For questions or updates, please contact [place] Please consult [email or web address] for contact info under Cardiology/STEMI.   Signed, Brentin Shin Meredith Leeds, MD  12/22/2021, 8:29 AM

## 2021-12-22 NOTE — Progress Notes (Signed)
Spoke with [place] with cardiology and advised pt is having bigeminy. Pt is asymptomatic.

## 2021-12-23 ENCOUNTER — Inpatient Hospital Stay (HOSPITAL_COMMUNITY): Payer: Medicare HMO

## 2021-12-23 DIAGNOSIS — I25110 Atherosclerotic heart disease of native coronary artery with unstable angina pectoris: Secondary | ICD-10-CM | POA: Diagnosis not present

## 2021-12-23 LAB — URINALYSIS, ROUTINE W REFLEX MICROSCOPIC
Bilirubin Urine: NEGATIVE
Glucose, UA: 250 mg/dL — AB
Hgb urine dipstick: NEGATIVE
Ketones, ur: NEGATIVE mg/dL
Leukocytes,Ua: NEGATIVE
Nitrite: NEGATIVE
Protein, ur: NEGATIVE mg/dL
Specific Gravity, Urine: 1.015 (ref 1.005–1.030)
pH: 7.5 (ref 5.0–8.0)

## 2021-12-23 LAB — BLOOD GAS, ARTERIAL
Acid-Base Excess: 2.5 mmol/L — ABNORMAL HIGH (ref 0.0–2.0)
Bicarbonate: 26.6 mmol/L (ref 20.0–28.0)
FIO2: 21
O2 Saturation: 95.1 %
Patient temperature: 36.5
pCO2 arterial: 40.7 mmHg (ref 32.0–48.0)
pH, Arterial: 7.428 (ref 7.350–7.450)
pO2, Arterial: 87.9 mmHg (ref 83.0–108.0)

## 2021-12-23 LAB — GLUCOSE, CAPILLARY
Glucose-Capillary: 107 mg/dL — ABNORMAL HIGH (ref 70–99)
Glucose-Capillary: 177 mg/dL — ABNORMAL HIGH (ref 70–99)
Glucose-Capillary: 124 mg/dL — ABNORMAL HIGH (ref 70–99)
Glucose-Capillary: 158 mg/dL — ABNORMAL HIGH (ref 70–99)

## 2021-12-23 LAB — BASIC METABOLIC PANEL
Anion gap: 10 (ref 5–15)
BUN: 20 mg/dL (ref 8–23)
CO2: 26 mmol/L (ref 22–32)
Calcium: 9.6 mg/dL (ref 8.9–10.3)
Chloride: 102 mmol/L (ref 98–111)
Creatinine, Ser: 1.09 mg/dL (ref 0.61–1.24)
GFR, Estimated: >60 mL/min (ref >60)
Glucose, Bld: 113 mg/dL — ABNORMAL HIGH (ref 70–99)
Potassium: 4.5 mmol/L (ref 3.5–5.1)
Sodium: 138 mmol/L (ref 135–145)

## 2021-12-23 LAB — HEMOGLOBIN A1C
Hgb A1c MFr Bld: 6.2 % — ABNORMAL HIGH (ref 4.8–5.6)
Mean Plasma Glucose: 131.24 mg/dL

## 2021-12-23 LAB — ABO/RH: ABO/RH(D): O POS

## 2021-12-23 LAB — SURGICAL PCR SCREEN
MRSA, PCR: NEGATIVE
Staphylococcus aureus: NEGATIVE

## 2021-12-23 LAB — CBC
HCT: 37.3 % — ABNORMAL LOW (ref 39.0–52.0)
Hemoglobin: 12.8 g/dL — ABNORMAL LOW (ref 13.0–17.0)
MCH: 29.8 pg (ref 26.0–34.0)
MCHC: 34.3 g/dL (ref 30.0–36.0)
MCV: 86.7 fL (ref 80.0–100.0)
Platelets: 124 10*3/uL — ABNORMAL LOW (ref 150–400)
RBC: 4.3 MIL/uL (ref 4.22–5.81)
RDW: 12.6 % (ref 11.5–15.5)
WBC: 4.7 10*3/uL (ref 4.0–10.5)
nRBC: 0 % (ref 0.0–0.2)

## 2021-12-23 LAB — PROTIME-INR
INR: 1 (ref 0.8–1.2)
Prothrombin Time: 13.6 seconds (ref 11.4–15.2)

## 2021-12-23 LAB — PREPARE RBC (CROSSMATCH)

## 2021-12-23 LAB — APTT: aPTT: 102 seconds — ABNORMAL HIGH (ref 24–36)

## 2021-12-23 LAB — HEPARIN LEVEL (UNFRACTIONATED): Heparin Unfractionated: 0.53 IU/mL (ref 0.30–0.70)

## 2021-12-23 MED ORDER — CHLORHEXIDINE GLUCONATE CLOTH 2 % EX PADS
6.0000 | MEDICATED_PAD | Freq: Once | CUTANEOUS | Status: AC
  Administered 2021-12-23: 22:00:00 6 via TOPICAL

## 2021-12-23 MED ORDER — TRANEXAMIC ACID 1000 MG/10ML IV SOLN
1.5000 mg/kg/h | INTRAVENOUS | Status: AC
  Administered 2021-12-24: 13:00:00 1.5 mg/kg/h via INTRAVENOUS
  Filled 2021-12-23: qty 25

## 2021-12-23 MED ORDER — CHLORHEXIDINE GLUCONATE CLOTH 2 % EX PADS
6.0000 | MEDICATED_PAD | Freq: Once | CUTANEOUS | Status: AC
  Administered 2021-12-24: 10:00:00 6 via TOPICAL

## 2021-12-23 MED ORDER — VANCOMYCIN HCL 1500 MG/300ML IV SOLN
1500.0000 mg | INTRAVENOUS | Status: AC
  Administered 2021-12-24: 12:00:00 1500 mg via INTRAVENOUS
  Filled 2021-12-23: qty 300

## 2021-12-23 MED ORDER — HEPARIN 30,000 UNITS/1000 ML (OHS) CELLSAVER SOLUTION
Status: DC
  Filled 2021-12-23: qty 1000

## 2021-12-23 MED ORDER — BISACODYL 5 MG PO TBEC
5.0000 mg | DELAYED_RELEASE_TABLET | Freq: Once | ORAL | Status: AC
  Administered 2021-12-23: 10:00:00 5 mg via ORAL
  Filled 2021-12-23: qty 1

## 2021-12-23 MED ORDER — MANNITOL 20 % IV SOLN
INTRAVENOUS | Status: DC
  Filled 2021-12-23: qty 13

## 2021-12-23 MED ORDER — CEFAZOLIN SODIUM-DEXTROSE 2-4 GM/100ML-% IV SOLN
2.0000 g | INTRAVENOUS | Status: AC
  Administered 2021-12-24 (×2): 2 g via INTRAVENOUS
  Filled 2021-12-23: qty 100

## 2021-12-23 MED ORDER — NITROGLYCERIN IN D5W 200-5 MCG/ML-% IV SOLN
2.0000 ug/min | INTRAVENOUS | Status: AC
  Administered 2021-12-24: 14:00:00 10 ug/min via INTRAVENOUS
  Filled 2021-12-23: qty 250

## 2021-12-23 MED ORDER — INSULIN REGULAR(HUMAN) IN NACL 100-0.9 UT/100ML-% IV SOLN
INTRAVENOUS | Status: AC
  Administered 2021-12-24: 12:00:00 2.6 [IU]/h via INTRAVENOUS
  Filled 2021-12-23: qty 100

## 2021-12-23 MED ORDER — NOREPINEPHRINE 4 MG/250ML-% IV SOLN
0.0000 ug/min | INTRAVENOUS | Status: DC
  Filled 2021-12-23: qty 250

## 2021-12-23 MED ORDER — CHLORHEXIDINE GLUCONATE 0.12 % MT SOLN
15.0000 mL | Freq: Once | OROMUCOSAL | Status: AC
  Administered 2021-12-24: 10:00:00 15 mL via OROMUCOSAL
  Filled 2021-12-23: qty 15

## 2021-12-23 MED ORDER — PHENYLEPHRINE HCL-NACL 20-0.9 MG/250ML-% IV SOLN
30.0000 ug/min | INTRAVENOUS | Status: AC
  Administered 2021-12-24: 12:00:00 25 ug/min via INTRAVENOUS
  Filled 2021-12-23: qty 250

## 2021-12-23 MED ORDER — EPINEPHRINE HCL 5 MG/250ML IV SOLN IN NS
0.0000 ug/min | INTRAVENOUS | Status: DC
  Filled 2021-12-23: qty 250

## 2021-12-23 MED ORDER — CEFAZOLIN SODIUM-DEXTROSE 2-4 GM/100ML-% IV SOLN
2.0000 g | INTRAVENOUS | Status: DC
  Filled 2021-12-23: qty 100

## 2021-12-23 MED ORDER — DEXMEDETOMIDINE HCL IN NACL 400 MCG/100ML IV SOLN
0.1000 ug/kg/h | INTRAVENOUS | Status: AC
Start: 2021-12-24 — End: 2021-12-24
  Administered 2021-12-24: 14:00:00 .7 ug/kg/h via INTRAVENOUS
  Filled 2021-12-23: qty 100

## 2021-12-23 MED ORDER — METOPROLOL TARTRATE 12.5 MG HALF TABLET
12.5000 mg | ORAL_TABLET | Freq: Once | ORAL | Status: DC
  Filled 2021-12-23: qty 1

## 2021-12-23 MED ORDER — PLASMA-LYTE A IV SOLN
INTRAVENOUS | Status: DC
  Filled 2021-12-23: qty 5

## 2021-12-23 MED ORDER — TRANEXAMIC ACID (OHS) BOLUS VIA INFUSION
15.0000 mg/kg | INTRAVENOUS | Status: AC
  Administered 2021-12-24: 12:00:00 1186.5 mg via INTRAVENOUS
  Filled 2021-12-23: qty 1187

## 2021-12-23 MED ORDER — MILRINONE LACTATE IN DEXTROSE 20-5 MG/100ML-% IV SOLN
0.3000 ug/kg/min | INTRAVENOUS | Status: DC
  Filled 2021-12-23: qty 100

## 2021-12-23 MED ORDER — POTASSIUM CHLORIDE 2 MEQ/ML IV SOLN
80.0000 meq | INTRAVENOUS | Status: DC
  Filled 2021-12-23: qty 40

## 2021-12-23 MED ORDER — TEMAZEPAM 15 MG PO CAPS: 15.0000 mg | ORAL_CAPSULE | Freq: Once | ORAL | Status: DC | PRN

## 2021-12-23 MED ORDER — TRANEXAMIC ACID (OHS) PUMP PRIME SOLUTION
2.0000 mg/kg | INTRAVENOUS | Status: DC
  Filled 2021-12-23: qty 1.7

## 2021-12-23 NOTE — Care Management Important Message (Signed)
Important Message  Patient Details  Name: Ronald Petersen MRN: [id number] Date of Birth: 04/17/1956   Medicare Important Message Given:  Yes     [name] 12/23/2021, 10:35 AM

## 2021-12-23 NOTE — Progress Notes (Incomplete)
Progress Note  Patient Name: Ronald Petersen Date of Encounter: 12/23/2021  [facility] Cardiologist: Berniece Salines, DO ***  Subjective   ***  Inpatient Medications    Scheduled Meds:  aspirin EC  81 mg Oral Daily   [START ON 12/24/2021] chlorhexidine  15 mL Mouth/Throat Once   Chlorhexidine Gluconate Cloth  6 each Topical Once   And   Chlorhexidine Gluconate Cloth  6 each Topical Once   [START ON 12/24/2021] epinephrine  0-10 mcg/min Intravenous To OR   fenofibrate  160 mg Oral Daily   gabapentin  800 mg Oral TID   [START ON 12/24/2021] heparin-papaverine-plasmalyte irrigation   Irrigation To OR   insulin aspart  0-15 Units Subcutaneous TID WC   [START ON 12/24/2021] insulin   Intravenous To OR   [START ON 12/24/2021] [place] Blood Cardioplegia vial (lidocaine/magnesium/mannitol 0.26g-4g-6.4g)   Intracoronary To OR   magnesium oxide  400 mg Oral Daily   [START ON 12/24/2021] metoprolol tartrate  12.5 mg Oral Once   nitroGLYCERIN  0.1 mg Transdermal Daily   pantoprazole  40 mg Oral BID AC   [START ON 12/24/2021] phenylephrine  30-200 mcg/min Intravenous To OR   polyethylene glycol  17 g Oral Daily   [START ON 12/24/2021] potassium chloride  80 mEq Other To OR   ranolazine  1,000 mg Oral BID   rosuvastatin  40 mg Oral Daily   sertraline  150 mg Oral Daily   sodium chloride flush  3 mL Intravenous Q12H   sodium chloride flush  3 mL Intravenous Q12H   [START ON 12/24/2021] tranexamic acid  15 mg/kg (Adjusted) Intravenous To OR   [START ON 12/24/2021] tranexamic acid  2 mg/kg Intracatheter To OR   traZODone  50 mg Oral QHS   vitamin B-12  2,500 mcg Oral Daily   Vitamin E  1,600 Units Oral Daily   Continuous Infusions:  sodium chloride     [START ON 12/24/2021]  ceFAZolin (ANCEF) IV     [START ON 12/24/2021]  ceFAZolin (ANCEF) IV     [START ON 12/24/2021] dexmedetomidine     [START ON 12/24/2021] heparin 30,000 units/NS 1000 mL solution for CELLSAVER     heparin  1,400 Units/hr (12/22/21 2200)   [START ON 12/24/2021] milrinone     [START ON 12/24/2021] nitroGLYCERIN     [START ON 12/24/2021] norepinephrine     [START ON 12/24/2021] tranexamic acid (CYKLOKAPRON) infusion ([facility])     [START ON 12/24/2021] vancomycin     PRN Meds: sodium chloride, acetaminophen, dicyclomine, morphine injection, nitroGLYCERIN, ondansetron (ZOFRAN) IV, oxyCODONE, sodium chloride flush, temazepam, tiZANidine   Vital Signs    Vitals:   12/22/21 1150 12/22/21 2003 12/23/21 0043 12/23/21 0518  BP: 113/66 122/72 (!) 106/55 (!) 97/59  Pulse: 62 62 (!) 46 (!) 56  Resp: 16     Temp: 97.9 F (36.6 C) 97.6 F (36.4 C) 98 F (36.7 C) 97.7 F (36.5 C)  TempSrc: Oral Oral Oral Oral  SpO2: 100% 98% 97% 99%  Weight:      Height:        Intake/Output Summary (Last 24 hours) at 12/23/2021 1655 Last data filed at 12/23/2021 0956 Gross per 24 hour  Intake 1345.86 ml  Output --  Net 1345.86 ml   Last 3 Weights 12/16/2021 12/16/2021 12/09/2021  Weight (lbs) 187 lb 3.2 oz 185 lb 186 lb  Weight (kg) 84.913 kg 83.915 kg 84.369 kg      Telemetry    *** -  Personally Reviewed  ECG    *** - Personally Reviewed  Physical Exam  *** GEN: No acute distress.   Neck: No JVD Cardiac: RRR, no murmurs, rubs, or gallops.  Respiratory: Clear to auscultation bilaterally. GI: Soft, nontender, non-distended  MS: No edema; No deformity. Neuro:  Nonfocal  Psych: Normal affect   Labs    High Sensitivity Troponin:  No results for input(s): TROPONINIHS in the last 720 hours.   Chemistry Recent Labs  Lab 12/17/21 0934 12/20/21 0158 12/23/21 0509  NA 135 136 138  K 3.7 4.2 4.5  CL 103 102 102  CO2 25 28 26   GLUCOSE 148* 122* 113*  BUN 16 21 20   CREATININE 0.87 0.95 1.09  CALCIUM 9.4 9.6 9.6  GFRNONAA >60 >60 >60  ANIONGAP 7 6 10     Lipids No results for input(s): CHOL, TRIG, HDL, LABVLDL, LDLCALC, CHOLHDL in the last 168 hours.  Hematology Recent Labs  Lab  12/21/21 0329 12/22/21 0318 12/23/21 0509  WBC 4.7 3.8* 4.7  RBC 4.27 4.37 4.30  HGB 12.6* 13.0 12.8*  HCT 36.8* 37.5* 37.3*  MCV 86.2 85.8 86.7  MCH 29.5 29.7 29.8  MCHC 34.2 34.7 34.3  RDW 12.6 12.5 12.6  PLT 126* 119* 124*   Thyroid No results for input(s): TSH, FREET4 in the last 168 hours.  BNPNo results for input(s): BNP, PROBNP in the last 168 hours.  DDimer No results for input(s): DDIMER in the last 168 hours.   Radiology    DG Chest 2 View  Result Date: 12/23/2021 CLINICAL DATA:  Preop EXAM: CHEST - 2 VIEW COMPARISON:  04/25/2021 FINDINGS: Heart and mediastinal contours are within normal limits. No focal opacities or effusions. No acute bony abnormality. IMPRESSION: No active cardiopulmonary disease. Electronically Signed   By: Rolm Baptise M.D.   On: 12/23/2021 16:52    Cardiac Studies   LHC 12/16/2021   Mid LM to Ost LAD lesion is 40% stenosed with 40% stenosed side branch in Ost Cx.   Ost LAD to Prox LAD lesion is 95% stenosed.   Prox LAD to Mid LAD DES Stent is widely patent   Prox Cx- 1st Mrg DES STENT is widely patent   1st Diag lesion is 25% stenosed.   RPAV lesion is 50% stenosed.   -----------------------------------   The left ventricular systolic function is normal.  The left ventricular ejection fraction is 55-65% by visual estimate.   LV end diastolic pressure is normal.   There is no aortic valve stenosis.     TTE 12/16/2021 IMPRESSIONS   1. Left ventricular ejection fraction, by estimation, is 60 to 65%. The left ventricle has normal function. The left ventricle has no regional wall motion abnormalities. There is moderate asymmetric left ventricular hypertrophy of the basal-septal  segment. Left ventricular diastolic parameters are indeterminate.   2. Right ventricular systolic function is normal. The right ventricular size is normal. There is mildly elevated pulmonary artery systolic pressure. The estimated right ventricular systolic pressure is  80.9 mmHg.   3. Left atrial size was mildly dilated.   4. The mitral valve is normal in structure. Trivial mitral valve  regurgitation. No evidence of mitral stenosis.   5. The aortic valve is tricuspid. Aortic valve regurgitation is trivial.  Mild aortic valve stenosis. Vmax 2.2 m/s, MG 25mmHg, AVA 2.0 cm^2, DI 0.54   6. The inferior vena cava is dilated in size with <50% respiratory  variability, suggesting right atrial pressure of 15 mmHg.  Patient Profile     65 y.o. male with history of HTN, DM, HLD, obesity, CAD s/p PCI who presented for elective LHC found to have severe LAD stenosis.  Assessment & Plan    #Coronary Artery Disease #Ostial  LAD Stenosis Cath with 95% ostial LAD. Plan for CABG tomorrow. -Continue heparin gtt -Continue ASA 81mg  daily -Continue crestor 40mg  daily  #HTN: Controlled. -Will add as able post-op  #HLD: -Continue crestor 40mg  daily -Continue fenofibrate 160mg  daily -Goal LDL<55  #OSA: -Contnue CPAP  For questions or updates, please contact [place] Please consult [email or web address] for contact info under        Signed, Freada Bergeron, MD  12/23/2021, 4:55 PM

## 2021-12-23 NOTE — Plan of Care (Signed)
Problem: Coping: Goal: Level of anxiety will decrease Outcome: Progressing   Problem: Safety: Goal: Ability to remain free from injury will improve Outcome: Completed/Met

## 2021-12-23 NOTE — Progress Notes (Signed)
Progress Note  Patient Name: Ronald Petersen Date of Encounter: 12/23/2021  Primary Cardiologist:   Berniece Salines, DO   Subjective   Feeling well. No chest pain or shortness of breath. Ready for CABG tomorrow.  Inpatient Medications    Scheduled Meds:  aspirin EC  81 mg Oral Daily   [START ON 12/24/2021] epinephrine  0-10 mcg/min Intravenous To OR   fenofibrate  160 mg Oral Daily   gabapentin  800 mg Oral TID   [START ON 12/24/2021] heparin-papaverine-plasmalyte irrigation   Irrigation To OR   insulin aspart  0-15 Units Subcutaneous TID WC   [START ON 12/24/2021] insulin   Intravenous To OR   [START ON 12/24/2021] [facility] Blood Cardioplegia vial (lidocaine/magnesium/mannitol 0.26g-4g-6.4g)   Intracoronary To OR   magnesium oxide  400 mg Oral Daily   nitroGLYCERIN  0.1 mg Transdermal Daily   pantoprazole  40 mg Oral BID AC   [START ON 12/24/2021] phenylephrine  30-200 mcg/min Intravenous To OR   polyethylene glycol  17 g Oral Daily   [START ON 12/24/2021] potassium chloride  80 mEq Other To OR   ranolazine  1,000 mg Oral BID   rosuvastatin  40 mg Oral Daily   sertraline  150 mg Oral Daily   sodium chloride flush  3 mL Intravenous Q12H   sodium chloride flush  3 mL Intravenous Q12H   [START ON 12/24/2021] tranexamic acid  15 mg/kg (Adjusted) Intravenous To OR   [START ON 12/24/2021] tranexamic acid  2 mg/kg Intracatheter To OR   traZODone  50 mg Oral QHS   vitamin B-12  2,500 mcg Oral Daily   Vitamin E  1,600 Units Oral Daily   Continuous Infusions:  sodium chloride     [START ON 12/24/2021]  ceFAZolin (ANCEF) IV     [START ON 12/24/2021]  ceFAZolin (ANCEF) IV     [START ON 12/24/2021] dexmedetomidine     [START ON 12/24/2021] heparin 30,000 units/NS 1000 mL solution for CELLSAVER     heparin 1,400 Units/hr (12/22/21 2200)   [START ON 12/24/2021] milrinone     [START ON 12/24/2021] nitroGLYCERIN     [START ON 12/24/2021] norepinephrine     [START ON 12/24/2021]  tranexamic acid (CYKLOKAPRON) infusion ([facility])     [START ON 12/24/2021] vancomycin     PRN Meds: sodium chloride, acetaminophen, dicyclomine, morphine injection, nitroGLYCERIN, ondansetron (ZOFRAN) IV, oxyCODONE, sodium chloride flush, tiZANidine   Vital Signs    Vitals:   12/22/21 1150 12/22/21 2003 12/23/21 0043 12/23/21 0518  BP: 113/66 122/72 (!) 106/55 (!) 97/59  Pulse: 62 62 (!) 46 (!) 56  Resp: 16     Temp: 97.9 F (36.6 C) 97.6 F (36.4 C) 98 F (36.7 C) 97.7 F (36.5 C)  TempSrc: Oral Oral Oral Oral  SpO2: 100% 98% 97% 99%  Weight:      Height:        Intake/Output Summary (Last 24 hours) at 12/23/2021 0919 Last data filed at 12/23/2021 0100 Gross per 24 hour  Intake 1582.86 ml  Output --  Net 1582.86 ml    Filed Weights   12/16/21 0557 12/16/21 1523  Weight: 83.9 kg 84.9 kg    Telemetry    Sinus rhythm - personally reviewed  ECG    None new  Physical Exam   GEN: Well nourished, well developed, in no acute distress  HEENT: normal  Neck: no JVD, carotid bruits, or masses Cardiac: RRR; no murmurs, rubs, or gallops,no edema  Respiratory:  clear to auscultation bilaterally, normal work of breathing GI: soft, nontender, nondistended, + BS MS: no deformity or atrophy  Skin: warm and dry Neuro:  Strength and sensation are intact Psych: euthymic mood, full affect   Labs    Chemistry Recent Labs  Lab 12/17/21 0934 12/20/21 0158 12/23/21 0509  NA 135 136 138  K 3.7 4.2 4.5  CL 103 102 102  CO2 25 28 26   GLUCOSE 148* 122* 113*  BUN 16 21 20   CREATININE 0.87 0.95 1.09  CALCIUM 9.4 9.6 9.6  GFRNONAA >60 >60 >60  ANIONGAP 7 6 10       Hematology Recent Labs  Lab 12/21/21 0329 12/22/21 0318 12/23/21 0509  WBC 4.7 3.8* 4.7  RBC 4.27 4.37 4.30  HGB 12.6* 13.0 12.8*  HCT 36.8* 37.5* 37.3*  MCV 86.2 85.8 86.7  MCH 29.5 29.7 29.8  MCHC 34.2 34.7 34.3  RDW 12.6 12.5 12.6  PLT 126* 119* 124*     Cardiac EnzymesNo results for input(s):  TROPONINI in the last 168 hours. No results for input(s): TROPIPOC in the last 168 hours.   BNPNo results for input(s): BNP, PROBNP in the last 168 hours.   DDimer No results for input(s): DDIMER in the last 168 hours.   Radiology    No results found.  Cardiac Studies   LHC 12/16/2021   Mid LM to Ost LAD lesion is 40% stenosed with 40% stenosed side branch in Ost Cx.   Ost LAD to Prox LAD lesion is 95% stenosed.   Prox LAD to Mid LAD DES Stent is widely patent   Prox Cx- 1st Mrg DES STENT is widely patent   1st Diag lesion is 25% stenosed.   RPAV lesion is 50% stenosed.   -----------------------------------   The left ventricular systolic function is normal.  The left ventricular ejection fraction is 55-65% by visual estimate.   LV end diastolic pressure is normal.   There is no aortic valve stenosis.    TTE 12/16/2021 IMPRESSIONS   1. Left ventricular ejection fraction, by estimation, is 60 to 65%. The left ventricle has normal function. The left ventricle has no regional wall motion abnormalities. There is moderate asymmetric left ventricular hypertrophy of the basal-septal  segment. Left ventricular diastolic parameters are indeterminate.   2. Right ventricular systolic function is normal. The right ventricular size is normal. There is mildly elevated pulmonary artery systolic pressure. The estimated right ventricular systolic pressure is 19.1 mmHg.   3. Left atrial size was mildly dilated.   4. The mitral valve is normal in structure. Trivial mitral valve  regurgitation. No evidence of mitral stenosis.   5. The aortic valve is tricuspid. Aortic valve regurgitation is trivial.  Mild aortic valve stenosis. Vmax 2.2 m/s, MG 11mmHg, AVA 2.0 cm^2, DI 0.54   6. The inferior vena cava is dilated in size with <50% respiratory  variability, suggesting right atrial pressure of 15 mmHg.     Patient Profile     65 y.o. male with history of HTN, DM, HLD, obesity, OSA, CAD s/p stent  presented for an elective LHC with severe LAD stenosis.  Assessment & Plan    Coronary artery disease: chest pain free. Continue IV heparin per pharmacy. Plan for CABG tomorrow. No further pain. Hypertension: well controlled Hyperlipidemia: continue Crestor 40 mg. Goal LDL less than 70 Obstructive sleep apnea: CPAP compliance encouraged Type 2 diabetes: continue sliding scale insulin  For questions or updates, please contact [place] Please consult [email or web address]  for contact info under Cardiology/STEMI.   Signed, Danaja Lasota Meredith Leeds, MD  12/23/2021, 9:19 AM

## 2021-12-23 NOTE — Anesthesia Preprocedure Evaluation (Addendum)
Anesthesia Evaluation  Patient identified by MRN, date of birth, ID band Patient awake    Reviewed: Allergy & Precautions, NPO status , Patient's Chart, lab work & pertinent test results  History of Anesthesia Complications (+) history of anesthetic complications  Airway Mallampati: II  TM Distance: >3 FB Neck ROM: Full    Dental  (+) Edentulous Upper, Dental Advisory Given   Pulmonary shortness of breath, sleep apnea , former smoker,    Pulmonary exam normal breath sounds clear to auscultation       Cardiovascular Exercise Tolerance: Good hypertension, Pt. on medications + angina + CAD   Rhythm:Regular Rate:Normal + Systolic murmurs Echo 02/5464 1. Left ventricular ejection fraction, by estimation, is 60 to 65%. The left ventricle has normal function. The left ventricle has no regional wall motion abnormalities. There is moderate asymmetric left ventricular hypertrophy of the basal-septal segment. Left ventricular diastolic parameters are indeterminate.  2. Right ventricular systolic function is normal. The right ventricular size is normal. There is mildly elevated pulmonary artery systolic pressure. The estimated right ventricular systolic pressure is 68.1 mmHg.  3. Left atrial size was mildly dilated.  4. The mitral valve is normal in structure. Trivial mitral valve regurgitation. No evidence of mitral stenosis.  5. The aortic valve is tricuspid. Aortic valve regurgitation is trivial. Mild aortic valve stenosis. Vmax 2.2 m/s, MG 32mmHg, AVA 2.0 cm^2, DI 0.54  6. The inferior vena cava is dilated in size with <50% respiratory variability, suggesting right atrial pressure of 15 mmHg.    Neuro/Psych PSYCHIATRIC DISORDERS Anxiety Depression  Neuromuscular disease    GI/Hepatic Neg liver ROS, GERD  Medicated,  Endo/Other  diabetes, Type 2  Renal/GU negative Renal ROS     Musculoskeletal negative musculoskeletal ROS (+)  Arthritis ,   Abdominal   Peds  Hematology negative hematology ROS (+)   Anesthesia Other Findings   Reproductive/Obstetrics                            Anesthesia Physical  Anesthesia Plan  ASA: 4  Anesthesia Plan: General   Post-op Pain Management:  Regional for Post-op pain   Induction: Intravenous  PONV Risk Score and Plan: 3 and Treatment may vary due to age or medical condition, Dexamethasone, Ondansetron and Midazolam  Airway Management Planned: Oral ETT  Additional Equipment: Arterial line, CVP, TEE and Ultrasound Guidance Line Placement  Intra-op Plan: Utilization Of Total Body Hypothermia per surgeon request  Post-operative Plan: Post-operative intubation/ventilation  Informed Consent: I have reviewed the patients History and Physical, chart, labs and discussed the procedure including the risks, benefits and alternatives for the proposed anesthesia with the patient or authorized representative who has indicated his/her understanding and acceptance.     Dental advisory given  Plan Discussed with: CRNA  Anesthesia Plan Comments:        Anesthesia Quick Evaluation

## 2021-12-23 NOTE — Progress Notes (Signed)
Pt called me to room for 8/10 R sided CP. States it has been going on most of the day-but got worse tonight. BP 100/59  MAP 71 HR 46-55 on tele. EKG obtained, 02 applied, and Morphine given per PRN medication. Pt states he is not sure if its actually muscle pain or not. Provider on call Dr Renella Cunas notified via [facility] of above. No EKG available in Epic to compare this one to.  Pain decreased to 4 with morphine Will continue to monitor closely.

## 2021-12-23 NOTE — Progress Notes (Signed)
ANTICOAGULATION CONSULT NOTE - Follow Up Consult  Pharmacy Consult for Heparin Indication:  CAD awaiting CABG  Allergies  Allergen Reactions   Niacin Anaphylaxis    Patient Measurements: Height: 5\' 11"  (180.3 cm) Weight: 84.9 kg (187 lb 3.2 oz) IBW/kg (Calculated) : 75.3 Heparin Dosing Weight: 84.9 kg  Vital Signs: Temp: 97.7 F (36.5 C) (12/26 0518) Temp Source: Oral (12/26 0518) BP: 97/59 (12/26 0518) Pulse Rate: 56 (12/26 0518)  Labs: Recent Labs    12/21/21 0329 12/22/21 0318 12/23/21 0509  HGB 12.6* 13.0 12.8*  HCT 36.8* 37.5* 37.3*  PLT 126* 119* 124*  HEPARINUNFRC 0.35 0.40 0.53  CREATININE  --   --  1.09     Estimated Creatinine Clearance: 71 mL/min (by C-G formula based on SCr of 1.09 mg/dL).   Medications:  Scheduled:   aspirin EC  81 mg Oral Daily   [START ON 12/24/2021] epinephrine  0-10 mcg/min Intravenous To OR   fenofibrate  160 mg Oral Daily   gabapentin  800 mg Oral TID   [START ON 12/24/2021] heparin-papaverine-plasmalyte irrigation   Irrigation To OR   insulin aspart  0-15 Units Subcutaneous TID WC   [START ON 12/24/2021] insulin   Intravenous To OR   [START ON 12/24/2021] [facility] Blood Cardioplegia vial (lidocaine/magnesium/mannitol 0.26g-4g-6.4g)   Intracoronary To OR   magnesium oxide  400 mg Oral Daily   nitroGLYCERIN  0.1 mg Transdermal Daily   pantoprazole  40 mg Oral BID AC   [START ON 12/24/2021] phenylephrine  30-200 mcg/min Intravenous To OR   polyethylene glycol  17 g Oral Daily   [START ON 12/24/2021] potassium chloride  80 mEq Other To OR   ranolazine  1,000 mg Oral BID   rosuvastatin  40 mg Oral Daily   sertraline  150 mg Oral Daily   sodium chloride flush  3 mL Intravenous Q12H   sodium chloride flush  3 mL Intravenous Q12H   [START ON 12/24/2021] tranexamic acid  15 mg/kg (Adjusted) Intravenous To OR   [START ON 12/24/2021] tranexamic acid  2 mg/kg Intracatheter To OR   traZODone  50 mg Oral QHS   vitamin B-12   2,500 mcg Oral Daily   Vitamin E  1,600 Units Oral Daily   Infusions:   sodium chloride     [START ON 12/24/2021]  ceFAZolin (ANCEF) IV     [START ON 12/24/2021]  ceFAZolin (ANCEF) IV     [START ON 12/24/2021] dexmedetomidine     [START ON 12/24/2021] heparin 30,000 units/NS 1000 mL solution for CELLSAVER     heparin 1,400 Units/hr (12/22/21 2200)   [START ON 12/24/2021] milrinone     [START ON 12/24/2021] nitroGLYCERIN     [START ON 12/24/2021] norepinephrine     [START ON 12/24/2021] tranexamic acid (CYKLOKAPRON) infusion ([facility])     [START ON 12/24/2021] vancomycin      Assessment: 65 yo M with severe CAD s/p cath, awaiting CABG (planned for Tuesday 12/27). Pharmacy consulted for heparin dosing.   Heparin level remains therapeutic, CBC stable, pltc low but stable.  Goal of Therapy:  Heparin level 0.3-0.7 units/ml Monitor platelets by anticoagulation protocol: Yes   Plan:  Continue heparin gtt at 1400 units/hr Daily CBC, heparin level Monitor for signs/symptoms of bleeding   Arrie Senate, PharmD, BCPS, [facility] Clinical Pharmacist [phone] Please check AMION for all [place] numbers 12/23/2021

## 2021-12-24 ENCOUNTER — Inpatient Hospital Stay (HOSPITAL_COMMUNITY): Payer: Medicare HMO

## 2021-12-24 ENCOUNTER — Encounter (HOSPITAL_COMMUNITY): Payer: Self-pay | Admitting: Cardiology

## 2021-12-24 ENCOUNTER — Inpatient Hospital Stay (HOSPITAL_COMMUNITY)
Admission: AD | Disposition: A | Discharge status: Home / Self Care | Payer: Self-pay | Source: Home / Self Care | Attending: Cardiology

## 2021-12-24 ENCOUNTER — Other Ambulatory Visit: Payer: Self-pay

## 2021-12-24 ENCOUNTER — Inpatient Hospital Stay (HOSPITAL_COMMUNITY): Payer: Medicare HMO | Admitting: Anesthesiology

## 2021-12-24 DIAGNOSIS — I25110 Atherosclerotic heart disease of native coronary artery with unstable angina pectoris: Secondary | ICD-10-CM | POA: Diagnosis not present

## 2021-12-24 DIAGNOSIS — Z9911 Dependence on respirator [ventilator] status: Secondary | ICD-10-CM | POA: Diagnosis not present

## 2021-12-24 DIAGNOSIS — I2510 Atherosclerotic heart disease of native coronary artery without angina pectoris: Secondary | ICD-10-CM

## 2021-12-24 DIAGNOSIS — Z951 Presence of aortocoronary bypass graft: Secondary | ICD-10-CM

## 2021-12-24 HISTORY — PX: ENDOVEIN HARVEST OF GREATER SAPHENOUS VEIN: SHX5059

## 2021-12-24 HISTORY — PX: CORONARY ARTERY BYPASS GRAFT: SHX141

## 2021-12-24 HISTORY — PX: TEE WITHOUT CARDIOVERSION: SHX5443

## 2021-12-24 LAB — POCT I-STAT, CHEM 8
BUN: 22 mg/dL (ref 8–23)
BUN: 21 mg/dL (ref 8–23)
BUN: 20 mg/dL (ref 8–23)
BUN: 21 mg/dL (ref 8–23)
Calcium, Ion: 1.27 mmol/L (ref 1.15–1.40)
Calcium, Ion: 1.25 mmol/L (ref 1.15–1.40)
Calcium, Ion: 1.09 mmol/L — ABNORMAL LOW (ref 1.15–1.40)
Calcium, Ion: 1.35 mmol/L (ref 1.15–1.40)
Chloride: 101 mmol/L (ref 98–111)
Chloride: 102 mmol/L (ref 98–111)
Chloride: 100 mmol/L (ref 98–111)
Chloride: 103 mmol/L (ref 98–111)
Creatinine, Ser: 0.7 mg/dL (ref 0.61–1.24)
Creatinine, Ser: 0.6 mg/dL — ABNORMAL LOW (ref 0.61–1.24)
Creatinine, Ser: 0.6 mg/dL — ABNORMAL LOW (ref 0.61–1.24)
Creatinine, Ser: 0.7 mg/dL (ref 0.61–1.24)
Glucose, Bld: 121 mg/dL — ABNORMAL HIGH (ref 70–99)
Glucose, Bld: 129 mg/dL — ABNORMAL HIGH (ref 70–99)
Glucose, Bld: 102 mg/dL — ABNORMAL HIGH (ref 70–99)
Glucose, Bld: 119 mg/dL — ABNORMAL HIGH (ref 70–99)
HCT: 37 % — ABNORMAL LOW (ref 39.0–52.0)
HCT: 34 % — ABNORMAL LOW (ref 39.0–52.0)
HCT: 26 % — ABNORMAL LOW (ref 39.0–52.0)
HCT: 26 % — ABNORMAL LOW (ref 39.0–52.0)
Hemoglobin: 12.6 g/dL — ABNORMAL LOW (ref 13.0–17.0)
Hemoglobin: 11.6 g/dL — ABNORMAL LOW (ref 13.0–17.0)
Hemoglobin: 8.8 g/dL — ABNORMAL LOW (ref 13.0–17.0)
Hemoglobin: 8.8 g/dL — ABNORMAL LOW (ref 13.0–17.0)
Potassium: 4 mmol/L (ref 3.5–5.1)
Potassium: 3.8 mmol/L (ref 3.5–5.1)
Potassium: 4.5 mmol/L (ref 3.5–5.1)
Potassium: 4.4 mmol/L (ref 3.5–5.1)
Sodium: 140 mmol/L (ref 135–145)
Sodium: 138 mmol/L (ref 135–145)
Sodium: 135 mmol/L (ref 135–145)
Sodium: 137 mmol/L (ref 135–145)
TCO2: 29 mmol/L (ref 22–32)
TCO2: 30 mmol/L (ref 22–32)
TCO2: 29 mmol/L (ref 22–32)
TCO2: 27 mmol/L (ref 22–32)

## 2021-12-24 LAB — POCT I-STAT 7, (LYTES, BLD GAS, ICA,H+H)
Acid-Base Excess: 2 mmol/L (ref 0.0–2.0)
Acid-Base Excess: 3 mmol/L — ABNORMAL HIGH (ref 0.0–2.0)
Acid-Base Excess: 4 mmol/L — ABNORMAL HIGH (ref 0.0–2.0)
Acid-Base Excess: 3 mmol/L — ABNORMAL HIGH (ref 0.0–2.0)
Acid-Base Excess: 1 mmol/L (ref 0.0–2.0)
Acid-base deficit: 3 mmol/L — ABNORMAL HIGH (ref 0.0–2.0)
Acid-base deficit: 2 mmol/L (ref 0.0–2.0)
Bicarbonate: 29.1 mmol/L — ABNORMAL HIGH (ref 20.0–28.0)
Bicarbonate: 28.3 mmol/L — ABNORMAL HIGH (ref 20.0–28.0)
Bicarbonate: 28.6 mmol/L — ABNORMAL HIGH (ref 20.0–28.0)
Bicarbonate: 27.5 mmol/L (ref 20.0–28.0)
Bicarbonate: 26.4 mmol/L (ref 20.0–28.0)
Bicarbonate: 24.1 mmol/L (ref 20.0–28.0)
Bicarbonate: 24.9 mmol/L (ref 20.0–28.0)
Calcium, Ion: 1.31 mmol/L (ref 1.15–1.40)
Calcium, Ion: 0.97 mmol/L — ABNORMAL LOW (ref 1.15–1.40)
Calcium, Ion: 1.1 mmol/L — ABNORMAL LOW (ref 1.15–1.40)
Calcium, Ion: 1.34 mmol/L (ref 1.15–1.40)
Calcium, Ion: 1.3 mmol/L (ref 1.15–1.40)
Calcium, Ion: 1.25 mmol/L (ref 1.15–1.40)
Calcium, Ion: 1.25 mmol/L (ref 1.15–1.40)
HCT: 38 % — ABNORMAL LOW (ref 39.0–52.0)
HCT: 26 % — ABNORMAL LOW (ref 39.0–52.0)
HCT: 27 % — ABNORMAL LOW (ref 39.0–52.0)
HCT: 28 % — ABNORMAL LOW (ref 39.0–52.0)
HCT: 31 % — ABNORMAL LOW (ref 39.0–52.0)
HCT: 30 % — ABNORMAL LOW (ref 39.0–52.0)
HCT: 30 % — ABNORMAL LOW (ref 39.0–52.0)
Hemoglobin: 12.9 g/dL — ABNORMAL LOW (ref 13.0–17.0)
Hemoglobin: 8.8 g/dL — ABNORMAL LOW (ref 13.0–17.0)
Hemoglobin: 9.2 g/dL — ABNORMAL LOW (ref 13.0–17.0)
Hemoglobin: 9.5 g/dL — ABNORMAL LOW (ref 13.0–17.0)
Hemoglobin: 10.5 g/dL — ABNORMAL LOW (ref 13.0–17.0)
Hemoglobin: 10.2 g/dL — ABNORMAL LOW (ref 13.0–17.0)
Hemoglobin: 10.2 g/dL — ABNORMAL LOW (ref 13.0–17.0)
O2 Saturation: 100 %
O2 Saturation: 100 %
O2 Saturation: 100 %
O2 Saturation: 100 %
O2 Saturation: 100 %
O2 Saturation: 99 %
O2 Saturation: 100 %
Patient temperature: 36.7
Patient temperature: 37.6
Patient temperature: 37.6
Potassium: 4.1 mmol/L (ref 3.5–5.1)
Potassium: 4.2 mmol/L (ref 3.5–5.1)
Potassium: 4.5 mmol/L (ref 3.5–5.1)
Potassium: 4.5 mmol/L (ref 3.5–5.1)
Potassium: 3.8 mmol/L (ref 3.5–5.1)
Potassium: 4.1 mmol/L (ref 3.5–5.1)
Potassium: 3.8 mmol/L (ref 3.5–5.1)
Sodium: 139 mmol/L (ref 135–145)
Sodium: 140 mmol/L (ref 135–145)
Sodium: 135 mmol/L (ref 135–145)
Sodium: 137 mmol/L (ref 135–145)
Sodium: 140 mmol/L (ref 135–145)
Sodium: 142 mmol/L (ref 135–145)
Sodium: 142 mmol/L (ref 135–145)
TCO2: 31 mmol/L (ref 22–32)
TCO2: 30 mmol/L (ref 22–32)
TCO2: 30 mmol/L (ref 22–32)
TCO2: 29 mmol/L (ref 22–32)
TCO2: 28 mmol/L (ref 22–32)
TCO2: 26 mmol/L (ref 22–32)
TCO2: 27 mmol/L (ref 22–32)
pCO2 arterial: 53.8 mmHg — ABNORMAL HIGH (ref 32.0–48.0)
pCO2 arterial: 43.8 mmHg (ref 32.0–48.0)
pCO2 arterial: 42 mmHg (ref 32.0–48.0)
pCO2 arterial: 40.5 mmHg (ref 32.0–48.0)
pCO2 arterial: 42.5 mmHg (ref 32.0–48.0)
pCO2 arterial: 51.2 mmHg — ABNORMAL HIGH (ref 32.0–48.0)
pCO2 arterial: 54.8 mmHg — ABNORMAL HIGH (ref 32.0–48.0)
pH, Arterial: 7.34 — ABNORMAL LOW (ref 7.350–7.450)
pH, Arterial: 7.419 (ref 7.350–7.450)
pH, Arterial: 7.44 (ref 7.350–7.450)
pH, Arterial: 7.44 (ref 7.350–7.450)
pH, Arterial: 7.4 (ref 7.350–7.450)
pH, Arterial: 7.284 — ABNORMAL LOW (ref 7.350–7.450)
pH, Arterial: 7.269 — ABNORMAL LOW (ref 7.350–7.450)
pO2, Arterial: 430 mmHg — ABNORMAL HIGH (ref 83.0–108.0)
pO2, Arterial: 433 mmHg — ABNORMAL HIGH (ref 83.0–108.0)
pO2, Arterial: 402 mmHg — ABNORMAL HIGH (ref 83.0–108.0)
pO2, Arterial: 261 mmHg — ABNORMAL HIGH (ref 83.0–108.0)
pO2, Arterial: 195 mmHg — ABNORMAL HIGH (ref 83.0–108.0)
pO2, Arterial: 167 mmHg — ABNORMAL HIGH (ref 83.0–108.0)
pO2, Arterial: 206 mmHg — ABNORMAL HIGH (ref 83.0–108.0)

## 2021-12-24 LAB — GLUCOSE, CAPILLARY
Glucose-Capillary: 105 mg/dL — ABNORMAL HIGH (ref 70–99)
Glucose-Capillary: 101 mg/dL — ABNORMAL HIGH (ref 70–99)
Glucose-Capillary: 103 mg/dL — ABNORMAL HIGH (ref 70–99)
Glucose-Capillary: 111 mg/dL — ABNORMAL HIGH (ref 70–99)
Glucose-Capillary: 125 mg/dL — ABNORMAL HIGH (ref 70–99)
Glucose-Capillary: 107 mg/dL — ABNORMAL HIGH (ref 70–99)
Glucose-Capillary: 119 mg/dL — ABNORMAL HIGH (ref 70–99)
Glucose-Capillary: 123 mg/dL — ABNORMAL HIGH (ref 70–99)
Glucose-Capillary: 133 mg/dL — ABNORMAL HIGH (ref 70–99)

## 2021-12-24 LAB — BASIC METABOLIC PANEL
Anion gap: 8 (ref 5–15)
Anion gap: 5 (ref 5–15)
BUN: 22 mg/dL (ref 8–23)
BUN: 18 mg/dL (ref 8–23)
CO2: 27 mmol/L (ref 22–32)
CO2: 24 mmol/L (ref 22–32)
Calcium: 9.6 mg/dL (ref 8.9–10.3)
Calcium: 8.1 mg/dL — ABNORMAL LOW (ref 8.9–10.3)
Chloride: 103 mmol/L (ref 98–111)
Chloride: 112 mmol/L — ABNORMAL HIGH (ref 98–111)
Creatinine, Ser: 0.99 mg/dL (ref 0.61–1.24)
Creatinine, Ser: 0.88 mg/dL (ref 0.61–1.24)
GFR, Estimated: >60 mL/min (ref >60)
GFR, Estimated: >60 mL/min (ref >60)
Glucose, Bld: 138 mg/dL — ABNORMAL HIGH (ref 70–99)
Glucose, Bld: 121 mg/dL — ABNORMAL HIGH (ref 70–99)
Potassium: 3.8 mmol/L (ref 3.5–5.1)
Potassium: 3.9 mmol/L (ref 3.5–5.1)
Sodium: 138 mmol/L (ref 135–145)
Sodium: 141 mmol/L (ref 135–145)

## 2021-12-24 LAB — PROTIME-INR
INR: 1.3 — ABNORMAL HIGH (ref 0.8–1.2)
Prothrombin Time: 16 seconds — ABNORMAL HIGH (ref 11.4–15.2)

## 2021-12-24 LAB — CBC
HCT: 35.4 % — ABNORMAL LOW (ref 39.0–52.0)
HCT: 31.5 % — ABNORMAL LOW (ref 39.0–52.0)
HCT: 33.1 % — ABNORMAL LOW (ref 39.0–52.0)
Hemoglobin: 12 g/dL — ABNORMAL LOW (ref 13.0–17.0)
Hemoglobin: 11 g/dL — ABNORMAL LOW (ref 13.0–17.0)
Hemoglobin: 11.3 g/dL — ABNORMAL LOW (ref 13.0–17.0)
MCH: 29.3 pg (ref 26.0–34.0)
MCH: 30.4 pg (ref 26.0–34.0)
MCH: 29.8 pg (ref 26.0–34.0)
MCHC: 33.9 g/dL (ref 30.0–36.0)
MCHC: 34.9 g/dL (ref 30.0–36.0)
MCHC: 34.1 g/dL (ref 30.0–36.0)
MCV: 86.6 fL (ref 80.0–100.0)
MCV: 87 fL (ref 80.0–100.0)
MCV: 87.3 fL (ref 80.0–100.0)
Platelets: 123 10*3/uL — ABNORMAL LOW (ref 150–400)
Platelets: 100 10*3/uL — ABNORMAL LOW (ref 150–400)
Platelets: 141 10*3/uL — ABNORMAL LOW (ref 150–400)
RBC: 4.09 MIL/uL — ABNORMAL LOW (ref 4.22–5.81)
RBC: 3.62 MIL/uL — ABNORMAL LOW (ref 4.22–5.81)
RBC: 3.79 MIL/uL — ABNORMAL LOW (ref 4.22–5.81)
RDW: 12.7 % (ref 11.5–15.5)
RDW: 12.8 % (ref 11.5–15.5)
RDW: 13 % (ref 11.5–15.5)
WBC: 4.5 10*3/uL (ref 4.0–10.5)
WBC: 7.5 10*3/uL (ref 4.0–10.5)
WBC: 13.2 10*3/uL — ABNORMAL HIGH (ref 4.0–10.5)
nRBC: 0 % (ref 0.0–0.2)
nRBC: 0 % (ref 0.0–0.2)
nRBC: 0 % (ref 0.0–0.2)

## 2021-12-24 LAB — MAGNESIUM: Magnesium: 2.5 mg/dL — ABNORMAL HIGH (ref 1.7–2.4)

## 2021-12-24 LAB — POCT I-STAT EG7
Acid-Base Excess: 3 mmol/L — ABNORMAL HIGH (ref 0.0–2.0)
Bicarbonate: 28 mmol/L (ref 20.0–28.0)
Calcium, Ion: 1.11 mmol/L — ABNORMAL LOW (ref 1.15–1.40)
HCT: 28 % — ABNORMAL LOW (ref 39.0–52.0)
Hemoglobin: 9.5 g/dL — ABNORMAL LOW (ref 13.0–17.0)
O2 Saturation: 80 %
Potassium: 5.1 mmol/L (ref 3.5–5.1)
Sodium: 139 mmol/L (ref 135–145)
TCO2: 29 mmol/L (ref 22–32)
pCO2, Ven: 44.9 mmHg (ref 44.0–60.0)
pH, Ven: 7.403 (ref 7.250–7.430)
pO2, Ven: 45 mmHg (ref 32.0–45.0)

## 2021-12-24 LAB — HEPARIN LEVEL (UNFRACTIONATED): Heparin Unfractionated: 0.47 IU/mL (ref 0.30–0.70)

## 2021-12-24 LAB — ECHO INTRAOPERATIVE TEE
AV Mean grad: 8 mmHg
AV Peak grad: 17 mmHg
Ao pk vel: 2.06 m/s
Height: 71 in
Weight: 2960 oz

## 2021-12-24 LAB — PLATELET COUNT: Platelets: 119 10*3/uL — ABNORMAL LOW (ref 150–400)

## 2021-12-24 LAB — HEMOGLOBIN AND HEMATOCRIT, BLOOD
HCT: 26.6 % — ABNORMAL LOW (ref 39.0–52.0)
Hemoglobin: 9.3 g/dL — ABNORMAL LOW (ref 13.0–17.0)

## 2021-12-24 LAB — APTT: aPTT: 32 seconds (ref 24–36)

## 2021-12-24 LAB — SARS CORONAVIRUS 2 BY RT PCR (HOSPITAL ORDER, PERFORMED IN ~~LOC~~ HOSPITAL LAB): SARS Coronavirus 2: NEGATIVE

## 2021-12-24 SURGERY — CORONARY ARTERY BYPASS GRAFTING (CABG)
Anesthesia: General | Site: Leg Upper | Laterality: Right

## 2021-12-24 MED ORDER — DEXMEDETOMIDINE HCL IN NACL 400 MCG/100ML IV SOLN: 0.0000 ug/kg/h | INTRAVENOUS | Status: DC

## 2021-12-24 MED ORDER — SODIUM CHLORIDE 0.9% FLUSH
3.0000 mL | Freq: Two times a day (BID) | INTRAVENOUS | Status: DC
  Administered 2021-12-25 – 2021-12-27 (×4): 3 mL via INTRAVENOUS

## 2021-12-24 MED ORDER — ORAL CARE MOUTH RINSE
15.0000 mL | OROMUCOSAL | Status: DC
  Administered 2021-12-24 (×2): 15 mL via OROMUCOSAL

## 2021-12-24 MED ORDER — ORAL CARE MOUTH RINSE
15.0000 mL | Freq: Two times a day (BID) | OROMUCOSAL | Status: DC
  Administered 2021-12-24 – 2021-12-25 (×2): 15 mL via OROMUCOSAL

## 2021-12-24 MED ORDER — CEFAZOLIN SODIUM-DEXTROSE 2-4 GM/100ML-% IV SOLN
2.0000 g | Freq: Three times a day (TID) | INTRAVENOUS | Status: AC
  Administered 2021-12-24 – 2021-12-26 (×6): 2 g via INTRAVENOUS
  Filled 2021-12-24 (×6): qty 100

## 2021-12-24 MED ORDER — CHLORHEXIDINE GLUCONATE 0.12% ORAL RINSE (MEDLINE KIT)
15.0000 mL | Freq: Two times a day (BID) | OROMUCOSAL | Status: DC
  Administered 2021-12-24: 20:00:00 15 mL via OROMUCOSAL

## 2021-12-24 MED ORDER — MAGNESIUM SULFATE 4 GM/100ML IV SOLN
4.0000 g | Freq: Once | INTRAVENOUS | Status: AC
  Administered 2021-12-24: 16:00:00 4 g via INTRAVENOUS
  Filled 2021-12-24: qty 100

## 2021-12-24 MED ORDER — ACETAMINOPHEN 500 MG PO TABS
1000.0000 mg | ORAL_TABLET | Freq: Four times a day (QID) | ORAL | Status: DC
  Administered 2021-12-25 – 2021-12-28 (×9): 1000 mg via ORAL
  Filled 2021-12-24 (×10): qty 2

## 2021-12-24 MED ORDER — BISACODYL 10 MG RE SUPP: 10.0000 mg | Freq: Every day | RECTAL | Status: DC

## 2021-12-24 MED ORDER — HEPARIN SODIUM (PORCINE) 1000 UNIT/ML IJ SOLN
INTRAMUSCULAR | Status: DC | PRN
  Administered 2021-12-24: 30000 [IU] via INTRAVENOUS

## 2021-12-24 MED ORDER — ROCURONIUM BROMIDE 10 MG/ML (PF) SYRINGE
PREFILLED_SYRINGE | INTRAVENOUS | Status: AC
  Filled 2021-12-24: qty 10

## 2021-12-24 MED ORDER — SODIUM CHLORIDE 0.9 % IV SOLN: 250.0000 mL | INTRAVENOUS | Status: DC

## 2021-12-24 MED ORDER — MIDAZOLAM HCL (PF) 10 MG/2ML IJ SOLN
INTRAMUSCULAR | Status: AC
  Filled 2021-12-24: qty 2

## 2021-12-24 MED ORDER — ALUM & MAG HYDROXIDE-SIMETH 200-200-20 MG/5ML PO SUSP
30.0000 mL | ORAL | Status: DC | PRN
  Administered 2021-12-24: 01:00:00 30 mL via ORAL
  Filled 2021-12-24: qty 30

## 2021-12-24 MED ORDER — METOPROLOL TARTRATE 12.5 MG HALF TABLET
12.5000 mg | ORAL_TABLET | Freq: Two times a day (BID) | ORAL | Status: DC
  Administered 2021-12-25 – 2021-12-27 (×5): 12.5 mg via ORAL
  Filled 2021-12-24 (×7): qty 1

## 2021-12-24 MED ORDER — SODIUM CHLORIDE 0.9% FLUSH: 10.0000 mL | INTRAVENOUS | Status: DC | PRN

## 2021-12-24 MED ORDER — ROCURONIUM BROMIDE 10 MG/ML (PF) SYRINGE
PREFILLED_SYRINGE | INTRAVENOUS | Status: DC | PRN
  Administered 2021-12-24: 20 mg via INTRAVENOUS
  Administered 2021-12-24: 100 mg via INTRAVENOUS
  Administered 2021-12-24: 50 mg via INTRAVENOUS

## 2021-12-24 MED ORDER — LACTATED RINGERS IV SOLN: 500.0000 mL | Freq: Once | INTRAVENOUS | Status: DC | PRN

## 2021-12-24 MED ORDER — NICARDIPINE HCL IN NACL 20-0.86 MG/200ML-% IV SOLN: 0.0000 mg/h | INTRAVENOUS | Status: DC

## 2021-12-24 MED ORDER — PHENYLEPHRINE HCL-NACL 20-0.9 MG/250ML-% IV SOLN
0.0000 ug/min | INTRAVENOUS | Status: DC
  Administered 2021-12-24: 60 ug/min via INTRAVENOUS
  Administered 2021-12-25: 06:00:00 70 ug/min via INTRAVENOUS
  Filled 2021-12-24 (×2): qty 250

## 2021-12-24 MED ORDER — FENTANYL CITRATE (PF) 250 MCG/5ML IJ SOLN
INTRAMUSCULAR | Status: AC
  Filled 2021-12-24: qty 5

## 2021-12-24 MED ORDER — ALBUMIN HUMAN 5 % IV SOLN
250.0000 mL | INTRAVENOUS | Status: AC | PRN
  Administered 2021-12-24 (×2): 12.5 g via INTRAVENOUS

## 2021-12-24 MED ORDER — ACETAMINOPHEN 160 MG/5ML PO SOLN: 650.0000 mg | Freq: Once | ORAL | Status: AC

## 2021-12-24 MED ORDER — METOPROLOL TARTRATE 5 MG/5ML IV SOLN: 2.5000 mg | INTRAVENOUS | Status: DC | PRN

## 2021-12-24 MED ORDER — SODIUM CHLORIDE 0.9% FLUSH: 3.0000 mL | INTRAVENOUS | Status: DC | PRN

## 2021-12-24 MED ORDER — PROPOFOL 10 MG/ML IV BOLUS
INTRAVENOUS | Status: AC
  Filled 2021-12-24: qty 20

## 2021-12-24 MED ORDER — LACTATED RINGERS IV SOLN: INTRAVENOUS | Status: DC

## 2021-12-24 MED ORDER — ONDANSETRON HCL 4 MG/2ML IJ SOLN
4.0000 mg | Freq: Four times a day (QID) | INTRAMUSCULAR | Status: DC | PRN
  Administered 2021-12-24 – 2021-12-26 (×7): 4 mg via INTRAVENOUS
  Filled 2021-12-24 (×7): qty 2

## 2021-12-24 MED ORDER — POTASSIUM CHLORIDE 10 MEQ/50ML IV SOLN
10.0000 meq | INTRAVENOUS | Status: AC
  Administered 2021-12-24 (×3): 10 meq via INTRAVENOUS

## 2021-12-24 MED ORDER — PROTAMINE SULFATE 10 MG/ML IV SOLN
INTRAVENOUS | Status: DC | PRN
  Administered 2021-12-24: 10 mg via INTRAVENOUS
  Administered 2021-12-24: 290 mg via INTRAVENOUS

## 2021-12-24 MED ORDER — 0.9 % SODIUM CHLORIDE (POUR BTL) OPTIME
TOPICAL | Status: DC | PRN
  Administered 2021-12-24: 11:00:00 4000 mL

## 2021-12-24 MED ORDER — HEPARIN SODIUM (PORCINE) 1000 UNIT/ML IJ SOLN
INTRAMUSCULAR | Status: AC
  Filled 2021-12-24: qty 1

## 2021-12-24 MED ORDER — FAMOTIDINE IN NACL 20-0.9 MG/50ML-% IV SOLN
20.0000 mg | Freq: Two times a day (BID) | INTRAVENOUS | Status: AC
  Administered 2021-12-24 (×2): 20 mg via INTRAVENOUS
  Filled 2021-12-24 (×2): qty 50

## 2021-12-24 MED ORDER — CHLORHEXIDINE GLUCONATE 0.12 % MT SOLN: 15.0000 mL | Freq: Once | OROMUCOSAL | Status: DC

## 2021-12-24 MED ORDER — SODIUM CHLORIDE (PF) 0.9 % IJ SOLN
OROMUCOSAL | Status: DC | PRN
  Administered 2021-12-24: 11:00:00 8 mL via TOPICAL

## 2021-12-24 MED ORDER — ACETAMINOPHEN 160 MG/5ML PO SOLN: 1000.0000 mg | Freq: Four times a day (QID) | ORAL | Status: DC

## 2021-12-24 MED ORDER — DOBUTAMINE IN D5W 4-5 MG/ML-% IV SOLN: 0.0000 ug/kg/min | INTRAVENOUS | Status: DC

## 2021-12-24 MED ORDER — INSULIN REGULAR(HUMAN) IN NACL 100-0.9 UT/100ML-% IV SOLN: INTRAVENOUS | Status: DC

## 2021-12-24 MED ORDER — ACETAMINOPHEN 650 MG RE SUPP
650.0000 mg | Freq: Once | RECTAL | Status: AC
  Administered 2021-12-24: 16:00:00 650 mg via RECTAL

## 2021-12-24 MED ORDER — LIDOCAINE 2% (20 MG/ML) 5 ML SYRINGE
INTRAMUSCULAR | Status: AC
  Filled 2021-12-24: qty 5

## 2021-12-24 MED ORDER — DOCUSATE SODIUM 100 MG PO CAPS
200.0000 mg | ORAL_CAPSULE | Freq: Every day | ORAL | Status: DC
  Administered 2021-12-25 – 2021-12-28 (×4): 200 mg via ORAL
  Filled 2021-12-24 (×4): qty 2

## 2021-12-24 MED ORDER — ASPIRIN 81 MG PO CHEW
324.0000 mg | CHEWABLE_TABLET | Freq: Every day | ORAL | Status: DC
  Filled 2021-12-24: qty 4

## 2021-12-24 MED ORDER — MIDAZOLAM HCL (PF) 5 MG/ML IJ SOLN
INTRAMUSCULAR | Status: DC | PRN
  Administered 2021-12-24 (×2): 2 mg via INTRAVENOUS
  Administered 2021-12-24 (×2): 3 mg via INTRAVENOUS

## 2021-12-24 MED ORDER — LACTATED RINGERS IV SOLN: INTRAVENOUS | Status: DC | PRN

## 2021-12-24 MED ORDER — VANCOMYCIN HCL IN DEXTROSE 1-5 GM/200ML-% IV SOLN
1000.0000 mg | Freq: Once | INTRAVENOUS | Status: AC
  Administered 2021-12-24: 22:00:00 1000 mg via INTRAVENOUS
  Filled 2021-12-24: qty 200

## 2021-12-24 MED ORDER — DEXTROSE 50 % IV SOLN: 0.0000 mL | INTRAVENOUS | Status: DC | PRN

## 2021-12-24 MED ORDER — MIDAZOLAM HCL 2 MG/2ML IJ SOLN: 2.0000 mg | INTRAMUSCULAR | Status: DC | PRN

## 2021-12-24 MED ORDER — ASPIRIN EC 325 MG PO TBEC
325.0000 mg | DELAYED_RELEASE_TABLET | Freq: Every day | ORAL | Status: DC
  Administered 2021-12-25 – 2021-12-26 (×2): 325 mg via ORAL
  Filled 2021-12-24 (×2): qty 1

## 2021-12-24 MED ORDER — FENTANYL CITRATE (PF) 250 MCG/5ML IJ SOLN
INTRAMUSCULAR | Status: DC | PRN
  Administered 2021-12-24: 150 ug via INTRAVENOUS
  Administered 2021-12-24: 250 ug via INTRAVENOUS
  Administered 2021-12-24 (×2): 50 ug via INTRAVENOUS
  Administered 2021-12-24: 150 ug via INTRAVENOUS
  Administered 2021-12-24 (×2): 100 ug via INTRAVENOUS
  Administered 2021-12-24: 250 ug via INTRAVENOUS

## 2021-12-24 MED ORDER — SODIUM CHLORIDE 0.45 % IV SOLN: INTRAVENOUS | Status: DC | PRN

## 2021-12-24 MED ORDER — TRAMADOL HCL 50 MG PO TABS
50.0000 mg | ORAL_TABLET | ORAL | Status: DC | PRN
  Administered 2021-12-25: 11:00:00 100 mg via ORAL
  Filled 2021-12-24: qty 2

## 2021-12-24 MED ORDER — SODIUM CHLORIDE 0.9% FLUSH
10.0000 mL | Freq: Two times a day (BID) | INTRAVENOUS | Status: DC
  Administered 2021-12-24 – 2021-12-26 (×4): 10 mL

## 2021-12-24 MED ORDER — BISACODYL 5 MG PO TBEC
10.0000 mg | DELAYED_RELEASE_TABLET | Freq: Every day | ORAL | Status: DC
  Administered 2021-12-25 – 2021-12-27 (×3): 10 mg via ORAL
  Filled 2021-12-24 (×3): qty 2

## 2021-12-24 MED ORDER — CHLORHEXIDINE GLUCONATE 0.12 % MT SOLN
OROMUCOSAL | Status: AC
  Filled 2021-12-24: qty 15

## 2021-12-24 MED ORDER — SODIUM CHLORIDE 0.9 % IV SOLN: INTRAVENOUS | Status: DC

## 2021-12-24 MED ORDER — MORPHINE SULFATE (PF) 2 MG/ML IV SOLN
1.0000 mg | INTRAVENOUS | Status: DC | PRN
  Administered 2021-12-24: 1 mg via INTRAVENOUS
  Administered 2021-12-25: 21:00:00 4 mg via INTRAVENOUS
  Administered 2021-12-25: 02:00:00 2 mg via INTRAVENOUS
  Administered 2021-12-25: 13:00:00 4 mg via INTRAVENOUS
  Administered 2021-12-25: 1 mg via INTRAVENOUS
  Administered 2021-12-25 (×2): 4 mg via INTRAVENOUS
  Administered 2021-12-25: 02:00:00 2 mg via INTRAVENOUS
  Administered 2021-12-25 (×2): 4 mg via INTRAVENOUS
  Administered 2021-12-26: 12:00:00 2 mg via INTRAVENOUS
  Filled 2021-12-24 (×2): qty 2
  Filled 2021-12-24 (×2): qty 1
  Filled 2021-12-24 (×2): qty 2
  Filled 2021-12-24: qty 1
  Filled 2021-12-24 (×2): qty 2
  Filled 2021-12-24: qty 1

## 2021-12-24 MED ORDER — NITROGLYCERIN IN D5W 200-5 MCG/ML-% IV SOLN: 0.0000 ug/min | INTRAVENOUS | Status: DC

## 2021-12-24 MED ORDER — CHLORHEXIDINE GLUCONATE 0.12 % MT SOLN
15.0000 mL | OROMUCOSAL | Status: AC
  Administered 2021-12-24: 17:00:00 15 mL via OROMUCOSAL

## 2021-12-24 MED ORDER — PLASMA-LYTE A IV SOLN
INTRAVENOUS | Status: DC | PRN
  Administered 2021-12-24: 11:00:00 1000 mL via INTRAVASCULAR

## 2021-12-24 MED ORDER — PANTOPRAZOLE SODIUM 40 MG PO TBEC
40.0000 mg | DELAYED_RELEASE_TABLET | Freq: Every day | ORAL | Status: DC
  Administered 2021-12-26 – 2021-12-28 (×3): 40 mg via ORAL
  Filled 2021-12-24 (×3): qty 1

## 2021-12-24 MED ORDER — OXYCODONE HCL 5 MG PO TABS
5.0000 mg | ORAL_TABLET | ORAL | Status: DC | PRN
  Administered 2021-12-24 – 2021-12-28 (×14): 10 mg via ORAL
  Filled 2021-12-24 (×15): qty 2

## 2021-12-24 MED ORDER — ORAL CARE MOUTH RINSE: 15.0000 mL | Freq: Once | OROMUCOSAL | Status: DC

## 2021-12-24 MED ORDER — METOPROLOL TARTRATE 25 MG/10 ML ORAL SUSPENSION: 12.5000 mg | Freq: Two times a day (BID) | ORAL | Status: DC

## 2021-12-24 MED ORDER — NOREPINEPHRINE 4 MG/250ML-% IV SOLN: 0.0000 ug/min | INTRAVENOUS | Status: DC

## 2021-12-24 MED ORDER — PROPOFOL 10 MG/ML IV BOLUS
INTRAVENOUS | Status: DC | PRN
  Administered 2021-12-24: 40 mg via INTRAVENOUS

## 2021-12-24 MED ORDER — PHENYLEPHRINE 40 MCG/ML (10ML) SYRINGE FOR IV PUSH (FOR BLOOD PRESSURE SUPPORT)
PREFILLED_SYRINGE | INTRAVENOUS | Status: DC | PRN
  Administered 2021-12-24: 80 ug via INTRAVENOUS
  Administered 2021-12-24 (×2): 40 ug via INTRAVENOUS

## 2021-12-24 MED ORDER — CHLORHEXIDINE GLUCONATE CLOTH 2 % EX PADS
6.0000 | MEDICATED_PAD | Freq: Every day | CUTANEOUS | Status: DC
  Administered 2021-12-24 – 2021-12-26 (×3): 6 via TOPICAL

## 2021-12-24 SURGICAL SUPPLY — 76 items
ADH SKN CLS APL DERMABOND .7 (GAUZE/BANDAGES/DRESSINGS) ×3
BAG DECANTER FOR FLEXI CONT (MISCELLANEOUS) ×4 IMPLANT
BLADE CLIPPER SURG (BLADE) ×4 IMPLANT
BLADE STERNUM SYSTEM 6 (BLADE) ×4 IMPLANT
BNDG ELASTIC 4X5.8 VLCR STR LF (GAUZE/BANDAGES/DRESSINGS) ×4 IMPLANT
BNDG ELASTIC 6X5.8 VLCR STR LF (GAUZE/BANDAGES/DRESSINGS) ×4 IMPLANT
BNDG GAUZE ELAST 4 BULKY (GAUZE/BANDAGES/DRESSINGS) ×4 IMPLANT
CABLE SURGICAL S-101-97-12 (CABLE) ×4 IMPLANT
CANISTER SUCT 3000ML PPV (MISCELLANEOUS) ×4 IMPLANT
CANNULA MC2 2 STG 29/37 NON-V (CANNULA) ×3 IMPLANT
CANNULA MC2 TWO STAGE (CANNULA) ×4
CANNULA NON VENT 22FR 12 (CANNULA) ×1 IMPLANT
CATH ROBINSON RED A/P 18FR (CATHETERS) ×8 IMPLANT
CLIP VESOCCLUDE MED 24/CT (CLIP) IMPLANT
CLIP VESOCCLUDE SM WIDE 24/CT (CLIP) IMPLANT
CONNECTOR BLAKE 2:1 CARIO BLK (MISCELLANEOUS) ×4 IMPLANT
CONTAINER PROTECT SURGISLUSH (MISCELLANEOUS) ×8 IMPLANT
DERMABOND ADVANCED (GAUZE/BANDAGES/DRESSINGS) ×1
DERMABOND ADVANCED .7 DNX12 (GAUZE/BANDAGES/DRESSINGS) IMPLANT
DRAIN CHANNEL 19F RND (DRAIN) ×11 IMPLANT
DRAPE CARDIOVASCULAR INCISE (DRAPES) ×4
DRAPE SRG 135X102X78XABS (DRAPES) ×3 IMPLANT
DRAPE WARM FLUID 44X44 (DRAPES) ×4 IMPLANT
DRSG AQUACEL AG ADV 3.5X10 (GAUZE/BANDAGES/DRESSINGS) ×4 IMPLANT
ELECT BLADE 4.0 EZ CLEAN MEGAD (MISCELLANEOUS) ×4
ELECT REM PT RETURN 9FT ADLT (ELECTROSURGICAL) ×8
ELECTRODE BLDE 4.0 EZ CLN MEGD (MISCELLANEOUS) ×3 IMPLANT
ELECTRODE REM PT RTRN 9FT ADLT (ELECTROSURGICAL) ×6 IMPLANT
FELT TEFLON 1X6 (MISCELLANEOUS) ×7 IMPLANT
GAUZE SPONGE 4X4 12PLY STRL (GAUZE/BANDAGES/DRESSINGS) ×7 IMPLANT
GAUZE SPONGE 4X4 12PLY STRL LF (GAUZE/BANDAGES/DRESSINGS) ×2 IMPLANT
GLOVE SURG ENC TEXT LTX SZ7.5 (GLOVE) ×8 IMPLANT
GLOVE SURG MICRO LTX SZ6 (GLOVE) ×6 IMPLANT
GOWN STRL REUS W/ TWL LRG LVL3 (GOWN DISPOSABLE) ×12 IMPLANT
GOWN STRL REUS W/ TWL XL LVL3 (GOWN DISPOSABLE) ×6 IMPLANT
GOWN STRL REUS W/TWL LRG LVL3 (GOWN DISPOSABLE) ×16
GOWN STRL REUS W/TWL XL LVL3 (GOWN DISPOSABLE) ×8
HEMOSTAT POWDER SURGIFOAM 1G (HEMOSTASIS) ×11 IMPLANT
INSERT SUTURE HOLDER (MISCELLANEOUS) ×4 IMPLANT
KIT BASIN OR (CUSTOM PROCEDURE TRAY) ×4 IMPLANT
KIT TURNOVER KIT B (KITS) ×4 IMPLANT
KIT VASOVIEW HEMOPRO 2 VH 4000 (KITS) ×4 IMPLANT
MARKER GRAFT CORONARY BYPASS (MISCELLANEOUS) ×12 IMPLANT
NS IRRIG 1000ML POUR BTL (IV SOLUTION) ×20 IMPLANT
PACK ACCESSORY CANNULA KIT (KITS) ×4 IMPLANT
PACK E OPEN HEART (SUTURE) ×4 IMPLANT
PACK OPEN HEART (CUSTOM PROCEDURE TRAY) ×4 IMPLANT
PAD ARMBOARD 7.5X6 YLW CONV (MISCELLANEOUS) ×8 IMPLANT
PAD ELECT DEFIB RADIOL ZOLL (MISCELLANEOUS) ×4 IMPLANT
PENCIL BUTTON HOLSTER BLD 10FT (ELECTRODE) ×4 IMPLANT
POSITIONER HEAD DONUT 9IN (MISCELLANEOUS) ×4 IMPLANT
PUNCH AORTIC ROTATE 4.0MM (MISCELLANEOUS) ×4 IMPLANT
SET MPS 3-ND DEL (MISCELLANEOUS) ×1 IMPLANT
SUPPORT HEART JANKE-BARRON (MISCELLANEOUS) ×4 IMPLANT
SUT BONE WAX W31G (SUTURE) ×4 IMPLANT
SUT ETHIBOND X763 2 0 SH 1 (SUTURE) ×8 IMPLANT
SUT MNCRL AB 3-0 PS2 18 (SUTURE) ×8 IMPLANT
SUT PDS AB 1 CTX 36 (SUTURE) ×8 IMPLANT
SUT PROLENE 4 0 RB 1 (SUTURE) ×4
SUT PROLENE 4 0 SH DA (SUTURE) ×5 IMPLANT
SUT PROLENE 4-0 RB1 .5 CRCL 36 (SUTURE) IMPLANT
SUT PROLENE 5 0 C 1 36 (SUTURE) ×12 IMPLANT
SUT PROLENE 7 0 BV1 MDA (SUTURE) ×4 IMPLANT
SUT STEEL 6MS V (SUTURE) ×8 IMPLANT
SUT VIC AB 2-0 CT1 27 (SUTURE) ×4
SUT VIC AB 2-0 CT1 TAPERPNT 27 (SUTURE) IMPLANT
SUT VIC AB 3-0 X1 27 (SUTURE) ×1 IMPLANT
SYSTEM SAHARA CHEST DRAIN ATS (WOUND CARE) ×4 IMPLANT
TAPE CLOTH SURG 4X10 WHT LF (GAUZE/BANDAGES/DRESSINGS) ×1 IMPLANT
TAPE PAPER 2X10 WHT MICROPORE (GAUZE/BANDAGES/DRESSINGS) ×1 IMPLANT
TOWEL GREEN STERILE (TOWEL DISPOSABLE) ×4 IMPLANT
TOWEL GREEN STERILE FF (TOWEL DISPOSABLE) ×4 IMPLANT
TRAY FOLEY SLVR 16FR TEMP STAT (SET/KITS/TRAYS/PACK) ×4 IMPLANT
TUBING LAP HI FLOW INSUFFLATIO (TUBING) ×4 IMPLANT
UNDERPAD 30X36 HEAVY ABSORB (UNDERPADS AND DIAPERS) ×4 IMPLANT
WATER STERILE IRR 1000ML POUR (IV SOLUTION) ×8 IMPLANT

## 2021-12-24 NOTE — Transfer of Care (Signed)
Immediate Anesthesia Transfer of Care Note  Patient: Ronald Petersen  Procedure(s) Performed: CORONARY ARTERY BYPASS GRAFTING (CABG), ON PUMP, TIMES TWO, USING LEFT INTERNAL MAMMARY ARTERY AND RIGHT ENDOSCOPICALLY HARVESTED GREATER SAPHENOUS VEIN (Chest) TRANSESOPHAGEAL ECHOCARDIOGRAM (TEE) (Esophagus) ENDOVEIN HARVEST OF GREATER SAPHENOUS VEIN (Right: Leg Upper)  Patient Location: ICU  Anesthesia Type:General  Level of Consciousness: sedated and Patient remains intubated per anesthesia plan  Airway & Oxygen Therapy: Patient remains intubated per anesthesia plan and Patient placed on Ventilator (see vital sign flow sheet for setting)  Post-op Assessment: Report given to RN and Post -op Vital signs reviewed and stable  Post vital signs: Reviewed and stable  Last Vitals:  Vitals Value Taken Time  BP 114/70 12/24/21 1540  Temp 36.7 C 12/24/21 1545  Pulse 63 12/24/21 1545  Resp 0 12/24/21 1545  SpO2 100 % 12/24/21 1545  Vitals shown include unvalidated device data.  Last Pain:  Vitals:   12/24/21 1046  TempSrc:   PainSc: 0-No pain      Patients Stated Pain Goal: 0 (16/94/50 3888)  Complications: No notable events documented.

## 2021-12-24 NOTE — Anesthesia Procedure Notes (Addendum)
Arterial Line Insertion Start/End12/27/2022 11:05 AM, 12/24/2021 11:15 AM Performed by: Kyung Rudd, CRNA, CRNA  Patient location: Pre-op. Preanesthetic checklist: patient identified, IV checked, site marked, risks and benefits discussed, surgical consent, monitors and equipment checked, pre-op evaluation and timeout performed Lidocaine 1% used for infiltration and patient sedated Left, radial was placed Catheter size: 20 G Hand hygiene performed , maximum sterile barriers used  and Seldinger technique used Allen's test indicative of satisfactory collateral circulation Attempts: 1 Procedure performed without using ultrasound guided technique. Following insertion, Biopatch and dressing applied. Post procedure assessment: normal  Patient tolerated the procedure well with no immediate complications.

## 2021-12-24 NOTE — Procedures (Signed)
Extubation Procedure Note  Patient Details:   Name: Ronald Petersen DOB: 1956-05-19 MRN: [id number]   Airway Documentation:  Airway 8 mm (Active)  Secured at (cm) 22 cm 12/24/21 1947  Measured From Lips 12/24/21 1947  Secured Location Right 12/24/21 1947  Secured By Pink Tape 12/24/21 1947  Site Condition Dry 12/24/21 1947   Vent end date 12/24/2021 Vent end time 2140  Evaluation  O2 sats: stable throughout Complications: No apparent complications Patient did tolerate procedure well. Bilateral Breath Sounds: Clear, Diminished   Pt able to verbalize name after extubation. Extubated to 5L water bottle [place]. Extubated w/o audible cuff leak per MD. VS stable. RT will cont to monitor.   [name] 12/24/2021, 9:40 PM

## 2021-12-24 NOTE — Progress Notes (Signed)
Patient ID: Ronald Petersen, male   DOB: Mar 24, 1956, 65 y.o.   MRN: [id number]   TCTS Evening Rounds:   Hemodynamically stable  CI = 2.3  Has started to wake up on vent.   Urine output good  CT output decreasing  CBC    Component Value Date/Time   WBC 7.5 12/24/2021 1600   RBC 3.62 (L) 12/24/2021 1600   HGB 11.0 (L) 12/24/2021 1600   HGB 13.5 12/09/2021 1014   HCT 31.5 (L) 12/24/2021 1600   HCT 39.3 12/09/2021 1014   PLT 100 (L) 12/24/2021 1600   PLT 176 12/09/2021 1014   MCV 87.0 12/24/2021 1600   MCV 85 12/09/2021 1014   MCH 30.4 12/24/2021 1600   MCHC 34.9 12/24/2021 1600   RDW 12.8 12/24/2021 1600   RDW 12.1 12/09/2021 1014   LYMPHSABS 1.5 12/09/2021 1014   MONOABS 0.4 12/02/2021 1508   EOSABS 0.1 12/09/2021 1014   BASOSABS 0.0 12/09/2021 1014     BMET    Component Value Date/Time   NA 137 12/24/2021 1445   NA 139 12/09/2021 1014   K 4.5 12/24/2021 1445   CL 103 12/24/2021 1441   CO2 27 12/24/2021 0356   GLUCOSE 119 (H) 12/24/2021 1441   GLUCOSE 85 12/10/2006 1603   BUN 21 12/24/2021 1441   BUN 16 12/09/2021 1014   CREATININE 0.70 12/24/2021 1441   CREATININE 0.98 09/06/2020 1138   CALCIUM 9.6 12/24/2021 0356   EGFR 88 12/09/2021 1014   GFRNONAA >60 12/24/2021 0356     A/P:  Stable postop course. Continue current plans. Wean vent when more awake.

## 2021-12-24 NOTE — Op Note (Signed)
[address]       [place] [address]             [phone]                                          12/24/2021 Patient:  Ronald Petersen Pre-Op Dx: Coronary artery disease   Exertional angina   Hypertension   Diabetes mellitus   Hyperlipidemia Post-op Dx: Same Procedure: CABG X 2.  LIMA to LAD.  Reverse saphenous vein graft to obtuse marginal Endoscopic greater saphenous vein harvest on the right   Surgeon and Role:      * Johnthan Axtman, Lucile Crater, MD - [place] An experienced assistant was required given the complexity of this surgery and the standard of surgical care. The assistant was needed for exposure, dissection, suctioning, retraction of delicate tissues and sutures, instrument exchange and for overall help during this procedure.    Anesthesia  general EBL: 500 ml Blood Administration: None Xclamp Time: 34 min Pump Time: 60 min  Drains: 19 F blake drain: L, mediastinal  Wires: Atrial Counts: correct   Indications: This is a 65 year old gentleman was admitted following elective left heart cath was found to have severe left main coronary artery disease.  I personally reviewed his left heart cath.  His LAD lesion is quite tight.  He does have a 40% left main stenosis.  All of his stents are patent.  He does have moderate disease in the PDA, but this likely would not require any bypass at this point.  His echocardiogram was reviewed, and he has preserved biventricular function and no significant valvular disease.  He has been on Brilinta and this will need for washout prior to proceeding with surgery.  Findings: Good conduit.  Good LIMA.  Obtuse marginal was good target with good flows.  The LAD was calcified, and intramyocardial.  Operative Technique: All invasive lines were placed in pre-op holding.  After the risks, benefits and alternatives were thoroughly discussed, the patient was brought to the operative theatre.   Anesthesia was induced, and the patient was prepped and draped in normal sterile fashion.  An appropriate surgical pause was performed, and pre-operative antibiotics were dosed accordingly.  We began with simultaneous incisions along the right leg for harvesting of the greater saphenous vein and the chest for the sternotomy.  In regards to the sternotomy, this was carried down with bovie cautery, and the sternum was divided with a reciprocating saw.  Meticulous hemostasis was obtained.  The left internal thoracic artery was exposed and harvested in in pedicled fashion.  The patient was systemically heparinized, and the artery was divided distally, and placed in a papaverine sponge.    The sternal elevator was removed, and a retractor was placed.  The pericardium was divided in the midline and fashioned into a cradle with pericardial stitches.   After we confirmed an appropriate ACT, the ascending aorta was cannulated in standard fashion.  The right atrial appendage was used for venous cannulation site.  Cardiopulmonary bypass was initiated, and the heart retractor was placed. The cross clamp was applied, and a dose of anterograde cardioplegia was given with good arrest of the heart.  Next we exposed the lateral wall, and found a good target on the OM.  Finally, we exposed a good target on  the LAD, and fashioned an end to side anastomosis between it and the LITA.  We began to re-warm, and a re-animation dose of cardioplegia was given.  The heart was de-aired, and the cross clamp was removed.  Meticulous hemostasis was obtained.    A partial occludding clamp was then placed on the ascending aorta, and we created an end to side anastomosis between it and the proximal vein grafts.  Rings were placed on the proximal anastomosis.  Hemostasis was obtained, and we separated from cardiopulmonary bypass without event.  The heparin was reversed with protamine.  Chest tubes and wires were placed, and the sternum was  re-approximated with sternal wires.  The soft tissue and skin were re-approximated wth absorbable suture.    The patient tolerated the procedure without any immediate complications, and was transferred to the ICU in guarded condition.  Ambra Haverstick Bary Leriche

## 2021-12-24 NOTE — Anesthesia Procedure Notes (Addendum)
Central Venous Catheter Insertion Performed by: Nolon Nations, MD, anesthesiologist Start/End12/27/2022 11:00 AM, 12/24/2021 11:18 AM Patient location: Pre-op. Preanesthetic checklist: patient identified, IV checked, site marked, risks and benefits discussed, surgical consent, monitors and equipment checked, pre-op evaluation, timeout performed and anesthesia consent Position: Trendelenburg Lidocaine 1% used for infiltration and patient sedated Hand hygiene performed  and maximum sterile barriers used  Catheter size: 8.5 Fr Central line was placed.Sheath introducer Procedure performed using ultrasound guided technique. Ultrasound Notes:anatomy identified, needle tip was noted to be adjacent to the nerve/plexus identified, no ultrasound evidence of intravascular and/or intraneural injection and image(s) printed for medical record Attempts: 1 Following insertion, line sutured, dressing applied and Biopatch. Post procedure assessment: blood return through all ports, free fluid flow and no air  Patient tolerated the procedure well with no immediate complications. Additional procedure comments: Triple lumen "SLIC" placed through introducer port.Ronald Petersen

## 2021-12-24 NOTE — Progress Notes (Signed)
°  Echocardiogram Echocardiogram Transesophageal has been performed.  Ronald Petersen 12/24/2021, 4:27 PM

## 2021-12-24 NOTE — Consult Note (Signed)
NAME:  Ronald Petersen, MRN:  [id number], DOB:  15-Feb-1956, LOS: 35 ADMISSION DATE:  12/16/2021, CONSULTATION DATE:  12/27 REFERRING MD:  Kipp Brood, CHIEF COMPLAINT:  post-CABG   History of Present Illness:  Ronald Petersen is a 65 y/o gentleman with a history of HTN, HLD, tobacco abuse, CAD requiring PCI who presented to the hospital on 12/19 for planned [facility] after outpatient cardiology follow up on 12/12 where he was having progressive symptoms of chest discomfort. He was on Ranexa, DAPT, and statin as an outpatient and had been unable to tolerate Imdur. He was started on low-dose amlodipine for additional anti-anginal therapy. During heart catherization he was found to have left main disease in the distal L main involving the bifurcation. He had 40% LCX stenosis and severe 95% ostial LAD stenosis. He underwent 2-vessel CABG on 12/27 after Brilinta washout. He had no issues with arrhythmias, but was sinus bradycardia post-op. Has atrial pacing wires in place.  Pertinent  Medical History  CAD DM 2 HTN GLD GERD OSA  Significant Hospital Events: Including procedures, antibiotic start and stop dates in addition to other pertinent events   12/19 LHC, severe distal L main disease 12/27 CABG x 2  Interim History / Subjective:    Objective   Blood pressure (!) 157/74, pulse (!) 53, temperature 97.6 F (36.4 C), temperature source Oral, resp. rate 12, height 5\' 11"  (1.803 m), weight 83.9 kg, SpO2 100 %.    Vent Mode: PRVC FiO2 (%):  [50 %] 50 % Set Rate:  [12 bmp] 12 bmp Vt Set:  [600 mL] 600 mL PEEP:  [5 cmH20] 5 cmH20 Pressure Support:  [10 cmH20] 10 cmH20 Plateau Pressure:  [15 cmH20] 15 cmH20   Intake/Output Summary (Last 24 hours) at 12/24/2021 1549 Last data filed at 12/24/2021 1518 Gross per 24 hour  Intake 2755.74 ml  Output 1175 ml  Net 1580.74 ml   Filed Weights   12/16/21 0557 12/16/21 1523 12/24/21 1036  Weight: 83.9 kg 84.9 kg 83.9 kg    Examination: General:  critically ill appearing man lying in bed in NAD, intubated, sedated HENT: [place]/AT, eyes anicteric, ETT in place Lungs: CTAB, synchronous with MV Cardiovascular: bloody output from mediastinal drains, S1S2, atrially paced Abdomen: soft, NT Extremities: RLE operative dressings intact, no LLE edema. Neuro: RASS -5, PERRL GU: foley draining clear yellow urine  Resolved Hospital Problem list     Assessment & Plan:  CAD s/p CABG x 2 for distal L main and LAD disease, milder LCX disease -mediastinal drains per TCTS -con't weaning pressors as able to maintain MAP >65. Albumin boluses for low SV.  -con't atrial pacing until intrinsic rate picks up  -pain control per protocol- oxycodone, tramadol, morphine -ASA, statin, holding ranexa -metoprolol BID once off pressors -complete post-op antibiotics -tele monitoring -monitor electrolytes, replete PRN  Acute respiratory failure with hypoxia requiring MV- expected post-op -rapid wean protocol -wean FiO2 as able to maintain SpO2 >90% -VAP prevention protocol  Hyperglycemia, A1c 6.2 -insulin gtt per protocol  GERD -con't PPI  H/o depression -con't zoloft  Former smoker -need to determine if he has a smoking history that would require lung cancer screening  Best Practice (right click and "Reselect all SmartList Selections" daily)   Diet/type: NPO DVT prophylaxis: SCD GI prophylaxis: PPI Lines: Central line and Arterial Line Foley:  Yes, and it is still needed Code Status:  full code Last date of multidisciplinary goals of care discussion [ ]   Labs   CBC:  Recent Labs  Lab 12/20/21 0158 12/21/21 0329 12/22/21 0318 12/23/21 0509 12/24/21 0356 12/24/21 1154 12/24/21 1353 12/24/21 1356 12/24/21 1400 12/24/21 1441 12/24/21 1445  WBC 5.1 4.7 3.8* 4.7 4.5  --   --   --   --   --   --   HGB 12.7* 12.6* 13.0 12.8* 12.0*   < > 9.3* 8.8* 9.2* 8.8* 9.5*  HCT 37.1* 36.8* 37.5* 37.3* 35.4*   < > 26.6* 26.0* 27.0* 26.0* 28.0*  MCV  86.1 86.2 85.8 86.7 86.6  --   --   --   --   --   --   PLT 131* 126* 119* 124* 123*  --  119*  --   --   --   --    < > = values in this interval not displayed.    Basic Metabolic Panel: Recent Labs  Lab 12/20/21 0158 12/23/21 0509 12/24/21 0356 12/24/21 1154 12/24/21 1157 12/24/21 1305 12/24/21 1328 12/24/21 1337 12/24/21 1356 12/24/21 1400 12/24/21 1441 12/24/21 1445  NA 136 138 138 140   < > 138   < > 139 135 135 137 137  K 4.2 4.5 3.8 4.0   < > 3.8   < > 5.1 4.5 4.5 4.4 4.5  CL 102 102 103 101  --  102  --   --  100  --  103  --   CO2 28 26 27   --   --   --   --   --   --   --   --   --   GLUCOSE 122* 113* 138* 121*  --  129*  --   --  102*  --  119*  --   BUN 21 20 22 22   --  21  --   --  20  --  21  --   CREATININE 0.95 1.09 0.99 0.70  --  0.60*  --   --  0.60*  --  0.70  --   CALCIUM 9.6 9.6 9.6  --   --   --   --   --   --   --   --   --    < > = values in this interval not displayed.   GFR: Estimated Creatinine Clearance: 96.7 mL/min (by C-G formula based on SCr of 0.7 mg/dL). Recent Labs  Lab 12/21/21 0329 12/22/21 0318 12/23/21 0509 12/24/21 0356  WBC 4.7 3.8* 4.7 4.5    Liver Function Tests: No results for input(s): AST, ALT, ALKPHOS, BILITOT, PROT, ALBUMIN in the last 168 hours. No results for input(s): LIPASE, AMYLASE in the last 168 hours. No results for input(s): AMMONIA in the last 168 hours.  ABG    Component Value Date/Time   PHART 7.440 12/24/2021 1445   PCO2ART 40.5 12/24/2021 1445   PO2ART 261 (H) 12/24/2021 1445   HCO3 27.5 12/24/2021 1445   TCO2 29 12/24/2021 1445   O2SAT 100.0 12/24/2021 1445     Coagulation Profile: Recent Labs  Lab 12/23/21 1146  INR 1.0    Cardiac Enzymes: No results for input(s): CKTOTAL, CKMB, CKMBINDEX, TROPONINI in the last 168 hours.  HbA1C: Hgb A1c MFr Bld  Date/Time Value Ref Range Status  12/23/2021 11:46 AM 6.2 (H) 4.8 - 5.6 % Final    Comment:    (NOTE) Pre diabetes:           5.7%-6.4%  Diabetes:              >  6.4%  Glycemic control for   <7.0% adults with diabetes   12/16/2021 11:11 PM 6.2 (H) 4.8 - 5.6 % Final    Comment:    (NOTE) Pre diabetes:          5.7%-6.4%  Diabetes:              >6.4%  Glycemic control for   <7.0% adults with diabetes     CBG: Recent Labs  Lab 12/23/21 1116 12/23/21 1639 12/23/21 2157 12/24/21 0849 12/24/21 1022  GLUCAP 177* 124* 158* 105* 101*    Review of Systems:   Unable to be obtained while intubated and sedated.   Past Medical History:  He,  has a past medical history of Anxiety, Arthritis, Cancer ([place]), Complication of anesthesia, Depression, Diabetes mellitus Type II, GERD (gastroesophageal reflux disease), Hyperlipidemia, Hypertension, Neuromuscular disorder ([place]), Right rotator cuff tear (11/23/2018), and Sleep apnea.   Surgical History:   Past Surgical History:  Procedure Laterality Date   APPENDECTOMY     ARTHOSCOPIC ROTAOR CUFF REPAIR Right 11/23/2018   Procedure: ARTHROSCOPIC ROTATOR CUFF REPAIR;  Surgeon: [name], MD;  Location: [place];  Service: Orthopedics;  Laterality: Right;   CHOLECYSTECTOMY     CORONARY STENT INTERVENTION N/A 11/19/2020   Procedure: CORONARY STENT INTERVENTION;  Surgeon: Nelva Bush, MD;  Location: [place] [facility];  Service: Cardiovascular;  Laterality: N/A;   INTRAVASCULAR ULTRASOUND/IVUS N/A 11/19/2020   Procedure: Intravascular Ultrasound/IVUS;  Surgeon: Nelva Bush, MD;  Location: [place] [facility];  Service: Cardiovascular;  Laterality: N/A;   LEFT HEART CATH AND CORONARY ANGIOGRAPHY N/A 11/19/2020   Procedure: LEFT HEART CATH AND CORONARY ANGIOGRAPHY;  Surgeon: Nelva Bush, MD;  Location: [place] [facility];  Service: Cardiovascular;  Laterality: N/A;   LEFT HEART CATH AND CORONARY ANGIOGRAPHY N/A 12/24/2020   Procedure: LEFT HEART CATH AND CORONARY ANGIOGRAPHY;  Surgeon: Martinique, Peter M, MD;  Location: [place] [facility];   Service: Cardiovascular;  Laterality: N/A;   LEFT HEART CATH AND CORONARY ANGIOGRAPHY N/A 12/16/2021   Procedure: LEFT HEART CATH AND CORONARY ANGIOGRAPHY;  Surgeon: Leonie Man, MD;  Location: [place] [facility];  Service: Cardiovascular;  Laterality: N/A;   LUMBAR LAMINECTOMY     SHOULDER ARTHROSCOPY WITH ROTATOR CUFF REPAIR AND SUBACROMIAL DECOMPRESSION Right 11/23/2018   Procedure: SHOULDER ARTHROSCOPY WITH ROTATOR CUFF REPAIR AND SUBACROMIAL DECOMPRESSION;  Surgeon: [name], MD;  Location: [place];  Service: Orthopedics;  Laterality: Right;     Social History:   reports that he quit smoking about 36 years ago. His smoking use included cigarettes. He has never used smokeless tobacco. He reports current alcohol use of about 4.0 - 5.0 standard drinks per week. He reports that he does not use drugs.   Family History:  His family history includes Alcohol abuse in an other family member; Arthritis in an other family member; COPD in his mother; Coronary artery disease in an other family member; Depression in an other family member; Heart disease in his sister and sister; Hypertension in an other family member; Ovarian cancer in his sister and other family members; Stomach cancer in his maternal grandmother and sister; Stroke in his father. There is no history of Colon polyps or Colon cancer.   Allergies Allergies  Allergen Reactions   Niacin Anaphylaxis     Home Medications  Prior to Admission medications   Medication Sig Start Date End Date Taking? Authorizing Provider  acetaminophen (TYLENOL) 650 MG CR tablet  Take 1,300 mg by mouth every 8 (eight) hours as needed for pain.   Yes [provider]  aspirin EC 81 MG tablet Take 1 tablet (81 mg total) by mouth daily. Swallow whole. 11/14/20  Yes Tobb, Kardie, DO  Cyanocobalamin (B-12) 2500 MCG TABS Take 2,500 mcg by mouth daily.   Yes [provider]  dicyclomine (BENTYL) 10 MG capsule Take 1  capsule (10 mg total) by mouth every 8 (eight) hours as needed for spasms. 10/30/21  Yes Armbruster, Carlota Raspberry, MD  ELDERBERRY PO Take 300 mg by mouth daily.   Yes [provider]  fenofibrate 160 MG tablet TAKE 1 TABLET (160 MG TOTAL) BY MOUTH DAILY. 05/03/21 05/03/22 Yes Lowne Lyndal Pulley R, DO  furosemide (LASIX) 40 MG tablet TAKE 1 TABLET (40 MG TOTAL) BY MOUTH EVERY OTHER DAY. 03/05/21 03/05/22 Yes Tobb, Kardie, DO  gabapentin (NEURONTIN) 800 MG tablet Take 1 tablet (800 mg total) by mouth 3 (three) times daily. 11/25/21  Yes Hyatt, Max T, DPM  lisinopril (ZESTRIL) 5 MG tablet TAKE 1 TABLET (5 MG TOTAL) BY MOUTH DAILY. 10/22/21 10/22/22 Yes [name], DO  Magnesium Citrate 200 MG TABS Take 400 mg by mouth daily.   Yes [provider]  metFORMIN (GLUCOPHAGE-XR) 500 MG 24 hr tablet Take 1 tablet (500 mg total) by mouth daily with breakfast. 07/30/21 07/30/22 Yes Shamleffer, Melanie Crazier, MD  nitroGLYCERIN (NITROSTAT) 0.4 MG SL tablet PLACE 1 TABLET (0.4 MG TOTAL) UNDER THE TONGUE EVERY 5 (FIVE) MINUTES AS NEEDED FOR CHEST PAIN. 10/02/20 12/16/21 Yes Tobb, Kardie, DO  ondansetron (ZOFRAN) 4 MG tablet Take 1 tablet (4 mg total) by mouth every 8 (eight) hours as needed. 11/05/21 11/05/22 Yes Lowne Lyndal Pulley R, DO  pantoprazole (PROTONIX) 40 MG tablet TAKE 1 TABLET (40 MG TOTAL) BY MOUTH TWICE A DAY 12/02/21  Yes Roma Schanz R, DO  polyethylene glycol powder (GLYCOLAX/MIRALAX) 17 GM/SCOOP powder Take 17 g by mouth daily. Increase to twice a day as needed Patient taking differently: Take 17 g by mouth 2 (two) times daily as needed for moderate constipation. 11/05/21  Yes Roma Schanz R, DO  potassium chloride SA (KLOR-CON) 20 MEQ tablet TAKE 1 TABLET (20 MEQ TOTAL) BY MOUTH EVERY OTHER DAY. 03/05/21 03/05/22 Yes Tobb, Kardie, DO  ranolazine (RANEXA) 1000 MG SR tablet TAKE 1 TABLET (1,000 MG TOTAL) BY MOUTH 2 (TWO) TIMES DAILY. 09/25/21 09/25/22 Yes Tobb, Kardie, DO   rosuvastatin (CRESTOR) 40 MG tablet TAKE 1 TABLET BY MOUTH ONCE DAILY 12/11/20 01/23/22 Yes Tobb, Kardie, DO  sertraline (ZOLOFT) 100 MG tablet TAKE ONE AND ONE-HALF TABLET BY MOUTH ONCE DAILY 03/07/21 03/07/22 Yes [name], DO  ticagrelor (BRILINTA) 90 MG TABS tablet Take 1 tablet (90 mg total) by mouth 2 (two) times daily. 12/09/21  Yes Tobb, Kardie, DO  tiZANidine (ZANAFLEX) 4 MG tablet TAKE 1 TABLET (4 MG TOTAL) BY MOUTH EVERY 6 (SIX) HOURS AS NEEDED FOR MUSCLE SPASMS. 10/22/21  Yes Roma Schanz R, DO  traZODone (DESYREL) 50 MG tablet TAKE 1/2-1 TABLET (25-50 MG TOTAL) BY MOUTH AT BEDTIME AS NEEDED FOR SLEEP. Patient taking differently: Take 50 mg by mouth at bedtime. 08/26/21 08/26/22 Yes [name], DO  vitamin E 180 MG (400 UNITS) capsule Take 1,600 Units by mouth daily.   Yes [provider]  amLODipine (NORVASC) 2.5 MG tablet Take 1 tablet (2.5 mg total) by mouth daily. 12/09/21 03/12/22  Berniece Salines, DO  Continuous Blood Gluc Receiver (FREESTYLE LIBRE 2 READER) DEVI Use as directed to check blood sugar 07/30/21   Shamleffer, Melanie Crazier, MD  Continuous Blood Gluc Sensor (FREESTYLE LIBRE 2 SENSOR) MISC Use to check blood sugar, change every 14 days 07/30/21   Shamleffer, Melanie Crazier, MD  glucose blood test strip USE AS INSTRUCTED TO CHECK BLOOD SUGAR ONCE A DAY 01/22/21 01/26/22  Shamleffer, Melanie Crazier, MD  oxyCODONE-acetaminophen (PERCOCET/ROXICET) 5-325 MG tablet TAKE 1 TABLET BY MOUTH EVERY 4 (FOUR) HOURS AS NEEDED FOR SEVERE PAIN. Patient not taking: Reported on 12/09/2021 10/22/21 04/20/22  Roma Schanz R, DO  metoprolol tartrate (LOPRESSOR) 100 MG tablet Take 1 tablet (100 mg total) by mouth once for 1 dose. Take two hours prior to your cardiac CT Patient taking differently: Take 100 mg by mouth daily.  10/02/20 11/20/20  Tobb, Godfrey Pick, DO     Critical care time: 40 min.     Julian Hy, DO 12/24/21 4:40 PM [facility]

## 2021-12-24 NOTE — Progress Notes (Signed)
°   °  [address]       [place] [address]             [phone]       No events Vitals:   12/23/21 2346 12/24/21 0004  BP: (!) 99/57 (!) 105/57  Pulse:    Resp:    Temp:    SpO2:      Alert NAD Sinus brady EWOB  OR today for CABG  Ronald Petersen

## 2021-12-24 NOTE — Progress Notes (Signed)
EKG compared to and EKG from 4/22. No acute ischemic changes noted. Also reviewed by Yetta Glassman RN. Pt states pain is "about a 3" and is same as it has been all day. Pt appears to be resting comfortably at this time. Will continue to monitor closely. Jessie Foot, RN

## 2021-12-24 NOTE — Anesthesia Procedure Notes (Signed)
Procedure Name: Intubation Date/Time: 12/24/2021 11:44 AM Performed by: Kyung Rudd, CRNA Pre-anesthesia Checklist: Patient identified, Emergency Drugs available, Suction available, Patient being monitored and Timeout performed Patient Re-evaluated:Patient Re-evaluated prior to induction Oxygen Delivery Method: Circle system utilized Preoxygenation: Pre-oxygenation with 100% oxygen Induction Type: IV induction Ventilation: Mask ventilation without difficulty and Oral airway inserted - appropriate to patient size Laryngoscope Size: Mac and 4 Grade View: Grade I Tube type: Oral Tube size: 8.0 mm Number of attempts: 1 Airway Equipment and Method: Stylet Placement Confirmation: ETT inserted through vocal cords under direct vision and positive ETCO2 Secured at: 22 cm Tube secured with: Tape Dental Injury: Teeth and Oropharynx as per pre-operative assessment

## 2021-12-24 NOTE — Hospital Course (Addendum)
°  History of Present Illness:     This is a 65 year old male with a past medical history of coronary artery disease (previous DES stents x 2), hypertension, hyperlipidemia, diabetes mellitus, neuropathy, OSA, remote tobacco abuse who has been having intermittent episodes of chest pain with exertion. Associated with this was nausea. He denies shortness of breath, diaphoresis, syncope. Per patient, he thought it might be his "reflux acting up" so he took extra Tums. These did not help so he took a Nitro. Of note, he needs cervical fusion C5-6 and C6-7. He was in the process of getting cardiology clearance for this when he experienced intermittent, worsening chest pain. He presented to [facility] on 12/16/2021 in order to undergo a cardiac catheterization (via right radial artery). Cardiac catheterization results showed 40% left main stenosis, ostial to proximal LAD with a 95% stenosis, proximal LAD to mid LAD DES stent is widely patent, proximal Circumflex to OM1 DES stent is widely patent, and RPAV lesion is 50% stenosed. Echo ordered; await results. Dr. Kipp Brood has been consulted for consideration of coronary artery bypass grafting surgery.  He reviewed the patient and all relevant studies and recommended proceeding with coronary artery surgical revascularization.  He remained in the hospital for Brilinta washout with heparin bridge and remained stable.  Hospital course:  Following cardiac catheterization on 12/16/2021 he remained in the hospital and surgery was scheduled for 12/24/2021.  Patient was taken to the operating room and tolerated the procedure well: CABG x2 with a left internal mammary artery graft to the LAD and a saphenous vein graft to the obtuse marginal.  He tolerated the procedure well was taken to the surgical ICU in stable condition.   Postoperative hospital course:  The patient was weaned from the ventilator using standard post cardiac surgical protocols without difficulty.  He has  remained hemodynamically stable and neowas weaned off.  He has a mild expected acute blood loss anemia.  Renal function has remained within normal limits.  He has had some early postoperative nausea which is responding to the usual therapies.  All routine lines, monitors and drainage devices have been discontinued in standard fashion.  On postop day #2 his home Brilinta was resumed.  He was transferred to 4 E. telemetry unit for ongoing cardiac rehab and telemetry observation.  Brilinta was subsequently continued and he was kept on aspirin for antiplatelet therapy.  He has a history of Plavix resistance so is not felt to be an option.  This is tested for as an outpatient.  Blood pressure is felt to be close to initiate ARB/ACE inhibitor therapy but it may be able to be started as an outpatient depending on hypotension status.  His anemia has stabilized.  Thrombocytopenia has shown an improving trend.  Blood sugars have been under adequate control and cardiology has started him on Jardiance and therefore preoperative metformin was not resumed.  Incisions are all noted to be healing well without evidence of infection.  He was weaned off oxygen.  His status at the time of discharge was felt to be quite stable.

## 2021-12-24 NOTE — Brief Op Note (Signed)
12/16/2021 - 12/24/2021  6:59 AM  PATIENT:  Ronald Petersen D Kirchhoff  65 y.o. male  PRE-OPERATIVE DIAGNOSIS:  Coronary Artery Disease Left Main Disease  POST-OPERATIVE DIAGNOSIS:  Coronary Artery Disease Left Main Disease  PROCEDURE:  Procedure(s): CORONARY ARTERY BYPASS GRAFTING (CABG), ON PUMP, TIMES TWO, USING LEFT INTERNAL MAMMARY ARTERY AND RIGHT ENDOSCOPICALLY HARVESTED GREATER SAPHENOUS VEIN (N/A) TRANSESOPHAGEAL ECHOCARDIOGRAM (TEE) (N/A) ENDOVEIN HARVEST OF GREATER SAPHENOUS VEIN (Right) LIMA-LAD SVG-OM EVH 25/15  SURGEON:  Surgeon(s) and Role:    * Lightfoot, Lucile Crater, MD - Primary  PHYSICIAN ASSISTANT: Sahir Tolson PA-C  ASSISTANTS: STAFF   ANESTHESIA:   general  EBL:  see anest/perfusion records  BLOOD ADMINISTERED:none  DRAINS:  LEFT PLEURAL AND MEDIASTINAL CHEST TUBES    LOCAL MEDICATIONS USED:  NONE  SPECIMEN:  No Specimen  DISPOSITION OF SPECIMEN:  N/A  COUNTS:  YES  TOURNIQUET:  * No tourniquets in log *  DICTATION: .Dragon Dictation  PLAN OF CARE: Admit to inpatient   PATIENT DISPOSITION:  ICU - intubated and hemodynamically stable.   Delay start of Pharmacological VTE agent (>24hrs) due to surgical blood loss or risk of bleeding: yes  COMPLICATIONS: NO KNOWN

## 2021-12-25 ENCOUNTER — Encounter (HOSPITAL_COMMUNITY): Payer: Self-pay | Admitting: Thoracic Surgery (Cardiothoracic Vascular Surgery)

## 2021-12-25 ENCOUNTER — Inpatient Hospital Stay (HOSPITAL_COMMUNITY): Payer: Medicare HMO

## 2021-12-25 DIAGNOSIS — Z9911 Dependence on respirator [ventilator] status: Secondary | ICD-10-CM | POA: Diagnosis not present

## 2021-12-25 DIAGNOSIS — I25110 Atherosclerotic heart disease of native coronary artery with unstable angina pectoris: Secondary | ICD-10-CM | POA: Diagnosis not present

## 2021-12-25 DIAGNOSIS — E1169 Type 2 diabetes mellitus with other specified complication: Secondary | ICD-10-CM | POA: Diagnosis not present

## 2021-12-25 DIAGNOSIS — I10 Essential (primary) hypertension: Secondary | ICD-10-CM | POA: Diagnosis not present

## 2021-12-25 DIAGNOSIS — Z951 Presence of aortocoronary bypass graft: Secondary | ICD-10-CM | POA: Diagnosis not present

## 2021-12-25 DIAGNOSIS — E785 Hyperlipidemia, unspecified: Secondary | ICD-10-CM

## 2021-12-25 LAB — CBC
HCT: 30.5 % — ABNORMAL LOW (ref 39.0–52.0)
HCT: 28.5 % — ABNORMAL LOW (ref 39.0–52.0)
Hemoglobin: 10.1 g/dL — ABNORMAL LOW (ref 13.0–17.0)
Hemoglobin: 9.3 g/dL — ABNORMAL LOW (ref 13.0–17.0)
MCH: 29.4 pg (ref 26.0–34.0)
MCH: 29.5 pg (ref 26.0–34.0)
MCHC: 33.1 g/dL (ref 30.0–36.0)
MCHC: 32.6 g/dL (ref 30.0–36.0)
MCV: 88.9 fL (ref 80.0–100.0)
MCV: 90.5 fL (ref 80.0–100.0)
Platelets: 142 10*3/uL — ABNORMAL LOW (ref 150–400)
Platelets: 121 10*3/uL — ABNORMAL LOW (ref 150–400)
RBC: 3.43 MIL/uL — ABNORMAL LOW (ref 4.22–5.81)
RBC: 3.15 MIL/uL — ABNORMAL LOW (ref 4.22–5.81)
RDW: 13.2 % (ref 11.5–15.5)
RDW: 13.5 % (ref 11.5–15.5)
WBC: 11.9 10*3/uL — ABNORMAL HIGH (ref 4.0–10.5)
WBC: 9.5 10*3/uL (ref 4.0–10.5)
nRBC: 0 % (ref 0.0–0.2)
nRBC: 0 % (ref 0.0–0.2)

## 2021-12-25 LAB — GLUCOSE, CAPILLARY
Glucose-Capillary: 139 mg/dL — ABNORMAL HIGH (ref 70–99)
Glucose-Capillary: 121 mg/dL — ABNORMAL HIGH (ref 70–99)
Glucose-Capillary: 122 mg/dL — ABNORMAL HIGH (ref 70–99)
Glucose-Capillary: 114 mg/dL — ABNORMAL HIGH (ref 70–99)
Glucose-Capillary: 136 mg/dL — ABNORMAL HIGH (ref 70–99)
Glucose-Capillary: 157 mg/dL — ABNORMAL HIGH (ref 70–99)
Glucose-Capillary: 140 mg/dL — ABNORMAL HIGH (ref 70–99)
Glucose-Capillary: 119 mg/dL — ABNORMAL HIGH (ref 70–99)
Glucose-Capillary: 142 mg/dL — ABNORMAL HIGH (ref 70–99)

## 2021-12-25 LAB — BASIC METABOLIC PANEL
Anion gap: 5 (ref 5–15)
Anion gap: 8 (ref 5–15)
BUN: 18 mg/dL (ref 8–23)
BUN: 20 mg/dL (ref 8–23)
CO2: 24 mmol/L (ref 22–32)
CO2: 25 mmol/L (ref 22–32)
Calcium: 8.3 mg/dL — ABNORMAL LOW (ref 8.9–10.3)
Calcium: 9.1 mg/dL (ref 8.9–10.3)
Chloride: 108 mmol/L (ref 98–111)
Chloride: 105 mmol/L (ref 98–111)
Creatinine, Ser: 0.79 mg/dL (ref 0.61–1.24)
Creatinine, Ser: 0.93 mg/dL (ref 0.61–1.24)
GFR, Estimated: >60 mL/min (ref >60)
GFR, Estimated: >60 mL/min (ref >60)
Glucose, Bld: 118 mg/dL — ABNORMAL HIGH (ref 70–99)
Glucose, Bld: 170 mg/dL — ABNORMAL HIGH (ref 70–99)
Potassium: 3.9 mmol/L (ref 3.5–5.1)
Potassium: 4 mmol/L (ref 3.5–5.1)
Sodium: 137 mmol/L (ref 135–145)
Sodium: 138 mmol/L (ref 135–145)

## 2021-12-25 LAB — MAGNESIUM
Magnesium: 2.1 mg/dL (ref 1.7–2.4)
Magnesium: 2.2 mg/dL (ref 1.7–2.4)

## 2021-12-25 MED ORDER — ALBUMIN HUMAN 5 % IV SOLN
12.5000 g | Freq: Once | INTRAVENOUS | Status: AC
  Administered 2021-12-25: 09:00:00 12.5 g via INTRAVENOUS
  Filled 2021-12-25: qty 250

## 2021-12-25 MED ORDER — INSULIN ASPART 100 UNIT/ML IJ SOLN
0.0000 [IU] | INTRAMUSCULAR | Status: DC
  Administered 2021-12-25 – 2021-12-27 (×8): 2 [IU] via SUBCUTANEOUS

## 2021-12-25 MED ORDER — SODIUM CHLORIDE 0.9 % IV SOLN
12.5000 mg | Freq: Four times a day (QID) | INTRAVENOUS | Status: DC | PRN
  Administered 2021-12-25 (×3): 12.5 mg via INTRAVENOUS
  Filled 2021-12-25 (×4): qty 0.5

## 2021-12-25 MED ORDER — METOCLOPRAMIDE HCL 5 MG/ML IJ SOLN
5.0000 mg | Freq: Four times a day (QID) | INTRAMUSCULAR | Status: DC | PRN
  Administered 2021-12-25 – 2021-12-26 (×3): 5 mg via INTRAVENOUS
  Filled 2021-12-25 (×3): qty 2

## 2021-12-25 MED FILL — Lidocaine HCl Local Preservative Free (PF) Inj 2%: INTRAMUSCULAR | Qty: 15 | Status: AC

## 2021-12-25 MED FILL — Potassium Chloride Inj 2 mEq/ML: INTRAVENOUS | Qty: 40 | Status: AC

## 2021-12-25 MED FILL — Heparin Sodium (Porcine) Inj 1000 Unit/ML: Qty: 1000 | Status: AC

## 2021-12-25 NOTE — Progress Notes (Signed)
BiPAP held due to continuing nausea. RT will cont to monitor.

## 2021-12-25 NOTE — Progress Notes (Signed)
[address]       [place] [address]             [phone]                 1 Day Post-Op Procedure(s) (LRB): CORONARY ARTERY BYPASS GRAFTING (CABG), ON PUMP, TIMES TWO, USING LEFT INTERNAL MAMMARY ARTERY AND RIGHT ENDOSCOPICALLY HARVESTED GREATER SAPHENOUS VEIN (N/A) TRANSESOPHAGEAL ECHOCARDIOGRAM (TEE) (N/A) ENDOVEIN HARVEST OF GREATER SAPHENOUS VEIN (Right)   Events: No events Complains of nauseas _______________________________________________________________ Vitals: BP 106/63 (BP Location: Right Arm)    Pulse 62    Temp 99.3 F (37.4 C)    Resp 19    Ht 5\' 11"  (1.803 m)    Wt 85.5 kg    SpO2 95%    BMI 26.30 kg/m  Filed Weights   12/16/21 1523 12/24/21 1036 12/25/21 0422  Weight: 84.9 kg 83.9 kg 85.5 kg     - Neuro: alert NAD  - Cardiovascular: sinus  Drips: neo 30.   CVP:  [0 mmHg-18 mmHg] 3 mmHg  - Pulm: EWOB   ABG    Component Value Date/Time   PHART 7.269 (L) 12/24/2021 2326   PCO2ART 54.8 (H) 12/24/2021 2326   PO2ART 206 (H) 12/24/2021 2326   HCO3 24.9 12/24/2021 2326   TCO2 27 12/24/2021 2326   ACIDBASEDEF 2.0 12/24/2021 2326   O2SAT 100.0 12/24/2021 2326    - Abd: ND - Extremity: warm  .Intake/Output      12/27 0701 12/28 0700 12/28 0701 12/29 0700   I.V. (mL/kg) 3023.1 (35.4) 101.9 (1.2)   Blood 391    IV Piggyback 1464.8    Total Intake(mL/kg) 4878.9 (57.1) 101.9 (1.2)   Urine (mL/kg/hr) 3130 (1.5) 100 (0.6)   Emesis/NG output 80    Stool 0    Chest Tube 590 30   Total Output 3800 130   Net +1078.9 -28.1        Urine Occurrence 0 x    Stool Occurrence 0 x    Emesis Occurrence 2 x       _______________________________________________________________ Labs: CBC Latest Ref Rng & Units 12/25/2021 12/24/2021 12/24/2021  WBC 4.0 - 10.5 K/uL 11.9(H) - -  Hemoglobin 13.0 - 17.0 g/dL 10.1(L) 10.2(L) 10.2(L)  Hematocrit 39.0 - 52.0 % 30.5(L) 30.0(L) 30.0(L)  Platelets 150 - 400 K/uL 142(L) - -   CMP Latest  Ref Rng & Units 12/25/2021 12/24/2021 12/24/2021  Glucose 70 - 99 mg/dL 118(H) - -  BUN 8 - 23 mg/dL 18 - -  Creatinine 0.61 - 1.24 mg/dL 0.79 - -  Sodium 135 - 145 mmol/L 137 142 142  Potassium 3.5 - 5.1 mmol/L 3.9 3.8 4.1  Chloride 98 - 111 mmol/L 108 - -  CO2 22 - 32 mmol/L 24 - -  Calcium 8.9 - 10.3 mg/dL 8.3(L) - -  Total Protein 6.0 - 8.3 g/dL - - -  Total Bilirubin 0.2 - 1.2 mg/dL - - -  Alkaline Phos 39 - 117 U/L - - -  AST 0 - 37 U/L - - -  ALT 0 - 53 U/L - - -    CXR: PV congestion  _______________________________________________________________  Assessment and Plan: POD 1 s/p CABG  Neuro: pain controlled CV: weaning neo.  On A/S.  Will keep wires today Pulm: pulm hygiene Renal: creat stable GI: adding phenergan Heme: stable ID: [email or web address] Endo: SSI Dispo: continue ICU care   Lajuana Matte 12/25/2021 8:55 AM

## 2021-12-25 NOTE — Progress Notes (Signed)
NAME:  Ronald Petersen, MRN:  [id number], DOB:  [birth year], LOS: 71 ADMISSION DATE:  12/16/2021, CONSULTATION DATE:  12/27 REFERRING MD:  Kipp Brood, CHIEF COMPLAINT:  post-CABG   History of Present Illness:  Ronald Petersen is a 65 y/o gentleman with a history of HTN, HLD, tobacco abuse, CAD requiring PCI who presented to the hospital on 12/19 for planned [facility] after outpatient cardiology follow up on 12/12 where he was having progressive symptoms of chest discomfort. He was on Ranexa, DAPT, and statin as an outpatient and had been unable to tolerate Imdur. He was started on low-dose amlodipine for additional anti-anginal therapy. During heart catherization he was found to have left main disease in the distal L main involving the bifurcation. He had 40% LCX stenosis and severe 95% ostial LAD stenosis. He underwent 2-vessel CABG on 12/27 after Brilinta washout. He had no issues with arrhythmias, but was sinus bradycardia post-op. Has atrial pacing wires in place.  Pertinent  Medical History  CAD DM 2 HTN GLD GERD OSA  Significant Hospital Events: Including procedures, antibiotic start and stop dates in addition to other pertinent events   12/19 LHC, severe distal L main disease 12/27 CABG x 2  Interim History / Subjective:  Extubated last evening around 2200 Nausea with emesis overnight, now improved after phenergan and lots of belching Off pressors this morning after albumin Afebrile   Objective   Blood pressure (!) 128/115, pulse 61, temperature 99.1 F (37.3 C), resp. rate 20, height 5\' 11"  (1.803 m), weight 85.5 kg, SpO2 96 %. CVP:  [0 mmHg-18 mmHg] 3 mmHg  Vent Mode: PRVC FiO2 (%):  [40 %-50 %] 40 % Set Rate:  [4 bmp-12 bmp] 4 bmp Vt Set:  [600 mL] 600 mL PEEP:  [5 cmH20] 5 cmH20 Pressure Support:  [10 cmH20] 10 cmH20 Plateau Pressure:  [11 cmH20-15 cmH20] 11 cmH20   Intake/Output Summary (Last 24 hours) at 12/25/2021 0951 Last data filed at 12/25/2021 0900 Gross per 24 hour   Intake 5060.94 ml  Output 3930 ml  Net 1130.94 ml   Filed Weights   12/16/21 1523 12/24/21 1036 12/25/21 0422  Weight: 84.9 kg 83.9 kg 85.5 kg    Examination: General:  older male sitting in recliner in NAD HEENT: MM pink/moist Neuro: sleepy but oriented x 3, MAE CV: NSR, no murmur, sternal dressing cdi, pleural and mediastinal tubes, pacing wires remain PULM:  non labored, CTA, 400-500 on IS GI: soft, bs+, NT, foley  Extremities: warm/dry, no LE edema  Skin: no rashes   Labs reviewed: stable renal function, WBC 13.2 > 11.9, plts stable 141 > 142, Hgb 11.3 > 10.1  UOP 1.1L/ 12hrs Net +4.4L  No BM yet  CT output 220 ml/ 12hrs    Resolved Hospital Problem list     Assessment & Plan:  CAD s/p CABG x 2 for distal L main and LAD disease, milder LCX disease -mediastinal drains per TCTS - back to intrinsic rate, has not needing pacing - goal MAP > 65, off pressors  -pain control per protocol- oxycodone, tramadol, morphine w/ bowel regimen -cont ASA, statin, holding ranexa -metoprolol BID now off pressors, to start tonight -complete post-op antibiotics -tele monitoring -monitor electrolytes, replete PRN - prn phenergan for nausea  - mobilize today   Acute respiratory failure with hypoxia requiring MV- expected post-op - extubated 12/27 - aggressive pulm hygiene with IS, mobilize - wean O2 for sat goal > 92%  Hyperglycemia, A1c 6.2 - transitioning off insulin  gtt   GERD - PPI  H/o depression -con't zoloft  Former smoker -need to determine if he has a smoking history that would require lung cancer screening  PCCM will follow while in ICU  Best Practice (right click and "Reselect all SmartList Selections" daily)   Diet/type: Regular consistency (see orders)  DVT prophylaxis: SCD GI prophylaxis: PPI Lines: Central line and Arterial Line; aline d/c 12/28 Foley:  Yes, and it is no longer needed- to be removed Code Status:  full code Last date of  multidisciplinary goals of care discussion [ ]   Labs   CBC: Recent Labs  Lab 12/23/21 0509 12/24/21 0356 12/24/21 1154 12/24/21 1353 12/24/21 1356 12/24/21 1600 12/24/21 1602 12/24/21 2102 12/24/21 2109 12/24/21 2326 12/25/21 0309  WBC 4.7 4.5  --   --   --  7.5  --  13.2*  --   --  11.9*  HGB 12.8* 12.0*   < > 9.3*   < > 11.0* 10.5* 11.3* 10.2* 10.2* 10.1*  HCT 37.3* 35.4*   < > 26.6*   < > 31.5* 31.0* 33.1* 30.0* 30.0* 30.5*  MCV 86.7 86.6  --   --   --  87.0  --  87.3  --   --  88.9  PLT 124* 123*  --  119*  --  100*  --  141*  --   --  142*   < > = values in this interval not displayed.    Basic Metabolic Panel: Recent Labs  Lab 12/20/21 0158 12/23/21 0509 12/24/21 0356 12/24/21 1154 12/24/21 1305 12/24/21 1328 12/24/21 1356 12/24/21 1400 12/24/21 1441 12/24/21 1445 12/24/21 1602 12/24/21 2102 12/24/21 2109 12/24/21 2326 12/25/21 0309  NA 136 138 138   < > 138   < > 135   < > 137   < > 140 141 142 142 137  K 4.2 4.5 3.8   < > 3.8   < > 4.5   < > 4.4   < > 3.8 3.9 4.1 3.8 3.9  CL 102 102 103   < > 102  --  100  --  103  --   --  112*  --   --  108  CO2 28 26 27   --   --   --   --   --   --   --   --  24  --   --  24  GLUCOSE 122* 113* 138*   < > 129*  --  102*  --  119*  --   --  121*  --   --  118*  BUN 21 20 22    < > 21  --  20  --  21  --   --  18  --   --  18  CREATININE 0.95 1.09 0.99   < > 0.60*  --  0.60*  --  0.70  --   --  0.88  --   --  0.79  CALCIUM 9.6 9.6 9.6  --   --   --   --   --   --   --   --  8.1*  --   --  8.3*  MG  --   --   --   --   --   --   --   --   --   --   --  2.5*  --   --  2.1   < > = values  in this interval not displayed.   GFR: Estimated Creatinine Clearance: 96.7 mL/min (by C-G formula based on SCr of 0.79 mg/dL). Recent Labs  Lab 12/24/21 0356 12/24/21 1600 12/24/21 2102 12/25/21 0309  WBC 4.5 7.5 13.2* 11.9*    Liver Function Tests: No results for input(s): AST, ALT, ALKPHOS, BILITOT, PROT, ALBUMIN in the last  168 hours. No results for input(s): LIPASE, AMYLASE in the last 168 hours. No results for input(s): AMMONIA in the last 168 hours.  ABG    Component Value Date/Time   PHART 7.269 (L) 12/24/2021 2326   PCO2ART 54.8 (H) 12/24/2021 2326   PO2ART 206 (H) 12/24/2021 2326   HCO3 24.9 12/24/2021 2326   TCO2 27 12/24/2021 2326   ACIDBASEDEF 2.0 12/24/2021 2326   O2SAT 100.0 12/24/2021 2326     Coagulation Profile: Recent Labs  Lab 12/23/21 1146 12/24/21 1600  INR 1.0 1.3*    Cardiac Enzymes: No results for input(s): CKTOTAL, CKMB, CKMBINDEX, TROPONINI in the last 168 hours.  HbA1C: Hgb A1c MFr Bld  Date/Time Value Ref Range Status  12/23/2021 11:46 AM 6.2 (H) 4.8 - 5.6 % Final    Comment:    (NOTE) Pre diabetes:          5.7%-6.4%  Diabetes:              >6.4%  Glycemic control for   <7.0% adults with diabetes   12/16/2021 11:11 PM 6.2 (H) 4.8 - 5.6 % Final    Comment:    (NOTE) Pre diabetes:          5.7%-6.4%  Diabetes:              >6.4%  Glycemic control for   <7.0% adults with diabetes     CBG: Recent Labs  Lab 12/24/21 2159 12/24/21 2324 12/25/21 0107 12/25/21 0309 12/25/21 0504  GLUCAP 133* 139* 121* 122* 114*    Critical care time: n/a        Kennieth Rad, ACNP [facility] 12/25/2021, 9:51 AM  See Amion for pager If no response to pager, please call PCCM consult pager After 7:00 pm call Elink

## 2021-12-25 NOTE — Progress Notes (Signed)
Progress Note  Patient Name: Ronald Petersen Date of Encounter: 12/25/2021  [facility] Cardiologist: Berniece Salines, DO   Subjective   Complains of nausea this morning. Also has pain with inspiration at the chest tube sites.   Inpatient Medications    Scheduled Meds:  acetaminophen  1,000 mg Oral Q6H   Or   acetaminophen (TYLENOL) oral liquid 160 mg/5 mL  1,000 mg Per Tube Q6H   aspirin EC  325 mg Oral Daily   Or   aspirin  324 mg Per Tube Daily   bisacodyl  10 mg Oral Daily   Or   bisacodyl  10 mg Rectal Daily   Chlorhexidine Gluconate Cloth  6 each Topical Daily   docusate sodium  200 mg Oral Daily   insulin aspart  0-24 Units Subcutaneous Q4H   mouth rinse  15 mL Mouth Rinse BID   metoprolol tartrate  12.5 mg Oral BID   [START ON 12/26/2021] pantoprazole  40 mg Oral Daily   rosuvastatin  40 mg Oral Daily   sertraline  150 mg Oral Daily   sodium chloride flush  10-40 mL Intracatheter Q12H   sodium chloride flush  3 mL Intravenous Q12H   Continuous Infusions:  sodium chloride 20 mL/hr at 12/25/21 0800   sodium chloride     sodium chloride     albumin human Stopped (12/24/21 1930)   albumin human      ceFAZolin (ANCEF) IV Stopped (12/25/21 0538)   insulin 2 Units/hr (12/25/21 0800)   lactated ringers     lactated ringers 20 mL/hr at 12/25/21 0800   lactated ringers 20 mL/hr at 12/25/21 0800   phenylephrine (NEO-SYNEPHRINE) Adult infusion 40 mcg/min (12/25/21 0800)   promethazine (PHENERGAN) injection (IM or IVPB)     PRN Meds: sodium chloride, albumin human, dextrose, lactated ringers, metoprolol tartrate, midazolam, morphine injection, ondansetron (ZOFRAN) IV, oxyCODONE, promethazine (PHENERGAN) injection (IM or IVPB), sodium chloride flush, sodium chloride flush, traMADol   Vital Signs    Vitals:   12/25/21 0745 12/25/21 0800 12/25/21 0815 12/25/21 0830  BP:  106/63    Pulse: 68 66 64 66  Resp: 16 18 18 16   Temp: 99.1 F (37.3 C) 99.3 F (37.4 C)  99.3 F (37.4 C) 99.3 F (37.4 C)  TempSrc:  Bladder    SpO2: 95% 94% 95% 95%  Weight:      Height:        Intake/Output Summary (Last 24 hours) at 12/25/2021 0843 Last data filed at 12/25/2021 0800 Gross per 24 hour  Intake 4980.79 ml  Output 3930 ml  Net 1050.79 ml   Last 3 Weights 12/25/2021 12/24/2021 12/16/2021  Weight (lbs) 188 lb 9.6 oz 185 lb 187 lb 3.2 oz  Weight (kg) 85.548 kg 83.915 kg 84.913 kg      Telemetry    Sinus with frequent bigeminy - Personally Reviewed  ECG    NSR with HR 63 - Personally Reviewed  Physical Exam   GEN: Sitting up in a chair, NAD  Neck: No JVD Cardiac: RRR, no murmurs, rubs, or gallops. Sternotomy site with dressing in place Respiratory: Poor inspiratory effort but overall clear GI: Soft, nontender, non-distended  MS: No edema; No deformity. Neuro:  Nonfocal  Psych: Normal affect   Labs    High Sensitivity Troponin:  No results for input(s): TROPONINIHS in the last 720 hours.   Chemistry Recent Labs  Lab 12/24/21 0356 12/24/21 1154 12/24/21 1441 12/24/21 1445 12/24/21 2102 12/24/21 2109  12/24/21 2326 12/25/21 0309  NA 138   < > 137   < > 141 142 142 137  K 3.8   < > 4.4   < > 3.9 4.1 3.8 3.9  CL 103   < > 103  --  112*  --   --  108  CO2 27  --   --   --  24  --   --  24  GLUCOSE 138*   < > 119*  --  121*  --   --  118*  BUN 22   < > 21  --  18  --   --  18  CREATININE 0.99   < > 0.70  --  0.88  --   --  0.79  CALCIUM 9.6  --   --   --  8.1*  --   --  8.3*  MG  --   --   --   --  2.5*  --   --  2.1  GFRNONAA >60  --   --   --  >60  --   --  >60  ANIONGAP 8  --   --   --  5  --   --  5   < > = values in this interval not displayed.    Lipids No results for input(s): CHOL, TRIG, HDL, LABVLDL, LDLCALC, CHOLHDL in the last 168 hours.  Hematology Recent Labs  Lab 12/24/21 1600 12/24/21 1602 12/24/21 2102 12/24/21 2109 12/24/21 2326 12/25/21 0309  WBC 7.5  --  13.2*  --   --  11.9*  RBC 3.62*  --  3.79*  --    --  3.43*  HGB 11.0*   < > 11.3* 10.2* 10.2* 10.1*  HCT 31.5*   < > 33.1* 30.0* 30.0* 30.5*  MCV 87.0  --  87.3  --   --  88.9  MCH 30.4  --  29.8  --   --  29.4  MCHC 34.9  --  34.1  --   --  33.1  RDW 12.8  --  13.0  --   --  13.2  PLT 100*  --  141*  --   --  142*   < > = values in this interval not displayed.   Thyroid No results for input(s): TSH, FREET4 in the last 168 hours.  BNPNo results for input(s): BNP, PROBNP in the last 168 hours.  DDimer No results for input(s): DDIMER in the last 168 hours.   Radiology    DG Chest 2 View  Result Date: 12/23/2021 CLINICAL DATA:  Preop EXAM: CHEST - 2 VIEW COMPARISON:  04/25/2021 FINDINGS: Heart and mediastinal contours are within normal limits. No focal opacities or effusions. No acute bony abnormality. IMPRESSION: No active cardiopulmonary disease. Electronically Signed   By: Rolm Baptise M.D.   On: 12/23/2021 16:52   DG Chest Port 1 View  Result Date: 12/25/2021 CLINICAL DATA:  Chest soreness following recent CABG. EXAM: PORTABLE CHEST 1 VIEW COMPARISON:  Yesterday. FINDINGS: The endotracheal and nasogastric tubes have been removed. Stable right jugular catheter with its tip in the superior vena cava. Stable mildly enlarged cardiac silhouette. Decreased left basilar atelectasis without significant change in amount of right basilar atelectasis. Stable post CABG changes. No acute bony abnormality. IMPRESSION: Basilar atelectasis, decreased on the left. Electronically Signed   By: Claudie Revering M.D.   On: 12/25/2021 08:21   DG Chest Port 1 View  Result Date:  12/24/2021 CLINICAL DATA:  Post CABG EXAM: PORTABLE CHEST 1 VIEW COMPARISON:  12/23/2021 FINDINGS: Interval intubation, tip of the endotracheal tube is about 4.5 cm superior to the carina. Esophageal tube tip overlies the stomach, side-port in the region of GE junction. Right IJ catheter tip overlies the jugular/brachiocephalic confluence. Interval sternotomy changes with placement of  mediastinal and chest drainage catheters. The right lung is grossly clear. Subsegmental atelectasis and possible small left effusion. No visible pneumothorax. IMPRESSION: 1. Interim placement of support lines and tubes as above. Esophageal tube side-port in the region of GE junction and further advancement could be considered for more optimal positioning 2. Subsegmental atelectasis left base with probable trace left effusion Electronically Signed   By: Donavan Foil M.D.   On: 12/24/2021 16:32   ECHO INTRAOPERATIVE TEE  Result Date: 12/24/2021  *INTRAOPERATIVE TRANSESOPHAGEAL REPORT *  Patient Name:   Ronald Petersen Reeb Date of Exam: 12/24/2021 Medical Rec #:  [id number]        Height:       71.0 in Accession #:    [id number]       Weight:       185.0 lb Date of Birth:  11-27-1956        BSA:          2.04 m Patient Age:    68 years         BP:           157/74 mmHg Patient Gender: M                HR:           46 bpm. Exam Location:  Inpatient Transesophogeal exam was perform intraoperatively during surgical procedure. Patient was closely monitored under general anesthesia during the entirety of examination. Indications:     coronary artery bypass surgery Performing Phys: [id number] Lucile Crater LIGHTFOOT Diagnosing Phys: Nolon Nations MD Complications: No known complications during this procedure. POST-OP IMPRESSIONS _ Left Ventricle: The left ventricle is unchanged from pre-bypass. _ Right Ventricle: The right ventricle appears unchanged from pre-bypass. _ Aorta: The aorta appears unchanged from pre-bypass. _ Left Atrial Appendage: The left atrial appendage appears unchanged from pre-bypass. _ Aortic Valve: The aortic valve appears unchanged from pre-bypass. _ Mitral Valve: The mitral valve appears unchanged from pre-bypass. _ Tricuspid Valve: The tricuspid valve appears unchanged from pre-bypass. _ Pulmonic Valve: The pulmonic valve appears unchanged from pre-bypass. PRE-OP FINDINGS  Left Ventricle: The left  ventricle has normal systolic function, with an ejection fraction of 55-60%. The cavity size was mildly dilated. Right Ventricle: The right ventricle has normal systolic function. The cavity was normal. There is no increase in right ventricular wall thickness. Left Atrium: Left atrial size was dilated. No left atrial/left atrial appendage thrombus was detected. The left atrial appendage is well visualized and there is no evidence of thrombus present. Right Atrium: Right atrial size was normal in size. Interatrial Septum: No atrial level shunt detected by color flow Doppler. There is no evidence of a patent foramen ovale. Pericardium: There is no evidence of pericardial effusion. Mitral Valve: The mitral valve is normal in structure. Mitral valve regurgitation is mild by color flow Doppler. Tricuspid Valve: The tricuspid valve was normal in structure. Tricuspid valve regurgitation is mild by color flow Doppler. Aortic Valve: The aortic valve is tricuspid Aortic valve regurgitation is trivial by color flow Doppler. There is no stenosis of the aortic valve. Pulmonic Valve: The pulmonic valve was normal in structure.  Pulmonic valve regurgitation is trivial by color flow Doppler. Aorta: The ascending aorta and aortic root are normal in size and structure. There is evidence of plaque in the descending aorta; Grade II, measuring 2-71mm in size. Shunts: There is no evidence of an atrial septal defect. +-------------+------------++  AORTIC VALVE                 +-------------+------------++  AV Vmax:      206.00 cm/s    +-------------+------------++  AV Vmean:     124.000 cm/s   +-------------+------------++  AV VTI:       0.514 m        +-------------+------------++  AV Peak Grad: 17.0 mmHg      +-------------+------------++  AV Mean Grad: 8.0 mmHg       +-------------+------------++  Nolon Nations MD Electronically signed by Nolon Nations MD Signature Date/Time: 12/24/2021/5:45:24 PM    Final     Cardiac Studies   LHC  12/16/2021   Mid LM to Ost LAD lesion is 40% stenosed with 40% stenosed side branch in Ost Cx.   Ost LAD to Prox LAD lesion is 95% stenosed.   Prox LAD to Mid LAD DES Stent is widely patent   Prox Cx- 1st Mrg DES STENT is widely patent   1st Diag lesion is 25% stenosed.   RPAV lesion is 50% stenosed.   -----------------------------------   The left ventricular systolic function is normal.  The left ventricular ejection fraction is 55-65% by visual estimate.   LV end diastolic pressure is normal.   There is no aortic valve stenosis.     TTE 12/16/2021 IMPRESSIONS   1. Left ventricular ejection fraction, by estimation, is 60 to 65%. The left ventricle has normal function. The left ventricle has no regional wall motion abnormalities. There is moderate asymmetric left ventricular hypertrophy of the basal-septal  segment. Left ventricular diastolic parameters are indeterminate.   2. Right ventricular systolic function is normal. The right ventricular size is normal. There is mildly elevated pulmonary artery systolic pressure. The estimated right ventricular systolic pressure is 42.8 mmHg.   3. Left atrial size was mildly dilated.   4. The mitral valve is normal in structure. Trivial mitral valve  regurgitation. No evidence of mitral stenosis.   5. The aortic valve is tricuspid. Aortic valve regurgitation is trivial.  Mild aortic valve stenosis. Vmax 2.2 m/s, MG 57mmHg, AVA 2.0 cm^2, DI 0.54   6. The inferior vena cava is dilated in size with <50% respiratory  variability, suggesting right atrial pressure of 15 mmHg.    Patient Profile     65 y.o. male with history of HTN, DM, HLD, obesity, OSA, CAD s/p stent presented for an elective LHC found to have 95% ostial LAD stenosis now s/p LIMA-LAD and SVG-OM on 12/27.  Assessment & Plan    #Multivessel CAD with ostial LAD stenosis: S/p CABG with LIMA-LAD and SVG-OM on 12/27. Doing well post-op. -Post-op care per CV surgery -Continue ASA 325mg   daily -Continue crestor 40mg  daily -Will add GDMT with BB and ARB once able  #HTN: -Holding while post-op   #HLD: -Continue crestor 40mg  daily as above  #OSA: -On CPAP at home  #DMII: -ISS      For questions or updates, please contact [facility] Please consult [email or web address] for contact info under        Signed, Freada Bergeron, MD  12/25/2021, 8:43 AM

## 2021-12-25 NOTE — Anesthesia Postprocedure Evaluation (Signed)
Anesthesia Post Note  Patient: BRENNDEN MASTEN  Procedure(s) Performed: CORONARY ARTERY BYPASS GRAFTING (CABG), ON PUMP, TIMES TWO, USING LEFT INTERNAL MAMMARY ARTERY AND RIGHT ENDOSCOPICALLY HARVESTED GREATER SAPHENOUS VEIN (Chest) TRANSESOPHAGEAL ECHOCARDIOGRAM (TEE) (Esophagus) ENDOVEIN HARVEST OF GREATER SAPHENOUS VEIN (Right: Leg Upper)     Patient location during evaluation: PACU Anesthesia Type: General Level of consciousness: awake and alert Pain management: pain level controlled Vital Signs Assessment: post-procedure vital signs reviewed and stable Respiratory status: spontaneous breathing Cardiovascular status: stable Anesthetic complications: no   No notable events documented.  Last Vitals:  Vitals:   12/25/21 0830 12/25/21 0845  BP:    Pulse: 66 62  Resp: 16 19  Temp: 37.4 C 37.4 C  SpO2: 95% 95%    Last Pain:  Vitals:   12/25/21 0800  TempSrc: Bladder  PainSc: [place]

## 2021-12-26 ENCOUNTER — Inpatient Hospital Stay (HOSPITAL_COMMUNITY): Payer: Medicare HMO

## 2021-12-26 DIAGNOSIS — I10 Essential (primary) hypertension: Secondary | ICD-10-CM | POA: Diagnosis not present

## 2021-12-26 DIAGNOSIS — E1169 Type 2 diabetes mellitus with other specified complication: Secondary | ICD-10-CM | POA: Diagnosis not present

## 2021-12-26 DIAGNOSIS — Z951 Presence of aortocoronary bypass graft: Secondary | ICD-10-CM | POA: Diagnosis not present

## 2021-12-26 DIAGNOSIS — I25110 Atherosclerotic heart disease of native coronary artery with unstable angina pectoris: Secondary | ICD-10-CM | POA: Diagnosis not present

## 2021-12-26 LAB — GLUCOSE, CAPILLARY
Glucose-Capillary: 135 mg/dL — ABNORMAL HIGH (ref 70–99)
Glucose-Capillary: 126 mg/dL — ABNORMAL HIGH (ref 70–99)
Glucose-Capillary: 156 mg/dL — ABNORMAL HIGH (ref 70–99)
Glucose-Capillary: 111 mg/dL — ABNORMAL HIGH (ref 70–99)
Glucose-Capillary: 155 mg/dL — ABNORMAL HIGH (ref 70–99)
Glucose-Capillary: 124 mg/dL — ABNORMAL HIGH (ref 70–99)
Glucose-Capillary: 107 mg/dL — ABNORMAL HIGH (ref 70–99)

## 2021-12-26 LAB — BASIC METABOLIC PANEL
Anion gap: 6 (ref 5–15)
BUN: 23 mg/dL (ref 8–23)
CO2: 26 mmol/L (ref 22–32)
Calcium: 9 mg/dL (ref 8.9–10.3)
Chloride: 105 mmol/L (ref 98–111)
Creatinine, Ser: 0.79 mg/dL (ref 0.61–1.24)
GFR, Estimated: >60 mL/min (ref >60)
Glucose, Bld: 137 mg/dL — ABNORMAL HIGH (ref 70–99)
Potassium: 3.8 mmol/L (ref 3.5–5.1)
Sodium: 137 mmol/L (ref 135–145)

## 2021-12-26 LAB — CBC
HCT: 26.9 % — ABNORMAL LOW (ref 39.0–52.0)
Hemoglobin: 8.9 g/dL — ABNORMAL LOW (ref 13.0–17.0)
MCH: 29.8 pg (ref 26.0–34.0)
MCHC: 33.1 g/dL (ref 30.0–36.0)
MCV: 90 fL (ref 80.0–100.0)
Platelets: 121 10*3/uL — ABNORMAL LOW (ref 150–400)
RBC: 2.99 MIL/uL — ABNORMAL LOW (ref 4.22–5.81)
RDW: 13.5 % (ref 11.5–15.5)
WBC: 9.5 10*3/uL (ref 4.0–10.5)
nRBC: 0 % (ref 0.0–0.2)

## 2021-12-26 MED ORDER — SODIUM CHLORIDE 0.9 % IV SOLN: 250.0000 mL | INTRAVENOUS | Status: DC | PRN

## 2021-12-26 MED ORDER — TICAGRELOR 90 MG PO TABS
90.0000 mg | ORAL_TABLET | Freq: Two times a day (BID) | ORAL | Status: DC
  Administered 2021-12-26 – 2021-12-27 (×3): 90 mg via ORAL
  Filled 2021-12-26 (×3): qty 1

## 2021-12-26 MED ORDER — SODIUM CHLORIDE 0.9% FLUSH
3.0000 mL | Freq: Two times a day (BID) | INTRAVENOUS | Status: DC
  Administered 2021-12-26 – 2021-12-27 (×2): 3 mL via INTRAVENOUS

## 2021-12-26 MED ORDER — SODIUM CHLORIDE 0.9% FLUSH: 3.0000 mL | INTRAVENOUS | Status: DC | PRN

## 2021-12-26 MED ORDER — ASPIRIN 81 MG PO CHEW: 81.0000 mg | CHEWABLE_TABLET | Freq: Every day | ORAL | Status: DC

## 2021-12-26 MED ORDER — ASPIRIN EC 81 MG PO TBEC
81.0000 mg | DELAYED_RELEASE_TABLET | Freq: Every day | ORAL | Status: DC
  Administered 2021-12-27: 09:00:00 81 mg via ORAL
  Filled 2021-12-26: qty 1

## 2021-12-26 MED ORDER — ~~LOC~~ CARDIAC SURGERY, PATIENT & FAMILY EDUCATION: Freq: Once | Status: AC

## 2021-12-26 MED FILL — Sodium Bicarbonate IV Soln 8.4%: INTRAVENOUS | Qty: 50 | Status: AC

## 2021-12-26 MED FILL — Mannitol IV Soln 20%: INTRAVENOUS | Qty: 500 | Status: AC

## 2021-12-26 MED FILL — Sodium Chloride IV Soln 0.9%: INTRAVENOUS | Qty: 2000 | Status: AC

## 2021-12-26 MED FILL — Electrolyte-R (PH 7.4) Solution: INTRAVENOUS | Qty: 3000 | Status: AC

## 2021-12-26 MED FILL — Calcium Chloride Inj 10%: INTRAVENOUS | Qty: 10 | Status: AC

## 2021-12-26 MED FILL — Heparin Sodium (Porcine) Inj 1000 Unit/ML: INTRAMUSCULAR | Qty: 10 | Status: AC

## 2021-12-26 NOTE — Plan of Care (Signed)
°  Problem: Activity: Goal: Risk for activity intolerance will decrease Outcome: Progressing   Problem: Respiratory: Goal: Respiratory status will improve Outcome: Progressing   Problem: Skin Integrity: Goal: Risk for impaired skin integrity will decrease Outcome: Progressing

## 2021-12-26 NOTE — Progress Notes (Signed)
CARDIAC REHAB PHASE I   PRE:  Rate/Rhythm: 68 SR    BP: sitting 105/59    SaO2: 98 RA  MODE:  Ambulation: 370 ft   POST:  Rate/Rhythm: 70 SR with PVCs, at rest big/trig    BP: sitting 112/63     SaO2: 95 RA  Pt c/o severe pain but willing to walk. Needed verbal cues to shimmy hips forward on bed but stood mostly independently. Slow pace walking with eva. C/o neuropathy pain in feet. Used gait belt for light support. To recliner, pt able to eat his pudding. Went in to big/trig without sx.  [place], ACSM 12/26/2021 3:07 PM

## 2021-12-26 NOTE — Progress Notes (Signed)
Pt arrived to unit from 2 heart VSS, A/O x 4,  CCMD called ,CHG given, pt oriented to unit,Will continue to monitor.   Albin Felling Taytum Scheck, RN     12/26/21 1910  Vitals  Temp 99.7 F (37.6 C)  Temp Source Oral  BP 139/65  MAP (mmHg) 84  BP Location Right Arm  BP Method Automatic  Patient Position (if appropriate) Sitting  Pulse Rate 79  Pulse Rate Source Monitor  ECG Heart Rate 84  Resp 17  Level of Consciousness  Level of Consciousness Alert  Oxygen Therapy  SpO2 100 %  O2 Device Room Air  O2 Flow Rate (L/min) 0 L/min  MEWS Score  MEWS Temp 0  MEWS Systolic 0  MEWS Pulse 0  MEWS RR 0  MEWS LOC 0  MEWS Score 0  MEWS Score Color Nyoka Cowden

## 2021-12-26 NOTE — Progress Notes (Signed)
° °   °  [id number]       [place], [address]             [phone]                 2 Days Post-Op Procedure(s) (LRB): CORONARY ARTERY BYPASS GRAFTING (CABG), ON PUMP, TIMES TWO, USING LEFT INTERNAL MAMMARY ARTERY AND RIGHT ENDOSCOPICALLY HARVESTED GREATER SAPHENOUS VEIN (N/A) TRANSESOPHAGEAL ECHOCARDIOGRAM (TEE) (N/A) ENDOVEIN HARVEST OF GREATER SAPHENOUS VEIN (Right)   Events: No events Nausea improving _______________________________________________________________ Vitals: BP (!) 94/57    Pulse 65    Temp 98.5 F (36.9 C) (Oral)    Resp 12    Ht 5\' 11"  (1.803 m)    Wt 83.6 kg    SpO2 96%    BMI 25.69 kg/m  Filed Weights   12/24/21 1036 12/25/21 0422 12/26/21 0409  Weight: 83.9 kg 85.5 kg 83.6 kg     - Neuro: alert NAD  - Cardiovascular: sinus  Drips: none CVP:  [2 mmHg-3 mmHg] 3 mmHg  - Pulm: EWOB   ABG    Component Value Date/Time   PHART 7.269 (L) 12/24/2021 2326   PCO2ART 54.8 (H) 12/24/2021 2326   PO2ART 206 (H) 12/24/2021 2326   HCO3 24.9 12/24/2021 2326   TCO2 27 12/24/2021 2326   ACIDBASEDEF 2.0 12/24/2021 2326   O2SAT 100.0 12/24/2021 2326    - Abd: ND - Extremity: warm  .Intake/Output      12/28 0701 12/29 0700 12/29 0701 12/30 0700   P.O. 480    I.V. (mL/kg) 317.9 (3.8)    Blood     IV Piggyback 950.1    Total Intake(mL/kg) 1747.9 (20.9)    Urine (mL/kg/hr) 1290 (0.6)    Emesis/NG output 0    Stool     Chest Tube 140    Total Output 1430    Net +317.9         Emesis Occurrence 1 x       _______________________________________________________________ Labs: CBC Latest Ref Rng & Units 12/26/2021 12/25/2021 12/25/2021  WBC 4.0 - 10.5 K/uL 9.5 9.5 11.9(H)  Hemoglobin 13.0 - 17.0 g/dL 8.9(L) 9.3(L) 10.1(L)  Hematocrit 39.0 - 52.0 % 26.9(L) 28.5(L) 30.5(L)  Platelets 150 - 400 K/uL 121(L) 121(L) 142(L)   CMP Latest Ref Rng & Units 12/26/2021 12/25/2021 12/25/2021  Glucose 70 - 99 mg/dL 137(H) 170(H) 118(H)  BUN 8  - 23 mg/dL 23 20 18   Creatinine 0.61 - 1.24 mg/dL 0.79 0.93 0.79  Sodium 135 - 145 mmol/L 137 138 137  Potassium 3.5 - 5.1 mmol/L 3.8 4.0 3.9  Chloride 98 - 111 mmol/L 105 105 108  CO2 22 - 32 mmol/L 26 25 24   Calcium 8.9 - 10.3 mg/dL 9.0 9.1 8.3(L)  Total Protein 6.0 - 8.3 g/dL - - -  Total Bilirubin 0.2 - 1.2 mg/dL - - -  Alkaline Phos 39 - 117 U/L - - -  AST 0 - 37 U/L - - -  ALT 0 - 53 U/L - - -    CXR: PV congestion  _______________________________________________________________  Assessment and Plan: POD 2 s/p CABG  Neuro: pain controlled CV: On A/S/BB.  Will remove wires today Pulm: pulm hygiene.  Will remove chest tubes Renal: creat stable.  Auto diuresing GI: will follow nausea Heme: stable.  Adding home brilinta ID: [email or web address] Endo: SSI Dispo: floor   Lajuana Matte 12/26/2021 7:48 AM

## 2021-12-26 NOTE — Discharge Summary (Signed)
[address]       [place] [address]             [phone]    Physician Discharge Summary  Patient ID: RUCKER PRIDGEON MRN: [id number] DOB/AGE: 66-26-1957 66 y.o.  Admit date: 12/16/2021 Discharge date: 12/30/2021  Admission Diagnoses:  Patient Active Problem List   Diagnosis Date Noted   S/P CABG x 2 12/24/2021   Coronary artery disease involving native coronary artery of native heart with unstable angina pectoris ([place]) 12/16/2021   Chronic pain syndrome 06/07/2021   Mild neurocognitive disorder due to multiple etiologies 05/10/2021   Type II diabetes mellitus with hypoglycemia ([place]) 01/22/2021   Type 2 diabetes mellitus with diabetic polyneuropathy, without long-term current use of insulin ([place]) 52/77/8242   Metabolic syndrome 35/36/1443   Coronary artery disease 01/17/2021   Anxiety    Arthritis    Cancer ([place])    Complication of anesthesia    GERD (gastroesophageal reflux disease)    Hyperlipidemia    Hypertension    Neuromuscular disorder ([place])    Sleep apnea    Coronary artery disease involving native coronary artery of native heart with angina pectoris ([place]) 11/20/2020   CAD S/P DES PCI LAD & LCx-OM 11/20/2020   Unstable angina (HCC) 11/19/2020   Abnormal cardiac CT angiography 11/19/2020   Shortness of breath 10/02/2020   Dizziness 10/02/2020   Chest pain 10/02/2020   Obesity (BMI 30-39.9) 10/02/2020   Type 2 diabetes mellitus with diabetic neuropathy, without long-term current use of insulin ([place]) 06/06/2020   Uncontrolled type 2 diabetes mellitus with hyperglycemia ([place]) 05/31/2020   Blurry vision 02/07/2020   Spinal stenosis of lumbar region 02/07/2020   Frequent falls 02/07/2020   Foraminal stenosis of cervical region 07/26/2019   Aortic atherosclerosis ([place]) 05/27/2019   Chronic bilateral low back pain without sciatica 05/20/2019   Carpal tunnel syndrome, bilateral 05/20/2019   Leg weakness, bilateral 01/05/2019   Falling  episodes 01/05/2019   Right rotator cuff tear 11/23/2018   Neck pain 11/04/2018   Acute pain of right shoulder 11/04/2018   Gastroesophageal reflux disease 11/04/2018   Hyperlipidemia associated with type 2 diabetes mellitus ([place]) 11/04/2018   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 06/28/2018   Essential hypertension 09/14/2017   Preventative health care 06/12/2016   OSA (obstructive sleep apnea) 04/07/2016   Joint pain 04/05/2016   NSAID long-term use 12/13/2015   Nausea without vomiting 12/13/2015   History of colonic polyps 12/13/2015   Weight loss 12/13/2015   Depression 11/06/2015   Metatarsal deformity 02/20/2015   Equinus deformity of foot, acquired 02/20/2015   Plantar fasciitis, bilateral 02/15/2015   Left wrist injury 06/15/2014   Peripheral neuropathy 04/01/2012   Fatigue 11/05/2010   OTHER SPECIFIED DISORDER OF PENIS 03/26/2010   NAUSEA 03/26/2010   Pain in limb 10/17/2008   NECK PAIN, CHRONIC 11/24/2007   Diabetes mellitus, type II ([place]) 01/27/2007   Hyperlipidemia LDL goal <70 01/27/2007     Discharge Diagnoses:  Patient Active Problem List   Diagnosis Date Noted   S/P CABG x 2 12/24/2021   Coronary artery disease involving native coronary artery of native heart with unstable angina pectoris ([place]) 12/16/2021   Chronic pain syndrome 06/07/2021   Mild neurocognitive disorder due to multiple etiologies 05/10/2021   Type II diabetes mellitus with hypoglycemia ([place]) 01/22/2021   Type 2 diabetes mellitus with diabetic polyneuropathy, without long-term current use of insulin (  [place]) 68/11/7516   Metabolic syndrome 00/17/4944   Coronary artery disease 01/17/2021   Anxiety    Arthritis    Cancer ([place])    Complication of anesthesia    GERD (gastroesophageal reflux disease)    Hyperlipidemia    Hypertension    Neuromuscular disorder ([place])    Sleep apnea    Coronary artery disease involving native coronary artery of native heart with  angina pectoris ([place]) 11/20/2020   CAD S/P DES PCI LAD & LCx-OM 11/20/2020   Unstable angina (HCC) 11/19/2020   Abnormal cardiac CT angiography 11/19/2020   Shortness of breath 10/02/2020   Dizziness 10/02/2020   Chest pain 10/02/2020   Obesity (BMI 30-39.9) 10/02/2020   Type 2 diabetes mellitus with diabetic neuropathy, without long-term current use of insulin ([place]) 06/06/2020   Uncontrolled type 2 diabetes mellitus with hyperglycemia ([place]) 05/31/2020   Blurry vision 02/07/2020   Spinal stenosis of lumbar region 02/07/2020   Frequent falls 02/07/2020   Foraminal stenosis of cervical region 07/26/2019   Aortic atherosclerosis ([place]) 05/27/2019   Chronic bilateral low back pain without sciatica 05/20/2019   Carpal tunnel syndrome, bilateral 05/20/2019   Leg weakness, bilateral 01/05/2019   Falling episodes 01/05/2019   Right rotator cuff tear 11/23/2018   Neck pain 11/04/2018   Acute pain of right shoulder 11/04/2018   Gastroesophageal reflux disease 11/04/2018   Hyperlipidemia associated with type 2 diabetes mellitus ([place]) 11/04/2018   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 06/28/2018   Essential hypertension 09/14/2017   Preventative health care 06/12/2016   OSA (obstructive sleep apnea) 04/07/2016   Joint pain 04/05/2016   NSAID long-term use 12/13/2015   Nausea without vomiting 12/13/2015   History of colonic polyps 12/13/2015   Weight loss 12/13/2015   Depression 11/06/2015   Metatarsal deformity 02/20/2015   Equinus deformity of foot, acquired 02/20/2015   Plantar fasciitis, bilateral 02/15/2015   Left wrist injury 06/15/2014   Peripheral neuropathy 04/01/2012   Fatigue 11/05/2010   OTHER SPECIFIED DISORDER OF PENIS 03/26/2010   NAUSEA 03/26/2010   Pain in limb 10/17/2008   NECK PAIN, CHRONIC 11/24/2007   Diabetes mellitus, type II ([place]) 01/27/2007   Hyperlipidemia LDL goal <70 01/27/2007     Discharged Condition: good    History of  Present Illness:     This is a 65 year old male with a past medical history of coronary artery disease (previous DES stents x 2), hypertension, hyperlipidemia, diabetes mellitus, neuropathy, OSA, remote tobacco abuse who has been having intermittent episodes of chest pain with exertion. Associated with this was nausea. He denies shortness of breath, diaphoresis, syncope. Per patient, he thought it might be his "reflux acting up" so he took extra Tums. These did not help so he took a Nitro. Of note, he needs cervical fusion C5-6 and C6-7. He was in the process of getting cardiology clearance for this when he experienced intermittent, worsening chest pain. He presented to [facility] on 12/16/2021 in order to undergo a cardiac catheterization (via right radial artery). Cardiac catheterization results showed 40% left main stenosis, ostial to proximal LAD with a 95% stenosis, proximal LAD to mid LAD DES stent is widely patent, proximal Circumflex to OM1 DES stent is widely patent, and RPAV lesion is 50% stenosed. Echo ordered; await results. Dr. Kipp Brood has been consulted for consideration of coronary artery bypass grafting surgery.  He reviewed the patient and all relevant studies and recommended proceeding with coronary artery surgical  revascularization.  He remained in the hospital for Brilinta washout with heparin bridge and remained stable.  Hospital course:  Following cardiac catheterization on 12/16/2021 he remained in the hospital and surgery was scheduled for 12/24/2021.  Patient was taken to the operating room and tolerated the procedure well: CABG x2 with a left internal mammary artery graft to the LAD and a saphenous vein graft to the obtuse marginal.  He tolerated the procedure well was taken to the surgical ICU in stable condition.   Postoperative hospital course:  The patient was weaned from the ventilator using standard post cardiac surgical protocols without difficulty.  He has remained  hemodynamically stable and neowas weaned off.  He has a mild expected acute blood loss anemia.  Renal function has remained within normal limits.  He has had some early postoperative nausea which is responding to the usual therapies.  All routine lines, monitors and drainage devices have been discontinued in standard fashion.  On postop day #2 his home Brilinta was resumed.  He was transferred to 4 E. telemetry unit for ongoing cardiac rehab and telemetry observation.  Brilinta was subsequently continued and he was kept on aspirin for antiplatelet therapy.  He has a history of Plavix resistance so is not felt to be an option.  This is tested for as an outpatient.  Blood pressure is felt to be close to initiate ARB/ACE inhibitor therapy but it may be able to be started as an outpatient depending on hypotension status.  His anemia has stabilized.  Thrombocytopenia has shown an improving trend.  Blood sugars have been under adequate control and cardiology has started him on Jardiance and therefore preoperative metformin was not resumed.  Incisions are all noted to be healing well without evidence of infection.  He was weaned off oxygen.  His status at the time of discharge was felt to be quite stable.  Consults: cardiology  Significant Diagnostic Studies:  DG Chest 2 View  Result Date: 12/23/2021 CLINICAL DATA:  Preop EXAM: CHEST - 2 VIEW COMPARISON:  04/25/2021 FINDINGS: Heart and mediastinal contours are within normal limits. No focal opacities or effusions. No acute bony abnormality. IMPRESSION: No active cardiopulmonary disease. Electronically Signed   By: Rolm Baptise M.D.   On: 12/23/2021 16:52   CARDIAC CATHETERIZATION  Result Date: 12/16/2021   Mid LM to Ost LAD lesion is 40% stenosed with 40% stenosed side branch in Ost Cx.   Ost LAD to Prox LAD lesion is 95% stenosed.   Prox LAD to Mid LAD DES Stent is widely patent   Prox Cx- 1st Mrg DES STENT is widely patent   1st Diag lesion is 25% stenosed.    RPAV lesion is 50% stenosed.   -----------------------------------   The left ventricular systolic function is normal.  The left ventricular ejection fraction is 55-65% by visual estimate.   LV end diastolic pressure is normal.   There is no aortic valve stenosis. 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 ->  Location of the stenosis and involvement of left main makes PCI of the LAD suboptimal especially in the patient who has pending surgery.  Would not be willing to stop antiplatelet agent after 3 to 6 months in the setting of Left Main Stenting. Only minimal RCA disease as well as downstream LCx and LAD disease. Preserved LVEF normal EDP. RECOMMENDATION Based on significance of ostial left main  disease and level of his symptoms, I feel it is safer to admit him to the hospital for CVTS consultation.  We will start IV heparin. Titrate home dose of statin Restart BP meds Sliding scale insulin Glenetta Hew, MD  DG Chest Port 1 View  Result Date: 12/26/2021 CLINICAL DATA:  Chest tube present EXAM: PORTABLE CHEST 1 VIEW COMPARISON:  12/25/2021 FINDINGS: Unchanged right IJ central line and left chest tube. Patchy bibasilar atelectasis. Probable trace pleural effusions. No pneumothorax. Similar cardiomediastinal contours. IMPRESSION: Persistent patchy bibasilar atelectasis and probable trace pleural effusions. No pneumothorax. Electronically Signed   By: Macy Mis M.D.   On: 12/26/2021 08:47   DG Chest Port 1 View  Result Date: 12/25/2021 CLINICAL DATA:  Chest soreness following recent CABG. EXAM: PORTABLE CHEST 1 VIEW COMPARISON:  Yesterday. FINDINGS: The endotracheal and nasogastric tubes have been removed. Stable right jugular catheter with its tip in the superior vena cava. Stable mildly enlarged cardiac silhouette. Decreased left basilar atelectasis without significant change in  amount of right basilar atelectasis. Stable post CABG changes. No acute bony abnormality. IMPRESSION: Basilar atelectasis, decreased on the left. Electronically Signed   By: Claudie Revering M.D.   On: 12/25/2021 08:21   DG Chest Port 1 View  Result Date: 12/24/2021 CLINICAL DATA:  Post CABG EXAM: PORTABLE CHEST 1 VIEW COMPARISON:  12/23/2021 FINDINGS: Interval intubation, tip of the endotracheal tube is about 4.5 cm superior to the carina. Esophageal tube tip overlies the stomach, side-port in the region of GE junction. Right IJ catheter tip overlies the jugular/brachiocephalic confluence. Interval sternotomy changes with placement of mediastinal and chest drainage catheters. The right lung is grossly clear. Subsegmental atelectasis and possible small left effusion. No visible pneumothorax. IMPRESSION: 1. Interim placement of support lines and tubes as above. Esophageal tube side-port in the region of GE junction and further advancement could be considered for more optimal positioning 2. Subsegmental atelectasis left base with probable trace left effusion Electronically Signed   By: Donavan Foil M.D.   On: 12/24/2021 16:32   ECHOCARDIOGRAM COMPLETE  Result Date: 12/16/2021    ECHOCARDIOGRAM REPORT   Patient Name:   WAH SABIC Eisenhour Date of Exam: 12/16/2021 Medical Rec #:  [id number]        Height:       71.0 in Accession #:    [id number]       Weight:       185.0 lb Date of Birth:  [birth year]        BSA:          2.040 m Patient Age:    35 years         BP:           131/70 mmHg Patient Gender: M                HR:           60 bpm. Exam Location:  Inpatient Procedure: 2D Echo, Color Doppler and Cardiac Doppler Indications:    CAD  History:        Patient has prior history of Echocardiogram examinations. CAD;                 Risk Factors:Hypertension and Diabetes.  Sonographer:    Jyl Heinz Referring Phys: [id number] HARRELL O LIGHTFOOT IMPRESSIONS  1. Left ventricular ejection fraction, by estimation, is 60  to 65%. The left ventricle has normal function. The left ventricle has no regional wall motion abnormalities. There is moderate  asymmetric left ventricular hypertrophy of the basal-septal segment. Left ventricular diastolic parameters are indeterminate.  2. Right ventricular systolic function is normal. The right ventricular size is normal. There is mildly elevated pulmonary artery systolic pressure. The estimated right ventricular systolic pressure is 98.9 mmHg.  3. Left atrial size was mildly dilated.  4. The mitral valve is normal in structure. Trivial mitral valve regurgitation. No evidence of mitral stenosis.  5. The aortic valve is tricuspid. Aortic valve regurgitation is trivial. Mild aortic valve stenosis. Vmax 2.2 m/s, MG 71mmHg, AVA 2.0 cm^2, DI 0.54  6. The inferior vena cava is dilated in size with <50% respiratory variability, suggesting right atrial pressure of 15 mmHg. FINDINGS  Left Ventricle: Left ventricular ejection fraction, by estimation, is 60 to 65%. The left ventricle has normal function. The left ventricle has no regional wall motion abnormalities. The left ventricular internal cavity size was normal in size. There is  moderate asymmetric left ventricular hypertrophy of the basal-septal segment. Left ventricular diastolic parameters are indeterminate. Right Ventricle: The right ventricular size is normal. No increase in right ventricular wall thickness. Right ventricular systolic function is normal. There is mildly elevated pulmonary artery systolic pressure. The tricuspid regurgitant velocity is 2.35  m/s, and with an assumed right atrial pressure of 15 mmHg, the estimated right ventricular systolic pressure is 21.1 mmHg. Left Atrium: Left atrial size was mildly dilated. Right Atrium: Right atrial size was normal in size. Pericardium: There is no evidence of pericardial effusion. Mitral Valve: The mitral valve is normal in structure. Trivial mitral valve regurgitation. No evidence of mitral  valve stenosis. Tricuspid Valve: The tricuspid valve is normal in structure. Tricuspid valve regurgitation is trivial. Aortic Valve: The aortic valve is tricuspid. Aortic valve regurgitation is trivial. Mild aortic stenosis is present. Aortic valve mean gradient measures 10.0 mmHg. Aortic valve peak gradient measures 18.1 mmHg. Aortic valve area, by VTI measures 2.28 cm. Pulmonic Valve: The pulmonic valve was not well visualized. Pulmonic valve regurgitation is trivial. Aorta: The aortic root and ascending aorta are structurally normal, with no evidence of dilitation. Venous: The inferior vena cava is dilated in size with less than 50% respiratory variability, suggesting right atrial pressure of 15 mmHg. IAS/Shunts: The interatrial septum was not well visualized.  LEFT VENTRICLE PLAX 2D LVIDd:         5.40 cm      Diastology LVIDs:         3.10 cm      LV e' medial:    5.98 cm/s LV PW:         1.00 cm      LV E/e' medial:  13.9 LV IVS:        1.40 cm      LV e' lateral:   8.38 cm/s LVOT diam:     2.20 cm      LV E/e' lateral: 9.9 LV SV:         112 LV SV Index:   55 LVOT Area:     3.80 cm  LV Volumes (MOD) LV vol d, MOD A2C: 138.0 ml LV vol d, MOD A4C: 145.0 ml LV vol s, MOD A2C: 57.7 ml LV vol s, MOD A4C: 55.0 ml LV SV MOD A2C:     80.3 ml LV SV MOD A4C:     145.0 ml LV SV MOD BP:      93.8 ml RIGHT VENTRICLE             IVC RV  Basal diam:  4.50 cm     IVC diam: 2.20 cm RV Mid diam:    2.90 cm RV S prime:     16.00 cm/s TAPSE (M-mode): 4.1 cm LEFT ATRIUM             Index        RIGHT ATRIUM           Index LA diam:        3.90 cm 1.91 cm/m   RA Area:     17.70 cm LA Vol (A2C):   69.9 ml 34.27 ml/m  RA Volume:   46.50 ml  22.80 ml/m LA Vol (A4C):   69.8 ml 34.22 ml/m LA Biplane Vol: 70.9 ml 34.76 ml/m  AORTIC VALVE AV Area (Vmax):    2.21 cm AV Area (Vmean):   2.26 cm AV Area (VTI):     2.28 cm AV Vmax:           212.67 cm/s AV Vmean:          151.667 cm/s AV VTI:            0.492 m AV Peak Grad:       18.1 mmHg AV Mean Grad:      10.0 mmHg LVOT Vmax:         123.50 cm/s LVOT Vmean:        90.350 cm/s LVOT VTI:          0.294 m LVOT/AV VTI ratio: 0.60  AORTA Ao Root diam: 3.50 cm Ao Asc diam:  3.20 cm MITRAL VALVE               TRICUSPID VALVE MV Area (PHT): 4.71 cm    TR Peak grad:   22.1 mmHg MV Decel Time: 161 msec    TR Vmax:        235.00 cm/s MV E velocity: 83.20 cm/s MV A velocity: 77.10 cm/s  SHUNTS MV E/A ratio:  1.08        Systemic VTI:  0.29 m                            Systemic Diam: 2.20 cm Oswaldo Milian MD Electronically signed by Oswaldo Milian MD Signature Date/Time: 12/16/2021/2:26:11 PM    Final    ECHO INTRAOPERATIVE TEE  Result Date: 12/24/2021  *INTRAOPERATIVE TRANSESOPHAGEAL REPORT *  Patient Name:   CORNEL WERBER Eagon Date of Exam: 12/24/2021 Medical Rec #:  [id number]        Height:       71.0 in Accession #:    [id number]       Weight:       185.0 lb Date of Birth:  10-09-1956        BSA:          2.04 m Patient Age:    73 years         BP:           157/74 mmHg Patient Gender: M                HR:           46 bpm. Exam Location:  Inpatient Transesophogeal exam was perform intraoperatively during surgical procedure. Patient was closely monitored under general anesthesia during the entirety of examination. Indications:     coronary artery bypass surgery Performing Phys: [id number] Lucile Crater LIGHTFOOT Diagnosing Phys: Nolon Nations MD Complications: No known complications during  this procedure. POST-OP IMPRESSIONS _ Left Ventricle: The left ventricle is unchanged from pre-bypass. _ Right Ventricle: The right ventricle appears unchanged from pre-bypass. _ Aorta: The aorta appears unchanged from pre-bypass. _ Left Atrial Appendage: The left atrial appendage appears unchanged from pre-bypass. _ Aortic Valve: The aortic valve appears unchanged from pre-bypass. _ Mitral Valve: The mitral valve appears unchanged from pre-bypass. _ Tricuspid Valve: The tricuspid valve appears  unchanged from pre-bypass. _ Pulmonic Valve: The pulmonic valve appears unchanged from pre-bypass. PRE-OP FINDINGS  Left Ventricle: The left ventricle has normal systolic function, with an ejection fraction of 55-60%. The cavity size was mildly dilated. Right Ventricle: The right ventricle has normal systolic function. The cavity was normal. There is no increase in right ventricular wall thickness. Left Atrium: Left atrial size was dilated. No left atrial/left atrial appendage thrombus was detected. The left atrial appendage is well visualized and there is no evidence of thrombus present. Right Atrium: Right atrial size was normal in size. Interatrial Septum: No atrial level shunt detected by color flow Doppler. There is no evidence of a patent foramen ovale. Pericardium: There is no evidence of pericardial effusion. Mitral Valve: The mitral valve is normal in structure. Mitral valve regurgitation is mild by color flow Doppler. Tricuspid Valve: The tricuspid valve was normal in structure. Tricuspid valve regurgitation is mild by color flow Doppler. Aortic Valve: The aortic valve is tricuspid Aortic valve regurgitation is trivial by color flow Doppler. There is no stenosis of the aortic valve. Pulmonic Valve: The pulmonic valve was normal in structure. Pulmonic valve regurgitation is trivial by color flow Doppler. Aorta: The ascending aorta and aortic root are normal in size and structure. There is evidence of plaque in the descending aorta; Grade II, measuring 2-72mm in size. Shunts: There is no evidence of an atrial septal defect. +-------------+------------++  AORTIC VALVE                 +-------------+------------++  AV Vmax:      206.00 cm/s    +-------------+------------++  AV Vmean:     124.000 cm/s   +-------------+------------++  AV VTI:       0.514 m        +-------------+------------++  AV Peak Grad: 17.0 mmHg      +-------------+------------++  AV Mean Grad: 8.0 mmHg       +-------------+------------++   Nolon Nations MD Electronically signed by Nolon Nations MD Signature Date/Time: 12/24/2021/5:45:24 PM    Final    VAS US DOPPLER PRE CABG  Result Date: 12/20/2021 PREOPERATIVE VASCULAR EVALUATION Patient Name:  SHOURYA MACPHERSON  Date of Exam:   12/20/2021 Medical Rec #: [id number]         Accession #:    [id number] Date of Birth: 07/02/1956         Patient Gender: M Patient Age:   18 years Exam Location:  [facility] Procedure:      VAS US DOPPLER PRE CABG Referring Phys: HARRELL LIGHTFOOT --------------------------------------------------------------------------------  Indications:      Pre-CABG. Risk Factors:     Hypertension, hyperlipidemia, Diabetes, past history of                   smoking, coronary artery disease. Comparison Study: No prior studies. Performing Technologist: Darlin Coco RDMS RVT  Examination Guidelines: A complete evaluation includes B-mode imaging, spectral Doppler, color Doppler, and power Doppler as needed of all accessible portions of each vessel. Bilateral testing is considered an integral part of a  complete examination. Limited examinations for reoccurring indications may be performed as noted.  Right Carotid Findings: +----------+--------+--------+--------+--------+------------------+             PSV cm/s EDV cm/s Stenosis Describe Comments            +----------+--------+--------+--------+--------+------------------+  CCA Prox   94       13                                             +----------+--------+--------+--------+--------+------------------+  CCA Distal 62       12                         intimal thickening  +----------+--------+--------+--------+--------+------------------+  ICA Prox   71       30                                             +----------+--------+--------+--------+--------+------------------+  ICA Distal 109      39                                             +----------+--------+--------+--------+--------+------------------+  ECA        73        9                                              +----------+--------+--------+--------+--------+------------------+ +----------+--------+-------+----------------+------------+             PSV cm/s EDV cms Describe         Arm Pressure  +----------+--------+-------+----------------+------------+  Subclavian 92               Multiphasic, WNL               +----------+--------+-------+----------------+------------+ +---------+--------+--+--------+--+---------+  Vertebral PSV cm/s 81 EDV cm/s 22 Antegrade  +---------+--------+--+--------+--+---------+ Left Carotid Findings: +----------+--------+--------+--------+---------------------+------------------+             PSV cm/s EDV cm/s Stenosis Describe              Comments            +----------+--------+--------+--------+---------------------+------------------+  CCA Prox   87       22                                                          +----------+--------+--------+--------+---------------------+------------------+  CCA Distal 72       16                                      intimal thickening  +----------+--------+--------+--------+---------------------+------------------+  ICA Prox   95       37       1-39%    hyperechoic and  smooth                                    +----------+--------+--------+--------+---------------------+------------------+  ICA Distal 103      42                                                          +----------+--------+--------+--------+---------------------+------------------+  ECA        100      14                                                          +----------+--------+--------+--------+---------------------+------------------+ +----------+--------+--------+----------------+------------+  Subclavian PSV cm/s EDV cm/s Describe         Arm Pressure  +----------+--------+--------+----------------+------------+             82                Multiphasic, WNL                +----------+--------+--------+----------------+------------+ +---------+--------+--+--------+--+---------+  Vertebral PSV cm/s 44 EDV cm/s 14 Antegrade  +---------+--------+--+--------+--+---------+  ABI Findings: +---------+------------------+-----+---------+--------+  Right     Rt Pressure (mmHg) Index Waveform  Comment   +---------+------------------+-----+---------+--------+  Brachial  128                      triphasic           +---------+------------------+-----+---------+--------+  PTA       140                1.09  triphasic           +---------+------------------+-----+---------+--------+  DP        132                1.03  triphasic           +---------+------------------+-----+---------+--------+  Great Toe 95                 0.74  Normal              +---------+------------------+-----+---------+--------+ +---------+------------------+-----+---------+-------+  Left      Lt Pressure (mmHg) Index Waveform  Comment  +---------+------------------+-----+---------+-------+  Brachial  126                      triphasic          +---------+------------------+-----+---------+-------+  PTA       134                1.05  triphasic          +---------+------------------+-----+---------+-------+  DP        116                0.91  triphasic          +---------+------------------+-----+---------+-------+  Great Toe 109                0.85  Normal             +---------+------------------+-----+---------+-------+  Right Doppler Findings: +--------+--------+-----+---------+--------+  Site  Pressure Index Doppler   Comments  +--------+--------+-----+---------+--------+  Brachial 128            triphasic           +--------+--------+-----+---------+--------+  Radial                  triphasic           +--------+--------+-----+---------+--------+  Ulnar                   triphasic           +--------+--------+-----+---------+--------+  Left Doppler Findings: +--------+--------+-----+---------+--------+  Site      Pressure Index Doppler   Comments  +--------+--------+-----+---------+--------+  Brachial 126            triphasic           +--------+--------+-----+---------+--------+  Radial                  triphasic           +--------+--------+-----+---------+--------+  Ulnar                   triphasic           +--------+--------+-----+---------+--------+  Summary: Right Carotid: The extracranial vessels were near-normal with only minimal wall                thickening or plaque. Left Carotid: Velocities in the left ICA are consistent with a 1-39% stenosis. Vertebrals:  Bilateral vertebral arteries demonstrate antegrade flow. Subclavians: Normal flow hemodynamics were seen in bilateral subclavian              arteries. Right ABI: Resting right ankle-brachial index is within normal range. No evidence of significant right lower extremity arterial disease. The right toe-brachial index is normal. Left ABI: Resting left ankle-brachial index is within normal range. No evidence of significant left lower extremity arterial disease. The left toe-brachial index is normal. Right Upper Extremity: Doppler waveforms remain within normal limits with right radial compression. Doppler waveforms remain within normal limits with right ulnar compression. Left Upper Extremity: Doppler waveforms remain within normal limits with left radial compression. Doppler waveforms remain within normal limits with left ulnar compression.  Electronically signed by Jamelle Haring on 12/20/2021 at 2:42:33 PM.    Final      Treatments: surgery:  12/24/2021 Patient:  [name] Pre-Op Dx: Coronary artery disease                         Exertional angina                         Hypertension                         Diabetes mellitus                         Hyperlipidemia Post-op Dx: Same Procedure: CABG X 2.  LIMA to LAD.  Reverse saphenous vein graft to obtuse marginal Endoscopic greater saphenous vein harvest on the right     Surgeon and  Role:      * Lightfoot, Lucile Crater, MD - [place] An experienced assistant was required given the complexity of this surgery and the standard of surgical care. The assistant was needed for exposure, dissection, suctioning, retraction of delicate tissues and sutures, instrument exchange and  for overall help during this procedure.     Anesthesia  general EBL: 500 ml Blood Administration: None Xclamp Time: 34 min Pump Time: 60 min  Discharge Exam: Blood pressure 98/63, pulse 76, temperature 98.8 F (37.1 C), temperature source Oral, resp. rate 16, height 5\' 11"  (1.803 m), weight 82.4 kg, SpO2 96 %.   General appearance: alert, cooperative, and no distress Heart: regular rate and rhythm Lungs: clear to auscultation bilaterally Abdomen: benign Extremities: no edema Wound: healing well, right thigh echymosis is stable  Discharge Medications:  The patient has been discharged on:   1.Beta Blocker:  Yes [ y  ]                              No   [   ]                              If No, reason:  2.Ace Inhibitor/ARB: Yes [   ]                                     No  [  n  ]                                     If No, reason:labile BP  3.Statin:   Yes Blue.Reese   ]                  No  [   ]                  If No, reason:  4.Ecasa:  Yes  [ y  ]                  No   [   ]                  If No, reason:  Patient had ACS upon admission:  Plavix/P2Y12 inhibitor: Yes [   ]                                      No  [  n ]     Discharge Instructions     Amb Referral to Cardiac Rehabilitation   Complete by: As directed    Diagnosis: CABG   CABG X ___: 2   After initial evaluation and assessments completed: Virtual Based Care may be provided alone or in conjunction with Phase 2 Cardiac Rehab based on patient barriers.: Yes   Discharge patient   Complete by: As directed    Discharge disposition: 01-Home or Self Care   Discharge patient date: 12/28/2021       Allergies as of 12/28/2021       Reactions   Niacin Anaphylaxis        Medication List     STOP taking these medications    amLODipine 2.5 MG tablet Commonly known as: NORVASC   B-12 2500 MCG Tabs   Brilinta 90 MG Tabs tablet Generic drug: ticagrelor   furosemide 40 MG tablet Commonly known as: LASIX   lisinopril 5 MG tablet Commonly known as: ZESTRIL  metFORMIN 500 MG 24 hr tablet Commonly known as: GLUCOPHAGE-XR   nitroGLYCERIN 0.4 MG SL tablet Commonly known as: NITROSTAT   oxyCODONE-acetaminophen 5-325 MG tablet Commonly known as: PERCOCET/ROXICET   potassium chloride SA 20 MEQ tablet Commonly known as: KLOR-CON M   ranolazine 1000 MG SR tablet Commonly known as: RANEXA   ticagrelor 90 MG Tabs tablet Commonly known as: Brilinta       TAKE these medications    acetaminophen 650 MG CR tablet Commonly known as: TYLENOL Take 1,300 mg by mouth every 8 (eight) hours as needed for pain.   aspirin 325 MG EC tablet Take 1 tablet (325 mg total) by mouth daily. What changed:  medication strength how much to take additional instructions   dicyclomine 10 MG capsule Commonly known as: BENTYL Take 1 capsule (10 mg total) by mouth every 8 (eight) hours as needed for spasms.   ELDERBERRY PO Take 300 mg by mouth daily.   empagliflozin 10 MG Tabs tablet Commonly known as: JARDIANCE Take 1 tablet (10 mg total) by mouth daily.   fenofibrate 160 MG tablet TAKE 1 TABLET (160 MG TOTAL) BY MOUTH DAILY.   ferrous JIRCVELF-Y10-[id number] C-folic acid capsule Commonly known as: TRINSICON / FOLTRIN Take 1 capsule by mouth 2 (two) times daily after a meal.   FreeStyle Libre 2 Reader [employer] Use as directed to check blood sugar   FreeStyle Libre 2 Sensor Misc Use to check blood sugar, change every 14 days   gabapentin 800 MG tablet Commonly known as: Neurontin Take 1 tablet (800 mg total) by mouth 3 (three) times daily.   Magnesium Citrate 200 MG  Tabs Take 400 mg by mouth daily.   metoprolol tartrate 25 MG tablet Commonly known as: LOPRESSOR Take 1/2 tablet (12.5 mg total) by mouth 2 (two) times daily.   ondansetron 4 MG tablet Commonly known as: ZOFRAN Take 1 tablet (4 mg total) by mouth every 8 (eight) hours as needed.   OneTouch Verio test strip Generic drug: glucose blood USE AS INSTRUCTED TO CHECK BLOOD SUGAR ONCE A DAY   oxyCODONE 5 MG immediate release tablet Commonly known as: Oxy IR/ROXICODONE Take 1 tablet (5 mg total) by mouth every 6 (six) hours as needed for up to 7 days for severe pain.   pantoprazole 40 MG tablet Commonly known as: PROTONIX TAKE 1 TABLET (40 MG TOTAL) BY  MOUTH ONCE A DAY What changed: additional instructions   polyethylene glycol powder 17 GM/SCOOP powder Commonly known as: GLYCOLAX/MIRALAX Take 17 g by mouth 2 (two) times daily as needed for moderate constipation.   rosuvastatin 40 MG tablet Commonly known as: CRESTOR TAKE 1 TABLET BY MOUTH ONCE DAILY   sertraline 100 MG tablet Commonly known as: ZOLOFT TAKE ONE AND ONE-HALF TABLET BY MOUTH ONCE DAILY   tiZANidine 4 MG tablet Commonly known as: ZANAFLEX TAKE 1 TABLET (4 MG TOTAL) BY MOUTH EVERY 6 (SIX) HOURS AS NEEDED FOR MUSCLE SPASMS.   traZODone 50 MG tablet Commonly known as: DESYREL Take 1 tablet (50 mg total) by mouth at bedtime.   vitamin E 180 MG (400 UNITS) capsule Take 1,600 Units by mouth daily.        Follow-up Information     Lightfoot, Lucile Crater, MD Follow up.   Specialty: Cardiothoracic Surgery Why: Please see discharge paperwork for follow-up appointment.  The surgeon will call you in 1 week and future appointments will be in person. Contact information: [address] [place] [address]  Taft, David W, MD Follow up.   Specialty: Cardiology Why: Please see discharge paperwork for follow-up appointment with cardiology in 2 weeks. Contact information: [address] [place] [address] [phone]                 Signed: John Giovanni, PA-C  12/30/2021, 8:51 AM

## 2021-12-26 NOTE — Progress Notes (Signed)
Progress Note  Patient Name: Ronald Petersen Date of Encounter: 12/26/2021  [facility] Cardiologist: Berniece Salines, DO   Subjective   Feels better today. Nausea improved. Still pain with deep inspiration  Inpatient Medications    Scheduled Meds:  acetaminophen  1,000 mg Oral Q6H   Or   acetaminophen (TYLENOL) oral liquid 160 mg/5 mL  1,000 mg Per Tube Q6H   aspirin EC  325 mg Oral Daily   Or   aspirin  324 mg Per Tube Daily   bisacodyl  10 mg Oral Daily   Or   bisacodyl  10 mg Rectal Daily   Chlorhexidine Gluconate Cloth  6 each Topical Daily   docusate sodium  200 mg Oral Daily   insulin aspart  0-24 Units Subcutaneous Q4H   metoprolol tartrate  12.5 mg Oral BID   pantoprazole  40 mg Oral Daily   rosuvastatin  40 mg Oral Daily   sertraline  150 mg Oral Daily   sodium chloride flush  10-40 mL Intracatheter Q12H   sodium chloride flush  3 mL Intravenous Q12H   Continuous Infusions:  sodium chloride Stopped (12/25/21 1555)   sodium chloride     sodium chloride     insulin Stopped (12/25/21 0922)   lactated ringers     lactated ringers Stopped (12/25/21 0926)   lactated ringers Stopped (12/25/21 0923)   phenylephrine (NEO-SYNEPHRINE) Adult infusion Stopped (12/25/21 0847)   promethazine (PHENERGAN) injection (IM or IVPB) 200 mL/hr at 12/25/21 2154   PRN Meds: sodium chloride, dextrose, lactated ringers, metoCLOPramide (REGLAN) injection, metoprolol tartrate, midazolam, morphine injection, ondansetron (ZOFRAN) IV, oxyCODONE, promethazine (PHENERGAN) injection (IM or IVPB), sodium chloride flush, sodium chloride flush, traMADol   Vital Signs    Vitals:   12/26/21 0352 12/26/21 0400 12/26/21 0409 12/26/21 0500  BP:  110/63  109/63  Pulse:  65  68  Resp:  15  13  Temp: 98.5 F (36.9 C)     TempSrc: Oral     SpO2:  97%  95%  Weight:   83.6 kg   Height:        Intake/Output Summary (Last 24 hours) at 12/26/2021 0612 Last data filed at 12/26/2021  0200 Gross per 24 hour  Intake 1640.72 ml  Output 1520 ml  Net 120.72 ml    Last 3 Weights 12/26/2021 12/25/2021 12/24/2021  Weight (lbs) 184 lb 3.2 oz 188 lb 9.6 oz 185 lb  Weight (kg) 83.553 kg 85.548 kg 83.915 kg      Telemetry    Sinus, frequent PVCs, frequent bigeminy - Personally Reviewed  ECG    No new tracing - Personally Reviewed  Physical Exam   GEN: Sitting in bed, comfortable Neck: No JVD Cardiac: RRR, no murmurs, rubs, or gallops. Sternotomy site with dressing in place Respiratory: Poor inspiratory effort but overall clear GI: Soft, nontender, non-distended  MS: Trace LE edema, warm Neuro:  Nonfocal  Psych: Normal affect   Labs    High Sensitivity Troponin:  No results for input(s): TROPONINIHS in the last 720 hours.   Chemistry Recent Labs  Lab 12/24/21 2102 12/24/21 2109 12/25/21 0309 12/25/21 1710 12/26/21 0153  NA 141   < > 137 138 137  K 3.9   < > 3.9 4.0 3.8  CL 112*  --  108 105 105  CO2 24  --  24 25 26   GLUCOSE 121*  --  118* 170* 137*  BUN 18  --  18 20 23  CREATININE 0.88  --  0.79 0.93 0.79  CALCIUM 8.1*  --  8.3* 9.1 9.0  MG 2.5*  --  2.1 2.2  --   GFRNONAA >60  --  >60 >60 >60  ANIONGAP 5  --  5 8 6    < > = values in this interval not displayed.     Lipids No results for input(s): CHOL, TRIG, HDL, LABVLDL, LDLCALC, CHOLHDL in the last 168 hours.  Hematology Recent Labs  Lab 12/25/21 0309 12/25/21 1710 12/26/21 0153  WBC 11.9* 9.5 9.5  RBC 3.43* 3.15* 2.99*  HGB 10.1* 9.3* 8.9*  HCT 30.5* 28.5* 26.9*  MCV 88.9 90.5 90.0  MCH 29.4 29.5 29.8  MCHC 33.1 32.6 33.1  RDW 13.2 13.5 13.5  PLT 142* 121* 121*    Thyroid No results for input(s): TSH, FREET4 in the last 168 hours.  BNPNo results for input(s): BNP, PROBNP in the last 168 hours.  DDimer No results for input(s): DDIMER in the last 168 hours.   Radiology    DG Chest Port 1 View  Result Date: 12/25/2021 CLINICAL DATA:  Chest soreness following recent CABG.  EXAM: PORTABLE CHEST 1 VIEW COMPARISON:  Yesterday. FINDINGS: The endotracheal and nasogastric tubes have been removed. Stable right jugular catheter with its tip in the superior vena cava. Stable mildly enlarged cardiac silhouette. Decreased left basilar atelectasis without significant change in amount of right basilar atelectasis. Stable post CABG changes. No acute bony abnormality. IMPRESSION: Basilar atelectasis, decreased on the left. Electronically Signed   By: Claudie Revering M.D.   On: 12/25/2021 08:21   DG Chest Port 1 View  Result Date: 12/24/2021 CLINICAL DATA:  Post CABG EXAM: PORTABLE CHEST 1 VIEW COMPARISON:  12/23/2021 FINDINGS: Interval intubation, tip of the endotracheal tube is about 4.5 cm superior to the carina. Esophageal tube tip overlies the stomach, side-port in the region of GE junction. Right IJ catheter tip overlies the jugular/brachiocephalic confluence. Interval sternotomy changes with placement of mediastinal and chest drainage catheters. The right lung is grossly clear. Subsegmental atelectasis and possible small left effusion. No visible pneumothorax. IMPRESSION: 1. Interim placement of support lines and tubes as above. Esophageal tube side-port in the region of GE junction and further advancement could be considered for more optimal positioning 2. Subsegmental atelectasis left base with probable trace left effusion Electronically Signed   By: Donavan Foil M.D.   On: 12/24/2021 16:32   ECHO INTRAOPERATIVE TEE  Result Date: 12/24/2021  *INTRAOPERATIVE TRANSESOPHAGEAL REPORT *  Patient Name:   Ronald Petersen Date of Exam: 12/24/2021 Medical Rec #:  [id number]        Height:       71.0 in Accession #:    [id number]       Weight:       185.0 lb Date of Birth:  04/24/1956        BSA:          2.04 m Patient Age:    65 years         BP:           157/74 mmHg Patient Gender: M                HR:           46 bpm. Exam Location:  Inpatient Transesophogeal exam was perform  intraoperatively during surgical procedure. Patient was closely monitored under general anesthesia during the entirety of examination. Indications:     coronary artery bypass surgery  Performing Phys: [id number] Lucile Crater LIGHTFOOT Diagnosing Phys: Nolon Nations Ronald Complications: No known complications during this procedure. POST-OP IMPRESSIONS _ Left Ventricle: The left ventricle is unchanged from pre-bypass. _ Right Ventricle: The right ventricle appears unchanged from pre-bypass. _ Aorta: The aorta appears unchanged from pre-bypass. _ Left Atrial Appendage: The left atrial appendage appears unchanged from pre-bypass. _ Aortic Valve: The aortic valve appears unchanged from pre-bypass. _ Mitral Valve: The mitral valve appears unchanged from pre-bypass. _ Tricuspid Valve: The tricuspid valve appears unchanged from pre-bypass. _ Pulmonic Valve: The pulmonic valve appears unchanged from pre-bypass. PRE-OP FINDINGS  Left Ventricle: The left ventricle has normal systolic function, with an ejection fraction of 55-60%. The cavity size was mildly dilated. Right Ventricle: The right ventricle has normal systolic function. The cavity was normal. There is no increase in right ventricular wall thickness. Left Atrium: Left atrial size was dilated. No left atrial/left atrial appendage thrombus was detected. The left atrial appendage is well visualized and there is no evidence of thrombus present. Right Atrium: Right atrial size was normal in size. Interatrial Septum: No atrial level shunt detected by color flow Doppler. There is no evidence of a patent foramen ovale. Pericardium: There is no evidence of pericardial effusion. Mitral Valve: The mitral valve is normal in structure. Mitral valve regurgitation is mild by color flow Doppler. Tricuspid Valve: The tricuspid valve was normal in structure. Tricuspid valve regurgitation is mild by color flow Doppler. Aortic Valve: The aortic valve is tricuspid Aortic valve regurgitation is  trivial by color flow Doppler. There is no stenosis of the aortic valve. Pulmonic Valve: The pulmonic valve was normal in structure. Pulmonic valve regurgitation is trivial by color flow Doppler. Aorta: The ascending aorta and aortic root are normal in size and structure. There is evidence of plaque in the descending aorta; Grade II, measuring 2-33mm in size. Shunts: There is no evidence of an atrial septal defect. +-------------+------------++  AORTIC VALVE                 +-------------+------------++  AV Vmax:      206.00 cm/s    +-------------+------------++  AV Vmean:     124.000 cm/s   +-------------+------------++  AV VTI:       0.514 m        +-------------+------------++  AV Peak Grad: 17.0 mmHg      +-------------+------------++  AV Mean Grad: 8.0 mmHg       +-------------+------------++  Nolon Nations Ronald Electronically signed by Nolon Nations Ronald Signature Date/Time: 12/24/2021/5:45:24 PM    Final     Cardiac Studies   LHC 12/16/2021   Mid LM to Ost LAD lesion is 40% stenosed with 40% stenosed side branch in Ost Cx.   Ost LAD to Prox LAD lesion is 95% stenosed.   Prox LAD to Mid LAD DES Stent is widely patent   Prox Cx- 1st Mrg DES STENT is widely patent   1st Diag lesion is 25% stenosed.   RPAV lesion is 50% stenosed.   -----------------------------------   The left ventricular systolic function is normal.  The left ventricular ejection fraction is 55-65% by visual estimate.   LV end diastolic pressure is normal.   There is no aortic valve stenosis.     TTE 12/16/2021 IMPRESSIONS   1. Left ventricular ejection fraction, by estimation, is 60 to 65%. The left ventricle has normal function. The left ventricle has no regional wall motion abnormalities. There is moderate asymmetric left ventricular hypertrophy of the  basal-septal  segment. Left ventricular diastolic parameters are indeterminate.   2. Right ventricular systolic function is normal. The right ventricular size is normal. There  is mildly elevated pulmonary artery systolic pressure. The estimated right ventricular systolic pressure is 00.8 mmHg.   3. Left atrial size was mildly dilated.   4. The mitral valve is normal in structure. Trivial mitral valve  regurgitation. No evidence of mitral stenosis.   5. The aortic valve is tricuspid. Aortic valve regurgitation is trivial.  Mild aortic valve stenosis. Vmax 2.2 m/s, MG 4mmHg, AVA 2.0 cm^2, DI 0.54   6. The inferior vena cava is dilated in size with <50% respiratory  variability, suggesting right atrial pressure of 15 mmHg.    Patient Profile     65 y.o. male with history of HTN, DM, HLD, obesity, OSA, CAD s/p stent presented for an elective LHC found to have 95% ostial LAD stenosis now s/p LIMA-LAD and SVG-OM on 12/27.  Assessment & Plan    #Multivessel CAD with ostial LAD stenosis: S/p CABG with LIMA-LAD and SVG-OM on 12/27. Doing well post-op. -Post-op care per CV surgery -Plan to remove wires and chest tubes today -Continue ASA 325mg  daily -Resuming home ticagrelor -Continue crestor 40mg  daily -Will add GDMT with BB and ARB once able  #HTN: -Holding while post-op   #HLD: -Continue crestor 40mg  daily as above  #OSA: -On CPAP at home  #DMII: -ISS      For questions or updates, please contact [facility] Please consult [email or web address] for contact info under        Signed, Freada Bergeron, Ronald  12/26/2021, 6:12 AM

## 2021-12-27 ENCOUNTER — Other Ambulatory Visit (HOSPITAL_COMMUNITY): Payer: Self-pay

## 2021-12-27 DIAGNOSIS — E119 Type 2 diabetes mellitus without complications: Secondary | ICD-10-CM

## 2021-12-27 DIAGNOSIS — I2510 Atherosclerotic heart disease of native coronary artery without angina pectoris: Secondary | ICD-10-CM | POA: Diagnosis not present

## 2021-12-27 DIAGNOSIS — I10 Essential (primary) hypertension: Secondary | ICD-10-CM | POA: Diagnosis not present

## 2021-12-27 DIAGNOSIS — E785 Hyperlipidemia, unspecified: Secondary | ICD-10-CM | POA: Diagnosis not present

## 2021-12-27 LAB — BASIC METABOLIC PANEL
Anion gap: 9 (ref 5–15)
BUN: 23 mg/dL (ref 8–23)
CO2: 22 mmol/L (ref 22–32)
Calcium: 8.6 mg/dL — ABNORMAL LOW (ref 8.9–10.3)
Chloride: 107 mmol/L (ref 98–111)
Creatinine, Ser: 0.82 mg/dL (ref 0.61–1.24)
GFR, Estimated: >60 mL/min (ref >60)
Glucose, Bld: 143 mg/dL — ABNORMAL HIGH (ref 70–99)
Potassium: 3.3 mmol/L — ABNORMAL LOW (ref 3.5–5.1)
Sodium: 138 mmol/L (ref 135–145)

## 2021-12-27 LAB — TYPE AND SCREEN
ABO/RH(D): O POS
Antibody Screen: NEGATIVE
Unit division: 0
Unit division: 0

## 2021-12-27 LAB — GLUCOSE, CAPILLARY
Glucose-Capillary: 147 mg/dL — ABNORMAL HIGH (ref 70–99)
Glucose-Capillary: 132 mg/dL — ABNORMAL HIGH (ref 70–99)
Glucose-Capillary: 113 mg/dL — ABNORMAL HIGH (ref 70–99)
Glucose-Capillary: 128 mg/dL — ABNORMAL HIGH (ref 70–99)
Glucose-Capillary: 112 mg/dL — ABNORMAL HIGH (ref 70–99)

## 2021-12-27 LAB — CBC
HCT: 24.9 % — ABNORMAL LOW (ref 39.0–52.0)
Hemoglobin: 8.2 g/dL — ABNORMAL LOW (ref 13.0–17.0)
MCH: 29.1 pg (ref 26.0–34.0)
MCHC: 32.9 g/dL (ref 30.0–36.0)
MCV: 88.3 fL (ref 80.0–100.0)
Platelets: 106 10*3/uL — ABNORMAL LOW (ref 150–400)
RBC: 2.82 MIL/uL — ABNORMAL LOW (ref 4.22–5.81)
RDW: 13.2 % (ref 11.5–15.5)
WBC: 8.1 10*3/uL (ref 4.0–10.5)
nRBC: 0 % (ref 0.0–0.2)

## 2021-12-27 LAB — BPAM RBC
Blood Product Expiration Date: 202301022359
Blood Product Expiration Date: 202301162359
ISSUE DATE / TIME: 202212251048
Unit Type and Rh: 5100
Unit Type and Rh: 5100

## 2021-12-27 MED ORDER — ASPIRIN EC 325 MG PO TBEC
325.0000 mg | DELAYED_RELEASE_TABLET | Freq: Every day | ORAL | Status: DC
  Administered 2021-12-28: 325 mg via ORAL
  Filled 2021-12-27: qty 1

## 2021-12-27 MED ORDER — FE FUMARATE-B12-VIT C-FA-IFC PO CAPS
1.0000 | ORAL_CAPSULE | Freq: Two times a day (BID) | ORAL | Status: DC
  Administered 2021-12-27 – 2021-12-28 (×3): 1 via ORAL
  Filled 2021-12-27 (×3): qty 1

## 2021-12-27 MED ORDER — INSULIN ASPART 100 UNIT/ML IJ SOLN
0.0000 [IU] | INTRAMUSCULAR | Status: DC
Start: 2021-12-27 — End: 2021-12-27
  Administered 2021-12-27: 17:00:00 2 [IU] via SUBCUTANEOUS

## 2021-12-27 MED ORDER — POTASSIUM CHLORIDE CRYS ER 20 MEQ PO TBCR
30.0000 meq | EXTENDED_RELEASE_TABLET | Freq: Two times a day (BID) | ORAL | Status: AC
  Administered 2021-12-27 (×2): 30 meq via ORAL
  Filled 2021-12-27 (×2): qty 1

## 2021-12-27 MED ORDER — EMPAGLIFLOZIN 10 MG PO TABS
10.0000 mg | ORAL_TABLET | Freq: Every day | ORAL | Status: DC
  Administered 2021-12-27 – 2021-12-28 (×2): 10 mg via ORAL
  Filled 2021-12-27 (×2): qty 1

## 2021-12-27 MED ORDER — ASPIRIN 81 MG PO CHEW: 81.0000 mg | CHEWABLE_TABLET | Freq: Every day | ORAL | Status: DC

## 2021-12-27 MED ORDER — INSULIN ASPART 100 UNIT/ML IJ SOLN: 0.0000 [IU] | Freq: Three times a day (TID) | INTRAMUSCULAR | Status: DC

## 2021-12-27 NOTE — Progress Notes (Addendum)
Progress Note  Patient Name: Ronald Petersen Date of Encounter: 12/27/2021  [facility] Cardiologist: Berniece Salines, DO   Subjective   Still has chest soreness, no significant SOB.   Inpatient Medications    Scheduled Meds:  acetaminophen  1,000 mg Oral Q6H   Or   acetaminophen (TYLENOL) oral liquid 160 mg/5 mL  1,000 mg Per Tube Q6H   aspirin EC  81 mg Oral Daily   Or   aspirin  81 mg Per Tube Daily   bisacodyl  10 mg Oral Daily   Or   bisacodyl  10 mg Rectal Daily   Chlorhexidine Gluconate Cloth  6 each Topical Daily   docusate sodium  200 mg Oral Daily   ferrous [id number] C-folic acid  1 capsule Oral BID PC   insulin aspart  0-24 Units Subcutaneous Q4H   metoprolol tartrate  12.5 mg Oral BID   pantoprazole  40 mg Oral Daily   potassium chloride  30 mEq Oral BID   rosuvastatin  40 mg Oral Daily   sertraline  150 mg Oral Daily   sodium chloride flush  10-40 mL Intracatheter Q12H   sodium chloride flush  3 mL Intravenous Q12H   sodium chloride flush  3 mL Intravenous Q12H   ticagrelor  90 mg Oral BID   Continuous Infusions:  sodium chloride Stopped (12/25/21 1555)   sodium chloride     sodium chloride     sodium chloride     insulin Stopped (12/25/21 0922)   lactated ringers     lactated ringers Stopped (12/25/21 0926)   lactated ringers Stopped (12/25/21 0923)   promethazine (PHENERGAN) injection (IM or IVPB) Stopped (12/25/21 2200)   PRN Meds: sodium chloride, sodium chloride, dextrose, lactated ringers, metoCLOPramide (REGLAN) injection, metoprolol tartrate, midazolam, morphine injection, ondansetron (ZOFRAN) IV, oxyCODONE, promethazine (PHENERGAN) injection (IM or IVPB), sodium chloride flush, sodium chloride flush, sodium chloride flush, traMADol   Vital Signs    Vitals:   12/26/21 2328 12/27/21 0154 12/27/21 0337 12/27/21 0840  BP: (!) 87/35 (!) 106/54 (!) 104/35 (!) 115/55  Pulse: 76 77 78 79  Resp: 15 15 17 16   Temp: 98.8 F (37.1 C)   98.8 F (37.1 C) 98.6 F (37 C)  TempSrc: Oral  Oral Oral  SpO2: 99% 97% 97% 100%  Weight:      Height:        Intake/Output Summary (Last 24 hours) at 12/27/2021 0919 Last data filed at 12/27/2021 0843 Gross per 24 hour  Intake 240 ml  Output 1300 ml  Net -1060 ml   Last 3 Weights 12/26/2021 12/25/2021 12/24/2021  Weight (lbs) 184 lb 3.2 oz 188 lb 9.6 oz 185 lb  Weight (kg) 83.553 kg 85.548 kg 83.915 kg      Telemetry    NSR with occasional PVC, no sustained ventricular ectopy- Personally Reviewed  ECG    12/25/2021 NSR without significant ST-T wave changes  - Personally Reviewed  Physical Exam   GEN: No acute distress.   Neck: No JVD Cardiac: RRR, no murmurs, rubs, or gallops. Sternotomy scar covered with dressing, no bleeding or active drainage.  Respiratory: Clear to auscultation bilaterally. GI: Soft, nontender, non-distended  MS: No edema; No deformity. Neuro:  Nonfocal  Psych: Normal affect   Labs    High Sensitivity Troponin:  No results for input(s): TROPONINIHS in the last 720 hours.   Chemistry Recent Labs  Lab 12/24/21 2102 12/24/21 2109 12/25/21 0309 12/25/21 1710 12/26/21 0153 12/27/21  0142  NA 141   < > 137 138 137 138  K 3.9   < > 3.9 4.0 3.8 3.3*  CL 112*  --  108 105 105 107  CO2 24  --  24 25 26 22   GLUCOSE 121*  --  118* 170* 137* 143*  BUN 18  --  18 20 23 23   CREATININE 0.88  --  0.79 0.93 0.79 0.82  CALCIUM 8.1*  --  8.3* 9.1 9.0 8.6*  MG 2.5*  --  2.1 2.2  --   --   GFRNONAA >60  --  >60 >60 >60 >60  ANIONGAP 5  --  5 8 6 9    < > = values in this interval not displayed.    Lipids No results for input(s): CHOL, TRIG, HDL, LABVLDL, LDLCALC, CHOLHDL in the last 168 hours.  Hematology Recent Labs  Lab 12/25/21 1710 12/26/21 0153 12/27/21 0142  WBC 9.5 9.5 8.1  RBC 3.15* 2.99* 2.82*  HGB 9.3* 8.9* 8.2*  HCT 28.5* 26.9* 24.9*  MCV 90.5 90.0 88.3  MCH 29.5 29.8 29.1  MCHC 32.6 33.1 32.9  RDW 13.5 13.5 13.2  PLT 121*  121* 106*   Thyroid No results for input(s): TSH, FREET4 in the last 168 hours.  BNPNo results for input(s): BNP, PROBNP in the last 168 hours.  DDimer No results for input(s): DDIMER in the last 168 hours.   Radiology    DG Chest Port 1 View  Result Date: 12/26/2021 CLINICAL DATA:  Chest tube present EXAM: PORTABLE CHEST 1 VIEW COMPARISON:  12/25/2021 FINDINGS: Unchanged right IJ central line and left chest tube. Patchy bibasilar atelectasis. Probable trace pleural effusions. No pneumothorax. Similar cardiomediastinal contours. IMPRESSION: Persistent patchy bibasilar atelectasis and probable trace pleural effusions. No pneumothorax. Electronically Signed   By: Macy Mis M.D.   On: 12/26/2021 08:47    Cardiac Studies   Cath 12/16/2021   Mid LM to Ost LAD lesion is 40% stenosed with 40% stenosed side branch in Ost Cx.   Ost LAD to Prox LAD lesion is 95% stenosed.   Prox LAD to Mid LAD DES Stent is widely patent   Prox Cx- 1st Mrg DES STENT is widely patent   1st Diag lesion is 25% stenosed.   RPAV lesion is 50% stenosed.   -----------------------------------   The left ventricular systolic function is normal.  The left ventricular ejection fraction is 55-65% by visual estimate.   LV end diastolic pressure is normal.   There is no aortic valve stenosis.   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 ->   Location of the stenosis and involvement of left main makes PCI of the LAD suboptimal especially in the patient who has pending surgery.  Would not be willing to stop antiplatelet agent after 3 to 6 months in the setting of Left Main Stenting. Only minimal RCA disease as well as downstream LCx and LAD disease. Preserved LVEF normal EDP.   RECOMMENDATION Based on significance of ostial left main disease and level of his symptoms, I feel it is safer to admit him to  the hospital for CVTS consultation.  We will start IV heparin. Titrate home dose of statin Restart BP meds Sliding scale insulin  Patient Profile     65 y.o. male with PMH of HTN, HLD, DM II, obesity, OSA, CAD who presented for elective cath  that showed 95% ost LAD, now s/p LIMA-LAD and SVG-OM on 12/27   Assessment & Plan    Multivessel CAD with ost LAD  - Cath 12/16/2021 showed 40% mid LM, 95% ost LAD, patent prox to mid LAD stent, patent OM1 stent, 50% RPAV, EF 55-65% - Echo 12/19 EF 60-65%, RVSP 37.1 mmHg, trivial MR.  - s/p CABG with LIMA-LAD and SVG-OM on 12/27 - 81 mg ASA, Corestor. On low dose metoprolol 12.5mg  BID  - recovering well. No sign of CHF. EF normal on Echo. No further recommendation.   - discussed with MD, since last PCI was in Nov 2021, Dr. Ali Lowe recommends switch Brilinta to plavix or stopping Brillinta and increasing ASA to 325mg  to enhance graft patency. Continue on ASA.   HTN: BP low overnight, however now in the 403F systolic range. No significant dizziness.  HLD: Crestor 40mg  daily  DM II:  Start Jardiance 10mg   OSA      For questions or updates, please contact [place] Please consult [email or web address] for contact info under        Signed, Almyra Deforest, [place]  12/27/2021, 9:19 AM    ATTENDING ATTESTATION:  After conducting a review of all available clinical information with the care team, interviewing the patient, and performing a physical exam, I agree with the findings and plan described in this note.   GEN: No acute distress.   Cardiac: RRR, no murmurs, rubs, or gallops.  Respiratory: Clear to auscultation bilaterally. GI: Soft, nontender, non-distended  MS: No edema; No deformity. Neuro:  Nonfocal   Patient is doing well after 2V CABG.  DAPT can be considered for graft patency and this usually is with ASA and plavix.  Alternatively, ASA monotherapy (325mg ) should be considered.  Given history of DM, will start Jardiance 10mg  qday.  Start  ACEI or ARB later this admission or as outpatient.  Cont Crestor.  Lenna Sciara, MD Pager [phone]

## 2021-12-27 NOTE — Progress Notes (Signed)
CARDIAC REHAB PHASE I   PRE:  Rate/Rhythm: 70 SR    BP: sitting 89/45    SaO2: 96 RA  MODE:  Ambulation: 470 ft   POST:  Rate/Rhythm: 76 SR    BP: sitting 109/69     SaO2: 98 RA  Min-mod assist to get to EOB. Stood unassisted and walked with assist x1. Fairly steady without c/o, x1 LOB when distracted. He has RW and cane at home if needed. No c/o, to recliner, VSS (BP low in bed before walk).  Pt without IS, got one and he performed 1600 ml.  Discussed with pt IS use, sternal precautions, diet, exercise, and CRPII. Receptive. Will refer to [place].  [id number] [place], ACSM 12/27/2021 11:17 AM

## 2021-12-27 NOTE — Progress Notes (Signed)
Mobility Specialist: Progress Note   12/27/21 1249  Mobility  Activity Ambulated in hall  Level of Assistance Contact guard assist, steadying assist  Assistive Device None  Distance Ambulated (ft) 470 ft  Mobility Ambulated with assistance in hallway  Mobility Response Tolerated well  Mobility performed by Mobility specialist  $Mobility charge 1 Mobility   Pre-Mobility: 75 HR Post-Mobility: 77 HR, 137/51 BP, 100% SpO2  Pt had no c/o throughout ambulation. Pt back to bed after walk per request with call bell and phone at his side. Pt's son present in the room.   [facility] Kalista Laguardia Mobility Specialist Mobility Specialist 4 [place]: [phone] Mobility Specialist 2 [place] and [place]: [phone]

## 2021-12-27 NOTE — TOC Benefit Eligibility Note (Signed)
Patient Teacher, English as a foreign language completed.    The patient is currently admitted and upon discharge could be taking Farxiga 10 mg.  The current 30 day co-pay is, $47.00.   The patient is currently admitted and upon discharge could be taking Jardiance 10 mg.  The current 30 day co-pay is, $47.00.   The patient is insured through [place], [place] Patient Advocate Specialist [place] Patient Advocate Team Direct Number: [phone]  Fax: [phone]

## 2021-12-27 NOTE — Progress Notes (Addendum)
[address]       [place] [address]             [phone]      3 Days Post-Op Procedure(s) (LRB): CORONARY ARTERY BYPASS GRAFTING (CABG), ON PUMP, TIMES TWO, USING LEFT INTERNAL MAMMARY ARTERY AND RIGHT ENDOSCOPICALLY HARVESTED GREATER SAPHENOUS VEIN (N/A) TRANSESOPHAGEAL ECHOCARDIOGRAM (TEE) (N/A) ENDOVEIN HARVEST OF GREATER SAPHENOUS VEIN (Right) Subjective: No specific c/o  Objective: Vital signs in last 24 hours: Temp:  [98 F (36.7 C)-99.7 F (37.6 C)] 98.8 F (37.1 C) (12/30 0337) Pulse Rate:  [70-82] 78 (12/30 0337) Cardiac Rhythm: Normal sinus rhythm (12/29 1955) Resp:  [14-21] 17 (12/30 0337) BP: (87-139)/(35-65) 104/35 (12/30 0337) SpO2:  [94 %-100 %] 97 % (12/30 0337) FiO2 (%):  [21 %] 21 % (12/29 0748)  Hemodynamic parameters for last 24 hours:    Intake/Output from previous day: 12/29 0701 - 12/30 0700 In: 240 [P.O.:240] Out: 1100 [Urine:1100] Intake/Output this shift: No intake/output data recorded.  General appearance: alert, cooperative, and no distress Heart: regular rate and rhythm Lungs: clear to auscultation bilaterally Abdomen: benign exam Extremities: no edema Wound: incis healing ok, + some echymosis right thigh, no hematoma  Lab Results: Recent Labs    12/26/21 0153 12/27/21 0142  WBC 9.5 8.1  HGB 8.9* 8.2*  HCT 26.9* 24.9*  PLT 121* 106*   BMET:  Recent Labs    12/26/21 0153 12/27/21 0142  NA 137 138  K 3.8 3.3*  CL 105 107  CO2 26 22  GLUCOSE 137* 143*  BUN 23 23  CREATININE 0.79 0.82  CALCIUM 9.0 8.6*    PT/INR:  Recent Labs    12/24/21 1600  LABPROT 16.0*  INR 1.3*   ABG    Component Value Date/Time   PHART 7.269 (L) 12/24/2021 2326   HCO3 24.9 12/24/2021 2326   TCO2 27 12/24/2021 2326   ACIDBASEDEF 2.0 12/24/2021 2326   O2SAT 100.0 12/24/2021 2326   CBG (last 3)  Recent Labs    12/26/21 2054 12/26/21 2327 12/27/21 0341  GLUCAP 124* 107* 147*    Meds Scheduled Meds:   acetaminophen  1,000 mg Oral Q6H   Or   acetaminophen (TYLENOL) oral liquid 160 mg/5 mL  1,000 mg Per Tube Q6H   aspirin EC  81 mg Oral Daily   Or   aspirin  81 mg Per Tube Daily   bisacodyl  10 mg Oral Daily   Or   bisacodyl  10 mg Rectal Daily   Chlorhexidine Gluconate Cloth  6 each Topical Daily   docusate sodium  200 mg Oral Daily   insulin aspart  0-24 Units Subcutaneous Q4H   metoprolol tartrate  12.5 mg Oral BID   pantoprazole  40 mg Oral Daily   rosuvastatin  40 mg Oral Daily   sertraline  150 mg Oral Daily   sodium chloride flush  10-40 mL Intracatheter Q12H   sodium chloride flush  3 mL Intravenous Q12H   sodium chloride flush  3 mL Intravenous Q12H   ticagrelor  90 mg Oral BID   Continuous Infusions:  sodium chloride Stopped (12/25/21 1555)   sodium chloride     sodium chloride     sodium chloride     insulin Stopped (12/25/21 0922)   lactated ringers     lactated ringers Stopped (12/25/21 0926)   lactated ringers Stopped (12/25/21 0923)   phenylephrine (NEO-SYNEPHRINE) Adult infusion Stopped (12/25/21 0847)   promethazine (  PHENERGAN) injection (IM or IVPB) Stopped (12/25/21 2200)   PRN Meds:.sodium chloride, sodium chloride, dextrose, lactated ringers, metoCLOPramide (REGLAN) injection, metoprolol tartrate, midazolam, morphine injection, ondansetron (ZOFRAN) IV, oxyCODONE, promethazine (PHENERGAN) injection (IM or IVPB), sodium chloride flush, sodium chloride flush, sodium chloride flush, traMADol  Xrays DG Chest Port 1 View  Result Date: 12/26/2021 CLINICAL DATA:  Chest tube present EXAM: PORTABLE CHEST 1 VIEW COMPARISON:  12/25/2021 FINDINGS: Unchanged right IJ central line and left chest tube. Patchy bibasilar atelectasis. Probable trace pleural effusions. No pneumothorax. Similar cardiomediastinal contours. IMPRESSION: Persistent patchy bibasilar atelectasis and probable trace pleural effusions. No pneumothorax. Electronically Signed   By: Macy Mis M.D.    On: 12/26/2021 08:47    Assessment/Plan: S/P Procedure(s) (LRB): CORONARY ARTERY BYPASS GRAFTING (CABG), ON PUMP, TIMES TWO, USING LEFT INTERNAL MAMMARY ARTERY AND RIGHT ENDOSCOPICALLY HARVESTED GREATER SAPHENOUS VEIN (N/A) TRANSESOPHAGEAL ECHOCARDIOGRAM (TEE) (N/A) ENDOVEIN HARVEST OF GREATER SAPHENOUS VEIN (Right)  1 Tmax 99.7, VSS, S BP 87-139, mostly low 100's, dinus with frequent PVC's including trigemminy. K+ 3.3- will replace 2 sats good on RA 3 UOP good, not weighed yet- normal renal fxn 4 H/H conts to slowly trend down, hopefully just equalibrating- add trinsicon- will hemocult stool 5 thrombocytopenia, trend lowering- on brilinta and baby ASA-may need to consider stopping one of these agents- cont to trend for now 6 no leukocytosis 7 BS adeq controlled 8 routine pulm hygiene and cardiac rehab   LOS: 11 days    John Giovanni PA-C Pager [phone] 12/27/2021   Agree with above. Overall doing well Home this weekend.  [place] Peifer Bary Leriche

## 2021-12-28 ENCOUNTER — Other Ambulatory Visit: Payer: Self-pay | Admitting: Surgical

## 2021-12-28 LAB — BASIC METABOLIC PANEL
Anion gap: 8 (ref 5–15)
BUN: 20 mg/dL (ref 8–23)
CO2: 24 mmol/L (ref 22–32)
Calcium: 8.8 mg/dL — ABNORMAL LOW (ref 8.9–10.3)
Chloride: 107 mmol/L (ref 98–111)
Creatinine, Ser: 0.83 mg/dL (ref 0.61–1.24)
GFR, Estimated: >60 mL/min (ref >60)
Glucose, Bld: 112 mg/dL — ABNORMAL HIGH (ref 70–99)
Potassium: 4 mmol/L (ref 3.5–5.1)
Sodium: 139 mmol/L (ref 135–145)

## 2021-12-28 LAB — GLUCOSE, CAPILLARY: Glucose-Capillary: 105 mg/dL — ABNORMAL HIGH (ref 70–99)

## 2021-12-28 LAB — CBC
HCT: 25.8 % — ABNORMAL LOW (ref 39.0–52.0)
Hemoglobin: 8.8 g/dL — ABNORMAL LOW (ref 13.0–17.0)
MCH: 29.9 pg (ref 26.0–34.0)
MCHC: 34.1 g/dL (ref 30.0–36.0)
MCV: 87.8 fL (ref 80.0–100.0)
Platelets: 140 10*3/uL — ABNORMAL LOW (ref 150–400)
RBC: 2.94 MIL/uL — ABNORMAL LOW (ref 4.22–5.81)
RDW: 13 % (ref 11.5–15.5)
WBC: 6.8 10*3/uL (ref 4.0–10.5)
nRBC: 0 % (ref 0.0–0.2)

## 2021-12-28 MED ORDER — EMPAGLIFLOZIN 10 MG PO TABS
10.0000 mg | ORAL_TABLET | Freq: Every day | ORAL | 1 refills | Status: DC
  Filled 2021-12-28: qty 30, 30d supply, fill #0
  Filled 2022-01-23: qty 30, 30d supply, fill #1

## 2021-12-28 MED ORDER — METOPROLOL TARTRATE 25 MG PO TABS
12.5000 mg | ORAL_TABLET | Freq: Two times a day (BID) | ORAL | 1 refills | Status: DC
  Filled 2021-12-28: qty 30, 30d supply, fill #0

## 2021-12-28 MED ORDER — EMPAGLIFLOZIN 10 MG PO TABS: 10.0000 mg | ORAL_TABLET | Freq: Every day | ORAL | Status: DC

## 2021-12-28 MED ORDER — OXYCODONE HCL 5 MG PO TABS: 5.0000 mg | ORAL_TABLET | Freq: Four times a day (QID) | ORAL | 0 refills | Status: DC | PRN

## 2021-12-28 MED ORDER — POLYETHYLENE GLYCOL 3350 17 GM/SCOOP PO POWD: 17.0000 g | Freq: Two times a day (BID) | ORAL | Status: DC | PRN

## 2021-12-28 MED ORDER — PANTOPRAZOLE SODIUM 40 MG PO TBEC: DELAYED_RELEASE_TABLET | ORAL | 3 refills | Status: DC

## 2021-12-28 MED ORDER — ASPIRIN 325 MG PO TBEC
325.0000 mg | DELAYED_RELEASE_TABLET | Freq: Every day | ORAL | Status: AC
Start: 2021-12-28 — End: ?

## 2021-12-28 MED ORDER — TRAZODONE HCL 50 MG PO TABS: 50.0000 mg | ORAL_TABLET | Freq: Every day | ORAL | Status: DC

## 2021-12-28 MED ORDER — OXYCODONE HCL 5 MG PO TABS
5.0000 mg | ORAL_TABLET | Freq: Four times a day (QID) | ORAL | 0 refills | Status: DC | PRN
  Filled 2021-12-28 – 2022-01-02 (×2): qty 28, 7d supply, fill #0

## 2021-12-28 MED ORDER — FE FUMARATE-B12-VIT C-FA-IFC PO CAPS
1.0000 | ORAL_CAPSULE | Freq: Two times a day (BID) | ORAL | 1 refills | Status: DC
  Filled 2021-12-28: qty 100, 50d supply, fill #0
  Filled 2022-02-18 – 2022-02-21 (×2): qty 100, 50d supply, fill #1
  Filled 2022-02-25: qty 20, 10d supply, fill #1

## 2021-12-28 NOTE — Plan of Care (Signed)
°  Problem: Clinical Measurements: Goal: Will remain free from infection Outcome: Adequate for Discharge Goal: Diagnostic test results will improve Outcome: Adequate for Discharge Goal: Cardiovascular complication will be avoided Outcome: Adequate for Discharge   Problem: Activity: Goal: Risk for activity intolerance will decrease Outcome: Adequate for Discharge   Problem: Coping: Goal: Level of anxiety will decrease Outcome: Adequate for Discharge   Problem: Skin Integrity: Goal: Risk for impaired skin integrity will decrease Outcome: Adequate for Discharge   Problem: Education: Goal: Individualized Educational Video(s) Outcome: Adequate for Discharge   Problem: Activity: Goal: Ability to return to baseline activity level will improve Outcome: Adequate for Discharge   Problem: Cardiovascular: Goal: Ability to achieve and maintain adequate cardiovascular perfusion will improve Outcome: Adequate for Discharge   Problem: Health Behavior/Discharge Planning: Goal: Ability to safely manage health-related needs after discharge will improve Outcome: Adequate for Discharge   Problem: Education: Goal: Will demonstrate proper wound care and an understanding of methods to prevent future damage Outcome: Adequate for Discharge Goal: Knowledge of disease or condition will improve Outcome: Adequate for Discharge Goal: Knowledge of the prescribed therapeutic regimen will improve Outcome: Adequate for Discharge Goal: Individualized Educational Video(s) Outcome: Adequate for Discharge   Problem: Activity: Goal: Risk for activity intolerance will decrease Outcome: Adequate for Discharge   Problem: Cardiac: Goal: Will achieve and/or maintain hemodynamic stability Outcome: Adequate for Discharge   Problem: Clinical Measurements: Goal: Postoperative complications will be avoided or minimized Outcome: Adequate for Discharge   Problem: Respiratory: Goal: Respiratory status will  improve Outcome: Adequate for Discharge   Problem: Skin Integrity: Goal: Wound healing without signs and symptoms of infection Outcome: Adequate for Discharge Goal: Risk for impaired skin integrity will decrease Outcome: Adequate for Discharge   Problem: Urinary Elimination: Goal: Ability to achieve and maintain adequate renal perfusion and functioning will improve Outcome: Adequate for Discharge

## 2021-12-28 NOTE — Progress Notes (Signed)
[address]       [place] [address]             [phone]      4 Days Post-Op Procedure(s) (LRB): CORONARY ARTERY BYPASS GRAFTING (CABG), ON PUMP, TIMES TWO, USING LEFT INTERNAL MAMMARY ARTERY AND RIGHT ENDOSCOPICALLY HARVESTED GREATER SAPHENOUS VEIN (N/A) TRANSESOPHAGEAL ECHOCARDIOGRAM (TEE) (N/A) ENDOVEIN HARVEST OF GREATER SAPHENOUS VEIN (Right) Subjective: Doing well, feels better  Objective: Vital signs in last 24 hours: Temp:  [97.7 F (36.5 C)-98.8 F (37.1 C)] 98.8 F (37.1 C) (12/31 0344) Pulse Rate:  [70-79] 70 (12/31 0344) Cardiac Rhythm: Normal sinus rhythm (12/30 1944) Resp:  [14-19] 16 (12/31 0345) BP: (98-136)/(49-88) 98/52 (12/31 0344) SpO2:  [95 %-100 %] 96 % (12/31 0344) Weight:  [82.4 kg] 82.4 kg (12/31 0624)  Hemodynamic parameters for last 24 hours:    Intake/Output from previous day: 12/30 0701 - 12/31 0700 In: 720 [P.O.:720] Out: 1575 [Urine:1575] Intake/Output this shift: No intake/output data recorded.  General appearance: alert, cooperative, and no distress Heart: regular rate and rhythm Lungs: clear to auscultation bilaterally Abdomen: benign Extremities: no edema Wound: healing well, right thigh echymosis is stable   Lab Results: Recent Labs    12/27/21 0142 12/28/21 0337  WBC 8.1 6.8  HGB 8.2* 8.8*  HCT 24.9* 25.8*  PLT 106* 140*   BMET:  Recent Labs    12/27/21 0142 12/28/21 0337  NA 138 139  K 3.3* 4.0  CL 107 107  CO2 22 24  GLUCOSE 143* 112*  BUN 23 20  CREATININE 0.82 0.83  CALCIUM 8.6* 8.8*    PT/INR: No results for input(s): LABPROT, INR in the last 72 hours. ABG    Component Value Date/Time   PHART 7.269 (L) 12/24/2021 2326   HCO3 24.9 12/24/2021 2326   TCO2 27 12/24/2021 2326   ACIDBASEDEF 2.0 12/24/2021 2326   O2SAT 100.0 12/24/2021 2326   CBG (last 3)  Recent Labs    12/27/21 1714 12/27/21 2127 12/28/21 0651  GLUCAP 128* 112* 105*    Meds Scheduled Meds:   acetaminophen  1,000 mg Oral Q6H   Or   acetaminophen (TYLENOL) oral liquid 160 mg/5 mL  1,000 mg Per Tube Q6H   aspirin EC  325 mg Oral Daily   Or   aspirin  81 mg Per Tube Daily   bisacodyl  10 mg Oral Daily   Or   bisacodyl  10 mg Rectal Daily   docusate sodium  200 mg Oral Daily   empagliflozin  10 mg Oral Daily   ferrous [id number] C-folic acid  1 capsule Oral BID PC   insulin aspart  0-24 Units Subcutaneous TID WC   metoprolol tartrate  12.5 mg Oral BID   pantoprazole  40 mg Oral Daily   rosuvastatin  40 mg Oral Daily   sertraline  150 mg Oral Daily   Continuous Infusions: PRN Meds:.metoCLOPramide (REGLAN) injection, metoprolol tartrate, morphine injection, ondansetron (ZOFRAN) IV, oxyCODONE, traMADol  Xrays No results found.  Assessment/Plan: S/P Procedure(s) (LRB): CORONARY ARTERY BYPASS GRAFTING (CABG), ON PUMP, TIMES TWO, USING LEFT INTERNAL MAMMARY ARTERY AND RIGHT ENDOSCOPICALLY HARVESTED GREATER SAPHENOUS VEIN (N/A) TRANSESOPHAGEAL ECHOCARDIOGRAM (TEE) (N/A) ENDOVEIN HARVEST OF GREATER SAPHENOUS VEIN (Right)   1 afeb, VSS, sinus rhythm, BP too low to initiate ARB/ACE-I at this time , poss start as outpatient  2 sats good on RA 3 H/H improved so doubt active bleeding and still equilibrating volume status and expected  ABLA, no hemocult done 4 thrombocytopenia improved, off Brilinta with plans onlt for 325 ASA daily 5 BS well controlled- jardiance started per cards 6 doing well with rehab 7 appears stable for d/c, reviewed instructions   LOS: 12 days    Ronald Petersen 12/28/2021

## 2021-12-28 NOTE — Progress Notes (Signed)
Mobility Specialist: Progress Note   12/28/21 1002  Mobility  Activity Refused mobility   Pt refused mobility stating he is hopeful for discharge soon.   [facility] Jenese Mischke Mobility Specialist Mobility Specialist 4 [place]: [phone] Mobility Specialist 2 [place] and [place]: [phone]

## 2021-12-28 NOTE — Progress Notes (Signed)
Patient d/c from 4E to home. Discharge instructions given and medication education given to pt and son at bedside.   Raelyn Number, RN

## 2021-12-30 ENCOUNTER — Other Ambulatory Visit (HOSPITAL_BASED_OUTPATIENT_CLINIC_OR_DEPARTMENT_OTHER): Payer: Self-pay

## 2021-12-31 ENCOUNTER — Other Ambulatory Visit: Payer: Self-pay | Admitting: Gastroenterology

## 2021-12-31 ENCOUNTER — Other Ambulatory Visit: Payer: Self-pay | Admitting: Family Medicine

## 2021-12-31 ENCOUNTER — Other Ambulatory Visit (HOSPITAL_BASED_OUTPATIENT_CLINIC_OR_DEPARTMENT_OTHER): Payer: Self-pay

## 2021-12-31 DIAGNOSIS — M25512 Pain in left shoulder: Secondary | ICD-10-CM

## 2021-12-31 DIAGNOSIS — G8929 Other chronic pain: Secondary | ICD-10-CM

## 2022-01-01 ENCOUNTER — Other Ambulatory Visit (HOSPITAL_BASED_OUTPATIENT_CLINIC_OR_DEPARTMENT_OTHER): Payer: Self-pay

## 2022-01-01 ENCOUNTER — Telehealth: Payer: Self-pay | Admitting: Family Medicine

## 2022-01-01 MED ORDER — TIZANIDINE HCL 4 MG PO TABS
4.0000 mg | ORAL_TABLET | Freq: Four times a day (QID) | ORAL | 1 refills | Status: DC | PRN
  Filled 2022-01-01: qty 30, 8d supply, fill #0
  Filled 2022-01-29: qty 30, 8d supply, fill #1

## 2022-01-01 MED ORDER — DICYCLOMINE HCL 10 MG PO CAPS
10.0000 mg | ORAL_CAPSULE | Freq: Three times a day (TID) | ORAL | 1 refills | Status: DC | PRN
  Filled 2022-01-01: qty 30, 10d supply, fill #0

## 2022-01-01 NOTE — Telephone Encounter (Signed)
Nurse Assessment Nurse: Eugenio Hoes, RN, Jenny Reichmann Date/Time (Eastern Time): 01/01/2022 2:09:49 PM Confirm and document reason for call. If symptomatic, describe symptoms. ---Caller states that he had open heart surgery and had a vein taken out. Caller states that he has incision to his inner thigh that is getting more swollen and increased pain and pink in color. Has not taken his temperature. Does the patient have any new or worsening symptoms? ---Yes Will a triage be completed? ---Yes Related visit to physician within the last 2 weeks? ---No Does the PT have any chronic conditions? (i.e. diabetes, asthma, this includes High risk factors for pregnancy, etc.) ---Yes List chronic conditions. ---DM, open heart surgery Is this a behavioral health or substance abuse call? ---No Guidelines Guideline Title Affirmed Question Affirmed Notes Nurse Date/Time Eilene Ghazi Time) Post-Op Incision Symptoms and Questions [1] Incision looks infected (spreading redness, pain) AND [2] large red area (> 2 in. or 5 cm) Eugenio Hoes, RN, Cindy 01/01/2022 2:11:49 PM Disp. Time Eilene Ghazi Time) Disposition Final User PLEASE NOTE: All timestamps contained within this report are represented as [place] Standard Time. CONFIDENTIALTY NOTICE: This fax transmission is intended only for the addressee. It contains information that is legally privileged, confidential or otherwise protected from use or disclosure. If you are not the intended recipient, you are strictly prohibited from reviewing, disclosing, copying using or disseminating any of this information or taking any action in reliance on or regarding this information. If you have received this fax in error, please notify us immediately by telephone so that we can arrange for its return to [place]. Phone: [phone], Toll-Free: [phone], Fax: [phone] Page: 2 of 2 Call Id: [id number] 01/01/2022 2:19:58 PM See HCP within 4 Hours (or PCP triage) Yes Eugenio Hoes, RN, [place]  Disagree/Comply Comply Caller Understands Yes PreDisposition Did not know what to do Care Advice Given Per Guideline SEE HCP (OR PCP TRIAGE) WITHIN 4 HOURS: * IF OFFICE WILL BE OPEN: You need to be seen within the next 3 or 4 hours. Call your doctor (or NP/PA) now or as soon as the office opens. CARE ADVICE per Post-Op Incision Symptoms and Questions (Adult) guideline. CALL BACK IF: * You become worse Comments User: Baird Cancer, RN Date/Time Eilene Ghazi Time): 01/01/2022 2:21:03 PM no openings at the office, office advised for patient to contact surgeon or be seen at [facility] or ED

## 2022-01-01 NOTE — Telephone Encounter (Signed)
Cindy from triage called stating that patient needed to be seen within 4 hours.  Patient site in his leg from surgery 2 weeks ago was swollen and warm to the touch.  No openings here for visit now.  Advise that he calls surgeon, go to urgent now, or ER now to be evaluated, cause it sounds like it is infected.  Nurse will advise patient.

## 2022-01-01 NOTE — Telephone Encounter (Signed)
Patient's wife called stating patient has a swollen leg that is warm to the touch. Pt was triaged for further assistance.

## 2022-01-02 ENCOUNTER — Ambulatory Visit: Payer: Medicare HMO | Admitting: Cardiology

## 2022-01-02 ENCOUNTER — Ambulatory Visit: Payer: Medicare HMO

## 2022-01-02 ENCOUNTER — Telehealth: Payer: Self-pay | Admitting: *Deleted

## 2022-01-02 ENCOUNTER — Other Ambulatory Visit (HOSPITAL_BASED_OUTPATIENT_CLINIC_OR_DEPARTMENT_OTHER): Payer: Self-pay

## 2022-01-02 NOTE — Telephone Encounter (Addendum)
Advised triage nurse at triage for patient to call surgeon. Pt did call surgeon.

## 2022-01-02 NOTE — Telephone Encounter (Signed)
Patient contacted the office requesting to cancel his wound assessment with a PA today. Per patient, his legs were swollen and red yesterday. He was advised at that time to continue to elevate his lower extremities and an appt was scheduled. Per patient, since doing so the swelling has decreased. States he has incisional pain. Denies warmth, redness or drainage. Advised patient to contact our office if symptoms worsen. Patient has virtual f/u with Dr. Kipp Brood on Friday.

## 2022-01-03 ENCOUNTER — Ambulatory Visit (INDEPENDENT_AMBULATORY_CARE_PROVIDER_SITE_OTHER): Payer: Self-pay | Admitting: Thoracic Surgery (Cardiothoracic Vascular Surgery)

## 2022-01-03 ENCOUNTER — Other Ambulatory Visit: Payer: Self-pay

## 2022-01-03 ENCOUNTER — Telehealth: Payer: Self-pay

## 2022-01-03 DIAGNOSIS — Z951 Presence of aortocoronary bypass graft: Secondary | ICD-10-CM

## 2022-01-03 NOTE — Telephone Encounter (Signed)
Transition Care Management Follow-up Telephone Call Date of discharge and from where: 12/28/21 from [facility] How have you been since you were released from the hospital? "I been pretty good". Reports one day leg started looking red, but the next day it was better". He reports reached out to provider and no issues now.  Any questions or concerns? No  Items Reviewed: Did the pt receive and understand the discharge instructions provided? Yes  Medications obtained and verified?  Has all his medications except Trinsicon, which he reports will pick up today. Other? No  Any new allergies since your discharge? No  Dietary orders reviewed? Yes Do you have support at home? Yes   Home Care and Equipment/Supplies: Were home health services ordered? no If so, what is the name of the agency? Not applicable  Has the agency set up a time to come to the patient's home? not applicable Were any new equipment or medical supplies ordered?  No What is the name of the medical supply agency? Not applicable Were you able to get the supplies/equipment? not applicable Do you have any questions related to the use of the equipment or supplies? No  Functional Questionnaire: (I = Independent and D = Dependent) ADLs: I  Bathing/Dressing- I  Meal Prep- I  Eating- I  Maintaining continence- I  Transferring/Ambulation- I  Managing Meds- I  Follow up appointments reviewed:  PCP Hospital f/u appt confirmed? Following up with specialist next appointment with PCP 06/03/2022 Specialist Hospital f/u appt confirmed? Yes  Scheduled to see Dr. Berniece Salines on 01/16/22 @ 9 am. Had follow up call from Dr. Kipp Brood on 01/03/22 and will be seen in office. Patient to call Dr. Allison Quarry office to schedule 2 week follow up Are transportation arrangements needed? No  If their condition worsens, is the pt aware to call PCP or go to the Emergency Dept.? Yes Was the patient provided with contact information for  the PCP's office or ED? Yes Was to pt encouraged to call back with questions or concerns? Yes  Thea Silversmith, RN, MSN, BSN, [facility] Care Management Coordinator [phone]

## 2022-01-03 NOTE — Progress Notes (Signed)
°   °  [address]       [place] [address]             [phone]       Patient: Home Provider: Office Consent for Telemedicine visit obtained.  Todays visit was completed via a real-time telehealth (see specific modality noted below). The patient/authorized person provided oral consent at the time of the visit to engage in a telemedicine encounter with the present provider at [facility]. The patient/authorized person was informed of the potential benefits, limitations, and risks of telemedicine. The patient/authorized person expressed understanding that the laws that protect confidentiality also apply to telemedicine. The patient/authorized person acknowledged understanding that telemedicine does not provide emergency services and that he or she would need to call 911 or proceed to the nearest hospital for help if such a need arose.   Total time spent in the clinical discussion 10 minutes.  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  Overall doing well.   Pain is minimal.  Ambulating well. Vitals have been stable.  Ronald Petersen will see [place] back in 1 month with a chest x-ray for cardiac rehab clearance.  Ronald Petersen

## 2022-01-06 ENCOUNTER — Other Ambulatory Visit (HOSPITAL_BASED_OUTPATIENT_CLINIC_OR_DEPARTMENT_OTHER): Payer: Self-pay

## 2022-01-06 ENCOUNTER — Telehealth: Payer: Self-pay | Admitting: *Deleted

## 2022-01-06 ENCOUNTER — Other Ambulatory Visit: Payer: Self-pay | Admitting: *Deleted

## 2022-01-06 MED ORDER — POTASSIUM CHLORIDE CRYS ER 20 MEQ PO TBCR
20.0000 meq | EXTENDED_RELEASE_TABLET | Freq: Every day | ORAL | 0 refills | Status: DC
  Filled 2022-01-06: qty 7, 7d supply, fill #0

## 2022-01-06 MED ORDER — FUROSEMIDE 40 MG PO TABS
40.0000 mg | ORAL_TABLET | Freq: Every day | ORAL | 0 refills | Status: DC
  Filled 2022-01-06: qty 14, 14d supply, fill #0

## 2022-01-06 NOTE — Telephone Encounter (Signed)
-----   Message from Antony Odea, [place] sent at 01/06/2022  4:16 PM EST ----- Regarding: RE: ankle swelling Ciarah Peace, I reviewed the chart. I think Mr. Fendley should resume his Lasix 40mg   and potassium 73mEq daily for 3 days then take them every other day as he was doing prior to surgery.   Thanks. Myron   ----- Message ----- From: Ladon Applebaum, RN Sent: 01/06/2022   2:40 PM EST To: Antony Odea, PA-C Subject: ankle swelling                                 Hey,  Mr. Bogie called today stating his ankles bilaterally are remaining swollen despite elevation. Patient states he is slightly SOB. States ankle swelling tends to move up his leg at night. Denies redness, warmth, pain or drainage at incisions. Patient's lasix was discontinued at hospital discharge. Please advise.   Thanks,  [employer]

## 2022-01-09 ENCOUNTER — Other Ambulatory Visit: Payer: Self-pay | Admitting: Surgical

## 2022-01-10 ENCOUNTER — Other Ambulatory Visit: Payer: Self-pay | Admitting: Surgical

## 2022-01-10 ENCOUNTER — Other Ambulatory Visit (HOSPITAL_BASED_OUTPATIENT_CLINIC_OR_DEPARTMENT_OTHER): Payer: Self-pay

## 2022-01-13 ENCOUNTER — Other Ambulatory Visit (HOSPITAL_COMMUNITY): Payer: Self-pay

## 2022-01-13 ENCOUNTER — Other Ambulatory Visit (HOSPITAL_BASED_OUTPATIENT_CLINIC_OR_DEPARTMENT_OTHER): Payer: Self-pay

## 2022-01-13 DIAGNOSIS — G4733 Obstructive sleep apnea (adult) (pediatric): Secondary | ICD-10-CM | POA: Diagnosis not present

## 2022-01-16 ENCOUNTER — Other Ambulatory Visit: Payer: Self-pay

## 2022-01-16 ENCOUNTER — Other Ambulatory Visit (HOSPITAL_BASED_OUTPATIENT_CLINIC_OR_DEPARTMENT_OTHER): Payer: Self-pay

## 2022-01-16 ENCOUNTER — Ambulatory Visit (INDEPENDENT_AMBULATORY_CARE_PROVIDER_SITE_OTHER): Payer: Medicare HMO | Admitting: Cardiology

## 2022-01-16 ENCOUNTER — Encounter: Payer: Self-pay | Admitting: Cardiology

## 2022-01-16 VITALS — BP 116/60 | HR 62 | Ht 71.0 in | Wt 191.2 lb

## 2022-01-16 DIAGNOSIS — E1169 Type 2 diabetes mellitus with other specified complication: Secondary | ICD-10-CM | POA: Diagnosis not present

## 2022-01-16 DIAGNOSIS — E785 Hyperlipidemia, unspecified: Secondary | ICD-10-CM

## 2022-01-16 DIAGNOSIS — I25118 Atherosclerotic heart disease of native coronary artery with other forms of angina pectoris: Secondary | ICD-10-CM | POA: Diagnosis not present

## 2022-01-16 DIAGNOSIS — I2510 Atherosclerotic heart disease of native coronary artery without angina pectoris: Secondary | ICD-10-CM | POA: Diagnosis not present

## 2022-01-16 DIAGNOSIS — I10 Essential (primary) hypertension: Secondary | ICD-10-CM

## 2022-01-16 DIAGNOSIS — Z9861 Coronary angioplasty status: Secondary | ICD-10-CM

## 2022-01-16 DIAGNOSIS — G4733 Obstructive sleep apnea (adult) (pediatric): Secondary | ICD-10-CM | POA: Diagnosis not present

## 2022-01-16 LAB — BASIC METABOLIC PANEL
BUN/Creatinine Ratio: 22 (ref 10–24)
BUN: 18 mg/dL (ref 8–27)
CO2: 26 mmol/L (ref 20–29)
Calcium: 9.4 mg/dL (ref 8.6–10.2)
Chloride: 104 mmol/L (ref 96–106)
Creatinine, Ser: 0.82 mg/dL (ref 0.76–1.27)
Glucose: 115 mg/dL — ABNORMAL HIGH (ref 70–99)
Potassium: 4.6 mmol/L (ref 3.5–5.2)
Sodium: 141 mmol/L (ref 134–144)
eGFR: 97 mL/min/{1.73_m2} (ref >59)

## 2022-01-16 LAB — CBC
Hematocrit: 35.4 % — ABNORMAL LOW (ref 37.5–51.0)
Hemoglobin: 11.5 g/dL — ABNORMAL LOW (ref 13.0–17.7)
MCH: 27.3 pg (ref 26.6–33.0)
MCHC: 32.5 g/dL (ref 31.5–35.7)
MCV: 84 fL (ref 79–97)
Platelets: 312 10*3/uL (ref 150–450)
RBC: 4.22 x10E6/uL (ref 4.14–5.80)
RDW: 12.8 % (ref 11.6–15.4)
WBC: 6.6 10*3/uL (ref 3.4–10.8)

## 2022-01-16 LAB — MAGNESIUM: Magnesium: 2 mg/dL (ref 1.6–2.3)

## 2022-01-16 MED ORDER — LIDOCAINE 5 % EX PTCH
1.0000 | MEDICATED_PATCH | CUTANEOUS | 0 refills | Status: DC
  Filled 2022-01-16: qty 5, 5d supply, fill #0

## 2022-01-16 MED ORDER — FUROSEMIDE 40 MG PO TABS
40.0000 mg | ORAL_TABLET | Freq: Every day | ORAL | 1 refills | Status: DC
  Filled 2022-01-16 – 2022-01-23 (×2): qty 90, 90d supply, fill #0

## 2022-01-16 MED ORDER — METOPROLOL SUCCINATE ER 25 MG PO TB24
12.5000 mg | ORAL_TABLET | Freq: Every day | ORAL | 1 refills | Status: DC
  Filled 2022-01-16: qty 45, 90d supply, fill #0
  Filled 2022-04-16: qty 45, 90d supply, fill #1

## 2022-01-16 NOTE — Progress Notes (Signed)
Cardiology Office Note:    Date:  01/16/2022   ID:  Ronald Petersen, DOB 1956-03-30, MRN [id number]  PCP:  [name], Alferd Apa, DO  Cardiologist:  Berniece Salines, DO  Electrophysiologist:  None   Referring MD: [name], Alferd Apa, *   " I am nit feeling too good, I have leg swelling and shortness of breath"  History of Present Illness:    Ronald Petersen is a 66 y.o. 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    In December he was seen at the emergency department [facility] he subsequently underwent a left heart catheterization given the fact that he had had intermittent worsening chest pain.    I saw the patient on December 30, 2018 at that time he was willing to get back on the Imdur but he tells me he started when he started to feel tired in lots of headache so he had stopped this medication.  He continue his Ranexa.   I saw the patient on March 05, 2021 at that time he had been experiencing shortness of breath.  I started him on Lasix 40 mg every other day to see if this is going to help.  Said I saw him his sleep study reported obstructive sleep apnea and was pending CPAP titration.  I saw the patient on December 09, 2021 at that time he was experiencing chest discomfort given high suspicion for angina and sent the patient to the hospital for left heart catheterization.  He had his catheterization which showed 40% left main stenosis with ostial proximal LAD which was 95%, proximal to mid LAD stent were widely patent, and RPAV lesion is  50% stenosed.  He was then referred for CABG.  He is now post CABG.  He is improving postsurgically but notes that he is experiencing some shortness of breath and leg swelling.  He does have some chest wall pain which is left-sided reproducible and tender.  No other complaints.  Past Medical History:  Diagnosis Date   Anxiety    Arthritis    Cancer ([place])    Complication of anesthesia    aspirated with back surgery at age 65   Depression    Diabetes mellitus Type II    GERD (gastroesophageal reflux disease)    Hyperlipidemia    Hypertension    Neuromuscular disorder ([place])    NEUROPATHY   Right rotator cuff tear 11/23/2018   Sleep apnea    no CPAP    Past Surgical History:  Procedure Laterality Date   APPENDECTOMY     ARTHOSCOPIC ROTAOR CUFF REPAIR Right 11/23/2018   Procedure: ARTHROSCOPIC ROTATOR CUFF REPAIR;  Surgeon: [name], MD;  Location: [place];  Service: Orthopedics;  Laterality: Right;   CHOLECYSTECTOMY     CORONARY ARTERY BYPASS GRAFT N/A 12/24/2021   Procedure: CORONARY ARTERY BYPASS GRAFTING (CABG), ON PUMP, TIMES TWO, USING LEFT INTERNAL MAMMARY ARTERY AND RIGHT ENDOSCOPICALLY HARVESTED GREATER SAPHENOUS VEIN;  Surgeon: Lajuana Matte,  MD;  Location: [facility];  Service: Open Heart Surgery;  Laterality: N/A;   CORONARY STENT INTERVENTION N/A 11/19/2020   Procedure: CORONARY STENT INTERVENTION;  Surgeon: Nelva Bush, MD;  Location: [place] [facility];  Service: Cardiovascular;  Laterality: N/A;   ENDOVEIN HARVEST OF GREATER SAPHENOUS VEIN Right 12/24/2021   Procedure: ENDOVEIN HARVEST OF GREATER SAPHENOUS VEIN;  Surgeon: Lajuana Matte, MD;  Location: [place];  Service: Open Heart Surgery;  Laterality: Right;   INTRAVASCULAR ULTRASOUND/IVUS N/A 11/19/2020   Procedure: Intravascular Ultrasound/IVUS;  Surgeon: Nelva Bush, MD;  Location: [place] [facility];  Service: Cardiovascular;  Laterality: N/A;   LEFT HEART CATH AND CORONARY  ANGIOGRAPHY N/A 11/19/2020   Procedure: LEFT HEART CATH AND CORONARY ANGIOGRAPHY;  Surgeon: Nelva Bush, MD;  Location: [place] [facility];  Service: Cardiovascular;  Laterality: N/A;   LEFT HEART CATH AND CORONARY ANGIOGRAPHY N/A 12/24/2020   Procedure: LEFT HEART CATH AND CORONARY ANGIOGRAPHY;  Surgeon: Martinique, Peter M, MD;  Location: [place] [facility];  Service: Cardiovascular;  Laterality: N/A;   LEFT HEART CATH AND CORONARY ANGIOGRAPHY N/A 12/16/2021   Procedure: LEFT HEART CATH AND CORONARY ANGIOGRAPHY;  Surgeon: Leonie Man, MD;  Location: [place] [facility];  Service: Cardiovascular;  Laterality: N/A;   LUMBAR LAMINECTOMY     SHOULDER ARTHROSCOPY WITH ROTATOR CUFF REPAIR AND SUBACROMIAL DECOMPRESSION Right 11/23/2018   Procedure: SHOULDER ARTHROSCOPY WITH ROTATOR CUFF REPAIR AND SUBACROMIAL DECOMPRESSION;  Surgeon: [name], MD;  Location: [place];  Service: Orthopedics;  Laterality: Right;   TEE WITHOUT CARDIOVERSION N/A 12/24/2021   Procedure: TRANSESOPHAGEAL ECHOCARDIOGRAM (TEE);  Surgeon: Lajuana Matte, MD;  Location: [place];  Service: Open Heart Surgery;  Laterality: N/A;    Current Medications: Current Meds  Medication Sig   acetaminophen (TYLENOL) 650 MG CR tablet Take 1,300 mg by mouth every 8 (eight) hours as needed for pain.   aspirin EC 325 MG EC tablet Take 1 tablet (325 mg total) by mouth daily.   Continuous Blood Gluc Receiver (FREESTYLE LIBRE 2 READER) DEVI Use as directed to check blood sugar   Continuous Blood Gluc Sensor (FREESTYLE LIBRE 2 SENSOR) MISC Use to check blood sugar, change every 14 days   dicyclomine (BENTYL) 10 MG capsule Take 1 capsule (10 mg total) by mouth every 8 (eight) hours as needed for spasms.   ELDERBERRY PO Take 300 mg by mouth daily.   empagliflozin (JARDIANCE) 10 MG TABS tablet Take 1 tablet (10 mg total) by mouth daily.   fenofibrate 160 MG tablet TAKE 1 TABLET (160 MG TOTAL) BY MOUTH DAILY.    ferrous [id number] C-folic acid (TRINSICON / FOLTRIN) capsule Take 1 capsule by mouth 2 (two) times daily after a meal.   gabapentin (NEURONTIN) 800 MG tablet Take 1 tablet (800 mg total) by mouth 3 (three) times daily.   glucose blood test strip USE AS INSTRUCTED TO CHECK BLOOD SUGAR ONCE A DAY   lidocaine (LIDODERM) 5 % Place 1 patch onto the skin daily. Remove & Discard patch within 12 hours or as directed by MD   Magnesium Citrate 200 MG TABS Take 400 mg by mouth daily.   metoprolol succinate (TOPROL XL) 25 MG 24 hr tablet Take 1/2 tablet (12.5 mg total) by mouth daily.   ondansetron (ZOFRAN) 4 MG tablet Take 1 tablet (4 mg total) by mouth every 8 (eight) hours as needed.   pantoprazole (PROTONIX) 40 MG tablet TAKE 1 TABLET (40 MG TOTAL) BY  MOUTH ONCE A DAY   polyethylene glycol powder (GLYCOLAX/MIRALAX) 17 GM/SCOOP powder Take 17 g by mouth 2 (two) times daily as needed for moderate constipation.   potassium chloride SA (KLOR-CON M) 20 MEQ tablet Take 1 tablet by mouth daily for three days then resume taking 1 tablet every other day   rosuvastatin (CRESTOR) 40 MG tablet TAKE 1 TABLET BY MOUTH ONCE DAILY   sertraline (ZOLOFT) 100 MG tablet TAKE ONE AND ONE-HALF TABLET BY MOUTH ONCE DAILY   tiZANidine (ZANAFLEX) 4 MG tablet TAKE 1 TABLET (4 MG TOTAL) BY MOUTH EVERY 6 (SIX) HOURS AS NEEDED FOR MUSCLE SPASMS.   traZODone (DESYREL) 50 MG tablet Take 1 tablet (50 mg total) by mouth at bedtime.   vitamin E 180 MG (400 UNITS) capsule Take 1,600 Units by mouth daily.   [DISCONTINUED] furosemide (LASIX) 40 MG tablet Take 1 tablet by mouth daily for three days then resume taking 1 tablet every other day   [DISCONTINUED] metoprolol tartrate (LOPRESSOR) 25 MG tablet Take 1/2 tablet (12.5 mg total) by mouth 2 (two) times daily.     Allergies:   Niacin   Social History   Socioeconomic History   Marital status: Married    Spouse name: Lorriane Shire   Number of children: 2   Years of education:  Not on file   Highest education level: Not on file  Occupational History   Occupation: self employed    Employer: UNEMPLOYED  Tobacco Use   Smoking status: Former    Years: 16.00    Types: Cigarettes    Quit date: 12/29/1985    Years since quitting: 36.0   Smokeless tobacco: Never  Vaping Use   Vaping Use: Never used  Substance and Sexual Activity   Alcohol use: Yes    Alcohol/week: 4.0 - 5.0 standard drinks    Types: 4 - 5 Cans of beer per week    Comment: 5 times per week   Drug use: No   Sexual activity: Yes    Partners: Female  Other Topics Concern   Not on file  Social History Narrative   Exercise-- 3 days   Pt has hs degree   Right handed   One story home   Drinks caffeine   Social Determinants of Health   Financial Resource Strain: Not on file  Food Insecurity: Not on file  Transportation Needs: Not on file  Physical Activity: Not on file  Stress: Not on file  Social Connections: Not on file     Family History: The patient's family history includes Alcohol abuse in an other family member; Arthritis in an other family member; COPD in his mother; Coronary artery disease in an other family member; Depression in an other family member; Heart disease in his sister and sister; Hypertension in an other family member; Ovarian cancer in his sister and other family members; Stomach cancer in his maternal grandmother and sister; Stroke in his father. There is no history of Colon polyps or Colon cancer.  ROS:   Review of Systems  Constitution: Negative for decreased appetite, fever and weight gain.  HENT: Negative for congestion, ear discharge, hoarse voice and sore throat.   Eyes: Negative for discharge, redness, vision loss in right eye and visual halos.  Cardiovascular: Negative for chest pain, dyspnea on exertion, leg swelling, orthopnea and palpitations.  Respiratory: Negative for cough, hemoptysis, shortness of breath and snoring.   Endocrine: Negative for heat  intolerance and polyphagia.  Hematologic/Lymphatic: Negative for bleeding problem. Does  not bruise/bleed easily.  Skin: Negative for flushing, nail changes, rash and suspicious lesions.  Musculoskeletal: Negative for arthritis, joint pain, muscle cramps, myalgias, neck pain and stiffness.  Gastrointestinal: Negative for abdominal pain, bowel incontinence, diarrhea and excessive appetite.  Genitourinary: Negative for decreased libido, genital sores and incomplete emptying.  Neurological: Negative for brief paralysis, focal weakness, headaches and loss of balance.  Psychiatric/Behavioral: Negative for altered mental status, depression and suicidal ideas.  Allergic/Immunologic: Negative for HIV exposure and persistent infections.    EKGs/Labs/Other Studies Reviewed:    The following studies were reviewed today:  LHC 12/16/2021   Mid LM to Ost LAD lesion is 40% stenosed with 40% stenosed side branch in Ost Cx.   Ost LAD to Prox LAD lesion is 95% stenosed.   Prox LAD to Mid LAD DES Stent is widely patent   Prox Cx- 1st Mrg DES STENT is widely patent   1st Diag lesion is 25% stenosed.   RPAV lesion is 50% stenosed.   -----------------------------------   The left ventricular systolic function is normal.  The left ventricular ejection fraction is 55-65% by visual estimate.   LV end diastolic pressure is normal.   There is no aortic valve stenosis.   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 ->   Location of the stenosis and involvement of left main makes PCI of the LAD suboptimal especially in the patient who has pending surgery.  Would not be willing to stop antiplatelet agent after 3 to 6 months in the setting of Left Main Stenting. Only minimal RCA disease as well as downstream LCx and LAD disease. Preserved LVEF normal EDP.   RECOMMENDATION Based on significance of  ostial left main disease and level of his symptoms, I feel it is safer to admit him to the hospital for CVTS consultation.  We will start IV heparin. Titrate home dose of statin Restart BP meds Sliding scale insulin      TTE 12/16/2021 IMPRESSIONS   1. Left ventricular ejection fraction, by estimation, is 60 to 65%. The  left ventricle has normal function. The left ventricle has no regional  wall motion abnormalities. There is moderate asymmetric left ventricular  hypertrophy of the basal-septal  segment. Left ventricular diastolic parameters are indeterminate.   2. Right ventricular systolic function is normal. The right ventricular  size is normal. There is mildly elevated pulmonary artery systolic  pressure. The estimated right ventricular systolic pressure is 60.6 mmHg.   3. Left atrial size was mildly dilated.   4. The mitral valve is normal in structure. Trivial mitral valve  regurgitation. No evidence of mitral stenosis.   5. The aortic valve is tricuspid. Aortic valve regurgitation is trivial.  Mild aortic valve stenosis. Vmax 2.2 m/s, MG 81mmHg, AVA 2.0 cm^2, DI 0.54   6. The inferior vena cava is dilated in size with <50% respiratory  variability, suggesting right atrial pressure of 15 mmHg.   FINDINGS   Left Ventricle: Left ventricular ejection fraction, by estimation, is 60  to 65%. The left ventricle has normal function. The left ventricle has no  regional wall motion abnormalities. The left ventricular internal cavity  size was normal in size. There is   moderate asymmetric left ventricular hypertrophy of the basal-septal  segment. Left ventricular diastolic parameters are indeterminate.   Right Ventricle: The right ventricular size is normal. No increase in  right ventricular  wall thickness. Right ventricular systolic function is  normal. There is mildly elevated pulmonary artery systolic pressure. The  tricuspid regurgitant velocity is 2.35   m/s, and with an  assumed right atrial pressure of 15 mmHg, the estimated  right ventricular systolic pressure is 74.2 mmHg.   Left Atrium: Left atrial size was mildly dilated.   Right Atrium: Right atrial size was normal in size.   Pericardium: There is no evidence of pericardial effusion.   Mitral Valve: The mitral valve is normal in structure. Trivial mitral  valve regurgitation. No evidence of mitral valve stenosis.   Tricuspid Valve: The tricuspid valve is normal in structure. Tricuspid  valve regurgitation is trivial.   Aortic Valve: The aortic valve is tricuspid. Aortic valve regurgitation is  trivial. Mild aortic stenosis is present. Aortic valve mean gradient  measures 10.0 mmHg. Aortic valve peak gradient measures 18.1 mmHg. Aortic  valve area, by VTI measures 2.28  cm.   Pulmonic Valve: The pulmonic valve was not well visualized. Pulmonic valve  regurgitation is trivial.   Aorta: The aortic root and ascending aorta are structurally normal, with  no evidence of dilitation.   Venous: The inferior vena cava is dilated in size with less than 50%  respiratory variability, suggesting right atrial pressure of 15 mmHg.   IAS/Shunts: The interatrial septum was not well visualized.   EKG: None today Recent Labs: 02/26/2021: TSH 1.82 12/02/2021: ALT 12 12/25/2021: Magnesium 2.2 12/28/2021: BUN 20; Creatinine, Ser 0.83; Hemoglobin 8.8; Platelets 140; Potassium 4.0; Sodium 139  Recent Lipid Panel    Component Value Date/Time   CHOL 105 12/02/2021 1508   TRIG 111.0 12/02/2021 1508   TRIG 148 12/10/2006 1603   HDL 46.40 12/02/2021 1508   CHOLHDL 2 12/02/2021 1508   VLDL 22.2 12/02/2021 1508   LDLCALC 36 12/02/2021 1508   LDLCALC 96 09/06/2020 1138   LDLDIRECT 93.0 05/31/2020 0910    Physical Exam:    VS:  BP 116/60    Pulse 62    Ht 5\' 11"  (1.803 m)    Wt 191 lb 3.2 oz (86.7 kg)    SpO2 100%    BMI 26.67 kg/m     Wt Readings from Last 3 Encounters:  01/16/22 191 lb 3.2 oz (86.7 kg)   12/28/21 181 lb 11.2 oz (82.4 kg)  12/09/21 186 lb (84.4 kg)     GEN: Well nourished, well developed in no acute distress HEENT: Normal NECK: No JVD; No carotid bruits LYMPHATICS: No lymphadenopathy CARDIAC: S1S2 noted,RRR, no murmurs, rubs, gallops RESPIRATORY:  Clear to auscultation without rales, wheezing or rhonchi  ABDOMEN: Soft, non-tender, non-distended, +bowel sounds, no guarding. EXTREMITIES: Bilateral +1 lower extremity edema, No cyanosis, no clubbing MUSCULOSKELETAL:  No deformity  SKIN: Warm and dry NEUROLOGIC:  Alert and oriented x 3, non-focal PSYCHIATRIC:  Normal affect, good insight  ASSESSMENT:    1. Coronary artery disease of native artery of native heart with stable angina pectoris ([place])   2. Essential hypertension   3. CAD S/P DES PCI LAD & LCx-OM   4. OSA (obstructive sleep apnea)   5. Hyperlipidemia associated with type 2 diabetes mellitus ([place])    PLAN:    Shortness of breath-he is experiencing some shortness of breath and leg swelling I am going to increase his Lasix to 40 mg daily.  We will get blood work today as well.  Musculoskeletal chest wall pain-we will give lidocaine patch x5.  CAD-no anginal symptoms.  He is recovering from  his surgery.  We will continue him on his current medication with aspirin, statin as well as low-dose beta-blocker.  He is also following up with CT surgery.  Pending cardiac rehab.  He is interested in this.  Blood pressure is acceptable, continue with current antihypertensive regimen.  Hyperlipidemia - continue with current statin medication.  Diabetes mellitus-insert diabetes  The patient is in agreement with the above plan. The patient left the office in stable condition.  The patient will follow up in weeks or sooner if needed.   Medication Adjustments/Labs and Tests Ordered: Current medicines are reviewed at length with the patient today.  Concerns regarding medicines are outlined above.  Orders Placed This  Encounter  Procedures   Basic metabolic panel   CBC   Magnesium   Meds ordered this encounter  Medications   furosemide (LASIX) 40 MG tablet    Sig: Take 1 tablet (40 mg total) by mouth daily.    Dispense:  90 tablet    Refill:  1   metoprolol succinate (TOPROL XL) 25 MG 24 hr tablet    Sig: Take 1/2 tablet (12.5 mg total) by mouth daily.    Dispense:  45 tablet    Refill:  1   lidocaine (LIDODERM) 5 %    Sig: Place 1 patch onto the skin daily. Remove & Discard patch within 12 hours or as directed by MD    Dispense:  5 patch    Refill:  0    Patient Instructions  Medication Instructions:   -Stop metoprolol tartrate (lopressor).  -Start metoprolol succinate (toprol-xl) 12.5mg  once daily.  -Increase furosemide (lasix) 40mg  once daily.  -Lidocaine patch for incisional pain.   *If you need a refill on your cardiac medications before your next appointment, please call your pharmacy*   Lab Work: Your physician recommends that you have labs drawn today: BMET, Mag, CBC  If you have labs (blood work) drawn today and your tests are completely normal, you will receive your results only by: MyChart Message (if you have MyChart) OR A paper copy in the mail If you have any lab test that is abnormal or we need to change your treatment, we will call you to review the results.   Follow-Up: At [facility], you and your health needs are our priority.  As part of our continuing mission to provide you with exceptional heart care, we have created designated Provider Care Teams.  These Care Teams include your primary Cardiologist (physician) and Advanced Practice Providers (APPs -  Physician Assistants and Nurse Practitioners) who all work together to provide you with the care you need, when you need it.  We recommend signing up for the patient portal called "MyChart".  Sign up information is provided on this After Visit Summary.  MyChart is used to connect with patients for Virtual  Visits (Telemedicine).  Patients are able to view lab/test results, encounter notes, upcoming appointments, etc.  Non-urgent messages can be sent to your provider as well.   To learn more about what you can do with MyChart, go to [email or web address].    Your next appointment:   8 week(s)  The format for your next appointment:   In Person  Provider:   Berniece Salines, DO   Adopting a Healthy Lifestyle.  Know what a healthy weight is for you (roughly BMI <25) and aim to maintain this   Aim for 7+ servings of fruits and vegetables daily   65-80+ fluid ounces of water or unsweet tea  for healthy kidneys   Limit to max 1 drink of alcohol per day; avoid smoking/tobacco   Limit animal fats in diet for cholesterol and heart health - choose grass fed whenever available   Avoid highly processed foods, and foods high in saturated/trans fats   Aim for low stress - take time to unwind and care for your mental health   Aim for 150 min of moderate intensity exercise weekly for heart health, and weights twice weekly for bone health   Aim for 7-9 hours of sleep daily   When it comes to diets, agreement about the perfect plan isnt easy to find, even among the experts. Experts at the [employer] developed an idea known as the Healthy Eating Plate. Just imagine a plate divided into logical, healthy portions.   The emphasis is on diet quality:   Load up on vegetables and fruits - one-half of your plate: Aim for color and variety, and remember that potatoes dont count.   Go for whole grains - one-quarter of your plate: Whole wheat, barley, wheat berries, quinoa, oats, brown rice, and foods made with them. If you want pasta, go with whole wheat pasta.   Protein power - one-quarter of your plate: Fish, chicken, beans, and nuts are all healthy, versatile protein sources. Limit red meat.   The diet, however, does go beyond the plate, offering a few other suggestions.   Use  healthy plant oils, such as olive, canola, soy, corn, sunflower and peanut. Check the labels, and avoid partially hydrogenated oil, which have unhealthy trans fats.   If youre thirsty, drink water. Coffee and tea are good in moderation, but skip sugary drinks and limit milk and dairy products to one or two daily servings.   The type of carbohydrate in the diet is more important than the amount. Some sources of carbohydrates, such as vegetables, fruits, whole grains, and beans-are healthier than others.   Finally, stay active  Signed, Berniece Salines, DO  01/16/2022 9:35 AM    [place]

## 2022-01-16 NOTE — Patient Instructions (Signed)
Medication Instructions:   -Stop metoprolol tartrate (lopressor).  -Start metoprolol succinate (toprol-xl) 12.5mg  once daily.  -Increase furosemide (lasix) 40mg  once daily.  -Lidocaine patch for incisional pain.   *If you need a refill on your cardiac medications before your next appointment, please call your pharmacy*   Lab Work: Your physician recommends that you have labs drawn today: BMET, Mag, CBC  If you have labs (blood work) drawn today and your tests are completely normal, you will receive your results only by: MyChart Message (if you have MyChart) OR A paper copy in the mail If you have any lab test that is abnormal or we need to change your treatment, we will call you to review the results.   Follow-Up: At [facility], you and your health needs are our priority.  As part of our continuing mission to provide you with exceptional heart care, we have created designated Provider Care Teams.  These Care Teams include your primary Cardiologist (physician) and Advanced Practice Providers (APPs -  Physician Assistants and Nurse Practitioners) who all work together to provide you with the care you need, when you need it.  We recommend signing up for the patient portal called "MyChart".  Sign up information is provided on this After Visit Summary.  MyChart is used to connect with patients for Virtual Visits (Telemedicine).  Patients are able to view lab/test results, encounter notes, upcoming appointments, etc.  Non-urgent messages can be sent to your provider as well.   To learn more about what you can do with MyChart, go to [email or web address].    Your next appointment:   8 week(s)  The format for your next appointment:   In Person  Provider:   Berniece Salines, DO

## 2022-01-17 ENCOUNTER — Other Ambulatory Visit (HOSPITAL_BASED_OUTPATIENT_CLINIC_OR_DEPARTMENT_OTHER): Payer: Self-pay

## 2022-01-20 ENCOUNTER — Other Ambulatory Visit: Payer: Self-pay | Admitting: Family Medicine

## 2022-01-20 ENCOUNTER — Other Ambulatory Visit (HOSPITAL_BASED_OUTPATIENT_CLINIC_OR_DEPARTMENT_OTHER): Payer: Self-pay

## 2022-01-20 DIAGNOSIS — G4700 Insomnia, unspecified: Secondary | ICD-10-CM

## 2022-01-20 MED ORDER — TRAZODONE HCL 50 MG PO TABS
50.0000 mg | ORAL_TABLET | Freq: Every evening | ORAL | 3 refills | Status: DC | PRN
  Filled 2022-01-20: qty 30, 30d supply, fill #0
  Filled 2022-02-18: qty 30, 30d supply, fill #1
  Filled 2022-03-15: qty 30, 30d supply, fill #2
  Filled 2022-04-22: qty 30, 30d supply, fill #3

## 2022-01-21 ENCOUNTER — Other Ambulatory Visit (HOSPITAL_BASED_OUTPATIENT_CLINIC_OR_DEPARTMENT_OTHER): Payer: Self-pay

## 2022-01-22 ENCOUNTER — Other Ambulatory Visit (HOSPITAL_BASED_OUTPATIENT_CLINIC_OR_DEPARTMENT_OTHER): Payer: Self-pay

## 2022-01-23 ENCOUNTER — Other Ambulatory Visit: Payer: Self-pay | Admitting: Internal Medicine

## 2022-01-23 ENCOUNTER — Other Ambulatory Visit (HOSPITAL_BASED_OUTPATIENT_CLINIC_OR_DEPARTMENT_OTHER): Payer: Self-pay

## 2022-01-23 MED ORDER — ONETOUCH VERIO VI STRP
ORAL_STRIP | 12 refills | Status: DC
  Filled 2022-01-23: qty 100, 90d supply, fill #0
  Filled 2022-04-22: qty 100, 90d supply, fill #1
  Filled 2022-07-26: qty 100, 90d supply, fill #2
  Filled 2022-10-20: qty 100, 90d supply, fill #3
  Filled 2023-01-12: qty 100, 90d supply, fill #4

## 2022-01-23 MED FILL — Sertraline HCl Tab 100 MG: ORAL | 90 days supply | Qty: 135 | Fill #2 | Status: AC

## 2022-01-24 ENCOUNTER — Emergency Department (HOSPITAL_BASED_OUTPATIENT_CLINIC_OR_DEPARTMENT_OTHER): Payer: Medicare HMO

## 2022-01-24 ENCOUNTER — Observation Stay (HOSPITAL_BASED_OUTPATIENT_CLINIC_OR_DEPARTMENT_OTHER)
Admission: EM | Admit: 2022-01-24 | Discharge: 2022-01-25 | Disposition: A | Discharge status: Home / Self Care | Payer: Medicare HMO | Attending: Internal Medicine | Admitting: Internal Medicine

## 2022-01-24 ENCOUNTER — Other Ambulatory Visit: Payer: Self-pay

## 2022-01-24 ENCOUNTER — Encounter (HOSPITAL_BASED_OUTPATIENT_CLINIC_OR_DEPARTMENT_OTHER): Payer: Self-pay

## 2022-01-24 ENCOUNTER — Other Ambulatory Visit (HOSPITAL_BASED_OUTPATIENT_CLINIC_OR_DEPARTMENT_OTHER): Payer: Self-pay

## 2022-01-24 DIAGNOSIS — G4730 Sleep apnea, unspecified: Secondary | ICD-10-CM | POA: Diagnosis present

## 2022-01-24 DIAGNOSIS — Z955 Presence of coronary angioplasty implant and graft: Secondary | ICD-10-CM

## 2022-01-24 DIAGNOSIS — R079 Chest pain, unspecified: Secondary | ICD-10-CM | POA: Diagnosis not present

## 2022-01-24 DIAGNOSIS — K219 Gastro-esophageal reflux disease without esophagitis: Secondary | ICD-10-CM | POA: Diagnosis present

## 2022-01-24 DIAGNOSIS — J90 Pleural effusion, not elsewhere classified: Principal | ICD-10-CM | POA: Diagnosis present

## 2022-01-24 DIAGNOSIS — E1165 Type 2 diabetes mellitus with hyperglycemia: Secondary | ICD-10-CM

## 2022-01-24 DIAGNOSIS — Z951 Presence of aortocoronary bypass graft: Secondary | ICD-10-CM | POA: Diagnosis not present

## 2022-01-24 DIAGNOSIS — Z66 Do not resuscitate: Secondary | ICD-10-CM | POA: Diagnosis present

## 2022-01-24 DIAGNOSIS — Z825 Family history of asthma and other chronic lower respiratory diseases: Secondary | ICD-10-CM | POA: Diagnosis not present

## 2022-01-24 DIAGNOSIS — I25110 Atherosclerotic heart disease of native coronary artery with unstable angina pectoris: Secondary | ICD-10-CM | POA: Diagnosis present

## 2022-01-24 DIAGNOSIS — I25810 Atherosclerosis of coronary artery bypass graft(s) without angina pectoris: Secondary | ICD-10-CM | POA: Diagnosis not present

## 2022-01-24 DIAGNOSIS — Z800 Family history of malignant neoplasm of digestive organs: Secondary | ICD-10-CM | POA: Diagnosis not present

## 2022-01-24 DIAGNOSIS — R0600 Dyspnea, unspecified: Secondary | ICD-10-CM | POA: Diagnosis not present

## 2022-01-24 DIAGNOSIS — Z1152 Encounter for screening for COVID-19: Secondary | ICD-10-CM | POA: Insufficient documentation

## 2022-01-24 DIAGNOSIS — R0789 Other chest pain: Secondary | ICD-10-CM | POA: Diagnosis present

## 2022-01-24 DIAGNOSIS — Z7984 Long term (current) use of oral hypoglycemic drugs: Secondary | ICD-10-CM

## 2022-01-24 DIAGNOSIS — E119 Type 2 diabetes mellitus without complications: Secondary | ICD-10-CM

## 2022-01-24 DIAGNOSIS — I700 Atherosclerosis of aorta: Secondary | ICD-10-CM | POA: Diagnosis not present

## 2022-01-24 DIAGNOSIS — Z7982 Long term (current) use of aspirin: Secondary | ICD-10-CM | POA: Diagnosis not present

## 2022-01-24 DIAGNOSIS — T501X6A Underdosing of loop [high-ceiling] diuretics, initial encounter: Secondary | ICD-10-CM | POA: Diagnosis present

## 2022-01-24 DIAGNOSIS — F32A Depression, unspecified: Secondary | ICD-10-CM | POA: Diagnosis not present

## 2022-01-24 DIAGNOSIS — Z818 Family history of other mental and behavioral disorders: Secondary | ICD-10-CM | POA: Diagnosis not present

## 2022-01-24 DIAGNOSIS — Z87891 Personal history of nicotine dependence: Secondary | ICD-10-CM | POA: Diagnosis not present

## 2022-01-24 DIAGNOSIS — Z823 Family history of stroke: Secondary | ICD-10-CM

## 2022-01-24 DIAGNOSIS — I2510 Atherosclerotic heart disease of native coronary artery without angina pectoris: Secondary | ICD-10-CM | POA: Diagnosis not present

## 2022-01-24 DIAGNOSIS — Z888 Allergy status to other drugs, medicaments and biological substances status: Secondary | ICD-10-CM

## 2022-01-24 DIAGNOSIS — I3139 Other pericardial effusion (noninflammatory): Secondary | ICD-10-CM | POA: Diagnosis not present

## 2022-01-24 DIAGNOSIS — I200 Unstable angina: Secondary | ICD-10-CM

## 2022-01-24 DIAGNOSIS — E1142 Type 2 diabetes mellitus with diabetic polyneuropathy: Secondary | ICD-10-CM | POA: Diagnosis present

## 2022-01-24 DIAGNOSIS — Z79899 Other long term (current) drug therapy: Secondary | ICD-10-CM

## 2022-01-24 DIAGNOSIS — F419 Anxiety disorder, unspecified: Secondary | ICD-10-CM | POA: Diagnosis present

## 2022-01-24 DIAGNOSIS — E785 Hyperlipidemia, unspecified: Secondary | ICD-10-CM | POA: Diagnosis not present

## 2022-01-24 DIAGNOSIS — Z8041 Family history of malignant neoplasm of ovary: Secondary | ICD-10-CM | POA: Diagnosis not present

## 2022-01-24 DIAGNOSIS — Z20822 Contact with and (suspected) exposure to covid-19: Secondary | ICD-10-CM | POA: Diagnosis present

## 2022-01-24 DIAGNOSIS — I10 Essential (primary) hypertension: Secondary | ICD-10-CM | POA: Diagnosis not present

## 2022-01-24 DIAGNOSIS — Z8249 Family history of ischemic heart disease and other diseases of the circulatory system: Secondary | ICD-10-CM | POA: Diagnosis not present

## 2022-01-24 DIAGNOSIS — Y92009 Unspecified place in unspecified non-institutional (private) residence as the place of occurrence of the external cause: Secondary | ICD-10-CM | POA: Diagnosis not present

## 2022-01-24 DIAGNOSIS — J9811 Atelectasis: Secondary | ICD-10-CM | POA: Diagnosis present

## 2022-01-24 DIAGNOSIS — E11649 Type 2 diabetes mellitus with hypoglycemia without coma: Secondary | ICD-10-CM

## 2022-01-24 DIAGNOSIS — Z9049 Acquired absence of other specified parts of digestive tract: Secondary | ICD-10-CM

## 2022-01-24 DIAGNOSIS — R69 Illness, unspecified: Secondary | ICD-10-CM | POA: Diagnosis not present

## 2022-01-24 HISTORY — DX: Atherosclerotic heart disease of native coronary artery without angina pectoris: I25.10

## 2022-01-24 LAB — CBC
HCT: 44.7 % (ref 39.0–52.0)
Hemoglobin: 14.3 g/dL (ref 13.0–17.0)
MCH: 27.2 pg (ref 26.0–34.0)
MCHC: 32 g/dL (ref 30.0–36.0)
MCV: 85.1 fL (ref 80.0–100.0)
Platelets: 286 10*3/uL (ref 150–400)
RBC: 5.25 MIL/uL (ref 4.22–5.81)
RDW: 13.5 % (ref 11.5–15.5)
WBC: 7.3 10*3/uL (ref 4.0–10.5)
nRBC: 0 % (ref 0.0–0.2)

## 2022-01-24 LAB — BASIC METABOLIC PANEL
Anion gap: 10 (ref 5–15)
BUN: 29 mg/dL — ABNORMAL HIGH (ref 8–23)
CO2: 27 mmol/L (ref 22–32)
Calcium: 9.6 mg/dL (ref 8.9–10.3)
Chloride: 99 mmol/L (ref 98–111)
Creatinine, Ser: 1.03 mg/dL (ref 0.61–1.24)
GFR, Estimated: >60 mL/min (ref >60)
Glucose, Bld: 103 mg/dL — ABNORMAL HIGH (ref 70–99)
Potassium: 4.2 mmol/L (ref 3.5–5.1)
Sodium: 136 mmol/L (ref 135–145)

## 2022-01-24 LAB — BRAIN NATRIURETIC PEPTIDE: B Natriuretic Peptide: 84.1 pg/mL (ref 0.0–100.0)

## 2022-01-24 LAB — TROPONIN I (HIGH SENSITIVITY)
Troponin I (High Sensitivity): 4 ng/L (ref <18)
Troponin I (High Sensitivity): 5 ng/L (ref <18)

## 2022-01-24 LAB — RESP PANEL BY RT-PCR (FLU A&B, COVID) ARPGX2
Influenza A by PCR: NEGATIVE
Influenza B by PCR: NEGATIVE
SARS Coronavirus 2 by RT PCR: NEGATIVE

## 2022-01-24 LAB — MAGNESIUM: Magnesium: 2.2 mg/dL (ref 1.7–2.4)

## 2022-01-24 MED ORDER — ACETAMINOPHEN 325 MG PO TABS
650.0000 mg | ORAL_TABLET | Freq: Once | ORAL | Status: AC
Start: 2022-01-24 — End: 2022-01-24
  Administered 2022-01-24: 650 mg via ORAL
  Filled 2022-01-24: qty 2

## 2022-01-24 MED ORDER — IOHEXOL 300 MG/ML  SOLN
80.0000 mL | Freq: Once | INTRAMUSCULAR | Status: AC | PRN
  Administered 2022-01-24: 80 mL via INTRAVENOUS

## 2022-01-24 MED ORDER — NITROGLYCERIN 0.4 MG SL SUBL
0.4000 mg | SUBLINGUAL_TABLET | SUBLINGUAL | Status: DC | PRN
  Administered 2022-01-24: 0.4 mg via SUBLINGUAL
  Filled 2022-01-24: qty 1

## 2022-01-24 MED ORDER — ACETAMINOPHEN 325 MG PO TABS
650.0000 mg | ORAL_TABLET | Freq: Four times a day (QID) | ORAL | Status: DC | PRN
  Administered 2022-01-25: 650 mg via ORAL
  Filled 2022-01-24: qty 2

## 2022-01-24 MED ORDER — ACETAMINOPHEN 650 MG RE SUPP: 650.0000 mg | Freq: Four times a day (QID) | RECTAL | Status: DC | PRN

## 2022-01-24 NOTE — ED Notes (Signed)
Report given to Angela Nevin, RN via secure chat.  All questions answered

## 2022-01-24 NOTE — ED Provider Notes (Signed)
[facility] EMERGENCY DEPARTMENT Provider Note   CSN: [id number] Arrival date & time: 01/24/22  1348     History  Chief Complaint  Patient presents with   Chest Pain    Ronald Petersen is a 66 y.o. male.  66 year old male with past medical history of CAD s/p PCI most recently status post CABG x2 discharged 12/29 2022 presents today for evaluation of intermittent chest pain over the past week that became constant and radiated down left upper extremity today associated with nausea.  Patient denies diaphoresis, lightheadedness, palpitations, abdominal pain, or vomiting.  Patient does report his most recent cardiology visit he was told he was retaining fluid and started on Lasix.  He does endorse dyspnea on exertion today.  Denies peripheral edema.  He denies fever, productive cough.  He has not taken anything prior to arrival.  He reports his symptoms are worse when he lies flat.  The history is provided by the patient. No language interpreter was used.      Home Medications Prior to Admission medications   Medication Sig Start Date End Date Taking? Authorizing Provider  acetaminophen (TYLENOL) 650 MG CR tablet Take 1,300 mg by mouth every 8 (eight) hours as needed for pain.    [provider]  aspirin EC 325 MG EC tablet Take 1 tablet (325 mg total) by mouth daily. 12/28/21   John Giovanni, PA-C  Continuous Blood Gluc Receiver (FREESTYLE LIBRE 2 READER) DEVI Use as directed to check blood sugar 07/30/21   Shamleffer, Melanie Crazier, MD  Continuous Blood Gluc Sensor (FREESTYLE LIBRE 2 SENSOR) MISC Use to check blood sugar, change every 14 days 07/30/21   Shamleffer, Melanie Crazier, MD  dicyclomine (BENTYL) 10 MG capsule Take 1 capsule (10 mg total) by mouth every 8 (eight) hours as needed for spasms. 01/01/22   Armbruster, Carlota Raspberry, MD  ELDERBERRY PO Take 300 mg by mouth daily.    [provider]  empagliflozin (JARDIANCE) 10 MG TABS tablet Take 1 tablet (10 mg  total) by mouth daily. 12/28/21   Gold, Wayne E, PA-C  fenofibrate 160 MG tablet TAKE 1 TABLET (160 MG TOTAL) BY MOUTH DAILY. 05/03/21 05/03/22  [name], DO  ferrous [id number] C-folic acid (TRINSICON / FOLTRIN) capsule Take 1 capsule by mouth 2 (two) times daily after a meal. 12/28/21   Gold, Wayne E, PA-C  furosemide (LASIX) 40 MG tablet Take 1 tablet (40 mg total) by mouth daily. 01/16/22   Tobb, Kardie, DO  gabapentin (NEURONTIN) 800 MG tablet Take 1 tablet (800 mg total) by mouth 3 (three) times daily. 11/25/21   Hyatt, Max T, DPM  glucose blood (ONETOUCH VERIO) test strip USE AS INSTRUCTED TO CHECK BLOOD SUGAR ONCE A DAY 01/23/22 01/23/23  Shamleffer, Melanie Crazier, MD  lidocaine (LIDODERM) 5 % Place 1 patch onto the skin daily. Remove & Discard patch within 12 hours or as directed by MD 01/16/22   Tobb, Godfrey Pick, DO  Magnesium Citrate 200 MG TABS Take 400 mg by mouth daily.    [provider]  metoprolol succinate (TOPROL XL) 25 MG 24 hr tablet Take 1/2 tablet (12.5 mg total) by mouth daily. 01/16/22   Tobb, Kardie, DO  ondansetron (ZOFRAN) 4 MG tablet Take 1 tablet (4 mg total) by mouth every 8 (eight) hours as needed. 11/05/21 11/05/22  Roma Schanz R, DO  pantoprazole (PROTONIX) 40 MG tablet TAKE 1 TABLET (40 MG TOTAL) BY  MOUTH ONCE A DAY 12/28/21  Gold, Wayne E, PA-C  polyethylene glycol powder (GLYCOLAX/MIRALAX) 17 GM/SCOOP powder Take 17 g by mouth 2 (two) times daily as needed for moderate constipation. 12/28/21   Gold, Patrick Jupiter E, PA-C  potassium chloride SA (KLOR-CON M) 20 MEQ tablet Take 1 tablet by mouth daily for three days then resume taking 1 tablet every other day 01/06/22   Antony Odea, PA-C  rosuvastatin (CRESTOR) 40 MG tablet TAKE 1 TABLET BY MOUTH ONCE DAILY 12/11/20 01/23/22  Tobb, Kardie, DO  sertraline (ZOLOFT) 100 MG tablet TAKE ONE AND ONE-HALF TABLET BY MOUTH ONCE DAILY 03/07/21 04/23/22  [name], Yvonne R, DO  tiZANidine (ZANAFLEX) 4  MG tablet TAKE 1 TABLET (4 MG TOTAL) BY MOUTH EVERY 6 (SIX) HOURS AS NEEDED FOR MUSCLE SPASMS. 01/01/22   [name], Alferd Apa, DO  traZODone (DESYREL) 50 MG tablet Take 1 tablet (50 mg total) by mouth at bedtime. 12/28/21 12/28/22  John Giovanni, PA-C  traZODone (DESYREL) 50 MG tablet Take 1 tablet (50 mg total) by mouth at bedtime as needed for sleep. 01/20/22   [name], DO  vitamin E 180 MG (400 UNITS) capsule Take 1,600 Units by mouth daily.    [provider]      Allergies    Niacin    Review of Systems   Review of Systems  Constitutional:  Negative for chills and fever.  Respiratory:  Positive for shortness of breath.   Cardiovascular:  Positive for chest pain. Negative for palpitations and leg swelling.  Gastrointestinal:  Positive for nausea. Negative for abdominal pain and vomiting.  Genitourinary:  Negative for difficulty urinating.  Neurological:  Negative for light-headedness.  All other systems reviewed and are negative.  Physical Exam Updated Vital Signs BP (!) 161/88    Pulse 66    Temp 97.6 F (36.4 C) (Oral)    Resp 17    Ht 5\' 11"  (1.803 m)    Wt 83.5 kg    SpO2 100%    BMI 25.66 kg/m  Physical Exam Vitals and nursing note reviewed.  Constitutional:      General: He is not in acute distress.    Appearance: Normal appearance. He is not ill-appearing.  HENT:     Head: Normocephalic and atraumatic.     Nose: Nose normal.  Eyes:     General: No scleral icterus.    Extraocular Movements: Extraocular movements intact.     Conjunctiva/sclera: Conjunctivae normal.  Cardiovascular:     Rate and Rhythm: Normal rate and regular rhythm.     Pulses: Normal pulses.     Comments: Unable to appreciate JVP on exam Pulmonary:     Effort: Pulmonary effort is normal. No respiratory distress.     Breath sounds: Normal breath sounds. No wheezing or rales.  Abdominal:     General: There is no distension.     Tenderness: There is no abdominal tenderness.   Musculoskeletal:        General: Normal range of motion.     Cervical back: Normal range of motion.     Right lower leg: No edema.     Left lower leg: No edema.  Skin:    General: Skin is warm and dry.  Neurological:     General: No focal deficit present.     Mental Status: He is alert. Mental status is at baseline.    ED Results / Procedures / Treatments   Labs (all labs ordered are listed, but only abnormal  results are displayed) Labs Reviewed  CBC  BASIC METABOLIC PANEL  MAGNESIUM  BRAIN NATRIURETIC PEPTIDE  TROPONIN I (HIGH SENSITIVITY)    EKG EKG Interpretation  Date/Time:  Friday January 24 2022 13:55:37 EST Ventricular Rate:  66 PR Interval:  172 QRS Duration: 98 QT Interval:  404 QTC Calculation: 423 R Axis:   10 Text Interpretation: Normal sinus rhythm Normal ECG When compared with ECG of 25-Dec-2021 06:53, PREVIOUS ECG IS PRESENT Similar to Dec 28th tracing Confirmed by Nanda Quinton [phone]) on 01/24/2022 2:05:40 PM  Radiology No results found.  Procedures Procedures    Medications Ordered in ED Medications  nitroGLYCERIN (NITROSTAT) SL tablet 0.4 mg (has no administration in time range)    ED Course/ Medical Decision Making/ A&P                           Medical Decision Making Amount and/or Complexity of Data Reviewed Labs: ordered. Radiology: ordered.  Risk Prescription drug management. Decision regarding hospitalization.   Medical Decision Making / ED Course   This patient presents to the ED for concern of chest pain, this involves an extensive number of treatment options, and is a complaint that carries with it a high risk of complications and morbidity.  The differential diagnosis includes ACS, pneumonia, PE, MSK pain, GERD  MDM: 66 year old male with significant cardiac risk including CAD s/p PCI and most recently status post CABG x2 last month where he was discharged 12/29.  Recently diagnosed with volume retention and started on  Lasix.  Patient presents today for evaluation of chest pain that radiated down to his left upper extremity associated with nausea and dyspnea on exertion.  Patient after nitro x1 improvement in pain from 9/10-4/10. CBC without leukocytosis, anemia, BMP without electrolyte derangements.  Initial troponin of 4, BNP of 84.  EKG without acute ischemic changes.  Chest x-ray does show new and progressing left-sided pleural effusion.  Case discussed with cardiology will state if patient remains chest pain-free, with negative biomarkers, without evolving EKG changes patient will be okay for discharge with Imdur otherwise will likely require admission.  Patient remained with chest pain worse with exertion.  Patient does have dyspnea on exertion when ambulating within the room.  Repeat EKG without evolving changes.  CT scan done to evaluate for pleural effusion.  Pleural effusions are loculated without associated pneumonia present.  Discussed with cardiology who recommends admission to medical service.  Discussed with hospitalist will evaluate patient for admission.   Additional history obtained: -Additional history obtained from wife who is at bedside.  Recent admission discharged 12/29 confirming patient had CABG x2. -External records from outside source obtained and reviewed including: Chart review including previous notes, labs, imaging, consultation notes   Lab Tests: -I ordered, reviewed, and interpreted labs.   The pertinent results include:   Labs Reviewed  CBC  BASIC METABOLIC PANEL  MAGNESIUM  BRAIN NATRIURETIC PEPTIDE  TROPONIN I (HIGH SENSITIVITY)      EKG  EKG Interpretation  Date/Time:  Friday January 24 2022 13:55:37 EST Ventricular Rate:  66 PR Interval:  172 QRS Duration: 98 QT Interval:  404 QTC Calculation: 423 R Axis:   10 Text Interpretation: Normal sinus rhythm Normal ECG When compared with ECG of 25-Dec-2021 06:53, PREVIOUS ECG IS PRESENT Similar to Dec 28th tracing  Confirmed by Nanda Quinton [phone]) on 01/24/2022 2:05:40 PM         Imaging Studies ordered: I ordered imaging studies  including CT chest with contrast I independently visualized and interpreted imaging. I agree with the radiologist interpretation   Medicines ordered and prescription drug management: Meds ordered this encounter  Medications   nitroGLYCERIN (NITROSTAT) SL tablet 0.4 mg    -I have reviewed the patients home medicines and have made adjustments as needed  Consultations Obtained: I requested consultation with the cardiology,  and discussed lab and imaging findings as well as pertinent plan - they recommend: Admission to medical service and they will consult   Cardiac Monitoring: The patient was maintained on a cardiac monitor.  I personally viewed and interpreted the cardiac monitored which showed an underlying rhythm of: Normal sinus rhythm  Reevaluation: After the interventions noted above, I reevaluated the patient and found that they have :improved  Co morbidities that complicate the patient evaluation  Past Medical History:  Diagnosis Date   Anxiety    Arthritis    Cancer ([place])    Complication of anesthesia    aspirated with back surgery at age 57   Depression    Diabetes mellitus Type II    GERD (gastroesophageal reflux disease)    Hyperlipidemia    Hypertension    Neuromuscular disorder ([place])    NEUROPATHY   Right rotator cuff tear 11/23/2018   Sleep apnea    no CPAP      Dispostion: Patient admitted to [facility] for further evaluation and management.   Final Clinical Impression(s) / ED Diagnoses Final diagnoses:  Unstable angina ([place])  Pleural effusion    Rx / DC Orders ED Discharge Orders     None         Evlyn Courier, PA-C 01/24/22 1959    Margette Fast, MD 01/30/22 [phone]

## 2022-01-24 NOTE — ED Notes (Signed)
Report given to [facility].  All questions answered

## 2022-01-24 NOTE — ED Notes (Signed)
Patient transported to [facility] via [facility].  All personal belongings with patient.

## 2022-01-24 NOTE — ED Notes (Signed)
Patient c/o HA.  Medicated per order.  Updated on plan of care.

## 2022-01-24 NOTE — ED Triage Notes (Signed)
Pt c/o CP x 2 hours-states pain is worse with movement-NAD-steady gait

## 2022-01-25 ENCOUNTER — Inpatient Hospital Stay (HOSPITAL_COMMUNITY): Payer: Medicare HMO

## 2022-01-25 ENCOUNTER — Encounter (HOSPITAL_COMMUNITY): Payer: Self-pay | Admitting: Family Medicine

## 2022-01-25 DIAGNOSIS — J90 Pleural effusion, not elsewhere classified: Secondary | ICD-10-CM | POA: Diagnosis not present

## 2022-01-25 DIAGNOSIS — R0789 Other chest pain: Secondary | ICD-10-CM | POA: Diagnosis present

## 2022-01-25 LAB — AMYLASE, PLEURAL OR PERITONEAL FLUID: Amylase, Fluid: 43 U/L

## 2022-01-25 LAB — HIV ANTIBODY (ROUTINE TESTING W REFLEX): HIV Screen 4th Generation wRfx: NONREACTIVE

## 2022-01-25 LAB — LACTATE DEHYDROGENASE, PLEURAL OR PERITONEAL FLUID: LD, Fluid: 126 U/L — ABNORMAL HIGH (ref 3–23)

## 2022-01-25 LAB — PROTEIN, PLEURAL OR PERITONEAL FLUID: Total protein, fluid: 5.1 g/dL

## 2022-01-25 LAB — CBC WITH DIFFERENTIAL/PLATELET
Abs Immature Granulocytes: 0.01 10*3/uL (ref 0.00–0.07)
Basophils Absolute: 0.1 10*3/uL (ref 0.0–0.1)
Basophils Relative: 1 %
Eosinophils Absolute: 0.3 10*3/uL (ref 0.0–0.5)
Eosinophils Relative: 5 %
HCT: 42.2 % (ref 39.0–52.0)
Hemoglobin: 13.3 g/dL (ref 13.0–17.0)
Immature Granulocytes: 0 %
Lymphocytes Relative: 30 %
Lymphs Abs: 2.1 10*3/uL (ref 0.7–4.0)
MCH: 27 pg (ref 26.0–34.0)
MCHC: 31.5 g/dL (ref 30.0–36.0)
MCV: 85.8 fL (ref 80.0–100.0)
Monocytes Absolute: 0.7 10*3/uL (ref 0.1–1.0)
Monocytes Relative: 9 %
Neutro Abs: 3.9 10*3/uL (ref 1.7–7.7)
Neutrophils Relative %: 55 %
Platelets: 243 10*3/uL (ref 150–400)
RBC: 4.92 MIL/uL (ref 4.22–5.81)
RDW: 13.5 % (ref 11.5–15.5)
WBC: 7 10*3/uL (ref 4.0–10.5)
nRBC: 0 % (ref 0.0–0.2)

## 2022-01-25 LAB — COMPREHENSIVE METABOLIC PANEL
ALT: 13 U/L (ref 0–44)
AST: 19 U/L (ref 15–41)
Albumin: 3.7 g/dL (ref 3.5–5.0)
Alkaline Phosphatase: 69 U/L (ref 38–126)
Anion gap: 10 (ref 5–15)
BUN: 21 mg/dL (ref 8–23)
CO2: 25 mmol/L (ref 22–32)
Calcium: 9.5 mg/dL (ref 8.9–10.3)
Chloride: 104 mmol/L (ref 98–111)
Creatinine, Ser: 1.09 mg/dL (ref 0.61–1.24)
GFR, Estimated: >60 mL/min (ref >60)
Glucose, Bld: 92 mg/dL (ref 70–99)
Potassium: 4 mmol/L (ref 3.5–5.1)
Sodium: 139 mmol/L (ref 135–145)
Total Bilirubin: 0.5 mg/dL (ref 0.3–1.2)
Total Protein: 7.2 g/dL (ref 6.5–8.1)

## 2022-01-25 LAB — BODY FLUID CELL COUNT WITH DIFFERENTIAL
Eos, Fluid: 11 %
Lymphs, Fluid: 71 %
Monocyte-Macrophage-Serous Fluid: 16 % — ABNORMAL LOW (ref 50–90)
Neutrophil Count, Fluid: 2 % (ref 0–25)
Total Nucleated Cell Count, Fluid: 735 cu mm (ref 0–1000)

## 2022-01-25 LAB — PHOSPHORUS: Phosphorus: 4 mg/dL (ref 2.5–4.6)

## 2022-01-25 LAB — ALBUMIN, PLEURAL OR PERITONEAL FLUID: Albumin, Fluid: 3.1 g/dL

## 2022-01-25 LAB — LACTATE DEHYDROGENASE: LDH: 120 U/L (ref 98–192)

## 2022-01-25 LAB — PROCALCITONIN: Procalcitonin: <0.1 ng/mL

## 2022-01-25 LAB — GLUCOSE, CAPILLARY
Glucose-Capillary: 86 mg/dL (ref 70–99)
Glucose-Capillary: 197 mg/dL — ABNORMAL HIGH (ref 70–99)

## 2022-01-25 LAB — TROPONIN I (HIGH SENSITIVITY): Troponin I (High Sensitivity): 8 ng/L (ref <18)

## 2022-01-25 LAB — PROTIME-INR
INR: 1.1 (ref 0.8–1.2)
Prothrombin Time: 14.3 seconds (ref 11.4–15.2)

## 2022-01-25 LAB — MAGNESIUM: Magnesium: 2.1 mg/dL (ref 1.7–2.4)

## 2022-01-25 LAB — GLUCOSE, PLEURAL OR PERITONEAL FLUID: Glucose, Fluid: 102 mg/dL

## 2022-01-25 LAB — MRSA NEXT GEN BY PCR, NASAL: MRSA by PCR Next Gen: NOT DETECTED

## 2022-01-25 LAB — LIPASE, BLOOD: Lipase: 31 U/L (ref 11–51)

## 2022-01-25 MED ORDER — LIDOCAINE HCL (PF) 1 % IJ SOLN
INTRAMUSCULAR | Status: AC
  Filled 2022-01-25: qty 30

## 2022-01-25 MED ORDER — METOPROLOL SUCCINATE ER 25 MG PO TB24
12.5000 mg | ORAL_TABLET | Freq: Every day | ORAL | Status: DC
  Administered 2022-01-25: 12.5 mg via ORAL
  Filled 2022-01-25: qty 1

## 2022-01-25 MED ORDER — PANTOPRAZOLE SODIUM 40 MG PO TBEC
40.0000 mg | DELAYED_RELEASE_TABLET | Freq: Every day | ORAL | Status: DC
  Administered 2022-01-25: 40 mg via ORAL
  Filled 2022-01-25: qty 1

## 2022-01-25 MED ORDER — TRAZODONE HCL 50 MG PO TABS
50.0000 mg | ORAL_TABLET | Freq: Every evening | ORAL | Status: DC | PRN
  Administered 2022-01-25: 50 mg via ORAL
  Filled 2022-01-25: qty 1

## 2022-01-25 MED ORDER — ASPIRIN EC 325 MG PO TBEC
325.0000 mg | DELAYED_RELEASE_TABLET | Freq: Every day | ORAL | Status: DC
  Administered 2022-01-25: 325 mg via ORAL
  Filled 2022-01-25: qty 1

## 2022-01-25 MED ORDER — GABAPENTIN 400 MG PO CAPS
800.0000 mg | ORAL_CAPSULE | Freq: Three times a day (TID) | ORAL | Status: DC
  Administered 2022-01-25 (×2): 800 mg via ORAL
  Filled 2022-01-25 (×2): qty 2

## 2022-01-25 MED ORDER — ROSUVASTATIN CALCIUM 20 MG PO TABS
40.0000 mg | ORAL_TABLET | Freq: Every day | ORAL | Status: DC
  Administered 2022-01-25: 40 mg via ORAL
  Filled 2022-01-25: qty 2

## 2022-01-25 MED ORDER — FUROSEMIDE 40 MG PO TABS
40.0000 mg | ORAL_TABLET | Freq: Every day | ORAL | Status: DC
  Administered 2022-01-25: 40 mg via ORAL
  Filled 2022-01-25: qty 1

## 2022-01-25 MED ORDER — INSULIN ASPART 100 UNIT/ML IJ SOLN
0.0000 [IU] | Freq: Three times a day (TID) | INTRAMUSCULAR | Status: DC
  Administered 2022-01-25: 1 [IU] via SUBCUTANEOUS

## 2022-01-25 MED ORDER — FE FUMARATE-B12-VIT C-FA-IFC PO CAPS
1.0000 | ORAL_CAPSULE | Freq: Two times a day (BID) | ORAL | Status: DC
  Administered 2022-01-25: 1 via ORAL
  Filled 2022-01-25 (×2): qty 1

## 2022-01-25 MED ORDER — SERTRALINE HCL 100 MG PO TABS
100.0000 mg | ORAL_TABLET | Freq: Every day | ORAL | Status: DC
  Administered 2022-01-25 (×2): 100 mg via ORAL
  Filled 2022-01-25 (×2): qty 1

## 2022-01-25 MED ORDER — TIZANIDINE HCL 4 MG PO TABS
4.0000 mg | ORAL_TABLET | Freq: Four times a day (QID) | ORAL | Status: DC | PRN
  Administered 2022-01-25: 4 mg via ORAL
  Filled 2022-01-25: qty 1

## 2022-01-25 MED ORDER — OXYCODONE HCL 5 MG PO TABS
5.0000 mg | ORAL_TABLET | ORAL | Status: DC | PRN
Start: 2022-01-25 — End: 2022-01-25
  Administered 2022-01-25: 5 mg via ORAL
  Filled 2022-01-25: qty 1

## 2022-01-25 NOTE — Progress Notes (Signed)
Mobility Specialist Progress Note:   01/25/22 1405  Mobility  Activity Ambulated with assistance in hallway  Level of Assistance Independent after set-up  Assistive Device None  Distance Ambulated (ft) 450 ft  Activity Response Tolerated well  $Mobility charge 1 Mobility   Pt received in bed willing to participate in mobility. No complaints of pain and asymptomatic. Pt left in bed with call bell in reach and all needs met.   [facility] Public librarian Phone [phone] Secondary Phone [phone]

## 2022-01-25 NOTE — Discharge Summary (Signed)
Physician Discharge Summary  Ronald Petersen ZOX:[id number] DOB: Sep 22, 1956 DOA: 01/24/2022  PCP: [name], DO  Admit date: 01/24/2022 Discharge date: 01/25/2022  Admitted From: Home Disposition: Home  Recommendations for Outpatient Follow-up:  Follow up with PCP in 1-2 weeks Schedule follow-up with CT surgery.  Home Health: N/A Equipment/Devices: N/A  Discharge Condition: Stable CODE STATUS: DNR Diet recommendation: Low-salt and low-carb diet  Discharge summary: 66 year old gentleman with history of CABG in December 2022 and progressive shortness of breath since then admitted with vague left-sided chest pain and dullness.  Recently started on Lasix.  Patient has large left-sided pleural effusion which is anticipated postoperative complication.  Admitted due to symptoms.   Symptomatic large left pleural effusion, left lower lobe atelectasis and collapse.  Status post CABG.   -On room air.  Ambulatory dyspnea.  No evidence of systemic infection.  Procalcitonin normal.  WBC count normal. -IR guided thoracentesis, 1.5 L dark red fluid removed, neutrophil count 750, LDH 126.  Exudative effusion by lights criteria, however no evidence of infection or loculated empyema. -He chest pain is mostly musculoskeletal, also has cervical spine issues that he is on muscle relaxants. -Recent echocardiogram during CABG with normal ejection fraction.  Coronary artery disease: Status post CABG.  On aspirin, Toprol-XL and high-dose statin.  No evidence of acute coronary syndrome.   Essential hypertension: Blood pressure stable.   Type 2 diabetes: Well-controlled.  On oral hypoglycemics at home.  Resume on discharge.  Patient was examined after undergoing thoracentesis.  He is walking in the hallway.  He is on room air.  He has mild persistent pain left anterior chest wall which is probably due to postop discomfort. No evidence of acute coronary syndrome. Repeat x-ray after 1.5 L  thoracentesis with moderate persistent effusion and likely atelectasis and collapse of the left lower lung. Patient is on room air and nontoxic-appearing.  No evidence of infection.  Plan: Since he is stable and on room air, will discharge him home.  He will continue to use incentive spirometry and mobility at home. I will notify his cardiothoracic surgeon, he may benefit with repeat chest x-ray and if persistent fluid, another thoracentesis in 1 to 2 weeks.  This patient was admitted to the hospital as inpatient in anticipation of requiring inpatient procedures and possible more surgeries, however we were able to consult interventional radiology and they were able to do procedure same day resulting in clinical recovery and improvement.  Rest of the management can be done outpatient hence he is able to go home before 2 midnights.       Discharge Diagnoses:  Principal Problem:   Pleural effusion on left Active Problems:   Diabetes mellitus, type II (HCC)   Hyperlipidemia LDL goal <70   Depression   Gastroesophageal reflux disease   Coronary artery disease   Atypical chest pain    Discharge Instructions  Discharge Instructions     Call MD for:  difficulty breathing, headache or visual disturbances   Complete by: As directed    Call MD for:  severe uncontrolled pain   Complete by: As directed    Diet - low sodium heart healthy   Complete by: As directed    Diet Carb Modified   Complete by: As directed    Discharge instructions   Complete by: As directed    Continue doing breathing exercises at home.   Increase activity slowly   Complete by: As directed       Allergies  as of 01/25/2022       Reactions   Niacin Anaphylaxis        Medication List     STOP taking these medications    lidocaine 5 % Commonly known as: Lidoderm       TAKE these medications    acetaminophen 650 MG CR tablet Commonly known as: TYLENOL Take 650 mg by mouth every 8 (eight) hours  as needed for pain.   aspirin 325 MG EC tablet Take 1 tablet (325 mg total) by mouth daily.   dicyclomine 10 MG capsule Commonly known as: BENTYL Take 1 capsule (10 mg total) by mouth every 8 (eight) hours as needed for spasms.   ELDERBERRY PO Take 300 mg by mouth daily.   fenofibrate 160 MG tablet TAKE 1 TABLET (160 MG TOTAL) BY MOUTH DAILY. What changed: how much to take   Ferocon capsule Generic drug: ferrous [id number] C-folic acid Take 1 capsule by mouth 2 (two) times daily after a meal.   FreeStyle Libre 2 Reader [employer] Use as directed to check blood sugar   FreeStyle Libre 2 Sensor Misc Use to check blood sugar, change every 14 days   furosemide 40 MG tablet Commonly known as: LASIX Take 1 tablet (40 mg total) by mouth daily.   gabapentin 800 MG tablet Commonly known as: Neurontin Take 1 tablet (800 mg total) by mouth 3 (three) times daily.   Jardiance 10 MG Tabs tablet Generic drug: empagliflozin Take 1 tablet (10 mg total) by mouth daily.   Magnesium Citrate 200 MG Tabs Take 400 mg by mouth daily.   metoprolol succinate 25 MG 24 hr tablet Commonly known as: Toprol XL Take 1/2 tablet (12.5 mg total) by mouth daily.   ondansetron 4 MG tablet Commonly known as: ZOFRAN Take 1 tablet (4 mg total) by mouth every 8 (eight) hours as needed.   OneTouch Verio test strip Generic drug: glucose blood USE AS INSTRUCTED TO CHECK BLOOD SUGAR ONCE A DAY   pantoprazole 40 MG tablet Commonly known as: PROTONIX TAKE 1 TABLET (40 MG TOTAL) BY  MOUTH ONCE A DAY   polyethylene glycol powder 17 GM/SCOOP powder Commonly known as: GLYCOLAX/MIRALAX Take 17 g by mouth 2 (two) times daily as needed for moderate constipation.   potassium chloride SA 20 MEQ tablet Commonly known as: KLOR-CON M Take 1 tablet by mouth daily for three days then resume taking 1 tablet every other day   rosuvastatin 40 MG tablet Commonly known as: CRESTOR TAKE 1 TABLET BY MOUTH ONCE  DAILY What changed: how much to take   sertraline 100 MG tablet Commonly known as: ZOLOFT TAKE ONE AND ONE-HALF TABLET BY MOUTH ONCE DAILY What changed:  how much to take how to take this when to take this   tiZANidine 4 MG tablet Commonly known as: ZANAFLEX TAKE 1 TABLET (4 MG TOTAL) BY MOUTH EVERY 6 (SIX) HOURS AS NEEDED FOR MUSCLE SPASMS.   traZODone 50 MG tablet Commonly known as: DESYREL Take 1 tablet (50 mg total) by mouth at bedtime as needed for sleep.   vitamin E 180 MG (400 UNITS) capsule Take 1,600 Units by mouth daily.        Follow-up Information     Roma Schanz R, DO Follow up in 1 week(s).   Specialty: Family Medicine Contact information: [address] [place] [address] [phone]         Berniece Salines, DO .   Specialty: Cardiology  Contact information: [address] [place] [address] [phone]         Lajuana Matte, MD. Schedule an appointment as soon as possible for a visit.   Specialty: Cardiothoracic Surgery Contact information: [address] [place] [address] [phone]                Allergies  Allergen Reactions   Niacin Anaphylaxis    Consultations: None   Procedures/Studies: DG Chest 1 View  Result Date: 01/25/2022 CLINICAL DATA:  Pleural effusion, status post thoracentesis, left side EXAM: CHEST  1 VIEW COMPARISON:  Previous day FINDINGS: Cardiac silhouette is obscured due to moderate left pleural effusion which has decreased in size. No right pleural effusion. No pneumothorax. Left lung bases opacified, otherwise no new consolidative opacity. Median sternotomy wires. IMPRESSION: Residual moderate pleural effusion status post left thoracentesis is slightly improved. No pneumothorax. Electronically Signed   By: Albin Felling M.D.   On: 01/25/2022 10:18   DG Chest 2 View  Result Date: 01/24/2022 CLINICAL DATA:  Chest pain. Cardiac surgery  approximately 1 month ago. EXAM: CHEST - 2 VIEW COMPARISON:  Radiographs 12/26/2021 and 12/25/2021. FINDINGS: The right IJ central venous catheter has been removed in the interval. The heart size and mediastinal contours are stable status post median sternotomy and CABG. There is an enlarging sub pulmonic pleural effusion on the left, now moderate in volume. There is associated compressive atelectasis at the left lung base. The right lung is clear. There is no pneumothorax. The bones appear unchanged. IMPRESSION: Enlarging moderate-sized subpulmonic left pleural effusion with associated left basilar atelectasis. No pneumothorax. Electronically Signed   By: Richardean Sale M.D.   On: 01/24/2022 14:52   CT Chest W Contrast  Result Date: 01/24/2022 CLINICAL DATA:  Chronic dyspnea. EXAM: CT CHEST WITH CONTRAST TECHNIQUE: Multidetector CT imaging of the chest was performed during intravenous contrast administration. RADIATION DOSE REDUCTION: This exam was performed according to the departmental dose-optimization program which includes automated exposure control, adjustment of the mA and/or kV according to patient size and/or use of iterative reconstruction technique. CONTRAST:  39mL OMNIPAQUE IOHEXOL 300 MG/ML  SOLN COMPARISON:  November 13, 2020. FINDINGS: Cardiovascular: Atherosclerosis of thoracic aorta is noted without aneurysm or dissection. Status post coronary artery bypass graft. Normal cardiac size. Small pericardial effusion is noted. Mediastinum/Nodes: No enlarged mediastinal, hilar, or axillary lymph nodes. Thyroid gland, trachea, and esophagus demonstrate no significant findings. Lungs/Pleura: No pneumothorax is noted. Right lung is clear. Large left pleural effusion is noted with atelectasis of the left lower lobe and subsegmental atelectasis of the left upper lobe. Upper Abdomen: No acute abnormality. Musculoskeletal: No chest wall abnormality. No acute or significant osseous findings. IMPRESSION:  Large left pleural effusion is noted with atelectasis of the left lower lobe and subsegmental atelectasis of the left upper lobe. Small pericardial effusion. Aortic Atherosclerosis (ICD10-I70.0). Electronically Signed   By: Marijo Conception M.D.   On: 01/24/2022 18:23   US THORACENTESIS ASP PLEURAL SPACE W/IMG GUIDE  Result Date: 01/25/2022 INDICATION: Left pleural effusion. Post CABG 5 weeks ago EXAM: ULTRASOUND GUIDED Left THORACENTESIS MEDICATIONS: 10 cc 1% lidocaine. COMPLICATIONS: None immediate. PROCEDURE: An ultrasound guided thoracentesis was thoroughly discussed with the patient and questions answered. The benefits, risks, alternatives and complications were also discussed. The patient understands and wishes to proceed with the procedure. Written consent was obtained. Ultrasound was performed to localize and mark an adequate pocket of fluid in the LEFT  chest. The area was then prepped and draped in the normal sterile fashion. 1% Lidocaine was used for local anesthesia. Under ultrasound guidance a Yueh catheter was introduced. Thoracentesis was performed. The catheter was removed and a dressing applied. FINDINGS: A total of approximately 1.5 liters of dark red fluid was removed. Samples were sent to the laboratory as requested by the clinical team. IMPRESSION: Successful ultrasound guided LEFT thoracentesis yielding 1.5 of pleural fluid. Read by Lavonia Drafts [facility] Electronically Signed   By: Michaelle Birks M.D.   On: 01/25/2022 11:55   (Echo, Carotid, EGD, Colonoscopy, ERCP)    Subjective: Patient seen and examined in the morning rounds.  He had all left-sided discomfort. I examined him in the afternoon for discharge readiness.  His son was at the bedside.  Patient was walking around in the hallway.  Still has some left anterior chest wall discomfort but denies any shortness of breath on mobility around the hallway.   Discharge Exam: Vitals:   01/25/22 0959 01/25/22 1223  BP: 110/65 112/85   Pulse:  70  Resp:  17  Temp:  98.5 F (36.9 C)  SpO2:  94%   Vitals:   01/25/22 0700 01/25/22 0923 01/25/22 0959 01/25/22 1223  BP: 114/71 121/74 110/65 112/85  Pulse: 68   70  Resp: 14   17  Temp: 97.9 F (36.6 C)   98.5 F (36.9 C)  TempSrc: Oral   Oral  SpO2: 95%   94%  Weight:      Height:        General: Pt is alert, awake, not in acute distress Cardiovascular: RRR, S1/S2 +, no rubs, no gallops Respiratory: No added sounds.  Left side with decreased air entry mostly at the bases and midlung [place]. Abdominal: Soft, NT, ND, bowel sounds + Extremities: no edema, no cyanosis    The results of significant diagnostics from this hospitalization (including imaging, microbiology, ancillary and laboratory) are listed below for reference.     Microbiology: Recent Results (from the past 240 hour(s))  Resp Panel by RT-PCR (Flu A&B, Covid) Nasopharyngeal Swab     Status: None   Collection Time: 01/24/22  7:48 PM   Specimen: Nasopharyngeal Swab; Nasopharyngeal(NP) swabs in vial transport medium  Result Value Ref Range Status   SARS Coronavirus 2 by RT PCR NEGATIVE NEGATIVE Final    Comment: (NOTE) SARS-CoV-2 target nucleic acids are NOT DETECTED.  The SARS-CoV-2 RNA is generally detectable in upper respiratory specimens during the acute phase of infection. The lowest concentration of SARS-CoV-2 viral copies this assay can detect is 138 copies/mL. A negative result does not preclude SARS-Cov-2 infection and should not be used as the sole basis for treatment or other patient management decisions. A negative result may occur with  improper specimen collection/handling, submission of specimen other than nasopharyngeal swab, presence of viral mutation(s) within the areas targeted by this assay, and inadequate number of viral copies(<138 copies/mL). A negative result must be combined with clinical observations, patient history, and epidemiological information. The expected  result is Negative.  Fact Sheet for Patients:  EntrepreneurPulse.com.au  Fact Sheet for Healthcare Providers:  IncredibleEmployment.be  This test is no t yet approved or cleared by the [place] FDA and  has been authorized for detection and/or diagnosis of SARS-CoV-2 by FDA under an Emergency Use Authorization (EUA). This EUA will remain  in effect (meaning this test can be used) for the duration of the COVID-19 declaration under Section 564(b)(1) of the Act, 21  U.S.C.section 360bbb-3(b)(1), unless the authorization is terminated  or revoked sooner.       Influenza A by PCR NEGATIVE NEGATIVE Final   Influenza B by PCR NEGATIVE NEGATIVE Final    Comment: (NOTE) The Xpert Xpress SARS-CoV-2/FLU/RSV plus assay is intended as an aid in the diagnosis of influenza from Nasopharyngeal swab specimens and should not be used as a sole basis for treatment. Nasal washings and aspirates are unacceptable for Xpert Xpress SARS-CoV-2/FLU/RSV testing.  Fact Sheet for Patients: EntrepreneurPulse.com.au  Fact Sheet for Healthcare Providers: IncredibleEmployment.be  This test is not yet approved or cleared by the [place] FDA and has been authorized for detection and/or diagnosis of SARS-CoV-2 by FDA under an Emergency Use Authorization (EUA). This EUA will remain in effect (meaning this test can be used) for the duration of the COVID-19 declaration under Section 564(b)(1) of the Act, 21 U.S.C. section 360bbb-3(b)(1), unless the authorization is terminated or revoked.  Performed at [facility], [address] [place], [place] [address]   MRSA Next Gen by PCR, Nasal     Status: None   Collection Time: 01/24/22 11:57 PM   Specimen: Nasal Mucosa; Nasal Swab  Result Value Ref Range Status   MRSA by PCR Next Gen NOT DETECTED NOT DETECTED Final    Comment: (NOTE) The GeneXpert MRSA Assay (FDA approved  for NASAL specimens only), is one component of a comprehensive MRSA colonization surveillance program. It is not intended to diagnose MRSA infection nor to guide or monitor treatment for MRSA infections. Test performance is not FDA approved in patients less than 65 years old. Performed at [facility], [address], [place], [place] [address]      Labs: BNP (last 3 results) Recent Labs    01/24/22 1419  BNP 97.3   Basic Metabolic Panel: Recent Labs  Lab 01/24/22 1419 01/25/22 0120  NA 136 139  K 4.2 4.0  CL 99 104  CO2 27 25  GLUCOSE 103* 92  BUN 29* 21  CREATININE 1.03 1.09  CALCIUM 9.6 9.5  MG 2.2 2.1  PHOS  --  4.0   Liver Function Tests: Recent Labs  Lab 01/25/22 0120  AST 19  ALT 13  ALKPHOS 69  BILITOT 0.5  PROT 7.2  ALBUMIN 3.7   Recent Labs  Lab 01/25/22 0120  LIPASE 31   No results for input(s): AMMONIA in the last 168 hours. CBC: Recent Labs  Lab 01/24/22 1419 01/25/22 0120  WBC 7.3 7.0  NEUTROABS  --  3.9  HGB 14.3 13.3  HCT 44.7 42.2  MCV 85.1 85.8  PLT 286 243   Cardiac Enzymes: No results for input(s): CKTOTAL, CKMB, CKMBINDEX, TROPONINI in the last 168 hours. BNP: Invalid input(s): POCBNP CBG: Recent Labs  Lab 01/25/22 0824 01/25/22 1230  GLUCAP 86 197*   D-Dimer No results for input(s): DDIMER in the last 72 hours. Hgb A1c No results for input(s): HGBA1C in the last 72 hours. Lipid Profile No results for input(s): CHOL, HDL, LDLCALC, TRIG, CHOLHDL, LDLDIRECT in the last 72 hours. Thyroid function studies No results for input(s): TSH, T4TOTAL, T3FREE, THYROIDAB in the last 72 hours.  Invalid input(s): FREET3 Anemia work up No results for input(s): VITAMINB12, FOLATE, FERRITIN, TIBC, IRON, RETICCTPCT in the last 72 hours. Urinalysis    Component Value Date/Time   COLORURINE YELLOW 12/23/2021 1120   APPEARANCEUR CLEAR 12/23/2021 1120   LABSPEC 1.015 12/23/2021 1120   PHURINE 7.5 12/23/2021 1120  GLUCOSEU  250 (A) 12/23/2021 1120   HGBUR NEGATIVE 12/23/2021 1120   HGBUR small 11/05/2010 0941   BILIRUBINUR NEGATIVE 12/23/2021 1120   BILIRUBINUR 1+ 06/07/2021 1403   KETONESUR NEGATIVE 12/23/2021 1120   PROTEINUR NEGATIVE 12/23/2021 1120   UROBILINOGEN 2.0 (A) 06/07/2021 1403   UROBILINOGEN negative 11/05/2010 0941   NITRITE NEGATIVE 12/23/2021 1120   LEUKOCYTESUR NEGATIVE 12/23/2021 1120   Sepsis Labs Invalid input(s): PROCALCITONIN,  WBC,  LACTICIDVEN Microbiology Recent Results (from the past 240 hour(s))  Resp Panel by RT-PCR (Flu A&B, Covid) Nasopharyngeal Swab     Status: None   Collection Time: 01/24/22  7:48 PM   Specimen: Nasopharyngeal Swab; Nasopharyngeal(NP) swabs in vial transport medium  Result Value Ref Range Status   SARS Coronavirus 2 by RT PCR NEGATIVE NEGATIVE Final    Comment: (NOTE) SARS-CoV-2 target nucleic acids are NOT DETECTED.  The SARS-CoV-2 RNA is generally detectable in upper respiratory specimens during the acute phase of infection. The lowest concentration of SARS-CoV-2 viral copies this assay can detect is 138 copies/mL. A negative result does not preclude SARS-Cov-2 infection and should not be used as the sole basis for treatment or other patient management decisions. A negative result may occur with  improper specimen collection/handling, submission of specimen other than nasopharyngeal swab, presence of viral mutation(s) within the areas targeted by this assay, and inadequate number of viral copies(<138 copies/mL). A negative result must be combined with clinical observations, patient history, and epidemiological information. The expected result is Negative.  Fact Sheet for Patients:  EntrepreneurPulse.com.au  Fact Sheet for Healthcare Providers:  IncredibleEmployment.be  This test is no t yet approved or cleared by the [place] FDA and  has been authorized for detection and/or diagnosis of SARS-CoV-2  by FDA under an Emergency Use Authorization (EUA). This EUA will remain  in effect (meaning this test can be used) for the duration of the COVID-19 declaration under Section 564(b)(1) of the Act, 21 U.S.C.section 360bbb-3(b)(1), unless the authorization is terminated  or revoked sooner.       Influenza A by PCR NEGATIVE NEGATIVE Final   Influenza B by PCR NEGATIVE NEGATIVE Final    Comment: (NOTE) The Xpert Xpress SARS-CoV-2/FLU/RSV plus assay is intended as an aid in the diagnosis of influenza from Nasopharyngeal swab specimens and should not be used as a sole basis for treatment. Nasal washings and aspirates are unacceptable for Xpert Xpress SARS-CoV-2/FLU/RSV testing.  Fact Sheet for Patients: EntrepreneurPulse.com.au  Fact Sheet for Healthcare Providers: IncredibleEmployment.be  This test is not yet approved or cleared by the [place] FDA and has been authorized for detection and/or diagnosis of SARS-CoV-2 by FDA under an Emergency Use Authorization (EUA). This EUA will remain in effect (meaning this test can be used) for the duration of the COVID-19 declaration under Section 564(b)(1) of the Act, 21 U.S.C. section 360bbb-3(b)(1), unless the authorization is terminated or revoked.  Performed at [facility], [address] [place], [place] [address]   MRSA Next Gen by PCR, Nasal     Status: None   Collection Time: 01/24/22 11:57 PM   Specimen: Nasal Mucosa; Nasal Swab  Result Value Ref Range Status   MRSA by PCR Next Gen NOT DETECTED NOT DETECTED Final    Comment: (NOTE) The GeneXpert MRSA Assay (FDA approved for NASAL specimens only), is one component of a comprehensive MRSA colonization surveillance program. It is not intended to diagnose MRSA infection nor to guide or monitor treatment for MRSA infections.  Test performance is not FDA approved in patients less than 46 years old. Performed at [facility], [place] [address] [place], [place] [address]      Time coordinating discharge:  35 minutes  SIGNED:   Barb Merino, MD  [facility] 01/25/2022, 2:11 PM

## 2022-01-25 NOTE — H&P (Signed)
History and Physical    PLEASE NOTE THAT DRAGON DICTATION SOFTWARE WAS USED IN THE CONSTRUCTION OF THIS NOTE.   Ronald Petersen BJY:[id number] DOB: 04-19-1956 DOA: 01/24/2022  PCP: [name], DO  Patient coming from: home   I have personally briefly reviewed patient's old medical records in [place]  Chief Complaint: chest pain  HPI: Ronald Petersen is a 66 y.o. 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   The patient was hospitalized at [facility] in December 2022 during which time he underwent two-vessel CABG for subsequently being discharged home on 12/26/2021.  During a subsequent outpatient cardiology follow-up, he was told that he was "retaining fluid" and was started on Lasix 40 mg p.o. daily at that time.  He presented to [place] emergency department this evening complaining of 1 week of progressive sharp left-sided pain with radiation at times into the left arm.  States that this pain has been intermittent over that timeframe, worsening with direct palpation over the anterior/anterolateral portion of the left chest wall.  He also notes exacerbation with deep inspiration or cough in the absence of any hemoptysis.  He also reports intermittent reproducibility/exacerbation of this left sided chest discomfort with twisting motions of the upper torso, but denies any exacerbation/amelioration with laying supine versus leaning forward.  Pain is nonexertional.  He notes associated shortness of breath, which is worse when laying flat. he has also noticed worsening of edema in the bilateral lower  extremities but denies PND.  Denies any associated new calf tenderness or new lower extremity erythema.  Denies any associated palpitations, diaphoresis dizziness, presyncope, or syncope.  He has noticed some intermittent nausea in the absence of any vomiting.  Not associate with any subjective fever, chills, rigors, or generalized myalgias.  Denies any associated rhinitis, rhinorrhea, postnasal drip, sore throat.  No rash, abdominal pain, diarrhea.  Of note, most recent echocardiogram occurred on 12/16/2021 and was notable for LVEF 66 5%, no focal wall motion abnormalities, indeterminate diastolic parameters, trivial mitral regurgitation, and trivial aortic regurgitation.  He reports good compliance with his home Lasix, but does acknowledge that he missed 1 dose of this diuretic over the course of the last week, following which he noted further worsening of the edema in his bilateral lower extremities.      [facility] ED Course:  Vital signs in the ED were notable for the following: Afebrile; heart rate 63-76; blood pressure 117/71- 137/74; respiratory rate 14-20, oxygen saturation 96 100% on room air.  Labs were notable for the following: BMP notable for the following: Sodium 136, potassium 4.2, bicarbonate 27, creatinine 1.03 relative to most recent prior value of 0.82 on 01/16/2022.  BNP 84, without any prior BNP data points available for point comparison; high-sensitivity troponin I initially noted to be 4, with repeat value trending up slightly to 5.  CBC notable for will but cell count 7300, hemoglobin 14.3, platelet count 283.  COVID-19/influenza PCR negative.  Imaging and additional notable ED work-up: EKG showed sinus rhythm with heart rate 69, normal intervals, and no evidence of T wave or ST changes, including no evidence of  ST elevation.  Chest x-ray, in comparison to most recent prior chest x-rays performed on 12/25/2021 and 12/26/2021, shows interval increase in left pleural  effusion, now moderate in volume, with associated compressive atelectasis involving the left lung base, in the absence of any evidence of infiltrate, interstitial edema, pulmonary edema, pneumothorax, and demonstrating a clear right lung.  CT chest with contrast showed large left pleural effusion with atelectasis involving the left lower lobe, as well as small pericardial effusion, in the absence of any evidence of interstitial edema, pulmonary edema, infiltrate, pneumothorax.  EDP discussed the patient's case and imaging with the on-call cardiologist, who recommended admission to the hospitalist service at [facility] for further evaluation and management of large left pleural effusion and atypical chest pain, with plan for cardiology to consult.  While in the [place] ED, the following were administered: Acetaminophen 650 mg p.o. x1, sublingual nitroglycerin 0.4 mg x 1.  Of note, the patient denies any significant change in his left-sided chest pain and response to the aforementioned single dose of sublingual nitroglycerin.   Subsequently, the patient was transferred to [facility] for admission to the cardiac telemetry unit for further evaluation/management of large left pleural effusion as well as atypical chest pain.     Review of Systems: As per HPI otherwise 10 point review of systems negative.   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   Cancer [facility])    Complication of anesthesia    aspirated with back surgery at age 86   Depression    Diabetes mellitus Type II    GERD (gastroesophageal reflux disease)    Hyperlipidemia    Hypertension    Neuromuscular disorder ([place])    NEUROPATHY   Right rotator cuff tear 11/23/2018   Sleep apnea    no CPAP    Past Surgical History:  Procedure Laterality Date   APPENDECTOMY     ARTHOSCOPIC ROTAOR CUFF REPAIR Right 11/23/2018   Procedure:  ARTHROSCOPIC ROTATOR CUFF REPAIR;  Surgeon: [name], MD;  Location: [place];  Service: Orthopedics;  Laterality: Right;   CHOLECYSTECTOMY     CORONARY ARTERY BYPASS GRAFT N/A 12/24/2021   x2 LIMA to LAD; SVG to Obtuse Marginal   CORONARY STENT INTERVENTION N/A 11/19/2020   Procedure: CORONARY STENT INTERVENTION;  Surgeon: Nelva Bush, MD;  Location: [place] CV LAB;  Service: Cardiovascular;  Laterality: N/A;   ENDOVEIN HARVEST OF GREATER SAPHENOUS VEIN Right 12/24/2021   Procedure: ENDOVEIN HARVEST OF GREATER SAPHENOUS VEIN;  Surgeon: Lajuana Matte, MD;  Location: [place];  Service: Open Heart Surgery;  Laterality: Right;   INTRAVASCULAR ULTRASOUND/IVUS N/A 11/19/2020   Procedure: Intravascular Ultrasound/IVUS;  Surgeon: Nelva Bush, MD;  Location: [place] CV LAB;  Service: Cardiovascular;  Laterality: N/A;   LEFT HEART CATH AND CORONARY ANGIOGRAPHY N/A 11/19/2020   Procedure: LEFT HEART CATH AND CORONARY ANGIOGRAPHY;  Surgeon: Nelva Bush, MD;  Location: [place] CV LAB;  Service: Cardiovascular;  Laterality: N/A;   LEFT HEART CATH AND CORONARY ANGIOGRAPHY N/A 12/24/2020   Procedure: LEFT HEART CATH AND CORONARY ANGIOGRAPHY;  Surgeon: Martinique, Peter M, MD;  Location: [place] CV LAB;  Service: Cardiovascular;  Laterality: N/A;   LEFT HEART CATH AND CORONARY ANGIOGRAPHY N/A 12/16/2021   Procedure: LEFT HEART CATH AND CORONARY ANGIOGRAPHY;  Surgeon: Leonie Man, MD;  Location: [place] CV LAB;  Service: Cardiovascular;  Laterality: N/A;   LUMBAR LAMINECTOMY     SHOULDER ARTHROSCOPY WITH ROTATOR CUFF REPAIR AND SUBACROMIAL DECOMPRESSION Right 11/23/2018   Procedure: SHOULDER ARTHROSCOPY WITH ROTATOR CUFF REPAIR AND SUBACROMIAL DECOMPRESSION;  Surgeon: [name], MD;  Location: [place];  Service: Orthopedics;  Laterality: Right;   TEE WITHOUT CARDIOVERSION N/A 12/24/2021   Procedure: TRANSESOPHAGEAL  ECHOCARDIOGRAM (TEE);  Surgeon: Lajuana Matte, MD;  Location: [place];  Service: Open Heart Surgery;  Laterality: N/A;    Social History:  reports that he quit smoking about 36 years ago. His smoking use included cigarettes. He has never used smokeless tobacco. He reports that he does not currently use alcohol. He reports that he does not use drugs.   Allergies  Allergen Reactions   Niacin Anaphylaxis    Family History  Problem Relation Age of Onset   COPD Mother    Stroke Father    Ovarian cancer Sister    Stomach cancer Sister    Heart disease Sister        MI   Heart disease Sister        MI   Stomach cancer Maternal Grandmother    Alcohol abuse Other    Depression Other    Arthritis Other    Hypertension Other    Coronary artery disease Other    Ovarian cancer Other        neice   Ovarian cancer Other        neice   Colon polyps Neg Hx    Colon cancer Neg Hx     Family history reviewed and not pertinent    Prior to Admission medications   Medication Sig Start Date End Date Taking? Authorizing Provider  acetaminophen (TYLENOL) 650 MG CR tablet Take 1,300 mg by mouth every 8 (eight) hours as needed for pain.    [provider]  aspirin EC 325 MG EC tablet Take 1 tablet (325 mg total) by mouth daily. 12/28/21   John Giovanni, PA-C  Continuous Blood Gluc Receiver (FREESTYLE LIBRE 2 READER) DEVI Use as directed to check blood sugar 07/30/21   Shamleffer, Melanie Crazier, MD  Continuous Blood Gluc Sensor (FREESTYLE LIBRE 2 SENSOR) MISC Use to check blood sugar, change every 14 days 07/30/21   Shamleffer, Melanie Crazier, MD  dicyclomine (BENTYL) 10 MG capsule Take 1 capsule (10 mg total) by mouth every 8 (eight) hours as needed for spasms. 01/01/22   Armbruster, Carlota Raspberry, MD  ELDERBERRY PO Take 300 mg by mouth daily.    [provider]  empagliflozin (JARDIANCE) 10 MG TABS tablet Take 1 tablet (10 mg total) by mouth daily. 12/28/21   Gold, Wayne E, PA-C   fenofibrate 160 MG tablet TAKE 1 TABLET (160 MG TOTAL) BY MOUTH DAILY. 05/03/21 05/03/22  [name], DO  ferrous [id number] C-folic acid (TRINSICON / FOLTRIN) capsule Take 1 capsule by mouth 2 (two) times daily after a meal. 12/28/21   Gold, Wayne E, PA-C  furosemide (LASIX) 40 MG tablet Take 1 tablet (40 mg total) by mouth daily. 01/16/22   Tobb, Kardie, DO  gabapentin (NEURONTIN) 800 MG tablet Take 1 tablet (800 mg total) by mouth 3 (three) times daily. 11/25/21   Hyatt, Max T, DPM  glucose blood (ONETOUCH VERIO) test strip USE AS INSTRUCTED TO CHECK BLOOD SUGAR ONCE A DAY 01/23/22 01/23/23  Shamleffer, Melanie Crazier, MD  lidocaine (LIDODERM) 5 % Place 1 patch  onto the skin daily. Remove & Discard patch within 12 hours or as directed by MD 01/16/22   Tobb, Godfrey Pick, DO  Magnesium Citrate 200 MG TABS Take 400 mg by mouth daily.    [provider]  metoprolol succinate (TOPROL XL) 25 MG 24 hr tablet Take 1/2 tablet (12.5 mg total) by mouth daily. 01/16/22   Tobb, Kardie, DO  ondansetron (ZOFRAN) 4 MG tablet Take 1 tablet (4 mg total) by mouth every 8 (eight) hours as needed. 11/05/21 11/05/22  [name], DO  pantoprazole (PROTONIX) 40 MG tablet TAKE 1 TABLET (40 MG TOTAL) BY  MOUTH ONCE A DAY 12/28/21   Gold, Wayne E, PA-C  polyethylene glycol powder (GLYCOLAX/MIRALAX) 17 GM/SCOOP powder Take 17 g by mouth 2 (two) times daily as needed for moderate constipation. 12/28/21   Gold, Patrick Jupiter E, PA-C  potassium chloride SA (KLOR-CON M) 20 MEQ tablet Take 1 tablet by mouth daily for three days then resume taking 1 tablet every other day 01/06/22   Antony Odea, PA-C  rosuvastatin (CRESTOR) 40 MG tablet TAKE 1 TABLET BY MOUTH ONCE DAILY 12/11/20 01/23/22  Tobb, Kardie, DO  sertraline (ZOLOFT) 100 MG tablet TAKE ONE AND ONE-HALF TABLET BY MOUTH ONCE DAILY 03/07/21 04/23/22  [name], Yvonne R, DO  tiZANidine (ZANAFLEX) 4 MG tablet TAKE 1 TABLET (4 MG TOTAL) BY MOUTH EVERY 6  (SIX) HOURS AS NEEDED FOR MUSCLE SPASMS. 01/01/22   [name], Alferd Apa, DO  traZODone (DESYREL) 50 MG tablet Take 1 tablet (50 mg total) by mouth at bedtime. 12/28/21 12/28/22  John Giovanni, PA-C  traZODone (DESYREL) 50 MG tablet Take 1 tablet (50 mg total) by mouth at bedtime as needed for sleep. 01/20/22   [name], DO  vitamin E 180 MG (400 UNITS) capsule Take 1,600 Units by mouth daily.    [provider]     Objective    Physical Exam: Vitals:   01/24/22 2100 01/24/22 2115 01/24/22 2200 01/24/22 2323  BP: 123/74 120/66 126/83 137/74  Pulse: 72 73 74 71  Resp: 11 18 13 11   Temp:   98.9 F (37.2 C) 97.8 F (36.6 C)  TempSrc:   Oral Oral  SpO2: 95% 96% 95% 96%  Weight:      Height:        General: appears to be stated age; alert, oriented Skin: warm, dry, no rash Head:  AT/[place] Mouth:  Oral mucosa membranes appear moist, normal dentition Neck: supple; trachea midline Heart:  RRR; did not appreciate any M/R/G Lungs: Diminished left basilar breath sound, but otherwise CTAB, did not appreciate any wheezes, rales, or rhonchi Abdomen: + BS; soft, ND, NT Vascular: 2+ pedal pulses b/l; 2+ radial pulses b/l Extremities: 1-2+ edema in bilateral lower extremities; no muscle wasting Neuro: strength and sensation intact in upper and lower extremities b/l  Labs on Admission: I have personally reviewed following labs and imaging studies  CBC: Recent Labs  Lab 01/24/22 1419  WBC 7.3  HGB 14.3  HCT 44.7  MCV 85.1  PLT 852   Basic Metabolic Panel: Recent Labs  Lab 01/24/22 1419  NA 136  K 4.2  CL 99  CO2 27  GLUCOSE 103*  BUN 29*  CREATININE 1.03  CALCIUM 9.6  MG 2.2   GFR: Estimated Creatinine Clearance: 75.1 mL/min (by C-G formula based on SCr of 1.03 mg/dL). Liver Function Tests: No results for input(s): AST, ALT, ALKPHOS, BILITOT, PROT, ALBUMIN in the last  168 hours. No results for input(s): LIPASE, AMYLASE in the last 168 hours. No  results for input(s): AMMONIA in the last 168 hours. Coagulation Profile: No results for input(s): INR, PROTIME in the last 168 hours. Cardiac Enzymes: No results for input(s): CKTOTAL, CKMB, CKMBINDEX, TROPONINI in the last 168 hours. BNP (last 3 results) No results for input(s): PROBNP in the last 8760 hours. HbA1C: No results for input(s): HGBA1C in the last 72 hours. CBG: No results for input(s): GLUCAP in the last 168 hours. Lipid Profile: No results for input(s): CHOL, HDL, LDLCALC, TRIG, CHOLHDL, LDLDIRECT in the last 72 hours. Thyroid Function Tests: No results for input(s): TSH, T4TOTAL, FREET4, T3FREE, THYROIDAB in the last 72 hours. Anemia Panel: No results for input(s): VITAMINB12, FOLATE, FERRITIN, TIBC, IRON, RETICCTPCT in the last 72 hours. Urine analysis:    Component Value Date/Time   COLORURINE YELLOW 12/23/2021 1120   APPEARANCEUR CLEAR 12/23/2021 1120   LABSPEC 1.015 12/23/2021 1120   PHURINE 7.5 12/23/2021 1120   GLUCOSEU 250 (A) 12/23/2021 1120   HGBUR NEGATIVE 12/23/2021 1120   HGBUR small 11/05/2010 0941   BILIRUBINUR NEGATIVE 12/23/2021 1120   BILIRUBINUR 1+ 06/07/2021 1403   KETONESUR NEGATIVE 12/23/2021 1120   PROTEINUR NEGATIVE 12/23/2021 1120   UROBILINOGEN 2.0 (A) 06/07/2021 1403   UROBILINOGEN negative 11/05/2010 0941   NITRITE NEGATIVE 12/23/2021 1120   LEUKOCYTESUR NEGATIVE 12/23/2021 1120    Radiological Exams on Admission: DG Chest 2 View  Result Date: 01/24/2022 CLINICAL DATA:  Chest pain. Cardiac surgery approximately 1 month ago. EXAM: CHEST - 2 VIEW COMPARISON:  Radiographs 12/26/2021 and 12/25/2021. FINDINGS: The right IJ central venous catheter has been removed in the interval. The heart size and mediastinal contours are stable status post median sternotomy and CABG. There is an enlarging sub pulmonic pleural effusion on the left, now moderate in volume. There is associated compressive atelectasis at the left lung base. The right lung  is clear. There is no pneumothorax. The bones appear unchanged. IMPRESSION: Enlarging moderate-sized subpulmonic left pleural effusion with associated left basilar atelectasis. No pneumothorax. Electronically Signed   By: Richardean Sale M.D.   On: 01/24/2022 14:52   CT Chest W Contrast  Result Date: 01/24/2022 CLINICAL DATA:  Chronic dyspnea. EXAM: CT CHEST WITH CONTRAST TECHNIQUE: Multidetector CT imaging of the chest was performed during intravenous contrast administration. RADIATION DOSE REDUCTION: This exam was performed according to the departmental dose-optimization program which includes automated exposure control, adjustment of the mA and/or kV according to patient size and/or use of iterative reconstruction technique. CONTRAST:  69mL OMNIPAQUE IOHEXOL 300 MG/ML  SOLN COMPARISON:  November 13, 2020. FINDINGS: Cardiovascular: Atherosclerosis of thoracic aorta is noted without aneurysm or dissection. Status post coronary artery bypass graft. Normal cardiac size. Small pericardial effusion is noted. Mediastinum/Nodes: No enlarged mediastinal, hilar, or axillary lymph nodes. Thyroid gland, trachea, and esophagus demonstrate no significant findings. Lungs/Pleura: No pneumothorax is noted. Right lung is clear. Large left pleural effusion is noted with atelectasis of the left lower lobe and subsegmental atelectasis of the left upper lobe. Upper Abdomen: No acute abnormality. Musculoskeletal: No chest wall abnormality. No acute or significant osseous findings. IMPRESSION: Large left pleural effusion is noted with atelectasis of the left lower lobe and subsegmental atelectasis of the left upper lobe. Small pericardial effusion. Aortic Atherosclerosis (ICD10-I70.0). Electronically Signed   By: Marijo Conception M.D.   On: 01/24/2022 18:23     EKG: Independently reviewed, with result as described above.    Assessment/Plan  Principal Problem:   Pleural effusion on left Active Problems:   Diabetes  mellitus, type II (HCC)   Hyperlipidemia LDL goal <70   Depression   Gastroesophageal reflux disease   Coronary artery disease   Atypical chest pain    #) Large left pleural effusion: In the context of 1 week of progressive atypical left-sided chest discomfort associated with shortness of breath/orthopnea, CT chest shows evidence of a large left pleural effusion associated with atelectasis involving the left lower lobe in the absence of any associated interstitial edema, pulmonary edema, infiltrate, or pneumothorax.  Suspect a transudative process in the setting of recent CABG x2 in December 2022, as further detailed above.  Does not appear to be in any respiratory distress at this time, and is maintaining O2 sats in the high 90s on room air.  Cardiology has been consulted, as further detailed above, recommending admission to the hospital service at [facility] for further evaluation management of large left pleural effusion/atypical chest pain, with plan for cardiology consult.  Would also benefit from diagnostic/therapeutic thoracentesis via interventional radiology in the morning.  In terms of noncardiac etiologies contributing to presenting large left pleural effusion, no clinical or radiographic evidence to suggest underlying pneumonia leading to a parapneumonic effusion, particular in the absence of any infiltrate on chest x-ray or CT chest, as well as the absence of constitutional symptoms that would otherwise suggest underlying infection.  We will add on procalcitonin to further evaluate.  Of note, the patient is on a full dose aspirin on a daily basis as an outpatient, with most recent dose occurring on the morning of 01/24/2022.  In the setting of recent CABG, will continue home aspirin, but will reschedule for later tomorrow afternoon with anticipation of aforementioned thoracentesis occurring in the morning.   Plan: IR consulted for nonemergent diagnostic/therapeutic left-sided thoracentesis.   Additionally orders been placed for pleural fluid analysis, with these pleural fluid studies including the following: Culture and gram stain, cell count with differential, cytology, LDH, amylase, glucose, protein, triglycerides, pH.  Additionally will check LDH for further evaluation of lights criteria.  CMP in the morning, including for total serum protein for this purpose as well.  Check INR.  Check procalcitonin.  Repeat CBC in the morning.       #) Atypical chest pain: 1 week of intermittent left anterior/left anterior lateral chest wall discomfort reproducible with palpation over the left anterior chest wall and noted to be pleuritic in nature while also nonexertional, and, overall, appearing atypical for ACS in light of these features.  Suspect contribution from presenting large left pleural effusion given these symptoms and interval development worsening of left pleural effusion relative to plain films from late December 2022, as further detailed above.  Of note, imaging demonstrates no evidence of infiltrate to suggest pneumonia, nor any evidence of pneumothorax.  Presentation appears less suggestive of acute pulmonary embolism.  Overall, ACS appears less likely at this time given the atypical nature of the patient's chest discomfort as well as nonelevated troponin x2, which is particularly reassuring given that the patient's chest pain has been occurring over the last week, providing ample time for elevation of troponin should ACS be the underlying pathology, while EKG shows no evidence of acute ischemic changes.  Cardiology consulted, as above.   Plan: Further evaluation and management of large left pleural effusion, including diagnostic/therapeutic thoracentesis via interventional radiology consultation in the morning, as further detailed above.  Monitor on telemetry.  Continue to trend troponin.  Cardiology consulted.  Check lipase.  Procalcitonin.  CMP in the morning.  Repeat CBC in the  morning.       #) Coronary artery disease: Status post CABG x2 in December 2022.  On full dose daily aspirin, Toprol-XL, high intensity rosuvastatin.  Of note, not currently on an ACE inhibitor/ARB.  Plan: Continue home full dose aspirin, beta-blocker, high intensity rosuvastatin.         #) Essential Hypertension: documented h/o such, with outpatient antihypertensive regimen including metoprolol succinate.  SBP's in the ED today: Noted to be normotensive.   Plan: Close monitoring of subsequent BP via routine VS. continue home beta-blocker.        #) Hyperlipidemia: documented h/o such. On high intensity rosuvastatin as outpatient.    Plan: continue home statin.         #) Depression: Documented history of such, on Zoloft as an outpatient.  Per review of presenting EKG, no evidence of QTC prolongation.  Plan: Continue home Zoloft.        #) Type 2 Diabetes Mellitus: documented history of such. Home insulin regimen: None. Home oral hypoglycemic agents: Empagliflozin. presenting blood sugar: 103.    Plan: accuchecks QAC and HS with low dose SSI. hold home oral hypoglycemic agents during this hospitalization.        #) GERD: documented h/o such; on Protonix as outpatient.   Plan: continue home PPI.       DVT prophylaxis: SCD's   Code Status: DNR (per my discussions with the patient this evening) Family Communication: none  Disposition Plan: Per Rounding Team Consults called: EDP discussed the patient's case with the on-call cardiologist, who will formally consult, as further detailed above;  Admission status: Inpatient; cardiac telemetry    PLEASE NOTE THAT DRAGON DICTATION SOFTWARE WAS USED IN THE CONSTRUCTION OF THIS NOTE.   [place] DO [facility]  From [place]   01/25/2022, 1:33 AM

## 2022-01-25 NOTE — Procedures (Signed)
°  US guided Left thoracentesis  1.5 L dark red fluid Labs sent per MD  Pt tolerated well  EBL: none  Post CXR: NO PTX per Dr Denna Haggard

## 2022-01-25 NOTE — Progress Notes (Signed)
PROGRESS NOTE    Ronald Petersen  NAT:[id number] DOB: 11-Oct-1956 DOA: 01/24/2022 PCP: [name], DO    Brief Narrative:  66 year old gentleman with history of CABG in December 2022 and progressive shortness of breath since then admitted with vague left-sided chest pain and dullness.  Recently started on Lasix.  Patient has large left-sided pleural effusion which is anticipated postoperative complication.  Admitted due to symptoms.   Assessment & Plan:   Symptomatic large left pleural effusion, post CABG: -IR guided thoracentesis today.  Anticipate exudative effusion.  No clinical evidence of infection.  Will do occlude and lysis. -Chest pain is atypical and related to pleurisy.  Symptomatic treatment. -If any abnormality, will discuss and update his cardiothoracic surgeon. -Recent echocardiogram with normal ejection fraction, will less likely benefit with Lasix.    Coronary artery disease: Status post CABG.  On aspirin, Toprol-XL and high-dose statin.  No evidence of acute coronary syndrome.  Essential hypertension: Blood pressure stable.  Type 2 diabetes: Well-controlled.  On oral hypoglycemics at home.  Resume on discharge.   DVT prophylaxis: SCDs Start: 01/24/22 2328   Code Status: DNR Family Communication: None Disposition Plan: Status is: Inpatient  Remains inpatient appropriate because: Significant symptoms due to large left pleural effusion.  Inpatient procedures planned.         Consultants:  None  Procedures:  Thoracentesis planned  Antimicrobials:  None   Subjective: Patient seen and examined.  No overnight events.  He has left chest wall pain and sometimes goes to left arm especially with movement and twisting.  He also has a history of back pain.  No shortness of breath at rest.  He does get dyspneic on walking to the bathroom and hallway.  Denies any cough, congestion, fever or sputum production.  Objective: Vitals:   01/25/22 0200  01/25/22 0312 01/25/22 0539 01/25/22 0700  BP: 124/79 110/76  114/71  Pulse: 69 72  68  Resp: 14 16  14   Temp:  97.9 F (36.6 C)  97.9 F (36.6 C)  TempSrc:  Oral  Oral  SpO2: 94% 93%  95%  Weight:   82.1 kg   Height:        Intake/Output Summary (Last 24 hours) at 01/25/2022 0942 Last data filed at 01/25/2022 0313 Gross per 24 hour  Intake --  Output 1400 ml  Net -1400 ml   Filed Weights   01/24/22 1401 01/25/22 0539  Weight: 83.5 kg 82.1 kg    Examination:  General exam: Appears calm and comfortable  Respiratory system: No air entry at left base and midlung field.  No added sounds. Cardiovascular system: S1 & S2 heard, RRR. No JVD, murmurs, rubs, gallops or clicks. No pedal edema. Gastrointestinal system: Abdomen is nondistended, soft and nontender. No organomegaly or masses felt. Normal bowel sounds heard. Central nervous system: Alert and oriented. No focal neurological deficits. Extremities: Symmetric 5 x 5 power. Skin: No rashes, lesions or ulcers Psychiatry: Judgement and insight appear normal. Mood & affect appropriate.     Data Reviewed: I have personally reviewed following labs and imaging studies  CBC: Recent Labs  Lab 01/24/22 1419 01/25/22 0120  WBC 7.3 7.0  NEUTROABS  --  3.9  HGB 14.3 13.3  HCT 44.7 42.2  MCV 85.1 85.8  PLT 286 427   Basic Metabolic Panel: Recent Labs  Lab 01/24/22 1419 01/25/22 0120  NA 136 139  K 4.2 4.0  CL 99 104  CO2 27 25  GLUCOSE 103*  92  BUN 29* 21  CREATININE 1.03 1.09  CALCIUM 9.6 9.5  MG 2.2 2.1  PHOS  --  4.0   GFR: Estimated Creatinine Clearance: 71 mL/min (by C-G formula based on SCr of 1.09 mg/dL). Liver Function Tests: Recent Labs  Lab 01/25/22 0120  AST 19  ALT 13  ALKPHOS 69  BILITOT 0.5  PROT 7.2  ALBUMIN 3.7   Recent Labs  Lab 01/25/22 0120  LIPASE 31   No results for input(s): AMMONIA in the last 168 hours. Coagulation Profile: Recent Labs  Lab 01/25/22 0120  INR 1.1    Cardiac Enzymes: No results for input(s): CKTOTAL, CKMB, CKMBINDEX, TROPONINI in the last 168 hours. BNP (last 3 results) No results for input(s): PROBNP in the last 8760 hours. HbA1C: No results for input(s): HGBA1C in the last 72 hours. CBG: Recent Labs  Lab 01/25/22 0824  GLUCAP 86   Lipid Profile: No results for input(s): CHOL, HDL, LDLCALC, TRIG, CHOLHDL, LDLDIRECT in the last 72 hours. Thyroid Function Tests: No results for input(s): TSH, T4TOTAL, FREET4, T3FREE, THYROIDAB in the last 72 hours. Anemia Panel: No results for input(s): VITAMINB12, FOLATE, FERRITIN, TIBC, IRON, RETICCTPCT in the last 72 hours. Sepsis Labs: Recent Labs  Lab 01/25/22 0120  PROCALCITON <0.10    Recent Results (from the past 240 hour(s))  Resp Panel by RT-PCR (Flu A&B, Covid) Nasopharyngeal Swab     Status: None   Collection Time: 01/24/22  7:48 PM   Specimen: Nasopharyngeal Swab; Nasopharyngeal(NP) swabs in vial transport medium  Result Value Ref Range Status   SARS Coronavirus 2 by RT PCR NEGATIVE NEGATIVE Final    Comment: (NOTE) SARS-CoV-2 target nucleic acids are NOT DETECTED.  The SARS-CoV-2 RNA is generally detectable in upper respiratory specimens during the acute phase of infection. The lowest concentration of SARS-CoV-2 viral copies this assay can detect is 138 copies/mL. A negative result does not preclude SARS-Cov-2 infection and should not be used as the sole basis for treatment or other patient management decisions. A negative result may occur with  improper specimen collection/handling, submission of specimen other than nasopharyngeal swab, presence of viral mutation(s) within the areas targeted by this assay, and inadequate number of viral copies(<138 copies/mL). A negative result must be combined with clinical observations, patient history, and epidemiological information. The expected result is Negative.  Fact Sheet for Patients:   EntrepreneurPulse.com.au  Fact Sheet for Healthcare Providers:  IncredibleEmployment.be  This test is no t yet approved or cleared by the [place] FDA and  has been authorized for detection and/or diagnosis of SARS-CoV-2 by FDA under an Emergency Use Authorization (EUA). This EUA will remain  in effect (meaning this test can be used) for the duration of the COVID-19 declaration under Section 564(b)(1) of the Act, 21 U.S.C.section 360bbb-3(b)(1), unless the authorization is terminated  or revoked sooner.       Influenza A by PCR NEGATIVE NEGATIVE Final   Influenza B by PCR NEGATIVE NEGATIVE Final    Comment: (NOTE) The Xpert Xpress SARS-CoV-2/FLU/RSV plus assay is intended as an aid in the diagnosis of influenza from Nasopharyngeal swab specimens and should not be used as a sole basis for treatment. Nasal washings and aspirates are unacceptable for Xpert Xpress SARS-CoV-2/FLU/RSV testing.  Fact Sheet for Patients: EntrepreneurPulse.com.au  Fact Sheet for Healthcare Providers: IncredibleEmployment.be  This test is not yet approved or cleared by the [place] FDA and has been authorized for detection and/or diagnosis of SARS-CoV-2 by FDA under an Emergency  Use Authorization (EUA). This EUA will remain in effect (meaning this test can be used) for the duration of the COVID-19 declaration under Section 564(b)(1) of the Act, 21 U.S.C. section 360bbb-3(b)(1), unless the authorization is terminated or revoked.  Performed at [facility], [address] [place], [place] [address]   MRSA Next Gen by PCR, Nasal     Status: None   Collection Time: 01/24/22 11:57 PM   Specimen: Nasal Mucosa; Nasal Swab  Result Value Ref Range Status   MRSA by PCR Next Gen NOT DETECTED NOT DETECTED Final    Comment: (NOTE) The GeneXpert MRSA Assay (FDA approved for NASAL specimens only), is one component of  a comprehensive MRSA colonization surveillance program. It is not intended to diagnose MRSA infection nor to guide or monitor treatment for MRSA infections. Test performance is not FDA approved in patients less than 50 years old. Performed at [facility], [place] [address], [place], [place] [address]          Radiology Studies: DG Chest 2 View  Result Date: 01/24/2022 CLINICAL DATA:  Chest pain. Cardiac surgery approximately 1 month ago. EXAM: CHEST - 2 VIEW COMPARISON:  Radiographs 12/26/2021 and 12/25/2021. FINDINGS: The right IJ central venous catheter has been removed in the interval. The heart size and mediastinal contours are stable status post median sternotomy and CABG. There is an enlarging sub pulmonic pleural effusion on the left, now moderate in volume. There is associated compressive atelectasis at the left lung base. The right lung is clear. There is no pneumothorax. The bones appear unchanged. IMPRESSION: Enlarging moderate-sized subpulmonic left pleural effusion with associated left basilar atelectasis. No pneumothorax. Electronically Signed   By: Richardean Sale M.D.   On: 01/24/2022 14:52   CT Chest W Contrast  Result Date: 01/24/2022 CLINICAL DATA:  Chronic dyspnea. EXAM: CT CHEST WITH CONTRAST TECHNIQUE: Multidetector CT imaging of the chest was performed during intravenous contrast administration. RADIATION DOSE REDUCTION: This exam was performed according to the departmental dose-optimization program which includes automated exposure control, adjustment of the mA and/or kV according to patient size and/or use of iterative reconstruction technique. CONTRAST:  21mL OMNIPAQUE IOHEXOL 300 MG/ML  SOLN COMPARISON:  November 13, 2020. FINDINGS: Cardiovascular: Atherosclerosis of thoracic aorta is noted without aneurysm or dissection. Status post coronary artery bypass graft. Normal cardiac size. Small pericardial effusion is noted. Mediastinum/Nodes: No enlarged mediastinal,  hilar, or axillary lymph nodes. Thyroid gland, trachea, and esophagus demonstrate no significant findings. Lungs/Pleura: No pneumothorax is noted. Right lung is clear. Large left pleural effusion is noted with atelectasis of the left lower lobe and subsegmental atelectasis of the left upper lobe. Upper Abdomen: No acute abnormality. Musculoskeletal: No chest wall abnormality. No acute or significant osseous findings. IMPRESSION: Large left pleural effusion is noted with atelectasis of the left lower lobe and subsegmental atelectasis of the left upper lobe. Small pericardial effusion. Aortic Atherosclerosis (ICD10-I70.0). Electronically Signed   By: Marijo Conception M.D.   On: 01/24/2022 18:23        Scheduled Meds:  aspirin  325 mg Oral Daily   ferrous [id number] C-folic acid  1 capsule Oral BID PC   furosemide  40 mg Oral Daily   gabapentin  800 mg Oral TID   insulin aspart  0-6 Units Subcutaneous TID WC   lidocaine (PF)       metoprolol succinate  12.5 mg Oral Daily   pantoprazole  40 mg Oral Daily   rosuvastatin  40 mg Oral Daily   sertraline  100 mg Oral Daily   Continuous Infusions:   LOS: 1 day    Time spent: 30 minutes.    Barb Merino, MD [facility] Pager [phone]

## 2022-01-27 ENCOUNTER — Other Ambulatory Visit (HOSPITAL_BASED_OUTPATIENT_CLINIC_OR_DEPARTMENT_OTHER): Payer: Self-pay

## 2022-01-27 ENCOUNTER — Encounter: Payer: Self-pay | Admitting: Cardiology

## 2022-01-27 ENCOUNTER — Telehealth: Payer: Self-pay

## 2022-01-27 LAB — TRIGLYCERIDES, BODY FLUIDS: Triglycerides, Fluid: 42 mg/dL

## 2022-01-27 LAB — CYTOLOGY - NON PAP

## 2022-01-27 NOTE — Telephone Encounter (Signed)
Transition Care Management Follow-up Telephone Call Date of discharge and from where: 01/25/2022 -[place] How have you been since you were released from the hospital? Doing ok but still having some pain. Any questions or concerns? No  Items Reviewed: Did the pt receive and understand the discharge instructions provided? Yes  Medications obtained and verified? Yes  Other? Yes  Any new allergies since your discharge? No  Dietary orders reviewed? Yes Do you have support at home? Yes   Home Care and Equipment/Supplies: Were home health services ordered? no If so, what is the name of the agency? N/a  Has the agency set up a time to come to the patient's home? not applicable Were any new equipment or medical supplies ordered?  No What is the name of the medical supply agency? N/a Were you able to get the supplies/equipment? not applicable Do you have any questions related to the use of the equipment or supplies? N/a    Functional Questionnaire: (I = Independent and D = Dependent) ADLs: I  Bathing/Dressing- I  Meal Prep- I  Eating- I  Maintaining continence- I  Transferring/Ambulation- I  Managing Meds- I  Follow up appointments reviewed:  PCP Hospital f/u appt confirmed? Yes  Scheduled to see Dr. Etter Sjogren on 01/31/2022 @ 2:40. [place] [facility] f/u appt confirmed? Yes  Scheduled to see  on Dr. Kipp Brood on 01/30/2022 @ 2:00 . Are transportation arrangements needed? No  If their condition worsens, is the pt aware to call PCP or go to the Emergency Dept.? Yes Was the patient provided with contact information for the PCP's office or ED? Yes Was to pt encouraged to call back with questions or concerns? Yes

## 2022-01-28 LAB — BODY FLUID CULTURE W GRAM STAIN: Culture: NO GROWTH

## 2022-01-29 ENCOUNTER — Other Ambulatory Visit: Payer: Self-pay | Admitting: Cardiology

## 2022-01-29 ENCOUNTER — Other Ambulatory Visit: Payer: Self-pay | Admitting: Thoracic Surgery (Cardiothoracic Vascular Surgery)

## 2022-01-29 ENCOUNTER — Other Ambulatory Visit: Payer: Self-pay | Admitting: Family Medicine

## 2022-01-29 DIAGNOSIS — Z951 Presence of aortocoronary bypass graft: Secondary | ICD-10-CM

## 2022-01-29 DIAGNOSIS — E1169 Type 2 diabetes mellitus with other specified complication: Secondary | ICD-10-CM

## 2022-01-29 LAB — CHOLESTEROL, BODY FLUID: Cholesterol, Fluid: 78 mg/dL

## 2022-01-30 ENCOUNTER — Other Ambulatory Visit (HOSPITAL_BASED_OUTPATIENT_CLINIC_OR_DEPARTMENT_OTHER): Payer: Self-pay

## 2022-01-30 ENCOUNTER — Other Ambulatory Visit: Payer: Self-pay

## 2022-01-30 ENCOUNTER — Ambulatory Visit (INDEPENDENT_AMBULATORY_CARE_PROVIDER_SITE_OTHER): Payer: Self-pay | Admitting: Surgical

## 2022-01-30 ENCOUNTER — Other Ambulatory Visit: Payer: Self-pay | Admitting: *Deleted

## 2022-01-30 ENCOUNTER — Ambulatory Visit
Admission: RE | Admit: 2022-01-30 | Discharge: 2022-01-30 | Disposition: A | Discharge status: Home / Self Care | Payer: Medicare HMO | Source: Ambulatory Visit | Attending: Thoracic Surgery (Cardiothoracic Vascular Surgery) | Admitting: Thoracic Surgery (Cardiothoracic Vascular Surgery)

## 2022-01-30 ENCOUNTER — Ambulatory Visit: Payer: Medicare HMO | Admitting: Family Medicine

## 2022-01-30 VITALS — BP 117/78 | HR 68 | Resp 20 | Ht 71.0 in | Wt 183.0 lb

## 2022-01-30 DIAGNOSIS — J9811 Atelectasis: Secondary | ICD-10-CM | POA: Diagnosis not present

## 2022-01-30 DIAGNOSIS — I10 Essential (primary) hypertension: Secondary | ICD-10-CM | POA: Diagnosis not present

## 2022-01-30 DIAGNOSIS — Z951 Presence of aortocoronary bypass graft: Secondary | ICD-10-CM

## 2022-01-30 DIAGNOSIS — E119 Type 2 diabetes mellitus without complications: Secondary | ICD-10-CM | POA: Diagnosis not present

## 2022-01-30 DIAGNOSIS — J90 Pleural effusion, not elsewhere classified: Secondary | ICD-10-CM | POA: Diagnosis not present

## 2022-01-30 MED ORDER — ROSUVASTATIN CALCIUM 40 MG PO TABS
ORAL_TABLET | Freq: Every day | ORAL | 3 refills | Status: DC
  Filled 2022-01-30: qty 90, 90d supply, fill #0
  Filled 2022-05-07: qty 90, 90d supply, fill #1

## 2022-01-30 MED ORDER — FENOFIBRATE 160 MG PO TABS
ORAL_TABLET | Freq: Every day | ORAL | 1 refills | Status: DC
  Filled 2022-01-30: qty 90, 90d supply, fill #0
  Filled 2022-05-07: qty 90, 90d supply, fill #1

## 2022-01-30 NOTE — Progress Notes (Signed)
[address]       [place] [address]             [phone]      Emannuel D Schwoerer [place] Medical Record #[id number] Date of Birth: [birth year]  Referring: [name], * Primary Care: [name], DO Primary Cardiologist: Berniece Salines, DO   Chief Complaint:   POST OP FOLLOW UP Patient:  Ronald Petersen Pre-Op Dx: Coronary artery disease                         Exertional angina                         Hypertension                         Diabetes mellitus                         Hyperlipidemia Post-op Dx: Same Procedure: CABG X 2.  LIMA to LAD.  Reverse saphenous vein graft to obtuse marginal Endoscopic greater saphenous vein harvest on the right     Surgeon and Role:      * Lightfoot, Lucile Crater, MD - Primary    Evonnie Pat- assisting History of Present Illness:    Patient is a 66 year old male status post the above described procedure seen in the office on today's date and postsurgical follow-up.  It is noted he was readmitted last week to the hospitalist service with significant shortness of breath and had a notable large left-sided pleural effusion.  1500 cc of dark red fluid were drained by interventional radiology.  It was exudative by lights criteria however no evidence of infection or loculated empyema.  He does feel as though he is getting somewhat more short of breath over time since that procedure.  He has had some pain in his right lower leg EVH but it is improved with time.  His sternotomy incision has minimal pain.  He does have chronic neck pain and was scheduled for cervical spine surgery once cleared from cardiac viewpoint.  He is not having fevers chills or other significant constitutional symptoms with the exception of some nausea.  Activity level is progressing with time.      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   Cancer [facility])     Complication of anesthesia    aspirated with back surgery at age 81   Depression    Diabetes mellitus Type II    GERD (gastroesophageal reflux disease)    Hyperlipidemia    Hypertension    Neuromuscular disorder ([place])    NEUROPATHY   Right rotator cuff tear 11/23/2018   Sleep apnea    no CPAP     Social History   Tobacco Use  Smoking Status Former   Years: 16.00   Types: Cigarettes   Quit date: 12/29/1985   Years since quitting: 36.1  Smokeless Tobacco Never    Social History   Substance and Sexual Activity  Alcohol Use Not Currently     Allergies  Allergen Reactions   Niacin Anaphylaxis    Current Outpatient Medications  Medication Sig Dispense Refill   acetaminophen (TYLENOL) 650 MG CR tablet  Take 650 mg by mouth every 8 (eight) hours as needed for pain.     aspirin EC 325 MG EC tablet Take 1 tablet (325 mg total) by mouth daily.     Continuous Blood Gluc Receiver (FREESTYLE LIBRE 2 READER) DEVI Use as directed to check blood sugar 1 each 0   Continuous Blood Gluc Sensor (FREESTYLE LIBRE 2 SENSOR) MISC Use to check blood sugar, change every 14 days 2 each 6   dicyclomine (BENTYL) 10 MG capsule Take 1 capsule (10 mg total) by mouth every 8 (eight) hours as needed for spasms. 30 capsule 1   ELDERBERRY PO Take 300 mg by mouth daily.     empagliflozin (JARDIANCE) 10 MG TABS tablet Take 1 tablet (10 mg total) by mouth daily. 30 tablet 1   fenofibrate 160 MG tablet TAKE 1 TABLET (160 MG TOTAL) BY MOUTH DAILY. 90 tablet 1   ferrous [id number] C-folic acid (TRINSICON / FOLTRIN) capsule Take 1 capsule by mouth 2 (two) times daily after a meal. 60 capsule 1   furosemide (LASIX) 40 MG tablet Take 1 tablet (40 mg total) by mouth daily. 90 tablet 1   gabapentin (NEURONTIN) 800 MG tablet Take 1 tablet (800 mg total) by mouth 3 (three) times daily. 270 tablet 2   glucose blood (ONETOUCH VERIO) test strip USE AS INSTRUCTED TO CHECK BLOOD SUGAR ONCE A DAY 100 strip 12    Magnesium Citrate 200 MG TABS Take 400 mg by mouth daily.     metoprolol succinate (TOPROL XL) 25 MG 24 hr tablet Take 1/2 tablet (12.5 mg total) by mouth daily. 45 tablet 1   ondansetron (ZOFRAN) 4 MG tablet Take 1 tablet (4 mg total) by mouth every 8 (eight) hours as needed. 20 tablet 2   pantoprazole (PROTONIX) 40 MG tablet TAKE 1 TABLET (40 MG TOTAL) BY  MOUTH ONCE A DAY 60 tablet 3   polyethylene glycol powder (GLYCOLAX/MIRALAX) 17 GM/SCOOP powder Take 17 g by mouth 2 (two) times daily as needed for moderate constipation.     potassium chloride SA (KLOR-CON M) 20 MEQ tablet Take 1 tablet by mouth daily for three days then resume taking 1 tablet every other day 7 tablet 0   rosuvastatin (CRESTOR) 40 MG tablet TAKE 1 TABLET BY MOUTH ONCE DAILY 90 tablet 3   sertraline (ZOLOFT) 100 MG tablet TAKE ONE AND ONE-HALF TABLET BY MOUTH ONCE DAILY (Patient taking differently: Take 150 mg by mouth daily.) 135 tablet 3   tiZANidine (ZANAFLEX) 4 MG tablet TAKE 1 TABLET (4 MG TOTAL) BY MOUTH EVERY 6 (SIX) HOURS AS NEEDED FOR MUSCLE SPASMS. 30 tablet 1   traZODone (DESYREL) 50 MG tablet Take 1 tablet (50 mg total) by mouth at bedtime as needed for sleep. 30 tablet 3   vitamin E 180 MG (400 UNITS) capsule Take 1,600 Units by mouth daily.     No current facility-administered medications for this visit.       Physical Exam: BP 117/78    Pulse 68    Resp 20    Ht 5\' 11"  (1.803 m)    Wt 183 lb (83 kg)    SpO2 97% Comment: RA   BMI 25.52 kg/m   General appearance: alert, cooperative, and no distress Heart: regular rate and rhythm Lungs: Diminished left base Abdomen: Soft nontender he does have an umbilical hernia which is easily reducible. Extremities: No edema Wound: Incisions well-healed without evidence of infection   Diagnostic Studies & Laboratory data:  Recent Radiology Findings:   No results found.    Recent Lab Findings: Lab Results  Component Value Date   WBC 7.0 01/25/2022   HGB  13.3 01/25/2022   HCT 42.2 01/25/2022   PLT 243 01/25/2022   GLUCOSE 92 01/25/2022   CHOL 105 12/02/2021   TRIG 111.0 12/02/2021   HDL 46.40 12/02/2021   LDLDIRECT 93.0 05/31/2020   LDLCALC 36 12/02/2021   ALT 13 01/25/2022   AST 19 01/25/2022   NA 139 01/25/2022   K 4.0 01/25/2022   CL 104 01/25/2022   CREATININE 1.09 01/25/2022   BUN 21 01/25/2022   CO2 25 01/25/2022   TSH 1.82 02/26/2021   INR 1.1 01/25/2022   HGBA1C 6.2 (H) 12/23/2021      Assessment / Plan: Recurrent left postoperative pleural effusion.  With the appearance of the effusion being dark red this could be a resolving hemothorax.  As he is getting more symptomatic over time we will schedule a repeat thoracentesis with IR.  Hopefully they will be able to drain the rest of the effusion.  Chest x-ray today does show improvement but there is still a moderate amount left.  He continues on Lasix.  I will not start prednisone at this time as I do not want him to begin to have issues with his sugars/healing.  It may need to be considered in the future.  We will see him again in 1 week with a repeat chest x-ray.      Medication Changes: No orders of the defined types were placed in this encounter.     John Giovanni, PA-C  01/30/2022 2:32 PM

## 2022-01-30 NOTE — Patient Instructions (Signed)
Thoracentesis will be scheduled.

## 2022-01-31 ENCOUNTER — Other Ambulatory Visit (HOSPITAL_COMMUNITY): Payer: Self-pay | Admitting: Physician Assistant

## 2022-01-31 ENCOUNTER — Ambulatory Visit (HOSPITAL_COMMUNITY)
Admission: RE | Admit: 2022-01-31 | Discharge: 2022-01-31 | Disposition: A | Discharge status: Home / Self Care | Payer: Medicare HMO | Source: Ambulatory Visit | Attending: Physician Assistant | Admitting: Physician Assistant

## 2022-01-31 ENCOUNTER — Ambulatory Visit (HOSPITAL_COMMUNITY)
Admission: RE | Admit: 2022-01-31 | Discharge: 2022-01-31 | Disposition: A | Discharge status: Home / Self Care | Payer: Medicare HMO | Source: Ambulatory Visit | Attending: Surgical | Admitting: Surgical

## 2022-01-31 ENCOUNTER — Other Ambulatory Visit (HOSPITAL_BASED_OUTPATIENT_CLINIC_OR_DEPARTMENT_OTHER): Payer: Self-pay

## 2022-01-31 ENCOUNTER — Encounter (HOSPITAL_COMMUNITY): Payer: Self-pay | Admitting: Physician Assistant

## 2022-01-31 ENCOUNTER — Ambulatory Visit (INDEPENDENT_AMBULATORY_CARE_PROVIDER_SITE_OTHER): Payer: Medicare HMO | Admitting: Family Medicine

## 2022-01-31 VITALS — BP 104/70 | HR 66 | Temp 97.9°F | Resp 20 | Ht 71.0 in | Wt 183.0 lb

## 2022-01-31 DIAGNOSIS — M542 Cervicalgia: Secondary | ICD-10-CM | POA: Diagnosis not present

## 2022-01-31 DIAGNOSIS — J90 Pleural effusion, not elsewhere classified: Secondary | ICD-10-CM

## 2022-01-31 DIAGNOSIS — Z951 Presence of aortocoronary bypass graft: Secondary | ICD-10-CM

## 2022-01-31 DIAGNOSIS — Z48813 Encounter for surgical aftercare following surgery on the respiratory system: Secondary | ICD-10-CM | POA: Diagnosis not present

## 2022-01-31 DIAGNOSIS — M4802 Spinal stenosis, cervical region: Secondary | ICD-10-CM

## 2022-01-31 HISTORY — PX: IR THORACENTESIS ASP PLEURAL SPACE W/IMG GUIDE: IMG5380

## 2022-01-31 MED ORDER — OXYCODONE HCL 5 MG PO TABS
5.0000 mg | ORAL_TABLET | Freq: Four times a day (QID) | ORAL | 0 refills | Status: AC | PRN
  Filled 2022-01-31: qty 28, 7d supply, fill #0

## 2022-01-31 MED ORDER — LIDOCAINE HCL 1 % IJ SOLN
INTRAMUSCULAR | Status: AC
  Administered 2022-01-31: 10 mL
  Filled 2022-01-31: qty 20

## 2022-01-31 MED ORDER — OXYCODONE HCL 5 MG PO TABS: 5.0000 mg | ORAL_TABLET | ORAL | 0 refills | Status: DC | PRN

## 2022-01-31 NOTE — Assessment & Plan Note (Signed)
S/p thoracentesis F/u cardiothoracic surgeon

## 2022-01-31 NOTE — Assessment & Plan Note (Signed)
Pt waiting for surgery

## 2022-01-31 NOTE — Progress Notes (Signed)
Subjective:   By signing my name below, I, [name], attest that this documentation has been prepared under the direction and in the presence of [name], DO. 01/31/2022     Patient ID: Ronald Petersen, male    DOB: [birth year], 66 y.o.   MRN: [id number]  Chief Complaint  Patient presents with   Hospitalization Follow-up    Unstable angina     HPI Patient is in today for a hospital f/u.  He was admitted into the ED on 01/27 after complaining of left-sided chest pain. He has a history of two-vessel CABG. He was also complaining of associated dyspnea, orthopnea and worsening LE edema.  Today, he is accompanied by his wife.  He reports he went for a f/u to cardiothoracic surgery and they drained 1.5 cc of dark red fluid with no evidence of infection or loculated empyema.  His wife reports he is having sharp back pain at the site where the fluid was drained from.  He was told he was supposed to stop taking blood thinners but his children were told he should start them again.  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   Cancer [facility])    Complication of anesthesia    aspirated with back surgery at age 66   Depression    Diabetes mellitus Type II    GERD (gastroesophageal reflux disease)    Hyperlipidemia    Hypertension    Neuromuscular disorder ([place])    NEUROPATHY   Right rotator cuff tear 11/23/2018   Sleep apnea    no CPAP    Past Surgical History:  Procedure Laterality Date   APPENDECTOMY     ARTHOSCOPIC ROTAOR CUFF REPAIR Right 11/23/2018   Procedure: ARTHROSCOPIC ROTATOR CUFF REPAIR;  Surgeon: [name], MD;  Location: [place];  Service: Orthopedics;  Laterality: Right;   CHOLECYSTECTOMY     CORONARY ARTERY BYPASS GRAFT N/A 12/24/2021   x2 LIMA to LAD; SVG to Obtuse Marginal   CORONARY STENT INTERVENTION N/A 11/19/2020   Procedure: CORONARY  STENT INTERVENTION;  Surgeon: Nelva Bush, MD;  Location: [place] [facility];  Service: Cardiovascular;  Laterality: N/A;   ENDOVEIN HARVEST OF GREATER SAPHENOUS VEIN Right 12/24/2021   Procedure: ENDOVEIN HARVEST OF GREATER SAPHENOUS VEIN;  Surgeon: Lajuana Matte, MD;  Location: [place];  Service: Open Heart Surgery;  Laterality: Right;   INTRAVASCULAR ULTRASOUND/IVUS N/A 11/19/2020   Procedure: Intravascular Ultrasound/IVUS;  Surgeon: Nelva Bush, MD;  Location: [place] [facility];  Service: Cardiovascular;  Laterality: N/A;   IR THORACENTESIS ASP PLEURAL SPACE W/IMG GUIDE  01/31/2022   LEFT HEART CATH AND CORONARY ANGIOGRAPHY N/A 11/19/2020   Procedure: LEFT HEART CATH AND CORONARY ANGIOGRAPHY;  Surgeon: Nelva Bush, MD;  Location: [place] [facility];  Service: Cardiovascular;  Laterality: N/A;   LEFT HEART CATH AND CORONARY ANGIOGRAPHY N/A 12/24/2020   Procedure: LEFT HEART CATH AND CORONARY ANGIOGRAPHY;  Surgeon: Martinique, Peter M, MD;  Location: [place] [facility];  Service: Cardiovascular;  Laterality: N/A;   LEFT HEART CATH AND CORONARY ANGIOGRAPHY N/A 12/16/2021   Procedure: LEFT HEART CATH AND CORONARY ANGIOGRAPHY;  Surgeon: Leonie Man, MD;  Location: [place] [facility];  Service: Cardiovascular;  Laterality: N/A;   LUMBAR LAMINECTOMY     SHOULDER ARTHROSCOPY WITH ROTATOR CUFF REPAIR AND SUBACROMIAL DECOMPRESSION  Right 11/23/2018   Procedure: SHOULDER ARTHROSCOPY WITH ROTATOR CUFF REPAIR AND SUBACROMIAL DECOMPRESSION;  Surgeon: [name], MD;  Location: [place];  Service: Orthopedics;  Laterality: Right;   TEE WITHOUT CARDIOVERSION N/A 12/24/2021   Procedure: TRANSESOPHAGEAL ECHOCARDIOGRAM (TEE);  Surgeon: Lajuana Matte, MD;  Location: [place];  Service: Open Heart Surgery;  Laterality: N/A;    Family History  Problem Relation Age of Onset   COPD Mother    Stroke Father    Ovarian cancer Sister    Stomach cancer Sister    Heart  disease Sister        MI   Heart disease Sister        MI   Stomach cancer Maternal Grandmother    Alcohol abuse Other    Depression Other    Arthritis Other    Hypertension Other    Coronary artery disease Other    Ovarian cancer Other        neice   Ovarian cancer Other        neice   Colon polyps Neg Hx    Colon cancer Neg Hx     Social History   Socioeconomic History   Marital status: Married    Spouse name: Lorriane Shire   Number of children: 2   Years of education: Not on file   Highest education level: Not on file  Occupational History   Occupation: self employed    Fish farm manager: UNEMPLOYED  Tobacco Use   Smoking status: Former    Years: 16.00    Types: Cigarettes    Quit date: 12/29/1985    Years since quitting: 36.1   Smokeless tobacco: Never  Vaping Use   Vaping Use: Never used  Substance and Sexual Activity   Alcohol use: Not Currently   Drug use: No   Sexual activity: Yes    Partners: Female  Other Topics Concern   Not on file  Social History Narrative   Exercise-- 3 days   Pt has hs degree   Right handed   One story home   Drinks caffeine   Social Determinants of Health   Financial Resource Strain: Not on file  Food Insecurity: Not on file  Transportation Needs: Not on file  Physical Activity: Not on file  Stress: Not on file  Social Connections: Not on file  Intimate Partner Violence: Not on file    Outpatient Medications Prior to Visit  Medication Sig Dispense Refill   acetaminophen (TYLENOL) 650 MG CR tablet Take 650 mg by mouth every 8 (eight) hours as needed for pain.     aspirin EC 325 MG EC tablet Take 1 tablet (325 mg total) by mouth daily.     Continuous Blood Gluc Receiver (FREESTYLE LIBRE 2 READER) DEVI Use as directed to check blood sugar 1 each 0   Continuous Blood Gluc Sensor (FREESTYLE LIBRE 2 SENSOR) MISC Use to check blood sugar, change every 14 days 2 each 6   dicyclomine (BENTYL) 10 MG capsule Take 1 capsule (10 mg total) by  mouth every 8 (eight) hours as needed for spasms. 30 capsule 1   ELDERBERRY PO Take 300 mg by mouth daily.     empagliflozin (JARDIANCE) 10 MG TABS tablet Take 1 tablet (10 mg total) by mouth daily. 30 tablet 1   fenofibrate 160 MG tablet TAKE 1 TABLET (160 MG TOTAL) BY MOUTH DAILY. 90 tablet 1   ferrous [id number] C-folic acid (TRINSICON / FOLTRIN) capsule Take 1 capsule by  mouth 2 (two) times daily after a meal. 60 capsule 1   furosemide (LASIX) 40 MG tablet Take 1 tablet (40 mg total) by mouth daily. 90 tablet 1   gabapentin (NEURONTIN) 800 MG tablet Take 1 tablet (800 mg total) by mouth 3 (three) times daily. 270 tablet 2   glucose blood (ONETOUCH VERIO) test strip USE AS INSTRUCTED TO CHECK BLOOD SUGAR ONCE A DAY 100 strip 12   Magnesium Citrate 200 MG TABS Take 400 mg by mouth daily.     metoprolol succinate (TOPROL XL) 25 MG 24 hr tablet Take 1/2 tablet (12.5 mg total) by mouth daily. 45 tablet 1   ondansetron (ZOFRAN) 4 MG tablet Take 1 tablet (4 mg total) by mouth every 8 (eight) hours as needed. 20 tablet 2   pantoprazole (PROTONIX) 40 MG tablet TAKE 1 TABLET (40 MG TOTAL) BY  MOUTH ONCE A DAY 60 tablet 3   polyethylene glycol powder (GLYCOLAX/MIRALAX) 17 GM/SCOOP powder Take 17 g by mouth 2 (two) times daily as needed for moderate constipation.     potassium chloride SA (KLOR-CON M) 20 MEQ tablet Take 1 tablet by mouth daily for three days then resume taking 1 tablet every other day 7 tablet 0   rosuvastatin (CRESTOR) 40 MG tablet TAKE 1 TABLET BY MOUTH ONCE DAILY 90 tablet 3   sertraline (ZOLOFT) 100 MG tablet TAKE ONE AND ONE-HALF TABLET BY MOUTH ONCE DAILY (Patient taking differently: Take 150 mg by mouth daily.) 135 tablet 3   tiZANidine (ZANAFLEX) 4 MG tablet TAKE 1 TABLET (4 MG TOTAL) BY MOUTH EVERY 6 (SIX) HOURS AS NEEDED FOR MUSCLE SPASMS. 30 tablet 1   traZODone (DESYREL) 50 MG tablet Take 1 tablet (50 mg total) by mouth at bedtime as needed for sleep. 30 tablet 3    vitamin E 180 MG (400 UNITS) capsule Take 1,600 Units by mouth daily.     No facility-administered medications prior to visit.    Allergies  Allergen Reactions   Niacin Anaphylaxis    Review of Systems  Constitutional:  Positive for weight loss. Negative for fever.  HENT:  Negative for congestion, ear pain, hearing loss, sinus pain and sore throat.   Eyes:  Negative for blurred vision and pain.  Respiratory:  Negative for cough, sputum production, shortness of breath and wheezing.   Cardiovascular:  Negative for chest pain and palpitations.  Gastrointestinal:  Negative for blood in stool, constipation, diarrhea, nausea and vomiting.  Genitourinary:  Negative for dysuria, frequency, hematuria and urgency.  Musculoskeletal:  Positive for back pain (site of drainage). Negative for falls and myalgias.  Neurological:  Negative for dizziness, sensory change, loss of consciousness, weakness and headaches.  Endo/Heme/Allergies:  Negative for environmental allergies. Does not bruise/bleed easily.  Psychiatric/Behavioral:  Negative for depression and suicidal ideas. The patient is not nervous/anxious and does not have insomnia.       Objective:    Physical Exam Constitutional:      General: He is not in acute distress.    Appearance: Normal appearance. He is not ill-appearing.  HENT:     Head: Normocephalic and atraumatic.     Right Ear: Tympanic membrane, ear canal and external ear normal.     Left Ear: Tympanic membrane, ear canal and external ear normal.  Eyes:     Pupils: Pupils are equal, round, and reactive to light.  Cardiovascular:     Rate and Rhythm: Normal rate and regular rhythm.     Pulses: Normal pulses.  Heart sounds: No murmur heard.   No gallop.  Pulmonary:     Effort: Pulmonary effort is normal. No respiratory distress.     Breath sounds: Normal breath sounds. No wheezing or rhonchi.  Abdominal:     General: Bowel sounds are normal. There is no distension.      Palpations: Abdomen is soft.     Tenderness: There is no abdominal tenderness. There is no guarding.     Hernia: No hernia is present.  Musculoskeletal:     Cervical back: Neck supple.  Lymphadenopathy:     Cervical: No cervical adenopathy.  Skin:    General: Skin is warm and dry.  Neurological:     Mental Status: He is alert and oriented to person, place, and time.    BP 104/70 (BP Location: Left Arm, Patient Position: Sitting, Cuff Size: Normal)    Pulse 66    Temp 97.9 F (36.6 C) (Oral)    Resp 20    Ht _0  (1.803 m)    Wt 183 lb (83 kg)    SpO2 97%    BMI 25.52 kg/m  Wt Readings from Last 3 Encounters:  01/31/22 183 lb (83 kg)  01/30/22 183 lb (83 kg)  01/25/22 181 lb (82.1 kg)    Diabetic Foot Exam - Simple   No data filed    Lab Results  Component Value Date   WBC 7.0 01/25/2022   HGB 13.3 01/25/2022   HCT 42.2 01/25/2022   PLT 243 01/25/2022   GLUCOSE 92 01/25/2022   CHOL 105 12/02/2021   TRIG 111.0 12/02/2021   HDL 46.40 12/02/2021   LDLDIRECT 93.0 05/31/2020   LDLCALC 36 12/02/2021   ALT 13 01/25/2022   AST 19 01/25/2022   NA 139 01/25/2022   K 4.0 01/25/2022   CL 104 01/25/2022   CREATININE 1.09 01/25/2022   BUN 21 01/25/2022   CO2 25 01/25/2022   TSH 1.82 02/26/2021   PSA 1.23 06/07/2021   INR 1.1 01/25/2022   HGBA1C 6.2 (H) 12/23/2021   MICROALBUR 1.7 05/31/2020    Lab Results  Component Value Date   TSH 1.82 02/26/2021   Lab Results  Component Value Date   WBC 7.0 01/25/2022   HGB 13.3 01/25/2022   HCT 42.2 01/25/2022   MCV 85.8 01/25/2022   PLT 243 01/25/2022   Lab Results  Component Value Date   NA 139 01/25/2022   K 4.0 01/25/2022   CO2 25 01/25/2022   GLUCOSE 92 01/25/2022   BUN 21 01/25/2022   CREATININE 1.09 01/25/2022   BILITOT 0.5 01/25/2022   ALKPHOS 69 01/25/2022   AST 19 01/25/2022   ALT 13 01/25/2022   PROT 7.2 01/25/2022   ALBUMIN 3.7 01/25/2022   CALCIUM 9.5 01/25/2022   ANIONGAP 10 01/25/2022   EGFR 97  01/16/2022   GFR 83.45 12/02/2021   Lab Results  Component Value Date   CHOL 105 12/02/2021   Lab Results  Component Value Date   HDL 46.40 12/02/2021   Lab Results  Component Value Date   LDLCALC 36 12/02/2021   Lab Results  Component Value Date   TRIG 111.0 12/02/2021   Lab Results  Component Value Date   CHOLHDL 2 12/02/2021   Lab Results  Component Value Date   HGBA1C 6.2 (H) 12/23/2021       Assessment & Plan:   Problem List Items Addressed This Visit       Unprioritized   Neck pain -  Primary   Relevant Medications   oxyCODONE (OXY IR/ROXICODONE) 5 MG immediate release tablet   Foraminal stenosis of cervical region    Pt waiting for surgery      Pleural effusion    S/p thoracentesis F/u cardiothoracic surgeon         Meds ordered this encounter  Medications   oxyCODONE (ROXICODONE) 5 MG immediate release tablet    Sig: Take 1 tablet (5 mg total) by mouth every 4 (four) hours as needed for severe pain. Use only for breakthrough pain, if your regular Percocet order is not sufficient, and the Dilaudid is not sufficient.    Dispense:  30 tablet    Refill:  0   oxyCODONE (OXY IR/ROXICODONE) 5 MG immediate release tablet    Sig: Take 1 tablet (5 mg total) by mouth every 6 (six) hours as needed for up to 7 days for severe pain.    Dispense:  28 tablet    Refill:  0    I,[name],acting as a scribe for [employer], DO.,have documented all relevant documentation on the behalf of [name], DO,as directed by  [name], DO while in the presence of [name], [place], DO., personally preformed the services described in this documentation.  All medical record entries made by the scribe were at my direction and in my presence.  I have reviewed the chart and discharge instructions (if applicable) and agree that the record reflects my personal performance and is accurate and complete. 01/31/2022

## 2022-01-31 NOTE — Procedures (Signed)
PROCEDURE SUMMARY:  Successful US guided left thoracentesis. Yielded 1.5L of amber colored fluid. Pt tolerated procedure well. No immediate complications.  Specimen not sent for labs. CXR ordered.  EBL < 5 mL  Clotile Whittington PA-C 01/31/2022 12:01 PM

## 2022-02-04 ENCOUNTER — Ambulatory Visit (INDEPENDENT_AMBULATORY_CARE_PROVIDER_SITE_OTHER): Payer: Medicare HMO

## 2022-02-04 VITALS — Ht 71.0 in | Wt 183.0 lb

## 2022-02-04 DIAGNOSIS — Z0000 Encounter for general adult medical examination without abnormal findings: Secondary | ICD-10-CM

## 2022-02-04 NOTE — Patient Instructions (Signed)
[name] , Thank you for taking time to complete your Medicare Wellness Visit. I appreciate your ongoing commitment to your health goals. Please review the following plan we discussed and let me know if I can assist you in the future.   Screening recommendations/referrals: Colonoscopy: Completed 07/18/2021-Due 07/19/2031 Recommended yearly ophthalmology/optometry visit for glaucoma screening and checkup Recommended yearly dental visit for hygiene and checkup  Vaccinations: Influenza vaccine: Declined Pneumococcal vaccine: Due-May obtain vaccine at our office or your local pharmacy. Tdap vaccine: Up to date Shingles vaccine: Due-May obtain vaccine at your local pharmacy. Covid-19: Declined  Advanced directives: Per our conversation, you will pick up information at your office visit tomorrow.  Conditions/risks identified: See problem list  Next appointment: Follow up in one year for your annual wellness visit. 02/06/2023 @ 11:40 ( phone visit)  Preventive Care 67 Years and Older, Male Preventive care refers to lifestyle choices and visits with your health care provider that can promote health and wellness. What does preventive care include? A yearly physical exam. This is also called an annual well check. Dental exams once or twice a year. Routine eye exams. Ask your health care provider how often you should have your eyes checked. Personal lifestyle choices, including: Daily care of your teeth and gums. Regular physical activity. Eating a healthy diet. Avoiding tobacco and drug use. Limiting alcohol use. Practicing safe sex. Taking low doses of aspirin every day. Taking vitamin and mineral supplements as recommended by your health care provider. What happens during an annual well check? The services and screenings done by your health care provider during your annual well check will depend on your age, overall health, lifestyle risk factors, and family history of disease. Counseling   Your health care provider may ask you questions about your: Alcohol use. Tobacco use. Drug use. Emotional well-being. Home and relationship well-being. Sexual activity. Eating habits. History of falls. Memory and ability to understand (cognition). Work and work Statistician. Screening  You may have the following tests or measurements: Height, weight, and BMI. Blood pressure. Lipid and cholesterol levels. These may be checked every 5 years, or more frequently if you are over 63 years old. Skin check. Lung cancer screening. You may have this screening every year starting at age 45 if you have a 30-pack-year history of smoking and currently smoke or have quit within the past 15 years. Fecal occult blood test (FOBT) of the stool. You may have this test every year starting at age 77. Flexible sigmoidoscopy or colonoscopy. You may have a sigmoidoscopy every 5 years or a colonoscopy every 10 years starting at age 19. Prostate cancer screening. Recommendations will vary depending on your family history and other risks. Hepatitis C blood test. Hepatitis B blood test. Sexually transmitted disease (STD) testing. Diabetes screening. This is done by checking your blood sugar (glucose) after you have not eaten for a while (fasting). You may have this done every 1-3 years. Abdominal aortic aneurysm (AAA) screening. You may need this if you are a current or former smoker. Osteoporosis. You may be screened starting at age 3 if you are at high risk. Talk with your health care provider about your test results, treatment options, and if necessary, the need for more tests. Vaccines  Your health care provider may recommend certain vaccines, such as: Influenza vaccine. This is recommended every year. Tetanus, diphtheria, and acellular pertussis (Tdap, Td) vaccine. You may need a Td booster every 10 years. Zoster vaccine. You may need this after age 75.  Pneumococcal 13-valent conjugate (PCV13) vaccine.  One dose is recommended after age 70. Pneumococcal polysaccharide (PPSV23) vaccine. One dose is recommended after age 54. Talk to your health care provider about which screenings and vaccines you need and how often you need them. This information is not intended to replace advice given to you by your health care provider. Make sure you discuss any questions you have with your health care provider. Document Released: 01/11/2016 Document Revised: 09/03/2016 Document Reviewed: 10/16/2015 Elsevier Interactive Patient Education  2017 [employer] Prevention in the Home Falls can cause injuries. They can happen to people of all ages. There are many things you can do to make your home safe and to help prevent falls. What can I do on the outside of my home? Regularly fix the edges of walkways and driveways and fix any cracks. Remove anything that might make you trip as you walk through a door, such as a raised step or threshold. Trim any bushes or trees on the path to your home. Use bright outdoor lighting. Clear any walking paths of anything that might make someone trip, such as rocks or tools. Regularly check to see if handrails are loose or broken. Make sure that both sides of any steps have handrails. Any raised decks and porches should have guardrails on the edges. Have any leaves, snow, or ice cleared regularly. Use sand or salt on walking paths during winter. Clean up any spills in your garage right away. This includes oil or grease spills. What can I do in the bathroom? Use night lights. Install grab bars by the toilet and in the tub and shower. Do not use towel bars as grab bars. Use non-skid mats or decals in the tub or shower. If you need to sit down in the shower, use a plastic, non-slip stool. Keep the floor dry. Clean up any water that spills on the floor as soon as it happens. Remove soap buildup in the tub or shower regularly. Attach bath mats securely with double-sided  non-slip rug tape. Do not have throw rugs and other things on the floor that can make you trip. What can I do in the bedroom? Use night lights. Make sure that you have a light by your bed that is easy to reach. Do not use any sheets or blankets that are too big for your bed. They should not hang down onto the floor. Have a firm chair that has side arms. You can use this for support while you get dressed. Do not have throw rugs and other things on the floor that can make you trip. What can I do in the kitchen? Clean up any spills right away. Avoid walking on wet floors. Keep items that you use a lot in easy-to-reach places. If you need to reach something above you, use a strong step stool that has a grab bar. Keep electrical cords out of the way. Do not use floor polish or wax that makes floors slippery. If you must use wax, use non-skid floor wax. Do not have throw rugs and other things on the floor that can make you trip. What can I do with my stairs? Do not leave any items on the stairs. Make sure that there are handrails on both sides of the stairs and use them. Fix handrails that are broken or loose. Make sure that handrails are as long as the stairways. Check any carpeting to make sure that it is firmly attached to the stairs. Fix any  carpet that is loose or worn. Avoid having throw rugs at the top or bottom of the stairs. If you do have throw rugs, attach them to the floor with carpet tape. Make sure that you have a light switch at the top of the stairs and the bottom of the stairs. If you do not have them, ask someone to add them for you. What else can I do to help prevent falls? Wear shoes that: Do not have high heels. Have rubber bottoms. Are comfortable and fit you well. Are closed at the toe. Do not wear sandals. If you use a stepladder: Make sure that it is fully opened. Do not climb a closed stepladder. Make sure that both sides of the stepladder are locked into place. Ask  someone to hold it for you, if possible. Clearly mark and make sure that you can see: Any grab bars or handrails. First and last steps. Where the edge of each step is. Use tools that help you move around (mobility aids) if they are needed. These include: Canes. Walkers. Scooters. Crutches. Turn on the lights when you go into a dark area. Replace any light bulbs as soon as they burn out. Set up your furniture so you have a clear path. Avoid moving your furniture around. If any of your floors are uneven, fix them. If there are any pets around you, be aware of where they are. Review your medicines with your doctor. Some medicines can make you feel dizzy. This can increase your chance of falling. Ask your doctor what other things that you can do to help prevent falls. This information is not intended to replace advice given to you by your health care provider. Make sure you discuss any questions you have with your health care provider. Document Released: 10/11/2009 Document Revised: 05/22/2016 Document Reviewed: 01/19/2015 Elsevier Interactive Patient Education  2017 [employer].

## 2022-02-04 NOTE — Progress Notes (Signed)
Subjective:   Ronald Petersen is a 66 y.o. male who presents for an Initial Medicare Annual Wellness Visit.  I connected with Ronald Petersen today by telephone and verified that I am speaking with the correct person using two identifiers. Location patient: home Location provider: work Persons participating in the virtual visit: patient, Marine scientist.    I discussed the limitations, risks, security and privacy concerns of performing an evaluation and management service by telephone and the availability of in person appointments. I also discussed with the patient that there may be a patient responsible charge related to this service. The patient expressed understanding and verbally consented to this telephonic visit.    Interactive audio and video telecommunications were attempted between this provider and patient, however failed, due to patient having technical difficulties OR patient did not have access to video capability.  We continued and completed visit with audio only.  Some vital signs may be absent or patient reported.   Time Spent with patient on telephone encounter: 30 minutes   Review of Systems     Cardiac Risk Factors include: advanced age (>3men, >90 women);male gender;hypertension;diabetes mellitus;dyslipidemia     Objective:    Today's Vitals   02/04/22 1142  Weight: 183 lb (83 kg)  Height: 5\' 11"  (1.803 m)  PainSc: 2    Body mass index is 25.52 kg/m.  Advanced Directives 02/04/2022 01/24/2022 01/24/2022 12/16/2021 06/05/2021 04/25/2021 03/02/2021  Does Patient Have a Medical Advance Directive? No No No No No No No  Would patient like information on creating a medical advance directive? Yes (MAU/Ambulatory/Procedural Areas - Information given) - - No - Patient declined No - Patient declined No - Patient declined No - Patient declined    Current Medications (verified) Outpatient Encounter Medications as of 02/04/2022  Medication Sig   acetaminophen (TYLENOL) 650 MG CR tablet Take  650 mg by mouth every 8 (eight) hours as needed for pain.   aspirin EC 325 MG EC tablet Take 1 tablet (325 mg total) by mouth daily.   Continuous Blood Gluc Receiver (FREESTYLE LIBRE 2 READER) DEVI Use as directed to check blood sugar   Continuous Blood Gluc Sensor (FREESTYLE LIBRE 2 SENSOR) MISC Use to check blood sugar, change every 14 days   dicyclomine (BENTYL) 10 MG capsule Take 1 capsule (10 mg total) by mouth every 8 (eight) hours as needed for spasms.   ELDERBERRY PO Take 300 mg by mouth daily.   empagliflozin (JARDIANCE) 10 MG TABS tablet Take 1 tablet (10 mg total) by mouth daily.   fenofibrate 160 MG tablet TAKE 1 TABLET (160 MG TOTAL) BY MOUTH DAILY.   ferrous [id number] C-folic acid (TRINSICON / FOLTRIN) capsule Take 1 capsule by mouth 2 (two) times daily after a meal.   furosemide (LASIX) 40 MG tablet Take 1 tablet (40 mg total) by mouth daily.   gabapentin (NEURONTIN) 800 MG tablet Take 1 tablet (800 mg total) by mouth 3 (three) times daily.   glucose blood (ONETOUCH VERIO) test strip USE AS INSTRUCTED TO CHECK BLOOD SUGAR ONCE A DAY   Magnesium Citrate 200 MG TABS Take 400 mg by mouth daily.   metoprolol succinate (TOPROL XL) 25 MG 24 hr tablet Take 1/2 tablet (12.5 mg total) by mouth daily.   ondansetron (ZOFRAN) 4 MG tablet Take 1 tablet (4 mg total) by mouth every 8 (eight) hours as needed.   oxyCODONE (OXY IR/ROXICODONE) 5 MG immediate release tablet Take 1 tablet (5 mg total) by mouth every 6 (six)  hours as needed for up to 7 days for severe pain.   oxyCODONE (ROXICODONE) 5 MG immediate release tablet Take 1 tablet (5 mg total) by mouth every 4 (four) hours as needed for severe pain. Use only for breakthrough pain, if your regular Percocet order is not sufficient, and the Dilaudid is not sufficient.   pantoprazole (PROTONIX) 40 MG tablet TAKE 1 TABLET (40 MG TOTAL) BY  MOUTH ONCE A DAY   polyethylene glycol powder (GLYCOLAX/MIRALAX) 17 GM/SCOOP powder Take 17 g by  mouth 2 (two) times daily as needed for moderate constipation.   potassium chloride SA (KLOR-CON M) 20 MEQ tablet Take 1 tablet by mouth daily for three days then resume taking 1 tablet every other day   rosuvastatin (CRESTOR) 40 MG tablet TAKE 1 TABLET BY MOUTH ONCE DAILY   sertraline (ZOLOFT) 100 MG tablet TAKE ONE AND ONE-HALF TABLET BY MOUTH ONCE DAILY (Patient taking differently: Take 150 mg by mouth daily.)   tiZANidine (ZANAFLEX) 4 MG tablet TAKE 1 TABLET (4 MG TOTAL) BY MOUTH EVERY 6 (SIX) HOURS AS NEEDED FOR MUSCLE SPASMS.   traZODone (DESYREL) 50 MG tablet Take 1 tablet (50 mg total) by mouth at bedtime as needed for sleep.   vitamin E 180 MG (400 UNITS) capsule Take 1,600 Units by mouth daily.   No facility-administered encounter medications on file as of 02/04/2022.    Allergies (verified) Niacin   History: 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   Cancer [facility])    Complication of anesthesia    aspirated with back surgery at age 41   Depression    Diabetes mellitus Type II    GERD (gastroesophageal reflux disease)    Hyperlipidemia    Hypertension    Neuromuscular disorder ([place])    NEUROPATHY   Right rotator cuff tear 11/23/2018   Sleep apnea    no CPAP   Past Surgical History:  Procedure Laterality Date   APPENDECTOMY     ARTHOSCOPIC ROTAOR CUFF REPAIR Right 11/23/2018   Procedure: ARTHROSCOPIC ROTATOR CUFF REPAIR;  Surgeon: [name], MD;  Location: [place];  Service: Orthopedics;  Laterality: Right;   CHOLECYSTECTOMY     CORONARY ARTERY BYPASS GRAFT N/A 12/24/2021   x2 LIMA to LAD; SVG to Obtuse Marginal   CORONARY STENT INTERVENTION N/A 11/19/2020   Procedure: CORONARY STENT INTERVENTION;  Surgeon: Nelva Bush, MD;  Location: [place] [facility];  Service: Cardiovascular;  Laterality: N/A;   ENDOVEIN HARVEST OF GREATER SAPHENOUS VEIN Right 12/24/2021    Procedure: ENDOVEIN HARVEST OF GREATER SAPHENOUS VEIN;  Surgeon: Lajuana Matte, MD;  Location: [place];  Service: Open Heart Surgery;  Laterality: Right;   INTRAVASCULAR ULTRASOUND/IVUS N/A 11/19/2020   Procedure: Intravascular Ultrasound/IVUS;  Surgeon: Nelva Bush, MD;  Location: [place] [facility];  Service: Cardiovascular;  Laterality: N/A;   IR THORACENTESIS ASP PLEURAL SPACE W/IMG GUIDE  01/31/2022   LEFT HEART CATH AND CORONARY ANGIOGRAPHY N/A 11/19/2020   Procedure: LEFT HEART CATH AND CORONARY ANGIOGRAPHY;  Surgeon: Nelva Bush, MD;  Location: [place] [facility];  Service: Cardiovascular;  Laterality: N/A;   LEFT HEART CATH AND CORONARY ANGIOGRAPHY N/A 12/24/2020   Procedure: LEFT HEART CATH AND CORONARY ANGIOGRAPHY;  Surgeon: Martinique, Peter M, MD;  Location: [place] [facility];  Service: Cardiovascular;  Laterality: N/A;   LEFT HEART CATH  AND CORONARY ANGIOGRAPHY N/A 12/16/2021   Procedure: LEFT HEART CATH AND CORONARY ANGIOGRAPHY;  Surgeon: Leonie Man, MD;  Location: [place] [facility];  Service: Cardiovascular;  Laterality: N/A;   LUMBAR LAMINECTOMY     SHOULDER ARTHROSCOPY WITH ROTATOR CUFF REPAIR AND SUBACROMIAL DECOMPRESSION Right 11/23/2018   Procedure: SHOULDER ARTHROSCOPY WITH ROTATOR CUFF REPAIR AND SUBACROMIAL DECOMPRESSION;  Surgeon: [name], MD;  Location: [place];  Service: Orthopedics;  Laterality: Right;   TEE WITHOUT CARDIOVERSION N/A 12/24/2021   Procedure: TRANSESOPHAGEAL ECHOCARDIOGRAM (TEE);  Surgeon: Lajuana Matte, MD;  Location: [place];  Service: Open Heart Surgery;  Laterality: N/A;   Family History  Problem Relation Age of Onset   COPD Mother    Stroke Father    Ovarian cancer Sister    Stomach cancer Sister    Heart disease Sister        MI   Heart disease Sister        MI   Stomach cancer Maternal Grandmother    Alcohol abuse Other    Depression Other    Arthritis Other    Hypertension Other     Coronary artery disease Other    Ovarian cancer Other        neice   Ovarian cancer Other        neice   Colon polyps Neg Hx    Colon cancer Neg Hx    Social History   Socioeconomic History   Marital status: Married    Spouse name: Lorriane Shire   Number of children: 2   Years of education: Not on file   Highest education level: Not on file  Occupational History   Occupation: self employed    Fish farm manager: UNEMPLOYED  Tobacco Use   Smoking status: Former    Years: 16.00    Types: Cigarettes    Quit date: 12/29/1985    Years since quitting: 36.1   Smokeless tobacco: Never  Vaping Use   Vaping Use: Never used  Substance and Sexual Activity   Alcohol use: Not Currently   Drug use: No   Sexual activity: Yes    Partners: Female  Other Topics Concern   Not on file  Social History Narrative   Exercise-- 3 days   Pt has hs degree   Right handed   One story home   Drinks caffeine   Social Determinants of Health   Financial Resource Strain: Low Risk    Difficulty of Paying Living Expenses: Not hard at all  Food Insecurity: No Food Insecurity   Worried About Charity fundraiser in the Last Year: Never true   [place] in the Last Year: Never true  Transportation Needs: No Transportation Needs   Lack of Transportation (Medical): No   Lack of Transportation (Non-Medical): No  Physical Activity: Sufficiently Active   Days of Exercise per Week: 7 days   Minutes of Exercise per Session: 30 min  Stress: No Stress Concern Present   Feeling of Stress : Only a little  Social Connections: Moderately Integrated   Frequency of Communication with Friends and Family: Three times a week   Frequency of Social Gatherings with Friends and Family: More than three times a week   Attends Religious Services: More than 4 times per year   Active Member of [employer] or Organizations: No   Attends Archivist Meetings: Never   Marital Status: Married    Tobacco Counseling Counseling  given: Not Answered  Clinical Intake:  Pre-visit preparation completed: Yes  Pain : 0-10 Pain Score: 2  Pain Type: Chronic pain Pain Location: Chest (due to recent procedures) Pain Orientation: Left Pain Onset: More than a month ago Pain Frequency: Constant     BMI - recorded: 25.52 Nutritional Status: BMI 25 -29 Overweight Nutritional Risks: Unintentional weight loss Diabetes: Yes CBG done?: No Did pt. bring in CBG monitor from home?: No  How often do you need to have someone help you when you read instructions, pamphlets, or other written materials from your doctor or pharmacy?: 1 - Never  Diabetes:  Is the patient diabetic?  Yes  If diabetic, was a CBG obtained today?  No  Did the patient bring in their glucometer from home?  No phone visit How often do you monitor your CBG's? daily.   Financial Strains and Diabetes Management:  Are you having any financial strains with the device, your supplies or your medication? No .  Does the patient want to be seen by Chronic Care Management for management of their diabetes?  No  Would the patient like to be referred to a Nutritionist or for Diabetic Management?  No   Diabetic Exams:  Diabetic Eye Exam: . Overdue for diabetic eye exam. Pt has been advised about the importance in completing this exam.   Diabetic Foot Exam: Pt has been advised about the importance in completing this exam. To be completed by PCP.   Interpreter Needed?: No  Information entered by :: Caroleen Hamman LPN   Activities of Daily Living In your present state of health, do you have any difficulty performing the following activities: 02/04/2022 01/24/2022  Hearing? N Y  Vision? N N  Difficulty concentrating or making decisions? N Y  Walking or climbing stairs? N N  Dressing or bathing? N -  Doing errands, shopping? N N  Preparing Food and eating ? N -  Using the Toilet? N -  In the past six months, have you accidently leaked urine? N -  Do you  have problems with loss of bowel control? Y -  Managing your Medications? N -  Managing your Finances? N -  Housekeeping or managing your Housekeeping? N -  Some recent data might be hidden    Patient Care Team: [name], Alferd Apa, DO as PCP - General Berniece Salines, DO as PCP - Cardiology (Cardiology) Karl Luke, MD as Referring Physician (Optometry) Shamleffer, Melanie Crazier, MD as Consulting Physician (Endocrinology)  Indicate any recent Medical Services you may have received from other than [facility] providers in the past year (date may be approximate).     Assessment:   This is a routine wellness examination for Ronald Petersen.  Hearing/Vision screen Hearing Screening - Comments:: No issues Vision Screening - Comments:: Last eye exam-12/2020  Dietary issues and exercise activities discussed: Current Exercise Habits: Home exercise routine, Type of exercise: walking, Time (Minutes): 30, Frequency (Times/Week): 7, Weekly Exercise (Minutes/Week): 210, Intensity: Mild, Exercise limited by: None identified   Goals Addressed             This Visit's Progress    Patient Stated       Drink more water, eat healthier & increase activity       Depression Screen PHQ 2/9 Scores 02/04/2022 06/07/2021 09/06/2020 08/03/2018  PHQ - 2 Score 0 0 0 0  PHQ- 9 Score - 0 - -    Fall Risk Fall Risk  02/04/2022 02/26/2021 09/06/2020 04/02/2016 01/23/2016  Falls in the past year?  0 1 0 No No  Number falls in past yr: 0 1 0 - -  Injury with Fall? 0 0 0 - -  Follow up Falls prevention discussed - Falls evaluation completed - -    FALL RISK PREVENTION PERTAINING TO THE HOME:  Any stairs in or around the home? No  Home free of loose throw rugs in walkways, pet beds, electrical cords, etc? Yes  Adequate lighting in your home to reduce risk of falls? Yes   ASSISTIVE DEVICES UTILIZED TO PREVENT FALLS:  Life alert? No  Use of a cane, walker or w/c? No  Grab bars in the bathroom? No  Shower chair or bench  in shower? Yes  Elevated toilet seat or a handicapped toilet? No   TIMED UP AND GO:  Was the test performed? No . Phone visit   Cognitive Function: MMSE - Mini Mental State Exam 05/03/2021 12/25/2015  Orientation to time 5 5  Orientation to Place 5 5  Registration 3 3  Attention/ Calculation 4 4  Recall 1 1  Language- name 2 objects 2 2  Language- repeat 1 1  Language- follow 3 step command 2 3  Language- read & follow direction 1 1  Write a sentence 1 1  Copy design 1 1  Total score 26 27        Immunizations Immunization History  Administered Date(s) Administered   Influenza,inj,Quad PF,6+ Mos 02/26/2016   Td 01/05/2002   Tdap 07/31/2014    TDAP status: Up to date  Flu Vaccine status: Declined, Education has been provided regarding the importance of this vaccine but patient still declined. Advised may receive this vaccine at local pharmacy or Health Dept. Aware to provide a copy of the vaccination record if obtained from local pharmacy or Health Dept. Verbalized acceptance and understanding.  Pneumococcal vaccine status: Due, Education has been provided regarding the importance of this vaccine. Advised may receive this vaccine at local pharmacy or Health Dept. Aware to provide a copy of the vaccination record if obtained from local pharmacy or Health Dept. Verbalized acceptance and understanding.  Covid-19 vaccine status: Declined, Education has been provided regarding the importance of this vaccine but patient still declined. Advised may receive this vaccine at local pharmacy or Health Dept.or vaccine clinic. Aware to provide a copy of the vaccination record if obtained from local pharmacy or Health Dept. Verbalized acceptance and understanding.  Qualifies for Shingles Vaccine? Yes   Zostavax completed No   Shingrix Completed?: No.    Education has been provided regarding the importance of this vaccine. Patient has been advised to call insurance company to determine out  of pocket expense if they have not yet received this vaccine. Advised may also receive vaccine at local pharmacy or Health Dept. Verbalized acceptance and understanding.  Screening Tests Health Maintenance  Topic Date Due   COVID-19 Vaccine (1) Never done   Pneumonia Vaccine 60+ Years old (1 - PCV) Never done   Zoster Vaccines- Shingrix (1 of 2) Never done   OPHTHALMOLOGY EXAM  02/18/2018   FOOT EXAM  05/31/2021   URINE MICROALBUMIN  05/31/2021   INFLUENZA VACCINE  07/29/2021   HEMOGLOBIN A1C  06/23/2022   TETANUS/TDAP  07/31/2024   COLONOSCOPY (Pts 45-79yrs Insurance coverage will need to be confirmed)  07/19/2031   Hepatitis C Screening  Completed   HPV VACCINES  Aged Out    Health Maintenance  Health Maintenance Due  Topic Date Due   COVID-19 Vaccine (1) Never done  Pneumonia Vaccine 1+ Years old (1 - PCV) Never done   Zoster Vaccines- Shingrix (1 of 2) Never done   OPHTHALMOLOGY EXAM  02/18/2018   FOOT EXAM  05/31/2021   URINE MICROALBUMIN  05/31/2021   INFLUENZA VACCINE  07/29/2021    Colorectal cancer screening: Type of screening: Colonoscopy. Completed 07/18/2021. Repeat every 10 years  Lung Cancer Screening: (Low Dose CT Chest recommended if Age 79-80 years, 30 pack-year currently smoking OR have quit w/in 15years.) does not qualify.    Additional Screening:  Hepatitis C Screening:  Completed 11/06/2015  Vision Screening: Recommended annual ophthalmology exams for early detection of glaucoma and other disorders of the eye. Is the patient up to date with their annual eye exam?  No  Who is the provider or what is the name of the office in which the patient attends annual eye exams? Dr. Phineas Real  Dental Screening: Recommended annual dental exams for proper oral hygiene  Community Resource Referral / Chronic Care Management: CRR required this visit?  No   CCM required this visit?  No      Plan:     I have personally reviewed and noted the following in the  patients chart:   Medical and social history Use of alcohol, tobacco or illicit drugs  Current medications and supplements including opioid prescriptions. Patient is currently taking opioid prescriptions. Information provided to patient regarding non-opioid alternatives. Patient advised to discuss non-opioid treatment plan with their provider. Functional ability and status Nutritional status Physical activity Advanced directives List of other physicians Hospitalizations, surgeries, and ER visits in previous 12 months Vitals Screenings to include cognitive, depression, and falls Referrals and appointments  In addition, I have reviewed and discussed with patient certain preventive protocols, quality metrics, and best practice recommendations. A written personalized care plan for preventive services as well as general preventive health recommendations were provided to patient.   Due to this being a telephonic visit, the after visit summary with patients personalized plan was offered to patient via mail or my-chart.  Patient would like to access on my-chart.   Marta Antu, LPN   04/30/7481  Nurse Health Advisor  Nurse Notes: None

## 2022-02-05 ENCOUNTER — Ambulatory Visit (HOSPITAL_BASED_OUTPATIENT_CLINIC_OR_DEPARTMENT_OTHER)
Admission: RE | Admit: 2022-02-05 | Discharge: 2022-02-05 | Disposition: A | Discharge status: Home / Self Care | Payer: Medicare HMO | Source: Ambulatory Visit | Attending: Medical | Admitting: Medical

## 2022-02-05 ENCOUNTER — Other Ambulatory Visit: Payer: Self-pay | Admitting: Thoracic Surgery (Cardiothoracic Vascular Surgery)

## 2022-02-05 ENCOUNTER — Other Ambulatory Visit: Payer: Self-pay

## 2022-02-05 ENCOUNTER — Ambulatory Visit (INDEPENDENT_AMBULATORY_CARE_PROVIDER_SITE_OTHER): Payer: Medicare HMO | Admitting: Medical

## 2022-02-05 ENCOUNTER — Other Ambulatory Visit (HOSPITAL_BASED_OUTPATIENT_CLINIC_OR_DEPARTMENT_OTHER): Payer: Self-pay

## 2022-02-05 VITALS — BP 130/75 | HR 67 | Temp 98.2°F | Resp 18 | Ht 71.0 in | Wt 180.8 lb

## 2022-02-05 DIAGNOSIS — R1013 Epigastric pain: Secondary | ICD-10-CM | POA: Diagnosis not present

## 2022-02-05 DIAGNOSIS — J90 Pleural effusion, not elsewhere classified: Secondary | ICD-10-CM

## 2022-02-05 DIAGNOSIS — R195 Other fecal abnormalities: Secondary | ICD-10-CM | POA: Diagnosis not present

## 2022-02-05 DIAGNOSIS — J9811 Atelectasis: Secondary | ICD-10-CM | POA: Diagnosis not present

## 2022-02-05 DIAGNOSIS — R079 Chest pain, unspecified: Secondary | ICD-10-CM | POA: Diagnosis not present

## 2022-02-05 DIAGNOSIS — R0600 Dyspnea, unspecified: Secondary | ICD-10-CM

## 2022-02-05 DIAGNOSIS — Z951 Presence of aortocoronary bypass graft: Secondary | ICD-10-CM

## 2022-02-05 DIAGNOSIS — R109 Unspecified abdominal pain: Secondary | ICD-10-CM | POA: Diagnosis not present

## 2022-02-05 LAB — COMPREHENSIVE METABOLIC PANEL
ALT: 11 U/L (ref 0–53)
AST: 17 U/L (ref 0–37)
Albumin: 3.8 g/dL (ref 3.5–5.2)
Alkaline Phosphatase: 73 U/L (ref 39–117)
BUN: 22 mg/dL (ref 6–23)
CO2: 31 mEq/L (ref 19–32)
Calcium: 9.7 mg/dL (ref 8.4–10.5)
Chloride: 102 mEq/L (ref 96–112)
Creatinine, Ser: 1.09 mg/dL (ref 0.40–1.50)
GFR: 70.67 mL/min (ref >60.00)
Glucose, Bld: 96 mg/dL (ref 70–99)
Potassium: 3.9 mEq/L (ref 3.5–5.1)
Sodium: 139 mEq/L (ref 135–145)
Total Bilirubin: 0.3 mg/dL (ref 0.2–1.2)
Total Protein: 7.3 g/dL (ref 6.0–8.3)

## 2022-02-05 LAB — CBC WITH DIFFERENTIAL/PLATELET
Basophils Absolute: 0.1 10*3/uL (ref 0.0–0.1)
Basophils Relative: 0.9 % (ref 0.0–3.0)
Eosinophils Absolute: 0.3 10*3/uL (ref 0.0–0.7)
Eosinophils Relative: 4.3 % (ref 0.0–5.0)
HCT: 43 % (ref 39.0–52.0)
Hemoglobin: 13.6 g/dL (ref 13.0–17.0)
Lymphocytes Relative: 23.3 % (ref 12.0–46.0)
Lymphs Abs: 1.4 10*3/uL (ref 0.7–4.0)
MCHC: 31.5 g/dL (ref 30.0–36.0)
MCV: 81.8 fl (ref 78.0–100.0)
Monocytes Absolute: 0.4 10*3/uL (ref 0.1–1.0)
Monocytes Relative: 7 % (ref 3.0–12.0)
Neutro Abs: 4 10*3/uL (ref 1.4–7.7)
Neutrophils Relative %: 64.5 % (ref 43.0–77.0)
Platelets: 261 10*3/uL (ref 150.0–400.0)
RBC: 5.26 Mil/uL (ref 4.22–5.81)
RDW: 14.6 % (ref 11.5–15.5)
WBC: 6.2 10*3/uL (ref 4.0–10.5)

## 2022-02-05 LAB — LIPASE: Lipase: 13 U/L (ref 11.0–59.0)

## 2022-02-05 MED ORDER — FAMOTIDINE 20 MG PO TABS
20.0000 mg | ORAL_TABLET | Freq: Every day | ORAL | 0 refills | Status: DC
  Filled 2022-02-05: qty 30, 30d supply, fill #0

## 2022-02-05 NOTE — Progress Notes (Signed)
Subjective:    Patient ID: Ronald Petersen, male    DOB: 1956-11-13, 66 y.o.   MRN: [id number]  HPI  Pt in for some loose stools that have occurred at night. Has happened 3-4 times. On time had loose stool at night. He used diaper last night and did not have loose stool.   Pt tells me it was occurring every night consecutively for numerous days in row over weekend. Some abdomen pain in rt side abdomen.  Also over the weekend was having some loose tools during the day.  Pt had his gallbaldder and appendix removed in past.    Mild sob at times. 02 sat 98%. No recurrent pedal edema recently. Pt had pleural effusion drained twice. Last Thursday was the last time removed.    Review of Systems  HENT:  Negative for congestion, drooling and ear discharge.   Respiratory:  Positive for shortness of breath. Negative for cough, choking and wheezing.        Mild.  Cardiovascular:  Negative for chest pain and palpitations.  Gastrointestinal:  Positive for abdominal pain and diarrhea. Negative for anal bleeding, constipation, nausea, rectal pain and vomiting.       Nausea last week none presently.  Musculoskeletal:  Negative for back pain and myalgias.  Neurological:  Negative for dizziness, seizures, syncope, weakness, numbness and headaches.  Hematological:  Negative for adenopathy. Does not bruise/bleed easily.  Psychiatric/Behavioral:  Negative for behavioral problems and decreased concentration.    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   Cancer [facility])    Complication of anesthesia    aspirated with back surgery at age 26   Depression    Diabetes mellitus Type II    GERD (gastroesophageal reflux disease)    Hyperlipidemia    Hypertension    Neuromuscular disorder (HCC)    NEUROPATHY   Right rotator cuff tear 11/23/2018   Sleep apnea    no CPAP     Social History   Socioeconomic History    Marital status: Married    Spouse name: Lorriane Shire   Number of children: 2   Years of education: Not on file   Highest education level: Not on file  Occupational History   Occupation: self employed    Fish farm manager: UNEMPLOYED  Tobacco Use   Smoking status: Former    Years: 16.00    Types: Cigarettes    Quit date: 12/29/1985    Years since quitting: 36.1   Smokeless tobacco: Never  Vaping Use   Vaping Use: Never used  Substance and Sexual Activity   Alcohol use: Not Currently   Drug use: No   Sexual activity: Yes    Partners: Female  Other Topics Concern   Not on file  Social History Narrative   Exercise-- 3 days   Pt has hs degree   Right handed   One story home   Drinks caffeine   Social Determinants of Health   Financial Resource Strain: Low Risk    Difficulty of Paying Living Expenses: Not hard at all  Food Insecurity: No Food Insecurity   Worried About Charity fundraiser in the Last Year: Never true   [place] in the Last Year: Never true  Transportation Needs: No Transportation Needs   Lack of Transportation (Medical): No   Lack of Transportation (Non-Medical): No  Physical Activity: Sufficiently Active   Days of Exercise per Week: 7 days   Minutes of Exercise per Session: 30 min  Stress: No Stress Concern Present   Feeling of Stress : Only a little  Social Connections: Moderately Integrated   Frequency of Communication with Friends and Family: Three times a week   Frequency of Social Gatherings with Friends and Family: More than three times a week   Attends Religious Services: More than 4 times per year   Active Member of Clubs or Organizations: No   Attends Archivist Meetings: Never   Marital Status: Married  Human resources officer Violence: Not At Risk   Fear of Current or Ex-Partner: No   Emotionally Abused: No   Physically Abused: No   Sexually Abused: No    Past Surgical History:  Procedure Laterality Date   APPENDECTOMY     ARTHOSCOPIC  ROTAOR CUFF REPAIR Right 11/23/2018   Procedure: ARTHROSCOPIC ROTATOR CUFF REPAIR;  Surgeon: [name], MD;  Location: [place];  Service: Orthopedics;  Laterality: Right;   CHOLECYSTECTOMY     CORONARY ARTERY BYPASS GRAFT N/A 12/24/2021   x2 LIMA to LAD; SVG to O[place]use [place]   CORONARY STENT INTERVENTION N/A 11/19/2020   Procedure: CORONARY STENT INTERVENTION;  Surgeon: Nelva Bush, MD;  Location: [place] [facility];  Service: Cardiovascular;  Laterality: N/A;   ENDOVEIN HARVEST OF GREATER SAPHENOUS VEIN Right 12/24/2021   Procedure: ENDOVEIN HARVEST OF GREATER SAPHENOUS VEIN;  Surgeon: Lajuana Matte, MD;  Location: [place];  Service: Open Heart Surgery;  Laterality: Right;   INTRAVASCULAR ULTRASOUND/IVUS N/A 11/19/2020   Procedure: Intravascular Ultrasound/IVUS;  Surgeon: Nelva Bush, MD;  Location: [place] [facility];  Service: Cardiovascular;  Laterality: N/A;   IR THORACENTESIS ASP PLEURAL SPACE W/IMG GUIDE  01/31/2022   LEFT HEART CATH AND CORONARY ANGIOGRAPHY N/A 11/19/2020   Procedure: LEFT HEART CATH AND CORONARY ANGIOGRAPHY;  Surgeon: Nelva Bush, MD;  Location: [place] [facility];  Service: Cardiovascular;  Laterality: N/A;   LEFT HEART CATH AND CORONARY ANGIOGRAPHY N/A 12/24/2020   Procedure: LEFT HEART CATH AND CORONARY ANGIOGRAPHY;  Surgeon: Martinique, Peter M, MD;  Location: [place] [facility];  Service: Cardiovascular;  Laterality: N/A;   LEFT HEART CATH AND CORONARY ANGIOGRAPHY N/A 12/16/2021   Procedure: LEFT HEART CATH AND CORONARY ANGIOGRAPHY;  Surgeon: Leonie Man, MD;  Location: [place] [facility];  Service: Cardiovascular;  Laterality: N/A;   LUMBAR LAMINECTOMY     SHOULDER ARTHROSCOPY WITH ROTATOR CUFF REPAIR AND SUBACROMIAL DECOMPRESSION Right 11/23/2018   Procedure: SHOULDER ARTHROSCOPY WITH ROTATOR CUFF REPAIR AND SUBACROMIAL DECOMPRESSION;  Surgeon: [name], MD;  Location: [place];  Service:  Orthopedics;  Laterality: Right;   TEE WITHOUT CARDIOVERSION N/A 12/24/2021   Procedure: TRANSESOPHAGEAL ECHOCARDIOGRAM (TEE);  Surgeon: Lajuana Matte, MD;  Location: [place];  Service: Open Heart Surgery;  Laterality: N/A;    Family History  Problem Relation Age of Onset   COPD Mother    Stroke Father    Ovarian cancer Sister    Stomach cancer Sister    Heart disease Sister        MI   Heart disease Sister        MI   Stomach cancer Maternal Grandmother    Alcohol abuse Other    Depression Other    Arthritis Other    Hypertension Other    Coronary artery disease Other    Ovarian cancer Other  neice   Ovarian cancer Other        neice   Colon polyps Neg Hx    Colon cancer Neg Hx     Allergies  Allergen Reactions   Niacin Anaphylaxis    Current Outpatient Medications on File Prior to Visit  Medication Sig Dispense Refill   acetaminophen (TYLENOL) 650 MG CR tablet Take 650 mg by mouth every 8 (eight) hours as needed for pain.     aspirin EC 325 MG EC tablet Take 1 tablet (325 mg total) by mouth daily.     Continuous Blood Gluc Receiver (FREESTYLE LIBRE 2 READER) DEVI Use as directed to check blood sugar 1 each 0   Continuous Blood Gluc Sensor (FREESTYLE LIBRE 2 SENSOR) MISC Use to check blood sugar, change every 14 days 2 each 6   dicyclomine (BENTYL) 10 MG capsule Take 1 capsule (10 mg total) by mouth every 8 (eight) hours as needed for spasms. 30 capsule 1   ELDERBERRY PO Take 300 mg by mouth daily.     empagliflozin (JARDIANCE) 10 MG TABS tablet Take 1 tablet (10 mg total) by mouth daily. 30 tablet 1   fenofibrate 160 MG tablet TAKE 1 TABLET (160 MG TOTAL) BY MOUTH DAILY. 90 tablet 1   ferrous PNTIRWER-X54-[id number] C-folic acid (TRINSICON / FOLTRIN) capsule Take 1 capsule by mouth 2 (two) times daily after a meal. 60 capsule 1   furosemide (LASIX) 40 MG tablet Take 1 tablet (40 mg total) by mouth daily. 90 tablet 1   gabapentin (NEURONTIN) 800 MG tablet Take 1  tablet (800 mg total) by mouth 3 (three) times daily. 270 tablet 2   glucose blood (ONETOUCH VERIO) test strip USE AS INSTRUCTED TO CHECK BLOOD SUGAR ONCE A DAY 100 strip 12   Magnesium Citrate 200 MG TABS Take 400 mg by mouth daily.     metoprolol succinate (TOPROL XL) 25 MG 24 hr tablet Take 1/2 tablet (12.5 mg total) by mouth daily. 45 tablet 1   ondansetron (ZOFRAN) 4 MG tablet Take 1 tablet (4 mg total) by mouth every 8 (eight) hours as needed. 20 tablet 2   oxyCODONE (OXY IR/ROXICODONE) 5 MG immediate release tablet Take 1 tablet (5 mg total) by mouth every 6 (six) hours as needed for up to 7 days for severe pain. 28 tablet 0   oxyCODONE (ROXICODONE) 5 MG immediate release tablet Take 1 tablet (5 mg total) by mouth every 4 (four) hours as needed for severe pain. Use only for breakthrough pain, if your regular Percocet order is not sufficient, and the Dilaudid is not sufficient. 30 tablet 0   pantoprazole (PROTONIX) 40 MG tablet TAKE 1 TABLET (40 MG TOTAL) BY  MOUTH ONCE A DAY 60 tablet 3   polyethylene glycol powder (GLYCOLAX/MIRALAX) 17 GM/SCOOP powder Take 17 g by mouth 2 (two) times daily as needed for moderate constipation.     potassium chloride SA (KLOR-CON M) 20 MEQ tablet Take 1 tablet by mouth daily for three days then resume taking 1 tablet every other day 7 tablet 0   rosuvastatin (CRESTOR) 40 MG tablet TAKE 1 TABLET BY MOUTH ONCE DAILY 90 tablet 3   sertraline (ZOLOFT) 100 MG tablet TAKE ONE AND ONE-HALF TABLET BY MOUTH ONCE DAILY (Patient taking differently: Take 150 mg by mouth daily.) 135 tablet 3   tiZANidine (ZANAFLEX) 4 MG tablet TAKE 1 TABLET (4 MG TOTAL) BY MOUTH EVERY 6 (SIX) HOURS AS NEEDED FOR MUSCLE SPASMS. 30 tablet 1  traZODone (DESYREL) 50 MG tablet Take 1 tablet (50 mg total) by mouth at bedtime as needed for sleep. 30 tablet 3   vitamin E 180 MG (400 UNITS) capsule Take 1,600 Units by mouth daily.     No current facility-administered medications on file prior to  visit.    BP 130/75    Pulse 67    Temp 98.2 F (36.8 C)    Resp 18    Ht 5\' 11"  (1.803 m)    Wt 180 lb 12.8 oz (82 kg)    SpO2 98%    BMI 25.22 kg/m       Objective:   Physical Exam  General Mental Status- Alert. General Appearance- Not in acute distress.   Skin General: Color- Normal Color. Moisture- Normal Moisture.  Neck Carotid Arteries- Normal color. Moisture- Normal Moisture. No carotid bruits. No JVD.  Chest and Lung Exam Auscultation: Breath Sounds:-Normal.  Cardiovascular Auscultation:Rythm- Regular. Murmurs & Other Heart Sounds:Auscultation of the heart reveals- No Murmurs.  Abdomen Inspection:-Inspeection Normal. Palpation/Percussion:Note:No mass. Palpation and Percussion of the abdomen reveal- mild epigastric Tender, Non Distended + BS, no rebound or guarding.   Neurologic Cranial Nerve exam:- CN III-XII intact(No nystagmus), symmetric smile. Strength:- 5/5 equal and symmetric strength both upper and lower extremities.       Assessment & Plan:   Patient Instructions  Recent epigastric area pain. Pain on palpation on exam. Will prescribe famotadine while we await on work up results.   Continue protonix and healthy diet.  Will get cbc, cmp and lipase stat. Also 1 view abd xray to assess stool burden.  Get stool culture, stool o and p and c dificile study.(Turn in study tomorrow)  For mild dyspnea and recent pleural effusion will get cxr.  If work up negative and symptoms and gi symptoms persist consider ct abd/pelvis.  Follow up in 7 days or sooner if needed    [employer], PA-C

## 2022-02-05 NOTE — Addendum Note (Signed)
Addended by: Kelle Darting A on: 02/05/2022 03:38 PM   Modules accepted: Orders

## 2022-02-05 NOTE — Patient Instructions (Addendum)
Recent epigastric area pain. Pain on palpation on exam. Will prescribe famotadine while we await on work up results.   Continue protonix and healthy diet.  Will get cbc, cmp and lipase stat. Also 1 view abd xray to assess stool burden.  Get stool culture, stool o and p and c dificile study.(Turn in study tomorrow)  For mild dyspnea and recent pleural effusion will get cxr.  If work up negative and symptoms and gi symptoms persist consider ct abd/pelvis.  Follow up in 7 days or sooner if needed

## 2022-02-07 ENCOUNTER — Other Ambulatory Visit: Payer: Self-pay

## 2022-02-07 ENCOUNTER — Encounter: Payer: Self-pay | Admitting: Medical

## 2022-02-07 ENCOUNTER — Ambulatory Visit (INDEPENDENT_AMBULATORY_CARE_PROVIDER_SITE_OTHER): Payer: Self-pay | Admitting: Thoracic Surgery (Cardiothoracic Vascular Surgery)

## 2022-02-07 ENCOUNTER — Ambulatory Visit
Admission: RE | Admit: 2022-02-07 | Discharge: 2022-02-07 | Disposition: A | Discharge status: Home / Self Care | Payer: Medicare HMO | Source: Ambulatory Visit | Attending: Thoracic Surgery (Cardiothoracic Vascular Surgery) | Admitting: Thoracic Surgery (Cardiothoracic Vascular Surgery)

## 2022-02-07 VITALS — BP 113/74 | HR 64 | Resp 20 | Ht 71.0 in | Wt 183.0 lb

## 2022-02-07 DIAGNOSIS — I2510 Atherosclerotic heart disease of native coronary artery without angina pectoris: Secondary | ICD-10-CM

## 2022-02-07 DIAGNOSIS — J9811 Atelectasis: Secondary | ICD-10-CM | POA: Diagnosis not present

## 2022-02-07 DIAGNOSIS — Z951 Presence of aortocoronary bypass graft: Secondary | ICD-10-CM

## 2022-02-07 DIAGNOSIS — J90 Pleural effusion, not elsewhere classified: Secondary | ICD-10-CM

## 2022-02-09 NOTE — Addendum Note (Signed)
Addended by: Anabel Halon on: 02/09/2022 11:55 AM   Modules accepted: Orders

## 2022-02-10 ENCOUNTER — Other Ambulatory Visit (HOSPITAL_BASED_OUTPATIENT_CLINIC_OR_DEPARTMENT_OTHER): Payer: Self-pay

## 2022-02-10 ENCOUNTER — Other Ambulatory Visit: Payer: Self-pay | Admitting: Family Medicine

## 2022-02-10 DIAGNOSIS — R110 Nausea: Secondary | ICD-10-CM

## 2022-02-10 MED ORDER — PANTOPRAZOLE SODIUM 40 MG PO TBEC
DELAYED_RELEASE_TABLET | ORAL | 3 refills | Status: DC
  Filled 2022-02-10: qty 60, 30d supply, fill #0
  Filled 2022-03-15: qty 60, 30d supply, fill #1
  Filled 2022-04-24: qty 60, 30d supply, fill #2
  Filled 2022-05-26: qty 60, 30d supply, fill #3

## 2022-02-10 MED ORDER — OXYCODONE HCL 5 MG PO TABS
5.0000 mg | ORAL_TABLET | ORAL | 0 refills | Status: DC | PRN
Start: 2022-02-10 — End: 2022-02-21
  Filled 2022-02-10: qty 30, 5d supply, fill #0

## 2022-02-10 MED ORDER — OXYCODONE HCL 5 MG PO TABS: 5.0000 mg | ORAL_TABLET | ORAL | 0 refills | Status: DC | PRN

## 2022-02-10 NOTE — Telephone Encounter (Signed)
Requesting: oxycodone 5mg   Contract:  03/07/2021 UDS: 03/07/2021 Last Visit: 01/31/2022 Next Visit: 06/03/2022 Last Refill: 01/31/2022 #30 and 0RF  Please Advise

## 2022-02-10 NOTE — Progress Notes (Signed)
° °   °  [address]       ,[place] [address]             [phone]        Ronald Petersen [place] Medical Record #[id number] Date of Birth: [birth year]  Referring: Leonie Man, MD Primary Care: [name], Alferd Apa, DO Primary Cardiologist:Kardie Tobb, DO  Reason for visit:   follow-up  History of Present Illness:     Ms. Ronald Petersen comes in for 1 month follow-up appointment.  He continues to complain of some shortness of breath.  He has had a thoracentesis on 2 occasions in the past.  Physical Exam: BP 113/74    Pulse 64    Resp 20    Ht 5\' 11"  (1.803 m)    Wt 183 lb (83 kg)    SpO2 99% Comment: RA   BMI 25.52 kg/m   Alert NAD Incision clean.  Sternum stable Abdomen soft, ND No peripheral edema   Diagnostic Studies & Laboratory data: CXR: Stable small left effusion.     Assessment / Plan:   66 year old male status post CABG.  Has had a recurrent left-sided effusion.  He is undergone a thoracentesis with interventional radiology.  I will refer him to pulmonary medicine for another thoracentesis.  The effusion appears small however the patient is symptomatic and thus I think another ultrasound and potential thoracentesis may be beneficial for him.  He is cleared for cardiac rehab.  He will follow-up in a month.   Lajuana Matte 02/10/2022 9:31 AM

## 2022-02-12 ENCOUNTER — Other Ambulatory Visit: Payer: Self-pay | Admitting: Family Medicine

## 2022-02-13 DIAGNOSIS — G4733 Obstructive sleep apnea (adult) (pediatric): Secondary | ICD-10-CM | POA: Diagnosis not present

## 2022-02-14 ENCOUNTER — Other Ambulatory Visit (HOSPITAL_COMMUNITY)
Admit: 2022-02-14 | Discharge: 2022-02-14 | Disposition: A | Discharge status: Home / Self Care | Payer: Medicare HMO | Source: Ambulatory Visit | Attending: Pulmonary Disease | Admitting: Pulmonary Disease

## 2022-02-14 ENCOUNTER — Other Ambulatory Visit: Payer: Self-pay

## 2022-02-14 ENCOUNTER — Other Ambulatory Visit (HOSPITAL_COMMUNITY)
Admission: RE | Admit: 2022-02-14 | Discharge: 2022-02-14 | Disposition: A | Discharge status: Home / Self Care | Payer: Medicare HMO | Source: Ambulatory Visit | Attending: Pulmonary Disease | Admitting: Pulmonary Disease

## 2022-02-14 ENCOUNTER — Encounter: Payer: Self-pay | Admitting: Pulmonary Disease

## 2022-02-14 ENCOUNTER — Ambulatory Visit (INDEPENDENT_AMBULATORY_CARE_PROVIDER_SITE_OTHER): Payer: Medicare HMO | Admitting: Pulmonary Disease

## 2022-02-14 ENCOUNTER — Ambulatory Visit (INDEPENDENT_AMBULATORY_CARE_PROVIDER_SITE_OTHER): Payer: Medicare HMO

## 2022-02-14 VITALS — BP 108/62 | HR 68 | Temp 98.0°F | Ht 71.0 in | Wt 190.0 lb

## 2022-02-14 DIAGNOSIS — J90 Pleural effusion, not elsewhere classified: Secondary | ICD-10-CM

## 2022-02-14 DIAGNOSIS — Z951 Presence of aortocoronary bypass graft: Secondary | ICD-10-CM | POA: Insufficient documentation

## 2022-02-14 DIAGNOSIS — Z9889 Other specified postprocedural states: Secondary | ICD-10-CM | POA: Diagnosis not present

## 2022-02-14 LAB — PROTEIN, PLEURAL OR PERITONEAL FLUID: Total protein, fluid: 4.6 g/dL

## 2022-02-14 LAB — BODY FLUID CELL COUNT WITH DIFFERENTIAL
Eos, Fluid: 2 %
Lymphs, Fluid: 89 %
Monocyte-Macrophage-Serous Fluid: 5 % — ABNORMAL LOW (ref 50–90)
Neutrophil Count, Fluid: 4 % (ref 0–25)
Total Nucleated Cell Count, Fluid: 1522 cu mm — ABNORMAL HIGH (ref 0–1000)

## 2022-02-14 LAB — LACTATE DEHYDROGENASE

## 2022-02-14 LAB — TIQ-NTM

## 2022-02-14 LAB — GLUCOSE, PLEURAL OR PERITONEAL FLUID: Glucose, Fluid: 142 mg/dL

## 2022-02-14 LAB — ALBUMIN, PLEURAL OR PERITONEAL FLUID: Albumin, Fluid: 2.8 g/dL

## 2022-02-14 NOTE — Progress Notes (Signed)
Thoracentesis  Procedure Note  Ronald Petersen  [id number]  October 27, 1956  Date:02/14/22  Time:9:59 AM   Provider Performing:Laurabeth Yip L Azlynn Mitnick   Procedure: Thoracentesis with imaging guidance (99833)  Indication(s) Pleural Effusion  Consent Risks of the procedure as well as the alternatives and risks of each were explained to the patient and/or caregiver.  Consent for the procedure was obtained and is signed in the bedside chart  Anesthesia Topical only with 1% lidocaine    Time Out Verified patient identification, verified procedure, site/side was marked, verified correct patient position, special equipment/implants available, medications/allergies/relevant history reviewed, required imaging and test results available.   Sterile Technique Maximal sterile technique including full sterile barrier drape, hand hygiene, sterile gown, sterile gloves, mask, hair covering, sterile ultrasound probe cover (if used).  Procedure Description Ultrasound was used to identify appropriate pleural anatomy for placement and overlying skin marked.  Area of drainage cleaned and draped in sterile fashion. Lidocaine was used to anesthetize the skin and subcutaneous tissue.  500 cc's of amber appearing fluid was drained from the left pleural space. Catheter then removed and bandaid applied to site.   Complications/Tolerance None; patient tolerated the procedure well. Chest X-ray is ordered to confirm no post-procedural complication.   EBL Minimal   Specimen(s) Pleural fluid  Ronald Nash, DO [facility] 02/14/2022 10:01 AM       [place] image of left pleural effusion

## 2022-02-14 NOTE — Patient Instructions (Signed)
Thank you for visiting Dr. Valeta Harms at [facility]. Today we recommend the following:  Orders Placed This Encounter  Procedures   DG Chest 2 View   We are happy to drain effusion if needed.   Return if symptoms worsen or fail to improve, for f/u with Dr. Kipp Brood.    Please do your part to reduce the spread of COVID-19.

## 2022-02-14 NOTE — Addendum Note (Signed)
Addended by: Fran Lowes on: 02/14/2022 10:29 AM   Modules accepted: Orders

## 2022-02-14 NOTE — Progress Notes (Signed)
Synopsis: Referred in February 2023 for pleural effusion, Dr. Kipp Brood, PCP: By [name], Alferd Apa, *  Subjective:   PATIENT ID: Ronald Petersen GENDER: male DOB: [birth year], MRN: [id number]  Chief Complaint  Patient presents with   Consult    Consult. Patient says he feels like he is gaining pressure in his lungs.     This is a 66 year old gentleman, history of coronary artery disease status post CABG in December 2022, history of hypertension, hyperlipidemia, OSA not on CPAP.Patient was recently seen in the office by Dr. Kipp Brood on 02/07/2022.  He has a recurrent left-sided effusion has undergone thoracentesis a few times.  Plan to follow-up with them in a month.  He has already had 2 thoracentesis in the past.  Patient is not on any blood thinners or antiplatelets except for daily aspirin.  It does appear however after subsequent thoracenteses the accumulation of fluid gets smaller.  Reviewing images from January until now. From a respiratory standpoint he continues to get short of breath.    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   Cancer [facility])    Complication of anesthesia    aspirated with back surgery at age 24   Depression    Diabetes mellitus Type II    GERD (gastroesophageal reflux disease)    Hyperlipidemia    Hypertension    Neuromuscular disorder ([place])    NEUROPATHY   Right rotator cuff tear 11/23/2018   Sleep apnea    no CPAP     Family History  Problem Relation Age of Onset   COPD Mother    Stroke Father    Ovarian cancer Sister    Stomach cancer Sister    Heart disease Sister        MI   Heart disease Sister        MI   Stomach cancer Maternal Grandmother    Alcohol abuse Other    Depression Other    Arthritis Other    Hypertension Other    Coronary artery disease Other    Ovarian cancer Other        neice   Ovarian cancer Other        neice   Colon polyps  Neg Hx    Colon cancer Neg Hx      Past Surgical History:  Procedure Laterality Date   APPENDECTOMY     ARTHOSCOPIC ROTAOR CUFF REPAIR Right 11/23/2018   Procedure: ARTHROSCOPIC ROTATOR CUFF REPAIR;  Surgeon: [name], MD;  Location: [place];  Service: Orthopedics;  Laterality: Right;   CHOLECYSTECTOMY     CORONARY ARTERY BYPASS GRAFT N/A 12/24/2021   x2 LIMA to LAD; SVG to Obt[place] Marginal   CORONARY STENT INTERVENTION N/A 11/19/2020   Procedure: CORONARY STENT INTERVENTION;  Surgeon: Nelva Bush, MD;  Location: [place] [facility];  Service: Cardiovascular;  Laterality: N/A;   ENDOVEIN HARVEST OF GREATER SAPHENOUS VEIN Right 12/24/2021   Procedure: ENDOVEIN HARVEST OF GREATER SAPHENOUS VEIN;  Surgeon: Lajuana Matte, MD;  Location: [place];  Service: Open Heart Surgery;  Laterality: Right;   INTRAVASCULAR ULTRASOUND/IVUS N/A 11/19/2020   Procedure: Intravascular Ultrasound/IVUS;  Surgeon: Nelva Bush, MD;  Location: [place] [facility];  Service: Cardiovascular;  Laterality: N/A;   IR THORACENTESIS ASP PLEURAL SPACE W/IMG GUIDE  01/31/2022   LEFT HEART CATH AND CORONARY  ANGIOGRAPHY N/A 11/19/2020   Procedure: LEFT HEART CATH AND CORONARY ANGIOGRAPHY;  Surgeon: Nelva Bush, MD;  Location: [place] [facility];  Service: Cardiovascular;  Laterality: N/A;   LEFT HEART CATH AND CORONARY ANGIOGRAPHY N/A 12/24/2020   Procedure: LEFT HEART CATH AND CORONARY ANGIOGRAPHY;  Surgeon: Martinique, Peter M, MD;  Location: [place] [facility];  Service: Cardiovascular;  Laterality: N/A;   LEFT HEART CATH AND CORONARY ANGIOGRAPHY N/A 12/16/2021   Procedure: LEFT HEART CATH AND CORONARY ANGIOGRAPHY;  Surgeon: Leonie Man, MD;  Location: [place] [facility];  Service: Cardiovascular;  Laterality: N/A;   LUMBAR LAMINECTOMY     SHOULDER ARTHROSCOPY WITH ROTATOR CUFF REPAIR AND SUBACROMIAL DECOMPRESSION Right 11/23/2018   Procedure: SHOULDER ARTHROSCOPY WITH ROTATOR CUFF  REPAIR AND SUBACROMIAL DECOMPRESSION;  Surgeon: [name], MD;  Location: [place];  Service: Orthopedics;  Laterality: Right;   TEE WITHOUT CARDIOVERSION N/A 12/24/2021   Procedure: TRANSESOPHAGEAL ECHOCARDIOGRAM (TEE);  Surgeon: Lajuana Matte, MD;  Location: [place];  Service: Open Heart Surgery;  Laterality: N/A;    Social History   Socioeconomic History   Marital status: Married    Spouse name: Lorriane Shire   Number of children: 2   Years of education: Not on file   Highest education level: Not on file  Occupational History   Occupation: self employed    Fish farm manager: UNEMPLOYED  Tobacco Use   Smoking status: Former    Years: 16.00    Types: Cigarettes    Quit date: 12/29/1985    Years since quitting: 36.1   Smokeless tobacco: Never  Vaping Use   Vaping Use: Never used  Substance and Sexual Activity   Alcohol use: Not Currently   Drug use: No   Sexual activity: Yes    Partners: Female  Other Topics Concern   Not on file  Social History Narrative   Exercise-- 3 days   Pt has hs degree   Right handed   One story home   Drinks caffeine   Social Determinants of Health   Financial Resource Strain: Low Risk    Difficulty of Paying Living Expenses: Not hard at all  Food Insecurity: No Food Insecurity   Worried About Charity fundraiser in the Last Year: Never true   [place] in the Last Year: Never true  Transportation Needs: No Transportation Needs   Lack of Transportation (Medical): No   Lack of Transportation (Non-Medical): No  Physical Activity: Sufficiently Active   Days of Exercise per Week: 7 days   Minutes of Exercise per Session: 30 min  Stress: No Stress Concern Present   Feeling of Stress : Only a little  Social Connections: Moderately Integrated   Frequency of Communication with Friends and Family: Three times a week   Frequency of Social Gatherings with Friends and Family: More than three times a week   Attends Religious  Services: More than 4 times per year   Active Member of [employer] or Organizations: No   Attends Archivist Meetings: Never   Marital Status: Married  Human resources officer Violence: Not At Risk   Fear of Current or Ex-Partner: No   Emotionally Abused: No   Physically Abused: No   Sexually Abused: No     Allergies  Allergen Reactions   Niacin Anaphylaxis     Outpatient Medications Prior to Visit  Medication Sig Dispense Refill   acetaminophen (TYLENOL) 650 MG CR tablet Take 650 mg by mouth every 8 (  eight) hours as needed for pain.     aspirin EC 325 MG EC tablet Take 1 tablet (325 mg total) by mouth daily.     Continuous Blood Gluc Receiver (FREESTYLE LIBRE 2 READER) DEVI Use as directed to check blood sugar 1 each 0   Continuous Blood Gluc Sensor (FREESTYLE LIBRE 2 SENSOR) MISC Use to check blood sugar, change every 14 days 2 each 6   dicyclomine (BENTYL) 10 MG capsule Take 1 capsule (10 mg total) by mouth every 8 (eight) hours as needed for spasms. 30 capsule 1   ELDERBERRY PO Take 300 mg by mouth daily.     empagliflozin (JARDIANCE) 10 MG TABS tablet Take 1 tablet (10 mg total) by mouth daily. 30 tablet 1   famotidine (PEPCID) 20 MG tablet Take 1 tablet (20 mg total) by mouth daily. 30 tablet 0   fenofibrate 160 MG tablet TAKE 1 TABLET (160 MG TOTAL) BY MOUTH DAILY. 90 tablet 1   ferrous [id number] C-folic acid (TRINSICON / FOLTRIN) capsule Take 1 capsule by mouth 2 (two) times daily after a meal. 60 capsule 1   furosemide (LASIX) 40 MG tablet Take 1 tablet (40 mg total) by mouth daily. 90 tablet 1   gabapentin (NEURONTIN) 800 MG tablet Take 1 tablet (800 mg total) by mouth 3 (three) times daily. 270 tablet 2   glucose blood (ONETOUCH VERIO) test strip USE AS INSTRUCTED TO CHECK BLOOD SUGAR ONCE A DAY 100 strip 12   Magnesium Citrate 200 MG TABS Take 400 mg by mouth daily.     metoprolol succinate (TOPROL XL) 25 MG 24 hr tablet Take 1/2 tablet (12.5 mg total) by mouth  daily. 45 tablet 1   ondansetron (ZOFRAN) 4 MG tablet Take 1 tablet (4 mg total) by mouth every 8 (eight) hours as needed. 20 tablet 2   oxyCODONE (ROXICODONE) 5 MG immediate release tablet Take 1 tablet every 4 hours as needed for severe pain. Use only for breakthrough pain, if your regular Percocet order is not sufficient, and the Dilaudid is not sufficient. 30 tablet 0   pantoprazole (PROTONIX) 40 MG tablet TAKE 1 TABLET (40 MG TOTAL) BY MOUTH TWICE A DAY 60 tablet 3   polyethylene glycol powder (GLYCOLAX/MIRALAX) 17 GM/SCOOP powder Take 17 g by mouth 2 (two) times daily as needed for moderate constipation.     potassium chloride SA (KLOR-CON M) 20 MEQ tablet Take 1 tablet by mouth daily for three days then resume taking 1 tablet every other day 7 tablet 0   rosuvastatin (CRESTOR) 40 MG tablet TAKE 1 TABLET BY MOUTH ONCE DAILY 90 tablet 3   sertraline (ZOLOFT) 100 MG tablet TAKE ONE AND ONE-HALF TABLET BY MOUTH ONCE DAILY (Patient taking differently: Take 150 mg by mouth daily.) 135 tablet 3   tiZANidine (ZANAFLEX) 4 MG tablet TAKE 1 TABLET (4 MG TOTAL) BY MOUTH EVERY 6 (SIX) HOURS AS NEEDED FOR MUSCLE SPASMS. 30 tablet 1   traZODone (DESYREL) 50 MG tablet Take 1 tablet (50 mg total) by mouth at bedtime as needed for sleep. 30 tablet 3   vitamin E 180 MG (400 UNITS) capsule Take 1,600 Units by mouth daily.     No facility-administered medications prior to visit.    Review of Systems  Constitutional:  Negative for chills, fever, malaise/fatigue and weight loss.  HENT:  Negative for hearing loss, sore throat and tinnitus.   Eyes:  Negative for blurred vision and double vision.  Respiratory:  Positive for shortness  of breath. Negative for cough, hemoptysis, sputum production, wheezing and stridor.   Cardiovascular:  Negative for chest pain, palpitations, orthopnea, leg swelling and PND.  Gastrointestinal:  Negative for abdominal pain, constipation, diarrhea, heartburn, nausea and vomiting.   Genitourinary:  Negative for dysuria, hematuria and urgency.  Musculoskeletal:  Negative for joint pain and myalgias.  Skin:  Negative for itching and rash.  Neurological:  Negative for dizziness, tingling, weakness and headaches.  Endo/Heme/Allergies:  Negative for environmental allergies. Does not bruise/bleed easily.  Psychiatric/Behavioral:  Negative for depression. The patient is not nervous/anxious and does not have insomnia.   All other systems reviewed and are negative.   Objective:  Physical Exam Vitals reviewed.  Constitutional:      General: He is not in acute distress.    Appearance: He is well-developed.  HENT:     Head: Normocephalic and atraumatic.  Eyes:     General: No scleral icterus.    Conjunctiva/sclera: Conjunctivae normal.     Pupils: Pupils are equal, round, and reactive to light.  Neck:     Vascular: No JVD.     Trachea: No tracheal deviation.  Cardiovascular:     Rate and Rhythm: Normal rate and regular rhythm.     Heart sounds: Normal heart sounds. No murmur heard. Pulmonary:     Effort: Pulmonary effort is normal. No tachypnea, accessory muscle usage or respiratory distress.     Breath sounds: No stridor. No wheezing, rhonchi or rales.     Comments: Diminished breath sounds in the left base Abdominal:     General: Bowel sounds are normal. There is no distension.     Palpations: Abdomen is soft.     Tenderness: There is no abdominal tenderness.  Musculoskeletal:        General: No tenderness.     Cervical back: Neck supple.  Lymphadenopathy:     Cervical: No cervical adenopathy.  Skin:    General: Skin is warm and dry.     Capillary Refill: Capillary refill takes less than 2 seconds.     Findings: No rash.  Neurological:     Mental Status: He is alert and oriented to person, place, and time.  Psychiatric:        Behavior: Behavior normal.     Vitals:   02/14/22 0921  BP: 108/62  Pulse: 68  Temp: 98 F (36.7 C)  TempSrc: Oral   SpO2: 99%  Weight: 190 lb (86.2 kg)  Height: 5\' 11"  (1.803 m)   99% on RA BMI Readings from Last 3 Encounters:  02/14/22 26.50 kg/m  02/07/22 25.52 kg/m  02/05/22 25.22 kg/m   Wt Readings from Last 3 Encounters:  02/14/22 190 lb (86.2 kg)  02/07/22 183 lb (83 kg)  02/05/22 180 lb 12.8 oz (82 kg)     CBC    Component Value Date/Time   WBC 6.2 02/05/2022 1436   RBC 5.26 02/05/2022 1436   HGB 13.6 02/05/2022 1436   HGB 11.5 (L) 01/16/2022 0943   HCT 43.0 02/05/2022 1436   HCT 35.4 (L) 01/16/2022 0943   PLT 261.0 02/05/2022 1436   PLT 312 01/16/2022 0943   MCV 81.8 02/05/2022 1436   MCV 84 01/16/2022 0943   MCH 27.0 01/25/2022 0120   MCHC 31.5 02/05/2022 1436   RDW 14.6 02/05/2022 1436   RDW 12.8 01/16/2022 0943   LYMPHSABS 1.4 02/05/2022 1436   LYMPHSABS 1.5 12/09/2021 1014   MONOABS 0.4 02/05/2022 1436   EOSABS 0.3 02/05/2022 1436  EOSABS 0.1 12/09/2021 1014   BASOSABS 0.1 02/05/2022 1436   BASOSABS 0.0 12/09/2021 1014     Chest Imaging: Chest x-ray 02/07/2022: Left-sided pleural effusion. The patient's images have been independently reviewed by me.    Pulmonary Functions Testing Results: No flowsheet data found.  FeNO:   Pathology:   Echocardiogram:   Heart Catheterization:     Assessment & Plan:     ICD-10-CM   1. Pleural effusion  J90 DG Chest 2 View    2. S/P thoracentesis  Z98.890 DG Chest 2 View    3. S/P CABG x 2  Z95.1       Discussion: This is a 66 year old gentleman, history of recurrent left-sided pleural effusion status post 2 thoracentesis status post CABG x2.  Likely related to postop fluid collection.  Plan: Discussed risk benefits alternatives today in the office of undergoing thoracentesis. Patient is agreeable to this. Please see separate procedure note regarding thoracentesis Fluid studies sent today in the office. Hopefully the fluid collection will dry up and he will not need any more repeat thoracentesis on the  left side. It does appear that the effusion is continue to get smaller.  He has follow-up already scheduled with repeat chest x-ray with Dr. Kipp Brood.  I am happy to repeat his thoracentesis in the future if needed.   Current Outpatient Medications:    acetaminophen (TYLENOL) 650 MG CR tablet, Take 650 mg by mouth every 8 (eight) hours as needed for pain., Disp: , Rfl:    aspirin EC 325 MG EC tablet, Take 1 tablet (325 mg total) by mouth daily., Disp: , Rfl:    Continuous Blood Gluc Receiver (FREESTYLE LIBRE 2 READER) DEVI, Use as directed to check blood sugar, Disp: 1 each, Rfl: 0   Continuous Blood Gluc Sensor (FREESTYLE LIBRE 2 SENSOR) MISC, Use to check blood sugar, change every 14 days, Disp: 2 each, Rfl: 6   dicyclomine (BENTYL) 10 MG capsule, Take 1 capsule (10 mg total) by mouth every 8 (eight) hours as needed for spasms., Disp: 30 capsule, Rfl: 1   ELDERBERRY PO, Take 300 mg by mouth daily., Disp: , Rfl:    empagliflozin (JARDIANCE) 10 MG TABS tablet, Take 1 tablet (10 mg total) by mouth daily., Disp: 30 tablet, Rfl: 1   famotidine (PEPCID) 20 MG tablet, Take 1 tablet (20 mg total) by mouth daily., Disp: 30 tablet, Rfl: 0   fenofibrate 160 MG tablet, TAKE 1 TABLET (160 MG TOTAL) BY MOUTH DAILY., Disp: 90 tablet, Rfl: 1   ferrous [id number] C-folic acid (TRINSICON / FOLTRIN) capsule, Take 1 capsule by mouth 2 (two) times daily after a meal., Disp: 60 capsule, Rfl: 1   furosemide (LASIX) 40 MG tablet, Take 1 tablet (40 mg total) by mouth daily., Disp: 90 tablet, Rfl: 1   gabapentin (NEURONTIN) 800 MG tablet, Take 1 tablet (800 mg total) by mouth 3 (three) times daily., Disp: 270 tablet, Rfl: 2   glucose blood (ONETOUCH VERIO) test strip, USE AS INSTRUCTED TO CHECK BLOOD SUGAR ONCE A DAY, Disp: 100 strip, Rfl: 12   Magnesium Citrate 200 MG TABS, Take 400 mg by mouth daily., Disp: , Rfl:    metoprolol succinate (TOPROL XL) 25 MG 24 hr tablet, Take 1/2 tablet (12.5 mg total) by  mouth daily., Disp: 45 tablet, Rfl: 1   ondansetron (ZOFRAN) 4 MG tablet, Take 1 tablet (4 mg total) by mouth every 8 (eight) hours as needed., Disp: 20 tablet, Rfl: 2   oxyCODONE (  ROXICODONE) 5 MG immediate release tablet, Take 1 tablet every 4 hours as needed for severe pain. Use only for breakthrough pain, if your regular Percocet order is not sufficient, and the Dilaudid is not sufficient., Disp: 30 tablet, Rfl: 0   pantoprazole (PROTONIX) 40 MG tablet, TAKE 1 TABLET (40 MG TOTAL) BY MOUTH TWICE A DAY, Disp: 60 tablet, Rfl: 3   polyethylene glycol powder (GLYCOLAX/MIRALAX) 17 GM/SCOOP powder, Take 17 g by mouth 2 (two) times daily as needed for moderate constipation., Disp: , Rfl:    potassium chloride SA (KLOR-CON M) 20 MEQ tablet, Take 1 tablet by mouth daily for three days then resume taking 1 tablet every other day, Disp: 7 tablet, Rfl: 0   rosuvastatin (CRESTOR) 40 MG tablet, TAKE 1 TABLET BY MOUTH ONCE DAILY, Disp: 90 tablet, Rfl: 3   sertraline (ZOLOFT) 100 MG tablet, TAKE ONE AND ONE-HALF TABLET BY MOUTH ONCE DAILY (Patient taking differently: Take 150 mg by mouth daily.), Disp: 135 tablet, Rfl: 3   tiZANidine (ZANAFLEX) 4 MG tablet, TAKE 1 TABLET (4 MG TOTAL) BY MOUTH EVERY 6 (SIX) HOURS AS NEEDED FOR MUSCLE SPASMS., Disp: 30 tablet, Rfl: 1   traZODone (DESYREL) 50 MG tablet, Take 1 tablet (50 mg total) by mouth at bedtime as needed for sleep., Disp: 30 tablet, Rfl: 3   vitamin E 180 MG (400 UNITS) capsule, Take 1,600 Units by mouth daily., Disp: , Rfl:    Garner Nash, DO [facility] 02/14/2022 9:57 AM

## 2022-02-14 NOTE — Addendum Note (Signed)
Addended by: Suzzanne Cloud E on: 02/14/2022 10:32 AM   Modules accepted: Orders

## 2022-02-17 ENCOUNTER — Other Ambulatory Visit (HOSPITAL_BASED_OUTPATIENT_CLINIC_OR_DEPARTMENT_OTHER): Payer: Self-pay

## 2022-02-17 ENCOUNTER — Other Ambulatory Visit: Payer: Self-pay | Admitting: Physician Assistant

## 2022-02-17 LAB — PATHOLOGIST SMEAR REVIEW

## 2022-02-17 LAB — CYTOLOGY - NON PAP

## 2022-02-18 ENCOUNTER — Other Ambulatory Visit: Payer: Self-pay

## 2022-02-18 ENCOUNTER — Ambulatory Visit (INDEPENDENT_AMBULATORY_CARE_PROVIDER_SITE_OTHER): Payer: Medicare HMO | Admitting: Cardiology

## 2022-02-18 ENCOUNTER — Other Ambulatory Visit: Payer: Self-pay | Admitting: Surgical

## 2022-02-18 ENCOUNTER — Encounter: Payer: Self-pay | Admitting: Pulmonary Disease

## 2022-02-18 ENCOUNTER — Other Ambulatory Visit: Payer: Self-pay | Admitting: Family Medicine

## 2022-02-18 ENCOUNTER — Encounter: Payer: Self-pay | Admitting: Cardiology

## 2022-02-18 ENCOUNTER — Other Ambulatory Visit (HOSPITAL_BASED_OUTPATIENT_CLINIC_OR_DEPARTMENT_OTHER): Payer: Self-pay

## 2022-02-18 ENCOUNTER — Other Ambulatory Visit: Payer: Self-pay | Admitting: Physician Assistant

## 2022-02-18 VITALS — BP 118/64 | HR 58 | Ht 71.0 in | Wt 184.2 lb

## 2022-02-18 DIAGNOSIS — I25119 Atherosclerotic heart disease of native coronary artery with unspecified angina pectoris: Secondary | ICD-10-CM

## 2022-02-18 DIAGNOSIS — I10 Essential (primary) hypertension: Secondary | ICD-10-CM

## 2022-02-18 DIAGNOSIS — E1169 Type 2 diabetes mellitus with other specified complication: Secondary | ICD-10-CM

## 2022-02-18 DIAGNOSIS — G4733 Obstructive sleep apnea (adult) (pediatric): Secondary | ICD-10-CM | POA: Diagnosis not present

## 2022-02-18 DIAGNOSIS — Z79899 Other long term (current) drug therapy: Secondary | ICD-10-CM

## 2022-02-18 DIAGNOSIS — E1140 Type 2 diabetes mellitus with diabetic neuropathy, unspecified: Secondary | ICD-10-CM

## 2022-02-18 DIAGNOSIS — E785 Hyperlipidemia, unspecified: Secondary | ICD-10-CM | POA: Diagnosis not present

## 2022-02-18 DIAGNOSIS — G8929 Other chronic pain: Secondary | ICD-10-CM

## 2022-02-18 DIAGNOSIS — M25512 Pain in left shoulder: Secondary | ICD-10-CM

## 2022-02-18 DIAGNOSIS — R0602 Shortness of breath: Secondary | ICD-10-CM | POA: Diagnosis not present

## 2022-02-18 DIAGNOSIS — R110 Nausea: Secondary | ICD-10-CM

## 2022-02-18 LAB — CBC WITH DIFFERENTIAL/PLATELET
Basophils Absolute: 0 10*3/uL (ref 0.0–0.2)
Basos: 1 %
EOS (ABSOLUTE): 0.2 10*3/uL (ref 0.0–0.4)
Eos: 3 %
Hematocrit: 45.8 % (ref 37.5–51.0)
Hemoglobin: 14.2 g/dL (ref 13.0–17.7)
Immature Grans (Abs): 0 10*3/uL (ref 0.0–0.1)
Immature Granulocytes: 0 %
Lymphocytes Absolute: 1.5 10*3/uL (ref 0.7–3.1)
Lymphs: 25 %
MCH: 26.1 pg — ABNORMAL LOW (ref 26.6–33.0)
MCHC: 31 g/dL — ABNORMAL LOW (ref 31.5–35.7)
MCV: 84 fL (ref 79–97)
Monocytes Absolute: 0.6 10*3/uL (ref 0.1–0.9)
Monocytes: 9 %
Neutrophils Absolute: 3.7 10*3/uL (ref 1.4–7.0)
Neutrophils: 62 %
Platelets: 201 10*3/uL (ref 150–450)
RBC: 5.45 x10E6/uL (ref 4.14–5.80)
RDW: 13.4 % (ref 11.6–15.4)
WBC: 6.1 10*3/uL (ref 3.4–10.8)

## 2022-02-18 LAB — COMPREHENSIVE METABOLIC PANEL
ALT: 10 IU/L (ref 0–44)
AST: 19 IU/L (ref 0–40)
Albumin/Globulin Ratio: 1.9 (ref 1.2–2.2)
Albumin: 4.7 g/dL (ref 3.8–4.8)
Alkaline Phosphatase: 81 IU/L (ref 44–121)
BUN/Creatinine Ratio: 28 — ABNORMAL HIGH (ref 10–24)
BUN: 29 mg/dL — ABNORMAL HIGH (ref 8–27)
Bilirubin Total: 0.4 mg/dL (ref 0.0–1.2)
CO2: 27 mmol/L (ref 20–29)
Calcium: 10 mg/dL (ref 8.6–10.2)
Chloride: 100 mmol/L (ref 96–106)
Creatinine, Ser: 1.05 mg/dL (ref 0.76–1.27)
Globulin, Total: 2.5 g/dL (ref 1.5–4.5)
Glucose: 111 mg/dL — ABNORMAL HIGH (ref 70–99)
Potassium: 4.6 mmol/L (ref 3.5–5.2)
Sodium: 139 mmol/L (ref 134–144)
Total Protein: 7.2 g/dL (ref 6.0–8.5)
eGFR: 78 mL/min/{1.73_m2} (ref >59)

## 2022-02-18 MED ORDER — FUROSEMIDE 40 MG PO TABS
40.0000 mg | ORAL_TABLET | Freq: Every day | ORAL | 3 refills | Status: DC
  Filled 2022-02-18 – 2022-04-24 (×2): qty 90, 90d supply, fill #0
  Filled 2022-07-26: qty 90, 90d supply, fill #1

## 2022-02-18 NOTE — Patient Instructions (Signed)
Medication Instructions:  Your physician has recommended you make the following change in your medication:  TAKE: Lasix 40 mg in the morning then 20 mg on the evening for 2 weeks then go back to 40 mg daily.  *If you need a refill on your cardiac medications before your next appointment, please call your pharmacy*   Lab Work: Your physician recommends that you return for lab work in:  TODAY: CMP, CBC If you have labs (blood work) drawn today and your tests are completely normal, you will receive your results only by: [place] (if you have [place]) OR A paper copy in the mail If you have any lab test that is abnormal or we need to change your treatment, we will call you to review the results.   Testing/Procedures: None   Follow-Up: At [facility], you and your health needs are our priority.  As part of our continuing mission to provide you with exceptional heart care, we have created designated Provider Care Teams.  These Care Teams include your primary Cardiologist (physician) and Advanced Practice Providers (APPs -  Physician Assistants and Nurse Practitioners) who all work together to provide you with the care you need, when you need it.  We recommend signing up for the patient portal called "MyChart".  Sign up information is provided on this After Visit Summary.  MyChart is used to connect with patients for Virtual Visits (Telemedicine).  Patients are able to view lab/test results, encounter notes, upcoming appointments, etc.  Non-urgent messages can be sent to your provider as well.   To learn more about what you can do with MyChart, go to [email or web address].    Your next appointment:   12 week(s)  The format for your next appointment:   In Person  Provider:   Berniece Salines, DO     Other Instructions

## 2022-02-18 NOTE — Progress Notes (Signed)
Cardiology Office Note:    Date:  02/18/2022   ID:  Ronald Petersen, DOB July 22, 1956, MRN [id number]  PCP:  [name], Alferd Apa, DO  Cardiologist:  Berniece Salines, DO  Electrophysiologist:  None   Referring MD: [name], Alferd Apa, *   I am still having shortness of breath  History of Present Illness:    Ronald Petersen is a 66 y.o. 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    In December he was seen at the emergency department [facility] he subsequently underwent a left heart catheterization given the fact that he had had intermittent worsening chest pain.    I saw the patient on December 30, 2018 at that time he was willing to get back on the Imdur but he tells me he started when he started to feel tired in lots of headache so he had stopped this medication.  He continue his Ranexa.   I saw the patient on March 05, 2021 at that time he had been experiencing shortness of breath.  I started him on Lasix 40 mg every other day to see if this is going to help.  Said I saw him his sleep study reported obstructive sleep apnea and was pending CPAP titration.   I saw the patient on December 09, 2021 at that time he was experiencing chest discomfort given high suspicion for angina and sent the patient to the hospital for left heart catheterization.  He had his catheterization which showed 40% left main stenosis with ostial proximal LAD which was 95%, proximal to mid LAD stent were widely patent, and RPAV lesion is 50% stenosed.  He was then referred  for CABG.  I saw the patient January 16, 2022 at that time he reported some shortness of breath I increase his Lasix to 40 mg daily.  Blood work.  I also gave him some lidocaine as he was experiencing muscle skeletal pain.  Since I saw the patient he had been hospitalized and was noted to have pleural effusion.  He had follow-up with pulmonary in the meantime.  He is here today for follow-up visit he is with his wife.  He tells me that he still is experiencing some shortness of breath.  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   Cancer [facility])    Complication of anesthesia    aspirated with back surgery at age 47   Depression    Diabetes mellitus Type II    GERD (gastroesophageal reflux disease)    Hyperlipidemia    Hypertension    Neuromuscular disorder ([place])    NEUROPATHY   Right rotator cuff tear 11/23/2018   Sleep apnea    no CPAP    Past Surgical History:  Procedure Laterality Date   APPENDECTOMY     ARTHOSCOPIC ROTAOR CUFF REPAIR Right 11/23/2018   Procedure: ARTHROSCOPIC ROTATOR CUFF  REPAIR;  Surgeon: [name], MD;  Location: [place];  Service: Orthopedics;  Laterality: Right;   CHOLECYSTECTOMY     CORONARY ARTERY BYPASS GRAFT N/A 12/24/2021   x2 LIMA to LAD; SVG to Obtuse Marginal   CORONARY STENT INTERVENTION N/A 11/19/2020   Procedure: CORONARY STENT INTERVENTION;  Surgeon: Nelva Bush, MD;  Location: [place] [facility];  Service: Cardiovascular;  Laterality: N/A;   ENDOVEIN HARVEST OF GREATER SAPHENOUS VEIN Right 12/24/2021   Procedure: ENDOVEIN HARVEST OF GREATER SAPHENOUS VEIN;  Surgeon: Lajuana Matte, MD;  Location: [place];  Service: Open Heart Surgery;  Laterality: Right;   INTRAVASCULAR ULTRASOUND/IVUS N/A 11/19/2020   Procedure: Intravascular Ultrasound/IVUS;  Surgeon: Nelva Bush, MD;  Location: [place] [facility];  Service: Cardiovascular;   Laterality: N/A;   IR THORACENTESIS ASP PLEURAL SPACE W/IMG GUIDE  01/31/2022   LEFT HEART CATH AND CORONARY ANGIOGRAPHY N/A 11/19/2020   Procedure: LEFT HEART CATH AND CORONARY ANGIOGRAPHY;  Surgeon: Nelva Bush, MD;  Location: [place] [facility];  Service: Cardiovascular;  Laterality: N/A;   LEFT HEART CATH AND CORONARY ANGIOGRAPHY N/A 12/24/2020   Procedure: LEFT HEART CATH AND CORONARY ANGIOGRAPHY;  Surgeon: Martinique, Peter M, MD;  Location: [place] [facility];  Service: Cardiovascular;  Laterality: N/A;   LEFT HEART CATH AND CORONARY ANGIOGRAPHY N/A 12/16/2021   Procedure: LEFT HEART CATH AND CORONARY ANGIOGRAPHY;  Surgeon: Leonie Man, MD;  Location: [place] [facility];  Service: Cardiovascular;  Laterality: N/A;   LUMBAR LAMINECTOMY     SHOULDER ARTHROSCOPY WITH ROTATOR CUFF REPAIR AND SUBACROMIAL DECOMPRESSION Right 11/23/2018   Procedure: SHOULDER ARTHROSCOPY WITH ROTATOR CUFF REPAIR AND SUBACROMIAL DECOMPRESSION;  Surgeon: [name], MD;  Location: [place];  Service: Orthopedics;  Laterality: Right;   TEE WITHOUT CARDIOVERSION N/A 12/24/2021   Procedure: TRANSESOPHAGEAL ECHOCARDIOGRAM (TEE);  Surgeon: Lajuana Matte, MD;  Location: [place];  Service: Open Heart Surgery;  Laterality: N/A;    Current Medications: Current Meds  Medication Sig   acetaminophen (TYLENOL) 650 MG CR tablet Take 650 mg by mouth every 8 (eight) hours as needed for pain.   aspirin EC 325 MG EC tablet Take 1 tablet (325 mg total) by mouth daily.   Continuous Blood Gluc Receiver (FREESTYLE LIBRE 2 READER) DEVI Use as directed to check blood sugar   Continuous Blood Gluc Sensor (FREESTYLE LIBRE 2 SENSOR) MISC Use to check blood sugar, change every 14 days   dicyclomine (BENTYL) 10 MG capsule Take 1 capsule (10 mg total) by mouth every 8 (eight) hours as needed for spasms.   ELDERBERRY PO Take 300 mg by mouth daily.   empagliflozin (JARDIANCE) 10 MG TABS tablet Take 1 tablet (10 mg  total) by mouth daily.   famotidine (PEPCID) 20 MG tablet Take 1 tablet (20 mg total) by mouth daily.   fenofibrate 160 MG tablet TAKE 1 TABLET (160 MG TOTAL) BY MOUTH DAILY.   ferrous [id number] C-folic acid (TRINSICON / FOLTRIN) capsule Take 1 capsule by mouth 2 (two) times daily after a meal.   furosemide (LASIX) 40 MG tablet Take 1 tablet (40 mg total) by mouth daily.   gabapentin (NEURONTIN) 800 MG tablet Take 1 tablet (800 mg total) by mouth 3 (three) times daily.   glucose blood (ONETOUCH VERIO) test strip USE AS INSTRUCTED TO CHECK BLOOD SUGAR ONCE A DAY   Magnesium Citrate 200 MG TABS Take 400 mg by mouth daily.   metoprolol succinate (TOPROL XL) 25  MG 24 hr tablet Take 1/2 tablet (12.5 mg total) by mouth daily.   ondansetron (ZOFRAN) 4 MG tablet Take 1 tablet (4 mg total) by mouth every 8 (eight) hours as needed.   oxyCODONE (ROXICODONE) 5 MG immediate release tablet Take 1 tablet every 4 hours as needed for severe pain. Use only for breakthrough pain, if your regular Percocet order is not sufficient, and the Dilaudid is not sufficient.   pantoprazole (PROTONIX) 40 MG tablet TAKE 1 TABLET (40 MG TOTAL) BY MOUTH TWICE A DAY   polyethylene glycol powder (GLYCOLAX/MIRALAX) 17 GM/SCOOP powder Take 17 g by mouth 2 (two) times daily as needed for moderate constipation.   potassium chloride SA (KLOR-CON M) 20 MEQ tablet Take 1 tablet by mouth daily for three days then resume taking 1 tablet every other day   rosuvastatin (CRESTOR) 40 MG tablet TAKE 1 TABLET BY MOUTH ONCE DAILY   sertraline (ZOLOFT) 100 MG tablet TAKE ONE AND ONE-HALF TABLET BY MOUTH ONCE DAILY (Patient taking differently: Take 150 mg by mouth daily.)   tiZANidine (ZANAFLEX) 4 MG tablet TAKE 1 TABLET (4 MG TOTAL) BY MOUTH EVERY 6 (SIX) HOURS AS NEEDED FOR MUSCLE SPASMS.   traZODone (DESYREL) 50 MG tablet Take 1 tablet (50 mg total) by mouth at bedtime as needed for sleep.   vitamin E 180 MG (400 UNITS) capsule Take  1,600 Units by mouth daily.   [DISCONTINUED] furosemide (LASIX) 40 MG tablet Take 1 tablet (40 mg total) by mouth daily.     Allergies:   Niacin   Social History   Socioeconomic History   Marital status: Married    Spouse name: Ronald Petersen   Number of children: 2   Years of education: Not on file   Highest education level: Not on file  Occupational History   Occupation: self employed    Employer: UNEMPLOYED  Tobacco Use   Smoking status: Former    Years: 16.00    Types: Cigarettes    Quit date: 12/29/1985    Years since quitting: 36.1   Smokeless tobacco: Never  Vaping Use   Vaping Use: Never used  Substance and Sexual Activity   Alcohol use: Not Currently   Drug use: No   Sexual activity: Yes    Partners: Female  Other Topics Concern   Not on file  Social History Narrative   Exercise-- 3 days   Pt has hs degree   Right handed   One story home   Drinks caffeine   Social Determinants of Health   Financial Resource Strain: Low Risk    Difficulty of Paying Living Expenses: Not hard at all  Food Insecurity: No Food Insecurity   Worried About Charity fundraiser in the Last Year: Never true   [place] in the Last Year: Never true  Transportation Needs: No Transportation Needs   Lack of Transportation (Medical): No   Lack of Transportation (Non-Medical): No  Physical Activity: Sufficiently Active   Days of Exercise per Week: 7 days   Minutes of Exercise per Session: 30 min  Stress: No Stress Concern Present   Feeling of Stress : Only a little  Social Connections: Moderately Integrated   Frequency of Communication with Friends and Family: Three times a week   Frequency of Social Gatherings with Friends and Family: More than three times a week   Attends Religious Services: More than 4 times per year   Active Member of Clubs or Organizations: No   Attends  Archivist Meetings: Never   Marital Status: Married     Family History: The patient's family  history includes Alcohol abuse in an other family member; Arthritis in an other family member; COPD in his mother; Coronary artery disease in an other family member; Depression in an other family member; Heart disease in his sister and sister; Hypertension in an other family member; Ovarian cancer in his sister and other family members; Stomach cancer in his maternal grandmother and sister; Stroke in his father. There is no history of Colon polyps or Colon cancer.  ROS:   Review of Systems  Constitution: Negative for decreased appetite, fever and weight gain.  HENT: Negative for congestion, ear discharge, hoarse voice and sore throat.   Eyes: Negative for discharge, redness, vision loss in right eye and visual halos.  Cardiovascular: Negative for chest pain, dyspnea on exertion, leg swelling, orthopnea and palpitations.  Respiratory: Negative for cough, hemoptysis, shortness of breath and snoring.   Endocrine: Negative for heat intolerance and polyphagia.  Hematologic/Lymphatic: Negative for bleeding problem. Does not bruise/bleed easily.  Skin: Negative for flushing, nail changes, rash and suspicious lesions.  Musculoskeletal: Negative for arthritis, joint pain, muscle cramps, myalgias, neck pain and stiffness.  Gastrointestinal: Negative for abdominal pain, bowel incontinence, diarrhea and excessive appetite.  Genitourinary: Negative for decreased libido, genital sores and incomplete emptying.  Neurological: Negative for brief paralysis, focal weakness, headaches and loss of balance.  Psychiatric/Behavioral: Negative for altered mental status, depression and suicidal ideas.  Allergic/Immunologic: Negative for HIV exposure and persistent infections.    EKGs/Labs/Other Studies Reviewed:    The following studies were reviewed today:   EKG:  The ekg ordered today demonstrates sinus rhythm, heart rate 58 bpm.  LHC 12/16/2021   Mid LM to Ost LAD lesion is 40% stenosed with 40% stenosed side  branch in Ost Cx.   Ost LAD to Prox LAD lesion is 95% stenosed.   Prox LAD to Mid LAD DES Stent is widely patent   Prox Cx- 1st Mrg DES STENT is widely patent   1st Diag lesion is 25% stenosed.   RPAV lesion is 50% stenosed.   -----------------------------------   The left ventricular systolic function is normal.  The left ventricular ejection fraction is 55-65% by visual estimate.   LV end diastolic pressure is normal.   There is no aortic valve stenosis.   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 ->   Location of the stenosis and involvement of left main makes PCI of the LAD suboptimal especially in the patient who has pending surgery.  Would not be willing to stop antiplatelet agent after 3 to 6 months in the setting of Left Main Stenting. Only minimal RCA disease as well as downstream LCx and LAD disease. Preserved LVEF normal EDP.   RECOMMENDATION Based on significance of ostial left main disease and level of his symptoms, I feel it is safer to admit him to the hospital for CVTS consultation.  We will start IV heparin. Titrate home dose of statin Restart BP meds Sliding scale insulin      TTE 12/16/2021 IMPRESSIONS   1. Left ventricular ejection fraction, by estimation, is 60 to 65%. The  left ventricle has normal function. The left ventricle has no regional  wall motion abnormalities. There is moderate asymmetric left ventricular  hypertrophy of the basal-septal  segment. Left ventricular diastolic parameters  are indeterminate.   2. Right ventricular systolic function is normal. The right ventricular  size is normal. There is mildly elevated pulmonary artery systolic  pressure. The estimated right ventricular systolic pressure is 54.0 mmHg.   3. Left atrial size was mildly dilated.   4. The mitral valve is normal in structure. Trivial mitral valve   regurgitation. No evidence of mitral stenosis.   5. The aortic valve is tricuspid. Aortic valve regurgitation is trivial.  Mild aortic valve stenosis. Vmax 2.2 m/s, MG 58mmHg, AVA 2.0 cm^2, DI 0.54   6. The inferior vena cava is dilated in size with <50% respiratory  variability, suggesting right atrial pressure of 15 mmHg.   FINDINGS   Left Ventricle: Left ventricular ejection fraction, by estimation, is 60  to 65%. The left ventricle has normal function. The left ventricle has no  regional wall motion abnormalities. The left ventricular internal cavity  size was normal in size. There is   moderate asymmetric left ventricular hypertrophy of the basal-septal  segment. Left ventricular diastolic parameters are indeterminate.   Right Ventricle: The right ventricular size is normal. No increase in  right ventricular wall thickness. Right ventricular systolic function is  normal. There is mildly elevated pulmonary artery systolic pressure. The  tricuspid regurgitant velocity is 2.35   m/s, and with an assumed right atrial pressure of 15 mmHg, the estimated  right ventricular systolic pressure is 98.1 mmHg.   Left Atrium: Left atrial size was mildly dilated.   Right Atrium: Right atrial size was normal in size.   Pericardium: There is no evidence of pericardial effusion.   Mitral Valve: The mitral valve is normal in structure. Trivial mitral  valve regurgitation. No evidence of mitral valve stenosis.   Tricuspid Valve: The tricuspid valve is normal in structure. Tricuspid  valve regurgitation is trivial.   Aortic Valve: The aortic valve is tricuspid. Aortic valve regurgitation is  trivial. Mild aortic stenosis is present. Aortic valve mean gradient  measures 10.0 mmHg. Aortic valve peak gradient measures 18.1 mmHg. Aortic  valve area, by VTI measures 2.28  cm.   Pulmonic Valve: The pulmonic valve was not well visualized. Pulmonic valve  regurgitation is trivial.   Aorta: The  aortic root and ascending aorta are structurally normal, with  no evidence of dilitation.   Venous: The inferior vena cava is dilated in size with less than 50%  respiratory variability, suggesting right atrial pressure of 15 mmHg.   IAS/Shunts: The interatrial septum was not well visualized.   Recent Labs: 02/26/2021: TSH 1.82 01/24/2022: B Natriuretic Peptide 84.1 01/25/2022: Magnesium 2.1 02/05/2022: ALT 11; BUN 22; Creatinine, Ser 1.09; Hemoglobin 13.6; Platelets 261.0; Potassium 3.9; Sodium 139  Recent Lipid Panel    Component Value Date/Time   CHOL 105 12/02/2021 1508   TRIG 111.0 12/02/2021 1508   TRIG 148 12/10/2006 1603   HDL 46.40 12/02/2021 1508   CHOLHDL 2 12/02/2021 1508   VLDL 22.2 12/02/2021 1508   LDLCALC 36 12/02/2021 1508   LDLCALC 96 09/06/2020 1138   LDLDIRECT 93.0 05/31/2020 0910    Physical Exam:    VS:  BP 118/64    Pulse (!) 58    Ht 5\' 11"  (1.803 m)    Wt 184 lb 3.2 oz (83.6 kg)    SpO2 98%    BMI 25.69 kg/m     Wt Readings from Last 3 Encounters:  02/18/22 184 lb 3.2 oz (83.6 kg)  02/14/22 190 lb (86.2 kg)  02/07/22 183  lb (83 kg)     GEN: Well nourished, well developed in no acute distress HEENT: Normal NECK: No JVD; No carotid bruits LYMPHATICS: No lymphadenopathy CARDIAC: S1S2 noted,RRR, no murmurs, rubs, gallops RESPIRATORY:  Clear to auscultation without rales, wheezing or rhonchi  ABDOMEN: Soft, non-tender, non-distended, +bowel sounds, no guarding. EXTREMITIES: No edema, No cyanosis, no clubbing MUSCULOSKELETAL:  No deformity  SKIN: Warm and dry NEUROLOGIC:  Alert and oriented x 3, non-focal PSYCHIATRIC:  Normal affect, good insight  ASSESSMENT:    1. Shortness of breath   2. Medication management   3. Coronary artery disease involving native coronary artery of native heart with angina pectoris ([place])   4. Essential hypertension   5. OSA (obstructive sleep apnea)   6. Hyperlipidemia associated with type 2 diabetes mellitus ([place])    7. Type 2 diabetes mellitus with diabetic neuropathy, without long-term current use of insulin ([facility])    PLAN:    He still is short of breath, thankfully that has been improving on the Lasix.  I am going to increase the Lasix he will take 40 mg in the morning and 20 mg at nighttime.  We will get blood work today for [facility] as well as BMP.  Coronary artery disease-no anginal symptoms.  Continue his current medication regimen which include aspirin 81 mg daily, pravastatin 20 mg daily.  Diabetes mellitus-this is being managed by his primary care doctor.  No adjustments for antidiabetic medications were made today.  Hyperlipidemia - continue with current statin medication.  The patient is in agreement with the above plan. The patient left the office in stable condition.  The patient will follow up in 12 weeks.   Medication Adjustments/Labs and Tests Ordered: Current medicines are reviewed at length with the patient today.  Concerns regarding medicines are outlined above.  Orders Placed This Encounter  Procedures   Comprehensive metabolic panel   CBC with Differential/Platelet   EKG 12-Lead   Meds ordered this encounter  Medications   furosemide (LASIX) 40 MG tablet    Sig: Take 1 tablet (40 mg total) by mouth daily.    Dispense:  90 tablet    Refill:  3    Patient Instructions  Medication Instructions:  Your physician has recommended you make the following change in your medication:  TAKE: Lasix 40 mg in the morning then 20 mg on the evening for 2 weeks then go back to 40 mg daily.  *If you need a refill on your cardiac medications before your next appointment, please call your pharmacy*   Lab Work: Your physician recommends that you return for lab work in:  TODAY: CMP, CBC If you have labs (blood work) drawn today and your tests are completely normal, you will receive your results only by: [place] (if you have [place]) OR A paper copy in the mail If you have any lab  test that is abnormal or we need to change your treatment, we will call you to review the results.   Testing/Procedures: None   Follow-Up: At [facility], you and your health needs are our priority.  As part of our continuing mission to provide you with exceptional heart care, we have created designated Provider Care Teams.  These Care Teams include your primary Cardiologist (physician) and Advanced Practice Providers (APPs -  Physician Assistants and Nurse Practitioners) who all work together to provide you with the care you need, when you need it.  We recommend signing up for the patient portal called "MyChart".  Sign up information is provided on this After Visit Summary.  MyChart is used to connect with patients for Virtual Visits (Telemedicine).  Patients are able to view lab/test results, encounter notes, upcoming appointments, etc.  Non-urgent messages can be sent to your provider as well.   To learn more about what you can do with MyChart, go to [email or web address].    Your next appointment:   12 week(s)  The format for your next appointment:   In Person  Provider:   Berniece Salines, DO     Other Instructions     Adopting a Healthy Lifestyle.  Know what a healthy weight is for you (roughly BMI <25) and aim to maintain this   Aim for 7+ servings of fruits and vegetables daily   65-80+ fluid ounces of water or unsweet tea for healthy kidneys   Limit to max 1 drink of alcohol per day; avoid smoking/tobacco   Limit animal fats in diet for cholesterol and heart health - choose grass fed whenever available   Avoid highly processed foods, and foods high in saturated/trans fats   Aim for low stress - take time to unwind and care for your mental health   Aim for 150 min of moderate intensity exercise weekly for heart health, and weights twice weekly for bone health   Aim for 7-9 hours of sleep daily   When it comes to diets, agreement about the perfect plan isnt easy  to find, even among the experts. Experts at the [employer] developed an idea known as the Healthy Eating Plate. Just imagine a plate divided into logical, healthy portions.   The emphasis is on diet quality:   Load up on vegetables and fruits - one-half of your plate: Aim for color and variety, and remember that potatoes dont count.   Go for whole grains - one-quarter of your plate: Whole wheat, barley, wheat berries, quinoa, oats, brown rice, and foods made with them. If you want pasta, go with whole wheat pasta.   Protein power - one-quarter of your plate: Fish, chicken, beans, and nuts are all healthy, versatile protein sources. Limit red meat.   The diet, however, does go beyond the plate, offering a few other suggestions.   Use healthy plant oils, such as olive, canola, soy, corn, sunflower and peanut. Check the labels, and avoid partially hydrogenated oil, which have unhealthy trans fats.   If youre thirsty, drink water. Coffee and tea are good in moderation, but skip sugary drinks and limit milk and dairy products to one or two daily servings.   The type of carbohydrate in the diet is more important than the amount. Some sources of carbohydrates, such as vegetables, fruits, whole grains, and beans-are healthier than others.   Finally, stay active  Signed, Berniece Salines, DO  02/18/2022 8:50 AM    [facility]

## 2022-02-18 NOTE — Progress Notes (Signed)
Please let patient know I have reviewed his lab results from his pleural fluid analysis.  They are as expected being postoperative from his surgery.  Thanks,  BLI  Garner Nash, DO [facility] 02/18/2022 5:05 PM

## 2022-02-19 ENCOUNTER — Other Ambulatory Visit (HOSPITAL_BASED_OUTPATIENT_CLINIC_OR_DEPARTMENT_OTHER): Payer: Self-pay

## 2022-02-19 ENCOUNTER — Other Ambulatory Visit: Payer: Self-pay | Admitting: Surgical

## 2022-02-19 MED ORDER — ONDANSETRON HCL 4 MG PO TABS
4.0000 mg | ORAL_TABLET | Freq: Three times a day (TID) | ORAL | 2 refills | Status: DC | PRN
  Filled 2022-02-19: qty 20, 7d supply, fill #0
  Filled 2022-04-02: qty 20, 7d supply, fill #1
  Filled 2022-04-24: qty 20, 7d supply, fill #2

## 2022-02-19 MED ORDER — TIZANIDINE HCL 4 MG PO TABS
4.0000 mg | ORAL_TABLET | Freq: Four times a day (QID) | ORAL | 1 refills | Status: DC | PRN
  Filled 2022-02-19: qty 30, 8d supply, fill #0
  Filled 2022-03-10: qty 30, 8d supply, fill #1

## 2022-02-21 ENCOUNTER — Other Ambulatory Visit: Payer: Self-pay | Admitting: Family Medicine

## 2022-02-21 ENCOUNTER — Other Ambulatory Visit: Payer: Self-pay | Admitting: Surgical

## 2022-02-24 ENCOUNTER — Other Ambulatory Visit (HOSPITAL_BASED_OUTPATIENT_CLINIC_OR_DEPARTMENT_OTHER): Payer: Self-pay

## 2022-02-24 MED ORDER — OXYCODONE HCL 5 MG PO TABS
5.0000 mg | ORAL_TABLET | ORAL | 0 refills | Status: DC | PRN
  Filled 2022-02-24: qty 30, 5d supply, fill #0

## 2022-02-24 NOTE — Telephone Encounter (Signed)
Requesting: oxycodone Contract: 03/07/21 UDS: 03/07/21 Last Visit: 01/31/22 Next Visit: 06/03/22 Last Refill: 02/10/22  Please Advise

## 2022-02-25 ENCOUNTER — Other Ambulatory Visit: Payer: Self-pay | Admitting: Surgical

## 2022-02-25 ENCOUNTER — Other Ambulatory Visit (HOSPITAL_BASED_OUTPATIENT_CLINIC_OR_DEPARTMENT_OTHER): Payer: Self-pay

## 2022-02-25 ENCOUNTER — Other Ambulatory Visit: Payer: Self-pay | Admitting: Physician Assistant

## 2022-02-25 ENCOUNTER — Other Ambulatory Visit: Payer: Self-pay | Admitting: Cardiology

## 2022-02-25 MED ORDER — POTASSIUM CHLORIDE CRYS ER 20 MEQ PO TBCR
20.0000 meq | EXTENDED_RELEASE_TABLET | Freq: Every day | ORAL | 0 refills | Status: DC
  Filled 2022-02-25: qty 7, 7d supply, fill #0

## 2022-02-26 ENCOUNTER — Other Ambulatory Visit: Payer: Self-pay | Admitting: Surgical

## 2022-02-26 ENCOUNTER — Other Ambulatory Visit (HOSPITAL_BASED_OUTPATIENT_CLINIC_OR_DEPARTMENT_OTHER): Payer: Self-pay

## 2022-02-26 ENCOUNTER — Other Ambulatory Visit: Payer: Self-pay | Admitting: Cardiology

## 2022-02-26 MED ORDER — EMPAGLIFLOZIN 10 MG PO TABS
10.0000 mg | ORAL_TABLET | Freq: Every day | ORAL | 1 refills | Status: DC
  Filled 2022-02-26: qty 30, 30d supply, fill #0
  Filled 2022-04-10: qty 30, 30d supply, fill #1

## 2022-03-10 ENCOUNTER — Other Ambulatory Visit: Payer: Self-pay | Admitting: Family Medicine

## 2022-03-10 ENCOUNTER — Other Ambulatory Visit: Payer: Self-pay | Admitting: Cardiology

## 2022-03-10 ENCOUNTER — Other Ambulatory Visit: Payer: Self-pay | Admitting: Medical

## 2022-03-10 ENCOUNTER — Other Ambulatory Visit (HOSPITAL_BASED_OUTPATIENT_CLINIC_OR_DEPARTMENT_OTHER): Payer: Self-pay

## 2022-03-10 MED ORDER — OXYCODONE HCL 5 MG PO TABS
5.0000 mg | ORAL_TABLET | ORAL | 0 refills | Status: DC | PRN
  Filled 2022-03-10: qty 30, 5d supply, fill #0

## 2022-03-10 MED ORDER — POTASSIUM CHLORIDE CRYS ER 20 MEQ PO TBCR
20.0000 meq | EXTENDED_RELEASE_TABLET | Freq: Every day | ORAL | 3 refills | Status: DC
Start: 2022-03-10 — End: 2022-12-04
  Filled 2022-03-10: qty 48, 48d supply, fill #0
  Filled 2022-04-24: qty 48, 48d supply, fill #1
  Filled 2022-06-08: qty 45, 90d supply, fill #2

## 2022-03-10 MED ORDER — FAMOTIDINE 20 MG PO TABS
20.0000 mg | ORAL_TABLET | Freq: Every day | ORAL | 0 refills | Status: DC
  Filled 2022-03-10: qty 30, 30d supply, fill #0

## 2022-03-10 NOTE — Telephone Encounter (Signed)
Requesting: oxycodone '5mg'$   ?Contract:03/07/2021 ?UDS: 03/07/2021 ?Last Visit: 01/31/2022 ?Next Visit: 06/03/2022 ?Last Refill: 02/24/2022 #30 and 0RF ?Pt: sig 1 tab q4h prn ? ?Please Advise ? ?

## 2022-03-12 ENCOUNTER — Other Ambulatory Visit: Payer: Self-pay | Admitting: Thoracic Surgery (Cardiothoracic Vascular Surgery)

## 2022-03-12 DIAGNOSIS — Z951 Presence of aortocoronary bypass graft: Secondary | ICD-10-CM

## 2022-03-13 ENCOUNTER — Ambulatory Visit: Payer: Medicare HMO | Admitting: Cardiology

## 2022-03-13 DIAGNOSIS — G4733 Obstructive sleep apnea (adult) (pediatric): Secondary | ICD-10-CM | POA: Diagnosis not present

## 2022-03-14 ENCOUNTER — Other Ambulatory Visit: Payer: Self-pay

## 2022-03-14 ENCOUNTER — Ambulatory Visit
Admission: RE | Admit: 2022-03-14 | Discharge: 2022-03-14 | Disposition: A | Discharge status: Home / Self Care | Payer: Medicare HMO | Source: Ambulatory Visit | Attending: Thoracic Surgery (Cardiothoracic Vascular Surgery) | Admitting: Thoracic Surgery (Cardiothoracic Vascular Surgery)

## 2022-03-14 ENCOUNTER — Ambulatory Visit (INDEPENDENT_AMBULATORY_CARE_PROVIDER_SITE_OTHER): Payer: Self-pay | Admitting: Thoracic Surgery (Cardiothoracic Vascular Surgery)

## 2022-03-14 DIAGNOSIS — Z951 Presence of aortocoronary bypass graft: Secondary | ICD-10-CM

## 2022-03-14 DIAGNOSIS — R079 Chest pain, unspecified: Secondary | ICD-10-CM | POA: Diagnosis not present

## 2022-03-14 DIAGNOSIS — J90 Pleural effusion, not elsewhere classified: Secondary | ICD-10-CM

## 2022-03-14 NOTE — Progress Notes (Signed)
?   ?  [address] ?      [place] [address] ?            [phone]      ? ?Patient: Home ?Provider: Office ?Consent for Telemedicine visit obtained. ? ?Today?s visit was completed via a real-time telehealth (see specific modality noted below). The patient/authorized person provided oral consent at the time of the visit to engage in a telemedicine encounter with the present provider at [facility]. The patient/authorized person was informed of the potential benefits, limitations, and risks of telemedicine. The patient/authorized person expressed understanding that the laws that protect confidentiality also apply to telemedicine. The patient/authorized person acknowledged understanding that telemedicine does not provide emergency services and that he or she would need to call 911 or proceed to the nearest hospital for help if such a need arose. ? ? Total time spent in the clinical discussion 10 minutes. ? Telehealth Modality: Phone visit (audio only) ? ?I had a telephone visit with Ronald Petersen.  He complains of chest wall pain.  He denies any shortness of breath.  He never attended cardiac rehab.  Another referral has been placed.  His CXR is stable.  He will follow-up as needed. ? ? ?

## 2022-03-15 ENCOUNTER — Other Ambulatory Visit: Payer: Self-pay | Admitting: Surgical

## 2022-03-17 ENCOUNTER — Other Ambulatory Visit (HOSPITAL_BASED_OUTPATIENT_CLINIC_OR_DEPARTMENT_OTHER): Payer: Self-pay

## 2022-03-17 ENCOUNTER — Other Ambulatory Visit: Payer: Self-pay

## 2022-03-17 MED ORDER — POLYETHYLENE GLYCOL 3350 17 GM/SCOOP PO POWD
17.0000 g | Freq: Two times a day (BID) | ORAL | 1 refills | Status: DC | PRN
  Filled 2022-03-17: qty 510, 15d supply, fill #0
  Filled 2022-05-19: qty 510, 15d supply, fill #1

## 2022-03-24 ENCOUNTER — Other Ambulatory Visit (HOSPITAL_BASED_OUTPATIENT_CLINIC_OR_DEPARTMENT_OTHER): Payer: Self-pay

## 2022-03-24 ENCOUNTER — Other Ambulatory Visit: Payer: Self-pay | Admitting: Family Medicine

## 2022-03-24 ENCOUNTER — Other Ambulatory Visit: Payer: Self-pay

## 2022-03-24 ENCOUNTER — Encounter: Payer: Self-pay | Admitting: Cardiovascular Disease

## 2022-03-24 ENCOUNTER — Ambulatory Visit (INDEPENDENT_AMBULATORY_CARE_PROVIDER_SITE_OTHER): Payer: Medicare HMO | Admitting: Cardiovascular Disease

## 2022-03-24 VITALS — BP 126/76 | HR 55 | Ht 71.0 in | Wt 193.2 lb

## 2022-03-24 DIAGNOSIS — Z9861 Coronary angioplasty status: Secondary | ICD-10-CM | POA: Diagnosis not present

## 2022-03-24 DIAGNOSIS — I25119 Atherosclerotic heart disease of native coronary artery with unspecified angina pectoris: Secondary | ICD-10-CM

## 2022-03-24 DIAGNOSIS — G4733 Obstructive sleep apnea (adult) (pediatric): Secondary | ICD-10-CM

## 2022-03-24 DIAGNOSIS — I10 Essential (primary) hypertension: Secondary | ICD-10-CM

## 2022-03-24 DIAGNOSIS — I2510 Atherosclerotic heart disease of native coronary artery without angina pectoris: Secondary | ICD-10-CM | POA: Diagnosis not present

## 2022-03-24 DIAGNOSIS — R351 Nocturia: Secondary | ICD-10-CM

## 2022-03-24 DIAGNOSIS — R0683 Snoring: Secondary | ICD-10-CM

## 2022-03-24 DIAGNOSIS — J90 Pleural effusion, not elsewhere classified: Secondary | ICD-10-CM

## 2022-03-24 DIAGNOSIS — Z951 Presence of aortocoronary bypass graft: Secondary | ICD-10-CM

## 2022-03-24 MED ORDER — OXYCODONE HCL 5 MG PO TABS
5.0000 mg | ORAL_TABLET | ORAL | 0 refills | Status: DC | PRN
  Filled 2022-03-24: qty 30, 5d supply, fill #0

## 2022-03-24 NOTE — Progress Notes (Signed)
? ?Cardiology Office Note   ? ?Date:  03/31/2022  ? ?ID:  Ronald Petersen, DOB 06-Aug-1956, MRN [id number] ? ?PCP:  [name], DO  ?Cardiologist:  Shelva Majestic, MD (sleep); Dr. Harriet Masson ? ?New sleep evaluation ? ? ?History of Present Illness:  ?Ronald Petersen is a 66 y.o. male who is followed by Dr. Godfrey Pick Tobb etiology care and has a history of CAD status post remote PCI LAD and circumflex and on December 24, 2021 underwent CABG revascularization with a LIMA to LAD, SVG to OM.  Additional problems include diabetes mellitus, hypertension, obesity, and obstructive sleep apnea. ? ?He underwent an initial polysomnogram on March 02, 2021 which demonstrated mild overall sleep apnea overall with an AHI of 8.2 but events were moderately severe with supine position with AHI 28.0.  He had loud snoring throughout the study and had minimal rem sleep.  On June 05, 2021 he underwent CPAP titration and ultimately required BiPAP therapy due to continue advance at 16 cm of water.  Totally BiPAP was titrated to 26/22 and he had higher pressure requirement with supine sleep. ? ?Due to supply chain delay, he did not receive his ResMed air curve 10 via auto BiPAP unit until you are 16 2023.  Fortunately, the patient also developed symptomatic large pleural effusion following his CABG revascularization and ultimately required thoracentesis on 3 occasions extending from January into February with removal of over 1.5 L of fluid on his thoracentesis of January 24, 2022.  As result of this, he was unable to use CPAP therapy.  He presents to the office today for his initial compliance sleep evaluation and essentially has not used his machine since he became ill in late January. ? ?Presently, he admits to snoring, nocturia 3-5 times per night, and nonrestorative sleep.  Typically goes to bed between 10 and 11 PM and wakes up between 8 and 9 AM.  An Epworth scale was calculated in the office today and this endorsed at 5 arguing against  excessive daytime sleepiness.  He presents for his initial evaluation. ? ? ?Past Medical History:  ?Diagnosis Date  ? Anxiety   ? Arthritis   ? CAD (coronary artery disease)   ? s/p CABG x 2 in Dec '22  ? Cancer [facility])   ? Complication of anesthesia   ? aspirated with back surgery at age 27  ? Depression   ? Diabetes mellitus Type II   ? GERD (gastroesophageal reflux disease)   ? Hyperlipidemia   ? Hypertension   ? Neuromuscular disorder ([place])   ? NEUROPATHY  ? Right rotator cuff tear 11/23/2018  ? Sleep apnea   ? no CPAP  ? ? ?Past Surgical History:  ?Procedure Laterality Date  ? APPENDECTOMY    ? ARTHOSCOPIC ROTAOR CUFF REPAIR Right 11/23/2018  ? Procedure: ARTHROSCOPIC ROTATOR CUFF REPAIR;  Surgeon: [name], MD;  Location: [place];  Service: Orthopedics;  Laterality: Right;  ? CHOLECYSTECTOMY    ? CORONARY ARTERY BYPASS GRAFT N/A 12/24/2021  ? x2 LIMA to LAD; SVG to Obtuse Marginal  ? CORONARY STENT INTERVENTION N/A 11/19/2020  ? Procedure: CORONARY STENT INTERVENTION;  Surgeon: Nelva Bush, MD;  Location: [place] [facility];  Service: Cardiovascular;  Laterality: N/A;  ? ENDOVEIN HARVEST OF GREATER SAPHENOUS VEIN Right 12/24/2021  ? Procedure: ENDOVEIN HARVEST OF GREATER SAPHENOUS VEIN;  Surgeon: Lajuana Matte, MD;  Location: [place];  Service: Open Heart Surgery;  Laterality: Right;  ? INTRAVASCULAR ULTRASOUND/IVUS  N/A 11/19/2020  ? Procedure: Intravascular Ultrasound/IVUS;  Surgeon: Nelva Bush, MD;  Location: [place] [facility];  Service: Cardiovascular;  Laterality: N/A;  ? IR THORACENTESIS ASP PLEURAL SPACE W/IMG GUIDE  01/31/2022  ? LEFT HEART CATH AND CORONARY ANGIOGRAPHY N/A 11/19/2020  ? Procedure: LEFT HEART CATH AND CORONARY ANGIOGRAPHY;  Surgeon: Nelva Bush, MD;  Location: [place] [facility];  Service: Cardiovascular;  Laterality: N/A;  ? LEFT HEART CATH AND CORONARY ANGIOGRAPHY N/A 12/24/2020  ? Procedure: LEFT HEART CATH AND CORONARY ANGIOGRAPHY;  Surgeon:  Martinique, Peter M, MD;  Location: [place] [facility];  Service: Cardiovascular;  Laterality: N/A;  ? LEFT HEART CATH AND CORONARY ANGIOGRAPHY N/A 12/16/2021  ? Procedure: LEFT HEART CATH AND CORONARY ANGIOGRAPHY;  Surgeon: Leonie Man, MD;  Location: [place] [facility];  Service: Cardiovascular;  Laterality: N/A;  ? LUMBAR LAMINECTOMY    ? SHOULDER ARTHROSCOPY WITH ROTATOR CUFF REPAIR AND SUBACROMIAL DECOMPRESSION Right 11/23/2018  ? Procedure: SHOULDER ARTHROSCOPY WITH ROTATOR CUFF REPAIR AND SUBACROMIAL DECOMPRESSION;  Surgeon: [name], MD;  Location: [place];  Service: Orthopedics;  Laterality: Right;  ? TEE WITHOUT CARDIOVERSION N/A 12/24/2021  ? Procedure: TRANSESOPHAGEAL ECHOCARDIOGRAM (TEE);  Surgeon: Lajuana Matte, MD;  Location: [place];  Service: Open Heart Surgery;  Laterality: N/A;  ? ? ?Current Medications: ?Outpatient Medications Prior to Visit  ?Medication Sig Dispense Refill  ? acetaminophen (TYLENOL) 650 MG CR tablet Take 650 mg by mouth every 8 (eight) hours as needed for pain.    ? aspirin EC 325 MG EC tablet Take 1 tablet (325 mg total) by mouth daily.    ? dicyclomine (BENTYL) 10 MG capsule Take 1 capsule (10 mg total) by mouth every 8 (eight) hours as needed for spasms. 30 capsule 1  ? ELDERBERRY PO Take 300 mg by mouth daily.    ? empagliflozin (JARDIANCE) 10 MG TABS tablet Take 1 tablet (10 mg total) by mouth daily. 30 tablet 1  ? famotidine (PEPCID) 20 MG tablet Take 1 tablet (20 mg total) by mouth daily. 30 tablet 0  ? fenofibrate 160 MG tablet TAKE 1 TABLET (160 MG TOTAL) BY MOUTH DAILY. 90 tablet 1  ? furosemide (LASIX) 40 MG tablet Take 1 tablet (40 mg total) by mouth daily. 90 tablet 3  ? gabapentin (NEURONTIN) 800 MG tablet Take 1 tablet (800 mg total) by mouth 3 (three) times daily. 270 tablet 2  ? glucose blood (ONETOUCH VERIO) test strip USE AS INSTRUCTED TO CHECK BLOOD SUGAR ONCE A DAY 100 strip 12  ? Magnesium Citrate 200 MG TABS Take 400 mg by  mouth daily.    ? metoprolol succinate (TOPROL XL) 25 MG 24 hr tablet Take 1/2 tablet (12.5 mg total) by mouth daily. 45 tablet 1  ? ondansetron (ZOFRAN) 4 MG tablet Take 1 tablet (4 mg total) by mouth every 8 (eight) hours as needed. 20 tablet 2  ? pantoprazole (PROTONIX) 40 MG tablet TAKE 1 TABLET (40 MG TOTAL) BY MOUTH TWICE A DAY 60 tablet 3  ? polyethylene glycol powder (GLYCOLAX/MIRALAX) 17 GM/SCOOP powder Take 17 g by mouth 2 (two) times daily as needed for moderate constipation. 510 g 1  ? potassium chloride SA (KLOR-CON M) 20 MEQ tablet Take 1 tablet by mouth daily for three days then resume taking 1 tablet every other day 90 tablet 3  ? rosuvastatin (CRESTOR) 40 MG tablet TAKE 1 TABLET BY MOUTH ONCE DAILY 90 tablet 3  ? sertraline (ZOLOFT)  100 MG tablet TAKE ONE AND ONE-HALF TABLET BY MOUTH ONCE DAILY (Patient taking differently: Take 150 mg by mouth daily.) 135 tablet 3  ? tiZANidine (ZANAFLEX) 4 MG tablet TAKE 1 TABLET (4 MG TOTAL) BY MOUTH EVERY 6 (SIX) HOURS AS NEEDED FOR MUSCLE SPASMS. 30 tablet 1  ? traZODone (DESYREL) 50 MG tablet Take 1 tablet (50 mg total) by mouth at bedtime as needed for sleep. 30 tablet 3  ? vitamin E 180 MG (400 UNITS) capsule Take 1,600 Units by mouth daily.    ? oxyCODONE (ROXICODONE) 5 MG immediate release tablet Take 1 tablet by mouth every 4 hours as needed for severe pain. Use only for breakthrough pain, if your regular Percocet order is not sufficient, and the Dilaudid is not sufficient. 30 tablet 0  ? Continuous Blood Gluc Receiver (FREESTYLE LIBRE 2 READER) DEVI Use as directed to check blood sugar (Patient not taking: Reported on 03/24/2022) 1 each 0  ? Continuous Blood Gluc Sensor (FREESTYLE LIBRE 2 SENSOR) MISC Use to check blood sugar, change every 14 days (Patient not taking: Reported on 03/24/2022) 2 each 6  ? ferrous [id number] C-folic acid (TRINSICON / FOLTRIN) capsule Take 1 capsule by mouth 2 (two) times daily after a meal. (Patient not taking:  Reported on 03/24/2022) 60 capsule 1  ? ?No facility-administered medications prior to visit.  ?  ? ?Allergies:   Niacin  ? ?Social History  ? ?Socioeconomic History  ? Marital status: Married  ?  Spouse name:

## 2022-03-24 NOTE — Patient Instructions (Signed)
Medication Instructions:  ?No changes ?*If you need a refill on your cardiac medications before your next appointment, please call your pharmacy* ? ? ?Lab Work: ?None ordered ?If you have labs (blood work) drawn today and your tests are completely normal, you will receive your results only by: ?MyChart Message (if you have MyChart) OR ?A paper copy in the mail ?If you have any lab test that is abnormal or we need to change your treatment, we will call you to review the results. ? ? ?Testing/Procedures: ?None ordered ? ? ?Follow-Up: ?At [facility], you and your health needs are our priority.  As part of our continuing mission to provide you with exceptional heart care, we have created designated Provider Care Teams.  These Care Teams include your primary Cardiologist (physician) and Advanced Practice Providers (APPs -  Physician Assistants and Nurse Practitioners) who all work together to provide you with the care you need, when you need it. ? ?We recommend signing up for the patient portal called "MyChart".  Sign up information is provided on this After Visit Summary.  MyChart is used to connect with patients for Virtual Visits (Telemedicine).  Patients are able to view lab/test results, encounter notes, upcoming appointments, etc.  Non-urgent messages can be sent to your provider as well.   ?To learn more about what you can do with MyChart, go to [email or web address].   ? ?Your next appointment:   ?1 month(s) ? ?The format for your next appointment:   ?In Person ? ?Provider:   ?Dr. Claiborne Billings in a sleep clinic ?

## 2022-03-24 NOTE — Telephone Encounter (Signed)
Requesting: oxycodone '5mg'$   ?Contract:03/07/21 ?UDS: 03/07/2021 ?Last Visit: 01/31/22 ?Next Visit: 06/03/22 ?Last Refill: 03/10/22 #30 and 0RF ? ? ?Please Advise ? ?

## 2022-03-31 ENCOUNTER — Encounter: Payer: Self-pay | Admitting: Cardiovascular Disease

## 2022-04-02 ENCOUNTER — Other Ambulatory Visit: Payer: Self-pay | Admitting: Family Medicine

## 2022-04-03 ENCOUNTER — Other Ambulatory Visit (HOSPITAL_BASED_OUTPATIENT_CLINIC_OR_DEPARTMENT_OTHER): Payer: Self-pay

## 2022-04-04 ENCOUNTER — Other Ambulatory Visit: Payer: Self-pay | Admitting: Family Medicine

## 2022-04-04 DIAGNOSIS — G8929 Other chronic pain: Secondary | ICD-10-CM

## 2022-04-07 ENCOUNTER — Other Ambulatory Visit (HOSPITAL_BASED_OUTPATIENT_CLINIC_OR_DEPARTMENT_OTHER): Payer: Self-pay

## 2022-04-07 MED ORDER — OXYCODONE HCL 5 MG PO TABS
5.0000 mg | ORAL_TABLET | ORAL | 0 refills | Status: DC | PRN
  Filled 2022-04-07: qty 30, 5d supply, fill #0

## 2022-04-07 MED ORDER — TIZANIDINE HCL 4 MG PO TABS
4.0000 mg | ORAL_TABLET | Freq: Four times a day (QID) | ORAL | 1 refills | Status: DC | PRN
  Filled 2022-04-07: qty 30, 8d supply, fill #0
  Filled 2022-04-24: qty 30, 8d supply, fill #1

## 2022-04-07 NOTE — Telephone Encounter (Signed)
Refill request for muscle relaxant.  ?

## 2022-04-08 ENCOUNTER — Other Ambulatory Visit: Payer: Self-pay | Admitting: Family Medicine

## 2022-04-08 ENCOUNTER — Other Ambulatory Visit (HOSPITAL_BASED_OUTPATIENT_CLINIC_OR_DEPARTMENT_OTHER): Payer: Self-pay

## 2022-04-08 MED ORDER — HYDROCODONE-ACETAMINOPHEN 5-325 MG PO TABS
1.0000 | ORAL_TABLET | Freq: Four times a day (QID) | ORAL | 0 refills | Status: DC | PRN
  Filled 2022-04-08: qty 30, 8d supply, fill #0

## 2022-04-10 ENCOUNTER — Other Ambulatory Visit (HOSPITAL_BASED_OUTPATIENT_CLINIC_OR_DEPARTMENT_OTHER): Payer: Self-pay

## 2022-04-11 ENCOUNTER — Other Ambulatory Visit (HOSPITAL_BASED_OUTPATIENT_CLINIC_OR_DEPARTMENT_OTHER): Payer: Self-pay

## 2022-04-16 ENCOUNTER — Other Ambulatory Visit (HOSPITAL_BASED_OUTPATIENT_CLINIC_OR_DEPARTMENT_OTHER): Payer: Self-pay

## 2022-04-16 ENCOUNTER — Other Ambulatory Visit: Payer: Self-pay | Admitting: Medical

## 2022-04-16 MED ORDER — FAMOTIDINE 20 MG PO TABS
20.0000 mg | ORAL_TABLET | Freq: Every day | ORAL | 0 refills | Status: DC
  Filled 2022-04-16: qty 30, 30d supply, fill #0

## 2022-04-22 ENCOUNTER — Other Ambulatory Visit: Payer: Self-pay | Admitting: Family Medicine

## 2022-04-22 ENCOUNTER — Other Ambulatory Visit (HOSPITAL_BASED_OUTPATIENT_CLINIC_OR_DEPARTMENT_OTHER): Payer: Self-pay

## 2022-04-22 DIAGNOSIS — F32A Depression, unspecified: Secondary | ICD-10-CM

## 2022-04-22 MED ORDER — HYDROCODONE-ACETAMINOPHEN 5-325 MG PO TABS
1.0000 | ORAL_TABLET | Freq: Four times a day (QID) | ORAL | 0 refills | Status: DC | PRN
  Filled 2022-04-22: qty 30, 8d supply, fill #0

## 2022-04-22 MED ORDER — SERTRALINE HCL 100 MG PO TABS
ORAL_TABLET | ORAL | 3 refills | Status: DC
  Filled 2022-04-22: qty 135, 90d supply, fill #0
  Filled 2022-07-16: qty 135, 90d supply, fill #1
  Filled 2022-10-20: qty 135, 90d supply, fill #2
  Filled 2023-01-12: qty 135, 90d supply, fill #3

## 2022-04-22 NOTE — Telephone Encounter (Signed)
Pt stated he has taken rx as directed and he would like a refill as soon as possible as he is completely out.  ? ?Medication: HYDROcodone-acetaminophen (NORCO/VICODIN) 5-325 MG tablet  ? ?Has the patient contacted their pharmacy? Yes.   ? ?Preferred Pharmacy:  ?[facility]  ?[address], [place], [place] [address]  ?Phone:  [phone]  Fax:  [phone] ?

## 2022-04-22 NOTE — Telephone Encounter (Signed)
Requesting: NORCO ?Contract: 2022 ?UDS: 2022 ?Last OV: 02/05/2022 ?Next OV: 06/03/2022 ?Last Refill: 04/08/2022, #30--0RF  ?Database: ? ? ?Please advise  ? ?

## 2022-04-24 ENCOUNTER — Other Ambulatory Visit (HOSPITAL_BASED_OUTPATIENT_CLINIC_OR_DEPARTMENT_OTHER): Payer: Self-pay

## 2022-04-28 ENCOUNTER — Telehealth: Payer: Self-pay | Admitting: Family Medicine

## 2022-04-28 ENCOUNTER — Ambulatory Visit: Payer: Medicare HMO | Admitting: Cardiology

## 2022-04-28 ENCOUNTER — Other Ambulatory Visit (HOSPITAL_BASED_OUTPATIENT_CLINIC_OR_DEPARTMENT_OTHER): Payer: Self-pay

## 2022-04-28 NOTE — Telephone Encounter (Signed)
Patient would like another handicap renewal form filled out since the [facility] did not take his last once due to something being crossed out. He wanted it filled out today since his plaque is currently expired, but he was informed that forms usually take a few days to be filled out. He stated he understood but would appreciate if it was done today. He will wait for a phone call to come pick it up whenever it is ready. Please advise.  ?

## 2022-04-29 ENCOUNTER — Encounter (HOSPITAL_COMMUNITY): Payer: Self-pay

## 2022-04-29 NOTE — Telephone Encounter (Signed)
Form given to pcp to fill out. ?

## 2022-05-02 NOTE — Telephone Encounter (Signed)
Patient notified that form is ready for pickup.  Advised that he will need to put license ID and signature before leaving.  Then copy and scan. ?

## 2022-05-06 ENCOUNTER — Other Ambulatory Visit: Payer: Self-pay | Admitting: Family Medicine

## 2022-05-06 ENCOUNTER — Telehealth: Payer: Self-pay

## 2022-05-06 ENCOUNTER — Other Ambulatory Visit (HOSPITAL_BASED_OUTPATIENT_CLINIC_OR_DEPARTMENT_OTHER): Payer: Self-pay

## 2022-05-06 ENCOUNTER — Telehealth: Payer: Self-pay | Admitting: Family Medicine

## 2022-05-06 MED ORDER — OXYCODONE HCL 5 MG PO TABS
5.0000 mg | ORAL_TABLET | Freq: Four times a day (QID) | ORAL | 0 refills | Status: DC | PRN
  Filled 2022-05-06: qty 28, 7d supply, fill #0

## 2022-05-06 MED ORDER — CARVEDILOL 3.125 MG PO TABS
3.1250 mg | ORAL_TABLET | Freq: Two times a day (BID) | ORAL | 0 refills | Status: DC
  Filled 2022-05-06: qty 180, 90d supply, fill #0

## 2022-05-06 NOTE — Telephone Encounter (Signed)
Pt came into office today with his wife. He complains of his heart rate dropping at home. Dr. Harriet Masson wants to discontinue Toprol-XL and start Coreg 3.125 mg twice daily. Pt has an appt next week with Dr. Harriet Masson.  ?

## 2022-05-06 NOTE — Telephone Encounter (Signed)
Pt came picked up form, signed and put DR LIC ID, copy made and sent to scan, pt was given original.  ?

## 2022-05-06 NOTE — Telephone Encounter (Signed)
Pt came in office stating that he started with his new meds HYDROcodone-acetaminophen (NORCO/VICODIN) 5-325 MG tablet  and mentioned that meds is NOT working for him, that he use to have oxycodone and it worked better then the hydrocodone/vicodin, pt is wanting to be advised since new meds not working. Pt tel [phone]. ?

## 2022-05-07 ENCOUNTER — Other Ambulatory Visit: Payer: Self-pay | Admitting: Cardiology

## 2022-05-07 NOTE — Telephone Encounter (Signed)
Pt made aware

## 2022-05-08 ENCOUNTER — Other Ambulatory Visit (HOSPITAL_BASED_OUTPATIENT_CLINIC_OR_DEPARTMENT_OTHER): Payer: Self-pay

## 2022-05-08 ENCOUNTER — Ambulatory Visit (INDEPENDENT_AMBULATORY_CARE_PROVIDER_SITE_OTHER): Payer: Medicare HMO | Admitting: Cardiovascular Disease

## 2022-05-08 ENCOUNTER — Encounter: Payer: Self-pay | Admitting: Cardiovascular Disease

## 2022-05-08 VITALS — BP 100/58 | HR 50 | Ht 71.0 in | Wt 197.4 lb

## 2022-05-08 DIAGNOSIS — I2510 Atherosclerotic heart disease of native coronary artery without angina pectoris: Secondary | ICD-10-CM | POA: Diagnosis not present

## 2022-05-08 DIAGNOSIS — E782 Mixed hyperlipidemia: Secondary | ICD-10-CM

## 2022-05-08 DIAGNOSIS — Z9861 Coronary angioplasty status: Secondary | ICD-10-CM

## 2022-05-08 DIAGNOSIS — Z951 Presence of aortocoronary bypass graft: Secondary | ICD-10-CM | POA: Diagnosis not present

## 2022-05-08 DIAGNOSIS — R0683 Snoring: Secondary | ICD-10-CM | POA: Diagnosis not present

## 2022-05-08 DIAGNOSIS — R351 Nocturia: Secondary | ICD-10-CM | POA: Diagnosis not present

## 2022-05-08 DIAGNOSIS — G4733 Obstructive sleep apnea (adult) (pediatric): Secondary | ICD-10-CM | POA: Diagnosis not present

## 2022-05-08 NOTE — Patient Instructions (Addendum)
Medication Instructions:  ?No changes  ? ?*If you need a refill on your cardiac medications before your next appointment, please call your pharmacy* ? ? ?Lab Work: ?Not needed ? ? ? ?Testing/Procedures: ?Not needed ? ? ?Follow-Up: ?At [facility], you and your health needs are our priority.  As part of our continuing mission to provide you with exceptional heart care, we have created designated Provider Care Teams.  These Care Teams include your primary Cardiologist (physician) and Advanced Practice Providers (APPs -  Physician Assistants and Nurse Practitioners) who all work together to provide you with the care you need, when you need it. ? ?  ? ?Your next appointment:   ?As needed ? ?The format for your next appointment:   ?In Person ? ?Provider:   ?Dr Shelva Majestic  ? ? ?Other Instructions  ? pressure changes were done to your C-PAP  ? ?You have been referred to Dr Augustina Mood  - dental appliance for sleep apnea   ? ?Keep follow up appointments with Dr Harriet Masson ?

## 2022-05-08 NOTE — Progress Notes (Signed)
? ?Cardiology Office Note   ? ?Date:  05/08/2022  ? ?ID:  Ronald Petersen, DOB 1956-12-25, MRN [id number] ? ?PCP:  [name], DO  ?Cardiologist:  Shelva Majestic, MD (sleep); Dr. Harriet Masson ? ?6 week F/U sleep evaluation ? ? ?History of Present Illness:  ?Ronald Petersen is a 65 y.o. male who is followed by Dr. Godfrey Pick Tobb etiology care and has a history of CAD status post remote PCI LAD and circumflex and on December 24, 2021 underwent CABG revascularization with a LIMA to LAD, SVG to OM.  Additional problems include diabetes mellitus, hypertension, obesity, and obstructive sleep apnea.  I saw him for my initial sleep evaluation March 24, 2022.  He presents for 6-week follow-up evaluation ? ?He underwent an initial polysomnogram on March 02, 2021 which demonstrated mild overall sleep apnea overall with an AHI of 8.2 but events were moderately severe with supine position with AHI 28.0.  He had loud snoring throughout the study and had minimal rem sleep.  On June 05, 2021 he underwent CPAP titration and ultimately required BiPAP therapy due to continue advance at 16 cm of water.  Totally BiPAP was titrated to 26/22 and he had higher pressure requirement with supine sleep. ? ?Due to supply chain delay, he did not receive his ResMed air curve 10 via auto BiPAP unit until you are 16 2023.  Fortunately, the patient also developed symptomatic large pleural effusion following his CABG revascularization and ultimately required thoracentesis on 3 occasions extending from January into February with removal of over 1.5 L of fluid on his thoracentesis of January 24, 2022.  As result of this, he was unable to use CPAP therapy.  He presents to the office today for his initial compliance sleep evaluation and essentially has not used his machine since he became ill in late January. ? ?I saw him for his initial sleep evaluation on March 24, 2022 at which time he admitted to  snoring, nocturia 3-5 times per night, and  nonrestorative sleep.  Typically goes to bed between 10 and 11 PM and wakes up between 8 and 9 AM.  An Epworth scale was calculated in the office today and this endorsed at 5 arguing against excessive daytime sleepiness.  During that evaluation and considerable time with him reviewing his sleep study, titration evaluation and recent downloads.  He was not compliant I discussed with him the high likelihood that his machine may be taken back if compliance was not met was to contact his DME company to explain to him particularly due to his extensive post CABG recurrent pleural effusions external time was necessary for him to hopefully obtain compliance. ? ?Since I saw him, he apparently only used CPAP for 4 nights.  His last date of use was March 28, 2022.  He has not used therapy since and is committed now that he will not continue to pursue this.  He has had issues with his beard, sinus congestion, his face breaking out and has no intention to continue further therapy.  He is scheduled to see Dr. Godfrey Pick Tobb next week for follow-up cardiology evaluation. ? ?Past Medical History:  ?Diagnosis Date  ? Anxiety   ? Arthritis   ? CAD (coronary artery disease)   ? s/p CABG x 2 in Dec '22  ? Cancer [facility])   ? Complication of anesthesia   ? aspirated with back surgery at age 14  ? Depression   ? Diabetes mellitus Type II   ?  GERD (gastroesophageal reflux disease)   ? Hyperlipidemia   ? Hypertension   ? Neuromuscular disorder ([place])   ? NEUROPATHY  ? Right rotator cuff tear 11/23/2018  ? Sleep apnea   ? no CPAP  ? ? ?Past Surgical History:  ?Procedure Laterality Date  ? APPENDECTOMY    ? ARTHOSCOPIC ROTAOR CUFF REPAIR Right 11/23/2018  ? Procedure: ARTHROSCOPIC ROTATOR CUFF REPAIR;  Surgeon: [name], MD;  Location: [place];  Service: Orthopedics;  Laterality: Right;  ? CHOLECYSTECTOMY    ? CORONARY ARTERY BYPASS GRAFT N/A 12/24/2021  ? x2 LIMA to LAD; SVG to Obtuse Marginal  ? CORONARY STENT INTERVENTION  N/A 11/19/2020  ? Procedure: CORONARY STENT INTERVENTION;  Surgeon: Nelva Bush, MD;  Location: [place] [facility];  Service: Cardiovascular;  Laterality: N/A;  ? ENDOVEIN HARVEST OF GREATER SAPHENOUS VEIN Right 12/24/2021  ? Procedure: ENDOVEIN HARVEST OF GREATER SAPHENOUS VEIN;  Surgeon: Lajuana Matte, MD;  Location: [place];  Service: Open Heart Surgery;  Laterality: Right;  ? INTRAVASCULAR ULTRASOUND/IVUS N/A 11/19/2020  ? Procedure: Intravascular Ultrasound/IVUS;  Surgeon: Nelva Bush, MD;  Location: [place] [facility];  Service: Cardiovascular;  Laterality: N/A;  ? IR THORACENTESIS ASP PLEURAL SPACE W/IMG GUIDE  01/31/2022  ? LEFT HEART CATH AND CORONARY ANGIOGRAPHY N/A 11/19/2020  ? Procedure: LEFT HEART CATH AND CORONARY ANGIOGRAPHY;  Surgeon: Nelva Bush, MD;  Location: [place] [facility];  Service: Cardiovascular;  Laterality: N/A;  ? LEFT HEART CATH AND CORONARY ANGIOGRAPHY N/A 12/24/2020  ? Procedure: LEFT HEART CATH AND CORONARY ANGIOGRAPHY;  Surgeon: Martinique, Peter M, MD;  Location: [place] [facility];  Service: Cardiovascular;  Laterality: N/A;  ? LEFT HEART CATH AND CORONARY ANGIOGRAPHY N/A 12/16/2021  ? Procedure: LEFT HEART CATH AND CORONARY ANGIOGRAPHY;  Surgeon: Leonie Man, MD;  Location: [place] [facility];  Service: Cardiovascular;  Laterality: N/A;  ? LUMBAR LAMINECTOMY    ? SHOULDER ARTHROSCOPY WITH ROTATOR CUFF REPAIR AND SUBACROMIAL DECOMPRESSION Right 11/23/2018  ? Procedure: SHOULDER ARTHROSCOPY WITH ROTATOR CUFF REPAIR AND SUBACROMIAL DECOMPRESSION;  Surgeon: [name], MD;  Location: [place];  Service: Orthopedics;  Laterality: Right;  ? TEE WITHOUT CARDIOVERSION N/A 12/24/2021  ? Procedure: TRANSESOPHAGEAL ECHOCARDIOGRAM (TEE);  Surgeon: Lajuana Matte, MD;  Location: [place];  Service: Open Heart Surgery;  Laterality: N/A;  ? ? ?Current Medications: ?Outpatient Medications Prior to Visit  ?Medication Sig Dispense Refill  ?  acetaminophen (TYLENOL) 650 MG CR tablet Take 650 mg by mouth every 8 (eight) hours as needed for pain.    ? aspirin EC 325 MG EC tablet Take 1 tablet (325 mg total) by mouth daily.    ? carvedilol (COREG) 3.125 MG tablet Take 1 tablet (3.125 mg total) by mouth 2 (two) times daily. 180 tablet 0  ? dicyclomine (BENTYL) 10 MG capsule Take 1 capsule (10 mg total) by mouth every 8 (eight) hours as needed for spasms. 30 capsule 1  ? ELDERBERRY PO Take 300 mg by mouth daily.    ? empagliflozin (JARDIANCE) 10 MG TABS tablet Take 1 tablet (10 mg total) by mouth daily. 30 tablet 1  ? famotidine (PEPCID) 20 MG tablet Take 1 tablet (20 mg total) by mouth daily. 30 tablet 0  ? fenofibrate 160 MG tablet TAKE 1 TABLET (160 MG TOTAL) BY MOUTH DAILY. 90 tablet 1  ? furosemide (LASIX) 40 MG tablet Take 1 tablet (40 mg total) by mouth daily. 90 tablet 3  ?  gabapentin (NEURONTIN) 800 MG tablet Take 1 tablet (800 mg total) by mouth 3 (three) times daily. 270 tablet 2  ? glucose blood (ONETOUCH VERIO) test strip USE AS INSTRUCTED TO CHECK BLOOD SUGAR ONCE A DAY 100 strip 12  ? Magnesium Citrate 200 MG TABS Take 400 mg by mouth daily.    ? ondansetron (ZOFRAN) 4 MG tablet Take 1 tablet (4 mg total) by mouth every 8 (eight) hours as needed. 20 tablet 2  ? oxyCODONE (OXY IR/ROXICODONE) 5 MG immediate release tablet Take 1 tablet (5 mg total) by mouth every 6 (six) hours as needed for severe pain. 28 tablet 0  ? pantoprazole (PROTONIX) 40 MG tablet TAKE 1 TABLET (40 MG TOTAL) BY MOUTH TWICE A DAY 60 tablet 3  ? polyethylene glycol powder (GLYCOLAX/MIRALAX) 17 GM/SCOOP powder Take 17 g by mouth 2 (two) times daily as needed for moderate constipation. 510 g 1  ? potassium chloride SA (KLOR-CON M) 20 MEQ tablet Take 1 tablet by mouth daily for three days then resume taking 1 tablet every other day 90 tablet 3  ? rosuvastatin (CRESTOR) 40 MG tablet TAKE 1 TABLET BY MOUTH ONCE DAILY 90 tablet 3  ? sertraline (ZOLOFT) 100 MG tablet TAKE 1 & 1/2  TABLET BY MOUTH ONCE DAILY 135 tablet 3  ? tiZANidine (ZANAFLEX) 4 MG tablet TAKE 1 TABLET (4 MG TOTAL) BY MOUTH EVERY 6 (SIX) HOURS AS NEEDED FOR MUSCLE SPASMS. 30 tablet 1  ? traZODone (DESYREL) 50 MG tablet Take 1 tablet (

## 2022-05-09 ENCOUNTER — Other Ambulatory Visit (HOSPITAL_BASED_OUTPATIENT_CLINIC_OR_DEPARTMENT_OTHER): Payer: Self-pay

## 2022-05-09 ENCOUNTER — Other Ambulatory Visit: Payer: Self-pay | Admitting: Cardiology

## 2022-05-09 MED ORDER — EMPAGLIFLOZIN 10 MG PO TABS
10.0000 mg | ORAL_TABLET | Freq: Every day | ORAL | 1 refills | Status: DC
  Filled 2022-05-09: qty 30, 30d supply, fill #0
  Filled 2022-06-08: qty 30, 30d supply, fill #1

## 2022-05-12 ENCOUNTER — Telehealth: Payer: Self-pay

## 2022-05-12 ENCOUNTER — Telehealth (HOSPITAL_COMMUNITY): Payer: Self-pay

## 2022-05-12 NOTE — Telephone Encounter (Signed)
No response from pt regarding CR.  Closed referral.  

## 2022-05-12 NOTE — Telephone Encounter (Signed)
Called pt to let him know we needed to reschedule his appt for tomorrow. Was able to add him to Wednesday's schedule.  ?

## 2022-05-13 ENCOUNTER — Ambulatory Visit: Payer: Medicare HMO | Admitting: Cardiology

## 2022-05-14 ENCOUNTER — Other Ambulatory Visit (HOSPITAL_BASED_OUTPATIENT_CLINIC_OR_DEPARTMENT_OTHER): Payer: Self-pay

## 2022-05-14 ENCOUNTER — Ambulatory Visit
Admission: RE | Admit: 2022-05-14 | Discharge: 2022-05-14 | Disposition: A | Discharge status: Home / Self Care | Payer: Medicare HMO | Source: Ambulatory Visit | Attending: Cardiology | Admitting: Cardiology

## 2022-05-14 ENCOUNTER — Ambulatory Visit (INDEPENDENT_AMBULATORY_CARE_PROVIDER_SITE_OTHER): Payer: Medicare HMO

## 2022-05-14 ENCOUNTER — Ambulatory Visit: Payer: Medicare HMO | Admitting: Cardiology

## 2022-05-14 ENCOUNTER — Encounter: Payer: Self-pay | Admitting: Cardiology

## 2022-05-14 VITALS — BP 116/74 | HR 54 | Ht 71.0 in | Wt 194.0 lb

## 2022-05-14 DIAGNOSIS — R918 Other nonspecific abnormal finding of lung field: Secondary | ICD-10-CM | POA: Diagnosis not present

## 2022-05-14 DIAGNOSIS — E1169 Type 2 diabetes mellitus with other specified complication: Secondary | ICD-10-CM

## 2022-05-14 DIAGNOSIS — Z79899 Other long term (current) drug therapy: Secondary | ICD-10-CM

## 2022-05-14 DIAGNOSIS — Z951 Presence of aortocoronary bypass graft: Secondary | ICD-10-CM | POA: Diagnosis not present

## 2022-05-14 DIAGNOSIS — R002 Palpitations: Secondary | ICD-10-CM

## 2022-05-14 DIAGNOSIS — I2510 Atherosclerotic heart disease of native coronary artery without angina pectoris: Secondary | ICD-10-CM | POA: Diagnosis not present

## 2022-05-14 DIAGNOSIS — R0602 Shortness of breath: Secondary | ICD-10-CM

## 2022-05-14 DIAGNOSIS — R079 Chest pain, unspecified: Secondary | ICD-10-CM | POA: Diagnosis not present

## 2022-05-14 DIAGNOSIS — G4733 Obstructive sleep apnea (adult) (pediatric): Secondary | ICD-10-CM

## 2022-05-14 DIAGNOSIS — E785 Hyperlipidemia, unspecified: Secondary | ICD-10-CM | POA: Diagnosis not present

## 2022-05-14 DIAGNOSIS — I10 Essential (primary) hypertension: Secondary | ICD-10-CM | POA: Diagnosis not present

## 2022-05-14 MED ORDER — RANOLAZINE ER 500 MG PO TB12
500.0000 mg | ORAL_TABLET | Freq: Two times a day (BID) | ORAL | 3 refills | Status: DC
  Filled 2022-05-14: qty 180, 90d supply, fill #0

## 2022-05-14 NOTE — Patient Instructions (Addendum)
Medication Instructions:  ?Your physician has recommended you make the following change in your medication:  ?START: Renexa 500 mg twice daily ?*If you need a refill on your cardiac medications before your next appointment, please call your pharmacy* ? ? ?Lab Work: ?Your physician recommends that you return for lab work in:  ?TODAY: BMET, [place], CBC ?If you have labs (blood work) drawn today and your tests are completely normal, you will receive your results only by: ?MyChart Message (if you have MyChart) OR ?A paper copy in the mail ?If you have any lab test that is abnormal or we need to change your treatment, we will call you to review the results. ? ? ?Testing/Procedures: ?A Chest X-Ray was ordered today. ? ? ?ZIO XT- Long Term Monitor Instructions ? ?Your physician has requested you wear a ZIO patch monitor for 14 days.  ?This is a single patch monitor. [employer] supplies one patch monitor per enrollment. Additional ?stickers are not available. Please do not apply patch if you will be having a Nuclear Stress Test,  ?Echocardiogram, Cardiac CT, MRI, or Chest Xray during the period you would be wearing the  ?monitor. The patch cannot be worn during these tests. You cannot remove and re-apply the  ?ZIO XT patch monitor.  ?Your ZIO patch monitor will be mailed 3 day USPS to your address on file. It may take 3-5 days  ?to receive your monitor after you have been enrolled.  ?Once you have received your monitor, please review the enclosed instructions. Your monitor  ?has already been registered assigning a specific monitor serial # to you. ? ?Billing and Patient Assistance Program Information ? ?We have supplied [employer] with any of your insurance information on file for billing purposes. ?[employer] offers a sliding scale Patient Assistance Program for patients that do not have  ?insurance, or whose insurance does not completely cover the cost of the ZIO monitor.  ?You must apply for the Patient Assistance Program to qualify  for this discounted rate.  ?To apply, please call [employer] at [phone], select option 4, select option 2, ask to apply for  ?Patient Assistance Program. Theodore Demark will ask your household income, and how many people  ?are in your household. They will quote your out-of-pocket cost based on that information.  ?[employer] will also be able to set up a 34-month interest-free payment plan if needed. ? ?Applying the monitor ?  ?Shave hair from upper left chest.  ?Hold abrader disc by orange tab. Rub abrader in 40 strokes over the upper left chest as  ?indicated in your monitor instructions.  ?Clean area with 4 enclosed alcohol pads. Let dry.  ?Apply patch as indicated in monitor instructions. Patch will be placed under collarbone on left  ?side of chest with arrow pointing upward.  ?Rub patch adhesive wings for 2 minutes. Remove white label marked "1". Remove the white  ?label marked "2". Rub patch adhesive wings for 2 additional minutes.  ?While looking in a mirror, press and release button in center of patch. A small green light will  ?flash 3-4 times. This will be your only indicator that the monitor has been turned on.  ?Do not shower for the first 24 hours. You may shower after the first 24 hours.  ?Press the button if you feel a symptom. You will hear a small click. Record Date, Time and  ?Symptom in the Patient Logbook.  ?When you are ready to remove the patch, follow instructions on the last 2 pages of Patient  ?  Logbook. Stick patch monitor onto the last page of Patient Logbook.  ?Place Patient Logbook in the blue and white box. Use locking tab on box and tape box closed  ?securely. The blue and white box has prepaid postage on it. Please place it in the mailbox as  ?soon as possible. Your physician should have your test results approximately 7 days after the  ?monitor has been mailed back to [facility]  ?Call [facility] at [phone] if you have questions regarding  ?your ZIO XT patch  monitor. Call them immediately if you see an orange light blinking on your  ?monitor.  ?If your monitor falls off in less than 4 days, contact our Monitor department at [phone].  ?If your monitor becomes loose or falls off after 4 days call [employer] at [phone] for  ?suggestions on securing your monitor ? ? ? ?Follow-Up: ?At [facility], you and your health needs are our priority.  As part of our continuing mission to provide you with exceptional heart care, we have created designated Provider Care Teams.  These Care Teams include your primary Cardiologist (physician) and Advanced Practice Providers (APPs -  Physician Assistants and Nurse Practitioners) who all work together to provide you with the care you need, when you need it. ? ?We recommend signing up for the patient portal called "MyChart".  Sign up information is provided on this After Visit Summary.  MyChart is used to connect with patients for Virtual Visits (Telemedicine).  Patients are able to view lab/test results, encounter notes, upcoming appointments, etc.  Non-urgent messages can be sent to your provider as well.   ?To learn more about what you can do with MyChart, go to [email or web address].   ? ?Your next appointment:   ?4 month(s) ? ?The format for your next appointment:   ?In Person ? ?Provider:   ?Berniece Salines, DO   ? ? ?Other Instructions ?Pulmonary Function Tests ?Pulmonary function tests (PFTs) are breathing tests that are used to measure how well your lungs work, find out what is causing your lung problems, and figure out the best treatment for you. You may have PFTs: ?When you have an illness involving your lungs. ?To watch for changes in your lung function over time if you have a long-term (chronic) lung disease. ?If you are an Nature conservation officer. PFTs check the effects of being exposed to chemicals over a long period of time. ?To check for diseases that affect the lungs, such as asthma or chronic obstructive pulmonary  disease (COPD). ?To check lung function before having surgery or other procedures. ?To check your lungs, if you smoke. ?To check if prescribed medicines or treatments are helping your lungs. ?Your results will be compared with the expected lung function of someone with healthy lungs who is similar to you in several ways. These include age, sex, height, weight, and race or ethnicity. This is done to show how your lung function compares with normal lung function (percent predicted). The percent predicted helps your health care provider to know if your lung function is normal or not. If you have had PFTs done before, your health care provider will compare your current results with past results. This shows if your lung function is better, worse, or the same as before. ?Tell a health care provider about: ?Any allergies you have. ?All medicines you are taking, including inhaler or nebulizer medicines, vitamins, herbs, eye drops, creams, and over-the-counter medicines. ?Any blood disorders you have. ?Any surgeries you have had, especially  recent surgery of the eye, abdomen, or chest. These can make PFTs difficult or unsafe. ?Any medical conditions you have, including chest pain or heart problems, tuberculosis, or respiratory infections such as pneumonia, a cold, or the flu. ?Any fear of being in closed spaces (claustrophobia). Some of your tests may be in a closed space. ?What are the risks? ?Generally, this is a safe procedure. However, problems may occur, including: ?Feeling light-headed due to fast, deep breathing known as over-breathing or hyperventilation. ?An asthma attack from deep breathing. ?What happens before the procedure? ?Take over-the-counter and prescription medicines only as told by your health care provider. If you take inhaler or nebulizer medicines, ask your health care provider which medicines you should take on the day of your testing. Some inhaler medicines may interfere with PFTs if they are taken  shortly before the tests. ?Follow instructions from your health care provider about eating or drinking restrictions. These may include avoiding eating large meals and avoiding using caffeine before the t

## 2022-05-14 NOTE — Progress Notes (Unsigned)
Enrolled patient for a 14 day Zio XT  monitor to be mailed to patients home  °

## 2022-05-14 NOTE — Progress Notes (Signed)
?Cardiology Office Note:   ? ?Date:  05/14/2022  ? ?ID:  Ronald Petersen, DOB [birth year], MRN [id number] ? ?PCP:  [name], DO  ?Cardiologist:  Berniece Salines, DO  ?Electrophysiologist:  None  ? ?Referring MD: [name], Alferd Apa, *  ? ?" I am experiencing shortness of breath chest pain" ? ?History of Present Illness:   ? ?Ronald Petersen is a 66 y.o. 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  ?  ?In December he was seen at the emergency department [facility] he subsequently underwent a left heart catheterization given the fact that he had had intermittent worsening chest pain.  ?  ?I saw the patient on December 30, 2018 at that time he was willing to get back on the Imdur but he tells me he started when he started to feel tired in lots of headache so he had stopped this medication.  He continue his Ranexa. ?  ?I saw the patient on March 05, 2021 at that time he had been experiencing shortness of breath.  I started him on Lasix 40 mg every other day to see if this is going to help.  Said I saw him his sleep study reported obstructive sleep apnea and was pending CPAP titration. ?  ?I saw the patient on December 09, 2021 at that time he was experiencing chest discomfort given high suspicion for angina and sent the patient to the hospital for left heart catheterization.  He had his catheterization which showed 40% left main stenosis with ostial proximal LAD which was 95%, proximal to mid LAD stent were widely patent, and RPAV lesion is 50% stenosed.  He was  then referred for CABG. ?  ?I saw the patient January 16, 2022 at that time he reported some shortness of breath I increase his Lasix to 40 mg daily.  Blood work.  I also gave him some lidocaine as he was experiencing muscle skeletal pain. ? ?I saw the patient on February 18, 2022 at that time he was still short of breath I increase his Lasix to 40 mg twice a day.  He has titrated down the Lasix to every other day.  He is not sure if he is feeling any better on days that he takes the Lasix he is going to pay attention to his to see if there is any difference. ? ?He tells me he is also having some chest discomfort.  Midsternal.  Comes and goes.  There is also an aspect of the sternal pain he tells me that separate.  He also is experiencing some palpitations. ? ?Past Medical History:  ?Diagnosis Date  ? Anxiety   ? Arthritis   ? CAD (coronary artery disease)   ? s/p CABG x 2 in Dec '22  ? Cancer [facility])   ? Complication of anesthesia   ? aspirated with back surgery at age 42  ? Depression   ? Diabetes mellitus Type II   ? GERD (gastroesophageal reflux disease)   ?  Hyperlipidemia   ? Hypertension   ? Neuromuscular disorder ([place])   ? NEUROPATHY  ? Right rotator cuff tear 11/23/2018  ? Sleep apnea   ? no CPAP  ? ? ?Past Surgical History:  ?Procedure Laterality Date  ? APPENDECTOMY    ? ARTHOSCOPIC ROTAOR CUFF REPAIR Right 11/23/2018  ? Procedure: ARTHROSCOPIC ROTATOR CUFF REPAIR;  Surgeon: [name], MD;  Location: [place];  Service: Orthopedics;  Laterality: Right;  ? CHOLECYSTECTOMY    ? CORONARY ARTERY BYPASS GRAFT N/A 12/24/2021  ? x2 LIMA to LAD; SVG to Obtuse Marginal  ? CORONARY STENT INTERVENTION N/A 11/19/2020  ? Procedure: CORONARY STENT INTERVENTION;  Surgeon: Nelva Bush, MD;  Location: [place] CV LAB;  Service: Cardiovascular;  Laterality: N/A;  ? ENDOVEIN HARVEST OF GREATER SAPHENOUS VEIN Right 12/24/2021  ? Procedure: ENDOVEIN HARVEST OF GREATER SAPHENOUS VEIN;  Surgeon:  Lajuana Matte, MD;  Location: [place];  Service: Open Heart Surgery;  Laterality: Right;  ? INTRAVASCULAR ULTRASOUND/IVUS N/A 11/19/2020  ? Procedure: Intravascular Ultrasound/IVUS;  Surgeon: Nelva Bush, MD;  Location: [place] CV LAB;  Service: Cardiovascular;  Laterality: N/A;  ? IR THORACENTESIS ASP PLEURAL SPACE W/IMG GUIDE  01/31/2022  ? LEFT HEART CATH AND CORONARY ANGIOGRAPHY N/A 11/19/2020  ? Procedure: LEFT HEART CATH AND CORONARY ANGIOGRAPHY;  Surgeon: Nelva Bush, MD;  Location: [place] CV LAB;  Service: Cardiovascular;  Laterality: N/A;  ? LEFT HEART CATH AND CORONARY ANGIOGRAPHY N/A 12/24/2020  ? Procedure: LEFT HEART CATH AND CORONARY ANGIOGRAPHY;  Surgeon: Martinique, Peter M, MD;  Location: [place] CV LAB;  Service: Cardiovascular;  Laterality: N/A;  ? LEFT HEART CATH AND CORONARY ANGIOGRAPHY N/A 12/16/2021  ? Procedure: LEFT HEART CATH AND CORONARY ANGIOGRAPHY;  Surgeon: Leonie Man, MD;  Location: [place] CV LAB;  Service: Cardiovascular;  Laterality: N/A;  ? LUMBAR LAMINECTOMY    ? SHOULDER ARTHROSCOPY WITH ROTATOR CUFF REPAIR AND SUBACROMIAL DECOMPRESSION Right 11/23/2018  ? Procedure: SHOULDER ARTHROSCOPY WITH ROTATOR CUFF REPAIR AND SUBACROMIAL DECOMPRESSION;  Surgeon: [name], MD;  Location: [place];  Service: Orthopedics;  Laterality: Right;  ? TEE WITHOUT CARDIOVERSION N/A 12/24/2021  ? Procedure: TRANSESOPHAGEAL ECHOCARDIOGRAM (TEE);  Surgeon: Lajuana Matte, MD;  Location: [place];  Service: Open Heart Surgery;  Laterality: N/A;  ? ? ?Current Medications: ?Current Meds  ?Medication Sig  ? ranolazine (RANEXA) 500 MG 12 hr tablet Take 1 tablet (500 mg total) by mouth 2 (two) times daily.  ?  ? ?Allergies:   Niacin  ? ?Social History  ? ?Socioeconomic History  ? Marital status: Married  ?  Spouse name: Ronald Petersen  ? Number of children: 2  ? Years of education: Not on file  ? Highest education level: Not on file  ?Occupational History  ?  Occupation: self employed  ?  Employer: UNEMPLOYED  ?Tobacco Use  ? Smoking status: Former  ?  Years: 16.00  ?  Types: Cigarettes  ?  Quit date: 12/29/1985  ?  Years since quitting: 36.3  ? Smokeless tobacco: Never  ?Vaping Use  ? Vaping Use: Never used  ?Substance and Sexual Activity  ? Alcohol use: Not Currently  ? Drug use: No  ? Sexual activity: Yes  ?  Partners: Female  ?Other Topics Concern  ? Not on file  ?Social History Narrative  ? Exercise-- 3 days  ? Pt has hs degree  ? Right handed  ? One story home  ? Drinks caffeine  ? ?  Social Determinants of Health  ? ?Financial Resource Strain: Low Risk   ? Difficulty of Paying Living Expenses: Not hard at all  ?Food Insecurity: No Food Insecurity  ? Worried About Charity fundraiser in the Last Year: Never true  ? Ran Out of Food in the Last Year: Never true  ?Transportation Needs: No Transportation Needs  ? Lack of Transportation (Medical): No  ? Lack of Transportation (Non-Medical): No  ?Physical Activity: Sufficiently Active  ? Days of Exercise per Week: 7 days  ? Minutes of Exercise per Session: 30 min  ?Stress: No Stress Concern Present  ? Feeling of Stress : Only a little  ?Social Connections: Moderately Integrated  ? Frequency of Communication with Friends and Family: Three times a week  ? Frequency of Social Gatherings with Friends and Family: More than three times a week  ? Attends Religious Services: More than 4 times per year  ? Active Member of Clubs or Organizations: No  ? Attends Archivist Meetings: Never  ? Marital Status: Married  ?  ? ?Family History: ?The patient's family history includes Alcohol abuse in an other family member; Arthritis in an other family member; COPD in his mother; Coronary artery disease in an other family member; Depression in an other family member; Heart disease in his sister and sister; Hypertension in an other family member; Ovarian cancer in his sister and other family members; Stomach cancer in his maternal  grandmother and sister; Stroke in his father. There is no history of Colon polyps or Colon cancer. ? ?ROS:   ?Review of Systems  ?Constitution: Negative for decreased appetite, fever and weight gain.  ?HENT:

## 2022-05-15 ENCOUNTER — Other Ambulatory Visit (HOSPITAL_BASED_OUTPATIENT_CLINIC_OR_DEPARTMENT_OTHER): Payer: Self-pay

## 2022-05-15 ENCOUNTER — Other Ambulatory Visit: Payer: Self-pay | Admitting: Family Medicine

## 2022-05-15 LAB — CBC WITH DIFFERENTIAL/PLATELET
Basophils Absolute: 0.1 10*3/uL (ref 0.0–0.2)
Basos: 1 %
EOS (ABSOLUTE): 0.2 10*3/uL (ref 0.0–0.4)
Eos: 4 %
Hematocrit: 44.9 % (ref 37.5–51.0)
Hemoglobin: 14.7 g/dL (ref 13.0–17.7)
Immature Grans (Abs): 0 10*3/uL (ref 0.0–0.1)
Immature Granulocytes: 0 %
Lymphocytes Absolute: 1.7 10*3/uL (ref 0.7–3.1)
Lymphs: 33 %
MCH: 26.2 pg — ABNORMAL LOW (ref 26.6–33.0)
MCHC: 32.7 g/dL (ref 31.5–35.7)
MCV: 80 fL (ref 79–97)
Monocytes Absolute: 0.5 10*3/uL (ref 0.1–0.9)
Monocytes: 9 %
Neutrophils Absolute: 2.7 10*3/uL (ref 1.4–7.0)
Neutrophils: 53 %
Platelets: 174 10*3/uL (ref 150–450)
RBC: 5.61 x10E6/uL (ref 4.14–5.80)
RDW: 16.3 % — ABNORMAL HIGH (ref 11.6–15.4)
WBC: 5.1 10*3/uL (ref 3.4–10.8)

## 2022-05-15 LAB — BASIC METABOLIC PANEL
BUN/Creatinine Ratio: 33 — ABNORMAL HIGH (ref 10–24)
BUN: 30 mg/dL — ABNORMAL HIGH (ref 8–27)
CO2: 25 mmol/L (ref 20–29)
Calcium: 9.9 mg/dL (ref 8.6–10.2)
Chloride: 101 mmol/L (ref 96–106)
Creatinine, Ser: 0.9 mg/dL (ref 0.76–1.27)
Glucose: 105 mg/dL — ABNORMAL HIGH (ref 70–99)
Potassium: 4.8 mmol/L (ref 3.5–5.2)
Sodium: 140 mmol/L (ref 134–144)
eGFR: 94 mL/min/{1.73_m2} (ref >59)

## 2022-05-15 LAB — MAGNESIUM: Magnesium: 2.1 mg/dL (ref 1.6–2.3)

## 2022-05-18 DIAGNOSIS — R002 Palpitations: Secondary | ICD-10-CM | POA: Diagnosis not present

## 2022-05-19 ENCOUNTER — Other Ambulatory Visit (HOSPITAL_BASED_OUTPATIENT_CLINIC_OR_DEPARTMENT_OTHER): Payer: Self-pay

## 2022-05-19 ENCOUNTER — Other Ambulatory Visit: Payer: Self-pay | Admitting: Family Medicine

## 2022-05-19 ENCOUNTER — Encounter: Payer: Self-pay | Admitting: Cardiology

## 2022-05-19 ENCOUNTER — Telehealth: Payer: Self-pay | Admitting: *Deleted

## 2022-05-19 DIAGNOSIS — G8929 Other chronic pain: Secondary | ICD-10-CM

## 2022-05-19 DIAGNOSIS — R110 Nausea: Secondary | ICD-10-CM

## 2022-05-19 MED ORDER — FAMOTIDINE 20 MG PO TABS
20.0000 mg | ORAL_TABLET | Freq: Every day | ORAL | 0 refills | Status: DC
  Filled 2022-05-19: qty 30, 30d supply, fill #0

## 2022-05-19 MED ORDER — TIZANIDINE HCL 4 MG PO TABS
4.0000 mg | ORAL_TABLET | Freq: Four times a day (QID) | ORAL | 1 refills | Status: DC | PRN
  Filled 2022-05-19: qty 30, 8d supply, fill #0
  Filled 2022-06-22: qty 30, 8d supply, fill #1

## 2022-05-19 MED ORDER — OXYCODONE HCL 5 MG PO TABS
5.0000 mg | ORAL_TABLET | Freq: Four times a day (QID) | ORAL | 0 refills | Status: AC | PRN
  Filled 2022-05-19: qty 28, 7d supply, fill #0

## 2022-05-19 MED ORDER — ONDANSETRON HCL 4 MG PO TABS
4.0000 mg | ORAL_TABLET | Freq: Three times a day (TID) | ORAL | 0 refills | Status: DC | PRN
  Filled 2022-05-19: qty 20, 7d supply, fill #0

## 2022-05-19 NOTE — Telephone Encounter (Signed)
Order and records faxed to [facility] for oral appliance evaluation.

## 2022-05-19 NOTE — Telephone Encounter (Signed)
Requesting: OXY Contract: 2022 UDS: 2022 Last OV: 02/05/2022 Next OV: 0606/23 Last Refill: 05/06/2022, #28--0 RF Database:   Please advise

## 2022-05-21 ENCOUNTER — Other Ambulatory Visit (HOSPITAL_BASED_OUTPATIENT_CLINIC_OR_DEPARTMENT_OTHER): Payer: Self-pay

## 2022-05-21 ENCOUNTER — Other Ambulatory Visit: Payer: Self-pay | Admitting: Family Medicine

## 2022-05-21 DIAGNOSIS — G4700 Insomnia, unspecified: Secondary | ICD-10-CM

## 2022-05-21 MED ORDER — TRAZODONE HCL 50 MG PO TABS
50.0000 mg | ORAL_TABLET | Freq: Every evening | ORAL | 3 refills | Status: DC | PRN
  Filled 2022-05-21: qty 30, 30d supply, fill #0
  Filled 2022-06-30: qty 30, 30d supply, fill #1
  Filled 2022-07-29: qty 30, 30d supply, fill #2
  Filled 2022-09-01: qty 30, 30d supply, fill #3

## 2022-05-27 ENCOUNTER — Other Ambulatory Visit (HOSPITAL_BASED_OUTPATIENT_CLINIC_OR_DEPARTMENT_OTHER): Payer: Self-pay

## 2022-06-03 ENCOUNTER — Encounter: Payer: Self-pay | Admitting: Family Medicine

## 2022-06-03 ENCOUNTER — Ambulatory Visit (INDEPENDENT_AMBULATORY_CARE_PROVIDER_SITE_OTHER): Payer: Medicare HMO | Admitting: Family Medicine

## 2022-06-03 ENCOUNTER — Ambulatory Visit (HOSPITAL_BASED_OUTPATIENT_CLINIC_OR_DEPARTMENT_OTHER)
Admission: RE | Admit: 2022-06-03 | Discharge: 2022-06-03 | Disposition: A | Discharge status: Home / Self Care | Payer: Medicare HMO | Source: Ambulatory Visit | Attending: Family Medicine | Admitting: Family Medicine

## 2022-06-03 VITALS — BP 118/68 | HR 54 | Temp 97.4°F | Resp 18 | Ht 71.0 in | Wt 194.2 lb

## 2022-06-03 DIAGNOSIS — I25119 Atherosclerotic heart disease of native coronary artery with unspecified angina pectoris: Secondary | ICD-10-CM | POA: Diagnosis not present

## 2022-06-03 DIAGNOSIS — Z0000 Encounter for general adult medical examination without abnormal findings: Secondary | ICD-10-CM

## 2022-06-03 DIAGNOSIS — R351 Nocturia: Secondary | ICD-10-CM | POA: Diagnosis not present

## 2022-06-03 DIAGNOSIS — R29898 Other symptoms and signs involving the musculoskeletal system: Secondary | ICD-10-CM | POA: Insufficient documentation

## 2022-06-03 DIAGNOSIS — E1169 Type 2 diabetes mellitus with other specified complication: Secondary | ICD-10-CM | POA: Diagnosis not present

## 2022-06-03 DIAGNOSIS — I10 Essential (primary) hypertension: Secondary | ICD-10-CM

## 2022-06-03 DIAGNOSIS — I25810 Atherosclerosis of coronary artery bypass graft(s) without angina pectoris: Secondary | ICD-10-CM

## 2022-06-03 DIAGNOSIS — R5383 Other fatigue: Secondary | ICD-10-CM | POA: Diagnosis not present

## 2022-06-03 DIAGNOSIS — E1165 Type 2 diabetes mellitus with hyperglycemia: Secondary | ICD-10-CM

## 2022-06-03 DIAGNOSIS — I200 Unstable angina: Secondary | ICD-10-CM | POA: Diagnosis not present

## 2022-06-03 DIAGNOSIS — E785 Hyperlipidemia, unspecified: Secondary | ICD-10-CM | POA: Diagnosis not present

## 2022-06-03 NOTE — Assessment & Plan Note (Signed)
Per cardiology 

## 2022-06-03 NOTE — Assessment & Plan Note (Signed)
Encourage heart healthy diet such as MIND or DASH diet, increase exercise, avoid trans fats, simple carbohydrates and processed foods, consider a krill or fish or flaxseed oil cap daily.  °

## 2022-06-03 NOTE — Assessment & Plan Note (Signed)
Per card 

## 2022-06-03 NOTE — Assessment & Plan Note (Signed)
Well controlled, no changes to meds. Encouraged heart healthy diet such as the DASH diet and exercise as tolerated.  °

## 2022-06-03 NOTE — Progress Notes (Addendum)
Subjective:   By signing my name below, I, Shehryar Baig, attest that this documentation has been prepared under the direction and in the presence of [name], DO  06/03/2022    Patient ID: Ronald Petersen, male    DOB: 1956/05/16, 66 y.o.   MRN: [id number]  Chief Complaint  Patient presents with   Annual Exam    Pt states fasting     HPI Patient is in today for a office visit. He is present with his wife during this visit.   His wife reports he has fallen multiple times. He reports his legs feel weaker and lack strength. He can feel his feet. He finds that his balance his much lower. He reports yesterday while on his knees sorting out items he lost his balance and tipped over. He cannot sit on his recliner for long periods due to feeling tingling in his legs. He reports having difficulty moving both shoulders. His right shoulder occasionally locks up and is painful.  He is due for vision care and is planning on finding a provider and setting up an appointment. He continues having an odd sensation around his chest. He denies having any chest pain. His cardiologist is managing his symptoms. He reports being on a heart monitor for 2 weeks and found no new issues.    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   Cancer [facility])    Complication of anesthesia    aspirated with back surgery at age 36   Depression    Diabetes mellitus Type II    GERD (gastroesophageal reflux disease)    Hyperlipidemia    Hypertension    Neuromuscular disorder ([place])    NEUROPATHY   Right rotator cuff tear 11/23/2018   Sleep apnea    no CPAP    Past Surgical History:  Procedure Laterality Date   APPENDECTOMY     ARTHOSCOPIC ROTAOR CUFF REPAIR Right 11/23/2018   Procedure: ARTHROSCOPIC ROTATOR CUFF REPAIR;  Surgeon: [name], MD;  Location: [place];  Service: Orthopedics;  Laterality:  Right;   CHOLECYSTECTOMY     CORONARY ARTERY BYPASS GRAFT N/A 12/24/2021   x2 LIMA to LAD; SVG to Obtuse Marginal   CORONARY STENT INTERVENTION N/A 11/19/2020   Procedure: CORONARY STENT INTERVENTION;  Surgeon: Nelva Bush, MD;  Location: [place] [facility];  Service: Cardiovascular;  Laterality: N/A;   ENDOVEIN HARVEST OF GREATER SAPHENOUS VEIN Right 12/24/2021   Procedure: ENDOVEIN HARVEST OF GREATER SAPHENOUS VEIN;  Surgeon: Lajuana Matte, MD;  Location: [place];  Service: Open Heart Surgery;  Laterality: Right;   INTRAVASCULAR ULTRASOUND/IVUS N/A 11/19/2020   Procedure: Intravascular Ultrasound/IVUS;  Surgeon: Nelva Bush, MD;  Location: [place] [facility];  Service: Cardiovascular;  Laterality: N/A;   IR THORACENTESIS ASP PLEURAL SPACE W/IMG GUIDE  01/31/2022   LEFT HEART CATH AND CORONARY ANGIOGRAPHY N/A 11/19/2020   Procedure: LEFT HEART CATH AND CORONARY ANGIOGRAPHY;  Surgeon: Nelva Bush, MD;  Location: [place] [facility];  Service: Cardiovascular;  Laterality: N/A;   LEFT HEART CATH AND CORONARY ANGIOGRAPHY N/A 12/24/2020   Procedure: LEFT HEART CATH AND CORONARY ANGIOGRAPHY;  Surgeon: Martinique, Peter M, MD;  Location: [place] [facility];  Service: Cardiovascular;  Laterality: N/A;   LEFT HEART CATH AND CORONARY ANGIOGRAPHY N/A 12/16/2021   Procedure: LEFT HEART CATH AND  CORONARY ANGIOGRAPHY;  Surgeon: Leonie Man, MD;  Location: [place] [facility];  Service: Cardiovascular;  Laterality: N/A;   LUMBAR LAMINECTOMY     SHOULDER ARTHROSCOPY WITH ROTATOR CUFF REPAIR AND SUBACROMIAL DECOMPRESSION Right 11/23/2018   Procedure: SHOULDER ARTHROSCOPY WITH ROTATOR CUFF REPAIR AND SUBACROMIAL DECOMPRESSION;  Surgeon: [name], MD;  Location: [place];  Service: Orthopedics;  Laterality: Right;   TEE WITHOUT CARDIOVERSION N/A 12/24/2021   Procedure: TRANSESOPHAGEAL ECHOCARDIOGRAM (TEE);  Surgeon: Lajuana Matte, MD;  Location: [place];  Service: Open  Heart Surgery;  Laterality: N/A;    Family History  Problem Relation Age of Onset   COPD Mother    Stroke Father    Ovarian cancer Sister    Stomach cancer Sister    Heart disease Sister        MI   Heart disease Sister        MI   Stomach cancer Maternal Grandmother    Alcohol abuse Other    Depression Other    Arthritis Other    Hypertension Other    Coronary artery disease Other    Ovarian cancer Other        neice   Ovarian cancer Other        neice   Colon polyps Neg Hx    Colon cancer Neg Hx     Social History   Socioeconomic History   Marital status: Married    Spouse name: Lorriane Shire   Number of children: 2   Years of education: Not on file   Highest education level: Not on file  Occupational History   Occupation: self employed    Fish farm manager: UNEMPLOYED  Tobacco Use   Smoking status: Former    Years: 16.00    Types: Cigarettes    Quit date: 12/29/1985    Years since quitting: 36.4   Smokeless tobacco: Never  Vaping Use   Vaping Use: Never used  Substance and Sexual Activity   Alcohol use: Not Currently   Drug use: No   Sexual activity: Yes    Partners: Female  Other Topics Concern   Not on file  Social History Narrative   Exercise-- 3 days   Pt has hs degree   Right handed   One story home   Drinks caffeine   Social Determinants of Health   Financial Resource Strain: Low Risk    Difficulty of Paying Living Expenses: Not hard at all  Food Insecurity: No Food Insecurity   Worried About Charity fundraiser in the Last Year: Never true   Ran Out of Food in the Last Year: Never true  Transportation Needs: No Transportation Needs   Lack of Transportation (Medical): No   Lack of Transportation (Non-Medical): No  Physical Activity: Sufficiently Active   Days of Exercise per Week: 7 days   Minutes of Exercise per Session: 30 min  Stress: No Stress Concern Present   Feeling of Stress : Only a little  Social Connections: Moderately Integrated    Frequency of Communication with Friends and Family: Three times a week   Frequency of Social Gatherings with Friends and Family: More than three times a week   Attends Religious Services: More than 4 times per year   Active Member of [employer] or Organizations: No   Attends Archivist Meetings: Never   Marital Status: Married  Human resources officer Violence: Not At Risk   Fear of Current or Ex-Partner: No   Emotionally Abused: No  Physically Abused: No   Sexually Abused: No    Outpatient Medications Prior to Visit  Medication Sig Dispense Refill   acetaminophen (TYLENOL) 650 MG CR tablet Take 650 mg by mouth every 8 (eight) hours as needed for pain.     aspirin EC 325 MG EC tablet Take 1 tablet (325 mg total) by mouth daily.     dicyclomine (BENTYL) 10 MG capsule Take 1 capsule (10 mg total) by mouth every 8 (eight) hours as needed for spasms. 30 capsule 1   ELDERBERRY PO Take 300 mg by mouth daily.     empagliflozin (JARDIANCE) 10 MG TABS tablet Take 1 tablet (10 mg total) by mouth daily. 30 tablet 1   famotidine (PEPCID) 20 MG tablet Take 1 tablet (20 mg total) by mouth daily. 30 tablet 0   fenofibrate 160 MG tablet TAKE 1 TABLET (160 MG TOTAL) BY MOUTH DAILY. 90 tablet 1   furosemide (LASIX) 40 MG tablet Take 1 tablet (40 mg total) by mouth daily. (Patient taking differently: Take 40 mg by mouth every other day.) 90 tablet 3   gabapentin (NEURONTIN) 800 MG tablet Take 1 tablet (800 mg total) by mouth 3 (three) times daily. 270 tablet 2   glucose blood (ONETOUCH VERIO) test strip USE AS INSTRUCTED TO CHECK BLOOD SUGAR ONCE A DAY 100 strip 12   Magnesium Citrate 200 MG TABS Take 400 mg by mouth daily.     ondansetron (ZOFRAN) 4 MG tablet Take 1 tablet (4 mg total) by mouth every 8 (eight) hours as needed. 20 tablet 0   pantoprazole (PROTONIX) 40 MG tablet TAKE 1 TABLET (40 MG TOTAL) BY MOUTH TWICE A DAY 60 tablet 3   polyethylene glycol powder (GLYCOLAX/MIRALAX) 17 GM/SCOOP powder Take  17 g by mouth 2 (two) times daily as needed for moderate constipation. (Patient taking differently: Take 17 g by mouth daily.) 510 g 1   potassium chloride SA (KLOR-CON M) 20 MEQ tablet Take 1 tablet by mouth daily for three days then resume taking 1 tablet every other day (Patient taking differently: Take 20 mEq by mouth every other day.) 90 tablet 3   ranolazine (RANEXA) 500 MG 12 hr tablet Take 1 tablet (500 mg total) by mouth 2 (two) times daily. 180 tablet 3   rosuvastatin (CRESTOR) 40 MG tablet TAKE 1 TABLET BY MOUTH ONCE DAILY 90 tablet 3   sertraline (ZOLOFT) 100 MG tablet TAKE 1 & 1/2 TABLET BY MOUTH ONCE DAILY 135 tablet 3   tiZANidine (ZANAFLEX) 4 MG tablet Take 1 tablet (4 mg total) by mouth every 6 (six) hours as needed for muscle spasms. 30 tablet 1   traZODone (DESYREL) 50 MG tablet Take 1 tablet (50 mg total) by mouth at bedtime as needed for sleep. 30 tablet 3   vitamin E 180 MG (400 UNITS) capsule Take 1,600 Units by mouth daily.     carvedilol (COREG) 3.125 MG tablet Take 1 tablet (3.125 mg total) by mouth 2 (two) times daily. (Patient not taking: Reported on 05/14/2022) 180 tablet 0   No facility-administered medications prior to visit.    Allergies  Allergen Reactions   Niacin Anaphylaxis    Review of Systems  Constitutional:  Negative for fever and malaise/fatigue.  HENT:  Negative for congestion.   Eyes:  Negative for blurred vision.  Respiratory:  Negative for cough and shortness of breath.   Cardiovascular:  Negative for chest pain, palpitations and leg swelling.  Gastrointestinal:  Negative for vomiting.  Musculoskeletal:  Positive for joint pain (right shoulder). Negative for back pain.  Skin:  Negative for rash.  Neurological:  Positive for weakness (bilateral legs). Negative for tingling (bilateral feet, bilateral legs while sitting with feet up), loss of consciousness and headaches.      Objective:    Physical Exam Vitals and nursing note reviewed.   Constitutional:      General: He is not in acute distress.    Appearance: Normal appearance. He is well-developed. He is not ill-appearing.  HENT:     Head: Normocephalic and atraumatic.     Right Ear: External ear normal.     Left Ear: External ear normal.  Eyes:     Extraocular Movements: Extraocular movements intact.     Pupils: Pupils are equal, round, and reactive to light.  Neck:     Thyroid: No thyromegaly.  Cardiovascular:     Rate and Rhythm: Normal rate and regular rhythm.     Pulses:          Dorsalis pedis pulses are 2+ on the right side and 2+ on the left side.       Posterior tibial pulses are 2+ on the right side and 2+ on the left side.     Heart sounds: Normal heart sounds. No murmur heard.   No gallop.  Pulmonary:     Effort: Pulmonary effort is normal. No respiratory distress.     Breath sounds: Normal breath sounds. No wheezing or rales.  Chest:     Chest wall: No tenderness.  Musculoskeletal:        General: No tenderness.     Cervical back: Normal range of motion and neck supple.     Comments: 2/5 hip flexion in left  4/5 hip flexion on right 3/5 on left knee extension 4/5 on right knee extension  Feet:     Comments: Diabetic Foot Exam - Simple   Simple Foot Form Diabetic Foot exam was performed with the following findings: Yes 06/03/2022   2:07 PM  Visual Inspection No deformities, no ulcerations, no other skin breakdown bilaterally: Yes Sensation Testing Intact to touch and monofilament testing bilaterally: Yes Pulse Check Posterior Tibialis and Dorsalis pulse intact bilaterally: Yes Comments    Skin:    General: Skin is warm and dry.  Neurological:     Mental Status: He is alert and oriented to person, place, and time.  Psychiatric:        Behavior: Behavior normal.        Thought Content: Thought content normal.        Judgment: Judgment normal.    BP 118/68 (BP Location: Right Arm, Patient Position: Sitting, Cuff Size: Normal)   Pulse  (!) 54   Temp (!) 97.4 F (36.3 C) (Oral)   Resp 18   Ht 5' 11"  (1.803 m)   Wt 194 lb 3.2 oz (88.1 kg)   SpO2 99%   BMI 27.09 kg/m  Wt Readings from Last 3 Encounters:  06/03/22 194 lb 3.2 oz (88.1 kg)  05/14/22 194 lb (88 kg)  05/08/22 197 lb 6.4 oz (89.5 kg)    Diabetic Foot Exam - Simple   Simple Foot Form Diabetic Foot exam was performed with the following findings: Yes 06/03/2022  2:07 PM  Visual Inspection No deformities, no ulcerations, no other skin breakdown bilaterally: Yes Sensation Testing Intact to touch and monofilament testing bilaterally: Yes Pulse Check Posterior Tibialis and Dorsalis pulse intact bilaterally: Yes Comments    Lab  Results  Component Value Date   WBC 5.1 05/14/2022   HGB 14.7 05/14/2022   HCT 44.9 05/14/2022   PLT 174 05/14/2022   GLUCOSE 105 (H) 05/14/2022   CHOL 105 12/02/2021   TRIG 111.0 12/02/2021   HDL 46.40 12/02/2021   LDLDIRECT 93.0 05/31/2020   LDLCALC 36 12/02/2021   ALT 10 02/18/2022   AST 19 02/18/2022   NA 140 05/14/2022   K 4.8 05/14/2022   CL 101 05/14/2022   CREATININE 0.90 05/14/2022   BUN 30 (H) 05/14/2022   CO2 25 05/14/2022   TSH 1.82 02/26/2021   PSA 1.23 06/07/2021   INR 1.1 01/25/2022   HGBA1C 6.2 (H) 12/23/2021   MICROALBUR 1.7 05/31/2020    Lab Results  Component Value Date   TSH 1.82 02/26/2021   Lab Results  Component Value Date   WBC 5.1 05/14/2022   HGB 14.7 05/14/2022   HCT 44.9 05/14/2022   MCV 80 05/14/2022   PLT 174 05/14/2022   Lab Results  Component Value Date   NA 140 05/14/2022   K 4.8 05/14/2022   CO2 25 05/14/2022   GLUCOSE 105 (H) 05/14/2022   BUN 30 (H) 05/14/2022   CREATININE 0.90 05/14/2022   BILITOT 0.4 02/18/2022   ALKPHOS 81 02/18/2022   AST 19 02/18/2022   ALT 10 02/18/2022   PROT 7.2 02/18/2022   ALBUMIN 4.7 02/18/2022   CALCIUM 9.9 05/14/2022   ANIONGAP 10 01/25/2022   EGFR 94 05/14/2022   GFR 70.67 02/05/2022   Lab Results  Component Value Date   CHOL  105 12/02/2021   Lab Results  Component Value Date   HDL 46.40 12/02/2021   Lab Results  Component Value Date   LDLCALC 36 12/02/2021   Lab Results  Component Value Date   TRIG 111.0 12/02/2021   Lab Results  Component Value Date   CHOLHDL 2 12/02/2021   Lab Results  Component Value Date   HGBA1C 6.2 (H) 12/23/2021       Assessment & Plan:   Problem List Items Addressed This Visit       Unprioritized   Hyperlipidemia LDL goal <70 (Chronic)    Encourage heart healthy diet such as MIND or DASH diet, increase exercise, avoid trans fats, simple carbohydrates and processed foods, consider a krill or fish or flaxseed oil cap daily.        Hyperlipidemia associated with type 2 diabetes mellitus (HCC) (Chronic)    Tolerating statin, encouraged heart healthy diet, avoid trans fats, minimize simple carbs and saturated fats. Increase exercise as tolerated       Relevant Orders   Comprehensive metabolic panel   CBC with Differential/Platelet   Hemoglobin A1c   Lipid panel   TSH   Microalbumin / creatinine urine ratio   Essential hypertension (Chronic)    Well controlled, no changes to meds. Encouraged heart healthy diet such as the DASH diet and exercise as tolerated.        Coronary artery disease involving native coronary artery of native heart with angina pectoris (HCC) (Chronic)    Per card       Preventative health care - Primary   Fatigue   Relevant Orders   Testosterone, Free, Total, SHBG   Diabetes mellitus, type II ([place])   Relevant Orders   Comprehensive metabolic panel   CBC with Differential/Platelet   Hemoglobin A1c   Lipid panel   TSH   Microalbumin / creatinine urine ratio   Ambulatory referral to  Ophthalmology   Weakness of both lower extremities    ? From low back Check xray F/u Neurosurgical        Relevant Orders   DG Lumbar Spine Complete   Unstable angina [facility])    Per cardiology       Nocturia    Check psa         Relevant Orders   PSA   Coronary artery disease    Per cardiology         No orders of the defined types were placed in this encounter.   IAnn Held, DO, personally preformed the services described in this documentation.  All medical record entries made by the scribe were at my direction and in my presence.  I have reviewed the chart and discharge instructions (if applicable) and agree that the record reflects my personal performance and is accurate and complete. 06/03/2022   I,Shehryar Baig,acting as a scribe for [name], DO.,have documented all relevant documentation on the behalf of [name], DO,as directed by  [name], DO while in the presence of [name], DO.   [name], DO

## 2022-06-03 NOTE — Assessment & Plan Note (Signed)
Tolerating statin, encouraged heart healthy diet, avoid trans fats, minimize simple carbs and saturated fats. Increase exercise as tolerated 

## 2022-06-03 NOTE — Patient Instructions (Signed)
Preventive Care 65 Years and Older, Male Preventive care refers to lifestyle choices and visits with your health care provider that can promote health and wellness. Preventive care visits are also called wellness exams. What can I expect for my preventive care visit? Counseling During your preventive care visit, your health care provider may ask about your: Medical history, including: Past medical problems. Family medical history. History of falls. Current health, including: Emotional well-being. Home life and relationship well-being. Sexual activity. Memory and ability to understand (cognition). Lifestyle, including: Alcohol, nicotine or tobacco, and drug use. Access to firearms. Diet, exercise, and sleep habits. Work and work environment. Sunscreen use. Safety issues such as seatbelt and bike helmet use. Physical exam Your health care provider will check your: Height and weight. These may be used to calculate your BMI (body mass index). BMI is a measurement that tells if you are at a healthy weight. Waist circumference. This measures the distance around your waistline. This measurement also tells if you are at a healthy weight and may help predict your risk of certain diseases, such as type 2 diabetes and high blood pressure. Heart rate and blood pressure. Body temperature. Skin for abnormal spots. What immunizations do I need?  Vaccines are usually given at various ages, according to a schedule. Your health care provider will recommend vaccines for you based on your age, medical history, and lifestyle or other factors, such as travel or where you work. What tests do I need? Screening Your health care provider may recommend screening tests for certain conditions. This may include: Lipid and cholesterol levels. Diabetes screening. This is done by checking your blood sugar (glucose) after you have not eaten for a while (fasting). Hepatitis C test. Hepatitis B test. HIV (human  immunodeficiency virus) test. STI (sexually transmitted infection) testing, if you are at risk. Lung cancer screening. Colorectal cancer screening. Prostate cancer screening. Abdominal aortic aneurysm (AAA) screening. You may need this if you are a current or former smoker. Talk with your health care provider about your test results, treatment options, and if necessary, the need for more tests. Follow these instructions at home: Eating and drinking  Eat a diet that includes fresh fruits and vegetables, whole grains, lean protein, and low-fat dairy products. Limit your intake of foods with high amounts of sugar, saturated fats, and salt. Take vitamin and mineral supplements as recommended by your health care provider. Do not drink alcohol if your health care provider tells you not to drink. If you drink alcohol: Limit how much you have to 0-2 drinks a day. Know how much alcohol is in your drink. In the [place] one drink equals one 12 oz bottle of beer (355 mL), one 5 oz glass of wine (148 mL), or one 1 oz glass of hard liquor (44 mL). Lifestyle Brush your teeth every morning and night with fluoride toothpaste. Floss one time each day. Exercise for at least 30 minutes 5 or more days each week. Do not use any products that contain nicotine or tobacco. These products include cigarettes, chewing tobacco, and vaping devices, such as e-cigarettes. If you need help quitting, ask your health care provider. Do not use drugs. If you are sexually active, practice safe sex. Use a condom or other form of protection to prevent STIs. Take aspirin only as told by your health care provider. Make sure that you understand how much to take and what form to take. Work with your health care provider to find out whether it is safe   and beneficial for you to take aspirin daily. Ask your health care provider if you need to take a cholesterol-lowering medicine (statin). Find healthy ways to manage stress, such  as: Meditation, yoga, or listening to music. Journaling. Talking to a trusted person. Spending time with friends and family. Safety Always wear your seat belt while driving or riding in a vehicle. Do not drive: If you have been drinking alcohol. Do not ride with someone who has been drinking. When you are tired or distracted. While texting. If you have been using any mind-altering substances or drugs. Wear a helmet and other protective equipment during sports activities. If you have firearms in your house, make sure you follow all gun safety procedures. Minimize exposure to UV radiation to reduce your risk of skin cancer. What's next? Visit your health care provider once a year for an annual wellness visit. Ask your health care provider how often you should have your eyes and teeth checked. Stay up to date on all vaccines. This information is not intended to replace advice given to you by your health care provider. Make sure you discuss any questions you have with your health care provider. Document Revised: 06/12/2021 Document Reviewed: 06/12/2021 Elsevier Patient Education  2023 [employer]vier Inc.  

## 2022-06-03 NOTE — Assessment & Plan Note (Signed)
Check psa 

## 2022-06-03 NOTE — Assessment & Plan Note (Signed)
?   From low back Check xray F/u Neurosurgical

## 2022-06-04 LAB — CBC WITH DIFFERENTIAL/PLATELET
Basophils Absolute: 0 10*3/uL (ref 0.0–0.1)
Basophils Relative: 0.4 % (ref 0.0–3.0)
Eosinophils Absolute: 0.1 10*3/uL (ref 0.0–0.7)
Eosinophils Relative: 3.1 % (ref 0.0–5.0)
HCT: 42.3 % (ref 39.0–52.0)
Hemoglobin: 14 g/dL (ref 13.0–17.0)
Lymphocytes Relative: 35.9 % (ref 12.0–46.0)
Lymphs Abs: 1.7 10*3/uL (ref 0.7–4.0)
MCHC: 33 g/dL (ref 30.0–36.0)
MCV: 80 fl (ref 78.0–100.0)
Monocytes Absolute: 0.4 10*3/uL (ref 0.1–1.0)
Monocytes Relative: 8.8 % (ref 3.0–12.0)
Neutro Abs: 2.5 10*3/uL (ref 1.4–7.7)
Neutrophils Relative %: 51.8 % (ref 43.0–77.0)
Platelets: 175 10*3/uL (ref 150.0–400.0)
RBC: 5.29 Mil/uL (ref 4.22–5.81)
RDW: 17.2 % — ABNORMAL HIGH (ref 11.5–15.5)
WBC: 4.8 10*3/uL (ref 4.0–10.5)

## 2022-06-04 LAB — COMPREHENSIVE METABOLIC PANEL
ALT: 13 U/L (ref 0–53)
AST: 23 U/L (ref 0–37)
Albumin: 4.4 g/dL (ref 3.5–5.2)
Alkaline Phosphatase: 63 U/L (ref 39–117)
BUN: 21 mg/dL (ref 6–23)
CO2: 29 mEq/L (ref 19–32)
Calcium: 9.8 mg/dL (ref 8.4–10.5)
Chloride: 100 mEq/L (ref 96–112)
Creatinine, Ser: 0.92 mg/dL (ref 0.40–1.50)
GFR: 86.42 mL/min (ref >60.00)
Glucose, Bld: 74 mg/dL (ref 70–99)
Potassium: 4.3 mEq/L (ref 3.5–5.1)
Sodium: 137 mEq/L (ref 135–145)
Total Bilirubin: 0.8 mg/dL (ref 0.2–1.2)
Total Protein: 7.1 g/dL (ref 6.0–8.3)

## 2022-06-04 LAB — LIPID PANEL
Cholesterol: 103 mg/dL (ref 0–200)
HDL: 46.4 mg/dL (ref >39.00)
LDL Cholesterol: 44 mg/dL (ref 0–99)
NonHDL: 56.2
Total CHOL/HDL Ratio: 2
Triglycerides: 61 mg/dL (ref 0.0–149.0)
VLDL: 12.2 mg/dL (ref 0.0–40.0)

## 2022-06-04 LAB — HEMOGLOBIN A1C: Hgb A1c MFr Bld: 7.2 % — ABNORMAL HIGH (ref 4.6–6.5)

## 2022-06-04 LAB — MICROALBUMIN / CREATININE URINE RATIO
Creatinine,U: 48.8 mg/dL
Microalb Creat Ratio: 1.4 mg/g (ref 0.0–30.0)
Microalb, Ur: <0.7 mg/dL (ref 0.0–1.9)

## 2022-06-04 LAB — PSA: PSA: 1.35 ng/mL (ref 0.10–4.00)

## 2022-06-04 LAB — TSH: TSH: 1.04 u[IU]/mL (ref 0.35–5.50)

## 2022-06-05 ENCOUNTER — Other Ambulatory Visit (HOSPITAL_BASED_OUTPATIENT_CLINIC_OR_DEPARTMENT_OTHER): Payer: Self-pay

## 2022-06-05 LAB — TESTOSTERONE, FREE, TOTAL, SHBG
Sex Hormone Binding: 105 nmol/L — ABNORMAL HIGH (ref 19.3–76.4)
Testosterone, Free: 4.6 pg/mL — ABNORMAL LOW (ref 6.6–18.1)
Testosterone: 517 ng/dL (ref 264–916)

## 2022-06-06 ENCOUNTER — Encounter: Payer: Self-pay | Admitting: Neurology

## 2022-06-06 ENCOUNTER — Other Ambulatory Visit: Payer: Self-pay

## 2022-06-06 DIAGNOSIS — R29898 Other symptoms and signs involving the musculoskeletal system: Secondary | ICD-10-CM

## 2022-06-06 DIAGNOSIS — G629 Polyneuropathy, unspecified: Secondary | ICD-10-CM

## 2022-06-09 ENCOUNTER — Other Ambulatory Visit (HOSPITAL_BASED_OUTPATIENT_CLINIC_OR_DEPARTMENT_OTHER): Payer: Self-pay

## 2022-06-09 DIAGNOSIS — R002 Palpitations: Secondary | ICD-10-CM | POA: Diagnosis not present

## 2022-06-11 ENCOUNTER — Other Ambulatory Visit: Payer: Self-pay

## 2022-06-11 DIAGNOSIS — R7989 Other specified abnormal findings of blood chemistry: Secondary | ICD-10-CM

## 2022-06-22 ENCOUNTER — Other Ambulatory Visit: Payer: Self-pay | Admitting: Family Medicine

## 2022-06-22 DIAGNOSIS — R110 Nausea: Secondary | ICD-10-CM

## 2022-06-23 ENCOUNTER — Other Ambulatory Visit (HOSPITAL_BASED_OUTPATIENT_CLINIC_OR_DEPARTMENT_OTHER): Payer: Self-pay

## 2022-06-23 MED ORDER — FAMOTIDINE 20 MG PO TABS
20.0000 mg | ORAL_TABLET | Freq: Every day | ORAL | 2 refills | Status: DC
  Filled 2022-06-23: qty 30, 30d supply, fill #0
  Filled 2022-07-16: qty 30, 30d supply, fill #1
  Filled 2022-08-14: qty 30, 30d supply, fill #2

## 2022-06-23 MED ORDER — PANTOPRAZOLE SODIUM 40 MG PO TBEC
DELAYED_RELEASE_TABLET | ORAL | 3 refills | Status: DC
  Filled 2022-06-23: qty 60, 30d supply, fill #0
  Filled 2022-07-16: qty 60, 30d supply, fill #1
  Filled 2022-08-19: qty 60, 30d supply, fill #2
  Filled 2022-09-15: qty 60, 30d supply, fill #3

## 2022-06-23 MED ORDER — ONDANSETRON HCL 4 MG PO TABS
4.0000 mg | ORAL_TABLET | Freq: Three times a day (TID) | ORAL | 2 refills | Status: DC | PRN
  Filled 2022-06-23: qty 20, 7d supply, fill #0
  Filled 2022-07-16: qty 20, 7d supply, fill #1
  Filled 2022-07-29: qty 20, 7d supply, fill #2

## 2022-07-01 ENCOUNTER — Other Ambulatory Visit (HOSPITAL_BASED_OUTPATIENT_CLINIC_OR_DEPARTMENT_OTHER): Payer: Self-pay

## 2022-07-02 ENCOUNTER — Other Ambulatory Visit (HOSPITAL_BASED_OUTPATIENT_CLINIC_OR_DEPARTMENT_OTHER): Payer: Self-pay

## 2022-07-16 ENCOUNTER — Other Ambulatory Visit: Payer: Self-pay | Admitting: Family Medicine

## 2022-07-16 ENCOUNTER — Other Ambulatory Visit (HOSPITAL_BASED_OUTPATIENT_CLINIC_OR_DEPARTMENT_OTHER): Payer: Self-pay

## 2022-07-16 DIAGNOSIS — G8929 Other chronic pain: Secondary | ICD-10-CM

## 2022-07-16 MED ORDER — TIZANIDINE HCL 4 MG PO TABS
4.0000 mg | ORAL_TABLET | Freq: Four times a day (QID) | ORAL | 1 refills | Status: DC | PRN
  Filled 2022-07-16: qty 30, 8d supply, fill #0
  Filled 2022-07-29: qty 30, 8d supply, fill #1

## 2022-07-24 ENCOUNTER — Other Ambulatory Visit (HOSPITAL_BASED_OUTPATIENT_CLINIC_OR_DEPARTMENT_OTHER): Payer: Self-pay

## 2022-07-24 ENCOUNTER — Other Ambulatory Visit: Payer: Self-pay | Admitting: Cardiology

## 2022-07-24 MED ORDER — EMPAGLIFLOZIN 10 MG PO TABS
10.0000 mg | ORAL_TABLET | Freq: Every day | ORAL | 6 refills | Status: DC
  Filled 2022-07-24: qty 30, 30d supply, fill #0

## 2022-07-27 ENCOUNTER — Other Ambulatory Visit (HOSPITAL_BASED_OUTPATIENT_CLINIC_OR_DEPARTMENT_OTHER): Payer: Self-pay

## 2022-07-28 ENCOUNTER — Other Ambulatory Visit (HOSPITAL_BASED_OUTPATIENT_CLINIC_OR_DEPARTMENT_OTHER): Payer: Self-pay

## 2022-07-30 ENCOUNTER — Other Ambulatory Visit (HOSPITAL_BASED_OUTPATIENT_CLINIC_OR_DEPARTMENT_OTHER): Payer: Self-pay

## 2022-08-04 ENCOUNTER — Other Ambulatory Visit: Payer: Self-pay | Admitting: Family Medicine

## 2022-08-04 ENCOUNTER — Ambulatory Visit: Payer: Medicare HMO | Admitting: Family Medicine

## 2022-08-04 ENCOUNTER — Other Ambulatory Visit (HOSPITAL_BASED_OUTPATIENT_CLINIC_OR_DEPARTMENT_OTHER): Payer: Self-pay

## 2022-08-04 ENCOUNTER — Encounter: Payer: Self-pay | Admitting: Family Medicine

## 2022-08-04 VITALS — BP 108/80 | Ht 71.0 in | Wt 194.0 lb

## 2022-08-04 DIAGNOSIS — M5412 Radiculopathy, cervical region: Secondary | ICD-10-CM | POA: Diagnosis not present

## 2022-08-04 DIAGNOSIS — G8929 Other chronic pain: Secondary | ICD-10-CM

## 2022-08-04 DIAGNOSIS — E1169 Type 2 diabetes mellitus with other specified complication: Secondary | ICD-10-CM

## 2022-08-04 MED ORDER — TIZANIDINE HCL 4 MG PO TABS
4.0000 mg | ORAL_TABLET | Freq: Four times a day (QID) | ORAL | 1 refills | Status: DC | PRN
  Filled 2022-08-04: qty 30, 8d supply, fill #0
  Filled 2022-09-01: qty 30, 8d supply, fill #1

## 2022-08-04 MED ORDER — FENOFIBRATE 160 MG PO TABS
ORAL_TABLET | Freq: Every day | ORAL | 1 refills | Status: DC
  Filled 2022-08-04: qty 90, 90d supply, fill #0
  Filled 2022-11-09: qty 90, 90d supply, fill #1

## 2022-08-04 NOTE — Patient Instructions (Signed)
Good to see you Please try heat  Please try the exercises  I will put an order in for the epidural at [facility] imaging. Please send me a message in MyChart with any questions or updates.  Please give me a call a week after the epidural is performed.   --Dr. Raeford Razor

## 2022-08-04 NOTE — Progress Notes (Signed)
Ronald Petersen - 66 y.o. male MRN [id number]  Date of birth: [birth year]  SUBJECTIVE:  Including CC & ROS.  No chief complaint on file.   Ronald Petersen is a 66 y.o. male that is presenting with acute on chronic left-sided radicular pain.  This is been bothering him for several years.  He has previously seen a surgeon that had discussed with him a two-level fusion for severe foraminal stenosis with worse on the left than the right.  He subsequently had significant cardiovascular problems since that discussion.  Continues to have symptoms.  Review of the note from 03/12/2001 shows that he had discussions about surgery with his surgeon. 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 Independent review of the cervical spine x-ray from 2022 is showing severe degenerative changes at C5-6 and C6-7.  Review of Systems See HPI   HISTORY: Past Medical, Surgical, Social, and Family History Reviewed & Updated per EMR.   Pertinent Historical Findings include:  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   Cancer [facility])    Complication of anesthesia    aspirated with back surgery at age 21   Depression    Diabetes mellitus Type II    GERD (gastroesophageal reflux disease)    Hyperlipidemia    Hypertension    Neuromuscular disorder ([place])    NEUROPATHY   Right rotator cuff tear 11/23/2018   Sleep apnea    no CPAP    Past Surgical History:  Procedure Laterality Date   APPENDECTOMY     ARTHOSCOPIC ROTAOR CUFF REPAIR Right 11/23/2018   Procedure: ARTHROSCOPIC ROTATOR CUFF REPAIR;  Surgeon: [name], MD;  Location: [place];  Service: Orthopedics;  Laterality: Right;   CHOLECYSTECTOMY     CORONARY ARTERY BYPASS GRAFT N/A 12/24/2021   x2 LIMA to LAD; SVG to Obtuse Marginal   CORONARY STENT INTERVENTION N/A  11/19/2020   Procedure: CORONARY STENT INTERVENTION;  Surgeon: Nelva Bush, MD;  Location: [place] [facility];  Service: Cardiovascular;  Laterality: N/A;   ENDOVEIN HARVEST OF GREATER SAPHENOUS VEIN Right 12/24/2021   Procedure: ENDOVEIN HARVEST OF GREATER SAPHENOUS VEIN;  Surgeon: Lajuana Matte, MD;  Location: [place];  Service: Open Heart Surgery;  Laterality: Right;   INTRAVASCULAR ULTRASOUND/IVUS N/A 11/19/2020   Procedure: Intravascular Ultrasound/IVUS;  Surgeon: Nelva Bush, MD;  Location: [place] [facility];  Service: Cardiovascular;  Laterality: N/A;   IR THORACENTESIS ASP PLEURAL SPACE W/IMG GUIDE  01/31/2022   LEFT HEART CATH AND CORONARY ANGIOGRAPHY N/A 11/19/2020   Procedure: LEFT HEART CATH AND CORONARY ANGIOGRAPHY;  Surgeon: Nelva Bush, MD;  Location: [place] [facility];  Service: Cardiovascular;  Laterality: N/A;   LEFT HEART CATH AND CORONARY ANGIOGRAPHY N/A 12/24/2020   Procedure: LEFT HEART CATH AND CORONARY ANGIOGRAPHY;  Surgeon: Martinique, Peter M, MD;  Location: [place] [facility];  Service: Cardiovascular;  Laterality: N/A;   LEFT HEART CATH AND CORONARY ANGIOGRAPHY N/A 12/16/2021   Procedure: LEFT HEART CATH AND CORONARY ANGIOGRAPHY;  Surgeon: Leonie Man, MD;  Location: [place] [facility];  Service: Cardiovascular;  Laterality: N/A;   LUMBAR LAMINECTOMY     SHOULDER ARTHROSCOPY WITH ROTATOR CUFF REPAIR AND SUBACROMIAL DECOMPRESSION Right 11/23/2018   Procedure: SHOULDER ARTHROSCOPY WITH ROTATOR CUFF REPAIR AND SUBACROMIAL DECOMPRESSION;  Surgeon: [name], MD;  Location: [place];  Service: Orthopedics;  Laterality: Right;   TEE WITHOUT CARDIOVERSION N/A 12/24/2021   Procedure: TRANSESOPHAGEAL ECHOCARDIOGRAM (TEE);  Surgeon: Lajuana Matte, MD;  Location: [place];  Service: Open Heart Surgery;  Laterality: N/A;     PHYSICAL EXAM:  VS: BP 108/80 (BP Location: Left Arm, Patient Position: Sitting)   Ht '5\' 11"'$  (1.803 m)   Wt  194 lb (88 kg)   BMI 27.06 kg/m  Physical Exam Gen: NAD, alert, cooperative with exam, well-appearing MSK:  Neurovascularly intact       ASSESSMENT & PLAN:   Cervical radiculopathy Acute on chronic in nature.  Symptoms been present for several years with changes appreciated at C5-6 and C6/7.  Previously had scheduled a two-level cervical fusion but has had significant cardiovascular problems in the interim. -Counseled on home exercise therapy and supportive care. -Pursue epidural.

## 2022-08-04 NOTE — Assessment & Plan Note (Signed)
Acute on chronic in nature.  Symptoms been present for several years with changes appreciated at C5-6 and C6/7.  Previously had scheduled a two-level cervical fusion but has had significant cardiovascular problems in the interim. -Counseled on home exercise therapy and supportive care. -Pursue epidural.

## 2022-08-06 ENCOUNTER — Ambulatory Visit: Payer: Medicare HMO | Admitting: Cardiology

## 2022-08-06 ENCOUNTER — Other Ambulatory Visit (HOSPITAL_BASED_OUTPATIENT_CLINIC_OR_DEPARTMENT_OTHER): Payer: Self-pay

## 2022-08-06 ENCOUNTER — Encounter: Payer: Self-pay | Admitting: Cardiology

## 2022-08-06 VITALS — BP 128/74 | HR 61 | Ht 71.0 in | Wt 195.8 lb

## 2022-08-06 DIAGNOSIS — E782 Mixed hyperlipidemia: Secondary | ICD-10-CM

## 2022-08-06 DIAGNOSIS — I10 Essential (primary) hypertension: Secondary | ICD-10-CM | POA: Diagnosis not present

## 2022-08-06 DIAGNOSIS — R0602 Shortness of breath: Secondary | ICD-10-CM

## 2022-08-06 DIAGNOSIS — I25118 Atherosclerotic heart disease of native coronary artery with other forms of angina pectoris: Secondary | ICD-10-CM

## 2022-08-06 MED ORDER — RANOLAZINE ER 1000 MG PO TB12
1000.0000 mg | ORAL_TABLET | Freq: Two times a day (BID) | ORAL | 3 refills | Status: DC
  Filled 2022-08-06: qty 180, 90d supply, fill #0

## 2022-08-06 NOTE — Progress Notes (Unsigned)
Cardiology Office Note:    Date:  08/06/2022   ID:  Ronald Petersen, DOB 04/14/1956, MRN [id number]  PCP:  [name], Alferd Apa, DO  Cardiologist:  Berniece Salines, DO  Electrophysiologist:  None   Referring MD: [name], Alferd Apa, *   " I am   History of Present Illness:    Ronald Petersen is a 66 y.o. 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     I saw the patient January 16, 2022 at that time he reported some shortness of breath I increase his Lasix to 40 mg daily.  Blood work.  I also gave him some lidocaine as he was experiencing muscle skeletal pain.   I saw the patient on February 18, 2022 at that time he was still short of breath I increase his Lasix to 40 mg twice a day.  He has titrated down the Lasix to every other day.  He is not sure if he is feeling any better on days that he takes the Lasix he is going to pay attention to his to see if there is any difference.  At his visit on May 14, 2022 he was experiencing midsternal chest discomfort and shortness of breath.  Got a plain x-ray which was normal because also concerned about musculoskeletal component of this.  He had never gotten a pulmonary function test I referred the patient to pulmonary.  I held off on increasing his Lasix.    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   Cancer [facility])    Complication of anesthesia    aspirated with back surgery at age 23   Depression    Diabetes mellitus Type  II    GERD (gastroesophageal reflux disease)    Hyperlipidemia    Hypertension    Neuromuscular disorder ([place])    NEUROPATHY   Right rotator cuff tear 11/23/2018   Sleep apnea    no CPAP    Past Surgical History:  Procedure Laterality Date   APPENDECTOMY     ARTHOSCOPIC ROTAOR CUFF REPAIR Right 11/23/2018   Procedure: ARTHROSCOPIC ROTATOR CUFF REPAIR;  Surgeon: [name], MD;  Location: [place];  Service: Orthopedics;  Laterality: Right;   CHOLECYSTECTOMY     CORONARY ARTERY BYPASS GRAFT N/A 12/24/2021   x2 LIMA to LAD; SVG to Obtuse Marginal   CORONARY STENT INTERVENTION N/A 11/19/2020   Procedure: CORONARY STENT INTERVENTION;  Surgeon: Nelva Bush, MD;  Location: [place] [facility];  Service: Cardiovascular;  Laterality: N/A;   ENDOVEIN HARVEST OF GREATER SAPHENOUS VEIN Right 12/24/2021   Procedure: ENDOVEIN HARVEST OF GREATER SAPHENOUS VEIN;  Surgeon: Lajuana Matte, MD;  Location: [place];  Service: Open Heart Surgery;  Laterality: Right;   INTRAVASCULAR ULTRASOUND/IVUS N/A 11/19/2020   Procedure: Intravascular Ultrasound/IVUS;  Surgeon: Nelva Bush, MD;  Location: [place] [facility];  Service:  Cardiovascular;  Laterality: N/A;   IR THORACENTESIS ASP PLEURAL SPACE W/IMG GUIDE  01/31/2022   LEFT HEART CATH AND CORONARY ANGIOGRAPHY N/A 11/19/2020   Procedure: LEFT HEART CATH AND CORONARY ANGIOGRAPHY;  Surgeon: Nelva Bush, MD;  Location: [place] [facility];  Service: Cardiovascular;  Laterality: N/A;   LEFT HEART CATH AND CORONARY ANGIOGRAPHY N/A 12/24/2020   Procedure: LEFT HEART CATH AND CORONARY ANGIOGRAPHY;  Surgeon: Martinique, Peter M, MD;  Location: [place] [facility];  Service: Cardiovascular;  Laterality: N/A;   LEFT HEART CATH AND CORONARY ANGIOGRAPHY N/A 12/16/2021   Procedure: LEFT HEART CATH AND CORONARY ANGIOGRAPHY;  Surgeon: Leonie Man, MD;  Location: [place] [facility];  Service: Cardiovascular;  Laterality: N/A;   LUMBAR  LAMINECTOMY     SHOULDER ARTHROSCOPY WITH ROTATOR CUFF REPAIR AND SUBACROMIAL DECOMPRESSION Right 11/23/2018   Procedure: SHOULDER ARTHROSCOPY WITH ROTATOR CUFF REPAIR AND SUBACROMIAL DECOMPRESSION;  Surgeon: [name], MD;  Location: [place];  Service: Orthopedics;  Laterality: Right;   TEE WITHOUT CARDIOVERSION N/A 12/24/2021   Procedure: TRANSESOPHAGEAL ECHOCARDIOGRAM (TEE);  Surgeon: Lajuana Matte, MD;  Location: [place];  Service: Open Heart Surgery;  Laterality: N/A;    Current Medications: Current Meds  Medication Sig   acetaminophen (TYLENOL) 650 MG CR tablet Take 650 mg by mouth every 8 (eight) hours as needed for pain.   aspirin EC 325 MG EC tablet Take 1 tablet (325 mg total) by mouth daily.   ELDERBERRY PO Take 300 mg by mouth daily.   famotidine (PEPCID) 20 MG tablet Take 1 tablet (20 mg total) by mouth daily.   fenofibrate 160 MG tablet TAKE 1 TABLET (160 MG TOTAL) BY MOUTH DAILY.   furosemide (LASIX) 40 MG tablet Take 1 tablet (40 mg total) by mouth daily. (Patient taking differently: Take 40 mg by mouth every other day.)   gabapentin (NEURONTIN) 800 MG tablet Take 1 tablet (800 mg total) by mouth 3 (three) times daily.   glucose blood (ONETOUCH VERIO) test strip USE AS INSTRUCTED TO CHECK BLOOD SUGAR ONCE A DAY   Magnesium Citrate 200 MG TABS Take 400 mg by mouth daily.   ondansetron (ZOFRAN) 4 MG tablet Take 1 tablet (4 mg total) by mouth every 8 (eight) hours as needed.   pantoprazole (PROTONIX) 40 MG tablet TAKE 1 TABLET (40 MG TOTAL) BY MOUTH TWICE A DAY   potassium chloride SA (KLOR-CON M) 20 MEQ tablet Take 1 tablet by mouth daily for three days then resume taking 1 tablet every other day   ranolazine (RANEXA) 500 MG 12 hr tablet Take 1 tablet (500 mg total) by mouth 2 (two) times daily.   sertraline (ZOLOFT) 100 MG tablet TAKE 1 & 1/2 TABLET BY MOUTH ONCE DAILY   tiZANidine (ZANAFLEX) 4 MG tablet Take 1 tablet (4 mg total) by mouth every 6  (six) hours as needed for muscle spasms.   traZODone (DESYREL) 50 MG tablet Take 1 tablet (50 mg total) by mouth at bedtime as needed for sleep.   vitamin E 180 MG (400 UNITS) capsule Take 1,600 Units by mouth daily.     Allergies:   Niacin   Social History   Socioeconomic History   Marital status: Married    Spouse name: Lorriane Shire   Number of children: 2   Years of education: Not on file   Highest education level: Not on file  Occupational History   Occupation: self employed    Employer: UNEMPLOYED  Tobacco Use  Smoking status: Former    Years: 16.00    Types: Cigarettes    Quit date: 12/29/1985    Years since quitting: 36.6   Smokeless tobacco: Never  Vaping Use   Vaping Use: Never used  Substance and Sexual Activity   Alcohol use: Not Currently   Drug use: No   Sexual activity: Yes    Partners: Female  Other Topics Concern   Not on file  Social History Narrative   Exercise-- 3 days   Pt has hs degree   Right handed   One story home   Drinks caffeine   Social Determinants of Health   Financial Resource Strain: Low Risk  (02/04/2022)   Overall Financial Resource Strain (CARDIA)    Difficulty of Paying Living Expenses: Not hard at all  Food Insecurity: No Food Insecurity (02/04/2022)   Hunger Vital Sign    Worried About Running Out of Food in the Last Year: Never true    Ran Out of Food in the Last Year: Never true  Transportation Needs: No Transportation Needs (02/04/2022)   PRAPARE - Hydrologist (Medical): No    Lack of Transportation (Non-Medical): No  Physical Activity: Sufficiently Active (02/04/2022)   Exercise Vital Sign    Days of Exercise per Week: 7 days    Minutes of Exercise per Session: 30 min  Stress: No Stress Concern Present (02/04/2022)   [place]    Feeling of Stress : Only a little  Social Connections: Moderately Integrated (02/04/2022)   Social Connection  and Isolation Panel [NHANES]    Frequency of Communication with Friends and Family: Three times a week    Frequency of Social Gatherings with Friends and Family: More than three times a week    Attends Religious Services: More than 4 times per year    Active Member of [employer] or Organizations: No    Attends Music therapist: Never    Marital Status: Married     Family History: The patient's family history includes Alcohol abuse in an other family member; Arthritis in an other family member; COPD in his mother; Coronary artery disease in an other family member; Depression in an other family member; Heart disease in his sister and sister; Hypertension in an other family member; Ovarian cancer in his sister and other family members; Stomach cancer in his maternal grandmother and sister; Stroke in his father. There is no history of Colon polyps or Colon cancer.  ROS:   Review of Systems  Constitution: Negative for decreased appetite, fever and weight gain.  HENT: Negative for congestion, ear discharge, hoarse voice and sore throat.   Eyes: Negative for discharge, redness, vision loss in right eye and visual halos.  Cardiovascular: Negative for chest pain, dyspnea on exertion, leg swelling, orthopnea and palpitations.  Respiratory: Negative for cough, hemoptysis, shortness of breath and snoring.   Endocrine: Negative for heat intolerance and polyphagia.  Hematologic/Lymphatic: Negative for bleeding problem. Does not bruise/bleed easily.  Skin: Negative for flushing, nail changes, rash and suspicious lesions.  Musculoskeletal: Negative for arthritis, joint pain, muscle cramps, myalgias, neck pain and stiffness.  Gastrointestinal: Negative for abdominal pain, bowel incontinence, diarrhea and excessive appetite.  Genitourinary: Negative for decreased libido, genital sores and incomplete emptying.  Neurological: Negative for brief paralysis, focal weakness, headaches and loss of balance.   Psychiatric/Behavioral: Negative for altered mental status, depression and suicidal ideas.  Allergic/Immunologic: Negative for HIV exposure  and persistent infections.    EKGs/Labs/Other Studies Reviewed:    The following studies were reviewed today:   EKG:  The ekg ordered today demonstrates   Recent Labs: 01/24/2022: B Natriuretic Peptide 84.1 05/14/2022: Magnesium 2.1 06/03/2022: ALT 13; BUN 21; Creatinine, Ser 0.92; Hemoglobin 14.0; Platelets 175.0; Potassium 4.3; Sodium 137; TSH 1.04  Recent Lipid Panel    Component Value Date/Time   CHOL 103 06/03/2022 1411   TRIG 61.0 06/03/2022 1411   TRIG 148 12/10/2006 1603   HDL 46.40 06/03/2022 1411   CHOLHDL 2 06/03/2022 1411   VLDL 12.2 06/03/2022 1411   LDLCALC 44 06/03/2022 1411   LDLCALC 96 09/06/2020 1138   LDLDIRECT 93.0 05/31/2020 0910    Physical Exam:    VS:  There were no vitals taken for this visit.    Wt Readings from Last 3 Encounters:  08/04/22 194 lb (88 kg)  06/03/22 194 lb 3.2 oz (88.1 kg)  05/14/22 194 lb (88 kg)     GEN: Well nourished, well developed in no acute distress HEENT: Normal NECK: No JVD; No carotid bruits LYMPHATICS: No lymphadenopathy CARDIAC: S1S2 noted,RRR, no murmurs, rubs, gallops RESPIRATORY:  Clear to auscultation without rales, wheezing or rhonchi  ABDOMEN: Soft, non-tender, non-distended, +bowel sounds, no guarding. EXTREMITIES: No edema, No cyanosis, no clubbing MUSCULOSKELETAL:  No deformity  SKIN: Warm and dry NEUROLOGIC:  Alert and oriented x 3, non-focal PSYCHIATRIC:  Normal affect, good insight  ASSESSMENT:    No diagnosis found. PLAN:     1.  The patient is in agreement with the above plan. The patient left the office in stable condition.  The patient will follow up in   Medication Adjustments/Labs and Tests Ordered: Current medicines are reviewed at length with the patient today.  Concerns regarding medicines are outlined above.  No orders of the defined types  were placed in this encounter.  No orders of the defined types were placed in this encounter.   There are no Patient Instructions on file for this visit.   Adopting a Healthy Lifestyle.  Know what a healthy weight is for you (roughly BMI <25) and aim to maintain this   Aim for 7+ servings of fruits and vegetables daily   65-80+ fluid ounces of water or unsweet tea for healthy kidneys   Limit to max 1 drink of alcohol per day; avoid smoking/tobacco   Limit animal fats in diet for cholesterol and heart health - choose grass fed whenever available   Avoid highly processed foods, and foods high in saturated/trans fats   Aim for low stress - take time to unwind and care for your mental health   Aim for 150 min of moderate intensity exercise weekly for heart health, and weights twice weekly for bone health   Aim for 7-9 hours of sleep daily   When it comes to diets, agreement about the perfect plan isnt easy to find, even among the experts. Experts at the [employer] developed an idea known as the Healthy Eating Plate. Just imagine a plate divided into logical, healthy portions.   The emphasis is on diet quality:   Load up on vegetables and fruits - one-half of your plate: Aim for color and variety, and remember that potatoes dont count.   Go for whole grains - one-quarter of your plate: Whole wheat, barley, wheat berries, quinoa, oats, brown rice, and foods made with them. If you want pasta, go with whole wheat pasta.   Protein  power - one-quarter of your plate: Fish, chicken, beans, and nuts are all healthy, versatile protein sources. Limit red meat.   The diet, however, does go beyond the plate, offering a few other suggestions.   Use healthy plant oils, such as olive, canola, soy, corn, sunflower and peanut. Check the labels, and avoid partially hydrogenated oil, which have unhealthy trans fats.   If youre thirsty, drink water. Coffee and tea are good in  moderation, but skip sugary drinks and limit milk and dairy products to one or two daily servings.   The type of carbohydrate in the diet is more important than the amount. Some sources of carbohydrates, such as vegetables, fruits, whole grains, and beans-are healthier than others.   Finally, stay active  Signed, Berniece Salines, DO  08/06/2022 9:43 AM    [place]

## 2022-08-06 NOTE — Patient Instructions (Addendum)
Medication Instructions:  Your physician has recommended you make the following change in your medication:  INCREASE: Ranexa 100 mg twice daily *If you need a refill on your cardiac medications before your next appointment, please call your pharmacy*   Lab Work: None If you have labs (blood work) drawn today and your tests are completely normal, you will receive your results only by: [place] (if you have MyChart) OR A paper copy in the mail If you have any lab test that is abnormal or we need to change your treatment, we will call you to review the results.   Testing/Procedures: None   Follow-Up: At [facility], you and your health needs are our priority.  As part of our continuing mission to provide you with exceptional heart care, we have created designated Provider Care Teams.  These Care Teams include your primary Cardiologist (physician) and Advanced Practice Providers (APPs -  Physician Assistants and Nurse Practitioners) who all work together to provide you with the care you need, when you need it.  We recommend signing up for the patient portal called "MyChart".  Sign up information is provided on this After Visit Summary.  MyChart is used to connect with patients for Virtual Visits (Telemedicine).  Patients are able to view lab/test results, encounter notes, upcoming appointments, etc.  Non-urgent messages can be sent to your provider as well.   To learn more about what you can do with MyChart, go to [email or web address].    Your next appointment:   6 month(s)  The format for your next appointment:   In Person  Provider:   Berniece Salines, DO     Other Instructions   Important Information About Sugar

## 2022-08-07 ENCOUNTER — Ambulatory Visit
Admission: RE | Admit: 2022-08-07 | Discharge: 2022-08-07 | Disposition: A | Discharge status: Home / Self Care | Payer: Medicare HMO | Source: Ambulatory Visit | Attending: Family Medicine | Admitting: Family Medicine

## 2022-08-07 ENCOUNTER — Other Ambulatory Visit (HOSPITAL_BASED_OUTPATIENT_CLINIC_OR_DEPARTMENT_OTHER): Payer: Self-pay

## 2022-08-07 DIAGNOSIS — M4722 Other spondylosis with radiculopathy, cervical region: Secondary | ICD-10-CM | POA: Diagnosis not present

## 2022-08-07 DIAGNOSIS — M5412 Radiculopathy, cervical region: Secondary | ICD-10-CM

## 2022-08-07 MED ORDER — TRIAMCINOLONE ACETONIDE 40 MG/ML IJ SUSP (RADIOLOGY)
60.0000 mg | Freq: Once | INTRAMUSCULAR | Status: AC
  Administered 2022-08-07: 60 mg via EPIDURAL

## 2022-08-07 MED ORDER — IOPAMIDOL (ISOVUE-M 300) INJECTION 61%
1.0000 mL | Freq: Once | INTRAMUSCULAR | Status: AC | PRN
Start: 2022-08-07 — End: 2022-08-07
  Administered 2022-08-07: 1 mL via EPIDURAL

## 2022-08-07 NOTE — Discharge Instructions (Signed)

## 2022-08-15 ENCOUNTER — Other Ambulatory Visit (HOSPITAL_BASED_OUTPATIENT_CLINIC_OR_DEPARTMENT_OTHER): Payer: Self-pay

## 2022-08-15 ENCOUNTER — Encounter: Payer: Self-pay | Admitting: Family Medicine

## 2022-08-15 ENCOUNTER — Ambulatory Visit (INDEPENDENT_AMBULATORY_CARE_PROVIDER_SITE_OTHER): Payer: Medicare HMO | Admitting: Family Medicine

## 2022-08-15 VITALS — BP 104/70 | HR 67 | Temp 98.7°F | Resp 18 | Ht 71.0 in | Wt 194.6 lb

## 2022-08-15 DIAGNOSIS — D229 Melanocytic nevi, unspecified: Secondary | ICD-10-CM | POA: Diagnosis not present

## 2022-08-15 DIAGNOSIS — M5136 Other intervertebral disc degeneration, lumbar region: Secondary | ICD-10-CM

## 2022-08-15 DIAGNOSIS — M51369 Other intervertebral disc degeneration, lumbar region without mention of lumbar back pain or lower extremity pain: Secondary | ICD-10-CM | POA: Insufficient documentation

## 2022-08-15 NOTE — Patient Instructions (Signed)
Seborrheic Keratosis A seborrheic keratosis is a common, noncancerous (benign) skin growth. These growths are velvety, waxy, or rough spots that appear on the skin. They are often tan, brown, or black. The skin growths can be flat or raised and may be scaly. What are the causes? The cause of this condition is not known. What increases the risk? You are more likely to develop this condition if you: Have a family history of seborrheic keratosis. Are 67 years old or older. Are pregnant. Have had estrogen replacement therapy. What are the signs or symptoms? Symptoms of this condition include growths on the face, chest, shoulders, back, or other areas. These growths: Are usually painless, but may become irritated and itchy. Can be tan, yellow, brown, black, or other colors. Are slightly raised or have a flat surface. Are sometimes rough or wart-like in texture. Are often velvety or waxy on the surface. Are round or oval-shaped. Often occur in groups, but may occur as a single growth. How is this diagnosed? This condition is diagnosed with a medical history and physical exam. A sample of the growth may be tested (skin biopsy). You may also need to see a skin specialist (dermatologist). How is this treated? Treatment is not usually needed for this condition unless the growths are irritated or bleed often. You may also choose to have the growths removed if you do not like their appearance. Growth removal may include a procedure in which: Liquid nitrogen is applied to "freeze" off the growth (cryosurgery). This is the most common procedure. The growth is burned off with electricity (electrocautery). The growth is removed by scraping (curettage). Follow these instructions at home: Watch your growth or growths for any changes. Do not scratch or pick at the growth or growths. This can cause them to become irritated or infected. Contact a health care provider if: You suddenly have many new  growths. Your growth bleeds, itches, or hurts. Your growth suddenly becomes larger or changes color. Summary A seborrheic keratosis is a common, noncancerous skin growth. Treatment is not usually needed for this condition unless the growths are irritated or bleed often. Watch your growth or growths for any changes. Contact a health care provider if you suddenly have many new growths or your growth suddenly becomes larger or changes color. This information is not intended to replace advice given to you by your health care provider. Make sure you discuss any questions you have with your health care provider. Document Revised: 02/28/2022 Document Reviewed: 02/28/2022 Elsevier Patient Education  2023 Elsevier Inc.  

## 2022-08-15 NOTE — Assessment & Plan Note (Signed)
Refer to sport med

## 2022-08-15 NOTE — Assessment & Plan Note (Signed)
Refer to derm for skin check 

## 2022-08-15 NOTE — Progress Notes (Signed)
Established Patient Office Visit  Subjective   Patient ID: Ronald Petersen, male    DOB: 10/06/1956  Age: 66 y.o. MRN: [id number]  Chief Complaint  Patient presents with  . Skin Problem    Pt states having spots on face and head and have been there for years. Pt reports pain.     HPI  Patient Active Problem List   Diagnosis Date Noted  . Suspicious nevus 08/15/2022  . Bulge of lumbar disc without myelopathy 08/15/2022  . Cervical radiculopathy 08/04/2022  . Nocturia 06/03/2022  . Atypical chest pain 01/25/2022  . Pleural effusion 01/24/2022  . S/P CABG x 2 12/24/2021  . Coronary artery disease involving native coronary artery of native heart with unstable angina pectoris ([place]) 12/16/2021  . Chronic pain syndrome 06/07/2021  . Mild neurocognitive disorder due to multiple etiologies 05/10/2021  . Type II diabetes mellitus with hypoglycemia ([place]) 01/22/2021  . Type 2 diabetes mellitus with diabetic polyneuropathy, without long-term current use of insulin ([place]) 01/22/2021  . Metabolic syndrome 37/16/9678  . Coronary artery disease 01/17/2021  . Anxiety   . Arthritis   . Cancer ([place])   . Complication of anesthesia   . GERD (gastroesophageal reflux disease)   . Hyperlipidemia   . Hypertension   . Neuromuscular disorder ([place])   . Sleep apnea   . Coronary artery disease involving native coronary artery of native heart with angina pectoris ([place]) 11/20/2020  . CAD S/P DES PCI LAD & LCx-OM 11/20/2020  . Unstable angina ([place]) 11/19/2020  . Abnormal cardiac CT angiography 11/19/2020  . Shortness of breath 10/02/2020  . Dizziness 10/02/2020  . Chest pain 10/02/2020  . Obesity (BMI 30-39.9) 10/02/2020  . Type 2 diabetes mellitus with diabetic neuropathy, without long-term current use of insulin ([place]) 06/06/2020  . Uncontrolled type 2 diabetes mellitus with hyperglycemia ([place]) 05/31/2020  . Blurry vision 02/07/2020  . Spinal stenosis of lumbar region 02/07/2020  . Frequent falls  02/07/2020  . Foraminal stenosis of cervical region 07/26/2019  . Aortic atherosclerosis ([place]) 05/27/2019  . Chronic bilateral low back pain without sciatica 05/20/2019  . Carpal tunnel syndrome, bilateral 05/20/2019  . Weakness of both lower extremities 01/05/2019  . Falling episodes 01/05/2019  . Right rotator cuff tear 11/23/2018  . Neck pain 11/04/2018  . Acute pain of right shoulder 11/04/2018  . Gastroesophageal reflux disease 11/04/2018  . Hyperlipidemia associated with type 2 diabetes mellitus ([place]) 11/04/2018  . 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 Essential hypertension 09/14/2017  . Preventative health care 06/12/2016  . OSA (obstructive sleep apnea) 04/07/2016  . Joint pain 04/05/2016  . NSAID long-term use 12/13/2015  . Nausea without vomiting 12/13/2015  . History of colonic polyps 12/13/2015  . Weight loss 12/13/2015  . Depression 11/06/2015  . Metatarsal deformity 02/20/2015  . Equinus deformity of foot, acquired 02/20/2015  . Plantar fasciitis, bilateral 02/15/2015  . Left wrist injury 06/15/2014  . Peripheral neuropathy 04/01/2012  . Fatigue 11/05/2010  . OTHER SPECIFIED DISORDER OF PENIS 03/26/2010  . NAUSEA 03/26/2010  . Pain in limb 10/17/2008  . NECK PAIN, CHRONIC 11/24/2007  . Diabetes mellitus, type II ([place]) 01/27/2007  . Hyperlipidemia LDL goal <70 01/27/2007   Past Medical History:  Diagnosis Date  . Anxiety   . Arthritis   . CAD (coronary artery disease)    s/p CABG x 2 in Dec '22  . Cancer ([place])   . Complication of anesthesia  aspirated with back surgery at age 83  . Depression   . Diabetes mellitus Type II   . GERD (gastroesophageal reflux disease)   . Hyperlipidemia   . Hypertension   . Neuromuscular disorder ([place])    NEUROPATHY  . Right rotator cuff tear 11/23/2018  . Sleep apnea    no CPAP   Past Surgical History:  Procedure Laterality Date  . APPENDECTOMY    . ARTHOSCOPIC ROTAOR  CUFF REPAIR Right 11/23/2018   Procedure: ARTHROSCOPIC ROTATOR CUFF REPAIR;  Surgeon: [name], MD;  Location: [facility];  Service: Orthopedics;  Laterality: Right;  . CHOLECYSTECTOMY    . CORONARY ARTERY BYPASS GRAFT N/A 12/24/2021   x2 LIMA to LAD; SVG to Obtuse Marginal  . CORONARY STENT INTERVENTION N/A 11/19/2020   Procedure: CORONARY STENT INTERVENTION;  Surgeon: Nelva Bush, MD;  Location: [facility];  Service: Cardiovascular;  Laterality: N/A;  . ENDOVEIN HARVEST OF GREATER SAPHENOUS VEIN Right 12/24/2021   Procedure: ENDOVEIN HARVEST OF GREATER SAPHENOUS VEIN;  Surgeon: Lajuana Matte, MD;  Location: [facility];  Service: Open Heart Surgery;  Laterality: Right;  . INTRAVASCULAR ULTRASOUND/IVUS N/A 11/19/2020   Procedure: Intravascular Ultrasound/IVUS;  Surgeon: Nelva Bush, MD;  Location: [facility];  Service: Cardiovascular;  Laterality: N/A;  . IR THORACENTESIS ASP PLEURAL SPACE W/IMG GUIDE  01/31/2022  . LEFT HEART CATH AND CORONARY ANGIOGRAPHY N/A 11/19/2020   Procedure: LEFT HEART CATH AND CORONARY ANGIOGRAPHY;  Surgeon: Nelva Bush, MD;  Location: [facility];  Service: Cardiovascular;  Laterality: N/A;  . LEFT HEART CATH AND CORONARY ANGIOGRAPHY N/A 12/24/2020   Procedure: LEFT HEART CATH AND CORONARY ANGIOGRAPHY;  Surgeon: Martinique, Peter M, MD;  Location: [facility];  Service: Cardiovascular;  Laterality: N/A;  . LEFT HEART CATH AND CORONARY ANGIOGRAPHY N/A 12/16/2021   Procedure: LEFT HEART CATH AND CORONARY ANGIOGRAPHY;  Surgeon: Leonie Man, MD;  Location: [facility];  Service: Cardiovascular;  Laterality: N/A;  . LUMBAR LAMINECTOMY    . SHOULDER ARTHROSCOPY WITH ROTATOR CUFF REPAIR AND SUBACROMIAL DECOMPRESSION Right 11/23/2018   Procedure: SHOULDER ARTHROSCOPY WITH ROTATOR CUFF REPAIR AND SUBACROMIAL DECOMPRESSION;  Surgeon: [name], MD;  Location: [facility];  Service:  Orthopedics;  Laterality: Right;  . TEE WITHOUT CARDIOVERSION N/A 12/24/2021   Procedure: TRANSESOPHAGEAL ECHOCARDIOGRAM (TEE);  Surgeon: Lajuana Matte, MD;  Location: [facility];  Service: Open Heart Surgery;  Laterality: N/A;   Social History   Tobacco Use  . Smoking status: Former    Years: 16.00    Types: Cigarettes    Quit date: 12/29/1985    Years since quitting: 36.6  . Smokeless tobacco: Never  Vaping Use  . Vaping Use: Never used  Substance Use Topics  . Alcohol use: Not Currently  . Drug use: No   Social History   Socioeconomic History  . Marital status: Married    Spouse name: Lorriane Shire  . Number of children: 2  . Years of education: Not on file  . Highest education level: Not on file  Occupational History  . Occupation: self employed    Fish farm manager: UNEMPLOYED  Tobacco Use  . Smoking status: Former    Years: 16.00    Types: Cigarettes    Quit date: 12/29/1985    Years since quitting: 36.6  . Smokeless tobacco: Never  Vaping Use  . Vaping Use: Never used  Substance and Sexual Activity  . Alcohol use: Not Currently  .  Drug use: No  . Sexual activity: Yes    Partners: Female  Other Topics Concern  . Not on file  Social History Narrative   Exercise-- 3 days   Pt has hs degree   Right handed   One story home   Drinks caffeine   Social Determinants of Health   Financial Resource Strain: Low Risk  (02/04/2022)   Overall Financial Resource Strain (CARDIA)   . Difficulty of Paying Living Expenses: Not hard at all  Food Insecurity: No Food Insecurity (02/04/2022)   Hunger Vital Sign   . Worried About Charity fundraiser in the Last Year: Never true   . Ran Out of Food in the Last Year: Never true  Transportation Needs: No Transportation Needs (02/04/2022)   PRAPARE - Transportation   . Lack of Transportation (Medical): No   . Lack of Transportation (Non-Medical): No  Physical Activity: Sufficiently Active (02/04/2022)   Exercise Vital Sign   . Days of Exercise  per Week: 7 days   . Minutes of Exercise per Session: 30 min  Stress: No Stress Concern Present (02/04/2022)   [place]   . Feeling of Stress : Only a little  Social Connections: Moderately Integrated (02/04/2022)   Social Connection and Isolation Panel [NHANES]   . Frequency of Communication with Friends and Family: Three times a week   . Frequency of Social Gatherings with Friends and Family: More than three times a week   . Attends Religious Services: More than 4 times per year   . Active Member of Clubs or Organizations: No   . Attends Archivist Meetings: Never   . Marital Status: Married  Human resources officer Violence: Not At Risk (02/04/2022)   Humiliation, Afraid, Rape, and Kick questionnaire   . Fear of Current or Ex-Partner: No   . Emotionally Abused: No   . Physically Abused: No   . Sexually Abused: No   Family Status  Relation Name Status  . Mother  (Not Specified)  . Father  (Not Specified)  . Sister debbie Deceased at age 42  . Sister Production assistant, radio  . Sister [employer]  . [employer]  (Not Specified)  . Other  (Not Specified)  . Other  (Not Specified)  . Other  (Not Specified)  . Other  (Not Specified)  . Other  (Not Specified)  . Other Dawn (Not Specified)  . Other Dana (Not Specified)  . Other neg hx (Not Specified)  . Neg Hx  (Not Specified)   Family History  Problem Relation Age of Onset  . COPD Mother   . Stroke Father   . Ovarian cancer Sister   . Stomach cancer Sister   . Heart disease Sister        MI  . Heart disease Sister        MI  . Stomach cancer Maternal Grandmother   . Alcohol abuse Other   . Depression Other   . Arthritis Other   . Hypertension Other   . Coronary artery disease Other   . Ovarian cancer Other        neice  . Ovarian cancer Other        neice  . Colon polyps Neg Hx   . Colon cancer Neg Hx    Allergies  Allergen Reactions  . Niacin Anaphylaxis       Review of Systems  Constitutional:  Negative for fever and malaise/fatigue.  HENT:  Negative for congestion.   Eyes:  Negative for blurred vision.  Respiratory:  Negative for shortness of breath.   Cardiovascular:  Negative for chest pain, palpitations and leg swelling.  Gastrointestinal:  Negative for abdominal pain, blood in stool and nausea.  Genitourinary:  Negative for dysuria and frequency.  Musculoskeletal:  Positive for back pain. Negative for falls.  Skin:  Negative for rash.       Mult rough spots on scalp and face  Neurological:  Negative for dizziness, loss of consciousness and headaches.  Endo/Heme/Allergies:  Negative for environmental allergies.  Psychiatric/Behavioral:  Negative for depression. The patient is not nervous/anxious.      Objective:     BP 104/70 (BP Location: Left Arm, Patient Position: Sitting, Cuff Size: Normal)   Pulse 67   Temp 98.7 F (37.1 C) (Oral)   Resp 18   Ht 5' 11" (1.803 m)   Wt 194 lb 9.6 oz (88.3 kg)   SpO2 98%   BMI 27.14 kg/m  BP Readings from Last 3 Encounters:  08/15/22 104/70  08/07/22 137/77  08/06/22 128/74   Wt Readings from Last 3 Encounters:  08/15/22 194 lb 9.6 oz (88.3 kg)  08/06/22 195 lb 12.8 oz (88.8 kg)  08/04/22 194 lb (88 kg)   SpO2 Readings from Last 3 Encounters:  08/15/22 98%  08/06/22 98%  06/03/22 99%      Physical Exam   No results found for any visits on 08/15/22.  Last CBC Lab Results  Component Value Date   WBC 4.8 06/03/2022   HGB 14.0 06/03/2022   HCT 42.3 06/03/2022   MCV 80.0 06/03/2022   MCH 26.2 (L) 05/14/2022   RDW 17.2 (H) 06/03/2022   PLT 175.0 30/08/2329   Last metabolic panel Lab Results  Component Value Date   GLUCOSE 74 06/03/2022   NA 137 06/03/2022   K 4.3 06/03/2022   CL 100 06/03/2022   CO2 29 06/03/2022   BUN 21 06/03/2022   CREATININE 0.92 06/03/2022   EGFR 94 05/14/2022   CALCIUM 9.8 06/03/2022   PHOS 4.0 01/25/2022   PROT 7.1 06/03/2022   ALBUMIN  4.4 06/03/2022   LABGLOB 2.5 02/18/2022   AGRATIO 1.9 02/18/2022   BILITOT 0.8 06/03/2022   ALKPHOS 63 06/03/2022   AST 23 06/03/2022   ALT 13 06/03/2022   ANIONGAP 10 01/25/2022   Last lipids Lab Results  Component Value Date   CHOL 103 06/03/2022   HDL 46.40 06/03/2022   LDLCALC 44 06/03/2022   LDLDIRECT 93.0 05/31/2020   TRIG 61.0 06/03/2022   CHOLHDL 2 06/03/2022   Last hemoglobin A1c Lab Results  Component Value Date   HGBA1C 7.2 (H) 06/03/2022   Last thyroid functions Lab Results  Component Value Date   TSH 1.04 06/03/2022   Last vitamin D No results found for: "25OHVITD2", "25OHVITD3", "VD25OH" Last vitamin B12 and Folate Lab Results  Component Value Date   VITAMINB12 306 02/26/2021   FOLATE 10.9 11/05/2010      The ASCVD Risk score (Arnett DK, et al., 2019) failed to calculate for the following reasons:   The valid total cholesterol range is 130 to 320 mg/dL    Assessment & Plan:   Problem List Items Addressed This Visit       Unprioritized   Suspicious nevus - Primary    Refer to derm for skin check       Relevant Orders   Ambulatory referral to Dermatology   Bulge of lumbar disc  without myelopathy    Refer to sport med       Relevant Orders   Ambulatory referral to Sports Medicine    Return in about 4 months (around 12/15/2022), or if symptoms worsen or fail to improve, for f/u.    [name], DO

## 2022-08-19 ENCOUNTER — Other Ambulatory Visit: Payer: Self-pay | Admitting: Family Medicine

## 2022-08-19 ENCOUNTER — Ambulatory Visit: Payer: Medicare HMO | Admitting: Family Medicine

## 2022-08-19 ENCOUNTER — Encounter: Payer: Self-pay | Admitting: Family Medicine

## 2022-08-19 ENCOUNTER — Other Ambulatory Visit: Payer: Self-pay | Admitting: Podiatry

## 2022-08-19 VITALS — BP 100/72 | Ht 71.0 in | Wt 194.0 lb

## 2022-08-19 DIAGNOSIS — M5412 Radiculopathy, cervical region: Secondary | ICD-10-CM

## 2022-08-19 DIAGNOSIS — M5416 Radiculopathy, lumbar region: Secondary | ICD-10-CM | POA: Insufficient documentation

## 2022-08-19 DIAGNOSIS — R110 Nausea: Secondary | ICD-10-CM

## 2022-08-19 NOTE — Assessment & Plan Note (Signed)
Acute on chronic in nature. Having more pain in the lower extremities. Mri from 2020 is showing disc bulge.  - counseled on home exercise therapy and supportive care - can pursue epidural  - referral to physical therapy

## 2022-08-19 NOTE — Patient Instructions (Signed)
Good to see you Please try heat on the lower back  Please try the exercises  I have made a referral to physical therapy  Please send me a message in MyChart with any questions or updates.  Please see me back in 4 weeks.   --Dr. Raeford Razor

## 2022-08-19 NOTE — Assessment & Plan Note (Signed)
Has gotten improvement with recent epidural.  - counseled on home exercise therapy and supportive care - referral to physical therapy  - can repeat epidural.

## 2022-08-19 NOTE — Progress Notes (Signed)
Ronald Petersen - 66 y.o. male MRN [id number]  Date of birth: 17-Dec-1956  SUBJECTIVE:  Including CC & ROS.  No chief complaint on file.   Ronald Petersen is a 66 y.o. male that is  presenting with acute lower leg pain. Has tried an epidural for his cervical radicular pain which has helped.  Independent review of the MRI lumbar spine from 2020 shows mild disc bulging at L2-L3 and L4-L5  Review of Systems See HPI   HISTORY: Past Medical, Surgical, Social, and Family History Reviewed & Updated per EMR.   Pertinent Historical Findings include:  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   Cancer [facility])    Complication of anesthesia    aspirated with back surgery at age 6   Depression    Diabetes mellitus Type II    GERD (gastroesophageal reflux disease)    Hyperlipidemia    Hypertension    Neuromuscular disorder ([place])    NEUROPATHY   Right rotator cuff tear 11/23/2018   Sleep apnea    no CPAP    Past Surgical History:  Procedure Laterality Date   APPENDECTOMY     ARTHOSCOPIC ROTAOR CUFF REPAIR Right 11/23/2018   Procedure: ARTHROSCOPIC ROTATOR CUFF REPAIR;  Surgeon: [name], MD;  Location: [facility] [place];  Service: Orthopedics;  Laterality: Right;   CHOLECYSTECTOMY     CORONARY ARTERY BYPASS GRAFT N/A 12/24/2021   x2 LIMA to LAD; SVG to Obtuse Marginal   CORONARY STENT INTERVENTION N/A 11/19/2020   Procedure: CORONARY STENT INTERVENTION;  Surgeon: Nelva Bush, MD;  Location: [facility] [facility];  Service: Cardiovascular;  Laterality: N/A;   ENDOVEIN HARVEST OF GREATER SAPHENOUS VEIN Right 12/24/2021   Procedure: ENDOVEIN HARVEST OF GREATER SAPHENOUS VEIN;  Surgeon: Lajuana Matte, MD;  Location: [facility];  Service: Open Heart Surgery;  Laterality: Right;   INTRAVASCULAR ULTRASOUND/IVUS N/A 11/19/2020   Procedure: Intravascular Ultrasound/IVUS;  Surgeon: Nelva Bush, MD;  Location: [facility];  Service: Cardiovascular;  Laterality: N/A;   IR THORACENTESIS ASP PLEURAL SPACE W/IMG GUIDE  01/31/2022   LEFT HEART CATH AND CORONARY ANGIOGRAPHY N/A 11/19/2020   Procedure: LEFT HEART CATH AND CORONARY ANGIOGRAPHY;  Surgeon: Nelva Bush, MD;  Location: [facility];  Service: Cardiovascular;  Laterality: N/A;   LEFT HEART CATH AND CORONARY ANGIOGRAPHY N/A 12/24/2020   Procedure: LEFT HEART CATH AND CORONARY ANGIOGRAPHY;  Surgeon: Martinique, Peter M, MD;  Location: [facility];  Service: Cardiovascular;  Laterality: N/A;   LEFT HEART CATH AND CORONARY ANGIOGRAPHY N/A 12/16/2021   Procedure: LEFT HEART CATH AND CORONARY ANGIOGRAPHY;  Surgeon: Leonie Man, MD;  Location: [facility];  Service: Cardiovascular;  Laterality: N/A;   LUMBAR LAMINECTOMY     SHOULDER ARTHROSCOPY WITH ROTATOR CUFF REPAIR AND SUBACROMIAL DECOMPRESSION Right 11/23/2018   Procedure: SHOULDER ARTHROSCOPY WITH ROTATOR CUFF REPAIR AND SUBACROMIAL DECOMPRESSION;  Surgeon: [name], MD;  Location: [facility];  Service: Orthopedics;  Laterality: Right;   TEE WITHOUT CARDIOVERSION N/A 12/24/2021   Procedure: TRANSESOPHAGEAL ECHOCARDIOGRAM (TEE);  Surgeon: Lajuana Matte, MD;  Location: [facility];  Service: Open Heart Surgery;  Laterality: N/A;     PHYSICAL EXAM:  VS: BP 100/72 (BP Location: Left Arm, Patient Position: Sitting)   Ht '5\' 11"'$  (1.803 m)   Wt 194 lb (  88 kg)   BMI 27.06 kg/m  Physical Exam Gen: NAD, alert, cooperative with exam, well-appearing MSK:  Neurovascularly intact       ASSESSMENT & PLAN:   Lumbar radiculopathy Acute on chronic in nature. Having more pain in the lower extremities. Mri from 2020 is showing disc bulge.  - counseled on home exercise therapy and supportive care - can pursue epidural  - referral to physical therapy   Cervical radiculopathy Has gotten improvement with recent epidural.  -  counseled on home exercise therapy and supportive care - referral to physical therapy  - can repeat epidural.

## 2022-08-20 ENCOUNTER — Other Ambulatory Visit: Payer: Self-pay | Admitting: Podiatry

## 2022-08-20 ENCOUNTER — Other Ambulatory Visit (HOSPITAL_BASED_OUTPATIENT_CLINIC_OR_DEPARTMENT_OTHER): Payer: Self-pay

## 2022-08-20 MED ORDER — ONDANSETRON HCL 4 MG PO TABS
4.0000 mg | ORAL_TABLET | Freq: Three times a day (TID) | ORAL | 2 refills | Status: DC | PRN
  Filled 2022-08-20: qty 20, 7d supply, fill #0
  Filled 2022-09-14: qty 20, 7d supply, fill #1
  Filled 2022-10-17: qty 20, 7d supply, fill #2

## 2022-08-20 MED ORDER — GABAPENTIN 800 MG PO TABS
800.0000 mg | ORAL_TABLET | Freq: Three times a day (TID) | ORAL | 2 refills | Status: DC
  Filled 2022-08-20: qty 270, 90d supply, fill #0
  Filled 2022-11-23: qty 270, 90d supply, fill #1
  Filled 2023-02-10: qty 270, 90d supply, fill #2

## 2022-09-02 ENCOUNTER — Other Ambulatory Visit (HOSPITAL_BASED_OUTPATIENT_CLINIC_OR_DEPARTMENT_OTHER): Payer: Self-pay

## 2022-09-02 NOTE — Progress Notes (Signed)
Initial neurology clinic note  SERVICE DATE: 09/05/22 SERVICE TIME: 9:30 am  Reason for Evaluation: Consultation requested by Ronald Held, DO for an opinion regarding neuropathy. My final recommendations will be communicated back to the requesting physician by way of shared medical record or letter to requesting physician via [place] mail.  HPI: This is Ronald Petersen, a 66 y.o. right-handed male with a medical history of DM2, HTN, HLD, CAD s/p CABG (11/2021), back surgery (at age 61, cannot remember the lumbar levels), OA, anxiety, right rotator cuff tear, OSA (not on CPAP), and prior EtOH abuse who presents to neurology clinic with the chief complaint of neuropathy. The patient is accompanied by wife today.  Patient is having shooting pains in his feet shooting into his toes. His left leg is worse. He endorses numbness and tingling that extends to his calves. The tingling in his feet is near constant. He has arthritis in his hands and sometimes has tingling in his hands, but not as bad as legs. He still has significant back and neck pain (see previous history below). When he walks, he looks like a drunk person. He does not use an assistive device. He has not fallen in the last 3-4 months, but has lost balance and fallen this year (he thinks it is less than 5 times this year). He endorses weakness in the legs. He has difficulty going up and down stairs and feels like he trips over his own feet (?foot drop). He thinks most of the falls are related to not feeling his feet.  The patient denies symptoms suggestive of oculobulbar weakness including diplopia, ptosis, dysphagia, poor saliva control, dysarthria/dysphonia, impaired mastication, facial weakness/droop.  The patient does not report symptoms referable to autonomic dysfunction including impaired sweating, heat or cold intolerance, excessive mucosal dryness, gastroparetic early satiety, postprandial abdominal bloating, or  syncope/presyncope/orthostatic intolerance. He does endorse some bowel continence. He denies saddle anaesthesia.  The patient denies any constitutional symptoms like fever, night sweats, anorexia or unintentional weight loss.  EtOH use: Patient has not drank EtOH in 2 years. Previously was a heavier drinker (4-5 beers per day) Restrictive diet? No Family history of neuropathy/myopathy/NM disease? Sister had neuropathy (did not have diabetes)  Patient was seen by PCP, Ronald Petersen, on 06/03/22 and mentioned neuropathy, leg weakness, and falls. Lumbar xrays were ordered and patient was referred to neurology for further evaluation.  Previously patient has seen Ronald Petersen in the past (most recently on 02/26/21 for cognitive complaints and in 2020 for leg weakness). Per Ronald Petersen clinic note on 06/03/2019: Patient has history of chronic neck and back pain with history of lumbar laminectomy.  He is on multiple medications for chronic pain.  He reports radicular pain down the back of his legs when he walks.  Lumbar spine X-ray from 01/04/19 personally reviewed and demonstrated multilevel arthritic changes with moderate disc space narrowing at L4-5 and L5-S1 as well as facet arthritic changes at L3-4, L4-5 and L5-S1 bilaterally and anterior osteophytes at L4, L5, and S1.  He also reports worsening neck pain as well, radiating down both sides and into his shoulders.  Neck pain is followed by Ronald Petersen.  Due to chronic neck pain, cervical X-ray from 05/20/19 showed spondylosis most prominent at C5-6 and C6-7.  Over the past year, he reports worsening weakness and pain in the legs.  He also reports weakness in the upper extremities as well.  He has significant numbness in the legs as  well as in the hands radiating up the arms.  He was diagnosed with severe carpal tunnel syndrome.  Labs from February include B12 342 and low B1 of 7.  He has a history of alcohol dependence and reports that he was drinking about 6 beers  every other day up until 3 months ago.  Hgb A1c from 05/20/19 was elevated at 9.7.  About a month ago, he developed positional vertigo described as spinning sensation lasting seconds with change in head movement.  Per patient, he has not been cleared by cardiology for neck or back surgery.   Patient has never had an EMG.  Patient is currently on gabapentin 800 mg. He is prescribed TID, but usually takes it BID, sometimes TID (based on if he remembers). He is not sure if it is helping. Patient is currently on Zoloft for depression (per wife this works well). He is taking B12, but is not sure how much.  He does not remember previously tried medications and does not think he has ever tried topical treatment. He has previously been recommended for PT, but his co-pay and ability to pay has not allowed him to go.  Patient would like to know where the weakness is coming from and if there is anything that can help.   MEDICATIONS:  Outpatient Encounter Medications as of 09/05/2022  Medication Sig   acetaminophen (TYLENOL) 650 MG CR tablet Take 650 mg by mouth every 8 (eight) hours as needed for pain.   aspirin EC 325 MG EC tablet Take 1 tablet (325 mg total) by mouth daily.   ELDERBERRY PO Take 300 mg by mouth daily.   empagliflozin (JARDIANCE) 10 MG TABS tablet Take 10 mg by mouth daily.   famotidine (PEPCID) 20 MG tablet Take 1 tablet (20 mg total) by mouth daily.   fenofibrate 160 MG tablet TAKE 1 TABLET (160 MG TOTAL) BY MOUTH DAILY.   furosemide (LASIX) 40 MG tablet Take 1 tablet (40 mg total) by mouth daily. (Patient taking differently: Take 40 mg by mouth every other day.)   gabapentin (NEURONTIN) 800 MG tablet Take 1 tablet (800 mg total) by mouth 3 (three) times daily.   glucose blood (ONETOUCH VERIO) test strip USE AS INSTRUCTED TO CHECK BLOOD SUGAR ONCE A DAY   Magnesium Citrate 200 MG TABS Take 400 mg by mouth daily.   ondansetron (ZOFRAN) 4 MG tablet Take 1 tablet (4 mg total) by mouth  every 8 (eight) hours as needed.   pantoprazole (PROTONIX) 40 MG tablet TAKE 1 TABLET (40 MG TOTAL) BY MOUTH TWICE A DAY   potassium chloride SA (KLOR-CON M) 20 MEQ tablet Take 1 tablet by mouth daily for three days then resume taking 1 tablet every other day   ranolazine (RANEXA) 1000 MG SR tablet Take 1 tablet (1,000 mg total) by mouth 2 (two) times daily.   sertraline (ZOLOFT) 100 MG tablet TAKE 1 & 1/2 TABLET BY MOUTH ONCE DAILY   tiZANidine (ZANAFLEX) 4 MG tablet Take 1 tablet (4 mg total) by mouth every 6 (six) hours as needed for muscle spasms.   traZODone (DESYREL) 50 MG tablet Take 1 tablet (50 mg total) by mouth at bedtime as needed for sleep.   vitamin E 180 MG (400 UNITS) capsule Take 1,600 Units by mouth daily.   No facility-administered encounter medications on file as of 09/05/2022.    PAST MEDICAL HISTORY: Past Medical History:  Diagnosis Date   Anxiety    Arthritis    CAD (  coronary artery disease)    s/p CABG x 2 in Dec '22   Cancer [facility])    Complication of anesthesia    aspirated with back surgery at age 40   Depression    Diabetes mellitus Type II    GERD (gastroesophageal reflux disease)    Hyperlipidemia    Hypertension    Neuromuscular disorder ([employer])    NEUROPATHY   Right rotator cuff tear 11/23/2018   Sleep apnea    no CPAP    PAST SURGICAL HISTORY: Past Surgical History:  Procedure Laterality Date   APPENDECTOMY     ARTHOSCOPIC ROTAOR CUFF REPAIR Right 11/23/2018   Procedure: ARTHROSCOPIC ROTATOR CUFF REPAIR;  Surgeon: [name], MD;  Location: [place];  Service: Orthopedics;  Laterality: Right;   CHOLECYSTECTOMY     CORONARY ARTERY BYPASS GRAFT N/A 12/24/2021   x2 LIMA to LAD; SVG to Obtuse Marginal   CORONARY STENT INTERVENTION N/A 11/19/2020   Procedure: CORONARY STENT INTERVENTION;  Surgeon: Nelva Bush, MD;  Location: [place] [facility];  Service: Cardiovascular;  Laterality: N/A;   ENDOVEIN HARVEST OF GREATER  SAPHENOUS VEIN Right 12/24/2021   Procedure: ENDOVEIN HARVEST OF GREATER SAPHENOUS VEIN;  Surgeon: Lajuana Matte, MD;  Location: [place];  Service: Open Heart Surgery;  Laterality: Right;   INTRAVASCULAR ULTRASOUND/IVUS N/A 11/19/2020   Procedure: Intravascular Ultrasound/IVUS;  Surgeon: Nelva Bush, MD;  Location: [place] [facility];  Service: Cardiovascular;  Laterality: N/A;   IR THORACENTESIS ASP PLEURAL SPACE W/IMG GUIDE  01/31/2022   LEFT HEART CATH AND CORONARY ANGIOGRAPHY N/A 11/19/2020   Procedure: LEFT HEART CATH AND CORONARY ANGIOGRAPHY;  Surgeon: Nelva Bush, MD;  Location: [place] [facility];  Service: Cardiovascular;  Laterality: N/A;   LEFT HEART CATH AND CORONARY ANGIOGRAPHY N/A 12/24/2020   Procedure: LEFT HEART CATH AND CORONARY ANGIOGRAPHY;  Surgeon: Martinique, Peter M, MD;  Location: [place] [facility];  Service: Cardiovascular;  Laterality: N/A;   LEFT HEART CATH AND CORONARY ANGIOGRAPHY N/A 12/16/2021   Procedure: LEFT HEART CATH AND CORONARY ANGIOGRAPHY;  Surgeon: Leonie Man, MD;  Location: [place] [facility];  Service: Cardiovascular;  Laterality: N/A;   LUMBAR LAMINECTOMY     SHOULDER ARTHROSCOPY WITH ROTATOR CUFF REPAIR AND SUBACROMIAL DECOMPRESSION Right 11/23/2018   Procedure: SHOULDER ARTHROSCOPY WITH ROTATOR CUFF REPAIR AND SUBACROMIAL DECOMPRESSION;  Surgeon: [name], MD;  Location: [place];  Service: Orthopedics;  Laterality: Right;   TEE WITHOUT CARDIOVERSION N/A 12/24/2021   Procedure: TRANSESOPHAGEAL ECHOCARDIOGRAM (TEE);  Surgeon: Lajuana Matte, MD;  Location: [place];  Service: Open Heart Surgery;  Laterality: N/A;    ALLERGIES: Allergies  Allergen Reactions   Niacin Anaphylaxis    FAMILY HISTORY: Family History  Problem Relation Age of Onset   COPD Mother    Stroke Father    Ovarian cancer Sister    Stomach cancer Sister    Heart disease Sister        MI   Heart disease Sister        MI   Stomach  cancer Maternal Grandmother    Alcohol abuse Other    Depression Other    Arthritis Other    Hypertension Other    Coronary artery disease Other    Ovarian cancer Other        neice   Ovarian cancer Other        neice   Colon polyps Neg Hx    Colon cancer Neg  Hx     SOCIAL HISTORY: Social History   Tobacco Use   Smoking status: Former    Years: 16.00    Types: Cigarettes    Quit date: 12/29/1985    Years since quitting: 36.7   Smokeless tobacco: Never  Vaping Use   Vaping Use: Never used  Substance Use Topics   Alcohol use: Not Currently   Drug use: No   Social History   Social History Narrative   Exercise-- 3 days   Pt has hs degree   Right handed   One story home   Drinks caffeine     OBJECTIVE: PHYSICAL EXAM: BP 123/76   Pulse (!) 58   Ht '5\' 11"'$  (1.803 m)   Wt 192 lb 9.6 oz (87.4 kg)   SpO2 100%   BMI 26.86 kg/m   General: General appearance: Awake and alert. No distress. Cooperative with exam.  Skin: No obvious rash or jaundice. HEENT: Atraumatic. Anicteric. Lungs: Non-labored breathing on room air  Extremities: No edema. No obvious deformity.  Psych: Affect appropriate.  Neurological: Mental Status: Alert. Speech fluent. No pseudobulbar affect Cranial Nerves: CNII: No RAPD. Visual Grudzien intact. CNIII, IV, VI: PERRL. No nystagmus. EOMI. CN V: Facial sensation intact bilaterally to fine touch. [place] clench strong. CN VII: Facial muscles symmetric and strong. No ptosis at rest. CN VIII: Hears finger rub well bilaterally. CN IX: No hypophonia. CN X: Palate elevates symmetrically. CN XI: Full strength shoulder shrug bilaterally. CN XII: Tongue protrusion full and midline. No atrophy or fasciculations. No significant dysarthria Motor: Tone is normal. No fasciculations in extremities. No atrophy.  Individual muscle group testing (MRC grade out of 5):  Movement     Neck flexion 5    Neck extension 5     Right Left   Shoulder abduction  5- 5   Shoulder adduction 5 5   Shoulder ext rotation 5- 5   Shoulder int rotation 5 5   Elbow flexion 5 5   Elbow extension 5 5   Wrist extension 5 5   Wrist flexion 5 5   Finger abduction - FDI 5 5   Finger abduction - ADM 5 5   Finger extension 5 5   Finger distal flexion - 2/'3 5 5   '$ Finger distal flexion - 4/'5 5 5   '$ Thumb flexion - FPL 5 5   Thumb abduction - APB 5 5    Hip flexion 5- 5-   Hip extension 5 5   Hip adduction 5 5   Hip abduction 5 5   Knee extension 5 5   Knee flexion 5 5   Dorsiflexion 5 5   Plantarflexion 5 5   Great toe extension 4+ 4+   Great toe flexion 5- 5-     Reflexes:  Right Left   Bicep Trace Trace   Tricep 1+ 1+   BrRad 1+ 1+   Knee 2+ 2+   Ankle 0 0    Pathological Reflexes: Babinski: flexor response bilaterally Hoffman: absent bilaterally  Sensation: Pinprick: Intact in upper extremities. Diminished in bilateral lower extremities in a distal to proximal gradient. Vibration: Absent in bilateral great toes, felt stronger in ankles, fully present in knees bilaterally Proprioception: Diminished in right great toe, intact in left great toe. Coordination: Intact finger-to- nose-finger bilaterally. Romberg with mild sway Gait: Able to rise from chair with arms crossed unassisted. Mildly wide-based gait. Falls to right with tandem walk. Able to walk on toes and heels.  Lab and Test Review: Internal labs: Normal or unremarkable: TSH, lipid panel, CBC, CMP, HIV HbA1c (06/03/22): 7.2 B12 (02/26/21): 306 B1 (02/26/21): 13 Vit D (01/17/21): 42  Lumbar spine xray (06/03/22): FINDINGS: There is no evidence of lumbar spine fracture. Alignment is normal. There is moderate disc space narrowing at L4-L5 and L5-S1 with endplate osteophyte formation, similar to the prior study. Degenerative osteophytes are seen at the endplates at H4-T6, also unchanged. There are atherosclerotic calcifications of the aorta.   IMPRESSION: 1. No evidence for fracture or  malalignment. 2. Stable moderate degenerative changes.  MRI cervical spine (11/26/21): FINDINGS: Alignment: No significant listhesis.   Vertebrae: No fracture, evidence of discitis, or bone lesion. Discogenic endplate marrow changes most pronounced at C6-7.   Cord: Normal signal and morphology.   Posterior Fossa, vertebral arteries, paraspinal tissues: Negative.   Disc levels:   C2-C3: Unremarkable.   C3-C4: Small disc osteophyte complex, slightly eccentric to the left. Unremarkable facet joints. Moderate left foraminal stenosis. No canal stenosis. Unchanged.   C4-C5: Minimal disc osteophyte complex. Mild facet hypertrophy. Mild bilateral foraminal stenosis. No canal stenosis. Unchanged.   C5-C6: Disc osteophyte complex with left uncovertebral spurring. Minimal facet hypertrophy on the left. Severe left and mild-moderate right foraminal stenosis. No significant canal stenosis. Unchanged.   C6-C7: Disc osteophyte complex with bilateral uncovertebral spurring resulting in moderate to severe left and moderate right foraminal stenosis. No canal stenosis. Unchanged.   C7-T1: Unremarkable.   IMPRESSION: 1. No significant interval progression of multilevel cervical spondylosis, as described above. No significant canal stenosis at any level. 2. Severe left foraminal stenosis at C5-6 and moderate-to-severe left foraminal stenosis at C6-7.  MRI brain wo contrast (03/18/21): FINDINGS: Brain: Mild generalized atrophy with progression. Mild ventricular enlargement due to atrophy. Mild hyperintensity in the pons bilaterally likely due to chronic microvascular ischemia. Cerebral white matter normal. No hemorrhage or mass identified.   Vascular: Normal arterial flow voids.   Skull and upper cervical spine: No focal skeletal lesion identified.   Sinuses/Orbits: Mild mucosal edema paranasal sinuses. Negative orbit   Other: None   IMPRESSION: No acute abnormality   Mild  atrophy, progressive since 2016.  MRI lumbar spine (06/11/19): FINDINGS: Segmentation: The lowest lumbar type non-rib-bearing vertebra is labeled as L5.   Alignment:  No vertebral subluxation is observed.   Vertebrae: Type 2 degenerative endplate findings at L4-6 and to a lesser extent at L1-2 and L5-S1. Schmorl's node along the anterior inferior endplate of L1. Disc desiccation at L4-5 and L5-S1 with loss of disc height especially at L5-S1.   Conus medullaris and cauda equina: Conus extends to the L1 level. Conus and cauda equina appear normal.   Paraspinal and other soft tissues: A fluid signal intensity lesion of the left kidney is partially characterized on today's exam. This is statistically likely to be a cyst but not technically specific.   Disc levels:   L1-2: No impingement.  Diffuse disc bulge.   L2-3: Mild displacement of the right L2 nerve in the lateral extraforaminal space due to right lateral extraforaminal disc protrusion. Diffuse disc bulge.   L3-4: No impingement.  Diffuse disc bulge.   L4-5: Mild bilateral subarticular lateral recess stenosis due to disc bulge and mild left facet arthropathy.   L5-S1: Borderline bilateral foraminal stenosis due to intervertebral and facet spurring as well as disc bulge. Prior right laminectomy.   IMPRESSION: 1. Lumbar spondylosis and degenerative disc disease, causing mild impingement at L2-3 and L4-5 as detailed above.  ASSESSMENT: BATU CASSIN is a 66 y.o. male who presents for evaluation of numbness and tingling in legs and feet and gait imbalance. He has a relevant medical history of DM2, HTN, HLD, CAD s/p CABG (11/2021), back surgery (at age 23, cannot remember the lumbar levels), OA, anxiety, right rotator cuff tear, OSA (not on CPAP), and prior EtOH abuse. His neurological examination is pertinent for hyporeflexia and sensory loss in bilateral legs in a distal to proximal gradient. He also has gait imbalance  with wide base and positive Romberg. Available diagnostic data is significant for MRI brain with mild atrophy for age, MRI cervical spine with multilevel stenosis and severe foraminal stenosis, MRI lumbar spine with lumbar spondylosis. Previous labs have been significant for low B1, B12, and elevated HbA1c to 7.   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 Patient has some financial constraints, so work up was discussed and tailored to his situation. In addition, financial assistance information through [facility] was given. I have made the appropriate referrals, including PT that patient could not previously afford, understanding that all testing may not be complete. We will prioritize lab testing to look for other treatable causes of neuropathy.  PLAN: -Blood work: B12, MMA, B1, vit E, copper, IFE, SPEP -Patient to get in touch with me about how much B12 he is currently taking -Discussed DM mgmt and staying off EtOH to prevent worsening neuropathy -Referral to PT - Financial assistance info given -Discussed EMG - concerned about cost - will order in [facility] EMG. Patient will defer if cost prohibitive -Continue gabapentin 800 mg TID for now. May need to reduce if concern for ataxia or if another medication is desired. -Recommended topical Lidocaine cream over the counter for tingling  -Return to clinic in 3 months  The impression above as well as the plan as outlined below were extensively discussed with the patient (in the company of wife) who voiced understanding. All questions were answered to their satisfaction.  The patient was counseled on pertinent fall precautions per the printed material provided today, and as noted under the "Patient Instructions" section below.  When available, results of the above  investigations and possible further recommendations will be communicated to the patient via telephone/MyChart. Patient to call office if not contacted after expected testing turnaround time.   Total time spent reviewing records, interview, history/exam, documentation, and coordination of care on day of encounter:  80 min   Thank you for allowing me to participate in patient's care.  If I can answer any additional questions, I would be pleased to do so.  Ronald Levins, MD   CC: Ronald Petersen, Alferd Apa, DO [address] [place] [address]  CC: Referring provider: [name], DO [address] [place],  [place] [address]

## 2022-09-03 ENCOUNTER — Other Ambulatory Visit (HOSPITAL_BASED_OUTPATIENT_CLINIC_OR_DEPARTMENT_OTHER): Payer: Self-pay

## 2022-09-03 ENCOUNTER — Ambulatory Visit: Payer: Medicare HMO | Admitting: Physical Therapy

## 2022-09-03 ENCOUNTER — Other Ambulatory Visit: Payer: Self-pay | Admitting: Cardiology

## 2022-09-04 ENCOUNTER — Other Ambulatory Visit (HOSPITAL_BASED_OUTPATIENT_CLINIC_OR_DEPARTMENT_OTHER): Payer: Self-pay

## 2022-09-05 ENCOUNTER — Ambulatory Visit: Payer: Medicare HMO | Admitting: Neurology

## 2022-09-05 ENCOUNTER — Other Ambulatory Visit: Payer: Medicare HMO

## 2022-09-05 ENCOUNTER — Encounter: Payer: Self-pay | Admitting: Neurology

## 2022-09-05 VITALS — BP 123/76 | HR 58 | Ht 71.0 in | Wt 192.6 lb

## 2022-09-05 DIAGNOSIS — G621 Alcoholic polyneuropathy: Secondary | ICD-10-CM | POA: Diagnosis not present

## 2022-09-05 DIAGNOSIS — R269 Unspecified abnormalities of gait and mobility: Secondary | ICD-10-CM

## 2022-09-05 DIAGNOSIS — E538 Deficiency of other specified B group vitamins: Secondary | ICD-10-CM

## 2022-09-05 DIAGNOSIS — E560 Deficiency of vitamin E: Secondary | ICD-10-CM

## 2022-09-05 DIAGNOSIS — M47812 Spondylosis without myelopathy or radiculopathy, cervical region: Secondary | ICD-10-CM

## 2022-09-05 DIAGNOSIS — E519 Thiamine deficiency, unspecified: Secondary | ICD-10-CM

## 2022-09-05 DIAGNOSIS — E1142 Type 2 diabetes mellitus with diabetic polyneuropathy: Secondary | ICD-10-CM

## 2022-09-05 DIAGNOSIS — R209 Unspecified disturbances of skin sensation: Secondary | ICD-10-CM

## 2022-09-05 DIAGNOSIS — R69 Illness, unspecified: Secondary | ICD-10-CM | POA: Diagnosis not present

## 2022-09-05 DIAGNOSIS — M48061 Spinal stenosis, lumbar region without neurogenic claudication: Secondary | ICD-10-CM

## 2022-09-05 LAB — VITAMIN B12: Vitamin B-12: >1500 pg/mL — ABNORMAL HIGH (ref 211–911)

## 2022-09-05 NOTE — Patient Instructions (Signed)
I saw you today for neuropathy. It is likely a combination of your back, diabetes, and prior alcohol use that is causing your symptoms.  I would like to check some labs today to look for things I can treat to help your neuropathy.  I would also like to do a muscle and nerve test called an EMG and send you to physical therapy. I know cost may be an issue, but we will give you information about financial assistance through [facility] that may help. You should make sure you know your cost before going to the appointments. If you do not want to pay the costs for the EMG, then we can hold off for now.  Continue to work with your primary care doctor on your diabetes treatment. Continue to stay away from alcohol.  Continue your gabapentin.  You can also get lidocaine cream over the counter to rub on your legs or hands that tingle. This will help numb them. Wear gloves if you don't want your hands numb.  Look at how much B12 you are taking and please call or send a mychart message to let me know.  Follow up with me in 3 months.  The physicians and staff at [facility] are committed to providing excellent care. You may receive a survey requesting feedback about your experience at our office. We strive to receive "very good" responses to the survey questions. If you feel that your experience would prevent you from giving the office a "very good " response, please contact our office to try to remedy the situation. We may be reached at [phone]. Thank you for taking the time out of your busy day to complete the survey.  Kai Levins, MD [facility]  ELECTROMYOGRAM AND NERVE CONDUCTION STUDIES (EMG/NCS) INSTRUCTIONS  How to Prepare The neurologist conducting the EMG will need to know if you have certain medical conditions. Tell the neurologist and other EMG lab personnel if you: Have a pacemaker or any other electrical medical device Take blood-thinning medications Have hemophilia, a  blood-clotting disorder that causes prolonged bleeding Bathing Take a shower or bath shortly before your exam in order to remove oils from your skin. Don't apply lotions or creams before the exam.  What to Expect You'll likely be asked to change into a hospital gown for the procedure and lie down on an examination table. The following explanations can help you understand what will happen during the exam.  Electrodes. The neurologist or a technician places surface electrodes at various locations on your skin depending on where you're experiencing symptoms. Or the neurologist may insert needle electrodes at different sites depending on your symptoms.  Sensations. The electrodes will at times transmit a tiny electrical current that you may feel as a twinge or spasm. The needle electrode may cause discomfort or pain that usually ends shortly after the needle is removed. If you are concerned about discomfort or pain, you may want to talk to the neurologist about taking a short break during the exam.  Instructions. During the needle EMG, the neurologist will assess whether there is any spontaneous electrical activity when the muscle is at rest - activity that isn't present in healthy muscle tissue - and the degree of activity when you slightly contract the muscle.  He or she will give you instructions on resting and contracting a muscle at appropriate times. Depending on what muscles and nerves the neurologist is examining, he or she may ask you to change positions during the exam.  After your EMG You may experience some temporary, minor bruising where the needle electrode was inserted into your muscle. This bruising should fade within several days. If it persists, contact your primary care doctor.    Preventing Falls at [facility] are common, often dreaded events in the lives of older people. Aside from the obvious injuries and even death that may result, fall can cause wide-ranging consequences  including loss of independence, mental decline, decreased activity and mobility. Younger people are also at risk of falling, especially those with chronic illnesses and fatigue.  Ways to reduce risk for falling Examine diet and medications. Warm foods and alcohol dilate blood vessels, which can lead to dizziness when standing. Sleep aids, antidepressants and pain medications can also increase the likelihood of a fall.  Get a vision exam. Poor vision, cataracts and glaucoma increase the chances of falling.  Check foot gear. Shoes should fit snugly and have a sturdy, nonskid sole and a broad, low heel  Participate in a physician-approved exercise program to build and maintain muscle strength and improve balance and coordination. Programs that use ankle weights or stretch bands are excellent for muscle-strengthening. Water aerobics programs and low-impact Tai Chi programs have also been shown to improve balance and coordination.  Increase vitamin D intake. Vitamin D improves muscle strength and increases the amount of calcium the body is able to absorb and deposit in bones.  How to prevent falls from common hazards Floors - Remove all loose wires, cords, and throw rugs. Minimize clutter. Make sure rugs are anchored and smooth. Keep furniture in its usual place.  Chairs -- Use chairs with straight backs, armrests and firm seats. Add firm cushions to existing pieces to add height.  Bathroom - Install grab bars and non-skid tape in the tub or shower. Use a bathtub transfer bench or a shower chair with a back support Use an elevated toilet seat and/or safety rails to assist standing from a low surface. Do not use towel racks or bathroom tissue holders to help you stand.  Lighting - Make sure halls, stairways, and entrances are well-lit. Install a night light in your bathroom or hallway. Make sure there is a light switch at the top and bottom of the staircase. Turn lights on if you get up in the middle  of the night. Make sure lamps or light switches are within reach of the bed if you have to get up during the night.  Kitchen - Install non-skid rubber mats near the sink and stove. Clean spills immediately. Store frequently used utensils, pots, pans between waist and eye level. This helps prevent reaching and bending. Sit when getting things out of lower cupboards.  Living room/ Bedrooms - Place furniture with wide spaces in between, giving enough room to move around. Establish a route through the living room that gives you something to hold onto as you walk.  Stairs - Make sure treads, rails, and rugs are secure. Install a rail on both sides of the stairs. If stairs are a threat, it might be helpful to arrange most of your activities on the lower level to reduce the number of times you must climb the stairs.  Entrances and doorways - Install metal handles on the walls adjacent to the doorknobs of all doors to make it more secure as you travel through the doorway.  Tips for maintaining balance Keep at least one hand free at all times. Try using a backpack or fanny pack to hold things rather  than carrying them in your hands. Never carry objects in both hands when walking as this interferes with keeping your balance.  Attempt to swing both arms from front to back while walking. This might require a conscious effort if Parkinson's disease has diminished your movement. It will, however, help you to maintain balance and posture, and reduce fatigue.  Consciously lift your feet off of the ground when walking. Shuffling and dragging of the feet is a common culprit in losing your balance.  When trying to navigate turns, use a "U" technique of facing forward and making a wide turn, rather than pivoting sharply.  Try to stand with your feet shoulder-length apart. When your feet are close together for any length of time, you increase your risk of losing your balance and falling.  Do one thing at a time.  Don't try to walk and accomplish another task, such as reading or looking around. The decrease in your automatic reflexes complicates motor function, so the less distraction, the better.  Do not wear rubber or gripping soled shoes, they might "catch" on the floor and cause tripping.  Move slowly when changing positions. Use deliberate, concentrated movements and, if needed, use a grab bar or walking aid. Count 15 seconds between each movement. For example, when rising from a seated position, wait 15 seconds after standing to begin walking.  If balance is a continuous problem, you might want to consider a walking aid such as a cane, walking stick, or walker. Once you've mastered walking with help, you might be ready to try it on your own again.

## 2022-09-05 NOTE — Addendum Note (Signed)
Addended by: Jonnie Finner C on: 09/05/2022 01:38 PM   Modules accepted: Orders

## 2022-09-09 ENCOUNTER — Other Ambulatory Visit: Payer: Self-pay | Admitting: Family Medicine

## 2022-09-09 ENCOUNTER — Encounter: Payer: Self-pay | Admitting: Neurology

## 2022-09-09 LAB — PROTEIN ELECTROPHORESIS, SERUM
Albumin ELP: 4.1 g/dL (ref 3.8–4.8)
Alpha 1: 0.3 g/dL (ref 0.2–0.3)
Alpha 2: 0.8 g/dL (ref 0.5–0.9)
Beta 2: 0.4 g/dL (ref 0.2–0.5)
Beta Globulin: 0.5 g/dL (ref 0.4–0.6)
Gamma Globulin: 1.2 g/dL (ref 0.8–1.7)
Total Protein: 7.3 g/dL (ref 6.1–8.1)

## 2022-09-09 LAB — COPPER, SERUM: Copper: 129 ug/dL (ref 70–175)

## 2022-09-09 LAB — VITAMIN E
Gamma-Tocopherol (Vit E): <1 mg/L (ref <=4.3)
Vitamin E (Alpha Tocopherol): 20.7 mg/L — ABNORMAL HIGH (ref 5.7–19.9)

## 2022-09-09 LAB — VITAMIN B1: Vitamin B1 (Thiamine): 16 nmol/L (ref 8–30)

## 2022-09-09 LAB — IMMUNOFIXATION ELECTROPHORESIS
IgG (Immunoglobin G), Serum: 1301 mg/dL (ref 600–1540)
IgM, Serum: 126 mg/dL (ref 50–300)
Immunoglobulin A: 140 mg/dL (ref 70–320)

## 2022-09-09 LAB — METHYLMALONIC ACID, SERUM: Methylmalonic Acid, Quant: 155 nmol/L (ref 87–318)

## 2022-09-10 ENCOUNTER — Ambulatory Visit (INDEPENDENT_AMBULATORY_CARE_PROVIDER_SITE_OTHER): Payer: Medicare HMO

## 2022-09-10 ENCOUNTER — Other Ambulatory Visit (HOSPITAL_BASED_OUTPATIENT_CLINIC_OR_DEPARTMENT_OTHER): Payer: Self-pay

## 2022-09-10 ENCOUNTER — Ambulatory Visit: Payer: Medicare HMO | Admitting: Pulmonary Disease

## 2022-09-10 VITALS — BP 110/70 | HR 64 | Ht 71.0 in | Wt 195.8 lb

## 2022-09-10 DIAGNOSIS — J90 Pleural effusion, not elsewhere classified: Secondary | ICD-10-CM | POA: Diagnosis not present

## 2022-09-10 DIAGNOSIS — Z9889 Other specified postprocedural states: Secondary | ICD-10-CM | POA: Diagnosis not present

## 2022-09-10 DIAGNOSIS — Z951 Presence of aortocoronary bypass graft: Secondary | ICD-10-CM | POA: Diagnosis not present

## 2022-09-10 DIAGNOSIS — Z87891 Personal history of nicotine dependence: Secondary | ICD-10-CM | POA: Diagnosis not present

## 2022-09-10 MED ORDER — EMPAGLIFLOZIN 10 MG PO TABS
10.0000 mg | ORAL_TABLET | Freq: Every day | ORAL | 3 refills | Status: DC
  Filled 2022-09-10: qty 30, 30d supply, fill #0
  Filled 2022-10-10: qty 30, 30d supply, fill #1
  Filled 2022-11-09: qty 30, 30d supply, fill #2
  Filled 2022-12-08: qty 30, 30d supply, fill #3

## 2022-09-10 NOTE — Patient Instructions (Signed)
Thank you for visiting Dr. Valeta Harms at [facility]. Today we recommend the following:  Orders Placed This Encounter  Procedures   Pulmonary Function Test   Return After PFTs complete, for with APP or Dr. Valeta Harms.    Please do your part to reduce the spread of COVID-19.

## 2022-09-10 NOTE — Progress Notes (Signed)
Synopsis: Referred in September 2023 for shortness of breath by Berniece Salines, DO  Subjective:   PATIENT ID: Ronald Petersen GENDER: male DOB: [birth year], MRN: [id number]  Chief Complaint  Patient presents with   Consult    SOB, pain in chest.    This is a 66 year old gentleman, past medical history of arthritis, type 2 diabetes, gastroesophageal reflux, hyperlipidemia, hypertension, sleep apnea, not on CPAP.Patient was referred for evaluation of shortness of breath.  Patient last seen in the office by Dr. Harriet Masson on 08/06/2022 by cardiology.  7 history of CABG x2, LIMA to LAD SVG to OM December 2022.  He has chronic stable angina currently managed with ranolazine.  Referral placed for evaluation of shortness of breath to pulmonary.  He is a former smoker quit 1987.  He had pulmonary function tests ordered in May 2023 but they have not yet been completed.  Patient states that he still has some shortness of breath on occasion.  He never had his pulmonary function test complete.  I originally saw him back in February after his cardiac surgery with recurrent effusion.  He is also wondering whether or not his effusion could have returned.  He is a former smoker quit more than 30 years ago.  He also describes intermittent left-sided chest pains.  And he cannot tell whether or not it is the pain that radiates around from his back or from his neck.  This seems to be more intermittent and not associated with exertion.     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   Cancer [facility])    Complication of anesthesia    aspirated with back surgery at age 8   Depression    Diabetes mellitus Type II    GERD (gastroesophageal reflux disease)    Hyperlipidemia    Hypertension    Neuromuscular disorder ([place])    NEUROPATHY   Right rotator cuff tear 11/23/2018   Sleep apnea    no CPAP     Family History  Problem Relation Age  of Onset   COPD Mother    Stroke Father    Ovarian cancer Sister    Stomach cancer Sister    Heart disease Sister        MI   Heart disease Sister        MI   Stomach cancer Maternal Grandmother    Alcohol abuse Other    Depression Other    Arthritis Other    Hypertension Other    Coronary artery disease Other    Ovarian cancer Other        neice   Ovarian cancer Other        neice   Colon polyps Neg Hx    Colon cancer Neg Hx      Past Surgical History:  Procedure Laterality Date   APPENDECTOMY     ARTHOSCOPIC ROTAOR CUFF REPAIR Right 11/23/2018   Procedure: ARTHROSCOPIC ROTATOR CUFF REPAIR;  Surgeon: [name], MD;  Location: [place];  Service: Orthopedics;  Laterality: Right;   CHOLECYSTECTOMY     CORONARY ARTERY BYPASS GRAFT N/A 12/24/2021   x2 LIMA to LAD; SVG to Obtuse Marginal   CORONARY STENT INTERVENTION N/A 11/19/2020   Procedure: CORONARY STENT INTERVENTION;  Surgeon: Nelva Bush, MD;  Location: [place] [facility];  Service: Cardiovascular;  Laterality: N/A;  ENDOVEIN HARVEST OF GREATER SAPHENOUS VEIN Right 12/24/2021   Procedure: ENDOVEIN HARVEST OF GREATER SAPHENOUS VEIN;  Surgeon: Lajuana Matte, MD;  Location: [place];  Service: Open Heart Surgery;  Laterality: Right;   INTRAVASCULAR ULTRASOUND/IVUS N/A 11/19/2020   Procedure: Intravascular Ultrasound/IVUS;  Surgeon: Nelva Bush, MD;  Location: [place] [facility];  Service: Cardiovascular;  Laterality: N/A;   IR THORACENTESIS ASP PLEURAL SPACE W/IMG GUIDE  01/31/2022   LEFT HEART CATH AND CORONARY ANGIOGRAPHY N/A 11/19/2020   Procedure: LEFT HEART CATH AND CORONARY ANGIOGRAPHY;  Surgeon: Nelva Bush, MD;  Location: [place] [facility];  Service: Cardiovascular;  Laterality: N/A;   LEFT HEART CATH AND CORONARY ANGIOGRAPHY N/A 12/24/2020   Procedure: LEFT HEART CATH AND CORONARY ANGIOGRAPHY;  Surgeon: Martinique, Peter M, MD;  Location: [place] [facility];  Service:  Cardiovascular;  Laterality: N/A;   LEFT HEART CATH AND CORONARY ANGIOGRAPHY N/A 12/16/2021   Procedure: LEFT HEART CATH AND CORONARY ANGIOGRAPHY;  Surgeon: Leonie Man, MD;  Location: [place] [facility];  Service: Cardiovascular;  Laterality: N/A;   LUMBAR LAMINECTOMY     SHOULDER ARTHROSCOPY WITH ROTATOR CUFF REPAIR AND SUBACROMIAL DECOMPRESSION Right 11/23/2018   Procedure: SHOULDER ARTHROSCOPY WITH ROTATOR CUFF REPAIR AND SUBACROMIAL DECOMPRESSION;  Surgeon: [name], MD;  Location: [place];  Service: Orthopedics;  Laterality: Right;   TEE WITHOUT CARDIOVERSION N/A 12/24/2021   Procedure: TRANSESOPHAGEAL ECHOCARDIOGRAM (TEE);  Surgeon: Lajuana Matte, MD;  Location: [place];  Service: Open Heart Surgery;  Laterality: N/A;    Social History   Socioeconomic History   Marital status: Married    Spouse name: Lorriane Shire   Number of children: 2   Years of education: Not on file   Highest education level: Not on file  Occupational History   Occupation: self employed    Fish farm manager: UNEMPLOYED  Tobacco Use   Smoking status: Former    Years: 16.00    Types: Cigarettes    Quit date: 12/29/1985    Years since quitting: 36.7   Smokeless tobacco: Never  Vaping Use   Vaping Use: Never used  Substance and Sexual Activity   Alcohol use: Not Currently   Drug use: No   Sexual activity: Yes    Partners: Female  Other Topics Concern   Not on file  Social History Narrative   Exercise-- 3 days   Pt has hs degree   Right handed   One story home   Drinks caffeine   Social Determinants of Health   Financial Resource Strain: Low Risk  (02/04/2022)   Overall Financial Resource Strain (CARDIA)    Difficulty of Paying Living Expenses: Not hard at all  Food Insecurity: No Food Insecurity (02/04/2022)   Hunger Vital Sign    Worried About Running Out of Food in the Last Year: Never true    [place] in the Last Year: Never true  Transportation Needs: No  Transportation Needs (02/04/2022)   PRAPARE - Hydrologist (Medical): No    Lack of Transportation (Non-Medical): No  Physical Activity: Sufficiently Active (02/04/2022)   Exercise Vital Sign    Days of Exercise per Week: 7 days    Minutes of Exercise per Session: 30 min  Stress: No Stress Concern Present (02/04/2022)   [place]    Feeling of Stress : Only a little  Social Connections: Moderately Integrated (02/04/2022)   Social Connection and  Isolation Panel [NHANES]    Frequency of Communication with Friends and Family: Three times a week    Frequency of Social Gatherings with Friends and Family: More than three times a week    Attends Religious Services: More than 4 times per year    Active Member of [employer] or Organizations: No    Attends Archivist Meetings: Never    Marital Status: Married  Human resources officer Violence: Not At Risk (02/04/2022)   Humiliation, Afraid, Rape, and Kick questionnaire    Fear of Current or Ex-Partner: No    Emotionally Abused: No    Physically Abused: No    Sexually Abused: No     Allergies  Allergen Reactions   Niacin Anaphylaxis     Outpatient Medications Prior to Visit  Medication Sig Dispense Refill   acetaminophen (TYLENOL) 650 MG CR tablet Take 650 mg by mouth every 8 (eight) hours as needed for pain.     aspirin EC 325 MG EC tablet Take 1 tablet (325 mg total) by mouth daily.     ELDERBERRY PO Take 300 mg by mouth daily.     empagliflozin (JARDIANCE) 10 MG TABS tablet Take 1 tablet (10 mg total) by mouth daily. 30 tablet 3   famotidine (PEPCID) 20 MG tablet Take 1 tablet (20 mg total) by mouth daily. 30 tablet 2   fenofibrate 160 MG tablet TAKE 1 TABLET (160 MG TOTAL) BY MOUTH DAILY. 90 tablet 1   furosemide (LASIX) 40 MG tablet Take 1 tablet (40 mg total) by mouth daily. (Patient taking differently: Take 40 mg by mouth every other day.) 90 tablet  3   gabapentin (NEURONTIN) 800 MG tablet Take 1 tablet (800 mg total) by mouth 3 (three) times daily. 270 tablet 2   glucose blood (ONETOUCH VERIO) test strip USE AS INSTRUCTED TO CHECK BLOOD SUGAR ONCE A DAY 100 strip 12   Magnesium Citrate 200 MG TABS Take 400 mg by mouth daily.     ondansetron (ZOFRAN) 4 MG tablet Take 1 tablet (4 mg total) by mouth every 8 (eight) hours as needed. 20 tablet 2   pantoprazole (PROTONIX) 40 MG tablet TAKE 1 TABLET (40 MG TOTAL) BY MOUTH TWICE A DAY 60 tablet 3   potassium chloride SA (KLOR-CON M) 20 MEQ tablet Take 1 tablet by mouth daily for three days then resume taking 1 tablet every other day 90 tablet 3   ranolazine (RANEXA) 1000 MG SR tablet Take 1 tablet (1,000 mg total) by mouth 2 (two) times daily. 180 tablet 3   sertraline (ZOLOFT) 100 MG tablet TAKE 1 & 1/2 TABLET BY MOUTH ONCE DAILY 135 tablet 3   tiZANidine (ZANAFLEX) 4 MG tablet Take 1 tablet (4 mg total) by mouth every 6 (six) hours as needed for muscle spasms. 30 tablet 1   traZODone (DESYREL) 50 MG tablet Take 1 tablet (50 mg total) by mouth at bedtime as needed for sleep. 30 tablet 3   vitamin E 180 MG (400 UNITS) capsule Take 1,600 Units by mouth daily.     No facility-administered medications prior to visit.    Review of Systems  Constitutional:  Negative for chills, fever, malaise/fatigue and weight loss.  HENT:  Negative for hearing loss, sore throat and tinnitus.   Eyes:  Negative for blurred vision and double vision.  Respiratory:  Positive for shortness of breath. Negative for cough, hemoptysis, sputum production, wheezing and stridor.   Cardiovascular:  Positive for chest pain. Negative for palpitations,  orthopnea, leg swelling and PND.       Intermittent left-sided pains that wrap around from his back to sternum.  Gastrointestinal:  Negative for abdominal pain, constipation, diarrhea, heartburn, nausea and vomiting.  Genitourinary:  Negative for dysuria, hematuria and urgency.   Musculoskeletal:  Negative for joint pain and myalgias.  Skin:  Negative for itching and rash.  Neurological:  Negative for dizziness, tingling, weakness and headaches.  Endo/Heme/Allergies:  Negative for environmental allergies. Does not bruise/bleed easily.  Psychiatric/Behavioral:  Negative for depression. The patient is not nervous/anxious and does not have insomnia.   All other systems reviewed and are negative.    Objective:  Physical Exam Vitals reviewed.  Constitutional:      General: He is not in acute distress.    Appearance: He is well-developed.  HENT:     Head: Normocephalic and atraumatic.  Eyes:     General: No scleral icterus.    Conjunctiva/sclera: Conjunctivae normal.     Pupils: Pupils are equal, round, and reactive to light.  Neck:     Vascular: No JVD.     Trachea: No tracheal deviation.  Cardiovascular:     Rate and Rhythm: Normal rate and regular rhythm.     Heart sounds: Normal heart sounds. No murmur heard. Pulmonary:     Effort: Pulmonary effort is normal. No tachypnea, accessory muscle usage or respiratory distress.     Breath sounds: No stridor. No wheezing, rhonchi or rales.  Abdominal:     General: There is no distension.     Palpations: Abdomen is soft.     Tenderness: There is no abdominal tenderness.  Musculoskeletal:        General: No tenderness.     Cervical back: Neck supple.  Lymphadenopathy:     Cervical: No cervical adenopathy.  Skin:    General: Skin is warm and dry.     Capillary Refill: Capillary refill takes less than 2 seconds.     Findings: No rash.  Neurological:     Mental Status: He is alert and oriented to person, place, and time.  Psychiatric:        Behavior: Behavior normal.      Vitals:   09/10/22 1532  BP: 110/70  Pulse: 64  SpO2: 97%  Weight: 195 lb 12.8 oz (88.8 kg)  Height: '5\' 11"'$  (1.803 m)   97% on RA BMI Readings from Last 3 Encounters:  09/10/22 27.31 kg/m  09/05/22 26.86 kg/m  08/19/22  27.06 kg/m   Wt Readings from Last 3 Encounters:  09/10/22 195 lb 12.8 oz (88.8 kg)  09/05/22 192 lb 9.6 oz (87.4 kg)  08/19/22 194 lb (88 kg)     CBC    Component Value Date/Time   WBC 4.8 06/03/2022 1411   RBC 5.29 06/03/2022 1411   HGB 14.0 06/03/2022 1411   HGB 14.7 05/14/2022 1236   HCT 42.3 06/03/2022 1411   HCT 44.9 05/14/2022 1236   PLT 175.0 06/03/2022 1411   PLT 174 05/14/2022 1236   MCV 80.0 06/03/2022 1411   MCV 80 05/14/2022 1236   MCH 26.2 (L) 05/14/2022 1236   MCH 27.0 01/25/2022 0120   MCHC 33.0 06/03/2022 1411   RDW 17.2 (H) 06/03/2022 1411   RDW 16.3 (H) 05/14/2022 1236   LYMPHSABS 1.7 06/03/2022 1411   LYMPHSABS 1.7 05/14/2022 1236   MONOABS 0.4 06/03/2022 1411   EOSABS 0.1 06/03/2022 1411   EOSABS 0.2 05/14/2022 1236   BASOSABS 0.0 06/03/2022 1411  BASOSABS 0.1 05/14/2022 1236     Chest Imaging:  May 2023 chest x-ray: No recurrent effusion.  Lung Barros clear The patient's images have been independently reviewed by me.    Pulmonary Functions Testing Results:     No data to display          FeNO:   Pathology:   Echocardiogram:   Heart Catheterization:     Assessment & Plan:     ICD-10-CM   1. Former smoker  Z87.891 Pulmonary Function Test    2. Pleural effusion  J90 DG Chest 2 View    3. S/P thoracentesis  Z98.890     4. S/P CABG x 2  Z95.1       Discussion:  This is a 66 year old gentleman, complaints of shortness of breath.  This is coming on after he had his CABG.  He has had recurrent pleural effusions.  This has seemingly resolved on follow-up chest x-rays.  He has pain that wraps around from his back into his left chest, stops at the sternum.  This seems to be intermittent.  Plan: We will start with getting a two-view chest x-ray today. He does need full pulmonary function test. We will see if he has any limitations from a pulmonary standpoint.  He did smoke but he quit greater than 30 years ago. If there is no  limitation to his PFTs are not sure that his shortness of breath is coming from his lungs. Pending upon the chest x-ray if there is any parenchymal lung change we could always consider HRCT imaging of the chest or if he has abnormalities in his PFT that would suggest need for further imaging. If the pain continues to be an issue from his back wrapping around his chest that makes me wonder for dealing with something in his thoracic spine that could be causing the pain.  He may need imaging of his T-spine at some time.  We will hold off at the moment but if this continues to be a problem we will consider imaging of his thoracic spine. Return to clinic after pulmonary function test complete   Current Outpatient Medications:    acetaminophen (TYLENOL) 650 MG CR tablet, Take 650 mg by mouth every 8 (eight) hours as needed for pain., Disp: , Rfl:    aspirin EC 325 MG EC tablet, Take 1 tablet (325 mg total) by mouth daily., Disp: , Rfl:    ELDERBERRY PO, Take 300 mg by mouth daily., Disp: , Rfl:    empagliflozin (JARDIANCE) 10 MG TABS tablet, Take 1 tablet (10 mg total) by mouth daily., Disp: 30 tablet, Rfl: 3   famotidine (PEPCID) 20 MG tablet, Take 1 tablet (20 mg total) by mouth daily., Disp: 30 tablet, Rfl: 2   fenofibrate 160 MG tablet, TAKE 1 TABLET (160 MG TOTAL) BY MOUTH DAILY., Disp: 90 tablet, Rfl: 1   furosemide (LASIX) 40 MG tablet, Take 1 tablet (40 mg total) by mouth daily. (Patient taking differently: Take 40 mg by mouth every other day.), Disp: 90 tablet, Rfl: 3   gabapentin (NEURONTIN) 800 MG tablet, Take 1 tablet (800 mg total) by mouth 3 (three) times daily., Disp: 270 tablet, Rfl: 2   glucose blood (ONETOUCH VERIO) test strip, USE AS INSTRUCTED TO CHECK BLOOD SUGAR ONCE A DAY, Disp: 100 strip, Rfl: 12   Magnesium Citrate 200 MG TABS, Take 400 mg by mouth daily., Disp: , Rfl:    ondansetron (ZOFRAN) 4 MG tablet, Take 1 tablet (4  mg total) by mouth every 8 (eight) hours as needed., Disp:  20 tablet, Rfl: 2   pantoprazole (PROTONIX) 40 MG tablet, TAKE 1 TABLET (40 MG TOTAL) BY MOUTH TWICE A DAY, Disp: 60 tablet, Rfl: 3   potassium chloride SA (KLOR-CON M) 20 MEQ tablet, Take 1 tablet by mouth daily for three days then resume taking 1 tablet every other day, Disp: 90 tablet, Rfl: 3   ranolazine (RANEXA) 1000 MG SR tablet, Take 1 tablet (1,000 mg total) by mouth 2 (two) times daily., Disp: 180 tablet, Rfl: 3   sertraline (ZOLOFT) 100 MG tablet, TAKE 1 & 1/2 TABLET BY MOUTH ONCE DAILY, Disp: 135 tablet, Rfl: 3   tiZANidine (ZANAFLEX) 4 MG tablet, Take 1 tablet (4 mg total) by mouth every 6 (six) hours as needed for muscle spasms., Disp: 30 tablet, Rfl: 1   traZODone (DESYREL) 50 MG tablet, Take 1 tablet (50 mg total) by mouth at bedtime as needed for sleep., Disp: 30 tablet, Rfl: 3   vitamin E 180 MG (400 UNITS) capsule, Take 1,600 Units by mouth daily., Disp: , Rfl:     Garner Nash, DO [facility] 09/10/2022 4:00 PM

## 2022-09-11 DIAGNOSIS — L821 Other seborrheic keratosis: Secondary | ICD-10-CM | POA: Diagnosis not present

## 2022-09-11 DIAGNOSIS — L570 Actinic keratosis: Secondary | ICD-10-CM | POA: Diagnosis not present

## 2022-09-14 ENCOUNTER — Other Ambulatory Visit: Payer: Self-pay | Admitting: Family Medicine

## 2022-09-15 ENCOUNTER — Other Ambulatory Visit (HOSPITAL_BASED_OUTPATIENT_CLINIC_OR_DEPARTMENT_OTHER): Payer: Self-pay

## 2022-09-15 ENCOUNTER — Ambulatory Visit (INDEPENDENT_AMBULATORY_CARE_PROVIDER_SITE_OTHER): Payer: Medicare HMO | Admitting: Family Medicine

## 2022-09-15 ENCOUNTER — Encounter: Payer: Self-pay | Admitting: Family Medicine

## 2022-09-15 ENCOUNTER — Other Ambulatory Visit: Payer: Self-pay | Admitting: Family Medicine

## 2022-09-15 ENCOUNTER — Ambulatory Visit (HOSPITAL_BASED_OUTPATIENT_CLINIC_OR_DEPARTMENT_OTHER)
Admission: RE | Admit: 2022-09-15 | Discharge: 2022-09-15 | Disposition: A | Discharge status: Home / Self Care | Payer: Medicare HMO | Source: Ambulatory Visit | Attending: Family Medicine | Admitting: Family Medicine

## 2022-09-15 VITALS — BP 94/62 | Ht 71.0 in | Wt 195.0 lb

## 2022-09-15 DIAGNOSIS — M5416 Radiculopathy, lumbar region: Secondary | ICD-10-CM

## 2022-09-15 DIAGNOSIS — G8929 Other chronic pain: Secondary | ICD-10-CM

## 2022-09-15 DIAGNOSIS — M8588 Other specified disorders of bone density and structure, other site: Secondary | ICD-10-CM | POA: Diagnosis not present

## 2022-09-15 DIAGNOSIS — M545 Low back pain, unspecified: Secondary | ICD-10-CM | POA: Diagnosis not present

## 2022-09-15 MED ORDER — FAMOTIDINE 20 MG PO TABS
20.0000 mg | ORAL_TABLET | Freq: Every day | ORAL | 2 refills | Status: DC
  Filled 2022-09-15: qty 30, 30d supply, fill #0
  Filled 2022-10-17: qty 30, 30d supply, fill #1
  Filled 2022-11-23: qty 30, 30d supply, fill #2

## 2022-09-15 NOTE — Assessment & Plan Note (Signed)
Acute worsening.  Having more symptoms bilaterally.  Previous MRI was demonstrating multiple levels of impingement. -Counseled on home exercise therapy and supportive care. -Flexion and extension views of the lumbar spine. -Epidural.

## 2022-09-15 NOTE — Patient Instructions (Signed)
Good to see you Please use heat  I will call with the xray results.   I have placed an order for the epidural  Please send me a message in MyChart with any questions or updates.  Please call me one week after the lumbar epidural .   --Dr. Raeford Razor

## 2022-09-15 NOTE — Progress Notes (Signed)
Ronald Petersen - 66 y.o. male MRN [id number]  Date of birth: 05/02/1956  SUBJECTIVE:  Including CC & ROS.  No chief complaint on file.   Ronald Petersen is a 66 y.o. male that is presenting with acute low back pain and radicular pain.  Previous MRI from 2020 was demonstrating spondylosis and degenerative disc changes with impingement at L2-3 and L4-5.    Review of Systems See HPI   HISTORY: Past Medical, Surgical, Social, and Family History Reviewed & Updated per EMR.   Pertinent Historical Findings include:  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   Cancer [facility])    Complication of anesthesia    aspirated with back surgery at age 25   Depression    Diabetes mellitus Type II    GERD (gastroesophageal reflux disease)    Hyperlipidemia    Hypertension    Neuromuscular disorder ([place])    NEUROPATHY   Right rotator cuff tear 11/23/2018   Sleep apnea    no CPAP    Past Surgical History:  Procedure Laterality Date   APPENDECTOMY     ARTHOSCOPIC ROTAOR CUFF REPAIR Right 11/23/2018   Procedure: ARTHROSCOPIC ROTATOR CUFF REPAIR;  Surgeon: [name], MD;  Location: [place];  Service: Orthopedics;  Laterality: Right;   CHOLECYSTECTOMY     CORONARY ARTERY BYPASS GRAFT N/A 12/24/2021   x2 LIMA to LAD; SVG to Obtuse Marginal   CORONARY STENT INTERVENTION N/A 11/19/2020   Procedure: CORONARY STENT INTERVENTION;  Surgeon: Nelva Bush, MD;  Location: [place] [facility];  Service: Cardiovascular;  Laterality: N/A;   ENDOVEIN HARVEST OF GREATER SAPHENOUS VEIN Right 12/24/2021   Procedure: ENDOVEIN HARVEST OF GREATER SAPHENOUS VEIN;  Surgeon: Lajuana Matte, MD;  Location: [place];  Service: Open Heart Surgery;  Laterality: Right;   INTRAVASCULAR ULTRASOUND/IVUS N/A 11/19/2020   Procedure: Intravascular Ultrasound/IVUS;  Surgeon: Nelva Bush, MD;  Location: [place]  [facility];  Service: Cardiovascular;  Laterality: N/A;   IR THORACENTESIS ASP PLEURAL SPACE W/IMG GUIDE  01/31/2022   LEFT HEART CATH AND CORONARY ANGIOGRAPHY N/A 11/19/2020   Procedure: LEFT HEART CATH AND CORONARY ANGIOGRAPHY;  Surgeon: Nelva Bush, MD;  Location: [place] [facility];  Service: Cardiovascular;  Laterality: N/A;   LEFT HEART CATH AND CORONARY ANGIOGRAPHY N/A 12/24/2020   Procedure: LEFT HEART CATH AND CORONARY ANGIOGRAPHY;  Surgeon: Martinique, Peter M, MD;  Location: [place] [facility];  Service: Cardiovascular;  Laterality: N/A;   LEFT HEART CATH AND CORONARY ANGIOGRAPHY N/A 12/16/2021   Procedure: LEFT HEART CATH AND CORONARY ANGIOGRAPHY;  Surgeon: Leonie Man, MD;  Location: [place] [facility];  Service: Cardiovascular;  Laterality: N/A;   LUMBAR LAMINECTOMY     SHOULDER ARTHROSCOPY WITH ROTATOR CUFF REPAIR AND SUBACROMIAL DECOMPRESSION Right 11/23/2018   Procedure: SHOULDER ARTHROSCOPY WITH ROTATOR CUFF REPAIR AND SUBACROMIAL DECOMPRESSION;  Surgeon: [name], MD;  Location: [place];  Service: Orthopedics;  Laterality: Right;   TEE WITHOUT CARDIOVERSION N/A 12/24/2021   Procedure: TRANSESOPHAGEAL ECHOCARDIOGRAM (TEE);  Surgeon: Lajuana Matte, MD;  Location: [place];  Service: Open Heart Surgery;  Laterality: N/A;     PHYSICAL EXAM:  VS: BP 94/62 (BP Location: Left Arm, Patient Position: Sitting)   Ht '5\' 11"'$  (1.803 m)   Wt 195 lb (88.5 kg)   BMI 27.20 kg/m  Physical Exam Gen: NAD, alert, cooperative with exam, well-appearing MSK:  Neurovascularly intact       ASSESSMENT & PLAN:   Lumbar radiculopathy Acute worsening.  Having more symptoms bilaterally.  Previous MRI was demonstrating multiple levels of impingement. -Counseled on home exercise therapy and supportive care. -Flexion and extension views of the lumbar spine. -Epidural.

## 2022-09-16 ENCOUNTER — Other Ambulatory Visit (HOSPITAL_BASED_OUTPATIENT_CLINIC_OR_DEPARTMENT_OTHER): Payer: Self-pay

## 2022-09-16 ENCOUNTER — Ambulatory Visit: Payer: Medicare HMO | Admitting: Family Medicine

## 2022-09-16 MED ORDER — TIZANIDINE HCL 4 MG PO TABS
4.0000 mg | ORAL_TABLET | Freq: Four times a day (QID) | ORAL | 1 refills | Status: DC | PRN
  Filled 2022-09-16: qty 30, 8d supply, fill #0
  Filled 2022-10-10: qty 30, 8d supply, fill #1

## 2022-09-17 ENCOUNTER — Telehealth: Payer: Self-pay | Admitting: Family Medicine

## 2022-09-17 NOTE — Telephone Encounter (Signed)
Pt informed of below.  

## 2022-09-17 NOTE — Telephone Encounter (Signed)
Left VM for patient. If he calls back please have him speak with a nurse/CMA and inform that his xray is showing the previously observed degenerative changes in the lower lumbar spine. There was no instability observed on these images.   If any questions then please take the best time and phone number to call and I will try to call him back.   Rosemarie Ax, MD [facility] 09/17/2022, 11:38 AM

## 2022-09-18 ENCOUNTER — Ambulatory Visit: Payer: Medicare HMO | Admitting: Cardiology

## 2022-09-24 ENCOUNTER — Other Ambulatory Visit (HOSPITAL_BASED_OUTPATIENT_CLINIC_OR_DEPARTMENT_OTHER): Payer: Self-pay

## 2022-09-24 ENCOUNTER — Other Ambulatory Visit: Payer: Self-pay | Admitting: Family Medicine

## 2022-09-24 ENCOUNTER — Ambulatory Visit
Admission: RE | Admit: 2022-09-24 | Discharge: 2022-09-24 | Disposition: A | Discharge status: Home / Self Care | Payer: Medicare HMO | Source: Ambulatory Visit | Attending: Family Medicine | Admitting: Family Medicine

## 2022-09-24 DIAGNOSIS — M5416 Radiculopathy, lumbar region: Secondary | ICD-10-CM

## 2022-09-24 DIAGNOSIS — G4700 Insomnia, unspecified: Secondary | ICD-10-CM

## 2022-09-24 MED ORDER — IOPAMIDOL (ISOVUE-M 200) INJECTION 41%: 1.0000 mL | Freq: Once | INTRAMUSCULAR | Status: DC

## 2022-09-24 MED ORDER — METHYLPREDNISOLONE ACETATE 40 MG/ML INJ SUSP (RADIOLOG: 80.0000 mg | Freq: Once | INTRAMUSCULAR | Status: DC

## 2022-09-24 MED ORDER — TRAZODONE HCL 50 MG PO TABS
50.0000 mg | ORAL_TABLET | Freq: Every evening | ORAL | 3 refills | Status: DC | PRN
  Filled 2022-09-24: qty 30, 30d supply, fill #0
  Filled 2022-10-26: qty 30, 30d supply, fill #1
  Filled 2022-11-23: qty 30, 30d supply, fill #2
  Filled 2022-12-10 – 2022-12-25 (×3): qty 30, 30d supply, fill #3

## 2022-09-24 NOTE — Discharge Instructions (Signed)

## 2022-09-29 ENCOUNTER — Ambulatory Visit
Admission: RE | Admit: 2022-09-29 | Discharge: 2022-09-29 | Disposition: A | Discharge status: Home / Self Care | Payer: Medicare HMO | Source: Ambulatory Visit | Attending: Family Medicine | Admitting: Family Medicine

## 2022-09-29 DIAGNOSIS — M5416 Radiculopathy, lumbar region: Secondary | ICD-10-CM

## 2022-09-29 DIAGNOSIS — M4727 Other spondylosis with radiculopathy, lumbosacral region: Secondary | ICD-10-CM | POA: Diagnosis not present

## 2022-09-29 MED ORDER — IOPAMIDOL (ISOVUE-M 200) INJECTION 41%
1.0000 mL | Freq: Once | INTRAMUSCULAR | Status: AC
  Administered 2022-09-29: 1 mL via EPIDURAL

## 2022-09-29 MED ORDER — METHYLPREDNISOLONE ACETATE 40 MG/ML INJ SUSP (RADIOLOG
80.0000 mg | Freq: Once | INTRAMUSCULAR | Status: AC
  Administered 2022-09-29: 80 mg via EPIDURAL

## 2022-09-29 NOTE — Discharge Instructions (Signed)

## 2022-10-10 ENCOUNTER — Other Ambulatory Visit (HOSPITAL_BASED_OUTPATIENT_CLINIC_OR_DEPARTMENT_OTHER): Payer: Self-pay

## 2022-10-17 ENCOUNTER — Other Ambulatory Visit (HOSPITAL_BASED_OUTPATIENT_CLINIC_OR_DEPARTMENT_OTHER): Payer: Self-pay

## 2022-10-17 ENCOUNTER — Other Ambulatory Visit: Payer: Self-pay | Admitting: Family Medicine

## 2022-10-17 DIAGNOSIS — R110 Nausea: Secondary | ICD-10-CM

## 2022-10-17 MED ORDER — PANTOPRAZOLE SODIUM 40 MG PO TBEC
40.0000 mg | DELAYED_RELEASE_TABLET | Freq: Two times a day (BID) | ORAL | 3 refills | Status: DC
  Filled 2022-10-17: qty 60, 30d supply, fill #0
  Filled 2022-11-15: qty 60, 30d supply, fill #1
  Filled 2022-12-10: qty 60, 30d supply, fill #2
  Filled 2023-01-12: qty 60, 30d supply, fill #3

## 2022-10-20 ENCOUNTER — Other Ambulatory Visit (HOSPITAL_BASED_OUTPATIENT_CLINIC_OR_DEPARTMENT_OTHER): Payer: Self-pay

## 2022-10-20 ENCOUNTER — Ambulatory Visit (INDEPENDENT_AMBULATORY_CARE_PROVIDER_SITE_OTHER): Payer: Medicare HMO | Admitting: Pulmonary Disease

## 2022-10-20 DIAGNOSIS — Z87891 Personal history of nicotine dependence: Secondary | ICD-10-CM | POA: Diagnosis not present

## 2022-10-20 LAB — PULMONARY FUNCTION TEST
DL/VA % pred: 131 %
DL/VA: 5.33 ml/min/mmHg/L
DLCO cor % pred: 100 %
DLCO cor: 28.27 ml/min/mmHg
DLCO unc % pred: 100 %
DLCO unc: 28.27 ml/min/mmHg
FEF 25-75 Post: 1.85 L/sec
FEF 25-75 Pre: 1.66 L/sec
FEF2575-%Change-Post: 11 %
FEF2575-%Pred-Post: 65 %
FEF2575-%Pred-Pre: 59 %
FEV1-%Change-Post: 2 %
FEV1-%Pred-Post: 66 %
FEV1-%Pred-Pre: 65 %
FEV1-Post: 2.42 L
FEV1-Pre: 2.36 L
FEV1FVC-%Change-Post: 4 %
FEV1FVC-%Pred-Pre: 98 %
FEV6-%Change-Post: -1 %
FEV6-%Pred-Post: 68 %
FEV6-%Pred-Pre: 69 %
FEV6-Post: 3.16 L
FEV6-Pre: 3.21 L
FEV6FVC-%Change-Post: 0 %
FEV6FVC-%Pred-Post: 105 %
FEV6FVC-%Pred-Pre: 104 %
FVC-%Change-Post: -1 %
FVC-%Pred-Post: 64 %
FVC-%Pred-Pre: 66 %
FVC-Post: 3.16 L
FVC-Pre: 3.22 L
Post FEV1/FVC ratio: 76 %
Post FEV6/FVC ratio: 100 %
Pre FEV1/FVC ratio: 73 %
Pre FEV6/FVC Ratio: 99 %
RV % pred: 88 %
RV: 2.2 L
TLC % pred: 73 %
TLC: 5.49 L

## 2022-10-20 NOTE — Progress Notes (Signed)
PFT done today. 

## 2022-10-21 ENCOUNTER — Other Ambulatory Visit (HOSPITAL_BASED_OUTPATIENT_CLINIC_OR_DEPARTMENT_OTHER): Payer: Self-pay

## 2022-10-22 ENCOUNTER — Other Ambulatory Visit (HOSPITAL_BASED_OUTPATIENT_CLINIC_OR_DEPARTMENT_OTHER): Payer: Self-pay

## 2022-10-22 ENCOUNTER — Encounter: Payer: Self-pay | Admitting: Pulmonary Disease

## 2022-10-22 ENCOUNTER — Ambulatory Visit (INDEPENDENT_AMBULATORY_CARE_PROVIDER_SITE_OTHER): Payer: Medicare HMO | Admitting: Pulmonary Disease

## 2022-10-22 VITALS — BP 125/80 | HR 58 | Ht 71.0 in | Wt 198.0 lb

## 2022-10-22 DIAGNOSIS — Z87891 Personal history of nicotine dependence: Secondary | ICD-10-CM | POA: Diagnosis not present

## 2022-10-22 DIAGNOSIS — Z9889 Other specified postprocedural states: Secondary | ICD-10-CM | POA: Diagnosis not present

## 2022-10-22 DIAGNOSIS — Z951 Presence of aortocoronary bypass graft: Secondary | ICD-10-CM

## 2022-10-22 DIAGNOSIS — J984 Other disorders of lung: Secondary | ICD-10-CM | POA: Diagnosis not present

## 2022-10-22 DIAGNOSIS — J90 Pleural effusion, not elsewhere classified: Secondary | ICD-10-CM

## 2022-10-22 DIAGNOSIS — J439 Emphysema, unspecified: Secondary | ICD-10-CM | POA: Diagnosis not present

## 2022-10-22 MED ORDER — ANORO ELLIPTA 62.5-25 MCG/ACT IN AEPB
1.0000 | INHALATION_SPRAY | Freq: Every day | RESPIRATORY_TRACT | 3 refills | Status: DC
  Filled 2022-10-22: qty 60, 30d supply, fill #0
  Filled 2022-11-09 – 2022-11-15 (×2): qty 60, 30d supply, fill #1
  Filled 2022-12-10: qty 60, 30d supply, fill #2
  Filled 2023-01-12: qty 60, 30d supply, fill #3

## 2022-10-22 NOTE — Progress Notes (Signed)
Synopsis: Referred in September 2023 for shortness of breath by [name], Alferd Apa, *  Subjective:   PATIENT ID: Ronald Petersen GENDER: male DOB: [birth year], MRN: [id number]  Chief Complaint  Patient presents with   Follow-up    PFT    This is a 66 year old gentleman, past medical history of arthritis, type 2 diabetes, gastroesophageal reflux, hyperlipidemia, hypertension, sleep apnea, not on CPAP.Patient was referred for evaluation of shortness of breath.  Patient last seen in the office by Dr. Harriet Masson on 08/06/2022 by cardiology.  7 history of CABG x2, LIMA to LAD SVG to OM December 2022.  He has chronic stable angina currently managed with ranolazine.  Referral placed for evaluation of shortness of breath to pulmonary.  He is a former smoker quit 1987.  He had pulmonary function tests ordered in May 2023 but they have not yet been completed.  Patient states that he still has some shortness of breath on occasion.  He never had his pulmonary function test complete.  I originally saw him back in February after his cardiac surgery with recurrent effusion.  He is also wondering whether or not his effusion could have returned.  He is a former smoker quit more than 30 years ago.  He also describes intermittent left-sided chest pains.  And he cannot tell whether or not it is the pain that radiates around from his back or from his neck.  This seems to be more intermittent and not associated with exertion.  OV 10/22/2022: Here today for follow-up after recent PFTs.  Recent PFTs showed reduced FEV1 and FVC, normal ratio.  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  Some restriction to the chest of the history of cardiac surgery as well as central obesity in the abdomen.  He does have a history of smoking for many years ago.  He is never used any inhalers.     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   Cancer  [facility])    Complication of anesthesia    aspirated with back surgery at age 52   Depression    Diabetes mellitus Type II    GERD (gastroesophageal reflux disease)    Hyperlipidemia    Hypertension    Neuromuscular disorder ([place])    NEUROPATHY   Right rotator cuff tear 11/23/2018   Sleep apnea    no CPAP     Family History  Problem Relation Age of Onset   COPD Mother    Stroke Father    Ovarian cancer Sister    Stomach cancer Sister    Heart disease Sister        MI   Heart disease Sister        MI   Stomach cancer Maternal Grandmother    Alcohol abuse Other    Depression Other    Arthritis Other    Hypertension Other    Coronary artery disease Other    Ovarian cancer Other        neice   Ovarian cancer Other        neice   Colon polyps Neg Hx    Colon cancer Neg Hx      Past Surgical History:  Procedure Laterality Date   APPENDECTOMY     ARTHOSCOPIC ROTAOR CUFF REPAIR Right 11/23/2018   Procedure: ARTHROSCOPIC ROTATOR CUFF REPAIR;  Surgeon: [name], MD;  Location: [place];  Service: Orthopedics;  Laterality: Right;   CHOLECYSTECTOMY     CORONARY ARTERY BYPASS GRAFT N/A 12/24/2021   x2 LIMA to LAD; SVG to [place]   CORONARY STENT INTERVENTION N/A 11/19/2020   Procedure: CORONARY STENT INTERVENTION;  Surgeon: Nelva Bush, MD;  Location: [place] [facility];  Service: Cardiovascular;  Laterality: N/A;   ENDOVEIN HARVEST OF GREATER SAPHENOUS VEIN Right 12/24/2021   Procedure: ENDOVEIN HARVEST OF GREATER SAPHENOUS VEIN;  Surgeon: Lajuana Matte, MD;  Location: [place];  Service: Open Heart Surgery;  Laterality: Right;   INTRAVASCULAR ULTRASOUND/IVUS N/A 11/19/2020   Procedure: Intravascular Ultrasound/IVUS;  Surgeon: Nelva Bush, MD;  Location: [place] [facility];  Service: Cardiovascular;  Laterality: N/A;   IR THORACENTESIS ASP PLEURAL SPACE W/IMG GUIDE  01/31/2022   LEFT HEART CATH AND CORONARY ANGIOGRAPHY N/A 11/19/2020    Procedure: LEFT HEART CATH AND CORONARY ANGIOGRAPHY;  Surgeon: Nelva Bush, MD;  Location: [place] [facility];  Service: Cardiovascular;  Laterality: N/A;   LEFT HEART CATH AND CORONARY ANGIOGRAPHY N/A 12/24/2020   Procedure: LEFT HEART CATH AND CORONARY ANGIOGRAPHY;  Surgeon: Martinique, Peter M, MD;  Location: [place] [facility];  Service: Cardiovascular;  Laterality: N/A;   LEFT HEART CATH AND CORONARY ANGIOGRAPHY N/A 12/16/2021   Procedure: LEFT HEART CATH AND CORONARY ANGIOGRAPHY;  Surgeon: Leonie Man, MD;  Location: [place] [facility];  Service: Cardiovascular;  Laterality: N/A;   LUMBAR LAMINECTOMY     SHOULDER ARTHROSCOPY WITH ROTATOR CUFF REPAIR AND SUBACROMIAL DECOMPRESSION Right 11/23/2018   Procedure: SHOULDER ARTHROSCOPY WITH ROTATOR CUFF REPAIR AND SUBACROMIAL DECOMPRESSION;  Surgeon: [name], MD;  Location: [place];  Service: Orthopedics;  Laterality: Right;   TEE WITHOUT CARDIOVERSION N/A 12/24/2021   Procedure: TRANSESOPHAGEAL ECHOCARDIOGRAM (TEE);  Surgeon: Lajuana Matte, MD;  Location: [place];  Service: Open Heart Surgery;  Laterality: N/A;    Social History   Socioeconomic History   Marital status: Married    Spouse name: Lorriane Shire   Number of children: 2   Years of education: Not on file   Highest education level: Not on file  Occupational History   Occupation: self employed    Fish farm manager: UNEMPLOYED  Tobacco Use   Smoking status: Former    Years: 16.00    Types: Cigarettes    Quit date: 12/29/1985    Years since quitting: 36.8   Smokeless tobacco: Never  Vaping Use   Vaping Use: Never used  Substance and Sexual Activity   Alcohol use: Not Currently   Drug use: No   Sexual activity: Yes    Partners: Female  Other Topics Concern   Not on file  Social History Narrative   Exercise-- 3 days   Pt has hs degree   Right handed   One story home   Drinks caffeine   Social Determinants of Health   Financial Resource Strain:  Low Risk  (02/04/2022)   Overall Financial Resource Strain (CARDIA)    Difficulty of Paying Living Expenses: Not hard at all  Food Insecurity: No Food Insecurity (02/04/2022)   Hunger Vital Sign    Worried About Running Out of Food in the Last Year: Never true    [place] in the Last Year: Never true  Transportation Needs: No Transportation Needs (02/04/2022)   PRAPARE - Hydrologist (Medical): No    Lack of Transportation (Non-Medical): No  Physical Activity: Sufficiently  Active (02/04/2022)   Exercise Vital Sign    Days of Exercise per Week: 7 days    Minutes of Exercise per Session: 30 min  Stress: No Stress Concern Present (02/04/2022)   [place]    Feeling of Stress : Only a little  Social Connections: Moderately Integrated (02/04/2022)   Social Connection and Isolation Panel [NHANES]    Frequency of Communication with Friends and Family: Three times a week    Frequency of Social Gatherings with Friends and Family: More than three times a week    Attends Religious Services: More than 4 times per year    Active Member of [employer] or Organizations: No    Attends Archivist Meetings: Never    Marital Status: Married  Human resources officer Violence: Not At Risk (02/04/2022)   Humiliation, Afraid, Rape, and Kick questionnaire    Fear of Current or Ex-Partner: No    Emotionally Abused: No    Physically Abused: No    Sexually Abused: No     Allergies  Allergen Reactions   Niacin Anaphylaxis     Outpatient Medications Prior to Visit  Medication Sig Dispense Refill   acetaminophen (TYLENOL) 650 MG CR tablet Take 650 mg by mouth every 8 (eight) hours as needed for pain.     aspirin EC 325 MG EC tablet Take 1 tablet (325 mg total) by mouth daily.     ELDERBERRY PO Take 300 mg by mouth daily.     empagliflozin (JARDIANCE) 10 MG TABS tablet Take 1 tablet (10 mg total) by mouth daily. 30  tablet 3   famotidine (PEPCID) 20 MG tablet Take 1 tablet (20 mg total) by mouth daily. 30 tablet 2   fenofibrate 160 MG tablet TAKE 1 TABLET (160 MG TOTAL) BY MOUTH DAILY. 90 tablet 1   furosemide (LASIX) 40 MG tablet Take 1 tablet (40 mg total) by mouth daily. (Patient taking differently: Take 40 mg by mouth every other day.) 90 tablet 3   gabapentin (NEURONTIN) 800 MG tablet Take 1 tablet (800 mg total) by mouth 3 (three) times daily. 270 tablet 2   glucose blood (ONETOUCH VERIO) test strip USE AS INSTRUCTED TO CHECK BLOOD SUGAR ONCE A DAY 100 strip 12   Magnesium Citrate 200 MG TABS Take 400 mg by mouth daily.     ondansetron (ZOFRAN) 4 MG tablet Take 1 tablet (4 mg total) by mouth every 8 (eight) hours as needed. 20 tablet 2   pantoprazole (PROTONIX) 40 MG tablet Take 1 tablet (40 mg total) by mouth 2 (two) times daily. 60 tablet 3   potassium chloride SA (KLOR-CON M) 20 MEQ tablet Take 1 tablet by mouth daily for three days then resume taking 1 tablet every other day 90 tablet 3   ranolazine (RANEXA) 1000 MG SR tablet Take 1 tablet (1,000 mg total) by mouth 2 (two) times daily. 180 tablet 3   sertraline (ZOLOFT) 100 MG tablet TAKE 1 & 1/2 TABLET BY MOUTH ONCE DAILY 135 tablet 3   tiZANidine (ZANAFLEX) 4 MG tablet Take 1 tablet (4 mg total) by mouth every 6 (six) hours as needed for muscle spasms. 30 tablet 1   traZODone (DESYREL) 50 MG tablet Take 1 tablet (50 mg total) by mouth at bedtime as needed for sleep. 30 tablet 3   vitamin E 180 MG (400 UNITS) capsule Take 1,600 Units by mouth daily.     No facility-administered medications prior to visit.  Review of Systems  Constitutional:  Negative for chills, fever, malaise/fatigue and weight loss.  HENT:  Negative for hearing loss, sore throat and tinnitus.   Eyes:  Negative for blurred vision and double vision.  Respiratory:  Positive for shortness of breath. Negative for cough, hemoptysis, sputum production, wheezing and stridor.    Cardiovascular:  Negative for chest pain, palpitations, orthopnea, leg swelling and PND.  Gastrointestinal:  Negative for abdominal pain, constipation, diarrhea, heartburn, nausea and vomiting.  Genitourinary:  Negative for dysuria, hematuria and urgency.  Musculoskeletal:  Negative for joint pain and myalgias.  Skin:  Negative for itching and rash.  Neurological:  Negative for dizziness, tingling, weakness and headaches.  Endo/Heme/Allergies:  Negative for environmental allergies. Does not bruise/bleed easily.  Psychiatric/Behavioral:  Negative for depression. The patient is not nervous/anxious and does not have insomnia.   All other systems reviewed and are negative.    Objective:  Physical Exam Vitals reviewed.  Constitutional:      General: He is not in acute distress.    Appearance: He is well-developed.  HENT:     Head: Normocephalic and atraumatic.  Eyes:     General: No scleral icterus.    Conjunctiva/sclera: Conjunctivae normal.     Pupils: Pupils are equal, round, and reactive to light.  Neck:     Vascular: No JVD.     Trachea: No tracheal deviation.  Cardiovascular:     Rate and Rhythm: Normal rate and regular rhythm.     Heart sounds: Normal heart sounds. No murmur heard. Pulmonary:     Effort: Pulmonary effort is normal. No tachypnea, accessory muscle usage or respiratory distress.     Breath sounds: No stridor. No wheezing, rhonchi or rales.  Abdominal:     General: There is no distension.     Palpations: Abdomen is soft.     Tenderness: There is no abdominal tenderness.  Musculoskeletal:        General: No tenderness.     Cervical back: Neck supple.  Lymphadenopathy:     Cervical: No cervical adenopathy.  Skin:    General: Skin is warm and dry.     Capillary Refill: Capillary refill takes less than 2 seconds.     Findings: No rash.  Neurological:     Mental Status: He is alert and oriented to person, place, and time.  Psychiatric:        Behavior:  Behavior normal.      Vitals:   10/22/22 0928  BP: 125/80  Pulse: (!) 58  SpO2: 98%  Weight: 198 lb (89.8 kg)  Height: '5\' 11"'$  (1.803 m)   98% on RA BMI Readings from Last 3 Encounters:  10/22/22 27.62 kg/m  09/15/22 27.20 kg/m  09/10/22 27.31 kg/m   Wt Readings from Last 3 Encounters:  10/22/22 198 lb (89.8 kg)  09/15/22 195 lb (88.5 kg)  09/10/22 195 lb 12.8 oz (88.8 kg)     CBC    Component Value Date/Time   WBC 4.8 06/03/2022 1411   RBC 5.29 06/03/2022 1411   HGB 14.0 06/03/2022 1411   HGB 14.7 05/14/2022 1236   HCT 42.3 06/03/2022 1411   HCT 44.9 05/14/2022 1236   PLT 175.0 06/03/2022 1411   PLT 174 05/14/2022 1236   MCV 80.0 06/03/2022 1411   MCV 80 05/14/2022 1236   MCH 26.2 (L) 05/14/2022 1236   MCH 27.0 01/25/2022 0120   MCHC 33.0 06/03/2022 1411   RDW 17.2 (H) 06/03/2022 1411   RDW  16.3 (H) 05/14/2022 1236   LYMPHSABS 1.7 06/03/2022 1411   LYMPHSABS 1.7 05/14/2022 1236   MONOABS 0.4 06/03/2022 1411   EOSABS 0.1 06/03/2022 1411   EOSABS 0.2 05/14/2022 1236   BASOSABS 0.0 06/03/2022 1411   BASOSABS 0.1 05/14/2022 1236     Chest Imaging:  May 2023 chest x-ray: No recurrent effusion.  Lung [place] clear The patient's images have been independently reviewed by me.    Pulmonary Functions Testing Results:    Latest Ref Rng & Units 10/20/2022    3:52 PM  PFT Results  FVC-Pre L 3.22  P  FVC-Predicted Pre % 66  P  FVC-Post L 3.16  P  FVC-Predicted Post % 64  P  Pre FEV1/FVC % % 73  P  Post FEV1/FCV % % 76  P  FEV1-Pre L 2.36  P  FEV1-Predicted Pre % 65  P  FEV1-Post L 2.42  P  DLCO uncorrected ml/min/mmHg 28.27  P  DLCO UNC% % 100  P  DLCO corrected ml/min/mmHg 28.27  P  DLCO COR %Predicted % 100  P  DLVA Predicted % 131  P  TLC L 5.49  P  TLC % Predicted % 73  P  RV % Predicted % 88  P    P Preliminary result    FeNO:   Pathology:   Echocardiogram:   Heart Catheterization:     Assessment & Plan:     ICD-10-CM   1.  Mixed restrictive and obstructive lung disease ([place])  J43.9    J98.4     2. Former smoker  Z87.891     3. Pleural effusion  J90     4. S/P CABG x 2  Z95.1     5. S/P thoracentesis  Z98.890       Discussion:  This is a 66 year old gentleman complaint of shortness of breath.  Predominantly occurred after his CABG he had bilateral pleural effusions.  This is seemingly resolved on his follow-up chest x-rays.  Does have a lower lobe area of scarring.  PFTs show restrictive defect which I think is related to his chest surgery.  He is a former smoker may have a mixed pattern.  Plan: We will give him a trial of Anoro.  He is going to let us know how he is doing with this new inhaler regimen. Reviewed his PFTs today in the office. Overall he feels like he is getting better. Return to clinic in 6 months or as needed. RTC me or APP in 6 months.    Current Outpatient Medications:    umeclidinium-vilanterol (ANORO ELLIPTA) 62.5-25 MCG/ACT AEPB, Inhale 1 puff into the lungs daily., Disp: 30 each, Rfl: 3   acetaminophen (TYLENOL) 650 MG CR tablet, Take 650 mg by mouth every 8 (eight) hours as needed for pain., Disp: , Rfl:    aspirin EC 325 MG EC tablet, Take 1 tablet (325 mg total) by mouth daily., Disp: , Rfl:    ELDERBERRY PO, Take 300 mg by mouth daily., Disp: , Rfl:    empagliflozin (JARDIANCE) 10 MG TABS tablet, Take 1 tablet (10 mg total) by mouth daily., Disp: 30 tablet, Rfl: 3   famotidine (PEPCID) 20 MG tablet, Take 1 tablet (20 mg total) by mouth daily., Disp: 30 tablet, Rfl: 2   fenofibrate 160 MG tablet, TAKE 1 TABLET (160 MG TOTAL) BY MOUTH DAILY., Disp: 90 tablet, Rfl: 1   furosemide (LASIX) 40 MG tablet, Take 1 tablet (40 mg total) by mouth daily. (  Patient taking differently: Take 40 mg by mouth every other day.), Disp: 90 tablet, Rfl: 3   gabapentin (NEURONTIN) 800 MG tablet, Take 1 tablet (800 mg total) by mouth 3 (three) times daily., Disp: 270 tablet, Rfl: 2   glucose blood  (ONETOUCH VERIO) test strip, USE AS INSTRUCTED TO CHECK BLOOD SUGAR ONCE A DAY, Disp: 100 strip, Rfl: 12   Magnesium Citrate 200 MG TABS, Take 400 mg by mouth daily., Disp: , Rfl:    ondansetron (ZOFRAN) 4 MG tablet, Take 1 tablet (4 mg total) by mouth every 8 (eight) hours as needed., Disp: 20 tablet, Rfl: 2   pantoprazole (PROTONIX) 40 MG tablet, Take 1 tablet (40 mg total) by mouth 2 (two) times daily., Disp: 60 tablet, Rfl: 3   potassium chloride SA (KLOR-CON M) 20 MEQ tablet, Take 1 tablet by mouth daily for three days then resume taking 1 tablet every other day, Disp: 90 tablet, Rfl: 3   ranolazine (RANEXA) 1000 MG SR tablet, Take 1 tablet (1,000 mg total) by mouth 2 (two) times daily., Disp: 180 tablet, Rfl: 3   sertraline (ZOLOFT) 100 MG tablet, TAKE 1 & 1/2 TABLET BY MOUTH ONCE DAILY, Disp: 135 tablet, Rfl: 3   tiZANidine (ZANAFLEX) 4 MG tablet, Take 1 tablet (4 mg total) by mouth every 6 (six) hours as needed for muscle spasms., Disp: 30 tablet, Rfl: 1   traZODone (DESYREL) 50 MG tablet, Take 1 tablet (50 mg total) by mouth at bedtime as needed for sleep., Disp: 30 tablet, Rfl: 3   vitamin E 180 MG (400 UNITS) capsule, Take 1,600 Units by mouth daily., Disp: , Rfl:     Garner Nash, DO [facility] 10/22/2022 10:01 AM

## 2022-10-22 NOTE — Patient Instructions (Signed)
Thank you for visiting Dr. Valeta Harms at [facility]. Today we recommend the following:   Meds ordered this encounter  Medications   umeclidinium-vilanterol (ANORO ELLIPTA) 62.5-25 MCG/ACT AEPB    Sig: Inhale 1 puff into the lungs daily.    Dispense:  30 each    Refill:  3   Return in about 6 months (around 04/23/2023) for with APP or Dr. Valeta Harms.    Please do your part to reduce the spread of COVID-19.

## 2022-10-27 ENCOUNTER — Ambulatory Visit (INDEPENDENT_AMBULATORY_CARE_PROVIDER_SITE_OTHER): Payer: Medicare HMO | Admitting: Neurology

## 2022-10-27 ENCOUNTER — Telehealth: Payer: Self-pay | Admitting: Neurology

## 2022-10-27 ENCOUNTER — Other Ambulatory Visit (HOSPITAL_BASED_OUTPATIENT_CLINIC_OR_DEPARTMENT_OTHER): Payer: Self-pay

## 2022-10-27 DIAGNOSIS — M48061 Spinal stenosis, lumbar region without neurogenic claudication: Secondary | ICD-10-CM

## 2022-10-27 DIAGNOSIS — D560 Alpha thalassemia: Secondary | ICD-10-CM | POA: Diagnosis not present

## 2022-10-27 DIAGNOSIS — E1142 Type 2 diabetes mellitus with diabetic polyneuropathy: Secondary | ICD-10-CM | POA: Diagnosis not present

## 2022-10-27 DIAGNOSIS — R209 Unspecified disturbances of skin sensation: Secondary | ICD-10-CM | POA: Diagnosis not present

## 2022-10-27 DIAGNOSIS — G621 Alcoholic polyneuropathy: Secondary | ICD-10-CM | POA: Diagnosis not present

## 2022-10-27 DIAGNOSIS — E519 Thiamine deficiency, unspecified: Secondary | ICD-10-CM

## 2022-10-27 DIAGNOSIS — M47812 Spondylosis without myelopathy or radiculopathy, cervical region: Secondary | ICD-10-CM | POA: Diagnosis not present

## 2022-10-27 DIAGNOSIS — R269 Unspecified abnormalities of gait and mobility: Secondary | ICD-10-CM

## 2022-10-27 DIAGNOSIS — E560 Deficiency of vitamin E: Secondary | ICD-10-CM

## 2022-10-27 DIAGNOSIS — E538 Deficiency of other specified B group vitamins: Secondary | ICD-10-CM

## 2022-10-27 DIAGNOSIS — R69 Illness, unspecified: Secondary | ICD-10-CM | POA: Diagnosis not present

## 2022-10-27 NOTE — Telephone Encounter (Signed)
Discussed the results of patient's EMG after the procedure today. 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 It also showed carpal tunnel syndrome. I recommended cock up wrist splint for CTS symptoms.  All questions were answered.  Kai Levins, MD [facility]

## 2022-10-27 NOTE — Procedures (Signed)
[facility]  [place], [address]  [place], [place] [address] Tel: [phone] Fax: [phone] Test Date:  10/27/2022  Patient: Ronald Petersen DOB: 1956/09/19 Physician: Kai Levins, MD  Sex: Male Height: '5\' 11"'$  Ref Phys: Kai Levins, MD  ID#: [id number]   Technician:    History: This is a 66 year old male with shooting pains in his feet.  NCV & EMG Findings: Extensive electrodiagnostic evaluation of the right upper and lower limbs shows: Absent right sural, superficial fibular, and median sensory responses. Right ulnar and radial sensory responses are within normal limits. Right tibial (AH) motor response shows reduced amplitude (2.6 mV). Right median (APB) motor response shows prolonged distal onset latency (7.1 ms). Right fibular (EDB) and ulnar (ADM) motor responses are within normal limits. Right H reflex is absent. Chronic motor axon loss changes without accompanying active denervation changes are seen in the right tibialis anterior, medial head of gastrocnemius, first dorsal interosseous, and extensor indicis proprius muscles.  Impression: This is an abnormal electrodiagnostic evaluation. The findings are most consistent with the following: Evidence of a generalized sensorimotor polyneuropathy, axon loss in type, mild in degree electrically. Evidence of a right median mononeuropathy at or distal to the wrist, consistent with carpal tunnel syndrome, severe in degree electrically. The residuals of an old intraspinal canal lesion (ie: motor radiculopathy) at the right C8 root, mild in degree electrically. No definite evidence of a right lumbosacral (L3-S1) motor radiculopathy.   ___________________________ Kai Levins, MD    Nerve Conduction Studies Motor Nerve Results    Latency Amplitude F-Lat Segment Distance CV Comment  Site (ms) Norm (mV) Norm (ms)  (cm) (m/s) Norm   Right Fibular (EDB) Motor  Ankle 4.2  < 6.0 2.5  > 2.5        Bel fib head 12.4  - 1.60 -  Bel fib head-Ankle 34 41  > 40   Pop fossa 14.3 - 1.51 -  Pop fossa-Bel fib head 8 42 -   Right Median (APB) Motor  Wrist *7.1  < 4.0 5.5  > 5.0        Elbow 13.5 - 5.1 -  Elbow-Wrist 32 50  > 50   Right Tibial (AH) Motor  Ankle 4.7  < 6.0 *2.6  > 4.0        Knee 15.1 - 2.1 -  Knee-Ankle 42 40  > 40   post exercise 4.7 - 2.4 -        Right Ulnar (ADM) Motor  Wrist 3.0  < 3.1 7.2  > 7.0        Bel elbow 7.7 - 7.2 -  Bel elbow-Wrist 24.5 52  > 50   Ab elbow 9.7 - 7.0 -  Ab elbow-Bel elbow 10 50 -    Sensory Sites    Neg Peak Lat Amplitude (O-P) Segment Distance Velocity Comment  Site (ms) Norm (V) Norm  (cm) (ms)   Right Median Sensory  Wrist-Dig II *NR  < 3.8 *NR  > 10 Wrist-Dig II 13    Right Radial Sensory  Forearm-Wrist 2.8  < 2.8 17  > 10 Forearm-Wrist 10    Right Superficial Fibular Sensory  14 cm-Ankle *NR  < 4.6 *NR  > 3 14 cm-Ankle 14    Right Sural Sensory  Calf-Lat mall *NR  < 4.6 *NR  > 3 Calf-Lat mall 14    Right Ulnar Sensory  Wrist-Dig V 3.2  < 3.2 16  > 5 Wrist-Dig  V 11     H-Reflex Results    M-Lat H Lat H Neg Amp H-M Lat  Site (ms) (ms) Norm (mV) (ms)  Right Tibial H-Reflex  Pop fossa 7.6 -  < 35.0 - -   Electromyography   Side Muscle Ins.Act Fibs Fasc Recrt Amp Dur Poly Activation Comment  Right Tib ant Nml Nml Nml *1- *1+ *1+ Nml Nml N/A  Right Gastroc MH Nml Nml Nml *2- *1+ *1+ *1+ Nml N/A  Right Rectus fem Nml Nml Nml Nml Nml Nml Nml Nml N/A  Right Biceps fem SH Nml Nml Nml Nml Nml Nml Nml Nml N/A  Right Gluteus med Nml Nml Nml Nml Nml Nml Nml Nml N/A  Right FDI Nml Nml Nml *1- *1- *1- Nml Nml N/A  Right EIP Nml Nml Nml *1- *1- *1- Nml Nml N/A  Right APB Nml Nml Nml Nml Nml Nml Nml Nml N/A  Right Biceps Nml Nml Nml Nml Nml Nml Nml Nml N/A  Right Triceps Nml Nml Nml Nml Nml Nml Nml Nml N/A  Right Deltoid Nml Nml Nml Nml Nml Nml Nml Nml N/A     Waveforms:  Motor           Sensory             H-Reflex

## 2022-11-06 ENCOUNTER — Other Ambulatory Visit (HOSPITAL_BASED_OUTPATIENT_CLINIC_OR_DEPARTMENT_OTHER): Payer: Self-pay

## 2022-11-09 ENCOUNTER — Other Ambulatory Visit: Payer: Self-pay | Admitting: Family Medicine

## 2022-11-09 DIAGNOSIS — R110 Nausea: Secondary | ICD-10-CM

## 2022-11-09 DIAGNOSIS — G8929 Other chronic pain: Secondary | ICD-10-CM

## 2022-11-10 ENCOUNTER — Other Ambulatory Visit (HOSPITAL_BASED_OUTPATIENT_CLINIC_OR_DEPARTMENT_OTHER): Payer: Self-pay

## 2022-11-10 MED ORDER — TIZANIDINE HCL 4 MG PO TABS
4.0000 mg | ORAL_TABLET | Freq: Four times a day (QID) | ORAL | 1 refills | Status: DC | PRN
  Filled 2022-11-10: qty 30, 8d supply, fill #0
  Filled 2022-12-08: qty 30, 8d supply, fill #1

## 2022-11-10 MED ORDER — ONDANSETRON HCL 4 MG PO TABS
4.0000 mg | ORAL_TABLET | Freq: Three times a day (TID) | ORAL | 2 refills | Status: DC | PRN
  Filled 2022-11-10: qty 20, 7d supply, fill #0
  Filled 2022-12-08: qty 20, 7d supply, fill #1
  Filled 2023-01-12: qty 20, 7d supply, fill #2

## 2022-11-17 ENCOUNTER — Other Ambulatory Visit (HOSPITAL_BASED_OUTPATIENT_CLINIC_OR_DEPARTMENT_OTHER): Payer: Self-pay

## 2022-11-24 ENCOUNTER — Other Ambulatory Visit (HOSPITAL_BASED_OUTPATIENT_CLINIC_OR_DEPARTMENT_OTHER): Payer: Self-pay

## 2022-11-25 ENCOUNTER — Ambulatory Visit: Payer: Medicare HMO | Admitting: Family Medicine

## 2022-12-01 ENCOUNTER — Ambulatory Visit: Payer: Medicare HMO | Admitting: Family Medicine

## 2022-12-02 ENCOUNTER — Ambulatory Visit: Payer: Medicare HMO | Admitting: Family Medicine

## 2022-12-02 NOTE — Progress Notes (Deleted)
I saw Ronald Petersen in neurology clinic on 12/05/22 in follow up for neuropathy.  HPI: Ronald Petersen is a 66 y.o. year old male with a history of DM2, HTN, HLD, CAD s/p CABG (11/2021), back surgery (at age 52, cannot remember the lumbar levels), OA, anxiety, right rotator cuff tear, OSA (not on CPAP), and prior EtOH abuse  who we last saw on 09/05/22.  To briefly review: Patient is having shooting pains in his feet shooting into his toes. His left leg is worse. He endorses numbness and tingling that extends to his calves. The tingling in his feet is near constant. He has arthritis in his hands and sometimes has tingling in his hands, but not as bad as legs. He still has significant back and neck pain (see previous history below). When he walks, he looks like a drunk person. He does not use an assistive device. He has not fallen in the last 3-4 months, but has lost balance and fallen this year (he thinks it is less than 5 times this year). He endorses weakness in the legs. He has difficulty going up and down stairs and feels like he trips over his own feet (?foot drop). He thinks most of the falls are related to not feeling his feet.   He does endorse some bowel continence. He denies saddle anaesthesia.   The patient denies any constitutional symptoms like fever, night sweats, anorexia or unintentional weight loss.   EtOH use: Patient has not drank EtOH in 2 years. Previously was a heavier drinker (4-5 beers per day) Restrictive diet? No Family history of neuropathy/myopathy/NM disease? Sister had neuropathy (did not have diabetes)   Previously patient has seen Dr. Tomi Likens in the past (most recently on 02/26/21 for cognitive complaints and in 2020 for leg weakness). Per Dr. Georgie Chard clinic note on 06/03/2019: Patient has history of chronic neck and back pain with history of lumbar laminectomy.  He is on multiple medications for chronic pain.  He reports radicular pain down the back of his legs when  he walks.  Lumbar spine X-ray from 01/04/19 personally reviewed and demonstrated multilevel arthritic changes with moderate disc space narrowing at L4-5 and L5-S1 as well as facet arthritic changes at L3-4, L4-5 and L5-S1 bilaterally and anterior osteophytes at L4, L5, and S1.  He also reports worsening neck pain as well, radiating down both sides and into his shoulders.  Neck pain is followed by Dr. Lorin Mercy.  Due to chronic neck pain, cervical X-ray from 05/20/19 showed spondylosis most prominent at C5-6 and C6-7.  Over the past year, he reports worsening weakness and pain in the legs.  He also reports weakness in the upper extremities as well.  He has significant numbness in the legs as well as in the hands radiating up the arms.  He was diagnosed with severe carpal tunnel syndrome.  Labs from February include B12 342 and low B1 of 7.  He has a history of alcohol dependence and reports that he was drinking about 6 beers every other day up until 3 months ago.  Hgb A1c from 05/20/19 was elevated at 9.7.  About a month ago, he developed positional vertigo described as spinning sensation lasting seconds with change in head movement.   Per patient, he has not been cleared by cardiology for neck or back surgery.    Patient is currently on gabapentin 800 mg. He is prescribed TID, but usually takes it BID, sometimes TID (based on if he remembers).  He is not sure if it is helping. Patient is currently on Zoloft for depression (per wife this works well). He is taking B12 1500 mcg.   He does not remember previously tried medications and does not think he has ever tried topical treatment. He has previously been recommended for PT, but his co-pay and ability to pay has not allowed him to go.   Patient would like to know where the weakness is coming from and if there is anything that can help.  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 Patient has some financial constraints, so work up was discussed and tailored to his situation. In addition, financial assistance information through [facility] was given. I have made the appropriate referrals, including PT that patient could not previously afford, understanding that all testing may not be complete. We will prioritize lab testing to look for other treatable causes of neuropathy.   PLAN: -Blood work: B12, MMA, B1, vit E, copper, IFE, SPEP -Patient to get in touch with me about how much B12 he is currently taking -Discussed DM mgmt and staying off EtOH to prevent worsening neuropathy -Referral to PT - Financial assistance info given -Discussed EMG - concerned about cost - will order in [facility] EMG. Patient will defer if cost prohibitive -Continue gabapentin 800 mg TID for now. May need to reduce if concern for ataxia or if another medication is desired. -Recommended topical Lidocaine cream over the counter for tingling  Since their last visit: ***  Labs showed no significant abnormalities after last clinic visit.  EMG on 10/27/22 was consistent with a distal symmetric large fiber polyneuropathy, severe right CTS, and residuals of an old right C8 radiculopathy.  ROS: Pertinent positive and negative systems reviewed in HPI. ***   MEDICATIONS:  Outpatient Encounter Medications as of 12/05/2022  Medication Sig   acetaminophen (TYLENOL) 650 MG CR tablet Take 650 mg by mouth every 8 (eight) hours as needed for pain.   aspirin EC 325 MG EC tablet Take 1 tablet (325 mg total) by mouth daily.   ELDERBERRY PO Take 300 mg by mouth daily.   empagliflozin (JARDIANCE) 10 MG TABS tablet Take 1 tablet (10 mg total) by mouth daily.   famotidine (PEPCID) 20 MG tablet Take 1 tablet (20 mg total) by mouth daily.   fenofibrate 160 MG tablet TAKE 1  TABLET (160 MG TOTAL) BY MOUTH DAILY.   furosemide (LASIX) 40 MG tablet Take 1 tablet (40 mg total) by mouth daily. (Patient taking differently: Take 40 mg by mouth every other day.)   gabapentin (NEURONTIN) 800 MG tablet Take 1 tablet (800 mg total) by mouth 3 (three) times daily.   glucose blood (ONETOUCH VERIO) test strip USE AS INSTRUCTED TO CHECK BLOOD SUGAR ONCE A DAY   Magnesium Citrate 200 MG TABS Take 400 mg by mouth daily.   ondansetron (ZOFRAN) 4 MG tablet Take 1 tablet (4 mg total) by mouth every 8 (eight) hours as needed.   pantoprazole (PROTONIX) 40 MG tablet Take 1 tablet (40 mg total) by mouth 2 (two) times daily.   potassium chloride SA (KLOR-CON M) 20 MEQ tablet Take 1 tablet by mouth daily for three days then resume taking 1 tablet every other day   ranolazine (RANEXA) 1000 MG SR tablet Take 1 tablet (1,000 mg total) by mouth 2 (  two) times daily.   sertraline (ZOLOFT) 100 MG tablet TAKE 1 & 1/2 TABLET BY MOUTH ONCE DAILY   tiZANidine (ZANAFLEX) 4 MG tablet Take 1 tablet (4 mg total) by mouth every 6 (six) hours as needed for muscle spasms.   traZODone (DESYREL) 50 MG tablet Take 1 tablet (50 mg total) by mouth at bedtime as needed for sleep.   umeclidinium-vilanterol (ANORO ELLIPTA) 62.5-25 MCG/ACT AEPB Inhale 1 puff into the lungs daily.   vitamin E 180 MG (400 UNITS) capsule Take 1,600 Units by mouth daily.   No facility-administered encounter medications on file as of 12/05/2022.    PAST MEDICAL HISTORY: 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   Cancer [facility])    Complication of anesthesia    aspirated with back surgery at age 30   Depression    Diabetes mellitus Type II    GERD (gastroesophageal reflux disease)    Hyperlipidemia    Hypertension    Neuromuscular disorder ([place])    NEUROPATHY   Right rotator cuff tear 11/23/2018   Sleep apnea    no CPAP    PAST SURGICAL  HISTORY: Past Surgical History:  Procedure Laterality Date   APPENDECTOMY     ARTHOSCOPIC ROTAOR CUFF REPAIR Right 11/23/2018   Procedure: ARTHROSCOPIC ROTATOR CUFF REPAIR;  Surgeon: [name], MD;  Location: [place];  Service: Orthopedics;  Laterality: Right;   CHOLECYSTECTOMY     CORONARY ARTERY BYPASS GRAFT N/A 12/24/2021   x2 LIMA to LAD; SVG to Obtuse Marginal   CORONARY STENT INTERVENTION N/A 11/19/2020   Procedure: CORONARY STENT INTERVENTION;  Surgeon: Nelva Bush, MD;  Location: [place] [facility];  Service: Cardiovascular;  Laterality: N/A;   ENDOVEIN HARVEST OF GREATER SAPHENOUS VEIN Right 12/24/2021   Procedure: ENDOVEIN HARVEST OF GREATER SAPHENOUS VEIN;  Surgeon: Lajuana Matte, MD;  Location: [place];  Service: Open Heart Surgery;  Laterality: Right;   INTRAVASCULAR ULTRASOUND/IVUS N/A 11/19/2020   Procedure: Intravascular Ultrasound/IVUS;  Surgeon: Nelva Bush, MD;  Location: [place] [facility];  Service: Cardiovascular;  Laterality: N/A;   IR THORACENTESIS ASP PLEURAL SPACE W/IMG GUIDE  01/31/2022   LEFT HEART CATH AND CORONARY ANGIOGRAPHY N/A 11/19/2020   Procedure: LEFT HEART CATH AND CORONARY ANGIOGRAPHY;  Surgeon: Nelva Bush, MD;  Location: [place] [facility];  Service: Cardiovascular;  Laterality: N/A;   LEFT HEART CATH AND CORONARY ANGIOGRAPHY N/A 12/24/2020   Procedure: LEFT HEART CATH AND CORONARY ANGIOGRAPHY;  Surgeon: Martinique, Peter M, MD;  Location: [place] [facility];  Service: Cardiovascular;  Laterality: N/A;   LEFT HEART CATH AND CORONARY ANGIOGRAPHY N/A 12/16/2021   Procedure: LEFT HEART CATH AND CORONARY ANGIOGRAPHY;  Surgeon: Leonie Man, MD;  Location: [place] [facility];  Service: Cardiovascular;  Laterality: N/A;   LUMBAR LAMINECTOMY     SHOULDER ARTHROSCOPY WITH ROTATOR CUFF REPAIR AND SUBACROMIAL DECOMPRESSION Right 11/23/2018   Procedure: SHOULDER ARTHROSCOPY WITH ROTATOR CUFF REPAIR AND SUBACROMIAL  DECOMPRESSION;  Surgeon: [name], MD;  Location: [place];  Service: Orthopedics;  Laterality: Right;   TEE WITHOUT CARDIOVERSION N/A 12/24/2021   Procedure: TRANSESOPHAGEAL ECHOCARDIOGRAM (TEE);  Surgeon: Lajuana Matte, MD;  Location: [place];  Service: Open Heart Surgery;  Laterality: N/A;    ALLERGIES: Allergies  Allergen Reactions   Niacin Anaphylaxis    FAMILY HISTORY: Family  History  Problem Relation Age of Onset   COPD Mother    Stroke Father    Ovarian cancer Sister    Stomach cancer Sister    Heart disease Sister        MI   Heart disease Sister        MI   Stomach cancer Maternal Grandmother    Alcohol abuse Other    Depression Other    Arthritis Other    Hypertension Other    Coronary artery disease Other    Ovarian cancer Other        neice   Ovarian cancer Other        neice   Colon polyps Neg Hx    Colon cancer Neg Hx     SOCIAL HISTORY: Social History   Tobacco Use   Smoking status: Former    Years: 16.00    Types: Cigarettes    Quit date: 12/29/1985    Years since quitting: 36.9   Smokeless tobacco: Never  Vaping Use   Vaping Use: Never used  Substance Use Topics   Alcohol use: Not Currently   Drug use: No   Social History   Social History Narrative   Exercise-- 3 days   Pt has hs degree   Right handed   One story home   Drinks caffeine    Objective:  Vital Signs:  There were no vitals taken for this visit.  General:*** General appearance: Awake and alert. No distress. Cooperative with exam. Skin: No rash or jaundice. HEENT: Atraumatic. Anicteric. Lungs: Non-labored breathing on room air  Heart: Regular Abdomen: Soft, non tender. Extremities: No edema. No obvious deformity.  Musculoskeletal: No obvious joint swelling.  Neurological: Mental Status: Alert. Speech fluent. No pseudobulbar affect Cranial Nerves: CNII: No RAPD. Visual [place] intact. CNIII, IV, VI: PERRL. No nystagmus. EOMI. CN V:  Facial sensation intact bilaterally to fine touch. [place] clench strong. Jaw jerk***. CN VII: Facial muscles symmetric and strong. No ptosis at rest or after sustained upgaze***. CN VIII: Hears finger rub well bilaterally. CN IX: No hypophonia. CN X: Palate elevates symmetrically. CN XI: Full strength shoulder shrug bilaterally. CN XII: Tongue protrusion full and midline. No atrophy or fasciculations. No significant dysarthria*** Motor: Tone is ***. *** fasciculations in *** extremities. *** atrophy. No grip or percussive myotonia.  Individual muscle group testing (MRC grade out of 5):  Movement     Neck flexion ***    Neck extension ***     Right Left   Shoulder abduction *** ***   Shoulder adduction *** ***   Shoulder ext rotation *** ***   Shoulder int rotation *** ***   Elbow flexion *** ***   Elbow extension *** ***   Wrist extension *** ***   Wrist flexion *** ***   Finger abduction - FDI *** ***   Finger abduction - ADM *** ***   Finger extension *** ***   Finger distal flexion - 2/3 *** ***   Finger distal flexion - 4/5 *** ***   Thumb flexion - FPL *** ***   Thumb abduction - APB *** ***    Hip flexion *** ***   Hip extension *** ***   Hip adduction *** ***   Hip abduction *** ***   Knee extension *** ***   Knee flexion *** ***   Dorsiflexion *** ***   Plantarflexion *** ***   Inversion *** ***   Eversion *** ***   Great toe extension *** ***  Great toe flexion *** ***     Reflexes:  Right Left  Bicep *** ***  Tricep *** ***  BrRad *** ***  Knee *** ***  Ankle *** ***   Pathological Reflexes: Babinski: *** response bilaterally*** Hoffman: *** Troemner: *** Pectoral: *** Palmomental: *** Facial: *** Midline tap: *** Sensation: Pinprick: *** Vibration: *** Temperature: *** Proprioception: *** Coordination: Intact finger-to- nose-finger and heel-to-shin bilaterally. Romberg negative.*** Gait: Able to rise from chair with arms crossed  unassisted. Normal, narrow-based gait. Able to tandem walk. Able to walk on toes and heels.***   Lab and Test Review: New results: 09/05/22 B12: >1500 Normal or unremarkable: B1, vit E, MMA, copper, SPEP, IFE  EMG (10/27/22): NCV & EMG Findings: Extensive electrodiagnostic evaluation of the right upper and lower limbs shows: Absent right sural, superficial fibular, and median sensory responses. Right ulnar and radial sensory responses are within normal limits. Right tibial (AH) motor response shows reduced amplitude (2.6 mV). Right median (APB) motor response shows prolonged distal onset latency (7.1 ms). Right fibular (EDB) and ulnar (ADM) motor responses are within normal limits. Right H reflex is absent. Chronic motor axon loss changes without accompanying active denervation changes are seen in the right tibialis anterior, medial head of gastrocnemius, first dorsal interosseous, and extensor indicis proprius muscles.   Impression: This is an abnormal electrodiagnostic evaluation. The findings are most consistent with the following: Evidence of a generalized sensorimotor polyneuropathy, axon loss in type, mild in degree electrically. Evidence of a right median mononeuropathy at or distal to the wrist, consistent with carpal tunnel syndrome, severe in degree electrically. The residuals of an old intraspinal canal lesion (ie: motor radiculopathy) at the right C8 root, mild in degree electrically. No definite evidence of a right lumbosacral (L3-S1) motor radiculopathy.  Previously reviewed results: Normal or unremarkable: TSH, lipid panel, CBC, CMP, HIV HbA1c (06/03/22): 7.2 B12 (02/26/21): 306 B1 (02/26/21): 13 Vit D (01/17/21): 42   Lumbar spine xray (06/03/22): FINDINGS: There is no evidence of lumbar spine fracture. Alignment is normal. There is moderate disc space narrowing at L4-L5 and L5-S1 with endplate osteophyte formation, similar to the prior study. Degenerative osteophytes are  seen at the endplates at Y4-I3, also unchanged. There are atherosclerotic calcifications of the aorta.   IMPRESSION: 1. No evidence for fracture or malalignment. 2. Stable moderate degenerative changes.   MRI cervical spine (11/26/21): FINDINGS: Alignment: No significant listhesis.   Vertebrae: No fracture, evidence of discitis, or bone lesion. Discogenic endplate marrow changes most pronounced at C6-7.   Cord: Normal signal and morphology.   Posterior Fossa, vertebral arteries, paraspinal tissues: Negative.   Disc levels:   C2-C3: Unremarkable.   C3-C4: Small disc osteophyte complex, slightly eccentric to the left. Unremarkable facet joints. Moderate left foraminal stenosis. No canal stenosis. Unchanged.   C4-C5: Minimal disc osteophyte complex. Mild facet hypertrophy. Mild bilateral foraminal stenosis. No canal stenosis. Unchanged.   C5-C6: Disc osteophyte complex with left uncovertebral spurring. Minimal facet hypertrophy on the left. Severe left and mild-moderate right foraminal stenosis. No significant canal stenosis. Unchanged.   C6-C7: Disc osteophyte complex with bilateral uncovertebral spurring resulting in moderate to severe left and moderate right foraminal stenosis. No canal stenosis. Unchanged.   C7-T1: Unremarkable.   IMPRESSION: 1. No significant interval progression of multilevel cervical spondylosis, as described above. No significant canal stenosis at any level. 2. Severe left foraminal stenosis at C5-6 and moderate-to-severe left foraminal stenosis at C6-7.   MRI brain wo contrast (03/18/21): FINDINGS: Brain: Mild  generalized atrophy with progression. Mild ventricular enlargement due to atrophy. Mild hyperintensity in the pons bilaterally likely due to chronic microvascular ischemia. Cerebral white matter normal. No hemorrhage or mass identified.   Vascular: Normal arterial flow voids.   Skull and upper cervical spine: No focal skeletal lesion  identified.   Sinuses/Orbits: Mild mucosal edema paranasal sinuses. Negative orbit   Other: None   IMPRESSION: No acute abnormality   Mild atrophy, progressive since 2016.   MRI lumbar spine (06/11/19): FINDINGS: Segmentation: The lowest lumbar type non-rib-bearing vertebra is labeled as L5.   Alignment:  No vertebral subluxation is observed.   Vertebrae: Type 2 degenerative endplate findings at F0-0 and to a lesser extent at L1-2 and L5-S1. Schmorl's node along the anterior inferior endplate of L1. Disc desiccation at L4-5 and L5-S1 with loss of disc height especially at L5-S1.   Conus medullaris and cauda equina: Conus extends to the L1 level. Conus and cauda equina appear normal.   Paraspinal and other soft tissues: A fluid signal intensity lesion of the left kidney is partially characterized on today's exam. This is statistically likely to be a cyst but not technically specific.   Disc levels:   L1-2: No impingement.  Diffuse disc bulge.   L2-3: Mild displacement of the right L2 nerve in the lateral extraforaminal space due to right lateral extraforaminal disc protrusion. Diffuse disc bulge.   L3-4: No impingement.  Diffuse disc bulge.   L4-5: Mild bilateral subarticular lateral recess stenosis due to disc bulge and mild left facet arthropathy.   L5-S1: Borderline bilateral foraminal stenosis due to intervertebral and facet spurring as well as disc bulge. Prior right laminectomy.   IMPRESSION: 1. Lumbar spondylosis and degenerative disc disease, causing mild impingement at L2-3 and L4-5 as detailed above.  ASSESSMENT: This is TARRIN LEBOW, a 66 y.o. 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

## 2022-12-04 ENCOUNTER — Ambulatory Visit (INDEPENDENT_AMBULATORY_CARE_PROVIDER_SITE_OTHER): Payer: Medicare HMO | Admitting: Family Medicine

## 2022-12-04 ENCOUNTER — Encounter: Payer: Self-pay | Admitting: Family Medicine

## 2022-12-04 VITALS — BP 114/70 | HR 66 | Temp 97.7°F | Resp 18 | Ht 71.0 in | Wt 201.0 lb

## 2022-12-04 DIAGNOSIS — E785 Hyperlipidemia, unspecified: Secondary | ICD-10-CM | POA: Diagnosis not present

## 2022-12-04 DIAGNOSIS — E1169 Type 2 diabetes mellitus with other specified complication: Secondary | ICD-10-CM | POA: Diagnosis not present

## 2022-12-04 DIAGNOSIS — I25119 Atherosclerotic heart disease of native coronary artery with unspecified angina pectoris: Secondary | ICD-10-CM

## 2022-12-04 DIAGNOSIS — E1165 Type 2 diabetes mellitus with hyperglycemia: Secondary | ICD-10-CM

## 2022-12-04 DIAGNOSIS — E1140 Type 2 diabetes mellitus with diabetic neuropathy, unspecified: Secondary | ICD-10-CM

## 2022-12-04 DIAGNOSIS — I700 Atherosclerosis of aorta: Secondary | ICD-10-CM | POA: Diagnosis not present

## 2022-12-04 DIAGNOSIS — M5136 Other intervertebral disc degeneration, lumbar region: Secondary | ICD-10-CM

## 2022-12-04 DIAGNOSIS — I25118 Atherosclerotic heart disease of native coronary artery with other forms of angina pectoris: Secondary | ICD-10-CM

## 2022-12-04 DIAGNOSIS — I10 Essential (primary) hypertension: Secondary | ICD-10-CM | POA: Diagnosis not present

## 2022-12-04 NOTE — Progress Notes (Signed)
Subjective:   By signing my name below, I, Shehryar Baig, attest that this documentation has been prepared under the direction and in the presence of [name], DO. 12/04/2022   Patient ID: Ronald Petersen, male    DOB: 08-28-1956, 66 y.o.   MRN: [id number]  Chief Complaint  Patient presents with   Diabetes   Hyperlipidemia   Hypertension   Follow-up    Concerns/ questions: some popping in the neck DM Eye exam: None recently.     Diabetes Pertinent negatives for hypoglycemia include no dizziness, headaches or nervousness/anxiousness. Pertinent negatives for diabetes include no chest pain and no weakness.  Hyperlipidemia Pertinent negatives include no chest pain, myalgias or shortness of breath.  Hypertension Pertinent negatives include no chest pain, headaches, malaise/fatigue, palpitations or shortness of breath.   Patient is in today for follow up visit.   He does not have a follow up scheduled with his endocrinologist and is not interested in scheduling an appointment. He is regularly checking his blood sugars at home and reports they typically measure below 100. When they measure higher than 100, he starts feeling ill. He continues taking 10 mg Jardiance daily PO and reports no new issues while taking it. He stopped taking it at one point but started taking it again.  Lab Results  Component Value Date   HGBA1C 7.2 (H) 06/03/2022   He does not have a flu vaccine this year and is not interested in receiving it at this time.    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   Cancer [facility])    Complication of anesthesia    aspirated with back surgery at age 63   Depression    Diabetes mellitus Type II    GERD (gastroesophageal reflux disease)    Hyperlipidemia    Hypertension    Neuromuscular disorder ([place])    NEUROPATHY   Right rotator cuff tear 11/23/2018   Sleep apnea    no  CPAP    Past Surgical History:  Procedure Laterality Date   APPENDECTOMY     ARTHOSCOPIC ROTAOR CUFF REPAIR Right 11/23/2018   Procedure: ARTHROSCOPIC ROTATOR CUFF REPAIR;  Surgeon: [name], MD;  Location: [place];  Service: Orthopedics;  Laterality: Right;   CHOLECYSTECTOMY     CORONARY ARTERY BYPASS GRAFT N/A 12/24/2021   x2 LIMA to LAD; SVG to Obtuse Marginal   CORONARY STENT INTERVENTION N/A 11/19/2020   Procedure: CORONARY STENT INTERVENTION;  Surgeon: Nelva Bush, MD;  Location: [place] CV LAB;  Service: Cardiovascular;  Laterality: N/A;   ENDOVEIN HARVEST OF GREATER SAPHENOUS VEIN Right 12/24/2021   Procedure: ENDOVEIN HARVEST OF GREATER SAPHENOUS VEIN;  Surgeon: Lajuana Matte, MD;  Location: [place];  Service: Open Heart Surgery;  Laterality: Right;   INTRAVASCULAR ULTRASOUND/IVUS N/A 11/19/2020   Procedure: Intravascular Ultrasound/IVUS;  Surgeon: Nelva Bush, MD;  Location: [place] [facility];  Service: Cardiovascular;  Laterality: N/A;   IR THORACENTESIS ASP PLEURAL SPACE W/IMG GUIDE  01/31/2022   LEFT HEART CATH AND CORONARY ANGIOGRAPHY N/A 11/19/2020   Procedure: LEFT HEART CATH AND CORONARY ANGIOGRAPHY;  Surgeon: Nelva Bush, MD;  Location: [place] CV LAB;  Service: Cardiovascular;  Laterality: N/A;   LEFT HEART CATH AND CORONARY ANGIOGRAPHY N/A 12/24/2020   Procedure: LEFT HEART CATH AND CORONARY ANGIOGRAPHY;  Surgeon: Martinique, Peter M,  MD;  Location: [place] CV LAB;  Service: Cardiovascular;  Laterality: N/A;   LEFT HEART CATH AND CORONARY ANGIOGRAPHY N/A 12/16/2021   Procedure: LEFT HEART CATH AND CORONARY ANGIOGRAPHY;  Surgeon: Leonie Man, MD;  Location: [place] [facility];  Service: Cardiovascular;  Laterality: N/A;   LUMBAR LAMINECTOMY     SHOULDER ARTHROSCOPY WITH ROTATOR CUFF REPAIR AND SUBACROMIAL DECOMPRESSION Right 11/23/2018   Procedure: SHOULDER ARTHROSCOPY WITH ROTATOR CUFF REPAIR AND SUBACROMIAL  DECOMPRESSION;  Surgeon: [name], MD;  Location: [place];  Service: Orthopedics;  Laterality: Right;   TEE WITHOUT CARDIOVERSION N/A 12/24/2021   Procedure: TRANSESOPHAGEAL ECHOCARDIOGRAM (TEE);  Surgeon: Lajuana Matte, MD;  Location: [place];  Service: Open Heart Surgery;  Laterality: N/A;    Family History  Problem Relation Age of Onset   COPD Mother    Stroke Father    Ovarian cancer Sister    Stomach cancer Sister    Heart disease Sister        MI   Heart disease Sister        MI   Stomach cancer Maternal Grandmother    Alcohol abuse Other    Depression Other    Arthritis Other    Hypertension Other    Coronary artery disease Other    Ovarian cancer Other        neice   Ovarian cancer Other        neice   Colon polyps Neg Hx    Colon cancer Neg Hx     Social History   Socioeconomic History   Marital status: Married    Spouse name: Lorriane Shire   Number of children: 2   Years of education: Not on file   Highest education level: Not on file  Occupational History   Occupation: self employed    Fish farm manager: UNEMPLOYED  Tobacco Use   Smoking status: Former    Years: 16.00    Types: Cigarettes    Quit date: 12/29/1985    Years since quitting: 36.9   Smokeless tobacco: Never  Vaping Use   Vaping Use: Never used  Substance and Sexual Activity   Alcohol use: Not Currently   Drug use: No   Sexual activity: Yes    Partners: Female  Other Topics Concern   Not on file  Social History Narrative   Exercise-- 3 days   Pt has hs degree   Right handed   One story home   Drinks caffeine   Social Determinants of Health   Financial Resource Strain: Low Risk  (02/04/2022)   Overall Financial Resource Strain (CARDIA)    Difficulty of Paying Living Expenses: Not hard at all  Food Insecurity: No Food Insecurity (02/04/2022)   Hunger Vital Sign    Worried About Running Out of Food in the Last Year: Never true    Ran Out of Food in the Last Year: Never  true  Transportation Needs: No Transportation Needs (02/04/2022)   PRAPARE - Hydrologist (Medical): No    Lack of Transportation (Non-Medical): No  Physical Activity: Sufficiently Active (02/04/2022)   Exercise Vital Sign    Days of Exercise per Week: 7 days    Minutes of Exercise per Session: 30 min  Stress: No Stress Concern Present (02/04/2022)   [place]    Feeling of Stress : Only a little  Social Connections: Moderately Integrated (02/04/2022)   Social Connection and  Isolation Panel [NHANES]    Frequency of Communication with Friends and Family: Three times a week    Frequency of Social Gatherings with Friends and Family: More than three times a week    Attends Religious Services: More than 4 times per year    Active Member of [employer] or Organizations: No    Attends Archivist Meetings: Never    Marital Status: Married  Human resources officer Violence: Not At Risk (02/04/2022)   Humiliation, Afraid, Rape, and Kick questionnaire    Fear of Current or Ex-Partner: No    Emotionally Abused: No    Physically Abused: No    Sexually Abused: No    Outpatient Medications Prior to Visit  Medication Sig Dispense Refill   acetaminophen (TYLENOL) 650 MG CR tablet Take 650 mg by mouth every 8 (eight) hours as needed for pain.     aspirin EC 325 MG EC tablet Take 1 tablet (325 mg total) by mouth daily.     ELDERBERRY PO Take 300 mg by mouth daily.     empagliflozin (JARDIANCE) 10 MG TABS tablet Take 1 tablet (10 mg total) by mouth daily. 30 tablet 3   famotidine (PEPCID) 20 MG tablet Take 1 tablet (20 mg total) by mouth daily. 30 tablet 2   fenofibrate 160 MG tablet TAKE 1 TABLET (160 MG TOTAL) BY MOUTH DAILY. 90 tablet 1   furosemide (LASIX) 40 MG tablet Take 1 tablet (40 mg total) by mouth daily. (Patient taking differently: Take 40 mg by mouth every other day.) 90 tablet 3   gabapentin (NEURONTIN)  800 MG tablet Take 1 tablet (800 mg total) by mouth 3 (three) times daily. 270 tablet 2   glucose blood (ONETOUCH VERIO) test strip USE AS INSTRUCTED TO CHECK BLOOD SUGAR ONCE A DAY 100 strip 12   Magnesium Citrate 200 MG TABS Take 400 mg by mouth daily.     ondansetron (ZOFRAN) 4 MG tablet Take 1 tablet (4 mg total) by mouth every 8 (eight) hours as needed. 20 tablet 2   pantoprazole (PROTONIX) 40 MG tablet Take 1 tablet (40 mg total) by mouth 2 (two) times daily. 60 tablet 3   ranolazine (RANEXA) 1000 MG SR tablet Take 1 tablet (1,000 mg total) by mouth 2 (two) times daily. 180 tablet 3   sertraline (ZOLOFT) 100 MG tablet TAKE 1 & 1/2 TABLET BY MOUTH ONCE DAILY 135 tablet 3   tiZANidine (ZANAFLEX) 4 MG tablet Take 1 tablet (4 mg total) by mouth every 6 (six) hours as needed for muscle spasms. 30 tablet 1   traZODone (DESYREL) 50 MG tablet Take 1 tablet (50 mg total) by mouth at bedtime as needed for sleep. 30 tablet 3   umeclidinium-vilanterol (ANORO ELLIPTA) 62.5-25 MCG/ACT AEPB Inhale 1 puff into the lungs daily. 60 each 3   vitamin E 180 MG (400 UNITS) capsule Take 1,600 Units by mouth daily.     potassium chloride SA (KLOR-CON M) 20 MEQ tablet Take 1 tablet by mouth daily for three days then resume taking 1 tablet every other day (Patient not taking: Reported on 12/04/2022) 90 tablet 3   No facility-administered medications prior to visit.    Allergies  Allergen Reactions   Niacin Anaphylaxis    Review of Systems  Constitutional:  Negative for chills, fever and malaise/fatigue.  HENT:  Negative for congestion and hearing loss.   Eyes:  Negative for discharge.  Respiratory:  Negative for cough, sputum production and shortness of breath.  Cardiovascular:  Negative for chest pain, palpitations and leg swelling.  Gastrointestinal:  Negative for abdominal pain, blood in stool, constipation, diarrhea, heartburn, nausea and vomiting.  Genitourinary:  Negative for dysuria, frequency,  hematuria and urgency.  Musculoskeletal:  Negative for back pain, falls and myalgias.  Skin:  Negative for rash.  Neurological:  Negative for dizziness, sensory change, loss of consciousness, weakness and headaches.  Endo/Heme/Allergies:  Negative for environmental allergies. Does not bruise/bleed easily.  Psychiatric/Behavioral:  Negative for depression and suicidal ideas. The patient is not nervous/anxious and does not have insomnia.        Objective:    Physical Exam Vitals and nursing note reviewed.  Constitutional:      General: He is not in acute distress.    Appearance: Normal appearance. He is not ill-appearing.  HENT:     Head: Normocephalic and atraumatic.     Right Ear: External ear normal.     Left Ear: External ear normal.  Eyes:     Extraocular Movements: Extraocular movements intact.     Pupils: Pupils are equal, round, and reactive to light.  Cardiovascular:     Rate and Rhythm: Normal rate and regular rhythm.     Heart sounds: Normal heart sounds. No murmur heard.    No gallop.  Pulmonary:     Effort: Pulmonary effort is normal. No respiratory distress.     Breath sounds: Normal breath sounds. No wheezing or rales.  Skin:    General: Skin is warm and dry.  Neurological:     Mental Status: He is alert and oriented to person, place, and time.  Psychiatric:        Judgment: Judgment normal.     BP 114/70 (BP Location: Left Arm, Patient Position: Sitting, Cuff Size: Normal)   Pulse 66   Temp 97.7 F (36.5 C) (Temporal)   Resp 18   Ht '5\' 11"'$  (1.803 m)   Wt 201 lb (91.2 kg)   SpO2 97%   BMI 28.03 kg/m  Wt Readings from Last 3 Encounters:  12/04/22 201 lb (91.2 kg)  10/22/22 198 lb (89.8 kg)  09/15/22 195 lb (88.5 kg)       Assessment & Plan:  Hyperlipidemia associated with type 2 diabetes mellitus (HCC) -     CBC with Differential/Platelet; Future -     Comprehensive metabolic panel; Future -     Hemoglobin A1c; Future -     Lipid panel;  Future -     Microalbumin / creatinine urine ratio; Future -     TSH; Future -     AMB Referral to Chronic Care Management Services  Type 2 diabetes mellitus with hyperglycemia, without long-term current use of insulin (HCC) -     CBC with Differential/Platelet; Future -     Comprehensive metabolic panel; Future -     Hemoglobin A1c; Future -     Lipid panel; Future -     Microalbumin / creatinine urine ratio; Future -     TSH; Future -     AMB Referral to Chronic Care Management Services  Essential hypertension -     CBC with Differential/Platelet; Future -     Comprehensive metabolic panel; Future -     Hemoglobin A1c; Future -     Lipid panel; Future -     Microalbumin / creatinine urine ratio; Future -     TSH; Future -     AMB Referral to Chronic Care Management Services  Coronary  artery disease involving native coronary artery of native heart with angina pectoris (HCC) -     CBC with Differential/Platelet; Future -     Comprehensive metabolic panel; Future -     Hemoglobin A1c; Future -     Lipid panel; Future -     Microalbumin / creatinine urine ratio; Future -     TSH; Future -     AMB Referral to Chronic Care Management Services    I, [place], DO, personally preformed the services described in this documentation.  All medical record entries made by the scribe were at my direction and in my presence.  I have reviewed the chart and discharge instructions (if applicable) and agree that the record reflects my personal performance and is accurate and complete. 12/04/2022   I,Shehryar Baig,acting as a scribe for [name], DO.,have documented all relevant documentation on the behalf of [name], DO,as directed by  [name], DO while in the presence of [name], DO.   [name], DO

## 2022-12-04 NOTE — Assessment & Plan Note (Signed)
Well controlled, no changes to meds. Encouraged heart healthy diet such as the DASH diet and exercise as tolerated.  °

## 2022-12-04 NOTE — Assessment & Plan Note (Signed)
Cont statin

## 2022-12-04 NOTE — Assessment & Plan Note (Signed)
Per ortho.  

## 2022-12-04 NOTE — Assessment & Plan Note (Signed)
Check labs  Con't statin  And meds per cardiology

## 2022-12-04 NOTE — Assessment & Plan Note (Signed)
hgba1c to be checked, minimize simple carbs. Increase exercise as tolerated. Continue current meds  

## 2022-12-05 ENCOUNTER — Ambulatory Visit: Payer: Medicare HMO | Admitting: Neurology

## 2022-12-08 ENCOUNTER — Other Ambulatory Visit: Payer: Medicare HMO

## 2022-12-09 ENCOUNTER — Other Ambulatory Visit (HOSPITAL_BASED_OUTPATIENT_CLINIC_OR_DEPARTMENT_OTHER): Payer: Self-pay

## 2022-12-09 ENCOUNTER — Other Ambulatory Visit (INDEPENDENT_AMBULATORY_CARE_PROVIDER_SITE_OTHER): Payer: Medicare HMO

## 2022-12-09 DIAGNOSIS — E1169 Type 2 diabetes mellitus with other specified complication: Secondary | ICD-10-CM

## 2022-12-09 DIAGNOSIS — E785 Hyperlipidemia, unspecified: Secondary | ICD-10-CM

## 2022-12-09 DIAGNOSIS — I25119 Atherosclerotic heart disease of native coronary artery with unspecified angina pectoris: Secondary | ICD-10-CM

## 2022-12-09 DIAGNOSIS — I10 Essential (primary) hypertension: Secondary | ICD-10-CM | POA: Diagnosis not present

## 2022-12-09 DIAGNOSIS — E1165 Type 2 diabetes mellitus with hyperglycemia: Secondary | ICD-10-CM

## 2022-12-09 LAB — COMPREHENSIVE METABOLIC PANEL
ALT: 14 U/L (ref 0–53)
AST: 16 U/L (ref 0–37)
Albumin: 4.7 g/dL (ref 3.5–5.2)
Alkaline Phosphatase: 62 U/L (ref 39–117)
BUN: 21 mg/dL (ref 6–23)
CO2: 32 mEq/L (ref 19–32)
Calcium: 10 mg/dL (ref 8.4–10.5)
Chloride: 101 mEq/L (ref 96–112)
Creatinine, Ser: 0.95 mg/dL (ref 0.40–1.50)
GFR: 82.86 mL/min (ref >60.00)
Glucose, Bld: 129 mg/dL — ABNORMAL HIGH (ref 70–99)
Potassium: 4.4 mEq/L (ref 3.5–5.1)
Sodium: 139 mEq/L (ref 135–145)
Total Bilirubin: 0.7 mg/dL (ref 0.2–1.2)
Total Protein: 7.5 g/dL (ref 6.0–8.3)

## 2022-12-09 LAB — HEMOGLOBIN A1C: Hgb A1c MFr Bld: 6.5 % (ref 4.6–6.5)

## 2022-12-09 LAB — MICROALBUMIN / CREATININE URINE RATIO
Creatinine,U: 49.8 mg/dL
Microalb Creat Ratio: 1.4 mg/g (ref 0.0–30.0)
Microalb, Ur: <0.7 mg/dL (ref 0.0–1.9)

## 2022-12-09 LAB — CBC WITH DIFFERENTIAL/PLATELET
Basophils Absolute: 0.1 10*3/uL (ref 0.0–0.1)
Basophils Relative: 0.9 % (ref 0.0–3.0)
Eosinophils Absolute: 0.2 10*3/uL (ref 0.0–0.7)
Eosinophils Relative: 2.5 % (ref 0.0–5.0)
HCT: 45.9 % (ref 39.0–52.0)
Hemoglobin: 15.4 g/dL (ref 13.0–17.0)
Lymphocytes Relative: 25 % (ref 12.0–46.0)
Lymphs Abs: 1.8 10*3/uL (ref 0.7–4.0)
MCHC: 33.5 g/dL (ref 30.0–36.0)
MCV: 84.4 fl (ref 78.0–100.0)
Monocytes Absolute: 0.6 10*3/uL (ref 0.1–1.0)
Monocytes Relative: 8.2 % (ref 3.0–12.0)
Neutro Abs: 4.5 10*3/uL (ref 1.4–7.7)
Neutrophils Relative %: 63.4 % (ref 43.0–77.0)
Platelets: 190 10*3/uL (ref 150.0–400.0)
RBC: 5.44 Mil/uL (ref 4.22–5.81)
RDW: 14.1 % (ref 11.5–15.5)
WBC: 7.1 10*3/uL (ref 4.0–10.5)

## 2022-12-09 LAB — LIPID PANEL
Cholesterol: 175 mg/dL (ref 0–200)
HDL: 45.4 mg/dL (ref >39.00)
LDL Cholesterol: 108 mg/dL — ABNORMAL HIGH (ref 0–99)
NonHDL: 129.85
Total CHOL/HDL Ratio: 4
Triglycerides: 109 mg/dL (ref 0.0–149.0)
VLDL: 21.8 mg/dL (ref 0.0–40.0)

## 2022-12-09 LAB — TSH: TSH: 1.48 u[IU]/mL (ref 0.35–5.50)

## 2022-12-11 ENCOUNTER — Other Ambulatory Visit: Payer: Self-pay

## 2022-12-11 ENCOUNTER — Ambulatory Visit: Payer: Medicare HMO | Admitting: Neurology

## 2022-12-16 ENCOUNTER — Other Ambulatory Visit (HOSPITAL_BASED_OUTPATIENT_CLINIC_OR_DEPARTMENT_OTHER): Payer: Self-pay

## 2022-12-16 ENCOUNTER — Other Ambulatory Visit: Payer: Self-pay

## 2022-12-16 ENCOUNTER — Other Ambulatory Visit: Payer: Self-pay | Admitting: Family Medicine

## 2022-12-16 MED ORDER — FAMOTIDINE 20 MG PO TABS
20.0000 mg | ORAL_TABLET | Freq: Every day | ORAL | 1 refills | Status: DC
  Filled 2022-12-16 – 2022-12-25 (×2): qty 90, 90d supply, fill #0
  Filled 2023-03-23: qty 90, 90d supply, fill #1

## 2022-12-23 ENCOUNTER — Other Ambulatory Visit (HOSPITAL_BASED_OUTPATIENT_CLINIC_OR_DEPARTMENT_OTHER): Payer: Self-pay

## 2022-12-25 ENCOUNTER — Other Ambulatory Visit: Payer: Self-pay

## 2022-12-25 ENCOUNTER — Other Ambulatory Visit (HOSPITAL_BASED_OUTPATIENT_CLINIC_OR_DEPARTMENT_OTHER): Payer: Self-pay

## 2022-12-25 ENCOUNTER — Other Ambulatory Visit: Payer: Self-pay | Admitting: Family Medicine

## 2022-12-25 DIAGNOSIS — G8929 Other chronic pain: Secondary | ICD-10-CM

## 2022-12-25 MED ORDER — TIZANIDINE HCL 4 MG PO TABS
4.0000 mg | ORAL_TABLET | Freq: Four times a day (QID) | ORAL | 1 refills | Status: DC | PRN
  Filled 2022-12-25: qty 30, 8d supply, fill #0
  Filled 2023-01-19: qty 30, 8d supply, fill #1

## 2023-01-08 ENCOUNTER — Telehealth: Payer: Self-pay

## 2023-01-08 ENCOUNTER — Telehealth: Payer: Self-pay | Admitting: *Deleted

## 2023-01-08 NOTE — Progress Notes (Signed)
  Care Coordination   Note   01/08/2023 Name: Ronald Petersen MRN: [id number] DOB: [birth year]  Ronald Petersen is a 67 y.o. year old male who sees [name], Alferd Apa, DO for primary care. I reached out to [name] by phone today to offer care coordination services.  [name] was given information about Care Coordination services today including:   The Care Coordination services include support from the care team which includes your Nurse Coordinator, Clinical Social Worker, or Pharmacist.  The Care Coordination team is here to help remove barriers to the health concerns and goals most important to you. Care Coordination services are voluntary, and the patient may decline or stop services at any time by request to their care team member.   Care Coordination Consent Status: Patient agreed to services and verbal consent obtained.   Follow up plan:  Telephone appointment with care coordination team member scheduled for:  01/19/2023  Encounter Outcome:  Pt. Scheduled   Julian Hy, [place] Direct Dial: [phone]

## 2023-01-09 NOTE — Progress Notes (Signed)
  Chronic Care Management   Note  01/09/2023 Name: Ronald Petersen MRN: [id number] DOB: 12/22/1956  Ronald Petersen is a 67 y.o. year old male who is a primary care patient of [name], DO. I reached out to [name] by phone today in response to a referral sent by [name] PCP.  The first contact attempt was unsuccessful.   Follow up plan: Additional outreach attempts will be made.  Noreene Larsson, [place], [place] [address] Direct Dial: [phone] [email or web address]

## 2023-01-09 NOTE — Progress Notes (Signed)
  Chronic Care Management   Note  01/09/2023 Name: Ronald Petersen MRN: [id number] DOB: 05/17/1956  Ronald Petersen is a 67 y.o. year old male who is a primary care patient of [name], DO. I reached out to [name] by phone today in response to a referral sent by Ronald Petersen PCP.  Ronald Petersen was given information about Chronic Care Management services today including:  CCM service includes personalized support from designated clinical staff supervised by the physician, including individualized plan of care and coordination with other care providers 24/7 contact phone numbers for assistance for urgent and routine care needs. Service will only be billed when office clinical staff spend 20 minutes or more in a month to coordinate care. Only one practitioner may furnish and bill the service in a calendar month. The patient may stop CCM services at amy time (effective at the end of the month) by phone call to the office staff. The patient will be responsible for cost sharing (co-pay) or up to 20% of the service fee (after annual deductible is met)  Ronald Petersen  agreedto scheduling an appointment with the CCM RN Case Manager and Pharm d   Follow up plan: Patient agreed to scheduled appointment with RN Case Manager on 01/15/2023 Pharm D 01/20/2023(date/time).   Noreene Larsson, [place], [place] [address] Direct Dial: [phone] [email or web address]

## 2023-01-12 ENCOUNTER — Other Ambulatory Visit: Payer: Self-pay | Admitting: Cardiology

## 2023-01-12 ENCOUNTER — Other Ambulatory Visit (HOSPITAL_BASED_OUTPATIENT_CLINIC_OR_DEPARTMENT_OTHER): Payer: Self-pay

## 2023-01-12 MED ORDER — EMPAGLIFLOZIN 10 MG PO TABS
10.0000 mg | ORAL_TABLET | Freq: Every day | ORAL | 3 refills | Status: DC
  Filled 2023-01-12 (×2): qty 30, 30d supply, fill #0
  Filled 2023-02-10: qty 30, 30d supply, fill #1

## 2023-01-13 ENCOUNTER — Other Ambulatory Visit: Payer: Self-pay

## 2023-01-13 ENCOUNTER — Other Ambulatory Visit (HOSPITAL_BASED_OUTPATIENT_CLINIC_OR_DEPARTMENT_OTHER): Payer: Self-pay

## 2023-01-13 NOTE — Progress Notes (Deleted)
I saw Ronald Petersen in neurology clinic on 01/16/23 in follow up for neuropathy.  HPI: Ronald Petersen is a 67 y.o. year old right-handed male with a medical history of DM2, HTN, HLD, CAD s/p CABG (11/2021), back surgery (at age 73, cannot remember the lumbar levels), OA, anxiety, right rotator cuff tear, OSA (not on CPAP), and prior EtOH abuse who we last saw on 09/05/22.  To briefly review: Patient is having shooting pains in his feet shooting into his toes. His left leg is worse. He endorses numbness and tingling that extends to his calves. The tingling in his feet is near constant. He has arthritis in his hands and sometimes has tingling in his hands, but not as bad as legs. He still has significant back and neck pain (see previous history below). When he walks, he looks like a drunk person. He does not use an assistive device. He has not fallen in the last 3-4 months, but has lost balance and fallen this year (he thinks it is less than 5 times this year). He endorses weakness in the legs. He has difficulty going up and down stairs and feels like he trips over his own feet (?foot drop). He thinks most of the falls are related to not feeling his feet.   The patient denies symptoms suggestive of oculobulbar weakness including diplopia, ptosis, dysphagia, poor saliva control, dysarthria/dysphonia, impaired mastication, facial weakness/droop.   The patient does not report symptoms referable to autonomic dysfunction including impaired sweating, heat or cold intolerance, excessive mucosal dryness, gastroparetic early satiety, postprandial abdominal bloating, or syncope/presyncope/orthostatic intolerance. He does endorse some bowel continence. He denies saddle anaesthesia.   The patient denies any constitutional symptoms like fever, night sweats, anorexia or unintentional weight loss.   EtOH use: Patient has not drank EtOH in 2 years. Previously was a heavier drinker (4-5 beers per day) Restrictive  diet? No Family history of neuropathy/myopathy/NM disease? Sister had neuropathy (did not have diabetes)   Patient was seen by PCP, Dr. [name], on 06/03/22 and mentioned neuropathy, leg weakness, and falls. Lumbar xrays were ordered and patient was referred to neurology for further evaluation.   Previously patient has seen Dr. Tomi Likens in the past (most recently on 02/26/21 for cognitive complaints and in 2020 for leg weakness). Per Dr. Georgie Chard clinic note on 06/03/2019: Patient has history of chronic neck and back pain with history of lumbar laminectomy.  He is on multiple medications for chronic pain.  He reports radicular pain down the back of his legs when he walks.  Lumbar spine X-ray from 01/04/19 personally reviewed and demonstrated multilevel arthritic changes with moderate disc space narrowing at L4-5 and L5-S1 as well as facet arthritic changes at L3-4, L4-5 and L5-S1 bilaterally and anterior osteophytes at L4, L5, and S1.  He also reports worsening neck pain as well, radiating down both sides and into his shoulders.  Neck pain is followed by Dr. Lorin Mercy.  Due to chronic neck pain, cervical X-ray from 05/20/19 showed spondylosis most prominent at C5-6 and C6-7.  Over the past year, he reports worsening weakness and pain in the legs.  He also reports weakness in the upper extremities as well.  He has significant numbness in the legs as well as in the hands radiating up the arms.  He was diagnosed with severe carpal tunnel syndrome.  Labs from February include B12 342 and low B1 of 7.  He has a history of alcohol dependence and reports that he  was drinking about 6 beers every other day up until 3 months ago.  Hgb A1c from 05/20/19 was elevated at 9.7.  About a month ago, he developed positional vertigo described as spinning sensation lasting seconds with change in head movement.   Per patient, he has not been cleared by cardiology for neck or back surgery.    Patient has never had an EMG.   Patient is  currently on gabapentin 800 mg. He is prescribed TID, but usually takes it BID, sometimes TID (based on if he remembers). He is not sure if it is helping. Patient is currently on Zoloft for depression (per wife this works well). He is taking B12, but is not sure how much.   He does not remember previously tried medications and does not think he has ever tried topical treatment. He has previously been recommended for PT, but his co-pay and ability to pay has not allowed him to go.   Patient would like to know where the weakness is coming from and if there is anything that can help.  Most recent Assessment and Plan (09/05/22): His neurological examination is pertinent for hyporeflexia and sensory loss in bilateral legs in a distal to proximal gradient. He also has gait imbalance with wide base and positive Romberg. Available diagnostic data is significant for MRI brain with mild atrophy for age, MRI cervical spine with multilevel stenosis and severe foraminal stenosis, MRI lumbar spine with lumbar spondylosis. Previous labs have been significant for low B1, B12, and elevated HbA1c to 7.    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 Patient has some financial constraints, so work up was discussed and tailored to his situation. In addition, financial assistance information through [facility] was given. I have made the appropriate referrals, including PT that patient could not previously afford, understanding that all testing may not be complete. We will prioritize lab testing to look for other treatable causes of neuropathy.   PLAN: -Blood work: B12, MMA, B1, vit E, copper, IFE, SPEP -Patient to get in touch with me about how much B12 he is currently taking -Discussed DM mgmt and staying off EtOH to prevent worsening  neuropathy -Referral to PT - Financial assistance info given -Discussed EMG - concerned about cost - will order in [facility] EMG. Patient will defer if cost prohibitive -Continue gabapentin 800 mg TID for now. May need to reduce if concern for ataxia or if another medication is desired. -Recommended topical Lidocaine cream over the counter for tingling  Since their last visit: Labs were unremarkable. EMG showed a mild polyneuropathy, severe carpal tunnel syndrome, and an old right C8 radiculopathy. There was no definitive electrodiagnostic evidence of a lumbosacral radiculopathy. A cock up wrist splint was recommended for his carpal tunnel symptoms. ***  Did patient ever go to PT?***  ROS: Pertinent positive and negative systems reviewed in HPI. ***   MEDICATIONS:  Outpatient Encounter Medications as of 01/16/2023  Medication Sig   acetaminophen (TYLENOL) 650 MG CR tablet Take 650 mg by mouth every 8 (eight) hours as needed for pain.   aspirin EC 325 MG EC tablet Take 1 tablet (325 mg total) by mouth daily.   ELDERBERRY PO Take 300 mg by mouth daily.   empagliflozin (JARDIANCE) 10 MG TABS tablet Take 1 tablet (10 mg total) by mouth daily.   famotidine (PEPCID) 20 MG tablet  Take 1 tablet (20 mg total) by mouth daily.   fenofibrate 160 MG tablet TAKE 1 TABLET (160 MG TOTAL) BY MOUTH DAILY.   furosemide (LASIX) 40 MG tablet Take 1 tablet (40 mg total) by mouth daily. (Patient taking differently: Take 40 mg by mouth every other day.)   gabapentin (NEURONTIN) 800 MG tablet Take 1 tablet (800 mg total) by mouth 3 (three) times daily.   glucose blood (ONETOUCH VERIO) test strip USE AS INSTRUCTED TO CHECK BLOOD SUGAR ONCE A DAY   Magnesium Citrate 200 MG TABS Take 400 mg by mouth daily.   ondansetron (ZOFRAN) 4 MG tablet Take 1 tablet (4 mg total) by mouth every 8 (eight) hours as needed.   pantoprazole (PROTONIX) 40 MG tablet Take 1 tablet (40 mg total) by mouth 2 (two) times daily.   ranolazine  (RANEXA) 1000 MG SR tablet Take 1 tablet (1,000 mg total) by mouth 2 (two) times daily.   sertraline (ZOLOFT) 100 MG tablet TAKE 1 & 1/2 TABLET BY MOUTH ONCE DAILY   tiZANidine (ZANAFLEX) 4 MG tablet Take 1 tablet (4 mg total) by mouth every 6 (six) hours as needed for muscle spasms.   traZODone (DESYREL) 50 MG tablet Take 1 tablet (50 mg total) by mouth at bedtime as needed for sleep.   umeclidinium-vilanterol (ANORO ELLIPTA) 62.5-25 MCG/ACT AEPB Inhale 1 puff into the lungs daily.   vitamin E 180 MG (400 UNITS) capsule Take 1,600 Units by mouth daily.   [DISCONTINUED] empagliflozin (JARDIANCE) 10 MG TABS tablet Take 1 tablet (10 mg total) by mouth daily.   No facility-administered encounter medications on file as of 01/16/2023.    PAST MEDICAL HISTORY: 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   Cancer [facility])    Complication of anesthesia    aspirated with back surgery at age 27   Depression    Diabetes mellitus Type II    GERD (gastroesophageal reflux disease)    Hyperlipidemia    Hypertension    Neuromuscular disorder ([place])    NEUROPATHY   Right rotator cuff tear 11/23/2018   Sleep apnea    no CPAP    PAST SURGICAL HISTORY: Past Surgical History:  Procedure Laterality Date   APPENDECTOMY     ARTHOSCOPIC ROTAOR CUFF REPAIR Right 11/23/2018   Procedure: ARTHROSCOPIC ROTATOR CUFF REPAIR;  Surgeon: [name], MD;  Location: [place];  Service: Orthopedics;  Laterality: Right;   CHOLECYSTECTOMY     CORONARY ARTERY BYPASS GRAFT N/A 12/24/2021   x2 LIMA to LAD; SVG to Obtuse Marginal   CORONARY STENT INTERVENTION N/A 11/19/2020   Procedure: CORONARY STENT INTERVENTION;  Surgeon: Nelva Bush, MD;  Location: [place] [facility];  Service: Cardiovascular;  Laterality: N/A;   ENDOVEIN HARVEST OF GREATER SAPHENOUS VEIN Right 12/24/2021   Procedure: ENDOVEIN HARVEST OF GREATER  SAPHENOUS VEIN;  Surgeon: Lajuana Matte, MD;  Location: [place];  Service: Open Heart Surgery;  Laterality: Right;   INTRAVASCULAR ULTRASOUND/IVUS N/A 11/19/2020   Procedure: Intravascular Ultrasound/IVUS;  Surgeon: Nelva Bush, MD;  Location: [place] [facility];  Service: Cardiovascular;  Laterality: N/A;   IR THORACENTESIS ASP PLEURAL SPACE W/IMG GUIDE  01/31/2022   LEFT HEART CATH AND CORONARY ANGIOGRAPHY N/A 11/19/2020   Procedure: LEFT HEART CATH AND CORONARY ANGIOGRAPHY;  Surgeon: Nelva Bush, MD;  Location: [place] [facility];  Service: Cardiovascular;  Laterality: N/A;   LEFT HEART CATH AND CORONARY ANGIOGRAPHY N/A 12/24/2020   Procedure: LEFT HEART CATH AND CORONARY ANGIOGRAPHY;  Surgeon: Martinique, Peter M, MD;  Location: [place] [facility];  Service: Cardiovascular;  Laterality: N/A;   LEFT HEART CATH AND CORONARY ANGIOGRAPHY N/A 12/16/2021   Procedure: LEFT HEART CATH AND CORONARY ANGIOGRAPHY;  Surgeon: Leonie Man, MD;  Location: [place] [facility];  Service: Cardiovascular;  Laterality: N/A;   LUMBAR LAMINECTOMY     SHOULDER ARTHROSCOPY WITH ROTATOR CUFF REPAIR AND SUBACROMIAL DECOMPRESSION Right 11/23/2018   Procedure: SHOULDER ARTHROSCOPY WITH ROTATOR CUFF REPAIR AND SUBACROMIAL DECOMPRESSION;  Surgeon: [name], MD;  Location: [place];  Service: Orthopedics;  Laterality: Right;   TEE WITHOUT CARDIOVERSION N/A 12/24/2021   Procedure: TRANSESOPHAGEAL ECHOCARDIOGRAM (TEE);  Surgeon: Lajuana Matte, MD;  Location: [place];  Service: Open Heart Surgery;  Laterality: N/A;    ALLERGIES: Allergies  Allergen Reactions   Niacin Anaphylaxis    FAMILY HISTORY: Family History  Problem Relation Age of Onset   COPD Mother    Stroke Father    Ovarian cancer Sister    Stomach cancer Sister    Heart disease Sister        MI   Heart disease Sister        MI   Stomach cancer Maternal Grandmother    Alcohol abuse Other    Depression Other     Arthritis Other    Hypertension Other    Coronary artery disease Other    Ovarian cancer Other        neice   Ovarian cancer Other        neice   Colon polyps Neg Hx    Colon cancer Neg Hx     SOCIAL HISTORY: Social History   Tobacco Use   Smoking status: Former    Years: 16.00    Types: Cigarettes    Quit date: 12/29/1985    Years since quitting: 37.0   Smokeless tobacco: Never  Vaping Use   Vaping Use: Never used  Substance Use Topics   Alcohol use: Not Currently   Drug use: No   Social History   Social History Narrative   Exercise-- 3 days   Pt has hs degree   Right handed   One story home   Drinks caffeine    Objective:  Vital Signs:  There were no vitals taken for this visit.  General:*** General appearance: Awake and alert. No distress. Cooperative with exam.  Skin: No obvious rash or jaundice. HEENT: Atraumatic. Anicteric. Lungs: Non-labored breathing on room air  Heart: Regular Abdomen: Soft, non tender. Extremities: No edema. No obvious deformity.  Musculoskeletal: No obvious joint swelling.  Neurological: Mental Status: Alert. Speech fluent. No pseudobulbar affect Cranial Nerves: CNII: No RAPD. Visual Brandel intact. CNIII, IV, VI: PERRL. No nystagmus. EOMI. CN V: Facial sensation intact bilaterally to fine touch. [place] clench strong. Jaw jerk***. CN VII: Facial muscles symmetric and strong. No ptosis at rest or after sustained upgaze***. CN VIII: Hears finger rub well bilaterally. CN IX: No hypophonia. CN X: Palate elevates symmetrically. CN XI: Full strength shoulder shrug bilaterally. CN XII: Tongue protrusion full and midline. No atrophy or fasciculations. No significant dysarthria*** Motor: Tone is ***. *** fasciculations in *** extremities. *** atrophy. No grip or percussive myotonia.  Individual muscle group testing (MRC grade out of 5):  Movement     Neck flexion ***  Neck extension ***     Right Left   Shoulder  abduction *** ***   Shoulder adduction *** ***   Shoulder ext rotation *** ***   Shoulder int rotation *** ***   Elbow flexion *** ***   Elbow extension *** ***   Wrist extension *** ***   Wrist flexion *** ***   Finger abduction - FDI *** ***   Finger abduction - ADM *** ***   Finger extension *** ***   Finger distal flexion - 2/3 *** ***   Finger distal flexion - 4/5 *** ***   Thumb flexion - FPL *** ***   Thumb abduction - APB *** ***    Hip flexion *** ***   Hip extension *** ***   Hip adduction *** ***   Hip abduction *** ***   Knee extension *** ***   Knee flexion *** ***   Dorsiflexion *** ***   Plantarflexion *** ***   Inversion *** ***   Eversion *** ***   Great toe extension *** ***   Great toe flexion *** ***     Reflexes:  Right Left  Bicep *** ***  Tricep *** ***  BrRad *** ***  Knee *** ***  Ankle *** ***   Pathological Reflexes: Babinski: *** response bilaterally*** Hoffman: *** Troemner: *** Pectoral: *** Palmomental: *** Facial: *** Midline tap: *** Sensation: Pinprick: *** Vibration: *** Temperature: *** Proprioception: *** Coordination: Intact finger-to- nose-finger and heel-to-shin bilaterally. Romberg negative.*** Gait: Able to rise from chair with arms crossed unassisted. Normal, narrow-based gait. Able to tandem walk. Able to walk on toes and heels.***   Lab and Test Review: New results: 09/05/22: Normal or unremarkable: B12 (> 1500), B1, vit E, MMA, copper, IFE, SPEP  12/09/22: A1c: 6.5  EMG (10/27/22): NCV & EMG Findings: Extensive electrodiagnostic evaluation of the right upper and lower limbs shows: Absent right sural, superficial fibular, and median sensory responses. Right ulnar and radial sensory responses are within normal limits. Right tibial (AH) motor response shows reduced amplitude (2.6 mV). Right median (APB) motor response shows prolonged distal onset latency (7.1 ms). Right fibular (EDB) and ulnar (ADM) motor  responses are within normal limits. Right H reflex is absent. Chronic motor axon loss changes without accompanying active denervation changes are seen in the right tibialis anterior, medial head of gastrocnemius, first dorsal interosseous, and extensor indicis proprius muscles.   Impression: This is an abnormal electrodiagnostic evaluation. The findings are most consistent with the following: Evidence of a generalized sensorimotor polyneuropathy, axon loss in type, mild in degree electrically. Evidence of a right median mononeuropathy at or distal to the wrist, consistent with carpal tunnel syndrome, severe in degree electrically. The residuals of an old intraspinal canal lesion (ie: motor radiculopathy) at the right C8 root, mild in degree electrically. No definite evidence of a right lumbosacral (L3-S1) motor radiculopathy.  Previously reviewed results: Internal labs: Normal or unremarkable: TSH, lipid panel, CBC, CMP, HIV HbA1c (06/03/22): 7.2 B12 (02/26/21): 306 B1 (02/26/21): 13 Vit D (01/17/21): 42   Lumbar spine xray (06/03/22): FINDINGS: There is no evidence of lumbar spine fracture. Alignment is normal. There is moderate disc space narrowing at L4-L5 and L5-S1 with endplate osteophyte formation, similar to the prior study. Degenerative osteophytes are seen at the endplates at 075-GRM, also unchanged. There are atherosclerotic calcifications of the aorta.   IMPRESSION: 1. No evidence for fracture or malalignment. 2. Stable moderate degenerative changes.   MRI cervical spine (11/26/21): FINDINGS: Alignment: No significant  listhesis.   Vertebrae: No fracture, evidence of discitis, or bone lesion. Discogenic endplate marrow changes most pronounced at C6-7.   Cord: Normal signal and morphology.   Posterior Fossa, vertebral arteries, paraspinal tissues: Negative.   Disc levels:   C2-C3: Unremarkable.   C3-C4: Small disc osteophyte complex, slightly eccentric to the left.  Unremarkable facet joints. Moderate left foraminal stenosis. No canal stenosis. Unchanged.   C4-C5: Minimal disc osteophyte complex. Mild facet hypertrophy. Mild bilateral foraminal stenosis. No canal stenosis. Unchanged.   C5-C6: Disc osteophyte complex with left uncovertebral spurring. Minimal facet hypertrophy on the left. Severe left and mild-moderate right foraminal stenosis. No significant canal stenosis. Unchanged.   C6-C7: Disc osteophyte complex with bilateral uncovertebral spurring resulting in moderate to severe left and moderate right foraminal stenosis. No canal stenosis. Unchanged.   C7-T1: Unremarkable.   IMPRESSION: 1. No significant interval progression of multilevel cervical spondylosis, as described above. No significant canal stenosis at any level. 2. Severe left foraminal stenosis at C5-6 and moderate-to-severe left foraminal stenosis at C6-7.   MRI brain wo contrast (03/18/21): FINDINGS: Brain: Mild generalized atrophy with progression. Mild ventricular enlargement due to atrophy. Mild hyperintensity in the pons bilaterally likely due to chronic microvascular ischemia. Cerebral white matter normal. No hemorrhage or mass identified.   Vascular: Normal arterial flow voids.   Skull and upper cervical spine: No focal skeletal lesion identified.   Sinuses/Orbits: Mild mucosal edema paranasal sinuses. Negative orbit   Other: None   IMPRESSION: No acute abnormality   Mild atrophy, progressive since 2016.   MRI lumbar spine (06/11/19): FINDINGS: Segmentation: The lowest lumbar type non-rib-bearing vertebra is labeled as L5.   Alignment:  No vertebral subluxation is observed.   Vertebrae: Type 2 degenerative endplate findings at 075-GRM and to a lesser extent at L1-2 and L5-S1. Schmorl's node along the anterior inferior endplate of L1. Disc desiccation at L4-5 and L5-S1 with loss of disc height especially at L5-S1.   Conus medullaris and cauda equina:  Conus extends to the L1 level. Conus and cauda equina appear normal.   Paraspinal and other soft tissues: A fluid signal intensity lesion of the left kidney is partially characterized on today's exam. This is statistically likely to be a cyst but not technically specific.   Disc levels:   L1-2: No impingement.  Diffuse disc bulge.   L2-3: Mild displacement of the right L2 nerve in the lateral extraforaminal space due to right lateral extraforaminal disc protrusion. Diffuse disc bulge.   L3-4: No impingement.  Diffuse disc bulge.   L4-5: Mild bilateral subarticular lateral recess stenosis due to disc bulge and mild left facet arthropathy.   L5-S1: Borderline bilateral foraminal stenosis due to intervertebral and facet spurring as well as disc bulge. Prior right laminectomy.   IMPRESSION: 1. Lumbar spondylosis and degenerative disc disease, causing mild impingement at L2-3 and L4-5 as detailed above.  ASSESSMENT: This is Ronald Petersen, a 67 y.o. 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 May need to reduce if concern for ataxia or if another medication is desired. -Topical Lidocaine cream over the counter for tingling  Return to clinic in ***  Total time spent reviewing records, interview, history/exam, documentation, and coordination of care on day of encounter:  *** min  Kai Levins, MD

## 2023-01-15 ENCOUNTER — Telehealth: Payer: Medicare HMO

## 2023-01-16 ENCOUNTER — Ambulatory Visit: Payer: Medicare HMO | Admitting: Neurology

## 2023-01-16 NOTE — Progress Notes (Unsigned)
I saw Ronald Petersen in neurology clinic on 01/22/23 in follow up for neuropathy.  HPI: Ronald Petersen is a 67 y.o. year old male with a history of DM2, HTN, HLD, CAD s/p CABG (11/2021), back surgery (at age 73, cannot remember the lumbar levels), OA, anxiety, right rotator cuff tear, OSA (not on CPAP), and prior EtOH abuse who we last saw on 09/05/22.   To briefly review:  Patient is having shooting pains in his feet shooting into his toes. His left leg is worse. He endorses numbness and tingling that extends to his calves. The tingling in his feet is near constant. He has arthritis in his hands and sometimes has tingling in his hands, but not as bad as legs. He still has significant back and neck pain (see previous history below). When he walks, he looks like a drunk person. He does not use an assistive device. He has not fallen in the last 3-4 months, but has lost balance and fallen this year (he thinks it is less than 5 times this year). He endorses weakness in the legs. He has difficulty going up and down stairs and feels like he trips over his own feet (?foot drop). He thinks most of the falls are related to not feeling his feet.   The patient denies symptoms suggestive of oculobulbar weakness including diplopia, ptosis, dysphagia, poor saliva control, dysarthria/dysphonia, impaired mastication, facial weakness/droop.   The patient does not report symptoms referable to autonomic dysfunction including impaired sweating, heat or cold intolerance, excessive mucosal dryness, gastroparetic early satiety, postprandial abdominal bloating, or syncope/presyncope/orthostatic intolerance. He does endorse some bowel continence. He denies saddle anaesthesia.   The patient denies any constitutional symptoms like fever, night sweats, anorexia or unintentional weight loss.   EtOH use: Patient has not drank EtOH in 2 years. Previously was a heavier drinker (4-5 beers per day) Restrictive diet? No Family  history of neuropathy/myopathy/NM disease? Sister had neuropathy (did not have diabetes)   Patient was seen by PCP, Dr. [name], on 06/03/22 and mentioned neuropathy, leg weakness, and falls. Lumbar xrays were ordered and patient was referred to neurology for further evaluation.   Previously patient has seen Dr. Tomi Likens in the past (most recently on 02/26/21 for cognitive complaints and in 2020 for leg weakness). Per Dr. Georgie Chard clinic note on 06/03/2019: Patient has history of chronic neck and back pain with history of lumbar laminectomy.  He is on multiple medications for chronic pain.  He reports radicular pain down the back of his legs when he walks.  Lumbar spine X-ray from 01/04/19 personally reviewed and demonstrated multilevel arthritic changes with moderate disc space narrowing at L4-5 and L5-S1 as well as facet arthritic changes at L3-4, L4-5 and L5-S1 bilaterally and anterior osteophytes at L4, L5, and S1.  He also reports worsening neck pain as well, radiating down both sides and into his shoulders.  Neck pain is followed by Dr. Lorin Mercy.  Due to chronic neck pain, cervical X-ray from 05/20/19 showed spondylosis most prominent at C5-6 and C6-7.  Over the past year, he reports worsening weakness and pain in the legs.  He also reports weakness in the upper extremities as well.  He has significant numbness in the legs as well as in the hands radiating up the arms.  He was diagnosed with severe carpal tunnel syndrome.  Labs from February include B12 342 and low B1 of 7.  He has a history of alcohol dependence and reports that he  was drinking about 6 beers every other day up until 3 months ago.  Hgb A1c from 05/20/19 was elevated at 9.7.  About a month ago, he developed positional vertigo described as spinning sensation lasting seconds with change in head movement.   Per patient, he has not been cleared by cardiology for neck or back surgery.    Patient has never had an EMG.   Patient is currently on  gabapentin 800 mg. He is prescribed TID, but usually takes it BID, sometimes TID (based on if he remembers). He is not sure if it is helping. Patient is currently on Zoloft for depression (per wife this works well). He is taking B12, but is not sure how much.   He does not remember previously tried medications and does not think he has ever tried topical treatment. He has previously been recommended for PT, but his co-pay and ability to pay has not allowed him to go.   Patient would like to know where the weakness is coming from and if there is anything that can help.   Most recent Assessment and Plan (09/05/22): His neurological examination is pertinent for hyporeflexia and sensory loss in bilateral legs in a distal to proximal gradient. He also has gait imbalance with wide base and positive Romberg. Available diagnostic data is significant for MRI brain with mild atrophy for age, MRI cervical spine with multilevel stenosis and severe foraminal stenosis, MRI lumbar spine with lumbar spondylosis. Previous labs have been significant for low B1, B12, and elevated HbA1c to 7.    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 Patient has some financial constraints, so work up was discussed and tailored to his situation. In addition, financial assistance information through [facility] was given. I have made the appropriate referrals, including PT that patient could not previously afford, understanding that all testing may not be complete. We will prioritize lab testing to look for other treatable causes of neuropathy.   PLAN: -Blood work: B12, MMA, B1, vit E, copper, IFE, SPEP -Patient to get in touch with me about how much B12 he is currently taking -Discussed DM mgmt and staying off EtOH to prevent worsening neuropathy -Referral  to PT - Financial assistance info given -Discussed EMG - concerned about cost - will order in [facility] EMG. Patient will defer if cost prohibitive -Continue gabapentin 800 mg TID for now. May need to reduce if concern for ataxia or if another medication is desired. -Recommended topical Lidocaine cream over the counter for tingling   Since their last visit: Labs were unremarkable. EMG showed a mild polyneuropathy, severe carpal tunnel syndrome, and an old right C8 radiculopathy. There was no definitive electrodiagnostic evidence of a lumbosacral radiculopathy. Patient gets a lot of pain and numbness/tingling in his hands that was not previously mentioned. A cock up wrist splint was recommended for his carpal tunnel symptoms. He did not wear wrist splints.   PT was recommended but did not feel it would be that helpful, so he did not do PT.  His symptoms are similar to prior. He does feel like he is having trouble picking his legs up. He thinks he walks like "an old man." He denies recent falls and continues to ambulate independently and is independent of ADLs.  He continues to take gabapentin 800 mg BID to TID which he thinks helps with his pains.  MEDICATIONS:  Outpatient Encounter Medications as of 01/22/2023  Medication Sig   acetaminophen (TYLENOL) 650 MG CR tablet Take 650 mg by mouth every 8 (eight) hours as needed for pain.   aspirin EC 325 MG EC tablet Take 1 tablet (325 mg total) by mouth daily.   ELDERBERRY PO Take 300 mg by mouth daily.   empagliflozin (JARDIANCE) 10 MG TABS tablet Take 1 tablet (10 mg total) by mouth daily.   famotidine (PEPCID) 20 MG tablet Take 1 tablet (20 mg total) by mouth daily.   fenofibrate 160 MG tablet TAKE 1 TABLET (160 MG TOTAL) BY MOUTH DAILY.   furosemide (LASIX) 40 MG tablet Take 1 tablet (40 mg total) by mouth daily. (Patient taking differently: Take 40 mg by mouth every other day.)   gabapentin (NEURONTIN) 800 MG tablet Take 1 tablet (800 mg total) by  mouth 3 (three) times daily.   glucose blood (ONETOUCH VERIO) test strip USE AS INSTRUCTED TO CHECK BLOOD SUGAR ONCE A DAY   Magnesium Citrate 200 MG TABS Take 400 mg by mouth daily.   ondansetron (ZOFRAN) 4 MG tablet Take 1 tablet (4 mg total) by mouth every 8 (eight) hours as needed.   pantoprazole (PROTONIX) 40 MG tablet Take 1 tablet (40 mg total) by mouth 2 (two) times daily.   ranolazine (RANEXA) 1000 MG SR tablet Take 1 tablet (1,000 mg total) by mouth 2 (two) times daily.   sertraline (ZOLOFT) 100 MG tablet TAKE 1 & 1/2 TABLET BY MOUTH ONCE DAILY   tiZANidine (ZANAFLEX) 4 MG tablet Take 1 tablet (4 mg total) by mouth every 6 (six) hours as needed for muscle spasms.   traZODone (DESYREL) 50 MG tablet Take 1 tablet (50 mg total) by mouth at bedtime as needed for sleep.   umeclidinium-vilanterol (ANORO ELLIPTA) 62.5-25 MCG/ACT AEPB Inhale 1 puff into the lungs daily.   vitamin E 180 MG (400 UNITS) capsule Take 1,600 Units by mouth daily.   No facility-administered encounter medications on file as of 01/22/2023.    PAST MEDICAL HISTORY: 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   Cancer [facility])    Complication of anesthesia    aspirated with back surgery at age 40   Depression    Diabetes mellitus Type II    GERD (gastroesophageal reflux disease)    Hyperlipidemia    Hypertension    Neuromuscular disorder ([place])    NEUROPATHY   Right rotator cuff tear 11/23/2018   Sleep apnea    no CPAP    PAST SURGICAL HISTORY: Past Surgical History:  Procedure Laterality Date   APPENDECTOMY     ARTHOSCOPIC ROTAOR CUFF REPAIR Right 11/23/2018   Procedure: ARTHROSCOPIC ROTATOR CUFF REPAIR;  Surgeon: [name], MD;  Location: [place];  Service: Orthopedics;  Laterality: Right;   CHOLECYSTECTOMY     CORONARY ARTERY BYPASS GRAFT N/A 12/24/2021   x2 LIMA to LAD; SVG to Obtuse Marginal    CORONARY STENT INTERVENTION N/A 11/19/2020   Procedure: CORONARY STENT INTERVENTION;  Surgeon: Nelva Bush, MD;  Location: [place] [facility];  Service: Cardiovascular;  Laterality: N/A;   ENDOVEIN HARVEST OF GREATER SAPHENOUS VEIN Right 12/24/2021   Procedure: ENDOVEIN HARVEST OF GREATER SAPHENOUS VEIN;  Surgeon: Lajuana Matte, MD;  Location: [place];  Service: Open Heart Surgery;  Laterality: Right;   INTRAVASCULAR ULTRASOUND/IVUS N/A  11/19/2020   Procedure: Intravascular Ultrasound/IVUS;  Surgeon: Nelva Bush, MD;  Location: [place] [facility];  Service: Cardiovascular;  Laterality: N/A;   IR THORACENTESIS ASP PLEURAL SPACE W/IMG GUIDE  01/31/2022   LEFT HEART CATH AND CORONARY ANGIOGRAPHY N/A 11/19/2020   Procedure: LEFT HEART CATH AND CORONARY ANGIOGRAPHY;  Surgeon: Nelva Bush, MD;  Location: [place] [facility];  Service: Cardiovascular;  Laterality: N/A;   LEFT HEART CATH AND CORONARY ANGIOGRAPHY N/A 12/24/2020   Procedure: LEFT HEART CATH AND CORONARY ANGIOGRAPHY;  Surgeon: Martinique, Peter M, MD;  Location: [place] [facility];  Service: Cardiovascular;  Laterality: N/A;   LEFT HEART CATH AND CORONARY ANGIOGRAPHY N/A 12/16/2021   Procedure: LEFT HEART CATH AND CORONARY ANGIOGRAPHY;  Surgeon: Leonie Man, MD;  Location: [place] [facility];  Service: Cardiovascular;  Laterality: N/A;   LUMBAR LAMINECTOMY     SHOULDER ARTHROSCOPY WITH ROTATOR CUFF REPAIR AND SUBACROMIAL DECOMPRESSION Right 11/23/2018   Procedure: SHOULDER ARTHROSCOPY WITH ROTATOR CUFF REPAIR AND SUBACROMIAL DECOMPRESSION;  Surgeon: [name], MD;  Location: [place];  Service: Orthopedics;  Laterality: Right;   TEE WITHOUT CARDIOVERSION N/A 12/24/2021   Procedure: TRANSESOPHAGEAL ECHOCARDIOGRAM (TEE);  Surgeon: Lajuana Matte, MD;  Location: [place];  Service: Open Heart Surgery;  Laterality: N/A;    ALLERGIES: Allergies  Allergen Reactions   Niacin Anaphylaxis    FAMILY  HISTORY: Family History  Problem Relation Age of Onset   COPD Mother    Stroke Father    Ovarian cancer Sister    Stomach cancer Sister    Heart disease Sister        MI   Heart disease Sister        MI   Stomach cancer Maternal Grandmother    Alcohol abuse Other    Depression Other    Arthritis Other    Hypertension Other    Coronary artery disease Other    Ovarian cancer Other        neice   Ovarian cancer Other        neice   Colon polyps Neg Hx    Colon cancer Neg Hx     SOCIAL HISTORY: Social History   Tobacco Use   Smoking status: Former    Years: 16.00    Types: Cigarettes    Quit date: 12/29/1985    Years since quitting: 37.0   Smokeless tobacco: Never  Vaping Use   Vaping Use: Never used  Substance Use Topics   Alcohol use: Not Currently   Drug use: No   Social History   Social History Narrative   Exercise-- 3 days   Pt has hs degree   Right handed   One story home   Drinks caffeine 2 cups in am   Are you right handed or left handed?    Are you currently employed ?    What is your current occupation? retired   Do you live at home alone?   Who lives with you?  wife   What type of home do you live in: 1 story or 2 story?         Objective:  Vital Signs:  BP 118/72   Pulse 65   Ht '5\' 11"'$  (1.803 m)   Wt 204 lb (92.5 kg)   SpO2 99%   BMI 28.45 kg/m   General: General appearance: Awake and alert. No distress. Cooperative with exam.  Skin: No obvious rash or jaundice. HEENT: Atraumatic. Anicteric. Lungs:  Non-labored breathing on room air   Neurological: Mental Status: Alert. Speech fluent. No pseudobulbar affect Cranial Nerves: CNII: No RAPD. Visual [place] intact. CNIII, IV, VI: PERRL. No nystagmus. EOMI. CN V: Facial sensation intact bilaterally to fine touch. CN VII: Facial muscles symmetric and strong. No ptosis at rest. CN VIII: Hears finger rub well bilaterally. CN IX: No hypophonia. CN X: Palate elevates symmetrically. CN XI:  Full strength shoulder shrug bilaterally. CN XII: Tongue protrusion full and midline. No atrophy or fasciculations. No significant dysarthria Motor: Tone is normal.  Individual muscle group testing (MRC grade out of 5):  Movement     Neck flexion 5    Neck extension 5     Right Left   Shoulder abduction 5- 5   Shoulder adduction 5- 5   Shoulder ext rotation 4+ 5   Shoulder int rotation 5 5   Elbow flexion 5 5   Elbow extension 5 5   Wrist extension 5 5   Wrist flexion 5 5   Finger abduction - FDI 5 5   Finger abduction - ADM 5 5   Finger extension 5 5   Finger distal flexion - 2/'3 5 5   '$ Finger distal flexion - 4/'5 5 5   '$ Thumb flexion - FPL 5 5   Thumb abduction - APB 5 5    Hip flexion 5 5   Hip extension 5 5   Hip adduction 5 5   Hip abduction 5 5   Knee extension 5 5   Knee flexion 5 5   Dorsiflexion 5 5   Plantarflexion 5 5     Reflexes:  Right Left  Bicep 1+ 1+  Tricep 1+ 1+  BrRad 1+ 1+  Knee 1+ 1+  Ankle 0 0   Sensation: Pinprick: Intact except: Diminished on lateral aspect of arms from elbow to hand and in bilateral feet to calves in distal to proximal gradient. Coordination: Intact finger-to- nose-finger bilaterally. Gait: Antalgic gait. Normal based.   Lab and Test Review: New results: 09/05/22: Normal or unremarkable: B12 (> 1500), B1, vit E, MMA, copper, IFE, SPEP   12/09/22: A1c: 6.5   EMG (10/27/22): NCV & EMG Findings: Extensive electrodiagnostic evaluation of the right upper and lower limbs shows: Absent right sural, superficial fibular, and median sensory responses. Right ulnar and radial sensory responses are within normal limits. Right tibial (AH) motor response shows reduced amplitude (2.6 mV). Right median (APB) motor response shows prolonged distal onset latency (7.1 ms). Right fibular (EDB) and ulnar (ADM) motor responses are within normal limits. Right H reflex is absent. Chronic motor axon loss changes without accompanying active  denervation changes are seen in the right tibialis anterior, medial head of gastrocnemius, first dorsal interosseous, and extensor indicis proprius muscles.   Impression: This is an abnormal electrodiagnostic evaluation. The findings are most consistent with the following: Evidence of a generalized sensorimotor polyneuropathy, axon loss in type, mild in degree electrically. Evidence of a right median mononeuropathy at or distal to the wrist, consistent with carpal tunnel syndrome, severe in degree electrically. The residuals of an old intraspinal canal lesion (ie: motor radiculopathy) at the right C8 root, mild in degree electrically. No definite evidence of a right lumbosacral (L3-S1) motor radiculopathy.   Previously reviewed results: Internal labs: Normal or unremarkable: TSH, lipid panel, CBC, CMP, HIV HbA1c (06/03/22): 7.2 B12 (02/26/21): 306 B1 (02/26/21): 13 Vit D (01/17/21): 42   Lumbar spine xray (06/03/22): FINDINGS: There is no evidence of lumbar  spine fracture. Alignment is normal. There is moderate disc space narrowing at L4-L5 and L5-S1 with endplate osteophyte formation, similar to the prior study. Degenerative osteophytes are seen at the endplates at X8-P3, also unchanged. There are atherosclerotic calcifications of the aorta.   IMPRESSION: 1. No evidence for fracture or malalignment. 2. Stable moderate degenerative changes.   MRI cervical spine (11/26/21): FINDINGS: Alignment: No significant listhesis.   Vertebrae: No fracture, evidence of discitis, or bone lesion. Discogenic endplate marrow changes most pronounced at C6-7.   Cord: Normal signal and morphology.   Posterior Fossa, vertebral arteries, paraspinal tissues: Negative.   Disc levels:   C2-C3: Unremarkable.   C3-C4: Small disc osteophyte complex, slightly eccentric to the left. Unremarkable facet joints. Moderate left foraminal stenosis. No canal stenosis. Unchanged.   C4-C5: Minimal disc osteophyte  complex. Mild facet hypertrophy. Mild bilateral foraminal stenosis. No canal stenosis. Unchanged.   C5-C6: Disc osteophyte complex with left uncovertebral spurring. Minimal facet hypertrophy on the left. Severe left and mild-moderate right foraminal stenosis. No significant canal stenosis. Unchanged.   C6-C7: Disc osteophyte complex with bilateral uncovertebral spurring resulting in moderate to severe left and moderate right foraminal stenosis. No canal stenosis. Unchanged.   C7-T1: Unremarkable.   IMPRESSION: 1. No significant interval progression of multilevel cervical spondylosis, as described above. No significant canal stenosis at any level. 2. Severe left foraminal stenosis at C5-6 and moderate-to-severe left foraminal stenosis at C6-7.   MRI brain wo contrast (03/18/21): FINDINGS: Brain: Mild generalized atrophy with progression. Mild ventricular enlargement due to atrophy. Mild hyperintensity in the pons bilaterally likely due to chronic microvascular ischemia. Cerebral white matter normal. No hemorrhage or mass identified.   Vascular: Normal arterial flow voids.   Skull and upper cervical spine: No focal skeletal lesion identified.   Sinuses/Orbits: Mild mucosal edema paranasal sinuses. Negative orbit   Other: None   IMPRESSION: No acute abnormality   Mild atrophy, progressive since 2016.   MRI lumbar spine (06/11/19): FINDINGS: Segmentation: The lowest lumbar type non-rib-bearing vertebra is labeled as L5.   Alignment:  No vertebral subluxation is observed.   Vertebrae: Type 2 degenerative endplate findings at A2-5 and to a lesser extent at L1-2 and L5-S1. Schmorl's node along the anterior inferior endplate of L1. Disc desiccation at L4-5 and L5-S1 with loss of disc height especially at L5-S1.   Conus medullaris and cauda equina: Conus extends to the L1 level. Conus and cauda equina appear normal.   Paraspinal and other soft tissues: A fluid signal  intensity lesion of the left kidney is partially characterized on today's exam. This is statistically likely to be a cyst but not technically specific.   Disc levels:   L1-2: No impingement.  Diffuse disc bulge.   L2-3: Mild displacement of the right L2 nerve in the lateral extraforaminal space due to right lateral extraforaminal disc protrusion. Diffuse disc bulge.   L3-4: No impingement.  Diffuse disc bulge.   L4-5: Mild bilateral subarticular lateral recess stenosis due to disc bulge and mild left facet arthropathy.   L5-S1: Borderline bilateral foraminal stenosis due to intervertebral and facet spurring as well as disc bulge. Prior right laminectomy.   IMPRESSION: 1. Lumbar spondylosis and degenerative disc disease, causing mild impingement at L2-3 and L4-5 as detailed above.   ASSESSMENT: This is Ronald Petersen, a 67 y.o. 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 Could increase to 900 mg TID, but patient okay with current dose for now -Topical Lidocaine cream over the counter for tingling -Patient prefers to defer PT for now due to cost  Return to clinic in 1 year or sooner if needed  Total time spent reviewing records, interview, history/exam, documentation, and coordination of care on day of encounter:  30 min  Kai Levins, MD

## 2023-01-19 ENCOUNTER — Ambulatory Visit: Payer: Self-pay | Admitting: Licensed Clinical Social Worker

## 2023-01-19 NOTE — Patient Instructions (Signed)
Visit Information  Thank you for taking time to visit with me today. Please don't hesitate to contact me if I can be of assistance to you.   Following are the goals we discussed today:   Goals Addressed             This Visit's Progress    COMPLETED: Care Coordination Activitie No Follow up Required       Care Coordination Interventions: PHQ2/ PHQ9 completed Solution-Focused Strategies employed:  Active listening / Reflection utilized  Provided general psycho-education for mental health needs  Reviewed mental health medications and discussed importance of compliance: taking Zoloft '100mg'$   Provided EMMI education information on :Breathing to Relax and Managing Anxiety Around Daily Tasks.  Suicidal Ideation/Homicidal Ideation assessed: denied  Community food options (see below)  Definition [place] Wednesday 3AQ-7MA, Friday 11am-2pm, and Saturday 9am-12pm  [place]: [place], [place] 2nd & 3rd Friday of each month from [place]   [place]  [place]  [phone] Sat 9:00 to 10:00          If you are experiencing a [place] or [place] or need someone to talk to, please call the Suicide and Crisis Lifeline: 988 call the [place] National Suicide Prevention Lifeline: 780 289 6144 or TTY: 301 863 5017 TTY 769-869-0922) to talk to a trained counselor call 1-800-273-TALK (toll free, 24 hour hotline) go to [facility] [address] [place] [phone])   Patient verbalizes understanding of instructions and care plan provided today and agrees to view in [place]. Active MyChart status and patient understanding of how to access instructions and care plan via MyChart confirmed with patient.     Casimer Lanius, [place] [phone]

## 2023-01-19 NOTE — Patient Outreach (Signed)
  Care Coordination  Initial Visit Note   01/19/2023 Name: Ronald Petersen MRN: [id number] DOB: 1956/11/07  Ronald Petersen is a 67 y.o. year old male who sees Ronald Petersen, Ronald Apa, Ronald Petersen for primary care. I spoke with  Ronald Petersen by phone today.  What matters to the patients health and wellness today?    Assessed patient's needs, support system and barriers to care.  Need identified related to food insecurities. Declined further intervention with depression  Recommendation: Patient may benefit from therapy however he is not in agreement.  He has agreed to review EMMI educational information e-mailed  today.   Goals Addressed             This Visit's Progress    COMPLETED: Care Coordination Activitie No Follow up Required       Care Coordination Interventions: PHQ2/ PHQ9 completed Solution-Focused Strategies employed:  Active listening / Reflection utilized  Provided general psycho-education for mental health needs  Reviewed mental health medications and discussed importance of compliance: taking Zoloft '100mg'$   Provided EMMI education information on :Breathing to Relax and Managing Anxiety Around Daily Tasks.  Suicidal Ideation/Homicidal Ideation assessed: denied  Community food options (see below)  Definition [place] Wednesday 3HW-8SH, Friday 11am-2pm, and Saturday 9am-12pm  [place]: [place], [place] 2nd & 3rd Friday of each month from [place]   [place]  [place]  [phone] Sat 9:00 to 10:00         SDOH assessments and interventions completed:  Yes  SDOH Interventions Today    Flowsheet Row Most Recent Value  SDOH Interventions   Food Insecurity Interventions Other (Comment)  [resources provided]  Housing Interventions Intervention Not Indicated  Transportation Interventions Intervention Not Indicated  Utilities Interventions Intervention Not Indicated  Alcohol  Usage Interventions Intervention Not Indicated (Score <7)  Depression Interventions/Treatment  Medication  [discussed counseling options]  Financial Strain Interventions Intervention Not Indicated  Stress Interventions Provide Counseling        Care Coordination Interventions:  Yes, provided   Follow up plan: No further intervention required by LCSW. Reminded patient of upcoming appointments.  Encounter Outcome:  Pt. Visit Completed   Ronald Petersen, [place] [phone]

## 2023-01-20 ENCOUNTER — Ambulatory Visit: Payer: Medicare HMO

## 2023-01-21 ENCOUNTER — Telehealth: Payer: Self-pay | Admitting: Family Medicine

## 2023-01-21 NOTE — Telephone Encounter (Signed)
Pt called to cancel appt on 1.25.24 due to wife in the process of passing away. Pt will need a RS for a later time.

## 2023-01-22 ENCOUNTER — Ambulatory Visit: Payer: Medicare HMO | Admitting: Neurology

## 2023-01-22 ENCOUNTER — Encounter: Payer: Self-pay | Admitting: Neurology

## 2023-01-22 ENCOUNTER — Ambulatory Visit: Payer: Medicare HMO

## 2023-01-22 VITALS — BP 118/72 | HR 65 | Ht 71.0 in | Wt 204.0 lb

## 2023-01-22 DIAGNOSIS — G621 Alcoholic polyneuropathy: Secondary | ICD-10-CM

## 2023-01-22 DIAGNOSIS — E1142 Type 2 diabetes mellitus with diabetic polyneuropathy: Secondary | ICD-10-CM

## 2023-01-22 DIAGNOSIS — R69 Illness, unspecified: Secondary | ICD-10-CM | POA: Diagnosis not present

## 2023-01-22 DIAGNOSIS — R269 Unspecified abnormalities of gait and mobility: Secondary | ICD-10-CM | POA: Diagnosis not present

## 2023-01-22 DIAGNOSIS — M48061 Spinal stenosis, lumbar region without neurogenic claudication: Secondary | ICD-10-CM | POA: Diagnosis not present

## 2023-01-22 DIAGNOSIS — E538 Deficiency of other specified B group vitamins: Secondary | ICD-10-CM | POA: Diagnosis not present

## 2023-01-22 DIAGNOSIS — R209 Unspecified disturbances of skin sensation: Secondary | ICD-10-CM | POA: Diagnosis not present

## 2023-01-22 NOTE — Patient Instructions (Addendum)
You can also try Lidocaine cream as needed. Apply wear you have pain, tingling, or burning. Wear gloves to prevent your hands being numb. This can be bought over the counter at any drug store or online.  Cock up wrist splint for carpal tunnel symptoms can be bought in local drug stores or online. This should especially be worn at night while sleeping.     I would like to see you back in clinic in 1 year or sooner if needed.  The physicians and staff at [facility] are committed to providing excellent care. You may receive a survey requesting feedback about your experience at our office. We strive to receive "very good" responses to the survey questions. If you feel that your experience would prevent you from giving the office a "very good " response, please contact our office to try to remedy the situation. We may be reached at [phone]. Thank you for taking the time out of your busy day to complete the survey.  Kai Levins, MD [facility]  Preventing Falls at [facility] are common, often dreaded events in the lives of older people. Aside from the obvious injuries and even death that may result, fall can cause wide-ranging consequences including loss of independence, mental decline, decreased activity and mobility. Younger people are also at risk of falling, especially those with chronic illnesses and fatigue.  Ways to reduce risk for falling Examine diet and medications. Warm foods and alcohol dilate blood vessels, which can lead to dizziness when standing. Sleep aids, antidepressants and pain medications can also increase the likelihood of a fall.  Get a vision exam. Poor vision, cataracts and glaucoma increase the chances of falling.  Check foot gear. Shoes should fit snugly and have a sturdy, nonskid sole and a broad, low heel  Participate in a physician-approved exercise program to build and maintain muscle strength and improve balance and coordination. Programs that  use ankle weights or stretch bands are excellent for muscle-strengthening. Water aerobics programs and low-impact Tai Chi programs have also been shown to improve balance and coordination.  Increase vitamin D intake. Vitamin D improves muscle strength and increases the amount of calcium the body is able to absorb and deposit in bones.  How to prevent falls from common hazards Floors - Remove all loose wires, cords, and throw rugs. Minimize clutter. Make sure rugs are anchored and smooth. Keep furniture in its usual place.  Chairs -- Use chairs with straight backs, armrests and firm seats. Add firm cushions to existing pieces to add height.  Bathroom - Install grab bars and non-skid tape in the tub or shower. Use a bathtub transfer bench or a shower chair with a back support Use an elevated toilet seat and/or safety rails to assist standing from a low surface. Do not use towel racks or bathroom tissue holders to help you stand.  Lighting - Make sure halls, stairways, and entrances are well-lit. Install a night light in your bathroom or hallway. Make sure there is a light switch at the top and bottom of the staircase. Turn lights on if you get up in the middle of the night. Make sure lamps or light switches are within reach of the bed if you have to get up during the night.  Kitchen - Install non-skid rubber mats near the sink and stove. Clean spills immediately. Store frequently used utensils, pots, pans between waist and eye level. This helps prevent reaching and bending. Sit when getting things out of lower  cupboards.  Living room/ Bedrooms - Place furniture with wide spaces in between, giving enough room to move around. Establish a route through the living room that gives you something to hold onto as you walk.  Stairs - Make sure treads, rails, and rugs are secure. Install a rail on both sides of the stairs. If stairs are a threat, it might be helpful to arrange most of your activities on the  lower level to reduce the number of times you must climb the stairs.  Entrances and doorways - Install metal handles on the walls adjacent to the doorknobs of all doors to make it more secure as you travel through the doorway.  Tips for maintaining balance Keep at least one hand free at all times. Try using a backpack or fanny pack to hold things rather than carrying them in your hands. Never carry objects in both hands when walking as this interferes with keeping your balance.  Attempt to swing both arms from front to back while walking. This might require a conscious effort if Parkinson's disease has diminished your movement. It will, however, help you to maintain balance and posture, and reduce fatigue.  Consciously lift your feet off of the ground when walking. Shuffling and dragging of the feet is a common culprit in losing your balance.  When trying to navigate turns, use a "U" technique of facing forward and making a wide turn, rather than pivoting sharply.  Try to stand with your feet shoulder-length apart. When your feet are close together for any length of time, you increase your risk of losing your balance and falling.  Do one thing at a time. Don't try to walk and accomplish another task, such as reading or looking around. The decrease in your automatic reflexes complicates motor function, so the less distraction, the better.  Do not wear rubber or gripping soled shoes, they might "catch" on the floor and cause tripping.  Move slowly when changing positions. Use deliberate, concentrated movements and, if needed, use a grab bar or walking aid. Count 15 seconds between each movement. For example, when rising from a seated position, wait 15 seconds after standing to begin walking.  If balance is a continuous problem, you might want to consider a walking aid such as a cane, walking stick, or walker. Once you've mastered walking with help, you might be ready to try it on your own again.

## 2023-01-22 NOTE — Telephone Encounter (Signed)
I called patient to see what might be going on with his wife because she too is a patient in our practice.  Patient clarified that they are in [facility] with his wife's aunt who is not doing well. They expect she will pass in the next few days.   Told patient I was sorry about his family member but glad his wife is OK.  I will call him back in 7 to 10 days to reschedule face to face appointment for Chronic Care Management.

## 2023-01-27 ENCOUNTER — Ambulatory Visit (INDEPENDENT_AMBULATORY_CARE_PROVIDER_SITE_OTHER): Payer: Medicare HMO

## 2023-01-27 ENCOUNTER — Other Ambulatory Visit (HOSPITAL_BASED_OUTPATIENT_CLINIC_OR_DEPARTMENT_OTHER): Payer: Self-pay

## 2023-01-27 ENCOUNTER — Other Ambulatory Visit: Payer: Self-pay | Admitting: Family Medicine

## 2023-01-27 DIAGNOSIS — E1165 Type 2 diabetes mellitus with hyperglycemia: Secondary | ICD-10-CM

## 2023-01-27 DIAGNOSIS — I10 Essential (primary) hypertension: Secondary | ICD-10-CM

## 2023-01-27 DIAGNOSIS — G4700 Insomnia, unspecified: Secondary | ICD-10-CM

## 2023-01-27 MED ORDER — TRAZODONE HCL 50 MG PO TABS
50.0000 mg | ORAL_TABLET | Freq: Every evening | ORAL | 3 refills | Status: DC | PRN
  Filled 2023-01-27: qty 30, 30d supply, fill #0

## 2023-01-28 DIAGNOSIS — I10 Essential (primary) hypertension: Secondary | ICD-10-CM | POA: Diagnosis not present

## 2023-01-28 DIAGNOSIS — E1159 Type 2 diabetes mellitus with other circulatory complications: Secondary | ICD-10-CM

## 2023-01-29 NOTE — Chronic Care Management (AMB) (Signed)
Chronic Care Management   CCM RN Visit Note   Name: Ronald Petersen MRN: [id number] DOB: [birth year]  Subjective: Ronald Petersen is a 67 y.o. year old male who is a primary care patient of [name], DO. The patient was referred to the Chronic Care Management team for assistance with care management needs subsequent to provider initiation of CCM services and plan of care.    Today's Visit:  Engaged with patient by telephone for initial visit.     SDOH Interventions Today    Flowsheet Row Most Recent Value  SDOH Interventions   Food Insecurity Interventions Other (Comment)  [Received information regarding food pantry]  Housing Interventions Intervention Not Indicated  Transportation Interventions Intervention Not Indicated  Utilities Interventions Intervention Not Indicated  Alcohol Usage Interventions Intervention Not Indicated (Score <7)  Depression Interventions/Treatment  Medication, Currently on Treatment  Social Connections Interventions Intervention Not Indicated         Goals Addressed             This Visit's Progress    Goal: CCM (Diabetes) Expected Outcome:  Monitor, Self-Manage And Reduce Symptoms of Diabetes       Current Barriers:  Chronic Disease Management support and education needs related to Diabetes  Planned Interventions: Reviewed provider's plan for diabetes management.  Reviewed medication. Reports taking medications as prescribed. Denies current concerns regarding medication management or prescription cost. Discussed importance of consistent blood glucose readings. Reports monitoring as advised. Reports fasting readings have ranged from 115 to 120s. Advised to continue monitoring and maintain a log to identify trends. Provided information regarding hypoglycemia and hyperglycemia along with appropriate interventions. Denies recent hypoglycemic or hyperglycemic episodes. Discussed current activity level. Reports engaging in low impact  activities without difficulty. Advised to remain active and exercise at least a few times a week if possible. Discussed nutritional intake and importance of complying with a diabetic diet. Advised to increase consumption of fruits, vegetables, and lean proteins. Advised to monitor intake of carbohydrates and avoid foods and beverages with added sugar when possible. Encouraged to notify the team if additional referral for nutrition/dietician is needed. Discussed importance of completing recommended DM preventive care. Completing foot care as advised. Due for an updated Eye Exam. Will schedule within the next month. Discussed importance of completing ordered labs as prescribed.  Assessed social determinant of health barriers.    Lab Results  Component Value Date   HGBA1C 6.5 12/09/2022    Symptom Management: Take medications as prescribed Attend all scheduled provider appointments Schedule Eye Exam Check feet daily for cuts, sores or redness Wash and dry feet carefully every day Wear comfortable, cotton socks Wear comfortable, well-fitting shoes Engage in low impact exercises/activity as tolerated Read food labels for fat, fiber, carbohydrates and portion size Call provider office for new concerns or questions      Follow Up Plan:  Will follow up next month       Goal: CCM (Hypertension) Expected Outcome:  Monitor, Self-Manage And Reduce Symptoms of Hypertension       Current Barriers:  Chronic Disease Management support and education needs related to Hypertension  Planned Interventions: Reviewed current treatment plan related to Hypertension, self-management, and adherence to plan as established by provider.  Reviewed medications and indications for use. Reports taking medications as prescribed. Denies concerns r/t medication management or prescription cost. Reports tolerating current regimen. Provided information regarding established blood pressure parameters along with  indications for notifying a provider. Reports monitoring routinely at  home. Reports readings have been within range. Advised to monitor BP a few times a week if unable to monitor daily and record readings.  Reviewed symptoms. Denies chest pain or palpitations. Denies headaches, dizziness, or visual changes.  Discussed compliance with recommended cardiac prudent diet. Encouraged to read nutrition labels, continue monitoring sodium intake, and avoid highly processed foods when possible.  Reviewed schedule. Due for follow-up with the Cardiology team. Will contact clinic to schedule. Reviewed s/sx of heart attack, stroke and worsening symptoms that require immediate medical attention.   BP Readings from Last 3 Encounters:  01/22/23 118/72  12/04/22 114/70  10/22/22 125/80    Symptom Management: Take medications as prescribed Attend all scheduled provider appointments Schedule Eye Exam Check feet daily for cuts, sores or redness Wash and dry feet carefully every day Wear comfortable, cotton socks Wear comfortable, well-fitting shoes Engage in low impact exercises/activity as tolerated Read food labels for fat, fiber, carbohydrates and portion size Call provider office for new concerns or questions    Follow Up Plan:  Will follow up next month           PLAN: A member of the care management team will follow up next month.   Horris Latino RN Care Manager/Chronic Care Management [phone]

## 2023-01-29 NOTE — Patient Instructions (Signed)
Thank you for allowing the Chronic Care Management team to participate in your care. It was great speaking with you!   Our next outreach is scheduled for 02/17/23 at 1100. Please do not hesitate to contact me if you require assistance prior to the next appointment. Please call the care guide team at [phone] if you need to cancel or reschedule your appointment.    Following is a copy of the CCM Program Consent:  CCM service includes personalized support from designated clinical staff supervised by the physician, including individualized plan of care and coordination with other care providers 24/7 contact phone numbers for assistance for urgent and routine care needs. Service will only be billed when office clinical staff spend 20 minutes or more in a month to coordinate care. Only one practitioner may furnish and bill the service in a calendar month. The patient may stop CCM services at amy time (effective at the end of the month) by phone call to the office staff. The patient will be responsible for cost sharing (co-pay) or up to 20% of the service fee (after annual deductible is met)   Following is a copy of your full provider care plan:   Goals Addressed             This Visit's Progress    Goal: CCM (Diabetes) Expected Outcome:  Monitor, Self-Manage And Reduce Symptoms of Diabetes       Current Barriers:  Chronic Disease Management support and education needs related to Diabetes  Planned Interventions: Reviewed provider's plan for diabetes management.  Reviewed medication. Reports taking medications as prescribed. Denies current concerns regarding medication management or prescription cost. Discussed importance of consistent blood glucose readings. Reports monitoring as advised. Reports fasting readings have ranged from 115 to 120s. Advised to continue monitoring and maintain a log to identify trends. Provided information regarding hypoglycemia and hyperglycemia along with  appropriate interventions. Denies recent hypoglycemic or hyperglycemic episodes. Discussed current activity level. Reports engaging in low impact activities without difficulty. Advised to remain active and exercise at least a few times a week if possible. Discussed nutritional intake and importance of complying with a diabetic diet. Advised to increase consumption of fruits, vegetables, and lean proteins. Advised to monitor intake of carbohydrates and avoid foods and beverages with added sugar when possible. Encouraged to notify the team if additional referral for nutrition/dietician is needed. Discussed importance of completing recommended DM preventive care. Completing foot care as advised. Due for an updated Eye Exam. Will schedule within the next month. Discussed importance of completing ordered labs as prescribed.  Assessed social determinant of health barriers.    Lab Results  Component Value Date   HGBA1C 6.5 12/09/2022    Symptom Management: Take medications as prescribed Attend all scheduled provider appointments Schedule Eye Exam Check feet daily for cuts, sores or redness Wash and dry feet carefully every day Wear comfortable, cotton socks Wear comfortable, well-fitting shoes Engage in low impact exercises/activity as tolerated Read food labels for fat, fiber, carbohydrates and portion size Call provider office for new concerns or questions      Follow Up Plan:  Will follow up next month       Goal: CCM (Hypertension) Expected Outcome:  Monitor, Self-Manage And Reduce Symptoms of Hypertension       Current Barriers:  Chronic Disease Management support and education needs related to Hypertension  Planned Interventions: Reviewed current treatment plan related to Hypertension, self-management, and adherence to plan as established by provider.  Reviewed medications  and indications for use. Reports taking medications as prescribed. Denies concerns r/t medication management  or prescription cost. Reports tolerating current regimen. Provided information regarding established blood pressure parameters along with indications for notifying a provider. Reports monitoring routinely at home. Reports readings have been within range. Advised to monitor BP a few times a week if unable to monitor daily and record readings.  Reviewed symptoms. Denies chest pain or palpitations. Denies headaches, dizziness, or visual changes.  Discussed compliance with recommended cardiac prudent diet. Encouraged to read nutrition labels, continue monitoring sodium intake, and avoid highly processed foods when possible.  Reviewed schedule. Due for follow-up with the Cardiology team. Will contact clinic to schedule. Reviewed s/sx of heart attack, stroke and worsening symptoms that require immediate medical attention.   BP Readings from Last 3 Encounters:  01/22/23 118/72  12/04/22 114/70  10/22/22 125/80    Symptom Management: Take medications as prescribed Attend all scheduled provider appointments Schedule Eye Exam Check feet daily for cuts, sores or redness Wash and dry feet carefully every day Wear comfortable, cotton socks Wear comfortable, well-fitting shoes Engage in low impact exercises/activity as tolerated Read food labels for fat, fiber, carbohydrates and portion size Call provider office for new concerns or questions    Follow Up Plan:  Will follow up next month           [name] understanding of instructions and care plan provided. Agrees to view in MyChart.   A member of the care management team will follow  up next month.     Ronald Latino RN Care Manager/Chronic Care Management [phone]

## 2023-01-29 NOTE — Plan of Care (Signed)
Chronic Care Management Provider Comprehensive Care Plan     Name: Ronald Petersen MRN: [id number] DOB: 13-Jun-1956  Referral to Chronic Care Management (CCM) services was placed by Provider:  [name], DO on Date: 12/04/22.  Chronic Condition 1: HTN Provider Assessment and Plan  Essential hypertension -     CBC with Differential/Platelet; Future -     Comprehensive metabolic panel; Future -     Hemoglobin A1c; Future -     Lipid panel; Future -     Microalbumin / creatinine urine ratio; Future -     TSH; Future -     AMB Referral to Chronic Care Management Services  Expected Outcome/Goals Addressed This Visit (Provider CCM goals/Provider Assessment and plan  Goal: CCM (Hypertension) Expected Outcome:  Monitor, Self-Manage And Reduce Symptoms of Hypertension  Symptom Management Condition 1: Take all medications as prescribed Attend all scheduled provider appointments Call pharmacy for medication refills 3-7 days in advance of running out of medications Check blood pressure  Keep a blood pressure log Engage in low impact activities/exercise as tolerated Read nutrition labels. Eat whole grains, fruits and vegetables, lean meats and healthy fats Limit salt intake  Call provider office for new concerns or questions      Chronic Condition 2: DM Provider Assessment and Plan Type 2 diabetes mellitus with hyperglycemia, without long-term current use of insulin (HCC) -     CBC with Differential/Platelet; Future -     Comprehensive metabolic panel; Future -     Hemoglobin A1c; Future -     Lipid panel; Future -     Microalbumin / creatinine urine ratio; Future -     TSH; Future -     AMB Referral to Chronic Care Management Services  Expected Outcome/Goals Addressed This Visit (Provider CCM goals/Provider Assessment and plan  Goal: CCM (Diabetes) Expected Outcome:  Monitor, Self-Manage And Reduce Symptoms of Diabetes  Symptom Management Condition 2: Take  medications as prescribed Attend all scheduled provider appointments Schedule Eye Exam Check feet daily for cuts, sores or redness Wash and dry feet carefully every day Wear comfortable, cotton socks Wear comfortable, well-fitting shoes Engage in low impact exercises/activity as tolerated Read food labels for fat, fiber, carbohydrates and portion size Call provider office for new concerns or questions   Problem List Patient Active Problem List   Diagnosis Date Noted   Lumbar radiculopathy 08/19/2022   Suspicious nevus 08/15/2022   Bulge of lumbar disc without myelopathy 08/15/2022   Cervical radiculopathy 08/04/2022   Nocturia 06/03/2022   Atypical chest pain 01/25/2022   Pleural effusion 01/24/2022   S/P CABG x 2 12/24/2021   Coronary artery disease involving native coronary artery of native heart with unstable angina pectoris ([place]) 12/16/2021   Chronic pain syndrome 06/07/2021   Mild neurocognitive disorder due to multiple etiologies 05/10/2021   Type II diabetes mellitus with hypoglycemia ([place]) 01/22/2021   Type 2 diabetes mellitus with diabetic polyneuropathy, without long-term current use of insulin ([place]) 71/05/2693   Metabolic syndrome 85/46/2703   Coronary artery disease 01/17/2021   Anxiety    Arthritis    Cancer ([place])    Complication of anesthesia    GERD (gastroesophageal reflux disease)    Hyperlipidemia    Hypertension    Neuromuscular disorder ([place])    Sleep apnea    Coronary artery disease involving native coronary artery of native heart with angina pectoris ([place]) 11/20/2020   CAD S/P DES PCI LAD & LCx-OM 11/20/2020  Unstable angina ([place]) 11/19/2020   Abnormal cardiac CT angiography 11/19/2020   Shortness of breath 10/02/2020   Dizziness 10/02/2020   Chest pain 10/02/2020   Obesity (BMI 30-39.9) 10/02/2020   Type 2 diabetes mellitus with diabetic neuropathy, without long-term current use of insulin ([place]) 06/06/2020   Uncontrolled type 2 diabetes mellitus  with hyperglycemia ([place]) 05/31/2020   Blurry vision 02/07/2020   Spinal stenosis of lumbar region 02/07/2020   Frequent falls 02/07/2020   Foraminal stenosis of cervical region 07/26/2019   Aortic atherosclerosis ([place]) 05/27/2019   Chronic bilateral low back pain without sciatica 05/20/2019   Carpal tunnel syndrome, bilateral 05/20/2019   Weakness of both lower extremities 01/05/2019   Falling episodes 01/05/2019   Right rotator cuff tear 11/23/2018   Neck pain 11/04/2018   Acute pain of right shoulder 11/04/2018   Gastroesophageal reflux disease 11/04/2018   Hyperlipidemia associated with type 2 diabetes mellitus ([place]) 11/04/2018   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 06/28/2018   Essential hypertension 09/14/2017   Preventative health care 06/12/2016   OSA (obstructive sleep apnea) 04/07/2016   Joint pain 04/05/2016   NSAID long-term use 12/13/2015   Nausea without vomiting 12/13/2015   History of colonic polyps 12/13/2015   Weight loss 12/13/2015   Depression 11/06/2015   Metatarsal deformity 02/20/2015   Equinus deformity of foot, acquired 02/20/2015   Plantar fasciitis, bilateral 02/15/2015   Left wrist injury 06/15/2014   Peripheral neuropathy 04/01/2012   Fatigue 11/05/2010   OTHER SPECIFIED DISORDER OF PENIS 03/26/2010   NAUSEA 03/26/2010   Pain in limb 10/17/2008   NECK PAIN, CHRONIC 11/24/2007   Diabetes mellitus, type II ([place]) 01/27/2007   Hyperlipidemia LDL goal <70 01/27/2007    Medication Management  Current Outpatient Medications:    acetaminophen (TYLENOL) 650 MG CR tablet, Take 650 mg by mouth every 8 (eight) hours as needed for pain., Disp: , Rfl:    aspirin EC 325 MG EC tablet, Take 1 tablet (325 mg total) by mouth daily., Disp: , Rfl:    ELDERBERRY PO, Take 300 mg by mouth daily., Disp: , Rfl:    empagliflozin (JARDIANCE) 10 MG TABS tablet, Take 1 tablet (10 mg total) by mouth daily., Disp: 30 tablet, Rfl: 3   famotidine  (PEPCID) 20 MG tablet, Take 1 tablet (20 mg total) by mouth daily., Disp: 90 tablet, Rfl: 1   fenofibrate 160 MG tablet, TAKE 1 TABLET (160 MG TOTAL) BY MOUTH DAILY., Disp: 90 tablet, Rfl: 1   gabapentin (NEURONTIN) 800 MG tablet, Take 1 tablet (800 mg total) by mouth 3 (three) times daily., Disp: 270 tablet, Rfl: 2   Magnesium Citrate 200 MG TABS, Take 400 mg by mouth daily., Disp: , Rfl:    ondansetron (ZOFRAN) 4 MG tablet, Take 1 tablet (4 mg total) by mouth every 8 (eight) hours as needed., Disp: 20 tablet, Rfl: 2   pantoprazole (PROTONIX) 40 MG tablet, Take 1 tablet (40 mg total) by mouth 2 (two) times daily., Disp: 60 tablet, Rfl: 3   sertraline (ZOLOFT) 100 MG tablet, TAKE 1 & 1/2 TABLET BY MOUTH ONCE DAILY, Disp: 135 tablet, Rfl: 3   tiZANidine (ZANAFLEX) 4 MG tablet, Take 1 tablet (4 mg total) by mouth every 6 (six) hours as needed for muscle spasms., Disp: 30 tablet, Rfl: 1   umeclidinium-vilanterol (ANORO ELLIPTA) 62.5-25 MCG/ACT AEPB, Inhale 1 puff into the lungs daily., Disp: 60 each, Rfl: 3   vitamin E 180 MG (  400 UNITS) capsule, Take 1,600 Units by mouth daily., Disp: , Rfl:    glucose blood (ONETOUCH VERIO) test strip, USE AS INSTRUCTED TO CHECK BLOOD SUGAR ONCE A DAY, Disp: 100 strip, Rfl: 12   traZODone (DESYREL) 50 MG tablet, Take 1 tablet (50 mg total) by mouth at bedtime as needed for sleep., Disp: 30 tablet, Rfl: 3  Cognitive Assessment Identity Confirmed: : Name; DOB Cognitive Status: Normal   Functional Assessment Hearing Difficulty or Deaf: no Wear Glasses or Blind: yes Vision Management: Wears Glasses Concentrating, Remembering or Making Decisions Difficulty (CP): no Difficulty Communicating: no Difficulty Eating/Swallowing: no Walking or Climbing Stairs Difficulty: no Dressing/Bathing Difficulty: no Doing Errands Independently Difficulty (such as shopping) (CP): no Change in Functional Status Since Onset of Current Illness/Injury: no   Caregiver Assessment   Primary Source of Support/Comfort: child(ren); spouse People in Home: spouse Family Caregiver if Needed: none   Planned Interventions  Hypertension Reviewed current treatment plan related to Hypertension, self-management, and adherence to plan as established by provider.  Reviewed medications and indications for use. Reports taking medications as prescribed. Denies concerns r/t medication management or prescription cost. Reports tolerating current regimen. Provided information regarding established blood pressure parameters along with indications for notifying a provider. Reports monitoring routinely at home. Reports readings have been within range. Advised to monitor BP a few times a week if unable to monitor daily and record readings.  Reviewed symptoms. Denies chest pain or palpitations. Denies headaches, dizziness, or visual changes.  Discussed compliance with recommended cardiac prudent diet. Encouraged to read nutrition labels, continue monitoring sodium intake, and avoid highly processed foods when possible.  Reviewed schedule. Due for follow-up with the Cardiology team. Will contact clinic to schedule. Reviewed s/sx of heart attack, stroke and worsening symptoms that require immediate medical attention.    Diabetes Reviewed provider's plan for diabetes management.  Reviewed medication. Reports taking medications as prescribed. Denies current concerns regarding medication management or prescription cost. Discussed importance of consistent blood glucose readings. Reports monitoring as advised. Reports fasting readings have ranged from 115 to 120s. Advised to continue monitoring and maintain a log to identify trends. Provided information regarding hypoglycemia and hyperglycemia along with appropriate interventions. Denies recent hypoglycemic or hyperglycemic episodes. Discussed current activity level. Reports engaging in low impact activities without difficulty. Advised to remain active  and exercise at least a few times a week if possible. Discussed nutritional intake and importance of complying with a diabetic diet. Advised to increase consumption of fruits, vegetables, and lean proteins. Advised to monitor intake of carbohydrates and avoid foods and beverages with added sugar when possible. Encouraged to notify the team if additional referral for nutrition/dietician is needed. Discussed importance of completing recommended DM preventive care. Completing foot care as advised. Due for an updated Eye Exam. Will schedule within the next month. Discussed importance of completing ordered labs as prescribed.  Assessed social determinant of health barriers.    Interaction and coordination with outside resources, practitioners, and providers See CCM Referral  Care Plan: Available in MyChart

## 2023-01-30 ENCOUNTER — Other Ambulatory Visit (HOSPITAL_BASED_OUTPATIENT_CLINIC_OR_DEPARTMENT_OTHER): Payer: Self-pay

## 2023-02-06 ENCOUNTER — Ambulatory Visit: Payer: Medicare HMO

## 2023-02-06 DIAGNOSIS — H524 Presbyopia: Secondary | ICD-10-CM | POA: Diagnosis not present

## 2023-02-06 LAB — HM DIABETES EYE EXAM

## 2023-02-10 ENCOUNTER — Other Ambulatory Visit: Payer: Self-pay | Admitting: Pulmonary Disease

## 2023-02-10 ENCOUNTER — Other Ambulatory Visit: Payer: Self-pay

## 2023-02-10 ENCOUNTER — Other Ambulatory Visit (HOSPITAL_BASED_OUTPATIENT_CLINIC_OR_DEPARTMENT_OTHER): Payer: Self-pay

## 2023-02-10 MED ORDER — ANORO ELLIPTA 62.5-25 MCG/ACT IN AEPB
1.0000 | INHALATION_SPRAY | Freq: Every day | RESPIRATORY_TRACT | 5 refills | Status: DC
  Filled 2023-02-10: qty 60, 60d supply, fill #0

## 2023-02-10 NOTE — Telephone Encounter (Signed)
Patient has been rescheduled for 02/18/2023 at 10:15am

## 2023-02-11 ENCOUNTER — Other Ambulatory Visit: Payer: Self-pay

## 2023-02-16 ENCOUNTER — Other Ambulatory Visit: Payer: Self-pay | Admitting: Family Medicine

## 2023-02-16 ENCOUNTER — Other Ambulatory Visit: Payer: Self-pay | Admitting: Pulmonary Disease

## 2023-02-16 ENCOUNTER — Other Ambulatory Visit (HOSPITAL_BASED_OUTPATIENT_CLINIC_OR_DEPARTMENT_OTHER): Payer: Self-pay

## 2023-02-16 ENCOUNTER — Other Ambulatory Visit: Payer: Self-pay | Admitting: Cardiology

## 2023-02-16 DIAGNOSIS — R110 Nausea: Secondary | ICD-10-CM

## 2023-02-16 DIAGNOSIS — G4700 Insomnia, unspecified: Secondary | ICD-10-CM

## 2023-02-17 ENCOUNTER — Ambulatory Visit (INDEPENDENT_AMBULATORY_CARE_PROVIDER_SITE_OTHER): Payer: Medicare HMO

## 2023-02-17 ENCOUNTER — Other Ambulatory Visit (HOSPITAL_BASED_OUTPATIENT_CLINIC_OR_DEPARTMENT_OTHER): Payer: Self-pay

## 2023-02-17 DIAGNOSIS — I10 Essential (primary) hypertension: Secondary | ICD-10-CM

## 2023-02-17 DIAGNOSIS — E1165 Type 2 diabetes mellitus with hyperglycemia: Secondary | ICD-10-CM

## 2023-02-17 MED ORDER — PANTOPRAZOLE SODIUM 40 MG PO TBEC
40.0000 mg | DELAYED_RELEASE_TABLET | Freq: Two times a day (BID) | ORAL | 3 refills | Status: DC
  Filled 2023-02-17: qty 60, 30d supply, fill #0
  Filled 2023-03-23: qty 60, 30d supply, fill #1
  Filled 2023-04-22: qty 60, 30d supply, fill #2

## 2023-02-17 MED ORDER — TRAZODONE HCL 50 MG PO TABS
50.0000 mg | ORAL_TABLET | Freq: Every evening | ORAL | 3 refills | Status: DC | PRN
  Filled 2023-02-17: qty 30, 30d supply, fill #0
  Filled 2023-03-10: qty 30, 30d supply, fill #1
  Filled 2023-04-09: qty 30, 30d supply, fill #2

## 2023-02-17 MED ORDER — ANORO ELLIPTA 62.5-25 MCG/ACT IN AEPB
1.0000 | INHALATION_SPRAY | Freq: Every day | RESPIRATORY_TRACT | 5 refills | Status: DC
  Filled 2023-02-17: qty 60, 60d supply, fill #0
  Filled 2023-04-09: qty 60, 30d supply, fill #0
  Filled 2023-05-11: qty 60, 30d supply, fill #1
  Filled 2023-05-14: qty 60, 30d supply, fill #0
  Filled 2023-06-04 – 2023-06-08 (×2): qty 60, 30d supply, fill #1
  Filled 2023-07-07: qty 60, 30d supply, fill #2
  Filled 2023-08-06: qty 60, 30d supply, fill #3
  Filled 2023-09-03: qty 60, 30d supply, fill #4

## 2023-02-17 NOTE — Chronic Care Management (AMB) (Signed)
Chronic Care Management   CCM RN Visit Note  02/17/2023 Name: Ronald Petersen MRN: [id number] DOB: March 09, 1956  Subjective: Ronald Petersen is a 67 y.o. year old male who is a primary care patient of [name], DO. The patient was referred to the Chronic Care Management team for assistance with care management needs subsequent to provider initiation of CCM services and plan of care.    Today's Visit:  Engaged with patient by telephone for follow up visit.    Goals Addressed             This Visit's Progress    Goal: CCM (Diabetes) Expected Outcome:  Monitor, Self-Manage And Reduce Symptoms of Diabetes       Current Barriers:  Chronic Disease Management support and education needs related to Diabetes  Planned Interventions: Reviewed provider's plan for diabetes management.  Reviewed medications. Reports taking medications as prescribed. In addition to meds for diabetes management he is currently taking gabapentin for neuropathy. Reports adjusting dose as instructed by Neurologist/Dr. Berdine Addison. Denies current concerns regarding medication management or prescription cost. Reviewed blood glucose readings. Reports monitoring consistently as advised. Reports fasting readings have remained in the 120s. Denies hypoglycemic or hyperglycemic episodes.   Reviewed nutritional intake. Advised to continue monitoring intake of carbohydrates. Advised to avoid foods/beverages with added sugar when possible.  Reports exercise remains limited d/t generalized chronic joint pain.  Reviewed recommended DM exams. Reports completing eye exam as scheduled. Plans to pick up new prescription eyewear later this month.    Lab Results  Component Value Date   HGBA1C 6.5 12/09/2022    Symptom Management: Take medications as prescribed Attend all scheduled provider appointments Check feet daily for cuts, sores or redness Wash and dry feet carefully every day Wear comfortable, cotton socks Wear  comfortable, well-fitting shoes Engage in low impact exercises/activity as tolerated Read food labels for fat, fiber, carbohydrates and portion size Call provider office for new concerns or questions      Follow Up Plan:  Will follow up next month       Goal: CCM (Hypertension) Expected Outcome:  Monitor, Self-Manage And Reduce Symptoms of Hypertension       Current Barriers:  Chronic Disease Management support and education needs related to Hypertension  Planned Interventions: Reviewed current treatment plan related to Hypertension.  Reports monitoring BP as advised and taking medications as prescribed.  Reports BP readings have been good and within normal range. Advised to record readings to identity trends. Reviewed parameters and indications for notifying a provider.  Reviewed symptoms. Reports shortness of breath with exertion and prolonged activity. Denies episodes at rest. Denies chest pain or palpitations. Denies headaches, dizziness, or visual changes.  Encouraged to continue reading nutrition labels, monitoring sodium intake, and avoiding highly processed foods when possible.  Collaborated with the Cardiology team regarding routine follow-up. Scheduled for outreach with Dr. Harriet Masson on 04/14/23. Informed of need to bring insurance card and list of medications to this appointment.  Reviewed s/sx of heart attack, stroke and worsening symptoms that require immediate medical attention.    BP Readings from Last 3 Encounters:  01/22/23 118/72  12/04/22 114/70  10/22/22 125/80    Symptom Management: Take all medications as prescribed Attend Cardiology appointment as scheduled on 04/14/23 Call pharmacy for medication refills 3-7 days in advance of running out of medications Check blood pressure and keep a log Call doctor for signs and symptoms of high blood pressure Engage in low impact exercises and  mild activity as tolerated Read nutrition labels. Eat whole grains, fruits and  vegetables, lean meats and healthy fats Limit salt intake  Call provider office for new concerns or questions    Follow Up Plan:  Will follow up next month            PLAN: A member of the care management team will follow up next month.   Horris Latino RN Care Manager/Chronic Care Management [phone]

## 2023-02-17 NOTE — Telephone Encounter (Signed)
I will sent a message to Dr. Harriet Masson and speak with the patient to clarify he does take the medication due to it not being in the last office notes.

## 2023-02-18 ENCOUNTER — Ambulatory Visit: Payer: Medicare HMO | Admitting: Pharmacist

## 2023-02-18 ENCOUNTER — Other Ambulatory Visit (HOSPITAL_BASED_OUTPATIENT_CLINIC_OR_DEPARTMENT_OTHER): Payer: Self-pay

## 2023-02-18 DIAGNOSIS — E785 Hyperlipidemia, unspecified: Secondary | ICD-10-CM

## 2023-02-18 DIAGNOSIS — I25119 Atherosclerotic heart disease of native coronary artery with unspecified angina pectoris: Secondary | ICD-10-CM

## 2023-02-18 DIAGNOSIS — E1140 Type 2 diabetes mellitus with diabetic neuropathy, unspecified: Secondary | ICD-10-CM

## 2023-02-18 MED ORDER — EZETIMIBE 10 MG PO TABS
10.0000 mg | ORAL_TABLET | Freq: Every day | ORAL | 3 refills | Status: DC
  Filled 2023-02-18: qty 90, 90d supply, fill #0
  Filled 2023-03-10 – 2023-05-18 (×2): qty 90, 90d supply, fill #1

## 2023-02-19 ENCOUNTER — Other Ambulatory Visit (HOSPITAL_BASED_OUTPATIENT_CLINIC_OR_DEPARTMENT_OTHER): Payer: Self-pay

## 2023-02-19 ENCOUNTER — Other Ambulatory Visit: Payer: Self-pay | Admitting: Internal Medicine

## 2023-02-20 ENCOUNTER — Other Ambulatory Visit: Payer: Self-pay | Admitting: Family Medicine

## 2023-02-20 ENCOUNTER — Other Ambulatory Visit (HOSPITAL_BASED_OUTPATIENT_CLINIC_OR_DEPARTMENT_OTHER): Payer: Self-pay

## 2023-02-20 ENCOUNTER — Ambulatory Visit: Payer: Medicare HMO

## 2023-02-20 DIAGNOSIS — F32A Depression, unspecified: Secondary | ICD-10-CM

## 2023-02-20 MED ORDER — SERTRALINE HCL 100 MG PO TABS
150.0000 mg | ORAL_TABLET | Freq: Every day | ORAL | 1 refills | Status: DC
  Filled 2023-02-20: qty 135, fill #0
  Filled 2023-02-23 – 2023-03-23 (×4): qty 135, 90d supply, fill #0
  Filled 2023-06-22: qty 135, 90d supply, fill #1

## 2023-02-23 ENCOUNTER — Other Ambulatory Visit: Payer: Self-pay | Admitting: Family Medicine

## 2023-02-23 ENCOUNTER — Other Ambulatory Visit (HOSPITAL_BASED_OUTPATIENT_CLINIC_OR_DEPARTMENT_OTHER): Payer: Self-pay

## 2023-02-23 DIAGNOSIS — E785 Hyperlipidemia, unspecified: Secondary | ICD-10-CM

## 2023-02-23 DIAGNOSIS — G8929 Other chronic pain: Secondary | ICD-10-CM

## 2023-02-23 DIAGNOSIS — R110 Nausea: Secondary | ICD-10-CM

## 2023-02-23 MED ORDER — EMPAGLIFLOZIN 10 MG PO TABS
10.0000 mg | ORAL_TABLET | Freq: Every day | ORAL | 1 refills | Status: DC
  Filled 2023-02-23 – 2023-03-10 (×3): qty 90, 90d supply, fill #0

## 2023-02-23 MED ORDER — TIZANIDINE HCL 4 MG PO TABS
4.0000 mg | ORAL_TABLET | Freq: Four times a day (QID) | ORAL | 1 refills | Status: DC | PRN
  Filled 2023-02-23: qty 30, 8d supply, fill #0
  Filled 2023-03-23: qty 30, 8d supply, fill #1

## 2023-02-23 MED ORDER — FENOFIBRATE 160 MG PO TABS
160.0000 mg | ORAL_TABLET | Freq: Every day | ORAL | 1 refills | Status: DC
  Filled 2023-02-23: qty 90, 90d supply, fill #0
  Filled 2023-06-11: qty 90, 90d supply, fill #1

## 2023-02-23 MED ORDER — ONDANSETRON HCL 4 MG PO TABS
4.0000 mg | ORAL_TABLET | Freq: Three times a day (TID) | ORAL | 2 refills | Status: DC | PRN
  Filled 2023-02-23: qty 20, 7d supply, fill #0
  Filled 2023-04-09: qty 20, 7d supply, fill #1
  Filled 2023-05-06: qty 20, 7d supply, fill #2

## 2023-02-23 NOTE — Patient Instructions (Signed)
Ronald Petersen It was a pleasure speaking with you today.  Below is a summary of your health goals and summary of our recent visit. You can also view your updated Chronic Care Management Care plan through your MyChart account.   I have emailed you a copy of the 2024 [employer] over-the-counter catalog.    Goals Addressed             This Visit's Progress    CCM Expected outcome:  Monitor, Self-Manage and Reduce Symptoms of HLD       Goals:   LDL < 55 due to history of CABG and stents + type 2 diabetes.   Reviewed past history of statin intolerance.   Take cholesterol lowering medications as prescribed.    Interventions:   Start ezetimibe '10mg'$  daily.   Continue fenofibrate '160mg'$  daily - requested refill.   Follow heart healthy, low fat diet.   Exercise as able (currently unable to exercise much due to pain)     Goal: CCM (Diabetes) Expected Outcome:  Monitor, Self-Manage And Reduce Symptoms of Diabetes       Current Barriers:  Chronic Disease Management support and education needs related to Diabetes  Planned Interventions: Reviewed provider's plan for diabetes management.  Reviewed medication. Reports taking medications as prescribed. Denies current concerns regarding medication management or prescription cost. Discussed importance of consistent blood glucose readings. Reports monitoring as advised. Reports fasting readings have ranged from 115 to 120s. Advised to continue monitoring and maintain a log to identify trends. Provided information regarding hypoglycemia and hyperglycemia along with appropriate interventions.  Discussed current activity level. No current exercise due to pain.  Advised to be active and exercise at least a few times a week if possible. Discussed nutritional intake and importance of complying with a diabetic diet. Advised to increase consumption of fruits, vegetables, and lean proteins. Advised to monitor intake of carbohydrates and avoid foods and beverages with  added sugar when possible. Encouraged to notify the team if additional referral for nutrition/dietician is needed. Discussed importance of completing recommended DM preventive care. Completing foot care as advised. Eye exam completed F3bruary 2024 - requested report from [facility] on [address] in [place], [place]. Discussed importance of completing ordered labs as prescribed.  Assessed social determinant of health barriers.    Completed appt with neurology d/t neuropathy in lower extremities. Taking 1/2 dose three times a day Completed eye exam. Will pick up new glassess on 02/18/23 Reports having splints for carpal tunnel. Needs new splintsf Reports fasting blood sugars have been in the 120s.    Lab Results  Component Value Date   HGBA1C 6.5 12/09/2022    Symptom Management: Take medications as prescribed Attend all scheduled provider appointments Schedule Eye Exam Check feet daily for cuts, sores or redness Wash and dry feet carefully every day Wear comfortable, cotton socks Wear comfortable, well-fitting shoes Engage in low impact exercises/activity as tolerated Read food labels for fat, fiber, carbohydrates and portion size Call provider office for new concerns or questions      Follow Up Plan:  Will follow up next month           As always if you have any questions or concerns especially regarding medications, please feel free to contact me either at the phone number below or with a MyChart message.   Keep up the good work!  Cherre Robins, PharmD Clinical Pharmacist [place] [phone] (direct line)  [phone] (main office  number)   Patient verbalizes understanding of instructions and care plan provided today and agrees to view in [place]. Active MyChart status and patient understanding of how to access instructions and care plan via MyChart confirmed with patient.

## 2023-02-23 NOTE — Progress Notes (Signed)
Chronic Care Management Pharmacy Note  02/23/2023 Name:  Ronald Petersen MRN:  [id number] DOB:  [birth year]  Summary: CAD / Hyperlipidemia: Not at LDL goal of < 57 History of CABG x 2 with stent; father had stroke Current therapy - fenofibrate '160mg'$  daily Past therapy - rosuvastatin '40mg'$  daily (patient declined) pravastatin '40mg'$  - stopped 2021; rosuvastatin '20mg'$  - stopped 2021. Niacin - stopped 2007 due to severe flushing requiring ED visit.   Type 2 DM:  Last A1c at goal.  Current therapy - Jardiance '10mg'$  daily.  Past therapy - metformin - stopped because patient was concerned about potential side effects.  Reports he checks blood glucose daily. HBG usually 125 to 140.  Patient states he has blood glucose < 100 about 2 to 3 times per week and sometimes has symptoms of hypoglycemia when blood glucose < 100.   Blood pressure:  BP Readings from Last 3 Encounters:  01/22/23 118/72  12/04/22 114/70  10/22/22 125/80   Anxiety / OCD:  Current therapy - sertraline '100mg'$  -  tablet daily  Patient reports good control with current therapy  Chronic Pain / polyneuropathy:  Seeing neurosurgeon about surgery.  Current therapy - gabapentin '800mg'$  3 times a day. Also prescribed writs splints for carpel tunnel.   Medication Management:  Reviewed med list and updated  Reviewed refill history.  Patient reports no issues with medication costs. He is getting Extra Help thru Medicare with med costs. Had only family planning plan with Medicaid.   Recommendations/Changes made from today's visit: Start ezetimibe '10mg'$  daily. Continue fenofibrate '160mg'$  daily. Patient declined statin therapy Discussed blood glucose goals.  Fasting blood glucose goal (before meals) = 80 to 130 Blood glucose goal after a meal = less than 180   Reviewed How to Treat a Low Glucose Level:  If you have a low blood glucose less than 70, please eat / drink 15 grams of carbohydrates (4 oz of juice, soda, 4 glucose tablets,  or 3-4 pieces of hard candy).  It is best so choose a "quick" source of sugar that does no contain fat (chocolate and peanut butter might take longer to increase your blood glucose) Wait 15 minutes and then recheck your blood glucose. If your blood glucose is still less than 70, eat another 15 grams of carbohydrates.  Wait another 15 minutes and recheck your glucose.  Continue this until your blood glucose is over 70. Once you blood glucose is over 70, eat a snack with protein in it to prevent your blood glucose from dropping again.  Discussed over-the-counter benefits of his insurance. Sent email to patient with 2024 [employer] over-the-counter catalog.  Requested fenofibrate Rx refill.  Requested updated eye appt notes. From visit a few weeks ago. (Patient sees [facility] on [address].   Meds ordered this encounter  Medications   ezetimibe (ZETIA) 10 MG tablet    Sig: Take 1 tablet (10 mg total) by mouth daily. For cholesterol    Dispense:  90 tablet    Refill:  3   fenofibrate 160 MG tablet    Sig: Take 1 tablet (160 mg total) by mouth daily.    Dispense:  90 tablet    Refill:  1   empagliflozin (JARDIANCE) 10 MG TABS tablet    Sig: Take 1 tablet (10 mg total) by mouth daily.    Dispense:  90 tablet    Refill:  1    Plan:  Subjective: Ronald Petersen is an 67 y.o. year  old male who is a primary patient of [name], DO.  The patient was referred to the Chronic Care Management team for assistance with care management needs subsequent to provider initiation of CCM services and plan of care.    Engaged with patient by telephone for initial visit in response to provider referral for CCM services.   Objective:  LABS:    Lab Results  Component Value Date   CREATININE 0.95 12/09/2022   CREATININE 0.92 06/03/2022   CREATININE 0.90 05/14/2022     Lab Results  Component Value Date   HGBA1C 6.5 12/09/2022         Component Value Date/Time   CHOL 175 12/09/2022  0945   TRIG 109.0 12/09/2022 0945   TRIG 148 12/10/2006 1603   HDL 45.40 12/09/2022 0945   CHOLHDL 4 12/09/2022 0945   VLDL 21.8 12/09/2022 0945   LDLCALC 108 (H) 12/09/2022 0945   LDLCALC 96 09/06/2020 1138   LDLDIRECT 93.0 05/31/2020 0910     Clinical ASCVD: Yes   The 10-year ASCVD risk score (Arnett DK, et al., 2019) is: 22.7%   Values used to calculate the score:     Age: 67 years     Sex: Male     Is Non-Hispanic African American: No     Diabetic: Yes     Tobacco smoker: No     Systolic Blood Pressure: 123456 mmHg     Is BP treated: No     HDL Cholesterol: 45.4 mg/dL     Total Cholesterol: 175 mg/dL    Other: (CHADS2VASc if Afib, PHQ9 if depression, MMRC or CAT for COPD, ACT, DEXA)    BP Readings from Last 3 Encounters:  01/22/23 118/72  12/04/22 114/70  10/22/22 125/80      SDOH:  (Social Determinants of Health) assessments and interventions performed:    Allergies  Allergen Reactions   Niacin Anaphylaxis    Flushing - required ED visit    Medications Reviewed Today     Reviewed by Cherre Robins, RPH-CPP (Pharmacist) on 02/18/23 at 42  Med List Status: <None>   Medication Order Taking? Sig Documenting Provider Last Dose Status Informant  acetaminophen (TYLENOL) 650 MG CR tablet [id number] Yes Take 650 mg by mouth every 8 (eight) hours as needed for pain. [provider] Taking Active Self  aspirin EC 325 MG EC tablet [id number] Yes Take 1 tablet (325 mg total) by mouth daily. Odis Luster Taking Active Self  ELDERBERRY PO [id number] Yes Take 300 mg by mouth daily. [provider] Taking Active Self  empagliflozin (JARDIANCE) 10 MG TABS tablet [id number] Yes Take 1 tablet (10 mg total) by mouth daily. Berniece Salines, DO Taking Active   famotidine (PEPCID) 20 MG tablet [id number] Yes Take 1 tablet (20 mg total) by mouth daily. [name], DO Taking Active            Med Note Graylin Shiver Jan 27, 2023 12:34 PM) Reports  taking as needed  fenofibrate 160 MG tablet [id number] Yes TAKE 1 TABLET (160 MG TOTAL) BY MOUTH DAILY. [name], DO Taking Active   gabapentin (NEURONTIN) 800 MG tablet [id number] Yes Take 1 tablet (800 mg total) by mouth 3 (three) times daily. [place], Bennie Pierini T, DPM Taking Active            Med Note Minerva Ends, [place] N   Tue Feb 17, 2023  1:56 PM) Reports cutting pills in  half. Taking 400 mg three times a day.  glucose blood (ONETOUCH VERIO) test strip [id number] Yes USE AS INSTRUCTED TO CHECK BLOOD SUGAR ONCE A DAY Shamleffer, Melanie Crazier, MD Taking Active Self  Magnesium Citrate 200 MG TABS [id number] Yes Take 400 mg by mouth daily. [provider] Taking Active Self  ondansetron (ZOFRAN) 4 MG tablet [id number] Yes Take 1 tablet (4 mg total) by mouth every 8 (eight) hours as needed. Roma Schanz R, DO Taking Active   pantoprazole (PROTONIX) 40 MG tablet [id number] Yes Take 1 tablet (40 mg total) by mouth 2 (two) times daily. [name], Kendrick Fries R, DO Taking Active   sertraline (ZOLOFT) 100 MG tablet [id number] Yes TAKE 1 & 1/2 TABLET BY MOUTH ONCE DAILY [name], Alferd Apa, DO Taking Active   tiZANidine (ZANAFLEX) 4 MG tablet [id number] Yes Take 1 tablet (4 mg total) by mouth every 6 (six) hours as needed for muscle spasms. [name], DO Taking Active   traZODone (DESYREL) 50 MG tablet [id number] Yes Take 1 tablet (50 mg total) by mouth at bedtime as needed for sleep. Roma Schanz R, DO Taking Active   umeclidinium-vilanterol [facility] ELLIPTA) 62.5-25 MCG/ACT AEPB [id number] Yes Inhale 1 puff into the lungs daily. Garner Nash, DO Taking Active   vitamin E 180 MG (400 UNITS) capsule [id number] Yes Take 1,600 Units by mouth daily. [provider] Taking Active Self              Goals Addressed             This Visit's Progress    CCM Expected outcome:  Monitor, Self-Manage and Reduce Symptoms of HLD       Goals:   LDL < 55 due to  history of CABG and stents + type 2 diabetes.   Reviewed past history of statin intolerance.   Take cholesterol lowering medications as prescribed.    Interventions:   Start ezetimibe '10mg'$  daily.   Continue fenofibrate '160mg'$  daily - requested refill.   Follow heart healthy, low fat diet.   Exercise as able (currently unable to exercise much due to pain)     Goal: CCM (Diabetes) Expected Outcome:  Monitor, Self-Manage And Reduce Symptoms of Diabetes       Current Barriers:  Chronic Disease Management support and education needs related to Diabetes  Planned Interventions: Reviewed provider's plan for diabetes management.  Reviewed medication. Reports taking medications as prescribed. Denies current concerns regarding medication management or prescription cost. Discussed importance of consistent blood glucose readings. Reports monitoring as advised. Reports fasting readings have ranged from 115 to 120s. Advised to continue monitoring and maintain a log to identify trends. Provided information regarding hypoglycemia and hyperglycemia along with appropriate interventions.  Discussed current activity level. No current exercise due to pain.  Advised to be active and exercise at least a few times a week if possible. Discussed nutritional intake and importance of complying with a diabetic diet. Advised to increase consumption of fruits, vegetables, and lean proteins. Advised to monitor intake of carbohydrates and avoid foods and beverages with added sugar when possible. Encouraged to notify the team if additional referral for nutrition/dietician is needed. Discussed importance of completing recommended DM preventive care. Completing foot care as advised. Eye exam completed F3bruary 2024 - requested report from [facility] on [address] in [place], [place]. Discussed importance of completing ordered labs as prescribed.  Assessed social determinant of health barriers.    Completed appt  with  neurology d/t neuropathy in lower extremities. Taking 1/2 dose three times a day Completed eye exam. Will pick up new glassess on 02/18/23 Reports having splints for carpal tunnel. Needs new splintsf Reports fasting blood sugars have been in the 120s.    Lab Results  Component Value Date   HGBA1C 6.5 12/09/2022    Symptom Management: Take medications as prescribed Attend all scheduled provider appointments Schedule Eye Exam Check feet daily for cuts, sores or redness Wash and dry feet carefully every day Wear comfortable, cotton socks Wear comfortable, well-fitting shoes Engage in low impact exercises/activity as tolerated Read food labels for fat, fiber, carbohydrates and portion size Call provider office for new concerns or questions      Follow Up Plan:  Will follow up next month          Plan: Telephone follow up appointment with care management team member scheduled for:  1 to 2 months    Cherre Robins, PharmD Clinical Pharmacist [facility]

## 2023-02-26 DIAGNOSIS — E1159 Type 2 diabetes mellitus with other circulatory complications: Secondary | ICD-10-CM | POA: Diagnosis not present

## 2023-02-26 DIAGNOSIS — I10 Essential (primary) hypertension: Secondary | ICD-10-CM | POA: Diagnosis not present

## 2023-03-09 ENCOUNTER — Other Ambulatory Visit: Payer: Self-pay

## 2023-03-09 ENCOUNTER — Other Ambulatory Visit (HOSPITAL_BASED_OUTPATIENT_CLINIC_OR_DEPARTMENT_OTHER): Payer: Self-pay

## 2023-03-10 ENCOUNTER — Other Ambulatory Visit (HOSPITAL_BASED_OUTPATIENT_CLINIC_OR_DEPARTMENT_OTHER): Payer: Self-pay

## 2023-03-10 ENCOUNTER — Other Ambulatory Visit: Payer: Self-pay

## 2023-03-10 ENCOUNTER — Other Ambulatory Visit: Payer: Self-pay | Admitting: Family Medicine

## 2023-03-10 ENCOUNTER — Other Ambulatory Visit: Payer: Self-pay | Admitting: Internal Medicine

## 2023-03-10 DIAGNOSIS — R110 Nausea: Secondary | ICD-10-CM

## 2023-03-12 ENCOUNTER — Other Ambulatory Visit (HOSPITAL_BASED_OUTPATIENT_CLINIC_OR_DEPARTMENT_OTHER): Payer: Self-pay

## 2023-03-12 ENCOUNTER — Encounter: Payer: Self-pay | Admitting: Family Medicine

## 2023-03-12 ENCOUNTER — Other Ambulatory Visit: Payer: Self-pay

## 2023-03-12 ENCOUNTER — Ambulatory Visit (INDEPENDENT_AMBULATORY_CARE_PROVIDER_SITE_OTHER): Payer: Medicare HMO | Admitting: Family Medicine

## 2023-03-12 ENCOUNTER — Ambulatory Visit (HOSPITAL_BASED_OUTPATIENT_CLINIC_OR_DEPARTMENT_OTHER)
Admission: RE | Admit: 2023-03-12 | Discharge: 2023-03-12 | Disposition: A | Discharge status: Home / Self Care | Payer: Medicare HMO | Source: Ambulatory Visit | Attending: Family Medicine | Admitting: Family Medicine

## 2023-03-12 VITALS — BP 118/80 | Ht 71.0 in | Wt 204.0 lb

## 2023-03-12 DIAGNOSIS — M778 Other enthesopathies, not elsewhere classified: Secondary | ICD-10-CM

## 2023-03-12 DIAGNOSIS — M25512 Pain in left shoulder: Secondary | ICD-10-CM | POA: Diagnosis not present

## 2023-03-12 MED ORDER — TRIAMCINOLONE ACETONIDE 40 MG/ML IJ SUSP
40.0000 mg | Freq: Once | INTRAMUSCULAR | Status: AC
  Administered 2023-03-12: 40 mg via INTRA_ARTICULAR

## 2023-03-12 NOTE — Patient Instructions (Signed)
Good to see you Please use heat before exercises and ice after  Please try the exercises  We'll call with the xray results.   Please send me a message in MyChart with any questions or updates.  Please see me back in 4 weeks or as needed if better.   --Dr. Raeford Razor

## 2023-03-12 NOTE — Progress Notes (Signed)
Ronald Petersen - 67 y.o. male MRN [id number]  Date of birth: 16-Jul-1956  SUBJECTIVE:  Including CC & ROS.  No chief complaint on file.   Ronald Petersen is a 67 y.o. male that is presenting with acute left shoulder pain.  The pain is severe in nature.  It affects his ability to do daily activities and interrupts his sleep at night.  No specific injury or inciting event.    Review of Systems See HPI   HISTORY: Past Medical, Surgical, Social, and Family History Reviewed & Updated per EMR.   Pertinent Historical Findings include:  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   Cancer [facility])    Complication of anesthesia    aspirated with back surgery at age 42   Depression    Diabetes mellitus Type II    GERD (gastroesophageal reflux disease)    Hyperlipidemia    Hypertension    Neuromuscular disorder ([place])    NEUROPATHY   Right rotator cuff tear 11/23/2018   Sleep apnea    no CPAP    Past Surgical History:  Procedure Laterality Date   APPENDECTOMY     ARTHOSCOPIC ROTAOR CUFF REPAIR Right 11/23/2018   Procedure: ARTHROSCOPIC ROTATOR CUFF REPAIR;  Surgeon: [name], MD;  Location: [place];  Service: Orthopedics;  Laterality: Right;   CHOLECYSTECTOMY     CORONARY ARTERY BYPASS GRAFT N/A 12/24/2021   x2 LIMA to LAD; SVG to Obtuse Marginal   CORONARY STENT INTERVENTION N/A 11/19/2020   Procedure: CORONARY STENT INTERVENTION;  Surgeon: Nelva Bush, MD;  Location: [place] [facility];  Service: Cardiovascular;  Laterality: N/A;   ENDOVEIN HARVEST OF GREATER SAPHENOUS VEIN Right 12/24/2021   Procedure: ENDOVEIN HARVEST OF GREATER SAPHENOUS VEIN;  Surgeon: Lajuana Matte, MD;  Location: [place];  Service: Open Heart Surgery;  Laterality: Right;   INTRAVASCULAR ULTRASOUND/IVUS N/A 11/19/2020   Procedure: Intravascular Ultrasound/IVUS;  Surgeon: Nelva Bush, MD;   Location: [place] [facility];  Service: Cardiovascular;  Laterality: N/A;   IR THORACENTESIS ASP PLEURAL SPACE W/IMG GUIDE  01/31/2022   LEFT HEART CATH AND CORONARY ANGIOGRAPHY N/A 11/19/2020   Procedure: LEFT HEART CATH AND CORONARY ANGIOGRAPHY;  Surgeon: Nelva Bush, MD;  Location: [place] [facility];  Service: Cardiovascular;  Laterality: N/A;   LEFT HEART CATH AND CORONARY ANGIOGRAPHY N/A 12/24/2020   Procedure: LEFT HEART CATH AND CORONARY ANGIOGRAPHY;  Surgeon: Martinique, Peter M, MD;  Location: [place] [facility];  Service: Cardiovascular;  Laterality: N/A;   LEFT HEART CATH AND CORONARY ANGIOGRAPHY N/A 12/16/2021   Procedure: LEFT HEART CATH AND CORONARY ANGIOGRAPHY;  Surgeon: Leonie Man, MD;  Location: [place] [facility];  Service: Cardiovascular;  Laterality: N/A;   LUMBAR LAMINECTOMY     SHOULDER ARTHROSCOPY WITH ROTATOR CUFF REPAIR AND SUBACROMIAL DECOMPRESSION Right 11/23/2018   Procedure: SHOULDER ARTHROSCOPY WITH ROTATOR CUFF REPAIR AND SUBACROMIAL DECOMPRESSION;  Surgeon: [name], MD;  Location: [place];  Service: Orthopedics;  Laterality: Right;   TEE WITHOUT CARDIOVERSION N/A 12/24/2021   Procedure: TRANSESOPHAGEAL ECHOCARDIOGRAM (TEE);  Surgeon: Lajuana Matte, MD;  Location: [place];  Service: Open Heart Surgery;  Laterality: N/A;     PHYSICAL EXAM:  VS: BP 118/80 (BP Location: Left Arm, Patient Position: Sitting)   Ht '5\' 11"'$  (1.803 m)   Wt 204 lb (  92.5 kg)   BMI 28.45 kg/m  Physical Exam Gen: NAD, alert, cooperative with exam, well-appearing MSK:  Neurovascularly intact     Aspiration/Injection Procedure Note DEANGLEO KUBECKA [birth year]  Procedure: Injection Indications: left shoulder pain  Procedure Details Consent: Risks of procedure as well as the alternatives and risks of each were explained to the (patient/caregiver).  Consent for procedure obtained. Time Out: Verified patient identification, verified procedure,  site/side was marked, verified correct patient position, special equipment/implants available, medications/allergies/relevent history reviewed, required imaging and test results available.  Performed.  The area was cleaned with iodine and alcohol swabs.    The left glenohumeral joint was injected using 4 cc of 1% lidocaine and 0.4 cc of 8.4% sodium bicarbonate on a 22-gauge 3-1/2 inch needle.  The syringe was switched to mixture containing 1 cc's of 40 mg Kenalog and 4 cc's of 0.25% bupivacaine was injected.  Ultrasound was used. Images were obtained in short views showing the injection.     A sterile dressing was applied.  Patient did tolerate procedure well.     ASSESSMENT & PLAN:   Capsulitis of left shoulder Acutely occurring.  Symptoms more of a joint oriented as opposed to the cuff -Counseled on home exercise therapy and supportive care. -Injection today. -X-ray. -Could consider physical therapy.

## 2023-03-12 NOTE — Assessment & Plan Note (Signed)
Acutely occurring.  Symptoms more of a joint oriented as opposed to the cuff -Counseled on home exercise therapy and supportive care. -Injection today. -X-ray. -Could consider physical therapy.

## 2023-03-12 NOTE — Addendum Note (Signed)
Addended by: Cresenciano Lick on: 03/12/2023 11:58 AM   Modules accepted: Orders

## 2023-03-16 ENCOUNTER — Telehealth: Payer: Self-pay | Admitting: Family Medicine

## 2023-03-16 NOTE — Telephone Encounter (Signed)
Left VM for patient. If he calls back please have him speak with a nurse/CMA and inform that his xray shows no significant structural changes.   If any questions then please take the best time and phone number to call and I will try to call him back.   Rosemarie Ax, MD [facility] 03/16/2023, 1:06 PM

## 2023-03-17 ENCOUNTER — Other Ambulatory Visit: Payer: Self-pay

## 2023-03-17 ENCOUNTER — Observation Stay (HOSPITAL_BASED_OUTPATIENT_CLINIC_OR_DEPARTMENT_OTHER)
Admission: EM | Admit: 2023-03-17 | Discharge: 2023-03-18 | Disposition: A | Discharge status: Home / Self Care | Payer: Medicare HMO | Attending: Student | Admitting: Student

## 2023-03-17 ENCOUNTER — Encounter (HOSPITAL_BASED_OUTPATIENT_CLINIC_OR_DEPARTMENT_OTHER): Payer: Self-pay | Admitting: Emergency Medicine

## 2023-03-17 ENCOUNTER — Emergency Department (HOSPITAL_BASED_OUTPATIENT_CLINIC_OR_DEPARTMENT_OTHER): Payer: Medicare HMO

## 2023-03-17 ENCOUNTER — Telehealth: Payer: Self-pay | Admitting: Family Medicine

## 2023-03-17 DIAGNOSIS — E1169 Type 2 diabetes mellitus with other specified complication: Secondary | ICD-10-CM | POA: Diagnosis present

## 2023-03-17 DIAGNOSIS — G894 Chronic pain syndrome: Secondary | ICD-10-CM | POA: Diagnosis present

## 2023-03-17 DIAGNOSIS — E871 Hypo-osmolality and hyponatremia: Secondary | ICD-10-CM | POA: Insufficient documentation

## 2023-03-17 DIAGNOSIS — R079 Chest pain, unspecified: Secondary | ICD-10-CM | POA: Diagnosis not present

## 2023-03-17 DIAGNOSIS — Z79899 Other long term (current) drug therapy: Secondary | ICD-10-CM | POA: Insufficient documentation

## 2023-03-17 DIAGNOSIS — I25110 Atherosclerotic heart disease of native coronary artery with unstable angina pectoris: Secondary | ICD-10-CM | POA: Diagnosis present

## 2023-03-17 DIAGNOSIS — Z1152 Encounter for screening for COVID-19: Secondary | ICD-10-CM | POA: Diagnosis not present

## 2023-03-17 DIAGNOSIS — I25720 Atherosclerosis of autologous artery coronary artery bypass graft(s) with unstable angina pectoris: Principal | ICD-10-CM | POA: Insufficient documentation

## 2023-03-17 DIAGNOSIS — E785 Hyperlipidemia, unspecified: Secondary | ICD-10-CM | POA: Diagnosis present

## 2023-03-17 DIAGNOSIS — E11649 Type 2 diabetes mellitus with hypoglycemia without coma: Secondary | ICD-10-CM

## 2023-03-17 DIAGNOSIS — I2510 Atherosclerotic heart disease of native coronary artery without angina pectoris: Secondary | ICD-10-CM

## 2023-03-17 DIAGNOSIS — R0602 Shortness of breath: Secondary | ICD-10-CM | POA: Diagnosis not present

## 2023-03-17 DIAGNOSIS — Z87891 Personal history of nicotine dependence: Secondary | ICD-10-CM | POA: Insufficient documentation

## 2023-03-17 DIAGNOSIS — I200 Unstable angina: Principal | ICD-10-CM | POA: Diagnosis present

## 2023-03-17 DIAGNOSIS — I10 Essential (primary) hypertension: Secondary | ICD-10-CM | POA: Diagnosis present

## 2023-03-17 DIAGNOSIS — I159 Secondary hypertension, unspecified: Secondary | ICD-10-CM | POA: Diagnosis not present

## 2023-03-17 DIAGNOSIS — E1142 Type 2 diabetes mellitus with diabetic polyneuropathy: Secondary | ICD-10-CM | POA: Diagnosis present

## 2023-03-17 DIAGNOSIS — J449 Chronic obstructive pulmonary disease, unspecified: Secondary | ICD-10-CM | POA: Insufficient documentation

## 2023-03-17 DIAGNOSIS — Z7982 Long term (current) use of aspirin: Secondary | ICD-10-CM | POA: Diagnosis not present

## 2023-03-17 DIAGNOSIS — Z951 Presence of aortocoronary bypass graft: Secondary | ICD-10-CM | POA: Insufficient documentation

## 2023-03-17 DIAGNOSIS — G4733 Obstructive sleep apnea (adult) (pediatric): Secondary | ICD-10-CM | POA: Diagnosis present

## 2023-03-17 DIAGNOSIS — Z955 Presence of coronary angioplasty implant and graft: Secondary | ICD-10-CM | POA: Insufficient documentation

## 2023-03-17 DIAGNOSIS — R109 Unspecified abdominal pain: Secondary | ICD-10-CM | POA: Insufficient documentation

## 2023-03-17 HISTORY — DX: Disorder of arteries and arterioles, unspecified: I77.9

## 2023-03-17 HISTORY — DX: Other specified health status: Z78.9

## 2023-03-17 HISTORY — DX: Nonrheumatic aortic (valve) stenosis: I35.0

## 2023-03-17 LAB — BASIC METABOLIC PANEL
Anion gap: 8 (ref 5–15)
BUN: 27 mg/dL — ABNORMAL HIGH (ref 8–23)
CO2: 24 mmol/L (ref 22–32)
Calcium: 9 mg/dL (ref 8.9–10.3)
Chloride: 101 mmol/L (ref 98–111)
Creatinine, Ser: 0.9 mg/dL (ref 0.61–1.24)
GFR, Estimated: >60 mL/min (ref >60)
Glucose, Bld: 140 mg/dL — ABNORMAL HIGH (ref 70–99)
Potassium: 3.7 mmol/L (ref 3.5–5.1)
Sodium: 133 mmol/L — ABNORMAL LOW (ref 135–145)

## 2023-03-17 LAB — CBC
HCT: 48.5 % (ref 39.0–52.0)
Hemoglobin: 16.6 g/dL (ref 13.0–17.0)
MCH: 27.9 pg (ref 26.0–34.0)
MCHC: 34.2 g/dL (ref 30.0–36.0)
MCV: 81.6 fL (ref 80.0–100.0)
Platelets: 180 10*3/uL (ref 150–400)
RBC: 5.94 MIL/uL — ABNORMAL HIGH (ref 4.22–5.81)
RDW: 12.8 % (ref 11.5–15.5)
WBC: 8.8 10*3/uL (ref 4.0–10.5)
nRBC: 0 % (ref 0.0–0.2)

## 2023-03-17 LAB — TROPONIN I (HIGH SENSITIVITY)
Troponin I (High Sensitivity): 3 ng/L (ref <18)
Troponin I (High Sensitivity): 3 ng/L (ref <18)

## 2023-03-17 LAB — RESP PANEL BY RT-PCR (RSV, FLU A&B, COVID)  RVPGX2
Influenza A by PCR: NEGATIVE
Influenza B by PCR: NEGATIVE
Resp Syncytial Virus by PCR: NEGATIVE
SARS Coronavirus 2 by RT PCR: NEGATIVE

## 2023-03-17 LAB — GLUCOSE, CAPILLARY: Glucose-Capillary: 174 mg/dL — ABNORMAL HIGH (ref 70–99)

## 2023-03-17 LAB — BRAIN NATRIURETIC PEPTIDE: B Natriuretic Peptide: 45.2 pg/mL (ref 0.0–100.0)

## 2023-03-17 MED ORDER — PANTOPRAZOLE SODIUM 40 MG PO TBEC
40.0000 mg | DELAYED_RELEASE_TABLET | Freq: Two times a day (BID) | ORAL | Status: DC
  Administered 2023-03-17: 40 mg via ORAL
  Filled 2023-03-17: qty 1

## 2023-03-17 MED ORDER — NITROGLYCERIN 0.4 MG SL SUBL
0.4000 mg | SUBLINGUAL_TABLET | SUBLINGUAL | Status: DC | PRN
  Administered 2023-03-17: 0.4 mg via SUBLINGUAL
  Filled 2023-03-17: qty 1

## 2023-03-17 MED ORDER — METOPROLOL TARTRATE 12.5 MG HALF TABLET
12.5000 mg | ORAL_TABLET | Freq: Two times a day (BID) | ORAL | Status: DC
  Administered 2023-03-17 – 2023-03-18 (×2): 12.5 mg via ORAL
  Filled 2023-03-17 (×2): qty 1

## 2023-03-17 MED ORDER — EZETIMIBE 10 MG PO TABS: 10.0000 mg | ORAL_TABLET | Freq: Every day | ORAL | Status: DC

## 2023-03-17 MED ORDER — SERTRALINE HCL 50 MG PO TABS: 150.0000 mg | ORAL_TABLET | Freq: Every day | ORAL | Status: DC

## 2023-03-17 MED ORDER — ONDANSETRON HCL 4 MG/2ML IJ SOLN: 4.0000 mg | Freq: Four times a day (QID) | INTRAMUSCULAR | Status: DC | PRN

## 2023-03-17 MED ORDER — GABAPENTIN 400 MG PO CAPS: 400.0000 mg | ORAL_CAPSULE | Freq: Three times a day (TID) | ORAL | Status: DC

## 2023-03-17 MED ORDER — UMECLIDINIUM-VILANTEROL 62.5-25 MCG/ACT IN AEPB
1.0000 | INHALATION_SPRAY | Freq: Every day | RESPIRATORY_TRACT | Status: DC
  Administered 2023-03-18: 1 via RESPIRATORY_TRACT
  Filled 2023-03-17: qty 14

## 2023-03-17 MED ORDER — GABAPENTIN 400 MG PO CAPS
400.0000 mg | ORAL_CAPSULE | Freq: Three times a day (TID) | ORAL | Status: DC
  Administered 2023-03-17 – 2023-03-18 (×3): 400 mg via ORAL
  Filled 2023-03-17 (×3): qty 1

## 2023-03-17 MED ORDER — SERTRALINE HCL 50 MG PO TABS
150.0000 mg | ORAL_TABLET | Freq: Every day | ORAL | Status: DC
  Administered 2023-03-18: 150 mg via ORAL
  Filled 2023-03-17: qty 1

## 2023-03-17 MED ORDER — ASPIRIN 325 MG PO TBEC
325.0000 mg | DELAYED_RELEASE_TABLET | Freq: Every day | ORAL | Status: DC
  Administered 2023-03-17 – 2023-03-18 (×2): 325 mg via ORAL
  Filled 2023-03-17 (×2): qty 1

## 2023-03-17 MED ORDER — ENOXAPARIN SODIUM 40 MG/0.4ML IJ SOSY
40.0000 mg | PREFILLED_SYRINGE | INTRAMUSCULAR | Status: DC
  Administered 2023-03-17: 40 mg via SUBCUTANEOUS
  Filled 2023-03-17: qty 0.4

## 2023-03-17 MED ORDER — TRAZODONE HCL 50 MG PO TABS
50.0000 mg | ORAL_TABLET | Freq: Every evening | ORAL | Status: DC | PRN
  Administered 2023-03-18: 50 mg via ORAL
  Filled 2023-03-17: qty 1

## 2023-03-17 MED ORDER — INSULIN ASPART 100 UNIT/ML IJ SOLN: 0.0000 [IU] | Freq: Every day | INTRAMUSCULAR | Status: DC

## 2023-03-17 MED ORDER — ASPIRIN 81 MG PO TBEC
81.0000 mg | DELAYED_RELEASE_TABLET | Freq: Once | ORAL | Status: DC
  Filled 2023-03-17: qty 1

## 2023-03-17 MED ORDER — TIZANIDINE HCL 4 MG PO TABS
4.0000 mg | ORAL_TABLET | Freq: Four times a day (QID) | ORAL | Status: DC | PRN
  Administered 2023-03-18: 4 mg via ORAL
  Filled 2023-03-17 (×3): qty 1

## 2023-03-17 MED ORDER — ACETAMINOPHEN 325 MG PO TABS: 650.0000 mg | ORAL_TABLET | ORAL | Status: DC | PRN

## 2023-03-17 MED ORDER — INSULIN ASPART 100 UNIT/ML IJ SOLN: 0.0000 [IU] | Freq: Three times a day (TID) | INTRAMUSCULAR | Status: DC

## 2023-03-17 MED ORDER — PANTOPRAZOLE SODIUM 40 MG PO TBEC
40.0000 mg | DELAYED_RELEASE_TABLET | Freq: Two times a day (BID) | ORAL | Status: DC
  Administered 2023-03-18: 40 mg via ORAL
  Filled 2023-03-17: qty 1

## 2023-03-17 MED ORDER — OXYCODONE HCL 5 MG PO TABS: 5.0000 mg | ORAL_TABLET | ORAL | Status: DC | PRN

## 2023-03-17 NOTE — Telephone Encounter (Signed)
Initial Comment Caller stated that they are having chest pain, LT arm pain, BP is high, 150/94, headache. Additional Comment No 2nd number. Translation No Nurse Assessment Nurse: Ottis Stain, RN, Sherrie Date/Time (Eastern Time): 03/17/2023 1:17:49 PM Confirm and document reason for call. If symptomatic, describe symptoms. ---Caller states having left side chest pain for the last 3 days, radiating down left arm to elbow area. +SOB, +HA that comes and goes. BP 150/94. States CP is almost constant. CP 5/10. Does the patient have any new or worsening symptoms? ---Yes Will a triage be completed? ---Yes Related visit to physician within the last 2 weeks? ---No Does the PT have any chronic conditions? (i.e. diabetes, asthma, this includes High risk factors for pregnancy, etc.) ---Yes List chronic conditions. ---Type 2 Diabetic - oral, Open heart surgery 11/2021 Is this a behavioral health or substance abuse call? ---No Guidelines Guideline Title Affirmed Question Affirmed Notes Nurse Date/Time (Eastern Time) Chest Pain [1] Chest pain lasts > 5 minutes AND [2] history of heart disease (i.e., angina, heart attack, heart failure, bypass surgery, takes nitroglycerin) Ottis Stain, RN, Sherrie 03/17/2023 1:21:01 PM PLEASE NOTE: All timestamps contained within this report are represented as [place] Standard Time. CONFIDENTIALTY NOTICE: This fax transmission is intended only for the addressee. It contains information that is legally privileged, confidential or otherwise protected from use or disclosure. If you are not the intended recipient, you are strictly prohibited from reviewing, disclosing, copying using or disseminating any of this information or taking any action in reliance on or regarding this information. If you have received this fax in error, please notify us immediately by telephone so that we can arrange for its return to [place]. Phone: [phone], Toll-Free: [phone], Fax:  [phone] Page: 2 of 2 Call Id: [id number] [place]. Time Eilene Ghazi Time) Disposition Final User 03/17/2023 1:16:23 PM Send to Urgent Tenny Craw, April 03/17/2023 1:23:50 PM Call EMS 911 Now Yes Ottis Stain, RN, [place] 03/17/2023 1:24:24 PM Dawson, RN, Sherrie Reason: States not calling EMS but wife is taking him to the hospital Final Disposition 03/17/2023 1:23:50 PM Call EMS 911 Now Yes Ottis Stain, RN, Sherrie Caller Disagree/Comply Comply Caller Understands Yes PreDisposition InappropriateToAsk Care Advice Given Per Guideline CALL EMS 911 NOW: * Immediate medical attention is needed. You need to hang up and call 911 (or an ambulance). * Triager Discretion: I'll call you back in a few minutes to be sure you were able to reach them. Referrals [facility] - ED

## 2023-03-17 NOTE — ED Provider Notes (Signed)
Signout received on this 67 year old male with significant cardiac disease.  At the time of signout he is awaiting consult from cardiology.  Case was discussed with hospitalist who would like cardiology's input on if patient would be propria for restarting Ranexa and outpatient follow-up. Physical Exam  BP 129/80   Pulse 66   Temp 98.1 F (36.7 C) (Oral)   Resp 17   Ht 5\' 11"  (1.803 m)   Wt 90.7 kg   SpO2 99%   BMI 27.89 kg/m     Procedures  Procedures  ED Course / MDM   Clinical Course as of 03/17/23 1900  Tue Mar 17, 2023  1414 79 male present with chief complaint of chest pain.  Extensive history of CAD.  Concerning story per presentation today. [CC]  1427 Evaluated personally at bedside.  67 year old male with extensive medical problems.  History of CAD status post bypass in 2022, PCI in early 2000's.  State he has 2 known vessel occlusions that are too small for intervention per his last cardiology follow-up.  Chief complaint is 3 days of consistent chest pain.  It is exertional in nature in his left chest.  Denies syncope or shortness of breath at this time.  Otherwise ambulatory tolerating p.o. intake.  No nitro prior to arrival has taken aspirin at home without symptomatic improvement. Had a long shared medical decision making conversation with patient.  Given his elevated risk for coronary artery disease including ACS, emergency department evaluation is not going to build to clear patient fully for risk of ACS.  He stated he would consent to serial troponins but wants to avoid admission if at all possible today.  States he prefer to follow-up with his cardiologist if labs are negative.  Informed patient that he is at risk for death from ACS even with serial negative troponins today and he said he would talk to his wife about it. Workup is pending at time of handoff to oncoming team, favor that objective evaluation will likely be reassuring given initial EKG.  If symptoms resolved  troponins are negative and patient wishes to participate in a patient directed discharge this could be reasonable. However we would recommend for admission for ongoing observation and further cardiac restratification. [CC]    Clinical Course User Index [CC] Tretha Sciara, MD   Medical Decision Making Amount and/or Complexity of Data Reviewed Labs: ordered. Radiology: ordered.  Risk Prescription drug management. Decision regarding hospitalization.   Discussed with cardiology.  They feel he is too high risk to be discharged and would require inpatient workup.  Discussion had with hospitalist will accept patient for admission.       Evlyn Courier, PA-C 03/17/23 1900    Gareth Morgan, MD 03/18/23 1431

## 2023-03-17 NOTE — Telephone Encounter (Signed)
FYI

## 2023-03-17 NOTE — ED Notes (Signed)
ED TO INPATIENT HANDOFF REPORT  ED Nurse Name and Phone #: Newman Pies [phone]  S Name/Age/Gender Ronald Petersen 67 y.o. male Room/Bed: MH06/MH06  Code Status   Code Status: Prior  Home/SNF/Other Home Patient oriented to: self, place, time, and situation Is this baseline? Yes   Triage Complete: Triage complete  Chief Complaint Unstable angina ([place]) [I20.0]  Triage Note Constant left sided chest pain x 3 days.  Some sob.  Some nausea per family.  Some diaphoresis at night.  Some runny nose, occasional cough.  No known fever.   Allergies Allergies  Allergen Reactions   Niacin Anaphylaxis    [place] - required ED visit    Level of Care/Admitting Diagnosis ED Disposition     ED Disposition  Admit   Condition  --   [place]: [place] [[id number]]  Level of Care: Progressive [102]  Admit to Progressive based on following criteria: CARDIOVASCULAR & THORACIC of moderate stability with acute coronary syndrome symptoms/low risk myocardial infarction/hypertensive urgency/arrhythmias/heart failure potentially compromising stability and stable post cardiovascular intervention patients.  May admit patient to [facility] or [facility] if equivalent level of care is available:: No  Interfacility transfer: Yes  Covid Evaluation: Confirmed COVID Negative  Diagnosis: Unstable angina [facility]) [id number]  Admitting Physician: Dory Horn [id number]  Attending Physician: Gareth Morgan [id number]  Certification:: I certify this patient will need inpatient services for at least 2 midnights  Estimated Length of Stay: 2          B Medical/Surgery History 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   [facility])    Complication of anesthesia    aspirated with back surgery at age 24   Depression    Diabetes mellitus Type II    GERD (gastroesophageal  reflux disease)    Hyperlipidemia    Hypertension    Neuromuscular disorder ([place])    NEUROPATHY   Right rotator cuff tear 11/23/2018   Sleep apnea    no CPAP   Past Surgical History:  Procedure Laterality Date   APPENDECTOMY     ARTHOSCOPIC ROTAOR CUFF REPAIR Right 11/23/2018   Procedure: ARTHROSCOPIC ROTATOR CUFF REPAIR;  Surgeon: [name], MD;  Location: [place];  Service: Orthopedics;  Laterality: Right;   CHOLECYSTECTOMY     CORONARY ARTERY BYPASS GRAFT N/A 12/24/2021   x2 LIMA to LAD; SVG to Obtuse Marginal   CORONARY STENT INTERVENTION N/A 11/19/2020   Procedure: CORONARY STENT INTERVENTION;  Surgeon: Nelva Bush, MD;  Location: [place] CV LAB;  Service: Cardiovascular;  Laterality: N/A;   ENDOVEIN HARVEST OF GREATER SAPHENOUS VEIN Right 12/24/2021   Procedure: ENDOVEIN HARVEST OF GREATER SAPHENOUS VEIN;  Surgeon: Lajuana Matte, MD;  Location: [place];  Service: Open Heart Surgery;  Laterality: Right;   INTRAVASCULAR ULTRASOUND/IVUS N/A 11/19/2020   Procedure: Intravascular Ultrasound/IVUS;  Surgeon: Nelva Bush, MD;  Location: [place] [facility];  Service: Cardiovascular;  Laterality: N/A;   IR THORACENTESIS ASP PLEURAL SPACE W/IMG GUIDE  01/31/2022   LEFT HEART CATH AND CORONARY ANGIOGRAPHY N/A 11/19/2020   Procedure: LEFT HEART CATH AND CORONARY ANGIOGRAPHY;  Surgeon: Nelva Bush, MD;  Location: [place] [facility];  Service: Cardiovascular;  Laterality: N/A;   LEFT HEART CATH AND CORONARY ANGIOGRAPHY N/A 12/24/2020   Procedure: LEFT HEART CATH AND CORONARY ANGIOGRAPHY;  Surgeon:  Martinique, Peter M, MD;  Location: [place] CV [facility];  Service: Cardiovascular;  Laterality: N/A;   LEFT HEART CATH AND CORONARY ANGIOGRAPHY N/A 12/16/2021   Procedure: LEFT HEART CATH AND CORONARY ANGIOGRAPHY;  Surgeon: Leonie Man, MD;  Location: [place] [facility];  Service: Cardiovascular;  Laterality: N/A;   LUMBAR LAMINECTOMY     SHOULDER  ARTHROSCOPY WITH ROTATOR CUFF REPAIR AND SUBACROMIAL DECOMPRESSION Right 11/23/2018   Procedure: SHOULDER ARTHROSCOPY WITH ROTATOR CUFF REPAIR AND SUBACROMIAL DECOMPRESSION;  Surgeon: [name], MD;  Location: [place];  Service: Orthopedics;  Laterality: Right;   TEE WITHOUT CARDIOVERSION N/A 12/24/2021   Procedure: TRANSESOPHAGEAL ECHOCARDIOGRAM (TEE);  Surgeon: Lajuana Matte, MD;  Location: [place];  Service: Open Heart Surgery;  Laterality: N/A;     A IV Location/Drains/Wounds Patient Lines/Drains/Airways Status     Active Line/Drains/Airways     Name Placement date Placement time Site Days   Peripheral IV 03/17/23 20 G Right;Lateral Antecubital 03/17/23  1454  Antecubital  less than 1            Intake/Output Last 24 hours No intake or output data in the 24 hours ending 03/17/23 1901  Labs/Imaging Results for orders placed or performed during the hospital encounter of 03/17/23 (from the past 48 hour(s))  Resp panel by RT-PCR (RSV, Flu A&B, Covid) Anterior Nasal Swab     Status: None   Collection Time: 03/17/23  2:17 PM   Specimen: Anterior Nasal Swab  Result Value Ref Range   SARS Coronavirus 2 by RT PCR NEGATIVE NEGATIVE    Comment: (NOTE) SARS-CoV-2 target nucleic acids are NOT DETECTED.  The SARS-CoV-2 RNA is generally detectable in upper respiratory specimens during the acute phase of infection. The lowest concentration of SARS-CoV-2 viral copies this assay can detect is 138 copies/mL. A negative result does not preclude SARS-Cov-2 infection and should not be used as the sole basis for treatment or other patient management decisions. A negative result may occur with  improper specimen collection/handling, submission of specimen other than nasopharyngeal swab, presence of viral mutation(s) within the areas targeted by this assay, and inadequate number of viral copies(<138 copies/mL). A negative result must be combined with clinical  observations, patient history, and epidemiological information. The expected result is Negative.  Fact Sheet for Patients:  EntrepreneurPulse.com.au  Fact Sheet for Healthcare Providers:  IncredibleEmployment.be  This test is no t yet approved or cleared by the [place] FDA and  has been authorized for detection and/or diagnosis of SARS-CoV-2 by FDA under an Emergency Use Authorization (EUA). This EUA will remain  in effect (meaning this test can be used) for the duration of the COVID-19 declaration under Section 564(b)(1) of the Act, 21 U.S.C.section 360bbb-3(b)(1), unless the authorization is terminated  or revoked sooner.       Influenza A by PCR NEGATIVE NEGATIVE   Influenza B by PCR NEGATIVE NEGATIVE    Comment: (NOTE) The Xpert Xpress SARS-CoV-2/FLU/RSV plus assay is intended as an aid in the diagnosis of influenza from Nasopharyngeal swab specimens and should not be used as a sole basis for treatment. Nasal washings and aspirates are unacceptable for Xpert Xpress SARS-CoV-2/FLU/RSV testing.  Fact Sheet for Patients: EntrepreneurPulse.com.au  Fact Sheet for Healthcare Providers: IncredibleEmployment.be  This test is not yet approved or cleared by the [place] FDA and has been authorized for detection and/or diagnosis of SARS-CoV-2 by FDA under an Emergency Use Authorization (EUA). This EUA will remain in effect (meaning this  test can be used) for the duration of the COVID-19 declaration under Section 564(b)(1) of the Act, 21 U.S.C. section 360bbb-3(b)(1), unless the authorization is terminated or revoked.     Resp Syncytial Virus by PCR NEGATIVE NEGATIVE    Comment: (NOTE) Fact Sheet for Patients: EntrepreneurPulse.com.au  Fact Sheet for Healthcare Providers: IncredibleEmployment.be  This test is not yet approved or cleared by the [place]  FDA and has been authorized for detection and/or diagnosis of SARS-CoV-2 by FDA under an Emergency Use Authorization (EUA). This EUA will remain in effect (meaning this test can be used) for the duration of the COVID-19 declaration under Section 564(b)(1) of the Act, 21 U.S.C. section 360bbb-3(b)(1), unless the authorization is terminated or revoked.  Performed at [facility], [address] [place], [place] [id number]   Basic metabolic panel     Status: Abnormal   Collection Time: 03/17/23  2:51 PM  Result Value Ref Range   Sodium 133 (L) 135 - 145 mmol/L   Potassium 3.7 3.5 - 5.1 mmol/L   Chloride 101 98 - 111 mmol/L   CO2 24 22 - 32 mmol/L   Glucose, Bld 140 (H) 70 - 99 mg/dL    Comment: Glucose reference range applies only to samples taken after fasting for at least 8 hours.   BUN 27 (H) 8 - 23 mg/dL   Creatinine, Ser 0.90 0.61 - 1.24 mg/dL   Calcium 9.0 8.9 - 10.3 mg/dL   GFR, Estimated >60 >60 mL/min    Comment: (NOTE) Calculated using the CKD-EPI Creatinine Equation (2021)    Anion gap 8 5 - 15    Comment: Performed at [facility], [address] [place], [place] [address]  CBC     Status: Abnormal   Collection Time: 03/17/23  2:51 PM  Result Value Ref Range   WBC 8.8 4.0 - 10.5 K/uL   RBC 5.94 (H) 4.22 - 5.81 MIL/uL   Hemoglobin 16.6 13.0 - 17.0 g/dL   HCT 48.5 39.0 - 52.0 %   MCV 81.6 80.0 - 100.0 fL   MCH 27.9 26.0 - 34.0 pg   MCHC 34.2 30.0 - 36.0 g/dL   RDW 12.8 11.5 - 15.5 %   Platelets 180 150 - 400 K/uL   nRBC 0.0 0.0 - 0.2 %    Comment: Performed at [facility], [address] [place], [place] [address]  Troponin I (High Sensitivity)     Status: None   Collection Time: 03/17/23  2:51 PM  Result Value Ref Range   Troponin I (High Sensitivity) 3 <18 ng/L    Comment: (NOTE) Elevated high sensitivity troponin I (hsTnI) values and significant  changes across serial measurements may suggest ACS but many other  chronic  and acute conditions are known to elevate hsTnI results.  Refer to the "Links" section for chest pain algorithms and additional  guidance. Performed at [facility], [address], [place], [place] [address]   Brain natriuretic peptide     Status: None   Collection Time: 03/17/23  3:00 PM  Result Value Ref Range   B Natriuretic Peptide 45.2 0.0 - 100.0 pg/mL    Comment: Performed at [facility], [address] [place], [place] [address]  Troponin I (High Sensitivity)     Status: None   Collection Time: 03/17/23  4:48 PM  Result Value Ref Range   Troponin I (High Sensitivity) 3 <18  ng/L    Comment: (NOTE) Elevated high sensitivity troponin I (hsTnI) values and significant  changes across serial measurements may suggest ACS but many other  chronic and acute conditions are known to elevate hsTnI results.  Refer to the "Links" section for chest pain algorithms and additional  guidance. Performed at [facility], [address] [place], [place] [address]    *Note: Due to a large number of results and/or encounters for the requested time period, some results have not been displayed. A complete set of results can be found in Results Review.   DG Chest 2 View  Result Date: 03/17/2023 CLINICAL DATA:  Left-sided chest pain for 3 days. Some shortness of breath. EXAM: CHEST - 2 VIEW COMPARISON:  Chest radiographs 09/10/2022 in 05/14/2022; CT chest and chest radiographs 01/24/2022 FINDINGS: Status post median sternotomy. Cardiac silhouette and mediastinal contours are unchanged and within normal limits. Mild-to-moderate calcification within the aortic arch. Left costophrenic angle blunting likely from a combination of the pericardial fat pad and chronic pleural thickening/scarring, unchanged from 09/10/2022 and 05/14/2022. Minimal bilateral lower lung interstitial thickening is unchanged from multiple prior radiographs and chronic. No acute lung opacity. No  pleural effusion or pneumothorax. Mild-to-moderate multilevel degenerative disc changes of the thoracic spine. IMPRESSION: 1. No active cardiopulmonary disease. 2. 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 Electronically Signed   By: Yvonne Kendall M.D.   On: 03/17/2023 14:40    Pending Labs Unresulted Labs (From admission, onward)    None       Vitals/Pain Today's Vitals   03/17/23 1700 03/17/23 1715 03/17/23 1800 03/17/23 1802  BP: 130/89 135/82 129/80   Pulse: 69 70 66   Resp: 13 15 17    Temp:    98.1 F (36.7 C)  TempSrc:    Oral  SpO2: 98% 99% 99%   Weight:      Height:      PainSc:        Isolation Precautions Airborne and Contact precautions  Medications Medications  nitroGLYCERIN (NITROSTAT) SL tablet 0.4 mg (0.4 mg Sublingual Given 03/17/23 1457)    Mobility walks     Focused Assessments Cardiac Assessment Handoff:    Lab Results  Component Value Date   CKTOTAL 50 01/24/2016   CKMB 1.6 10/04/2008   TROPONINI 0.04        NO INDICATION OF MYOCARDIAL INJURY. 10/04/2008   Lab Results  Component Value Date   DDIMER  10/04/2008    0.33        AT THE INHOUSE ESTABLISHED CUTOFF VALUE OF 0.48 ug/mL FEU, THIS ASSAY HAS BEEN DOCUMENTED IN THE LITERATURE TO HAVE   Does the Patient currently have chest pain? No    R Recommendations: See Admitting Provider Note  Report given to:   Additional Notes: Significant prior cardiac hx with multiple stents placed, admit for unstable angina. VSS, A/O x 4, ambulatory and continent b/b. 20ga IV in R AC.

## 2023-03-17 NOTE — ED Triage Notes (Signed)
Constant left sided chest pain x 3 days.  Some sob.  Some nausea per family.  Some diaphoresis at night.  Some runny nose, occasional cough.  No known fever.

## 2023-03-17 NOTE — Telephone Encounter (Signed)
FYI- Pt triaged to EMS 911, states his wife will take him to ED instead.

## 2023-03-17 NOTE — ED Provider Notes (Signed)
[place] [facility] Provider Note   CSN: [id number] Arrival date & time: 03/17/23  1355     History  Chief Complaint  Patient presents with   Chest Pain    Ronald Petersen is a 67 y.o. male.  With past medical history significant for hypertension, hyperlipidemia, diabetes, previous CABG, CAD, multiple stents in place who is currently on aspirin, Plavix who presents with concern for left-sided chest pain with exertion associated with some shortness of breath.  He endorses occasional nausea.  He reports sometimes feeling diaphoretic at night when laying down.  Also endorses some runny nose and occasional cough recently.  Denies any recent fever.   Chest Pain Associated symptoms: shortness of breath        Home Medications Prior to Admission medications   Medication Sig Start Date End Date Taking? Authorizing Provider  acetaminophen (TYLENOL) 650 MG CR tablet Take 650 mg by mouth every 8 (eight) hours as needed for pain.    [provider]  aspirin EC 325 MG EC tablet Take 1 tablet (325 mg total) by mouth daily. 12/28/21   Gold, Wayne E, PA-C  ELDERBERRY PO Take 300 mg by mouth daily.    [provider]  empagliflozin (JARDIANCE) 10 MG TABS tablet Take 1 tablet (10 mg total) by mouth daily. 02/23/23   Donato Schultz, DO  ezetimibe (ZETIA) 10 MG tablet Take 1 tablet (10 mg total) by mouth daily. For cholesterol 02/18/23   [name], Yvonne R, DO  famotidine (PEPCID) 20 MG tablet Take 1 tablet (20 mg total) by mouth daily. 12/16/22   Seabron Spates R, DO  fenofibrate 160 MG tablet Take 1 tablet (160 mg total) by mouth daily. 02/23/23   Donato Schultz, DO  gabapentin (NEURONTIN) 800 MG tablet Take 1 tablet (800 mg total) by mouth 3 (three) times daily. 08/20/22   Hyatt, Max T, DPM  glucose blood (ONETOUCH VERIO) test strip USE AS INSTRUCTED TO CHECK BLOOD SUGAR ONCE A DAY 01/23/22 04/24/23  Shamleffer, Konrad Dolores, MD  Magnesium Citrate 200 MG TABS Take 400 mg by mouth daily.    [provider]  ondansetron (ZOFRAN) 4 MG tablet Take 1 tablet (4 mg total) by mouth every 8 (eight) hours as needed. 02/23/23 02/23/24  Donato Schultz, DO  pantoprazole (PROTONIX) 40 MG tablet Take 1 tablet (40 mg total) by mouth 2 (two) times daily. 02/17/23   Donato Schultz, DO  sertraline (ZOLOFT) 100 MG tablet Take 1.5 tablets (150 mg total) by mouth daily. 02/20/23   Donato Schultz, DO  tiZANidine (ZANAFLEX) 4 MG tablet Take 1 tablet (4 mg total) by mouth every 6 (six) hours as needed for muscle spasms. 02/23/23   Donato Schultz, DO  traZODone (DESYREL) 50 MG tablet Take 1 tablet (50 mg total) by mouth at bedtime as needed for sleep. 02/17/23   [name], Yvonne R, DO  umeclidinium-vilanterol (ANORO ELLIPTA) 62.5-25 MCG/ACT AEPB Inhale 1 puff into the lungs daily. 02/17/23 02/12/24  Josephine Igo, DO  vitamin E 180 MG (400 UNITS) capsule Take 1,600 Units by mouth daily.    [provider]      Allergies    Niacin    Review of Systems   Review of Systems  Respiratory:  Positive for shortness of breath.   Cardiovascular:  Positive for chest pain.  All other systems reviewed and are negative.  Physical Exam Updated Vital Signs BP (!) 104/54 (BP Location: Right Arm)   Pulse 60   Temp 97.7 F (36.5 C) (Oral)   Resp 18   Ht 5\' 11"  (1.803 m)   Wt 89.3 kg   SpO2 93%   BMI 27.46 kg/m  Physical Exam Vitals and nursing note reviewed.  Constitutional:      General: He is not in acute distress.    Appearance: Normal appearance.  HENT:     Head: Normocephalic and atraumatic.  Eyes:     General:        Right eye: No discharge.        Left eye: No discharge.  Cardiovascular:     Rate and Rhythm: Normal rate and regular rhythm.     Heart sounds: No murmur heard.    No friction rub. No gallop.  Pulmonary:     Effort: Pulmonary effort is normal.     Breath  sounds: Normal breath sounds.  Chest:     Comments: No significant TTP of chest wall Abdominal:     General: Bowel sounds are normal.     Palpations: Abdomen is soft.  Skin:    General: Skin is warm and dry.     Capillary Refill: Capillary refill takes less than 2 seconds.  Neurological:     Mental Status: He is alert and oriented to person, place, and time.  Psychiatric:        Mood and Affect: Mood normal.        Behavior: Behavior normal.     ED Results / Procedures / Treatments   Labs (all labs ordered are listed, but only abnormal results are displayed) Labs Reviewed  BASIC METABOLIC PANEL - Abnormal; Notable for the following components:      Result Value   Sodium 133 (*)    Glucose, Bld 140 (*)    BUN 27 (*)    All other components within normal limits  CBC - Abnormal; Notable for the following components:   RBC 5.94 (*)    All other components within normal limits  GLUCOSE, CAPILLARY - Abnormal; Notable for the following components:   Glucose-Capillary 174 (*)    All other components within normal limits  GLUCOSE, CAPILLARY - Abnormal; Notable for the following components:   Glucose-Capillary 138 (*)    All other components within normal limits  RESP PANEL BY RT-PCR (RSV, FLU A&B, COVID)  RVPGX2  BRAIN NATRIURETIC PEPTIDE  HIV ANTIBODY (ROUTINE TESTING W REFLEX)  TROPONIN I (HIGH SENSITIVITY)  TROPONIN I (HIGH SENSITIVITY)    EKG EKG Interpretation  Date/Time:  Tuesday March 17 2023 14:01:57 EDT Ventricular Rate:  66 PR Interval:  153 QRS Duration: 110 QT Interval:  404 QTC Calculation: 424 R Axis:   123 Text Interpretation: Sinus rhythm Probable left atrial enlargement Right axis deviation Confirmed by Glyn Ade [phone]) on 03/17/2023 2:16:03 PM  Radiology DG Chest 2 View  Result Date: 03/17/2023 CLINICAL DATA:  Left-sided chest pain for 3 days. Some shortness of breath. EXAM: CHEST - 2 VIEW COMPARISON:  Chest radiographs 09/10/2022 in  05/14/2022; CT chest and chest radiographs 01/24/2022 FINDINGS: Status post median sternotomy. Cardiac silhouette and mediastinal contours are unchanged and within normal limits. Mild-to-moderate calcification within the aortic arch. Left costophrenic angle blunting likely from a combination of the pericardial fat pad and chronic pleural thickening/scarring, unchanged from 09/10/2022 and 05/14/2022. Minimal bilateral lower lung interstitial thickening is unchanged from multiple prior radiographs and chronic. No acute lung  opacity. No pleural effusion or pneumothorax. Mild-to-moderate multilevel degenerative disc changes of the thoracic spine. IMPRESSION: 1. No active cardiopulmonary disease. 2. 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 Electronically Signed   By: Neita Garnet M.D.   On: 03/17/2023 14:40    Procedures Procedures    Medications Ordered in ED Medications  aspirin EC tablet 325 mg (325 mg Oral Given 03/17/23 2317)  traZODone (DESYREL) tablet 50 mg (50 mg Oral Given 03/18/23 0041)  sertraline (ZOLOFT) tablet 150 mg (has no administration in time range)  pantoprazole (PROTONIX) EC tablet 40 mg (has no administration in time range)  gabapentin (NEURONTIN) capsule 400 mg (400 mg Oral Given 03/17/23 2316)  tiZANidine (ZANAFLEX) tablet 4 mg (4 mg Oral Given 03/18/23 0041)  umeclidinium-vilanterol (ANORO ELLIPTA) 62.5-25 MCG/ACT 1 puff (has no administration in time range)  acetaminophen (TYLENOL) tablet 650 mg (has no administration in time range)  ondansetron (ZOFRAN) injection 4 mg (has no administration in time range)  insulin aspart (novoLOG) injection 0-6 Units ( Subcutaneous Not Given 03/18/23 0808)  insulin aspart (novoLOG) injection 0-5 Units ( Subcutaneous Not Given 03/17/23 2314)  enoxaparin (LOVENOX) injection 40 mg (40 mg Subcutaneous Given 03/17/23 2317)  metoprolol tartrate (LOPRESSOR) tablet 12.5 mg (12.5 mg Oral Given 03/17/23 2316)   oxyCODONE (Oxy IR/ROXICODONE) immediate release tablet 5 mg (has no administration in time range)    ED Course/ Medical Decision Making/ A&P Clinical Course as of 03/18/23 0852  Tue Mar 17, 2023  1414 55 male present with chief complaint of chest pain.  Extensive history of CAD.  Concerning story per presentation today. [CC]  1427 Evaluated personally at bedside.  67 year old male with extensive medical problems.  History of CAD status post bypass in 2022, PCI in early 2000's.  State he has 2 known vessel occlusions that are too small for intervention per his last cardiology follow-up.  Chief complaint is 3 days of consistent chest pain.  It is exertional in nature in his left chest.  Denies syncope or shortness of breath at this time.  Otherwise ambulatory tolerating p.o. intake.  No nitro prior to arrival has taken aspirin at home without symptomatic improvement. Had a long shared medical decision making conversation with patient.  Given his elevated risk for coronary artery disease including ACS, emergency department evaluation is not going to build to clear patient fully for risk of ACS.  He stated he would consent to serial troponins but wants to avoid admission if at all possible today.  States he prefer to follow-up with his cardiologist if labs are negative.  Informed patient that he is at risk for death from ACS even with serial negative troponins today and he said he would talk to his wife about it. Workup is pending at time of handoff to oncoming team, favor that objective evaluation will likely be reassuring given initial EKG.  If symptoms resolved troponins are negative and patient wishes to participate in a patient directed discharge this could be reasonable. However we would recommend for admission for ongoing observation and further cardiac restratification. [CC]    Clinical Course User Index [CC] Glyn Ade, MD                             Medical Decision Making Amount  and/or Complexity of Data Reviewed Labs: ordered. Radiology: ordered.  Risk Decision regarding hospitalization.   This patient is a 67 y.o. male who presents to the  ED for concern of chest pain, this involves an extensive number of treatment options, and is a complaint that carries with it a high risk of complications and morbidity. The emergent differential diagnosis prior to evaluation includes, but is not limited to,  ACS, AAS, PE, Mallory-Weiss, Boerhaave's, Pneumonia, acute bronchitis, asthma or COPD exacerbation, anxiety, MSK pain or traumatic injury to the chest, acid reflux versus other . This is not an exhaustive differential.   Past Medical History / Co-morbidities / Social History: hypertension, hyperlipidemia, diabetes, previous CABG, CAD, multiple stents in place  Additional history: Chart reviewed. Pertinent results include: Reviewed previous cardiac procedures including left heart cath from 2022, and echo from December 22.  Physical Exam: Physical exam performed. The pertinent findings include: Patient is overall well-appearing, he arrives somewhat hypertensive, blood pressure 148/87 on arrival, with blood pressure max in the emergency department 178/87.  He is otherwise with stable vital signs.  He has no significant tenderness palpation of the chest wall.  Lab Tests: I ordered, and personally interpreted labs.  The pertinent results include: BMP notable for mild hyponatremia, sodium 133, mild hyperglycemia, glucose 140, elevated BUN at 27 but normal creatinine at 0.9.  His CBC is unremarkable, BMP unremarkable, his troponins are negative x 2, flat 3.  His RVP is negative for COVID, flu, RSV.   Imaging Studies: I ordered imaging studies including plain film chest x-ray. I independently visualized and interpreted imaging which showed no acute intrathoracic abnormality. I agree with the radiologist interpretation.   Cardiac Monitoring:  The patient was maintained on a cardiac  monitor.  My attending physician Dr. Doran Durand viewed and interpreted the cardiac monitored which showed an underlying rhythm of: NSR. I agree with this interpretation.   Medications: I ordered medication including nitroglycerin for chest pain which seems cardiac in nature in nature.  She was some improvement on reevaluation, however I think that he is likely to need admission and will appreciate recommendations for ongoing medication from admitting team and cardiology at that time.  Consultations Obtained: I requested consultation with the hospitalist, spoke with Dr. Allena Katz,  and discussed lab and imaging findings as well as pertinent plan - they recommend: After discussion of patient's case, findings, he would like me to consult with cardiology to see whether patient could be a candidate for restarting his long-acting nitrate Ranexa instead of being admitted to the hospital, consult for cardiology pending at time of my handoff.   Disposition: After consideration of the diagnostic results and the patients response to treatment, I feel that is are consistent with unstable angina with high risk cardiac history, I think that he would be a good candidate for admission,.   Care of Ronald Petersen transferred to [facility] [employer] and Dr. Dalene Seltzer at the end of my shift as the patient will require reassessment once labs/imaging have resulted. Patient presentation, ED course, and plan of care discussed with review of all pertinent labs and imaging. Please see his/her note for further details regarding further ED course and disposition. Plan at time of handoff is pending cardiology consult, if they feel that he can restart his long-acting nitrate patient may be stable for discharge, otherwise I think that he needs to be admitted for unstable angina. This may be altered or completely changed at the discretion of the oncoming team pending results of further workup.   Final Clinical Impression(s) / ED  Diagnoses Final diagnoses:  None    Rx / DC Orders ED Discharge Orders  None         [place] 03/18/23 [id number]    Glyn Ade, MD 03/18/23 [phone]

## 2023-03-17 NOTE — H&P (Signed)
History and Physical    Ronald Petersen [id number] DOB: August 20, 1956 DOA: 03/17/2023  PCP: [name], DO   Patient coming from: Home   Chief Complaint: Chest pain   HPI: Ronald Petersen is a pleasant 67 y.o. male with medical history significant for type 2 diabetes mellitus, hypertension, hyperlipidemia, OSA not on CPAP, COPD, and CAD status post CABG in December 2022 who now presents to the emergency department with chest pain.  Patient reports 3 days of pain in his left chest with radiation to the proximal left upper extremity, mild shortness of breath, and occasional diaphoresis.  Chest discomfort has been essentially constant for the past 3 days but is worse with any activity and improved with nitroglycerin in the ED.  Patient notes that his blood pressure has been elevated the past 3 days.  He states that he had been off of metoprolol and some of his other medications for over a year now, but notes that his blood pressure has been well-controlled up until recently.  [facility] ED Course: Upon arrival to the ED, patient is found to be afebrile and saturating well on room air with elevated blood pressure and normal heart rate.  EKG demonstrates sinus rhythm with RAD.  Troponin is normal x 2 and BNP is normal.  BUN to creatinine ratio is elevated.  No acute findings noted on chest x-ray.  Patient was given nitroglycerin in the emergency department.  Cardiology was consulted by the ED PA and recommended medical admission.  Review of Systems:  All other systems reviewed and apart from HPI, are negative.  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   Cancer [facility])    Complication of anesthesia    aspirated with back surgery at age 29   Depression    Diabetes mellitus Type II    GERD (gastroesophageal reflux disease)    Hyperlipidemia    Hypertension    Neuromuscular disorder ([place])    NEUROPATHY    Right rotator cuff tear 11/23/2018   Sleep apnea    no CPAP    Past Surgical History:  Procedure Laterality Date   APPENDECTOMY     ARTHOSCOPIC ROTAOR CUFF REPAIR Right 11/23/2018   Procedure: ARTHROSCOPIC ROTATOR CUFF REPAIR;  Surgeon: [name], MD;  Location: [place];  Service: Orthopedics;  Laterality: Right;   CHOLECYSTECTOMY     CORONARY ARTERY BYPASS GRAFT N/A 12/24/2021   x2 LIMA to LAD; SVG to Obtuse Marginal   CORONARY STENT INTERVENTION N/A 11/19/2020   Procedure: CORONARY STENT INTERVENTION;  Surgeon: Nelva Bush, MD;  Location: [place] [facility];  Service: Cardiovascular;  Laterality: N/A;   ENDOVEIN HARVEST OF GREATER SAPHENOUS VEIN Right 12/24/2021   Procedure: ENDOVEIN HARVEST OF GREATER SAPHENOUS VEIN;  Surgeon: Lajuana Matte, MD;  Location: [place];  Service: Open Heart Surgery;  Laterality: Right;   INTRAVASCULAR ULTRASOUND/IVUS N/A 11/19/2020   Procedure: Intravascular Ultrasound/IVUS;  Surgeon: Nelva Bush, MD;  Location: [place] [facility];  Service: Cardiovascular;  Laterality: N/A;   IR THORACENTESIS ASP PLEURAL SPACE W/IMG GUIDE  01/31/2022   LEFT HEART CATH AND CORONARY ANGIOGRAPHY N/A 11/19/2020   Procedure: LEFT HEART CATH AND CORONARY ANGIOGRAPHY;  Surgeon: Nelva Bush, MD;  Location: [place] [facility];  Service: Cardiovascular;  Laterality: N/A;   LEFT HEART CATH AND CORONARY ANGIOGRAPHY N/A 12/24/2020  Procedure: LEFT HEART CATH AND CORONARY ANGIOGRAPHY;  Surgeon: Martinique, Peter M, MD;  Location: [place] [facility];  Service: Cardiovascular;  Laterality: N/A;   LEFT HEART CATH AND CORONARY ANGIOGRAPHY N/A 12/16/2021   Procedure: LEFT HEART CATH AND CORONARY ANGIOGRAPHY;  Surgeon: Leonie Man, MD;  Location: [place] [facility];  Service: Cardiovascular;  Laterality: N/A;   LUMBAR LAMINECTOMY     SHOULDER ARTHROSCOPY WITH ROTATOR CUFF REPAIR AND SUBACROMIAL DECOMPRESSION Right 11/23/2018   Procedure: SHOULDER  ARTHROSCOPY WITH ROTATOR CUFF REPAIR AND SUBACROMIAL DECOMPRESSION;  Surgeon: [name], MD;  Location: [place];  Service: Orthopedics;  Laterality: Right;   TEE WITHOUT CARDIOVERSION N/A 12/24/2021   Procedure: TRANSESOPHAGEAL ECHOCARDIOGRAM (TEE);  Surgeon: Lajuana Matte, MD;  Location: [place];  Service: Open Heart Surgery;  Laterality: N/A;    Social History:   reports that he quit smoking about 37 years ago. His smoking use included cigarettes. He has never used smokeless tobacco. He reports that he does not currently use alcohol. He reports that he does not use drugs.  Allergies  Allergen Reactions   Niacin Anaphylaxis    Flushing - required ED visit    Family History  Problem Relation Age of Onset   COPD Mother    Stroke Father    Ovarian cancer Sister    Stomach cancer Sister    Heart disease Sister        MI   Heart disease Sister        MI   Stomach cancer Maternal Grandmother    Alcohol abuse Other    Depression Other    Arthritis Other    Hypertension Other    Coronary artery disease Other    Ovarian cancer Other        neice   Ovarian cancer Other        neice   Colon polyps Neg Hx    Colon cancer Neg Hx      Prior to Admission medications   Medication Sig Start Date End Date Taking? Authorizing Provider  acetaminophen (TYLENOL) 650 MG CR tablet Take 650 mg by mouth every 8 (eight) hours as needed for pain.    [provider]  aspirin EC 325 MG EC tablet Take 1 tablet (325 mg total) by mouth daily. 12/28/21   Gold, Wayne E, PA-C  ELDERBERRY PO Take 300 mg by mouth daily.    [provider]  empagliflozin (JARDIANCE) 10 MG TABS tablet Take 1 tablet (10 mg total) by mouth daily. 02/23/23   [name], DO  ezetimibe (ZETIA) 10 MG tablet Take 1 tablet (10 mg total) by mouth daily. For cholesterol 02/18/23   [name], Yvonne R, DO  famotidine (PEPCID) 20 MG tablet Take 1 tablet (20 mg total) by mouth  daily. 12/16/22   Roma Schanz R, DO  fenofibrate 160 MG tablet Take 1 tablet (160 mg total) by mouth daily. 02/23/23   [name], DO  gabapentin (NEURONTIN) 800 MG tablet Take 1 tablet (800 mg total) by mouth 3 (three) times daily. 08/20/22   Hyatt, Max T, DPM  glucose blood (ONETOUCH VERIO) test strip USE AS INSTRUCTED TO CHECK BLOOD SUGAR ONCE A DAY 01/23/22 04/24/23  Shamleffer, Melanie Crazier, MD  Magnesium Citrate 200 MG TABS Take 400 mg by mouth daily.    [provider]  ondansetron (ZOFRAN) 4 MG tablet Take 1 tablet (4 mg total) by mouth every 8 (eight) hours  as needed. 02/23/23 02/23/24  [name], DO  pantoprazole (PROTONIX) 40 MG tablet Take 1 tablet (40 mg total) by mouth 2 (two) times daily. 02/17/23   [name], DO  sertraline (ZOLOFT) 100 MG tablet Take 1.5 tablets (150 mg total) by mouth daily. 02/20/23   [name], DO  tiZANidine (ZANAFLEX) 4 MG tablet Take 1 tablet (4 mg total) by mouth every 6 (six) hours as needed for muscle spasms. 02/23/23   [name], DO  traZODone (DESYREL) 50 MG tablet Take 1 tablet (50 mg total) by mouth at bedtime as needed for sleep. 02/17/23   [name], Yvonne R, DO  umeclidinium-vilanterol (ANORO ELLIPTA) 62.5-25 MCG/ACT AEPB Inhale 1 puff into the lungs daily. 02/17/23 02/12/24  Garner Nash, DO  vitamin E 180 MG (400 UNITS) capsule Take 1,600 Units by mouth daily.    [provider]    Physical Exam: Vitals:   03/17/23 2030 03/17/23 2100 03/17/23 2127 03/17/23 2211  BP: 127/79 (!) 147/81  (!) 178/87  Pulse: 72 72  72  Resp: 19 16  18   Temp:   98.2 F (36.8 C) 97.6 F (36.4 C)  TempSrc:   Oral Oral  SpO2: 99% 97%  95%  Weight:    89.3 kg  Height:    5\' 11"  (1.803 m)    Constitutional: NAD, calm  Eyes: PERTLA, lids and conjunctivae normal ENMT: Mucous membranes are moist. Posterior pharynx clear of any exudate or lesions.   Neck: supple, no masses   Respiratory: no wheezing, no crackles. No accessory muscle use.  Cardiovascular: S1 & S2 heard, regular rate and rhythm. No extremity edema.   Abdomen: No distension, no tenderness, soft. Bowel sounds active.  Musculoskeletal: no clubbing / cyanosis. No joint deformity upper and lower extremities.   Skin: no significant rashes, lesions, ulcers. Warm, dry, well-perfused. Neurologic: CN 2-12 grossly intact. Moving all extremities. Alert and oriented.  Psychiatric: Pleasant. Cooperative.    Labs and Imaging on Admission: I have personally reviewed following labs and imaging studies  CBC: Recent Labs  Lab 03/17/23 1451  WBC 8.8  HGB 16.6  HCT 48.5  MCV 81.6  PLT 99991111   Basic Metabolic Panel: Recent Labs  Lab 03/17/23 1451  NA 133*  K 3.7  CL 101  CO2 24  GLUCOSE 140*  BUN 27*  CREATININE 0.90  CALCIUM 9.0   GFR: Estimated Creatinine Clearance: 84.8 mL/min (by C-G formula based on SCr of 0.9 mg/dL). Liver Function Tests: No results for input(s): "AST", "ALT", "ALKPHOS", "BILITOT", "PROT", "ALBUMIN" in the last 168 hours. No results for input(s): "LIPASE", "AMYLASE" in the last 168 hours. No results for input(s): "AMMONIA" in the last 168 hours. Coagulation Profile: No results for input(s): "INR", "PROTIME" in the last 168 hours. Cardiac Enzymes: No results for input(s): "CKTOTAL", "CKMB", "CKMBINDEX", "TROPONINI" in the last 168 hours. BNP (last 3 results) No results for input(s): "PROBNP" in the last 8760 hours. HbA1C: No results for input(s): "HGBA1C" in the last 72 hours. CBG: Recent Labs  Lab 03/17/23 2239  GLUCAP 174*   Lipid Profile: No results for input(s): "CHOL", "HDL", "LDLCALC", "TRIG", "CHOLHDL", "LDLDIRECT" in the last 72 hours. Thyroid Function Tests: No results for input(s): "TSH", "T4TOTAL", "FREET4", "T3FREE", "THYROIDAB" in the last 72 hours. Anemia Panel: No results for input(s): "VITAMINB12", "FOLATE", "FERRITIN", "TIBC", "IRON", "RETICCTPCT"  in the last 72 hours. Urine analysis:    Component Value Date/Time   COLORURINE  YELLOW 12/23/2021 1120   APPEARANCEUR CLEAR 12/23/2021 1120   LABSPEC 1.015 12/23/2021 1120   PHURINE 7.5 12/23/2021 1120   GLUCOSEU 250 (A) 12/23/2021 1120   HGBUR NEGATIVE 12/23/2021 1120   HGBUR small 11/05/2010 0941   BILIRUBINUR NEGATIVE 12/23/2021 1120   BILIRUBINUR 1+ 06/07/2021 1403   KETONESUR NEGATIVE 12/23/2021 1120   PROTEINUR NEGATIVE 12/23/2021 1120   UROBILINOGEN 2.0 (A) 06/07/2021 1403   UROBILINOGEN negative 11/05/2010 0941   NITRITE NEGATIVE 12/23/2021 1120   LEUKOCYTESUR NEGATIVE 12/23/2021 1120   Sepsis Labs: @LABRCNTIP (procalcitonin:4,lacticidven:4) ) Recent Results (from the past 240 hour(s))  Resp panel by RT-PCR (RSV, Flu A&B, Covid) Anterior Nasal Swab     Status: None   Collection Time: 03/17/23  2:17 PM   Specimen: Anterior Nasal Swab  Result Value Ref Range Status   SARS Coronavirus 2 by RT PCR NEGATIVE NEGATIVE Final    Comment: (NOTE) SARS-CoV-2 target nucleic acids are NOT DETECTED.  The SARS-CoV-2 RNA is generally detectable in upper respiratory specimens during the acute phase of infection. The lowest concentration of SARS-CoV-2 viral copies this assay can detect is 138 copies/mL. A negative result does not preclude SARS-Cov-2 infection and should not be used as the sole basis for treatment or other patient management decisions. A negative result may occur with  improper specimen collection/handling, submission of specimen other than nasopharyngeal swab, presence of viral mutation(s) within the areas targeted by this assay, and inadequate number of viral copies(<138 copies/mL). A negative result must be combined with clinical observations, patient history, and epidemiological information. The expected result is Negative.  Fact Sheet for Patients:  EntrepreneurPulse.com.au  Fact Sheet for Healthcare Providers:   IncredibleEmployment.be  This test is no t yet approved or cleared by the [place] FDA and  has been authorized for detection and/or diagnosis of SARS-CoV-2 by FDA under an Emergency Use Authorization (EUA). This EUA will remain  in effect (meaning this test can be used) for the duration of the COVID-19 declaration under Section 564(b)(1) of the Act, 21 U.S.C.section 360bbb-3(b)(1), unless the authorization is terminated  or revoked sooner.       Influenza A by PCR NEGATIVE NEGATIVE Final   Influenza B by PCR NEGATIVE NEGATIVE Final    Comment: (NOTE) The Xpert Xpress SARS-CoV-2/FLU/RSV plus assay is intended as an aid in the diagnosis of influenza from Nasopharyngeal swab specimens and should not be used as a sole basis for treatment. Nasal washings and aspirates are unacceptable for Xpert Xpress SARS-CoV-2/FLU/RSV testing.  Fact Sheet for Patients: EntrepreneurPulse.com.au  Fact Sheet for Healthcare Providers: IncredibleEmployment.be  This test is not yet approved or cleared by the [place] FDA and has been authorized for detection and/or diagnosis of SARS-CoV-2 by FDA under an Emergency Use Authorization (EUA). This EUA will remain in effect (meaning this test can be used) for the duration of the COVID-19 declaration under Section 564(b)(1) of the Act, 21 U.S.C. section 360bbb-3(b)(1), unless the authorization is terminated or revoked.     Resp Syncytial Virus by PCR NEGATIVE NEGATIVE Final    Comment: (NOTE) Fact Sheet for Patients: EntrepreneurPulse.com.au  Fact Sheet for Healthcare Providers: IncredibleEmployment.be  This test is not yet approved or cleared by the [place] FDA and has been authorized for detection and/or diagnosis of SARS-CoV-2 by FDA under an Emergency Use Authorization (EUA). This EUA will remain in effect (meaning this test can be used) for  the duration of the COVID-19 declaration under Section 564(b)(1) of the Act, 21 U.S.C. section  360bbb-3(b)(1), unless the authorization is terminated or revoked.  Performed at [facility], [address] [place], [place] [address]      Radiological Exams on Admission: DG Chest 2 View  Result Date: 03/17/2023 CLINICAL DATA:  Left-sided chest pain for 3 days. Some shortness of breath. EXAM: CHEST - 2 VIEW COMPARISON:  Chest radiographs 09/10/2022 in 05/14/2022; CT chest and chest radiographs 01/24/2022 FINDINGS: Status post median sternotomy. Cardiac silhouette and mediastinal contours are unchanged and within normal limits. Mild-to-moderate calcification within the aortic arch. Left costophrenic angle blunting likely from a combination of the pericardial fat pad and chronic pleural thickening/scarring, unchanged from 09/10/2022 and 05/14/2022. Minimal bilateral lower lung interstitial thickening is unchanged from multiple prior radiographs and chronic. No acute lung opacity. No pleural effusion or pneumothorax. Mild-to-moderate multilevel degenerative disc changes of the thoracic spine. IMPRESSION: 1. No active cardiopulmonary disease. 2. 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 Electronically Signed   By: Yvonne Kendall M.D.   On: 03/17/2023 14:40    EKG: Independently reviewed. Sinus rhythm, RAD.   Assessment/Plan   1. Chest pain  - No acute ischemic features noted on EKG, troponin normal x2, and no acute CXR findings in ED  - Continue cardiac monitoring, continue ASA, continue NTG as needed, resume metoprolol for elevated BPs, follow-up cardiology recommendations   2. Hypertension  - BP elevated in ED; pt reports being off of antihypertensives for >1 yr  - Resume metoprolol for now   3. Type II DM  - Check CBGs and use low-intensity SSI for now    4. COPD  - Not in exacerbation on admission  - Continue LAMA-LABA    DVT  prophylaxis: Lovenox  Code Status: Full  Level of Care: Level of care: Progressive Family Communication: none present  Disposition Plan:  Patient is from: Home  Anticipated d/c is to: Home  Anticipated d/c date is: Possibly as early as 3/20 or 03/19/23  Patient currently: Pending cardiology consultation  Consults called: Cardiology  Admission status: Inpatient     Vianne Bulls, MD [facility]  03/17/2023, 11:37 PM

## 2023-03-17 NOTE — Telephone Encounter (Signed)
Pt called stating he was experiencing the following:  -Elevated BP: Last Reading was 150/94  -Headache  -Chest Pain  Pt was transferred to triage nurse for further eval.

## 2023-03-17 NOTE — ED Notes (Signed)
Called [facility] for 2nd consult to Hospitalist @!6:55pm.  Spoke with Ronald Petersen

## 2023-03-18 ENCOUNTER — Encounter (HOSPITAL_COMMUNITY): Payer: Self-pay | Admitting: Family Medicine

## 2023-03-18 ENCOUNTER — Encounter (HOSPITAL_COMMUNITY)
Admission: EM | Disposition: A | Discharge status: Home / Self Care | Payer: Self-pay | Source: Home / Self Care | Attending: Emergency Medicine

## 2023-03-18 DIAGNOSIS — E11649 Type 2 diabetes mellitus with hypoglycemia without coma: Secondary | ICD-10-CM

## 2023-03-18 DIAGNOSIS — I159 Secondary hypertension, unspecified: Secondary | ICD-10-CM | POA: Diagnosis not present

## 2023-03-18 DIAGNOSIS — I200 Unstable angina: Secondary | ICD-10-CM

## 2023-03-18 DIAGNOSIS — R109 Unspecified abdominal pain: Secondary | ICD-10-CM | POA: Insufficient documentation

## 2023-03-18 DIAGNOSIS — I2510 Atherosclerotic heart disease of native coronary artery without angina pectoris: Secondary | ICD-10-CM | POA: Diagnosis not present

## 2023-03-18 DIAGNOSIS — R079 Chest pain, unspecified: Secondary | ICD-10-CM

## 2023-03-18 DIAGNOSIS — Z9861 Coronary angioplasty status: Secondary | ICD-10-CM | POA: Diagnosis not present

## 2023-03-18 DIAGNOSIS — E1142 Type 2 diabetes mellitus with diabetic polyneuropathy: Secondary | ICD-10-CM | POA: Diagnosis not present

## 2023-03-18 DIAGNOSIS — E785 Hyperlipidemia, unspecified: Secondary | ICD-10-CM

## 2023-03-18 DIAGNOSIS — G894 Chronic pain syndrome: Secondary | ICD-10-CM | POA: Diagnosis not present

## 2023-03-18 DIAGNOSIS — E1169 Type 2 diabetes mellitus with other specified complication: Secondary | ICD-10-CM | POA: Diagnosis not present

## 2023-03-18 DIAGNOSIS — R1013 Epigastric pain: Secondary | ICD-10-CM | POA: Diagnosis not present

## 2023-03-18 DIAGNOSIS — G4733 Obstructive sleep apnea (adult) (pediatric): Secondary | ICD-10-CM | POA: Diagnosis not present

## 2023-03-18 DIAGNOSIS — I25110 Atherosclerotic heart disease of native coronary artery with unstable angina pectoris: Secondary | ICD-10-CM | POA: Diagnosis not present

## 2023-03-18 HISTORY — PX: LEFT HEART CATH AND CORS/GRAFTS ANGIOGRAPHY: CATH118250

## 2023-03-18 LAB — GLUCOSE, CAPILLARY
Glucose-Capillary: 138 mg/dL — ABNORMAL HIGH (ref 70–99)
Glucose-Capillary: 132 mg/dL — ABNORMAL HIGH (ref 70–99)
Glucose-Capillary: 100 mg/dL — ABNORMAL HIGH (ref 70–99)

## 2023-03-18 LAB — HIV ANTIBODY (ROUTINE TESTING W REFLEX): HIV Screen 4th Generation wRfx: NONREACTIVE

## 2023-03-18 LAB — TSH: TSH: 1.971 u[IU]/mL (ref 0.350–4.500)

## 2023-03-18 SURGERY — LEFT HEART CATH AND CORS/GRAFTS ANGIOGRAPHY
Anesthesia: LOCAL

## 2023-03-18 MED ORDER — HEPARIN SODIUM (PORCINE) 1000 UNIT/ML IJ SOLN
INTRAMUSCULAR | Status: AC
  Filled 2023-03-18: qty 10

## 2023-03-18 MED ORDER — SODIUM CHLORIDE 0.9% FLUSH: 3.0000 mL | Freq: Two times a day (BID) | INTRAVENOUS | Status: DC

## 2023-03-18 MED ORDER — SODIUM CHLORIDE 0.9% FLUSH: 3.0000 mL | INTRAVENOUS | Status: DC | PRN

## 2023-03-18 MED ORDER — VERAPAMIL HCL 2.5 MG/ML IV SOLN
INTRAVENOUS | Status: DC | PRN
  Administered 2023-03-18: 10 mL via INTRA_ARTERIAL

## 2023-03-18 MED ORDER — LIDOCAINE HCL (PF) 1 % IJ SOLN
INTRAMUSCULAR | Status: AC
  Filled 2023-03-18: qty 30

## 2023-03-18 MED ORDER — FENTANYL CITRATE (PF) 100 MCG/2ML IJ SOLN
INTRAMUSCULAR | Status: AC
  Filled 2023-03-18: qty 2

## 2023-03-18 MED ORDER — HEPARIN SODIUM (PORCINE) 1000 UNIT/ML IJ SOLN
INTRAMUSCULAR | Status: DC | PRN
  Administered 2023-03-18: 4500 [IU] via INTRAVENOUS

## 2023-03-18 MED ORDER — FENTANYL CITRATE (PF) 100 MCG/2ML IJ SOLN
INTRAMUSCULAR | Status: DC | PRN
  Administered 2023-03-18: 25 ug via INTRAVENOUS

## 2023-03-18 MED ORDER — HEPARIN (PORCINE) IN NACL 1000-0.9 UT/500ML-% IV SOLN
INTRAVENOUS | Status: DC | PRN
  Administered 2023-03-18 (×2): 500 mL

## 2023-03-18 MED ORDER — SODIUM CHLORIDE 0.9 % IV SOLN: INTRAVENOUS | Status: DC

## 2023-03-18 MED ORDER — VERAPAMIL HCL 2.5 MG/ML IV SOLN
INTRAVENOUS | Status: AC
  Filled 2023-03-18: qty 2

## 2023-03-18 MED ORDER — IOHEXOL 350 MG/ML SOLN
INTRAVENOUS | Status: DC | PRN
  Administered 2023-03-18: 80 mL

## 2023-03-18 MED ORDER — FENOFIBRATE 160 MG PO TABS
160.0000 mg | ORAL_TABLET | Freq: Every day | ORAL | Status: DC
  Administered 2023-03-18: 160 mg via ORAL
  Filled 2023-03-18: qty 1

## 2023-03-18 MED ORDER — EZETIMIBE 10 MG PO TABS
10.0000 mg | ORAL_TABLET | Freq: Every day | ORAL | Status: DC
  Administered 2023-03-18: 10 mg via ORAL
  Filled 2023-03-18: qty 1

## 2023-03-18 MED ORDER — LIDOCAINE HCL (PF) 1 % IJ SOLN
INTRAMUSCULAR | Status: DC | PRN
  Administered 2023-03-18: 2 mL

## 2023-03-18 MED ORDER — ASPIRIN 81 MG PO TBEC: 81.0000 mg | DELAYED_RELEASE_TABLET | Freq: Every day | ORAL | Status: DC

## 2023-03-18 MED ORDER — MIDAZOLAM HCL 2 MG/2ML IJ SOLN
INTRAMUSCULAR | Status: AC
  Filled 2023-03-18: qty 2

## 2023-03-18 MED ORDER — SODIUM CHLORIDE 0.9 % IV SOLN: 250.0000 mL | INTRAVENOUS | Status: DC | PRN

## 2023-03-18 MED ORDER — MIDAZOLAM HCL 2 MG/2ML IJ SOLN
INTRAMUSCULAR | Status: DC | PRN
  Administered 2023-03-18: 2 mg via INTRAVENOUS

## 2023-03-18 SURGICAL SUPPLY — 12 items
CATH INFINITI 5 FR LCB (CATHETERS) IMPLANT
CATH INFINITI 5FR MULTPACK ANG (CATHETERS) IMPLANT
DEVICE RAD COMP TR BAND LRG (VASCULAR PRODUCTS) IMPLANT
ELECT DEFIB PAD ADLT CADENCE (PAD) IMPLANT
GLIDESHEATH SLEND SS 6F .021 (SHEATH) IMPLANT
GUIDEWIRE INQWIRE 1.5J.035X260 (WIRE) IMPLANT
INQWIRE 1.5J .035X260CM (WIRE) ×1
KIT HEART LEFT (KITS) ×1 IMPLANT
PACK CARDIAC CATHETERIZATION (CUSTOM PROCEDURE TRAY) ×1 IMPLANT
SHEATH PROBE COVER 6X72 (BAG) IMPLANT
TRANSDUCER W/STOPCOCK (MISCELLANEOUS) ×1 IMPLANT
TUBING CIL FLEX 10 FLL-RA (TUBING) ×1 IMPLANT

## 2023-03-18 NOTE — H&P (View-Only) (Signed)
Cardiology Consultation   Patient ID: Ronald Petersen MRN: [id number]; DOB: May 14, 1956  Admit date: 03/17/2023 Date of Consult: 03/18/2023  PCP:  Lowne Chase, [facility] Cardiologist:  Berniece Salines, DO  Sleep Medicine:  Shelva Majestic, MD       Patient Profile:   Ronald Petersen is a 67 y.o. 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 Cyndia Skeeters.  History of Present Illness:   Ronald Petersen carries the above hx followed by Dr. Harriet Masson. CAD was initially diagnosed in 2021 after cor CT prompted cath then PCI to LAD and LCx. 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 Saunders Revel felt reflected poor clopidogrel metabolism. It was recommended that while on DAPT the additional antiplatelet be either ticagrelor or prasugrel. He continued to have chest pain at many follow-up visits. He was trialed on Imdur but he stopped this due to headaches and fatigue. He was also treated with Ranexa. Repeat cath 11/2020 showed patent stents and otherise nonobstructive disease but f/u cath in 2022 showed severe bifurcation disease involving distal LM with at least 40% stenosis going into ostial LCx with a culprit lesion being ostial LAD 95% stenosis. He subsequently underwent CABGx2 11/2021. Echo at that time had shown EF 60-65%, moderate asymmetric left ventricular hypertrophy of the basal-septal segment,  mildly elevated PASP, mild LAE, trivial MR, mild AS, dilated IVC. Post-op course notable for pleural effusion requiring thoracentesis with cytology negative for malignancy. He also has h/o SOB and LE edema treated with Lasix but has been unable to take this on a scheduled basis. He was referred to pulmonary and felt to have mixed restrictive and obstructive lung disease. Prior monitors for dizziness/palpitations were felt to be normal in 09/2020, 01/2021 and 05/2022 (last monitor with HR range 45-102, average 60bpm). He was previously referred by Dr. Claiborne Billings for oral appliance for his sleep apnea but they were unable to reach him to schedule this. Regarding antiplatelet, he is on ASA 325mg  daily. He has also seen primary care pharmacist with h/o self-discontinuation of statin and metformin therapy. He was started on ezetimibe.  Per office notes, at visit in 04/2022, Ranexa was restarted then increased at [place] 07/2022 because of continued chest discomfort. Notes also outline patient previously switched from metoprolol to carvedilol as he reported HR dropping at home, then marked not taking carvedilol in 05/2022. However today the patient does not recall going back on Ranexa this past year, stating he hasn't taken this since before his bypass, nor does he recall what happened with the beta blocker. His wife then questions him if he is taking himself off medicines and this causes some discord. He has continued to have episodic chest pain ever since that visit with very vague description, unable to quantify how frequently this happens. He reports he has good days and bad days. Can go 4 bad days then feel the best he's felt  in a long time. He also feels intermittently short of breath but without specific pattern to the bad days.  The last 3 days he's felt generally poorly with malaise and noticed his BP running up to 160/90s at times. He also began to develop worsening of his baseline chest pain, sometimes when doing nothing  at all, sometimes when exerting himself, but not every time. He states he has chronic neck issues so can be hard to discern between the two. He did have some SOB and radiation down left arm with this. He also reports he's been dealing with nausea for months. No syncope or palpitations. Longest consecutive episode was about 30-40 minutes but episodes had waxed and waned for most of the last 3 days. Received SL NTG in ER with some relief. Currently feeling OK. Labs show negative troponin x 2, BNP wnl at 45.2, NA 133, Cr 0.90, resp panel negative, CXR with no acute disease but chronic left costophrenic angle blunting likely from chronic pleural thickening/scarring.  NSR 66bpm, probable LAE, nonspecific TW change in lead III - this is really the only STTW change from prior, but not specific for ischemia.   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 inferring suboptimal Plavix platelet inhibition,   Right rotator cuff tear 11/23/2018   Sleep apnea    no CPAP    Past Surgical History:  Procedure Laterality Date   APPENDECTOMY     ARTHOSCOPIC ROTAOR CUFF REPAIR Right 11/23/2018   Procedure: ARTHROSCOPIC ROTATOR CUFF REPAIR;  Surgeon: [name], MD;  Location: [place];  Service: Orthopedics;  Laterality: Right;   CHOLECYSTECTOMY     CORONARY ARTERY BYPASS GRAFT N/A 12/24/2021   x2 LIMA to LAD; SVG to Obtuse Marginal   CORONARY STENT INTERVENTION N/A 11/19/2020   Procedure: CORONARY STENT INTERVENTION;  Surgeon: Nelva Bush, MD;   Location: [facility];  Service: Cardiovascular;  Laterality: N/A;   ENDOVEIN HARVEST OF GREATER SAPHENOUS VEIN Right 12/24/2021   Procedure: ENDOVEIN HARVEST OF GREATER SAPHENOUS VEIN;  Surgeon: Lajuana Matte, MD;  Location: [place];  Service: Open Heart Surgery;  Laterality: Right;   INTRAVASCULAR ULTRASOUND/IVUS N/A 11/19/2020   Procedure: Intravascular Ultrasound/IVUS;  Surgeon: Nelva Bush, MD;  Location: [facility];  Service: Cardiovascular;  Laterality: N/A;   IR THORACENTESIS ASP PLEURAL SPACE W/IMG GUIDE  01/31/2022   LEFT HEART CATH AND CORONARY ANGIOGRAPHY N/A 11/19/2020   Procedure: LEFT HEART CATH AND CORONARY ANGIOGRAPHY;  Surgeon: Nelva Bush, MD;  Location: [facility];  Service: Cardiovascular;  Laterality: N/A;   LEFT HEART CATH AND CORONARY ANGIOGRAPHY N/A 12/24/2020   Procedure: LEFT HEART CATH AND CORONARY ANGIOGRAPHY;  Surgeon: Martinique, Peter M, MD;  Location: [facility];  Service: Cardiovascular;  Laterality: N/A;   LEFT HEART CATH AND CORONARY ANGIOGRAPHY N/A 12/16/2021   Procedure: LEFT HEART CATH AND CORONARY ANGIOGRAPHY;  Surgeon: Leonie Man, MD;  Location: [facility];  Service: Cardiovascular;  Laterality: N/A;   LUMBAR LAMINECTOMY     SHOULDER ARTHROSCOPY WITH ROTATOR CUFF REPAIR AND SUBACROMIAL DECOMPRESSION Right 11/23/2018   Procedure: SHOULDER ARTHROSCOPY WITH ROTATOR CUFF REPAIR AND SUBACROMIAL DECOMPRESSION;  Surgeon: [name], MD;  Location: [place];  Service: Orthopedics;  Laterality: Right;   TEE WITHOUT CARDIOVERSION N/A 12/24/2021   Procedure: TRANSESOPHAGEAL ECHOCARDIOGRAM (TEE);  Surgeon: Lajuana Matte, MD;  Location: [place];  Service: Open Heart Surgery;  Laterality: N/A;     Home Medications:  Prior to Admission medications   Medication Sig Start Date End Date Taking? Authorizing Provider  acetaminophen (TYLENOL) 650 MG CR tablet Take 650 mg by mouth every 8 (eight)  hours as needed for pain.    [provider]  aspirin EC 325 MG EC tablet Take 1 tablet (325 mg total) by mouth daily. 12/28/21   Gold, Wayne E, PA-C  ELDERBERRY PO Take 300 mg by mouth daily.    [provider]  empagliflozin (JARDIANCE) 10 MG TABS tablet Take 1 tablet (10 mg total) by mouth daily. 02/23/23   [name], DO  ezetimibe (ZETIA) 10 MG tablet Take 1 tablet (10 mg total) by mouth daily. For cholesterol 02/18/23   [name], Yvonne R, DO  famotidine (PEPCID) 20 MG tablet Take 1 tablet (20 mg total) by mouth daily. 12/16/22   Roma Schanz R, DO  fenofibrate 160 MG tablet Take 1 tablet (160 mg total) by mouth daily. 02/23/23   [name], DO  gabapentin (NEURONTIN) 800 MG tablet Take 1 tablet (800 mg total) by mouth 3 (three) times daily. 08/20/22   Hyatt, Max T, DPM  glucose blood (ONETOUCH VERIO) test strip USE AS INSTRUCTED TO CHECK BLOOD SUGAR ONCE A DAY 01/23/22 04/24/23  Shamleffer, Melanie Crazier, MD  Magnesium Citrate 200 MG TABS Take 400 mg by mouth daily.    [provider]  ondansetron (ZOFRAN) 4 MG tablet Take 1 tablet (4 mg total) by mouth every 8 (eight) hours as needed. 02/23/23 02/23/24  [name], DO  pantoprazole (PROTONIX) 40 MG tablet Take 1 tablet (40 mg total) by mouth 2 (two) times daily. 02/17/23   [name], DO  sertraline (ZOLOFT) 100 MG tablet Take 1.5 tablets (150 mg total) by mouth daily. 02/20/23   [name], DO  tiZANidine (ZANAFLEX) 4 MG tablet Take 1 tablet (4 mg total) by mouth every 6 (six) hours as needed for muscle spasms. 02/23/23   [name], DO  traZODone (DESYREL) 50 MG tablet Take 1 tablet (50 mg total) by mouth at bedtime as needed for sleep. 02/17/23   [name], Yvonne R, DO  umeclidinium-vilanterol (ANORO ELLIPTA) 62.5-25 MCG/ACT AEPB Inhale 1 puff into the lungs daily. 02/17/23 02/12/24  Garner Nash, DO  vitamin E 180 MG (400 UNITS) capsule Take  1,600 Units by mouth daily.    [provider]    Inpatient Medications: Scheduled Meds:  aspirin EC  325 mg Oral Daily   enoxaparin (LOVENOX) injection  40 mg Subcutaneous Q24H   gabapentin  400 mg Oral TID   insulin aspart  0-5 Units Subcutaneous QHS   insulin aspart  0-6 Units Subcutaneous TID WC   metoprolol tartrate  12.5 mg Oral BID   pantoprazole  40 mg Oral BID   sertraline  150 mg Oral Daily   umeclidinium-vilanterol  1 puff Inhalation  Daily   Continuous Infusions:  PRN Meds: acetaminophen, ondansetron (ZOFRAN) IV, oxyCODONE, tiZANidine, traZODone  Allergies:    Allergies  Allergen Reactions   Niacin Anaphylaxis    Flushing - required ED visit    Social History:   Social History   Socioeconomic History   Marital status: Married    Spouse name: Lorriane Shire   Number of children: 2   Years of education: Not on file   Highest education level: Not on file  Occupational History   Occupation: self employed    Fish farm manager: UNEMPLOYED  Tobacco Use   Smoking status: Former    Years: 16    Types: Cigarettes    Quit date: 12/29/1985    Years since quitting: 37.2   Smokeless tobacco: Never  Vaping Use   Vaping Use: Never used  Substance and Sexual Activity   Alcohol use: Not Currently   Drug use: No   Sexual activity: Yes    Partners: Female  Other Topics Concern   Not on file  Social History Narrative   Exercise-- 3 days   Pt has hs degree   Right handed   One story home   Drinks caffeine 2 cups in am   Are you right handed or left handed?    Are you currently employed ?    What is your current occupation? retired   Do you live at home alone?   Who lives with you?  wife   What type of home do you live in: 1 story or 2 story?        Social Determinants of Health   Financial Resource Strain: Medium Risk (01/19/2023)   Overall Financial Resource Strain (CARDIA)    Difficulty of Paying Living Expenses: Somewhat hard  Food Insecurity: Food Insecurity  Present (03/17/2023)   Hunger Vital Sign    Worried About Running Out of Food in the Last Year: Never true    Ran Out of Food in the Last Year: Often true  Transportation Needs: No Transportation Needs (03/17/2023)   PRAPARE - Hydrologist (Medical): No    Lack of Transportation (Non-Medical): No  Physical Activity: Sufficiently Active (02/04/2022)   Exercise Vital Sign    Days of Exercise per Week: 7 days    Minutes of Exercise per Session: 30 min  Stress: Stress Concern Present (01/19/2023)   [place]    Feeling of Stress : To some extent  Social Connections: Moderately Integrated (01/27/2023)   Social Connection and Isolation Panel [NHANES]    Frequency of Communication with Friends and Family: More than three times a week    Frequency of Social Gatherings with Friends and Family: More than three times a week    Attends Religious Services: More than 4 times per year    Active Member of [employer] or Organizations: No    Attends Archivist Meetings: Not on file    Marital Status: Married  Human resources officer Violence: Not At Risk (03/17/2023)   Humiliation, Afraid, Rape, and Kick questionnaire    Fear of Current or Ex-Partner: No    Emotionally Abused: No    Physically Abused: No    Sexually Abused: No    Family History:    Family History  Problem Relation Age of Onset   COPD Mother    Stroke Father    Ovarian cancer Sister    Stomach cancer Sister    Heart disease Sister  MI   Heart disease Sister        MI   Stomach cancer Maternal Grandmother    Alcohol abuse Other    Depression Other    Arthritis Other    Hypertension Other    Coronary artery disease Other    Ovarian cancer Other        neice   Ovarian cancer Other        neice   Colon polyps Neg Hx    Colon cancer Neg Hx      ROS:  Please see the history of present illness.  All other ROS reviewed and  negative.     Physical Exam/Data:   Vitals:   03/17/23 2211 03/18/23 0218 03/18/23 0638 03/18/23 0803  BP: (!) 178/87 116/72 (!) 91/59 (!) 104/54  Pulse: 72 60 60 60  Resp: 18 18 18 18   Temp: 97.6 F (36.4 C) 97.8 F (36.6 C) 97.8 F (36.6 C) 97.7 F (36.5 C)  TempSrc: Oral Oral Oral Oral  SpO2: 95% 94% 94% 93%  Weight: 89.3 kg     Height: 5\' 11"  (1.803 m)       Intake/Output Summary (Last 24 hours) at 03/18/2023 1154 Last data filed at 03/18/2023 0042 Gross per 24 hour  Intake --  Output 340 ml  Net -340 ml      03/17/2023   10:11 PM 03/17/2023    1:58 PM 03/12/2023   11:02 AM  Last 3 Weights  Weight (lbs) 196 lb 13.9 oz 200 lb 204 lb  Weight (kg) 89.3 kg 90.719 kg 92.534 kg     Body mass index is 27.46 kg/m.  General: Well developed, well nourished, in no acute distress. Head: Normocephalic, atraumatic, sclera non-icteric, no xanthomas, nares are without discharge. Neck: Negative for carotid bruits. JVP not elevated. Lungs: Clear bilaterally to auscultation without wheezes, rales, or rhonchi. Breathing is unlabored. Heart: RRR S1 S2 minimal SEM without rubs or gallops.  Abdomen: Soft, non-tender, non-distended with normoactive bowel sounds. No rebound/guarding. Extremities: No clubbing or cyanosis. No edema. Distal pedal pulses are 2+ and equal bilaterally. Neuro: Alert and oriented X 3. Moves all extremities spontaneously. Psych:  Responds to questions appropriately with a normal affect.   EKG:  The EKG was personally reviewed and demonstrates:  NSR 66bpm, probable LAE, nonspecific TW change in lead III Telemetry:  Telemetry was personally reviewed and demonstrates:  NSR  Relevant CV Studies: 2d echo 11/2021   1. Left ventricular ejection fraction, by estimation, is 60 to 65%. The  left ventricle has normal function. The left ventricle has no regional  wall motion abnormalities. There is moderate asymmetric left ventricular  hypertrophy of the basal-septal   segment. Left ventricular diastolic parameters are indeterminate.   2. Right ventricular systolic function is normal. The right ventricular  size is normal. There is mildly elevated pulmonary artery systolic  pressure. The estimated right ventricular systolic pressure is [id number] mmHg.   3. Left atrial size was mildly dilated.   4. The mitral valve is normal in structure. Trivial mitral valve  regurgitation. No evidence of mitral stenosis.   5. The aortic valve is tricuspid. Aortic valve regurgitation is trivial.  Mild aortic valve stenosis. Vmax 2.2 m/s, MG 43mmHg, AVA 2.0 cm^2, DI 0.54   6. The inferior vena cava is dilated in size with <50% respiratory  variability, suggesting right atrial pressure of 15 mmHg.   Laboratory Data:  High Sensitivity Troponin:   Recent Labs  Lab 03/17/23 1451  03/17/23 1648  TROPONINIHS 3 3     Chemistry Recent Labs  Lab 03/17/23 1451  NA 133*  K 3.7  CL 101  CO2 24  GLUCOSE 140*  BUN 27*  CREATININE 0.90  CALCIUM 9.0  GFRNONAA >60  ANIONGAP 8    No results for input(s): "PROT", "ALBUMIN", "AST", "ALT", "ALKPHOS", "BILITOT" in the last 168 hours. Lipids No results for input(s): "CHOL", "TRIG", "HDL", "LABVLDL", "LDLCALC", "CHOLHDL" in the last 168 hours.  Hematology Recent Labs  Lab 03/17/23 1451  WBC 8.8  RBC 5.94*  HGB 16.6  HCT 48.5  MCV 81.6  MCH 27.9  MCHC 34.2  RDW 12.8  PLT 180   Thyroid No results for input(s): "TSH", "FREET4" in the last 168 hours.  BNP Recent Labs  Lab 03/17/23 1500  BNP 45.2    DDimer No results for input(s): "DDIMER" in the last 168 hours.   Radiology/Studies:  DG Chest 2 View  Result Date: 03/17/2023 CLINICAL DATA:  Left-sided chest pain for 3 days. Some shortness of breath. EXAM: CHEST - 2 VIEW COMPARISON:  Chest radiographs 09/10/2022 in 05/14/2022; CT chest and chest radiographs 01/24/2022 FINDINGS: Status post median sternotomy. Cardiac silhouette and mediastinal contours are unchanged and  within normal limits. Mild-to-moderate calcification within the aortic arch. Left costophrenic angle blunting likely from a combination of the pericardial fat pad and chronic pleural thickening/scarring, unchanged from 09/10/2022 and 05/14/2022. Minimal bilateral lower lung interstitial thickening is unchanged from multiple prior radiographs and chronic. No acute lung opacity. No pleural effusion or pneumothorax. Mild-to-moderate multilevel degenerative disc changes of the thoracic spine. IMPRESSION: 1. No active cardiopulmonary disease. 2. 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 Electronically Signed   By: Yvonne Kendall M.D.   On: 03/17/2023 14:40     Assessment and Plan:   1. Chest pain with mixed features, known CAD s/p prior PCI 2021, CABG 2022 - no evidence of acute ischemia by EKG or troponins - history somewhat difficult to sort out given chronicity of symptoms over the last few years though he does report he had felt better after CABG - continue ASA (will defer dosing to primary cardiologist who has maintained him on 325mg  dosing) - resume ezetimibe at DC unless held for other medical reasons - hold metoprolol as this was previously stopped due to pt concerns for low HR at home - will discuss further eval with MD   2. Hyponatremia - etiology unclear, further per primary team - if persists may need chest imaging to see if any unifying dx for CP/hyponatremia  3. Essential HTN - labile, peak value 178/87 here, nadir 91/59, currently 123/89 - add on TSH to labs - manage in context above  4. Hyperlipidemia - per notes, has been followed by PCP, saw pharmD there 01/2023 as patient had previously stopped pravastatin and rosuvastatin and was started on Zetia - continue Zetia and continue f/u PCP  5. Mild AS by echo 11/2021 - very mild murmur on exam - consider OP f/u  Also has f/u with Dr. Harriet Masson in April  Risk Assessment/Risk Scores:                 For questions or updates, please contact [place] Please consult [email or web address] for contact info under    Signed, Charlie Pitter, PA-C  03/18/2023 11:54 AM  Personally seen and examined. Agree with above.  67 year old male status post CABG in 2012 LIMA to LAD SVG to OM with suboptimal  Plavix platelet inhibition here with chest discomfort.  Been having difficulties for quite some time off and on in clinic.  We will go ahead and proceed with left heart catheterization.  Risks and benefits of been explained including stroke heart attack death renal impairment bleeding.  He is willing to proceed.  Discussed this with his wife as well.  Creatinine 0.9, hemoglobin 16.6  Candee Furbish, MD

## 2023-03-18 NOTE — Interval H&P Note (Signed)
Cath Lab Visit (complete for each Cath Lab visit)  Clinical Evaluation Leading to the Procedure:   ACS: Yes.    Non-ACS:    Anginal Classification: CCS IV  Anti-ischemic medical therapy: Minimal Therapy (1 class of medications)  Non-Invasive Test Results: No non-invasive testing performed  Prior CABG: Previous CABG      History and Physical Interval Note:  03/18/2023 2:09 PM  Ronald Petersen  has presented today for surgery, with the diagnosis of chest pain.  The various methods of treatment have been discussed with the patient and family. After consideration of risks, benefits and other options for treatment, the patient has consented to  Procedure(s): LEFT HEART CATH AND CORS/GRAFTS ANGIOGRAPHY (N/A) as a surgical intervention.  The patient's history has been reviewed, patient examined, no change in status, stable for surgery.  I have reviewed the patient's chart and labs.  Questions were answered to the patient's satisfaction.     Ronald Petersen

## 2023-03-18 NOTE — Care Management CC44 (Signed)
Condition Code 44 Documentation Completed  Patient Details  Name: Ronald Petersen MRN: [id number] Date of Birth: 1956/06/06   Condition Code 44 given:  Yes Patient signature on Condition Code 44 notice:  Yes Documentation of 2 MD's agreement:  Yes Code 44 added to claim:  Yes    Bethena Roys, RN 03/18/2023, 4:23 PM

## 2023-03-18 NOTE — Progress Notes (Signed)
PROGRESS NOTE  Ronald Petersen [id number] DOB: 1956/09/23   PCP: [name], DO  Patient is from: Home.  Lives with his wife.  Independently ambulates at baseline.  DOA: 03/17/2023 LOS: 1  Chief complaints Chief Complaint  Patient presents with   Chest Pain     Brief Narrative / Interim history: 67 year old M with PMH of CAD/CABG in 11/2021, DM-2, COPD, OSA not on CPAP, chronic pain, HTN and HLD presenting with typical chest pain for 3 days with associated dyspnea, diaphoresis and elevated blood pressure, and admitted for chest pain evaluation.  In ED, stable vitals except for slightly elevated BP.  EKG features sinus rhythm with RAD.  Basic labs, serial troponin, BNP and chest x-ray without significant finding.  Cardiology consulted.   Subjective: Seen and examined earlier this morning.  No major events overnight of this morning.  Reports an episode of abdominal pain across upper abdomen last night.  Not able to characterize the pain.  He is currently pain-free.  Also denies shortness of breath, nausea, vomiting or UTI symptoms.  Denies reflux or heartburn.  Objective: Vitals:   03/18/23 0218 03/18/23 0638 03/18/23 0803 03/18/23 1221  BP: 116/72 (!) 91/59 (!) 104/54 123/89  Pulse: 60 60 60 (!) 56  Resp: 18 18 18 16   Temp: 97.8 F (36.6 C) 97.8 F (36.6 C) 97.7 F (36.5 C) (!) 97.4 F (36.3 C)  TempSrc: Oral Oral Oral Oral  SpO2: 94% 94% 93% 92%  Weight:      Height:        Examination:  GENERAL: No apparent distress.  Nontoxic. HEENT: MMM.  Vision and hearing grossly intact.  NECK: Supple.  No apparent JVD.  RESP:  No IWOB.  Fair aeration bilaterally. CVS:  RRR. Heart sounds normal.  ABD/GI/GU: BS+. Abd soft, NTND.  MSK/EXT:  Moves extremities. No apparent deformity. No edema.  SKIN: no apparent skin lesion or wound NEURO: Awake, alert and oriented appropriately.  No apparent focal neuro deficit. PSYCH: Calm. Normal affect.   Procedures:   None  Microbiology summarized: [id number], influenza and RSV PCR nonreactive  Assessment and plan: Principal Problem:   Unstable angina (HCC) Active Problems:   OSA (obstructive sleep apnea)   Hyperlipidemia associated with type 2 diabetes mellitus ([place])   Hypertension   Type 2 diabetes mellitus with diabetic polyneuropathy, without long-term current use of insulin (HCC)   Coronary artery disease involving native coronary artery of native heart with unstable angina pectoris ([place])  Chest pain/history of CAD/CABG: Initial description of chest pain typical and concerning.  CABG in 12/22.  Significant risk factors.  Currently chest pain-free.  EKG, serial troponin, BNP, CXR and basic labs without significant finding.  Reportedly off metoprolol.  A1c 6.5% in 12/23.  LDL 108.  He is on Zetia and fenofibrate.  Not sure why he is not on a statin. -Hold metoprolol due to hypotension. -Check hemoglobin A1c  -Continue Zetia, fenofibrate -Decrease aspirin to low-dose -Cardiology to see patient  Controlled NIDDM-2 with hyperglycemia: A1c 6.5% in 11/2022.  Seems to be on Jardiance at home. Recent Labs  Lab 03/17/23 2239 03/18/23 0757 03/18/23 1219  GLUCAP 174* 138* 132*  -Recheck hemoglobin A1c  Essential hypertension: Slightly hypotensive earlier this morning. -Discontinued metoprolol which was started this admission.  Chronic COPD: Stable. -Continue home inhalers.  Abdominal pain: Reports pain across his upper abdomen last night.  He is currently pain-free.  Abdominal exam benign. -Continue home Protonix  Chronic pain -Continue home medications.  History of OSA not on CPAP  Secondhand smoker: Wife smokes about a pack a day.  Body mass index is 27.46 kg/m.          DVT prophylaxis:  enoxaparin (LOVENOX) injection 40 mg Start: 03/18/23 0000  Code Status: Full code Family Communication: Updated patient's wife at bedside Level of care: Telemetry Cardiac Status is:  Inpatient Remains inpatient appropriate because: Chest pain evaluation   Final disposition: Home once medically stable Consultants:  Cardiology  35 minutes with more than 50% spent in reviewing records, counseling patient/family and coordinating care.   Sch Meds:  Scheduled Meds:  [START ON 03/19/2023] aspirin EC  81 mg Oral Daily   enoxaparin (LOVENOX) injection  40 mg Subcutaneous Q24H   ezetimibe  10 mg Oral Daily   fenofibrate  160 mg Oral Daily   gabapentin  400 mg Oral TID   insulin aspart  0-5 Units Subcutaneous QHS   insulin aspart  0-6 Units Subcutaneous TID WC   pantoprazole  40 mg Oral BID   sertraline  150 mg Oral Daily   umeclidinium-vilanterol  1 puff Inhalation Daily   Continuous Infusions: PRN Meds:.acetaminophen, ondansetron (ZOFRAN) IV, oxyCODONE, tiZANidine, traZODone  Antimicrobials: Anti-infectives (From admission, onward)    None        I have personally reviewed the following labs and images: CBC: Recent Labs  Lab 03/17/23 1451  WBC 8.8  HGB 16.6  HCT 48.5  MCV 81.6  PLT 180   BMP &GFR Recent Labs  Lab 03/17/23 1451  NA 133*  K 3.7  CL 101  CO2 24  GLUCOSE 140*  BUN 27*  CREATININE 0.90  CALCIUM 9.0   Estimated Creatinine Clearance: 84.8 mL/min (by C-G formula based on SCr of 0.9 mg/dL). Liver & Pancreas: No results for input(s): "AST", "ALT", "ALKPHOS", "BILITOT", "PROT", "ALBUMIN" in the last 168 hours. No results for input(s): "LIPASE", "AMYLASE" in the last 168 hours. No results for input(s): "AMMONIA" in the last 168 hours. Diabetic: No results for input(s): "HGBA1C" in the last 72 hours. Recent Labs  Lab 03/17/23 2239 03/18/23 0757 03/18/23 1219  GLUCAP 174* 138* 132*   Cardiac Enzymes: No results for input(s): "CKTOTAL", "CKMB", "CKMBINDEX", "TROPONINI" in the last 168 hours. No results for input(s): "PROBNP" in the last 8760 hours. Coagulation Profile: No results for input(s): "INR", "PROTIME" in the last 168  hours. Thyroid Function Tests: No results for input(s): "TSH", "T4TOTAL", "FREET4", "T3FREE", "THYROIDAB" in the last 72 hours. Lipid Profile: No results for input(s): "CHOL", "HDL", "LDLCALC", "TRIG", "CHOLHDL", "LDLDIRECT" in the last 72 hours. Anemia Panel: No results for input(s): "VITAMINB12", "FOLATE", "FERRITIN", "TIBC", "IRON", "RETICCTPCT" in the last 72 hours. Urine analysis:    Component Value Date/Time   COLORURINE YELLOW 12/23/2021 1120   APPEARANCEUR CLEAR 12/23/2021 1120   LABSPEC 1.015 12/23/2021 1120   PHURINE 7.5 12/23/2021 1120   GLUCOSEU 250 (A) 12/23/2021 1120   HGBUR NEGATIVE 12/23/2021 1120   HGBUR small 11/05/2010 0941   BILIRUBINUR NEGATIVE 12/23/2021 1120   BILIRUBINUR 1+ 06/07/2021 1403   KETONESUR NEGATIVE 12/23/2021 1120   PROTEINUR NEGATIVE 12/23/2021 1120   UROBILINOGEN 2.0 (A) 06/07/2021 1403   UROBILINOGEN negative 11/05/2010 0941   NITRITE NEGATIVE 12/23/2021 1120   LEUKOCYTESUR NEGATIVE 12/23/2021 1120   Sepsis Labs: Invalid input(s): "PROCALCITONIN", "LACTICIDVEN"  Microbiology: Recent Results (from the past 240 hour(s))  Resp panel by RT-PCR (RSV, Flu A&B, Covid) Anterior Nasal Swab     Status: None   Collection Time: 03/17/23  2:17 PM   Specimen: Anterior Nasal Swab  Result Value Ref Range Status   SARS Coronavirus 2 by RT PCR NEGATIVE NEGATIVE Final    Comment: (NOTE) SARS-CoV-2 target nucleic acids are NOT DETECTED.  The SARS-CoV-2 RNA is generally detectable in upper respiratory specimens during the acute phase of infection. The lowest concentration of SARS-CoV-2 viral copies this assay can detect is 138 copies/mL. A negative result does not preclude SARS-Cov-2 infection and should not be used as the sole basis for treatment or other patient management decisions. A negative result may occur with  improper specimen collection/handling, submission of specimen other than nasopharyngeal swab, presence of viral mutation(s) within  the areas targeted by this assay, and inadequate number of viral copies(<138 copies/mL). A negative result must be combined with clinical observations, patient history, and epidemiological information. The expected result is Negative.  Fact Sheet for Patients:  EntrepreneurPulse.com.au  Fact Sheet for Healthcare Providers:  IncredibleEmployment.be  This test is no t yet approved or cleared by the [place] FDA and  has been authorized for detection and/or diagnosis of SARS-CoV-2 by FDA under an Emergency Use Authorization (EUA). This EUA will remain  in effect (meaning this test can be used) for the duration of the COVID-19 declaration under Section 564(b)(1) of the Act, 21 U.S.C.section 360bbb-3(b)(1), unless the authorization is terminated  or revoked sooner.       Influenza A by PCR NEGATIVE NEGATIVE Final   Influenza B by PCR NEGATIVE NEGATIVE Final    Comment: (NOTE) The Xpert Xpress SARS-CoV-2/FLU/RSV plus assay is intended as an aid in the diagnosis of influenza from Nasopharyngeal swab specimens and should not be used as a sole basis for treatment. Nasal washings and aspirates are unacceptable for Xpert Xpress SARS-CoV-2/FLU/RSV testing.  Fact Sheet for Patients: EntrepreneurPulse.com.au  Fact Sheet for Healthcare Providers: IncredibleEmployment.be  This test is not yet approved or cleared by the [place] FDA and has been authorized for detection and/or diagnosis of SARS-CoV-2 by FDA under an Emergency Use Authorization (EUA). This EUA will remain in effect (meaning this test can be used) for the duration of the COVID-19 declaration under Section 564(b)(1) of the Act, 21 U.S.C. section 360bbb-3(b)(1), unless the authorization is terminated or revoked.     Resp Syncytial Virus by PCR NEGATIVE NEGATIVE Final    Comment: (NOTE) Fact Sheet for  Patients: EntrepreneurPulse.com.au  Fact Sheet for Healthcare Providers: IncredibleEmployment.be  This test is not yet approved or cleared by the [place] FDA and has been authorized for detection and/or diagnosis of SARS-CoV-2 by FDA under an Emergency Use Authorization (EUA). This EUA will remain in effect (meaning this test can be used) for the duration of the COVID-19 declaration under Section 564(b)(1) of the Act, 21 U.S.C. section 360bbb-3(b)(1), unless the authorization is terminated or revoked.  Performed at [facility], [address], [place], [place] [address]     Radiology Studies: DG Chest 2 View  Result Date: 03/17/2023 CLINICAL DATA:  Left-sided chest pain for 3 days. Some shortness of breath. EXAM: CHEST - 2 VIEW COMPARISON:  Chest radiographs 09/10/2022 in 05/14/2022; CT chest and chest radiographs 01/24/2022 FINDINGS: Status post median sternotomy. Cardiac silhouette and mediastinal contours are unchanged and within normal limits. Mild-to-moderate calcification within the aortic arch. Left costophrenic angle blunting likely from a combination of the pericardial fat pad and chronic pleural thickening/scarring, unchanged from 09/10/2022 and 05/14/2022. Minimal bilateral lower lung interstitial thickening is unchanged from multiple prior radiographs and chronic.  No acute lung opacity. No pleural effusion or pneumothorax. Mild-to-moderate multilevel degenerative disc changes of the thoracic spine. IMPRESSION: 1. No active cardiopulmonary disease. 2. 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 Electronically Signed   By: Yvonne Kendall M.D.   On: 03/17/2023 14:40      Deanne Bedgood T. [place]  If 7PM-7AM, please contact night-coverage [email or web address] 03/18/2023, 12:52 PM

## 2023-03-18 NOTE — Consult Note (Addendum)
Cardiology Consultation   Patient ID: Ronald Petersen MRN: [id number]; DOB: 1956/06/05  Admit date: 03/17/2023 Date of Consult: 03/18/2023  PCP:  Lowne Chase, [facility] Cardiologist:  Berniece Salines, DO  Sleep Medicine:  Shelva Majestic, MD       Patient Profile:   Ronald Petersen is a 67 y.o. 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 Cyndia Skeeters.  History of Present Illness:   Ronald Petersen carries the above hx followed by Dr. Harriet Masson. CAD was initially diagnosed in 2021 after cor CT prompted cath then PCI to LAD and LCx. 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 Saunders Revel felt reflected poor clopidogrel metabolism. It was recommended that while on DAPT the additional antiplatelet be either ticagrelor or prasugrel. He continued to have chest pain at many follow-up visits. He was trialed on Imdur but he stopped this due to headaches and fatigue. He was also treated with Ranexa. Repeat cath 11/2020 showed patent stents and otherise nonobstructive disease but f/u cath in 2022 showed severe bifurcation disease involving distal LM with at least 40% stenosis going into ostial LCx with a culprit lesion being ostial LAD 95% stenosis. He subsequently underwent CABGx2 11/2021. Echo at that time had shown EF 60-65%, moderate asymmetric left ventricular hypertrophy of the basal-septal segment,  mildly elevated PASP, mild LAE, trivial MR, mild AS, dilated IVC. Post-op course notable for pleural effusion requiring thoracentesis with cytology negative for malignancy. He also has h/o SOB and LE edema treated with Lasix but has been unable to take this on a scheduled basis. He was referred to pulmonary and felt to have mixed restrictive and obstructive lung disease. Prior monitors for dizziness/palpitations were felt to be normal in 09/2020, 01/2021 and 05/2022 (last monitor with HR range 45-102, average 60bpm). He was previously referred by Dr. Claiborne Billings for oral appliance for his sleep apnea but they were unable to reach him to schedule this. Regarding antiplatelet, he is on ASA 325mg  daily. He has also seen primary care pharmacist with h/o self-discontinuation of statin and metformin therapy. He was started on ezetimibe.  Per office notes, at visit in 04/2022, Ranexa was restarted then increased at [facility] 07/2022 because of continued chest discomfort. Notes also outline patient previously switched from metoprolol to carvedilol as he reported HR dropping at home, then marked not taking carvedilol in 05/2022. However today the patient does not recall going back on Ranexa this past year, stating he hasn't taken this since before his bypass, nor does he recall what happened with the beta blocker. His wife then questions him if he is taking himself off medicines and this causes some discord. He has continued to have episodic chest pain ever since that visit with very vague description, unable to quantify how frequently this happens. He reports he has good days and bad days. Can go 4 bad days then feel the best he's felt  in a long time. He also feels intermittently short of breath but without specific pattern to the bad days.  The last 3 days he's felt generally poorly with malaise and noticed his BP running up to 160/90s at times. He also began to develop worsening of his baseline chest pain, sometimes when doing nothing  at all, sometimes when exerting himself, but not every time. He states he has chronic neck issues so can be hard to discern between the two. He did have some SOB and radiation down left arm with this. He also reports he's been dealing with nausea for months. No syncope or palpitations. Longest consecutive episode was about 30-40 minutes but episodes had waxed and waned for most of the last 3 days. Received SL NTG in ER with some relief. Currently feeling OK. Labs show negative troponin x 2, BNP wnl at 45.2, NA 133, Cr 0.90, resp panel negative, CXR with no acute disease but chronic left costophrenic angle blunting likely from chronic pleural thickening/scarring.  NSR 66bpm, probable LAE, nonspecific TW change in lead III - this is really the only STTW change from prior, but not specific for ischemia.   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 inferring suboptimal Plavix platelet inhibition,   Right rotator cuff tear 11/23/2018   Sleep apnea    no CPAP    Past Surgical History:  Procedure Laterality Date   APPENDECTOMY     ARTHOSCOPIC ROTAOR CUFF REPAIR Right 11/23/2018   Procedure: ARTHROSCOPIC ROTATOR CUFF REPAIR;  Surgeon: [name], MD;  Location: [facility];  Service: Orthopedics;  Laterality: Right;   CHOLECYSTECTOMY     CORONARY ARTERY BYPASS GRAFT N/A 12/24/2021   x2 LIMA to LAD; SVG to O[place] Marginal   CORONARY STENT INTERVENTION N/A 11/19/2020   Procedure: CORONARY STENT INTERVENTION;  Surgeon: Nelva Bush, MD;   Location: [facility];  Service: Cardiovascular;  Laterality: N/A;   ENDOVEIN HARVEST OF GREATER SAPHENOUS VEIN Right 12/24/2021   Procedure: ENDOVEIN HARVEST OF GREATER SAPHENOUS VEIN;  Surgeon: Lajuana Matte, MD;  Location: [facility];  Service: Open Heart Surgery;  Laterality: Right;   INTRAVASCULAR ULTRASOUND/IVUS N/A 11/19/2020   Procedure: Intravascular Ultrasound/IVUS;  Surgeon: Nelva Bush, MD;  Location: [facility];  Service: Cardiovascular;  Laterality: N/A;   IR THORACENTESIS ASP PLEURAL SPACE W/IMG GUIDE  01/31/2022   LEFT HEART CATH AND CORONARY ANGIOGRAPHY N/A 11/19/2020   Procedure: LEFT HEART CATH AND CORONARY ANGIOGRAPHY;  Surgeon: Nelva Bush, MD;  Location: [facility];  Service: Cardiovascular;  Laterality: N/A;   LEFT HEART CATH AND CORONARY ANGIOGRAPHY N/A 12/24/2020   Procedure: LEFT HEART CATH AND CORONARY ANGIOGRAPHY;  Surgeon: Martinique, Peter M, MD;  Location: [facility];  Service: Cardiovascular;  Laterality: N/A;   LEFT HEART CATH AND CORONARY ANGIOGRAPHY N/A 12/16/2021   Procedure: LEFT HEART CATH AND CORONARY ANGIOGRAPHY;  Surgeon: Leonie Man, MD;  Location: [facility];  Service: Cardiovascular;  Laterality: N/A;   LUMBAR LAMINECTOMY     SHOULDER ARTHROSCOPY WITH ROTATOR CUFF REPAIR AND SUBACROMIAL DECOMPRESSION Right 11/23/2018   Procedure: SHOULDER ARTHROSCOPY WITH ROTATOR CUFF REPAIR AND SUBACROMIAL DECOMPRESSION;  Surgeon: [name], MD;  Location: [facility];  Service: Orthopedics;  Laterality: Right;   TEE WITHOUT CARDIOVERSION N/A 12/24/2021   Procedure: TRANSESOPHAGEAL ECHOCARDIOGRAM (TEE);  Surgeon: Lajuana Matte, MD;  Location: [facility];  Service: Open Heart Surgery;  Laterality: N/A;     Home Medications:  Prior to Admission medications   Medication Sig Start Date End Date Taking? Authorizing Provider  acetaminophen (TYLENOL) 650 MG CR tablet Take 650 mg by mouth every 8 (eight)  hours as needed for pain.    [provider]  aspirin EC 325 MG EC tablet Take 1 tablet (325 mg total) by mouth daily. 12/28/21   Gold, Wayne E, PA-C  ELDERBERRY PO Take 300 mg by mouth daily.    [provider]  empagliflozin (JARDIANCE) 10 MG TABS tablet Take 1 tablet (10 mg total) by mouth daily. 02/23/23   [name], DO  ezetimibe (ZETIA) 10 MG tablet Take 1 tablet (10 mg total) by mouth daily. For cholesterol 02/18/23   [name], Yvonne R, DO  famotidine (PEPCID) 20 MG tablet Take 1 tablet (20 mg total) by mouth daily. 12/16/22   Roma Schanz R, DO  fenofibrate 160 MG tablet Take 1 tablet (160 mg total) by mouth daily. 02/23/23   [name], DO  gabapentin (NEURONTIN) 800 MG tablet Take 1 tablet (800 mg total) by mouth 3 (three) times daily. 08/20/22   Hyatt, Max T, DPM  glucose blood (ONETOUCH VERIO) test strip USE AS INSTRUCTED TO CHECK BLOOD SUGAR ONCE A DAY 01/23/22 04/24/23  Shamleffer, Melanie Crazier, MD  Magnesium Citrate 200 MG TABS Take 400 mg by mouth daily.    [provider]  ondansetron (ZOFRAN) 4 MG tablet Take 1 tablet (4 mg total) by mouth every 8 (eight) hours as needed. 02/23/23 02/23/24  [name], DO  pantoprazole (PROTONIX) 40 MG tablet Take 1 tablet (40 mg total) by mouth 2 (two) times daily. 02/17/23   [name], DO  sertraline (ZOLOFT) 100 MG tablet Take 1.5 tablets (150 mg total) by mouth daily. 02/20/23   [name], DO  tiZANidine (ZANAFLEX) 4 MG tablet Take 1 tablet (4 mg total) by mouth every 6 (six) hours as needed for muscle spasms. 02/23/23   [name], DO  traZODone (DESYREL) 50 MG tablet Take 1 tablet (50 mg total) by mouth at bedtime as needed for sleep. 02/17/23   [name], Yvonne R, DO  umeclidinium-vilanterol (ANORO ELLIPTA) 62.5-25 MCG/ACT AEPB Inhale 1 puff into the lungs daily. 02/17/23 02/12/24  Garner Nash, DO  vitamin E 180 MG (400 UNITS) capsule Take  1,600 Units by mouth daily.    [provider]    Inpatient Medications: Scheduled Meds:  aspirin EC  325 mg Oral Daily   enoxaparin (LOVENOX) injection  40 mg Subcutaneous Q24H   gabapentin  400 mg Oral TID   insulin aspart  0-5 Units Subcutaneous QHS   insulin aspart  0-6 Units Subcutaneous TID WC   metoprolol tartrate  12.5 mg Oral BID   pantoprazole  40 mg Oral BID   sertraline  150 mg Oral Daily   umeclidinium-vilanterol  1 puff Inhalation  Daily   Continuous Infusions:  PRN Meds: acetaminophen, ondansetron (ZOFRAN) IV, oxyCODONE, tiZANidine, traZODone  Allergies:    Allergies  Allergen Reactions   Niacin Anaphylaxis    [place] - required ED visit    Social History:   Social History   Socioeconomic History   Marital status: Married    Spouse name: Lorriane Shire   Number of children: 2   Years of education: Not on file   Highest education level: Not on file  Occupational History   Occupation: self employed    Fish farm manager: UNEMPLOYED  Tobacco Use   Smoking status: Former    Years: 16    Types: Cigarettes    Quit date: 12/29/1985    Years since quitting: 37.2   Smokeless tobacco: Never  Vaping Use   Vaping Use: Never used  Substance and Sexual Activity   Alcohol use: Not Currently   Drug use: No   Sexual activity: Yes    Partners: Female  Other Topics Concern   Not on file  Social History Narrative   Exercise-- 3 days   Pt has hs degree   Right handed   One story home   Drinks caffeine 2 cups in am   Are you right handed or left handed?    Are you currently employed ?    What is your current occupation? retired   Do you live at home alone?   Who lives with you?  wife   What type of home do you live in: 1 story or 2 story?        Social Determinants of Health   Financial Resource Strain: Medium Risk (01/19/2023)   Overall Financial Resource Strain (CARDIA)    Difficulty of Paying Living Expenses: Somewhat hard  Food Insecurity: Food Insecurity  Present (03/17/2023)   Hunger Vital Sign    Worried About Running Out of Food in the Last Year: Never true    Ran Out of Food in the Last Year: Often true  Transportation Needs: No Transportation Needs (03/17/2023)   PRAPARE - Hydrologist (Medical): No    Lack of Transportation (Non-Medical): No  Physical Activity: Sufficiently Active (02/04/2022)   Exercise Vital Sign    Days of Exercise per Week: 7 days    Minutes of Exercise per Session: 30 min  Stress: Stress Concern Present (01/19/2023)   [place]    Feeling of Stress : To some extent  Social Connections: Moderately Integrated (01/27/2023)   Social Connection and Isolation Panel [NHANES]    Frequency of Communication with Friends and Family: More than three times a week    Frequency of Social Gatherings with Friends and Family: More than three times a week    Attends Religious Services: More than 4 times per year    Active Member of [employer] or Organizations: No    Attends Archivist Meetings: Not on file    Marital Status: Married  Human resources officer Violence: Not At Risk (03/17/2023)   Humiliation, Afraid, Rape, and Kick questionnaire    Fear of Current or Ex-Partner: No    Emotionally Abused: No    Physically Abused: No    Sexually Abused: No    Family History:    Family History  Problem Relation Age of Onset   COPD Mother    Stroke Father    Ovarian cancer Sister    Stomach cancer Sister    Heart disease Sister  MI   Heart disease Sister        MI   Stomach cancer Maternal Grandmother    Alcohol abuse Other    Depression Other    Arthritis Other    Hypertension Other    Coronary artery disease Other    Ovarian cancer Other        neice   Ovarian cancer Other        neice   Colon polyps Neg Hx    Colon cancer Neg Hx      ROS:  Please see the history of present illness.  All other ROS reviewed and  negative.     Physical Exam/Data:   Vitals:   03/17/23 2211 03/18/23 0218 03/18/23 0638 03/18/23 0803  BP: (!) 178/87 116/72 (!) 91/59 (!) 104/54  Pulse: 72 60 60 60  Resp: 18 18 18 18   Temp: 97.6 F (36.4 C) 97.8 F (36.6 C) 97.8 F (36.6 C) 97.7 F (36.5 C)  TempSrc: Oral Oral Oral Oral  SpO2: 95% 94% 94% 93%  Weight: 89.3 kg     Height: 5\' 11"  (1.803 m)       Intake/Output Summary (Last 24 hours) at 03/18/2023 1154 Last data filed at 03/18/2023 0042 Gross per 24 hour  Intake --  Output 340 ml  Net -340 ml      03/17/2023   10:11 PM 03/17/2023    1:58 PM 03/12/2023   11:02 AM  Last 3 Weights  Weight (lbs) 196 lb 13.9 oz 200 lb 204 lb  Weight (kg) 89.3 kg 90.719 kg 92.534 kg     Body mass index is 27.46 kg/m.  General: Well developed, well nourished, in no acute distress. Head: Normocephalic, atraumatic, sclera non-icteric, no xanthomas, nares are without discharge. Neck: Negative for carotid bruits. JVP not elevated. Lungs: Clear bilaterally to auscultation without wheezes, rales, or rhonchi. Breathing is unlabored. Heart: RRR S1 S2 minimal SEM without rubs or gallops.  Abdomen: Soft, non-tender, non-distended with normoactive bowel sounds. No rebound/guarding. Extremities: No clubbing or cyanosis. No edema. Distal pedal pulses are 2+ and equal bilaterally. Neuro: Alert and oriented X 3. Moves all extremities spontaneously. Psych:  Responds to questions appropriately with a normal affect.   EKG:  The EKG was personally reviewed and demonstrates:  NSR 66bpm, probable LAE, nonspecific TW change in lead III Telemetry:  Telemetry was personally reviewed and demonstrates:  NSR  Relevant CV Studies: 2d echo 11/2021   1. Left ventricular ejection fraction, by estimation, is 60 to 65%. The  left ventricle has normal function. The left ventricle has no regional  wall motion abnormalities. There is moderate asymmetric left ventricular  hypertrophy of the basal-septal   segment. Left ventricular diastolic parameters are indeterminate.   2. Right ventricular systolic function is normal. The right ventricular  size is normal. There is mildly elevated pulmonary artery systolic  pressure. The estimated right ventricular systolic pressure is [id number] mmHg.   3. Left atrial size was mildly dilated.   4. The mitral valve is normal in structure. Trivial mitral valve  regurgitation. No evidence of mitral stenosis.   5. The aortic valve is tricuspid. Aortic valve regurgitation is trivial.  Mild aortic valve stenosis. Vmax 2.2 m/s, MG 34mmHg, AVA 2.0 cm^2, DI 0.54   6. The inferior vena cava is dilated in size with <50% respiratory  variability, suggesting right atrial pressure of 15 mmHg.   Laboratory Data:  High Sensitivity Troponin:   Recent Labs  Lab 03/17/23 1451  03/17/23 1648  TROPONINIHS 3 3     Chemistry Recent Labs  Lab 03/17/23 1451  NA 133*  K 3.7  CL 101  CO2 24  GLUCOSE 140*  BUN 27*  CREATININE 0.90  CALCIUM 9.0  GFRNONAA >60  ANIONGAP 8    No results for input(s): "PROT", "ALBUMIN", "AST", "ALT", "ALKPHOS", "BILITOT" in the last 168 hours. Lipids No results for input(s): "CHOL", "TRIG", "HDL", "LABVLDL", "LDLCALC", "CHOLHDL" in the last 168 hours.  Hematology Recent Labs  Lab 03/17/23 1451  WBC 8.8  RBC 5.94*  HGB 16.6  HCT 48.5  MCV 81.6  MCH 27.9  MCHC 34.2  RDW 12.8  PLT 180   Thyroid No results for input(s): "TSH", "FREET4" in the last 168 hours.  BNP Recent Labs  Lab 03/17/23 1500  BNP 45.2    DDimer No results for input(s): "DDIMER" in the last 168 hours.   Radiology/Studies:  DG Chest 2 View  Result Date: 03/17/2023 CLINICAL DATA:  Left-sided chest pain for 3 days. Some shortness of breath. EXAM: CHEST - 2 VIEW COMPARISON:  Chest radiographs 09/10/2022 in 05/14/2022; CT chest and chest radiographs 01/24/2022 FINDINGS: Status post median sternotomy. Cardiac silhouette and mediastinal contours are unchanged and  within normal limits. Mild-to-moderate calcification within the aortic arch. Left costophrenic angle blunting likely from a combination of the pericardial fat pad and chronic pleural thickening/scarring, unchanged from 09/10/2022 and 05/14/2022. Minimal bilateral lower lung interstitial thickening is unchanged from multiple prior radiographs and chronic. No acute lung opacity. No pleural effusion or pneumothorax. Mild-to-moderate multilevel degenerative disc changes of the thoracic spine. IMPRESSION: 1. No active cardiopulmonary disease. 2. 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 Electronically Signed   By: Yvonne Kendall M.D.   On: 03/17/2023 14:40     Assessment and Plan:   1. Chest pain with mixed features, known CAD s/p prior PCI 2021, CABG 2022 - no evidence of acute ischemia by EKG or troponins - history somewhat difficult to sort out given chronicity of symptoms over the last few years though he does report he had felt better after CABG - continue ASA (will defer dosing to primary cardiologist who has maintained him on 325mg  dosing) - resume ezetimibe at DC unless held for other medical reasons - hold metoprolol as this was previously stopped due to pt concerns for low HR at home - will discuss further eval with MD   2. Hyponatremia - etiology unclear, further per primary team - if persists may need chest imaging to see if any unifying dx for CP/hyponatremia  3. Essential HTN - labile, peak value 178/87 here, nadir 91/59, currently 123/89 - add on TSH to labs - manage in context above  4. Hyperlipidemia - per notes, has been followed by PCP, saw pharmD there 01/2023 as patient had previously stopped pravastatin and rosuvastatin and was started on Zetia - continue Zetia and continue f/u PCP  5. Mild AS by echo 11/2021 - very mild murmur on exam - consider OP f/u  Also has f/u with Dr. Harriet Masson in April  Risk Assessment/Risk Scores:                 For questions or updates, please contact [place] Please consult [email or web address] for contact info under    Signed, Charlie Pitter, PA-C  03/18/2023 11:54 AM  Personally seen and examined. Agree with above.  67 year old male status post CABG in 2012 LIMA to LAD SVG to OM with suboptimal  Plavix platelet inhibition here with chest discomfort.  Been having difficulties for quite some time off and on in clinic.  We will go ahead and proceed with left heart catheterization.  Risks and benefits of been explained including stroke heart attack death renal impairment bleeding.  He is willing to proceed.  Discussed this with his wife as well.  Creatinine 0.9, hemoglobin 16.6  Candee Furbish, MD

## 2023-03-18 NOTE — Progress Notes (Signed)
CSW received consult for food resources for patient. CSW spoke with patient. CSW offered patient food resources. Patient accepted. All questions answered. No further questions reported at this time.

## 2023-03-18 NOTE — Discharge Summary (Signed)
Physician Discharge Summary  Ronald Petersen [id number] DOB: 1956-04-19 DOA: 03/17/2023  PCP: [name], DO  Admit date: 03/17/2023 Discharge date: 03/18/2023 Admitted From: Home Disposition: Home Recommendations for Outpatient Follow-up:  Follow up with PCP in in 1 week Cardiology to arrange outpatient follow-up Check BP, CMP and CBC at follow-up Please follow up on the following pending results: None  Home Health: Not indicated. Equipment/Devices: Not indicated.  Discharge Condition: Stable CODE STATUS: Full code  Follow-up Information     [name], DO. Schedule an appointment as soon as possible for a visit in 1 week(s).   Specialty: Family Medicine Contact information: [address] [place] [address] [phone]                 Hospital course 67 year old M with PMH of CAD/CABG in 11/2021, DM-2, COPD, OSA not on CPAP, chronic pain, HTN and HLD presenting with typical chest pain for 3 days with associated dyspnea, diaphoresis and elevated blood pressure, and admitted for chest pain evaluation.  In ED, stable vitals except for slightly elevated BP.  EKG features sinus rhythm with RAD.  Basic labs, serial troponin, BNP and chest x-ray without significant finding.  Cardiology consulted.   The next day, chest pain resolved.  Blood pressure improved.  LHC with nonobstructive CAD and patent graft (see below).  Patient was cleared for discharge by cardiology for outpatient follow-up.  See individual problem list below for more.   Problems addressed during this hospitalization Principal Problem:   Unstable angina (HCC) Active Problems:   CAD S/P DES PCI LAD & LCx-OM   OSA (obstructive sleep apnea)   Hyperlipidemia associated with type 2 diabetes mellitus (HCC)   Chest pain   Hypertension   Type 2 diabetes mellitus with diabetic polyneuropathy, without long-term current use of insulin (HCC)   Chronic pain syndrome    Coronary artery disease involving native coronary artery of native heart with unstable angina pectoris (HCC)   Abdominal pain              Vital signs Vitals:   03/18/23 1221 03/18/23 1405 03/18/23 1432 03/18/23 1522  BP: 123/89   120/75  Pulse: (!) 56   (!) 56  Temp: (!) 97.4 F (36.3 C)   (!) 97.5 F (36.4 C)  Resp: 16   15  Height:      Weight:      SpO2: 92% 96% 93% 96%  TempSrc: Oral   Oral  BMI (Calculated):         Discharge exam  GENERAL: No apparent distress.  Nontoxic. HEENT: MMM.  Vision and hearing grossly intact.  NECK: Supple.  No apparent JVD.  RESP:  No IWOB.  Fair aeration bilaterally. CVS:  RRR. Heart sounds normal.  ABD/GI/GU: BS+. Abd soft, NTND.  MSK/EXT:  Moves extremities. No apparent deformity. No edema.  SKIN: no apparent skin lesion or wound NEURO: Awake and alert. Oriented appropriately.  No apparent focal neuro deficit. PSYCH: Calm. Normal affect.   Discharge Instructions Discharge Instructions     Call MD for:  difficulty breathing, headache or visual disturbances   Complete by: As directed    Call MD for:  extreme fatigue   Complete by: As directed    Call MD for:  severe uncontrolled pain   Complete by: As directed    Diet - low sodium heart healthy   Complete by: As directed    Diet Carb Modified  Complete by: As directed    Discharge instructions   Complete by: As directed    It has been a pleasure taking care of you!  You were hospitalized due to chest pain.  Unclear what caused your chest pain but it does resolve.  Your heart catheterization did not reveal significant finding to explain your pain.  Continue using your Protonix and your other medications.   Take care,   Increase activity slowly   Complete by: As directed       Allergies as of 03/18/2023       Reactions   Niacin Anaphylaxis   Flushing - required ED visit        Medication List     TAKE these medications    acetaminophen 650 MG CR  tablet Commonly known as: TYLENOL Take 650 mg by mouth every 8 (eight) hours as needed for pain.   Anoro Ellipta 62.5-25 MCG/ACT Aepb Generic drug: umeclidinium-vilanterol Inhale 1 puff into the lungs daily.   aspirin EC 325 MG tablet Take 1 tablet (325 mg total) by mouth daily.   ELDERBERRY PO Take 300 mg by mouth daily.   ezetimibe 10 MG tablet Commonly known as: ZETIA Take 1 tablet (10 mg total) by mouth daily. For cholesterol   famotidine 20 MG tablet Commonly known as: PEPCID Take 1 tablet (20 mg total) by mouth daily.   fenofibrate 160 MG tablet Take 1 tablet (160 mg total) by mouth daily.   gabapentin 800 MG tablet Commonly known as: Neurontin Take 1 tablet (800 mg total) by mouth 3 (three) times daily.   Jardiance 10 MG Tabs tablet Generic drug: empagliflozin Take 1 tablet (10 mg total) by mouth daily.   Magnesium Citrate 200 MG Tabs Take 400 mg by mouth daily.   ondansetron 4 MG tablet Commonly known as: ZOFRAN Take 1 tablet (4 mg total) by mouth every 8 (eight) hours as needed.   OneTouch Verio test strip Generic drug: glucose blood USE AS INSTRUCTED TO CHECK BLOOD SUGAR ONCE A DAY   pantoprazole 40 MG tablet Commonly known as: PROTONIX Take 1 tablet (40 mg total) by mouth 2 (two) times daily.   sertraline 100 MG tablet Commonly known as: ZOLOFT Take 1.5 tablets (150 mg total) by mouth daily.   tiZANidine 4 MG tablet Commonly known as: ZANAFLEX Take 1 tablet (4 mg total) by mouth every 6 (six) hours as needed for muscle spasms.   traZODone 50 MG tablet Commonly known as: DESYREL Take 1 tablet (50 mg total) by mouth at bedtime as needed for sleep.   vitamin E 180 MG (400 UNITS) capsule Take 1,600 Units by mouth daily.        Consultations: Cardiology  Procedures/Studies:   CARDIAC CATHETERIZATION  Result Date: 03/18/2023   Mid LM to Ost LAD lesion is 40% stenosed with 40% stenosed side branch in Ost Cx.  SVG to OM is widely patent.    Ost LAD to Prox LAD lesion is 95% stenosed.  LIMA to LAD is widely patent.   1st Diag lesion is 25% stenosed.   RPAV lesion is 50% stenosed.  Small vessel.   Non-stenotic Prox LAD to Mid LAD lesion was previously treated.   Prior circumflex stent is widely patent.   Prior OM1 stent is patent.   The left ventricular systolic function is normal.   LV end diastolic pressure is normal.  LVEDP 8 mm Hg.   The left ventricular ejection fraction is 55-65% by visual estimate.  There is no aortic valve stenosis. Continue aggressive secondary prevention.   DG Chest 2 View  Result Date: 03/17/2023 CLINICAL DATA:  Left-sided chest pain for 3 days. Some shortness of breath. EXAM: CHEST - 2 VIEW COMPARISON:  Chest radiographs 09/10/2022 in 05/14/2022; CT chest and chest radiographs 01/24/2022 FINDINGS: Status post median sternotomy. Cardiac silhouette and mediastinal contours are unchanged and within normal limits. Mild-to-moderate calcification within the aortic arch. Left costophrenic angle blunting likely from a combination of the pericardial fat pad and chronic pleural thickening/scarring, unchanged from 09/10/2022 and 05/14/2022. Minimal bilateral lower lung interstitial thickening is unchanged from multiple prior radiographs and chronic. No acute lung opacity. No pleural effusion or pneumothorax. Mild-to-moderate multilevel degenerative disc changes of the thoracic spine. IMPRESSION: 1. No active cardiopulmonary disease. 2. 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 Electronically Signed   By: Yvonne Kendall M.D.   On: 03/17/2023 14:40   DG Shoulder Left  Result Date: 03/14/2023 CLINICAL DATA:  Pain EXAM: LEFT SHOULDER - 2+ VIEW COMPARISON:  None Available. FINDINGS: There is no evidence of fracture or dislocation. There is no evidence of arthropathy or other focal bone abnormality. Soft tissues are unremarkable. IMPRESSION: Negative. Electronically Signed   By: Dorise Bullion III M.D.   On: 03/14/2023 14:24       The results of significant diagnostics from this hospitalization (including imaging, microbiology, ancillary and laboratory) are listed below for reference.     Microbiology: Recent Results (from the past 240 hour(s))  Resp panel by RT-PCR (RSV, Flu A&B, Covid) Anterior Nasal Swab     Status: None   Collection Time: 03/17/23  2:17 PM   Specimen: Anterior Nasal Swab  Result Value Ref Range Status   SARS Coronavirus 2 by RT PCR NEGATIVE NEGATIVE Final    Comment: (NOTE) SARS-CoV-2 target nucleic acids are NOT DETECTED.  The SARS-CoV-2 RNA is generally detectable in upper respiratory specimens during the acute phase of infection. The lowest concentration of SARS-CoV-2 viral copies this assay can detect is 138 copies/mL. A negative result does not preclude SARS-Cov-2 infection and should not be used as the sole basis for treatment or other patient management decisions. A negative result may occur with  improper specimen collection/handling, submission of specimen other than nasopharyngeal swab, presence of viral mutation(s) within the areas targeted by this assay, and inadequate number of viral copies(<138 copies/mL). A negative result must be combined with clinical observations, patient history, and epidemiological information. The expected result is Negative.  Fact Sheet for Patients:  EntrepreneurPulse.com.au  Fact Sheet for Healthcare Providers:  IncredibleEmployment.be  This test is no t yet approved or cleared by the [place] FDA and  has been authorized for detection and/or diagnosis of SARS-CoV-2 by FDA under an Emergency Use Authorization (EUA). This EUA will remain  in effect (meaning this test can be used) for the duration of the COVID-19 declaration under Section 564(b)(1) of the Act, 21 U.S.C.section 360bbb-3(b)(1), unless the authorization is terminated  or revoked sooner.        Influenza A by PCR NEGATIVE NEGATIVE Final   Influenza B by PCR NEGATIVE NEGATIVE Final    Comment: (NOTE) The Xpert Xpress SARS-CoV-2/FLU/RSV plus assay is intended as an aid in the diagnosis of influenza from Nasopharyngeal swab specimens and should not be used as a sole basis for treatment. Nasal washings and aspirates are unacceptable for Xpert Xpress SARS-CoV-2/FLU/RSV testing.  Fact Sheet for Patients: EntrepreneurPulse.com.au  Fact Sheet for Healthcare Providers: IncredibleEmployment.be  This test is not yet approved or cleared by the [place] and has been authorized for detection and/or diagnosis of SARS-CoV-2 by FDA under an Emergency Use Authorization (EUA). This EUA will remain in effect (meaning this test can be used) for the duration of the COVID-19 declaration under Section 564(b)(1) of the Act, 21 U.S.C. section 360bbb-3(b)(1), unless the authorization is terminated or revoked.     Resp Syncytial Virus by PCR NEGATIVE NEGATIVE Final    Comment: (NOTE) Fact Sheet for Patients: EntrepreneurPulse.com.au  Fact Sheet for Healthcare Providers: IncredibleEmployment.be  This test is not yet approved or cleared by the [place] FDA and has been authorized for detection and/or diagnosis of SARS-CoV-2 by FDA under an Emergency Use Authorization (EUA). This EUA will remain in effect (meaning this test can be used) for the duration of the COVID-19 declaration under Section 564(b)(1) of the Act, 21 U.S.C. section 360bbb-3(b)(1), unless the authorization is terminated or revoked.  Performed at [facility], [address] [place], [place] [address]      Labs:  CBC: Recent Labs  Lab 03/17/23 1451  WBC 8.8  HGB 16.6  HCT 48.5  MCV 81.6  PLT 180   BMP &GFR Recent Labs  Lab 03/17/23 1451  NA 133*  K 3.7  CL 101  CO2 24  GLUCOSE 140*  BUN 27*   CREATININE 0.90  CALCIUM 9.0   Estimated Creatinine Clearance: 84.8 mL/min (by C-G formula based on SCr of 0.9 mg/dL). Liver & Pancreas: No results for input(s): "AST", "ALT", "ALKPHOS", "BILITOT", "PROT", "ALBUMIN" in the last 168 hours. No results for input(s): "LIPASE", "AMYLASE" in the last 168 hours. No results for input(s): "AMMONIA" in the last 168 hours. Diabetic: No results for input(s): "HGBA1C" in the last 72 hours. Recent Labs  Lab 03/17/23 2239 03/18/23 0757 03/18/23 1219  GLUCAP 174* 138* 132*   Cardiac Enzymes: No results for input(s): "CKTOTAL", "CKMB", "CKMBINDEX", "TROPONINI" in the last 168 hours. No results for input(s): "PROBNP" in the last 8760 hours. Coagulation Profile: No results for input(s): "INR", "PROTIME" in the last 168 hours. Thyroid Function Tests: Recent Labs    03/18/23 0555  TSH 1.971   Lipid Profile: No results for input(s): "CHOL", "HDL", "LDLCALC", "TRIG", "CHOLHDL", "LDLDIRECT" in the last 72 hours. Anemia Panel: No results for input(s): "VITAMINB12", "FOLATE", "FERRITIN", "TIBC", "IRON", "RETICCTPCT" in the last 72 hours. Urine analysis:    Component Value Date/Time   COLORURINE YELLOW 12/23/2021 1120   APPEARANCEUR CLEAR 12/23/2021 1120   LABSPEC 1.015 12/23/2021 1120   PHURINE 7.5 12/23/2021 1120   GLUCOSEU 250 (A) 12/23/2021 1120   HGBUR NEGATIVE 12/23/2021 1120   HGBUR small 11/05/2010 0941   BILIRUBINUR NEGATIVE 12/23/2021 1120   BILIRUBINUR 1+ 06/07/2021 1403   KETONESUR NEGATIVE 12/23/2021 1120   PROTEINUR NEGATIVE 12/23/2021 1120   UROBILINOGEN 2.0 (A) 06/07/2021 1403   UROBILINOGEN negative 11/05/2010 0941   NITRITE NEGATIVE 12/23/2021 1120   LEUKOCYTESUR NEGATIVE 12/23/2021 1120   Sepsis Labs: Invalid input(s): "PROCALCITONIN", "LACTICIDVEN"   SIGNED:  Mercy Riding, MD  [facility] 03/18/2023, 3:37 PM

## 2023-03-18 NOTE — Care Management Obs Status (Signed)
[facility] NOTIFICATION   Patient Details  Name: KEIYON LARE MRN: [id number] Date of Birth: 06/18/1956   Medicare Observation Status Notification Given:  Yes    Bethena Roys, RN 03/18/2023, 4:23 PM

## 2023-03-19 ENCOUNTER — Encounter (HOSPITAL_COMMUNITY): Payer: Self-pay | Admitting: Interventional Cardiology

## 2023-03-23 ENCOUNTER — Other Ambulatory Visit (HOSPITAL_BASED_OUTPATIENT_CLINIC_OR_DEPARTMENT_OTHER): Payer: Self-pay

## 2023-03-24 ENCOUNTER — Ambulatory Visit: Payer: Medicare HMO

## 2023-03-30 ENCOUNTER — Encounter: Payer: Self-pay | Admitting: Family Medicine

## 2023-03-30 ENCOUNTER — Ambulatory Visit (INDEPENDENT_AMBULATORY_CARE_PROVIDER_SITE_OTHER): Payer: Medicare HMO | Admitting: Family Medicine

## 2023-03-30 VITALS — BP 118/82 | HR 61 | Temp 98.2°F | Resp 18 | Ht 71.0 in | Wt 201.2 lb

## 2023-03-30 DIAGNOSIS — R5382 Chronic fatigue, unspecified: Secondary | ICD-10-CM

## 2023-03-30 DIAGNOSIS — R0609 Other forms of dyspnea: Secondary | ICD-10-CM | POA: Diagnosis not present

## 2023-03-30 DIAGNOSIS — R0789 Other chest pain: Secondary | ICD-10-CM

## 2023-03-30 DIAGNOSIS — M4802 Spinal stenosis, cervical region: Secondary | ICD-10-CM | POA: Diagnosis not present

## 2023-03-30 DIAGNOSIS — E1169 Type 2 diabetes mellitus with other specified complication: Secondary | ICD-10-CM

## 2023-03-30 DIAGNOSIS — I700 Atherosclerosis of aorta: Secondary | ICD-10-CM | POA: Diagnosis not present

## 2023-03-30 DIAGNOSIS — I25119 Atherosclerotic heart disease of native coronary artery with unspecified angina pectoris: Secondary | ICD-10-CM | POA: Diagnosis not present

## 2023-03-30 DIAGNOSIS — E1165 Type 2 diabetes mellitus with hyperglycemia: Secondary | ICD-10-CM

## 2023-03-30 DIAGNOSIS — I10 Essential (primary) hypertension: Secondary | ICD-10-CM | POA: Diagnosis not present

## 2023-03-30 DIAGNOSIS — E785 Hyperlipidemia, unspecified: Secondary | ICD-10-CM | POA: Diagnosis not present

## 2023-03-30 LAB — COMPREHENSIVE METABOLIC PANEL
ALT: 18 U/L (ref 0–53)
AST: 14 U/L (ref 0–37)
Albumin: 4.4 g/dL (ref 3.5–5.2)
Alkaline Phosphatase: 86 U/L (ref 39–117)
BUN: 18 mg/dL (ref 6–23)
CO2: 27 mEq/L (ref 19–32)
Calcium: 9.6 mg/dL (ref 8.4–10.5)
Chloride: 107 mEq/L (ref 96–112)
Creatinine, Ser: 0.9 mg/dL (ref 0.40–1.50)
GFR: 88.22 mL/min (ref >60.00)
Glucose, Bld: 114 mg/dL — ABNORMAL HIGH (ref 70–99)
Potassium: 4.6 mEq/L (ref 3.5–5.1)
Sodium: 139 mEq/L (ref 135–145)
Total Bilirubin: 0.6 mg/dL (ref 0.2–1.2)
Total Protein: 7 g/dL (ref 6.0–8.3)

## 2023-03-30 LAB — CBC WITH DIFFERENTIAL/PLATELET
Basophils Absolute: 0.1 10*3/uL (ref 0.0–0.1)
Basophils Relative: 0.7 % (ref 0.0–3.0)
Eosinophils Absolute: 0.2 10*3/uL (ref 0.0–0.7)
Eosinophils Relative: 2.7 % (ref 0.0–5.0)
HCT: 47.3 % (ref 39.0–52.0)
Hemoglobin: 15.9 g/dL (ref 13.0–17.0)
Lymphocytes Relative: 20.3 % (ref 12.0–46.0)
Lymphs Abs: 1.4 10*3/uL (ref 0.7–4.0)
MCHC: 33.7 g/dL (ref 30.0–36.0)
MCV: 83.9 fl (ref 78.0–100.0)
Monocytes Absolute: 0.6 10*3/uL (ref 0.1–1.0)
Monocytes Relative: 8.7 % (ref 3.0–12.0)
Neutro Abs: 4.8 10*3/uL (ref 1.4–7.7)
Neutrophils Relative %: 67.6 % (ref 43.0–77.0)
Platelets: 174 10*3/uL (ref 150.0–400.0)
RBC: 5.64 Mil/uL (ref 4.22–5.81)
RDW: 13.8 % (ref 11.5–15.5)
WBC: 7.1 10*3/uL (ref 4.0–10.5)

## 2023-03-30 LAB — VITAMIN D 25 HYDROXY (VIT D DEFICIENCY, FRACTURES): VITD: 37.26 ng/mL (ref 30.00–100.00)

## 2023-03-30 LAB — LIPID PANEL
Cholesterol: 171 mg/dL (ref 0–200)
HDL: 42.2 mg/dL (ref >39.00)
LDL Cholesterol: 104 mg/dL — ABNORMAL HIGH (ref 0–99)
NonHDL: 129.25
Total CHOL/HDL Ratio: 4
Triglycerides: 128 mg/dL (ref 0.0–149.0)
VLDL: 25.6 mg/dL (ref 0.0–40.0)

## 2023-03-30 LAB — HEMOGLOBIN A1C: Hgb A1c MFr Bld: 7.2 % — ABNORMAL HIGH (ref 4.6–6.5)

## 2023-03-30 LAB — MICROALBUMIN / CREATININE URINE RATIO
Creatinine,U: 82.5 mg/dL
Microalb Creat Ratio: 0.8 mg/g (ref 0.0–30.0)
Microalb, Ur: <0.7 mg/dL (ref 0.0–1.9)

## 2023-03-30 LAB — VITAMIN B12: Vitamin B-12: 482 pg/mL (ref 211–911)

## 2023-03-30 NOTE — Assessment & Plan Note (Signed)
Has resolved 

## 2023-03-30 NOTE — Assessment & Plan Note (Signed)
hgba1c to be checked, minimize simple carbs. Increase exercise as tolerated. Continue current meds  

## 2023-03-30 NOTE — Assessment & Plan Note (Signed)
Will have to put off surgery until medically improved

## 2023-03-30 NOTE — Progress Notes (Signed)
Subjective:   By signing my name below, I, Daiva Huge, attest that this documentation has been prepared under the direction and in the presence of [name], DO 03/30/23   Patient ID: Ronald Petersen, male    DOB: 16-Jan-1956, 67 y.o.   MRN: [id number]  Chief Complaint  Patient presents with   Hospitalization Follow-up    HPI Patient is in today for a hospital follow-up.  Admitted in hospital from 03/17/23 to 03/18/23 for chest pain, dyspnea, diaphoresis, elevated blood pressure that had been occurring for the past 3 days. LHC with nonobstructive CAD and patent graft on 03/18/23. CT chest showed no active cardiopulmonary disease, chronic left costophrenic angle blunting likely from chronic pleural thickening/scarring. Chest pain resolved and blood pressure improved upon discharge. Follow-up with cardiologist scheduled for 04/14/23.  Chest pain has resolved but he is still experiencing severe fatigue, shortness of breath upon walking. Status post coronary artery bypass graft 2022, coronary stent intervention 2021. Followed by pulmonologist, Dr. June Leap. Using Anoro Ellipta inhaler, which helps shortness of breath.  Past Medical History:  Diagnosis Date   Anxiety    Arthritis    CAD (coronary artery disease)    Cancer    Complication of anesthesia    aspirated with back surgery at age 43   Depression    Diabetes mellitus Type II    GERD (gastroesophageal reflux disease)    Hyperlipidemia    Hypertension    Mild aortic stenosis    Mild carotid artery disease    Neuromuscular disorder    NEUROPATHY   Plavix resistance    abnormal cytochrome P450 2c19 genotype in 2021 inferring suboptimal Plavix platelet inhibition,   Right rotator cuff tear 11/23/2018   Sleep apnea    no CPAP    Past Surgical History:  Procedure Laterality Date   APPENDECTOMY     ARTHOSCOPIC ROTAOR CUFF REPAIR Right 11/23/2018   Procedure: ARTHROSCOPIC ROTATOR CUFF REPAIR;  Surgeon:  [name], MD;  Location: [place];  Service: Orthopedics;  Laterality: Right;   CHOLECYSTECTOMY     CORONARY ARTERY BYPASS GRAFT N/A 12/24/2021   x2 LIMA to LAD; SVG to Obtuse Marginal   CORONARY STENT INTERVENTION N/A 11/19/2020   Procedure: CORONARY STENT INTERVENTION;  Surgeon: Nelva Bush, MD;  Location: [place] [facility];  Service: Cardiovascular;  Laterality: N/A;   CORONARY ULTRASOUND/IVUS N/A 11/19/2020   Procedure: Intravascular Ultrasound/IVUS;  Surgeon: Nelva Bush, MD;  Location: [place] [facility];  Service: Cardiovascular;  Laterality: N/A;   ENDOVEIN HARVEST OF GREATER SAPHENOUS VEIN Right 12/24/2021   Procedure: ENDOVEIN HARVEST OF GREATER SAPHENOUS VEIN;  Surgeon: Lajuana Matte, MD;  Location: [place];  Service: Open Heart Surgery;  Laterality: Right;   IR THORACENTESIS ASP PLEURAL SPACE W/IMG GUIDE  01/31/2022   LEFT HEART CATH AND CORONARY ANGIOGRAPHY N/A 11/19/2020   Procedure: LEFT HEART CATH AND CORONARY ANGIOGRAPHY;  Surgeon: Nelva Bush, MD;  Location: [place] [facility];  Service: Cardiovascular;  Laterality: N/A;   LEFT HEART CATH AND CORONARY ANGIOGRAPHY N/A 12/24/2020   Procedure: LEFT HEART CATH AND CORONARY ANGIOGRAPHY;  Surgeon: Martinique, Peter M, MD;  Location: [place] [facility];  Service: Cardiovascular;  Laterality: N/A;   LEFT HEART CATH AND CORONARY ANGIOGRAPHY N/A 12/16/2021   Procedure: LEFT HEART CATH AND CORONARY ANGIOGRAPHY;  Surgeon: Leonie Man, MD;  Location: [place] [facility];  Service: Cardiovascular;  Laterality: N/A;   LEFT HEART CATH AND  CORS/GRAFTS ANGIOGRAPHY N/A 03/18/2023   Procedure: LEFT HEART CATH AND CORS/GRAFTS ANGIOGRAPHY;  Surgeon: Jettie Booze, MD;  Location: [place] [facility];  Service: Cardiovascular;  Laterality: N/A;   LUMBAR LAMINECTOMY     SHOULDER ARTHROSCOPY WITH ROTATOR CUFF REPAIR AND SUBACROMIAL DECOMPRESSION Right 11/23/2018   Procedure: SHOULDER ARTHROSCOPY WITH  ROTATOR CUFF REPAIR AND SUBACROMIAL DECOMPRESSION;  Surgeon: [name], MD;  Location: [place];  Service: Orthopedics;  Laterality: Right;   TEE WITHOUT CARDIOVERSION N/A 12/24/2021   Procedure: TRANSESOPHAGEAL ECHOCARDIOGRAM (TEE);  Surgeon: Lajuana Matte, MD;  Location: [place];  Service: Open Heart Surgery;  Laterality: N/A;    Family History  Problem Relation Age of Onset   COPD Mother    Stroke Father    Ovarian cancer Sister    Stomach cancer Sister    Heart disease Sister        MI   Heart disease Sister        MI   Stomach cancer Maternal Grandmother    Alcohol abuse Other    Depression Other    Arthritis Other    Hypertension Other    Coronary artery disease Other    Ovarian cancer Other        neice   Ovarian cancer Other        neice   Colon polyps Neg Hx    Colon cancer Neg Hx     Social History   Socioeconomic History   Marital status: Married    Spouse name: Lorriane Shire   Number of children: 2   Years of education: Not on file   Highest education level: Not on file  Occupational History   Occupation: self employed    Fish farm manager: UNEMPLOYED  Tobacco Use   Smoking status: Former    Years: 4    Types: Cigarettes    Quit date: 12/29/1985    Years since quitting: 37.2   Smokeless tobacco: Never  Vaping Use   Vaping Use: Never used  Substance and Sexual Activity   Alcohol use: Not Currently   Drug use: No   Sexual activity: Yes    Partners: Female  Other Topics Concern   Not on file  Social History Narrative   Exercise-- 3 days   Pt has hs degree   Right handed   One story home   Drinks caffeine 2 cups in am   Are you right handed or left handed?    Are you currently employed ?    What is your current occupation? retired   Do you live at home alone?   Who lives with you?  wife   What type of home do you live in: 1 story or 2 story?        Social Determinants of Health   Financial Resource Strain: Medium Risk  (01/19/2023)   Overall Financial Resource Strain (CARDIA)    Difficulty of Paying Living Expenses: Somewhat hard  Food Insecurity: Food Insecurity Present (03/17/2023)   Hunger Vital Sign    Worried About Running Out of Food in the Last Year: Never true    Ran Out of Food in the Last Year: Often true  Transportation Needs: No Transportation Needs (03/17/2023)   PRAPARE - Hydrologist (Medical): No    Lack of Transportation (Non-Medical): No  Physical Activity: Sufficiently Active (02/04/2022)   Exercise Vital Sign    Days of Exercise per Week: 7 days    Minutes of  Exercise per Session: 30 min  Stress: Stress Concern Present (01/19/2023)   [place]    Feeling of Stress : To some extent  Social Connections: Moderately Integrated (01/27/2023)   Social Connection and Isolation Panel [NHANES]    Frequency of Communication with Friends and Family: More than three times a week    Frequency of Social Gatherings with Friends and Family: More than three times a week    Attends Religious Services: More than 4 times per year    Active Member of [employer] or Organizations: No    Attends Archivist Meetings: Not on file    Marital Status: Married  Human resources officer Violence: Not At Risk (03/17/2023)   Humiliation, Afraid, Rape, and Kick questionnaire    Fear of Current or Ex-Partner: No    Emotionally Abused: No    Physically Abused: No    Sexually Abused: No    Outpatient Medications Prior to Visit  Medication Sig Dispense Refill   acetaminophen (TYLENOL) 650 MG CR tablet Take 650 mg by mouth every 8 (eight) hours as needed for pain.     aspirin EC 325 MG EC tablet Take 1 tablet (325 mg total) by mouth daily.     ELDERBERRY PO Take 300 mg by mouth daily.     empagliflozin (JARDIANCE) 10 MG TABS tablet Take 1 tablet (10 mg total) by mouth daily. 90 tablet 1   ezetimibe (ZETIA) 10 MG tablet Take 1  tablet (10 mg total) by mouth daily. For cholesterol 90 tablet 3   famotidine (PEPCID) 20 MG tablet Take 1 tablet (20 mg total) by mouth daily. 90 tablet 1   fenofibrate 160 MG tablet Take 1 tablet (160 mg total) by mouth daily. 90 tablet 1   gabapentin (NEURONTIN) 800 MG tablet Take 1 tablet (800 mg total) by mouth 3 (three) times daily. 270 tablet 2   glucose blood (ONETOUCH VERIO) test strip USE AS INSTRUCTED TO CHECK BLOOD SUGAR ONCE A DAY 100 strip 12   Magnesium Citrate 200 MG TABS Take 400 mg by mouth daily.     ondansetron (ZOFRAN) 4 MG tablet Take 1 tablet (4 mg total) by mouth every 8 (eight) hours as needed. 20 tablet 2   pantoprazole (PROTONIX) 40 MG tablet Take 1 tablet (40 mg total) by mouth 2 (two) times daily. 60 tablet 3   sertraline (ZOLOFT) 100 MG tablet Take 1.5 tablets (150 mg total) by mouth daily. 135 tablet 1   tiZANidine (ZANAFLEX) 4 MG tablet Take 1 tablet (4 mg total) by mouth every 6 (six) hours as needed for muscle spasms. 30 tablet 1   traZODone (DESYREL) 50 MG tablet Take 1 tablet (50 mg total) by mouth at bedtime as needed for sleep. 30 tablet 3   umeclidinium-vilanterol (ANORO ELLIPTA) 62.5-25 MCG/ACT AEPB Inhale 1 puff into the lungs daily. 60 each 5   vitamin E 180 MG (400 UNITS) capsule Take 1,600 Units by mouth daily.     No facility-administered medications prior to visit.    Allergies  Allergen Reactions   Niacin Anaphylaxis    Flushing - required ED visit    Review of Systems  Constitutional:  Positive for malaise/fatigue. Negative for fever.  HENT:  Negative for congestion.   Eyes:  Negative for blurred vision.  Respiratory:  Positive for shortness of breath. Negative for cough.   Cardiovascular:  Negative for chest pain, palpitations and leg swelling.  Gastrointestinal:  Negative for  vomiting.  Musculoskeletal:  Negative for back pain.  Skin:  Negative for rash.  Neurological:  Negative for loss of consciousness and headaches.        Objective:    Physical Exam Constitutional:      General: He is not in acute distress.    Appearance: Normal appearance. He is not ill-appearing.  HENT:     Head: Normocephalic and atraumatic.     Right Ear: External ear normal.     Left Ear: External ear normal.  Eyes:     Extraocular Movements: Extraocular movements intact.     Pupils: Pupils are equal, round, and reactive to light.  Cardiovascular:     Rate and Rhythm: Normal rate and regular rhythm.     Heart sounds: Normal heart sounds. No murmur heard.    No gallop.  Pulmonary:     Effort: Pulmonary effort is normal. No respiratory distress.     Breath sounds: Normal breath sounds. No wheezing or rales.  Neurological:     Mental Status: He is alert and oriented to person, place, and time.  Psychiatric:        Judgment: Judgment normal.     BP 118/82 (BP Location: Right Arm, Patient Position: Sitting, Cuff Size: Normal)   Pulse 61   Temp 98.2 F (36.8 C) (Oral)   Resp 18   Ht 5\' 11"  (1.803 m)   Wt 201 lb 3.2 oz (91.3 kg)   SpO2 97%   BMI 28.06 kg/m  Wt Readings from Last 3 Encounters:  03/30/23 201 lb 3.2 oz (91.3 kg)  03/17/23 196 lb 13.9 oz (89.3 kg)  03/12/23 204 lb (92.5 kg)       Assessment & Plan:  Hyperlipidemia associated with type 2 diabetes mellitus Assessment & Plan: Encourage heart healthy diet such as MIND or DASH diet, increase exercise, avoid trans fats, simple carbohydrates and processed foods, consider a krill or fish or flaxseed oil cap daily.    Orders: -     Lipid panel -     Comprehensive metabolic panel  Coronary artery disease involving native coronary artery of native heart with angina pectoris  Type 2 diabetes mellitus with hyperglycemia, without long-term current use of insulin Assessment & Plan: hgba1c to be checked ,  minimize simple carbs. Increase exercise as tolerated. Continue current meds   Orders: -     Comprehensive metabolic panel -     Hemoglobin A1c -      Microalbumin / creatinine urine ratio  Essential hypertension Assessment & Plan: Well controlled, no changes to meds. Encouraged heart healthy diet such as the DASH diet and exercise as tolerated.    Orders: -     Lipid panel -     CBC with Differential/Platelet -     Comprehensive metabolic panel  Aortic atherosclerosis -     Lipid panel  Hyperlipidemia LDL goal <70 -     Lipid panel  DOE (dyspnea on exertion) -     Vitamin B12 -     VITAMIN D 25 Hydroxy (Vit-D Deficiency, Fractures) -     CT CHEST WO CONTRAST; Future  Chronic fatigue Assessment & Plan: Check labs  F/u pulmonary    Foraminal stenosis of cervical region Assessment & Plan: Will have to put off surgery until medically improved    Atypical chest pain Assessment & Plan: Has resolved       I,Alexander Ruley,acting as a scribe for [employer], DO.,have documented all relevant  documentation on the behalf of [name], DO,as directed by  [name], DO while in the presence of [name], [place], DO, personally preformed the services described in this documentation.  All medical record entries made by the scribe were at my direction and in my presence.  I have reviewed the chart and discharge instructions (if applicable) and agree that the record reflects my personal performance and is accurate and complete. 03/30/23   [name], DO

## 2023-03-30 NOTE — Assessment & Plan Note (Signed)
Encourage heart healthy diet such as MIND or DASH diet, increase exercise, avoid trans fats, simple carbohydrates and processed foods, consider a krill or fish or flaxseed oil cap daily.  °

## 2023-03-30 NOTE — Assessment & Plan Note (Signed)
Well controlled, no changes to meds. Encouraged heart healthy diet such as the DASH diet and exercise as tolerated.  °

## 2023-03-30 NOTE — Patient Instructions (Signed)
Angina  Angina is discomfort in the chest, neck, arm, jaw, or back. The discomfort is caused by a lack of blood in the middle layer of the heart wall (myocardium). There are four types of angina: Stable angina. This is triggered by vigorous activity or exercise. It goes away when you rest or take medicines that treat angina. This is diagnosed if you have had the symptom for more than 2 months. Unstable angina. This is a warning sign and can lead to a heart attack. This is a medical emergency. Symptoms come at rest and last a long time. Microvascular angina. This affects the small coronary arteries. Symptoms include chest pain, feeling tired, and being short of breath. The symptoms can last a long time or short time. Prinzmetal or variant angina. This is caused by a spasm of the arteries that go to your heart. What are the causes? This condition is usually caused by atherosclerosis. This is the buildup of fat and cholesterol (plaque) in your arteries. The plaque may narrow or block the artery. Other causes of angina include: Sudden spasms of the muscles of the arteries in the heart. Small artery disease (microvascular dysfunction). Problems with any of your heart valves. A tear in an artery in your heart (coronary artery dissection). Weakness of the heart muscle (cardiomyopathy). What increases the risk? You are more likely to develop this condition if you have: High cholesterol. High blood pressure. Diabetes. A family history of heart disease. A sedentary lifestyle, or a lifestyle in which you do not exercise enough. Depression. Had radiation treatment to the left side of your chest. Other risk factors include: Using tobacco. Being obese. Eating a diet high in saturated fats. Being exposed to high stress or triggers of stress. Using drugs, such as cocaine. Women have a greater risk for angina if they: Are older than age 55. Have gone through menopause. What are the signs or  symptoms? Common symptoms of this condition in both men and women may include: Chest pain, which may: Feel like a crushing or squeezing in the chest, or a tightness, pressure, fullness, or heaviness in the chest. Last for more than a few minutes, or stop and come back over a few minutes. Pain in the neck, arm, jaw, or back. Unexplained heartburn or indigestion. Shortness of breath. Nausea. Sudden cold sweats. Women and people with diabetes may have unusual (atypical) symptoms, such as: Fatigue. Unexplained feelings of nervousness or anxiety. Unexplained weakness. Dizziness or fainting. How is this diagnosed? This condition may be diagnosed based on: Your symptoms and medical history. Electrocardiogram (ECG) to measure the electrical activity in your heart. Blood tests. Stress test to look for signs of blockage when your heart is stressed. CT angiogram to examine your heart and the blood flow to it. Coronary angiogram to check for arterial blockage. Echocardiogram (ultrasound) to assess the strength of your heartbeat. How is this treated? Angina may be treated with: Medicines to: Prevent blood clots and heart attack. Relax blood vessels and improve blood flow to the heart. Reduce blood pressure, improve heart pumping, and relax blood vessels spasms. Reduce cholesterol and help treat atherosclerosis. A procedure to widen a narrowed or blocked coronary artery (angioplasty). A mesh tube (stent) may be placed in a coronary artery to keep it open. Surgery to allow blood to go around a blocked artery (coronary artery bypass surgery). Follow these instructions at home: Medicines Take over-the-counter and prescription medicines only as told by your health care provider. Do not take the following   medicines unless your health care provider approves: NSAIDs, such as ibuprofen or naproxen. Vitamin supplements that contain vitamin A, vitamin E, or both. Hormone replacement therapy that  contains estrogen with or without progestin. Eating and drinking  Eat a heart-healthy diet. This includes plenty of fresh fruits and vegetables, whole grains, low-fat (lean) protein, and low-fat dairy products. Follow instructions from your health care provider about eating or drinking restrictions. Activity Follow an exercise program approved by your health care provider. Consider joining a cardiac rehabilitation program. Take a break when you feel fatigued. Plan rest periods in your daily activities. Lifestyle  Do not use any products that contain nicotine or tobacco. These products include cigarettes, chewing tobacco, and vaping devices, such as e-cigarettes. If you need help quitting, ask your health care provider. If your health care provider says you can drink alcohol: Limit how much you have to: 0-1 drink a day for women who are not pregnant. 0-2 drinks a day for men. Be aware of how much alcohol is in your drink. In the [place] one drink equals one 12 oz bottle of beer (355 mL), one 5 oz glass of wine (148 mL), or one 1 oz glass of hard liquor (44 mL). General instructions Maintain a healthy weight. Learn to manage stress. Keep your vaccinations up to date. Get the flu (influenza) vaccine every year. Talk to your health care provider if you feel depressed. Take a depression screening test to see if you are at risk for depression. Work with your health care provider to manage other health conditions, such as hypertension or diabetes. Keep all follow-up visits. This is important. Get help right away if: You have pain in your chest, neck, arm, jaw, or back, and the pain: Lasts more than a few minutes. Is recurring. Is not relieved by taking medicines under the tongue (sublingual nitroglycerin). Increases in intensity or frequency. You have a lot of sweating without cause. You have unexplained: Heartburn or indigestion. Shortness of breath or difficulty breathing. Nausea or  vomiting. Fatigue. Feelings of nervousness or anxiety. Weakness. You have sudden light-headedness or dizziness. You faint. These symptoms may represent a serious problem that is an emergency. Do not wait to see if the symptoms will go away. Get medical help right away. Call your local emergency services (911 in the [place] Do not drive yourself to the hospital. Summary Angina is discomfort in the chest, neck, arm, jaw, or back that is caused by a lack of blood in the arteries of the heart wall. There are many symptoms of angina. They include chest pain, unexplained heartburn or indigestion, sudden cold sweats, and fatigue. Angina may be treated with lifestyle changes, medicines, or surgery. Symptoms of angina may represent an emergency. Get medical help right away. Call your local emergency services (911 in the [place] Do not drive yourself to the hospital. This information is not intended to replace advice given to you by your health care provider. Make sure you discuss any questions you have with your health care provider. Document Revised: 06/08/2020 Document Reviewed: 06/08/2020 Elsevier Patient Education  2023 [employer]  

## 2023-03-30 NOTE — Assessment & Plan Note (Signed)
Check labs  F/u pulmonary

## 2023-03-31 ENCOUNTER — Ambulatory Visit (HOSPITAL_BASED_OUTPATIENT_CLINIC_OR_DEPARTMENT_OTHER)
Admission: RE | Admit: 2023-03-31 | Discharge: 2023-03-31 | Disposition: A | Discharge status: Home / Self Care | Payer: Medicare HMO | Source: Ambulatory Visit | Attending: Family Medicine | Admitting: Family Medicine

## 2023-03-31 DIAGNOSIS — R911 Solitary pulmonary nodule: Secondary | ICD-10-CM | POA: Diagnosis not present

## 2023-03-31 DIAGNOSIS — R0609 Other forms of dyspnea: Secondary | ICD-10-CM | POA: Diagnosis not present

## 2023-03-31 DIAGNOSIS — R0600 Dyspnea, unspecified: Secondary | ICD-10-CM | POA: Diagnosis not present

## 2023-04-01 ENCOUNTER — Other Ambulatory Visit (HOSPITAL_BASED_OUTPATIENT_CLINIC_OR_DEPARTMENT_OTHER): Payer: Self-pay

## 2023-04-01 ENCOUNTER — Other Ambulatory Visit: Payer: Self-pay

## 2023-04-01 MED ORDER — EMPAGLIFLOZIN 25 MG PO TABS
25.0000 mg | ORAL_TABLET | Freq: Every day | ORAL | 2 refills | Status: DC
  Filled 2023-04-01 – 2023-04-14 (×2): qty 30, 30d supply, fill #0

## 2023-04-08 ENCOUNTER — Other Ambulatory Visit (HOSPITAL_BASED_OUTPATIENT_CLINIC_OR_DEPARTMENT_OTHER): Payer: Self-pay

## 2023-04-08 ENCOUNTER — Ambulatory Visit (INDEPENDENT_AMBULATORY_CARE_PROVIDER_SITE_OTHER): Payer: Medicare HMO

## 2023-04-08 DIAGNOSIS — G4700 Insomnia, unspecified: Secondary | ICD-10-CM

## 2023-04-08 DIAGNOSIS — R110 Nausea: Secondary | ICD-10-CM

## 2023-04-08 DIAGNOSIS — E1169 Type 2 diabetes mellitus with other specified complication: Secondary | ICD-10-CM

## 2023-04-08 DIAGNOSIS — E1165 Type 2 diabetes mellitus with hyperglycemia: Secondary | ICD-10-CM

## 2023-04-08 DIAGNOSIS — I25119 Atherosclerotic heart disease of native coronary artery with unspecified angina pectoris: Secondary | ICD-10-CM

## 2023-04-08 NOTE — Progress Notes (Signed)
Chronic Care Management Pharmacy Note  04/08/2023 Name:  Ronald Petersen MRN:  [id number] DOB:  1956/04/26  Summary:  CAD / Hyperlipidemia: Not at LDL goal of < 55 History of CABG x 2 with stent; father had stroke Current therapy - fenofibrate 160mg  daily and ezetimibe 10mg  daily  Past therapy - rosuvastatin 40mg  daily (patient declined) pravastatin 40mg  - stopped 2021; rosuvastatin 20mg  - stopped 2021. Niacin - stopped 2007 due to severe flushing requiring ED visit.   Type 2 DM:  Last A1c at goal.  Current therapy - Jardiance 10mg  daily. Dose was increased to 25mg  by Dr Shaune Pollack on 03/30/2023 but per patient the cost increased to $47 with dose increase (he states the 10mg  was on $11 - checked with pharmacy and he did pay $11.20 last time but cost if $48 this time for Jardiance suspect he might have had LIS / Extra help in the past)  Past therapy - metformin - stopped because patient was concerned about potential side effects.   Reports he checks blood glucose daily. HBG usually 120 to 140.  Patient states he has blood glucose < 100. He feels bad when blood glucose is < 100 or > 170.  Blood pressure:  BP Readings from Last 3 Encounters:  03/30/23 118/82  03/18/23 120/75  03/12/23 118/80   Anxiety / OCD:  Current therapy - sertraline 100mg  -  tablet daily  Patient reports good control with current therapy  Chronic Pain / polyneuropathy:  Seeing neurosurgeon about possible neck surgery.  Current therapy - gabapentin 800mg  3 times a day. Also prescribed writs splints for carpel tunnel.  Patient asks about pain medication today. He has taken hydrocodone in the past - recommended he discuss at upcoming appointment with Dr Jordan Likes)  Medication Management:  Reviewed med list and updated  Reviewed refill history.  Patient reports no issues with medication costs. He is getting Extra Help thru Medicare with med costs. Had only family planning plan with Medicaid.    Recommendations/Changes made from today's visit: Assisted patient in applying for LIS / Extra Help for 2024.  Start ezetimibe 10mg  daily. Continue fenofibrate 160mg  daily. Patient declined statin therapy.  We could try Nexlizet for elevated cholesterol - will wait to see if patient is approved for LIS, then try if approved.  Take Jardiance 10mg  - 2 tablets = 20mg  daily - plan to change to 25mg  in future. If he is not approved for LIS / Extra help then will apply to [facility] medication assistance program.  Discussed blood glucose goals.  Fasting blood glucose goal (before meals) = 80 to 130 Blood glucose goal after a meal = less than 180  Patient is interested in mail order option thru [facility] - sent updated Rx for 90 days for pantoprazole and trazodone. Patient provided with number for [facility] and will call them to coordinate delivery and payment.   Subjective: Ronald Petersen is an 68 y.o. year old male who is a primary patient of Donato Schultz, DO.  The patient was referred to the Chronic Care Management team for assistance with care management needs subsequent to provider initiation of CCM services and plan of care.    Engaged with patient by telephone for follow up visit in response to provider referral for CCM services.   Objective:  LABS:    Lab Results  Component Value Date   CREATININE 0.90 03/30/2023   CREATININE 0.90 03/17/2023   CREATININE 0.95 12/09/2022  Lab Results  Component Value Date   HGBA1C 7.2 (H) 03/30/2023         Component Value Date/Time   CHOL 171 03/30/2023 1137   TRIG 128.0 03/30/2023 1137   TRIG 148 12/10/2006 1603   HDL 42.20 03/30/2023 1137   CHOLHDL 4 03/30/2023 1137   VLDL 25.6 03/30/2023 1137   LDLCALC 104 (H) 03/30/2023 1137   LDLCALC 96 09/06/2020 1138   LDLDIRECT 93.0 05/31/2020 0910     Clinical ASCVD: Yes   The 10-year ASCVD risk score (Arnett DK, et al., 2019) is: 23.2%   Values  used to calculate the score:     Age: 67 years     Sex: Male     Is Non-Hispanic African American: No     Diabetic: Yes     Tobacco smoker: No     Systolic Blood Pressure: 118 mmHg     Is BP treated: No     HDL Cholesterol: 42.2 mg/dL     Total Cholesterol: 171 mg/dL    Other: ([id number] if Afib, PHQ9 if depression, MMRC or CAT for COPD, ACT, DEXA)    BP Readings from Last 3 Encounters:  03/30/23 118/82  03/18/23 120/75  03/12/23 118/80      SDOH:  (Social Determinants of Health) assessments and interventions performed:    Allergies  Allergen Reactions   Niacin Anaphylaxis    Flushing - required ED visit    Medications Reviewed Today     Reviewed by Roxanne Gates, CMA (Certified Medical Assistant) on 03/30/23 at 1102  Med List Status: <None>   Medication Order Taking? Sig Documenting Provider Last Dose Status Informant  acetaminophen (TYLENOL) 650 MG CR tablet [id number]  Take 650 mg by mouth every 8 (eight) hours as needed for pain. [provider]  Active Self  aspirin EC 325 MG EC tablet [id number]  Take 1 tablet (325 mg total) by mouth daily. Renee Rival  Active Self  ELDERBERRY PO [id number]  Take 300 mg by mouth daily. [provider]  Active Self  empagliflozin (JARDIANCE) 10 MG TABS tablet [id number]  Take 1 tablet (10 mg total) by mouth daily. Donato Schultz, DO  Active   ezetimibe (ZETIA) 10 MG tablet [id number]  Take 1 tablet (10 mg total) by mouth daily. For cholesterol [name], Grayling Congress, DO  Active   famotidine (PEPCID) 20 MG tablet [id number]  Take 1 tablet (20 mg total) by mouth daily. [name], Grayling Congress, DO  Active            Med Note (CRUTHIS, CHLOE C   Wed Mar 18, 2023  9:13 AM)    fenofibrate 160 MG tablet [id number]  Take 1 tablet (160 mg total) by mouth daily. [name], Grayling Congress, DO  Active   gabapentin (NEURONTIN) 800 MG tablet [id number]  Take 1 tablet (800 mg total) by mouth 3 (three) times daily. [place], [place]  T, [place]  Active            Med Note (CRUTHIS, CHLOE C   Wed Mar 18, 2023  9:13 AM)    glucose blood [facility] VERIO) test strip [id number]  USE AS INSTRUCTED TO CHECK BLOOD SUGAR ONCE A DAY Shamleffer, Konrad Dolores, MD  Active Self  Magnesium Citrate 200 MG TABS [id number]  Take 400 mg by mouth daily. [provider]  Active Self  ondansetron (ZOFRAN) 4 MG tablet [id number]  Take 1 tablet (4 mg total) by mouth every  8 (eight) hours as needed. [name], Grayling Congress, DO  Active   pantoprazole (PROTONIX) 40 MG tablet [id number]  Take 1 tablet (40 mg total) by mouth 2 (two) times daily. Donato Schultz, DO  Active   sertraline (ZOLOFT) 100 MG tablet [id number]  Take 1.5 tablets (150 mg total) by mouth daily. [name], Grayling Congress, DO  Active   tiZANidine (ZANAFLEX) 4 MG tablet [id number]  Take 1 tablet (4 mg total) by mouth every 6 (six) hours as needed for muscle spasms. Donato Schultz, DO  Active   traZODone (DESYREL) 50 MG tablet [id number]  Take 1 tablet (50 mg total) by mouth at bedtime as needed for sleep. [name], Yvonne R, DO  Active   umeclidinium-vilanterol (ANORO ELLIPTA) 62.5-25 MCG/ACT AEPB [id number]  Inhale 1 puff into the lungs daily. Josephine Igo, DO  Active   vitamin E 180 MG (400 UNITS) capsule [id number]  Take 1,600 Units by mouth daily. [provider]  Active Self              Goals Addressed   None     Plan: Telephone follow up appointment with care management team member scheduled for:  1 to 2 months    Henrene Pastor, PharmD Clinical Pharmacist [facility]

## 2023-04-09 ENCOUNTER — Other Ambulatory Visit: Payer: Self-pay

## 2023-04-09 ENCOUNTER — Other Ambulatory Visit (HOSPITAL_BASED_OUTPATIENT_CLINIC_OR_DEPARTMENT_OTHER): Payer: Self-pay

## 2023-04-12 ENCOUNTER — Other Ambulatory Visit: Payer: Self-pay | Admitting: Internal Medicine

## 2023-04-13 ENCOUNTER — Ambulatory Visit (INDEPENDENT_AMBULATORY_CARE_PROVIDER_SITE_OTHER): Payer: Medicare HMO | Admitting: Family Medicine

## 2023-04-13 ENCOUNTER — Encounter: Payer: Self-pay | Admitting: *Deleted

## 2023-04-13 ENCOUNTER — Other Ambulatory Visit (HOSPITAL_BASED_OUTPATIENT_CLINIC_OR_DEPARTMENT_OTHER): Payer: Self-pay

## 2023-04-13 VITALS — BP 110/60 | Ht 71.0 in | Wt 200.0 lb

## 2023-04-13 DIAGNOSIS — M5412 Radiculopathy, cervical region: Secondary | ICD-10-CM | POA: Diagnosis not present

## 2023-04-13 MED ORDER — HYDROCODONE-ACETAMINOPHEN 5-325 MG PO TABS
1.0000 | ORAL_TABLET | Freq: Three times a day (TID) | ORAL | 0 refills | Status: DC | PRN
  Filled 2023-04-13: qty 15, 5d supply, fill #0

## 2023-04-13 NOTE — Assessment & Plan Note (Signed)
Acutely occurring.  Having pain on the left and right side today.  Pain more consistent with the cervical spine.  He has tried epidurals in the past with limited success.  He would like to consider surgery as treatment but his health has to be improved first. -Counseled on home exercise therapy and supportive care. -Norco. -Could consider another epidural

## 2023-04-13 NOTE — Progress Notes (Signed)
Ronald Petersen - 67 y.o. male MRN [id number]  Date of birth: 05-27-1956  SUBJECTIVE:  Including CC & ROS.  No chief complaint on file.   Ronald Petersen is a 67 y.o. male that is presenting with worsening of his left-sided shoulder pain.  This is occurring from the neck and radiating distally but he is also having pain on the right side.  He has experienced 2 cervical epidural injections with limited improvement.  He has been on pain medication previously.  He recently had chest pain and was admitted to the hospital for cardiovascular chest pain.  He is awaiting improvement of his health in order to have cervical spine surgery.    Review of Systems See HPI   HISTORY: Past Medical, Surgical, Social, and Family History Reviewed & Updated per EMR.   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 in 2021 inferring suboptimal Plavix platelet inhibition,   Right rotator cuff tear 11/23/2018   Sleep apnea    no CPAP    Past Surgical History:  Procedure Laterality Date   APPENDECTOMY     ARTHOSCOPIC ROTAOR CUFF REPAIR Right 11/23/2018   Procedure: ARTHROSCOPIC ROTATOR CUFF REPAIR;  Surgeon: Teryl Lucy, MD;  Location: [facility];  Service: Orthopedics;  Laterality: Right;   CHOLECYSTECTOMY     CORONARY ARTERY BYPASS GRAFT N/A 12/24/2021   x2 LIMA to LAD; SVG to Obtuse Marginal   CORONARY STENT INTERVENTION N/A 11/19/2020   Procedure: CORONARY STENT INTERVENTION;  Surgeon: Yvonne Kendall, MD;  Location: [facility] INVASIVE CV  LAB;  Service: Cardiovascular;  Laterality: N/A;   CORONARY ULTRASOUND/IVUS N/A 11/19/2020   Procedure: Intravascular Ultrasound/IVUS;  Surgeon: Yvonne Kendall, MD;  Location: [facility] INVASIVE CV LAB;  Service: Cardiovascular;  Laterality: N/A;   ENDOVEIN HARVEST OF GREATER SAPHENOUS VEIN Right 12/24/2021   Procedure: ENDOVEIN HARVEST OF GREATER SAPHENOUS VEIN;  Surgeon: Corliss Skains, MD;  Location: [facility] OR;  Service: Open Heart Surgery;  Laterality: Right;   IR THORACENTESIS ASP PLEURAL SPACE W/IMG GUIDE  01/31/2022   LEFT HEART CATH AND CORONARY ANGIOGRAPHY N/A 11/19/2020   Procedure: LEFT HEART CATH AND CORONARY ANGIOGRAPHY;  Surgeon: Yvonne Kendall, MD;  Location: [facility] INVASIVE CV LAB;  Service: Cardiovascular;  Laterality: N/A;   LEFT HEART CATH AND CORONARY ANGIOGRAPHY N/A 12/24/2020   Procedure: LEFT HEART CATH AND CORONARY ANGIOGRAPHY;  Surgeon: [place], Peter M, MD;  Location: [facility] INVASIVE CV LAB;  Service: Cardiovascular;  Laterality: N/A;   LEFT HEART CATH AND CORONARY ANGIOGRAPHY N/A 12/16/2021   Procedure: LEFT HEART CATH AND CORONARY ANGIOGRAPHY;  Surgeon: Marykay Lex, MD;  Location: [facility] INVASIVE CV LAB;  Service: Cardiovascular;  Laterality: N/A;   LEFT HEART CATH AND CORS/GRAFTS ANGIOGRAPHY N/A 03/18/2023   Procedure: LEFT HEART CATH AND CORS/GRAFTS ANGIOGRAPHY;  Surgeon: Corky Crafts, MD;  Location[facility] INVASIVE CV LAB;  Service: Cardiovascular;  Laterality: N/A;   LUMBAR LAMINECTOMY  SHOULDER ARTHROSCOPY WITH ROTATOR CUFF REPAIR AND SUBACROMIAL DECOMPRESSION Right 11/23/2018   Procedure: SHOULDER ARTHROSCOPY WITH ROTATOR CUFF REPAIR AND SUBACROMIAL DECOMPRESSION;  Surgeon: Teryl Lucy, MD;  Location: [facility];  Service: Orthopedics;  Laterality: Right;   TEE WITHOUT CARDIOVERSION N/A 12/24/2021   Procedure: TRANSESOPHAGEAL ECHOCARDIOGRAM (TEE);  Surgeon: Corliss Skains, MD;  Location: [facility] OR;  Service: Open Heart Surgery;  Laterality: N/A;      PHYSICAL EXAM:  VS: BP 110/60   Ht 5\' 11"  (1.803 m)   Wt 200 lb (90.7 kg)   BMI 27.89 kg/m  Physical Exam Gen: NAD, alert, cooperative with exam, well-appearing MSK:  Neurovascularly intact       ASSESSMENT & PLAN:   Cervical radiculopathy Acutely occurring.  Having pain on the left and right side today.  Pain more consistent with the cervical spine.  He has tried epidurals in the past with limited success.  He would like to consider surgery as treatment but his health has to be improved first. -Counseled on home exercise therapy and supportive care. -Norco. -Could consider another epidural

## 2023-04-13 NOTE — Patient Instructions (Signed)
Good to see you Please use the heat as needed  Please use the pain medicine as needed   Please send me a message in MyChart with any questions or updates.  Please see me back as needed.   --Dr. Jordan Likes

## 2023-04-14 ENCOUNTER — Encounter: Payer: Self-pay | Admitting: Cardiology

## 2023-04-14 ENCOUNTER — Other Ambulatory Visit: Payer: Self-pay

## 2023-04-14 ENCOUNTER — Ambulatory Visit: Payer: Medicare HMO | Attending: Cardiology | Admitting: Cardiology

## 2023-04-14 ENCOUNTER — Other Ambulatory Visit (HOSPITAL_BASED_OUTPATIENT_CLINIC_OR_DEPARTMENT_OTHER): Payer: Self-pay

## 2023-04-14 VITALS — BP 128/80 | HR 67 | Ht 71.0 in | Wt 204.0 lb

## 2023-04-14 DIAGNOSIS — Z9861 Coronary angioplasty status: Secondary | ICD-10-CM | POA: Diagnosis not present

## 2023-04-14 DIAGNOSIS — E782 Mixed hyperlipidemia: Secondary | ICD-10-CM

## 2023-04-14 DIAGNOSIS — I10 Essential (primary) hypertension: Secondary | ICD-10-CM

## 2023-04-14 DIAGNOSIS — I2510 Atherosclerotic heart disease of native coronary artery without angina pectoris: Secondary | ICD-10-CM | POA: Diagnosis not present

## 2023-04-14 NOTE — Patient Instructions (Signed)
Medication Instructions:  Your physician recommends that you continue on your current medications as directed. Please refer to the Current Medication list given to you today.  *If you need a refill on your cardiac medications before your next appointment, please call your pharmacy*   Lab Work: None   Testing/Procedures: None   Follow-Up: At [facility], you and your health needs are our priority.  As part of our continuing mission to provide you with exceptional heart care, we have created designated Provider Care Teams.  These Care Teams include your primary Cardiologist (physician) and Advanced Practice Providers (APPs -  Physician Assistants and Nurse Practitioners) who all work together to provide you with the care you need, when you need it.   Your next appointment:   9 month(s)  Provider:   Kardie Tobb, DO   

## 2023-04-15 NOTE — Progress Notes (Signed)
Cardiology Office Note:    Date:  04/18/2023   ID:  Ronald Petersen, DOB Apr 07, 1956, MRN [id number]  PCP:  [name], Grayling Congress, DO  Cardiologist:  Thomasene Ripple, DO  Electrophysiologist:  None   Referring MD: [name], Grayling Congress, *   " I am having some shortness of breath and intermittent chest pain"  History of Present Illness:    Ronald Petersen is a 67 y.o. 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     I saw the patient January 16, 2022 at that time he reported some shortness of breath I increase his Lasix to 40 mg daily.  Blood work.  I also gave him some lidocaine as he was experiencing muscle skeletal pain.   I saw the patient on February 18, 2022 at that time he was still short of breath I increase his Lasix to 40 mg twice a day.  He has titrated down the Lasix to every other day.  He is not sure if he is feeling any better on days that he takes the Lasix he is going to pay attention to his to see if there is any difference.  At his visit on May 14, 2022 he was experiencing midsternal chest discomfort and shortness of breath.  Got a plain x-ray which was normal because also concerned about musculoskeletal component of this.  He had never gotten a pulmonary function test I referred the patient to pulmonary.  I held off on increasing his Lasix.  In 07/2022, I increase his renaxa to 1000 mg twice a day. And referred him to pulmonary.   Since his visit was admitted to [facility] and underwent a left heart cath with indication for  intervention.  Today he offers no complaints.   Past Medical History:  Diagnosis Date   Anxiety    Arthritis    CAD (coronary artery disease)    Cancer    Complication of anesthesia    aspirated with back surgery at age 2   Depression    Diabetes mellitus Type II    GERD (gastroesophageal reflux disease)    Hyperlipidemia    Hypertension    Mild aortic stenosis    Mild carotid artery disease    Neuromuscular disorder    NEUROPATHY   Plavix resistance    abnormal cytochrome P450 2c19 genotype in 2021 inferring suboptimal Plavix platelet inhibition,   Right rotator cuff tear 11/23/2018   Sleep apnea    no CPAP    Past Surgical History:  Procedure Laterality Date   APPENDECTOMY     ARTHOSCOPIC ROTAOR CUFF REPAIR Right 11/23/2018   Procedure: ARTHROSCOPIC ROTATOR CUFF REPAIR;  Surgeon: Teryl Lucy, MD;  Location: [facility];  Service: Orthopedics;  Laterality: Right;   CHOLECYSTECTOMY     CORONARY ARTERY BYPASS GRAFT N/A 12/24/2021   x2 LIMA to LAD; SVG to Obtuse Marginal   CORONARY STENT INTERVENTION N/A 11/19/2020   Procedure: CORONARY STENT INTERVENTION;  Surgeon: Yvonne Kendall, MD;  Location:  [facility];  Service: Cardiovascular;  Laterality: N/A;   CORONARY ULTRASOUND/IVUS N/A 11/19/2020   Procedure: Intravascular Ultrasound/IVUS;  Surgeon: Yvonne Kendall, MD;  Location: [facility];  Service: Cardiovascular;  Laterality: N/A;   ENDOVEIN HARVEST OF GREATER SAPHENOUS VEIN Right 12/24/2021   Procedure: ENDOVEIN HARVEST OF GREATER SAPHENOUS VEIN;  Surgeon: Corliss Skains, MD;  Location: [facility] OR;  Service: Open Heart Surgery;  Laterality: Right;   IR THORACENTESIS ASP PLEURAL SPACE W/IMG GUIDE  01/31/2022   LEFT HEART CATH AND CORONARY ANGIOGRAPHY N/A 11/19/2020   Procedure: LEFT HEART CATH AND CORONARY ANGIOGRAPHY;  Surgeon: Yvonne Kendall, MD;  Location: [facility];  Service: Cardiovascular;  Laterality: N/A;   LEFT HEART  CATH AND CORONARY ANGIOGRAPHY N/A 12/24/2020   Procedure: LEFT HEART CATH AND CORONARY ANGIOGRAPHY;  Surgeon: Swaziland, Peter M, MD;  Location: [facility] CV LAB;  Service: Cardiovascular;  Laterality: N/A;   LEFT HEART CATH AND CORONARY ANGIOGRAPHY N/A 12/16/2021   Procedure: LEFT HEART CATH AND CORONARY ANGIOGRAPHY;  Surgeon: Marykay Lex, MD;  Location: [facility] INVASIVE CV LAB;  Service: Cardiovascular;  Laterality: N/A;   LEFT HEART CATH AND CORS/GRAFTS ANGIOGRAPHY N/A 03/18/2023   Procedure: LEFT HEART CATH AND CORS/GRAFTS ANGIOGRAPHY;  Surgeon: Corky Crafts, MD;  Location: [facility] INVASIVE CV LAB;  Service: Cardiovascular;  Laterality: N/A;   LUMBAR LAMINECTOMY     SHOULDER ARTHROSCOPY WITH ROTATOR CUFF REPAIR AND SUBACROMIAL DECOMPRESSION Right 11/23/2018   Procedure: SHOULDER ARTHROSCOPY WITH ROTATOR CUFF REPAIR AND SUBACROMIAL DECOMPRESSION;  Surgeon: Teryl Lucy, MD;  Location: [facility];  Service: Orthopedics;  Laterality: Right;   TEE WITHOUT CARDIOVERSION N/A 12/24/2021   Procedure: TRANSESOPHAGEAL ECHOCARDIOGRAM (TEE);  Surgeon: Corliss Skains, MD;  Location: [facility] OR;  Service: Open Heart Surgery;  Laterality: N/A;    Current Medications: Current Meds  Medication Sig   acetaminophen (TYLENOL) 650 MG CR tablet Take 650 mg by mouth every 8 (eight) hours as needed for pain.   aspirin EC 325 MG EC tablet Take 1 tablet (325 mg total) by mouth daily.   ELDERBERRY PO Take 300 mg by mouth daily.   empagliflozin (JARDIANCE) 10 MG TABS tablet Take by mouth daily before breakfast.   ezetimibe (ZETIA) 10 MG tablet Take 1 tablet (10 mg total) by mouth daily. For cholesterol   famotidine (PEPCID) 20 MG tablet Take 1 tablet (20 mg total) by mouth daily.   fenofibrate 160 MG tablet Take 1 tablet (160 mg total) by mouth daily.   gabapentin (NEURONTIN) 800 MG tablet Take 1 tablet (800 mg total) by mouth 3 (three) times daily.   glucose blood (ONETOUCH VERIO) test strip USE  AS INSTRUCTED TO CHECK BLOOD SUGAR ONCE A DAY   HYDROcodone-acetaminophen (NORCO/VICODIN) 5-325 MG tablet Take 1 tablet by mouth every 8 (eight) hours as needed.   Magnesium Citrate 200 MG TABS Take 400 mg by mouth daily.   ondansetron (ZOFRAN) 4 MG tablet Take 1 tablet (4 mg total) by mouth every 8 (eight) hours as needed.   pantoprazole (PROTONIX) 40 MG tablet Take 1 tablet (40 mg total) by mouth 2 (two) times daily.   sertraline (ZOLOFT) 100 MG tablet Take 1.5 tablets (150 mg total) by mouth daily.   tiZANidine (ZANAFLEX) 4 MG tablet Take 1 tablet (4 mg total) by mouth every 6 (six) hours as needed for muscle spasms.   traZODone (DESYREL) 50 MG tablet Take 1 tablet (50 mg total) by mouth at bedtime  as needed for sleep.   umeclidinium-vilanterol (ANORO ELLIPTA) 62.5-25 MCG/ACT AEPB Inhale 1 puff into the lungs daily.   vitamin E 180 MG (400 UNITS) capsule Take 1,600 Units by mouth daily.     Allergies:   Niacin   Social History   Socioeconomic History   Marital status: Married    Spouse name: Erie Noe   Number of children: 2   Years of education: Not on file   Highest education level: Not on file  Occupational History   Occupation: self employed    Associate Professor: UNEMPLOYED  Tobacco Use   Smoking status: Former    Years: 16    Types: Cigarettes    Quit date: 12/29/1985    Years since quitting: 37.3   Smokeless tobacco: Never  Vaping Use   Vaping Use: Never used  Substance and Sexual Activity   Alcohol use: Not Currently   Drug use: No   Sexual activity: Yes    Partners: Female  Other Topics Concern   Not on file  Social History Narrative   Exercise-- 3 days   Pt has hs degree   Right handed   One story home   Drinks caffeine 2 cups in am   Are you right handed or left handed?    Are you currently employed ?    What is your current occupation? retired   Do you live at home alone?   Who lives with you?  wife   What type of home do you live in: 1 story or 2 story?         Social Determinants of Health   Financial Resource Strain: Medium Risk (01/19/2023)   Overall Financial Resource Strain (CARDIA)    Difficulty of Paying Living Expenses: Somewhat hard  Food Insecurity: Food Insecurity Present (03/17/2023)   Hunger Vital Sign    Worried About Running Out of Food in the Last Year: Never true    Ran Out of Food in the Last Year: Often true  Transportation Needs: No Transportation Needs (03/17/2023)   PRAPARE - Administrator, Civil Service (Medical): No    Lack of Transportation (Non-Medical): No  Physical Activity: Sufficiently Active (02/04/2022)   Exercise Vital Sign    Days of Exercise per Week: 7 days    Minutes of Exercise per Session: 30 min  Stress: Stress Concern Present (01/19/2023)   [employer] of Occupational Health - Occupational Stress Questionnaire    Feeling of Stress : To some extent  Social Connections: Moderately Integrated (01/27/2023)   Social Connection and Isolation Panel [NHANES]    Frequency of Communication with Friends and Family: More than three times a week    Frequency of Social Gatherings with Friends and Family: More than three times a week    Attends Religious Services: More than 4 times per year    Active Member of [employer] or Organizations: No    Attends Engineer, structural: Not on file    Marital Status: Married     Family History: The patient's family history includes Alcohol abuse in an other family member; Arthritis in an other family member; COPD in his mother; Coronary artery disease in an other family member; Depression in an other family member; Heart disease in his sister and sister; Hypertension in an other family member; Ovarian cancer in his sister and other family members; Stomach cancer in his maternal grandmother and sister; Stroke in his father. There is no history of Colon polyps or Colon cancer.  ROS:   Review of Systems  Constitution: Negative for decreased appetite, fever and  weight gain.  HENT: Negative for congestion, ear discharge, hoarse voice and sore throat.   Eyes: Negative for discharge, redness, vision loss in right eye and visual halos.  Cardiovascular: Negative for chest pain, dyspnea on exertion, leg swelling, orthopnea and palpitations.  Respiratory: Negative for cough, hemoptysis, shortness of breath and snoring.   Endocrine: Negative for heat intolerance and polyphagia.  Hematologic/Lymphatic: Negative for bleeding problem. Does not bruise/bleed easily.  Skin: Negative for flushing, nail changes, rash and suspicious lesions.  Musculoskeletal: Negative for arthritis, joint pain, muscle cramps, myalgias, neck pain and stiffness.  Gastrointestinal: Negative for abdominal pain, bowel incontinence, diarrhea and excessive appetite.  Genitourinary: Negative for decreased libido, genital sores and incomplete emptying.  Neurological: Negative for brief paralysis, focal weakness, headaches and loss of balance.  Psychiatric/Behavioral: Negative for altered mental status, depression and suicidal ideas.  Allergic/Immunologic: Negative for HIV exposure and persistent infections.    EKGs/Labs/Other Studies Reviewed:    The following studies were reviewed today:   EKG:  None today   LHC 03/18/2023 Mid LM to Ost LAD lesion is 40% stenosed with 40% stenosed side branch in Ost Cx.  SVG to OM is widely patent.   Ost LAD to Prox LAD lesion is 95% stenosed.  LIMA to LAD is widely patent.   1st Diag lesion is 25% stenosed.   RPAV lesion is 50% stenosed.  Small vessel.   Non-stenotic Prox LAD to Mid LAD lesion was previously treated.   Prior circumflex stent is widely patent.   Prior OM1 stent is patent.   The left ventricular systolic function is normal.   LV end diastolic pressure is normal.  LVEDP 8 mm Hg.   The left ventricular ejection fraction is 55-65% by visual estimate.   There is no aortic valve stenosis.   Continue aggressive secondary  prevention.  Recent Labs: 05/14/2022: Magnesium 2.1 03/17/2023: B Natriuretic Peptide 45.2 03/18/2023: TSH 1.971 03/30/2023: ALT 18; BUN 18; Creatinine, Ser 0.90; Hemoglobin 15.9; Platelets 174.0; Potassium 4.6; Sodium 139  Recent Lipid Panel    Component Value Date/Time   CHOL 171 03/30/2023 1137   TRIG 128.0 03/30/2023 1137   TRIG 148 12/10/2006 1603   HDL 42.20 03/30/2023 1137   CHOLHDL 4 03/30/2023 1137   VLDL 25.6 03/30/2023 1137   LDLCALC 104 (H) 03/30/2023 1137   LDLCALC 96 09/06/2020 1138   LDLDIRECT 93.0 05/31/2020 0910    Physical Exam:    VS:  BP 128/80   Pulse 67   Ht 5\' 11"  (1.803 m)   Wt 204 lb (92.5 kg)   SpO2 95%   BMI 28.45 kg/m     Wt Readings from Last 3 Encounters:  04/14/23 204 lb (92.5 kg)  04/13/23 200 lb (90.7 kg)  03/30/23 201 lb 3.2 oz (91.3 kg)     GEN: Well nourished, well developed in no acute distress HEENT: Normal NECK: No JVD; No carotid bruits LYMPHATICS: No lymphadenopathy CARDIAC: S1S2 noted,RRR, no murmurs, rubs, gallops RESPIRATORY:  Clear to auscultation without rales, wheezing or rhonchi  ABDOMEN: Soft, non-tender, non-distended, +bowel sounds, no guarding. EXTREMITIES: No edema, No cyanosis, no clubbing MUSCULOSKELETAL:  No deformity  SKIN: Warm and dry NEUROLOGIC:  Alert and oriented x 3, non-focal PSYCHIATRIC:  Normal affect, good insight  ASSESSMENT:    1. CAD S/P percutaneous coronary angioplasty   2. Essential hypertension   3. Mixed hyperlipidemia     PLAN:  He appears to be doing well from a CV standpoint. No changes in his medication .  Blood pressure is acceptable, continue with current antihypertensive regimen.  Hyperlipidemia - continue with current statin medication.  The patient is in agreement with the above plan. The patient left the office in stable condition.  The patient will follow up in 6 months or sooner if needed.   Medication Adjustments/Labs and Tests Ordered: Current medicines are  reviewed at length with the patient today.  Concerns regarding medicines are outlined above.  No orders of the defined types were placed in this encounter.  No orders of the defined types were placed in this encounter.   Patient Instructions  Medication Instructions:  Your physician recommends that you continue on your current medications as directed. Please refer to the Current Medication list given to you today.  *If you need a refill on your cardiac medications before your next appointment, please call your pharmacy*   Lab Work: None   Testing/Procedures: None   Follow-Up: At [facility], you and your health needs are our priority.  As part of our continuing mission to provide you with exceptional heart care, we have created designated Provider Care Teams.  These Care Teams include your primary Cardiologist (physician) and Advanced Practice Providers (APPs -  Physician Assistants and Nurse Practitioners) who all work together to provide you with the care you need, when you need it.  Your next appointment:   9 month(s)  Provider:   Thomasene Ripple, DO     Adopting a Healthy Lifestyle.  Know what a healthy weight is for you (roughly BMI <25) and aim to maintain this   Aim for 7+ servings of fruits and vegetables daily   65-80+ fluid ounces of water or unsweet tea for healthy kidneys   Limit to max 1 drink of alcohol per day; avoid smoking/tobacco   Limit animal fats in diet for cholesterol and heart health - choose grass fed whenever available   Avoid highly processed foods, and foods high in saturated/trans fats   Aim for low stress - take time to unwind and care for your mental health   Aim for 150 min of moderate intensity exercise weekly for heart health, and weights twice weekly for bone health   Aim for 7-9 hours of sleep daily   When it comes to diets, agreement about the perfect plan isnt easy to find, even among the experts. Experts at the [facility] [employer] developed an idea known as the Healthy Eating Plate. Just imagine a plate divided into logical, healthy portions.   The emphasis is on diet quality:   Load up on vegetables and fruits - one-half of your plate: Aim for color and variety, and remember that potatoes dont count.   Go for whole grains - one-quarter of your plate: Whole wheat, barley, wheat berries, quinoa, oats, brown rice, and foods made with them. If you want pasta, go with whole wheat pasta.   Protein power - one-quarter of your plate: Fish, chicken, beans, and nuts are all healthy, versatile protein sources. Limit red meat.   The diet, however, does go beyond the plate, offering a few other suggestions.   Use healthy plant oils, such as olive, canola, soy, corn, sunflower and peanut. Check the labels, and avoid partially hydrogenated oil, which have unhealthy trans fats.   If youre thirsty, drink water. Coffee and tea are good in moderation, but skip sugary drinks and limit milk and  dairy products to one or two daily servings.   The type of carbohydrate in the diet is more important than the amount. Some sources of carbohydrates, such as vegetables, fruits, whole grains, and beans-are healthier than others.   Finally, stay active  Osvaldo Shipper, DO  04/18/2023 10:51 PM    [facility]

## 2023-04-16 ENCOUNTER — Telehealth: Payer: Self-pay | Admitting: Family Medicine

## 2023-04-16 NOTE — Telephone Encounter (Signed)
Contacted Haston D Dittrich to schedule their annual wellness visit. Appointment made for 04/29/2023.  Verlee Rossetti; Care Guide Ambulatory Clinical Support [place] [facility] Direct Dial: [phone]

## 2023-04-18 ENCOUNTER — Other Ambulatory Visit: Payer: Self-pay | Admitting: Family Medicine

## 2023-04-18 ENCOUNTER — Other Ambulatory Visit: Payer: Self-pay | Admitting: Internal Medicine

## 2023-04-18 DIAGNOSIS — G8929 Other chronic pain: Secondary | ICD-10-CM

## 2023-04-20 ENCOUNTER — Other Ambulatory Visit (HOSPITAL_BASED_OUTPATIENT_CLINIC_OR_DEPARTMENT_OTHER): Payer: Self-pay

## 2023-04-20 ENCOUNTER — Other Ambulatory Visit: Payer: Self-pay

## 2023-04-20 MED ORDER — TIZANIDINE HCL 4 MG PO TABS
4.0000 mg | ORAL_TABLET | Freq: Four times a day (QID) | ORAL | 1 refills | Status: DC | PRN
  Filled 2023-04-20: qty 30, 8d supply, fill #0
  Filled 2023-06-01: qty 30, 8d supply, fill #1

## 2023-04-21 ENCOUNTER — Other Ambulatory Visit (HOSPITAL_BASED_OUTPATIENT_CLINIC_OR_DEPARTMENT_OTHER): Payer: Self-pay

## 2023-04-22 ENCOUNTER — Other Ambulatory Visit: Payer: Self-pay | Admitting: Family Medicine

## 2023-04-22 ENCOUNTER — Other Ambulatory Visit: Payer: Self-pay | Admitting: Internal Medicine

## 2023-04-22 ENCOUNTER — Other Ambulatory Visit (HOSPITAL_BASED_OUTPATIENT_CLINIC_OR_DEPARTMENT_OTHER): Payer: Self-pay

## 2023-04-22 MED ORDER — HYDROCODONE-ACETAMINOPHEN 5-325 MG PO TABS
1.0000 | ORAL_TABLET | Freq: Three times a day (TID) | ORAL | 0 refills | Status: DC | PRN
  Filled 2023-04-22: qty 15, 5d supply, fill #0

## 2023-04-23 ENCOUNTER — Other Ambulatory Visit (HOSPITAL_BASED_OUTPATIENT_CLINIC_OR_DEPARTMENT_OTHER): Payer: Self-pay

## 2023-04-23 MED ORDER — ONETOUCH VERIO VI STRP
ORAL_STRIP | 12 refills | Status: DC
  Filled 2023-04-23: qty 100, 90d supply, fill #0
  Filled 2023-07-23: qty 100, 90d supply, fill #1
  Filled 2023-09-11 – 2023-10-19 (×2): qty 100, 90d supply, fill #2
  Filled 2024-01-19: qty 100, 100d supply, fill #3

## 2023-04-24 ENCOUNTER — Other Ambulatory Visit (HOSPITAL_COMMUNITY): Payer: Self-pay

## 2023-04-24 ENCOUNTER — Other Ambulatory Visit (HOSPITAL_BASED_OUTPATIENT_CLINIC_OR_DEPARTMENT_OTHER): Payer: Self-pay

## 2023-04-24 MED ORDER — TRAZODONE HCL 50 MG PO TABS
50.0000 mg | ORAL_TABLET | Freq: Every evening | ORAL | 0 refills | Status: DC | PRN
  Filled 2023-04-24 – 2023-06-01 (×2): qty 90, 90d supply, fill #0

## 2023-04-24 MED ORDER — EMPAGLIFLOZIN 25 MG PO TABS
25.0000 mg | ORAL_TABLET | Freq: Every day | ORAL | 3 refills | Status: DC
  Filled 2023-04-24: qty 90, 90d supply, fill #0
  Filled 2023-04-27: qty 30, 30d supply, fill #0

## 2023-04-24 MED ORDER — PANTOPRAZOLE SODIUM 40 MG PO TBEC
40.0000 mg | DELAYED_RELEASE_TABLET | Freq: Two times a day (BID) | ORAL | 0 refills | Status: DC
  Filled 2023-04-24 (×2): qty 180, 90d supply, fill #0

## 2023-04-24 NOTE — Patient Instructions (Signed)
Mr. Girtman It was a pleasure speaking with you today.  Below is a summary of our recent visit.   I have sent prescriptions for pantoprazole and trazodone you [facility] which handles the [employer] order prescription. Please call them to coordinate delivery and payment.   Remember to take Jandiance 10mg  - 2 tablet s= 20mg  daily   You should receive a letter in about 3 weeks from the Social Security Administration regarding either approval or denial of Extra Help. Please call me when you receive this letter. If you are approved then your medication copays should decrease. If you are not approved, then we will need a copy of the letter to apply for the Jardiance medication assistance program.    As always if you have any questions or concerns especially regarding medications, please feel free to contact me either at the phone number below or with a MyChart message.   Keep up the good work!  Henrene Pastor, PharmD Clinical Pharmacist [facility] [phone] (direct line)  [phone] (main office number)   Patient verbalizes understanding of instructions and care plan provided today and agrees to view in MyChart. Active MyChart status and patient understanding of how to access instructions and care plan via MyChart confirmed with patient.

## 2023-04-27 ENCOUNTER — Other Ambulatory Visit: Payer: Self-pay

## 2023-04-27 ENCOUNTER — Other Ambulatory Visit (HOSPITAL_BASED_OUTPATIENT_CLINIC_OR_DEPARTMENT_OTHER): Payer: Self-pay

## 2023-04-28 DIAGNOSIS — E1169 Type 2 diabetes mellitus with other specified complication: Secondary | ICD-10-CM

## 2023-04-28 DIAGNOSIS — E785 Hyperlipidemia, unspecified: Secondary | ICD-10-CM

## 2023-04-28 DIAGNOSIS — I2510 Atherosclerotic heart disease of native coronary artery without angina pectoris: Secondary | ICD-10-CM

## 2023-04-29 ENCOUNTER — Ambulatory Visit (INDEPENDENT_AMBULATORY_CARE_PROVIDER_SITE_OTHER): Payer: Medicare HMO | Admitting: *Deleted

## 2023-04-29 DIAGNOSIS — Z0000 Encounter for general adult medical examination without abnormal findings: Secondary | ICD-10-CM | POA: Diagnosis not present

## 2023-04-29 NOTE — Patient Instructions (Signed)
Ronald Petersen , Thank you for taking time to come for your Medicare Wellness Visit. I appreciate your ongoing commitment to your health goals. Please review the following plan we discussed and let me know if I can assist you in the future.     This is a list of the screening recommended for you and due dates:  Health Maintenance  Topic Date Due   COVID-19 Vaccine (1) Never done   Zoster (Shingles) Vaccine (1 of 2) Never done   Pneumonia Vaccine (1 of 2 - PCV) 06/04/2023*   Complete foot exam   06/04/2023   Flu Shot  07/30/2023   Hemoglobin A1C  09/29/2023   Eye exam for diabetics  02/07/2024   Yearly kidney function blood test for diabetes  03/29/2024   Yearly kidney health urinalysis for diabetes  03/29/2024   Medicare Annual Wellness Visit  04/28/2024   DTaP/Tdap/Td vaccine (3 - Td or Tdap) 07/31/2024   Colon Cancer Screening  07/19/2031   Hepatitis C Screening: USPSTF Recommendation to screen - Ages 74-79 yo.  Completed   HPV Vaccine  Aged Out  *Topic was postponed. The date shown is not the original due date.    Next appointment: Follow up in one year for your annual wellness visit.   Preventive Care 57 Years and Older, Male Preventive care refers to lifestyle choices and visits with your health care provider that can promote health and wellness. What does preventive care include? A yearly physical exam. This is also called an annual well check. Dental exams once or twice a year. Routine eye exams. Ask your health care provider how often you should have your eyes checked. Personal lifestyle choices, including: Daily care of your teeth and gums. Regular physical activity. Eating a healthy diet. Avoiding tobacco and drug use. Limiting alcohol use. Practicing safe sex. Taking low doses of aspirin every day. Taking vitamin and mineral supplements as recommended by your health care provider. What happens during an annual well check? The services and screenings done by your  health care provider during your annual well check will depend on your age, overall health, lifestyle risk factors, and family history of disease. Counseling  Your health care provider may ask you questions about your: Alcohol use. Tobacco use. Drug use. Emotional well-being. Home and relationship well-being. Sexual activity. Eating habits. History of falls. Memory and ability to understand (cognition). Work and work Astronomer. Screening  You may have the following tests or measurements: Height, weight, and BMI. Blood pressure. Lipid and cholesterol levels. These may be checked every 5 years, or more frequently if you are over 19 years old. Skin check. Lung cancer screening. You may have this screening every year starting at age 75 if you have a 30-pack-year history of smoking and currently smoke or have quit within the past 15 years. Fecal occult blood test (FOBT) of the stool. You may have this test every year starting at age 25. Flexible sigmoidoscopy or colonoscopy. You may have a sigmoidoscopy every 5 years or a colonoscopy every 10 years starting at age 14. Prostate cancer screening. Recommendations will vary depending on your family history and other risks. Hepatitis C blood test. Hepatitis B blood test. Sexually transmitted disease (STD) testing. Diabetes screening. This is done by checking your blood sugar (glucose) after you have not eaten for a while (fasting). You may have this done every 1-3 years. Abdominal aortic aneurysm (AAA) screening. You may need this if you are a current or former smoker. Osteoporosis. You  may be screened starting at age 26 if you are at high risk. Talk with your health care provider about your test results, treatment options, and if necessary, the need for more tests. Vaccines  Your health care provider may recommend certain vaccines, such as: Influenza vaccine. This is recommended every year. Tetanus, diphtheria, and acellular pertussis  (Tdap, Td) vaccine. You may need a Td booster every 10 years. Zoster vaccine. You may need this after age 72. Pneumococcal 13-valent conjugate (PCV13) vaccine. One dose is recommended after age 90. Pneumococcal polysaccharide (PPSV23) vaccine. One dose is recommended after age 75. Talk to your health care provider about which screenings and vaccines you need and how often you need them. This information is not intended to replace advice given to you by your health care provider. Make sure you discuss any questions you have with your health care provider. Document Released: 01/11/2016 Document Revised: 09/03/2016 Document Reviewed: 10/16/2015 Elsevier Interactive Patient Education  2017 [place] Prevention in the Home Falls can cause injuries. They can happen to people of all ages. There are many things you can do to make your home safe and to help prevent falls. What can I do on the outside of my home? Regularly fix the edges of walkways and driveways and fix any cracks. Remove anything that might make you trip as you walk through a door, such as a raised step or threshold. Trim any bushes or trees on the path to your home. Use bright outdoor lighting. Clear any walking paths of anything that might make someone trip, such as rocks or tools. Regularly check to see if handrails are loose or broken. Make sure that both sides of any steps have handrails. Any raised decks and porches should have guardrails on the edges. Have any leaves, snow, or ice cleared regularly. Use sand or salt on walking paths during winter. Clean up any spills in your garage right away. This includes oil or grease spills. What can I do in the bathroom? Use night lights. Install grab bars by the toilet and in the tub and shower. Do not use towel bars as grab bars. Use non-skid mats or decals in the tub or shower. If you need to sit down in the shower, use a plastic, non-slip stool. Keep the floor dry. Clean  up any water that spills on the floor as soon as it happens. Remove soap buildup in the tub or shower regularly. Attach bath mats securely with double-sided non-slip rug tape. Do not have throw rugs and other things on the floor that can make you trip. What can I do in the bedroom? Use night lights. Make sure that you have a light by your bed that is easy to reach. Do not use any sheets or blankets that are too big for your bed. They should not hang down onto the floor. Have a firm chair that has side arms. You can use this for support while you get dressed. Do not have throw rugs and other things on the floor that can make you trip. What can I do in the kitchen? Clean up any spills right away. Avoid walking on wet floors. Keep items that you use a lot in easy-to-reach places. If you need to reach something above you, use a strong step stool that has a grab bar. Keep electrical cords out of the way. Do not use floor polish or wax that makes floors slippery. If you must use wax, use non-skid floor wax. Do  not have throw rugs and other things on the floor that can make you trip. What can I do with my stairs? Do not leave any items on the stairs. Make sure that there are handrails on both sides of the stairs and use them. Fix handrails that are broken or loose. Make sure that handrails are as long as the stairways. Check any carpeting to make sure that it is firmly attached to the stairs. Fix any carpet that is loose or worn. Avoid having throw rugs at the top or bottom of the stairs. If you do have throw rugs, attach them to the floor with carpet tape. Make sure that you have a light switch at the top of the stairs and the bottom of the stairs. If you do not have them, ask someone to add them for you. What else can I do to help prevent falls? Wear shoes that: Do not have high heels. Have rubber bottoms. Are comfortable and fit you well. Are closed at the toe. Do not wear sandals. If you  use a stepladder: Make sure that it is fully opened. Do not climb a closed stepladder. Make sure that both sides of the stepladder are locked into place. Ask someone to hold it for you, if possible. Clearly mark and make sure that you can see: Any grab bars or handrails. First and last steps. Where the edge of each step is. Use tools that help you move around (mobility aids) if they are needed. These include: Canes. Walkers. Scooters. Crutches. Turn on the lights when you go into a dark area. Replace any light bulbs as soon as they burn out. Set up your furniture so you have a clear path. Avoid moving your furniture around. If any of your floors are uneven, fix them. If there are any pets around you, be aware of where they are. Review your medicines with your doctor. Some medicines can make you feel dizzy. This can increase your chance of falling. Ask your doctor what other things that you can do to help prevent falls. This information is not intended to replace advice given to you by your health care provider. Make sure you discuss any questions you have with your health care provider. Document Released: 10/11/2009 Document Revised: 05/22/2016 Document Reviewed: 01/19/2015 Elsevier Interactive Patient Education  2017 [employer].
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
## 2023-05-06 ENCOUNTER — Other Ambulatory Visit: Payer: Self-pay | Admitting: Family Medicine

## 2023-05-06 ENCOUNTER — Other Ambulatory Visit (HOSPITAL_BASED_OUTPATIENT_CLINIC_OR_DEPARTMENT_OTHER): Payer: Self-pay

## 2023-05-06 ENCOUNTER — Other Ambulatory Visit: Payer: Self-pay

## 2023-05-06 MED ORDER — HYDROCODONE-ACETAMINOPHEN 5-325 MG PO TABS
1.0000 | ORAL_TABLET | Freq: Three times a day (TID) | ORAL | 0 refills | Status: DC | PRN
  Filled 2023-05-06: qty 15, 5d supply, fill #0

## 2023-05-11 ENCOUNTER — Ambulatory Visit (INDEPENDENT_AMBULATORY_CARE_PROVIDER_SITE_OTHER): Payer: Medicare HMO | Admitting: Pharmacist

## 2023-05-11 ENCOUNTER — Other Ambulatory Visit (HOSPITAL_BASED_OUTPATIENT_CLINIC_OR_DEPARTMENT_OTHER): Payer: Self-pay

## 2023-05-11 ENCOUNTER — Other Ambulatory Visit (HOSPITAL_COMMUNITY): Payer: Self-pay

## 2023-05-11 DIAGNOSIS — I25119 Atherosclerotic heart disease of native coronary artery with unspecified angina pectoris: Secondary | ICD-10-CM

## 2023-05-11 DIAGNOSIS — I10 Essential (primary) hypertension: Secondary | ICD-10-CM

## 2023-05-11 DIAGNOSIS — E785 Hyperlipidemia, unspecified: Secondary | ICD-10-CM

## 2023-05-11 DIAGNOSIS — E1165 Type 2 diabetes mellitus with hyperglycemia: Secondary | ICD-10-CM

## 2023-05-11 MED ORDER — EMPAGLIFLOZIN 25 MG PO TABS
25.0000 mg | ORAL_TABLET | Freq: Every day | ORAL | 3 refills | Status: DC
  Filled 2023-05-11 – 2023-05-14 (×2): qty 90, 90d supply, fill #0
  Filled 2023-08-10: qty 90, 90d supply, fill #1
  Filled 2023-11-08 – 2023-12-01 (×3): qty 90, 90d supply, fill #2
  Filled 2024-02-25: qty 90, 90d supply, fill #3

## 2023-05-11 NOTE — Progress Notes (Signed)
Chronic Care Management Pharmacy Note  05/11/2023 Name:  Ronald Petersen MRN:  [id number] DOB:  1956/03/28  Summary:  CAD / Hyperlipidemia: Not at LDL goal of < 55 History of CABG x 2 with stent; father had stroke Current therapy - fenofibrate 160mg  daily and ezetimibe 10mg  daily  Past therapy - rosuvastatin 40mg  daily (patient declined) pravastatin 40mg  - stopped 2021; rosuvastatin 20mg  - stopped 2021. Niacin - stopped 2007 due to severe flushing requiring ED visit.   Type 2 DM:  Last A1c at goal.  Current therapy - Jardiance 10mg  daily. Dose was increased to 25mg  by Dr Shaune Pollack on 03/30/2023 but per patient the cost increased to $47 with dose increase (he states the 10mg  was on $11 - checked with pharmacy and he did pay $11.20 last time but cost now is up to $182 (90 days). Suspect has has had LIS in past but not currently. We applied for LIS at last visit but patient states he has not received letter from social security office yet.   Past therapy - metformin - stopped because patient was concerned about potential side effects.   Reports he checks blood glucose daily. HBG usually 120 to 140.  He feels bad when blood glucose is < 100 or > 170 but this does not happen often  Blood pressure:  BP Readings from Last 3 Encounters:  04/14/23 128/80  04/13/23 110/60  03/30/23 118/82   Anxiety / OCD:  Current therapy - sertraline 100mg  -  tablet daily  Patient reports good control with current therapy  Medication Management:  Reviewed med list and updated  Reviewed refill history.  Patient reports no issues with medication costs. He is getting Extra Help thru Medicare with med costs. Had only family planning plan with Medicaid.   Recommendations/Changes made from today's visit: Assisted patient in applying for [employer] grant for Type 2 DM - patient was approved for $1000 for 1 year 05/11/2023 thru 05/28/2024.  He will let me know when he received letter from Social security  about LIS decision. Continue ezetimibe 10mg  daily and fenofibrate 160mg  daily. Patient declined statin therapy.  We could try Nexlizet for elevated cholesterol if he is approved for LIS or we could apply for [employer] grant in future.   Start Jardiance 25mg  daily   Discussed blood glucose goals.  Fasting blood glucose goal (before meals) = 80 to 130 Blood glucose goal after a meal = less than 180   Patient is interested in mail order option thru [employer]. Provided phone for [employer] for mail order to patient again. Reminded that he has to call to verify address and coordinate delivery and payment.   Subjective: Ronald Petersen is an 67 y.o. year old male who is a primary patient of Donato Schultz, DO.  The patient was referred to the Chronic Care Management team for assistance with care management needs subsequent to provider initiation of CCM services and plan of care.    Engaged with patient by telephone for follow up visit in response to provider referral for CCM services.   Objective:  LABS:    Lab Results  Component Value Date   CREATININE 0.90 03/30/2023   CREATININE 0.90 03/17/2023   CREATININE 0.95 12/09/2022     Lab Results  Component Value Date   HGBA1C 7.2 (H) 03/30/2023         Component Value Date/Time   CHOL 171 03/30/2023 1137   TRIG 128.0 03/30/2023 1137  TRIG 148 12/10/2006 1603   HDL 42.20 03/30/2023 1137   CHOLHDL 4 03/30/2023 1137   VLDL 25.6 03/30/2023 1137   LDLCALC 104 (H) 03/30/2023 1137   LDLCALC 96 09/06/2020 1138   LDLDIRECT 93.0 05/31/2020 0910     Clinical ASCVD: Yes   The 10-year ASCVD risk score (Arnett DK, et al., 2019) is: 26.3%   Values used to calculate the score:     Age: 53 years     Sex: Male     Is Non-Hispanic African American: No     Diabetic: Yes     Tobacco smoker: No     Systolic Blood Pressure: 128 mmHg     Is BP treated: No     HDL Cholesterol: 42.2 mg/dL     Total Cholesterol:  171 mg/dL    Other: ([id number] if Afib, PHQ9 if depression, MMRC or CAT for COPD, ACT, DEXA)    BP Readings from Last 3 Encounters:  04/14/23 128/80  04/13/23 110/60  03/30/23 118/82      SDOH:  (Social Determinants of Health) assessments and interventions performed:    Allergies  Allergen Reactions   Niacin Anaphylaxis    Flushing - required ED visit    Medications Reviewed Today     Reviewed by Juel Burrow, CMA (Certified Medical Assistant) on 04/29/23 at 1453  Med List Status: <None>   Medication Order Taking? Sig Documenting Provider Last Dose Status Informant  acetaminophen (TYLENOL) 650 MG CR tablet [id number] No Take 650 mg by mouth every 8 (eight) hours as needed for pain. [provider] Taking Active Self  aspirin EC 325 MG EC tablet [id number] No Take 1 tablet (325 mg total) by mouth daily. Renee Rival Taking Active Self  ELDERBERRY PO [id number] No Take 300 mg by mouth daily. [provider] Taking Active Self  empagliflozin (JARDIANCE) 25 MG TABS tablet [id number]  Take 1 tablet (25 mg total) by mouth daily before breakfast. [name], Grayling Congress, DO  Active   ezetimibe (ZETIA) 10 MG tablet [id number] No Take 1 tablet (10 mg total) by mouth daily. For cholesterol Donato Schultz, DO Taking Active   famotidine (PEPCID) 20 MG tablet [id number] No Take 1 tablet (20 mg total) by mouth daily. Donato Schultz, DO Taking Active            Med Note (CRUTHIS, CHLOE C   Wed Mar 18, 2023  9:13 AM)    fenofibrate 160 MG tablet [id number] No Take 1 tablet (160 mg total) by mouth daily. Donato Schultz, DO Taking Active   gabapentin (NEURONTIN) 800 MG tablet [id number] No Take 1 tablet (800 mg total) by mouth 3 (three) times daily. [place], [place] T, [place] Taking Active            Med Note (CRUTHIS, CHLOE C   Wed Mar 18, 2023  9:13 AM)    glucose blood [facility] VERIO) test strip [id number]  USE AS INSTRUCTED TO CHECK BLOOD SUGAR ONCE A DAY [name], Grayling Congress, DO  Active   HYDROcodone-acetaminophen (NORCO/VICODIN) 5-325 MG tablet [id number]  Take 1 tablet by mouth every 8 (eight) hours as needed. Myra Rude, MD  Active   Magnesium Citrate 200 MG TABS [id number] No Take 400 mg by mouth daily. [provider] Taking Active Self  ondansetron (ZOFRAN) 4 MG tablet [id number] No Take 1 tablet (4 mg total) by mouth every 8 (eight) hours as needed. [name], Myrene Buddy  R, DO Taking Active   pantoprazole (PROTONIX) 40 MG tablet [id number]  Take 1 tablet (40 mg total) by mouth 2 (two) times daily. Donato Schultz, DO  Active   sertraline (ZOLOFT) 100 MG tablet [id number] No Take 1.5 tablets (150 mg total) by mouth daily. Donato Schultz, DO Taking Active   tiZANidine (ZANAFLEX) 4 MG tablet [id number]  Take 1 tablet (4 mg total) by mouth every 6 (six) hours as needed for muscle spasms. [name], Grayling Congress, DO  Active   traZODone (DESYREL) 50 MG tablet [id number]  Take 1 tablet (50 mg total) by mouth at bedtime as needed for sleep. [name], Yvonne R, DO  Active   umeclidinium-vilanterol (ANORO ELLIPTA) 62.5-25 MCG/ACT AEPB [id number] No Inhale 1 puff into the lungs daily. Josephine Igo, DO Taking Active   vitamin E 180 MG (400 UNITS) capsule [id number] No Take 1,600 Units by mouth daily. [provider] Taking Active Self             Plan: Telephone follow up appointment with care management team member scheduled for:  1 to 2 months    Henrene Pastor, PharmD Clinical Pharmacist [facility]

## 2023-05-14 ENCOUNTER — Other Ambulatory Visit (HOSPITAL_COMMUNITY): Payer: Self-pay

## 2023-05-14 ENCOUNTER — Other Ambulatory Visit (HOSPITAL_BASED_OUTPATIENT_CLINIC_OR_DEPARTMENT_OTHER): Payer: Self-pay

## 2023-05-14 ENCOUNTER — Other Ambulatory Visit: Payer: Self-pay | Admitting: Family Medicine

## 2023-05-14 ENCOUNTER — Other Ambulatory Visit: Payer: Self-pay | Admitting: Podiatry

## 2023-05-15 ENCOUNTER — Other Ambulatory Visit: Payer: Self-pay

## 2023-05-15 ENCOUNTER — Other Ambulatory Visit (HOSPITAL_COMMUNITY): Payer: Self-pay

## 2023-05-18 ENCOUNTER — Other Ambulatory Visit (HOSPITAL_BASED_OUTPATIENT_CLINIC_OR_DEPARTMENT_OTHER): Payer: Self-pay

## 2023-05-18 MED ORDER — HYDROCODONE-ACETAMINOPHEN 5-325 MG PO TABS
1.0000 | ORAL_TABLET | Freq: Three times a day (TID) | ORAL | 0 refills | Status: DC | PRN
  Filled 2023-05-18: qty 15, 5d supply, fill #0

## 2023-05-21 ENCOUNTER — Other Ambulatory Visit: Payer: Self-pay | Admitting: Family Medicine

## 2023-05-21 ENCOUNTER — Other Ambulatory Visit (HOSPITAL_BASED_OUTPATIENT_CLINIC_OR_DEPARTMENT_OTHER): Payer: Self-pay

## 2023-05-21 MED ORDER — HYDROCODONE-ACETAMINOPHEN 5-325 MG PO TABS
1.0000 | ORAL_TABLET | Freq: Three times a day (TID) | ORAL | 0 refills | Status: DC | PRN
  Filled 2023-05-21 – 2023-05-22 (×2): qty 15, 5d supply, fill #0

## 2023-05-22 ENCOUNTER — Other Ambulatory Visit (HOSPITAL_BASED_OUTPATIENT_CLINIC_OR_DEPARTMENT_OTHER): Payer: Self-pay

## 2023-05-22 ENCOUNTER — Other Ambulatory Visit: Payer: Self-pay

## 2023-05-29 ENCOUNTER — Other Ambulatory Visit (HOSPITAL_COMMUNITY): Payer: Self-pay

## 2023-05-29 DIAGNOSIS — E785 Hyperlipidemia, unspecified: Secondary | ICD-10-CM

## 2023-05-29 DIAGNOSIS — E1165 Type 2 diabetes mellitus with hyperglycemia: Secondary | ICD-10-CM

## 2023-05-29 DIAGNOSIS — I25119 Atherosclerotic heart disease of native coronary artery with unspecified angina pectoris: Secondary | ICD-10-CM

## 2023-05-29 DIAGNOSIS — I10 Essential (primary) hypertension: Secondary | ICD-10-CM

## 2023-05-29 DIAGNOSIS — E1169 Type 2 diabetes mellitus with other specified complication: Secondary | ICD-10-CM

## 2023-06-01 ENCOUNTER — Other Ambulatory Visit: Payer: Self-pay | Admitting: Family Medicine

## 2023-06-01 ENCOUNTER — Other Ambulatory Visit: Payer: Self-pay | Admitting: Podiatry

## 2023-06-01 ENCOUNTER — Other Ambulatory Visit (HOSPITAL_BASED_OUTPATIENT_CLINIC_OR_DEPARTMENT_OTHER): Payer: Self-pay

## 2023-06-01 ENCOUNTER — Other Ambulatory Visit: Payer: Self-pay

## 2023-06-01 DIAGNOSIS — R110 Nausea: Secondary | ICD-10-CM

## 2023-06-01 MED ORDER — ONDANSETRON HCL 4 MG PO TABS
4.0000 mg | ORAL_TABLET | Freq: Three times a day (TID) | ORAL | 2 refills | Status: DC | PRN
  Filled 2023-06-01: qty 20, 7d supply, fill #0
  Filled 2023-07-06: qty 20, 7d supply, fill #1
  Filled 2023-07-28: qty 20, 7d supply, fill #2

## 2023-06-01 NOTE — Telephone Encounter (Signed)
Patient needs to make appointment to be seen before renewals will be sent in

## 2023-06-02 ENCOUNTER — Other Ambulatory Visit (HOSPITAL_BASED_OUTPATIENT_CLINIC_OR_DEPARTMENT_OTHER): Payer: Self-pay

## 2023-06-03 ENCOUNTER — Other Ambulatory Visit (HOSPITAL_BASED_OUTPATIENT_CLINIC_OR_DEPARTMENT_OTHER): Payer: Self-pay

## 2023-06-03 ENCOUNTER — Other Ambulatory Visit: Payer: Self-pay | Admitting: Family Medicine

## 2023-06-03 NOTE — Telephone Encounter (Signed)
Requesting: hydrocodone 5/325 Contract: No UDS: will get at next visit Last Visit: 03/30/2023 Next Visit: 06/05/2023 Last Refill: 05/21/23  Please Advise

## 2023-06-04 ENCOUNTER — Other Ambulatory Visit (HOSPITAL_COMMUNITY): Payer: Self-pay

## 2023-06-05 ENCOUNTER — Other Ambulatory Visit (HOSPITAL_BASED_OUTPATIENT_CLINIC_OR_DEPARTMENT_OTHER): Payer: Self-pay

## 2023-06-05 ENCOUNTER — Ambulatory Visit (INDEPENDENT_AMBULATORY_CARE_PROVIDER_SITE_OTHER): Payer: Medicare HMO | Admitting: Family Medicine

## 2023-06-05 ENCOUNTER — Encounter: Payer: Self-pay | Admitting: Family Medicine

## 2023-06-05 VITALS — BP 128/78 | HR 58 | Temp 98.0°F | Resp 16 | Ht 71.0 in | Wt 201.2 lb

## 2023-06-05 DIAGNOSIS — G6289 Other specified polyneuropathies: Secondary | ICD-10-CM

## 2023-06-05 DIAGNOSIS — R110 Nausea: Secondary | ICD-10-CM

## 2023-06-05 DIAGNOSIS — E1169 Type 2 diabetes mellitus with other specified complication: Secondary | ICD-10-CM | POA: Diagnosis not present

## 2023-06-05 DIAGNOSIS — E785 Hyperlipidemia, unspecified: Secondary | ICD-10-CM | POA: Diagnosis not present

## 2023-06-05 DIAGNOSIS — I25119 Atherosclerotic heart disease of native coronary artery with unspecified angina pectoris: Secondary | ICD-10-CM | POA: Diagnosis not present

## 2023-06-05 DIAGNOSIS — I10 Essential (primary) hypertension: Secondary | ICD-10-CM | POA: Diagnosis not present

## 2023-06-05 DIAGNOSIS — Z0000 Encounter for general adult medical examination without abnormal findings: Secondary | ICD-10-CM

## 2023-06-05 DIAGNOSIS — M4802 Spinal stenosis, cervical region: Secondary | ICD-10-CM

## 2023-06-05 DIAGNOSIS — I700 Atherosclerosis of aorta: Secondary | ICD-10-CM

## 2023-06-05 DIAGNOSIS — E1165 Type 2 diabetes mellitus with hyperglycemia: Secondary | ICD-10-CM | POA: Diagnosis not present

## 2023-06-05 DIAGNOSIS — R351 Nocturia: Secondary | ICD-10-CM

## 2023-06-05 DIAGNOSIS — Z79899 Other long term (current) drug therapy: Secondary | ICD-10-CM

## 2023-06-05 LAB — CBC WITH DIFFERENTIAL/PLATELET
Basophils Absolute: 0.1 10*3/uL (ref 0.0–0.1)
Basophils Relative: 1 % (ref 0.0–3.0)
Eosinophils Absolute: 0.2 10*3/uL (ref 0.0–0.7)
Eosinophils Relative: 2.9 % (ref 0.0–5.0)
HCT: 47.6 % (ref 39.0–52.0)
Hemoglobin: 15.7 g/dL (ref 13.0–17.0)
Lymphocytes Relative: 25.3 % (ref 12.0–46.0)
Lymphs Abs: 1.6 10*3/uL (ref 0.7–4.0)
MCHC: 32.9 g/dL (ref 30.0–36.0)
MCV: 85 fl (ref 78.0–100.0)
Monocytes Absolute: 0.6 10*3/uL (ref 0.1–1.0)
Monocytes Relative: 9.2 % (ref 3.0–12.0)
Neutro Abs: 3.8 10*3/uL (ref 1.4–7.7)
Neutrophils Relative %: 61.6 % (ref 43.0–77.0)
Platelets: 187 10*3/uL (ref 150.0–400.0)
RBC: 5.6 Mil/uL (ref 4.22–5.81)
RDW: 13.6 % (ref 11.5–15.5)
WBC: 6.2 10*3/uL (ref 4.0–10.5)

## 2023-06-05 LAB — COMPREHENSIVE METABOLIC PANEL
ALT: 14 U/L (ref 0–53)
AST: 17 U/L (ref 0–37)
Albumin: 4.6 g/dL (ref 3.5–5.2)
Alkaline Phosphatase: 105 U/L (ref 39–117)
BUN: 27 mg/dL — ABNORMAL HIGH (ref 6–23)
CO2: 26 mEq/L (ref 19–32)
Calcium: 9.5 mg/dL (ref 8.4–10.5)
Chloride: 103 mEq/L (ref 96–112)
Creatinine, Ser: 0.85 mg/dL (ref 0.40–1.50)
GFR: 89.65 mL/min (ref >60.00)
Glucose, Bld: 105 mg/dL — ABNORMAL HIGH (ref 70–99)
Potassium: 4.3 mEq/L (ref 3.5–5.1)
Sodium: 137 mEq/L (ref 135–145)
Total Bilirubin: 1 mg/dL (ref 0.2–1.2)
Total Protein: 7.6 g/dL (ref 6.0–8.3)

## 2023-06-05 LAB — LIPID PANEL
Cholesterol: 143 mg/dL (ref 0–200)
HDL: 38 mg/dL — ABNORMAL LOW (ref >39.00)
LDL Cholesterol: 82 mg/dL (ref 0–99)
NonHDL: 105.36
Total CHOL/HDL Ratio: 4
Triglycerides: 116 mg/dL (ref 0.0–149.0)
VLDL: 23.2 mg/dL (ref 0.0–40.0)

## 2023-06-05 LAB — PSA: PSA: 2.98 ng/mL (ref 0.10–4.00)

## 2023-06-05 LAB — TSH: TSH: 1.22 u[IU]/mL (ref 0.35–5.50)

## 2023-06-05 MED ORDER — HYDROCODONE-ACETAMINOPHEN 5-325 MG PO TABS
1.0000 | ORAL_TABLET | Freq: Three times a day (TID) | ORAL | 0 refills | Status: DC | PRN
  Filled 2023-06-05: qty 15, 5d supply, fill #0

## 2023-06-05 NOTE — Assessment & Plan Note (Signed)
Ghm utd Check labs  See AVS Health Maintenance  Topic Date Due   COVID-19 Vaccine (1) 06/21/2023 (Originally 06/27/1961)   Zoster Vaccines- Shingrix (1 of 2) 09/05/2023 (Originally 06/27/1974)   Pneumonia Vaccine 40+ Years old (1 of 2 - PCV) 06/04/2024 (Originally 06/27/1962)   INFLUENZA VACCINE  07/30/2023   HEMOGLOBIN A1C  09/29/2023   OPHTHALMOLOGY EXAM  02/07/2024   Diabetic kidney evaluation - eGFR measurement  03/29/2024   Diabetic kidney evaluation - Urine ACR  03/29/2024   Medicare Annual Wellness (AWV)  04/28/2024   FOOT EXAM  06/04/2024   DTaP/Tdap/Td (3 - Td or Tdap) 07/31/2024   Colonoscopy  07/19/2031   Hepatitis C Screening  Completed   HPV VACCINES  Aged Out

## 2023-06-05 NOTE — Assessment & Plan Note (Signed)
Tolerating statin, encouraged heart healthy diet, avoid trans fats, minimize simple carbs and saturated fats. Increase exercise as tolerated 

## 2023-06-05 NOTE — Patient Instructions (Signed)
Preventive Care Ronald Petersen, Ronald Petersen Preventive care refers to lifestyle choices and visits with your health care provider that can promote health and wellness. Preventive care visits are also called wellness exams. What can I expect for my preventive care visit? Counseling During your preventive care visit, your health care provider may ask about your: Medical history, including: Past medical problems. Family medical history. History of falls. Current health, including: Emotional well-being. Home life and relationship well-being. Sexual activity. Memory and ability to understand (cognition). Lifestyle, including: Alcohol, nicotine or tobacco, and drug use. Access to firearms. Diet, exercise, and sleep habits. Work and work environment. Sunscreen use. Safety issues such as seatbelt and bike helmet use. Physical exam Your health care provider will check your: Height and weight. These may be used to calculate your BMI (body mass index). BMI is a measurement that tells if you are at a healthy weight. Waist circumference. This measures the distance around your waistline. This measurement also tells if you are at a healthy weight and may help predict your risk of certain diseases, such as type 2 diabetes and high blood pressure. Heart rate and blood pressure. Body temperature. Skin for abnormal spots. What immunizations do I need?  Vaccines are usually given at various ages, according to a schedule. Your health care provider will recommend vaccines for you based on your age, medical history, and lifestyle or other factors, such as travel or where you work. What tests do I need? Screening Your health care provider may recommend screening tests for certain conditions. This may include: Lipid and cholesterol levels. Diabetes screening. This is done by checking your blood sugar (glucose) after you have not eaten for a while (fasting). Hepatitis C test. Hepatitis B test. HIV (human  immunodeficiency virus) test. STI (sexually transmitted infection) testing, if you are at risk. Lung cancer screening. Colorectal cancer screening. Prostate cancer screening. Abdominal aortic aneurysm (AAA) screening. You may need this if you are a current or former smoker. Talk with your health care provider about your test results, treatment options, and if necessary, the need for more tests. Follow these instructions at home: Eating and drinking  Eat a diet that includes fresh fruits and vegetables, whole grains, lean protein, and low-fat dairy products. Limit your intake of foods with high amounts of sugar, saturated fats, and salt. Take vitamin and mineral supplements as recommended by your health care provider. Do not drink alcohol if your health care provider tells you not to drink. If you drink alcohol: Limit how much you have to 0-2 drinks a day. Know how much alcohol is in your drink. In the [place] one drink equals one 12 oz bottle of beer (355 mL), one 5 oz glass of wine (148 mL), or one 1 oz glass of hard liquor (44 mL). Lifestyle Brush your teeth every morning and night with fluoride toothpaste. Floss one time each day. Exercise for at least 30 minutes 5 or more days each week. Do not use any products that contain nicotine or tobacco. These products include cigarettes, chewing tobacco, and vaping devices, such as e-cigarettes. If you need help quitting, ask your health care provider. Do not use drugs. If you are sexually active, practice safe sex. Use a condom or other form of protection to prevent STIs. Take aspirin only as told by your health care provider. Make sure that you understand how much to take and what form to take. Work with your health care provider to find out whether it is safe   and beneficial for you to take aspirin daily. Ask your health care provider if you need to take a cholesterol-lowering medicine (statin). Find healthy ways to manage stress, such  as: Meditation, yoga, or listening to music. Journaling. Talking to a trusted person. Spending time with friends and family. Safety Always wear your seat belt while driving or riding in a vehicle. Do not drive: If you have been drinking alcohol. Do not ride with someone who has been drinking. When you are tired or distracted. While texting. If you have been using any mind-altering substances or drugs. Wear a helmet and other protective equipment during sports activities. If you have firearms in your house, make sure you follow all gun safety procedures. Minimize exposure to UV radiation to reduce your risk of skin cancer. What's next? Visit your health care provider once a year for an annual wellness visit. Ask your health care provider how often you should have your eyes and teeth checked. Stay up to date on all vaccines. This information is not intended to replace advice given to you by your health care provider. Make sure you discuss any questions you have with your health care provider. Document Revised: 06/12/2021 Document Reviewed: 06/12/2021 Elsevier Patient Education  2024 [employer]  

## 2023-06-05 NOTE — Assessment & Plan Note (Signed)
Well controlled, no changes to meds. Encouraged heart healthy diet such as the DASH diet and exercise as tolerated.  °

## 2023-06-05 NOTE — Progress Notes (Addendum)
Subjective:   By signing my name below, I, Carlena Bjornstad, attest that this documentation has been prepared under the direction and in the presence of Seabron Spates R, DO. 06/05/2023.   Patient ID: SHLOME GARRIDO, male    DOB: 09/20/1956, 67 y.o.   MRN: [id number]  Chief Complaint  Patient presents with   Annual Exam    Annual Exam     HPI Patient is in today for a comprehensive physical exam.  He is requesting refills for his hydrocodone.  Lately he has been waking up in the middle of the night and his entire right arm and hand will be numb/aching. When he wakes up he isn't lying on his arm. He is unable to sleep on his right side. He notes that this was not an issue previously.   He complains of frequent pain inferior to his left ribcage. This was previously tender to palpation but not during today's exam.   Also he states that his legs "look like balloons at night," no swelling at this time. He notes that he is having trouble with 2 of his right toes, with some shooting pains on the sole of his foot near the great toe.  Additionally he complains of an intermittent skin rash located to the left medial shin, closer to his ankle today. Previously the rash has spread up to his knee, causing him to think it was secondary to wearing his boots. However, he hasn't worn the boots yesterday or today and his rash is currently present.  His hearing has been stable.  Social history: He confirms that there are no planned surgeries at this time. In his family, he reports that his sister had a CABG. Colonoscopy:  Last completed 07/18/2021. PSA:  Last completed 06/03/2022. Immunizations:  He defers the pneumonia vaccination today. Influenza vaccine last received 02/26/2016. Tdap received 07/31/2014. Diet:  He also complains of GI issues. He is frequently nauseated. He currently takes protonix. He affirms that his blood sugars have been stable in his usual range 120s-130s, which is when he feels the  best. If it drops under 100 or rises to 170, he feels unwell. Vision:  He had an eye exam 02/06/2023 at [facility].  Denies having any fever, new moles, congestion, sinus pain, sore throat, chest pain, palpitations, cough, SOB, wheezing, vomiting/diarrhea, constipation, blood in stool, dysuria, frequency, hematuria, at this time.  Past Medical History:  Diagnosis Date   Allergy    Anxiety    Arthritis    CAD (coronary artery disease)    Cancer (HCC)    CHF (congestive heart failure) (HCC)    Complication of anesthesia    aspirated with back surgery at age 86   Depression    Diabetes mellitus Type II    GERD (gastroesophageal reflux disease)    Hyperlipidemia    Hypertension    Mild aortic stenosis    Mild carotid artery disease (HCC)    Neuromuscular disorder (HCC)    NEUROPATHY   Plavix resistance    abnormal cytochrome P450 2c19 genotype in 2021 inferring suboptimal Plavix platelet inhibition,   Right rotator cuff tear 11/23/2018   Sleep apnea    no CPAP    Past Surgical History:  Procedure Laterality Date   APPENDECTOMY     ARTHOSCOPIC ROTAOR CUFF REPAIR Right 11/23/2018   Procedure: ARTHROSCOPIC ROTATOR CUFF REPAIR;  Surgeon: Teryl Lucy, MD;  Location: [facility];  Service: Orthopedics;  Laterality: Right;   CARDIAC  VALVE REPLACEMENT     CHOLECYSTECTOMY     CORONARY ARTERY BYPASS GRAFT N/A 12/24/2021   x2 LIMA to LAD; SVG to Obtuse Marginal   CORONARY STENT INTERVENTION N/A 11/19/2020   Procedure: CORONARY STENT INTERVENTION;  Surgeon:  Kendall, MD;  Location: [facility];  Service: Cardiovascular;  Laterality: N/A;   CORONARY ULTRASOUND/IVUS N/A 11/19/2020   Procedure: Intravascular Ultrasound/IVUS;  Surgeon:  Kendall, MD;  Location: [facility];  Service: Cardiovascular;  Laterality: N/A;   ENDOVEIN HARVEST OF GREATER SAPHENOUS VEIN Right 12/24/2021   Procedure: ENDOVEIN HARVEST OF GREATER SAPHENOUS VEIN;   Surgeon: Corliss Skains, MD;  Location: [facility] OR;  Service: Open Heart Surgery;  Laterality: Right;   FRACTURE SURGERY     IR THORACENTESIS ASP PLEURAL SPACE W/IMG GUIDE  01/31/2022   LEFT HEART CATH AND CORONARY ANGIOGRAPHY N/A 11/19/2020   Procedure: LEFT HEART CATH AND CORONARY ANGIOGRAPHY;  Surgeon:  Kendall, MD;  Location: [facility];  Service: Cardiovascular;  Laterality: N/A;   LEFT HEART CATH AND CORONARY ANGIOGRAPHY N/A 12/24/2020   Procedure: LEFT HEART CATH AND CORONARY ANGIOGRAPHY;  Surgeon: Swazil[place], Peter M, MD;  Location: [facility] INVASIVE CV LAB;  Service: Cardiovascular;  Laterality: N/A;   LEFT HEART CATH AND CORONARY ANGIOGRAPHY N/A 12/16/2021   Procedure: LEFT HEART CATH AND CORONARY ANGIOGRAPHY;  Surgeon: Marykay Lex, MD;  Location: [facility];  Service: Cardiovascular;  Laterality: N/A;   LEFT HEART CATH AND CORS/GRAFTS ANGIOGRAPHY N/A 03/18/2023   Procedure: LEFT HEART CATH AND CORS/GRAFTS ANGIOGRAPHY;  Surgeon: Corky Crafts, MD;  Location: [facility] INVASIVE CV LAB;  Service: Cardiovascular;  Laterality: N/A;   LUMBAR LAMINECTOMY     SHOULDER ARTHROSCOPY WITH ROTATOR CUFF REPAIR AND SUBACROMIAL DECOMPRESSION Right 11/23/2018   Procedure: SHOULDER ARTHROSCOPY WITH ROTATOR CUFF REPAIR AND SUBACROMIAL DECOMPRESSION;  Surgeon: Teryl Lucy, MD;  Location: [facility];  Service: Orthopedics;  Laterality: Right;   SPINE SURGERY     TEE WITHOUT CARDIOVERSION N/A 12/24/2021   Procedure: TRANSESOPHAGEAL ECHOCARDIOGRAM (TEE);  Surgeon: Corliss Skains, MD;  Location: [facility] OR;  Service: Open Heart Surgery;  Laterality: N/A;    Family History  Problem Relation Age of Onset   COPD Mother    Stroke Father    Ovarian cancer Sister    Stomach cancer Sister    Heart disease Sister        MI   Heart disease Sister        MI   Stomach cancer Maternal Grandmother    Alcohol abuse Other    Depression Other    Arthritis Other     Hypertension Other    Coronary artery disease Other    Ovarian cancer Other        neice   Ovarian cancer Other        neice   Colon polyps Neg Hx    Colon cancer Neg Hx     Social History   Socioeconomic History   Marital status: Married    Spouse name: Erie Noe   Number of children: 2   Years of education: Not on file   Highest education level: Not on file  Occupational History   Occupation: self employed    Employer: UNEMPLOYED  Tobacco Use   Smoking status: Former    Years: 16    Types: Cigarettes    Quit date: 12/29/1985    Years since quitting: 37.4   Smokeless tobacco: Never  Vaping  Use   Vaping Use: Never used  Substance and Sexual Activity   Alcohol use: Not Currently   Drug use: No   Sexual activity: Yes    Partners: Female  Other Topics Concern   Not on file  Social History Narrative   Exercise-- 3 days   Pt has hs degree   Right handed   One story home   Drinks caffeine 2 cups in am   Are you right handed or left handed?    Are you currently employed ?    What is your current occupation? retired   Do you live at home alone?   Who lives with you?  wife   What type of home do you live in: 1 story or 2 story?        Social Determinants of Health   Financial Resource Strain: Medium Risk (01/19/2023)   Overall Financial Resource Strain (CARDIA)    Difficulty of Paying Living Expenses: Somewhat hard  Food Insecurity: Food Insecurity Present (03/17/2023)   Hunger Vital Sign    Worried About Running Out of Food in the Last Year: Never true    Ran Out of Food in the Last Year: Often true  Transportation Needs: No Transportation Needs (03/17/2023)   PRAPARE - Administrator, Civil Service (Medical): No    Lack of Transportation (Non-Medical): No  Physical Activity: Sufficiently Active (02/04/2022)   Exercise Vital Sign    Days of Exercise per Week: 7 days    Minutes of Exercise per Session: 30 min  Stress: Stress Concern Present (01/19/2023)    [employer] of Occupational Health - Occupational Stress Questionnaire    Feeling of Stress : To some extent  Social Connections: Moderately Integrated (01/27/2023)   Social Connection and Isolation Panel [NHANES]    Frequency of Communication with Friends and Family: More than three times a week    Frequency of Social Gatherings with Friends and Family: More than three times a week    Attends Religious Services: More than 4 times per year    Active Member of [employer] or Organizations: No    Attends Banker Meetings: Not on file    Marital Status: Married  Catering manager Violence: Not At Risk (03/17/2023)   Humiliation, Afraid, Rape, and Kick questionnaire    Fear of Current or Ex-Partner: No    Emotionally Abused: No    Physically Abused: No    Sexually Abused: No    Outpatient Medications Prior to Visit  Medication Sig Dispense Refill   acetaminophen (TYLENOL) 650 MG CR tablet Take 650 mg by mouth every 8 (eight) hours as needed for pain.     aspirin EC 325 MG EC tablet Take 1 tablet (325 mg total) by mouth daily.     ELDERBERRY PO Take 300 mg by mouth daily.     empagliflozin (JARDIANCE) 25 MG TABS tablet Take 1 tablet (25 mg total) by mouth daily before breakfast. 90 tablet 3   ezetimibe (ZETIA) 10 MG tablet Take 1 tablet (10 mg total) by mouth daily. For cholesterol 90 tablet 3   famotidine (PEPCID) 20 MG tablet Take 1 tablet (20 mg total) by mouth daily. 90 tablet 1   fenofibrate 160 MG tablet Take 1 tablet (160 mg total) by mouth daily. 90 tablet 1   gabapentin (NEURONTIN) 800 MG tablet Take 1 tablet (800 mg total) by mouth 3 (three) times daily. 270 tablet 2   glucose blood (ONETOUCH VERIO) test strip  USE AS INSTRUCTED TO CHECK BLOOD SUGAR ONCE A DAY 100 strip 12   Magnesium Citrate 200 MG TABS Take 400 mg by mouth daily.     ondansetron (ZOFRAN) 4 MG tablet Take 1 tablet (4 mg total) by mouth every 8 (eight) hours as needed. 20 tablet 2   pantoprazole  (PROTONIX) 40 MG tablet Take 1 tablet (40 mg total) by mouth 2 (two) times daily. 180 tablet 0   sertraline (ZOLOFT) 100 MG tablet Take 1.5 tablets (150 mg total) by mouth daily. 135 tablet 1   tiZANidine (ZANAFLEX) 4 MG tablet Take 1 tablet (4 mg total) by mouth every 6 (six) hours as needed for muscle spasms. 30 tablet 1   traZODone (DESYREL) 50 MG tablet Take 1 tablet (50 mg total) by mouth at bedtime as needed for sleep. 90 tablet 0   umeclidinium-vilanterol (ANORO ELLIPTA) 62.5-25 MCG/ACT AEPB Inhale 1 puff into the lungs daily. 60 each 5   vitamin E 180 MG (400 UNITS) capsule Take 1,600 Units by mouth daily.     HYDROcodone-acetaminophen (NORCO/VICODIN) 5-325 MG tablet Take 1 tablet by mouth every 8 (eight) hours as needed. 15 tablet 0   No facility-administered medications prior to visit.    Allergies  Allergen Reactions   Niacin Anaphylaxis    Flushing - required ED visit    Review of Systems  Constitutional:  Negative for fever and malaise/fatigue.  HENT:  Negative for congestion, sinus pain and sore throat.   Eyes:  Negative for blurred vision.  Respiratory:  Negative for cough, shortness of breath and wheezing.   Cardiovascular:  Positive for leg swelling (Nocturnal). Negative for chest pain and palpitations.  Gastrointestinal:  Positive for abdominal pain (Left) and nausea. Negative for blood in stool, constipation, diarrhea and vomiting.  Genitourinary:  Negative for dysuria, frequency and hematuria.  Musculoskeletal:  Negative for falls.       + Aching pains of right arm and hand + Pain on sole of foot  Skin:  Positive for rash (Left medial shin).       (-) New moles.  Neurological:  Negative for dizziness, loss of consciousness and headaches.       + Nocturnal numbness of right arm and hand  Endo/Heme/Allergies:  Negative for environmental allergies.  Psychiatric/Behavioral:  Negative for depression. The patient is not nervous/anxious.        Objective:     Physical Exam Vitals and nursing note reviewed.  Constitutional:      General: He is not in acute distress.    Appearance: Normal appearance. He is not ill-appearing.  HENT:     Head: Normocephalic and atraumatic.     Right Ear: Tympanic membrane, ear canal and external ear normal.     Left Ear: Tympanic membrane, ear canal and external ear normal.  Eyes:     Extraocular Movements: Extraocular movements intact.     Pupils: Pupils are equal, round, and reactive to light.  Cardiovascular:     Rate and Rhythm: Normal rate and regular rhythm.     Heart sounds: Murmur heard.     Systolic murmur is present with a grade of 2/6.     No gallop.  Pulmonary:     Effort: Pulmonary effort is normal. No respiratory distress.     Breath sounds: Normal breath sounds. No wheezing or rales.  Abdominal:     General: Bowel sounds are normal. There is no distension.     Palpations: Abdomen is soft.  Tenderness: There is no abdominal tenderness. There is no guarding.  Musculoskeletal:        General: Normal range of motion.     Right lower leg: No edema.     Left lower leg: No edema.  Skin:    General: Skin is warm and dry.     Comments: + Erythematous, non-raised skin rash of left medial shin.  Neurological:     General: No focal deficit present.     Mental Status: He is alert and oriented to person, place, and time.  Psychiatric:        Mood and Affect: Mood normal.        Behavior: Behavior normal.     BP 128/78 (BP Location: Left Arm, Patient Position: Sitting, Cuff Size: Normal)   Pulse (!) 58   Temp 98 F (36.7 C) (Oral)   Resp 16   Ht 5\' 11"  (1.803 m)   Wt 201 lb 3.2 oz (91.3 kg)   SpO2 98%   BMI 28.06 kg/m  Wt Readings from Last 3 Encounters:  06/05/23 201 lb 3.2 oz (91.3 kg)  04/14/23 204 lb (92.5 kg)  04/13/23 200 lb (90.7 kg)    Diabetic Foot Exam - Simple   Simple Foot Form Diabetic Foot exam was performed with the following findings: Yes 06/05/2023 10:03 AM   Visual Inspection No deformities, no ulcerations, no other skin breakdown bilaterally: Yes Sensation Testing Intact to touch and monofilament testing bilaterally: Yes Pulse Check Posterior Tibialis and Dorsalis pulse intact bilaterally: Yes Comments    Lab Results  Component Value Date   WBC 7.1 03/30/2023   HGB 15.9 03/30/2023   HCT 47.3 03/30/2023   PLT 174.0 03/30/2023   GLUCOSE 114 (H) 03/30/2023   CHOL 171 03/30/2023   TRIG 128.0 03/30/2023   HDL 42.20 03/30/2023   LDLDIRECT 93.0 05/31/2020   LDLCALC 104 (H) 03/30/2023   ALT 18 03/30/2023   AST 14 03/30/2023   NA 139 03/30/2023   K 4.6 03/30/2023   CL 107 03/30/2023   CREATININE 0.90 03/30/2023   BUN 18 03/30/2023   CO2 27 03/30/2023   TSH 1.971 03/18/2023   PSA 1.35 06/03/2022   INR 1.1 01/25/2022   HGBA1C 7.2 (H) 03/30/2023   MICROALBUR <0.7 03/30/2023    Lab Results  Component Value Date   TSH 1.971 03/18/2023   Lab Results  Component Value Date   WBC 7.1 03/30/2023   HGB 15.9 03/30/2023   HCT 47.3 03/30/2023   MCV 83.9 03/30/2023   PLT 174.0 03/30/2023   Lab Results  Component Value Date   NA 139 03/30/2023   K 4.6 03/30/2023   CO2 27 03/30/2023   GLUCOSE 114 (H) 03/30/2023   BUN 18 03/30/2023   CREATININE 0.90 03/30/2023   BILITOT 0.6 03/30/2023   ALKPHOS 86 03/30/2023   AST 14 03/30/2023   ALT 18 03/30/2023   PROT 7.0 03/30/2023   ALBUMIN 4.4 03/30/2023   CALCIUM 9.6 03/30/2023   ANIONGAP 8 03/17/2023   EGFR 94 05/14/2022   GFR 88.22 03/30/2023   Lab Results  Component Value Date   CHOL 171 03/30/2023   Lab Results  Component Value Date   HDL 42.20 03/30/2023   Lab Results  Component Value Date   LDLCALC 104 (H) 03/30/2023   Lab Results  Component Value Date   TRIG 128.0 03/30/2023   Lab Results  Component Value Date   CHOLHDL 4 03/30/2023   Lab Results  Component Value  Date   HGBA1C 7.2 (H) 03/30/2023       Assessment & Plan:   Problem List Items Addressed  This Visit       Unprioritized   Nocturia   Relevant Orders   PSA   Coronary artery disease involving native coronary artery of native heart with angina pectoris (HCC) (Chronic)   Relevant Orders   Lipid panel   TSH   Comprehensive metabolic panel   CBC with Differential/Platelet   Preventative health care - Primary    Ghm utd Check labs  See AVS Health Maintenance  Topic Date Due   COVID-19 Vaccine (1) 06/21/2023 (Originally 06/27/1961)   Zoster Vaccines- Shingrix (1 of 2) 09/05/2023 (Originally [birth year]27/1975)   Pneumonia Vaccine 42+ Years old (1 of 2 - PCV) 06/04/2024 (Originally 06/27/1962)   INFLUENZA VACCINE  07/30/2023   HEMOGLOBIN A1C  09/29/2023   OPHTHALMOLOGY EXAM  02/07/2024   Diabetic kidney evaluation - eGFR measurement  03/29/2024   Diabetic kidney evaluation - Urine ACR  03/29/2024   Medicare Annual Wellness (AWV)  04/28/2024   FOOT EXAM  06/04/2024   DTaP/Tdap/Td (3 - Td or Tdap) 07/31/2024   Colonoscopy  07/19/2031   Hepatitis C Screening  Completed   HPV VACCINES  Aged Out        Peripheral neuropathy    worsening      Relevant Orders   Ambulatory referral to Podiatry   Nausea    On protonix Refer to GI      Relevant Orders   Ambulatory referral to Gastroenterology   Hyperlipidemia LDL goal <70 (Chronic)    Tolerating statin, encouraged heart healthy diet, avoid trans fats, minimize simple carbs and saturated fats. Increase exercise as tolerated       Relevant Orders   Lipid panel   Hyperlipidemia associated with type 2 diabetes mellitus (HCC) (Chronic)    Tolerating statin, encouraged heart healthy diet, avoid trans fats, minimize simple carbs and saturated fats. Increase exercise as tolerated       Relevant Orders   Lipid panel   TSH   Comprehensive metabolic panel   CBC with Differential/Platelet   Foraminal stenosis of cervical region    Mri c spine Refer back to neuro surgery Refill pain med and update contract and uds       Relevant Medications   HYDROcodone-acetaminophen (NORCO/VICODIN) 5-325 MG tablet   Other Relevant Orders   MR Cervical Spine Wo Contrast   Ambulatory referral to Neurosurgery   Essential hypertension (Chronic)    Well controlled, no changes to meds. Encouraged heart healthy diet such as the DASH diet and exercise as tolerated.        Relevant Orders   TSH   Comprehensive metabolic panel   CBC with Differential/Platelet   Diabetes mellitus, type II (HCC)    Check labs  Lab Results  Component Value Date   HGBA1C 7.2 (H) 03/30/2023        Relevant Orders   Ambulatory referral to Podiatry   Aortic atherosclerosis [facility])    Check labs      Other Visit Diagnoses     High risk medication use       Relevant Orders   Drug Monitoring Panel [phone] , Urine        Meds ordered this encounter  Medications   HYDROcodone-acetaminophen (NORCO/VICODIN) 5-325 MG tablet    Sig: Take 1 tablet by mouth every 8 (eight) hours as needed.    Dispense:  15 tablet  Refill:  0    May fill on May 26.    I, Makela Niehoff R Lowne Chase, DO, personally preformed the services described in this documentation.  All medical record entries made by the scribe were at my direction and in my presence.  I have reviewed the chart and discharge instructions (if applicable) and agree that the record reflects my personal performance and is accurate and complete. 06/05/2023.  I,Mathew Stumpf,acting as a Neurosurgeon for [employer], DO.,have documented all relevant documentation on the behalf of Donato Schultz, DO,as directed by  Donato Schultz, DO while in the presence of Donato Schultz, DO.   Donato Schultz, DO

## 2023-06-05 NOTE — Assessment & Plan Note (Signed)
worsening

## 2023-06-05 NOTE — Assessment & Plan Note (Signed)
Check labs 

## 2023-06-05 NOTE — Assessment & Plan Note (Signed)
Mri c spine Refer back to neuro surgery Refill pain med and update contract and uds

## 2023-06-05 NOTE — Assessment & Plan Note (Signed)
On protonix Refer to GI

## 2023-06-05 NOTE — Assessment & Plan Note (Addendum)
Check labs  Lab Results  Component Value Date   HGBA1C 7.2 (H) 03/30/2023

## 2023-06-07 LAB — DRUG MONITORING PANEL 376104, URINE
Amphetamines: NEGATIVE ng/mL (ref <500)
Barbiturates: NEGATIVE ng/mL (ref <300)
Benzodiazepines: NEGATIVE ng/mL (ref <100)
Cocaine Metabolite: NEGATIVE ng/mL (ref <150)
Codeine: NEGATIVE ng/mL (ref <50)
Desmethyltramadol: NEGATIVE ng/mL (ref <100)
Hydrocodone: 1031 ng/mL — ABNORMAL HIGH (ref <50)
Hydromorphone: 115 ng/mL — ABNORMAL HIGH (ref <50)
Morphine: NEGATIVE ng/mL (ref <50)
Norhydrocodone: 1495 ng/mL — ABNORMAL HIGH (ref <50)
Opiates: POSITIVE ng/mL — AB (ref <100)
Oxycodone: NEGATIVE ng/mL (ref <100)
Tramadol: NEGATIVE ng/mL (ref <100)

## 2023-06-07 LAB — DM TEMPLATE

## 2023-06-08 ENCOUNTER — Other Ambulatory Visit (HOSPITAL_BASED_OUTPATIENT_CLINIC_OR_DEPARTMENT_OTHER): Payer: Self-pay

## 2023-06-10 ENCOUNTER — Telehealth: Payer: Medicare HMO

## 2023-06-11 ENCOUNTER — Other Ambulatory Visit (HOSPITAL_BASED_OUTPATIENT_CLINIC_OR_DEPARTMENT_OTHER): Payer: Self-pay

## 2023-06-11 ENCOUNTER — Other Ambulatory Visit: Payer: Self-pay

## 2023-06-11 ENCOUNTER — Other Ambulatory Visit: Payer: Self-pay | Admitting: Podiatry

## 2023-06-11 ENCOUNTER — Other Ambulatory Visit: Payer: Self-pay | Admitting: Family Medicine

## 2023-06-11 DIAGNOSIS — M4802 Spinal stenosis, cervical region: Secondary | ICD-10-CM

## 2023-06-11 NOTE — Telephone Encounter (Signed)
Patient needs an appointment before refill can be submitted.

## 2023-06-12 ENCOUNTER — Other Ambulatory Visit: Payer: Self-pay | Admitting: Family Medicine

## 2023-06-12 ENCOUNTER — Other Ambulatory Visit (HOSPITAL_BASED_OUTPATIENT_CLINIC_OR_DEPARTMENT_OTHER): Payer: Self-pay

## 2023-06-12 DIAGNOSIS — M4802 Spinal stenosis, cervical region: Secondary | ICD-10-CM

## 2023-06-15 ENCOUNTER — Ambulatory Visit (INDEPENDENT_AMBULATORY_CARE_PROVIDER_SITE_OTHER): Payer: Medicare HMO | Admitting: Pharmacist

## 2023-06-15 ENCOUNTER — Other Ambulatory Visit: Payer: Self-pay

## 2023-06-15 ENCOUNTER — Other Ambulatory Visit (HOSPITAL_BASED_OUTPATIENT_CLINIC_OR_DEPARTMENT_OTHER): Payer: Self-pay

## 2023-06-15 DIAGNOSIS — E119 Type 2 diabetes mellitus without complications: Secondary | ICD-10-CM

## 2023-06-15 DIAGNOSIS — E785 Hyperlipidemia, unspecified: Secondary | ICD-10-CM

## 2023-06-15 DIAGNOSIS — I10 Essential (primary) hypertension: Secondary | ICD-10-CM

## 2023-06-15 MED ORDER — GABAPENTIN 800 MG PO TABS
800.0000 mg | ORAL_TABLET | Freq: Three times a day (TID) | ORAL | 1 refills | Status: DC
  Filled 2023-06-15: qty 270, 90d supply, fill #0
  Filled 2023-09-29: qty 270, 90d supply, fill #1

## 2023-06-15 MED ORDER — NEXLIZET 180-10 MG PO TABS
1.0000 | ORAL_TABLET | Freq: Every day | ORAL | 1 refills | Status: DC
  Filled 2023-06-15: qty 90, 90d supply, fill #0
  Filled 2023-09-07 – 2023-10-15 (×2): qty 90, 90d supply, fill #1

## 2023-06-15 NOTE — Progress Notes (Signed)
Chronic Care Management Pharmacy Note  06/15/2023 Name:  Ronald Petersen MRN:  [id number] DOB:  Apr 14, 1956  Summary:  CAD / Hyperlipidemia: Not at LDL goal of < 55 History of CABG x 2 with stent; father had stroke Current therapy - fenofibrate 160mg  daily and ezetimibe 10mg  daily  Past therapy - rosuvastatin 40mg  daily (patient declined) pravastatin 40mg  - stopped 2021; rosuvastatin 20mg  - stopped 2021. Niacin - stopped 2007 due to severe flushing requiring ED visit.   Type 2 DM:  Last A1c at goal.  Current therapy - Jardiance 25mg  daily.  He was able to get last Rx for 90 days by using [employer]. Since his last visit patient has received letter from [employer] that he was approved for Extra Help / LIS.   Past therapy - metformin - stopped because patient was concerned about potential side effects.   Reports he checks blood glucose daily. Home blood glucose has been 110, 127 - highest 150.   Blood pressure:  BP Readings from Last 3 Encounters:  06/05/23 128/78  04/14/23 128/80  04/13/23 110/60   Anxiety / OCD:  Current therapy - sertraline 100mg  -  tablet daily  Patient reports no changes since last visit. Sertraline is working well.   Medication Management:  Reviewed med list and updated  Reviewed refill history.  Patient reports no issues with medication costs not that he was been re-enrolled in LIS / Extra Help thru Medicare. Had only family planning plan with Medicaid.  Since he has LIS - will not be able to use [facility] - called pharmacy and [facility] regarding patient new qualification for LIS.  Patient asked if he could get an increase in quantity of hydrocodone / acetaminophen on next refill. Noticed there was a refill request pending.   Recommendations/Changes made from today's visit: Continue current therapy - no medication changes made today.  Patient will try Nexlizet in place of ezetimibe to try to get LDL < 70. Continue fenofibrate 160mg   daily for triglycerides. Patient declines statin therapy.   Discussed blood glucose goals.  Fasting blood glucose goal (before meals) = 80 to 130 Blood glucose goal after a meal = less than 180   Added request for quantity increase for Hydrocodone / acetaminophen   Subjective: Ronald Petersen is an 67 y.o. year old male who is a primary patient of Donato Schultz, DO.  The patient was referred to the Chronic Care Management team for assistance with care management needs subsequent to provider initiation of CCM services and plan of care.    Engaged with patient by telephone for follow up visit in response to provider referral for CCM services.   Objective:  LABS:    Lab Results  Component Value Date   CREATININE 0.85 06/05/2023   CREATININE 0.90 03/30/2023   CREATININE 0.90 03/17/2023     Lab Results  Component Value Date   HGBA1C 7.2 (H) 03/30/2023         Component Value Date/Time   CHOL 143 06/05/2023 1011   TRIG 116.0 06/05/2023 1011   TRIG 148 12/10/2006 1603   HDL 38.00 (L) 06/05/2023 1011   CHOLHDL 4 06/05/2023 1011   VLDL 23.2 06/05/2023 1011   LDLCALC 82 06/05/2023 1011   LDLCALC 96 09/06/2020 1138   LDLDIRECT 93.0 05/31/2020 0910     Clinical ASCVD: Yes   The 10-year ASCVD risk score (Arnett DK, et al., 2019) is: 25%   Values used to calculate the score:  Age: 64 years     Sex: Male     Is Non-Hispanic African American: No     Diabetic: Yes     Tobacco smoker: No     Systolic Blood Pressure: 128 mmHg     Is BP treated: No     HDL Cholesterol: 38 mg/dL     Total Cholesterol: 143 mg/dL     BP Readings from Last 3 Encounters:  06/05/23 128/78  04/14/23 128/80  04/13/23 110/60      SDOH:  (Social Determinants of Health) assessments and interventions performed:    Allergies  Allergen Reactions   Niacin Anaphylaxis    Flushing - required ED visit    Medications Reviewed Today     Reviewed by Henrene Pastor, RPH-CPP (Pharmacist) on  06/15/23 at 1432  Med List Status: <None>   Medication Order Taking? Sig Documenting Provider Last Dose Status Informant  acetaminophen (TYLENOL) 650 MG CR tablet [id number] Yes Take 650 mg by mouth every 8 (eight) hours as needed for pain. [provider] Taking Active Self  aspirin EC 325 MG EC tablet [id number] Yes Take 1 tablet (325 mg total) by mouth daily. Renee Rival Taking Active Self  ELDERBERRY PO [id number] Yes Take 300 mg by mouth daily. [provider] Taking Active Self  empagliflozin (JARDIANCE) 25 MG TABS tablet [id number] Yes Take 1 tablet (25 mg total) by mouth daily before breakfast. [name], Grayling Congress, DO Taking Active   ezetimibe (ZETIA) 10 MG tablet [id number] Yes Take 1 tablet (10 mg total) by mouth daily. For cholesterol Donato Schultz, DO Taking Active   famotidine (PEPCID) 20 MG tablet [id number] Yes Take 1 tablet (20 mg total) by mouth daily. Donato Schultz, DO Taking Active            Med Note (CRUTHIS, CHLOE C   Wed Mar 18, 2023  9:13 AM)    fenofibrate 160 MG tablet [id number] Yes Take 1 tablet (160 mg total) by mouth daily. Donato Schultz, DO Taking Active   gabapentin (NEURONTIN) 800 MG tablet [id number] Yes Take 1 tablet (800 mg total) by mouth 3 (three) times daily. Seabron Spates R, DO Taking Active   glucose blood [facility] VERIO) test strip [id number] Yes USE AS INSTRUCTED TO CHECK BLOOD SUGAR ONCE A DAY Donato Schultz, DO Taking Active   HYDROcodone-acetaminophen (NORCO/VICODIN) 5-325 MG tablet [id number] Yes Take 1 tablet by mouth every 8 (eight) hours as needed. Seabron Spates R, DO Taking Active   Magnesium Citrate 200 MG TABS [id number] Yes Take 400 mg by mouth daily. [provider] Taking Active Self  ondansetron (ZOFRAN) 4 MG tablet [id number] Yes Take 1 tablet (4 mg total) by mouth every 8 (eight) hours as needed. Donato Schultz, DO Taking Active   pantoprazole (PROTONIX) 40 MG tablet  [id number] Yes Take 1 tablet (40 mg total) by mouth 2 (two) times daily. Donato Schultz, DO Taking Active   sertraline (ZOLOFT) 100 MG tablet [id number] Yes Take 1.5 tablets (150 mg total) by mouth daily. Donato Schultz, DO Taking Active   tiZANidine (ZANAFLEX) 4 MG tablet [id number] Yes Take 1 tablet (4 mg total) by mouth every 6 (six) hours as needed for muscle spasms. Donato Schultz, DO Taking Active   traZODone (DESYREL) 50 MG tablet [id number] Yes Take 1 tablet (50 mg total) by mouth at bedtime as needed for sleep. [name], Grayling Congress,  DO Taking Active   umeclidinium-vilanterol (ANORO ELLIPTA) 62.5-25 MCG/ACT AEPB [id number] Yes Inhale 1 puff into the lungs daily. Josephine Igo, DO Taking Active   vitamin E 180 MG (400 UNITS) capsule [id number] Yes Take 1,600 Units by mouth daily. [provider] Taking Active Self             Plan: Telephone follow up appointment with care management team member scheduled for:  1 to 2 months    Henrene Pastor, PharmD Clinical Pharmacist [facility]

## 2023-06-15 NOTE — Telephone Encounter (Signed)
Patient is asking if he might be able to get a higher quantity (#15 - 5 day supply)?

## 2023-06-15 NOTE — Telephone Encounter (Signed)
Requesting: hydrocodone 5-325mg   Contract: 03/07/21 UDS: 06/05/23 Last Visit: 06/05/23 Next Visit: None Last Refill: 06/05/23 #15 and 0RF  Please Advise

## 2023-06-17 ENCOUNTER — Other Ambulatory Visit (HOSPITAL_BASED_OUTPATIENT_CLINIC_OR_DEPARTMENT_OTHER): Payer: Self-pay

## 2023-06-17 ENCOUNTER — Telehealth: Payer: Self-pay | Admitting: Family Medicine

## 2023-06-17 MED ORDER — HYDROCODONE-ACETAMINOPHEN 5-325 MG PO TABS
1.0000 | ORAL_TABLET | Freq: Three times a day (TID) | ORAL | 0 refills | Status: DC | PRN
Start: 2023-06-17 — End: 2023-06-29
  Filled 2023-06-17: qty 15, 5d supply, fill #0

## 2023-06-17 NOTE — Telephone Encounter (Signed)
Patient states daughter came to pick up RX but was not ready he would like a call to clarify.

## 2023-06-17 NOTE — Telephone Encounter (Signed)
I sent Rx for Nexlizet on Monday and confirmed with pharmacy that it was filled and picked up yesterday.  The pharmacy - [facility] is report that received an Rx for hydrocodone/acetaminophen today from Dr [name] and they are working on it.  Called patient. He thought that Rx his daughter was to pick up was the hydrocodone/acetaminophen and was upset it was not filled yesterday. Explained that PCP OK'd Rx today and that she has been OOO last week. Patient understands.

## 2023-06-19 ENCOUNTER — Other Ambulatory Visit: Payer: Self-pay | Admitting: Family Medicine

## 2023-06-19 DIAGNOSIS — M4802 Spinal stenosis, cervical region: Secondary | ICD-10-CM

## 2023-06-22 ENCOUNTER — Other Ambulatory Visit: Payer: Self-pay | Admitting: Family Medicine

## 2023-06-22 ENCOUNTER — Other Ambulatory Visit (HOSPITAL_BASED_OUTPATIENT_CLINIC_OR_DEPARTMENT_OTHER): Payer: Self-pay

## 2023-06-22 DIAGNOSIS — G8929 Other chronic pain: Secondary | ICD-10-CM

## 2023-06-22 MED ORDER — TIZANIDINE HCL 4 MG PO TABS
4.0000 mg | ORAL_TABLET | Freq: Four times a day (QID) | ORAL | 1 refills | Status: DC | PRN
Start: 2023-06-22 — End: 2023-07-23
  Filled 2023-06-22: qty 30, 8d supply, fill #0
  Filled 2023-07-06: qty 30, 8d supply, fill #1

## 2023-06-23 ENCOUNTER — Other Ambulatory Visit (HOSPITAL_BASED_OUTPATIENT_CLINIC_OR_DEPARTMENT_OTHER): Payer: Self-pay

## 2023-06-25 ENCOUNTER — Other Ambulatory Visit (HOSPITAL_BASED_OUTPATIENT_CLINIC_OR_DEPARTMENT_OTHER): Payer: Self-pay

## 2023-06-25 ENCOUNTER — Other Ambulatory Visit: Payer: Self-pay | Admitting: Family Medicine

## 2023-06-25 DIAGNOSIS — M4802 Spinal stenosis, cervical region: Secondary | ICD-10-CM

## 2023-06-25 DIAGNOSIS — G5600 Carpal tunnel syndrome, unspecified upper limb: Secondary | ICD-10-CM | POA: Diagnosis not present

## 2023-06-25 DIAGNOSIS — M542 Cervicalgia: Secondary | ICD-10-CM | POA: Diagnosis not present

## 2023-06-25 DIAGNOSIS — M5412 Radiculopathy, cervical region: Secondary | ICD-10-CM | POA: Diagnosis not present

## 2023-06-26 ENCOUNTER — Encounter: Payer: Self-pay | Admitting: Podiatry

## 2023-06-26 ENCOUNTER — Ambulatory Visit (INDEPENDENT_AMBULATORY_CARE_PROVIDER_SITE_OTHER): Payer: Medicare HMO | Admitting: Podiatry

## 2023-06-26 DIAGNOSIS — E1140 Type 2 diabetes mellitus with diabetic neuropathy, unspecified: Secondary | ICD-10-CM

## 2023-06-26 DIAGNOSIS — E119 Type 2 diabetes mellitus without complications: Secondary | ICD-10-CM | POA: Diagnosis not present

## 2023-06-26 NOTE — Progress Notes (Signed)
  Subjective:  Patient ID: Ronald Petersen, male    DOB: 1956/10/02,   MRN: [id number]  Chief Complaint  Patient presents with   Diabetes    Patient state he has neuropathy    67 y.o. male presents for follow-up of peripheral neuropathy. c Relates burning and tingling and numbness in their feet. He has been on gabapentin 800 mg 3 times daily.  Patient is diabetic and last A1c was  Lab Results  Component Value Date   HGBA1C 7.2 (H) 03/30/2023   . Requesting new pair of shoes.   PCP:  [name], Grayling Congress, DO    . Denies any other pedal complaints. Denies n/v/f/c.   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 CFT <3 seconds. Absent hair growth on digits. Edema noted to bilateral lower extremities. Xerosis noted bilaterally.  Skin. No lacerations or abrasions bilateral feet. Nails 1-5 bilateral  are normal in appearance. Hyperkeratosis noted to distal right hallux Musculoskeletal: MMT 5/5 bilateral lower extremities in DF, PF, Inversion and Eversion. Deceased ROM in DF of ankle joint.  Neurological: Sensation intact to light touch. Protective sensation diminished bilateral.    Assessment:   1. Type 2 diabetes mellitus with diabetic neuropathy, without long-term current use of insulin (HCC)   2. Encounter for  diabetic foot exam (HCC)      Plan:  Patient was evaluated and treated and all questions answered. -Discussed and educated patient on diabetic foot care, especially with  regards to the vascular, neurological and musculoskeletal systems.  -Stressed the importance of good glycemic control and the detriment of not  controlling glucose levels in relation to the foot. -Discussed supportive shoes at all times and checking feet regularly.  -Mechanically debrided hyperkeratosis without incident as courtesy.  -DM shoe appointment made.  -Answered all patient questions -Patient to return  in 1 year for DM foot check.  -Patient advised to call the office if any problems or questions arise in the meantime.   Louann Sjogren, DPM

## 2023-06-28 ENCOUNTER — Other Ambulatory Visit: Payer: Self-pay | Admitting: Family Medicine

## 2023-06-28 DIAGNOSIS — G4700 Insomnia, unspecified: Secondary | ICD-10-CM

## 2023-06-28 DIAGNOSIS — E785 Hyperlipidemia, unspecified: Secondary | ICD-10-CM

## 2023-06-28 DIAGNOSIS — I10 Essential (primary) hypertension: Secondary | ICD-10-CM

## 2023-06-28 DIAGNOSIS — M4802 Spinal stenosis, cervical region: Secondary | ICD-10-CM

## 2023-06-28 DIAGNOSIS — E119 Type 2 diabetes mellitus without complications: Secondary | ICD-10-CM

## 2023-06-29 ENCOUNTER — Other Ambulatory Visit (HOSPITAL_BASED_OUTPATIENT_CLINIC_OR_DEPARTMENT_OTHER): Payer: Self-pay

## 2023-06-29 ENCOUNTER — Other Ambulatory Visit: Payer: Self-pay | Admitting: Family Medicine

## 2023-06-29 DIAGNOSIS — M4802 Spinal stenosis, cervical region: Secondary | ICD-10-CM

## 2023-06-29 MED ORDER — TRAZODONE HCL 50 MG PO TABS
50.0000 mg | ORAL_TABLET | Freq: Every evening | ORAL | 1 refills | Status: DC | PRN
Start: 2023-06-29 — End: 2024-02-24
  Filled 2023-06-29 – 2023-09-11 (×4): qty 90, 90d supply, fill #0
  Filled 2023-12-01: qty 90, 90d supply, fill #1

## 2023-06-30 ENCOUNTER — Other Ambulatory Visit (HOSPITAL_BASED_OUTPATIENT_CLINIC_OR_DEPARTMENT_OTHER): Payer: Self-pay

## 2023-06-30 MED ORDER — HYDROCODONE-ACETAMINOPHEN 5-325 MG PO TABS
1.0000 | ORAL_TABLET | Freq: Three times a day (TID) | ORAL | 0 refills | Status: AC | PRN
Start: 2023-06-30 — End: ?
  Filled 2023-06-30: qty 15, 5d supply, fill #0

## 2023-06-30 NOTE — Telephone Encounter (Signed)
Requesting: hydrocodone  Contract: 03/12/21 UDS: 06/05/23 Last Visit: 06/05/23 Next Visit: None Last Refill: 06/17/23 #15 and 0RF  Please Advise

## 2023-07-07 ENCOUNTER — Other Ambulatory Visit (HOSPITAL_BASED_OUTPATIENT_CLINIC_OR_DEPARTMENT_OTHER): Payer: Self-pay

## 2023-07-08 ENCOUNTER — Other Ambulatory Visit: Payer: Self-pay | Admitting: Family Medicine

## 2023-07-08 DIAGNOSIS — M4802 Spinal stenosis, cervical region: Secondary | ICD-10-CM

## 2023-07-08 NOTE — Therapy (Signed)
OUTPATIENT PHYSICAL THERAPY CERVICAL EVALUATION   Patient Name: Ronald Petersen MRN: [id number] DOB:Jul 22, 1956, 67 y.o., male Today's Date: 07/09/2023  END OF SESSION:  PT End of Session - 07/09/23 0858     Visit Number 1    Number of Visits 12    Date for PT Re-Evaluation 08/20/23    Progress Note Due on Visit 10    PT Start Time 0855    PT Stop Time 0933    PT Time Calculation (min) 38 min    Activity Tolerance Patient tolerated treatment well    Behavior During Therapy WFL for tasks assessed/performed             Past Medical History:  Diagnosis Date   Allergy    Anxiety    Arthritis    CAD (coronary artery disease)    Cancer (HCC)    CHF (congestive heart failure) (HCC)    Complication of anesthesia    aspirated with back surgery at age 9   Depression    Diabetes mellitus Type II    GERD (gastroesophageal reflux disease)    Hyperlipidemia    Hypertension    Mild aortic stenosis    Mild carotid artery disease (HCC)    Neuromuscular disorder (HCC)    NEUROPATHY   Plavix resistance    abnormal cytochrome P450 2c19 genotype in 2021 inferring suboptimal Plavix platelet inhibition,   Right rotator cuff tear 11/23/2018   Sleep apnea    no CPAP   Past Surgical History:  Procedure Laterality Date   APPENDECTOMY     ARTHOSCOPIC ROTAOR CUFF REPAIR Right 11/23/2018   Procedure: ARTHROSCOPIC ROTATOR CUFF REPAIR;  Surgeon: Teryl Lucy, MD;  Location: [facility];  Service: Orthopedics;  Laterality: Right;   CARDIAC VALVE REPLACEMENT     CHOLECYSTECTOMY     CORONARY ARTERY BYPASS GRAFT N/A 12/24/2021   x2 LIMA to LAD; SVG to Obtuse Marginal   CORONARY STENT INTERVENTION N/A 11/19/2020   Procedure: CORONARY STENT INTERVENTION;  Surgeon: Yvonne Kendall, MD;  Location: [facility];  Service: Cardiovascular;  Laterality: N/A;   CORONARY ULTRASOUND/IVUS N/A 11/19/2020   Procedure: Intravascular Ultrasound/IVUS;  Surgeon: Yvonne Kendall, MD;   Location: [facility];  Service: Cardiovascular;  Laterality: N/A;   ENDOVEIN HARVEST OF GREATER SAPHENOUS VEIN Right 12/24/2021   Procedure: ENDOVEIN HARVEST OF GREATER SAPHENOUS VEIN;  Surgeon: Corliss Skains, MD;  Location: [facility] OR;  Service: Open Heart Surgery;  Laterality: Right;   FRACTURE SURGERY     IR THORACENTESIS ASP PLEURAL SPACE W/IMG GUIDE  01/31/2022   LEFT HEART CATH AND CORONARY ANGIOGRAPHY N/A 11/19/2020   Procedure: LEFT HEART CATH AND CORONARY ANGIOGRAPHY;  Surgeon: Yvonne Kendall, MD;  Location: [facility];  Service: Cardiovascular;  Laterality: N/A;   LEFT HEART CATH AND CORONARY ANGIOGRAPHY N/A 12/24/2020   Procedure: LEFT HEART CATH AND CORONARY ANGIOGRAPHY;  Surgeon: Swazil[place], Peter M, MD;  Location: [facility] INVAS[facility] CV LAB;  Service: Cardiovascular;  Laterality: N/A;   LEFT HEART CATH AND CORONARY ANGIOGRAPHY N/A 12/16/2021   Procedure: LEFT HEART CATH AND CORONARY ANGIOGRAPHY;  Surgeon: Marykay Lex, MD;  Location: [facility] INVASIVE CV LAB;  Service: Cardiovascular;  Laterality: N/A;   LEFT HEART CATH AND CORS/GRAFTS ANGIOGRAPHY N/A 03/18/2023   Procedure: LEFT HEART CATH AND CORS/GRAFTS ANGIOGRAPHY;  Surgeon: Corky Crafts, MD;  Location: [facility] INVASIVE CV LAB;  Service: Cardiovascular;  Laterality: N/A;   LUMBAR LAMINECTOMY     SHOULDER  ARTHROSCOPY WITH ROTATOR CUFF REPAIR AND SUBACROMIAL DECOMPRESSION Right 11/23/2018   Procedure: SHOULDER ARTHROSCOPY WITH ROTATOR CUFF REPAIR AND SUBACROMIAL DECOMPRESSION;  Surgeon: Teryl Lucy, MD;  Location:  SURGERY CENTER;  Service: Orthopedics;  Laterality: Right;   SPINE SURGERY     TEE WITHOUT CARDIOVERSION N/A 12/24/2021   Procedure: TRANSESOPHAGEAL ECHOCARDIOGRAM (TEE);  Surgeon: Corliss Skains, MD;  Location: [facility] OR;  Service: Open Heart Surgery;  Laterality: N/A;   Patient Active Problem List   Diagnosis Date Noted   Abdominal pain 03/18/2023   Capsulitis of left shoulder 03/12/2023    Lumbar radiculopathy 08/19/2022   Suspicious nevus 08/15/2022   Bulge of lumbar disc without myelopathy 08/15/2022   Cervical radiculopathy 08/04/2022   Nocturia 06/03/2022   Atypical chest pain 01/25/2022   Pleural effusion 01/24/2022   S/P CABG x 2 12/24/2021   Coronary artery disease involving native coronary artery of native heart with unstable angina pectoris (HCC) 12/16/2021   Chronic pain syndrome 06/07/2021   Mild neurocognitive disorder due to multiple etiologies 05/10/2021   Type II diabetes mellitus with hypoglycemia (HCC) 01/22/2021   Type 2 diabetes mellitus with diabetic polyneuropathy, without long-term current use of insulin (HCC) 01/22/2021   Metabolic syndrome 01/17/2021   Coronary artery disease 01/17/2021   Anxiety    Arthritis    Cancer (HCC)    Complication of anesthesia    GERD (gastroesophageal reflux disease)    Hyperlipidemia    Hypertension    Neuromuscular disorder (HCC)    Sleep apnea    Coronary artery disease involving native coronary artery of native heart with angina pectoris (HCC) 11/20/2020   CAD S/P DES PCI LAD & LCx-OM 11/20/2020   Unstable angina (HCC) 11/19/2020   Abnormal cardiac CT angiography 11/19/2020   Shortness of breath 10/02/2020   Dizziness 10/02/2020   Chest pain 10/02/2020   Obesity (BMI 30-39.9) 10/02/2020   Type 2 diabetes mellitus with diabetic neuropathy, without long-term current use of insulin (HCC) 06/06/2020   Uncontrolled type 2 diabetes mellitus with hyperglycemia (HCC) 05/31/2020   Blurry vision 02/07/2020   Spinal stenosis of lumbar region 02/07/2020   Frequent falls 02/07/2020   Foraminal stenosis of cervical region 07/26/2019   Aortic atherosclerosis (HCC) 05/27/2019   Chronic bilateral low back pain without sciatica 05/20/2019   Carpal tunnel syndrome, bilateral 05/20/2019   Weakness of both lower extremities 01/05/2019   Falling episodes 01/05/2019   Right rotator cuff tear 11/23/2018   Neck pain 11/04/2018    Acute pain of right shoulder 11/04/2018   Gastroesophageal reflux disease 11/04/2018   Hyperlipidemia associated with type 2 diabetes mellitus (HCC) 11/04/2018   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 06/28/2018   Essential hypertension 09/14/2017   Preventative health care 06/12/2016   OSA (obstructive sleep apnea) 04/07/2016   Joint pain 04/05/2016   NSAID long-term use 12/13/2015   Nausea 12/13/2015   History of colonic polyps 12/13/2015   Weight loss 12/13/2015   Depression 11/06/2015   Metatarsal deformity 02/20/2015   Equinus deformity of foot, acquired 02/20/2015   Plantar fasciitis, bilateral 02/15/2015   Left wrist injury 06/15/2014   Peripheral neuropathy 04/01/2012   Fatigue 11/05/2010   OTHER SPECIFIED DISORDER OF PENIS 03/26/2010   NAUSEA 03/26/2010   Pain in limb 10/17/2008   NECK PAIN, CHRONIC 11/24/2007   Diabetes mellitus, type II (HCC) 01/27/2007   Hyperlipidemia LDL goal <70 01/27/2007    PCP: Donato Schultz, DO  REFERRING PROVIDER: Donalee Citrin, MD   REFERRING DIAG: M54.2 (ICD-10-CM) - Cervicalgia   THERAPY DIAG:  Cervicalgia  Cramp and spasm  Abnormal posture  Rationale for Evaluation and Treatment: Rehabilitation  ONSET DATE: 15 years ago, chronic  SUBJECTIVE:                                                                                                                                                                                                         SUBJECTIVE STATEMENT: Every time I've gone to get my neck fixed something's happened, had to go get stents and bypass.  In and out of the hospital all the time.  Also have some low back pain too.  Neck pain has been going on 10-15 years ago.  Had low back surgery when I was 26, 6 months later it ruptured again, don't want surgery again, unless I'm crawling.  Blood sugars have been "bouncing" but it was 116 last night when checked.  Sleeps on left side, can't  lay on right side any more due to pain and numbness on R arm.  Hand dominance: Right  PERTINENT HISTORY:  S/p CABG x 2, T2DM, skin cancer, chronic pain, peripheral neuropathy, OSA, R RTC tear & repair, CHF, s/p lumbar laminectomy, unstable angina, GERD, bil CTS  PAIN:  Are you having pain? Yes: NPRS scale: 4-5/10 Pain location: neck, but both arms and hands go numb, right worse Pain description: R hand numb and tingling, neck is sharp, shooting pain into chest, aching, headaches,   Aggravating factors: doing things, moving arms Relieving factors: drugs  PRECAUTIONS: Other: unstable angina.  Does not have nitroglycerin.   WEIGHT BEARING RESTRICTIONS: No  FALLS:  Has patient fallen in last 6 months?  None but he frequently stumbles and catches himself  reports he feels clumsy and not getting blood to brain.   LIVING ENVIRONMENT: Lives with: lives with their spouse Lives in: House/apartment Stairs: Yes: Internal: 1 steps; none and counter as step from living room to kitchen Has following equipment at home: Single point cane, Environmental consultant - 2 wheeled, Wheelchair (manual), and Tour manager  OCCUPATION: retired  PLOF: Independent and Leisure: has workshop, stays active, small garden  PATIENT GOALS: decrease neck pain, avoid surgery  NEXT MD VISIT: supposed to return after PT  OBJECTIVE:   DIAGNOSTIC FINDINGS:  MR Cervical spine ordered  PATIENT SURVEYS:  NDI 22/50 = 44% impairment  COGNITION: Overall cognitive status: Within functional limits for tasks assessed  SENSATION: Light touch: Impaired  diminished along R C5/C6 dermatome compared to L  POSTURE:  forward head and decreased lumbar lordosis  PALPATION: Tightness/tenderness throughout bil UT, levator scapulae, cervical paraspinals   CERVICAL ROM:   Active ROM A/PROM (deg) eval  Flexion 45  Extension 34  Right lateral flexion   Left lateral flexion   Right rotation 55  Left rotation 50   (Blank rows = not  tested)  UPPER EXTREMITY ROM: WNL, symmetric, can reach back of neck and low back bilaterally.    UPPER EXTREMITY MMT:  MMT Right eval Left eval  Shoulder flexion 4 4  Shoulder extension    Shoulder abduction 4+ 4+  Shoulder adduction    Shoulder extension    Shoulder internal rotation 4+ 4+  Shoulder external rotation 3+ 4+  Middle trapezius    Lower trapezius    Elbow flexion 4 4+  Elbow extension 5 5  Wrist flexion 4 4  Wrist extension 4 4  Wrist ulnar deviation    Wrist radial deviation    Wrist pronation    Wrist supination    Grip strength 45lb 35lb   (Blank rows = not tested)  CERVICAL SPECIAL TESTS:  Spurling's test: Negative and Distraction test: Positive  FUNCTIONAL TESTS:  5 times sit to stand: 27.4 seconds, hands on thighs MCTSIB: Condition 1: Avg of 3 trials: 30 sec, Condition 2: Avg of 3 trials: 30 sec, Condition 3: Avg of 3 trials: 30 sec, Condition 4: Avg of 3 trials: 30 sec, and Total Score: 120/120 increased sway with eyes closed   TODAY'S TREATMENT:                                                                                                                              DATE:   07/09/2023 Eval,  Self Care: Findings, POC, fall safety - importance of using shower bench as unsteady when closes eyes, reports difficulty in shower.   PATIENT EDUCATION:  Education details: see self care Person educated: Patient Education method: Explanation Education comprehension: verbalized understanding  HOME EXERCISE PROGRAM: TBD  ASSESSMENT:  CLINICAL IMPRESSION: Ronald Petersen  is a 67 y.o. male who was seen today for physical therapy evaluation and treatment for cervicalgia.  He reports numbness and tingling down both arms and hands, right worse than left, in glove distribution but diminished sensation in C5-6 on R.  Cervical MRI has been ordered  He demonstrates decreased cervical ROM but no increased pain with movements.  Numerous palpable trigger points  throughout cervical paraspinals.  Shoulder ROM good, but bil UE weakness, worse on R, but also has history of R RTC repear.  Grip strength good.  Reports feeling unsteady but able to maintain 30 sec on each position on mCTSIB, although 5x STS score of 27.4 places him at higher risk for falls.  Ronald Petersen would benefit from skilled physical therapy to decrease neck pain and ROM and decrease numbness in R arm in order to improve QOL.    OBJECTIVE IMPAIRMENTS: decreased activity tolerance,  decreased ROM, decreased strength, increased fascial restrictions, increased muscle spasms, impaired UE functional use, postural dysfunction, and pain.   ACTIVITY LIMITATIONS: carrying, lifting, standing, sleeping, and reach over head  PARTICIPATION LIMITATIONS: driving, community activity, and yard work  PERSONAL FACTORS: Age, Time since onset of injury/illness/exacerbation, and 3+ comorbidities: S/p CABG x 2, T2DM, skin cancer, chronic pain, peripheral neuropathy, OSA, R RTC tear & repair, CHF, s/p lumbar laminectomy, unstable angina, GERD, bil CTS  are also affecting patient's functional outcome.   REHAB POTENTIAL: Good  CLINICAL DECISION MAKING: Evolving/moderate complexity  EVALUATION COMPLEXITY: Moderate   GOALS: Goals reviewed with patient? Yes  SHORT TERM GOALS: Target date: 07/23/2023   Patient will be independent with initial HEP.  Baseline: needs Goal status: INITIAL   LONG TERM GOALS: Target date: 08/20/2023   Patient will be independent with advanced/ongoing HEP to improve outcomes and carryover.  Baseline:  Goal status: INITIAL  2.  Patient will report 75% improvement in neck pain to improve QOL.  Baseline:  Goal status: INITIAL  3.  Patient will demonstrate 60 deg cervical rotation bilaterally safety with driving.  Baseline: see objective Goal status: INITIAL  4.  Patient will report more than 7 points improvement on NDI to demonstrate improved functional ability.   Baseline: 22/50 Goal status: INITIAL  5.  Patient will report 50% improvement in R arm tingling/numbness Baseline: decreased sensation C5-6 dermatome Goal status: INITIAL  PLAN:  PT FREQUENCY: 1-2x/week  PT DURATION: 6 weeks  PLANNED INTERVENTIONS: Therapeutic exercises, Therapeutic activity, Neuromuscular re-education, Balance training, Gait training, Patient/Family education, Self Care, Joint mobilization, Dry Needling, Electrical stimulation, Spinal manipulation, Spinal mobilization, Cryotherapy, Moist heat, Taping, Traction, Ultrasound, Manual therapy, and Re-evaluation  PLAN FOR NEXT SESSION: posture exercises, neck stretches, manual therapy, TrDN   Ronald Petersen, PT 07/09/2023, 12:08 PM

## 2023-07-09 ENCOUNTER — Other Ambulatory Visit (HOSPITAL_BASED_OUTPATIENT_CLINIC_OR_DEPARTMENT_OTHER): Payer: Self-pay

## 2023-07-09 ENCOUNTER — Ambulatory Visit: Payer: Medicare HMO | Attending: Neurosurgery | Admitting: Physical Therapy

## 2023-07-09 DIAGNOSIS — R293 Abnormal posture: Secondary | ICD-10-CM | POA: Insufficient documentation

## 2023-07-09 DIAGNOSIS — R252 Cramp and spasm: Secondary | ICD-10-CM | POA: Insufficient documentation

## 2023-07-09 DIAGNOSIS — M542 Cervicalgia: Secondary | ICD-10-CM | POA: Insufficient documentation

## 2023-07-09 MED ORDER — HYDROCODONE-ACETAMINOPHEN 5-325 MG PO TABS
1.0000 | ORAL_TABLET | Freq: Three times a day (TID) | ORAL | 0 refills | Status: DC | PRN
Start: 2023-07-09 — End: 2023-07-20
  Filled 2023-07-09: qty 15, 5d supply, fill #0

## 2023-07-09 NOTE — Telephone Encounter (Signed)
Requesting: hydrocodone  Contract: 03/07/21 UDS: 06/05/23 Last Visit: 06/05/23 Next Visit: None Last Refill: 06/30/23 #15 and 0RF   Please Advise

## 2023-07-10 ENCOUNTER — Other Ambulatory Visit (HOSPITAL_BASED_OUTPATIENT_CLINIC_OR_DEPARTMENT_OTHER): Payer: Self-pay

## 2023-07-13 ENCOUNTER — Other Ambulatory Visit: Payer: Self-pay | Admitting: Family Medicine

## 2023-07-13 ENCOUNTER — Ambulatory Visit: Payer: Medicare HMO | Admitting: Podiatry

## 2023-07-13 DIAGNOSIS — M4802 Spinal stenosis, cervical region: Secondary | ICD-10-CM

## 2023-07-13 DIAGNOSIS — E1140 Type 2 diabetes mellitus with diabetic neuropathy, unspecified: Secondary | ICD-10-CM

## 2023-07-13 NOTE — Progress Notes (Signed)
Patient presents today to measured for  diabetic shoes and insoles.  Patient was measured for 1 pair of diabetic shoes and 3 pairs of foam casted diabetic insoles.   Ht 5'11  Wt 195 Shoe size 11.5 w Shoe type [id number]  Financial form signed   Dr Laury Axon chase treating physician   Re-appointment for regularly scheduled diabetic foot care visits or if they should experience any trouble with the shoes or insoles.

## 2023-07-14 ENCOUNTER — Other Ambulatory Visit (HOSPITAL_BASED_OUTPATIENT_CLINIC_OR_DEPARTMENT_OTHER): Payer: Self-pay

## 2023-07-14 ENCOUNTER — Telehealth: Payer: Self-pay | Admitting: Family Medicine

## 2023-07-14 ENCOUNTER — Ambulatory Visit: Payer: Medicare HMO

## 2023-07-14 ENCOUNTER — Other Ambulatory Visit: Payer: Self-pay

## 2023-07-14 MED ORDER — FAMOTIDINE 20 MG PO TABS
20.0000 mg | ORAL_TABLET | Freq: Every day | ORAL | 1 refills | Status: DC
  Filled 2023-07-14: qty 90, 90d supply, fill #0
  Filled 2023-10-15: qty 90, 90d supply, fill #1

## 2023-07-14 NOTE — Telephone Encounter (Signed)
Pt dropped off paperwork to be completed by PCP. Pt requests to be called when papers are completed and faxed to number on form. Papers placed in PCP's tray in front office.

## 2023-07-15 ENCOUNTER — Ambulatory Visit: Payer: Medicare HMO

## 2023-07-15 ENCOUNTER — Other Ambulatory Visit (HOSPITAL_BASED_OUTPATIENT_CLINIC_OR_DEPARTMENT_OTHER): Payer: Self-pay

## 2023-07-15 DIAGNOSIS — R293 Abnormal posture: Secondary | ICD-10-CM | POA: Diagnosis not present

## 2023-07-15 DIAGNOSIS — M542 Cervicalgia: Secondary | ICD-10-CM | POA: Diagnosis not present

## 2023-07-15 DIAGNOSIS — R252 Cramp and spasm: Secondary | ICD-10-CM

## 2023-07-15 NOTE — Therapy (Addendum)
OUTPATIENT PHYSICAL THERAPY CERVICAL TREATMENT/Discharge   Patient Name: Ronald Petersen MRN: [id number] DOB:December 10, 1956, 67 y.o., male Today's Date: 07/15/2023  END OF SESSION:  PT End of Session - 07/15/23 1425     Visit Number 2    Number of Visits 12    Date for PT Re-Evaluation 08/20/23    Progress Note Due on Visit 10    PT Start Time 1404    PT Stop Time 1445    PT Time Calculation (min) 41 min    Activity Tolerance Patient tolerated treatment well    Behavior During Therapy WFL for tasks assessed/performed              Past Medical History:  Diagnosis Date   Allergy    Anxiety    Arthritis    CAD (coronary artery disease)    Cancer (HCC)    CHF (congestive heart failure) (HCC)    Complication of anesthesia    aspirated with back surgery at age 64   Depression    Diabetes mellitus Type II    GERD (gastroesophageal reflux disease)    Hyperlipidemia    Hypertension    Mild aortic stenosis    Mild carotid artery disease (HCC)    Neuromuscular disorder (HCC)    NEUROPATHY   Plavix resistance    abnormal cytochrome P450 2c19 genotype in 2021 inferring suboptimal Plavix platelet inhibition,   Right rotator cuff tear 11/23/2018   Sleep apnea    no CPAP   Past Surgical History:  Procedure Laterality Date   APPENDECTOMY     ARTHOSCOPIC ROTAOR CUFF REPAIR Right 11/23/2018   Procedure: ARTHROSCOPIC ROTATOR CUFF REPAIR;  Surgeon: Teryl Lucy, MD;  Location: [facility];  Service: Orthopedics;  Laterality: Right;   CARDIAC VALVE REPLACEMENT     CHOLECYSTECTOMY     CORONARY ARTERY BYPASS GRAFT N/A 12/24/2021   x2 LIMA to LAD; SVG to Obtuse Marginal   CORONARY STENT INTERVENTION N/A 11/19/2020   Procedure: CORONARY STENT INTERVENTION;  Surgeon: Yvonne Kendall, MD;  Location: [facility];  Service: Cardiovascular;  Laterality: N/A;   CORONARY ULTRASOUND/IVUS N/A 11/19/2020   Procedure: Intravascular Ultrasound/IVUS;  Surgeon: Yvonne Kendall, MD;  Location: [facility];  Service: Cardiovascular;  Laterality: N/A;   ENDOVEIN HARVEST OF GREATER SAPHENOUS VEIN Right 12/24/2021   Procedure: ENDOVEIN HARVEST OF GREATER SAPHENOUS VEIN;  Surgeon: Corliss Skains, MD;  Location: [facility];  Service: Open Heart Surgery;  Laterality: Right;   FRACTURE SURGERY     IR THORACENTESIS ASP PLEURAL SPACE W/IMG GUIDE  01/31/2022   LEFT HEART CATH AND CORONARY ANGIOGRAPHY N/A 11/19/2020   Procedure: LEFT HEART CATH AND CORONARY ANGIOGRAPHY;  Surgeon: Yvonne Kendall, MD;  Location: [facility];  Service: Cardiovascular;  Laterality: N/A;   LEFT HEART CATH AND CORONARY ANGIOGRAPHY N/A 12/24/2020   Procedure: LEFT HEART CATH AND CORONARY ANGIOGRAPHY;  Surgeon: Swazil[place], Peter M, MD;  Location: [facility] INVASIVE CV LAB;  Service: Cardiovascular;  Laterality: N/A;   LEFT HEART CATH AND CORONARY ANGIOGRAPHY N/A 12/16/2021   Procedure: LEFT HEART CATH AND CORONARY ANGIOGRAPHY;  Surgeon: Marykay Lex, MD;  Location: [facility] INVASIVE CV LAB;  Service: Cardiovascular;  Laterality: N/A;   LEFT HEART CATH AND CORS/GRAFTS ANGIOGRAPHY N/A 03/18/2023   Procedure: LEFT HEART CATH AND CORS/GRAFTS ANGIOGRAPHY;  Surgeon: Corky Crafts, MD;  Location: [facility];  Service: Cardiovascular;  Laterality: N/A;   LUMBAR LAMINECTOMY  SHOULDER ARTHROSCOPY WITH ROTATOR CUFF REPAIR AND SUBACROMIAL DECOMPRESSION Right 11/23/2018   Procedure: SHOULDER ARTHROSCOPY WITH ROTATOR CUFF REPAIR AND SUBACROMIAL DECOMPRESSION;  Surgeon: Teryl Lucy, MD;  Location: [facility];  Service: Orthopedics;  Laterality: Right;   SPINE SURGERY     TEE WITHOUT CARDIOVERSION N/A 12/24/2021   Procedure: TRANSESOPHAGEAL ECHOCARDIOGRAM (TEE);  Surgeon: Corliss Skains, MD;  Location: [facility] OR;  Service: Open Heart Surgery;  Laterality: N/A;   Patient Active Problem List   Diagnosis Date Noted   Abdominal pain 03/18/2023   Capsulitis of left  shoulder 03/12/2023   Lumbar radiculopathy 08/19/2022   Suspicious nevus 08/15/2022   Bulge of lumbar disc without myelopathy 08/15/2022   Cervical radiculopathy 08/04/2022   Nocturia 06/03/2022   Atypical chest pain 01/25/2022   Pleural effusion 01/24/2022   S/P CABG x 2 12/24/2021   Coronary artery disease involving native coronary artery of native heart with unstable angina pectoris (HCC) 12/16/2021   Chronic pain syndrome 06/07/2021   Mild neurocognitive disorder due to multiple etiologies 05/10/2021   Type II diabetes mellitus with hypoglycemia (HCC) 01/22/2021   Type 2 diabetes mellitus with diabetic polyneuropathy, without long-term current use of insulin (HCC) 01/22/2021   Metabolic syndrome 01/17/2021   Coronary artery disease 01/17/2021   Anxiety    Arthritis    Cancer (HCC)    Complication of anesthesia    GERD (gastroesophageal reflux disease)    Hyperlipidemia    Hypertension    Neuromuscular disorder (HCC)    Sleep apnea    Coronary artery disease involving native coronary artery of native heart with angina pectoris (HCC) 11/20/2020   CAD S/P DES PCI LAD & LCx-OM 11/20/2020   Unstable angina (HCC) 11/19/2020   Abnormal cardiac CT angiography 11/19/2020   Shortness of breath 10/02/2020   Dizziness 10/02/2020   Chest pain 10/02/2020   Obesity (BMI 30-39.9) 10/02/2020   Type 2 diabetes mellitus with diabetic neuropathy, without long-term current use of insulin (HCC) 06/06/2020   Uncontrolled type 2 diabetes mellitus with hyperglycemia (HCC) 05/31/2020   Blurry vision 02/07/2020   Spinal stenosis of lumbar region 02/07/2020   Frequent falls 02/07/2020   Foraminal stenosis of cervical region 07/26/2019   Aortic atherosclerosis (HCC) 05/27/2019   Chronic bilateral low back pain without sciatica 05/20/2019   Carpal tunnel syndrome, bilateral 05/20/2019   Weakness of both lower extremities 01/05/2019   Falling episodes 01/05/2019   Right rotator cuff tear 11/23/2018    Neck pain 11/04/2018   Acute pain of right shoulder 11/04/2018   Gastroesophageal reflux disease 11/04/2018   Hyperlipidemia associated with type 2 diabetes mellitus (HCC) 11/04/2018   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 06/28/2018   Essential hypertension 09/14/2017   Preventative health care 06/12/2016   OSA (obstructive sleep apnea) 04/07/2016   Joint pain 04/05/2016   NSAID long-term use 12/13/2015   Nausea 12/13/2015   History of colonic polyps 12/13/2015   Weight loss 12/13/2015   Depression 11/06/2015   Metatarsal deformity 02/20/2015   Equinus deformity of foot, acquired 02/20/2015   Plantar fasciitis, bilateral 02/15/2015   Left wrist injury 06/15/2014   Peripheral neuropathy 04/01/2012   Fatigue 11/05/2010   OTHER SPECIFIED DISORDER OF PENIS 03/26/2010   NAUSEA 03/26/2010   Pain in limb 10/17/2008   NECK PAIN, CHRONIC 11/24/2007   Diabetes mellitus, type II (HCC) 01/27/2007   Hyperlipidemia LDL goal <70 01/27/2007    PCP: Donato Schultz, DO  REFERRING PROVIDER: Donalee Citrin, MD   REFERRING DIAG: M54.2 (ICD-10-CM) - Cervicalgia   THERAPY DIAG:  Cervicalgia  Cramp and spasm  Abnormal posture  Rationale for Evaluation and Treatment: Rehabilitation  ONSET DATE: 15 years ago, chronic  SUBJECTIVE:                                                                                                                                                                                                         SUBJECTIVE STATEMENT: Yesterday helped his son put up a patio cover. Pain not too bad today. Sitting aggravates it the most. Hand dominance: Right  PERTINENT HISTORY:  S/p CABG x 2, T2DM, skin cancer, chronic pain, peripheral neuropathy, OSA, R RTC tear & repair, CHF, s/p lumbar laminectomy, unstable angina, GERD, bil CTS  PAIN:  Are you having pain? Yes: NPRS scale: 4-5/10 Pain location: neck, but both arms and hands go numb,  right worse Pain description: R hand numb and tingling, neck is sharp, shooting pain into chest, aching, headaches,   Aggravating factors: doing things, moving arms Relieving factors: drugs  PRECAUTIONS: Other: unstable angina.  Does not have nitroglycerin.   WEIGHT BEARING RESTRICTIONS: No  FALLS:  Has patient fallen in last 6 months?  None but he frequently stumbles and catches himself  reports he feels clumsy and not getting blood to brain.   LIVING ENVIRONMENT: Lives with: lives with their spouse Lives in: House/apartment Stairs: Yes: Internal: 1 steps; none and counter as step from living room to kitchen Has following equipment at home: Single point cane, Environmental consultant - 2 wheeled, Wheelchair (manual), and Tour manager  OCCUPATION: retired  PLOF: Independent and Leisure: has workshop, stays active, small garden  PATIENT GOALS: decrease neck pain, avoid surgery  NEXT MD VISIT: supposed to return after PT  OBJECTIVE:   DIAGNOSTIC FINDINGS:  MR Cervical spine ordered  PATIENT SURVEYS:  NDI 22/50 = 44% impairment  COGNITION: Overall cognitive status: Within functional limits for tasks assessed  SENSATION: Light touch: Impaired  diminished along R C5/C6 dermatome compared to L  POSTURE: forward head and decreased lumbar lordosis  PALPATION: Tightness/tenderness throughout bil UT, levator scapulae, cervical paraspinals   CERVICAL ROM:   Active ROM A/PROM (deg) eval  Flexion 45  Extension 34  Right lateral flexion   Left lateral flexion   Right rotation 55  Left rotation 50   (Blank rows = not tested)  UPPER EXTREMITY ROM: WNL, symmetric, can reach back of neck and low back bilaterally.    UPPER EXTREMITY MMT:  MMT Right eval Left  eval  Shoulder flexion 4 4  Shoulder extension    Shoulder abduction 4+ 4+  Shoulder adduction    Shoulder extension    Shoulder internal rotation 4+ 4+  Shoulder external rotation 3+ 4+  Middle trapezius    Lower trapezius     Elbow flexion 4 4+  Elbow extension 5 5  Wrist flexion 4 4  Wrist extension 4 4  Wrist ulnar deviation    Wrist radial deviation    Wrist pronation    Wrist supination    Grip strength 45lb 35lb   (Blank rows = not tested)  CERVICAL SPECIAL TESTS:  Spurling's test: Negative and Distraction test: Positive  FUNCTIONAL TESTS:  5 times sit to stand: 27.4 seconds, hands on thighs MCTSIB: Condition 1: Avg of 3 trials: 30 sec, Condition 2: Avg of 3 trials: 30 sec, Condition 3: Avg of 3 trials: 30 sec, Condition 4: Avg of 3 trials: 30 sec, and Total Score: 120/120 increased sway with eyes closed   TODAY'S TREATMENT:                                                                                                                              DATE:  07/15/23 Therapeutic Exercise: to improve strength and mobility.  Demo, verbal and tactile cues throughout for technique.  UBE L1.5 x 3 min each way Seated SNAG extension and rotation x 10 bil Upper trap and levator stretch 2x15 sec each side Thoracic extension in chair arms crossed 5x- pt reports nausea going backward Standing ER bil back to doorframe x 10  Door way pec stretch low 2x15 sec    07/09/2023 Eval,  Self Care: Findings, POC, fall safety - importance of using shower bench as unsteady when closes eyes, reports difficulty in shower.   PATIENT EDUCATION:  Education details: see self care Person educated: Patient Education method: Explanation Education comprehension: verbalized understanding  HOME EXERCISE PROGRAM: Access Code: [email or web address] URL: [email or web address] Date: 07/15/2023 Prepared by: Verta Ellen  Exercises - Cervical Extension AROM with Strap  - 1 x daily - 7 x weekly - 2 sets - 10 reps - Seated Assisted Cervical Rotation with Towel  - 1 x daily - 7 x weekly - 2 sets - 10 reps - Seated Gentle Upper Trapezius Stretch  - 1 x daily - 7 x weekly - 3 sets - 10 reps - 15 sec hold - Gentle Levator Scapulae  Stretch  - 1 x daily - 7 x weekly - 3 sets - 10 reps - 15 sec hold - Shoulder External Rotation and Scapular Retraction with Resistance  - 1 x daily - 7 x weekly - 2 sets - 10 reps  ASSESSMENT:  CLINICAL IMPRESSION: Initiated cervicothoracic flexibility exercises today. Pt showed a good tolerance for the exercises today with no increased pain. He reported some nausea and dizziness with thoracic and cervical extension. Required cues to emphasize pectoral stretch in doorway and to correct posture. He  does show kyphotic posture at rest showing a need for postural strengthening and correction.  Ronald Petersen would benefit from skilled physical therapy to decrease neck pain and ROM and decrease numbness in R arm in order to improve QOL.    OBJECTIVE IMPAIRMENTS: decreased activity tolerance, decreased ROM, decreased strength, increased fascial restrictions, increased muscle spasms, impaired UE functional use, postural dysfunction, and pain.   ACTIVITY LIMITATIONS: carrying, lifting, standing, sleeping, and reach over head  PARTICIPATION LIMITATIONS: driving, community activity, and yard work  PERSONAL FACTORS: Age, Time since onset of injury/illness/exacerbation, and 3+ comorbidities: S/p CABG x 2, T2DM, skin cancer, chronic pain, peripheral neuropathy, OSA, R RTC tear & repair, CHF, s/p lumbar laminectomy, unstable angina, GERD, bil CTS  are also affecting patient's functional outcome.   REHAB POTENTIAL: Good  CLINICAL DECISION MAKING: Evolving/moderate complexity  EVALUATION COMPLEXITY: Moderate   GOALS: Goals reviewed with patient? Yes  SHORT TERM GOALS: Target date: 07/23/2023   Patient will be independent with initial HEP.  Baseline: needs Goal status: IN PROGRESS   LONG TERM GOALS: Target date: 08/20/2023   Patient will be independent with advanced/ongoing HEP to improve outcomes and carryover.  Baseline:  Goal status: IN PROGRESS  2.  Patient will report 75% improvement in  neck pain to improve QOL.  Baseline:  Goal status: IN PROGRESS  3.  Patient will demonstrate 60 deg cervical rotation bilaterally safety with driving.  Baseline: see objective Goal status: IN PROGRESS  4.  Patient will report more than 7 points improvement on NDI to demonstrate improved functional ability.  Baseline: 22/50 Goal status: IN PROGRESS  5.  Patient will report 50% improvement in R arm tingling/numbness Baseline: decreased sensation C5-6 dermatome Goal status: IN PROGRESS  PLAN:  PT FREQUENCY: 1-2x/week  PT DURATION: 6 weeks  PLANNED INTERVENTIONS: Therapeutic exercises, Therapeutic activity, Neuromuscular re-education, Balance training, Gait training, Patient/Family education, Self Care, Joint mobilization, Dry Needling, Electrical stimulation, Spinal manipulation, Spinal mobilization, Cryotherapy, Moist heat, Taping, Traction, Ultrasound, Manual therapy, and Re-evaluation  PLAN FOR NEXT SESSION: posture exercises, neck stretches, manual therapy, TrDN    L , PTA 07/15/2023, 2:46 PM  PHYSICAL THERAPY DISCHARGE SUMMARY  Visits from Start of Care: 2  Current functional level related to goals / functional outcomes: See above   Remaining deficits: See above   Education / Equipment: HEP  Plan: Patient agrees to discharge.  Patient goals were not met. Patient is being discharged due to cancelling remaining visits and not returning to PT after 07/15/2023 due to broken foot.    Jena Gauss, PT  08/07/2023 10:20 AM

## 2023-07-15 NOTE — Telephone Encounter (Signed)
Received. Placed in folder 

## 2023-07-17 ENCOUNTER — Ambulatory Visit (HOSPITAL_COMMUNITY): Payer: Medicare HMO

## 2023-07-17 ENCOUNTER — Ambulatory Visit: Payer: Medicare HMO

## 2023-07-20 ENCOUNTER — Other Ambulatory Visit: Payer: Self-pay | Admitting: Family Medicine

## 2023-07-20 ENCOUNTER — Other Ambulatory Visit (HOSPITAL_BASED_OUTPATIENT_CLINIC_OR_DEPARTMENT_OTHER): Payer: Self-pay

## 2023-07-20 DIAGNOSIS — M4802 Spinal stenosis, cervical region: Secondary | ICD-10-CM

## 2023-07-20 MED ORDER — HYDROCODONE-ACETAMINOPHEN 5-325 MG PO TABS
1.0000 | ORAL_TABLET | Freq: Three times a day (TID) | ORAL | 0 refills | Status: DC | PRN
Start: 2023-07-20 — End: 2023-07-28
  Filled 2023-07-20: qty 15, 5d supply, fill #0

## 2023-07-20 NOTE — Telephone Encounter (Signed)
Requesting: Norco/Vicodin Contract: N/A UDS: N/A Last Visit: 06/05/2023 Next Visit: 06/05/2023 Last Refill: 07/09/2023  Please Advise

## 2023-07-21 ENCOUNTER — Other Ambulatory Visit: Payer: Self-pay | Admitting: Family Medicine

## 2023-07-21 ENCOUNTER — Other Ambulatory Visit (HOSPITAL_BASED_OUTPATIENT_CLINIC_OR_DEPARTMENT_OTHER): Payer: Self-pay

## 2023-07-21 DIAGNOSIS — R110 Nausea: Secondary | ICD-10-CM

## 2023-07-21 MED ORDER — PANTOPRAZOLE SODIUM 40 MG PO TBEC
40.0000 mg | DELAYED_RELEASE_TABLET | Freq: Two times a day (BID) | ORAL | 1 refills | Status: DC
Start: 2023-07-21 — End: 2023-09-11
  Filled 2023-07-21: qty 180, 90d supply, fill #0

## 2023-07-22 NOTE — Telephone Encounter (Signed)
Form faxed with office notes

## 2023-07-23 ENCOUNTER — Other Ambulatory Visit: Payer: Self-pay | Admitting: Family Medicine

## 2023-07-23 ENCOUNTER — Other Ambulatory Visit (HOSPITAL_BASED_OUTPATIENT_CLINIC_OR_DEPARTMENT_OTHER): Payer: Self-pay

## 2023-07-23 DIAGNOSIS — M4802 Spinal stenosis, cervical region: Secondary | ICD-10-CM

## 2023-07-23 DIAGNOSIS — G8929 Other chronic pain: Secondary | ICD-10-CM

## 2023-07-24 ENCOUNTER — Other Ambulatory Visit: Payer: Self-pay

## 2023-07-24 ENCOUNTER — Other Ambulatory Visit (HOSPITAL_BASED_OUTPATIENT_CLINIC_OR_DEPARTMENT_OTHER): Payer: Self-pay

## 2023-07-24 MED ORDER — TIZANIDINE HCL 4 MG PO TABS
4.0000 mg | ORAL_TABLET | Freq: Four times a day (QID) | ORAL | 1 refills | Status: DC | PRN
Start: 2023-07-24 — End: 2023-08-28
  Filled 2023-07-24: qty 30, 8d supply, fill #0
  Filled 2023-08-14: qty 30, 8d supply, fill #1

## 2023-07-27 ENCOUNTER — Other Ambulatory Visit (HOSPITAL_BASED_OUTPATIENT_CLINIC_OR_DEPARTMENT_OTHER): Payer: Self-pay

## 2023-07-28 ENCOUNTER — Ambulatory Visit (HOSPITAL_BASED_OUTPATIENT_CLINIC_OR_DEPARTMENT_OTHER)
Admission: RE | Admit: 2023-07-28 | Discharge: 2023-07-28 | Disposition: A | Discharge status: Home / Self Care | Payer: Medicare HMO | Source: Ambulatory Visit | Attending: Family Medicine | Admitting: Family Medicine

## 2023-07-28 ENCOUNTER — Other Ambulatory Visit (HOSPITAL_BASED_OUTPATIENT_CLINIC_OR_DEPARTMENT_OTHER): Payer: Self-pay

## 2023-07-28 ENCOUNTER — Other Ambulatory Visit: Payer: Self-pay

## 2023-07-28 ENCOUNTER — Ambulatory Visit (INDEPENDENT_AMBULATORY_CARE_PROVIDER_SITE_OTHER): Payer: Medicare HMO | Admitting: Family Medicine

## 2023-07-28 ENCOUNTER — Other Ambulatory Visit: Payer: Self-pay | Admitting: Family Medicine

## 2023-07-28 VITALS — BP 118/70 | HR 57 | Temp 98.3°F | Resp 18 | Ht 71.0 in | Wt 201.6 lb

## 2023-07-28 DIAGNOSIS — M4802 Spinal stenosis, cervical region: Secondary | ICD-10-CM

## 2023-07-28 DIAGNOSIS — M79671 Pain in right foot: Secondary | ICD-10-CM | POA: Insufficient documentation

## 2023-07-28 DIAGNOSIS — M542 Cervicalgia: Secondary | ICD-10-CM | POA: Diagnosis not present

## 2023-07-28 DIAGNOSIS — M5136 Other intervertebral disc degeneration, lumbar region: Secondary | ICD-10-CM

## 2023-07-28 DIAGNOSIS — R29898 Other symptoms and signs involving the musculoskeletal system: Secondary | ICD-10-CM | POA: Diagnosis not present

## 2023-07-28 DIAGNOSIS — M51369 Other intervertebral disc degeneration, lumbar region without mention of lumbar back pain or lower extremity pain: Secondary | ICD-10-CM

## 2023-07-28 DIAGNOSIS — M7731 Calcaneal spur, right foot: Secondary | ICD-10-CM | POA: Diagnosis not present

## 2023-07-28 DIAGNOSIS — M19071 Primary osteoarthritis, right ankle and foot: Secondary | ICD-10-CM | POA: Diagnosis not present

## 2023-07-28 MED ORDER — HYDROCODONE-ACETAMINOPHEN 5-325 MG PO TABS
1.0000 | ORAL_TABLET | Freq: Three times a day (TID) | ORAL | 0 refills | Status: DC | PRN
Start: 2023-07-28 — End: 2023-08-10
  Filled 2023-07-28: qty 45, 15d supply, fill #0

## 2023-07-28 NOTE — Assessment & Plan Note (Signed)
Probably from low back  Check MRI ls spine

## 2023-07-28 NOTE — Assessment & Plan Note (Signed)
Mri c spine  Pt was unable to do PT due to pain and weakness

## 2023-07-28 NOTE — Progress Notes (Signed)
Established Patient Office Visit  Subjective   Patient ID: Ronald Petersen, male    DOB: 1956/10/12  Age: 67 y.o. MRN: [id number]  Chief Complaint  Patient presents with   Foot Injury    Right foot, last Thursday, pt report swelling and pain. No bruising. Compressor fell on foot    HPI Discussed the use of AI scribe software for clinical note transcription with the patient, who gave verbal consent to proceed.  History of Present Illness   The patient, with a history of diabetes, presents with foot pain after a compressor handle fell across his foot last Thursday. The foot is persistently swollen and painful, particularly with pressure. He denies bruising and is able to walk with minimal discomfort. He expresses concern about the potential impact of his diabetes on the injury.  The patient also reports chronic back pain and weakness, which has been exacerbated by recent physical therapy. He describes the pain as intolerable, leading to difficulty standing, sitting, and even lying down. The pain and weakness are bilateral but worse on the left. He has a history of back surgery 40 years ago.  He also reports numbness and weakness in his hands and arms, which he attributes to a neck issue. He was scheduled for an MRI of the neck, but it was denied by insurance. He expresses frustration with the lack of treatment for his neck and back issues.  The patient also mentions erectile dysfunction, which he attributes to his diabetes and possibly his other medications.      Patient Active Problem List   Diagnosis Date Noted   Abdominal pain 03/18/2023   Capsulitis of left shoulder 03/12/2023   Lumbar radiculopathy 08/19/2022   Suspicious nevus 08/15/2022   Bulge of lumbar disc without myelopathy 08/15/2022   Cervical radiculopathy 08/04/2022   Nocturia 06/03/2022   Atypical chest pain 01/25/2022   Pleural effusion 01/24/2022   S/P CABG x 2 12/24/2021   Coronary artery disease involving  native coronary artery of native heart with unstable angina pectoris (HCC) 12/16/2021   Chronic pain syndrome 06/07/2021   Mild neurocognitive disorder due to multiple etiologies 05/10/2021   Type II diabetes mellitus with hypoglycemia (HCC) 01/22/2021   Type 2 diabetes mellitus with diabetic polyneuropathy, without long-term current use of insulin (HCC) 01/22/2021   Metabolic syndrome 01/17/2021   Coronary artery disease 01/17/2021   Anxiety    Arthritis    Cancer (HCC)    Complication of anesthesia    GERD (gastroesophageal reflux disease)    Hyperlipidemia    Hypertension    Neuromuscular disorder (HCC)    Sleep apnea    Coronary artery disease involving native coronary artery of native heart with angina pectoris (HCC) 11/20/2020   CAD S/P DES PCI LAD & LCx-OM 11/20/2020   Unstable angina (HCC) 11/19/2020   Abnormal cardiac CT angiography 11/19/2020   Shortness of breath 10/02/2020   Dizziness 10/02/2020   Chest pain 10/02/2020   Obesity (BMI 30-39.9) 10/02/2020   Type 2 diabetes mellitus with diabetic neuropathy, without long-term current use of insulin (HCC) 06/06/2020   Uncontrolled type 2 diabetes mellitus with hyperglycemia (HCC) 05/31/2020   Blurry vision 02/07/2020   Spinal stenosis of lumbar region 02/07/2020   Frequent falls 02/07/2020   Foraminal stenosis of cervical region 07/26/2019   Aortic atherosclerosis (HCC) 05/27/2019   Chronic bilateral low back pain without sciatica 05/20/2019   Carpal tunnel syndrome, bilateral 05/20/2019   Weakness of lower extremity 01/05/2019   Falling episodes  01/05/2019   Right rotator cuff tear 11/23/2018   Neck pain 11/04/2018   Acute pain of right shoulder 11/04/2018   Gastroesophageal reflux disease 11/04/2018   Hyperlipidemia associated with type 2 diabetes mellitus (HCC) 11/04/2018   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 06/28/2018   Essential hypertension 09/14/2017   Preventative health care  06/12/2016   OSA (obstructive sleep apnea) 04/07/2016   Joint pain 04/05/2016   NSAID long-term use 12/13/2015   Nausea 12/13/2015   History of colonic polyps 12/13/2015   Weight loss 12/13/2015   Depression 11/06/2015   Metatarsal deformity 02/20/2015   Equinus deformity of foot, acquired 02/20/2015   Plantar fasciitis, bilateral 02/15/2015   Left wrist injury 06/15/2014   Peripheral neuropathy 04/01/2012   Fatigue 11/05/2010   OTHER SPECIFIED DISORDER OF PENIS 03/26/2010   NAUSEA 03/26/2010   Pain in limb 10/17/2008   NECK PAIN, CHRONIC 11/24/2007   Diabetes mellitus, type II (HCC) 01/27/2007   Hyperlipidemia LDL goal <70 01/27/2007   Past Medical History:  Diagnosis Date   Allergy    Anxiety    Arthritis    CAD (coronary artery disease)    Cancer (HCC)    CHF (congestive heart failure) (HCC)    Complication of anesthesia    aspirated with back surgery at age 46   Depression    Diabetes mellitus Type II    GERD (gastroesophageal reflux disease)    Hyperlipidemia    Hypertension    Mild aortic stenosis    Mild carotid artery disease (HCC)    Neuromuscular disorder (HCC)    NEUROPATHY   Plavix resistance    abnormal cytochrome P450 2c19 genotype in 2021 inferring suboptimal Plavix platelet inhibition,   Right rotator cuff tear 11/23/2018   Sleep apnea    no CPAP   Past Surgical History:  Procedure Laterality Date   APPENDECTOMY     ARTHOSCOPIC ROTAOR CUFF REPAIR Right 11/23/2018   Procedure: ARTHROSCOPIC ROTATOR CUFF REPAIR;  Surgeon: Teryl Lucy, MD;  Location: [facility];  Service: Orthopedics;  Laterality: Right;   CARDIAC VALVE REPLACEMENT     CHOLECYSTECTOMY     CORONARY ARTERY BYPASS GRAFT N/A 12/24/2021   x2 LIMA to LAD; SVG to Obtuse Marginal   CORONARY STENT INTERVENTION N/A 11/19/2020   Procedure: CORONARY STENT INTERVENTION;  Surgeon:  Kendall, MD;  Location: [facility];  Service: Cardiovascular;  Laterality: N/A;    CORONARY ULTRASOUND/IVUS N/A 11/19/2020   Procedure: Intravascular Ultrasound/IVUS;  Surgeon:  Kendall, MD;  Location: [facility];  Service: Cardiovascular;  Laterality: N/A;   ENDOVEIN HARVEST OF GREATER SAPHENOUS VEIN Right 12/24/2021   Procedure: ENDOVEIN HARVEST OF GREATER SAPHENOUS VEIN;  Surgeon: Corliss Skains, MD;  Location: [facility] OR;  Service: Open Heart Surgery;  Laterality: Right;   FRACTURE SURGERY     IR THORACENTESIS ASP PLEURAL SPACE W/IMG GUIDE  01/31/2022   LEFT HEART CATH AND CORONARY ANGIOGRAPHY N/A 11/19/2020   Procedure: LEFT HEART CATH AND CORONARY ANGIOGRAPHY;  Surgeon:  Kendall, MD;  Location: [facility];  Service: Cardiovascular;  Laterality: N/A;   LEFT HEART CATH AND CORONARY ANGIOGRAPHY N/A 12/24/2020   Procedure: LEFT HEART CATH AND CORONARY ANGIOGRAPHY;  Surgeon: Swaziland, Peter M, MD;  Location[facility];  Service: Cardiovascular;  Laterality: N/A;   LEFT HEART CATH AND CORONARY ANGIOGRAPHY N/A 12/16/2021   Procedure: LEFT HEART CATH AND CORONARY ANGIOGRAPHY;  Surgeon: Bryan Lemma  W, MD;  Location: [facility];  Service: Cardiovascular;  Laterality: N/A;   LEFT HEART CATH AND CORS/GRAFTS ANGIOGRAPHY N/A 03/18/2023   Procedure: LEFT HEART CATH AND CORS/GRAFTS ANGIOGRAPHY;  Surgeon: Corky Crafts, MD;  Location: [facility] INVASIVE CV LAB;  Service: Cardiovascular;  Laterality: N/A;   LUMBAR LAMINECTOMY     SHOULDER ARTHROSCOPY WITH ROTATOR CUFF REPAIR AND SUBACROMIAL DECOMPRESSION Right 11/23/2018   Procedure: SHOULDER ARTHROSCOPY WITH ROTATOR CUFF REPAIR AND SUBACROMIAL DECOMPRESSION;  Surgeon: Teryl Lucy, MD;  Location: [facility];  Service: Orthopedics;  Laterality: Right;   SPINE SURGERY     TEE WITHOUT CARDIOVERSION N/A 12/24/2021   Procedure: TRANSESOPHAGEAL ECHOCARDIOGRAM (TEE);  Surgeon: Corliss Skains, MD;  Location: [facility] OR;  Service: Open Heart Surgery;  Laterality: N/A;   Social  History   Tobacco Use   Smoking status: Former    Current packs/day: 0.00    Types: Cigarettes    Start date: 12/29/1969    Quit date: 12/29/1985    Years since quitting: 37.6   Smokeless tobacco: Never  Vaping Use   Vaping status: Never Used  Substance Use Topics   Alcohol use: Not Currently   Drug use: No   Social History   Socioeconomic History   Marital status: Married    Spouse name: Erie Noe   Number of children: 2   Years of education: Not on file   Highest education level: GED or equivalent  Occupational History   Occupation: self employed    Associate Professor: UNEMPLOYED  Tobacco Use   Smoking status: Former    Current packs/day: 0.00    Types: Cigarettes    Start date: 12/29/1969    Quit date: 12/29/1985    Years since quitting: 37.6   Smokeless tobacco: Never  Vaping Use   Vaping status: Never Used  Substance and Sexual Activity   Alcohol use: Not Currently   Drug use: No   Sexual activity: Yes    Partners: Female  Other Topics Concern   Not on file  Social History Narrative   Exercise-- 3 days   Pt has hs degree   Right handed   One story home   Drinks caffeine 2 cups in am   Are you right handed or left handed?    Are you currently employed ?    What is your current occupation? retired   Do you live at home alone?   Who lives with you?  wife   What type of home do you live in: 1 story or 2 story?        Social Determinants of Health   Financial Resource Strain: High Risk (07/28/2023)   Overall Financial Resource Strain (CARDIA)    Difficulty of Paying Living Expenses: Very hard  Food Insecurity: Food Insecurity Present (07/28/2023)   Hunger Vital Sign    Worried About Running Out of Food in the Last Year: Often true    Ran Out of Food in the Last Year: Often true  Transportation Needs: No Transportation Needs (07/28/2023)   PRAPARE - Administrator, Civil Service (Medical): No    Lack of Transportation (Non-Medical): No  Physical Activity:  Sufficiently Active (07/28/2023)   Exercise Vital Sign    Days of Exercise per Week: 3 days    Minutes of Exercise per Session: 140 min  Stress: No Stress Concern Present (07/28/2023)   [employer] of Occupational Health - Occupational Stress Questionnaire    Feeling of Stress :  Not at all  Social Connections: Moderately Integrated (01/27/2023)   Social Connection and Isolation Panel [NHANES]    Frequency of Communication with Friends and Family: More than three times a week    Frequency of Social Gatherings with Friends and Family: More than three times a week    Attends Religious Services: More than 4 times per year    Active Member of [employer] or Organizations: No    Attends Banker Meetings: Not on file    Marital Status: Married  Catering manager Violence: Not At Risk (03/17/2023)   Humiliation, Afraid, Rape, and Kick questionnaire    Fear of Current or Ex-Partner: No    Emotionally Abused: No    Physically Abused: No    Sexually Abused: No   Family Status  Relation Name Status   Mother Hazem Kissler Deceased   Father Mac Tegeler Deceased   Sister debbie Deceased at age 64   Sister vicki Alive   Sister lois Deceased   MGM  (Not Specified)   Other  (Not Specified)   Other  (Not Specified)   Other  (Not Specified)   Other  (Not Specified)   Other  (Not Specified)   Other Dawn (Not Specified)   Other Technical brewer (Not Specified)   Other neg hx (Not Specified)   Neg Hx  (Not Specified)  No partnership data on file   Family History  Problem Relation Age of Onset   COPD Mother    Stroke Father    Ovarian cancer Sister    Stomach cancer Sister    Heart disease Sister        MI   Heart disease Sister        MI   Stomach cancer Maternal Grandmother    Alcohol abuse Other    Depression Other    Arthritis Other    Hypertension Other    Coronary artery disease Other    Ovarian cancer Other        neice   Ovarian cancer Other        neice   Colon polyps Neg  Hx    Colon cancer Neg Hx    Allergies  Allergen Reactions   Niacin Anaphylaxis    Flushing - required ED visit      ROS    Objective:     BP 118/70 (BP Location: Left Arm, Patient Position: Sitting, Cuff Size: Normal)   Pulse (!) 57   Temp 98.3 F (36.8 C) (Oral)   Resp 18   Ht 5\' 11"  (1.803 m)   Wt 201 lb 9.6 oz (91.4 kg)   SpO2 98%   BMI 28.12 kg/m  BP Readings from Last 3 Encounters:  07/28/23 118/70  06/05/23 128/78  04/14/23 128/80   Wt Readings from Last 3 Encounters:  07/28/23 201 lb 9.6 oz (91.4 kg)  06/05/23 201 lb 3.2 oz (91.3 kg)  04/14/23 204 lb (92.5 kg)   SpO2 Readings from Last 3 Encounters:  07/28/23 98%  06/05/23 98%  04/14/23 95%      Physical Exam   No results found for any visits on 07/28/23.  Last CBC Lab Results  Component Value Date   WBC 6.2 06/05/2023   HGB 15.7 06/05/2023   HCT 47.6 06/05/2023   MCV 85.0 06/05/2023   MCH 27.9 03/17/2023   RDW 13.6 06/05/2023   PLT 187.0 06/05/2023   Last metabolic panel Lab Results  Component Value Date   GLUCOSE 105 (H) 06/05/2023  NA 137 06/05/2023   K 4.3 06/05/2023   CL 103 06/05/2023   CO2 26 06/05/2023   BUN 27 (H) 06/05/2023   CREATININE 0.85 06/05/2023   GFR 89.65 06/05/2023   CALCIUM 9.5 06/05/2023   PHOS 4.0 01/25/2022   PROT 7.6 06/05/2023   ALBUMIN 4.6 06/05/2023   LABGLOB 2.5 02/18/2022   AGRATIO 1.9 02/18/2022   BILITOT 1.0 06/05/2023   ALKPHOS 105 06/05/2023   AST 17 06/05/2023   ALT 14 06/05/2023   ANIONGAP 8 03/17/2023   Last lipids Lab Results  Component Value Date   CHOL 143 06/05/2023   HDL 38.00 (L) 06/05/2023   LDLCALC 82 06/05/2023   LDLDIRECT 93.0 05/31/2020   TRIG 116.0 06/05/2023   CHOLHDL 4 06/05/2023   Last hemoglobin A1c Lab Results  Component Value Date   HGBA1C 7.2 (H) 03/30/2023   Last thyroid functions Lab Results  Component Value Date   TSH 1.22 06/05/2023   Last vitamin D Lab Results  Component Value Date   VD25OH  37.26 03/30/2023   Last vitamin B12 and Folate Lab Results  Component Value Date   VITAMINB12 482 03/30/2023   FOLATE 10.9 11/05/2010      The 10-year ASCVD risk score (Arnett DK, et al., 2019) is: 23.7%    Assessment & Plan:   Problem List Items Addressed This Visit       Unprioritized   Pain in limb - Primary   Relevant Orders   DG Foot Complete Right (Completed)   NECK PAIN, CHRONIC   Relevant Orders   MR Cervical Spine Wo Contrast   Bulge of lumbar disc without myelopathy   Relevant Orders   MR LUMBAR SPINE W WO CONTRAST   Weakness of lower extremity    Probably from low back  Check MRI ls spine       Relevant Orders   MR LUMBAR SPINE W WO CONTRAST   Foraminal stenosis of cervical region    Mri c spine  Pt was unable to do PT due to pain and weakness       Relevant Medications   HYDROcodone-acetaminophen (NORCO/VICODIN) 5-325 MG tablet  Assessment and Plan    Foot Injury: Sustained injury to foot from falling compressor handle last Thursday. Persistent pain and swelling, but no bruising. Pain on palpation. Possible fracture. -Order foot X-ray. -Provide post-op boot for comfort and support.  Chronic Pain: Reports inadequate pain control with current medication regimen. Pain and weakness in legs and back, interfering with mobility and physical therapy. History of back surgery 40 years ago. -Request MRI of neck and lower back to assess for cause of pain and weakness. -Continue current pain medication and reevaluate after imaging results.  Erectile Dysfunction: Reports ongoing issues, possibly related to diabetes and other medications. -Consider evaluation and treatment options once acute issues are addressed.  Follow-up: Pending results of foot X-ray and approval of requested MRIs.        No follow-ups on file.    Donato Schultz, DO

## 2023-07-29 ENCOUNTER — Ambulatory Visit: Payer: Medicare HMO

## 2023-07-31 ENCOUNTER — Encounter: Payer: Medicare HMO | Admitting: Physical Therapy

## 2023-07-31 NOTE — Chronic Care Management (AMB) (Signed)
Error Please Disregard

## 2023-08-04 ENCOUNTER — Encounter: Payer: Medicare HMO | Admitting: Physical Therapy

## 2023-08-06 ENCOUNTER — Encounter: Payer: Medicare HMO | Admitting: Physical Therapy

## 2023-08-10 ENCOUNTER — Other Ambulatory Visit (HOSPITAL_BASED_OUTPATIENT_CLINIC_OR_DEPARTMENT_OTHER): Payer: Self-pay

## 2023-08-10 ENCOUNTER — Other Ambulatory Visit: Payer: Self-pay | Admitting: Family Medicine

## 2023-08-10 DIAGNOSIS — M4802 Spinal stenosis, cervical region: Secondary | ICD-10-CM

## 2023-08-10 DIAGNOSIS — R110 Nausea: Secondary | ICD-10-CM

## 2023-08-11 ENCOUNTER — Ambulatory Visit (INDEPENDENT_AMBULATORY_CARE_PROVIDER_SITE_OTHER): Payer: Medicare HMO

## 2023-08-11 DIAGNOSIS — E119 Type 2 diabetes mellitus without complications: Secondary | ICD-10-CM

## 2023-08-11 DIAGNOSIS — E1140 Type 2 diabetes mellitus with diabetic neuropathy, unspecified: Secondary | ICD-10-CM

## 2023-08-11 NOTE — Progress Notes (Signed)

## 2023-08-11 NOTE — Telephone Encounter (Signed)
Requesting: [facility] Contract: 06/05/23 UDS: 06/05/23 Last OV: 07/28/23 Next OV: N/A Last Refill: 07/28/23, #45-0 RF Database:   Please advise

## 2023-08-12 ENCOUNTER — Other Ambulatory Visit: Payer: Self-pay

## 2023-08-12 ENCOUNTER — Other Ambulatory Visit (HOSPITAL_BASED_OUTPATIENT_CLINIC_OR_DEPARTMENT_OTHER): Payer: Self-pay

## 2023-08-12 MED ORDER — ONDANSETRON HCL 4 MG PO TABS
4.0000 mg | ORAL_TABLET | Freq: Three times a day (TID) | ORAL | 2 refills | Status: DC | PRN
Start: 2023-08-12 — End: 2023-10-15
  Filled 2023-08-12: qty 20, 7d supply, fill #0
  Filled 2023-09-09: qty 20, 7d supply, fill #1
  Filled 2023-09-29: qty 20, 7d supply, fill #2

## 2023-08-12 MED ORDER — HYDROCODONE-ACETAMINOPHEN 5-325 MG PO TABS
1.0000 | ORAL_TABLET | Freq: Three times a day (TID) | ORAL | 0 refills | Status: DC | PRN
  Filled 2023-08-12: qty 45, 15d supply, fill #0

## 2023-08-13 ENCOUNTER — Encounter: Payer: Medicare HMO | Admitting: Physical Therapy

## 2023-08-14 ENCOUNTER — Other Ambulatory Visit (HOSPITAL_BASED_OUTPATIENT_CLINIC_OR_DEPARTMENT_OTHER): Payer: Self-pay

## 2023-08-20 ENCOUNTER — Encounter: Payer: Medicare HMO | Admitting: Physical Therapy

## 2023-08-24 ENCOUNTER — Telehealth: Payer: Medicare HMO

## 2023-08-24 ENCOUNTER — Telehealth: Payer: Self-pay | Admitting: Pharmacist

## 2023-08-24 NOTE — Telephone Encounter (Signed)
Patient had follow up with Clinical Pharmacist Practitioner regarding medication for hyperlipidemia and medication management.  Unsuccessful outreach to patient. LM on VM with CB# [phone]

## 2023-08-28 ENCOUNTER — Other Ambulatory Visit: Payer: Self-pay | Admitting: Family Medicine

## 2023-08-28 ENCOUNTER — Other Ambulatory Visit (HOSPITAL_BASED_OUTPATIENT_CLINIC_OR_DEPARTMENT_OTHER): Payer: Self-pay

## 2023-08-28 DIAGNOSIS — G8929 Other chronic pain: Secondary | ICD-10-CM

## 2023-08-28 DIAGNOSIS — M4802 Spinal stenosis, cervical region: Secondary | ICD-10-CM

## 2023-08-28 MED ORDER — TIZANIDINE HCL 4 MG PO TABS
4.0000 mg | ORAL_TABLET | Freq: Four times a day (QID) | ORAL | 1 refills | Status: DC | PRN
  Filled 2023-08-28: qty 30, 8d supply, fill #0
  Filled 2023-09-29: qty 30, 8d supply, fill #1

## 2023-08-28 MED ORDER — HYDROCODONE-ACETAMINOPHEN 5-325 MG PO TABS
1.0000 | ORAL_TABLET | Freq: Three times a day (TID) | ORAL | 0 refills | Status: DC | PRN
  Filled 2023-08-28: qty 45, 15d supply, fill #0

## 2023-08-28 NOTE — Telephone Encounter (Signed)
Requesting: hydrocodone 5-325mg   Contract: 06/05/23 UDS: 06/05/23 Last Visit: 07/28/23 Next Visit: None Last Refill: 08/12/23 #45 and 0RF  Pt sig 1 tab q8h prn  Please Advise

## 2023-09-03 ENCOUNTER — Other Ambulatory Visit (HOSPITAL_BASED_OUTPATIENT_CLINIC_OR_DEPARTMENT_OTHER): Payer: Self-pay

## 2023-09-09 ENCOUNTER — Other Ambulatory Visit (HOSPITAL_BASED_OUTPATIENT_CLINIC_OR_DEPARTMENT_OTHER): Payer: Self-pay

## 2023-09-11 ENCOUNTER — Other Ambulatory Visit: Payer: Self-pay | Admitting: Family Medicine

## 2023-09-11 ENCOUNTER — Other Ambulatory Visit (HOSPITAL_BASED_OUTPATIENT_CLINIC_OR_DEPARTMENT_OTHER): Payer: Self-pay

## 2023-09-11 DIAGNOSIS — F32A Depression, unspecified: Secondary | ICD-10-CM

## 2023-09-11 DIAGNOSIS — R110 Nausea: Secondary | ICD-10-CM

## 2023-09-11 MED ORDER — PANTOPRAZOLE SODIUM 40 MG PO TBEC
40.0000 mg | DELAYED_RELEASE_TABLET | Freq: Two times a day (BID) | ORAL | 1 refills | Status: DC
  Filled 2023-09-11 – 2023-10-19 (×2): qty 180, 90d supply, fill #0
  Filled 2024-01-18: qty 180, 90d supply, fill #1

## 2023-09-11 MED ORDER — SERTRALINE HCL 100 MG PO TABS
150.0000 mg | ORAL_TABLET | Freq: Every day | ORAL | 1 refills | Status: DC
Start: 2023-09-11 — End: 2024-03-23
  Filled 2023-09-11 – 2023-09-29 (×2): qty 135, 90d supply, fill #0
  Filled 2023-12-24: qty 135, 90d supply, fill #1

## 2023-09-12 ENCOUNTER — Other Ambulatory Visit: Payer: Self-pay | Admitting: Family Medicine

## 2023-09-12 DIAGNOSIS — M4802 Spinal stenosis, cervical region: Secondary | ICD-10-CM

## 2023-09-14 ENCOUNTER — Other Ambulatory Visit (HOSPITAL_BASED_OUTPATIENT_CLINIC_OR_DEPARTMENT_OTHER): Payer: Self-pay

## 2023-09-14 MED ORDER — HYDROCODONE-ACETAMINOPHEN 5-325 MG PO TABS
1.0000 | ORAL_TABLET | Freq: Three times a day (TID) | ORAL | 0 refills | Status: DC | PRN
  Filled 2023-09-14: qty 45, 15d supply, fill #0

## 2023-09-14 NOTE — Telephone Encounter (Signed)
Requesting: [facility]CO Contract: 06/05/2023 UDS: 06/05/2023 Last OV: 07/28/2023 Next OV: n/a Last Refill: 08/28/23, #45--0 RF Database:   Please advise

## 2023-09-15 ENCOUNTER — Other Ambulatory Visit (HOSPITAL_BASED_OUTPATIENT_CLINIC_OR_DEPARTMENT_OTHER): Payer: Self-pay

## 2023-09-22 ENCOUNTER — Encounter: Payer: Self-pay | Admitting: Neurology

## 2023-09-29 ENCOUNTER — Other Ambulatory Visit: Payer: Self-pay | Admitting: Family

## 2023-09-29 ENCOUNTER — Other Ambulatory Visit: Payer: Self-pay | Admitting: Family Medicine

## 2023-09-29 ENCOUNTER — Other Ambulatory Visit (HOSPITAL_BASED_OUTPATIENT_CLINIC_OR_DEPARTMENT_OTHER): Payer: Self-pay

## 2023-09-29 ENCOUNTER — Other Ambulatory Visit: Payer: Self-pay

## 2023-09-29 DIAGNOSIS — E1169 Type 2 diabetes mellitus with other specified complication: Secondary | ICD-10-CM

## 2023-09-29 DIAGNOSIS — M4802 Spinal stenosis, cervical region: Secondary | ICD-10-CM

## 2023-09-30 ENCOUNTER — Other Ambulatory Visit (HOSPITAL_BASED_OUTPATIENT_CLINIC_OR_DEPARTMENT_OTHER): Payer: Self-pay

## 2023-09-30 MED ORDER — FENOFIBRATE 160 MG PO TABS
160.0000 mg | ORAL_TABLET | Freq: Every day | ORAL | 1 refills | Status: DC
Start: 2023-09-30 — End: 2024-03-25
  Filled 2023-09-30: qty 90, 90d supply, fill #0
  Filled 2023-12-24: qty 90, 90d supply, fill #1

## 2023-10-05 ENCOUNTER — Encounter: Payer: Self-pay | Admitting: Family Medicine

## 2023-10-05 ENCOUNTER — Other Ambulatory Visit (HOSPITAL_BASED_OUTPATIENT_CLINIC_OR_DEPARTMENT_OTHER): Payer: Self-pay

## 2023-10-05 ENCOUNTER — Encounter (HOSPITAL_BASED_OUTPATIENT_CLINIC_OR_DEPARTMENT_OTHER): Payer: Self-pay

## 2023-10-05 ENCOUNTER — Other Ambulatory Visit: Payer: Self-pay | Admitting: Pulmonary Disease

## 2023-10-05 DIAGNOSIS — M4802 Spinal stenosis, cervical region: Secondary | ICD-10-CM

## 2023-10-05 MED ORDER — HYDROCODONE-ACETAMINOPHEN 5-325 MG PO TABS
1.0000 | ORAL_TABLET | Freq: Three times a day (TID) | ORAL | 0 refills | Status: DC | PRN
  Filled 2023-10-05: qty 45, 15d supply, fill #0

## 2023-10-05 NOTE — Telephone Encounter (Signed)
Requesting: [facility]CO Contract: 06/05/2023 UDS: 06/05/23 Last OV: 07/28/23 Next OV: n/a Last Refill: 09/14/2023, #45--0 RF Database:   Please advise

## 2023-10-07 ENCOUNTER — Other Ambulatory Visit (HOSPITAL_BASED_OUTPATIENT_CLINIC_OR_DEPARTMENT_OTHER): Payer: Self-pay

## 2023-10-07 MED ORDER — ANORO ELLIPTA 62.5-25 MCG/ACT IN AEPB
1.0000 | INHALATION_SPRAY | Freq: Every day | RESPIRATORY_TRACT | 1 refills | Status: DC
  Filled 2023-10-07: qty 60, 30d supply, fill #0
  Filled 2023-11-06 – 2023-11-18 (×2): qty 60, 30d supply, fill #1

## 2023-10-13 ENCOUNTER — Telehealth: Payer: Self-pay

## 2023-10-13 NOTE — Patient Outreach (Signed)
Care Management   Outreach Note  10/13/2023 Name: Ronald Petersen MRN: [id number] DOB: 1956-04-22  An unsuccessful telephone outreach was attempted today to contact the patient about Care Management needs.     Follow Up Plan:  A HIPAA compliant phone message was left for the patient providing contact information and requesting a return call.    [facility]  [facility] RN Care Manager Direct Dial: [phone] Website: [email or web address]

## 2023-10-15 ENCOUNTER — Other Ambulatory Visit: Payer: Self-pay

## 2023-10-15 ENCOUNTER — Other Ambulatory Visit: Payer: Self-pay | Admitting: Family Medicine

## 2023-10-15 ENCOUNTER — Other Ambulatory Visit (HOSPITAL_BASED_OUTPATIENT_CLINIC_OR_DEPARTMENT_OTHER): Payer: Self-pay

## 2023-10-15 DIAGNOSIS — R110 Nausea: Secondary | ICD-10-CM

## 2023-10-15 MED ORDER — ONDANSETRON HCL 4 MG PO TABS
4.0000 mg | ORAL_TABLET | Freq: Three times a day (TID) | ORAL | 2 refills | Status: DC | PRN
  Filled 2023-10-15: qty 20, 7d supply, fill #0
  Filled 2023-11-02: qty 20, 7d supply, fill #1
  Filled 2023-12-01: qty 20, 7d supply, fill #2

## 2023-10-19 ENCOUNTER — Other Ambulatory Visit (HOSPITAL_BASED_OUTPATIENT_CLINIC_OR_DEPARTMENT_OTHER): Payer: Self-pay

## 2023-10-19 ENCOUNTER — Other Ambulatory Visit: Payer: Self-pay | Admitting: Family Medicine

## 2023-10-19 ENCOUNTER — Other Ambulatory Visit: Payer: Self-pay

## 2023-10-19 DIAGNOSIS — M4802 Spinal stenosis, cervical region: Secondary | ICD-10-CM

## 2023-10-19 NOTE — Telephone Encounter (Signed)
Requesting: hydrocodone 5-325mg   Contract: 06/05/23 UDS: 06/05/23  Last Visit: 07/28/23 Next Visit: None Last Refill: 10/05/23 #45 and 0RF   Please Advise

## 2023-10-20 ENCOUNTER — Other Ambulatory Visit (HOSPITAL_BASED_OUTPATIENT_CLINIC_OR_DEPARTMENT_OTHER): Payer: Self-pay

## 2023-10-20 MED ORDER — HYDROCODONE-ACETAMINOPHEN 5-325 MG PO TABS
1.0000 | ORAL_TABLET | Freq: Three times a day (TID) | ORAL | 0 refills | Status: DC | PRN
  Filled 2023-10-20: qty 45, 15d supply, fill #0

## 2023-11-02 ENCOUNTER — Other Ambulatory Visit: Payer: Self-pay | Admitting: Family Medicine

## 2023-11-02 DIAGNOSIS — M4802 Spinal stenosis, cervical region: Secondary | ICD-10-CM

## 2023-11-02 DIAGNOSIS — G8929 Other chronic pain: Secondary | ICD-10-CM

## 2023-11-03 ENCOUNTER — Other Ambulatory Visit: Payer: Self-pay

## 2023-11-03 ENCOUNTER — Other Ambulatory Visit (HOSPITAL_BASED_OUTPATIENT_CLINIC_OR_DEPARTMENT_OTHER): Payer: Self-pay

## 2023-11-03 MED ORDER — HYDROCODONE-ACETAMINOPHEN 5-325 MG PO TABS
1.0000 | ORAL_TABLET | Freq: Three times a day (TID) | ORAL | 0 refills | Status: DC | PRN
  Filled 2023-11-03: qty 45, 15d supply, fill #0

## 2023-11-03 MED ORDER — TIZANIDINE HCL 4 MG PO TABS
4.0000 mg | ORAL_TABLET | Freq: Four times a day (QID) | ORAL | 1 refills | Status: DC | PRN
  Filled 2023-11-03: qty 30, 8d supply, fill #0
  Filled 2023-12-14: qty 30, 8d supply, fill #1

## 2023-11-03 NOTE — Telephone Encounter (Signed)
Requesting: hydrocodone 5-325mg  Contract: 06/05/23 UDS: 06/05/23 Last Visit: 07/28/23 Next Visit: None Last Refill: 10/20/23 #45 and 0RF   Please Advise

## 2023-11-09 ENCOUNTER — Other Ambulatory Visit (HOSPITAL_BASED_OUTPATIENT_CLINIC_OR_DEPARTMENT_OTHER): Payer: Self-pay

## 2023-11-17 ENCOUNTER — Other Ambulatory Visit (HOSPITAL_BASED_OUTPATIENT_CLINIC_OR_DEPARTMENT_OTHER): Payer: Self-pay

## 2023-11-18 ENCOUNTER — Other Ambulatory Visit (HOSPITAL_BASED_OUTPATIENT_CLINIC_OR_DEPARTMENT_OTHER): Payer: Self-pay

## 2023-11-25 ENCOUNTER — Other Ambulatory Visit: Payer: Self-pay | Admitting: Family Medicine

## 2023-11-25 DIAGNOSIS — M4802 Spinal stenosis, cervical region: Secondary | ICD-10-CM

## 2023-11-25 NOTE — Telephone Encounter (Signed)
Requesting: hydrocodone 5-325mg   Contract: 06/05/23 UDS: 06/05/23 Last Visit: 07/28/23 Next Visit: None Last Refill: 11/03/23 #45 and 0RF   Please Advise

## 2023-11-30 ENCOUNTER — Other Ambulatory Visit (HOSPITAL_BASED_OUTPATIENT_CLINIC_OR_DEPARTMENT_OTHER): Payer: Self-pay

## 2023-11-30 MED ORDER — HYDROCODONE-ACETAMINOPHEN 5-325 MG PO TABS
1.0000 | ORAL_TABLET | Freq: Three times a day (TID) | ORAL | 0 refills | Status: DC | PRN
  Filled 2023-11-30: qty 45, 15d supply, fill #0

## 2023-12-01 ENCOUNTER — Other Ambulatory Visit (HOSPITAL_BASED_OUTPATIENT_CLINIC_OR_DEPARTMENT_OTHER): Payer: Self-pay

## 2023-12-11 ENCOUNTER — Other Ambulatory Visit: Payer: Self-pay | Admitting: Pulmonary Disease

## 2023-12-14 ENCOUNTER — Other Ambulatory Visit: Payer: Self-pay | Admitting: Family Medicine

## 2023-12-14 ENCOUNTER — Other Ambulatory Visit (HOSPITAL_BASED_OUTPATIENT_CLINIC_OR_DEPARTMENT_OTHER): Payer: Self-pay

## 2023-12-14 DIAGNOSIS — M4802 Spinal stenosis, cervical region: Secondary | ICD-10-CM

## 2023-12-14 MED ORDER — ANORO ELLIPTA 62.5-25 MCG/ACT IN AEPB
1.0000 | INHALATION_SPRAY | Freq: Every day | RESPIRATORY_TRACT | 1 refills | Status: DC
  Filled 2023-12-14: qty 60, 60d supply, fill #0
  Filled 2024-02-09: qty 60, 60d supply, fill #1

## 2023-12-15 ENCOUNTER — Ambulatory Visit: Payer: Medicare HMO | Admitting: Family Medicine

## 2023-12-15 ENCOUNTER — Other Ambulatory Visit (HOSPITAL_BASED_OUTPATIENT_CLINIC_OR_DEPARTMENT_OTHER): Payer: Self-pay

## 2023-12-15 ENCOUNTER — Other Ambulatory Visit: Payer: Self-pay

## 2023-12-15 VITALS — BP 134/76 | HR 62 | Temp 98.0°F | Resp 18 | Ht 71.0 in | Wt 205.4 lb

## 2023-12-15 DIAGNOSIS — R079 Chest pain, unspecified: Secondary | ICD-10-CM | POA: Diagnosis not present

## 2023-12-15 DIAGNOSIS — R195 Other fecal abnormalities: Secondary | ICD-10-CM

## 2023-12-15 DIAGNOSIS — R1013 Epigastric pain: Secondary | ICD-10-CM | POA: Diagnosis not present

## 2023-12-15 MED ORDER — HYOSCYAMINE SULFATE 0.125 MG SL SUBL: 0.1250 mg | SUBLINGUAL_TABLET | Freq: Once | SUBLINGUAL | Status: DC

## 2023-12-15 MED ORDER — HYOSCYAMINE SULFATE 0.125 MG SL SUBL
0.2500 mg | SUBLINGUAL_TABLET | Freq: Once | SUBLINGUAL | Status: AC
  Administered 2023-12-15: 0.25 mg via SUBLINGUAL

## 2023-12-15 MED ORDER — LIDOCAINE VISCOUS HCL 2 % MT SOLN
15.0000 mL | Freq: Once | OROMUCOSAL | Status: AC
  Administered 2023-12-15: 15 mL via OROMUCOSAL

## 2023-12-15 MED ORDER — FAMOTIDINE 20 MG PO TABS
20.0000 mg | ORAL_TABLET | Freq: Every day | ORAL | 1 refills | Status: DC
  Filled 2023-12-15 – 2024-01-19 (×2): qty 90, 90d supply, fill #0

## 2023-12-15 MED ORDER — HYDROCODONE-ACETAMINOPHEN 5-325 MG PO TABS
1.0000 | ORAL_TABLET | Freq: Three times a day (TID) | ORAL | 0 refills | Status: DC | PRN
  Filled 2023-12-15 – 2023-12-17 (×2): qty 45, 15d supply, fill #0

## 2023-12-15 MED ORDER — ALUM & MAG HYDROXIDE-SIMETH 200-200-20 MG/5ML PO SUSP
30.0000 mL | Freq: Once | ORAL | Status: AC
  Administered 2023-12-15: 30 mL via ORAL

## 2023-12-15 NOTE — Patient Instructions (Signed)
Indigestion Indigestion is a feeling of pain, discomfort, burning, or fullness in the upper part of your abdomen. It can come and go. It may occur frequently or rarely. Indigestion tends to occur while you are eating or right after you have finished eating. It may be worse at night and while bending over or lying down. Indigestion may be a symptom of an underlying digestive condition. Follow these instructions at home: Eating and drinking  Follow an eating plan as recommended by your health care provider. Avoid certain foods and drinks as told by your health care provider. This may include: Chocolate and cocoa. Peppermint and mint flavorings. Garlic and onions. Horseradish. Spicy and acidic foods, including peppers, chili powder, curry powder, vinegar, hot sauces, and barbecue sauce. Citrus fruits, such as oranges, lemons, and limes. Tomato-based foods, such as red sauce, chili, salsa, and pizza with red sauce. Fried and fatty foods, such as donuts, french fries, potato chips, and high-fat dressings. High-fat meats, such as hot dogs and fatty cuts of red and white meats, such as rib eye steak, sausage, ham, and bacon. High-fat dairy items, such as whole milk, butter, and cream cheese. Coffee and tea, with or without caffeine. Drinks that contain alcohol. Energy drinks and sports drinks. Carbonated drinks or sodas. Citrus fruit juices. Eat small, frequent meals instead of large meals. Avoid drinking large amounts of liquid with your meals. Avoid eating meals during the 2-3 hours before bedtime. Avoid lying down right after you eat. Avoid exercise for 2 hours after you eat. Lifestyle     Maintain a healthy weight. Ask your health care provider what weight is healthy for you. If you need to lose weight, work with your health care provider to do so safely. Exercise for at least 30 minutes on 5 or more days each week, or as told by your health care provider. Avoid exercises that include  bending forward. This can make your symptoms worse. Wear loose-fitting clothing. Do not wear anything tight around your waist that causes pressure on your abdomen. Do not use any products that contain nicotine or tobacco. These products include cigarettes, chewing tobacco, and vaping devices, such as e-cigarettes. These can make symptoms worse. If you need help quitting, ask your health care provider. Raise (elevate) the head of your bed about 6 inches (15 cm) when you sleep. You can use a wedge to do this. Try to reduce your stress, such as with yoga or meditation. If you need help reducing stress, ask your health care provider. General instructions Take over-the-counter and prescription medicines only as told by your health care provider. Do not take aspirin or NSAIDs, such as ibuprofen, unless your health care provider told you to take them. Pay attention to any changes in your symptoms. Keep all follow-up visits. This is important. Contact a health care provider if: You have new symptoms. You have unexplained weight loss. You have difficulty swallowing, or it hurts to swallow. Your symptoms do not improve with treatment. Your symptoms last for more than 2 days. You have a fever. You vomit. Get help right away if: You suddenly have pain in your arms, neck, jaw, teeth, or back. You suddenly feel sweaty, dizzy, or light-headed. You faint. You have chest pain or shortness of breath. You cannot stop vomiting, or you vomit blood. Your stool is bloody or black. You have severe pain in your abdomen. These symptoms may represent a serious problem that is an emergency. Do not wait to see if the symptoms will  go away. Get medical help right away. Call your local emergency services (911 in the [place] Do not drive yourself to the hospital. Summary Indigestion is a feeling of pain, discomfort, burning, or fullness in the upper part of your abdomen. It tends to occur while you are eating or right  after you have finished eating. Follow an eating plan and other lifestyle changes as told by your health care provider. Take over-the-counter and prescription medicines only as told by your health care provider. Do not take aspirin or NSAIDs, such as ibuprofen, unless your health care provider told you to do so. Contact your health care provider if your symptoms do not get better or they get worse. Some symptoms may represent a serious problem that is an emergency. Do not wait to see if the symptoms will go away. Get medical help right away. This information is not intended to replace advice given to you by your health care provider. Make sure you discuss any questions you have with your health care provider. Document Revised: 06/20/2020 Document Reviewed: 06/20/2020 Elsevier Patient Education  2024 [employer].

## 2023-12-15 NOTE — Progress Notes (Signed)
Established Patient Office Visit  Subjective   Patient ID: Ronald Petersen, male    DOB: 07-18-1956  Age: 67 y.o. MRN: [id number]  No chief complaint on file.   HPI Discussed the use of AI scribe software for clinical note transcription with the patient, who gave verbal consent to proceed.  History of Present Illness   The patient presents with ongoing gastrointestinal symptoms, including excessive gas, altered bowel habits, and frequent burping. He describes his stools as smaller in size but denies any straining during bowel movements. The patient also reports a constant feeling of nausea. Despite taking pantoprazole twice daily, he continues to experience these symptoms.  In addition to the gastrointestinal issues, the patient reports intermittent chest pain, localized to the left side. He denies any radiation of the pain. The patient also mentions occasional shortness of breath. He has a history of hospitalization in March of the current year, during which he received a coagulant shot that seemed to alleviate his symptoms temporarily.  The patient has a history of diabetes, which could potentially be contributing to his gastrointestinal symptoms. He denies any new medications and reports no coughing. The patient's symptoms appear to be affecting his quality of life significantly, and he expresses frustration with the ongoing discomfort.      Patient Active Problem List   Diagnosis Date Noted   Abdominal pain 03/18/2023   Capsulitis of left shoulder 03/12/2023   Lumbar radiculopathy 08/19/2022   Suspicious nevus 08/15/2022   Bulge of lumbar disc without myelopathy 08/15/2022   Cervical radiculopathy 08/04/2022   Nocturia 06/03/2022   Atypical chest pain 01/25/2022   Pleural effusion 01/24/2022   S/P CABG x 2 12/24/2021   Coronary artery disease involving native coronary artery of native heart with unstable angina pectoris (HCC) 12/16/2021   Chronic pain syndrome 06/07/2021    Mild neurocognitive disorder due to multiple etiologies 05/10/2021   Type II diabetes mellitus with hypoglycemia (HCC) 01/22/2021   Type 2 diabetes mellitus with diabetic polyneuropathy, without long-term current use of insulin (HCC) 01/22/2021   Metabolic syndrome 01/17/2021   Coronary artery disease 01/17/2021   Anxiety    Arthritis    Cancer (HCC)    Complication of anesthesia    GERD (gastroesophageal reflux disease)    Hyperlipidemia    Hypertension    Neuromuscular disorder (HCC)    Sleep apnea    Coronary artery disease involving native coronary artery of native heart with angina pectoris (HCC) 11/20/2020   CAD S/P DES PCI LAD & LCx-OM 11/20/2020   Unstable angina (HCC) 11/19/2020   Abnormal cardiac CT angiography 11/19/2020   Shortness of breath 10/02/2020   Dizziness 10/02/2020   Chest pain 10/02/2020   Obesity (BMI 30-39.9) 10/02/2020   Type 2 diabetes mellitus with diabetic neuropathy, without long-term current use of insulin (HCC) 06/06/2020   Uncontrolled type 2 diabetes mellitus with hyperglycemia (HCC) 05/31/2020   Blurry vision 02/07/2020   Spinal stenosis of lumbar region 02/07/2020   Frequent falls 02/07/2020   Foraminal stenosis of cervical region 07/26/2019   Aortic atherosclerosis (HCC) 05/27/2019   Chronic bilateral low back pain without sciatica 05/20/2019   Carpal tunnel syndrome, bilateral 05/20/2019   Weakness of lower extremity 01/05/2019   Falling episodes 01/05/2019   Right rotator cuff tear 11/23/2018   Neck pain 11/04/2018   Acute pain of right shoulder 11/04/2018   Gastroesophageal reflux disease 11/04/2018   Hyperlipidemia associated with type 2 diabetes mellitus (HCC) 11/04/2018   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 06/28/2018   Essential hypertension 09/14/2017   Preventative health care 06/12/2016   OSA (obstructive sleep apnea) 04/07/2016   Joint pain 04/05/2016   NSAID long-term use 12/13/2015   Nausea  12/13/2015   History of colonic polyps 12/13/2015   Weight loss 12/13/2015   Depression 11/06/2015   Metatarsal deformity 02/20/2015   Equinus deformity of foot, acquired 02/20/2015   Plantar fasciitis, bilateral 02/15/2015   Left wrist injury 06/15/2014   Peripheral neuropathy 04/01/2012   Fatigue 11/05/2010   OTHER SPECIFIED DISORDER OF PENIS 03/26/2010   NAUSEA 03/26/2010   Pain in limb 10/17/2008   NECK PAIN, CHRONIC 11/24/2007   Diabetes mellitus, type II (HCC) 01/27/2007   Hyperlipidemia LDL goal <70 01/27/2007   Past Medical History:  Diagnosis Date   Allergy    Anxiety    Arthritis    CAD (coronary artery disease)    Cancer (HCC)    CHF (congestive heart failure) (HCC)    Complication of anesthesia    aspirated with back surgery at age 40   Depression    Diabetes mellitus Type II    GERD (gastroesophageal reflux disease)    Hyperlipidemia    Hypertension    Mild aortic stenosis    Mild carotid artery disease (HCC)    Neuromuscular disorder (HCC)    NEUROPATHY   Plavix resistance    abnormal cytochrome P450 2c19 genotype in 2021 inferring suboptimal Plavix platelet inhibition,   Right rotator cuff tear 11/23/2018   Sleep apnea    no CPAP   Past Surgical History:  Procedure Laterality Date   APPENDECTOMY     ARTHOSCOPIC ROTAOR CUFF REPAIR Right 11/23/2018   Procedure: ARTHROSCOPIC ROTATOR CUFF REPAIR;  Surgeon: Teryl Lucy, MD;  Location: [facility];  Service: Orthopedics;  Laterality: Right;   CARDIAC VALVE REPLACEMENT     CHOLECYSTECTOMY     CORONARY ARTERY BYPASS GRAFT N/A 12/24/2021   x2 LIMA to LAD; SVG to Obtuse Marginal   CORONARY STENT INTERVENTION N/A 11/19/2020   Procedure: CORONARY STENT INTERVENTION;  Surgeon: Tahni Porchia Kendall, MD;  Location: [facility];  Service: Cardiovascular;  Laterality: N/A;   CORONARY ULTRASOUND/IVUS N/A 11/19/2020   Procedure: Intravascular Ultrasound/IVUS;  Surgeon: Ellison Rieth Kendall, MD;   Location: [facility];  Service: Cardiovascular;  Laterality: N/A;   ENDOVEIN HARVEST OF GREATER SAPHENOUS VEIN Right 12/24/2021   Procedure: ENDOVEIN HARVEST OF GREATER SAPHENOUS VEIN;  Surgeon: Corliss Skains, MD;  Location: [facility] OR;  Service: Open Heart Surgery;  Laterality: Right;   FRACTURE SURGERY     IR THORACENTESIS ASP PLEURAL SPACE W/IMG GUIDE  01/31/2022   LEFT HEART CATH AND CORONARY ANGIOGRAPHY N/A 11/19/2020   Procedure: LEFT HEART CATH AND CORONARY ANGIOGRAPHY;  Surgeon: Malvin Morrish Kendall, MD;  Location: [facility];  Service: Cardiovascular;  Laterality: N/A;   LEFT HEART CATH AND CORONARY ANGIOGRAPHY N/A 12/24/2020   Procedure: LEFT HEART CATH AND CORONARY ANGIOGRAPHY;  Surgeon: Swaziland, Peter M, MD;  Location: [facility] INVASIVE CV LAB;  Service: Cardiovascular;  Laterality: N/A;   LEFT HEART CATH AND CORONARY ANGIOGRAPHY N/A 12/16/2021   Procedure: LEFT HEART CATH AND CORONARY ANGIOGRAPHY;  Surgeon: Marykay Lex, MD;  Location: [facility] INVASIVE CV LAB;  Service: Cardiovascular;  Laterality: N/A;   LEFT HEART CATH AND CORS/GRAFTS ANGIOGRAPHY N/A 03/18/2023   Procedure: LEFT HEART CATH AND CORS/GRAFTS ANGIOGRAPHY;  Surgeon: Corky Crafts, MD;  Location: [facility] INVASIVE  CV LAB;  Service: Cardiovascular;  Laterality: N/A;   LUMBAR LAMINECTOMY     SHOULDER ARTHROSCOPY WITH ROTATOR CUFF REPAIR AND SUBACROMIAL DECOMPRESSION Right 11/23/2018   Procedure: SHOULDER ARTHROSCOPY WITH ROTATOR CUFF REPAIR AND SUBACROMIAL DECOMPRESSION;  Surgeon: Teryl Lucy, MD;  Location: [facility];  Service: Orthopedics;  Laterality: Right;   SPINE SURGERY     TEE WITHOUT CARDIOVERSION N/A 12/24/2021   Procedure: TRANSESOPHAGEAL ECHOCARDIOGRAM (TEE);  Surgeon: Corliss Skains, MD;  Location: [facility] OR;  Service: Open Heart Surgery;  Laterality: N/A;   Social History   Tobacco Use   Smoking status: Former    Current packs/day: 0.00    Types: Cigarettes    Start date:  12/29/1969    Quit date: 12/29/1985    Years since quitting: 37.9   Smokeless tobacco: Never  Vaping Use   Vaping status: Never Used  Substance Use Topics   Alcohol use: Not Currently   Drug use: No   Social History   Socioeconomic History   Marital status: Married    Spouse name: Erie Noe   Number of children: 2   Years of education: Not on file   Highest education level: GED or equivalent  Occupational History   Occupation: self employed    Associate Professor: UNEMPLOYED  Tobacco Use   Smoking status: Former    Current packs/day: 0.00    Types: Cigarettes    Start date: 12/29/1969    Quit date: 12/29/1985    Years since quitting: 37.9   Smokeless tobacco: Never  Vaping Use   Vaping status: Never Used  Substance and Sexual Activity   Alcohol use: Not Currently   Drug use: No   Sexual activity: Yes    Partners: Female  Other Topics Concern   Not on file  Social History Narrative   Exercise-- 3 days   Pt has hs degree   Right handed   One story home   Drinks caffeine 2 cups in am   Are you right handed or left handed?    Are you currently employed ?    What is your current occupation? retired   Do you live at home alone?   Who lives with you?  wife   What type of home do you live in: 1 story or 2 story?        Social Drivers of Health   Financial Resource Strain: High Risk (12/15/2023)   Overall Financial Resource Strain (CARDIA)    Difficulty of Paying Living Expenses: Hard  Food Insecurity: Food Insecurity Present (12/15/2023)   Hunger Vital Sign    Worried About Running Out of Food in the Last Year: Sometimes true    Ran Out of Food in the Last Year: Sometimes true  Transportation Needs: No Transportation Needs (12/15/2023)   PRAPARE - Administrator, Civil Service (Medical): No    Lack of Transportation (Non-Medical): No  Physical Activity: Insufficiently Active (12/15/2023)   Exercise Vital Sign    Days of Exercise per Week: 2 days    Minutes of  Exercise per Session: 20 min  Stress: No Stress Concern Present (12/15/2023)   [employer] of Occupational Health - Occupational Stress Questionnaire    Feeling of Stress : Only a little  Social Connections: Unknown (12/15/2023)   Social Connection and Isolation Panel [NHANES]    Frequency of Communication with Friends and Family: Three times a week    Frequency of Social Gatherings with Friends and Family: Three times a  week    Attends Religious Services: More than 4 times per year    Active Member of Clubs or Organizations: Patient declined    Attends Banker Meetings: Not on file    Marital Status: Married  Intimate Partner Violence: Not At Risk (03/17/2023)   Humiliation, Afraid, Rape, and Kick questionnaire    Fear of Current or Ex-Partner: No    Emotionally Abused: No    Physically Abused: No    Sexually Abused: No   Family Status  Relation Name Status   Mother Johneric Goley Deceased   Father Hanif Fetty Deceased   Sister debbie Deceased at age 5   Sister vicki Alive   Sister lois Deceased   MGM  (Not Specified)   Other  (Not Specified)   Other  (Not Specified)   Other  (Not Specified)   Other  (Not Specified)   Other  (Not Specified)   Other Dawn (Not Specified)   Other Technical brewer (Not Specified)   Other neg hx (Not Specified)   Neg Hx  (Not Specified)  No partnership data on file   Family History  Problem Relation Age of Onset   COPD Mother    Stroke Father    Ovarian cancer Sister    Stomach cancer Sister    Heart disease Sister        MI   Heart disease Sister        MI   Stomach cancer Maternal Grandmother    Alcohol abuse Other    Depression Other    Arthritis Other    Hypertension Other    Coronary artery disease Other    Ovarian cancer Other        neice   Ovarian cancer Other        neice   Colon polyps Neg Hx    Colon cancer Neg Hx    Allergies  Allergen Reactions   Niacin Anaphylaxis    Flushing - required ED visit       Review of Systems  Constitutional:  Negative for fever and malaise/fatigue.  HENT:  Negative for congestion.   Eyes:  Negative for blurred vision.  Respiratory:  Positive for shortness of breath. Negative for cough and wheezing.   Cardiovascular:  Positive for chest pain. Negative for palpitations and leg swelling.  Gastrointestinal:  Positive for abdominal pain and nausea. Negative for blood in stool.  Genitourinary:  Negative for dysuria and frequency.  Musculoskeletal:  Negative for falls.  Skin:  Negative for rash.  Neurological:  Negative for dizziness, loss of consciousness and headaches.  Endo/Heme/Allergies:  Negative for environmental allergies.  Psychiatric/Behavioral:  Negative for depression. The patient is not nervous/anxious.       Objective:     BP 134/76   Pulse 62   Temp 98 F (36.7 C) (Oral)   Resp 18   Ht 5\' 11"  (1.803 m)   Wt 205 lb 6.4 oz (93.2 kg)   SpO2 99%   BMI 28.65 kg/m  BP Readings from Last 3 Encounters:  12/15/23 134/76  07/28/23 118/70  06/05/23 128/78   Wt Readings from Last 3 Encounters:  12/15/23 205 lb 6.4 oz (93.2 kg)  07/28/23 201 lb 9.6 oz (91.4 kg)  06/05/23 201 lb 3.2 oz (91.3 kg)   SpO2 Readings from Last 3 Encounters:  12/15/23 99%  07/28/23 98%  06/05/23 98%      Physical Exam Vitals and nursing note reviewed.  Constitutional:  General: He is not in acute distress.    Appearance: Normal appearance. He is well-developed.  HENT:     Head: Normocephalic and atraumatic.  Eyes:     General: No scleral icterus.       Right eye: No discharge.        Left eye: No discharge.  Cardiovascular:     Rate and Rhythm: Normal rate and regular rhythm.     Heart sounds: No murmur heard. Pulmonary:     Effort: Pulmonary effort is normal. No respiratory distress.     Breath sounds: Normal breath sounds. No wheezing or rales.  Abdominal:     General: There is distension.     Tenderness: There is abdominal tenderness in the  epigastric area. There is no guarding or rebound.  Musculoskeletal:        General: Normal range of motion.     Cervical back: Normal range of motion and neck supple.     Right lower leg: No edema.     Left lower leg: No edema.  Skin:    General: Skin is warm and dry.  Neurological:     Mental Status: He is alert and oriented to person, place, and time.  Psychiatric:        Mood and Affect: Mood normal.        Behavior: Behavior normal.        Thought Content: Thought content normal.        Judgment: Judgment normal.      No results found for any visits on 12/15/23.  Last CBC Lab Results  Component Value Date   WBC 6.2 06/05/2023   HGB 15.7 06/05/2023   HCT 47.6 06/05/2023   MCV 85.0 06/05/2023   MCH 27.9 03/17/2023   RDW 13.6 06/05/2023   PLT 187.0 06/05/2023   Last metabolic panel Lab Results  Component Value Date   GLUCOSE 105 (H) 06/05/2023   NA 137 06/05/2023   K 4.3 06/05/2023   CL 103 06/05/2023   CO2 26 06/05/2023   BUN 27 (H) 06/05/2023   CREATININE 0.85 06/05/2023   GFR 89.65 06/05/2023   CALCIUM 9.5 06/05/2023   PHOS 4.0 01/25/2022   PROT 7.6 06/05/2023   ALBUMIN 4.6 06/05/2023   LABGLOB 2.5 02/18/2022   AGRATIO 1.9 02/18/2022   BILITOT 1.0 06/05/2023   ALKPHOS 105 06/05/2023   AST 17 06/05/2023   ALT 14 06/05/2023   ANIONGAP 8 03/17/2023   Last lipids Lab Results  Component Value Date   CHOL 143 06/05/2023   HDL 38.00 (L) 06/05/2023   LDLCALC 82 06/05/2023   LDLDIRECT 93.0 05/31/2020   TRIG 116.0 06/05/2023   CHOLHDL 4 06/05/2023   Last hemoglobin A1c Lab Results  Component Value Date   HGBA1C 7.2 (H) 03/30/2023   Last thyroid functions Lab Results  Component Value Date   TSH 1.22 06/05/2023   Last vitamin D Lab Results  Component Value Date   VD25OH 37.26 03/30/2023   Last vitamin B12 and Folate Lab Results  Component Value Date   VITAMINB12 482 03/30/2023   FOLATE 10.9 11/05/2010    EKG-- sinus brady, + arifact  The  10-year ASCVD risk score (Arnett DK, et al., 2019) is: 28.7%    Assessment & Plan:   Problem List Items Addressed This Visit       Unprioritized   Chest pain   Relevant Orders   EKG 12-Lead (Completed)   Abdominal pain   Relevant Orders  CBC with Differential/Platelet   Comprehensive metabolic panel   H. pylori antigen, stool   Amylase   Lipase   Other Visit Diagnoses       Dyspepsia    -  Primary   Relevant Medications   famotidine (PEPCID) 20 MG tablet   alum & mag hydroxide-simeth (MAALOX/MYLANTA) 200-200-20 MG/5ML suspension 30 mL (Completed)   lidocaine (XYLOCAINE) 2 % viscous mouth solution 15 mL (Completed)   hyoscyamine (LEVSIN SL) SL tablet 0.25 mg (Completed)   Other Relevant Orders   Ambulatory referral to Gastroenterology   Amylase   Lipase     Change in stool caliber       Relevant Orders   Ambulatory referral to Gastroenterology     Assessment and Plan    Gastroesophageal Reflux Disease (GERD)   Despite taking pantoprazole 40 mg twice daily, he continues to experience persistent heartburn and nausea, accompanied by frequent burping and gas, indicating inadequate control of his GERD. We discussed the risks associated with untreated GERD, such as esophagitis and Barrett's esophagus, and the benefits of adding famotidine for improved symptom control. We will continue pantoprazole 40 mg twice daily, add famotidine 20 mg twice daily, and instruct him to take famotidine two hours apart from pantoprazole.  Chest Pain   He reports intermittent left-sided chest pain without radiation. Previous and current EKGs showed artifacts but no definitive abnormalities. We discussed both potential cardiac and non-cardiac causes of chest pain and the benefits of further evaluation to identify the underlying cause and prevent complications. We will perform an EKG and refer him to Dr. Matt Holmes for further evaluation.  Diabetes Mellitus   He has fluctuating blood sugar levels,  raising concerns about complications that could be affecting his gastrointestinal symptoms. We discussed how diabetes can cause gastroparesis, contributing to his symptoms, and emphasized the importance of blood sugar control to prevent complications. We will order blood work to evaluate his glucose levels and liver function.  General Health Maintenance   We discussed the importance of routine screenings and preventative measures for his general health maintenance. We will order blood tests to check for bacterial infections and liver function.  Follow-up   We will schedule a follow-up appointment with Dr. Matt Holmes and continue to monitor and document his symptoms and medication adherence.        Return if symptoms worsen or fail to improve.    Donato Schultz, DO

## 2023-12-16 ENCOUNTER — Other Ambulatory Visit: Payer: Medicare HMO

## 2023-12-16 ENCOUNTER — Encounter: Payer: Self-pay | Admitting: Family Medicine

## 2023-12-16 ENCOUNTER — Other Ambulatory Visit (HOSPITAL_BASED_OUTPATIENT_CLINIC_OR_DEPARTMENT_OTHER): Payer: Self-pay

## 2023-12-16 DIAGNOSIS — R1013 Epigastric pain: Secondary | ICD-10-CM

## 2023-12-16 LAB — CBC WITH DIFFERENTIAL/PLATELET
Basophils Absolute: 0.1 10*3/uL (ref 0.0–0.1)
Basophils Relative: 1.2 % (ref 0.0–3.0)
Eosinophils Absolute: 0.1 10*3/uL (ref 0.0–0.7)
Eosinophils Relative: 2.2 % (ref 0.0–5.0)
HCT: 44.7 % (ref 39.0–52.0)
Hemoglobin: 14.9 g/dL (ref 13.0–17.0)
Lymphocytes Relative: 29.4 % (ref 12.0–46.0)
Lymphs Abs: 1.9 10*3/uL (ref 0.7–4.0)
MCHC: 33.3 g/dL (ref 30.0–36.0)
MCV: 87.4 fL (ref 78.0–100.0)
Monocytes Absolute: 0.5 10*3/uL (ref 0.1–1.0)
Monocytes Relative: 7.8 % (ref 3.0–12.0)
Neutro Abs: 3.8 10*3/uL (ref 1.4–7.7)
Neutrophils Relative %: 59.4 % (ref 43.0–77.0)
Platelets: 173 10*3/uL (ref 150.0–400.0)
RBC: 5.12 Mil/uL (ref 4.22–5.81)
RDW: 13.7 % (ref 11.5–15.5)
WBC: 6.5 10*3/uL (ref 4.0–10.5)

## 2023-12-16 LAB — COMPREHENSIVE METABOLIC PANEL
ALT: 17 U/L (ref 0–53)
AST: 15 U/L (ref 0–37)
Albumin: 4.5 g/dL (ref 3.5–5.2)
Alkaline Phosphatase: 94 U/L (ref 39–117)
BUN: 22 mg/dL (ref 6–23)
CO2: 28 meq/L (ref 19–32)
Calcium: 9.5 mg/dL (ref 8.4–10.5)
Chloride: 102 meq/L (ref 96–112)
Creatinine, Ser: 0.92 mg/dL (ref 0.40–1.50)
GFR: 85.5 mL/min (ref >60.00)
Glucose, Bld: 128 mg/dL — ABNORMAL HIGH (ref 70–99)
Potassium: 4.2 meq/L (ref 3.5–5.1)
Sodium: 137 meq/L (ref 135–145)
Total Bilirubin: 0.6 mg/dL (ref 0.2–1.2)
Total Protein: 7.1 g/dL (ref 6.0–8.3)

## 2023-12-16 LAB — AMYLASE: Amylase: 31 U/L (ref 27–131)

## 2023-12-16 LAB — LIPASE: Lipase: 13 U/L (ref 11.0–59.0)

## 2023-12-16 NOTE — Progress Notes (Signed)
Specimen drop off

## 2023-12-16 NOTE — Addendum Note (Signed)
Addended by: Mervin Kung A on: 12/16/2023 04:05 PM   Modules accepted: Orders

## 2023-12-16 NOTE — Telephone Encounter (Signed)
Pt notified that he needs to bring in 24 hours after collecting.  Pt will bring in by 4 pm.

## 2023-12-17 ENCOUNTER — Other Ambulatory Visit (HOSPITAL_BASED_OUTPATIENT_CLINIC_OR_DEPARTMENT_OTHER): Payer: Self-pay

## 2023-12-18 LAB — H. PYLORI ANTIGEN, STOOL: H pylori Ag, Stl: NEGATIVE

## 2023-12-24 ENCOUNTER — Other Ambulatory Visit: Payer: Self-pay

## 2023-12-24 ENCOUNTER — Other Ambulatory Visit (HOSPITAL_BASED_OUTPATIENT_CLINIC_OR_DEPARTMENT_OTHER): Payer: Self-pay

## 2023-12-24 ENCOUNTER — Other Ambulatory Visit: Payer: Self-pay | Admitting: Family Medicine

## 2023-12-24 DIAGNOSIS — G8929 Other chronic pain: Secondary | ICD-10-CM

## 2023-12-24 DIAGNOSIS — R110 Nausea: Secondary | ICD-10-CM

## 2023-12-24 MED ORDER — ONDANSETRON HCL 4 MG PO TABS
4.0000 mg | ORAL_TABLET | Freq: Three times a day (TID) | ORAL | 2 refills | Status: DC | PRN
  Filled 2023-12-24: qty 20, 7d supply, fill #0
  Filled 2024-01-29 – 2024-02-16 (×2): qty 20, 7d supply, fill #1
  Filled 2024-03-25: qty 20, 7d supply, fill #2

## 2023-12-24 MED ORDER — GABAPENTIN 800 MG PO TABS
800.0000 mg | ORAL_TABLET | Freq: Three times a day (TID) | ORAL | 1 refills | Status: DC
  Filled 2023-12-24 – 2023-12-28 (×2): qty 270, 90d supply, fill #0

## 2023-12-24 MED ORDER — TIZANIDINE HCL 4 MG PO TABS
4.0000 mg | ORAL_TABLET | Freq: Four times a day (QID) | ORAL | 1 refills | Status: DC | PRN
  Filled 2023-12-24 (×2): qty 30, 8d supply, fill #0
  Filled 2024-01-19: qty 30, 8d supply, fill #1

## 2023-12-25 ENCOUNTER — Encounter: Payer: Self-pay | Admitting: Family Medicine

## 2023-12-28 ENCOUNTER — Other Ambulatory Visit (HOSPITAL_BASED_OUTPATIENT_CLINIC_OR_DEPARTMENT_OTHER): Payer: Self-pay

## 2023-12-29 ENCOUNTER — Other Ambulatory Visit: Payer: Self-pay | Admitting: Family Medicine

## 2023-12-29 DIAGNOSIS — M4802 Spinal stenosis, cervical region: Secondary | ICD-10-CM

## 2023-12-31 ENCOUNTER — Other Ambulatory Visit (HOSPITAL_BASED_OUTPATIENT_CLINIC_OR_DEPARTMENT_OTHER): Payer: Self-pay

## 2023-12-31 ENCOUNTER — Other Ambulatory Visit (HOSPITAL_COMMUNITY): Payer: Self-pay

## 2023-12-31 MED ORDER — HYDROCODONE-ACETAMINOPHEN 5-325 MG PO TABS
1.0000 | ORAL_TABLET | Freq: Three times a day (TID) | ORAL | 0 refills | Status: DC | PRN
  Filled 2023-12-31: qty 45, 15d supply, fill #0

## 2023-12-31 NOTE — Telephone Encounter (Signed)
 Requesting: hydrocodone 5-325mg   Contract: 06/05/23 UDS: 06/05/23 Last Visit: 12/15/23 Next Visit: None Last Refill: 12/15/23 #45 and 0RF   Please Advise

## 2024-01-01 ENCOUNTER — Other Ambulatory Visit (HOSPITAL_BASED_OUTPATIENT_CLINIC_OR_DEPARTMENT_OTHER): Payer: Self-pay

## 2024-01-04 ENCOUNTER — Other Ambulatory Visit (HOSPITAL_BASED_OUTPATIENT_CLINIC_OR_DEPARTMENT_OTHER): Payer: Self-pay

## 2024-01-05 ENCOUNTER — Other Ambulatory Visit (HOSPITAL_BASED_OUTPATIENT_CLINIC_OR_DEPARTMENT_OTHER): Payer: Self-pay

## 2024-01-07 ENCOUNTER — Other Ambulatory Visit (HOSPITAL_BASED_OUTPATIENT_CLINIC_OR_DEPARTMENT_OTHER): Payer: Self-pay

## 2024-01-18 ENCOUNTER — Other Ambulatory Visit (HOSPITAL_BASED_OUTPATIENT_CLINIC_OR_DEPARTMENT_OTHER): Payer: Self-pay

## 2024-01-18 ENCOUNTER — Ambulatory Visit: Payer: Self-pay | Admitting: Family Medicine

## 2024-01-18 NOTE — Telephone Encounter (Signed)
Copied from CRM [id number]. Topic: Clinical - Red Word Triage >> Jan 18, 2024  2:17 PM Melissa C wrote: [facility] that prompted transfer to Nurse Triage: patient's sugar has been very high, is currently taking medication but is not helping. Yesterday reached 235, today reached 300. Patient isn't feeling well, dizzy etc due to sugar issues   Chief Complaint: elevated blood glucose Symptoms: dizziness, hoarseness Frequency: since last Thursday Pertinent Negatives: Patient denies chest pain at time of call, shortness of breath, cold-like symptoms  Disposition: [] ED /[] Urgent Care (no appt availability in office) / [x] Appointment(In office/virtual)/ []  [facility]/ [] Home Care/ [] Refused Recommended Disposition /[] [place] [facility]/ []  Follow-up with PCP Additional Notes: The patient reported that his blood sugar has been elevated since last Thursday.  His usual range is 118-125.  At the time of triage, it was 164.  Yesterday, prior to eating it was 184, then after eating crackers to take his medication, it was 235 and at lunch time it reached 300.  He laid in bed all day yesterday because he felt dizzy and like he was walking in a fog. His voice is hoarse but he denied other cold-like symptoms. His heart rate was 53 and remained in the 50's throughout the day.  He said he usually feels these symptoms when his blood sugar is too low or too high or when his heart rate is low.  He also reported intermittent chest pain that feels like a tugging that lasts 30-40 seconds.  The last time this occurred was last week.  He said, the more he does, the more frequently it occurs.  He reported that his has been ongoing since his open heart surgery 2-3 years ago.   Reason for Disposition  [1] Chest pain lasts < 5 minutes AND [2] NO chest pain or cardiac symptoms (e.g., breathing difficulty, sweating) now  (Exception: Chest pains that last only a few seconds.)  Answer Assessment - Initial Assessment  Questions 1. BLOOD GLUCOSE: "What is your blood glucose level?"      164 2. ONSET: "When did you check the blood glucose?"     Several days - last Thursday  3. USUAL RANGE: "What is your glucose level usually?" (e.g., usual fasting morning value, usual evening value)     118-125 Below 100 I feel just as bad as when 300 4. KETONES: "Do you check for ketones (urine or blood test strips)?" If Yes, ask: "What does the test show now?"      Has not checked  5. TYPE 1 or 2:  "Do you know what type of diabetes you have?"  (e.g., Type 1, Type 2, Gestational; doesn't know)      Type 2  7. DIABETES PILLS: "Do you take any pills for your diabetes?" If Yes, ask: "Have you missed taking any pills recently?"     Jardiance - after open heart surgery  8. OTHER SYMPTOMS: "Do you have any symptoms?" (e.g., fever, frequent urination, difficulty breathing, dizziness, weakness, vomiting)     Dizzy, frequent urination  Answer Assessment - Initial Assessment Questions 1. DESCRIPTION: "Please describe your heart rate or heartbeat that you are having" (e.g., fast/slow, regular/irregular, skipped or extra beats, "palpitations")     Slow  2. ONSET: "When did it start?" (Minutes, hours or days)      All day yesterday  3. DURATION: "How long does it last" (e.g., seconds, minutes, hours)     Remained in the 50's  4. PATTERN "Does it  come and go, or has it been constant since it started?"  "Does it get worse with exertion?"   "Are you feeling it now?"     Remained low  6. HEART RATE: "Can you tell me your heart rate?" "How many beats in 15 seconds?"  (Note: not all patients can do this)       53 7. RECURRENT SYMPTOM: "Have you ever had this before?" If Yes, ask: "When was the last time?" and "What happened that time?"      Ongoing has not seen cardiology recently  8. CAUSE: "What do you think is causing the palpitations?"     Unknown  9. CARDIAC HISTORY: "Do you have any history of heart disease?" (e.g., heart  attack, angina, bypass surgery, angioplasty, arrhythmia)      Stents placed 2-3 years ago   10. OTHER SYMPTOMS: "Do you have any other symptoms?" (e.g., dizziness, chest pain, sweating, difficulty breathing)       Dizzy  Answer Assessment - Initial Assessment Questions 1. LOCATION: "Where does it hurt?"       Feels tugging in chest 2. RADIATION: "Does the pain go anywhere else?" (e.g., into neck, jaw, arms, back)     Has history of neck pain so unsure if related to that or heart 3. ONSET: "When did the chest pain begin?" (Minutes, hours or days)      Intermittent for 2-3 years.  Last occurrence last Thursday 4. PATTERN: "Does the pain come and go, or has it been constant since it started?"  "Does it get worse with exertion?"      Occurs with exertion  5. DURATION: "How long does it last" (e.g., seconds, minutes, hours)     30-40 seconds  7. CARDIAC RISK FACTORS: "Do you have any history of heart problems or risk factors for heart disease?" (e.g., angina, prior heart attack; diabetes, high blood pressure, high cholesterol, smoker, or strong family history of heart disease)     Stents placed 8. PULMONARY RISK FACTORS: "Do you have any history of lung disease?"  (e.g., blood clots in lung, asthma, emphysema, birth control pills)     "Spot on lung that they are waiting 1 year to check" 9. CAUSE: "What do you think is causing the chest pain?"     unsure 10. OTHER SYMPTOMS: "Do you have any other symptoms?" (e.g., dizziness, nausea, vomiting, sweating, fever, difficulty breathing, cough)       none  Protocols used: Diabetes - High Blood Sugar-A-AH, Heart Rate and Heartbeat Questions-A-AH, Chest Pain-A-AH

## 2024-01-19 ENCOUNTER — Ambulatory Visit: Payer: HMO | Admitting: Family Medicine

## 2024-01-19 VITALS — BP 145/74 | HR 63 | Temp 98.2°F | Resp 18 | Wt 200.4 lb

## 2024-01-19 DIAGNOSIS — C801 Malignant (primary) neoplasm, unspecified: Secondary | ICD-10-CM

## 2024-01-19 DIAGNOSIS — J439 Emphysema, unspecified: Secondary | ICD-10-CM

## 2024-01-19 DIAGNOSIS — G709 Myoneural disorder, unspecified: Secondary | ICD-10-CM

## 2024-01-19 DIAGNOSIS — J984 Other disorders of lung: Secondary | ICD-10-CM

## 2024-01-19 DIAGNOSIS — I159 Secondary hypertension, unspecified: Secondary | ICD-10-CM

## 2024-01-19 DIAGNOSIS — E785 Hyperlipidemia, unspecified: Secondary | ICD-10-CM | POA: Diagnosis not present

## 2024-01-19 DIAGNOSIS — R079 Chest pain, unspecified: Secondary | ICD-10-CM

## 2024-01-19 DIAGNOSIS — E1165 Type 2 diabetes mellitus with hyperglycemia: Secondary | ICD-10-CM

## 2024-01-19 DIAGNOSIS — Z9911 Dependence on respirator [ventilator] status: Secondary | ICD-10-CM

## 2024-01-19 DIAGNOSIS — E1140 Type 2 diabetes mellitus with diabetic neuropathy, unspecified: Secondary | ICD-10-CM

## 2024-01-19 DIAGNOSIS — E119 Type 2 diabetes mellitus without complications: Secondary | ICD-10-CM | POA: Diagnosis not present

## 2024-01-19 DIAGNOSIS — Z794 Long term (current) use of insulin: Secondary | ICD-10-CM | POA: Diagnosis not present

## 2024-01-19 DIAGNOSIS — R739 Hyperglycemia, unspecified: Secondary | ICD-10-CM | POA: Diagnosis not present

## 2024-01-19 DIAGNOSIS — R911 Solitary pulmonary nodule: Secondary | ICD-10-CM | POA: Diagnosis not present

## 2024-01-19 DIAGNOSIS — E1169 Type 2 diabetes mellitus with other specified complication: Secondary | ICD-10-CM | POA: Diagnosis not present

## 2024-01-19 LAB — POC URINALSYSI DIPSTICK (AUTOMATED)
Bilirubin, UA: NEGATIVE
Glucose, UA: POSITIVE — AB
Leukocytes, UA: NEGATIVE
Nitrite, UA: NEGATIVE
Protein, UA: NEGATIVE
Spec Grav, UA: 1.015 (ref 1.010–1.025)
Urobilinogen, UA: 0.2 U/dL
pH, UA: 5 (ref 5.0–8.0)

## 2024-01-19 LAB — GLUCOSE, POCT (MANUAL RESULT ENTRY): POC Glucose: 128 mg/dL — AB (ref 70–99)

## 2024-01-19 MED ORDER — TRESIBA FLEXTOUCH 100 UNIT/ML ~~LOC~~ SOPN
10.0000 [IU] | PEN_INJECTOR | Freq: Every day | SUBCUTANEOUS | 2 refills | Status: DC
  Filled 2024-01-19: qty 3, 30d supply, fill #0
  Filled 2024-02-12: qty 3, 30d supply, fill #1
  Filled 2024-03-14: qty 3, 30d supply, fill #2

## 2024-01-19 NOTE — Progress Notes (Signed)
I saw XYLON CROOM in neurology clinic on 01/28/24 in follow up for neuropathy.  HPI: CLEATUS GABRIEL is a 68 y.o. year old male with a history of DM2, HTN, HLD, CAD s/p CABG (11/2021), back surgery (at age 40, cannot remember the lumbar levels), OA, anxiety, right rotator cuff tear, OSA (not on CPAP), and prior EtOH abuse who we last saw on 01/22/23.  To briefly review: 09/05/22: Patient is having shooting pains in his feet shooting into his toes. His left leg is worse. He endorses numbness and tingling that extends to his calves. The tingling in his feet is near constant. He has arthritis in his hands and sometimes has tingling in his hands, but not as bad as legs. He still has significant back and neck pain (see previous history below). When he walks, he looks like a drunk person. He does not use an assistive device. He has not fallen in the last 3-4 months, but has lost balance and fallen this year (he thinks it is less than 5 times this year). He endorses weakness in the legs. He has difficulty going up and down stairs and feels like he trips over his own feet (?foot drop). He thinks most of the falls are related to not feeling his feet.   The patient denies symptoms suggestive of oculobulbar weakness including diplopia, ptosis, dysphagia, poor saliva control, dysarthria/dysphonia, impaired mastication, facial weakness/droop.   The patient does not report symptoms referable to autonomic dysfunction including impaired sweating, heat or cold intolerance, excessive mucosal dryness, gastroparetic early satiety, postprandial abdominal bloating, or syncope/presyncope/orthostatic intolerance. He does endorse some bowel continence. He denies saddle anaesthesia.   The patient denies any constitutional symptoms like fever, night sweats, anorexia or unintentional weight loss.   EtOH use: Patient has not drank EtOH in 2 years. Previously was a heavier drinker (4-5 beers per day) Restrictive diet?  No Family history of neuropathy/myopathy/NM disease? Sister had neuropathy (did not have diabetes)   Patient was seen by PCP, Dr. [name], on 06/03/22 and mentioned neuropathy, leg weakness, and falls. Lumbar xrays were ordered and patient was referred to neurology for further evaluation.   Previously patient has seen Dr. Everlena Cooper in the past (most recently on 02/26/21 for cognitive complaints and in 2020 for leg weakness). Per Dr. Moises Blood clinic note on 06/03/2019: Patient has history of chronic neck and back pain with history of lumbar laminectomy.  He is on multiple medications for chronic pain.  He reports radicular pain down the back of his legs when he walks.  Lumbar spine X-ray from 01/04/19 personally reviewed and demonstrated multilevel arthritic changes with moderate disc space narrowing at L4-5 and L5-S1 as well as facet arthritic changes at L3-4, L4-5 and L5-S1 bilaterally and anterior osteophytes at L4, L5, and S1.  He also reports worsening neck pain as well, radiating down both sides and into his shoulders.  Neck pain is followed by Dr. Ophelia Charter.  Due to chronic neck pain, cervical X-ray from 05/20/19 showed spondylosis most prominent at C5-6 and C6-7.  Over the past year, he reports worsening weakness and pain in the legs.  He also reports weakness in the upper extremities as well.  He has significant numbness in the legs as well as in the hands radiating up the arms.  He was diagnosed with severe carpal tunnel syndrome.  Labs from February include B12 342 and low B1 of 7.  He has a history of alcohol dependence and reports that he was  drinking about 6 beers every other day up until 3 months ago.  Hgb A1c from 05/20/19 was elevated at 9.7.  About a month ago, he developed positional vertigo described as spinning sensation lasting seconds with change in head movement.   Per patient, he has not been cleared by cardiology for neck or back surgery.    Patient is currently on gabapentin 800 mg. He is  prescribed TID, but usually takes it BID, sometimes TID (based on if he remembers). He is not sure if it is helping. Patient is currently on Zoloft for depression (per wife this works well). He is taking B12, but is not sure how much.   He does not remember previously tried medications and does not think he has ever tried topical treatment. He has previously been recommended for PT, but his co-pay and ability to pay has not allowed him to go.   Patient would like to know where the weakness is coming from and if there is anything that can help.  01/22/23: Labs were unremarkable. EMG showed a mild polyneuropathy, severe carpal tunnel syndrome, and an old right C8 radiculopathy. There was no definitive electrodiagnostic evidence of a lumbosacral radiculopathy. Patient gets a lot of pain and numbness/tingling in his hands that was not previously mentioned. A cock up wrist splint was recommended for his carpal tunnel symptoms. He did not wear wrist splints.   PT was recommended but did not feel it would be that helpful, so he did not do PT.   His symptoms are similar to prior. He does feel like he is having trouble picking his legs up. He thinks he walks like "an old man." He denies recent falls and continues to ambulate independently and is independent of ADLs.   He continues to take gabapentin 800 mg BID to TID which he thinks helps with his pains.  Most recent Assessment and Plan (01/22/23): This is AVELINO HERREN, a 68 y.o. 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 Could increase to 900 mg TID, but patient okay with current dose for now -Topical Lidocaine cream over the counter for tingling -Patient prefers to defer PT for now due to cost  Since  their last visit: He is having more trouble lately in both hands. He still has tingling in his feet, but not has troublesome has hands. He has tried the wrist splints but only a few nights. He continues to take gabapentin 800 mg TID. He thinks it helps.  He has some imbalance but not had any falls.  He continues to remain EtOH free. Patient has had trouble controlling glucose. He mentions now it tends to run low.   MEDICATIONS:  Outpatient Encounter Medications as of 01/28/2024  Medication Sig   acetaminophen (TYLENOL) 650 MG CR tablet Take 650 mg by mouth every 8 (eight) hours as needed for pain.   aspirin EC 325 MG EC tablet Take 1 tablet (325 mg total) by mouth daily.   Bempedoic Acid-Ezetimibe (NEXLIZET) 180-10 MG TABS Take 1 tablet by mouth daily. (Replaces ezetimibe 10mg )   ELDERBERRY PO Take 300 mg by mouth daily.   empagliflozin (JARDIANCE) 25 MG TABS tablet Take 1 tablet (25 mg total) by mouth daily before breakfast.   famotidine (PEPCID) 20 MG tablet Take 1 tablet (  20 mg total) by mouth daily.   fenofibrate 160 MG tablet Take 1 tablet (160 mg total) by mouth daily.   gabapentin (NEURONTIN) 800 MG tablet Take 1 tablet (800 mg total) by mouth 3 (three) times daily.   glucose blood (ONETOUCH VERIO) test strip USE AS INSTRUCTED TO CHECK BLOOD SUGAR ONCE A DAY   HYDROcodone-acetaminophen (NORCO/VICODIN) 5-325 MG tablet Take 1 tablet by mouth every 8 (eight) hours as needed.   insulin degludec (TRESIBA FLEXTOUCH) 100 UNIT/ML FlexTouch Pen Inject 10 Units into the skin daily. 10 u sq every day   Insulin Pen Needle (TRUEPLUS 5-BEVEL PEN NEEDLES) 31G X 5 MM MISC Use to inject insulin   Magnesium Citrate 200 MG TABS Take 400 mg by mouth daily.   ondansetron (ZOFRAN) 4 MG tablet Take 1 tablet (4 mg total) by mouth every 8 (eight) hours as needed.   pantoprazole (PROTONIX) 40 MG tablet Take 1 tablet (40 mg total) by mouth 2 (two) times daily.   sertraline (ZOLOFT) 100 MG tablet Take 1.5  tablets (150 mg total) by mouth daily.   tiZANidine (ZANAFLEX) 4 MG tablet Take 1 tablet (4 mg total) by mouth every 6 (six) hours as needed for muscle spasms.   traZODone (DESYREL) 50 MG tablet Take 1 tablet (50 mg total) by mouth at bedtime as needed for sleep.   umeclidinium-vilanterol (ANORO ELLIPTA) 62.5-25 MCG/ACT AEPB Inhale 1 puff into the lungs daily.   vitamin E 180 MG (400 UNITS) capsule Take 1,600 Units by mouth daily.   No facility-administered encounter medications on file as of 01/28/2024.    PAST MEDICAL HISTORY: Past Medical History:  Diagnosis Date   Allergy    Anxiety    Arthritis    CAD (coronary artery disease)    Cancer (HCC)    CHF (congestive heart failure) (HCC)    Complication of anesthesia    aspirated with back surgery at age 57   Depression    Diabetes mellitus Type II    GERD (gastroesophageal reflux disease)    Hyperlipidemia    Hypertension    Mild aortic stenosis    Mild carotid artery disease (HCC)    Neuromuscular disorder (HCC)    NEUROPATHY   Plavix resistance    abnormal cytochrome P450 2c19 genotype in 2021 inferring suboptimal Plavix platelet inhibition,   Right rotator cuff tear 11/23/2018   Sleep apnea    no CPAP    PAST SURGICAL HISTORY: Past Surgical History:  Procedure Laterality Date   APPENDECTOMY     ARTHOSCOPIC ROTAOR CUFF REPAIR Right 11/23/2018   Procedure: ARTHROSCOPIC ROTATOR CUFF REPAIR;  Surgeon: Teryl Lucy, MD;  Location: [facility];  Service: Orthopedics;  Laterality: Right;   CARDIAC VALVE REPLACEMENT     CHOLECYSTECTOMY     CORONARY ARTERY BYPASS GRAFT N/A 12/24/2021   x2 LIMA to LAD; SVG to Obtuse Marginal   CORONARY STENT INTERVENTION N/A 11/19/2020   Procedure: CORONARY STENT INTERVENTION;  Surgeon: Yvonne Kendall, MD;  Location: [facility];  Service: Cardiovascular;  Laterality: N/A;   CORONARY ULTRASOUND/IVUS N/A 11/19/2020   Procedure: Intravascular Ultrasound/IVUS;  Surgeon:  Yvonne Kendall, MD;  Location: [facility];  Service: Cardiovascular;  Laterality: N/A;   ENDOVEIN HARVEST OF GREATER SAPHENOUS VEIN Right 12/24/2021   Procedure: ENDOVEIN HARVEST OF GREATER SAPHENOUS VEIN;  Surgeon: Corliss Skains, MD;  Location: [facility] OR;  Service: Open Heart Surgery;  Laterality: Right;   FRACTURE SURGERY     IR  THORACENTESIS ASP PLEURAL SPACE W/IMG GUIDE  01/31/2022   LEFT HEART CATH AND CORONARY ANGIOGRAPHY N/A 11/19/2020   Procedure: LEFT HEART CATH AND CORONARY ANGIOGRAPHY;  Surgeon: Yvonne Kendall, MD;  Location: [facility];  Service: Cardiovascular;  Laterality: N/A;   LEFT HEART CATH AND CORONARY ANGIOGRAPHY N/A 12/24/2020   Procedure: LEFT HEART CATH AND CORONARY ANGIOGRAPHY;  Surgeon: Swaz[place], Peter M, MD;  Location[facility] CV LAB;  Service: Cardiovascular;  Laterality: N/A;   LEFT HEART CATH AND CORONARY ANGIOGRAPHY N/A 12/16/2021   Procedure: LEFT HEART CATH AND CORONARY ANGIOGRAPHY;  Surgeon: Marykay Lex, MD;  Location: [facility] INVASIVE CV LAB;  Service: Cardiovascular;  Laterality: N/A;   LEFT HEART CATH AND CORS/GRAFTS ANGIOGRAPHY N/A 03/18/2023   Procedure: LEFT HEART CATH AND CORS/GRAFTS ANGIOGRAPHY;  Surgeon: Corky Crafts, MD;  Location: [facility] INVASIVE CV LAB;  Service: Cardiovascular;  Laterality: N/A;   LUMBAR LAMINECTOMY     SHOULDER ARTHROSCOPY WITH ROTATOR CUFF REPAIR AND SUBACROMIAL DECOMPRESSION Right 11/23/2018   Procedure: SHOULDER ARTHROSCOPY WITH ROTATOR CUFF REPAIR AND SUBACROMIAL DECOMPRESSION;  Surgeon: Teryl Lucy, MD;  Location: [facility];  Service: Orthopedics;  Laterality: Right;   SPINE SURGERY     TEE WITHOUT CARDIOVERSION N/A 12/24/2021   Procedure: TRANSESOPHAGEAL ECHOCARDIOGRAM (TEE);  Surgeon: Corliss Skains, MD;  Location: [facility] OR;  Service: Open Heart Surgery;  Laterality: N/A;    ALLERGIES: Allergies  Allergen Reactions   Niacin Anaphylaxis    Flushing - required ED visit     FAMILY HISTORY: Family History  Problem Relation Age of Onset   COPD Mother    Stroke Father    Ovarian cancer Sister    Stomach cancer Sister    Heart disease Sister        MI   Heart disease Sister        MI   Stomach cancer Maternal Grandmother    Alcohol abuse Other    Depression Other    Arthritis Other    Hypertension Other    Coronary artery disease Other    Ovarian cancer Other        neice   Ovarian cancer Other        neice   Colon polyps Neg Hx    Colon cancer Neg Hx     SOCIAL HISTORY: Social History   Tobacco Use   Smoking status: Former    Current packs/day: 0.00    Types: Cigarettes    Start date: 12/29/1969    Quit date: 12/29/1985    Years since quitting: 38.1   Smokeless tobacco: Never  Vaping Use   Vaping status: Never Used  Substance Use Topics   Alcohol use: Not Currently   Drug use: No   Social History   Social History Narrative   Exercise-- 3 days   Pt has hs degree   Right handed   One story home   Drinks caffeine 2 cups in am   Are you right handed or left handed?    Are you currently employed ?    What is your current occupation? retired   Do you live at home alone?   Who lives with you?  wife   What type of home do you live in: 1 story or 2 story?         Objective:  Vital Signs:  BP 117/64 (BP Location: Left Arm)   Pulse (!) 59   Ht 5\' 11"  (1.803 m)   Wt  204 lb (92.5 kg)   SpO2 99%   BMI 28.45 kg/m   General: General appearance: Awake and alert. No distress. Cooperative with exam.  Skin: No obvious rash or jaundice. HEENT: Atraumatic. Anicteric. Lungs: Non-labored breathing on room air  Extremities: No edema. Arthritic changes in bilateral hands.  Neurological: Mental Status: Alert. Speech fluent. No pseudobulbar affect Cranial Nerves: CNII: No RAPD. Visual Cornia intact. CNIII, IV, VI: PERRL. No nystagmus. EOMI. CN V: Facial sensation intact bilaterally to fine touch. CN VII: Facial muscles symmetric  and strong. No ptosis at rest. CN VIII: Hears finger rub well bilaterally. CN IX: No hypophonia. CN X: Palate elevates symmetrically. CN XI: Full strength shoulder shrug bilaterally. CN XII: Tongue protrusion full and midline. No atrophy or fasciculations. No significant dysarthria Motor: Tone is normal. No significant atrophy.  Individual muscle group testing (MRC grade out of 5):  Movement     Neck flexion 5    Neck extension 5     Right Left   Shoulder abduction 5- 5-   Shoulder adduction 5 5   Shoulder ext rotation 4+ 5-   Shoulder int rotation 5- 5-   Elbow flexion 5 5   Elbow extension 5 5   Finger abduction - FDI 5 5   Finger abduction - ADM 5 5   Finger extension 5 5   Finger distal flexion - 2/3 5 5    Finger distal flexion - 4/5 5 5    Thumb flexion - FPL 5- 5-   Thumb abduction - APB 4+ 4+    Hip flexion 5 5   Knee extension 5 5   Knee flexion 5 5   Dorsiflexion 5 5   Plantarflexion 5 5     Reflexes:  Right Left  Bicep 1+ 1+  Tricep 1+ 1+  BrRad 1+ 1+  Knee 1+ 1+  Ankle 0 0   Sensation: Pinprick: Diminished in bilateral lower extremities in distal to proximal fashion Vibration: Diminished to bilateral patella Coordination: Intact finger-to- nose-finger bilaterally. Romberg with mild sway. Gait: Narrow-based, mildly unsteady gait.    Lab and Test Review: New results: 12/15/23: CMP significant for glucose 128 CBC unremarkable  06/05/23 TSH wnl Lipid panel: tChol 143, LDL 82, TG 116  03/30/23: Vit D wnl B12: 482 HbA1c: 7.2  Previously reviewed results: 09/05/22: Normal or unremarkable: B12 (> 1500), B1, vit E, MMA, copper, IFE, SPEP   12/09/22: A1c: 6.5   Normal or unremarkable: TSH, lipid panel, CBC, CMP, HIV HbA1c (06/03/22): 7.2 B12 (02/26/21): 306 B1 (02/26/21): 13 Vit D (01/17/21): 42   Lumbar spine xray (06/03/22): FINDINGS: There is no evidence of lumbar spine fracture. Alignment is normal. There is moderate disc space narrowing at  L4-L5 and L5-S1 with endplate osteophyte formation, similar to the prior study. Degenerative osteophytes are seen at the endplates at L1-L2, also unchanged. There are atherosclerotic calcifications of the aorta.   IMPRESSION: 1. No evidence for fracture or malalignment. 2. Stable moderate degenerative changes.   MRI cervical spine (11/26/21): FINDINGS: Alignment: No significant listhesis.   Vertebrae: No fracture, evidence of discitis, or bone lesion. Discogenic endplate marrow changes most pronounced at C6-7.   Cord: Normal signal and morphology.   Posterior Fossa, vertebral arteries, paraspinal tissues: Negative.   Disc levels:   C2-C3: Unremarkable.   C3-C4: Small disc osteophyte complex, slightly eccentric to the left. Unremarkable facet joints. Moderate left foraminal stenosis. No canal stenosis. Unchanged.   C4-C5: Minimal disc osteophyte complex. Mild facet hypertrophy. Mild  bilateral foraminal stenosis. No canal stenosis. Unchanged.   C5-C6: Disc osteophyte complex with left uncovertebral spurring. Minimal facet hypertrophy on the left. Severe left and mild-moderate right foraminal stenosis. No significant canal stenosis. Unchanged.   C6-C7: Disc osteophyte complex with bilateral uncovertebral spurring resulting in moderate to severe left and moderate right foraminal stenosis. No canal stenosis. Unchanged.   C7-T1: Unremarkable.   IMPRESSION: 1. No significant interval progression of multilevel cervical spondylosis, as described above. No significant canal stenosis at any level. 2. Severe left foraminal stenosis at C5-6 and moderate-to-severe left foraminal stenosis at C6-7.   MRI brain wo contrast (03/18/21): FINDINGS: Brain: Mild generalized atrophy with progression. Mild ventricular enlargement due to atrophy. Mild hyperintensity in the pons bilaterally likely due to chronic microvascular ischemia. Cerebral white matter normal. No hemorrhage or mass  identified.   Vascular: Normal arterial flow voids.   Skull and upper cervical spine: No focal skeletal lesion identified.   Sinuses/Orbits: Mild mucosal edema paranasal sinuses. Negative orbit   Other: None   IMPRESSION: No acute abnormality   Mild atrophy, progressive since 2016.   MRI lumbar spine (06/11/19): FINDINGS: Segmentation: The lowest lumbar type non-rib-bearing vertebra is labeled as L5.   Alignment:  No vertebral subluxation is observed.   Vertebrae: Type 2 degenerative endplate findings at L4-5 and to a lesser extent at L1-2 and L5-S1. Schmorl's node along the anterior inferior endplate of L1. Disc desiccation at L4-5 and L5-S1 with loss of disc height especially at L5-S1.   Conus medullaris and cauda equina: Conus extends to the L1 level. Conus and cauda equina appear normal.   Paraspinal and other soft tissues: A fluid signal intensity lesion of the left kidney is partially characterized on today's exam. This is statistically likely to be a cyst but not technically specific.   Disc levels:   L1-2: No impingement.  Diffuse disc bulge.   L2-3: Mild displacement of the right L2 nerve in the lateral extraforaminal space due to right lateral extraforaminal disc protrusion. Diffuse disc bulge.   L3-4: No impingement.  Diffuse disc bulge.   L4-5: Mild bilateral subarticular lateral recess stenosis due to disc bulge and mild left facet arthropathy.   L5-S1: Borderline bilateral foraminal stenosis due to intervertebral and facet spurring as well as disc bulge. Prior right laminectomy.   IMPRESSION: 1. Lumbar spondylosis and degenerative disc disease, causing mild impingement at L2-3 and L4-5 as detailed above.  EMG (10/27/22): NCV & EMG Findings: Extensive electrodiagnostic evaluation of the right upper and lower limbs shows: Absent right sural, superficial fibular, and median sensory responses. Right ulnar and radial sensory responses are within normal  limits. Right tibial (AH) motor response shows reduced amplitude (2.6 mV). Right median (APB) motor response shows prolonged distal onset latency (7.1 ms). Right fibular (EDB) and ulnar (ADM) motor responses are within normal limits. Right H reflex is absent. Chronic motor axon loss changes without accompanying active denervation changes are seen in the right tibialis anterior, medial head of gastrocnemius, first dorsal interosseous, and extensor indicis proprius muscles.   Impression: This is an abnormal electrodiagnostic evaluation. The findings are most consistent with the following: Evidence of a generalized sensorimotor polyneuropathy, axon loss in type, mild in degree electrically. Evidence of a right median mononeuropathy at or distal to the wrist, consistent with carpal tunnel syndrome, severe in degree electrically. The residuals of an old intraspinal canal lesion (ie: motor radiculopathy) at the right C8 root, mild in degree electrically. No definite evidence of a right  lumbosacral (L3-S1) motor radiculopathy.    ASSESSMENT: This is WELLES WALTHALL, a 68 y.o. 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 Stable. Bilateral carpal tunnel syndrome - severe electrically on the right. Patient has not worn wrist braces except a few times. Symptoms stable to mildly worse. Cervical radiculopathy - residuals of an old right C8 radiculopathy Borderline low B12 (2023)  Plan: -Recommend bilateral wrist splints for carpal tunnel syndrome -Increase gabapentin to 900 mg TID -Continue B12 supplementation 1000 mcg daily -Fall precautions discussed  Return to clinic in 1 year  Total time spent reviewing records, interview, history/exam, documentation, and coordination of care on day of encounter:  35 min  Jacquelyne Balint, MD

## 2024-01-19 NOTE — Progress Notes (Unsigned)
Established Patient Office Visit  Subjective   Patient ID: Ronald Petersen, male    DOB: 1956/02/17  Age: 68 y.o. MRN: [id number]  Chief Complaint  Patient presents with   Chest Pain   Blood Sugar Problem    HPI Pt is here c/o elevated glucose.  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) 01/22/2024   Mixed restrictive and obstructive lung disease (HCC) 01/22/2024   Hyperglycemia 01/22/2024   Lung nodule 01/22/2024   Abdominal pain 03/18/2023   Capsulitis of left shoulder 03/12/2023   Lumbar radiculopathy 08/19/2022   Suspicious nevus 08/15/2022   Bulge of lumbar disc without myelopathy 08/15/2022   Cervical radiculopathy 08/04/2022   Nocturia 06/03/2022   Atypical chest pain 01/25/2022   Pleural effusion 01/24/2022   S/P CABG x 2 12/24/2021   Coronary artery disease involving native coronary artery of native heart with unstable angina pectoris (HCC) 12/16/2021   Chronic pain syndrome 06/07/2021   Mild neurocognitive disorder due to multiple etiologies 05/10/2021   Type II diabetes mellitus with hypoglycemia (HCC) 01/22/2021   Type 2 diabetes mellitus with diabetic polyneuropathy, without long-term current use of insulin (HCC) 01/22/2021   Metabolic syndrome 01/17/2021   Coronary artery disease 01/17/2021   Anxiety    Arthritis    Cancer (HCC)    Complication of anesthesia    GERD (gastroesophageal reflux disease)    Hyperlipidemia    Hypertension    Neuromuscular disorder (HCC)    Sleep apnea    Coronary artery disease involving native coronary artery of native heart with angina pectoris (HCC) 11/20/2020   CAD S/P DES PCI LAD & LCx-OM 11/20/2020   Unstable angina (HCC) 11/19/2020   Abnormal cardiac CT angiography 11/19/2020   Shortness of breath 10/02/2020   Dizziness 10/02/2020   Chest pain 10/02/2020   Obesity (BMI  30-39.9) 10/02/2020   Type 2 diabetes mellitus with diabetic neuropathy, without long-term current use of insulin (HCC) 06/06/2020   Uncontrolled type 2 diabetes mellitus with hyperglycemia (HCC) 05/31/2020   Blurry vision 02/07/2020   Spinal stenosis of lumbar region 02/07/2020   Frequent falls 02/07/2020   Foraminal stenosis of cervical region 07/26/2019   Aortic atherosclerosis (HCC) 05/27/2019   Chronic bilateral low back pain without sciatica 05/20/2019   Carpal tunnel syndrome, bilateral 05/20/2019   Weakness of lower extremity 01/05/2019   Falling episodes 01/05/2019   Right rotator cuff tear 11/23/2018   Neck pain 11/04/2018   Acute pain of right shoulder 11/04/2018   Gastroesophageal reflux disease 11/04/2018   Hyperlipidemia associated with type 2 diabetes mellitus (HCC) 11/04/2018   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 06/28/2018   Essential hypertension 09/14/2017   Preventative health care 06/12/2016   OSA (obstructive sleep apnea) 04/07/2016   Joint pain 04/05/2016   NSAID long-term use 12/13/2015   Nausea 12/13/2015   History of colonic polyps 12/13/2015   Weight loss 12/13/2015   Depression 11/06/2015   Metatarsal deformity 02/20/2015   Equinus deformity of foot, acquired 02/20/2015   Plantar fasciitis, bilateral 02/15/2015   Left wrist injury 06/15/2014   Peripheral neuropathy 04/01/2012   Fatigue 11/05/2010   OTHER SPECIFIED DISORDER OF PENIS 03/26/2010   NAUSEA 03/26/2010   Pain in limb 10/17/2008   NECK PAIN, CHRONIC 11/24/2007   Diabetes  mellitus, type II (HCC) 01/27/2007   Hyperlipidemia LDL goal <70 01/27/2007   Past Medical History:  Diagnosis Date   Allergy    Anxiety    Arthritis    CAD (coronary artery disease)    Cancer (HCC)    CHF (congestive heart failure) (HCC)    Complication of anesthesia    aspirated with back surgery at age 64   Depression    Diabetes mellitus Type II    GERD (gastroesophageal reflux  disease)    Hyperlipidemia    Hypertension    Mild aortic stenosis    Mild carotid artery disease (HCC)    Neuromuscular disorder (HCC)    NEUROPATHY   Plavix resistance    abnormal cytochrome P450 2c19 genotype in 2021 inferring suboptimal Plavix platelet inhibition,   Right rotator cuff tear 11/23/2018   Sleep apnea    no CPAP   Past Surgical History:  Procedure Laterality Date   APPENDECTOMY     ARTHOSCOPIC ROTAOR CUFF REPAIR Right 11/23/2018   Procedure: ARTHROSCOPIC ROTATOR CUFF REPAIR;  Surgeon: Teryl Lucy, MD;  Location: [facility];  Service: Orthopedics;  Laterality: Right;   CARDIAC VALVE REPLACEMENT     CHOLECYSTECTOMY     CORONARY ARTERY BYPASS GRAFT N/A 12/24/2021   x2 LIMA to LAD; SVG to Obtuse Marginal   CORONARY STENT INTERVENTION N/A 11/19/2020   Procedure: CORONARY STENT INTERVENTION;  Surgeon: Naveen Lorusso Kendall, MD;  Location: [facility];  Service: Cardiovascular;  Laterality: N/A;   CORONARY ULTRASOUND/IVUS N/A 11/19/2020   Procedure: Intravascular Ultrasound/IVUS;  Surgeon: Ambry Dix Kendall, MD;  Location: [facility];  Service: Cardiovascular;  Laterality: N/A;   ENDOVEIN HARVEST OF GREATER SAPHENOUS VEIN Right 12/24/2021   Procedure: ENDOVEIN HARVEST OF GREATER SAPHENOUS VEIN;  Surgeon: Corliss Skains, MD;  Location: [facility] OR;  Service: Open Heart Surgery;  Laterality: Right;   FRACTURE SURGERY     IR THORACENTESIS ASP PLEURAL SPACE W/IMG GUIDE  01/31/2022   LEFT HEART CATH AND CORONARY ANGIOGRAPHY N/A 11/19/2020   Procedure: LEFT HEART CATH AND CORONARY ANGIOGRAPHY;  Surgeon: Aniston Christman Kendall, MD;  Location: [facility];  Service: Cardiovascular;  Laterality: N/A;   LEFT HEART CATH AND CORONARY ANGIOGRAPHY N/A 12/24/2020   Procedure: LEFT HEART CATH AND CORONARY ANGIOGRAPHY;  Surgeon: Swaziland, Peter M, MD;  Location: [facility] INV[facility] CV LAB;  Service: Cardiovascular;  Laterality: N/A;   LEFT HEART CATH AND CORONARY  ANGIOGRAPHY N/A 12/16/2021   Procedure: LEFT HEART CATH AND CORONARY ANGIOGRAPHY;  Surgeon: Marykay Lex, MD;  Location: [facility];  Service: Cardiovascular;  Laterality: N/A;   LEFT HEART CATH AND CORS/GRAFTS ANGIOGRAPHY N/A 03/18/2023   Procedure: LEFT HEART CATH AND CORS/GRAFTS ANGIOGRAPHY;  Surgeon: Corky Crafts, MD;  Location: [facility] INVASIVE CV LAB;  Service: Cardiovascular;  Laterality: N/A;   LUMBAR LAMINECTOMY     SHOULDER ARTHROSCOPY WITH ROTATOR CUFF REPAIR AND SUBACROMIAL DECOMPRESSION Right 11/23/2018   Procedure: SHOULDER ARTHROSCOPY WITH ROTATOR CUFF REPAIR AND SUBACROMIAL DECOMPRESSION;  Surgeon: Teryl Lucy, MD;  Location: [facility];  Service: Orthopedics;  Laterality: Right;   SPINE SURGERY     TEE WITHOUT CARDIOVERSION N/A 12/24/2021   Procedure: TRANSESOPHAGEAL ECHOCARDIOGRAM (TEE);  Surgeon: Corliss Skains, MD;  Location: [facility];  Service: Open Heart Surgery;  Laterality: N/A;   Social History   Tobacco Use   Smoking status: Former    Current packs/day: 0.00    Types: Cigarettes  Start date: 12/29/1969    Quit date: 12/29/1985    Years since quitting: 38.0   Smokeless tobacco: Never  Vaping Use   Vaping status: Never Used  Substance Use Topics   Alcohol use: Not Currently   Drug use: No   Social History   Socioeconomic History   Marital status: Married    Spouse name: Erie Noe   Number of children: 2   Years of education: Not on file   Highest education level: GED or equivalent  Occupational History   Occupation: self employed    Associate Professor: UNEMPLOYED  Tobacco Use   Smoking status: Former    Current packs/day: 0.00    Types: Cigarettes    Start date: 12/29/1969    Quit date: 12/29/1985    Years since quitting: 38.0   Smokeless tobacco: Never  Vaping Use   Vaping status: Never Used  Substance and Sexual Activity   Alcohol use: Not Currently   Drug use: No   Sexual activity: Yes    Partners: Female  Other Topics  Concern   Not on file  Social History Narrative   Exercise-- 3 days   Pt has hs degree   Right handed   One story home   Drinks caffeine 2 cups in am   Are you right handed or left handed?    Are you currently employed ?    What is your current occupation? retired   Do you live at home alone?   Who lives with you?  wife   What type of home do you live in: 1 story or 2 story?        Social Drivers of Health   Financial Resource Strain: High Risk (12/15/2023)   Overall Financial Resource Strain (CARDIA)    Difficulty of Paying Living Expenses: Hard  Food Insecurity: Food Insecurity Present (12/15/2023)   Hunger Vital Sign    Worried About Running Out of Food in the Last Year: Sometimes true    Ran Out of Food in the Last Year: Sometimes true  Transportation Needs: No Transportation Needs (12/15/2023)   PRAPARE - Administrator, Civil Service (Medical): No    Lack of Transportation (Non-Medical): No  Physical Activity: Insufficiently Active (12/15/2023)   Exercise Vital Sign    Days of Exercise per Week: 2 days    Minutes of Exercise per Session: 20 min  Stress: No Stress Concern Present (12/15/2023)   [employer] of Occupational Health - Occupational Stress Questionnaire    Feeling of Stress : Only a little  Social Connections: Unknown (12/15/2023)   Social Connection and Isolation Panel [NHANES]    Frequency of Communication with Friends and Family: Three times a week    Frequency of Social Gatherings with Friends and Family: Three times a week    Attends Religious Services: More than 4 times per year    Active Member of Clubs or Organizations: Patient declined    Attends Banker Meetings: Not on file    Marital Status: Married  Intimate Partner Violence: Not At Risk (03/17/2023)   Humiliation, Afraid, Rape, and Kick questionnaire    Fear of Current or Ex-Partner: No    Emotionally Abused: No    Physically Abused: No    Sexually Abused: No    Family Status  Relation Name Status   Mother Tavish Gettis Deceased   Father Mickle Rabadi Deceased   Sister debbie Deceased at age 2   Sister vicki Alive   Sister  lois Deceased   MGM  (Not Specified)   Other  (Not Specified)   Other  (Not Specified)   Other  (Not Specified)   Other  (Not Specified)   Other  (Not Specified)   Other Dawn (Not Specified)   Other Annabelle Harman (Not Specified)   Other neg hx (Not Specified)   Neg Hx  (Not Specified)  No partnership data on file   Family History  Problem Relation Age of Onset   COPD Mother    Stroke Father    Ovarian cancer Sister    Stomach cancer Sister    Heart disease Sister        MI   Heart disease Sister        MI   Stomach cancer Maternal Grandmother    Alcohol abuse Other    Depression Other    Arthritis Other    Hypertension Other    Coronary artery disease Other    Ovarian cancer Other        neice   Ovarian cancer Other        neice   Colon polyps Neg Hx    Colon cancer Neg Hx    Allergies  Allergen Reactions   Niacin Anaphylaxis    Flushing - required ED visit    ROS    Objective:     BP (!) 145/74   Pulse 63   Temp 98.2 F (36.8 C)   Resp 18   Wt 200 lb 6.4 oz (90.9 kg)   SpO2 99%   BMI 27.95 kg/m  BP Readings from Last 3 Encounters:  01/19/24 (!) 145/74  12/15/23 134/76  07/28/23 118/70   Wt Readings from Last 3 Encounters:  01/19/24 200 lb 6.4 oz (90.9 kg)  12/15/23 205 lb 6.4 oz (93.2 kg)  07/28/23 201 lb 9.6 oz (91.4 kg)   SpO2 Readings from Last 3 Encounters:  01/19/24 99%  12/15/23 99%  07/28/23 98%      Physical Exam   Results for orders placed or performed in visit on 01/19/24  CBC with Differential/Platelet  Result Value Ref Range   WBC 7.2 3.8 - 10.8 Thousand/uL   RBC 6.16 (H) 4.20 - 5.80 Million/uL   Hemoglobin 17.4 (H) 13.2 - 17.1 g/dL   HCT 25.3 (H) 66.4 - 40.3 %   MCV 84.3 80.0 - 100.0 fL   MCH 28.2 27.0 - 33.0 pg   MCHC 33.5 32.0 - 36.0 g/dL   RDW 47.4  25.9 - 56.3 %   Platelets 181 140 - 400 Thousand/uL   MPV 10.0 7.5 - 12.5 fL   Neutro Abs 5,033 1,500 - 7,800 cells/uL   Absolute Lymphocytes 1,562 850 - 3,900 cells/uL   Absolute Monocytes 468 200 - 950 cells/uL   Eosinophils Absolute 108 15 - 500 cells/uL   Basophils Absolute 29 0 - 200 cells/uL   Neutrophils Relative % 69.9 %   Total Lymphocyte 21.7 %   Monocytes Relative 6.5 %   Eosinophils Relative 1.5 %   Basophils Relative 0.4 %  Comprehensive metabolic panel  Result Value Ref Range   Glucose, Bld 128 (H) 65 - 99 mg/dL   BUN 36 (H) 7 - 25 mg/dL   Creat 8.75 6.43 - 3.29 mg/dL   BUN/Creatinine Ratio 40 (H) 6 - 22 (calc)   Sodium 137 135 - 146 mmol/L   Potassium 4.3 3.5 - 5.3 mmol/L   Chloride 99 98 - 110 mmol/L   CO2 28 20 - 32  mmol/L   Calcium 10.0 8.6 - 10.3 mg/dL   Total Protein 7.6 6.1 - 8.1 g/dL   Albumin 4.8 3.6 - 5.1 g/dL   Globulin 2.8 1.9 - 3.7 g/dL (calc)   AG Ratio 1.7 1.0 - 2.5 (calc)   Total Bilirubin 0.8 0.2 - 1.2 mg/dL   Alkaline phosphatase (APISO) 101 35 - 144 U/L   AST 17 10 - 35 U/L   ALT 17 9 - 46 U/L  Hemoglobin A1c  Result Value Ref Range   Hgb A1c MFr Bld 7.4 (H) <5.7 % of total Hgb   Mean Plasma Glucose 166 mg/dL   eAG (mmol/L) 9.2 mmol/L  Lipid panel  Result Value Ref Range   Cholesterol 222 (H) <200 mg/dL   HDL 43 > OR = 40 mg/dL   Triglycerides 119 (H) <150 mg/dL   LDL Cholesterol (Calc) 148 (H) mg/dL (calc)   Total CHOL/HDL Ratio 5.2 (H) <5.0 (calc)   Non-HDL Cholesterol (Calc) 179 (H) <130 mg/dL (calc)  POCT Glucose (CBG)  Result Value Ref Range   POC Glucose 128 (A) 70 - 99 mg/dl  POCT Urinalysis Dipstick (Automated)  Result Value Ref Range   Color, UA yellow    Clarity, UA clear    Glucose, UA Positive (A) Negative   Bilirubin, UA negative    Ketones, UA trace    Spec Grav, UA 1.015 1.010 - 1.025   Blood, UA trace    pH, UA 5.0 5.0 - 8.0   Protein, UA Negative Negative   Urobilinogen, UA 0.2 0.2 or 1.0 E.U./dL   Nitrite,  UA negative    Leukocytes, UA Negative Negative    Last CBC Lab Results  Component Value Date   WBC 7.2 01/19/2024   HGB 17.4 (H) 01/19/2024   HCT 51.9 (H) 01/19/2024   MCV 84.3 01/19/2024   MCH 28.2 01/19/2024   RDW 12.4 01/19/2024   PLT 181 01/19/2024   Last metabolic panel Lab Results  Component Value Date   GLUCOSE 128 (H) 01/19/2024   NA 137 01/19/2024   K 4.3 01/19/2024   CL 99 01/19/2024   CO2 28 01/19/2024   BUN 36 (H) 01/19/2024   CREATININE 0.91 01/19/2024   GFR 85.50 12/15/2023   CALCIUM 10.0 01/19/2024   PHOS 4.0 01/25/2022   PROT 7.6 01/19/2024   ALBUMIN 4.5 12/15/2023   LABGLOB 2.5 02/18/2022   AGRATIO 1.9 02/18/2022   BILITOT 0.8 01/19/2024   ALKPHOS 94 12/15/2023   AST 17 01/19/2024   ALT 17 01/19/2024   ANIONGAP 8 03/17/2023   Last lipids Lab Results  Component Value Date   CHOL 222 (H) 01/19/2024   HDL 43 01/19/2024   LDLCALC 148 (H) 01/19/2024   LDLDIRECT 93.0 05/31/2020   TRIG 179 (H) 01/19/2024   CHOLHDL 5.2 (H) 01/19/2024   Last hemoglobin A1c Lab Results  Component Value Date   HGBA1C 7.4 (H) 01/19/2024   Last thyroid functions Lab Results  Component Value Date   TSH 1.22 06/05/2023   Last vitamin D Lab Results  Component Value Date   VD25OH 37.26 03/30/2023   Last vitamin B12 and Folate Lab Results  Component Value Date   VITAMINB12 482 03/30/2023   FOLATE 10.9 11/05/2010      The 10-year ASCVD risk score (Arnett DK, et al., 2019) is: 37.9%    Assessment & Plan:   Problem List Items Addressed This Visit       Unprioritized   Neuromuscular disorder (HCC)  Cancer (HCC)   Type 2 diabetes mellitus with diabetic neuropathy, without long-term current use of insulin (HCC)   Relevant Medications   insulin degludec (TRESIBA FLEXTOUCH) 100 UNIT/ML FlexTouch Pen   Diabetes mellitus, type II (HCC) - Primary   Relevant Medications   insulin degludec (TRESIBA FLEXTOUCH) 100 UNIT/ML FlexTouch Pen   Other Relevant Orders    POCT Glucose (CBG) (Completed)   Hemoglobin A1c (Completed)   Lipid panel (Completed)   Mixed restrictive and obstructive lung disease (HCC)   Lung nodule   Relevant Orders   CT Chest Wo Contrast   Hyperglycemia   Relevant Orders   POCT Urinalysis Dipstick (Automated) (Completed)   CBC with Differential/Platelet (Completed)   Comprehensive metabolic panel (Completed)   Hemoglobin A1c (Completed)   Lipid panel (Completed)   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    Donato Schultz, DO

## 2024-01-20 ENCOUNTER — Other Ambulatory Visit (HOSPITAL_BASED_OUTPATIENT_CLINIC_OR_DEPARTMENT_OTHER): Payer: Self-pay

## 2024-01-20 LAB — CBC WITH DIFFERENTIAL/PLATELET
Absolute Lymphocytes: 1562 {cells}/uL (ref 850–3900)
Absolute Monocytes: 468 {cells}/uL (ref 200–950)
Basophils Absolute: 29 {cells}/uL (ref 0–200)
Basophils Relative: 0.4 %
Eosinophils Absolute: 108 {cells}/uL (ref 15–500)
Eosinophils Relative: 1.5 %
HCT: 51.9 % — ABNORMAL HIGH (ref 38.5–50.0)
Hemoglobin: 17.4 g/dL — ABNORMAL HIGH (ref 13.2–17.1)
MCH: 28.2 pg (ref 27.0–33.0)
MCHC: 33.5 g/dL (ref 32.0–36.0)
MCV: 84.3 fL (ref 80.0–100.0)
MPV: 10 fL (ref 7.5–12.5)
Monocytes Relative: 6.5 %
Neutro Abs: 5033 {cells}/uL (ref 1500–7800)
Neutrophils Relative %: 69.9 %
Platelets: 181 10*3/uL (ref 140–400)
RBC: 6.16 10*6/uL — ABNORMAL HIGH (ref 4.20–5.80)
RDW: 12.4 % (ref 11.0–15.0)
Total Lymphocyte: 21.7 %
WBC: 7.2 10*3/uL (ref 3.8–10.8)

## 2024-01-20 LAB — COMPREHENSIVE METABOLIC PANEL
AG Ratio: 1.7 (calc) (ref 1.0–2.5)
ALT: 17 U/L (ref 9–46)
AST: 17 U/L (ref 10–35)
Albumin: 4.8 g/dL (ref 3.6–5.1)
Alkaline phosphatase (APISO): 101 U/L (ref 35–144)
BUN/Creatinine Ratio: 40 (calc) — ABNORMAL HIGH (ref 6–22)
BUN: 36 mg/dL — ABNORMAL HIGH (ref 7–25)
CO2: 28 mmol/L (ref 20–32)
Calcium: 10 mg/dL (ref 8.6–10.3)
Chloride: 99 mmol/L (ref 98–110)
Creat: 0.91 mg/dL (ref 0.70–1.35)
Globulin: 2.8 g/dL (ref 1.9–3.7)
Glucose, Bld: 128 mg/dL — ABNORMAL HIGH (ref 65–99)
Potassium: 4.3 mmol/L (ref 3.5–5.3)
Sodium: 137 mmol/L (ref 135–146)
Total Bilirubin: 0.8 mg/dL (ref 0.2–1.2)
Total Protein: 7.6 g/dL (ref 6.1–8.1)

## 2024-01-20 LAB — HEMOGLOBIN A1C
Hgb A1c MFr Bld: 7.4 %{Hb} — ABNORMAL HIGH (ref <5.7)
Mean Plasma Glucose: 166 mg/dL
eAG (mmol/L): 9.2 mmol/L

## 2024-01-20 LAB — LIPID PANEL
Cholesterol: 222 mg/dL — ABNORMAL HIGH (ref <200)
HDL: 43 mg/dL (ref >=40)
LDL Cholesterol (Calc): 148 mg/dL — ABNORMAL HIGH
Non-HDL Cholesterol (Calc): 179 mg/dL — ABNORMAL HIGH (ref <130)
Total CHOL/HDL Ratio: 5.2 (calc) — ABNORMAL HIGH (ref <5.0)
Triglycerides: 179 mg/dL — ABNORMAL HIGH (ref <150)

## 2024-01-22 ENCOUNTER — Telehealth: Payer: Self-pay | Admitting: Family Medicine

## 2024-01-22 ENCOUNTER — Ambulatory Visit: Payer: Medicare HMO | Admitting: Neurology

## 2024-01-22 ENCOUNTER — Other Ambulatory Visit (HOSPITAL_BASED_OUTPATIENT_CLINIC_OR_DEPARTMENT_OTHER): Payer: Self-pay

## 2024-01-22 ENCOUNTER — Other Ambulatory Visit (HOSPITAL_COMMUNITY): Payer: Self-pay

## 2024-01-22 DIAGNOSIS — J439 Emphysema, unspecified: Secondary | ICD-10-CM | POA: Insufficient documentation

## 2024-01-22 DIAGNOSIS — R911 Solitary pulmonary nodule: Secondary | ICD-10-CM | POA: Insufficient documentation

## 2024-01-22 DIAGNOSIS — Z9911 Dependence on respirator [ventilator] status: Secondary | ICD-10-CM | POA: Insufficient documentation

## 2024-01-22 DIAGNOSIS — R739 Hyperglycemia, unspecified: Secondary | ICD-10-CM | POA: Insufficient documentation

## 2024-01-22 MED ORDER — TRUEPLUS 5-BEVEL PEN NEEDLES 31G X 5 MM MISC
0 refills | Status: DC
  Filled 2024-01-22: qty 100, 100d supply, fill #0

## 2024-01-22 NOTE — Assessment & Plan Note (Signed)
Add tresiba  F/u

## 2024-01-22 NOTE — Assessment & Plan Note (Signed)
EKG-- no change from previous If pain returns-- go to ER

## 2024-01-22 NOTE — Telephone Encounter (Signed)
Copied from CRM [id number]. Topic: Clinical - Medication Question >> Jan 22, 2024  3:18 PM Alona Bene A wrote: Reason for CRM: Patient reached out for assistance with medication. Patient would like to know if he should continue taking the empagliflozin (JARDIANCE) 25 MG TABS tablet and the insulin degludec (TRESIBA FLEXTOUCH) 100 UNIT/ML FlexTouch Pen or one or the other. Patient would also like to know his blood test results. Please contact patient to discuss. Patient can be reached at [phone].

## 2024-01-22 NOTE — Patient Instructions (Signed)

## 2024-01-23 ENCOUNTER — Encounter: Payer: Self-pay | Admitting: Family Medicine

## 2024-01-23 NOTE — Assessment & Plan Note (Signed)
Well controlled, no changes to meds. Encouraged heart healthy diet such as the DASH diet and exercise as tolerated.

## 2024-01-23 NOTE — Assessment & Plan Note (Signed)
Hgba1 to be checked.  minimize simple carbs. Increase exercise as tolerated. Continue current meds  F/u shamleffer

## 2024-01-23 NOTE — Assessment & Plan Note (Signed)
Encourage heart healthy diet such as MIND or DASH diet, increase exercise, avoid trans fats, simple carbohydrates and processed foods, consider a krill or fish or flaxseed oil cap daily.

## 2024-01-25 NOTE — Telephone Encounter (Signed)
Pt made aware

## 2024-01-28 ENCOUNTER — Other Ambulatory Visit (HOSPITAL_BASED_OUTPATIENT_CLINIC_OR_DEPARTMENT_OTHER): Payer: Self-pay

## 2024-01-28 ENCOUNTER — Ambulatory Visit: Payer: HMO | Admitting: Neurology

## 2024-01-28 ENCOUNTER — Encounter: Payer: Self-pay | Admitting: Neurology

## 2024-01-28 VITALS — BP 117/64 | HR 59 | Ht 71.0 in | Wt 204.0 lb

## 2024-01-28 DIAGNOSIS — M48061 Spinal stenosis, lumbar region without neurogenic claudication: Secondary | ICD-10-CM | POA: Diagnosis not present

## 2024-01-28 DIAGNOSIS — M47812 Spondylosis without myelopathy or radiculopathy, cervical region: Secondary | ICD-10-CM

## 2024-01-28 DIAGNOSIS — E560 Deficiency of vitamin E: Secondary | ICD-10-CM

## 2024-01-28 DIAGNOSIS — E519 Thiamine deficiency, unspecified: Secondary | ICD-10-CM

## 2024-01-28 DIAGNOSIS — R209 Unspecified disturbances of skin sensation: Secondary | ICD-10-CM

## 2024-01-28 DIAGNOSIS — G621 Alcoholic polyneuropathy: Secondary | ICD-10-CM

## 2024-01-28 DIAGNOSIS — E1142 Type 2 diabetes mellitus with diabetic polyneuropathy: Secondary | ICD-10-CM | POA: Diagnosis not present

## 2024-01-28 DIAGNOSIS — R269 Unspecified abnormalities of gait and mobility: Secondary | ICD-10-CM

## 2024-01-28 DIAGNOSIS — E538 Deficiency of other specified B group vitamins: Secondary | ICD-10-CM | POA: Diagnosis not present

## 2024-01-28 MED ORDER — GABAPENTIN 300 MG PO CAPS
900.0000 mg | ORAL_CAPSULE | Freq: Three times a day (TID) | ORAL | 11 refills | Status: DC
  Filled 2024-01-28: qty 270, 30d supply, fill #0
  Filled 2024-02-24: qty 270, 30d supply, fill #1
  Filled 2024-03-25: qty 270, 30d supply, fill #2
  Filled 2024-04-25: qty 270, 30d supply, fill #3
  Filled 2024-05-23: qty 270, 30d supply, fill #4
  Filled 2024-06-20: qty 270, 30d supply, fill #5
  Filled 2024-07-20: qty 270, 30d supply, fill #6
  Filled 2024-08-16: qty 270, 30d supply, fill #7
  Filled 2024-09-14: qty 270, 30d supply, fill #8
  Filled 2024-10-13: qty 270, 30d supply, fill #9
  Filled 2024-11-10: qty 270, 30d supply, fill #10
  Filled 2024-12-07: qty 270, 30d supply, fill #11

## 2024-01-28 NOTE — Patient Instructions (Addendum)
I recommend you wear wrist braces every night to prevent your carpal tunnel from getting worse and making your hands weak. Cock up wrist splint for carpal tunnel symptoms can be bought in local drug stores or online. This should especially be worn at night while sleeping.    I am increasing your gabapentin to 900 mg three times a day (3 tablets, 3 times a day). I sent a new prescription to your pharmacy.  Continue the B12 and vitamin E supplements.  I will see you back in clinic in 1 year or sooner if needed. Please let me know if you have any questions or concerns in the meantime.   The physicians and staff at [facility] are committed to providing excellent care. You may receive a survey requesting feedback about your experience at our office. We strive to receive "very good" responses to the survey questions. If you feel that your experience would prevent you from giving the office a "very good " response, please contact our office to try to remedy the situation. We may be reached at [phone]. Thank you for taking the time out of your busy day to complete the survey.  Jacquelyne Balint, MD [facility]  Preventing Falls at [facility] are common, often dreaded events in the lives of older people. Aside from the obvious injuries and even death that may result, fall can cause wide-ranging consequences including loss of independence, mental decline, decreased activity and mobility. Younger people are also at risk of falling, especially those with chronic illnesses and fatigue.  Ways to reduce risk for falling Examine diet and medications. Warm foods and alcohol dilate blood vessels, which can lead to dizziness when standing. Sleep aids, antidepressants and pain medications can also increase the likelihood of a fall.  Get a vision exam. Poor vision, cataracts and glaucoma increase the chances of falling.  Check foot gear. Shoes should fit snugly and have a sturdy, nonskid sole and a  broad, low heel  Participate in a physician-approved exercise program to build and maintain muscle strength and improve balance and coordination. Programs that use ankle weights or stretch bands are excellent for muscle-strengthening. Water aerobics programs and low-impact Tai Chi programs have also been shown to improve balance and coordination.  Increase vitamin D intake. Vitamin D improves muscle strength and increases the amount of calcium the body is able to absorb and deposit in bones.  How to prevent falls from common hazards Floors - Remove all loose wires, cords, and throw rugs. Minimize clutter. Make sure rugs are anchored and smooth. Keep furniture in its usual place.  Chairs -- Use chairs with straight backs, armrests and firm seats. Add firm cushions to existing pieces to add height.  Bathroom - Install grab bars and non-skid tape in the tub or shower. Use a bathtub transfer bench or a shower chair with a back support Use an elevated toilet seat and/or safety rails to assist standing from a low surface. Do not use towel racks or bathroom tissue holders to help you stand.  Lighting - Make sure halls, stairways, and entrances are well-lit. Install a night light in your bathroom or hallway. Make sure there is a light switch at the top and bottom of the staircase. Turn lights on if you get up in the middle of the night. Make sure lamps or light switches are within reach of the bed if you have to get up during the night.  Kitchen - Install non-skid rubber mats near the  sink and stove. Clean spills immediately. Store frequently used utensils, pots, pans between waist and eye level. This helps prevent reaching and bending. Sit when getting things out of lower cupboards.  Living room/ Bedrooms - Place furniture with wide spaces in between, giving enough room to move around. Establish a route through the living room that gives you something to hold onto as you walk.  Stairs - Make sure treads,  rails, and rugs are secure. Install a rail on both sides of the stairs. If stairs are a threat, it might be helpful to arrange most of your activities on the lower level to reduce the number of times you must climb the stairs.  Entrances and doorways - Install metal handles on the walls adjacent to the doorknobs of all doors to make it more secure as you travel through the doorway.  Tips for maintaining balance Keep at least one hand free at all times. Try using a backpack or fanny pack to hold things rather than carrying them in your hands. Never carry objects in both hands when walking as this interferes with keeping your balance.  Attempt to swing both arms from front to back while walking. This might require a conscious effort if Parkinson's disease has diminished your movement. It will, however, help you to maintain balance and posture, and reduce fatigue.  Consciously lift your feet off of the ground when walking. Shuffling and dragging of the feet is a common culprit in losing your balance.  When trying to navigate turns, use a "U" technique of facing forward and making a wide turn, rather than pivoting sharply.  Try to stand with your feet shoulder-length apart. When your feet are close together for any length of time, you increase your risk of losing your balance and falling.  Do one thing at a time. Don't try to walk and accomplish another task, such as reading or looking around. The decrease in your automatic reflexes complicates motor function, so the less distraction, the better.  Do not wear rubber or gripping soled shoes, they might "catch" on the floor and cause tripping.  Move slowly when changing positions. Use deliberate, concentrated movements and, if needed, use a grab bar or walking aid. Count 15 seconds between each movement. For example, when rising from a seated position, wait 15 seconds after standing to begin walking.  If balance is a continuous problem, you might want  to consider a walking aid such as a cane, walking stick, or walker. Once you've mastered walking with help, you might be ready to try it on your own again.

## 2024-01-29 ENCOUNTER — Encounter (HOSPITAL_BASED_OUTPATIENT_CLINIC_OR_DEPARTMENT_OTHER): Payer: Self-pay

## 2024-01-29 ENCOUNTER — Other Ambulatory Visit (HOSPITAL_BASED_OUTPATIENT_CLINIC_OR_DEPARTMENT_OTHER): Payer: Self-pay

## 2024-01-29 ENCOUNTER — Other Ambulatory Visit: Payer: Self-pay | Admitting: Family Medicine

## 2024-01-29 ENCOUNTER — Ambulatory Visit (HOSPITAL_BASED_OUTPATIENT_CLINIC_OR_DEPARTMENT_OTHER)
Admission: RE | Admit: 2024-01-29 | Discharge: 2024-01-29 | Disposition: A | Discharge status: Home / Self Care | Payer: HMO | Source: Ambulatory Visit | Attending: Family Medicine | Admitting: Family Medicine

## 2024-01-29 DIAGNOSIS — R911 Solitary pulmonary nodule: Secondary | ICD-10-CM | POA: Insufficient documentation

## 2024-01-29 DIAGNOSIS — R918 Other nonspecific abnormal finding of lung field: Secondary | ICD-10-CM | POA: Diagnosis not present

## 2024-01-29 DIAGNOSIS — G8929 Other chronic pain: Secondary | ICD-10-CM

## 2024-01-29 DIAGNOSIS — J929 Pleural plaque without asbestos: Secondary | ICD-10-CM | POA: Diagnosis not present

## 2024-01-29 DIAGNOSIS — I700 Atherosclerosis of aorta: Secondary | ICD-10-CM | POA: Diagnosis not present

## 2024-01-29 DIAGNOSIS — R0602 Shortness of breath: Secondary | ICD-10-CM | POA: Diagnosis not present

## 2024-01-29 MED ORDER — TIZANIDINE HCL 4 MG PO TABS
4.0000 mg | ORAL_TABLET | Freq: Four times a day (QID) | ORAL | 1 refills | Status: DC | PRN
  Filled 2024-01-29: qty 30, 8d supply, fill #0
  Filled 2024-02-16: qty 30, 8d supply, fill #1

## 2024-02-03 ENCOUNTER — Other Ambulatory Visit (HOSPITAL_BASED_OUTPATIENT_CLINIC_OR_DEPARTMENT_OTHER): Payer: Self-pay

## 2024-02-03 ENCOUNTER — Encounter: Payer: Self-pay | Admitting: Pulmonary Disease

## 2024-02-03 ENCOUNTER — Ambulatory Visit: Payer: HMO | Admitting: Pulmonary Disease

## 2024-02-03 VITALS — BP 114/70 | HR 58 | Temp 97.6°F | Ht 71.0 in | Wt 205.0 lb

## 2024-02-03 DIAGNOSIS — R911 Solitary pulmonary nodule: Secondary | ICD-10-CM | POA: Diagnosis not present

## 2024-02-03 DIAGNOSIS — R0602 Shortness of breath: Secondary | ICD-10-CM

## 2024-02-03 DIAGNOSIS — I272 Pulmonary hypertension, unspecified: Secondary | ICD-10-CM

## 2024-02-03 MED ORDER — PREDNISONE 20 MG PO TABS
20.0000 mg | ORAL_TABLET | Freq: Every day | ORAL | 0 refills | Status: DC
  Filled 2024-02-03: qty 7, 7d supply, fill #0

## 2024-02-03 MED ORDER — AZITHROMYCIN 250 MG PO TABS
ORAL_TABLET | ORAL | 0 refills | Status: DC
  Filled 2024-02-03: qty 6, 5d supply, fill #0

## 2024-02-03 NOTE — Patient Instructions (Signed)
 Follow-up in about 6 weeks  Echocardiogram-to check the structure of the heart-ordered  Prescription for antibiotics-azithromycin  to be used for 5 days Prescription for steroids to be used for 5 to 7 days  You will need a CT scan in about a year to check on the spot at the base of the lung  Call us  with significant concerns

## 2024-02-03 NOTE — Progress Notes (Signed)
 Ronald Petersen    [id number]    02-Jan-1956  Primary Care Physician:Lowne Cyndee [name], DO  Referring Physician: Antonio Cyndee, [name], DO [address] [place],  [place] [address]  Chief complaint:   Patient being seen for acute visit  HPI:  Shortness of breath over the last 2 to 3 weeks He does have a cough  Shortness of breath usually in the evenings when he is trying to lay down  Occasional leg swelling  History of heart surgery, pleural effusion, coronary artery disease, unstable angina  His sugars have been waxing and waning recently  Recently saw his primary care doctor  History of diabetes, GERD, hyperlipidemia History of obstructive sleep apnea-not on CPAP therapy CABG times 29 November 2021  Quit smoking in 1987  Previous PFT with mixed obstruction and restriction  Outpatient Encounter Medications as of 02/03/2024  Medication Sig   acetaminophen  (TYLENOL ) 650 MG CR tablet Take 650 mg by mouth every 8 (eight) hours as needed for pain.   aspirin  EC 325 MG EC tablet Take 1 tablet (325 mg total) by mouth daily.   azithromycin  (ZITHROMAX  Z-PAK) 250 MG tablet Take 2 tablets by mouth on day 1 and then take 1 daily for 4 days   Bempedoic Acid -Ezetimibe  (NEXLIZET ) 180-10 MG TABS Take 1 tablet by mouth daily. (Replaces ezetimibe  10mg )   ELDERBERRY PO Take 300 mg by mouth daily.   empagliflozin  (JARDIANCE ) 25 MG TABS tablet Take 1 tablet (25 mg total) by mouth daily before breakfast.   famotidine  (PEPCID ) 20 MG tablet Take 1 tablet (20 mg total) by mouth daily.   fenofibrate  160 MG tablet Take 1 tablet (160 mg total) by mouth daily.   gabapentin  (NEURONTIN ) 300 MG capsule Take 3 capsules (900 mg total) by mouth 3 (three) times daily.   glucose blood (ONETOUCH VERIO) test strip USE AS INSTRUCTED TO CHECK BLOOD SUGAR ONCE A DAY   HYDROcodone -acetaminophen  (NORCO/VICODIN) 5-325 MG tablet Take 1 tablet by mouth every 8 (eight) hours as needed.    insulin  degludec (TRESIBA  FLEXTOUCH) 100 UNIT/ML FlexTouch Pen Inject 10 Units into the skin daily. 10 u sq every day   Insulin  Pen Needle (TRUEPLUS 5-BEVEL PEN NEEDLES) 31G X 5 MM MISC Use to inject insulin    Magnesium  Citrate 200 MG TABS Take 400 mg by mouth daily.   ondansetron  (ZOFRAN ) 4 MG tablet Take 1 tablet (4 mg total) by mouth every 8 (eight) hours as needed.   pantoprazole  (PROTONIX ) 40 MG tablet Take 1 tablet (40 mg total) by mouth 2 (two) times daily.   predniSONE  (DELTASONE ) 20 MG tablet Take 1 tablet (20 mg total) by mouth daily with breakfast.   sertraline  (ZOLOFT ) 100 MG tablet Take 1.5 tablets (150 mg total) by mouth daily.   tiZANidine  (ZANAFLEX ) 4 MG tablet Take 1 tablet (4 mg total) by mouth every 6 (six) hours as needed for muscle spasms.   traZODone  (DESYREL ) 50 MG tablet Take 1 tablet (50 mg total) by mouth at bedtime as needed for sleep.   umeclidinium-vilanterol (ANORO ELLIPTA ) 62.5-25 MCG/ACT AEPB Inhale 1 puff into the lungs daily.   vitamin E  180 MG (400 UNITS) capsule Take 1,600 Units by mouth daily.   No facility-administered encounter medications on file as of 02/03/2024.    Allergies as of 02/03/2024 - Review Complete 02/03/2024  Allergen Reaction Noted   Niacin Anaphylaxis 12/08/2006    Past Medical History:  Diagnosis Date  Allergy    Anxiety    Arthritis    CAD (coronary artery disease)    Cancer (HCC)    CHF (congestive heart failure) (HCC)    Complication of anesthesia    aspirated with back surgery at age 68   Depression    Diabetes mellitus Type II    GERD (gastroesophageal reflux disease)    Hyperlipidemia    Hypertension    Mild aortic stenosis    Mild carotid artery disease (HCC)    Neuromuscular disorder (HCC)    NEUROPATHY   Plavix  resistance    abnormal cytochrome P450 2c19 genotype in 2021 inferring suboptimal Plavix  platelet inhibition,   Right rotator cuff tear 11/23/2018   Sleep apnea    no CPAP    Past Surgical History:   Procedure Laterality Date   APPENDECTOMY     ARTHOSCOPIC ROTAOR CUFF REPAIR Right 11/23/2018   Procedure: ARTHROSCOPIC ROTATOR CUFF REPAIR;  Surgeon: Josefina Chew, MD;  Location: [facility];  Service: Orthopedics;  Laterality: Right;   CARDIAC VALVE REPLACEMENT     CHOLECYSTECTOMY     CORONARY ARTERY BYPASS GRAFT N/A 12/24/2021   x2 LIMA to LAD; SVG to Obtuse Marginal   CORONARY STENT INTERVENTION N/A 11/19/2020   Procedure: CORONARY STENT INTERVENTION;  Surgeon: Mady Bruckner, MD;  Location: [facility];  Service: Cardiovascular;  Laterality: N/A;   CORONARY ULTRASOUND/IVUS N/A 11/19/2020   Procedure: Intravascular Ultrasound/IVUS;  Surgeon: Mady Bruckner, MD;  Location: [facility];  Service: Cardiovascular;  Laterality: N/A;   ENDOVEIN HARVEST OF GREATER SAPHENOUS VEIN Right 12/24/2021   Procedure: ENDOVEIN HARVEST OF GREATER SAPHENOUS VEIN;  Surgeon: Shyrl Linnie KIDD, MD;  Location: [facility] OR;  Service: Open Heart Surgery;  Laterality: Right;   FRACTURE SURGERY     IR THORACENTESIS ASP PLEURAL SPACE W/IMG GUIDE  01/31/2022   LEFT HEART CATH AND CORONARY ANGIOGRAPHY N/A 11/19/2020   Procedure: LEFT HEART CATH AND CORONARY ANGIOGRAPHY;  Surgeon: Mady Bruckner, MD;  Location: [facility];  Service: Cardiovascular;  Laterality: N/A;   LEFT HEART CATH AND CORONARY ANGIOGRAPHY N/A 12/24/2020   Procedure: LEFT HEART CATH AND CORONARY ANGIOGRAPHY;  Surgeon: Jordan, Peter M, MD;  [facility] IN[facility] CV [facility];  Service: Cardiovascular;  Laterality: N/A;   LEFT HEART CATH AND CORONARY ANGIOGRAPHY N/A 12/16/2021   Procedure: LEFT HEART CATH AND CORONARY ANGIOGRAPHY;  Surgeon: Anner Alm ORN, MD;  Location: [facility] INVASIVE CV LAB;  Service: Cardiovascular;  Laterality: N/A;   LEFT HEART CATH AND CORS/GRAFTS ANGIOGRAPHY N/A 03/18/2023   Procedure: LEFT HEART CATH AND CORS/GRAFTS ANGIOGRAPHY;  Surgeon: Dann Candyce RAMAN, MD;  Location: [facility] INVAS[facility] CV LAB;   Service: Cardiovascular;  Laterality: N/A;   LUMBAR LAMINECTOMY     SHOULDER ARTHROSCOPY WITH ROTATOR CUFF REPAIR AND SUBACROMIAL DECOMPRESSION Right 11/23/2018   Procedure: SHOULDER ARTHROSCOPY WITH ROTATOR CUFF REPAIR AND SUBACROMIAL DECOMPRESSION;  Surgeon: Josefina Chew, MD;  Location: [facility];  Service: Orthopedics;  Laterality: Right;   SPINE SURGERY     TEE WITHOUT CARDIOVERSION N/A 12/24/2021   Procedure: TRANSESOPHAGEAL ECHOCARDIOGRAM (TEE);  Surgeon: Shyrl Linnie KIDD, MD;  Location: [facility] OR;  Service: Open Heart Surgery;  Laterality: N/A;    Family History  Problem Relation Age of Onset   COPD Mother    Stroke Father    Ovarian cancer Sister    Stomach cancer Sister    Heart disease Sister        MI  Heart disease Sister        MI   Stomach cancer Maternal Grandmother    Alcohol abuse Other    Depression Other    Arthritis Other    Hypertension Other    Coronary artery disease Other    Ovarian cancer Other        neice   Ovarian cancer Other        neice   Colon polyps Neg Hx    Colon cancer Neg Hx     Social History   Socioeconomic History   Marital status: Married    Spouse name: Shanda   Number of children: 2   Years of education: Not on file   Highest education level: GED or equivalent  Occupational History   Occupation: self employed    Associate Professor: UNEMPLOYED  Tobacco Use   Smoking status: Former    Current packs/day: 0.00    Types: Cigarettes    Start date: 12/29/1969    Quit date: 12/29/1985    Years since quitting: 38.1   Smokeless tobacco: Never  Vaping Use   Vaping status: Never Used  Substance and Sexual Activity   Alcohol use: Not Currently   Drug use: No   Sexual activity: Yes    Partners: Female  Other Topics Concern   Not on file  Social History Narrative   Exercise-- 3 days   Pt has hs degree   Right handed   One story home   Drinks caffeine 2 cups in am   Are you right handed or left handed?    Are you  currently employed ?    What is your current occupation? retired   Do you live at home alone?   Who lives with you?  wife   What type of home do you live in: 1 story or 2 story?        Social Drivers of Health   Financial Resource Strain: High Risk (12/15/2023)   Overall Financial Resource Strain (CARDIA)    Difficulty of Paying Living Expenses: Hard  Food Insecurity: Food Insecurity Present (12/15/2023)   Hunger Vital Sign    Worried About Running Out of Food in the Last Year: Sometimes true    Ran Out of Food in the Last Year: Sometimes true  Transportation Needs: No Transportation Needs (12/15/2023)   PRAPARE - Administrator, Civil Service (Medical): No    Lack of Transportation (Non-Medical): No  Physical Activity: Insufficiently Active (12/15/2023)   Exercise Vital Sign    Days of Exercise per Week: 2 days    Minutes of Exercise per Session: 20 min  Stress: No Stress Concern Present (12/15/2023)   [employer] of Occupational Health - Occupational Stress Questionnaire    Feeling of Stress : Only a little  Social Connections: Unknown (12/15/2023)   Social Connection and Isolation Panel [NHANES]    Frequency of Communication with Friends and Family: Three times a week    Frequency of Social Gatherings with Friends and Family: Three times a week    Attends Religious Services: More than 4 times per year    Active Member of Clubs or Organizations: Patient declined    Attends Banker Meetings: Not on file    Marital Status: Married  Intimate Partner Violence: Not At Risk (03/17/2023)   Humiliation, Afraid, Rape, and Kick questionnaire    Fear of Current or Ex-Partner: No    Emotionally Abused: No    Physically Abused: No  Sexually Abused: No    Review of Systems  Constitutional:  Negative for fever.  Respiratory:  Positive for cough and shortness of breath.   Psychiatric/Behavioral:  Negative for sleep disturbance.     Vitals:   02/03/24  0906  BP: 114/70  Pulse: (!) 58  Temp: 97.6 F (36.4 C)  SpO2: 100%     Physical Exam Constitutional:      Appearance: Normal appearance.  HENT:     Head: Normocephalic.     Mouth/Throat:     Mouth: Mucous membranes are moist.  Eyes:     General: No scleral icterus. Cardiovascular:     Rate and Rhythm: Normal rate and regular rhythm.     Heart sounds: No murmur heard.    No friction rub.  Pulmonary:     Effort: No respiratory distress.     Breath sounds: No stridor. No wheezing or rhonchi.  Musculoskeletal:     Cervical back: No rigidity.  Neurological:     Mental Status: He is alert.  Psychiatric:        Mood and Affect: Mood normal.    Data Reviewed: CT scan 01/29/2024 reviewed showing left lower lobe nodule Pleural thickening  Previous echocardiogram from 2022 December with mild pulmonary hypertension, normal ejection fraction  Assessment:  Shortness of breath, cough, Infection may be related to respiratory  Shortness of breath at rest and exertion  Mild pulmonary hypertension  History of coronary artery disease   Plan/Recommendations: Prescription for azithromycin  sent to pharmacy  Prescription for prednisone  to be used for 5 to 7 days  CT scan of the chest to be ordered for a year from now  Schedule for echocardiogram  Tentative follow-up in about 6 weeks  Encouraged to call with significant concerns  I spent 30 minutes dedicated to the care of this patient on the date of this encounter to include previsit review of records, face-to-face time with the patient discussing conditions above, post visit ordering of testing,ordering medications,independentlyinterpreting results, clinical documentation with electronic health record and communicated necessary findings to members of the patient's care team   Jennet Epley MD  Pulmonary and Critical Care 02/03/2024, 9:48 AM  CC: Antonio Cyndee Jamee JONELLE, *

## 2024-02-04 ENCOUNTER — Other Ambulatory Visit: Payer: Self-pay

## 2024-02-04 ENCOUNTER — Encounter: Payer: Self-pay | Admitting: Family Medicine

## 2024-02-04 ENCOUNTER — Telehealth: Payer: Self-pay

## 2024-02-04 NOTE — Patient Outreach (Signed)
  Care Management   Visit Note  02/04/2024 Name: AHAMED HOFLAND MRN: [id number] DOB: 1956/05/25  Subjective: KEIONDRE COLEE is a 68 y.o. year old male who is a primary care patient of Lowne Chase, Yvonne R, DO. The Care Management team was consulted for assistance.      Mr. Correll returned call. Anticipates being available to complete care management outreach on 02/26/24. Agreed to call RNCM or contact clinic if he requires assistance prior to the scheduled outreach.   PLAN Appointment scheduled for 02/26/24    Jackson Acron [facility] Population Health RN Care Manager Direct Dial: [phone]  Fax: [phone] Website: [email or web address]

## 2024-02-04 NOTE — Patient Outreach (Signed)
  Care Management   Outreach Note  02/04/2024 Name: Ronald Petersen MRN: [id number] DOB: 1956-06-04  An unsuccessful telephone outreach was attempted today to contact the patient about Care Management needs.     Follow Up Plan:  A HIPAA compliant phone message was left for the patient providing contact information and requesting a return call.    Jackson Acron [facility] Population Health RN Care Manager Direct Dial: [phone]  Fax: [phone] Website: [email or web address]

## 2024-02-10 ENCOUNTER — Other Ambulatory Visit: Payer: Self-pay | Admitting: Family Medicine

## 2024-02-10 ENCOUNTER — Ambulatory Visit (HOSPITAL_COMMUNITY): Payer: PPO | Attending: Pulmonary Disease

## 2024-02-10 DIAGNOSIS — R0602 Shortness of breath: Secondary | ICD-10-CM | POA: Insufficient documentation

## 2024-02-10 DIAGNOSIS — M4802 Spinal stenosis, cervical region: Secondary | ICD-10-CM

## 2024-02-10 LAB — ECHOCARDIOGRAM COMPLETE
AR max vel: 1.62 cm2
AV Area VTI: 1.83 cm2
AV Area mean vel: 1.45 cm2
AV Mean grad: 9 mm[Hg]
AV Peak grad: 17 mm[Hg]
Ao pk vel: 2.06 m/s
Area-P 1/2: 4.74 cm2
S' Lateral: 2.7 cm

## 2024-02-10 MED ORDER — HYDROCODONE-ACETAMINOPHEN 5-325 MG PO TABS
1.0000 | ORAL_TABLET | Freq: Three times a day (TID) | ORAL | 0 refills | Status: DC | PRN
  Filled 2024-02-10: qty 45, 15d supply, fill #0

## 2024-02-10 NOTE — Telephone Encounter (Signed)
Requesting: Norco 5-325 Contract: 06/05/2023 UDS: 06/05/2023 Last Visit: 01/19/2024 Next Visit: N/A Last Refill: 12/31/2023  Please Advise

## 2024-02-11 ENCOUNTER — Other Ambulatory Visit: Payer: Self-pay

## 2024-02-11 ENCOUNTER — Other Ambulatory Visit (HOSPITAL_BASED_OUTPATIENT_CLINIC_OR_DEPARTMENT_OTHER): Payer: Self-pay

## 2024-02-17 ENCOUNTER — Other Ambulatory Visit (HOSPITAL_BASED_OUTPATIENT_CLINIC_OR_DEPARTMENT_OTHER): Payer: Self-pay

## 2024-02-24 ENCOUNTER — Telehealth: Payer: Self-pay | Admitting: Emergency Medicine

## 2024-02-24 ENCOUNTER — Other Ambulatory Visit: Payer: Self-pay | Admitting: Family Medicine

## 2024-02-24 DIAGNOSIS — G4700 Insomnia, unspecified: Secondary | ICD-10-CM

## 2024-02-24 NOTE — Telephone Encounter (Signed)
 Copied from CRM [id number]. Topic: General - Other >> Feb 24, 2024 11:17 AM Denese Killings wrote: Reason for CRM: Patient stated that [employer] advised that they will now need a verbal regarding patient diagnosis. They stated that they are not receiving  it by fax. Patient needs this done before 02/26/2024 or the insurance will be cancelled. He stated that type of information is 2 requirements to have that particular plan.

## 2024-02-24 NOTE — Telephone Encounter (Signed)
 Called [employer] and advised of information needed.

## 2024-02-25 ENCOUNTER — Ambulatory Visit: Payer: PPO | Attending: Cardiology | Admitting: Cardiology

## 2024-02-25 ENCOUNTER — Encounter: Payer: Self-pay | Admitting: Cardiology

## 2024-02-25 ENCOUNTER — Other Ambulatory Visit (HOSPITAL_BASED_OUTPATIENT_CLINIC_OR_DEPARTMENT_OTHER): Payer: Self-pay

## 2024-02-25 VITALS — BP 90/60 | HR 62 | Ht 71.0 in | Wt 203.0 lb

## 2024-02-25 DIAGNOSIS — G4733 Obstructive sleep apnea (adult) (pediatric): Secondary | ICD-10-CM

## 2024-02-25 DIAGNOSIS — I2510 Atherosclerotic heart disease of native coronary artery without angina pectoris: Secondary | ICD-10-CM

## 2024-02-25 DIAGNOSIS — E785 Hyperlipidemia, unspecified: Secondary | ICD-10-CM | POA: Diagnosis not present

## 2024-02-25 DIAGNOSIS — E11649 Type 2 diabetes mellitus with hypoglycemia without coma: Secondary | ICD-10-CM

## 2024-02-25 DIAGNOSIS — E1169 Type 2 diabetes mellitus with other specified complication: Secondary | ICD-10-CM

## 2024-02-25 MED ORDER — TRAZODONE HCL 50 MG PO TABS
50.0000 mg | ORAL_TABLET | Freq: Every evening | ORAL | 1 refills | Status: DC | PRN
  Filled 2024-02-25: qty 90, 90d supply, fill #0
  Filled 2024-05-24: qty 90, 90d supply, fill #1

## 2024-02-25 MED ORDER — NITROGLYCERIN 0.4 MG SL SUBL
0.4000 mg | SUBLINGUAL_TABLET | SUBLINGUAL | 3 refills | Status: DC | PRN
  Filled 2024-02-25: qty 100, 30d supply, fill #0
  Filled 2024-06-22: qty 100, 30d supply, fill #1
  Filled 2024-07-21: qty 100, 30d supply, fill #2

## 2024-02-25 MED ORDER — RANOLAZINE ER 500 MG PO TB12
500.0000 mg | ORAL_TABLET | Freq: Two times a day (BID) | ORAL | 3 refills | Status: DC
  Filled 2024-02-25: qty 180, 90d supply, fill #0
  Filled 2024-05-18: qty 180, 90d supply, fill #1
  Filled 2024-08-16: qty 180, 90d supply, fill #2
  Filled 2024-11-14: qty 180, 90d supply, fill #3

## 2024-02-25 NOTE — Patient Instructions (Signed)
 Medication Instructions:  - START RANEXA 500MG  TWICE DAILY     *If you need a refill on your cardiac medications before your next appointment, please call your pharmacy*   Lab Work: NONE    If you have labs (blood work) drawn today and your tests are completely normal, you will receive your results only by: MyChart Message (if you have MyChart) OR A paper copy in the mail If you have any lab test that is abnormal or we need to change your treatment, we will call you to review the results.   Testing/Procedures: NONE    Follow-Up: At [facility], you and your health needs are our priority.  As part of our continuing mission to provide you with exceptional heart care, we have created designated Provider Care Teams.  These Care Teams include your primary Cardiologist (physician) and Advanced Practice Providers (APPs -  Physician Assistants and Nurse Practitioners) who all work together to provide you with the care you need, when you need it.  We recommend signing up for the patient portal called "MyChart".  Sign up information is provided on this After Visit Summary.  MyChart is used to connect with patients for Virtual Visits (Telemedicine).  Patients are able to view lab/test results, encounter notes, upcoming appointments, etc.  Non-urgent messages can be sent to your provider as well.   To learn more about what you can do with MyChart, go to [email or web address].    Your next appointment:   6 month(s)  The format for your next appointment:   In Person  Provider:   Thomasene Ripple, DO    Other Instructions Dr. Servando Salina has referred you to [facility] for weightloss management with [employer].

## 2024-02-25 NOTE — Progress Notes (Signed)
 Cardiology Office Note:    Date:  02/25/2024   ID:  Ronald Petersen, DOB 19-Oct-1956, MRN [id number]  PCP:  [name], Grayling Congress, DO  Cardiologist:  Thomasene Ripple, DO  Electrophysiologist:  None   Referring MD: [name], Grayling Congress, *   " I am having some shortness of breath and intermittent chest pain"  History of Present Illness:    Ronald Petersen is a 68 y.o. 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     Since his last visit with me in April 2024 he has been doing well from a cardiovascular standpoint.  But notes recently he has had significant fatigue and shortness of breath.  He did see pulmonary who ordered an echocardiogram.  His echocardiogram showed mild aortic stenosis otherwise no other issues.   He is here today with his wife Ronald Petersen reports that he has been experiencing a lot of chest pain recently. The patient's blood pressure has been fluctuating, and his blood sugar levels have been unstable, ranging from 300 to 98. The patient is currently on insulin. The patient's last cardiac catheterization was in 2024 with no indication for PCI/intervention.     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 2c19 genotype in 2021 inferring suboptimal Plavix platelet inhibition,   Right rotator cuff tear 11/23/2018   Sleep apnea    no CPAP    Past Surgical History:  Procedure Laterality Date   APPENDECTOMY     ARTHOSCOPIC ROTAOR CUFF REPAIR Right 11/23/2018   Procedure: ARTHROSCOPIC ROTATOR CUFF REPAIR;  Surgeon: Teryl Lucy, MD;  Location:  SUR[facility]ERY CENTER;  Service: Orthopedics;  Laterality: Right;   CARDIAC VALVE REPLACEMENT     CHOLECYSTECTOMY     CORONARY ARTERY BYPASS GRAFT N/A 12/24/2021   x2 LIMA to LAD; SVG to Obtuse Marginal   CORONARY STENT INTERVENTION N/A 11/19/2020   Procedure: CORONARY STENT INTERVENTION;  Surgeon: Yvonne Kendall, MD;  Location: [facility];  Service: Cardiovascular;  Laterality: N/A;   CORONARY ULTRASOUND/IVUS N/A 11/19/2020   Procedure: Intravascular Ultrasound/IVUS;  Surgeon: Yvonne Kendall, MD;  Location: [facility];  Service: Cardiovascular;  Laterality: N/A;   ENDOVEIN HARVEST OF GREATER SAPHENOUS VEIN Right 12/24/2021   Procedure: ENDOVEIN HARVEST OF GREATER SAPHENOUS VEIN;  Surgeon: Corliss Skains, MD;  Location: [facility] OR;  Service: Open Heart Surgery;  Laterality: Right;   FRACTURE SURGERY     IR THORACENTESIS ASP PLEURAL SPACE W/IMG GUIDE  01/31/2022   LEFT HEART CATH AND CORONARY ANGIOGRAPHY N/A 11/19/2020   Procedure: LEFT HEART CATH AND CORONARY ANGIOGRAPHY;  Surgeon: Yvonne Kendall, MD;  Location: [facility];  Service: Cardiovascular;  Laterality: N/A;   LEFT HEART CATH AND CORONARY ANGIOGRAPHY N/A 12/24/2020   Procedure: LEFT HEART CATH AND CORONARY ANGIOGRAPHY;  Surgeon: Swaziland, Peter M, MD;  Location: [facility] INVASIVE CV LAB;  Service: Cardiovascular;  Laterality: N/A;   LEFT HEART CATH AND CORONARY  ANGIOGRAPHY N/A 12/16/2021   Procedure: LEFT HEART CATH AND CORONARY ANGIOGRAPHY;  Surgeon: Marykay Lex, MD;  Location: [facility] INVASIVE CV LAB;  Service: Cardiovascular;  Laterality: N/A;   LEFT HEART CATH AND CORS/GRAFTS ANGIOGRAPHY N/A 03/18/2023   Procedure: LEFT HEART CATH AND CORS/GRAFTS ANGIOGRAPHY;  Surgeon: Corky Crafts, MD;  Location: [facility] INVASIVE CV LAB;  Service: Cardiovascular;  Laterality: N/A;   LUMBAR LAMINECTOMY     SHOULDER ARTHROSCOPY WITH ROTATOR CUFF REPAIR AND SUBACROMIAL DECOMPRESSION Right 11/23/2018   Procedure: SHOULDER ARTHROSCOPY WITH ROTATOR CUFF REPAIR AND SUBACROMIAL DECOMPRESSION;  Surgeon: Teryl Lucy, MD;  Location: [facility];  Service: Orthopedics;  Laterality: Right;   SPINE SURGERY     TEE WITHOUT CARDIOVERSION N/A 12/24/2021   Procedure: TRANSESOPHAGEAL ECHOCARDIOGRAM (TEE);  Surgeon: Corliss Skains, MD;  Location: [facility] OR;  Service: Open Heart Surgery;  Laterality: N/A;    Current Medications: Current Meds  Medication Sig   acetaminophen (TYLENOL) 650 MG CR tablet Take 650 mg by mouth every 8 (eight) hours as needed for pain.   aspirin EC 325 MG EC tablet Take 1 tablet (325 mg total) by mouth daily.   azithromycin (ZITHROMAX Z-PAK) 250 MG tablet Take 2 tablets by mouth on day 1 and then take 1 daily for 4 days   Bempedoic Acid-Ezetimibe (NEXLIZET) 180-10 MG TABS Take 1 tablet by mouth daily. (Replaces ezetimibe 10mg )   ELDERBERRY PO Take 300 mg by mouth daily.   empagliflozin (JARDIANCE) 25 MG TABS tablet Take 1 tablet (25 mg total) by mouth daily before breakfast.   famotidine (PEPCID) 20 MG tablet Take 1 tablet (20 mg total) by mouth daily.   fenofibrate 160 MG tablet Take 1 tablet (160 mg total) by mouth daily.   gabapentin (NEURONTIN) 300 MG capsule Take 3 capsules (900 mg total) by mouth 3 (three) times daily.   glucose blood (ONETOUCH VERIO) test strip USE AS INSTRUCTED TO CHECK BLOOD SUGAR ONCE A DAY    HYDROcodone-acetaminophen (NORCO/VICODIN) 5-325 MG tablet Take 1 tablet by mouth every 8 (eight) hours as needed.   insulin degludec (TRESIBA FLEXTOUCH) 100 UNIT/ML FlexTouch Pen Inject 10 Units into the skin daily. 10 u sq every day   Insulin Pen Needle (TRUEPLUS 5-BEVEL PEN NEEDLES) 31G X 5 MM MISC Use to inject insulin   Magnesium Citrate 200 MG TABS Take 400 mg by mouth daily.   nitroGLYCERIN (NITROSTAT) 0.4 MG SL tablet Place 1 tablet (0.4 mg total) under the tongue every 5 (five) minutes as needed for chest pain.   ondansetron (ZOFRAN) 4 MG tablet Take 1 tablet (4 mg total) by mouth every 8 (eight) hours as needed.   pantoprazole (PROTONIX) 40 MG tablet Take 1 tablet (40 mg total) by mouth 2 (two) times daily.   predniSONE (DELTASONE) 20 MG tablet Take 1 tablet (20 mg total) by mouth daily with breakfast.  ranolazine (RANEXA) 500 MG 12 hr tablet Take 1 tablet (500 mg total) by mouth 2 (two) times daily.   sertraline (ZOLOFT) 100 MG tablet Take 1.5 tablets (150 mg total) by mouth daily.   tiZANidine (ZANAFLEX) 4 MG tablet Take 1 tablet (4 mg total) by mouth every 6 (six) hours as needed for muscle spasms.   traZODone (DESYREL) 50 MG tablet Take 1 tablet (50 mg total) by mouth at bedtime as needed for sleep.   umeclidinium-vilanterol (ANORO ELLIPTA) 62.5-25 MCG/ACT AEPB Inhale 1 puff into the lungs daily.   vitamin E 180 MG (400 UNITS) capsule Take 1,600 Units by mouth daily.     Allergies:   Niacin   Social History   Socioeconomic History   Marital status: Married    Spouse name: Erie Noe   Number of children: 2   Years of education: Not on file   Highest education level: GED or equivalent  Occupational History   Occupation: self employed    Associate Professor: UNEMPLOYED  Tobacco Use   Smoking status: Former    Current packs/day: 0.00    Types: Cigarettes    Start date: 12/29/1969    Quit date: 12/29/1985    Years since quitting: 38.1   Smokeless tobacco: Never  Vaping Use   Vaping  status: Never Used  Substance and Sexual Activity   Alcohol use: Not Currently   Drug use: No   Sexual activity: Yes    Partners: Female  Other Topics Concern   Not on file  Social History Narrative   Exercise-- 3 days   Pt has hs degree   Right handed   One story home   Drinks caffeine 2 cups in am   Are you right handed or left handed?    Are you currently employed ?    What is your current occupation? retired   Do you live at home alone?   Who lives with you?  wife   What type of home do you live in: 1 story or 2 story?        Social Drivers of Health   Financial Resource Strain: High Risk (12/15/2023)   Overall Financial Resource Strain (CARDIA)    Difficulty of Paying Living Expenses: Hard  Food Insecurity: Food Insecurity Present (12/15/2023)   Hunger Vital Sign    Worried About Running Out of Food in the Last Year: Sometimes true    Ran Out of Food in the Last Year: Sometimes true  Transportation Needs: No Transportation Needs (12/15/2023)   PRAPARE - Administrator, Civil Service (Medical): No    Lack of Transportation (Non-Medical): No  Physical Activity: Insufficiently Active (12/15/2023)   Exercise Vital Sign    Days of Exercise per Week: 2 days    Minutes of Exercise per Session: 20 min  Stress: No Stress Concern Present (12/15/2023)   [employer] of Occupational Health - Occupational Stress Questionnaire    Feeling of Stress : Only a little  Social Connections: Unknown (12/15/2023)   Social Connection and Isolation Panel [NHANES]    Frequency of Communication with Friends and Family: Three times a week    Frequency of Social Gatherings with Friends and Family: Three times a week    Attends Religious Services: More than 4 times per year    Active Member of Clubs or Organizations: Patient declined    Attends Banker Meetings: Not on file    Marital Status: Married     Family History: The  patient's family history includes  Alcohol abuse in an other family member; Arthritis in an other family member; COPD in his mother; Coronary artery disease in an other family member; Depression in an other family member; Heart disease in his sister and sister; Hypertension in an other family member; Ovarian cancer in his sister and other family members; Stomach cancer in his maternal grandmother and sister; Stroke in his father. There is no history of Colon polyps or Colon cancer.  ROS:   Review of Systems  Constitution: Negative for decreased appetite, fever and weight gain.  HENT: Negative for congestion, ear discharge, hoarse voice and sore throat.   Eyes: Negative for discharge, redness, vision loss in right eye and visual halos.  Cardiovascular: Negative for chest pain, dyspnea on exertion, leg swelling, orthopnea and palpitations.  Respiratory: Negative for cough, hemoptysis, shortness of breath and snoring.   Endocrine: Negative for heat intolerance and polyphagia.  Hematologic/Lymphatic: Negative for bleeding problem. Does not bruise/bleed easily.  Skin: Negative for flushing, nail changes, rash and suspicious lesions.  Musculoskeletal: Negative for arthritis, joint pain, muscle cramps, myalgias, neck pain and stiffness.  Gastrointestinal: Negative for abdominal pain, bowel incontinence, diarrhea and excessive appetite.  Genitourinary: Negative for decreased libido, genital sores and incomplete emptying.  Neurological: Negative for brief paralysis, focal weakness, headaches and loss of balance.  Psychiatric/Behavioral: Negative for altered mental status, depression and suicidal ideas.  Allergic/Immunologic: Negative for HIV exposure and persistent infections.    EKGs/Labs/Other Studies Reviewed:    The following studies were reviewed today:   EKG: Recent EKG reviewed   LHC 03/18/2023 Mid LM to Ost LAD lesion is 40% stenosed with 40% stenosed side branch in Ost Cx.  SVG to OM is widely patent.   Ost LAD to Prox  LAD lesion is 95% stenosed.  LIMA to LAD is widely patent.   1st Diag lesion is 25% stenosed.   RPAV lesion is 50% stenosed.  Small vessel.   Non-stenotic Prox LAD to Mid LAD lesion was previously treated.   Prior circumflex stent is widely patent.   Prior OM1 stent is patent.   The left ventricular systolic function is normal.   LV end diastolic pressure is normal.  LVEDP 8 mm Hg.   The left ventricular ejection fraction is 55-65% by visual estimate.   There is no aortic valve stenosis.   Continue aggressive secondary prevention.  Recent Labs: 03/17/2023: B Natriuretic Peptide 45.2 06/05/2023: TSH 1.22 01/19/2024: ALT 17; BUN 36; Creat 0.91; Hemoglobin 17.4; Platelets 181; Potassium 4.3; Sodium 137  Recent Lipid Panel    Component Value Date/Time   CHOL 222 (H) 01/19/2024 1752   TRIG 179 (H) 01/19/2024 1752   TRIG 148 12/10/2006 1603   HDL 43 01/19/2024 1752   CHOLHDL 5.2 (H) 01/19/2024 1752   VLDL 23.2 06/05/2023 1011   LDLCALC 148 (H) 01/19/2024 1752   LDLDIRECT 93.0 05/31/2020 0910    Physical Exam:    VS:  BP 90/60 (BP Location: Right Arm, Patient Position: Sitting, Cuff Size: Normal)   Pulse 62   Ht 5\' 11"  (1.803 m)   Wt 203 lb (92.1 kg)   SpO2 99%   BMI 28.31 kg/m     Wt Readings from Last 3 Encounters:  02/25/24 203 lb (92.1 kg)  02/03/24 205 lb (93 kg)  01/28/24 204 lb (92.5 kg)     GEN: Well nourished, well developed in no acute distress HEENT: Normal NECK: No JVD; No carotid bruits LYMPHATICS: No lymphadenopathy CARDIAC:  S1S2 noted,RRR, no murmurs, rubs, gallops RESPIRATORY:  Clear to auscultation without rales, wheezing or rhonchi  ABDOMEN: Soft, non-tender, non-distended, +bowel sounds, no guarding. EXTREMITIES: No edema, No cyanosis, no clubbing MUSCULOSKELETAL:  No deformity  SKIN: Warm and dry NEUROLOGIC:  Alert and oriented x 3, non-focal PSYCHIATRIC:  Normal affect, good insight  ASSESSMENT:    1. Type 2 diabetes mellitus with hypoglycemia  without coma, without long-term current use of insulin (HCC)   2. Coronary artery disease involving native coronary artery of native heart, unspecified whether angina present   3. OSA (obstructive sleep apnea)   4. Hyperlipidemia associated with type 2 diabetes mellitus (HCC)     PLAN:    Coronary artery disease-he reports intermittent chest pain.  He is not optimized on antianginals.  His blood pressure in the office today is on the lower side therefore I would like to avoid using Imdur in this patient, or any other antianginals that is going to impact his blood pressure significantly.  So I am left with Ranexa 500 mg twice daily will be able to monitor closely on this.   I am starting to be convinced that his fatigue and shortness of breath is highly in the setting of his untreated sleep apnea.  He has not been able to tolerate CPAP and he is not willing to go back on this.  I will refer the patient to our pharmacy team to consider Zepbound which also may help in the setting of his uncontrolled diabetes as well.    Blood pressure blood pressure in the 90s.  He is not on any blood pressure affecting medication we will continue to monitor the patient closely he is not lightheaded or dizzy.  As noted above the fatigue may be likely in the setting of his untreated sleep apnea.  If this is treated blood pressure remain on the lower side he may benefit from using low-dose of midodrine.   Hyperlipidemia - continue with current statin medication.  The patient is in agreement with the above plan. The patient left the office in stable condition.  The patient will follow up in 6 months or sooner if needed.   Medication Adjustments/Labs and Tests Ordered: Current medicines are reviewed at length with the patient today.  Concerns regarding medicines are outlined above.  Orders Placed This Encounter  Procedures   AMB Referral to [facility]   Meds ordered this encounter  Medications    ranolazine (RANEXA) 500 MG 12 hr tablet    Sig: Take 1 tablet (500 mg total) by mouth 2 (two) times daily.    Dispense:  180 tablet    Refill:  3   nitroGLYCERIN (NITROSTAT) 0.4 MG SL tablet    Sig: Place 1 tablet (0.4 mg total) under the tongue every 5 (five) minutes as needed for chest pain.    Dispense:  90 tablet    Refill:  3    Patient Instructions  Medication Instructions:  - START RANEXA 500MG  TWICE DAILY     *If you need a refill on your cardiac medications before your next appointment, please call your pharmacy*   Lab Work: NONE    If you have labs (blood work) drawn today and your tests are completely normal, you will receive your results only by: MyChart Message (if you have MyChart) OR A paper copy in the mail If you have any lab test that is abnormal or we need to change your treatment, we will call you to review the  results.   Testing/Procedures: NONE    Follow-Up: At [facility], you and your health needs are our priority.  As part of our continuing mission to provide you with exceptional heart care, we have created designated Provider Care Teams.  These Care Teams include your primary Cardiologist (physician) and Advanced Practice Providers (APPs -  Physician Assistants and Nurse Practitioners) who all work together to provide you with the care you need, when you need it.  We recommend signing up for the patient portal called "MyChart".  Sign up information is provided on this After Visit Summary.  MyChart is used to connect with patients for Virtual Visits (Telemedicine).  Patients are able to view lab/test results, encounter notes, upcoming appointments, etc.  Non-urgent messages can be sent to your provider as well.   To learn more about what you can do with MyChart, go to [email or web address].    Your next appointment:   6 month(s)  The format for your next appointment:   In Person  Provider:   Thomasene Ripple, DO    Other Instructions Dr. Servando Salina  has referred you to [facility] for weightloss management with ZEPBOUND.    Adopting a Healthy Lifestyle.  Know what a healthy weight is for you (roughly BMI <25) and aim to maintain this   Aim for 7+ servings of fruits and vegetables daily   65-80+ fluid ounces of water or unsweet tea for healthy kidneys   Limit to max 1 drink of alcohol per day; avoid smoking/tobacco   Limit animal fats in diet for cholesterol and heart health - choose grass fed whenever available   Avoid highly processed foods, and foods high in saturated/trans fats   Aim for low stress - take time to unwind and care for your mental health   Aim for 150 min of moderate intensity exercise weekly for heart health, and weights twice weekly for bone health   Aim for 7-9 hours of sleep daily   When it comes to diets, agreement about the perfect plan isnt easy to find, even among the experts. Experts at the [facility] of [employer] developed an idea known as the Healthy Eating Plate. Just imagine a plate divided into logical, healthy portions.   The emphasis is on diet quality:   Load up on vegetables and fruits - one-half of your plate: Aim for color and variety, and remember that potatoes dont count.   Go for whole grains - one-quarter of your plate: Whole wheat, barley, wheat berries, quinoa, oats, brown rice, and foods made with them. If you want pasta, go with whole wheat pasta.   Protein power - one-quarter of your plate: Fish, chicken, beans, and nuts are all healthy, versatile protein sources. Limit red meat.   The diet, however, does go beyond the plate, offering a few other suggestions.   Use healthy plant oils, such as olive, canola, soy, corn, sunflower and peanut. Check the labels, and avoid partially hydrogenated oil, which have unhealthy trans fats.   If youre thirsty, drink water. Coffee and tea are good in moderation, but skip sugary drinks and limit milk and dairy products to one or two daily  servings.   The type of carbohydrate in the diet is more important than the amount. Some sources of carbohydrates, such as vegetables, fruits, whole grains, and beans-are healthier than others.   Finally, stay active  Signed, Thomasene Ripple, DO  02/25/2024 2:36 PM    [facility]

## 2024-02-26 ENCOUNTER — Telehealth: Payer: Self-pay

## 2024-02-26 ENCOUNTER — Other Ambulatory Visit: Payer: Self-pay

## 2024-02-26 NOTE — Patient Outreach (Signed)
  Care Management   Outreach Note  02/26/2024 Name: Ronald Petersen MRN: [id number] DOB: Oct 25, 1956  An unsuccessful outreach attempt was made today for a scheduled Care Management visit.    Follow Up Plan:  A HIPAA compliant phone message was left for the patient providing contact information and requesting a return call.     Juanell Fairly [facility] Population Health RN Care Manager Direct Dial: [phone]  Fax: [phone] Website: [email or web address]

## 2024-03-01 DIAGNOSIS — H5201 Hypermetropia, right eye: Secondary | ICD-10-CM | POA: Diagnosis not present

## 2024-03-01 DIAGNOSIS — H524 Presbyopia: Secondary | ICD-10-CM | POA: Diagnosis not present

## 2024-03-01 DIAGNOSIS — E113293 Type 2 diabetes mellitus with mild nonproliferative diabetic retinopathy without macular edema, bilateral: Secondary | ICD-10-CM | POA: Diagnosis not present

## 2024-03-01 DIAGNOSIS — H5212 Myopia, left eye: Secondary | ICD-10-CM | POA: Diagnosis not present

## 2024-03-01 DIAGNOSIS — H52223 Regular astigmatism, bilateral: Secondary | ICD-10-CM | POA: Diagnosis not present

## 2024-03-01 LAB — HM DIABETES EYE EXAM

## 2024-03-02 ENCOUNTER — Other Ambulatory Visit: Payer: Self-pay | Admitting: Family Medicine

## 2024-03-02 ENCOUNTER — Other Ambulatory Visit (HOSPITAL_BASED_OUTPATIENT_CLINIC_OR_DEPARTMENT_OTHER): Payer: Self-pay

## 2024-03-02 DIAGNOSIS — G8929 Other chronic pain: Secondary | ICD-10-CM

## 2024-03-02 DIAGNOSIS — M4802 Spinal stenosis, cervical region: Secondary | ICD-10-CM

## 2024-03-02 MED ORDER — TIZANIDINE HCL 4 MG PO TABS
4.0000 mg | ORAL_TABLET | Freq: Four times a day (QID) | ORAL | 1 refills | Status: DC | PRN
  Filled 2024-03-02: qty 30, 8d supply, fill #0
  Filled 2024-03-15: qty 30, 8d supply, fill #1

## 2024-03-02 MED ORDER — HYDROCODONE-ACETAMINOPHEN 5-325 MG PO TABS
1.0000 | ORAL_TABLET | Freq: Three times a day (TID) | ORAL | 0 refills | Status: DC | PRN
  Filled 2024-03-02: qty 45, 15d supply, fill #0

## 2024-03-02 NOTE — Telephone Encounter (Signed)
 Requesting: hydrocodone 5-325mg  Contract: 06/05/23 UDS: 06/05/23 Last Visit: 01/19/24 Next Visit: None Last Refill: 02/10/24 #45 and 0RF   Please Advise

## 2024-03-15 ENCOUNTER — Other Ambulatory Visit: Payer: Self-pay | Admitting: Family Medicine

## 2024-03-15 ENCOUNTER — Other Ambulatory Visit (HOSPITAL_BASED_OUTPATIENT_CLINIC_OR_DEPARTMENT_OTHER): Payer: Self-pay

## 2024-03-15 ENCOUNTER — Other Ambulatory Visit: Payer: Self-pay

## 2024-03-15 MED ORDER — NEXLIZET 180-10 MG PO TABS
1.0000 | ORAL_TABLET | Freq: Every day | ORAL | 1 refills | Status: DC
  Filled 2024-03-15: qty 30, 30d supply, fill #0
  Filled 2024-04-26: qty 30, 30d supply, fill #1
  Filled 2024-06-01: qty 30, 30d supply, fill #2
  Filled 2024-07-05: qty 30, 30d supply, fill #3
  Filled 2024-08-11: qty 30, 30d supply, fill #4
  Filled 2024-09-19: qty 30, 30d supply, fill #5

## 2024-03-16 ENCOUNTER — Other Ambulatory Visit (HOSPITAL_BASED_OUTPATIENT_CLINIC_OR_DEPARTMENT_OTHER): Payer: Self-pay

## 2024-03-21 ENCOUNTER — Ambulatory Visit: Payer: PPO | Admitting: Gastroenterology

## 2024-03-21 ENCOUNTER — Other Ambulatory Visit (HOSPITAL_BASED_OUTPATIENT_CLINIC_OR_DEPARTMENT_OTHER): Payer: Self-pay

## 2024-03-21 ENCOUNTER — Encounter: Payer: Self-pay | Admitting: Gastroenterology

## 2024-03-21 VITALS — BP 108/72 | HR 50 | Ht 71.0 in | Wt 201.0 lb

## 2024-03-21 DIAGNOSIS — R1013 Epigastric pain: Secondary | ICD-10-CM | POA: Diagnosis not present

## 2024-03-21 DIAGNOSIS — T402X5A Adverse effect of other opioids, initial encounter: Secondary | ICD-10-CM | POA: Diagnosis not present

## 2024-03-21 DIAGNOSIS — R6881 Early satiety: Secondary | ICD-10-CM

## 2024-03-21 DIAGNOSIS — K219 Gastro-esophageal reflux disease without esophagitis: Secondary | ICD-10-CM | POA: Diagnosis not present

## 2024-03-21 DIAGNOSIS — K5903 Drug induced constipation: Secondary | ICD-10-CM | POA: Diagnosis not present

## 2024-03-21 DIAGNOSIS — R140 Abdominal distension (gaseous): Secondary | ICD-10-CM

## 2024-03-21 MED ORDER — FAMOTIDINE 40 MG PO TABS
40.0000 mg | ORAL_TABLET | Freq: Every day | ORAL | 2 refills | Status: DC
  Filled 2024-03-21: qty 30, 30d supply, fill #0
  Filled 2024-04-18: qty 30, 30d supply, fill #1
  Filled 2024-05-17: qty 30, 30d supply, fill #2

## 2024-03-21 NOTE — Progress Notes (Signed)
 Chief Complaint: Epigastric pain Primary GI MD: Dr. Adela Lank  HPI: 68 year old male history of CAD s/p PCI and CABG x 2 (2022), diabetes, hypertension, hyperlipidemia, presents for evaluation of epigastric pain  Last seen by cardiology February 2025.  He has history of heart catheterization 2024 not requiring PCI/intervention.  He was last seen by Dr. Adela Lank 10/2021.  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  He was recommended to take MiraLAX daily.  He was also having GERD and recommended increase PPI to twice daily  EGD/colonoscopy June/2022 with mild inflammation in gastric body (H. pylori negative) and 2 small polyps with a 7-year recall (2029)  ----------TODAY--------------------- Discussed the use of AI scribe software for clinical note transcription with the patient, who gave verbal consent to proceed.  History of Present Illness   Ronald Petersen "Amada Jupiter" is a 68 year old male who presents with worsening abdominal pain and bloating.   He experiences worsening abdominal pain and bloating, primarily in the upper abdomen, exacerbated by eating. The pain is severe and has progressively worsened over the past few months, making eating difficult. He feels bloated after meals, experiences early satiety, and notes that 'everything bloats me.'  He has a history of reflux, previously managed with pantoprazole, which was increased to twice daily in 2022. An endoscopy and colonoscopy in June 2022 were unremarkable. Despite the medication, he continues to experience upper abdominal pain and bloating, with intermittent, non-severe heartburn.  His bowel movements are irregular, described as 'good and then not good.' He uses Miralax as needed, but it causes stomach upset. He has not tried fiber supplements. He is aware that narcotic use  contributes to his constipation and tries to limit intake. He acknowledges the need to increase water intake.  No frequent or severe heartburn. He reports bloating and early satiety after eating. He has had his gallbladder removed.     PREVIOUS GI WORKUP   EGD 07/18/21: - The examined esophagus was normal. - Mild inflammation characterized by congestion (edema) and erythema was found in the gastric body, at the incisura and in the gastric antrum. Biopsies were taken with a cold forceps for Helicobacter pylori testing. Estimated blood loss was minimal. - The examined duodenum was normal. Biopsies were taken with a cold forceps for histology. Estimated blood loss was minimal.   Colonoscopy 07/18/21:The perianal and digital rectal examinations were normal. - Two sessile polyps were found in the sigmoid colon. The polyps were 2 to 4 mm in size. These polyps were removed with a cold snare. Resection and retrieval were complete. Estimated blood loss was minimal. - Small, non-bleeding internal hemorrhoids and hypertrophied anal papillae were found during retroflexion. - The exam was otherwise normal throughout the remainder of the colon. No areas of mucosal erythema, edema, ulceration, or erosions noted. - The terminal ileum appeared normal.   1. Surgical [P], random duodenal sites - BENIGN SMALL BOWEL MUCOSA. - NO VILLOUS BLUNTING OR INCREASE IN INTRAEPITHELIAL LYMPHOCYTES. - NO DYSPLASIA OR MALIGNANCY. 2. Surgical [P], gastric antrum and gastric body - REACTIVE GASTROPATHY. [place] IS NEGATIVE FOR HELICOBACTER PYLORI. - NO INTESTINAL METAPLASIA, DYSPLASIA, OR MALIGNANCY. 3. Surgical [P], colon, sigmoid, polyp (2) - TUBULAR ADENOMA. - HYPERPLASTIC POLYP. - NO HIGH GRADE DYSPLASIA OR MALIGNANCY.   Repeat colonoscopy in 7 years  2017 Colonoscopy for polyp surveillance --complete exam, adequate prep --Tortous colon. No polyps  otherwise, normal colon, nternal hemorrhoids,  hypertrophied anal papillae   04/25/21 CT scan w/ contrast IMPRESSION: 1. Mild diffuse bladder wall thickening may indicate cystitis. 2. No evidence of bowel obstruction or inflammation. 3. Mildly enlarged prostate gland. 4. Minimal periumbilical hernia containing fat.  Past Medical History:  Diagnosis Date   Allergy    Anxiety    Arthritis    CAD (coronary artery disease)    Cancer (HCC)    CHF (congestive heart failure) (HCC)    Complication of anesthesia    aspirated with back surgery at age 2   Depression    Diabetes mellitus Type II    GERD (gastroesophageal reflux disease)    Hyperlipidemia    Hypertension    Mild aortic stenosis    Mild carotid artery disease (HCC)    Neuromuscular disorder (HCC)    NEUROPATHY   Plavix resistance    abnormal cytochrome P450 2c19 genotype in 2021 inferring suboptimal Plavix platelet inhibition,   Right rotator cuff tear 11/23/2018   Sleep apnea    no CPAP    Past Surgical History:  Procedure Laterality Date   APPENDECTOMY     ARTHOSCOPIC ROTAOR CUFF REPAIR Right 11/23/2018   Procedure: ARTHROSCOPIC ROTATOR CUFF REPAIR;  Surgeon: Teryl Lucy, MD;  Location: [facility];  Service: Orthopedics;  Laterality: Right;   CARDIAC VALVE REPLACEMENT     CHOLECYSTECTOMY     CORONARY ARTERY BYPASS GRAFT N/A 12/24/2021   x2 LIMA to LAD; SVG to Obtuse Marginal   CORONARY STENT INTERVENTION N/A 11/19/2020   Procedure: CORONARY STENT INTERVENTION;  Surgeon: Yvonne Kendall, MD;  Location: [facility];  Service: Cardiovascular;  Laterality: N/A;   CORONARY ULTRASOUND/IVUS N/A 11/19/2020   Procedure: Intravascular Ultrasound/IVUS;  Surgeon: Yvonne Kendall, MD;  Location: [facility];  Service: Cardiovascular;  Laterality: N/A;   ENDOVEIN HARVEST OF GREATER SAPHENOUS VEIN Right 12/24/2021   Procedure: ENDOVEIN HARVEST OF GREATER SAPHENOUS VEIN;  Surgeon: Corliss Skains, MD;  Location: [facility] OR;  Service: Open  Heart Surgery;  Laterality: Right;   FRACTURE SURGERY     IR THORACENTESIS ASP PLEURAL SPACE W/IMG GUIDE  01/31/2022   LEFT HEART CATH AND CORONARY ANGIOGRAPHY N/A 11/19/2020   Procedure: LEFT HEART CATH AND CORONARY ANGIOGRAPHY;  Surgeon: Yvonne Kendall, MD;  Location: [facility];  Service: Cardiovascular;  Laterality: N/A;   LEFT HEART CATH AND CORONARY ANGIOGRAPHY N/A 12/24/2020   Procedure: LEFT HEART CATH AND CORONARY ANGIOGRAPHY;  Surgeon: [place], Peter M, MD;  Location: [facility] INVASIVE CV LAB;  Service: Cardiovascular;  Laterality: N/A;   LEFT HEART CATH AND CORONARY ANGIOGRAPHY N/A 12/16/2021   Procedure: LEFT HEART CATH AND CORONARY ANGIOGRAPHY;  Surgeon: Marykay Lex, MD;  Location: [facility] INVASIVE CV LAB;  Service: Cardiovascular;  Laterality: N/A;   LEFT HEART CATH AND CORS/GRAFTS ANGIOGRAPHY N/A 03/18/2023   Procedure: LEFT HEART CATH AND CORS/GRAFTS ANGIOGRAPHY;  Surgeon: Corky Crafts, MD;  Location: [facility] INVASIVE CV LAB;  Service: Cardiovascular;  Laterality: N/A;   LUMBAR LAMINECTOMY     SHOULDER ARTHROSCOPY WITH ROTATOR CUFF REPAIR AND SUBACROMIAL DECOMPRESSION Right 11/23/2018   Procedure: SHOULDER ARTHROSCOPY WITH ROTATOR CUFF REPAIR AND SUBACROMIAL DECOMPRESSION;  Surgeon: Teryl Lucy, MD;  Location: [facility];  Service: Orthopedics;  Laterality: Right;   SPINE SURGERY     TEE WITHOUT CARDIOVERSION N/A 12/24/2021   Procedure: TRANSESOPHAGEAL ECHOCARDIOGRAM (TEE);  Surgeon: Corliss Skains, MD;  Location: [facility] OR;  Service: Open  Heart Surgery;  Laterality: N/A;    Current Outpatient Medications  Medication Sig Dispense Refill   acetaminophen (TYLENOL) 650 MG CR tablet Take 650 mg by mouth every 8 (eight) hours as needed for pain.     aspirin EC 325 MG EC tablet Take 1 tablet (325 mg total) by mouth daily.     Bempedoic Acid-Ezetimibe (NEXLIZET) 180-10 MG TABS Take 1 tablet by mouth daily. (Replaces ezetimibe 10mg ) 90 tablet 1   ELDERBERRY  PO Take 300 mg by mouth daily.     empagliflozin (JARDIANCE) 25 MG TABS tablet Take 1 tablet (25 mg total) by mouth daily before breakfast. 90 tablet 3   famotidine (PEPCID) 40 MG tablet Take 1 tablet (40 mg total) by mouth at bedtime. 30 tablet 2   fenofibrate 160 MG tablet Take 1 tablet (160 mg total) by mouth daily. 90 tablet 1   gabapentin (NEURONTIN) 300 MG capsule Take 3 capsules (900 mg total) by mouth 3 (three) times daily. 270 capsule 11   glucose blood (ONETOUCH VERIO) test strip USE AS INSTRUCTED TO CHECK BLOOD SUGAR ONCE A DAY 100 strip 12   HYDROcodone-acetaminophen (NORCO/VICODIN) 5-325 MG tablet Take 1 tablet by mouth every 8 (eight) hours as needed. 45 tablet 0   insulin degludec (TRESIBA FLEXTOUCH) 100 UNIT/ML FlexTouch Pen Inject 10 Units into the skin daily. 10 u sq every day 3 mL 2   Insulin Pen Needle (TRUEPLUS 5-BEVEL PEN NEEDLES) 31G X 5 MM MISC Use to inject insulin 100 each 0   Magnesium Citrate 200 MG TABS Take 400 mg by mouth daily.     nitroGLYCERIN (NITROSTAT) 0.4 MG SL tablet Place 1 tablet (0.4 mg total) under the tongue every 5 (five) minutes as needed for chest pain. 90 tablet 3   ondansetron (ZOFRAN) 4 MG tablet Take 1 tablet (4 mg total) by mouth every 8 (eight) hours as needed. 20 tablet 2   pantoprazole (PROTONIX) 40 MG tablet Take 1 tablet (40 mg total) by mouth 2 (two) times daily. 180 tablet 1   ranolazine (RANEXA) 500 MG 12 hr tablet Take 1 tablet (500 mg total) by mouth 2 (two) times daily. 180 tablet 3   sertraline (ZOLOFT) 100 MG tablet Take 1.5 tablets (150 mg total) by mouth daily. 135 tablet 1   tiZANidine (ZANAFLEX) 4 MG tablet Take 1 tablet (4 mg total) by mouth every 6 (six) hours as needed for muscle spasms. 30 tablet 1   traZODone (DESYREL) 50 MG tablet Take 1 tablet (50 mg total) by mouth at bedtime as needed for sleep. 90 tablet 1   umeclidinium-vilanterol (ANORO ELLIPTA) 62.5-25 MCG/ACT AEPB Inhale 1 puff into the lungs daily. 60 each 1    vitamin E 180 MG (400 UNITS) capsule Take 1,600 Units by mouth daily.     predniSONE (DELTASONE) 20 MG tablet Take 1 tablet (20 mg total) by mouth daily with breakfast. (Patient not taking: Reported on 03/21/2024) 7 tablet 0   No current facility-administered medications for this visit.    Allergies as of 03/21/2024 - Review Complete 03/21/2024  Allergen Reaction Noted   Niacin Anaphylaxis 12/08/2006    Family History  Problem Relation Age of Onset   COPD Mother    Stroke Father    Ovarian cancer Sister    Stomach cancer Sister    Heart disease Sister        MI   Heart disease Sister        MI   Stomach cancer  Maternal Grandmother    Alcohol abuse Other    Depression Other    Arthritis Other    Hypertension Other    Coronary artery disease Other    Ovarian cancer Other        neice   Ovarian cancer Other        neice   Colon polyps Neg Hx    Colon cancer Neg Hx     Social History   Socioeconomic History   Marital status: Married    Spouse name: Erie Noe   Number of children: 2   Years of education: Not on file   Highest education level: GED or equivalent  Occupational History   Occupation: self employed    Associate Professor: UNEMPLOYED  Tobacco Use   Smoking status: Former    Current packs/day: 0.00    Types: Cigarettes    Start date: 12/29/1969    Quit date: 12/29/1985    Years since quitting: 38.2   Smokeless tobacco: Never  Vaping Use   Vaping status: Never Used  Substance and Sexual Activity   Alcohol use: Not Currently   Drug use: No   Sexual activity: Yes    Partners: Female  Other Topics Concern   Not on file  Social History Narrative   Exercise-- 3 days   Pt has hs degree   Right handed   One story home   Drinks caffeine 2 cups in am   Are you right handed or left handed?    Are you currently employed ?    What is your current occupation? retired   Do you live at home alone?   Who lives with you?  wife   What type of home do you live in: 1 story or 2  story?        Social Drivers of Health   Financial Resource Strain: High Risk (12/15/2023)   Overall Financial Resource Strain (CARDIA)    Difficulty of Paying Living Expenses: Hard  Food Insecurity: Food Insecurity Present (12/15/2023)   Hunger Vital Sign    Worried About Running Out of Food in the Last Year: Sometimes true    Ran Out of Food in the Last Year: Sometimes true  Transportation Needs: No Transportation Needs (12/15/2023)   PRAPARE - Administrator, Civil Service (Medical): No    Lack of Transportation (Non-Medical): No  Physical Activity: Insufficiently Active (12/15/2023)   Exercise Vital Sign    Days of Exercise per Week: 2 days    Minutes of Exercise per Session: 20 min  Stress: No Stress Concern Present (12/15/2023)   [employer] of Occupational Health - Occupational Stress Questionnaire    Feeling of Stress : Only a little  Social Connections: Unknown (12/15/2023)   Social Connection and Isolation Panel [NHANES]    Frequency of Communication with Friends and Family: Three times a week    Frequency of Social Gatherings with Friends and Family: Three times a week    Attends Religious Services: More than 4 times per year    Active Member of Clubs or Organizations: Patient declined    Attends Banker Meetings: Not on file    Marital Status: Married  Intimate Partner Violence: Not At Risk (03/17/2023)   Humiliation, Afraid, Rape, and Kick questionnaire    Fear of Current or Ex-Partner: No    Emotionally Abused: No    Physically Abused: No    Sexually Abused: No    Review of Systems:    Constitutional:  No weight loss, fever, chills, weakness or fatigue HEENT: Eyes: No change in vision               Ears, Nose, Throat:  No change in hearing or congestion Skin: No rash or itching Cardiovascular: No chest pain, chest pressure or palpitations   Respiratory: No SOB or cough Gastrointestinal: See HPI and otherwise  negative Genitourinary: No dysuria or change in urinary frequency Neurological: No headache, dizziness or syncope Musculoskeletal: No new muscle or joint pain Hematologic: No bleeding or bruising Psychiatric: No history of depression or anxiety    Physical Exam:  Vital signs: BP 108/72   Pulse (!) 50   Ht 5\' 11"  (1.803 m)   Wt 201 lb (91.2 kg)   SpO2 98%   BMI 28.03 kg/m   Constitutional: NAD, Well developed, Well nourished, alert and cooperative Head:  Normocephalic and atraumatic. Eyes:   PEERL, EOMI. No icterus. Conjunctiva pink. Respiratory: Respirations even and unlabored. Lungs clear to auscultation bilaterally.   No wheezes, crackles, or rhonchi.  Cardiovascular:  Regular rate and rhythm. No peripheral edema, cyanosis or pallor.  Gastrointestinal:  Soft, nondistended, nontender. No rebound or guarding. Normal bowel sounds. No appreciable masses or hepatomegaly. Rectal:  Not performed.  Msk:  Symmetrical without gross deformities. Without edema, no deformity or joint abnormality.  Neurologic:  Alert and  oriented x4;  grossly normal neurologically.  Skin:   Dry and intact without significant lesions or rashes. Psychiatric: Oriented to person, place and time. Demonstrates good judgement and reason without abnormal affect or behaviors. RELEVANT LABS AND IMAGING: CBC    Component Value Date/Time   WBC 7.2 01/19/2024 1752   RBC 6.16 (H) 01/19/2024 1752   HGB 17.4 (H) 01/19/2024 1752   HGB 14.7 05/14/2022 1236   HCT 51.9 (H) 01/19/2024 1752   HCT 44.9 05/14/2022 1236   PLT 181 01/19/2024 1752   PLT 174 05/14/2022 1236   MCV 84.3 01/19/2024 1752   MCV 80 05/14/2022 1236   MCH 28.2 01/19/2024 1752   MCHC 33.5 01/19/2024 1752   RDW 12.4 01/19/2024 1752   RDW 16.3 (H) 05/14/2022 1236   LYMPHSABS 1.9 12/15/2023 1755   LYMPHSABS 1.7 05/14/2022 1236   MONOABS 0.5 12/15/2023 1755   EOSABS 108 01/19/2024 1752   EOSABS 0.2 05/14/2022 1236   BASOSABS 29 01/19/2024 1752    BASOSABS 0.1 05/14/2022 1236    CMP     Component Value Date/Time   NA 137 01/19/2024 1752   NA 140 05/14/2022 1236   K 4.3 01/19/2024 1752   CL 99 01/19/2024 1752   CO2 28 01/19/2024 1752   GLUCOSE 128 (H) 01/19/2024 1752   GLUCOSE 85 12/10/2006 1603   BUN 36 (H) 01/19/2024 1752   BUN 30 (H) 05/14/2022 1236   CREATININE 0.91 01/19/2024 1752   CALCIUM 10.0 01/19/2024 1752   PROT 7.6 01/19/2024 1752   PROT 7.2 02/18/2022 0832   ALBUMIN 4.5 12/15/2023 1755   ALBUMIN 4.7 02/18/2022 0832   AST 17 01/19/2024 1752   ALT 17 01/19/2024 1752   ALKPHOS 94 12/15/2023 1755   BILITOT 0.8 01/19/2024 1752   BILITOT 0.4 02/18/2022 0832   GFRNONAA >60 03/17/2023 1451   GFRAA 82 01/17/2021 1157     Assessment/Plan:      Epigastric pain Early satiety GERD Chronic epigastric pain worse with eating with associated GERD and early satiety with negative EGD and no improvement on PPI twice daily.  History of narcotic use and diabetes.  DDx includes gastroparesis versus worsening GERD.  He is s/p cholecystectomy - Initiate famotidine 40 mg at bedtime. -Continue PPI twice daily - Order gastric emptying study to assess for gastroparesis. - Advise to eat small, frequent meals. - Educated patient on lifestyle modification (patient education handout - if gastric emptying study is negative, could consider SIBO testing. Suspect bloating may be multifactorial with his history of constipation as well.  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 Miralax causes stomach upset.  - Provide samples of Linzess . - Advise to increase water intake. - Discuss potential side effect of diarrhea with Linzess.     Lara Mulch [facility] 03/21/2024, 11:46 AM  Cc: [name], [place] Congress, *

## 2024-03-21 NOTE — Progress Notes (Signed)
 Agree with assessment and plan as outlined.

## 2024-03-21 NOTE — Patient Instructions (Addendum)
 We have given you samples of the following medication to take: Linzess 72 mcg once daily  _______________________________________________________  If your blood pressure at your visit was 140/90 or greater, please contact your primary care physician to follow up on this.  _______________________________________________________  If you are age 69 or older, your body mass index should be between 23-30. Your Body mass index is 28.03 kg/m. If this is out of the aforementioned range listed, please consider follow up with your Primary Care Provider.  If you are age 70 or younger, your body mass index should be between 19-25. Your Body mass index is 28.03 kg/m. If this is out of the aformentioned range listed, please consider follow up with your Primary Care Provider.   ________________________________________________________  The [facility] providers would like to encourage you to use [facility] to communicate with providers for non-urgent requests or questions.  Due to long hold times on the telephone, sending your provider a message by [facility] may be a faster and more efficient way to get a response.  Please allow 48 business hours for a response.  Please remember that this is for non-urgent requests.  _______________________________________________________  Increase your Famotidine to 40 mg at bedtime.  You have been scheduled for a gastric emptying scan at [facility] on Tuesday 04/05/24 at 8:00 am. Please arrive at least 30 minutes prior to your appointment for registration. Please make certain not to have anything to eat or drink after midnight the night before your test. Hold all stomach medications (ex: Zofran, phenergan, Reglan) 24 hours prior to your test. If you need to reschedule your appointment, please contact radiology scheduling at [phone]. _____________________________________________________________________ A gastric-emptying study measures how long it takes for food to  move through your stomach. There are several ways to measure stomach emptying. In the most common test, you eat food that contains a small amount of radioactive material. A scanner that detects the movement of the radioactive material is placed over your abdomen to monitor the rate at which food leaves your stomach. This test normally takes about 4 hours to complete. _____________________________________________________________________   Thank you for choosing me and [facility].

## 2024-03-22 ENCOUNTER — Telehealth: Payer: Self-pay | Admitting: Family Medicine

## 2024-03-22 NOTE — Telephone Encounter (Signed)
 Copied from CRM [id number]. Topic: Medicare AWV >> Mar 22, 2024 12:58 PM Payton Doughty wrote: Reason for CRM: Called LVM 03/22/2024 to schedule AWV. Please schedule Virtual or Telehealth visits ONLY.   Verlee Rossetti; Care Guide Ambulatory Clinical Support [place] [facility] Direct Dial: [phone]

## 2024-03-23 ENCOUNTER — Other Ambulatory Visit (HOSPITAL_BASED_OUTPATIENT_CLINIC_OR_DEPARTMENT_OTHER): Payer: Self-pay

## 2024-03-23 ENCOUNTER — Other Ambulatory Visit: Payer: Self-pay | Admitting: Family Medicine

## 2024-03-23 ENCOUNTER — Encounter: Payer: Self-pay | Admitting: Pulmonary Disease

## 2024-03-23 ENCOUNTER — Ambulatory Visit (INDEPENDENT_AMBULATORY_CARE_PROVIDER_SITE_OTHER): Payer: HMO | Admitting: Pulmonary Disease

## 2024-03-23 VITALS — BP 124/77 | HR 56 | Temp 97.5°F | Ht 71.0 in | Wt 202.0 lb

## 2024-03-23 DIAGNOSIS — R0602 Shortness of breath: Secondary | ICD-10-CM | POA: Diagnosis not present

## 2024-03-23 DIAGNOSIS — F32A Depression, unspecified: Secondary | ICD-10-CM

## 2024-03-23 MED ORDER — SERTRALINE HCL 100 MG PO TABS
150.0000 mg | ORAL_TABLET | Freq: Every day | ORAL | 1 refills | Status: DC
  Filled 2024-03-25: qty 135, 90d supply, fill #0
  Filled 2024-06-23: qty 135, 90d supply, fill #1

## 2024-03-23 NOTE — Patient Instructions (Signed)
 The ultrasound of your heart does look fine  We need to follow-up with a CT scan about February 2026 for the spot on the lung  Call us with significant concerns  I will see you about a year from now

## 2024-03-23 NOTE — Progress Notes (Signed)
 Ronald Petersen    [id number]    Apr 25, 1956  Primary Care Physician:[name], DO  Referring Physician: [name], Grayling Congress, DO [address] [place],  [place] [address]  Chief complaint:   Patient being seen for abnormal CT Pleural thickening, lung nodule  HPI:  He was seen for an acute visit about 5 6 weeks ago  Symptoms are significantly improved No longer coughing, shortness of breath is improved  Activity level continues to improve  Denies any other ongoing symptoms at present Functioning relatively well  He does have a history of heart surgery, history of chronic pleural effusion, coronary artery disease, unstable angina  Follows up regularly with his primary care doctor  History of diabetes, GERD, hyperlipidemia History of obstructive sleep apnea-not on CPAP therapy CABG times 29 November 2021  Quit smoking in 1987  Previous PFT with mixed obstruction and restriction  Outpatient Encounter Medications as of 03/23/2024  Medication Sig   acetaminophen (TYLENOL) 650 MG CR tablet Take 650 mg by mouth every 8 (eight) hours as needed for pain.   aspirin EC 325 MG EC tablet Take 1 tablet (325 mg total) by mouth daily.   Bempedoic Acid-Ezetimibe (NEXLIZET) 180-10 MG TABS Take 1 tablet by mouth daily. (Replaces ezetimibe 10mg )   ELDERBERRY PO Take 300 mg by mouth daily.   empagliflozin (JARDIANCE) 25 MG TABS tablet Take 1 tablet (25 mg total) by mouth daily before breakfast.   famotidine (PEPCID) 40 MG tablet Take 1 tablet (40 mg total) by mouth at bedtime.   fenofibrate 160 MG tablet Take 1 tablet (160 mg total) by mouth daily.   gabapentin (NEURONTIN) 300 MG capsule Take 3 capsules (900 mg total) by mouth 3 (three) times daily.   glucose blood (ONETOUCH VERIO) test strip USE AS INSTRUCTED TO CHECK BLOOD SUGAR ONCE A DAY   HYDROcodone-acetaminophen (NORCO/VICODIN) 5-325 MG tablet Take 1 tablet by mouth every 8 (eight) hours as  needed.   insulin degludec (TRESIBA FLEXTOUCH) 100 UNIT/ML FlexTouch Pen Inject 10 Units into the skin daily. 10 u sq every day   Insulin Pen Needle (TRUEPLUS 5-BEVEL PEN NEEDLES) 31G X 5 MM MISC Use to inject insulin   Magnesium Citrate 200 MG TABS Take 400 mg by mouth daily.   nitroGLYCERIN (NITROSTAT) 0.4 MG SL tablet Place 1 tablet (0.4 mg total) under the tongue every 5 (five) minutes as needed for chest pain.   ondansetron (ZOFRAN) 4 MG tablet Take 1 tablet (4 mg total) by mouth every 8 (eight) hours as needed.   pantoprazole (PROTONIX) 40 MG tablet Take 1 tablet (40 mg total) by mouth 2 (two) times daily.   predniSONE (DELTASONE) 20 MG tablet Take 1 tablet (20 mg total) by mouth daily with breakfast.   ranolazine (RANEXA) 500 MG 12 hr tablet Take 1 tablet (500 mg total) by mouth 2 (two) times daily.   sertraline (ZOLOFT) 100 MG tablet Take 1.5 tablets (150 mg total) by mouth daily.   tiZANidine (ZANAFLEX) 4 MG tablet Take 1 tablet (4 mg total) by mouth every 6 (six) hours as needed for muscle spasms.   traZODone (DESYREL) 50 MG tablet Take 1 tablet (50 mg total) by mouth at bedtime as needed for sleep.   umeclidinium-vilanterol (ANORO ELLIPTA) 62.5-25 MCG/ACT AEPB Inhale 1 puff into the lungs daily.   vitamin E 180 MG (400 UNITS) capsule Take 1,600 Units by mouth daily.   No facility-administered  encounter medications on file as of 03/23/2024.    Allergies as of 03/23/2024 - Review Complete 03/23/2024  Allergen Reaction Noted   Niacin Anaphylaxis 12/08/2006    Past Medical History:  Diagnosis Date   Allergy    Anxiety    Arthritis    CAD (coronary artery disease)    Cancer (HCC)    CHF (congestive heart failure) (HCC)    Complication of anesthesia    aspirated with back surgery at age 32   Depression    Diabetes mellitus Type II    GERD (gastroesophageal reflux disease)    Hyperlipidemia    Hypertension    Mild aortic stenosis    Mild carotid artery disease (HCC)     Neuromuscular disorder (HCC)    NEUROPATHY   Plavix resistance    abnormal cytochrome P450 2c19 genotype in 2021 inferring suboptimal Plavix platelet inhibition,   Right rotator cuff tear 11/23/2018   Sleep apnea    no CPAP    Past Surgical History:  Procedure Laterality Date   APPENDECTOMY     ARTHOSCOPIC ROTAOR CUFF REPAIR Right 11/23/2018   Procedure: ARTHROSCOPIC ROTATOR CUFF REPAIR;  Surgeon: Teryl Lucy, MD;  Location: [facility];  Service: Orthopedics;  Laterality: Right;   CARDIAC VALVE REPLACEMENT     CHOLECYSTECTOMY     CORONARY ARTERY BYPASS GRAFT N/A 12/24/2021   x2 LIMA to LAD; SVG to Obtuse Marginal   CORONARY STENT INTERVENTION N/A 11/19/2020   Procedure: CORONARY STENT INTERVENTION;  Surgeon: Yvonne Kendall, MD;  Location: [facility];  Service: Cardiovascular;  Laterality: N/A;   CORONARY ULTRASOUND/IVUS N/A 11/19/2020   Procedure: Intravascular Ultrasound/IVUS;  Surgeon: Yvonne Kendall, MD;  Location: [facility];  Service: Cardiovascular;  Laterality: N/A;   ENDOVEIN HARVEST OF GREATER SAPHENOUS VEIN Right 12/24/2021   Procedure: ENDOVEIN HARVEST OF GREATER SAPHENOUS VEIN;  Surgeon: Corliss Skains, MD;  Location: [facility] OR;  Service: Open Heart Surgery;  Laterality: Right;   FRACTURE SURGERY     IR THORACENTESIS ASP PLEURAL SPACE W/IMG GUIDE  01/31/2022   LEFT HEART CATH AND CORONARY ANGIOGRAPHY N/A 11/19/2020   Procedure: LEFT HEART CATH AND CORONARY ANGIOGRAPHY;  Surgeon: Yvonne Kendall, MD;  Location: [facility];  Service: Cardiovascular;  Laterality: N/A;   LEFT HEART CATH AND CORONARY ANGIOGRAPHY N/A 12/24/2020   Procedure: LEFT HEART CATH AND CORONARY ANGIOGRAPHY;  Surgeon: Swaziland, Peter M, MD;  Location: [facility] INVASIVE CV LAB;  Service: Cardiovascular;  Laterality: N/A;   LEFT HEART CATH AND CORONARY ANGIOGRAPHY N/A 12/16/2021   Procedure: LEFT HEART CATH AND CORONARY ANGIOGRAPHY;  Surgeon: Marykay Lex, MD;   Location: [facility] INVASIVE CV LAB;  Service: Cardiovascular;  Laterality: N/A;   LEFT HEART CATH AND CORS/GRAFTS ANGIOGRAPHY N/A 03/18/2023   Procedure: LEFT HEART CATH AND CORS/GRAFTS ANGIOGRAPHY;  Surgeon: Corky Crafts, MD;  Location: [facility] INVASIVE CV LAB;  Service: Cardiovascular;  Laterality: N/A;   LUMBAR LAMINECTOMY     SHOULDER ARTHROSCOPY WITH ROTATOR CUFF REPAIR AND SUBACROMIAL DECOMPRESSION Right 11/23/2018   Procedure: SHOULDER ARTHROSCOPY WITH ROTATOR CUFF REPAIR AND SUBACROMIAL DECOMPRESSION;  Surgeon: Teryl Lucy, MD;  Location: [facility];  Service: Orthopedics;  Laterality: Right;   SPINE SURGERY     TEE WITHOUT CARDIOVERSION N/A 12/24/2021   Procedure: TRANSESOPHAGEAL ECHOCARDIOGRAM (TEE);  Surgeon: Corliss Skains, MD;  Location: [facility] OR;  Service: Open Heart Surgery;  Laterality: N/A;    Family History  Problem Relation Age of  Onset   COPD Mother    Stroke Father    Ovarian cancer Sister    Stomach cancer Sister    Heart disease Sister        MI   Heart disease Sister        MI   Stomach cancer Maternal Grandmother    Alcohol abuse Other    Depression Other    Arthritis Other    Hypertension Other    Coronary artery disease Other    Ovarian cancer Other        neice   Ovarian cancer Other        neice   Colon polyps Neg Hx    Colon cancer Neg Hx     Social History   Socioeconomic History   Marital status: Married    Spouse name: Erie Noe   Number of children: 2   Years of education: Not on file   Highest education level: GED or equivalent  Occupational History   Occupation: self employed    Associate Professor: UNEMPLOYED  Tobacco Use   Smoking status: Former    Current packs/day: 0.00    Types: Cigarettes    Start date: 12/29/1969    Quit date: 12/29/1985    Years since quitting: 38.2   Smokeless tobacco: Never  Vaping Use   Vaping status: Never Used  Substance and Sexual Activity   Alcohol use: Not Currently   Drug use: No   Sexual  activity: Yes    Partners: Female  Other Topics Concern   Not on file  Social History Narrative   Exercise-- 3 days   Pt has hs degree   Right handed   One story home   Drinks caffeine 2 cups in am   Are you right handed or left handed?    Are you currently employed ?    What is your current occupation? retired   Do you live at home alone?   Who lives with you?  wife   What type of home do you live in: 1 story or 2 story?        Social Drivers of Health   Financial Resource Strain: High Risk (12/15/2023)   Overall Financial Resource Strain (CARDIA)    Difficulty of Paying Living Expenses: Hard  Food Insecurity: Food Insecurity Present (12/15/2023)   Hunger Vital Sign    Worried About Running Out of Food in the Last Year: Sometimes true    Ran Out of Food in the Last Year: Sometimes true  Transportation Needs: No Transportation Needs (12/15/2023)   PRAPARE - Administrator, Civil Service (Medical): No    Lack of Transportation (Non-Medical): No  Physical Activity: Insufficiently Active (12/15/2023)   Exercise Vital Sign    Days of Exercise per Week: 2 days    Minutes of Exercise per Session: 20 min  Stress: No Stress Concern Present (12/15/2023)   [employer] of Occupational Health - Occupational Stress Questionnaire    Feeling of Stress : Only a little  Social Connections: Unknown (12/15/2023)   Social Connection and Isolation Panel [NHANES]    Frequency of Communication with Friends and Family: Three times a week    Frequency of Social Gatherings with Friends and Family: Three times a week    Attends Religious Services: More than 4 times per year    Active Member of Clubs or Organizations: Patient declined    Attends Banker Meetings: Not on file    Marital Status: Married  Intimate Partner Violence: Not At Risk (03/17/2023)   Humiliation, Afraid, Rape, and Kick questionnaire    Fear of Current or Ex-Partner: No    Emotionally Abused: No     Physically Abused: No    Sexually Abused: No    Review of Systems  Constitutional:  Negative for fever.  Respiratory:  Negative for cough and shortness of breath.   Psychiatric/Behavioral:  Negative for sleep disturbance.     Vitals:   03/23/24 1104  BP: 124/77  Pulse: (!) 56  Temp: (!) 97.5 F (36.4 C)  SpO2: 98%     Physical Exam Constitutional:      Appearance: Normal appearance.  HENT:     Head: Normocephalic.     Mouth/Throat:     Mouth: Mucous membranes are moist.  Eyes:     General: No scleral icterus. Cardiovascular:     Rate and Rhythm: Normal rate and regular rhythm.     Heart sounds: No murmur heard.    No friction rub.  Pulmonary:     Effort: No respiratory distress.     Breath sounds: No stridor. No wheezing or rhonchi.  Musculoskeletal:     Cervical back: No rigidity.  Neurological:     Mental Status: He is alert.  Psychiatric:        Mood and Affect: Mood normal.    Data Reviewed: CT scan 01/29/2024 reviewed showing left lower lobe nodule Pleural thickening  Most recent echocardiogram with normal ejection fraction, no mention of pulmonary hypertension, previous echo from 2022 and showed mild pulmonary hypertension  Assessment:  Shortness of breath on exertion -This has significantly improved -No longer coughing -Continue to monitor symptoms  Mild pulmonary hypertension -Most recent echo does not suggest presence of significant increase in pulmonary pressures  History of coronary artery disease -Stable  Lung nodule, pleural thickening, abnormal CT scan of the chest   Plan/Recommendations: Follow-up CT in a year from now  Continue graded activities as tolerated  Encouraged to give [place] a call with significant concerns  Will see him back in about a year   Virl Diamond MD [facility] 03/23/2024, 11:09 AM  CC: Donato Schultz, *

## 2024-03-25 ENCOUNTER — Other Ambulatory Visit: Payer: Self-pay

## 2024-03-25 ENCOUNTER — Other Ambulatory Visit: Payer: Self-pay | Admitting: Family Medicine

## 2024-03-25 ENCOUNTER — Other Ambulatory Visit (HOSPITAL_BASED_OUTPATIENT_CLINIC_OR_DEPARTMENT_OTHER): Payer: Self-pay

## 2024-03-25 DIAGNOSIS — M4802 Spinal stenosis, cervical region: Secondary | ICD-10-CM

## 2024-03-25 DIAGNOSIS — E1169 Type 2 diabetes mellitus with other specified complication: Secondary | ICD-10-CM

## 2024-03-25 MED ORDER — FENOFIBRATE 160 MG PO TABS
160.0000 mg | ORAL_TABLET | Freq: Every day | ORAL | 0 refills | Status: DC
  Filled 2024-03-25: qty 90, 90d supply, fill #0

## 2024-03-25 NOTE — Telephone Encounter (Signed)
 Requesting: hydrocodone 5-325mg  Contract:06/05/23 UDS:06/05/23 Last Visit: 01/19/24 Next Visit: None Last Refill: 03/02/24 #45 and 0RF   Please Advise

## 2024-03-27 MED ORDER — HYDROCODONE-ACETAMINOPHEN 5-325 MG PO TABS
1.0000 | ORAL_TABLET | Freq: Three times a day (TID) | ORAL | 0 refills | Status: DC | PRN
  Filled 2024-03-27: qty 45, 15d supply, fill #0

## 2024-03-28 ENCOUNTER — Other Ambulatory Visit (HOSPITAL_BASED_OUTPATIENT_CLINIC_OR_DEPARTMENT_OTHER): Payer: Self-pay

## 2024-03-31 ENCOUNTER — Ambulatory Visit: Payer: PPO | Attending: Internal Medicine | Admitting: Pharmacist

## 2024-03-31 VITALS — BP 106/67 | HR 54 | Ht 71.0 in | Wt 197.8 lb

## 2024-03-31 DIAGNOSIS — E11649 Type 2 diabetes mellitus with hypoglycemia without coma: Secondary | ICD-10-CM

## 2024-03-31 DIAGNOSIS — I2510 Atherosclerotic heart disease of native coronary artery without angina pectoris: Secondary | ICD-10-CM | POA: Diagnosis not present

## 2024-03-31 DIAGNOSIS — G4733 Obstructive sleep apnea (adult) (pediatric): Secondary | ICD-10-CM | POA: Diagnosis not present

## 2024-03-31 NOTE — Progress Notes (Signed)
 Patient ID: Ronald Petersen                 DOB: 1956/11/14                    MRN: [id number]     HPI: [name] is a 68 y.o. male patient referred to pharmacy clinic by Dr Servando Salina to initiate GLP1-RA therapy. PMH is significant for HTN, CAD, OSA, snoring, T2DM, HLD, and obesity. Most recent BMI 27.6.  Patient presents today in good spirits. Feels well. Denies chest pain, SOB, or swelling. Has been more active recently now that he has been feeling better.  He and his wife do not eat out, believes once a month or less. Meals at home consist of vegetables and protein.  His daughter, son in law, and their 2 dogs will be moving in with him shortly.  Currently on [place] for DM. Last A1c 7.4%   Labs: Lab Results  Component Value Date   HGBA1C 7.4 (H) 01/19/2024    Wt Readings from Last 1 Encounters:  03/23/24 202 lb (91.6 kg)    BP Readings from Last 1 Encounters:  03/23/24 124/77   Pulse Readings from Last 1 Encounters:  03/23/24 (!) 56       Component Value Date/Time   CHOL 222 (H) 01/19/2024 1752   TRIG 179 (H) 01/19/2024 1752   TRIG 148 12/10/2006 1603   HDL 43 01/19/2024 1752   CHOLHDL 5.2 (H) 01/19/2024 1752   VLDL 23.2 06/05/2023 1011   LDLCALC 148 (H) 01/19/2024 1752   LDLDIRECT 93.0 05/31/2020 0910    Past Medical History:  Diagnosis Date   Allergy    Anxiety    Arthritis    CAD (coronary artery disease)    Cancer (HCC)    CHF (congestive heart failure) (HCC)    Complication of anesthesia    aspirated with back surgery at age 61   Depression    Diabetes mellitus Type II    GERD (gastroesophageal reflux disease)    Hyperlipidemia    Hypertension    Mild aortic stenosis    Mild carotid artery disease (HCC)    Neuromuscular disorder (HCC)    NEUROPATHY   Plavix resistance    abnormal cytochrome P450 2c19 genotype in 2021 inferring suboptimal Plavix platelet inhibition,   Right rotator cuff tear 11/23/2018   Sleep apnea    no CPAP     Current Outpatient Medications on File Prior to Visit  Medication Sig Dispense Refill   acetaminophen (TYLENOL) 650 MG CR tablet Take 650 mg by mouth every 8 (eight) hours as needed for pain.     aspirin EC 325 MG EC tablet Take 1 tablet (325 mg total) by mouth daily.     Bempedoic Acid-Ezetimibe (NEXLIZET) 180-10 MG TABS Take 1 tablet by mouth daily. (Replaces ezetimibe 10mg ) 90 tablet 1   ELDERBERRY PO Take 300 mg by mouth daily.     empagliflozin (JARDIANCE) 25 MG TABS tablet Take 1 tablet (25 mg total) by mouth daily before breakfast. 90 tablet 3   famotidine (PEPCID) 40 MG tablet Take 1 tablet (40 mg total) by mouth at bedtime. 30 tablet 2   fenofibrate 160 MG tablet Take 1 tablet (160 mg total) by mouth daily. 90 tablet 0   gabapentin (NEURONTIN) 300 MG capsule Take 3 capsules (900 mg total) by mouth 3 (three) times daily. 270 capsule 11   glucose blood (ONETOUCH VERIO) test strip USE  AS INSTRUCTED TO CHECK BLOOD SUGAR ONCE A DAY 100 strip 12   HYDROcodone-acetaminophen (NORCO/VICODIN) 5-325 MG tablet Take 1 tablet by mouth every 8 (eight) hours as needed. 45 tablet 0   insulin degludec (TRESIBA FLEXTOUCH) 100 UNIT/ML FlexTouch Pen Inject 10 Units into the skin daily. 10 u sq every day 3 mL 2   Insulin Pen Needle (TRUEPLUS 5-BEVEL PEN NEEDLES) 31G X 5 MM MISC Use to inject insulin 100 each 0   Magnesium Citrate 200 MG TABS Take 400 mg by mouth daily.     nitroGLYCERIN (NITROSTAT) 0.4 MG SL tablet Place 1 tablet (0.4 mg total) under the tongue every 5 (five) minutes as needed for chest pain. 90 tablet 3   ondansetron (ZOFRAN) 4 MG tablet Take 1 tablet (4 mg total) by mouth every 8 (eight) hours as needed. 20 tablet 2   pantoprazole (PROTONIX) 40 MG tablet Take 1 tablet (40 mg total) by mouth 2 (two) times daily. 180 tablet 1   predniSONE (DELTASONE) 20 MG tablet Take 1 tablet (20 mg total) by mouth daily with breakfast. 7 tablet 0   ranolazine (RANEXA) 500 MG 12 hr tablet Take 1 tablet  (500 mg total) by mouth 2 (two) times daily. 180 tablet 3   sertraline (ZOLOFT) 100 MG tablet Take 1.5 tablets (150 mg total) by mouth daily. 135 tablet 1   tiZANidine (ZANAFLEX) 4 MG tablet Take 1 tablet (4 mg total) by mouth every 6 (six) hours as needed for muscle spasms. 30 tablet 1   traZODone (DESYREL) 50 MG tablet Take 1 tablet (50 mg total) by mouth at bedtime as needed for sleep. 90 tablet 1   umeclidinium-vilanterol (ANORO ELLIPTA) 62.5-25 MCG/ACT AEPB Inhale 1 puff into the lungs daily. 60 each 1   vitamin E 180 MG (400 UNITS) capsule Take 1,600 Units by mouth daily.     No current facility-administered medications on file prior to visit.    Allergies  Allergen Reactions   Niacin Anaphylaxis    Flushing - required ED visit     Assessment/Plan:  1. Weight loss/DM/OSA - Patient's BMI today 27.6 kg/m2 placing him in overweight category. Recommend GLP to help with weight loss and comorbidities such as a obstructive sleep apnea, T2DM, and CAD.  Using tirzepatide demo pens, educated patient on mechanism of action, storage, site selection, administration, and possible adverse effects. Will complete PA and contact patient with result. Confirmed patient has no personal or family history of medullary thyroid carcinoma (MTC) or Multiple Endocrine Neoplasia syndrome type 2 (MEN 2). Injection technique reviewed at today's visit.  Advised patient on common side effects including nausea, diarrhea, dyspepsia, decreased appetite, and fatigue. Counseled patient on reducing meal size and how to titrate medication to minimize side effects. Counseled patient to call if intolerable side effects or if experiencing dehydration, abdominal pain, or dizziness. Patient will adhere to dietary modifications and will target at least 150 minutes of moderate intensity exercise weekly.   Follow up in 1 month via telephone for tolerability update and dose titration.   Laural Golden, PharmD, BCACP, CDCES, CPP [address] [place], [place], [address] Phone: [phone], Fax: [phone]

## 2024-03-31 NOTE — Patient Instructions (Addendum)
 It was nice meeting you today  The medications we discussed today are called Mounjaro and Zepbound.. They both contain the same active medication  I will complete the prior authorizations and let you know which one is approved  Once you start the medication, send me a myChart message when you are down to one pen and I can send in the next strength for you  Please let us know if you have any questions  Laural Golden, PharmD, BCACP, CDCES, CPP [address] [place], [place], [address] Phone: [phone], Fax: [phone]

## 2024-04-01 ENCOUNTER — Telehealth: Payer: Self-pay | Admitting: Pharmacy Technician

## 2024-04-01 ENCOUNTER — Telehealth: Payer: Self-pay | Admitting: Pharmacist

## 2024-04-01 ENCOUNTER — Encounter: Payer: Self-pay | Admitting: Pharmacist

## 2024-04-01 ENCOUNTER — Other Ambulatory Visit (HOSPITAL_COMMUNITY): Payer: Self-pay

## 2024-04-01 ENCOUNTER — Other Ambulatory Visit (HOSPITAL_BASED_OUTPATIENT_CLINIC_OR_DEPARTMENT_OTHER): Payer: Self-pay

## 2024-04-01 DIAGNOSIS — E669 Obesity, unspecified: Secondary | ICD-10-CM

## 2024-04-01 DIAGNOSIS — G4733 Obstructive sleep apnea (adult) (pediatric): Secondary | ICD-10-CM

## 2024-04-01 DIAGNOSIS — R0683 Snoring: Secondary | ICD-10-CM

## 2024-04-01 DIAGNOSIS — E11649 Type 2 diabetes mellitus with hypoglycemia without coma: Secondary | ICD-10-CM

## 2024-04-01 MED ORDER — MOUNJARO 2.5 MG/0.5ML ~~LOC~~ SOAJ
2.5000 mg | SUBCUTANEOUS | 0 refills | Status: DC
Start: 2024-04-01 — End: 2024-05-04
  Filled 2024-04-01: qty 2, 28d supply, fill #0

## 2024-04-01 NOTE — Telephone Encounter (Signed)
 Pharmacy Patient Advocate Encounter  Received notification from [facility] ADVANTAGE/RX ADVANCE that Prior Authorization for mounjaro has been APPROVED from 04/01/24 to 04/01/25. Ran test claim, Copay is $0.00. This test claim was processed through [facility]- copay amounts may vary at other pharmacies due to pharmacy/plan contracts, or as the patient moves through the different stages of their insurance plan.

## 2024-04-01 NOTE — Telephone Encounter (Signed)
 Please complete PA for Mounjaro (DM diagnosis) or Zepbound (OSA diagnosis)

## 2024-04-05 ENCOUNTER — Ambulatory Visit (HOSPITAL_COMMUNITY)

## 2024-04-11 ENCOUNTER — Other Ambulatory Visit: Payer: Self-pay | Admitting: Pulmonary Disease

## 2024-04-12 ENCOUNTER — Other Ambulatory Visit (HOSPITAL_BASED_OUTPATIENT_CLINIC_OR_DEPARTMENT_OTHER): Payer: Self-pay

## 2024-04-12 MED ORDER — ANORO ELLIPTA 62.5-25 MCG/ACT IN AEPB
1.0000 | INHALATION_SPRAY | Freq: Every day | RESPIRATORY_TRACT | 1 refills | Status: DC
  Filled 2024-04-12: qty 60, 60d supply, fill #0
  Filled 2024-06-06: qty 60, 60d supply, fill #1

## 2024-04-12 NOTE — Telephone Encounter (Signed)
 AO, please advise if okay to refill Anoro?

## 2024-04-13 ENCOUNTER — Other Ambulatory Visit: Payer: Self-pay | Admitting: Family Medicine

## 2024-04-13 ENCOUNTER — Other Ambulatory Visit (HOSPITAL_BASED_OUTPATIENT_CLINIC_OR_DEPARTMENT_OTHER): Payer: Self-pay

## 2024-04-13 DIAGNOSIS — G8929 Other chronic pain: Secondary | ICD-10-CM

## 2024-04-13 MED ORDER — TIZANIDINE HCL 4 MG PO TABS
4.0000 mg | ORAL_TABLET | Freq: Four times a day (QID) | ORAL | 2 refills | Status: DC | PRN
  Filled 2024-04-13: qty 30, 8d supply, fill #0
  Filled 2024-04-26: qty 30, 8d supply, fill #1
  Filled 2024-05-15: qty 30, 8d supply, fill #2

## 2024-04-18 ENCOUNTER — Other Ambulatory Visit (HOSPITAL_BASED_OUTPATIENT_CLINIC_OR_DEPARTMENT_OTHER): Payer: Self-pay

## 2024-04-18 ENCOUNTER — Other Ambulatory Visit: Payer: Self-pay

## 2024-04-18 ENCOUNTER — Other Ambulatory Visit: Payer: Self-pay | Admitting: Family Medicine

## 2024-04-18 DIAGNOSIS — R110 Nausea: Secondary | ICD-10-CM

## 2024-04-18 MED ORDER — PANTOPRAZOLE SODIUM 40 MG PO TBEC
40.0000 mg | DELAYED_RELEASE_TABLET | Freq: Two times a day (BID) | ORAL | 1 refills | Status: DC
  Filled 2024-04-18: qty 180, 90d supply, fill #0
  Filled 2024-07-15: qty 180, 90d supply, fill #1

## 2024-04-26 ENCOUNTER — Other Ambulatory Visit (HOSPITAL_BASED_OUTPATIENT_CLINIC_OR_DEPARTMENT_OTHER): Payer: Self-pay

## 2024-04-26 ENCOUNTER — Other Ambulatory Visit: Payer: Self-pay | Admitting: Family Medicine

## 2024-04-26 ENCOUNTER — Other Ambulatory Visit: Payer: Self-pay | Admitting: Family

## 2024-04-26 DIAGNOSIS — E119 Type 2 diabetes mellitus without complications: Secondary | ICD-10-CM

## 2024-04-26 DIAGNOSIS — M4802 Spinal stenosis, cervical region: Secondary | ICD-10-CM

## 2024-04-26 MED ORDER — ONETOUCH VERIO VI STRP
ORAL_STRIP | 12 refills | Status: AC
  Filled 2024-04-29: qty 100, 100d supply, fill #0
  Filled 2024-08-11: qty 100, 100d supply, fill #1
  Filled 2024-11-22: qty 100, 100d supply, fill #2

## 2024-04-27 ENCOUNTER — Other Ambulatory Visit (HOSPITAL_COMMUNITY): Payer: Self-pay

## 2024-04-27 ENCOUNTER — Other Ambulatory Visit (HOSPITAL_BASED_OUTPATIENT_CLINIC_OR_DEPARTMENT_OTHER): Payer: Self-pay

## 2024-04-27 ENCOUNTER — Telehealth: Payer: Self-pay | Admitting: Pharmacy Technician

## 2024-04-27 MED ORDER — TRESIBA FLEXTOUCH 100 UNIT/ML ~~LOC~~ SOPN
10.0000 [IU] | PEN_INJECTOR | Freq: Every day | SUBCUTANEOUS | 2 refills | Status: DC
  Filled 2024-04-27: qty 3, 30d supply, fill #0
  Filled 2024-05-23: qty 3, 30d supply, fill #1
  Filled 2024-06-20: qty 3, 30d supply, fill #2

## 2024-04-27 NOTE — Telephone Encounter (Signed)
 Pharmacy Patient Advocate Encounter   Received notification from CoverMyMeds that prior authorization for Nexlizet  180-10MG  tablets is required/requested.   Insurance verification completed.   The patient is insured through [facility] ADVANTAGE/RX ADVANCE .   Per test claim: PA required; PA submitted to above mentioned insurance via CoverMyMeds Key/confirmation #/EOC [id number] Status is pending

## 2024-04-28 ENCOUNTER — Telehealth: Payer: Self-pay

## 2024-04-28 ENCOUNTER — Other Ambulatory Visit (HOSPITAL_BASED_OUTPATIENT_CLINIC_OR_DEPARTMENT_OTHER): Payer: Self-pay

## 2024-04-28 NOTE — Telephone Encounter (Signed)
 Copied from CRM [id number]. Topic: Referral - Prior Authorization Question >> Apr 28, 2024  2:26 PM Earnestine Goes B wrote: Reason for CRM: health team advantage called to request clinical details for prior auth for nexlivet. Please fax clinicals to [phone] Ref# [id number]

## 2024-04-29 ENCOUNTER — Other Ambulatory Visit: Payer: Self-pay | Admitting: Pharmacist

## 2024-04-29 ENCOUNTER — Other Ambulatory Visit (HOSPITAL_BASED_OUTPATIENT_CLINIC_OR_DEPARTMENT_OTHER): Payer: Self-pay

## 2024-04-29 ENCOUNTER — Other Ambulatory Visit: Payer: Self-pay | Admitting: Family Medicine

## 2024-04-29 ENCOUNTER — Other Ambulatory Visit (HOSPITAL_COMMUNITY): Payer: Self-pay

## 2024-04-29 DIAGNOSIS — E11649 Type 2 diabetes mellitus with hypoglycemia without coma: Secondary | ICD-10-CM

## 2024-04-29 NOTE — Telephone Encounter (Signed)
 Request also received via fax, per separate encounter, clinical information has been answered and faxed back. Will continue follow up in original PA encounter, thank you.

## 2024-04-29 NOTE — Telephone Encounter (Signed)
 Insurance company required additional info. Filled out and faxed to [phone].

## 2024-04-29 NOTE — Telephone Encounter (Signed)
 Pharmacy Patient Advocate Encounter  Received notification from [facility] ADVANTAGE/RX ADVANCE that Prior Authorization for Nexlizet  180-10MG  tablets has been APPROVED from 04/29/24 to 10/30/24. Ran test claim, Copay is $12.15. This test claim was processed through [facility]- copay amounts may vary at other pharmacies due to pharmacy/plan contracts, or as the patient moves through the different stages of their insurance plan.   PA #/Case ID/Reference #: [id number]

## 2024-04-30 ENCOUNTER — Other Ambulatory Visit: Payer: Self-pay

## 2024-05-02 ENCOUNTER — Other Ambulatory Visit (HOSPITAL_BASED_OUTPATIENT_CLINIC_OR_DEPARTMENT_OTHER): Payer: Self-pay

## 2024-05-02 MED ORDER — TRUEPLUS 5-BEVEL PEN NEEDLES 31G X 5 MM MISC
1 refills | Status: AC
  Filled 2024-05-02: qty 100, 90d supply, fill #0
  Filled 2024-07-21: qty 100, 90d supply, fill #1

## 2024-05-03 ENCOUNTER — Encounter: Payer: Self-pay | Admitting: Pharmacist

## 2024-05-03 ENCOUNTER — Encounter (HOSPITAL_COMMUNITY)
Admission: RE | Admit: 2024-05-03 | Discharge: 2024-05-03 | Disposition: A | Discharge status: Home / Self Care | Source: Ambulatory Visit | Attending: Gastroenterology | Admitting: Gastroenterology

## 2024-05-03 DIAGNOSIS — R6881 Early satiety: Secondary | ICD-10-CM | POA: Diagnosis not present

## 2024-05-03 DIAGNOSIS — R140 Abdominal distension (gaseous): Secondary | ICD-10-CM | POA: Insufficient documentation

## 2024-05-03 DIAGNOSIS — R1013 Epigastric pain: Secondary | ICD-10-CM | POA: Insufficient documentation

## 2024-05-03 DIAGNOSIS — K5903 Drug induced constipation: Secondary | ICD-10-CM | POA: Insufficient documentation

## 2024-05-03 MED ORDER — TECHNETIUM TC 99M SULFUR COLLOID
2.2000 | Freq: Once | INTRAVENOUS | Status: AC
  Administered 2024-05-03: 2.2 via ORAL

## 2024-05-04 ENCOUNTER — Other Ambulatory Visit (HOSPITAL_BASED_OUTPATIENT_CLINIC_OR_DEPARTMENT_OTHER): Payer: Self-pay

## 2024-05-04 MED ORDER — MOUNJARO 5 MG/0.5ML ~~LOC~~ SOAJ
5.0000 mg | SUBCUTANEOUS | 0 refills | Status: DC
  Filled 2024-05-04: qty 2, 28d supply, fill #0

## 2024-05-05 ENCOUNTER — Other Ambulatory Visit: Payer: Self-pay | Admitting: *Deleted

## 2024-05-05 ENCOUNTER — Other Ambulatory Visit: Payer: Self-pay | Admitting: Family Medicine

## 2024-05-05 ENCOUNTER — Other Ambulatory Visit (HOSPITAL_BASED_OUTPATIENT_CLINIC_OR_DEPARTMENT_OTHER): Payer: Self-pay

## 2024-05-05 DIAGNOSIS — M4802 Spinal stenosis, cervical region: Secondary | ICD-10-CM

## 2024-05-05 NOTE — Telephone Encounter (Signed)
 Pt is requesting refill of hydrocodone  to be sent downstairs to the pharmacy. Please advise.

## 2024-05-05 NOTE — Telephone Encounter (Signed)
 Requesting: NORCO Contract: 06/05/2023 UDS: 06/05/2023 Last OV: 01/19/24 Next OV: n/a Last Refill: 03/27/2024, #45--0 RF Database:   Please advise

## 2024-05-06 ENCOUNTER — Other Ambulatory Visit (HOSPITAL_BASED_OUTPATIENT_CLINIC_OR_DEPARTMENT_OTHER): Payer: Self-pay

## 2024-05-06 ENCOUNTER — Other Ambulatory Visit: Payer: Self-pay | Admitting: Family

## 2024-05-06 DIAGNOSIS — M4802 Spinal stenosis, cervical region: Secondary | ICD-10-CM

## 2024-05-06 MED ORDER — HYDROCODONE-ACETAMINOPHEN 5-325 MG PO TABS
1.0000 | ORAL_TABLET | Freq: Three times a day (TID) | ORAL | 0 refills | Status: DC | PRN
Start: 2024-05-06 — End: 2024-05-24
  Filled 2024-05-06: qty 45, 15d supply, fill #0

## 2024-05-16 ENCOUNTER — Other Ambulatory Visit (HOSPITAL_BASED_OUTPATIENT_CLINIC_OR_DEPARTMENT_OTHER): Payer: Self-pay

## 2024-05-17 ENCOUNTER — Telehealth: Payer: Self-pay | Admitting: Family Medicine

## 2024-05-17 NOTE — Telephone Encounter (Signed)
 Copied from CRM [id number]. Topic: Medicare AWV >> May 17, 2024 11:25 AM Juliana Ocean wrote: Reason for CRM: Called 05/17/2024 to sched AWV - NO VOICEMAIL  Ronald Petersen; Care Guide Ambulatory Clinical Support [facility] Direct Dial: [phone]

## 2024-05-20 ENCOUNTER — Other Ambulatory Visit: Payer: Self-pay | Admitting: Family Medicine

## 2024-05-20 ENCOUNTER — Other Ambulatory Visit: Payer: Self-pay

## 2024-05-20 ENCOUNTER — Other Ambulatory Visit (HOSPITAL_BASED_OUTPATIENT_CLINIC_OR_DEPARTMENT_OTHER): Payer: Self-pay

## 2024-05-20 DIAGNOSIS — M4802 Spinal stenosis, cervical region: Secondary | ICD-10-CM

## 2024-05-20 MED ORDER — EMPAGLIFLOZIN 25 MG PO TABS
25.0000 mg | ORAL_TABLET | Freq: Every day | ORAL | 0 refills | Status: DC
  Filled 2024-05-20: qty 30, 30d supply, fill #0

## 2024-05-20 NOTE — Telephone Encounter (Signed)
 Requesting: hydrocodone  5/325mg  Contract: 06/05/23 UDS: 06/05/23 Last Visit: 01/19/24 Next Visit: None Last Refill: 05/06/24 #45 and 0RF  Please Advise

## 2024-05-24 ENCOUNTER — Other Ambulatory Visit (HOSPITAL_BASED_OUTPATIENT_CLINIC_OR_DEPARTMENT_OTHER): Payer: Self-pay

## 2024-05-24 ENCOUNTER — Other Ambulatory Visit: Payer: Self-pay | Admitting: Family Medicine

## 2024-05-24 DIAGNOSIS — G8929 Other chronic pain: Secondary | ICD-10-CM

## 2024-05-24 DIAGNOSIS — M4802 Spinal stenosis, cervical region: Secondary | ICD-10-CM

## 2024-05-24 NOTE — Telephone Encounter (Signed)
 Needs appt

## 2024-05-25 ENCOUNTER — Other Ambulatory Visit (HOSPITAL_BASED_OUTPATIENT_CLINIC_OR_DEPARTMENT_OTHER): Payer: Self-pay

## 2024-05-25 MED ORDER — TIZANIDINE HCL 4 MG PO TABS
4.0000 mg | ORAL_TABLET | Freq: Four times a day (QID) | ORAL | 2 refills | Status: DC | PRN
  Filled 2024-05-25: qty 30, 8d supply, fill #0
  Filled 2024-06-09: qty 30, 8d supply, fill #1
  Filled 2024-07-03: qty 30, 8d supply, fill #2

## 2024-05-25 MED ORDER — HYDROCODONE-ACETAMINOPHEN 5-325 MG PO TABS
1.0000 | ORAL_TABLET | Freq: Three times a day (TID) | ORAL | 0 refills | Status: DC | PRN
  Filled 2024-05-25: qty 45, 15d supply, fill #0

## 2024-05-25 NOTE — Telephone Encounter (Signed)
 Requesting: Norco 5-325 Contract: 06/05/2023 UDS: 06/05/2023 Last Visit: 01/19/2024 Next Visit: N/A Last Refill: 05/06/2024  Please Advise

## 2024-05-31 NOTE — Progress Notes (Signed)
 Chief Complaint: Primary GI MD:  HPI:  *** is a  ***  who was referred to me by Crecencio Dodge, Candida Chalk, * for a complaint of *** .     Discussed the use of AI scribe software for clinical note transcription with the patient, who gave verbal consent to proceed.  History of Present Illness      PREVIOUS GI WORKUP     Past Medical History:  Diagnosis Date   Allergy    Anxiety    Arthritis    CAD (coronary artery disease)    Cancer (HCC)    CHF (congestive heart failure) (HCC)    Complication of anesthesia    aspirated with back surgery at age 57   Depression    Diabetes mellitus Type II    GERD (gastroesophageal reflux disease)    Hyperlipidemia    Hypertension    Mild aortic stenosis    Mild carotid artery disease (HCC)    Neuromuscular disorder (HCC)    NEUROPATHY   Plavix  resistance    abnormal cytochrome P450 2c19 genotype in 2021 inferring suboptimal Plavix  platelet inhibition,   Right rotator cuff tear 11/23/2018   Sleep apnea    no CPAP    Past Surgical History:  Procedure Laterality Date   APPENDECTOMY     ARTHOSCOPIC ROTAOR CUFF REPAIR Right 11/23/2018   Procedure: ARTHROSCOPIC ROTATOR CUFF REPAIR;  Surgeon: Osa Blase, MD;  Location: [facility];  Service: Orthopedics;  Laterality: Right;   CARDIAC VALVE REPLACEMENT     CHOLECYSTECTOMY     CORONARY ARTERY BYPASS GRAFT N/A 12/24/2021   x2 LIMA to LAD; SVG to Obtuse Marginal   CORONARY STENT INTERVENTION N/A 11/19/2020   Procedure: CORONARY STENT INTERVENTION;  Surgeon: Sammy Crisp, MD;  Location: [facility];  Service: Cardiovascular;  Laterality: N/A;   CORONARY ULTRASOUND/IVUS N/A 11/19/2020   Procedure: Intravascular Ultrasound/IVUS;  Surgeon: Sammy Crisp, MD;  Location: [facility];  Service: Cardiovascular;  Laterality: N/A;   ENDOVEIN HARVEST OF GREATER SAPHENOUS VEIN Right 12/24/2021   Procedure: ENDOVEIN HARVEST OF GREATER SAPHENOUS VEIN;  Surgeon:  Hilarie Lovely, MD;  Location: [facility] OR;  Service: Open Heart Surgery;  Laterality: Right;   FRACTURE SURGERY     IR THORACENTESIS ASP PLEURAL SPACE W/IMG GUIDE  01/31/2022   LEFT HEART CATH AND CORONARY ANGIOGRAPHY N/A 11/19/2020   Procedure: LEFT HEART CATH AND CORONARY ANGIOGRAPHY;  Surgeon: Sammy Crisp, MD;  Location: [facility];  Service: Cardiovascular;  Laterality: N/A;   LEFT HEART CATH AND CORONARY ANGIOGRAPHY N/A 12/24/2020   Procedure: LEFT HEART CATH AND CORONARY ANGIOGRAPHY;  Surgeon: Swazil[place], Peter M, MD;  Location: [facility];  Service: Cardiovascular;  Laterality: N/A;   LEFT HEART CATH AND CORONARY ANGIOGRAPHY N/A 12/16/2021   Procedure: LEFT HEART CATH AND CORONARY ANGIOGRAPHY;  Surgeon: Arleen Lacer, MD;  Location: [facility] INV[facility] CV LAB;  Service: Cardiovascular;  Laterality: N/A;   LEFT HEART CATH AND CORS/GRAFTS ANGIOGRAPHY N/A 03/18/2023   Procedure: LEFT HEART CATH AND CORS/GRAFTS ANGIOGRAPHY;  Surgeon: Lucendia Rusk, MD;  Location: [facility] INVASIVE CV LAB;  Service: Cardiovascular;  Laterality: N/A;   LUMBAR LAMINECTOMY     SHOULDER ARTHROSCOPY WITH ROTATOR CUFF REPAIR AND SUBACROMIAL DECOMPRESSION Right 11/23/2018   Procedure: SHOULDER ARTHROSCOPY WITH ROTATOR CUFF REPAIR AND SUBACROMIAL DECOMPRESSION;  Surgeon: Osa Blase, MD;  Location: [facility];  Service: Orthopedics;  Laterality: Right;   SPINE SURGERY  TEE WITHOUT CARDIOVERSION N/A 12/24/2021   Procedure: TRANSESOPHAGEAL ECHOCARDIOGRAM (TEE);  Surgeon: Hilarie Lovely, MD;  Location: [facility] OR;  Service: Open Heart Surgery;  Laterality: N/A;    Current Outpatient Medications  Medication Sig Dispense Refill   acetaminophen  (TYLENOL ) 650 MG CR tablet Take 650 mg by mouth every 8 (eight) hours as needed for pain.     aspirin  EC 325 MG EC tablet Take 1 tablet (325 mg total) by mouth daily.     Bempedoic Acid -Ezetimibe  (NEXLIZET ) 180-10 MG TABS Take 1 tablet by mouth  daily. (Replaces ezetimibe  10mg ) 90 tablet 1   ELDERBERRY PO Take 300 mg by mouth daily.     empagliflozin  (JARDIANCE ) 25 MG TABS tablet Take 1 tablet (25 mg total) by mouth daily before breakfast. 30 tablet 0   famotidine  (PEPCID ) 40 MG tablet Take 1 tablet (40 mg total) by mouth at bedtime. 30 tablet 2   fenofibrate  160 MG tablet Take 1 tablet (160 mg total) by mouth daily. 90 tablet 0   gabapentin  (NEURONTIN ) 300 MG capsule Take 3 capsules (900 mg total) by mouth 3 (three) times daily. 270 capsule 11   glucose blood (ONETOUCH VERIO) test strip USE AS INSTRUCTED TO CHECK BLOOD SUGAR ONCE A DAY 100 strip 12   HYDROcodone -acetaminophen  (NORCO/VICODIN) 5-325 MG tablet Take 1 tablet by mouth every 8 (eight) hours as needed. 45 tablet 0   insulin  degludec (TRESIBA  FLEXTOUCH) 100 UNIT/ML FlexTouch Pen Inject 10 Units into the skin daily 3 mL 2   Insulin  Pen Needle (TRUEPLUS 5-BEVEL PEN NEEDLES) 31G X 5 MM MISC Use to inject insulin  as directed. 100 each 1   Magnesium  Citrate 200 MG TABS Take 400 mg by mouth daily.     nitroGLYCERIN  (NITROSTAT ) 0.4 MG SL tablet Place 1 tablet (0.4 mg total) under the tongue every 5 (five) minutes as needed for chest pain. 90 tablet 3   ondansetron  (ZOFRAN ) 4 MG tablet Take 1 tablet (4 mg total) by mouth every 8 (eight) hours as needed. 20 tablet 2   pantoprazole  (PROTONIX ) 40 MG tablet Take 1 tablet (40 mg total) by mouth 2 (two) times daily. 180 tablet 1   ranolazine  (RANEXA ) 500 MG 12 hr tablet Take 1 tablet (500 mg total) by mouth 2 (two) times daily. 180 tablet 3   sertraline  (ZOLOFT ) 100 MG tablet Take 1.5 tablets (150 mg total) by mouth daily. 135 tablet 1   tirzepatide  (MOUNJARO ) 5 MG/0.5ML Pen Inject 5 mg into the skin once a week. 2 mL 0   tiZANidine  (ZANAFLEX ) 4 MG tablet Take 1 tablet (4 mg total) by mouth every 6 (six) hours as needed for muscle spasms. 30 tablet 2   traZODone  (DESYREL ) 50 MG tablet Take 1 tablet (50 mg total) by mouth at bedtime as needed  for sleep. 90 tablet 1   umeclidinium-vilanterol (ANORO ELLIPTA ) 62.5-25 MCG/ACT AEPB Inhale 1 puff into the lungs daily. 60 each 1   vitamin E  180 MG (400 UNITS) capsule Take 1,600 Units by mouth daily.     No current facility-administered medications for this visit.    Allergies as of 06/01/2024 - Review Complete 04/01/2024  Allergen Reaction Noted   Niacin Anaphylaxis 12/08/2006    Family History  Problem Relation Age of Onset   COPD Mother    Stroke Father    Ovarian cancer Sister    Stomach cancer Sister    Heart disease Sister        MI   Heart disease  Sister        MI   Stomach cancer Maternal Grandmother    Alcohol abuse Other    Depression Other    Arthritis Other    Hypertension Other    Coronary artery disease Other    Ovarian cancer Other        neice   Ovarian cancer Other        neice   Colon polyps Neg Hx    Colon cancer Neg Hx     Social History   Socioeconomic History   Marital status: Married    Spouse name: Sherian Dimitri   Number of children: 2   Years of education: Not on file   Highest education level: GED or equivalent  Occupational History   Occupation: self employed    Associate Professor: UNEMPLOYED  Tobacco Use   Smoking status: Former    Current packs/day: 0.00    Types: Cigarettes    Start date: 12/29/1969    Quit date: 12/29/1985    Years since quitting: 38.4   Smokeless tobacco: Never  Vaping Use   Vaping status: Never Used  Substance and Sexual Activity   Alcohol use: Not Currently   Drug use: No   Sexual activity: Yes    Partners: Female  Other Topics Concern   Not on file  Social History Narrative   Exercise-- 3 days   Pt has hs degree   Right handed   One story home   Drinks caffeine 2 cups in am   Are you right handed or left handed?    Are you currently employed ?    What is your current occupation? retired   Do you live at home alone?   Who lives with you?  wife   What type of home do you live in: 1 story or 2 story?         Social Drivers of Health   Financial Resource Strain: High Risk (12/15/2023)   Overall Financial Resource Strain (CARDIA)    Difficulty of Paying Living Expenses: Hard  Food Insecurity: Food Insecurity Present (12/15/2023)   Hunger Vital Sign    Worried About Running Out of Food in the Last Year: Sometimes true    Ran Out of Food in the Last Year: Sometimes true  Transportation Needs: No Transportation Needs (12/15/2023)   PRAPARE - Administrator, Civil Service (Medical): No    Lack of Transportation (Non-Medical): No  Physical Activity: Insufficiently Active (12/15/2023)   Exercise Vital Sign    Days of Exercise per Week: 2 days    Minutes of Exercise per Session: 20 min  Stress: No Stress Concern Present (12/15/2023)   [employer] of Occupational Health - Occupational Stress Questionnaire    Feeling of Stress : Only a little  Social Connections: Unknown (12/15/2023)   Social Connection and Isolation Panel [NHANES]    Frequency of Communication with Friends and Family: Three times a week    Frequency of Social Gatherings with Friends and Family: Three times a week    Attends Religious Services: More than 4 times per year    Active Member of Clubs or Organizations: Patient declined    Attends Banker Meetings: Not on file    Marital Status: Married  Intimate Partner Violence: Not At Risk (03/17/2023)   Humiliation, Afraid, Rape, and Kick questionnaire    Fear of Current or Ex-Partner: No    Emotionally Abused: No    Physically Abused: No  Sexually Abused: No    Review of Systems:    Constitutional: No weight loss, fever, chills, weakness or fatigue HEENT: Eyes: No change in vision               Ears, Nose, Throat:  No change in hearing or congestion Skin: No rash or itching Cardiovascular: No chest pain, chest pressure or palpitations   Respiratory: No SOB or cough Gastrointestinal: See HPI and otherwise negative Genitourinary: No  dysuria or change in urinary frequency Neurological: No headache, dizziness or syncope Musculoskeletal: No new muscle or joint pain Hematologic: No bleeding or bruising Psychiatric: No history of depression or anxiety    Physical Exam:  Vital signs: There were no vitals taken for this visit.  Constitutional: NAD, alert and cooperative Head:  Normocephalic and atraumatic. Eyes:   PEERL, EOMI. No icterus. Conjunctiva pink. Respiratory: Respirations even and unlabored. Lungs clear to auscultation bilaterally.   No wheezes, crackles, or rhonchi.  Cardiovascular:  Regular rate and rhythm. No peripheral edema, cyanosis or pallor.  Gastrointestinal:  Soft, nondistended, nontender. No rebound or guarding. Normal bowel sounds. No appreciable masses or hepatomegaly. Rectal:  Declines Msk:  Symmetrical without gross deformities. Without edema, no deformity or joint abnormality.  Neurologic:  Alert and  oriented x4;  grossly normal neurologically.  Skin:   Dry and intact without significant lesions or rashes. Psychiatric: Oriented to person, place and time. Demonstrates good judgement and reason without abnormal affect or behaviors.  Physical Exam    RELEVANT LABS AND IMAGING: CBC    Component Value Date/Time   WBC 7.2 01/19/2024 1752   RBC 6.16 (H) 01/19/2024 1752   HGB 17.4 (H) 01/19/2024 1752   HGB 14.7 05/14/2022 1236   HCT 51.9 (H) 01/19/2024 1752   HCT 44.9 05/14/2022 1236   PLT 181 01/19/2024 1752   PLT 174 05/14/2022 1236   MCV 84.3 01/19/2024 1752   MCV 80 05/14/2022 1236   MCH 28.2 01/19/2024 1752   MCHC 33.5 01/19/2024 1752   RDW 12.4 01/19/2024 1752   RDW 16.3 (H) 05/14/2022 1236   LYMPHSABS 1.9 12/15/2023 1755   LYMPHSABS 1.7 05/14/2022 1236   MONOABS 0.5 12/15/2023 1755   EOSABS 108 01/19/2024 1752   EOSABS 0.2 05/14/2022 1236   BASOSABS 29 01/19/2024 1752   BASOSABS 0.1 05/14/2022 1236    CMP     Component Value Date/Time   NA 137 01/19/2024 1752   NA 140  05/14/2022 1236   K 4.3 01/19/2024 1752   CL 99 01/19/2024 1752   CO2 28 01/19/2024 1752   GLUCOSE 128 (H) 01/19/2024 1752   GLUCOSE 85 12/10/2006 1603   BUN 36 (H) 01/19/2024 1752   BUN 30 (H) 05/14/2022 1236   CREATININE 0.91 01/19/2024 1752   CALCIUM  10.0 01/19/2024 1752   PROT 7.6 01/19/2024 1752   PROT 7.2 02/18/2022 0832   ALBUMIN  4.5 12/15/2023 1755   ALBUMIN  4.7 02/18/2022 0832   AST 17 01/19/2024 1752   ALT 17 01/19/2024 1752   ALKPHOS 94 12/15/2023 1755   BILITOT 0.8 01/19/2024 1752   BILITOT 0.4 02/18/2022 0832   GFRNONAA >60 03/17/2023 1451   GFRAA 82 01/17/2021 1157     Assessment/Plan:   Assessment and Plan Assessment & Plan        Gigi [facility] 05/31/2024, 12:19 PM  Cc: Crecencio Dodge, Candida Chalk, *

## 2024-06-01 ENCOUNTER — Ambulatory Visit: Admitting: Gastroenterology

## 2024-06-01 ENCOUNTER — Other Ambulatory Visit: Payer: Self-pay | Admitting: Family Medicine

## 2024-06-01 ENCOUNTER — Encounter: Payer: Self-pay | Admitting: Gastroenterology

## 2024-06-01 ENCOUNTER — Other Ambulatory Visit: Payer: Self-pay

## 2024-06-01 ENCOUNTER — Other Ambulatory Visit

## 2024-06-01 ENCOUNTER — Other Ambulatory Visit (HOSPITAL_BASED_OUTPATIENT_CLINIC_OR_DEPARTMENT_OTHER): Payer: Self-pay

## 2024-06-01 VITALS — BP 110/60 | HR 61 | Ht 71.0 in | Wt 195.0 lb

## 2024-06-01 DIAGNOSIS — K5909 Other constipation: Secondary | ICD-10-CM

## 2024-06-01 DIAGNOSIS — M4802 Spinal stenosis, cervical region: Secondary | ICD-10-CM

## 2024-06-01 DIAGNOSIS — K219 Gastro-esophageal reflux disease without esophagitis: Secondary | ICD-10-CM | POA: Diagnosis not present

## 2024-06-01 DIAGNOSIS — T402X5A Adverse effect of other opioids, initial encounter: Secondary | ICD-10-CM

## 2024-06-01 DIAGNOSIS — R194 Change in bowel habit: Secondary | ICD-10-CM

## 2024-06-01 DIAGNOSIS — R1013 Epigastric pain: Secondary | ICD-10-CM | POA: Diagnosis not present

## 2024-06-01 DIAGNOSIS — R6881 Early satiety: Secondary | ICD-10-CM

## 2024-06-01 DIAGNOSIS — K5903 Drug induced constipation: Secondary | ICD-10-CM | POA: Diagnosis not present

## 2024-06-01 DIAGNOSIS — R140 Abdominal distension (gaseous): Secondary | ICD-10-CM | POA: Diagnosis not present

## 2024-06-01 NOTE — Progress Notes (Signed)
 Agree with assessment and plan as outlined.

## 2024-06-01 NOTE — Patient Instructions (Signed)
 You have been given a testing kit to check for small intestine bacterial overgrowth (SIBO) which is completed by a company named [facility]. Make sure to return your test in the mail using the return mailing label given to you along with the kit. The test order, your demographic and insurance information have all already been sent to the company. [facility] will collect an upfront charge of $99.74 for commercial insurance plans and $209.74 if you are paying cash. Make sure to discuss with [facility] PRIOR to having the test to see if they have gotten information from your insurance company as to how much your testing will cost out of pocket, if any. Please contact [facility] at phone number [phone] to get instructions regarding how to perform the test as our office is unable to give specific testing instructions.   Your provider has requested that you go to the basement level for lab work before leaving today. Press "B" on the elevator. The lab is located at the first door on the left as you exit the elevator.   _______________________________________________________  If your blood pressure at your visit was 140/90 or greater, please contact your primary care physician to follow up on this.  _______________________________________________________  If you are age 69 or older, your body mass index should be between 23-30. Your Body mass index is 27.2 kg/m. If this is out of the aforementioned range listed, please consider follow up with your Primary Care Provider.  If you are age 70 or younger, your body mass index should be between 19-25. Your Body mass index is 27.2 kg/m. If this is out of the aformentioned range listed, please consider follow up with your Primary Care Provider.   ________________________________________________________  The [facility] providers would like to encourage you to use MYCHART to communicate with providers for non-urgent requests or questions.   Due to long hold times on the telephone, sending your provider a message by [facility] may be a faster and more efficient way to get a response.  Please allow 48 business hours for a response.  Please remember that this is for non-urgent requests.  _______________________________________________________

## 2024-06-02 ENCOUNTER — Other Ambulatory Visit (HOSPITAL_BASED_OUTPATIENT_CLINIC_OR_DEPARTMENT_OTHER): Payer: Self-pay

## 2024-06-07 ENCOUNTER — Other Ambulatory Visit (HOSPITAL_BASED_OUTPATIENT_CLINIC_OR_DEPARTMENT_OTHER): Payer: Self-pay

## 2024-06-07 ENCOUNTER — Other Ambulatory Visit: Payer: Self-pay | Admitting: Cardiovascular Disease

## 2024-06-07 ENCOUNTER — Other Ambulatory Visit: Payer: Self-pay | Admitting: Family Medicine

## 2024-06-07 DIAGNOSIS — E11649 Type 2 diabetes mellitus with hypoglycemia without coma: Secondary | ICD-10-CM

## 2024-06-07 DIAGNOSIS — M4802 Spinal stenosis, cervical region: Secondary | ICD-10-CM

## 2024-06-09 ENCOUNTER — Other Ambulatory Visit (HOSPITAL_BASED_OUTPATIENT_CLINIC_OR_DEPARTMENT_OTHER): Payer: Self-pay

## 2024-06-10 ENCOUNTER — Other Ambulatory Visit: Payer: Self-pay | Admitting: Family Medicine

## 2024-06-10 DIAGNOSIS — M4802 Spinal stenosis, cervical region: Secondary | ICD-10-CM

## 2024-06-13 ENCOUNTER — Other Ambulatory Visit (HOSPITAL_BASED_OUTPATIENT_CLINIC_OR_DEPARTMENT_OTHER): Payer: Self-pay

## 2024-06-13 ENCOUNTER — Other Ambulatory Visit: Payer: Self-pay

## 2024-06-13 MED ORDER — MOUNJARO 5 MG/0.5ML ~~LOC~~ SOAJ
5.0000 mg | SUBCUTANEOUS | 0 refills | Status: DC
  Filled 2024-06-13: qty 2, 28d supply, fill #0

## 2024-06-13 MED ORDER — HYDROCODONE-ACETAMINOPHEN 5-325 MG PO TABS
1.0000 | ORAL_TABLET | Freq: Three times a day (TID) | ORAL | 0 refills | Status: DC | PRN
  Filled 2024-06-13: qty 45, 15d supply, fill #0

## 2024-06-13 NOTE — Telephone Encounter (Signed)
 Requesting: hydrocodone  5-325mg   Contract: 06/05/23 UDS: 06/05/23 Last Visit: 01/19/24 Next Visit:  None Last Refill: 05/25/24 #45 and 0RF   Please Advise

## 2024-06-16 ENCOUNTER — Other Ambulatory Visit (HOSPITAL_BASED_OUTPATIENT_CLINIC_OR_DEPARTMENT_OTHER): Payer: Self-pay

## 2024-06-16 ENCOUNTER — Other Ambulatory Visit: Payer: Self-pay | Admitting: Gastroenterology

## 2024-06-16 MED ORDER — FAMOTIDINE 40 MG PO TABS
40.0000 mg | ORAL_TABLET | Freq: Every day | ORAL | 2 refills | Status: DC
Start: 2024-06-16 — End: 2024-09-14
  Filled 2024-06-16 (×2): qty 30, 30d supply, fill #0
  Filled 2024-07-18: qty 30, 30d supply, fill #1
  Filled 2024-08-15: qty 30, 30d supply, fill #2

## 2024-06-22 ENCOUNTER — Other Ambulatory Visit: Payer: Self-pay | Admitting: Family Medicine

## 2024-06-22 ENCOUNTER — Other Ambulatory Visit: Payer: Self-pay

## 2024-06-22 ENCOUNTER — Other Ambulatory Visit (HOSPITAL_BASED_OUTPATIENT_CLINIC_OR_DEPARTMENT_OTHER): Payer: Self-pay

## 2024-06-22 DIAGNOSIS — E1169 Type 2 diabetes mellitus with other specified complication: Secondary | ICD-10-CM

## 2024-06-22 DIAGNOSIS — G4700 Insomnia, unspecified: Secondary | ICD-10-CM

## 2024-06-22 MED ORDER — TRAZODONE HCL 50 MG PO TABS
50.0000 mg | ORAL_TABLET | Freq: Every evening | ORAL | 0 refills | Status: DC | PRN
  Filled 2024-06-22 – 2024-08-19 (×4): qty 30, 30d supply, fill #0

## 2024-06-22 MED ORDER — FENOFIBRATE 160 MG PO TABS
160.0000 mg | ORAL_TABLET | Freq: Every day | ORAL | 0 refills | Status: DC
  Filled 2024-06-22: qty 30, 30d supply, fill #0

## 2024-06-22 MED ORDER — EMPAGLIFLOZIN 25 MG PO TABS
25.0000 mg | ORAL_TABLET | Freq: Every day | ORAL | 0 refills | Status: DC
Start: 2024-06-22 — End: 2024-07-21
  Filled 2024-06-22: qty 30, 30d supply, fill #0

## 2024-06-23 ENCOUNTER — Other Ambulatory Visit (HOSPITAL_BASED_OUTPATIENT_CLINIC_OR_DEPARTMENT_OTHER): Payer: Self-pay

## 2024-06-24 ENCOUNTER — Ambulatory Visit (INDEPENDENT_AMBULATORY_CARE_PROVIDER_SITE_OTHER): Payer: Medicare HMO | Admitting: Podiatry

## 2024-06-24 ENCOUNTER — Encounter: Payer: Self-pay | Admitting: Podiatry

## 2024-06-24 DIAGNOSIS — E119 Type 2 diabetes mellitus without complications: Secondary | ICD-10-CM

## 2024-06-24 DIAGNOSIS — E1140 Type 2 diabetes mellitus with diabetic neuropathy, unspecified: Secondary | ICD-10-CM | POA: Diagnosis not present

## 2024-06-24 NOTE — Progress Notes (Signed)
  Subjective:  Patient ID: Ronald Petersen, male    DOB: 1956/09/08,   MRN: [id number]  Chief Complaint  Patient presents with   Diabetes    I guess I'm here for a yearly check.  I'm Diabetic.  Saw Dr. Antonio Meth - 01/23/2024; A1c - 7.4    68 y.o. male presents for follow-up of peripheral neuropathy. Relates burning and tingling and numbness in their feet. He has been on gabapentin  800 mg 3 times daily.  Patient is diabetic and last A1c was  Lab Results  Component Value Date   HGBA1C 7.4 (H) 01/19/2024   . Requesting new pair of shoes.   PCP:  Antonio Meth, [name], DO    . Denies any other pedal complaints. Denies n/v/f/c.   Past Medical History:  Diagnosis Date   Allergy    Anxiety    Arthritis    CAD (coronary artery disease)    Cancer (HCC)    CHF (congestive heart failure) (HCC)    Complication of anesthesia    aspirated with back surgery at age 51   Depression    Diabetes mellitus Type II    GERD (gastroesophageal reflux disease)    Hyperlipidemia    Hypertension    Mild aortic stenosis    Mild carotid artery disease (HCC)    Neuromuscular disorder (HCC)    NEUROPATHY   Plavix  resistance    abnormal cytochrome P450 2c19 genotype in 2021 inferring suboptimal Plavix  platelet inhibition,   Right rotator cuff tear 11/23/2018   Sleep apnea    no CPAP    Objective:  Physical Exam: Vascular: DP/PT pulses 2/4 bilateral. CFT <3 seconds. Absent hair growth on digits. Edema noted to bilateral lower extremities. Xerosis noted bilaterally.  Skin. No lacerations or abrasions bilateral feet. Nails 1-5 bilateral  are normal in appearance. Hyperkeratosis noted to distal right hallux Musculoskeletal: MMT 5/5 bilateral lower extremities in DF, PF, Inversion and Eversion. Deceased ROM in DF of ankle joint.  Neurological: Sensation intact to light touch. Protective sensation diminished bilateral.    Assessment:   1. Type 2 diabetes mellitus with diabetic neuropathy, without  long-term current use of insulin  (HCC)   2. Encounter for diabetic foot exam (HCC)      Plan:  Patient was evaluated and treated and all questions answered. -Discussed and educated patient on diabetic foot care, especially with  regards to the vascular, neurological and musculoskeletal systems.  -Stressed the importance of good glycemic control and the detriment of not  controlling glucose levels in relation to the foot. -Discussed supportive shoes at all times and checking feet regularly.  -Mechanically debrided hyperkeratosis without incident as courtesy.  -DM shoe prescirption for Meryle provided.  -Answered all patient questions -Patient to return  in 1 year for DM foot check.  -Patient advised to call the office if any problems or questions arise in the meantime.   Asberry Failing, DPM

## 2024-06-28 ENCOUNTER — Encounter: Payer: Self-pay | Admitting: Family Medicine

## 2024-06-28 ENCOUNTER — Ambulatory Visit: Admitting: Family Medicine

## 2024-06-28 ENCOUNTER — Telehealth: Payer: Self-pay | Admitting: Family Medicine

## 2024-06-28 ENCOUNTER — Other Ambulatory Visit (HOSPITAL_BASED_OUTPATIENT_CLINIC_OR_DEPARTMENT_OTHER): Payer: Self-pay

## 2024-06-28 VITALS — BP 118/60 | HR 60 | Temp 97.8°F | Resp 18 | Ht 71.0 in | Wt 199.0 lb

## 2024-06-28 DIAGNOSIS — R29898 Other symptoms and signs involving the musculoskeletal system: Secondary | ICD-10-CM | POA: Diagnosis not present

## 2024-06-28 DIAGNOSIS — M4802 Spinal stenosis, cervical region: Secondary | ICD-10-CM | POA: Diagnosis not present

## 2024-06-28 DIAGNOSIS — M51369 Other intervertebral disc degeneration, lumbar region without mention of lumbar back pain or lower extremity pain: Secondary | ICD-10-CM

## 2024-06-28 MED ORDER — OXYCODONE-ACETAMINOPHEN 5-325 MG PO TABS
1.0000 | ORAL_TABLET | ORAL | 0 refills | Status: DC | PRN
  Filled 2024-06-28: qty 180, 30d supply, fill #0

## 2024-06-28 NOTE — Progress Notes (Signed)
 Established Patient Office Visit  Subjective   Patient ID: Ronald Petersen, male    DOB: July 07, 1956  Age: 68 y.o. MRN: [id number]  Chief Complaint  Patient presents with   Back Pain    Sxs ongoing and have gotten worse    HPI Discussed the use of AI scribe software for clinical note transcription with the patient, who gave verbal consent to proceed.  History of Present Illness Ronald Petersen is a 68 year old male with chronic neck and back pain who presents with worsening pain and weakness.  He has chronic neck and back pain that has been worsening over time. The pain is described as severe, akin to 'sticking a knife in my hip,' and is exacerbated by movement, such as sitting down and getting back up. This significantly impacts his mobility, causing him to shuffle when walking. Previous attempts at physical therapy worsened his back problems, and hydrocodone , taken every four to five hours, does not alleviate his pain.  He experiences significant weakness in his legs, leading to concerns about falling, as he struggles with activities such as stepping up into a van. He also reports numbness in his hands, particularly at work, and notes that he drops small objects. No significant weakness in his arms is noted.  He has a history of surgery, though details are unspecified, and mentions previous use of oxycodone , which he found effective. Currently, he is taking hydrocodone  and gabapentin , but gabapentin  causes his legs to not work at all. He wants stronger pain management options.  He mentions a recent visit to a facility for diabetic shoes and inserts, indicating ongoing management of diabetes-related foot care. He is awaiting further contact regarding this issue.  He recently moved and is planning a trip to the beach with his daughter for her anniversary, which coincides with his birthday.   Patient Active Problem List   Diagnosis Date Noted   Dependence on respirator  (ventilator) status (HCC) 01/22/2024   Mixed restrictive and obstructive lung disease (HCC) 01/22/2024   Hyperglycemia 01/22/2024   Lung nodule 01/22/2024   Abdominal pain 03/18/2023   Capsulitis of left shoulder 03/12/2023   Lumbar radiculopathy 08/19/2022   Suspicious nevus 08/15/2022   Bulge of lumbar disc without myelopathy 08/15/2022   Cervical radiculopathy 08/04/2022   Nocturia 06/03/2022   Atypical chest pain 01/25/2022   Pleural effusion 01/24/2022   S/P CABG x 2 12/24/2021   Coronary artery disease involving native coronary artery of native heart with unstable angina pectoris (HCC) 12/16/2021   Chronic pain syndrome 06/07/2021   Mild neurocognitive disorder due to multiple etiologies 05/10/2021   Type II diabetes mellitus with hypoglycemia (HCC) 01/22/2021   Type 2 diabetes mellitus with diabetic polyneuropathy, without long-term current use of insulin  (HCC) 01/22/2021   Metabolic syndrome 01/17/2021   Coronary artery disease 01/17/2021   Anxiety    Arthritis    Cancer (HCC)    Complication of anesthesia    GERD (gastroesophageal reflux disease)    Hyperlipidemia    Hypertension    Neuromuscular disorder (HCC)    Sleep apnea    Coronary artery disease involving native coronary artery of native heart with angina pectoris (HCC) 11/20/2020   CAD S/P DES PCI LAD & LCx-OM 11/20/2020   Unstable angina (HCC) 11/19/2020   Abnormal cardiac CT angiography 11/19/2020   Shortness of breath 10/02/2020   Dizziness 10/02/2020   Chest pain 10/02/2020   Obesity (BMI 30-39.9) 10/02/2020   Type 2 diabetes  mellitus with diabetic neuropathy, without long-term current use of insulin  (HCC) 06/06/2020   Uncontrolled type 2 diabetes mellitus with hyperglycemia (HCC) 05/31/2020   Blurry vision 02/07/2020   Spinal stenosis of lumbar region 02/07/2020   Frequent falls 02/07/2020   Foraminal stenosis of cervical region 07/26/2019   Aortic atherosclerosis (HCC) 05/27/2019   Chronic bilateral  low back pain without sciatica 05/20/2019   Carpal tunnel syndrome, bilateral 05/20/2019   Weakness of lower extremity 01/05/2019   Falling episodes 01/05/2019   Right rotator cuff tear 11/23/2018   Neck pain 11/04/2018   Acute pain of right shoulder 11/04/2018   Gastroesophageal reflux disease 11/04/2018   Hyperlipidemia associated with type 2 diabetes mellitus (HCC) 11/04/2018   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 06/28/2018   Essential hypertension 09/14/2017   Preventative health care 06/12/2016   OSA (obstructive sleep apnea) 04/07/2016   Joint pain 04/05/2016   NSAID long-term use 12/13/2015   Nausea 12/13/2015   History of colonic polyps 12/13/2015   Weight loss 12/13/2015   Depression 11/06/2015   Metatarsal deformity 02/20/2015   Equinus deformity of foot, acquired 02/20/2015   Plantar fasciitis, bilateral 02/15/2015   Left wrist injury 06/15/2014   Peripheral neuropathy 04/01/2012   Fatigue 11/05/2010   OTHER SPECIFIED DISORDER OF PENIS 03/26/2010   NAUSEA 03/26/2010   Pain in limb 10/17/2008   NECK PAIN, CHRONIC 11/24/2007   Diabetes mellitus, type II (HCC) 01/27/2007   Hyperlipidemia LDL goal <70 01/27/2007   Past Medical History:  Diagnosis Date   Allergy    Anxiety    Arthritis    CAD (coronary artery disease)    Cancer (HCC)    CHF (congestive heart failure) (HCC)    Complication of anesthesia    aspirated with back surgery at age 54   Depression    Diabetes mellitus Type II    GERD (gastroesophageal reflux disease)    Hyperlipidemia    Hypertension    Mild aortic stenosis    Mild carotid artery disease (HCC)    Neuromuscular disorder (HCC)    NEUROPATHY   Plavix  resistance    abnormal cytochrome P450 2c19 genotype in 2021 inferring suboptimal Plavix  platelet inhibition,   Right rotator cuff tear 11/23/2018   Sleep apnea    no CPAP   Past Surgical History:  Procedure Laterality Date   APPENDECTOMY      ARTHOSCOPIC ROTAOR CUFF REPAIR Right 11/23/2018   Procedure: ARTHROSCOPIC ROTATOR CUFF REPAIR;  Surgeon: Josefina Chew, MD;  Location:  SURGERY CENTER;  Service: Orthopedics;  Laterality: Right;   CARDIAC VALVE REPLACEMENT     CHOLECYSTECTOMY     CORONARY ARTERY BYPASS GRAFT N/A 12/24/2021   x2 LIMA to LAD; SVG to Obtuse Marginal   CORONARY STENT INTERVENTION N/A 11/19/2020   Procedure: CORONARY STENT INTERVENTION;  Surgeon: Mady Bruckner, MD;  Location: [facility];  Service: Cardiovascular;  Laterality: N/A;   CORONARY ULTRASOUND/IVUS N/A 11/19/2020   Procedure: Intravascular Ultrasound/IVUS;  Surgeon: Mady Bruckner, MD;  Location: [facility];  Service: Cardiovascular;  Laterality: N/A;   ENDOVEIN HARVEST OF GREATER SAPHENOUS VEIN Right 12/24/2021   Procedure: ENDOVEIN HARVEST OF GREATER SAPHENOUS VEIN;  Surgeon: Shyrl Linnie KIDD, MD;  Location: [facility] OR;  Service: Open Heart Surgery;  Laterality: Right;   FRACTURE SURGERY     IR THORACENTESIS ASP PLEURAL SPACE W/IMG GUIDE  01/31/2022   LEFT HEART CATH AND CORONARY ANGIOGRAPHY N/A 11/19/2020   Procedure:  LEFT HEART CATH AND CORONARY ANGIOGRAPHY;  Surgeon: Mady Bruckner, MD;  Location: [facility];  Service: Cardiovascular;  Laterality: N/A;   LEFT HEART CATH AND CORONARY ANGIOGRAPHY N/A 12/24/2020   Procedure: LEFT HEART CATH AND CORONARY ANGIOGRAPHY;  Surgeon: Swaziland, Peter M, MD;  Location: [facility] INVASIVE CV LAB;  Service: Cardiovascular;  Laterality: N/A;   LEFT HEART CATH AND CORONARY ANGIOGRAPHY N/A 12/16/2021   Procedure: LEFT HEART CATH AND CORONARY ANGIOGRAPHY;  Surgeon: Anner Alm ORN, MD;  Location: [facility] INVASIVE CV LAB;  Service: Cardiovascular;  Laterality: N/A;   LEFT HEART CATH AND CORS/GRAFTS ANGIOGRAPHY N/A 03/18/2023   Procedure: LEFT HEART CATH AND CORS/GRAFTS ANGIOGRAPHY;  Surgeon: Dann Candyce RAMAN, MD;  Location: [facility] INVASIVE CV LAB;  Service: Cardiovascular;  Laterality: N/A;    LUMBAR LAMINECTOMY     SHOULDER ARTHROSCOPY WITH ROTATOR CUFF REPAIR AND SUBACROMIAL DECOMPRESSION Right 11/23/2018   Procedure: SHOULDER ARTHROSCOPY WITH ROTATOR CUFF REPAIR AND SUBACROMIAL DECOMPRESSION;  Surgeon: Josefina Chew, MD;  Location: [facility];  Service: Orthopedics;  Laterality: Right;   SPINE SURGERY     TEE WITHOUT CARDIOVERSION N/A 12/24/2021   Procedure: TRANSESOPHAGEAL ECHOCARDIOGRAM (TEE);  Surgeon: Shyrl Linnie KIDD, MD;  Location: [facility] OR;  Service: Open Heart Surgery;  Laterality: N/A;   Social History   Tobacco Use   Smoking status: Former    Current packs/day: 0.00    Types: Cigarettes    Start date: 12/29/1969    Quit date: 12/29/1985    Years since quitting: 38.5   Smokeless tobacco: Never  Vaping Use   Vaping status: Never Used  Substance Use Topics   Alcohol use: Not Currently   Drug use: No   Social History   Socioeconomic History   Marital status: Married    Spouse name: Shanda   Number of children: 2   Years of education: Not on file   Highest education level: GED or equivalent  Occupational History   Occupation: self employed    Associate Professor: UNEMPLOYED  Tobacco Use   Smoking status: Former    Current packs/day: 0.00    Types: Cigarettes    Start date: 12/29/1969    Quit date: 12/29/1985    Years since quitting: 38.5   Smokeless tobacco: Never  Vaping Use   Vaping status: Never Used  Substance and Sexual Activity   Alcohol use: Not Currently   Drug use: No   Sexual activity: Yes    Partners: Female  Other Topics Concern   Not on file  Social History Narrative   Exercise-- 3 days   Pt has hs degree   Right handed   One story home   Drinks caffeine 2 cups in am   Are you right handed or left handed?    Are you currently employed ?    What is your current occupation? retired   Do you live at home alone?   Who lives with you?  wife   What type of home do you live in: 1 story or 2 story?        Social Drivers of Health    Financial Resource Strain: High Risk (12/15/2023)   Overall Financial Resource Strain (CARDIA)    Difficulty of Paying Living Expenses: Hard  Food Insecurity: Food Insecurity Present (12/15/2023)   Hunger Vital Sign    Worried About Running Out of Food in the Last Year: Sometimes true    Ran Out of Food in the Last Year: Sometimes true  Transportation  Needs: No Transportation Needs (12/15/2023)   PRAPARE - Administrator, Civil Service (Medical): No    Lack of Transportation (Non-Medical): No  Physical Activity: Insufficiently Active (12/15/2023)   Exercise Vital Sign    Days of Exercise per Week: 2 days    Minutes of Exercise per Session: 20 min  Stress: No Stress Concern Present (12/15/2023)   [employer] of Occupational Health - Occupational Stress Questionnaire    Feeling of Stress : Only a little  Social Connections: Unknown (12/15/2023)   Social Connection and Isolation Panel    Frequency of Communication with Friends and Family: Three times a week    Frequency of Social Gatherings with Friends and Family: Three times a week    Attends Religious Services: More than 4 times per year    Active Member of Clubs or Organizations: Patient declined    Attends Banker Meetings: Not on file    Marital Status: Married  Intimate Partner Violence: Not At Risk (03/17/2023)   Humiliation, Afraid, Rape, and Kick questionnaire    Fear of Current or Ex-Partner: No    Emotionally Abused: No    Physically Abused: No    Sexually Abused: No   Family Status  Relation Name Status   Mother Charli Liberatore Deceased   Father Chael Tyson Deceased   Sister debbie Deceased at age 6   Sister vicki Alive   Sister lois Deceased   MGM  (Not Specified)   Other  (Not Specified)   Other  (Not Specified)   Other  (Not Specified)   Other  (Not Specified)   Other  (Not Specified)   Other Dawn (Not Specified)   Other Technical brewer (Not Specified)   Other neg hx (Not Specified)    Neg Hx  (Not Specified)  No partnership data on file   Family History  Problem Relation Age of Onset   COPD Mother    Stroke Father    Ovarian cancer Sister    Stomach cancer Sister    Heart disease Sister        MI   Heart disease Sister        MI   Stomach cancer Maternal Grandmother    Alcohol abuse Other    Depression Other    Arthritis Other    Hypertension Other    Coronary artery disease Other    Ovarian cancer Other        neice   Ovarian cancer Other        neice   Colon polyps Neg Hx    Colon cancer Neg Hx    Allergies  Allergen Reactions   Niacin Anaphylaxis    Flushing - required ED visit      Review of Systems  Constitutional:  Negative for fever and malaise/fatigue.  HENT:  Negative for congestion.   Eyes:  Negative for blurred vision.  Respiratory:  Negative for cough and shortness of breath.   Cardiovascular:  Negative for chest pain, palpitations and leg swelling.  Gastrointestinal:  Negative for abdominal pain, blood in stool, nausea and vomiting.  Genitourinary:  Negative for dysuria and frequency.  Musculoskeletal:  Positive for back pain and neck pain. Negative for falls.  Skin:  Negative for rash.  Neurological:  Positive for weakness. Negative for dizziness, loss of consciousness and headaches.  Endo/Heme/Allergies:  Negative for environmental allergies.  Psychiatric/Behavioral:  Negative for depression. The patient is not nervous/anxious.       Objective:  BP 118/60 (BP Location: Left Arm, Patient Position: Sitting, Cuff Size: Large)   Pulse 60   Temp 97.8 F (36.6 C) (Oral)   Resp 18   Ht 5' 11 (1.803 m)   Wt 199 lb (90.3 kg)   SpO2 98%   BMI 27.75 kg/m  BP Readings from Last 3 Encounters:  06/28/24 118/60  06/01/24 110/60  03/31/24 106/67   Wt Readings from Last 3 Encounters:  06/28/24 199 lb (90.3 kg)  06/01/24 195 lb (88.5 kg)  03/31/24 197 lb 12.8 oz (89.7 kg)   SpO2 Readings from Last 3 Encounters:  06/28/24  98%  03/31/24 98%  03/23/24 98%      Physical Exam Vitals and nursing note reviewed.  Constitutional:      General: He is not in acute distress.    Appearance: Normal appearance. He is well-developed.  HENT:     Head: Normocephalic and atraumatic.   Eyes:     General: No scleral icterus.       Right eye: No discharge.        Left eye: No discharge.    Cardiovascular:     Rate and Rhythm: Normal rate and regular rhythm.     Heart sounds: No murmur heard. Pulmonary:     Effort: Pulmonary effort is normal. No respiratory distress.     Breath sounds: Normal breath sounds.   Musculoskeletal:        General: Tenderness present. Normal range of motion.     Cervical back: Normal range of motion and neck supple.     Right lower leg: No edema.     Left lower leg: No edema.   Skin:    General: Skin is warm and dry.   Neurological:     Mental Status: He is alert and oriented to person, place, and time.     Motor: Weakness present.     Gait: Gait abnormal.     Deep Tendon Reflexes: Reflexes normal.     Comments: 2/5 strength r low ext 3/5 in L low ext Upper ext 3/5 weakness b/L Pt is having balance problems but is refusing to use walker / cane  Psychiatric:        Mood and Affect: Mood normal.        Behavior: Behavior normal.        Thought Content: Thought content normal.        Judgment: Judgment normal.      No results found for any visits on 06/28/24.  Last CBC Lab Results  Component Value Date   WBC 7.2 01/19/2024   HGB 17.4 (H) 01/19/2024   HCT 51.9 (H) 01/19/2024   MCV 84.3 01/19/2024   MCH 28.2 01/19/2024   RDW 12.4 01/19/2024   PLT 181 01/19/2024   Last metabolic panel Lab Results  Component Value Date   GLUCOSE 128 (H) 01/19/2024   NA 137 01/19/2024   K 4.3 01/19/2024   CL 99 01/19/2024   CO2 28 01/19/2024   BUN 36 (H) 01/19/2024   CREATININE 0.91 01/19/2024   GFR 85.50 12/15/2023   CALCIUM  10.0 01/19/2024   PHOS 4.0 01/25/2022   PROT 7.6  01/19/2024   ALBUMIN  4.5 12/15/2023   LABGLOB 2.5 02/18/2022   AGRATIO 1.9 02/18/2022   BILITOT 0.8 01/19/2024   ALKPHOS 94 12/15/2023   AST 17 01/19/2024   ALT 17 01/19/2024   ANIONGAP 8 03/17/2023   Last lipids Lab Results  Component Value Date   CHOL 222 (  H) 01/19/2024   HDL 43 01/19/2024   LDLCALC 148 (H) 01/19/2024   LDLDIRECT 93.0 05/31/2020   TRIG 179 (H) 01/19/2024   CHOLHDL 5.2 (H) 01/19/2024   Last hemoglobin A1c Lab Results  Component Value Date   HGBA1C 7.4 (H) 01/19/2024   Last thyroid  functions Lab Results  Component Value Date   TSH 1.22 06/05/2023   Last vitamin D  Lab Results  Component Value Date   VD25OH 37.26 03/30/2023   Last vitamin B12 and Folate Lab Results  Component Value Date   VITAMINB12 482 03/30/2023   FOLATE 10.9 11/05/2010      The 10-year ASCVD risk score (Arnett DK, et al., 2019) is: 34%    Assessment & Plan:   Problem List Items Addressed This Visit       Unprioritized   Bulge of lumbar disc without myelopathy   Relevant Medications   oxyCODONE -acetaminophen  (PERCOCET/ROXICET) 5-325 MG tablet   Other Relevant Orders   MR Lumbar Spine Wo Contrast   Ambulatory referral to Neurosurgery   Weakness of lower extremity - Primary   Relevant Medications   oxyCODONE -acetaminophen  (PERCOCET/ROXICET) 5-325 MG tablet   Other Relevant Orders   MR Lumbar Spine Wo Contrast   Ambulatory referral to Neurosurgery   Other Visit Diagnoses       Cervical stenosis of spine       Relevant Medications   oxyCODONE -acetaminophen  (PERCOCET/ROXICET) 5-325 MG tablet   Other Relevant Orders   MR Cervical Spine Wo Contrast   Ambulatory referral to Neurosurgery     Assessment and Plan Assessment & Plan Chronic Neck and Back Pain with Stenosis   He experiences worsening chronic neck and back pain with leg weakness, hip pain, numbness in hands, and arm weakness. Severe pain is not managed with hydrocodone , and spinal stenosis is suspected  to contribute to symptoms. There is significant functional impairment with difficulty standing and walking, increasing fall risk. Oxycodone  is preferred for pain management based on past effectiveness, and he is awaiting a new non-addictive pain medication. Order an MRI of the cervical and lumbar spine. Refer to neurosurgery for evaluation and potential surgical intervention. Prescribe oxycodone  every four hours for pain management.  Diabetes Mellitus   Ongoing management involves issues related to diabetic shoes and inserts. There was no discussion of blood glucose control or complications in this encounter. Ensure follow-up on diabetic shoes and inserts.    No follow-ups on file.    Suzannah Bettes R Lowne Chase, DO

## 2024-06-28 NOTE — Telephone Encounter (Signed)
 Copied from CRM [id number]. Topic: General - Other >> Jun 28, 2024  4:59 PM Kevelyn M wrote: Reason for CRM: Patient needs a letter to evade [employer] duty for July 10th for medical reasons. Please advise [phone]

## 2024-06-29 NOTE — Telephone Encounter (Signed)
 Spoke with patient. [employer] number is [id number] and date is 07/07/24

## 2024-06-30 ENCOUNTER — Encounter: Payer: Self-pay | Admitting: Family Medicine

## 2024-07-04 ENCOUNTER — Other Ambulatory Visit (HOSPITAL_BASED_OUTPATIENT_CLINIC_OR_DEPARTMENT_OTHER): Payer: Self-pay

## 2024-07-05 ENCOUNTER — Other Ambulatory Visit: Payer: Self-pay | Admitting: Family Medicine

## 2024-07-05 ENCOUNTER — Other Ambulatory Visit: Payer: Self-pay

## 2024-07-05 ENCOUNTER — Other Ambulatory Visit (HOSPITAL_BASED_OUTPATIENT_CLINIC_OR_DEPARTMENT_OTHER): Payer: Self-pay

## 2024-07-05 ENCOUNTER — Other Ambulatory Visit: Payer: Self-pay | Admitting: Cardiovascular Disease

## 2024-07-05 DIAGNOSIS — R110 Nausea: Secondary | ICD-10-CM

## 2024-07-05 MED ORDER — ONDANSETRON HCL 4 MG PO TABS
4.0000 mg | ORAL_TABLET | Freq: Three times a day (TID) | ORAL | 2 refills | Status: AC | PRN
  Filled 2024-07-05: qty 20, 7d supply, fill #0
  Filled 2024-10-25: qty 20, 7d supply, fill #1
  Filled 2025-01-13: qty 20, 7d supply, fill #2

## 2024-07-05 MED ORDER — MOUNJARO 7.5 MG/0.5ML ~~LOC~~ SOAJ
7.5000 mg | SUBCUTANEOUS | 0 refills | Status: DC
  Filled 2024-07-05: qty 2, 28d supply, fill #0

## 2024-07-05 NOTE — Addendum Note (Signed)
 Addended by: DARRELL BRUCKNER on: 07/05/2024 04:12 PM   Modules accepted: Orders

## 2024-07-06 ENCOUNTER — Ambulatory Visit (HOSPITAL_BASED_OUTPATIENT_CLINIC_OR_DEPARTMENT_OTHER)
Admission: RE | Admit: 2024-07-06 | Discharge: 2024-07-06 | Disposition: A | Discharge status: Home / Self Care | Source: Ambulatory Visit | Attending: Family Medicine | Admitting: Family Medicine

## 2024-07-06 DIAGNOSIS — M4802 Spinal stenosis, cervical region: Secondary | ICD-10-CM | POA: Insufficient documentation

## 2024-07-06 DIAGNOSIS — D1809 Hemangioma of other sites: Secondary | ICD-10-CM | POA: Diagnosis not present

## 2024-07-06 DIAGNOSIS — M51369 Other intervertebral disc degeneration, lumbar region without mention of lumbar back pain or lower extremity pain: Secondary | ICD-10-CM | POA: Insufficient documentation

## 2024-07-06 DIAGNOSIS — M5117 Intervertebral disc disorders with radiculopathy, lumbosacral region: Secondary | ICD-10-CM | POA: Diagnosis not present

## 2024-07-06 DIAGNOSIS — R29898 Other symptoms and signs involving the musculoskeletal system: Secondary | ICD-10-CM | POA: Insufficient documentation

## 2024-07-06 DIAGNOSIS — M542 Cervicalgia: Secondary | ICD-10-CM | POA: Diagnosis not present

## 2024-07-06 DIAGNOSIS — M5116 Intervertebral disc disorders with radiculopathy, lumbar region: Secondary | ICD-10-CM | POA: Diagnosis not present

## 2024-07-06 DIAGNOSIS — M47812 Spondylosis without myelopathy or radiculopathy, cervical region: Secondary | ICD-10-CM | POA: Diagnosis not present

## 2024-07-06 DIAGNOSIS — M48061 Spinal stenosis, lumbar region without neurogenic claudication: Secondary | ICD-10-CM | POA: Diagnosis not present

## 2024-07-06 DIAGNOSIS — M4726 Other spondylosis with radiculopathy, lumbar region: Secondary | ICD-10-CM | POA: Diagnosis not present

## 2024-07-07 ENCOUNTER — Ambulatory Visit: Payer: Self-pay | Admitting: Family Medicine

## 2024-07-14 ENCOUNTER — Other Ambulatory Visit (HOSPITAL_BASED_OUTPATIENT_CLINIC_OR_DEPARTMENT_OTHER): Payer: Self-pay

## 2024-07-19 ENCOUNTER — Encounter: Payer: Self-pay | Admitting: Physician Assistant

## 2024-07-19 ENCOUNTER — Ambulatory Visit: Payer: Self-pay | Admitting: Physician Assistant

## 2024-07-19 ENCOUNTER — Ambulatory Visit (INDEPENDENT_AMBULATORY_CARE_PROVIDER_SITE_OTHER): Admitting: Physician Assistant

## 2024-07-19 VITALS — BP 108/64 | HR 60 | Ht 71.0 in | Wt 198.0 lb

## 2024-07-19 DIAGNOSIS — R079 Chest pain, unspecified: Secondary | ICD-10-CM

## 2024-07-19 DIAGNOSIS — R42 Dizziness and giddiness: Secondary | ICD-10-CM

## 2024-07-19 LAB — CBC WITH DIFFERENTIAL/PLATELET
Basophils Absolute: 0 K/uL (ref 0.0–0.1)
Basophils Relative: 0.6 % (ref 0.0–3.0)
Eosinophils Absolute: 0.1 K/uL (ref 0.0–0.7)
Eosinophils Relative: 1.6 % (ref 0.0–5.0)
HCT: 46.1 % (ref 39.0–52.0)
Hemoglobin: 15.3 g/dL (ref 13.0–17.0)
Lymphocytes Relative: 21.7 % (ref 12.0–46.0)
Lymphs Abs: 1.5 K/uL (ref 0.7–4.0)
MCHC: 33.3 g/dL (ref 30.0–36.0)
MCV: 84.8 fl (ref 78.0–100.0)
Monocytes Absolute: 0.4 K/uL (ref 0.1–1.0)
Monocytes Relative: 6.3 % (ref 3.0–12.0)
Neutro Abs: 4.9 K/uL (ref 1.4–7.7)
Neutrophils Relative %: 69.8 % (ref 43.0–77.0)
Platelets: 180 K/uL (ref 150.0–400.0)
RBC: 5.43 Mil/uL (ref 4.22–5.81)
RDW: 13.3 % (ref 11.5–15.5)
WBC: 7 K/uL (ref 4.0–10.5)

## 2024-07-19 LAB — COMPREHENSIVE METABOLIC PANEL WITH GFR
ALT: 27 U/L (ref 0–53)
AST: 24 U/L (ref 0–37)
Albumin: 4.5 g/dL (ref 3.5–5.2)
Alkaline Phosphatase: 77 U/L (ref 39–117)
BUN: 24 mg/dL — ABNORMAL HIGH (ref 6–23)
CO2: 30 meq/L (ref 19–32)
Calcium: 9.8 mg/dL (ref 8.4–10.5)
Chloride: 102 meq/L (ref 96–112)
Creatinine, Ser: 0.92 mg/dL (ref 0.40–1.50)
GFR: 85.14 mL/min (ref >60.00)
Glucose, Bld: 122 mg/dL — ABNORMAL HIGH (ref 70–99)
Potassium: 4.6 meq/L (ref 3.5–5.1)
Sodium: 138 meq/L (ref 135–145)
Total Bilirubin: 0.6 mg/dL (ref 0.2–1.2)
Total Protein: 7.3 g/dL (ref 6.0–8.3)

## 2024-07-19 LAB — HEMOGLOBIN A1C: Hgb A1c MFr Bld: 6.4 % (ref 4.6–6.5)

## 2024-07-19 LAB — GLUCOSE, POCT (MANUAL RESULT ENTRY): POC Glucose: 176 mg/dL — AB (ref 70–99)

## 2024-07-19 NOTE — Progress Notes (Signed)
 Established patient visit   Patient: Ronald Petersen   DOB: 07-29-1956   68 y.o. Male  MRN: [id number] Visit Date: 07/19/2024  Today's healthcare provider: Manuelita Flatness, PA-C   Cc. Dizziness, fatigue, pre-syncope, chest pressure  Subjective     Discussed the use of AI scribe software for clinical note transcription with the patient, who gave verbal consent to proceed.  History of Present Illness   Ronald Petersen is a 69 year old male with pmh of HTN, CAD, angina, uncontrolled DM, OSA, who presents with dizziness and chest discomfort.  Dizziness and a general feeling of weakness have been present for about a week, worsening with activity. An episode of near syncope occurred last Thursday after getting off a lawnmower. He did not check his BS or BP at this time.   Chest pressure occurs at any time and is not linked to specific activities. Shortness of breath is more pronounced outdoors and is more frequent than usual.  He manages diabetes with Jardiance  and Mounjaro , with a recent dose increase of Mounjaro . He has not taken the Mounjaro  in two weeks, as he was concerned that was a cause of his symptoms. Denies seeing any BS < 100 but only checks fasting sugars and occasionally after eating. He does not consistently take his insulin .   Notably, pt feels well today. Denies current symptoms of dizziness, chest pain.     Medications: Outpatient Medications Prior to Visit  Medication Sig   acetaminophen  (TYLENOL ) 650 MG CR tablet Take 650 mg by mouth every 8 (eight) hours as needed for pain.   aspirin  EC 325 MG EC tablet Take 1 tablet (325 mg total) by mouth daily.   Bempedoic Acid -Ezetimibe  (NEXLIZET ) 180-10 MG TABS Take 1 tablet by mouth daily. (Replaces ezetimibe  10mg )   ELDERBERRY PO Take 300 mg by mouth daily.   empagliflozin  (JARDIANCE ) 25 MG TABS tablet Take 1 tablet (25 mg total) by mouth daily before breakfast. Needs appt   famotidine  (PEPCID ) 40 MG tablet Take  1 tablet (40 mg total) by mouth at bedtime.   fenofibrate  160 MG tablet Take 1 tablet (160 mg total) by mouth daily. Needs appt   gabapentin  (NEURONTIN ) 300 MG capsule Take 3 capsules (900 mg total) by mouth 3 (three) times daily.   glucose blood (ONETOUCH VERIO) test strip USE AS INSTRUCTED TO CHECK BLOOD SUGAR ONCE A DAY   insulin  degludec (TRESIBA  FLEXTOUCH) 100 UNIT/ML FlexTouch Pen Inject 10 Units into the skin daily   Insulin  Pen Needle (TRUEPLUS 5-BEVEL PEN NEEDLES) 31G X 5 MM MISC Use to inject insulin  as directed.   Magnesium  Citrate 200 MG TABS Take 400 mg by mouth daily.   nitroGLYCERIN  (NITROSTAT ) 0.4 MG SL tablet Place 1 tablet (0.4 mg total) under the tongue every 5 (five) minutes as needed for chest pain.   ondansetron  (ZOFRAN ) 4 MG tablet Take 1 tablet (4 mg total) by mouth every 8 (eight) hours as needed.   oxyCODONE -acetaminophen  (PERCOCET/ROXICET) 5-325 MG tablet Take 1 tablet by mouth every 4 (four) hours as needed for severe pain (pain score 7-10).   pantoprazole  (PROTONIX ) 40 MG tablet Take 1 tablet (40 mg total) by mouth 2 (two) times daily.   ranolazine  (RANEXA ) 500 MG 12 hr tablet Take 1 tablet (500 mg total) by mouth 2 (two) times daily.   sertraline  (ZOLOFT ) 100 MG tablet Take 1.5 tablets (150 mg total) by mouth daily.   tirzepatide  (MOUNJARO ) 7.5 MG/0.5ML Pen Inject 7.5  mg into the skin once a week.   tiZANidine  (ZANAFLEX ) 4 MG tablet Take 1 tablet (4 mg total) by mouth every 6 (six) hours as needed for muscle spasms.   traZODone  (DESYREL ) 50 MG tablet Take 1 tablet (50 mg total) by mouth at bedtime as needed for sleep. Needs appt   umeclidinium-vilanterol (ANORO ELLIPTA ) 62.5-25 MCG/ACT AEPB Inhale 1 puff into the lungs daily.   vitamin E  180 MG (400 UNITS) capsule Take 1,600 Units by mouth daily.   No facility-administered medications prior to visit.    Review of Systems  Constitutional:  Positive for fatigue. Negative for fever.  Respiratory:  Negative for cough  and shortness of breath.   Cardiovascular:  Positive for chest pain. Negative for palpitations and leg swelling.  Neurological:  Negative for dizziness and headaches.       Objective    BP 108/64   Pulse 60   Ht 5' 11 (1.803 m)   Wt 198 lb (89.8 kg)   SpO2 99%   BMI 27.62 kg/m    Physical Exam Constitutional:      General: He is awake.     Appearance: He is well-developed.  HENT:     Head: Normocephalic.  Eyes:     Conjunctiva/sclera: Conjunctivae normal.  Cardiovascular:     Rate and Rhythm: Normal rate and regular rhythm.     Heart sounds: Normal heart sounds.  Pulmonary:     Effort: Pulmonary effort is normal.     Breath sounds: Normal breath sounds.  Skin:    General: Skin is warm.  Neurological:     Mental Status: He is alert and oriented to person, place, and time.  Psychiatric:        Attention and Perception: Attention normal.        Mood and Affect: Mood normal.        Speech: Speech normal.        Behavior: Behavior is cooperative.      Results for orders placed or performed in visit on 07/19/24  CBC w/Diff  Result Value Ref Range   WBC 7.0 4.0 - 10.5 K/uL   RBC 5.43 4.22 - 5.81 Mil/uL   Hemoglobin 15.3 13.0 - 17.0 g/dL   HCT 53.8 60.9 - 47.9 %   MCV 84.8 78.0 - 100.0 fl   MCHC 33.3 30.0 - 36.0 g/dL   RDW 86.6 88.4 - 84.4 %   Platelets 180.0 150.0 - 400.0 K/uL   Neutrophils Relative % 69.8 43.0 - 77.0 %   Lymphocytes Relative 21.7 12.0 - 46.0 %   Monocytes Relative 6.3 3.0 - 12.0 %   Eosinophils Relative 1.6 0.0 - 5.0 %   Basophils Relative 0.6 0.0 - 3.0 %   Neutro Abs 4.9 1.4 - 7.7 K/uL   Lymphs Abs 1.5 0.7 - 4.0 K/uL   Monocytes Absolute 0.4 0.1 - 1.0 K/uL   Eosinophils Absolute 0.1 0.0 - 0.7 K/uL   Basophils Absolute 0.0 0.0 - 0.1 K/uL  HgB A1c  Result Value Ref Range   Hgb A1c MFr Bld 6.4 4.6 - 6.5 %  Comp Met (CMET)  Result Value Ref Range   Sodium 138 135 - 145 mEq/L   Potassium 4.6 3.5 - 5.1 mEq/L   Chloride 102 96 - 112 mEq/L    CO2 30 19 - 32 mEq/L   Glucose, Bld 122 (H) 70 - 99 mg/dL   BUN 24 (H) 6 - 23 mg/dL   Creatinine, Ser 9.07 0.40 -  1.50 mg/dL   Total Bilirubin 0.6 0.2 - 1.2 mg/dL   Alkaline Phosphatase 77 39 - 117 U/L   AST 24 0 - 37 U/L   ALT 27 0 - 53 U/L   Total Protein 7.3 6.0 - 8.3 g/dL   Albumin  4.5 3.5 - 5.2 g/dL   GFR 14.85 >39.99 mL/min   Calcium  9.8 8.4 - 10.5 mg/dL  POCT Glucose (CBG)  Result Value Ref Range   POC Glucose 176 (A) 70 - 99 mg/dl    Assessment & Plan    Chest pain, unspecified type -     EKG 12-Lead  Dizziness -     CBC with Differential/Platelet -     Hemoglobin A1c -     Comprehensive metabolic panel with GFR -     POCT glucose (manual entry)  EKG normal sinus, appears stable compared to 01/19/24 Poc BS today 176 BP soft but not hypotensive today.  Symptoms could be consistent w/ hypoglycemic or hypotensive episodes, h/o of CAD complicating etiology.  Check labs today  Recommend pt f/u with cardiology asap. Recommend checking bs and bp during these episodes. Advise pt take medication as prescribed-- but given he has been off Mounjaro  for several weeks, I do not recommend re-starting until symptoms resolve. Re-starting 7.5 mg weekly after being off of it will likely cause N/V and would worsen his current symptoms.  - Instruct to go to the ER if symptoms worsen or another severe episode occurs.   Return if symptoms worsen or fail to improve.       Manuelita Flatness, PA-C  [facility] [phone] (phone) [phone] (fax)  [facility]

## 2024-07-20 ENCOUNTER — Other Ambulatory Visit (HOSPITAL_BASED_OUTPATIENT_CLINIC_OR_DEPARTMENT_OTHER): Payer: Self-pay

## 2024-07-21 ENCOUNTER — Other Ambulatory Visit (HOSPITAL_BASED_OUTPATIENT_CLINIC_OR_DEPARTMENT_OTHER): Payer: Self-pay

## 2024-07-21 ENCOUNTER — Other Ambulatory Visit: Payer: Self-pay | Admitting: Family Medicine

## 2024-07-21 ENCOUNTER — Other Ambulatory Visit: Payer: Self-pay

## 2024-07-21 ENCOUNTER — Other Ambulatory Visit: Payer: Self-pay | Admitting: Pulmonary Disease

## 2024-07-21 DIAGNOSIS — M4802 Spinal stenosis, cervical region: Secondary | ICD-10-CM

## 2024-07-21 DIAGNOSIS — E119 Type 2 diabetes mellitus without complications: Secondary | ICD-10-CM

## 2024-07-21 DIAGNOSIS — M51369 Other intervertebral disc degeneration, lumbar region without mention of lumbar back pain or lower extremity pain: Secondary | ICD-10-CM

## 2024-07-21 DIAGNOSIS — G8929 Other chronic pain: Secondary | ICD-10-CM

## 2024-07-21 DIAGNOSIS — E1169 Type 2 diabetes mellitus with other specified complication: Secondary | ICD-10-CM

## 2024-07-21 DIAGNOSIS — R29898 Other symptoms and signs involving the musculoskeletal system: Secondary | ICD-10-CM

## 2024-07-21 MED ORDER — EMPAGLIFLOZIN 25 MG PO TABS
25.0000 mg | ORAL_TABLET | Freq: Every day | ORAL | 0 refills | Status: DC
  Filled 2024-07-21: qty 90, 90d supply, fill #0

## 2024-07-21 MED ORDER — FENOFIBRATE 160 MG PO TABS
160.0000 mg | ORAL_TABLET | Freq: Every day | ORAL | 0 refills | Status: DC
  Filled 2024-07-21: qty 90, 90d supply, fill #0

## 2024-07-21 MED ORDER — TIZANIDINE HCL 4 MG PO TABS
4.0000 mg | ORAL_TABLET | Freq: Four times a day (QID) | ORAL | 2 refills | Status: DC | PRN
  Filled 2024-07-21: qty 9, 3d supply, fill #0
  Filled 2024-07-21: qty 30, 8d supply, fill #0
  Filled 2024-07-21: qty 21, 5d supply, fill #0
  Filled 2024-08-11: qty 30, 8d supply, fill #1
  Filled 2024-09-06: qty 30, 8d supply, fill #2

## 2024-07-21 MED ORDER — OXYCODONE-ACETAMINOPHEN 5-325 MG PO TABS
1.0000 | ORAL_TABLET | ORAL | 0 refills | Status: DC | PRN
  Filled 2024-07-21 – 2024-07-26 (×2): qty 180, 30d supply, fill #0

## 2024-07-21 MED ORDER — TRESIBA FLEXTOUCH 100 UNIT/ML ~~LOC~~ SOPN
10.0000 [IU] | PEN_INJECTOR | Freq: Every day | SUBCUTANEOUS | 2 refills | Status: DC
  Filled 2024-07-21: qty 3, 30d supply, fill #0
  Filled 2024-08-16: qty 3, 30d supply, fill #1
  Filled 2024-09-14: qty 3, 30d supply, fill #2

## 2024-07-21 MED ORDER — UMECLIDINIUM-VILANTEROL 62.5-25 MCG/ACT IN AEPB
1.0000 | INHALATION_SPRAY | Freq: Every day | RESPIRATORY_TRACT | 1 refills | Status: DC
  Filled 2024-07-21: qty 60, 30d supply, fill #0
  Filled 2024-09-05: qty 60, 30d supply, fill #1

## 2024-07-21 NOTE — Telephone Encounter (Signed)
 Requesting: oxycodone  and tizanidine   Contract:07/09/23 UDS: 06/05/23 Last Visit: 06/28/24 Next Visit: None Last Refill: see med list   Please Advise

## 2024-07-26 ENCOUNTER — Other Ambulatory Visit: Payer: Self-pay

## 2024-07-26 ENCOUNTER — Other Ambulatory Visit (HOSPITAL_BASED_OUTPATIENT_CLINIC_OR_DEPARTMENT_OTHER): Payer: Self-pay

## 2024-07-26 ENCOUNTER — Telehealth: Payer: Self-pay | Admitting: Podiatry

## 2024-07-26 NOTE — Telephone Encounter (Signed)
 Patient contacted [facility] and they have not received referral from Dr. Sikora for patient diabetic shoes. Patient stated it's been a month since his last visit with Dr. Joya regarding diabetiic shoes

## 2024-08-02 ENCOUNTER — Other Ambulatory Visit (HOSPITAL_BASED_OUTPATIENT_CLINIC_OR_DEPARTMENT_OTHER): Payer: Self-pay

## 2024-08-02 ENCOUNTER — Other Ambulatory Visit: Payer: Self-pay | Admitting: Cardiology

## 2024-08-02 DIAGNOSIS — E11649 Type 2 diabetes mellitus with hypoglycemia without coma: Secondary | ICD-10-CM

## 2024-08-02 MED ORDER — MOUNJARO 7.5 MG/0.5ML ~~LOC~~ SOAJ
7.5000 mg | SUBCUTANEOUS | 0 refills | Status: DC
  Filled 2024-08-02: qty 2, 28d supply, fill #0

## 2024-08-11 ENCOUNTER — Other Ambulatory Visit (HOSPITAL_BASED_OUTPATIENT_CLINIC_OR_DEPARTMENT_OTHER): Payer: Self-pay

## 2024-08-15 ENCOUNTER — Other Ambulatory Visit (HOSPITAL_BASED_OUTPATIENT_CLINIC_OR_DEPARTMENT_OTHER): Payer: Self-pay

## 2024-08-15 ENCOUNTER — Other Ambulatory Visit: Payer: Self-pay | Admitting: Cardiology

## 2024-08-15 ENCOUNTER — Other Ambulatory Visit: Payer: Self-pay

## 2024-08-15 MED ORDER — NITROGLYCERIN 0.4 MG SL SUBL
0.4000 mg | SUBLINGUAL_TABLET | SUBLINGUAL | 3 refills | Status: AC | PRN
  Filled 2025-01-11: qty 75, 75d supply, fill #0

## 2024-08-17 DIAGNOSIS — S73191A Other sprain of right hip, initial encounter: Secondary | ICD-10-CM | POA: Diagnosis not present

## 2024-08-17 DIAGNOSIS — S73192A Other sprain of left hip, initial encounter: Secondary | ICD-10-CM | POA: Diagnosis not present

## 2024-08-17 DIAGNOSIS — M25551 Pain in right hip: Secondary | ICD-10-CM | POA: Diagnosis not present

## 2024-08-17 DIAGNOSIS — S76012A Strain of muscle, fascia and tendon of left hip, initial encounter: Secondary | ICD-10-CM | POA: Diagnosis not present

## 2024-08-19 ENCOUNTER — Other Ambulatory Visit (HOSPITAL_BASED_OUTPATIENT_CLINIC_OR_DEPARTMENT_OTHER): Payer: Self-pay

## 2024-08-19 NOTE — Telephone Encounter (Signed)
 I called the patient to see if he had heard from [place].  He stated he had not heard anything.  I told him I would follow up on it.  Dr. Sikora please advise.  Did we send a prescription for him?

## 2024-08-23 ENCOUNTER — Other Ambulatory Visit: Payer: Self-pay | Admitting: Family

## 2024-08-23 DIAGNOSIS — R29898 Other symptoms and signs involving the musculoskeletal system: Secondary | ICD-10-CM

## 2024-08-23 DIAGNOSIS — M4802 Spinal stenosis, cervical region: Secondary | ICD-10-CM

## 2024-08-23 DIAGNOSIS — M51369 Other intervertebral disc degeneration, lumbar region without mention of lumbar back pain or lower extremity pain: Secondary | ICD-10-CM

## 2024-08-26 NOTE — Telephone Encounter (Signed)
 Pt came in asking for status on refill since he needs it. Informed pt I would route to pcp so she could advise on calling it in.

## 2024-08-26 NOTE — Telephone Encounter (Signed)
 Copied from CRM [id number]. Topic: Clinical - Prescription Issue >> Aug 26, 2024 12:42 PM Shereese L wrote: Reason for CRM: patient stated that medication is due for refill for oxyCODONE -Acetaminophen  (1 tablet Oral Every 4 hours PRN) Medication isn't showing on medication list. Medication is showing pending as of 3 days ago and patient is at the pharmacy awaiting meds

## 2024-08-28 ENCOUNTER — Other Ambulatory Visit (HOSPITAL_BASED_OUTPATIENT_CLINIC_OR_DEPARTMENT_OTHER): Payer: Self-pay

## 2024-08-28 MED ORDER — OXYCODONE-ACETAMINOPHEN 5-325 MG PO TABS
1.0000 | ORAL_TABLET | ORAL | 0 refills | Status: DC | PRN
Start: 2024-08-28 — End: 2024-09-27
  Filled 2024-08-28: qty 180, 30d supply, fill #0

## 2024-08-29 ENCOUNTER — Other Ambulatory Visit (HOSPITAL_BASED_OUTPATIENT_CLINIC_OR_DEPARTMENT_OTHER): Payer: Self-pay

## 2024-08-31 ENCOUNTER — Other Ambulatory Visit: Payer: Self-pay | Admitting: Cardiology

## 2024-08-31 DIAGNOSIS — E11649 Type 2 diabetes mellitus with hypoglycemia without coma: Secondary | ICD-10-CM

## 2024-09-06 ENCOUNTER — Other Ambulatory Visit (HOSPITAL_BASED_OUTPATIENT_CLINIC_OR_DEPARTMENT_OTHER): Payer: Self-pay

## 2024-09-06 DIAGNOSIS — Z6825 Body mass index (BMI) 25.0-25.9, adult: Secondary | ICD-10-CM | POA: Diagnosis not present

## 2024-09-06 DIAGNOSIS — M25551 Pain in right hip: Secondary | ICD-10-CM | POA: Diagnosis not present

## 2024-09-06 DIAGNOSIS — M25552 Pain in left hip: Secondary | ICD-10-CM | POA: Diagnosis not present

## 2024-09-12 ENCOUNTER — Other Ambulatory Visit (HOSPITAL_BASED_OUTPATIENT_CLINIC_OR_DEPARTMENT_OTHER): Payer: Self-pay

## 2024-09-12 ENCOUNTER — Other Ambulatory Visit: Payer: Self-pay | Admitting: Cardiology

## 2024-09-12 DIAGNOSIS — E11649 Type 2 diabetes mellitus with hypoglycemia without coma: Secondary | ICD-10-CM

## 2024-09-12 MED ORDER — MOUNJARO 7.5 MG/0.5ML ~~LOC~~ SOAJ
7.5000 mg | SUBCUTANEOUS | 0 refills | Status: DC
  Filled 2024-09-12: qty 2, 28d supply, fill #0

## 2024-09-14 ENCOUNTER — Other Ambulatory Visit: Payer: Self-pay

## 2024-09-14 ENCOUNTER — Other Ambulatory Visit: Payer: Self-pay | Admitting: Gastroenterology

## 2024-09-15 ENCOUNTER — Other Ambulatory Visit: Payer: Self-pay

## 2024-09-15 ENCOUNTER — Other Ambulatory Visit (HOSPITAL_BASED_OUTPATIENT_CLINIC_OR_DEPARTMENT_OTHER): Payer: Self-pay

## 2024-09-15 DIAGNOSIS — M25552 Pain in left hip: Secondary | ICD-10-CM | POA: Diagnosis not present

## 2024-09-15 DIAGNOSIS — M25551 Pain in right hip: Secondary | ICD-10-CM | POA: Diagnosis not present

## 2024-09-15 MED ORDER — FAMOTIDINE 40 MG PO TABS
40.0000 mg | ORAL_TABLET | Freq: Every day | ORAL | 11 refills | Status: AC
  Filled 2024-09-15: qty 30, 30d supply, fill #0
  Filled 2024-10-13: qty 30, 30d supply, fill #1
  Filled 2024-11-14: qty 30, 30d supply, fill #2
  Filled 2024-12-12: qty 30, 30d supply, fill #3
  Filled 2025-01-11: qty 30, 30d supply, fill #4

## 2024-09-19 ENCOUNTER — Other Ambulatory Visit: Payer: Self-pay

## 2024-09-19 ENCOUNTER — Other Ambulatory Visit (HOSPITAL_BASED_OUTPATIENT_CLINIC_OR_DEPARTMENT_OTHER): Payer: Self-pay

## 2024-09-19 ENCOUNTER — Other Ambulatory Visit: Payer: Self-pay | Admitting: Family Medicine

## 2024-09-19 DIAGNOSIS — F32A Depression, unspecified: Secondary | ICD-10-CM

## 2024-09-19 DIAGNOSIS — G4700 Insomnia, unspecified: Secondary | ICD-10-CM

## 2024-09-19 MED ORDER — SERTRALINE HCL 100 MG PO TABS
150.0000 mg | ORAL_TABLET | Freq: Every day | ORAL | 1 refills | Status: AC
  Filled 2024-09-27: qty 135, 90d supply, fill #0
  Filled 2024-12-26: qty 135, 90d supply, fill #1

## 2024-09-20 ENCOUNTER — Other Ambulatory Visit (HOSPITAL_BASED_OUTPATIENT_CLINIC_OR_DEPARTMENT_OTHER): Payer: Self-pay

## 2024-09-20 MED ORDER — TRAZODONE HCL 50 MG PO TABS
50.0000 mg | ORAL_TABLET | Freq: Every evening | ORAL | 2 refills | Status: DC | PRN
  Filled 2024-09-20: qty 30, 30d supply, fill #0
  Filled 2024-10-13: qty 30, 30d supply, fill #1
  Filled 2024-11-08 – 2024-11-16 (×2): qty 30, 30d supply, fill #2

## 2024-09-21 ENCOUNTER — Ambulatory Visit (INDEPENDENT_AMBULATORY_CARE_PROVIDER_SITE_OTHER)

## 2024-09-21 VITALS — BP 128/70 | Ht 71.0 in | Wt 178.0 lb

## 2024-09-21 DIAGNOSIS — Z0000 Encounter for general adult medical examination without abnormal findings: Secondary | ICD-10-CM | POA: Diagnosis not present

## 2024-09-21 NOTE — Progress Notes (Signed)
 Because this visit was a virtual/telehealth visit,  certain criteria was not obtained, such a blood pressure, CBG if applicable, and timed get up and go. Any medications not marked as taking were not mentioned during the medication reconciliation part of the visit. Any vitals not documented were not able to be obtained due to this being a telehealth visit or patient was unable to self-report a recent blood pressure reading due to a lack of equipment at home via telehealth. Vitals that have been documented are verbally provided by the patient.   This visit was performed by a medical professional under my direct supervision. I was immediately available for consultation/collaboration. I have reviewed and agree with the Annual Wellness Visit documentation.  Subjective:   Ronald Petersen is a 68 y.o. who presents for a Medicare Wellness preventive visit.  As a reminder, Annual Wellness Visits don't include a physical exam, and some assessments may be limited, especially if this visit is performed virtually. We may recommend an in-person follow-up visit with your provider if needed.  Visit Complete: Virtual I connected with  Ronald Petersen on 09/21/24 by a audio enabled telemedicine application and verified that I am speaking with the correct person using two identifiers.  Patient Location: Home  Provider Location: Home Office  I discussed the limitations of evaluation and management by telemedicine. The patient expressed understanding and agreed to proceed.  Vital Signs: Because this visit was a virtual/telehealth visit, some criteria may be missing or patient reported. Any vitals not documented were not able to be obtained and vitals that have been documented are patient reported.  VideoDeclined- This patient declined Librarian, academic. Therefore the visit was completed with audio only.  Persons Participating in Visit: Patient.  AWV Questionnaire: No: Patient  Medicare AWV questionnaire was not completed prior to this visit.  Cardiac Risk Factors include: advanced age (>14men, >73 women);male gender;hypertension;diabetes mellitus;dyslipidemia     Objective:    Today's Vitals   09/21/24 1025  BP: 128/70  Weight: 178 lb (80.7 kg)  Height: 5' 11 (1.803 m)   Body mass index is 24.83 kg/m.     09/21/2024   10:29 AM 07/09/2023    8:55 AM 04/29/2023    3:01 PM 03/17/2023    2:03 PM 01/22/2023   10:33 AM 09/05/2022    9:25 AM 02/04/2022   11:50 AM  Advanced Directives  Does Patient Have a Medical Advance Directive? No No No No No No No  Would patient like information on creating a medical advance directive? No - Patient declined Yes (MAU/Ambulatory/Procedural Areas - Information given) No - Patient declined Yes (ED - Information included in AVS)   Yes (MAU/Ambulatory/Procedural Areas - Information given)    Current Medications (verified) Outpatient Encounter Medications as of 09/21/2024  Medication Sig   acetaminophen  (TYLENOL ) 650 MG CR tablet Take 650 mg by mouth every 8 (eight) hours as needed for pain.   aspirin  EC 325 MG EC tablet Take 1 tablet (325 mg total) by mouth daily.   Bempedoic Acid -Ezetimibe  (NEXLIZET ) 180-10 MG TABS Take 1 tablet by mouth daily. (Replaces ezetimibe  10mg )   ELDERBERRY PO Take 300 mg by mouth daily.   empagliflozin  (JARDIANCE ) 25 MG TABS tablet Take 1 tablet (25 mg total) by mouth daily before breakfast.   famotidine  (PEPCID ) 40 MG tablet Take 1 tablet (40 mg total) by mouth at bedtime.   fenofibrate  160 MG tablet Take 1 tablet (160 mg total) by mouth daily.   gabapentin  (  NEURONTIN ) 300 MG capsule Take 3 capsules (900 mg total) by mouth 3 (three) times daily.   glucose blood (ONETOUCH VERIO) test strip USE AS INSTRUCTED TO CHECK BLOOD SUGAR ONCE A DAY   insulin  degludec (TRESIBA  FLEXTOUCH) 100 UNIT/ML FlexTouch Pen Inject 10 Units into the skin daily   Insulin  Pen Needle (TRUEPLUS 5-BEVEL PEN NEEDLES) 31G X 5 MM MISC  Use to inject insulin  as directed.   Magnesium  Citrate 200 MG TABS Take 400 mg by mouth daily.   nitroGLYCERIN  (NITROSTAT ) 0.4 MG SL tablet Place 1 tablet (0.4 mg total) under the tongue every 5 (five) minutes as needed for chest pain.   ondansetron  (ZOFRAN ) 4 MG tablet Take 1 tablet (4 mg total) by mouth every 8 (eight) hours as needed.   oxyCODONE -acetaminophen  (PERCOCET/ROXICET) 5-325 MG tablet Take 1 tablet by mouth every 4 (four) hours as needed for severe pain (pain score 7-10).   pantoprazole  (PROTONIX ) 40 MG tablet Take 1 tablet (40 mg total) by mouth 2 (two) times daily.   ranolazine  (RANEXA ) 500 MG 12 hr tablet Take 1 tablet (500 mg total) by mouth 2 (two) times daily.   sertraline  (ZOLOFT ) 100 MG tablet Take 1.5 tablets (150 mg total) by mouth daily.   tirzepatide  (MOUNJARO ) 7.5 MG/0.5ML Pen Inject 7.5 mg into the skin once a week.   tiZANidine  (ZANAFLEX ) 4 MG tablet Take 1 tablet (4 mg total) by mouth every 6 (six) hours as needed for muscle spasms.   traZODone  (DESYREL ) 50 MG tablet Take 1 tablet (50 mg total) by mouth at bedtime as needed for sleep.   umeclidinium-vilanterol (ANORO ELLIPTA ) 62.5-25 MCG/ACT AEPB Inhale 1 puff into the lungs daily.   vitamin E  180 MG (400 UNITS) capsule Take 1,600 Units by mouth daily.   No facility-administered encounter medications on file as of 09/21/2024.    Allergies (verified) Niacin   History: Past Medical History:  Diagnosis Date   Allergy    Anxiety    Arthritis    CAD (coronary artery disease)    Cancer (HCC)    CHF (congestive heart failure) (HCC)    Complication of anesthesia    aspirated with back surgery at age 56   Depression    Diabetes mellitus Type II    GERD (gastroesophageal reflux disease)    Hyperlipidemia    Hypertension    Mild aortic stenosis    Mild carotid artery disease    Neuromuscular disorder (HCC)    NEUROPATHY   Plavix  resistance    abnormal cytochrome P450 2c19 genotype in 2021 inferring suboptimal  Plavix  platelet inhibition,   Right rotator cuff tear 11/23/2018   Sleep apnea    no CPAP   Past Surgical History:  Procedure Laterality Date   APPENDECTOMY     ARTHOSCOPIC ROTAOR CUFF REPAIR Right 11/23/2018   Procedure: ARTHROSCOPIC ROTATOR CUFF REPAIR;  Surgeon: Josefina Chew, MD;  Location: [facility];  Service: Orthopedics;  Laterality: Right;   CARDIAC VALVE REPLACEMENT     CHOLECYSTECTOMY     CORONARY ARTERY BYPASS GRAFT N/A 12/24/2021   x2 LIMA to LAD; SVG to Obtuse Marginal   CORONARY STENT INTERVENTION N/A 11/19/2020   Procedure: CORONARY STENT INTERVENTION;  Surgeon: Mady Bruckner, MD;  Location: [facility] INVASIVE CV [facility];  Service: Cardiovascular;  Laterality: N/A;   CORONARY ULTRASOUND/IVUS N/A 11/19/2020   Procedure: Intravascular Ultrasound/IVUS;  Surgeon: Mady Bruckner, MD;  Location: [facility];  Service: Cardiovascular;  Laterality: N/A;   ENDOVEIN HARVEST OF GREATER  SAPHENOUS VEIN Right 12/24/2021   Procedure: ENDOVEIN HARVEST OF GREATER SAPHENOUS VEIN;  Surgeon: Shyrl Linnie KIDD, MD;  Location: [facility] OR;  Service: Open Heart Surgery;  Laterality: Right;   FRACTURE SURGERY     IR THORACENTESIS ASP PLEURAL SPACE W/IMG GUIDE  01/31/2022   LEFT HEART CATH AND CORONARY ANGIOGRAPHY N/A 11/19/2020   Procedure: LEFT HEART CATH AND CORONARY ANGIOGRAPHY;  Surgeon: Mady Bruckner, MD;  Location: [facility];  Service: Cardiovascular;  Laterality: N/A;   LEFT HEART CATH AND CORONARY ANGIOGRAPHY N/A 12/24/2020   Procedure: LEFT HEART CATH AND CORONARY ANGIOGRAPHY;  Surgeon: Swaziland, Peter M, MD;  Location: [facility]VAS[facility] CV LAB;  Service: Cardiovascular;  Laterality: N/A;   LEFT HEART CATH AND CORONARY ANGIOGRAPHY N/A 12/16/2021   Procedure: LEFT HEART CATH AND CORONARY ANGIOGRAPHY;  Surgeon: Anner Alm ORN, MD;  Location: [facility] INVASIVE CV LAB;  Service: Cardiovascular;  Laterality: N/A;   LEFT HEART CATH AND CORS/GRAFTS ANGIOGRAPHY N/A 03/18/2023    Procedure: LEFT HEART CATH AND CORS/GRAFTS ANGIOGRAPHY;  Surgeon: Dann Candyce RAMAN, MD;  Location: [facility] INVASIVE CV LAB;  Service: Cardiovascular;  Laterality: N/A;   LUMBAR LAMINECTOMY     SHOULDER ARTHROSCOPY WITH ROTATOR CUFF REPAIR AND SUBACROMIAL DECOMPRESSION Right 11/23/2018   Procedure: SHOULDER ARTHROSCOPY WITH ROTATOR CUFF REPAIR AND SUBACROMIAL DECOMPRESSION;  Surgeon: Josefina Chew, MD;  Location: [facility];  Service: Orthopedics;  Laterality: Right;   SPINE SURGERY     TEE WITHOUT CARDIOVERSION N/A 12/24/2021   Procedure: TRANSESOPHAGEAL ECHOCARDIOGRAM (TEE);  Surgeon: Shyrl Linnie KIDD, MD;  Location: [facility] OR;  Service: Open Heart Surgery;  Laterality: N/A;   Family History  Problem Relation Age of Onset   COPD Mother    Stroke Father    Ovarian cancer Sister    Stomach cancer Sister    Heart disease Sister        MI   Heart disease Sister        MI   Stomach cancer Maternal Grandmother    Alcohol abuse Other    Depression Other    Arthritis Other    Hypertension Other    Coronary artery disease Other    Ovarian cancer Other        neice   Ovarian cancer Other        neice   Colon polyps Neg Hx    Colon cancer Neg Hx    Social History   Socioeconomic History   Marital status: Married    Spouse name: Shanda   Number of children: 2   Years of education: Not on file   Highest education level: GED or equivalent  Occupational History   Occupation: self employed    Associate Professor: UNEMPLOYED  Tobacco Use   Smoking status: Former    Current packs/day: 0.00    Types: Cigarettes    Start date: 12/29/1969    Quit date: 12/29/1985    Years since quitting: 38.7   Smokeless tobacco: Never  Vaping Use   Vaping status: Never Used  Substance and Sexual Activity   Alcohol use: Not Currently   Drug use: No   Sexual activity: Yes    Partners: Female  Other Topics Concern   Not on file  Social History Narrative   Exercise-- 3 days   Pt has hs degree    Right handed   One story home   Drinks caffeine 2 cups in am   Are you right handed or left handed?  Are you currently employed ?    What is your current occupation? retired   Do you live at home alone?   Who lives with you?  wife   What type of home do you live in: 1 story or 2 story?        Social Drivers of Health   Financial Resource Strain: High Risk (12/15/2023)   Overall Financial Resource Strain (CARDIA)    Difficulty of Paying Living Expenses: Hard  Food Insecurity: Food Insecurity Present (09/21/2024)   Hunger Vital Sign    Worried About Running Out of Food in the Last Year: Sometimes true    Ran Out of Food in the Last Year: Sometimes true  Transportation Needs: No Transportation Needs (09/21/2024)   PRAPARE - Administrator, Civil Service (Medical): No    Lack of Transportation (Non-Medical): No  Physical Activity: Insufficiently Active (09/21/2024)   Exercise Vital Sign    Days of Exercise per Week: 2 days    Minutes of Exercise per Session: 20 min  Stress: No Stress Concern Present (09/21/2024)   [employer] of Occupational Health - Occupational Stress Questionnaire    Feeling of Stress: Only a little  Social Connections: Moderately Integrated (09/21/2024)   Social Connection and Isolation Panel    Frequency of Communication with Friends and Family: Three times a week    Frequency of Social Gatherings with Friends and Family: Three times a week    Attends Religious Services: More than 4 times per year    Active Member of Clubs or Organizations: Patient declined    Attends Banker Meetings: Never    Marital Status: Married    Tobacco Counseling Counseling given: Not Answered    Clinical Intake:  Pre-visit preparation completed: Yes  Pain : No/denies pain     BMI - recorded: 24.83 Nutritional Status: BMI <19  Underweight Nutritional Risks: None Diabetes: No  Lab Results  Component Value Date   HGBA1C 6.4 07/19/2024    HGBA1C 7.4 (H) 01/19/2024   HGBA1C 7.2 (H) 03/30/2023     How often do you need to have someone help you when you read instructions, pamphlets, or other written materials from your doctor or pharmacy?: 1 - Never  Interpreter Needed?: No  Information entered by :: Suprina Mandeville,CMA   Activities of Daily Living     09/21/2024   10:28 AM  In your present state of health, do you have any difficulty performing the following activities:  Hearing? 0  Vision? 0  Difficulty concentrating or making decisions? 0  Walking or climbing stairs? 1  Dressing or bathing? 0  Doing errands, shopping? 0  Preparing Food and eating ? N  Using the Toilet? N  In the past six months, have you accidently leaked urine? N  Do you have problems with loss of bowel control? N  Managing your Medications? N  Managing your Finances? N  Housekeeping or managing your Housekeeping? N    Patient Care Team: Antonio Meth, [name], DO as PCP - General Tobb, Kardie, DO as PCP - Cardiology (Cardiology) Burnard Debby LABOR, MD (Inactive) as PCP - Sleep Medicine (Cardiology) [facility], Donell Cardinal, MD as Consulting Physician (Endocrinology) Brenna Adine CROME, DO as Consulting Physician (Pulmonary Disease) [facility] [place], Venetia CROME, MD as Consulting Physician (Neurology)  I have updated your Care Teams any recent Medical Services you may have received from other providers in the past year.     Assessment:   This  is a routine wellness examination for [place].  Hearing/Vision screen Hearing Screening - Comments:: No difficulties hearing  Vision Screening - Comments:: Patient wears glasses    Goals Addressed             This Visit's Progress    Patient Stated   On track    Drink more water, eat healthier & increase activity       Depression Screen     09/21/2024   10:30 AM 06/05/2023    9:40 AM 04/29/2023    3:09 PM 01/27/2023    3:48 PM 01/19/2023   10:47 AM 12/04/2022    4:23 PM  02/04/2022   11:54 AM  PHQ 2/9 Scores  PHQ - 2 Score 0 0 0 1 2 0 0  PHQ- 9 Score 0 0   12      Fall Risk     09/21/2024   10:29 AM 03/23/2024   11:05 AM 02/03/2024    9:06 AM 06/05/2023    9:41 AM 06/05/2023    9:40 AM  Fall Risk   Falls in the past year? 0 0 0 0 0  Number falls in past yr: 0 0  0 0  Injury with Fall? 0 0  0 0  Risk for fall due to : No Fall Risks No Fall Risks     Follow up Falls evaluation completed Falls evaluation completed  Falls evaluation completed Falls evaluation completed    MEDICARE RISK AT HOME:  Medicare Risk at Home Any stairs in or around the home?: No If so, are there any without handrails?: No Home free of loose throw rugs in walkways, pet beds, electrical cords, etc?: Yes Adequate lighting in your home to reduce risk of falls?: Yes Life alert?: No Use of a cane, walker or w/c?: No Grab bars in the bathroom?: Yes Shower chair or bench in shower?: Yes Elevated toilet seat or a handicapped toilet?: Yes  TIMED UP AND GO:  Was the test performed?  No  Cognitive Function: 6CIT completed    05/03/2021   10:00 AM 12/25/2015    2:10 PM  MMSE - Mini Mental State Exam  Orientation to time 5 5   Orientation to Place 5 5   Registration 3 3   Attention/ Calculation 4 4   Recall 1 1   Language- name 2 objects 2 2   Language- repeat 1 1  Language- follow 3 step command 2 3   Language- read & follow direction 1 1   Write a sentence 1 1   Copy design 1 1   Total score 26 27      Data saved with a previous flowsheet row definition        09/21/2024   10:27 AM 04/29/2023    3:24 PM  6CIT Screen  What Year? 0 points 0 points  What month? 0 points 0 points  What time? 0 points 0 points  Count back from 20 0 points 0 points  Months in reverse 0 points 2 points  Repeat phrase 0 points 0 points  Total Score 0 points 2 points    Immunizations Immunization History  Administered Date(s) Administered   Influenza,inj,Quad PF,6+ Mos 02/26/2016    Polio, Unspecified 04/07/1958, 05/26/1958, 08/21/1960   Td 01/05/2002   Tdap 07/31/2014   Tetanus 04/07/1958, 05/26/1958, 08/21/1960    Screening Tests Health Maintenance  Topic Date Due   Pneumococcal Vaccine: 50+ Years (1 of 2 - PCV) Never done  Zoster Vaccines- Shingrix (1 of 2) Never done   Diabetic kidney evaluation - Urine ACR  11/06/2011   Medicare Annual Wellness (AWV)  04/28/2024   Influenza Vaccine  07/29/2024   DTaP/Tdap/Td (3 - Td or Tdap) 07/31/2024   HEMOGLOBIN A1C  01/19/2025   OPHTHALMOLOGY EXAM  03/01/2025   FOOT EXAM  06/24/2025   Diabetic kidney evaluation - eGFR measurement  07/19/2025   Colonoscopy  07/19/2031   Hepatitis C Screening  Completed   HPV VACCINES  Aged Out   Meningococcal B Vaccine  Aged Out   COVID-19 Vaccine  Discontinued    Health Maintenance Items Addressed:patient declined   Additional Screening:  Vision Screening: Recommended annual ophthalmology exams for early detection of glaucoma and other disorders of the eye. Is the patient up to date with their annual eye exam?  No  Who is the provider or what is the name of the office in which the patient attends annual eye exams?   Dental Screening: Recommended annual dental exams for proper oral hygiene  Community Resource Referral / Chronic Care Management: CRR required this visit?  No   CCM required this visit?  No   Plan:    I have personally reviewed and noted the following in the patient's chart:   Medical and social history Use of alcohol, tobacco or illicit drugs  Current medications and supplements including opioid prescriptions. Patient is not currently taking opioid prescriptions. Functional ability and status Nutritional status Physical activity Advanced directives List of other physicians Hospitalizations, surgeries, and ER visits in previous 12 months Vitals Screenings to include cognitive, depression, and falls Referrals and appointments  In addition, I have  reviewed and discussed with patient certain preventive protocols, quality metrics, and best practice recommendations. A written personalized care plan for preventive services as well as general preventive health recommendations were provided to patient.   Lyle MARLA Right, [place]   09/21/2024   After Visit Summary: (MyChart) Due to this being a telephonic visit, the after visit summary with patients personalized plan was offered to patient via MyChart   Notes: Nothing significant to report at this time.

## 2024-09-21 NOTE — Patient Instructions (Signed)
 [name],  Thank you for taking the time for your Medicare Wellness Visit. I appreciate your continued commitment to your health goals. Please review the care plan we discussed, and feel free to reach out if I can assist you further.  Medicare recommends these wellness visits once per year to help you and your care team stay ahead of potential health issues. These visits are designed to focus on prevention, allowing your provider to concentrate on managing your acute and chronic conditions during your regular appointments.  Please note that Annual Wellness Visits do not include a physical exam. Some assessments may be limited, especially if the visit was conducted virtually. If needed, we may recommend a separate in-person follow-up with your provider.  Ongoing Care Seeing your primary care provider every 3 to 6 months helps us  monitor your health and provide consistent, personalized care.   Referrals If a referral was made during today's visit and you haven't received any updates within two weeks, please contact the referred provider directly to check on the status.  Recommended Screenings:  Health Maintenance  Topic Date Due   Pneumococcal Vaccine for age over 49 (1 of 2 - PCV) Never done   Zoster (Shingles) Vaccine (1 of 2) Never done   Yearly kidney health urinalysis for diabetes  11/06/2011   Medicare Annual Wellness Visit  04/28/2024   Flu Shot  07/29/2024   DTaP/Tdap/Td vaccine (3 - Td or Tdap) 07/31/2024   Hemoglobin A1C  01/19/2025   Eye exam for diabetics  03/01/2025   Complete foot exam   06/24/2025   Yearly kidney function blood test for diabetes  07/19/2025   Colon Cancer Screening  07/19/2031   Hepatitis C Screening  Completed   HPV Vaccine  Aged Out   Meningitis B Vaccine  Aged Out   COVID-19 Vaccine  Discontinued       09/21/2024   10:29 AM  Advanced Directives  Does Patient Have a Medical Advance Directive? No  Would patient like information on creating a  medical advance directive? No - Patient declined   Advance Care Planning is important because it: Ensures you receive medical care that aligns with your values, goals, and preferences. Provides guidance to your family and loved ones, reducing the emotional burden of decision-making during critical moments.  Vision: Annual vision screenings are recommended for early detection of glaucoma, cataracts, and diabetic retinopathy. These exams can also reveal signs of chronic conditions such as diabetes and high blood pressure.  Dental: Annual dental screenings help detect early signs of oral cancer, gum disease, and other conditions linked to overall health, including heart disease and diabetes.  Please see the attached documents for additional preventive care recommendations.

## 2024-09-27 ENCOUNTER — Other Ambulatory Visit: Payer: Self-pay | Admitting: Family

## 2024-09-27 ENCOUNTER — Other Ambulatory Visit: Payer: Self-pay | Admitting: Family Medicine

## 2024-09-27 ENCOUNTER — Other Ambulatory Visit (HOSPITAL_BASED_OUTPATIENT_CLINIC_OR_DEPARTMENT_OTHER): Payer: Self-pay

## 2024-09-27 DIAGNOSIS — G8929 Other chronic pain: Secondary | ICD-10-CM

## 2024-09-27 DIAGNOSIS — R29898 Other symptoms and signs involving the musculoskeletal system: Secondary | ICD-10-CM

## 2024-09-27 DIAGNOSIS — M51369 Other intervertebral disc degeneration, lumbar region without mention of lumbar back pain or lower extremity pain: Secondary | ICD-10-CM

## 2024-09-27 DIAGNOSIS — M4802 Spinal stenosis, cervical region: Secondary | ICD-10-CM

## 2024-09-27 MED ORDER — OXYCODONE-ACETAMINOPHEN 5-325 MG PO TABS
1.0000 | ORAL_TABLET | ORAL | 0 refills | Status: DC | PRN
  Filled 2024-09-27 – 2024-09-29 (×2): qty 180, 30d supply, fill #0

## 2024-09-27 NOTE — Telephone Encounter (Signed)
 Requesting: oxycodone  5-325mg   Contract: 06/05/23 UDS: 06/05/23 Last Visit: 06/28/24 Next Visit: None Last Refill: 08/28/24 #180 and 0RF   Please Advise

## 2024-09-28 ENCOUNTER — Telehealth: Payer: Self-pay | Admitting: Lab

## 2024-09-28 NOTE — Telephone Encounter (Signed)
 Patient calling for diabetic shoe order that should have been in ordered in 05/2024 please review and I can fax.

## 2024-09-29 ENCOUNTER — Other Ambulatory Visit (HOSPITAL_BASED_OUTPATIENT_CLINIC_OR_DEPARTMENT_OTHER): Payer: Self-pay

## 2024-10-03 ENCOUNTER — Other Ambulatory Visit: Payer: Self-pay | Admitting: Pulmonary Disease

## 2024-10-04 ENCOUNTER — Other Ambulatory Visit (HOSPITAL_BASED_OUTPATIENT_CLINIC_OR_DEPARTMENT_OTHER): Payer: Self-pay

## 2024-10-04 MED ORDER — UMECLIDINIUM-VILANTEROL 62.5-25 MCG/ACT IN AEPB
1.0000 | INHALATION_SPRAY | Freq: Every day | RESPIRATORY_TRACT | 1 refills | Status: DC
  Filled 2024-10-04: qty 60, 30d supply, fill #0
  Filled 2024-11-07: qty 60, 30d supply, fill #1

## 2024-10-07 ENCOUNTER — Other Ambulatory Visit: Payer: Self-pay | Admitting: Cardiology

## 2024-10-07 DIAGNOSIS — E11649 Type 2 diabetes mellitus with hypoglycemia without coma: Secondary | ICD-10-CM

## 2024-10-10 ENCOUNTER — Other Ambulatory Visit: Payer: Self-pay | Admitting: Family Medicine

## 2024-10-10 DIAGNOSIS — E119 Type 2 diabetes mellitus without complications: Secondary | ICD-10-CM

## 2024-10-11 ENCOUNTER — Other Ambulatory Visit (HOSPITAL_BASED_OUTPATIENT_CLINIC_OR_DEPARTMENT_OTHER): Payer: Self-pay

## 2024-10-11 MED ORDER — TRESIBA FLEXTOUCH 100 UNIT/ML ~~LOC~~ SOPN
10.0000 [IU] | PEN_INJECTOR | Freq: Every day | SUBCUTANEOUS | 2 refills | Status: DC
  Filled 2024-10-11: qty 3, 30d supply, fill #0

## 2024-10-13 ENCOUNTER — Other Ambulatory Visit (HOSPITAL_BASED_OUTPATIENT_CLINIC_OR_DEPARTMENT_OTHER): Payer: Self-pay

## 2024-10-13 ENCOUNTER — Telehealth: Payer: Self-pay | Admitting: Cardiology

## 2024-10-13 ENCOUNTER — Other Ambulatory Visit (HOSPITAL_COMMUNITY): Payer: Self-pay

## 2024-10-13 ENCOUNTER — Other Ambulatory Visit: Payer: Self-pay | Admitting: Family Medicine

## 2024-10-13 ENCOUNTER — Other Ambulatory Visit: Payer: Self-pay

## 2024-10-13 DIAGNOSIS — R110 Nausea: Secondary | ICD-10-CM

## 2024-10-13 MED ORDER — MOUNJARO 10 MG/0.5ML ~~LOC~~ SOAJ
10.0000 mg | SUBCUTANEOUS | 0 refills | Status: DC
  Filled 2024-10-13: qty 2, 28d supply, fill #0

## 2024-10-13 MED ORDER — PANTOPRAZOLE SODIUM 40 MG PO TBEC
40.0000 mg | DELAYED_RELEASE_TABLET | Freq: Two times a day (BID) | ORAL | 1 refills | Status: AC
  Filled 2024-10-13 (×2): qty 180, 90d supply, fill #0
  Filled 2025-01-11: qty 180, 90d supply, fill #1

## 2024-10-13 NOTE — Telephone Encounter (Signed)
*  STAT* If patient is at the pharmacy, call can be transferred to refill team.   1. Which medications need to be refilled? (please list name of each medication and dose if known)   tirzepatide  (MOUNJARO ) 7.5 MG/0.5ML Pen  2. Would you like to learn more about the convenience, safety, & potential cost savings by using the [facility]?   3. Are you open to using the [facility] (Type [facility]. ).  4. Which pharmacy/location (including street and city if local pharmacy) is medication to be sent to?  [facility]   5. Do they need a 30 day or 90 day supply?   90 day  Patient stated he is completely out of this medication.  Patient has appointment scheduled on 11/11 with LOIS Louder, PA.  Patient wants a call back to confirm medication has been sent.

## 2024-10-13 NOTE — Telephone Encounter (Signed)
 Lost 20 lbs. Ready to increase Mounjaro  dose to 10 mg. Prescription sent to preferred pharmacy. Had couple episodes of hypoglycemia advised to d/c 10 units of basal insulin .

## 2024-10-14 ENCOUNTER — Other Ambulatory Visit (HOSPITAL_BASED_OUTPATIENT_CLINIC_OR_DEPARTMENT_OTHER): Payer: Self-pay

## 2024-10-14 ENCOUNTER — Other Ambulatory Visit: Payer: Self-pay | Admitting: Family

## 2024-10-14 DIAGNOSIS — G8929 Other chronic pain: Secondary | ICD-10-CM

## 2024-10-17 ENCOUNTER — Other Ambulatory Visit (HOSPITAL_BASED_OUTPATIENT_CLINIC_OR_DEPARTMENT_OTHER): Payer: Self-pay

## 2024-10-18 DIAGNOSIS — M25551 Pain in right hip: Secondary | ICD-10-CM | POA: Diagnosis not present

## 2024-10-19 ENCOUNTER — Other Ambulatory Visit (HOSPITAL_BASED_OUTPATIENT_CLINIC_OR_DEPARTMENT_OTHER): Payer: Self-pay

## 2024-10-25 ENCOUNTER — Other Ambulatory Visit: Payer: Self-pay

## 2024-10-25 ENCOUNTER — Other Ambulatory Visit: Payer: Self-pay | Admitting: Family Medicine

## 2024-10-25 ENCOUNTER — Other Ambulatory Visit (HOSPITAL_BASED_OUTPATIENT_CLINIC_OR_DEPARTMENT_OTHER): Payer: Self-pay

## 2024-10-25 DIAGNOSIS — E1169 Type 2 diabetes mellitus with other specified complication: Secondary | ICD-10-CM

## 2024-10-25 DIAGNOSIS — M51369 Other intervertebral disc degeneration, lumbar region without mention of lumbar back pain or lower extremity pain: Secondary | ICD-10-CM

## 2024-10-25 DIAGNOSIS — M4802 Spinal stenosis, cervical region: Secondary | ICD-10-CM

## 2024-10-25 DIAGNOSIS — R29898 Other symptoms and signs involving the musculoskeletal system: Secondary | ICD-10-CM

## 2024-10-25 MED ORDER — NEXLIZET 180-10 MG PO TABS
1.0000 | ORAL_TABLET | Freq: Every day | ORAL | 0 refills | Status: DC
  Filled 2024-10-25: qty 90, 90d supply, fill #0

## 2024-10-25 MED ORDER — FENOFIBRATE 160 MG PO TABS
160.0000 mg | ORAL_TABLET | Freq: Every day | ORAL | 0 refills | Status: DC
  Filled 2024-10-25: qty 90, 90d supply, fill #0

## 2024-10-25 MED ORDER — OXYCODONE-ACETAMINOPHEN 5-325 MG PO TABS
1.0000 | ORAL_TABLET | ORAL | 0 refills | Status: DC | PRN
  Filled 2024-10-25 – 2024-10-28 (×2): qty 180, 30d supply, fill #0

## 2024-10-25 MED ORDER — EMPAGLIFLOZIN 25 MG PO TABS
25.0000 mg | ORAL_TABLET | Freq: Every day | ORAL | 0 refills | Status: DC
  Filled 2024-10-25: qty 90, 90d supply, fill #0

## 2024-10-25 NOTE — Telephone Encounter (Signed)
 Requesting: oxycodone  5-325mg   Contract: 06/05/23 UDS: 06/05/23 Last Visit:06/28/24 Next Visit: None Last Refill: 09/27/24 #180 and 0RF   Please Advise

## 2024-10-26 NOTE — Progress Notes (Deleted)
 Cardiology Office Note   Date:  10/26/2024  ID:  Ronald Petersen, DOB 26-Oct-1956, MRN [id number] PCP: Antonio Meth, [name], DO  [facility] Providers Cardiologist:  Dub Huntsman, DO Sleep Medicine:  Debby Sor, MD (Inactive)   History of Present Illness Ronald Petersen is a 68 y.o. male with a past medical history of CAD, type 2 diabetes, hypertension, hyperlipidemia, mild aortic stenosis, mild mitral valve regurgitation, obesity, OSA.  Patient followed by Dr. Huntsman and presents today for 77-month follow-up appointment  Patient previously had heart catheterization 10/2020 and was found to have three-vessel CAD including 80-90% proximal/mid LAD stenosis involving D1, 70% proximal LCx and 80-90% ostial OM1 stenoses, and diffuse mild to moderate RCA plaquing with 70% lesion in rPLA.  Underwent successful IVUS guided PCI to proximal-mid LAD with DES, PTCA to ostial D1.  Also underwent successful IVUS guided PCI to ostial/proximal left circumflex extending into OM1 using 2 overlapping DES.  Patient was initially loaded with Plavix .  However, found to be positive for cytochrome P4 50 and was transitioned to Brilinta .   Patient underwent repeat heart catheterization in 11/2021.  Found to have severe bifurcation disease involving distal left main with at least 40% stenosis going into ostial left circumflex.  Culprit lesion was ostial LAD 95% stenosis.  Due to the location of the stenosis and involvement of left main, PCI of the LAD was suboptimal.  Was referred to CT surgery.  Ultimately underwent CABG x 2 with LIMA-LAD, SVG-OM on 12/24/2021.  Echocardiogram at that time showed EF 60-65%  Most recent cardiac catheterization was completed in 02/2023.  Showed that SVG-OM and LIMA-LAD were both widely patent.  Recommended medical management.  Most recent echocardiogram from 02/10/2024 showed EF 60-65%, no regional wall motion abnormalities, mild LVH, normal RV systolic function, mild MR, mild  AS  Seen by Dr. Huntsman in 01/2024.  Patient reported having intermittent chest pain and was started on Ranexa .  Unable to be added on Imdur  or amlodipine  due to low BP. patient had not been compliant with CPAP.  Reported being unable to tolerate and he was unwilling to go back on this.  CAD  - Previously had CABG x 2 with LIMA-LAD, SVG-OM in 11/2021.  Most recent cath from 02/2023 showed that both grafts were widely patent - - Continue aspirin  81 mg daily - Continue nexlizet  180-10 mg daily, fenofibrate  160 mg daily - Continue ranexa  500 mg BID    HLD  - Most recent lipid panel from 12/2023 showed LDL 148, HDL 43, triglycerides 179, total cholesterol 222 - Currently on nexlizet  180-10 mg daily, fenofibrate  160 mg daily - Referred to PharmD for repatha ***  OSA on CPAP  - Patient was unable to tolerate CPAP in the past and was unwilling to reattempt - Continue Mounjaro  for weight loss-follows with our Pharm.D. for titration  Mild AS Mild MR  - Noted on echocardiogram 01/2024. - Anticipate repeat echo in 2 years   Type 2 diabetes - Followed by PCP.  A1c 6.4 in 06/2024  ROS: ***  Studies Reviewed      *** Risk Assessment/Calculations {Does this patient have ATRIAL FIBRILLATION?:[id number]} No BP recorded.  {Refresh Note OR Click here to enter BP  :1}***       Physical Exam VS:  There were no vitals taken for this visit.       Wt Readings from Last 3 Encounters:  09/21/24 178 lb (80.7 kg)  07/19/24 198 lb (89.8 kg)  06/28/24  199 lb (90.3 kg)    GEN: Well nourished, well developed in no acute distress NECK: No JVD; No carotid bruits CARDIAC: ***RRR, no murmurs, rubs, gallops RESPIRATORY:  Clear to auscultation without rales, wheezing or rhonchi  ABDOMEN: Soft, non-tender, non-distended EXTREMITIES:  No edema; No deformity   ASSESSMENT AND PLAN ***    {Are you ordering a CV Procedure (e.g. stress test, cath, DCCV, TEE, etc)?   Press F2        :[id number]}  Dispo:  ***  Signed, [name], PA-C

## 2024-10-27 NOTE — Progress Notes (Signed)
 Ronald Petersen                                          MRN: [id number]   10/27/2024   The [facility] Quality Team Specialist reviewed this patient medical record for the purposes of chart review for care gap closure. The following were reviewed: chart review for care gap closure-kidney health evaluation for diabetes:eGFR  and uACR.    [facility] Quality Team

## 2024-10-28 ENCOUNTER — Other Ambulatory Visit (HOSPITAL_BASED_OUTPATIENT_CLINIC_OR_DEPARTMENT_OTHER): Payer: Self-pay

## 2024-11-07 ENCOUNTER — Other Ambulatory Visit: Payer: Self-pay | Admitting: Cardiology

## 2024-11-07 NOTE — Progress Notes (Unsigned)
 Cardiology Office Note:  .   Date:  11/08/2024  ID:  [name], DOB 1956-04-03, MRN [id number] PCP: Antonio Meth, Ronald SAUNDERS, DO  [facility] Providers Cardiologist:  Kardie Tobb, DO Sleep Medicine:  Debby Sor, MD (Inactive) {  History of Present Illness: .   Ronald Petersen is a 68 y.o. 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    Last seen in heartcare by Dr. Sheena in 01/2024.  Reported having intermittent chest pain and was started on Ranexa .  Unable to be added on Imdur  or amlodipine  due to low BP.  Recommended to consider low-dose of midodrine if BP remains low.  Patient is not compliant with CPAP.  Reported being unable to tolerate and he was unwilling to resume CPAP. Referred to pharmacy team to consider Zepbound .  Pharmacy started Mounjaro . Recent phone call 10/13/2024 noted 20 lbs weight loss, dose increased to 10 mg, and a couple of episodes of hypoglycemia, therefore advised to d/c 10 units of basal insulin .   Today, reports improvement with chest pain and SOB after initially starting Ranexa , however more recently notes intermittent chest pain and SOB with exertion that has lasted for 2 to 3 minutes and resolves with rest.  He has taken NTG approximately 4 times since last OV.  Also reports unchanged LE edema throughout the day that resolves overnight and new orthopnea x 2-3 months but denies drowning sensation. Patient does not use CPAP. Denies any palpations, dizziness, n/v, syncope. Reports compliance with  medications. Patient is able to complete household duties. Denies tobacco use/alcohol/drug use. Denies any recent hospitalizations or visits to the emergency department.   ROS: 10 point review of system has been reviewed and considered negative except ones been listed in the HPI.   Studies Reviewed: .   Zio 05/2022 Patch Wear Time:  14 days and 0 hours (2023-05-21T23:55:19-0400 to 2023-06-04T23:55:23-0400)   Patient had a min HR of 45 bpm, max HR of 102 bpm, and avg HR of 60 bpm. Predominant underlying rhythm was Sinus Rhythm. Isolated SVEs were rare (<1.0%), SVE Couplets were rare (<1.0%), and SVE Triplets were rare (<1.0%). Isolated VEs were rare (<1.0%, 4309), VE Triplets were rare (<1.0%, 1), and no VE Couplets were present. Ventricular Trigeminy was present.    Conclusion: Unremarkable study with no evidence of significant arrhythmia.  Cath 03/18/2023   Mid LM to Ost LAD lesion is 40% stenosed with 40% stenosed side branch in Ost Cx.  SVG to OM is widely patent.   Ost LAD to Prox LAD lesion is 95% stenosed.  LIMA to LAD is widely patent.   1st Diag lesion is 25% stenosed.   RPAV lesion is 50% stenosed.  Small vessel.   Non-stenotic Prox LAD to Mid LAD lesion was previously treated.   Prior circumflex stent is widely patent.   Prior OM1 stent is patent.   The left ventricular systolic function is normal.   LV end diastolic pressure is normal.  LVEDP 8 mm Hg.   The left ventricular ejection fraction is 55-65% by visual  estimate.   There is no aortic valve stenosis.   Continue aggressive secondary prevention.  ECHO 02/10/2024 IMPRESSIONS   1. Left ventricular ejection fraction, by estimation, is 60 to 65%. The  left ventricle has normal function. The left ventricle has no regional  wall motion abnormalities. There is mild left ventricular hypertrophy.  Left ventricular diastolic parameters  are indeterminate. The average left ventricular global longitudinal strain  is -21.0 %. The  global longitudinal strain is normal.   2. Right ventricular systolic function is normal. The right ventricular  size is normal. Tricuspid regurgitation signal is inadequate for assessing  PA pressure.   3. Mild mitral valve regurgitation.   4. There is moderate calcification of the aortic valve. Aortic valve  regurgitation is trivial. Mild aortic valve stenosis.   5. The inferior vena cava is normal in size with greater than 50%  respiratory variability, suggesting right atrial pressure of 3 mmHg.   Physical Exam:   VS:  BP 112/70   Pulse 60   Ht 5' 11 (1.803 m)   Wt 187 lb 6.4 oz (85 kg)   SpO2 98%   BMI 26.14 kg/m    Wt Readings from Last 3 Encounters:  11/08/24 187 lb 6.4 oz (85 kg)  09/21/24 178 lb (80.7 kg)  07/19/24 198 lb (89.8 kg)    GEN: Well nourished, well developed in no acute distress while sitting in chair.  NECK: No JVD; No carotid bruits CARDIAC: RRR, 2/6 systolic murmur best heard over LUSB, no rubs, gallops RESPIRATORY:  Clear to auscultation without rales, wheezing or rhonchi  ABDOMEN: Soft, non-tender, non-distended EXTREMITIES:  No edema; No deformity   ASSESSMENT AND PLAN: .   Coronary artery disease involving native coronary artery of native heart, unspecified whether angina present Hyperlipidemia LDL goal <55 Statin intolerance Cath 10/2020: three-vessel CAD including 80-90% proximal/mid LAD stenosis involving D1, 70% proximal LCx and 80-90% ostial OM1 stenoses, and diffuse mild to moderate RCA plaquing with 70% lesion in rPLA.  s/p DES x1 to prox-mid LAD, overlapping DES x2 to ostial-prox Lcx, and PTCA to ostial D1. Patient was initially loaded with Plavix .  However, found to be positive for cytochrome P4 50 and was transitioned to Brilinta .  Cath in 11/2021.  Found to have severe bifurcation disease involving distal left main with at least 40% stenosis going into ostial left circumflex.  Culprit lesion was ostial LAD 95% stenosis.  Due to the location of  the stenosis and involvement of left main, PCI of the LAD was suboptimal.  Was referred to CT surgery.  Ultimately underwent CABG x 2 with LIMA-LAD, SVG-OM on 12/24/2021.  Echocardiogram at that time showed EF 60-65% Most recent cardiac catheterization was completed in 02/2023.  Showed that SVG-OM and LIMA-LAD were both widely patent. Full report above. Recommended medical management. Most recent echocardiogram from 02/10/2024 showed EF 60-65%, no regional wall motion abnormalities, mild LVH, normal RV systolic function, mild MR, mild AS Since starting Ranexa , patient reports improvement with chest pain and SOB after initially starting Ranexa , however more recently notes intermittent chest pain and SOB with exertion that has lasted for 2 to 3 minutes and resolves with rest. Order Imdur  15 mg. No hx of HA. BP log x 1 m/o. Discussed to contact office if SBP consistently < 90 or any symptoms. If BP allows then would consider increasing Imdur  to 30 mg. If BP does not allow then would consider increasing Ranexa  to 1000 mg BID.  No need for further  ischemic evaluation at this time, however if no improvement with antianginals then would consider Cardiac PET.  Avoid CCTA with hx of CABG and stents.  Lipid panel 12/2023 showed LDL 148, HDL 43, triglycerides 179, total cholesterol 222  Order referral to Pharm.D. for Repatha. Patient agrees.  Continue ASA 81 mg, ranexa  500 mg BID, nexlizet  180-10 mg daily, fenofibrate  160 mg daily Previously discontinued BB due to hypotension.   HTN Reports well controlled Home SBP: 98-120 BP this OV well controlled today: 112/70 Order Imdur  as above.  Previously discontinued BB due to hypotension.  Encourage physical activity for 150 minutes per week and heart healthy low sodium diet. Discussed limiting sodium intake to < 2 grams daily.    OSA (obstructive sleep apnea) Reports not compliance with CPAP. He is not interested in resuming CPAP.   Valvular heart disease Most  recent ECHO 01/2024 as above: mild MR, mild AS Will plan for repeat ECHO in 2 years unless indicated earlier.   Type 2 diabetes mellitus without complication, without long-term current use of insulin  (HCC) A1C 6.7  in 06/2024 Pharmacy started Mounjaro . Recent phone call 10/13/2024 noted 20 lbs weight loss, dose increased to 10 mg, and a couple of episodes of hypoglycemia, therefore advised to d/c 10 units of basal insulin . Patient reports he received a call about increasing Mounjaro  to 12.5 mg, however no documentation per chart review. I would not feel comfortable increasing dose with hypoglycemia episodes. Encouraged to follow up with pharmacy. Will forward my note to pharmacy.  Followed by PCP and pharmacy   Dispo: Follow up with Dr. Sheena or APP in 4-6 weeks.   Signed, Lorette CINDERELLA Kapur, PA-C

## 2024-11-08 ENCOUNTER — Ambulatory Visit: Attending: Cardiology | Admitting: Physician Assistant

## 2024-11-08 ENCOUNTER — Other Ambulatory Visit (HOSPITAL_BASED_OUTPATIENT_CLINIC_OR_DEPARTMENT_OTHER): Payer: Self-pay

## 2024-11-08 ENCOUNTER — Encounter: Payer: Self-pay | Admitting: Physician Assistant

## 2024-11-08 VITALS — BP 112/70 | HR 60 | Ht 71.0 in | Wt 187.4 lb

## 2024-11-08 DIAGNOSIS — Z789 Other specified health status: Secondary | ICD-10-CM

## 2024-11-08 DIAGNOSIS — G4733 Obstructive sleep apnea (adult) (pediatric): Secondary | ICD-10-CM | POA: Diagnosis not present

## 2024-11-08 DIAGNOSIS — E119 Type 2 diabetes mellitus without complications: Secondary | ICD-10-CM

## 2024-11-08 DIAGNOSIS — I10 Essential (primary) hypertension: Secondary | ICD-10-CM | POA: Diagnosis not present

## 2024-11-08 DIAGNOSIS — E785 Hyperlipidemia, unspecified: Secondary | ICD-10-CM | POA: Diagnosis not present

## 2024-11-08 DIAGNOSIS — I2510 Atherosclerotic heart disease of native coronary artery without angina pectoris: Secondary | ICD-10-CM | POA: Diagnosis not present

## 2024-11-08 DIAGNOSIS — I38 Endocarditis, valve unspecified: Secondary | ICD-10-CM

## 2024-11-08 MED ORDER — ISOSORBIDE MONONITRATE ER 30 MG PO TB24
ORAL_TABLET | ORAL | 3 refills | Status: DC
  Filled 2024-11-08: qty 30, 56d supply, fill #0
  Filled 2024-12-29: qty 30, 56d supply, fill #1

## 2024-11-08 NOTE — Patient Instructions (Signed)
 Thank you for choosing  [facility]!     Medication Instructions:  Start Imdur  30mg . Take one half tablet daily.  Monitor your blood pressure for one month  Record readings on the blood pressure log.  A referral has been placed for you to meet with one of our  Pharmacist to discuss Repatha.  *If you need a refill on your cardiac medications before your next appointment, please call your pharmacy*   Lab Work: No labs were ordered during today's visit.  If you have labs (blood work) drawn today and your tests are completely normal, you will receive your results only by: MyChart Message (if you have MyChart) OR A paper copy in the mail If you have any lab test that is abnormal or we need to change your treatment, we will call you to review the results.   Testing/Procedures: No procedures were ordered during today's visit.   Your next appointment:   4-6 week(s)   Provider:   Kardie Tobb, DO or available APP     Follow-Up: At [facility], you and your health needs are our priority.  As part of our continuing mission to provide you with exceptional heart care, we have created designated Provider Care Teams.  These Care Teams include your primary Cardiologist (physician) and Advanced Practice Providers (APPs -  Physician Assistants and Nurse Practitioners) who all work together to provide you with the care you need, when you need it. We recommend signing up for the patient portal called MyChart.  Sign up information is provided on this After Visit Summary.  MyChart is used to connect with patients for Virtual Visits (Telemedicine).  Patients are able to view lab/test results, encounter notes, upcoming appointments, etc.  Non-urgent messages can be sent to your provider as well.   To learn more about what you can do with MyChart, go to [email or web address].

## 2024-11-09 ENCOUNTER — Other Ambulatory Visit (HOSPITAL_COMMUNITY): Payer: Self-pay

## 2024-11-16 ENCOUNTER — Other Ambulatory Visit (HOSPITAL_COMMUNITY): Payer: Self-pay

## 2024-11-16 ENCOUNTER — Other Ambulatory Visit (HOSPITAL_BASED_OUTPATIENT_CLINIC_OR_DEPARTMENT_OTHER): Payer: Self-pay

## 2024-11-16 ENCOUNTER — Other Ambulatory Visit: Payer: Self-pay | Admitting: Cardiology

## 2024-11-16 MED ORDER — MOUNJARO 10 MG/0.5ML ~~LOC~~ SOAJ
10.0000 mg | SUBCUTANEOUS | 0 refills | Status: DC
  Filled 2024-11-16: qty 2, 28d supply, fill #0

## 2024-11-22 ENCOUNTER — Other Ambulatory Visit (HOSPITAL_BASED_OUTPATIENT_CLINIC_OR_DEPARTMENT_OTHER): Payer: Self-pay

## 2024-11-28 ENCOUNTER — Other Ambulatory Visit: Payer: Self-pay | Admitting: Family

## 2024-11-28 DIAGNOSIS — M4802 Spinal stenosis, cervical region: Secondary | ICD-10-CM

## 2024-11-28 DIAGNOSIS — R29898 Other symptoms and signs involving the musculoskeletal system: Secondary | ICD-10-CM

## 2024-11-28 DIAGNOSIS — G8929 Other chronic pain: Secondary | ICD-10-CM

## 2024-11-28 DIAGNOSIS — M51369 Other intervertebral disc degeneration, lumbar region without mention of lumbar back pain or lower extremity pain: Secondary | ICD-10-CM

## 2024-12-02 ENCOUNTER — Other Ambulatory Visit (HOSPITAL_BASED_OUTPATIENT_CLINIC_OR_DEPARTMENT_OTHER): Payer: Self-pay

## 2024-12-02 MED ORDER — OXYCODONE-ACETAMINOPHEN 5-325 MG PO TABS
1.0000 | ORAL_TABLET | ORAL | 0 refills | Status: AC | PRN
  Filled 2024-12-02: qty 180, 30d supply, fill #0

## 2024-12-02 MED ORDER — TIZANIDINE HCL 4 MG PO TABS
4.0000 mg | ORAL_TABLET | Freq: Four times a day (QID) | ORAL | 2 refills | Status: AC | PRN
  Filled 2024-12-02: qty 30, 8d supply, fill #0
  Filled 2024-12-26: qty 30, 8d supply, fill #1
  Filled 2025-01-16: qty 30, 8d supply, fill #2

## 2024-12-02 NOTE — Telephone Encounter (Signed)
 Refill request for oxycodone . No longer on med list.

## 2024-12-05 ENCOUNTER — Other Ambulatory Visit: Payer: Self-pay | Admitting: Pulmonary Disease

## 2024-12-05 ENCOUNTER — Other Ambulatory Visit (HOSPITAL_BASED_OUTPATIENT_CLINIC_OR_DEPARTMENT_OTHER): Payer: Self-pay

## 2024-12-07 ENCOUNTER — Ambulatory Visit: Attending: Physician Assistant | Admitting: Physician Assistant

## 2024-12-07 ENCOUNTER — Other Ambulatory Visit (HOSPITAL_BASED_OUTPATIENT_CLINIC_OR_DEPARTMENT_OTHER): Payer: Self-pay

## 2024-12-07 ENCOUNTER — Encounter: Payer: Self-pay | Admitting: Physician Assistant

## 2024-12-07 VITALS — BP 116/72 | Ht 71.0 in | Wt 197.0 lb

## 2024-12-07 DIAGNOSIS — E785 Hyperlipidemia, unspecified: Secondary | ICD-10-CM

## 2024-12-07 DIAGNOSIS — R079 Chest pain, unspecified: Secondary | ICD-10-CM

## 2024-12-07 DIAGNOSIS — I25709 Atherosclerosis of coronary artery bypass graft(s), unspecified, with unspecified angina pectoris: Secondary | ICD-10-CM

## 2024-12-07 DIAGNOSIS — I10 Essential (primary) hypertension: Secondary | ICD-10-CM | POA: Diagnosis not present

## 2024-12-07 DIAGNOSIS — E119 Type 2 diabetes mellitus without complications: Secondary | ICD-10-CM | POA: Diagnosis not present

## 2024-12-07 MED ORDER — RANOLAZINE ER 1000 MG PO TB12
1000.0000 mg | ORAL_TABLET | Freq: Two times a day (BID) | ORAL | 2 refills | Status: AC
  Filled 2024-12-07: qty 180, 90d supply, fill #0

## 2024-12-07 NOTE — Progress Notes (Unsigned)
 Cardiology Office Note   Date:  12/09/2024  ID:  AMERE BRICCO, DOB Aug 18, 1956, MRN [id number] PCP: Antonio Meth, [name], DO  [facility] Providers Cardiologist:  Kardie Tobb, DO Sleep Medicine:  Debby Sor, MD (Inactive)     History of Present Illness Ronald Petersen is a 68 y.o. 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  He had IVUS guided DES to proximal to/mid LAD, PTCA of ostial D1 and overlapping DES x 2 to ostial/proximal left circumflex artery in November 2021.  Repeat cardiac catheterization in December 2021 showed stable coronary artery disease.  Patient had another cardiac catheterization in December 2022 that showed 40% left main lesion and 95% ostial to proximal LAD lesion, EF 55 to 65%.  The location of left main involvement make PCI of LAD suboptimal, therefore he ultimately underwent CABG.  He underwent repeat cardiac catheterization on 03/18/2023 that showed widely patent LIMA-LAD was 95% ostial LAD lesion, widely patent SVG-OM, 50% RPAV residual, EF 55 to 65%.  Echocardiogram obtained in February 2025 showed EF 60 to 65%, mild LVH, mild MR, trivial AI, mild aortic stenosis.  Patient was seen by Dr. Sheena in February 2025 due to intermittent chest pain and was started on Ranexa .  Unable to add Imdur  or amlodipine  due to low blood pressure.  He was later started on Mounjaro  and had 20 pound weight loss.  Patient was last seen by Lorette Kapur PA-C on 11/08/2024 at which time he continued to have occasional chest discomfort, he was started on Imdur  15 mg daily.  He was also referred to our clinical pharmacist to consider Repatha as LDL remain elevated 148  Patient presents today for follow-up at which time, he continued to have intermittent chest discomfort.  Symptom occurs both at rest and with exertion I will obtain a PET stress test to assess for signs of  ischemia.  He has no lower extremity edema, orthopnea or PND.  Risk and benefit of the stress test was discussed with the patient who was agreeable to proceed.  ROS:   Patient complains of no chest pain.  He has no lower extremity edema, orthopnea or PND.  Studies Reviewed EKG Interpretation Date/Time:  Wednesday December 07 2024 10:37:43 EST Ventricular Rate:  46 PR Interval:  162 QRS Duration:  120 QT Interval:  450 QTC Calculation: 393 R Axis:   93  Text Interpretation: Sinus bradycardia Rightward axis Non-specific intra-ventricular conduction delay Nonspecific T wave abnormality When compared with ECG of 17-Mar-2023 14:01, PREVIOUS ECG IS PRESENT Confirmed by Janene Boer [phone]) on 12/07/2024 10:45:52 AM    Cardiac Studies & Procedures   ______________________________________________________________________________________________ CARDIAC CATHETERIZATION  CARDIAC CATHETERIZATION 03/18/2023  Conclusion   Mid LM to Ost LAD lesion is 40% stenosed with 40% stenosed side branch in Ost Cx.  SVG to OM is widely patent.   Ost LAD to Prox LAD lesion is 95% stenosed.  LIMA to LAD is widely patent.   1st Diag lesion is 25% stenosed.   RPAV lesion is 50% stenosed.  Small vessel.   Non-stenotic Prox LAD to Mid LAD lesion was previously treated.   Prior circumflex stent is widely patent.   Prior OM1 stent is patent.   The left ventricular systolic function is normal.   LV end diastolic pressure is normal.  LVEDP 8 mm Hg.   The left ventricular ejection fraction is 55-65% by  visual estimate.   There is no aortic valve stenosis.  Continue aggressive secondary prevention.  Findings Coronary Findings Diagnostic  Dominance: Right  Left Main Vessel is large. There is mild diffuse disease throughout the vessel. Mid LM to Ost LAD lesion is 40% stenosed with 40% stenosed side branch in Ost Cx. The lesion is located at the bifurcation.  Left Anterior Descending Vessel is large. Ost LAD to  Prox LAD lesion is 95% stenosed. The lesion is located at the bifurcation, focal and concentric. Non-stenotic Prox LAD to Mid LAD lesion was previously treated.  First Diagonal Branch Vessel is moderate in size. 1st Diag lesion is 25% stenosed. The lesion was previously treated .  Second Diagonal Branch Vessel is moderate in size.  Left Circumflex Vessel is large. Non-stenotic Prox Cx lesion was previously treated. The lesion is concentric. The lesion is moderately calcified. Proximal edge of the stent involved in the distal LM-ostial lesion.  This would mean stenting the LAD into the left main would potentially impinge the LCx requiring treatment of the LCx as well.  First Obtuse Marginal Branch Vessel is large in size. Non-stenotic 1st Mrg lesion was previously treated. Stent from LCx and OM  Second Obtuse Marginal Branch Vessel is small in size.  Third Obtuse Marginal Branch Vessel is small in size.  Right Coronary Artery Vessel is large. There is mild diffuse disease throughout the vessel.  Right Posterior Descending Artery Vessel is small in size.  Right Posterior Atrioventricular Artery Vessel is moderate in size. RPAV lesion is 50% stenosed.  Graft To 1st Mrg  LIMA Graft To Mid LAD  Intervention  No interventions have been documented.   CARDIAC CATHETERIZATION  CARDIAC CATHETERIZATION 12/16/2021  Conclusion   Mid LM to Ost LAD lesion is 40% stenosed with 40% stenosed side branch in Ost Cx.   Ost LAD to Prox LAD lesion is 95% stenosed.   Prox LAD to Mid LAD DES Stent is widely patent   Prox Cx- 1st Mrg DES STENT is widely patent   1st Diag lesion is 25% stenosed.   RPAV lesion is 50% stenosed.   -----------------------------------   The left ventricular systolic function is normal.  The left ventricular ejection fraction is 55-65% by visual estimate.   LV end diastolic pressure is normal.   There is no aortic valve stenosis.  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 -> Location of the stenosis and involvement of left main makes PCI of the LAD suboptimal especially in the patient who has pending surgery.  Would not be willing to stop antiplatelet agent after 3 to 6 months in the setting of Left Main Stenting. Only minimal RCA disease as well as downstream LCx and LAD disease. Preserved LVEF normal EDP.  RECOMMENDATION Based on significance of ostial left main disease and level of his symptoms, I feel it is safer to admit him to the hospital for CVTS consultation.  We will start IV heparin . Titrate home dose of statin Restart BP meds Sliding scale insulin    Alm Clay, MD  Findings Coronary Findings Diagnostic  Dominance: Right  Left Main Vessel is large. There is mild diffuse disease throughout the vessel. Mid LM to Ost LAD lesion is 40% stenosed with 40% stenosed side branch in Ost Cx. The lesion is located at the bifurcation.  Left Anterior Descending Vessel is large. Ost LAD to Prox LAD  lesion is 95% stenosed. The lesion is located at the bifurcation, focal and concentric. Non-stenotic Prox LAD to Mid LAD lesion was previously treated.  First Diagonal Branch Vessel is moderate in size. 1st Diag lesion is 25% stenosed. The lesion was previously treated .  Second Diagonal Branch Vessel is moderate in size.  Left Circumflex Vessel is large. Non-stenotic Prox Cx lesion was previously treated. The lesion is concentric. The lesion is moderately calcified. Proximal edge of the stent involved in the distal LM-ostial lesion.  This would mean stenting the LAD into the left main would potentially impinge the LCx requiring treatment of the LCx as well.  First Obtuse Marginal Branch Vessel is large in size. Non-stenotic 1st Mrg lesion was previously treated. Stent from LCx and OM  Second Obtuse  Marginal Branch Vessel is small in size.  Third Obtuse Marginal Branch Vessel is small in size.  Right Coronary Artery Vessel is large. There is mild diffuse disease throughout the vessel.  Right Posterior Descending Artery Vessel is small in size.  Right Posterior Atrioventricular Artery Vessel is moderate in size. RPAV lesion is 50% stenosed.  Intervention  No interventions have been documented.     ECHOCARDIOGRAM  ECHOCARDIOGRAM COMPLETE 02/10/2024  Narrative ECHOCARDIOGRAM REPORT    Patient Name:   Ronald Petersen Roseman Date of Exam: 02/10/2024 Medical Rec #:  [id number]        Height:       71.0 in Accession #:    [id number]       Weight:       205.0 lb Date of Birth:  December 29, 1956        BSA:          2.131 m Patient Age:    67 years         BP:           114/70 mmHg Patient Gender: M                HR:           58 bpm. Exam Location:  [address]  Procedure: 2D Echo, 3D Echo and Strain Analysis (Both Spectral and Color Flow Doppler were utilized during procedure).  Indications:    R06.00 Dyspnea  History:        Patient has prior history of Echocardiogram examinations, most recent 12/24/2021. CHF, CAD, Prior CABG, Signs/Symptoms:Fatigue, Chest Pain and Shortness of Breath; Risk Factors:Hypertension, Diabetes, Dyslipidemia, Former Smoker and Sleep Apnea. Leg swelling. Pleural effusion. Mild pulmonary hypertension. Mild aortic stenosis. Aortic atherosclerosis.  Sonographer:    Jon Hacker RCS Referring Phys: [id number] ADEWALE A OLALERE  IMPRESSIONS   1. Left ventricular ejection fraction, by estimation, is 60 to 65%. The left ventricle has normal function. The left ventricle has no regional wall motion abnormalities. There is mild left ventricular hypertrophy. Left ventricular diastolic parameters are indeterminate. The average left ventricular global longitudinal strain is -21.0 %. The global longitudinal strain is normal. 2. Right ventricular systolic  function is normal. The right ventricular size is normal. Tricuspid regurgitation signal is inadequate for assessing PA pressure. 3. Mild mitral valve regurgitation. 4. There is moderate calcification of the aortic valve. Aortic valve regurgitation is trivial. Mild aortic valve stenosis. 5. The inferior vena cava is normal in size with greater than 50% respiratory variability, suggesting right atrial pressure of 3 mmHg.  FINDINGS Left Ventricle: Left ventricular ejection fraction, by estimation, is 60 to 65%. The left ventricle has normal function. The left ventricle has no  regional wall motion abnormalities. The average left ventricular global longitudinal strain is -21.0 %. Strain was performed and the global longitudinal strain is normal. The left ventricular internal cavity size was normal in size. There is mild left ventricular hypertrophy. Left ventricular diastolic parameters are indeterminate.  Right Ventricle: The right ventricular size is normal. Right ventricular systolic function is normal. Tricuspid regurgitation signal is inadequate for assessing PA pressure.  Left Atrium: Left atrial size was normal in size.  Right Atrium: Right atrial size was normal in size.  Pericardium: There is no evidence of pericardial effusion.  Mitral Valve: Mild mitral valve regurgitation.  Tricuspid Valve: Tricuspid valve regurgitation is not demonstrated.  Aortic Valve: There is moderate calcification of the aortic valve. Aortic valve regurgitation is trivial. Mild aortic stenosis is present. Aortic valve mean gradient measures 9.0 mmHg. Aortic valve peak gradient measures 17.0 mmHg. Aortic valve area, by VTI measures 1.83 cm.  Pulmonic Valve: Pulmonic valve regurgitation is mild.  Aorta: The aortic root and ascending aorta are structurally normal, with no evidence of dilitation.  Venous: The inferior vena cava is normal in size with greater than 50% respiratory variability, suggesting right  atrial pressure of 3 mmHg.  IAS/Shunts: The interatrial septum was not well visualized.  Additional Comments: 3D was performed not requiring image post processing on an independent workstation and was normal.   LEFT VENTRICLE PLAX 2D LVIDd:         4.40 cm   Diastology LVIDs:         2.70 cm   LV e' medial:    7.72 cm/s LV PW:         1.00 cm   LV E/e' medial:  14.2 LV IVS:        1.10 cm   LV e' lateral:   15.80 cm/s LVOT diam:     2.10 cm   LV E/e' lateral: 7.0 LV SV:         78 LV SV Index:   37        2D Longitudinal Strain LVOT Area:     3.46 cm  2D Strain GLS Avg:     -21.0 %  3D Volume EF: 3D EF:        58 % LV EDV:       162 ml LV ESV:       68 ml LV SV:        93 ml  RIGHT VENTRICLE RV Basal diam:  3.80 cm RV S prime:     11.20 cm/s TAPSE (M-mode): 2.0 cm  LEFT ATRIUM             Index        RIGHT ATRIUM           Index LA diam:        4.65 cm 2.18 cm/m   RA Area:     16.90 cm LA Vol (A2C):   44.5 ml 20.88 ml/m  RA Volume:   45.20 ml  21.21 ml/m LA Vol (A4C):   65.1 ml 30.55 ml/m LA Biplane Vol: 55.5 ml 26.05 ml/m AORTIC VALVE                     PULMONIC VALVE AV Area (Vmax):    1.62 cm      PR End Diast Vel: 4.39 msec AV Area (Vmean):   1.45 cm AV Area (VTI):     1.83 cm AV Vmax:  206.00 cm/s AV Vmean:          138.000 cm/s AV VTI:            0.426 m AV Peak Grad:      17.0 mmHg AV Mean Grad:      9.0 mmHg LVOT Vmax:         96.50 cm/s LVOT Vmean:        57.800 cm/s LVOT VTI:          0.225 m LVOT/AV VTI ratio: 0.53  AORTA Ao Root diam: 3.60 cm Ao Asc diam:  3.80 cm  MITRAL VALVE MV Area (PHT): 4.74 cm     SHUNTS MV Decel Time: 160 msec     Systemic VTI:  0.22 m MV E velocity: 110.00 cm/s  Systemic Diam: 2.10 cm MV A velocity: 65.00 cm/s MV E/A ratio:  1.69  Photographer signed by Ronal Ross Signature Date/Time: 02/10/2024/2:05:26 PM    Final   TEE  ECHO INTRAOPERATIVE TEE  12/24/2021  Narrative *INTRAOPERATIVE TRANSESOPHAGEAL REPORT *    Patient Name:   Ronald Petersen Sampey Date of Exam: 12/24/2021 Medical Rec #:  [id number]        Height:       71.0 in Accession #:    [id number]       Weight:       185.0 lb Date of Birth:  07/30/1956        BSA:          2.04 m Patient Age:    65 years         BP:           157/74 mmHg Patient Gender: M                HR:           46 bpm. Exam Location:  Inpatient  Transesophogeal exam was perform intraoperatively during surgical procedure. Patient was closely monitored under general anesthesia during the entirety of examination.  Indications:     coronary artery bypass surgery Performing Phys: [id number] LINNIE KIDD LIGHTFOOT Diagnosing Phys: Norleen Pope MD  Complications: No known complications during this procedure. POST-OP IMPRESSIONS _ Left Ventricle: The left ventricle is unchanged from pre-bypass. _ Right Ventricle: The right ventricle appears unchanged from pre-bypass. _ Aorta: The aorta appears unchanged from pre-bypass. _ Left Atrial Appendage: The left atrial appendage appears unchanged from pre-bypass. _ Aortic Valve: The aortic valve appears unchanged from pre-bypass. _ Mitral Valve: The mitral valve appears unchanged from pre-bypass. _ Tricuspid Valve: The tricuspid valve appears unchanged from pre-bypass. _ Pulmonic Valve: The pulmonic valve appears unchanged from pre-bypass.  PRE-OP FINDINGS Left Ventricle: The left ventricle has normal systolic function, with an ejection fraction of 55-60%. The cavity size was mildly dilated.   Right Ventricle: The right ventricle has normal systolic function. The cavity was normal. There is no increase in right ventricular wall thickness.  Left Atrium: Left atrial size was dilated. No left atrial/left atrial appendage thrombus was detected. The left atrial appendage is well visualized and there is no evidence of thrombus present.  Right Atrium: Right atrial size  was normal in size.  Interatrial Septum: No atrial level shunt detected by color flow Doppler. There is no evidence of a patent foramen ovale.  Pericardium: There is no evidence of pericardial effusion.  Mitral Valve: The mitral valve is normal in structure. Mitral valve regurgitation is mild by color flow Doppler.  Tricuspid Valve: The tricuspid  valve was normal in structure. Tricuspid valve regurgitation is mild by color flow Doppler.  Aortic Valve: The aortic valve is tricuspid Aortic valve regurgitation is trivial by color flow Doppler. There is no stenosis of the aortic valve.   Pulmonic Valve: The pulmonic valve was normal in structure. Pulmonic valve regurgitation is trivial by color flow Doppler.   Aorta: The ascending aorta and aortic root are normal in size and structure. There is evidence of plaque in the descending aorta; Grade II, measuring 2-70mm in size.  Shunts: There is no evidence of an atrial septal defect.  +-------------+------------++ AORTIC VALVE              +-------------+------------++ AV Vmax:     206.00 cm/s  +-------------+------------++ AV Vmean:    124.000 cm/s +-------------+------------++ AV VTI:      0.514 m      +-------------+------------++ AV Peak Grad:17.0 mmHg    +-------------+------------++ AV Mean Grad:8.0 mmHg     +-------------+------------++   Norleen Pope MD Electronically signed by Norleen Pope MD Signature Date/Time: 12/24/2021/5:45:24 PM    Final  MONITORS  LONG TERM MONITOR (3-14 DAYS) 06/09/2022  Narrative Patch Wear Time:  14 days and 0 hours (2023-05-21T23:55:19-0400 to 2023-06-04T23:55:23-0400)  Patient had a min HR of 45 bpm, max HR of 102 bpm, and avg HR of 60 bpm. Predominant underlying rhythm was Sinus Rhythm. Isolated SVEs were rare (<1.0%), SVE Couplets were rare (<1.0%), and SVE Triplets were rare (<1.0%). Isolated VEs were rare (<1.0%, 4309), VE Triplets were rare (<1.0%, 1), and  no VE Couplets were present. Ventricular Trigeminy was present.   Conclusion: Unremarkable study with no evidence of significant arrhythmia.       ______________________________________________________________________________________________      Risk Assessment/Calculations          Physical Exam VS:  BP 116/72 (BP Location: Left Arm, Patient Position: Sitting, Cuff Size: Large)   Ht 5' 11 (1.803 m)   Wt 197 lb (89.4 kg)   SpO2 100%   BMI 27.48 kg/m        Wt Readings from Last 3 Encounters:  12/07/24 197 lb (89.4 kg)  11/08/24 187 lb 6.4 oz (85 kg)  09/21/24 178 lb (80.7 kg)    GEN: Well nourished, well developed in no acute distress NECK: No JVD; No carotid bruits CARDIAC: RRR, no murmurs, rubs, gallops RESPIRATORY:  Clear to auscultation without rales, wheezing or rhonchi  ABDOMEN: Soft, non-tender, non-distended EXTREMITIES:  No edema; No deformity   ASSESSMENT AND PLAN  Chest pain: Continue to have chest pain both at rest and with exertion despite medical therapy.  Will proceed with PET stress test.  Increase ranolazine  to 1000 mg twice a day.  Unable to uptitrate Imdur  as patient has occasional dip in the blood pressure down to the 90s.  CAD s/p CABG: Last cardiac catheterization in March 2024 showed widely patent LIMA to LAD, widely patent SVG to OM.  Hypertension: Blood pressure well-controlled  Hyperlipidemia: On fenofibrate  and Nexlizet   Has upcoming visit with lipid clinic.  DM2: Managed by primary care provider.     Informed Consent   Shared Decision Making/Informed Consent The risks [chest pain, shortness of breath, cardiac arrhythmias, dizziness, blood pressure fluctuations, myocardial infarction, stroke/transient ischemic attack, nausea, vomiting, allergic reaction, radiation exposure, metallic taste sensation and life-threatening complications (estimated to be 1 in 10,000)], benefits (risk stratification, diagnosing coronary artery disease,  treatment guidance) and alternatives of a cardiac PET stress test were discussed in detail with Mr.  Elzey and he agrees to proceed.     Dispo: Follow-up in 6 months if stress test came back okay.  Will arrange a earlier visit if stress test come back showing ischemia.  Signed, Scot Ford, PA

## 2024-12-07 NOTE — Patient Instructions (Signed)
 Medication Instructions:  INCREASE RANEXA  TO 1000 MG TWICE A DAY *If you need a refill on your cardiac medications before your next appointment, please call your pharmacy*  Lab Work: NO LABS If you have labs (blood work) drawn today and your tests are completely normal, you will receive your results only by: MyChart Message (if you have MyChart) OR A paper copy in the mail If you have any lab test that is abnormal or we need to change your treatment, we will call you to review the results.  Testing/Procedures:    Please report to Radiology at the [facility] Main Entrance 30 minutes early for your test.  [address] [place], [place] [address]  How to Prepare for Your Cardiac PET/CT Stress Test:  Nothing to eat or drink, except water, 3 hours prior to arrival time.  NO caffeine/decaffeinated products, or chocolate 12 hours prior to arrival. (Please note decaffeinated beverages (teas/coffees) still contain caffeine).  If you have caffeine within 12 hours prior, the test will need to be rescheduled.  Medication instructions: Do not take erectile dysfunction medications for 72 hours prior to test (sildenafil, tadalafil) Do not take nitrates (isosorbide  mononitrate, Ranexa ) the day before or day of test Do not take tamsulosin the day before or morning of test Hold theophylline containing medications for 12 hours. Hold Dipyridamole 48 hours prior to the test.  Diabetic Preparation: If able to eat breakfast prior to 3 hour fasting, you may take all medications, including your insulin . Do not worry if you miss your breakfast dose of insulin  - start at your next meal. If you do not eat prior to 3 hour fast-Hold all diabetes (oral and insulin ) medications. Patients who wear a continuous glucose monitor MUST remove the device prior to scanning.  You may take your remaining medications with water.  NO perfume, cologne or lotion on chest or abdomen area. FEMALES - Please avoid  wearing dresses to this appointment.  Total time is 1 to 2 hours; you may want to bring reading material for the waiting time.  IF YOU THINK YOU MAY BE PREGNANT, OR ARE NURSING PLEASE INFORM THE TECHNOLOGIST.  In preparation for your appointment, medication and supplies will be purchased.  Appointment availability is limited, so if you need to cancel or reschedule, please call the Radiology Department Scheduler at [phone] 24 hours in advance to avoid a cancellation fee of $100.00  What to Expect When you Arrive:  Once you arrive and check in for your appointment, you will be taken to a preparation room within the Radiology Department.  A technologist or Nurse will obtain your medical history, verify that you are correctly prepped for the exam, and explain the procedure.  Afterwards, an IV will be started in your arm and electrodes will be placed on your skin for EKG monitoring during the stress portion of the exam. Then you will be escorted to the PET/CT scanner.  There, staff will get you positioned on the scanner and obtain a blood pressure and EKG.  During the exam, you will continue to be connected to the EKG and blood pressure machines.  A small, safe amount of a radioactive tracer will be injected in your IV to obtain a series of pictures of your heart along with an injection of a stress agent.    After your Exam:  It is recommended that you eat a meal and drink a caffeinated beverage to counter act any effects of the stress agent.  Drink  plenty of fluids for the remainder of the day and urinate frequently for the first couple of hours after the exam.  Your doctor will inform you of your test results within 7-10 business days.  For more information and frequently asked questions, please visit our website: [email or web address]  For questions about your test or how to prepare for your test, please call: Cardiac Imaging Nurse Navigators Office:  [phone]   Follow-Up: At [facility], you and your health needs are our priority.  As part of our continuing mission to provide you with exceptional heart care, our providers are all part of one team.  This team includes your primary Cardiologist (physician) and Advanced Practice Providers or APPs (Physician Assistants and Nurse Practitioners) who all work together to provide you with the care you need, when you need it.  Your next appointment:   6 month(s)  Provider:   Kardie Tobb, DO

## 2024-12-09 ENCOUNTER — Encounter (HOSPITAL_BASED_OUTPATIENT_CLINIC_OR_DEPARTMENT_OTHER): Payer: Self-pay

## 2024-12-09 ENCOUNTER — Other Ambulatory Visit: Payer: Self-pay | Admitting: Pulmonary Disease

## 2024-12-09 ENCOUNTER — Other Ambulatory Visit (HOSPITAL_BASED_OUTPATIENT_CLINIC_OR_DEPARTMENT_OTHER): Payer: Self-pay

## 2024-12-12 ENCOUNTER — Other Ambulatory Visit (HOSPITAL_COMMUNITY): Payer: Self-pay

## 2024-12-12 ENCOUNTER — Ambulatory Visit: Attending: Cardiology

## 2024-12-12 ENCOUNTER — Telehealth: Payer: Self-pay

## 2024-12-12 DIAGNOSIS — E1169 Type 2 diabetes mellitus with other specified complication: Secondary | ICD-10-CM

## 2024-12-12 DIAGNOSIS — E785 Hyperlipidemia, unspecified: Secondary | ICD-10-CM

## 2024-12-12 NOTE — Telephone Encounter (Signed)
 Pharmacy Patient Advocate Encounter   Received notification from Physician's Office that prior authorization for REPATHA  is required/requested.   Insurance verification completed.   The patient is insured through [facility].   Per test claim: PA required; PA started via CoverMyMeds. KEY [id number] . Please see clinical question(s) below that I am not finding the answer to in their chart and advise.  PLAN NEEDS LDL WITHIN LAST 120 DAYS, PLEASE ADVISE

## 2024-12-12 NOTE — Telephone Encounter (Signed)
 Sounds good dear, just reach back out once the results populate and I will get it submitted.

## 2024-12-12 NOTE — Assessment & Plan Note (Addendum)
 Assessment:  LDL goal: < 55  mg/dl; last LDLc  851 mg/dl (07/7973) Tolerates Nexlizet  180/10 mg daily and fenofibrate  160 mg daily  well without any side effects  Intolerance to statins atorvastatin , simvastatin , and rosuvastatin  (myalgias) Discussed next potential options (PCSK-9 inhibitors); cost, dosing efficacy, side effects  Emphasized the importance of heart healthy diet and regular physical activity; patient to consider chair exercises  Plan: Obtain lipid panel today to serve as baseline prior to PCSK9i treatment and satisfy Repatha  PA Continue Nexlizet  180/10 mg daily and fenofibrate  160 mg daily; Patient can stop Nexlizet  once he receives Repatha   Will apply for PA for PCSK9i; will inform patient upon approval Lipid lab due in 3 months after starting PCSK9i

## 2024-12-12 NOTE — Progress Notes (Signed)
 Patient ID: Ronald Petersen                 DOB: 1956/03/12                    MRN: [id number]      HPI: Ronald Petersen is a 68 y.o. male patient referred to lipid clinic by Ronald Kapur, PA. 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  Patient was evaluated by cardiology one week ago for chest pain. Ranolazine  was increased to 1000 mg twice daily, and a stress test was ordered. He is currently on fenofibrate  and Nexlizet  for lipid management. Last LDL in January 2025 was 148 mg/dL, above goal (<44 mg/dL); no recent labs. History of statin intolerance with prior trials causing myalgias.  Today, the patient is in good spirits. He is taking Nexlizet  180/10 mg daily and fenofibrate  160 mg daily, tolerating both without side effects. He is open to injectable therapy and mentioned familiarity with Repatha  from TV commercials.  We reviewed options for lowering LDL cholesterol, including PCSK-9 inhibitors.  Discussed mechanisms of action, dosing, side effects and potential decreases in LDL cholesterol.  Also reviewed cost information and potential options for patient assistance.   Current Medications: Nexlizet  180/10 mg daily and fenofibrate  160 mg daily Intolerances: statin intolerance (atorvastatin , simvastatin , and rosuvastatin  (myalgias) Risk Factors: age, HTN, CAD /p CABG x 2 (LIMA-LAD, SVG-OM) 12/24/2021, T2D LDL goal: < 55 Lipid panel (12/2023): Chol 222, Trig 179, HDL 43, LDL 148 Liver enzymes (06/2024): ALT 27, AST 24, Alk phos 77 Kidney function (06/2024): Scr 0.92, Crcl: 87 ml/min  Diet:  Patients diet includes chopped steak, salad, sandwiches, and vegetables for lunch or dinner, with water as the primary beverage (2-3 bottles daily)  Exercise: none, arthritis and rotator cuff tear limited exercise   Family History:  Relation Problem Comments  Mother - Licensed Conveyancer (Deceased) COPD      Father - Ronald Petersen (Deceased) Stroke     Sister - debbie (Deceased at age 48) Ovarian cancer     Sister - vicki (Alive) Heart disease MI  Stomach cancer     Sister - lois (Deceased) Heart disease MI    Maternal Grandmother Stomach cancer     Other Alcohol abuse     Other Depression     Other Arthritis     Other Hypertension     Other Coronary artery disease     Other - Dawn Ovarian cancer neice    Other - Dana Ovarian cancer neice    Other - neg hx   Neg Hx Colon cancer   Colon polyps       Social History:  Alcohol: none Smoking: none   Labs:  Lipid Panel     Component Value Date/Time   CHOL 222 (H) 01/19/2024 1752   TRIG 179 (H) 01/19/2024 1752   TRIG 148 12/10/2006 1603   HDL 43 01/19/2024 1752   CHOLHDL 5.2 (H) 01/19/2024 1752   VLDL 23.2 06/05/2023 1011   LDLCALC 148 (H) 01/19/2024 1752   LDLDIRECT 93.0 05/31/2020 0910    Past Medical History:  Diagnosis Date   Allergy    Anxiety    Arthritis    CAD (coronary artery disease)    Cancer (HCC)    CHF (congestive heart failure) (HCC)    Complication of anesthesia    aspirated with back surgery at age 52  Depression    Diabetes mellitus Type II    GERD (gastroesophageal reflux disease)    Hyperlipidemia    Hypertension    Mild aortic stenosis    Mild carotid artery disease    Neuromuscular disorder (HCC)    NEUROPATHY   Plavix  resistance    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 Hyperlipidemia -  Problem  Hyperlipidemia Associated With Type 2 Diabetes Mellitus (Hcc)   Hyperlipidemia associated with type 2 diabetes mellitus (HCC) Assessment:  LDL goal: < 55  mg/dl; last LDLc  851 mg/dl (07/7973) Tolerates Nexlizet  180/10 mg daily and fenofibrate  160 mg daily  well without any  side effects  Intolerance to statins atorvastatin , simvastatin , and rosuvastatin  (myalgias) Discussed next potential options (PCSK-9 inhibitors); cost, dosing efficacy, side effects  Emphasized the importance of heart healthy diet and regular physical activity; patient to consider chair exercises  Plan: Obtain lipid panel today to serve as baseline prior to PCSK9i treatment and satisfy Repatha  PA Continue Nexlizet  180/10 mg daily and fenofibrate  160 mg daily; Patient can stop Nexlizet  once he receives Repatha   Will apply for PA for PCSK9i; will inform patient upon approval Lipid lab due in 3 months after starting PCSK9i     Thank you,  Sahas Sluka E. Noriel Guthrie, Pharm.D, CPP [facility] [address], [place], [place] [address] Phone: [phone]; Fax: [phone]        [1]  Current Outpatient Medications on File Prior to Visit  Medication Sig Dispense Refill   acetaminophen  (TYLENOL ) 650 MG CR tablet Take 650 mg by mouth every 8 (eight) hours as needed for pain.     aspirin  EC 325 MG EC tablet Take 1 tablet (325 mg total) by mouth daily.     Bempedoic Acid -Ezetimibe  (NEXLIZET ) 180-10 MG TABS Take 1 tablet by mouth daily. (Replaces ezetimibe  10mg ) 90 tablet 0   ELDERBERRY PO Take 300 mg by mouth daily.     empagliflozin  (JARDIANCE ) 25 MG TABS tablet Take 1 tablet (25 mg total) by mouth daily before breakfast. 90 tablet 0   famotidine  (PEPCID ) 40 MG tablet Take 1 tablet (40 mg total) by mouth at bedtime. 30 tablet 11   fenofibrate  160 MG tablet Take 1 tablet (160 mg total) by mouth daily. 90 tablet 0   gabapentin  (NEURONTIN ) 300 MG capsule Take 3 capsules (900 mg total) by mouth 3 (three) times daily. 270 capsule 11   glucose blood (ONETOUCH VERIO) test strip USE AS INSTRUCTED TO CHECK BLOOD SUGAR ONCE A DAY 100 strip 12   Insulin  Pen Needle (TRUEPLUS 5-BEVEL PEN NEEDLES) 31G X 5 MM MISC Use to inject insulin  as directed. 100 each 1    isosorbide  mononitrate (IMDUR ) 30 MG 24 hr tablet Take one half tablet by mouth daily 30 tablet 3   Magnesium  Citrate 200 MG TABS Take 400 mg by mouth daily.     nitroGLYCERIN  (NITROSTAT ) 0.4 MG SL tablet Place 1 tablet (0.4 mg total) under the tongue every 5 (five) minutes as needed for chest pain. 90 tablet 3   ondansetron  (ZOFRAN ) 4 MG tablet Take 1 tablet (4 mg total) by mouth every 8 (eight) hours as needed. 20 tablet 2   oxyCODONE -acetaminophen  (PERCOCET/ROXICET) 5-325 MG tablet Take 1 tablet  by mouth every 4 (four) hours as needed for severe pain (pain score 7-10). 180 tablet 0   pantoprazole  (PROTONIX ) 40 MG tablet Take 1 tablet (40 mg total) by mouth 2 (two) times daily. 180 tablet 1   ranolazine  (RANEXA ) 1000 MG SR tablet Take 1 tablet (1,000 mg total) by mouth 2 (two) times daily. 180 tablet 2   sertraline  (ZOLOFT ) 100 MG tablet Take 1.5 tablets (150 mg total) by mouth daily. 135 tablet 1   tirzepatide  (MOUNJARO ) 10 MG/0.5ML Pen Inject 10 mg into the skin once a week. 2 mL 0   tiZANidine  (ZANAFLEX ) 4 MG tablet Take 1 tablet (4 mg total) by mouth every 6 (six) hours as needed for muscle spasms. 30 tablet 2   traZODone  (DESYREL ) 50 MG tablet Take 1 tablet (50 mg total) by mouth at bedtime as needed for sleep. 30 tablet 2   umeclidinium-vilanterol (ANORO ELLIPTA ) 62.5-25 MCG/ACT AEPB Inhale 1 puff into the lungs daily. 60 each 1   vitamin E  180 MG (400 UNITS) capsule Take 1,600 Units by mouth daily.     No current facility-administered medications on file prior to visit.  [2]  Allergies Allergen Reactions   Niacin Anaphylaxis    Flushing - required ED visit

## 2024-12-12 NOTE — Patient Instructions (Signed)
 Your Results:             Your most recent labs Goal  Total Cholesterol 222 < 200  Triglycerides 179 < 150  HDL (happy/good cholesterol) 43 > 40  LDL (lousy/bad cholesterol 148 < 55   Medication changes: Obtain lipid panel today as baseline  Continue fenofibrate  160 mg daily  You can stop Nexlizet  once you receive Repatha  We will start the process to get Repatha  covered by your insurance.  Once the prior authorization is complete, we will call you to let you know and confirm pharmacy information.    Lab orders: We want to repeat labs 3 months after starting PCSK9i.  We will send you a lab order to remind you once        we get closer to that time.    Ruthvik Barnaby E. Wauneta Silveria, Pharm.D, CPP [facility] [address], [place], [place] [address] Phone: [phone]; Fax: [phone]     Praluent is a cholesterol medication that improved your body's ability to get rid of bad cholesterol known as LDL. It can lower your LDL up to 60%. It is an injection that is given under the skin every 2 weeks. The most common side effects of Praluent include runny nose, symptoms of the common cold, rarely flu or flu-like symptoms, back/muscle pain in about 3-4% of the patients, and redness, pain, or bruising at the injection site.    Repatha  is a cholesterol medication that improved your body's ability to get rid of bad cholesterol known as LDL. It can lower your LDL up to 60%! It is an injection that is given under the skin every 2 weeks. The medication often requires a prior authorization from your insurance company. We will take care of submitting all the necessary information to your insurance company to get it approved. The most common side effects of Repatha  include runny nose, symptoms of the common cold, rarely flu or flu-like symptoms, back/muscle pain in about 3-4% of the patients, and redness, pain, or bruising at the injection site.    It is  also recommended that patients with high cholesterol adhere to a heart healthy diet, get regular exercise, avoid use of tobacco products, and maintain a healthy weight. Steps that you can take to help in these areas:  Limit consumption of trans fats, saturated fats, and cholesterol in your diet  Increase intake of lean meats such as chicken, turkey, and fish  Increase intake of foods rich in fiber such as fresh fruits, vegetables, beans and oatmeal Exercise as you are able; even 30 minutes of walking daily can aid in increasing heart health      Copay Assistance:  The [employer] foundation offers assistance to help pay for medication copays.  They will cover copays for all cholesterol lowering meds, including statins, fibrates, omega-3 oils, ezetimibe , Repatha , Praluent, Nexletol , Nexlizet .  The cards are usually good for $2,500 or 12 months, whichever comes first. Go to healthwellfoundation.org Click on Apply Now Answer questions as to whom is applying (patient or representative) Your disease fund will be hypercholesterolemia - Medicare access Select the cholesterol medication you need assistance with (Repatha , Praluent, Nexlizet ...) They will ask question about qualifying diagnosis - you can mark yes; and do you have insurance coverage.   When they ask what type of assistance you are interested in - copay assistance When you submit, the approval is usually within minutes.  You will need  to print the card information from the site You will need to show this information to your pharmacy, they will bill your Medicare Part D plan first -then bill [employer] Well --for the copay.   You can also call them at [phone], although the hold times can be quite long.

## 2024-12-13 ENCOUNTER — Ambulatory Visit: Payer: Self-pay | Admitting: Physician Assistant

## 2024-12-13 ENCOUNTER — Other Ambulatory Visit (HOSPITAL_BASED_OUTPATIENT_CLINIC_OR_DEPARTMENT_OTHER): Payer: Self-pay

## 2024-12-13 ENCOUNTER — Other Ambulatory Visit: Payer: Self-pay

## 2024-12-13 ENCOUNTER — Other Ambulatory Visit: Payer: Self-pay | Admitting: Pulmonary Disease

## 2024-12-13 ENCOUNTER — Other Ambulatory Visit (HOSPITAL_COMMUNITY): Payer: Self-pay

## 2024-12-13 ENCOUNTER — Other Ambulatory Visit: Payer: Self-pay | Admitting: Family Medicine

## 2024-12-13 DIAGNOSIS — G4700 Insomnia, unspecified: Secondary | ICD-10-CM

## 2024-12-13 LAB — LIPID PANEL
Chol/HDL Ratio: 3.9 ratio (ref 0.0–5.0)
Cholesterol, Total: 163 mg/dL (ref 100–199)
HDL: 42 mg/dL (ref >39)
LDL Chol Calc (NIH): 102 mg/dL — ABNORMAL HIGH (ref 0–99)
Triglycerides: 105 mg/dL (ref 0–149)
VLDL Cholesterol Cal: 19 mg/dL (ref 5–40)

## 2024-12-13 MED ORDER — REPATHA SURECLICK 140 MG/ML ~~LOC~~ SOAJ
140.0000 mg | SUBCUTANEOUS | 1 refills | Status: AC
  Filled 2024-12-13: qty 6, 84d supply, fill #0

## 2024-12-13 MED ORDER — TRAZODONE HCL 50 MG PO TABS
50.0000 mg | ORAL_TABLET | Freq: Every evening | ORAL | 2 refills | Status: AC | PRN
  Filled 2024-12-13: qty 30, 30d supply, fill #0
  Filled 2025-01-26: qty 30, 30d supply, fill #1

## 2024-12-13 NOTE — Telephone Encounter (Signed)
 Pharmacy Patient Advocate Encounter  Received notification from [facility] that Prior Authorization for REPATHA  has been APPROVED from 12/13/24 to 06/11/25. Ran test claim, Copay is $0. This test claim was processed through [facility]- copay amounts may vary at other pharmacies due to pharmacy/plan contracts, or as the patient moves through the different stages of their insurance plan.

## 2024-12-13 NOTE — Telephone Encounter (Signed)
 Called patient to discuss Repatha  copay and confirm preferred pharmacy. Rx sent.

## 2024-12-13 NOTE — Telephone Encounter (Signed)
 Lipid panel reviewed. LDL has improved however, still above goal. In the process of obtaining Repatha  and updated lipid panel needed to move forward with Repatha  PA.

## 2024-12-14 ENCOUNTER — Other Ambulatory Visit (HOSPITAL_BASED_OUTPATIENT_CLINIC_OR_DEPARTMENT_OTHER): Payer: Self-pay

## 2024-12-15 ENCOUNTER — Telehealth (HOSPITAL_BASED_OUTPATIENT_CLINIC_OR_DEPARTMENT_OTHER): Payer: Self-pay | Admitting: *Deleted

## 2024-12-15 MED ORDER — UMECLIDINIUM-VILANTEROL 62.5-25 MCG/ACT IN AEPB
1.0000 | INHALATION_SPRAY | Freq: Every day | RESPIRATORY_TRACT | 1 refills | Status: AC
  Filled 2024-12-15: qty 60, 60d supply, fill #0

## 2024-12-15 NOTE — Telephone Encounter (Signed)
° °  Patient Name: Ronald Petersen  DOB: Nov 27, 1956 MRN: [id number]  Primary Cardiologist: Kardie Tobb, DO  Chart reviewed as part of pre-operative protocol coverage. Given past medical history and time since last visit, based on ACC/AHA guidelines, Ronald Petersen is at acceptable risk for the planned procedure without further cardiovascular testing.  Given his cardiovascular history, would recommend continue on the aspirin  through the procedure, however may reduce the aspirin  down to 81 mg for 1 week prior to the procedure to lower the bleeding risk, and then go back to 325 mg aspirin  after the procedure at the surgeon's discretion.  Patient does not require SBE prophylaxis  I will route this recommendation to the requesting party via Epic fax function and remove from pre-op pool.  Please call with questions.  Ching Rabideau, [place] 12/15/2024, 3:38 PM

## 2024-12-15 NOTE — Telephone Encounter (Signed)
 Pt requesting Anoro refill - not mentioned in LOV notes to continue or d/c. Please advise, thank you!

## 2024-12-15 NOTE — Telephone Encounter (Signed)
 Our office received a DDS clearance on 12/02/24 @ 4:38 pm. Our office called 12/05/24 and left message we needed clarification. Cannot provide blanket type clearance as multiple boxes were checked off.   I called today and obtained information.      Pre-operative Risk Assessment    Patient Name: Ronald Petersen  DOB: September 15, 1956 MRN: [id number]   Date of last office visit: 12/07/24 HAO MENG, PAC Date of next office visit: NONE  Request for Surgical Clearance    Procedure:  SIMPLE DENTAL CLEANING AND 1 TOOTH FOR SURGICAL EXTRACTION  Date of Surgery:  Clearance TBD                                Surgeon:  DR. Froylan, DDS Surgeon's Group or Practice Name:  [facility] Phone number:  [phone] Fax number:  [phone]   Type of Clearance Requested:   - Medical  - Pharmacy:  Hold Aspirin  PT ON 325 MG   Type of Anesthesia:  Local  WITH EPI   Additional requests/questions:    Bonney Niels Jest   12/15/2024, 9:05 AM

## 2024-12-19 ENCOUNTER — Other Ambulatory Visit: Payer: Self-pay | Admitting: Family

## 2024-12-19 ENCOUNTER — Other Ambulatory Visit (HOSPITAL_BASED_OUTPATIENT_CLINIC_OR_DEPARTMENT_OTHER): Payer: Self-pay

## 2024-12-19 ENCOUNTER — Other Ambulatory Visit: Payer: Self-pay | Admitting: Cardiology

## 2024-12-19 DIAGNOSIS — E119 Type 2 diabetes mellitus without complications: Secondary | ICD-10-CM

## 2024-12-19 DIAGNOSIS — M51369 Other intervertebral disc degeneration, lumbar region without mention of lumbar back pain or lower extremity pain: Secondary | ICD-10-CM

## 2024-12-19 DIAGNOSIS — M4802 Spinal stenosis, cervical region: Secondary | ICD-10-CM

## 2024-12-19 DIAGNOSIS — R29898 Other symptoms and signs involving the musculoskeletal system: Secondary | ICD-10-CM

## 2024-12-19 MED ORDER — OXYCODONE-ACETAMINOPHEN 5-325 MG PO TABS
1.0000 | ORAL_TABLET | ORAL | 0 refills | Status: DC | PRN
  Filled 2024-12-19 – 2025-01-02 (×3): qty 180, 30d supply, fill #0

## 2024-12-19 NOTE — Telephone Encounter (Signed)
 Requesting: oxycodone  5-325mg   Contract: 06/05/23 UDS:06/05/23 Last Visit: 06/28/24 Next Visit: None Last Refill: 12/02/24 #180 and RF   Please Advise

## 2024-12-20 ENCOUNTER — Other Ambulatory Visit (HOSPITAL_BASED_OUTPATIENT_CLINIC_OR_DEPARTMENT_OTHER): Payer: Self-pay

## 2024-12-20 MED ORDER — MOUNJARO 10 MG/0.5ML ~~LOC~~ SOAJ
10.0000 mg | SUBCUTANEOUS | 2 refills | Status: AC
  Filled 2024-12-20: qty 2, 28d supply, fill #0
  Filled 2025-01-17: qty 2, 28d supply, fill #1

## 2024-12-27 ENCOUNTER — Other Ambulatory Visit: Payer: Self-pay

## 2024-12-27 ENCOUNTER — Other Ambulatory Visit (HOSPITAL_BASED_OUTPATIENT_CLINIC_OR_DEPARTMENT_OTHER): Payer: Self-pay

## 2024-12-30 ENCOUNTER — Encounter (HOSPITAL_COMMUNITY): Payer: Self-pay

## 2025-01-02 ENCOUNTER — Encounter (HOSPITAL_COMMUNITY): Payer: Self-pay

## 2025-01-02 ENCOUNTER — Other Ambulatory Visit (HOSPITAL_BASED_OUTPATIENT_CLINIC_OR_DEPARTMENT_OTHER): Payer: Self-pay

## 2025-01-03 ENCOUNTER — Encounter (HOSPITAL_COMMUNITY)
Admission: RE | Admit: 2025-01-03 | Discharge: 2025-01-03 | Disposition: A | Discharge status: Home / Self Care | Source: Ambulatory Visit | Attending: Physician Assistant | Admitting: Physician Assistant

## 2025-01-03 DIAGNOSIS — R079 Chest pain, unspecified: Secondary | ICD-10-CM | POA: Diagnosis not present

## 2025-01-03 LAB — NM PET CT CARDIAC PERFUSION MULTI W/ABSOLUTE BLOODFLOW
MBFR: 1.74
Nuc Rest EF: 57 %
Nuc Stress EF: 61 %
Rest MBF: 0.53 ml/g/min
Rest Nuclear Isotope Dose: 22.4 mCi
ST Depression (mm): 0 mm
Stress MBF: 0.92 ml/g/min
Stress Nuclear Isotope Dose: 22.4 mCi
TID: 1.13

## 2025-01-03 MED ORDER — REGADENOSON 0.4 MG/5ML IV SOLN
0.4000 mg | Freq: Once | INTRAVENOUS | Status: AC
  Administered 2025-01-03: 0.4 mg via INTRAVENOUS

## 2025-01-03 MED ORDER — RUBIDIUM RB82 GENERATOR (RUBYFILL)
22.3500 | PACK | Freq: Once | INTRAVENOUS | Status: AC
  Administered 2025-01-03: 22.35 via INTRAVENOUS

## 2025-01-03 MED ORDER — RUBIDIUM RB82 GENERATOR (RUBYFILL)
22.3700 | PACK | Freq: Once | INTRAVENOUS | Status: AC
  Administered 2025-01-03: 22.37 via INTRAVENOUS

## 2025-01-03 MED ORDER — REGADENOSON 0.4 MG/5ML IV SOLN
INTRAVENOUS | Status: AC
  Filled 2025-01-03: qty 5

## 2025-01-04 ENCOUNTER — Other Ambulatory Visit: Payer: Self-pay | Admitting: Neurology

## 2025-01-04 ENCOUNTER — Other Ambulatory Visit (HOSPITAL_BASED_OUTPATIENT_CLINIC_OR_DEPARTMENT_OTHER): Payer: Self-pay

## 2025-01-04 DIAGNOSIS — R209 Unspecified disturbances of skin sensation: Secondary | ICD-10-CM

## 2025-01-04 DIAGNOSIS — G621 Alcoholic polyneuropathy: Secondary | ICD-10-CM

## 2025-01-04 DIAGNOSIS — M48061 Spinal stenosis, lumbar region without neurogenic claudication: Secondary | ICD-10-CM

## 2025-01-04 MED ORDER — GABAPENTIN 300 MG PO CAPS
900.0000 mg | ORAL_CAPSULE | Freq: Three times a day (TID) | ORAL | 0 refills | Status: AC
  Filled 2025-01-04: qty 270, 30d supply, fill #0

## 2025-01-06 ENCOUNTER — Ambulatory Visit: Payer: Self-pay | Admitting: Physician Assistant

## 2025-01-11 ENCOUNTER — Other Ambulatory Visit (HOSPITAL_BASED_OUTPATIENT_CLINIC_OR_DEPARTMENT_OTHER): Payer: Self-pay

## 2025-01-12 ENCOUNTER — Other Ambulatory Visit (HOSPITAL_BASED_OUTPATIENT_CLINIC_OR_DEPARTMENT_OTHER): Payer: Self-pay

## 2025-01-12 MED ORDER — ISOSORBIDE MONONITRATE ER 30 MG PO TB24
30.0000 mg | ORAL_TABLET | Freq: Every day | ORAL | 3 refills | Status: AC
  Filled 2025-01-12 – 2025-01-16 (×5): qty 90, 90d supply, fill #0
  Filled ????-??-??: fill #0

## 2025-01-12 NOTE — Progress Notes (Signed)
 I spoke with [name], he was not aware of the result for some reason. We discussed result briefly, I have placed [name] on my schedule for Monday

## 2025-01-12 NOTE — Telephone Encounter (Signed)
 Hi. I have called the patient and discussed the stress test, I also instructed him to increase Imdur  to 30 mg daily. He says he was not aware of the stress test result and can not see my comment on 1/9. Not sure what happened. Please add him to my schedule for Monday at 8:25AM slot to review the stress test and discuss the next step.  Ronald Petersen

## 2025-01-13 ENCOUNTER — Other Ambulatory Visit (HOSPITAL_BASED_OUTPATIENT_CLINIC_OR_DEPARTMENT_OTHER): Payer: Self-pay

## 2025-01-16 ENCOUNTER — Encounter: Payer: Self-pay | Admitting: Physician Assistant

## 2025-01-16 ENCOUNTER — Other Ambulatory Visit: Payer: Self-pay | Admitting: Family Medicine

## 2025-01-16 ENCOUNTER — Other Ambulatory Visit: Payer: Self-pay

## 2025-01-16 ENCOUNTER — Other Ambulatory Visit (HOSPITAL_BASED_OUTPATIENT_CLINIC_OR_DEPARTMENT_OTHER): Payer: Self-pay

## 2025-01-16 ENCOUNTER — Ambulatory Visit: Attending: Physician Assistant | Admitting: Physician Assistant

## 2025-01-16 VITALS — BP 114/66 | HR 50 | Ht 71.0 in | Wt 198.0 lb

## 2025-01-16 DIAGNOSIS — I200 Unstable angina: Secondary | ICD-10-CM

## 2025-01-16 DIAGNOSIS — I25119 Atherosclerotic heart disease of native coronary artery with unspecified angina pectoris: Secondary | ICD-10-CM

## 2025-01-16 DIAGNOSIS — I2510 Atherosclerotic heart disease of native coronary artery without angina pectoris: Secondary | ICD-10-CM

## 2025-01-16 DIAGNOSIS — R9439 Abnormal result of other cardiovascular function study: Secondary | ICD-10-CM

## 2025-01-16 DIAGNOSIS — E1169 Type 2 diabetes mellitus with other specified complication: Secondary | ICD-10-CM | POA: Diagnosis not present

## 2025-01-16 DIAGNOSIS — I25110 Atherosclerotic heart disease of native coronary artery with unstable angina pectoris: Secondary | ICD-10-CM

## 2025-01-16 DIAGNOSIS — E785 Hyperlipidemia, unspecified: Secondary | ICD-10-CM | POA: Diagnosis not present

## 2025-01-16 DIAGNOSIS — I10 Essential (primary) hypertension: Secondary | ICD-10-CM

## 2025-01-16 DIAGNOSIS — I25700 Atherosclerosis of coronary artery bypass graft(s), unspecified, with unstable angina pectoris: Secondary | ICD-10-CM

## 2025-01-16 DIAGNOSIS — Z951 Presence of aortocoronary bypass graft: Secondary | ICD-10-CM

## 2025-01-16 LAB — BASIC METABOLIC PANEL WITH GFR
BUN/Creatinine Ratio: 29 — ABNORMAL HIGH (ref 10–24)
BUN: 24 mg/dL (ref 8–27)
CO2: 23 mmol/L (ref 20–29)
Calcium: 9.9 mg/dL (ref 8.6–10.2)
Chloride: 100 mmol/L (ref 96–106)
Creatinine, Ser: 0.84 mg/dL (ref 0.76–1.27)
Glucose: 111 mg/dL — ABNORMAL HIGH (ref 70–99)
Potassium: 4.3 mmol/L (ref 3.5–5.2)
Sodium: 139 mmol/L (ref 134–144)
eGFR: 94 mL/min/1.73

## 2025-01-16 LAB — CBC
Hematocrit: 48.4 % (ref 37.5–51.0)
Hemoglobin: 16 g/dL (ref 13.0–17.7)
MCH: 28.7 pg (ref 26.6–33.0)
MCHC: 33.1 g/dL (ref 31.5–35.7)
MCV: 87 fL (ref 79–97)
Platelets: 161 x10E3/uL (ref 150–450)
RBC: 5.57 x10E6/uL (ref 4.14–5.80)
RDW: 12.3 % (ref 11.6–15.4)
WBC: 6.8 x10E3/uL (ref 3.4–10.8)

## 2025-01-16 MED ORDER — AMLODIPINE BESYLATE 2.5 MG PO TABS
2.5000 mg | ORAL_TABLET | Freq: Every day | ORAL | 3 refills | Status: AC
  Filled 2025-01-16 – 2025-01-17 (×2): qty 90, 90d supply, fill #0

## 2025-01-16 MED ORDER — FENOFIBRATE 160 MG PO TABS
160.0000 mg | ORAL_TABLET | Freq: Every day | ORAL | 0 refills | Status: AC
  Filled 2025-01-16: qty 90, 90d supply, fill #0

## 2025-01-16 NOTE — Patient Instructions (Signed)
 Medication Instructions:  START AMLODIPINE  2.5 MG DAILY *If you need a refill on your cardiac medications before your next appointment, please call your pharmacy*  Lab Work: CBC AND BMET TODAY If you have labs (blood work) drawn today and your tests are completely normal, you will receive your results only by: MyChart Message (if you have MyChart) OR A paper copy in the mail If you have any lab test that is abnormal or we need to change your treatment, we will call you to review the results.  Testing/Procedures:  [facility] A DEPT OF [facility] [address] A DEPT OF THE [place] [address] [place] [address] Dept: [phone] Loc: [phone]  Ronald Petersen  01/16/2025  You are scheduled for a Cardiac Catheterization on Monday, January 26 with Dr. Ozell Fell.  1. Please arrive at the [facility] (Main Entrance A) at [facility]: [address] [place], [place] [address] at 5:30 AM (This time is 2 hour(s) before your procedure to ensure your preparation).   Free valet parking service is available. You will check in at ADMITTING. The support person will be asked to wait in the waiting room.  It is OK to have someone drop you off and come back when you are ready to be discharged.    Special note: Every effort is made to have your procedure done on time. Please understand that emergencies sometimes delay scheduled procedures.  2. Diet: Nothing to eat after midnight.   3. Hydration: Nothing to eat and drink after midnight.  4. Labs: You will need to have blood drawn on ,today 01/16/2025. You do not need to be fasting.  5. Medication instructions in preparation for your procedure:   Contrast Allergy: No  HOLD JARDIANCE  FOR 3 DAYS PRIOR TO PROCEDURE HOLD MOUNJARO  FOR 7 DAYS PRIOR TO PROCEDURE  On the morning of your procedure, take your Aspirin  81 mg and any morning medicines NOT listed above.  You may  use sips of water.  6. Plan to go home the same day, you will only stay overnight if medically necessary. 7. Bring a current list of your medications and current insurance cards. 8. You MUST have a responsible person to drive you home. 9. Someone MUST be with you the first 24 hours after you arrive home or your discharge will be delayed. 10. Please wear clothes that are easy to get on and off and wear slip-on shoes.  Thank you for allowing us  to care for you!   -- [facility]   Follow-Up: At [facility], you and your health needs are our priority.  As part of our continuing mission to provide you with exceptional heart care, our providers are all part of one team.  This team includes your primary Cardiologist (physician) and Advanced Practice Providers or APPs (Physician Assistants and Nurse Practitioners) who all work together to provide you with the care you need, when you need it.  Your next appointment:   4 week(s) after heart cath  Provider:   Hao Meng, PA-C

## 2025-01-16 NOTE — H&P (View-Only) (Signed)
 " Cardiology Office Note   Date:  01/17/2025  ID:  Shamir, Tuzzolino May 26, 1956, MRN [id number] PCP: Antonio Meth, [name], DO   HeartCare Providers Cardiologist:  Kardie Tobb, DO Sleep Medicine:  Debby Sor, MD (Inactive)     History of Present Illness TREYTEN MONESTIME is a 69 y.o. 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  He had IVUS guided DES to proximal to mid LAD, PTCA of ostial D1 and overlapping DES x 2 to ostial/proximal left circumflex artery in November 2021.  Repeat cardiac catheterization in December 2021 showed stable coronary artery disease.  Patient had another cardiac catheterization in December 2022 that showed 40% left main lesion and 95% ostial to proximal LAD lesion, EF 55 to 65%.  The location of left main involvement make PCI of LAD suboptimal, therefore he ultimately underwent CABG.  He underwent repeat cardiac catheterization on 03/18/2023 that showed widely patent LIMA-LAD with 95% ostial LAD lesion, widely patent SVG-OM, 50% RPAV residual, EF 55 to 65%.  Echocardiogram obtained in February 2025 showed EF 60 to 65%, mild LVH, mild MR, trivial AI, mild aortic stenosis.  Patient was seen by Dr. Sheena in February 2025 due to intermittent chest pain and was started on Ranexa .  Unable to add Imdur  or amlodipine  due to low blood pressure.  He was later started on Mounjaro  and had 20 pound weight loss.  Patient was last seen by Lorette Kapur PA-C on 11/08/2024 at which time he continued to have occasional chest discomfort, he was started on Imdur  15 mg daily.  He was also referred to our clinical pharmacist to consider Repatha  as LDL remain elevated 148.   I last saw the patient on 12/07/2024 at which time he continued to have intermittent chest discomfort.  Symptom occurs both at rest and with exertion, therefore I recommended PET stress test.  PET stress test was  obtained on 01/03/2025 which came back showing anterolateral infarction with minimal peri-infarct ischemia with decreased left circumflex stress flow, study was intermediate risk due to blood flow reserve.  There was medium defect of moderate reduction in uptake present in the apical to basal anterolateral location that is partially reversible, EF 57%.  There is also bilateral pulmonary nodule up to 7 mm, both of those nodules are stable dating back to April 2024, therefore suggestive of benign etiology.  Due to persistent chest discomfort, I increase his Imdur  to 30 mg daily.  He was started on Repatha  by lipid clinic.   Patient presents today for cardiology follow-up.  We reviewed stress test results.  The stress test primarily showed anterolateral scar but there was minimal peri-infarct ischemia.  Talking with the patient, he is having almost daily episode of chest pain, primarily when he exert himself and the last a few minutes each time.  When he was first placed on 15 mg daily of Imdur , symptoms improved for the first 3 days before worsens again.  After I increased the Imdur  to 30 mg, symptoms improved again.  I discussed his case with Dr. Segel, given almost daily episode of chest discomfort, he has failed antianginal therapy.  Dr. Segel recommended adding 2.5 mg daily of amlodipine  for further antianginal purposes, however this patient ideally should go through with cardiac catheterization.  Risk and benefit of cardiac catheterization has been discussed with the patient who is agreeable to proceed.  We will  obtain blood work today.  He has self stopped Mounjaro  for the past month.  He is aware to hold Jardiance  for 3 days prior to the procedure.  At this time, he is no longer on Nexclizet since he started on the Repatha .  Most recent blood work did show improvement in the LDL, however LDL still higher than 55.  I will see the patient back in 4 weeks and potentially have patient follow-up with lipid clinic  again.  ROS:   Patient complains of chest pain both at rest and with exertion, mostly with exertion.  He also complains of shortness of breath with exertion.  He has no lower extremity edema, orthopnea or PND.  Studies Reviewed EKG Interpretation Date/Time:  Monday January 16 2025 08:22:05 EST Ventricular Rate:  50 PR Interval:  150 QRS Duration:  116 QT Interval:  454 QTC Calculation: 413 R Axis:   105  Text Interpretation: Sinus bradycardia Rightward axis Abnormal QRS-T angle, consider primary T wave abnormality When compared with ECG of 07-Dec-2024 10:37, No significant change was found Confirmed by Kutler Vanvranken [phone] on 01/16/2025 8:28:31 AM    Cardiac Studies & Procedures   ______________________________________________________________________________________________ CARDIAC CATHETERIZATION  CARDIAC CATHETERIZATION 03/18/2023  Conclusion   Mid LM to Ost LAD lesion is 40% stenosed with 40% stenosed side branch in Ost Cx.  SVG to OM is widely patent.   Ost LAD to Prox LAD lesion is 95% stenosed.  LIMA to LAD is widely patent.   1st Diag lesion is 25% stenosed.   RPAV lesion is 50% stenosed.  Small vessel.   Non-stenotic Prox LAD to Mid LAD lesion was previously treated.   Prior circumflex stent is widely patent.   Prior OM1 stent is patent.   The left ventricular systolic function is normal.   LV end diastolic pressure is normal.  LVEDP 8 mm Hg.   The left ventricular ejection fraction is 55-65% by visual estimate.   There is no aortic valve stenosis.  Continue aggressive secondary prevention.  Findings Coronary Findings Diagnostic  Dominance: Right  Left Main Vessel is large. There is mild diffuse disease throughout the vessel. Mid LM to Ost LAD lesion is 40% stenosed with 40% stenosed side branch in Ost Cx. The lesion is located at the bifurcation.  Left Anterior Descending Vessel is large. Ost LAD to Prox LAD lesion is 95% stenosed. The lesion is located at the  bifurcation, focal and concentric. Non-stenotic Prox LAD to Mid LAD lesion was previously treated.  First Diagonal Branch Vessel is moderate in size. 1st Diag lesion is 25% stenosed. The lesion was previously treated .  Second Diagonal Branch Vessel is moderate in size.  Left Circumflex Vessel is large. Non-stenotic Prox Cx lesion was previously treated. The lesion is concentric. The lesion is moderately calcified. Proximal edge of the stent involved in the distal LM-ostial lesion.  This would mean stenting the LAD into the left main would potentially impinge the LCx requiring treatment of the LCx as well.  First Obtuse Marginal Branch Vessel is large in size. Non-stenotic 1st Mrg lesion was previously treated. Stent from LCx and OM  Second Obtuse Marginal Branch Vessel is small in size.  Third Obtuse Marginal Branch Vessel is small in size.  Right Coronary Artery Vessel is large. There is mild diffuse disease throughout the vessel.  Right Posterior Descending Artery Vessel is small in size.  Right Posterior Atrioventricular Artery Vessel is moderate in size. RPAV lesion is 50% stenosed.  Graft To  1st Mrg  LIMA Graft To Mid LAD  Intervention  No interventions have been documented.   CARDIAC CATHETERIZATION  CARDIAC CATHETERIZATION 12/16/2021  Conclusion   Mid LM to Ost LAD lesion is 40% stenosed with 40% stenosed side branch in Ost Cx.   Ost LAD to Prox LAD lesion is 95% stenosed.   Prox LAD to Mid LAD DES Stent is widely patent   Prox Cx- 1st Mrg DES STENT is widely patent   1st Diag lesion is 25% stenosed.   RPAV lesion is 50% stenosed.   -----------------------------------   The left ventricular systolic function is normal.  The left ventricular ejection fraction is 55-65% by visual estimate.   LV end diastolic pressure is normal.   There is no aortic valve stenosis.  SUMMARY Severe bifurcation disease involving distal Left Main with at least 40%  stenosis going into ostial LCx with a culprit lesion being ostial LAD 95% stenosis. -> Location of the stenosis and involvement of left main makes PCI of the LAD suboptimal especially in the patient who has pending surgery.  Would not be willing to stop antiplatelet agent after 3 to 6 months in the setting of Left Main Stenting. Only minimal RCA disease as well as downstream LCx and LAD disease. Preserved LVEF normal EDP.  RECOMMENDATION Based on significance of ostial left main disease and level of his symptoms, I feel it is safer to admit him to the hospital for CVTS consultation.  We will start IV heparin . Titrate home dose of statin Restart BP meds Sliding scale insulin    Alm Clay, MD  Findings Coronary Findings Diagnostic  Dominance: Right  Left Main Vessel is large. There is mild diffuse disease throughout the vessel. Mid LM to Ost LAD lesion is 40% stenosed with 40% stenosed side branch in Ost Cx. The lesion is located at the bifurcation.  Left Anterior Descending Vessel is large. Ost LAD to Prox LAD lesion is 95% stenosed. The lesion is located at the bifurcation, focal and concentric. Non-stenotic Prox LAD to Mid LAD lesion was previously treated.  First Diagonal Branch Vessel is moderate in size. 1st Diag lesion is 25% stenosed. The lesion was previously treated .  Second Diagonal Branch Vessel is moderate in size.  Left Circumflex Vessel is large. Non-stenotic Prox Cx lesion was previously treated. The lesion is concentric. The lesion is moderately calcified. Proximal edge of the stent involved in the distal LM-ostial lesion.  This would mean stenting the LAD into the left main would potentially impinge the LCx requiring treatment of the LCx as well.  First Obtuse Marginal Branch Vessel is large in size. Non-stenotic 1st Mrg lesion was previously treated. Stent from LCx and OM  Second Obtuse Marginal Branch Vessel is small in size.  Third Obtuse Marginal  Branch Vessel is small in size.  Right Coronary Artery Vessel is large. There is mild diffuse disease throughout the vessel.  Right Posterior Descending Artery Vessel is small in size.  Right Posterior Atrioventricular Artery Vessel is moderate in size. RPAV lesion is 50% stenosed.  Intervention  No interventions have been documented.   STRESS TESTS  NM PET CT CARDIAC PERFUSION MULTI W/ABSOLUTE BLOODFLOW 01/03/2025  Narrative   Findings are consistent with anterolateral infarction with minimal peri-infarct ischemia and with decreased LCX stress flows. The study is intermediate risk due to blood flow reserve (less specifici in patients with prior revascularization).   LV perfusion is abnormal. There is evidence of ischemia. There is evidence of infarction. Defect  1: There is a medium defect with moderate reduction in uptake present in the apical to basal anterolateral location(s) that is partially reversible. There is abnormal wall motion in the defect area. Consistent with infarction and peri-infarct ischemia.   Rest left ventricular function is normal. Rest EF: 57%. Stress left ventricular function is normal. Stress EF: 61%. End diastolic cavity size is normal. End systolic cavity size is normal.   Myocardial blood flow was computed to be 0.80ml/g/min at rest and 0.92ml/g/min at stress. Global myocardial blood flow reserve was 1.74 and was mildly abnormal.   Coronary calcium  assessment not performed due to prior revascularization. Aortic atherosclerosis.  Mild aortic valve calcifiations. Air artifact.   Electronically Signed  By: Stanly Leavens M.D.  EXAM: OVER-READ INTERPRETATION CARDIAC PET-CT  The following report is an over-read performed by radiologist Dr. Camellia Lang [facility] on 01/03/2025. This over-read does not include interpretation of cardiac or coronary anatomy or pathology. The cardiac PET and cardiac CT interpretation by the cardiologist is to  be attached.  COMPARISON:  Chest CT 01/29/2024  FINDINGS: No evidence for lymphadenopathy within the visualized mediastinum or hilar regions.  Similar spiculated right lung nodule measuring 6 mm on image 11/series 4. 7 mm left lower lobe nodule on 60/4 is stable.  Visualized portion of the upper abdomen shows no acute findings.  No worrisome lytic or sclerotic osseous abnormality.  IMPRESSION: Stable bilateral pulmonary nodules measuring up to 7 mm. Both of these nodules are also stable back to a study from 03/31/2023 providing almost 2 years of imaging stability and suggesting benign etiology.   Electronically Signed By: Camellia Candle M.D. On: 01/03/2025 08:34   ECHOCARDIOGRAM  ECHOCARDIOGRAM COMPLETE 02/10/2024  Narrative ECHOCARDIOGRAM REPORT    Patient Name:   YAPHET SMETHURST Copes Date of Exam: 02/10/2024 Medical Rec #:  [id number]        Height:       71.0 in Accession #:    [id number]       Weight:       205.0 lb Date of Birth:  01-15-1956        BSA:          2.131 m Patient Age:    67 years         BP:           114/70 mmHg Patient Gender: M                HR:           58 bpm. Exam Location:  [address]  Procedure: 2D Echo, 3D Echo and Strain Analysis (Both Spectral and Color Flow Doppler were utilized during procedure).  Indications:    R06.00 Dyspnea  History:        Patient has prior history of Echocardiogram examinations, most recent 12/24/2021. CHF, CAD, Prior CABG, Signs/Symptoms:Fatigue, Chest Pain and Shortness of Breath; Risk Factors:Hypertension, Diabetes, Dyslipidemia, Former Smoker and Sleep Apnea. Leg swelling. Pleural effusion. Mild pulmonary hypertension. Mild aortic stenosis. Aortic atherosclerosis.  Sonographer:    Jon Hacker RCS Referring Phys: [id number] ADEWALE A OLALERE  IMPRESSIONS   1. Left ventricular ejection fraction, by estimation, is 60 to 65%. The left ventricle has normal function. The left ventricle has no regional wall  motion abnormalities. There is mild left ventricular hypertrophy. Left ventricular diastolic parameters are indeterminate. The average left ventricular global longitudinal strain is -21.0 %. The global longitudinal strain is normal. 2. Right ventricular systolic function is normal. The right  ventricular size is normal. Tricuspid regurgitation signal is inadequate for assessing PA pressure. 3. Mild mitral valve regurgitation. 4. There is moderate calcification of the aortic valve. Aortic valve regurgitation is trivial. Mild aortic valve stenosis. 5. The inferior vena cava is normal in size with greater than 50% respiratory variability, suggesting right atrial pressure of 3 mmHg.  FINDINGS Left Ventricle: Left ventricular ejection fraction, by estimation, is 60 to 65%. The left ventricle has normal function. The left ventricle has no regional wall motion abnormalities. The average left ventricular global longitudinal strain is -21.0 %. Strain was performed and the global longitudinal strain is normal. The left ventricular internal cavity size was normal in size. There is mild left ventricular hypertrophy. Left ventricular diastolic parameters are indeterminate.  Right Ventricle: The right ventricular size is normal. Right ventricular systolic function is normal. Tricuspid regurgitation signal is inadequate for assessing PA pressure.  Left Atrium: Left atrial size was normal in size.  Right Atrium: Right atrial size was normal in size.  Pericardium: There is no evidence of pericardial effusion.  Mitral Valve: Mild mitral valve regurgitation.  Tricuspid Valve: Tricuspid valve regurgitation is not demonstrated.  Aortic Valve: There is moderate calcification of the aortic valve. Aortic valve regurgitation is trivial. Mild aortic stenosis is present. Aortic valve mean gradient measures 9.0 mmHg. Aortic valve peak gradient measures 17.0 mmHg. Aortic valve area, by VTI measures 1.83 cm.  Pulmonic  Valve: Pulmonic valve regurgitation is mild.  Aorta: The aortic root and ascending aorta are structurally normal, with no evidence of dilitation.  Venous: The inferior vena cava is normal in size with greater than 50% respiratory variability, suggesting right atrial pressure of 3 mmHg.  IAS/Shunts: The interatrial septum was not well visualized.  Additional Comments: 3D was performed not requiring image post processing on an independent workstation and was normal.   LEFT VENTRICLE PLAX 2D LVIDd:         4.40 cm   Diastology LVIDs:         2.70 cm   LV e' medial:    7.72 cm/s LV PW:         1.00 cm   LV E/e' medial:  14.2 LV IVS:        1.10 cm   LV e' lateral:   15.80 cm/s LVOT diam:     2.10 cm   LV E/e' lateral: 7.0 LV SV:         78 LV SV Index:   37        2D Longitudinal Strain LVOT Area:     3.46 cm  2D Strain GLS Avg:     -21.0 %  3D Volume EF: 3D EF:        58 % LV EDV:       162 ml LV ESV:       68 ml LV SV:        93 ml  RIGHT VENTRICLE RV Basal diam:  3.80 cm RV S prime:     11.20 cm/s TAPSE (M-mode): 2.0 cm  LEFT ATRIUM             Index        RIGHT ATRIUM           Index LA diam:        4.65 cm 2.18 cm/m   RA Area:     16.90 cm LA Vol (A2C):   44.5 ml 20.88 ml/m  RA Volume:   45.20 ml  21.21 ml/m LA Vol (A4C):   65.1 ml 30.55 ml/m LA Biplane Vol: 55.5 ml 26.05 ml/m AORTIC VALVE                     PULMONIC VALVE AV Area (Vmax):    1.62 cm      PR End Diast Vel: 4.39 msec AV Area (Vmean):   1.45 cm AV Area (VTI):     1.83 cm AV Vmax:           206.00 cm/s AV Vmean:          138.000 cm/s AV VTI:            0.426 m AV Peak Grad:      17.0 mmHg AV Mean Grad:      9.0 mmHg LVOT Vmax:         96.50 cm/s LVOT Vmean:        57.800 cm/s LVOT VTI:          0.225 m LVOT/AV VTI ratio: 0.53  AORTA Ao Root diam: 3.60 cm Ao Asc diam:  3.80 cm  MITRAL VALVE MV Area (PHT): 4.74 cm     SHUNTS MV Decel Time: 160 msec     Systemic VTI:  0.22 m MV E  velocity: 110.00 cm/s  Systemic Diam: 2.10 cm MV A velocity: 65.00 cm/s MV E/A ratio:  1.69  Photographer signed by Ronal Ross Signature Date/Time: 02/10/2024/2:05:26 PM    Final   TEE  ECHO INTRAOPERATIVE TEE 12/24/2021  Narrative *INTRAOPERATIVE TRANSESOPHAGEAL REPORT *    Patient Name:   NAYQUAN EVINGER Swayze Date of Exam: 12/24/2021 Medical Rec #:  [id number]        Height:       71.0 in Accession #:    [id number]       Weight:       185.0 lb Date of Birth:  1956-01-02        BSA:          2.04 m Patient Age:    65 years         BP:           157/74 mmHg Patient Gender: M                HR:           46 bpm. Exam Location:  Inpatient  Transesophogeal exam was perform intraoperatively during surgical procedure. Patient was closely monitored under general anesthesia during the entirety of examination.  Indications:     coronary artery bypass surgery Performing Phys: [id number] LINNIE KIDD LIGHTFOOT Diagnosing Phys: Norleen Pope MD  Complications: No known complications during this procedure. POST-OP IMPRESSIONS _ Left Ventricle: The left ventricle is unchanged from pre-bypass. _ Right Ventricle: The right ventricle appears unchanged from pre-bypass. _ Aorta: The aorta appears unchanged from pre-bypass. _ Left Atrial Appendage: The left atrial appendage appears unchanged from pre-bypass. _ Aortic Valve: The aortic valve appears unchanged from pre-bypass. _ Mitral Valve: The mitral valve appears unchanged from pre-bypass. _ Tricuspid Valve: The tricuspid valve appears unchanged from pre-bypass. _ Pulmonic Valve: The pulmonic valve appears unchanged from pre-bypass.  PRE-OP FINDINGS Left Ventricle: The left ventricle has normal systolic function, with an ejection fraction of 55-60%. The cavity size was mildly dilated.   Right Ventricle: The right ventricle has normal systolic function. The cavity was normal. There is no increase in right ventricular wall  thickness.  Left Atrium: Left atrial size  was dilated. No left atrial/left atrial appendage thrombus was detected. The left atrial appendage is well visualized and there is no evidence of thrombus present.  Right Atrium: Right atrial size was normal in size.  Interatrial Septum: No atrial level shunt detected by color flow Doppler. There is no evidence of a patent foramen ovale.  Pericardium: There is no evidence of pericardial effusion.  Mitral Valve: The mitral valve is normal in structure. Mitral valve regurgitation is mild by color flow Doppler.  Tricuspid Valve: The tricuspid valve was normal in structure. Tricuspid valve regurgitation is mild by color flow Doppler.  Aortic Valve: The aortic valve is tricuspid Aortic valve regurgitation is trivial by color flow Doppler. There is no stenosis of the aortic valve.   Pulmonic Valve: The pulmonic valve was normal in structure. Pulmonic valve regurgitation is trivial by color flow Doppler.   Aorta: The ascending aorta and aortic root are normal in size and structure. There is evidence of plaque in the descending aorta; Grade II, measuring 2-74mm in size.  Shunts: There is no evidence of an atrial septal defect.  +-------------+------------++ AORTIC VALVE              +-------------+------------++ AV Vmax:     206.00 cm/s  +-------------+------------++ AV Vmean:    124.000 cm/s +-------------+------------++ AV VTI:      0.514 m      +-------------+------------++ AV Peak Grad:17.0 mmHg    +-------------+------------++ AV Mean Grad:8.0 mmHg     +-------------+------------++   Norleen Pope MD Electronically signed by Norleen Pope MD Signature Date/Time: 12/24/2021/5:45:24 PM    Final  MONITORS  LONG TERM MONITOR (3-14 DAYS) 06/09/2022  Narrative Patch Wear Time:  14 days and 0 hours (2023-05-21T23:55:19-0400 to 2023-06-04T23:55:23-0400)  Patient had a min HR of 45 bpm, max HR of 102 bpm, and  avg HR of 60 bpm. Predominant underlying rhythm was Sinus Rhythm. Isolated SVEs were rare (<1.0%), SVE Couplets were rare (<1.0%), and SVE Triplets were rare (<1.0%). Isolated VEs were rare (<1.0%, 4309), VE Triplets were rare (<1.0%, 1), and no VE Couplets were present. Ventricular Trigeminy was present.   Conclusion: Unremarkable study with no evidence of significant arrhythmia.       ______________________________________________________________________________________________      Risk Assessment/Calculations           Physical Exam VS:  BP 114/66 (BP Location: Left Arm, Patient Position: Sitting, Cuff Size: Large)   Pulse (!) 50   Ht 5' 11 (1.803 m)   Wt 198 lb (89.8 kg)   SpO2 98%   BMI 27.62 kg/m        Wt Readings from Last 3 Encounters:  01/16/25 198 lb (89.8 kg)  12/07/24 197 lb (89.4 kg)  11/08/24 187 lb 6.4 oz (85 kg)    GEN: Well nourished, well developed in no acute distress NECK: No JVD; No carotid bruits CARDIAC: RRR, no murmurs, rubs, gallops RESPIRATORY:  Clear to auscultation without rales, wheezing or rhonchi  ABDOMEN: Soft, non-tender, non-distended EXTREMITIES:  No edema; No deformity   ASSESSMENT AND PLAN  Abnormal stress test: Recent PET stress test showed evidence of infarct with mild peri-infarct ischemia.  Talking with the patient, he continued to have chest pain both at rest and with exertion however mostly with exertion.  He is on at least 2 antianginal medication.  He is not on beta-blocker due to bradycardia.  Case discussed with DOD Dr. Kriste, given abnormal stress test and persistent symptoms, we will proceed with cardiac  catheterization with possible percutaneous coronary intervention.  Risk and benefit of the procedure has been discussed with the patient who is agreeable to proceed.  CAD s/p CABG: Continue aspirin , after his procedure, aspirin  can be lowered to 81 mg since bypass surgery was more than 4 years ago.  Hypertension: Add  low-dose 2.5 mg amlodipine  for antianginal purposes  Hyperlipidemia: Patient has been started on Repatha , he is no longer taking Nexclizet.  It appears repeat lipid panel after switching to Repatha  continue to show LDL greater than 55, will have the patient follow-up with lipid clinic after his intervention.     Informed Consent   Shared Decision Making/Informed Consent The risks [stroke (1 in 1000), death (1 in 1000), kidney failure [usually temporary] (1 in 500), bleeding (1 in 200), allergic reaction [possibly serious] (1 in 200)], benefits (diagnostic support and management of coronary artery disease) and alternatives of a cardiac catheterization were discussed in detail with Mr. Biggar and he is willing to proceed.     Dispo: Follow-up in 4 weeks  Signed, Edgel Degnan, PA  "

## 2025-01-16 NOTE — Progress Notes (Unsigned)
 " Cardiology Office Note   Date:  01/17/2025  ID:  Tulio, Facundo 03-06-1956, MRN [id number] PCP: Antonio Meth, [name], DO  [facility] Providers Cardiologist:  Kardie Tobb, DO Sleep Medicine:  Debby Sor, MD (Inactive)     History of Present Illness CLAIRE BRIDGE is a 69 y.o. 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  He had IVUS guided DES to proximal to mid LAD, PTCA of ostial D1 and overlapping DES x 2 to ostial/proximal left circumflex artery in November 2021.  Repeat cardiac catheterization in December 2021 showed stable coronary artery disease.  Patient had another cardiac catheterization in December 2022 that showed 40% left main lesion and 95% ostial to proximal LAD lesion, EF 55 to 65%.  The location of left main involvement make PCI of LAD suboptimal, therefore he ultimately underwent CABG.  He underwent repeat cardiac catheterization on 03/18/2023 that showed widely patent LIMA-LAD with 95% ostial LAD lesion, widely patent SVG-OM, 50% RPAV residual, EF 55 to 65%.  Echocardiogram obtained in February 2025 showed EF 60 to 65%, mild LVH, mild MR, trivial AI, mild aortic stenosis.  Patient was seen by Dr. Sheena in February 2025 due to intermittent chest pain and was started on Ranexa .  Unable to add Imdur  or amlodipine  due to low blood pressure.  He was later started on Mounjaro  and had 20 pound weight loss.  Patient was last seen by Lorette Kapur PA-C on 11/08/2024 at which time he continued to have occasional chest discomfort, he was started on Imdur  15 mg daily.  He was also referred to our clinical pharmacist to consider Repatha  as LDL remain elevated 148.   I last saw the patient on 12/07/2024 at which time he continued to have intermittent chest discomfort.  Symptom occurs both at rest and with exertion, therefore I recommended PET stress test.  PET stress test was  obtained on 01/03/2025 which came back showing anterolateral infarction with minimal peri-infarct ischemia with decreased left circumflex stress flow, study was intermediate risk due to blood flow reserve.  There was medium defect of moderate reduction in uptake present in the apical to basal anterolateral location that is partially reversible, EF 57%.  There is also bilateral pulmonary nodule up to 7 mm, both of those nodules are stable dating back to April 2024, therefore suggestive of benign etiology.  Due to persistent chest discomfort, I increase his Imdur  to 30 mg daily.  He was started on Repatha  by lipid clinic.   Patient presents today for cardiology follow-up.  We reviewed stress test results.  The stress test primarily showed anterolateral scar but there was minimal peri-infarct ischemia.  Talking with the patient, he is having almost daily episode of chest pain, primarily when he exert himself and the last a few minutes each time.  When he was first placed on 15 mg daily of Imdur , symptoms improved for the first 3 days before worsens again.  After I increased the Imdur  to 30 mg, symptoms improved again.  I discussed his case with Dr. Segel, given almost daily episode of chest discomfort, he has failed antianginal therapy.  Dr. Segel recommended adding 2.5 mg daily of amlodipine  for further antianginal purposes, however this patient ideally should go through with cardiac catheterization.  Risk and benefit of cardiac catheterization has been discussed with the patient who is agreeable to proceed.  We will  obtain blood work today.  He has self stopped Mounjaro  for the past month.  He is aware to hold Jardiance  for 3 days prior to the procedure.  At this time, he is no longer on Nexclizet since he started on the Repatha .  Most recent blood work did show improvement in the LDL, however LDL still higher than 55.  I will see the patient back in 4 weeks and potentially have patient follow-up with lipid clinic  again.  ROS:   Patient complains of chest pain both at rest and with exertion, mostly with exertion.  He also complains of shortness of breath with exertion.  He has no lower extremity edema, orthopnea or PND.  Studies Reviewed EKG Interpretation Date/Time:  Monday January 16 2025 08:22:05 EST Ventricular Rate:  50 PR Interval:  150 QRS Duration:  116 QT Interval:  454 QTC Calculation: 413 R Axis:   105  Text Interpretation: Sinus bradycardia Rightward axis Abnormal QRS-T angle, consider primary T wave abnormality When compared with ECG of 07-Dec-2024 10:37, No significant change was found Confirmed by Caretha Rumbaugh [phone]) on 01/16/2025 8:28:31 AM    Cardiac Studies & Procedures   ______________________________________________________________________________________________ CARDIAC CATHETERIZATION  CARDIAC CATHETERIZATION 03/18/2023  Conclusion   Mid LM to Ost LAD lesion is 40% stenosed with 40% stenosed side branch in Ost Cx.  SVG to OM is widely patent.   Ost LAD to Prox LAD lesion is 95% stenosed.  LIMA to LAD is widely patent.   1st Diag lesion is 25% stenosed.   RPAV lesion is 50% stenosed.  Small vessel.   Non-stenotic Prox LAD to Mid LAD lesion was previously treated.   Prior circumflex stent is widely patent.   Prior OM1 stent is patent.   The left ventricular systolic function is normal.   LV end diastolic pressure is normal.  LVEDP 8 mm Hg.   The left ventricular ejection fraction is 55-65% by visual estimate.   There is no aortic valve stenosis.  Continue aggressive secondary prevention.  Findings Coronary Findings Diagnostic  Dominance: Right  Left Main Vessel is large. There is mild diffuse disease throughout the vessel. Mid LM to Ost LAD lesion is 40% stenosed with 40% stenosed side branch in Ost Cx. The lesion is located at the bifurcation.  Left Anterior Descending Vessel is large. Ost LAD to Prox LAD lesion is 95% stenosed. The lesion is located at the  bifurcation, focal and concentric. Non-stenotic Prox LAD to Mid LAD lesion was previously treated.  First Diagonal Branch Vessel is moderate in size. 1st Diag lesion is 25% stenosed. The lesion was previously treated .  Second Diagonal Branch Vessel is moderate in size.  Left Circumflex Vessel is large. Non-stenotic Prox Cx lesion was previously treated. The lesion is concentric. The lesion is moderately calcified. Proximal edge of the stent involved in the distal LM-ostial lesion.  This would mean stenting the LAD into the left main would potentially impinge the LCx requiring treatment of the LCx as well.  First Obtuse Marginal Branch Vessel is large in size. Non-stenotic 1st Mrg lesion was previously treated. Stent from LCx and OM  Second Obtuse Marginal Branch Vessel is small in size.  Third Obtuse Marginal Branch Vessel is small in size.  Right Coronary Artery Vessel is large. There is mild diffuse disease throughout the vessel.  Right Posterior Descending Artery Vessel is small in size.  Right Posterior Atrioventricular Artery Vessel is moderate in size. RPAV lesion is 50% stenosed.  Graft To  1st Mrg  LIMA Graft To Mid LAD  Intervention  No interventions have been documented.   CARDIAC CATHETERIZATION  CARDIAC CATHETERIZATION 12/16/2021  Conclusion   Mid LM to Ost LAD lesion is 40% stenosed with 40% stenosed side branch in Ost Cx.   Ost LAD to Prox LAD lesion is 95% stenosed.   Prox LAD to Mid LAD DES Stent is widely patent   Prox Cx- 1st Mrg DES STENT is widely patent   1st Diag lesion is 25% stenosed.   RPAV lesion is 50% stenosed.   -----------------------------------   The left ventricular systolic function is normal.  The left ventricular ejection fraction is 55-65% by visual estimate.   LV end diastolic pressure is normal.   There is no aortic valve stenosis.  SUMMARY Severe bifurcation disease involving distal Left Main with at least 40%  stenosis going into ostial LCx with a culprit lesion being ostial LAD 95% stenosis. -> Location of the stenosis and involvement of left main makes PCI of the LAD suboptimal especially in the patient who has pending surgery.  Would not be willing to stop antiplatelet agent after 3 to 6 months in the setting of Left Main Stenting. Only minimal RCA disease as well as downstream LCx and LAD disease. Preserved LVEF normal EDP.  RECOMMENDATION Based on significance of ostial left main disease and level of his symptoms, I feel it is safer to admit him to the hospital for CVTS consultation.  We will start IV heparin . Titrate home dose of statin Restart BP meds Sliding scale insulin    Alm Clay, MD  Findings Coronary Findings Diagnostic  Dominance: Right  Left Main Vessel is large. There is mild diffuse disease throughout the vessel. Mid LM to Ost LAD lesion is 40% stenosed with 40% stenosed side branch in Ost Cx. The lesion is located at the bifurcation.  Left Anterior Descending Vessel is large. Ost LAD to Prox LAD lesion is 95% stenosed. The lesion is located at the bifurcation, focal and concentric. Non-stenotic Prox LAD to Mid LAD lesion was previously treated.  First Diagonal Branch Vessel is moderate in size. 1st Diag lesion is 25% stenosed. The lesion was previously treated .  Second Diagonal Branch Vessel is moderate in size.  Left Circumflex Vessel is large. Non-stenotic Prox Cx lesion was previously treated. The lesion is concentric. The lesion is moderately calcified. Proximal edge of the stent involved in the distal LM-ostial lesion.  This would mean stenting the LAD into the left main would potentially impinge the LCx requiring treatment of the LCx as well.  First Obtuse Marginal Branch Vessel is large in size. Non-stenotic 1st Mrg lesion was previously treated. Stent from LCx and OM  Second Obtuse Marginal Branch Vessel is small in size.  Third Obtuse Marginal  Branch Vessel is small in size.  Right Coronary Artery Vessel is large. There is mild diffuse disease throughout the vessel.  Right Posterior Descending Artery Vessel is small in size.  Right Posterior Atrioventricular Artery Vessel is moderate in size. RPAV lesion is 50% stenosed.  Intervention  No interventions have been documented.   STRESS TESTS  NM PET CT CARDIAC PERFUSION MULTI W/ABSOLUTE BLOODFLOW 01/03/2025  Narrative   Findings are consistent with anterolateral infarction with minimal peri-infarct ischemia and with decreased LCX stress flows. The study is intermediate risk due to blood flow reserve (less specifici in patients with prior revascularization).   LV perfusion is abnormal. There is evidence of ischemia. There is evidence of infarction. Defect  1: There is a medium defect with moderate reduction in uptake present in the apical to basal anterolateral location(s) that is partially reversible. There is abnormal wall motion in the defect area. Consistent with infarction and peri-infarct ischemia.   Rest left ventricular function is normal. Rest EF: 57%. Stress left ventricular function is normal. Stress EF: 61%. End diastolic cavity size is normal. End systolic cavity size is normal.   Myocardial blood flow was computed to be 0.50ml/g/min at rest and 0.92ml/g/min at stress. Global myocardial blood flow reserve was 1.74 and was mildly abnormal.   Coronary calcium  assessment not performed due to prior revascularization. Aortic atherosclerosis.  Mild aortic valve calcifiations. Air artifact.   Electronically Signed  By: Stanly Leavens M.D.  EXAM: OVER-READ INTERPRETATION CARDIAC PET-CT  The following report is an over-read performed by radiologist Dr. Camellia Lang [facility] on 01/03/2025. This over-read does not include interpretation of cardiac or coronary anatomy or pathology. The cardiac PET and cardiac CT interpretation by the cardiologist is to  be attached.  COMPARISON:  Chest CT 01/29/2024  FINDINGS: No evidence for lymphadenopathy within the visualized mediastinum or hilar regions.  Similar spiculated right lung nodule measuring 6 mm on image 11/series 4. 7 mm left lower lobe nodule on 60/4 is stable.  Visualized portion of the upper abdomen shows no acute findings.  No worrisome lytic or sclerotic osseous abnormality.  IMPRESSION: Stable bilateral pulmonary nodules measuring up to 7 mm. Both of these nodules are also stable back to a study from 03/31/2023 providing almost 2 years of imaging stability and suggesting benign etiology.   Electronically Signed By: Camellia Candle M.D. On: 01/03/2025 08:34   ECHOCARDIOGRAM  ECHOCARDIOGRAM COMPLETE 02/10/2024  Narrative ECHOCARDIOGRAM REPORT    Patient Name:   AYINDE SWIM Kuhl Date of Exam: 02/10/2024 Medical Rec #:  [id number]        Height:       71.0 in Accession #:    [id number]       Weight:       205.0 lb Date of Birth:  May 15, 1956        BSA:          2.131 m Patient Age:    69 years         BP:           114/70 mmHg Patient Gender: M                HR:           58 bpm. Exam Location:  [address]  Procedure: 2D Echo, 3D Echo and Strain Analysis (Both Spectral and Color Flow Doppler were utilized during procedure).  Indications:    R06.00 Dyspnea  History:        Patient has prior history of Echocardiogram examinations, most recent 12/24/2021. CHF, CAD, Prior CABG, Signs/Symptoms:Fatigue, Chest Pain and Shortness of Breath; Risk Factors:Hypertension, Diabetes, Dyslipidemia, Former Smoker and Sleep Apnea. Leg swelling. Pleural effusion. Mild pulmonary hypertension. Mild aortic stenosis. Aortic atherosclerosis.  Sonographer:    Jon Hacker RCS Referring Phys: [id number] ADEWALE A OLALERE  IMPRESSIONS   1. Left ventricular ejection fraction, by estimation, is 60 to 65%. The left ventricle has normal function. The left ventricle has no regional wall  motion abnormalities. There is mild left ventricular hypertrophy. Left ventricular diastolic parameters are indeterminate. The average left ventricular global longitudinal strain is -21.0 %. The global longitudinal strain is normal. 2. Right ventricular systolic function is normal. The right  ventricular size is normal. Tricuspid regurgitation signal is inadequate for assessing PA pressure. 3. Mild mitral valve regurgitation. 4. There is moderate calcification of the aortic valve. Aortic valve regurgitation is trivial. Mild aortic valve stenosis. 5. The inferior vena cava is normal in size with greater than 50% respiratory variability, suggesting right atrial pressure of 3 mmHg.  FINDINGS Left Ventricle: Left ventricular ejection fraction, by estimation, is 60 to 65%. The left ventricle has normal function. The left ventricle has no regional wall motion abnormalities. The average left ventricular global longitudinal strain is -21.0 %. Strain was performed and the global longitudinal strain is normal. The left ventricular internal cavity size was normal in size. There is mild left ventricular hypertrophy. Left ventricular diastolic parameters are indeterminate.  Right Ventricle: The right ventricular size is normal. Right ventricular systolic function is normal. Tricuspid regurgitation signal is inadequate for assessing PA pressure.  Left Atrium: Left atrial size was normal in size.  Right Atrium: Right atrial size was normal in size.  Pericardium: There is no evidence of pericardial effusion.  Mitral Valve: Mild mitral valve regurgitation.  Tricuspid Valve: Tricuspid valve regurgitation is not demonstrated.  Aortic Valve: There is moderate calcification of the aortic valve. Aortic valve regurgitation is trivial. Mild aortic stenosis is present. Aortic valve mean gradient measures 9.0 mmHg. Aortic valve peak gradient measures 17.0 mmHg. Aortic valve area, by VTI measures 1.83 cm.  Pulmonic  Valve: Pulmonic valve regurgitation is mild.  Aorta: The aortic root and ascending aorta are structurally normal, with no evidence of dilitation.  Venous: The inferior vena cava is normal in size with greater than 50% respiratory variability, suggesting right atrial pressure of 3 mmHg.  IAS/Shunts: The interatrial septum was not well visualized.  Additional Comments: 3D was performed not requiring image post processing on an independent workstation and was normal.   LEFT VENTRICLE PLAX 2D LVIDd:         4.40 cm   Diastology LVIDs:         2.70 cm   LV e' medial:    7.72 cm/s LV PW:         1.00 cm   LV E/e' medial:  14.2 LV IVS:        1.10 cm   LV e' lateral:   15.80 cm/s LVOT diam:     2.10 cm   LV E/e' lateral: 7.0 LV SV:         78 LV SV Index:   37        2D Longitudinal Strain LVOT Area:     3.46 cm  2D Strain GLS Avg:     -21.0 %  3D Volume EF: 3D EF:        58 % LV EDV:       162 ml LV ESV:       68 ml LV SV:        93 ml  RIGHT VENTRICLE RV Basal diam:  3.80 cm RV S prime:     11.20 cm/s TAPSE (M-mode): 2.0 cm  LEFT ATRIUM             Index        RIGHT ATRIUM           Index LA diam:        4.65 cm 2.18 cm/m   RA Area:     16.90 cm LA Vol (A2C):   44.5 ml 20.88 ml/m  RA Volume:   45.20 ml  21.21 ml/m LA Vol (A4C):   65.1 ml 30.55 ml/m LA Biplane Vol: 55.5 ml 26.05 ml/m AORTIC VALVE                     PULMONIC VALVE AV Area (Vmax):    1.62 cm      PR End Diast Vel: 4.39 msec AV Area (Vmean):   1.45 cm AV Area (VTI):     1.83 cm AV Vmax:           206.00 cm/s AV Vmean:          138.000 cm/s AV VTI:            0.426 m AV Peak Grad:      17.0 mmHg AV Mean Grad:      9.0 mmHg LVOT Vmax:         96.50 cm/s LVOT Vmean:        57.800 cm/s LVOT VTI:          0.225 m LVOT/AV VTI ratio: 0.53  AORTA Ao Root diam: 3.60 cm Ao Asc diam:  3.80 cm  MITRAL VALVE MV Area (PHT): 4.74 cm     SHUNTS MV Decel Time: 160 msec     Systemic VTI:  0.22 m MV E  velocity: 110.00 cm/s  Systemic Diam: 2.10 cm MV A velocity: 65.00 cm/s MV E/A ratio:  1.69  Photographer signed by Ronal Ross Signature Date/Time: 02/10/2024/2:05:26 PM    Final   TEE  ECHO INTRAOPERATIVE TEE 12/24/2021  Narrative *INTRAOPERATIVE TRANSESOPHAGEAL REPORT *    Patient Name:   JAYLYNN SIEFERT Shenk Date of Exam: 12/24/2021 Medical Rec #:  [id number]        Height:       71.0 in Accession #:    [id number]       Weight:       185.0 lb Date of Birth:  [birth year]        BSA:          2.04 m Patient Age:    65 years         BP:           157/74 mmHg Patient Gender: M                HR:           46 bpm. Exam Location:  Inpatient  Transesophogeal exam was perform intraoperatively during surgical procedure. Patient was closely monitored under general anesthesia during the entirety of examination.  Indications:     coronary artery bypass surgery Performing Phys: [id number] LINNIE KIDD LIGHTFOOT Diagnosing Phys: Norleen Pope MD  Complications: No known complications during this procedure. POST-OP IMPRESSIONS _ Left Ventricle: The left ventricle is unchanged from pre-bypass. _ Right Ventricle: The right ventricle appears unchanged from pre-bypass. _ Aorta: The aorta appears unchanged from pre-bypass. _ Left Atrial Appendage: The left atrial appendage appears unchanged from pre-bypass. _ Aortic Valve: The aortic valve appears unchanged from pre-bypass. _ Mitral Valve: The mitral valve appears unchanged from pre-bypass. _ Tricuspid Valve: The tricuspid valve appears unchanged from pre-bypass. _ Pulmonic Valve: The pulmonic valve appears unchanged from pre-bypass.  PRE-OP FINDINGS Left Ventricle: The left ventricle has normal systolic function, with an ejection fraction of 55-60%. The cavity size was mildly dilated.   Right Ventricle: The right ventricle has normal systolic function. The cavity was normal. There is no increase in right ventricular wall  thickness.  Left Atrium: Left atrial size  was dilated. No left atrial/left atrial appendage thrombus was detected. The left atrial appendage is well visualized and there is no evidence of thrombus present.  Right Atrium: Right atrial size was normal in size.  Interatrial Septum: No atrial level shunt detected by color flow Doppler. There is no evidence of a patent foramen ovale.  Pericardium: There is no evidence of pericardial effusion.  Mitral Valve: The mitral valve is normal in structure. Mitral valve regurgitation is mild by color flow Doppler.  Tricuspid Valve: The tricuspid valve was normal in structure. Tricuspid valve regurgitation is mild by color flow Doppler.  Aortic Valve: The aortic valve is tricuspid Aortic valve regurgitation is trivial by color flow Doppler. There is no stenosis of the aortic valve.   Pulmonic Valve: The pulmonic valve was normal in structure. Pulmonic valve regurgitation is trivial by color flow Doppler.   Aorta: The ascending aorta and aortic root are normal in size and structure. There is evidence of plaque in the descending aorta; Grade II, measuring 2-66mm in size.  Shunts: There is no evidence of an atrial septal defect.  +-------------+------------++ AORTIC VALVE              +-------------+------------++ AV Vmax:     206.00 cm/s  +-------------+------------++ AV Vmean:    124.000 cm/s +-------------+------------++ AV VTI:      0.514 m      +-------------+------------++ AV Peak Grad:17.0 mmHg    +-------------+------------++ AV Mean Grad:8.0 mmHg     +-------------+------------++   Norleen Pope MD Electronically signed by Norleen Pope MD Signature Date/Time: 12/24/2021/5:45:24 PM    Final  MONITORS  LONG TERM MONITOR (3-14 DAYS) 06/09/2022  Narrative Patch Wear Time:  14 days and 0 hours (2023-05-21T23:55:19-0400 to 2023-06-04T23:55:23-0400)  Patient had a min HR of 45 bpm, max HR of 102 bpm, and  avg HR of 60 bpm. Predominant underlying rhythm was Sinus Rhythm. Isolated SVEs were rare (<1.0%), SVE Couplets were rare (<1.0%), and SVE Triplets were rare (<1.0%). Isolated VEs were rare (<1.0%, 4309), VE Triplets were rare (<1.0%, 1), and no VE Couplets were present. Ventricular Trigeminy was present.   Conclusion: Unremarkable study with no evidence of significant arrhythmia.       ______________________________________________________________________________________________      Risk Assessment/Calculations           Physical Exam VS:  BP 114/66 (BP Location: Left Arm, Patient Position: Sitting, Cuff Size: Large)   Pulse (!) 50   Ht 5' 11 (1.803 m)   Wt 198 lb (89.8 kg)   SpO2 98%   BMI 27.62 kg/m        Wt Readings from Last 3 Encounters:  01/16/25 198 lb (89.8 kg)  12/07/24 197 lb (89.4 kg)  11/08/24 187 lb 6.4 oz (85 kg)    GEN: Well nourished, well developed in no acute distress NECK: No JVD; No carotid bruits CARDIAC: RRR, no murmurs, rubs, gallops RESPIRATORY:  Clear to auscultation without rales, wheezing or rhonchi  ABDOMEN: Soft, non-tender, non-distended EXTREMITIES:  No edema; No deformity   ASSESSMENT AND PLAN  Abnormal stress test: Recent PET stress test showed evidence of infarct with mild peri-infarct ischemia.  Talking with the patient, he continued to have chest pain both at rest and with exertion however mostly with exertion.  He is on at least 2 antianginal medication.  He is not on beta-blocker due to bradycardia.  Case discussed with DOD Dr. Kriste, given abnormal stress test and persistent symptoms, we will proceed with cardiac  catheterization with possible percutaneous coronary intervention.  Risk and benefit of the procedure has been discussed with the patient who is agreeable to proceed.  CAD s/p CABG: Continue aspirin , after his procedure, aspirin  can be lowered to 81 mg since bypass surgery was more than 4 years ago.  Hypertension: Add  low-dose 2.5 mg amlodipine  for antianginal purposes  Hyperlipidemia: Patient has been started on Repatha , he is no longer taking Nexclizet.  It appears repeat lipid panel after switching to Repatha  continue to show LDL greater than 55, will have the patient follow-up with lipid clinic after his intervention.     Informed Consent   Shared Decision Making/Informed Consent The risks [stroke (1 in 1000), death (1 in 1000), kidney failure [usually temporary] (1 in 500), bleeding (1 in 200), allergic reaction [possibly serious] (1 in 200)], benefits (diagnostic support and management of coronary artery disease) and alternatives of a cardiac catheterization were discussed in detail with Mr. Mccree and he is willing to proceed.     Dispo: Follow-up in 4 weeks  Signed, Domonique Cothran, PA  "

## 2025-01-17 ENCOUNTER — Ambulatory Visit: Payer: Self-pay | Admitting: Physician Assistant

## 2025-01-17 ENCOUNTER — Other Ambulatory Visit: Payer: Self-pay

## 2025-01-17 ENCOUNTER — Other Ambulatory Visit (HOSPITAL_BASED_OUTPATIENT_CLINIC_OR_DEPARTMENT_OTHER): Payer: Self-pay

## 2025-01-17 ENCOUNTER — Other Ambulatory Visit: Payer: Self-pay | Admitting: Physician Assistant

## 2025-01-19 ENCOUNTER — Telehealth: Payer: Self-pay | Admitting: *Deleted

## 2025-01-19 NOTE — Telephone Encounter (Addendum)
 Cardiac Catheterization scheduled at [facility] - Closed for: January 23, 2025 7:30 AM Arrival time [facility] Main Entrance A at: 5:30 AM  Diet: -Nothing to eat after midnight.  Hydration: -May drink clear liquids until 2 hours before the procedure.  Approved liquids: Water, clear tea, black coffee, fruit juices-non-citric and without pulp,Gatorade, plain Jello/popsicles.   -Please drink 16 oz of water 2 hours before procedure.  Medication instructions: -Hold:  Jardiance  -AM of procedure   Mounjaro -weekly-pt tells me he has held for several weeks.  -Other usual morning medications can be taken including aspirin  81 mg.    Plan to go home the same day, you will only stay overnight if medically necessary.  You must have responsible adult to drive you home.  Someone must be with you the first 24 hours after you arrive home.  Reviewed procedure instructions with patient. Patient did not want to reschedule cath at this time due to possible inclement weather on Monday. Patient has information to call if he does not feel he can get safely to the hospital Monday and needs to reschedule.

## 2025-01-22 NOTE — Pre-Procedure Instructions (Signed)
 Spoke with patient, pt aware procedure cancelled for Monday 1/26.  Rescheduled him for Wednesday 1/28 @ 1000.  Arrive @ 0800.  He verbalized understanding, he is aware to follow same instructions.

## 2025-01-24 NOTE — Telephone Encounter (Addendum)
 Cardiac Cath was rescheduled to 01/25/25.  Cardiac Catheterization scheduled at [facility] for: Wednesday January 25, 2025 10 AM Arrival time [facility] Main Entrance A at: 8 AM  Diet: -Nothing to eat after midnight.  Hydration: -May drink clear liquids until 2 hours before the procedure.  Approved liquids: Water , clear tea, black coffee, fruit juices-non-citric and without pulp,Gatorade, plain Jello/popsicles.   -Please drink 16  oz of water  2 hours before procedure.  Medication instructions: -Hold:  Jardiance -AM of procedure  Mounjaro -weekly  -Other usual morning medications can be taken including aspirin  81 mg.  Plan to go home the same day, you will only stay overnight if medically necessary.  You must have responsible adult to drive you home.  Someone must be with you the first 24 hours after you arrive home.   Left message for patient to call back to review instructions

## 2025-01-25 ENCOUNTER — Ambulatory Visit (HOSPITAL_COMMUNITY)
Admission: RE | Admit: 2025-01-25 | Discharge: 2025-01-25 | Disposition: A | Attending: Cardiology | Admitting: Cardiology

## 2025-01-25 ENCOUNTER — Encounter (HOSPITAL_COMMUNITY): Admission: RE | Disposition: A | Payer: Self-pay | Source: Home / Self Care | Attending: Cardiology

## 2025-01-25 ENCOUNTER — Other Ambulatory Visit: Payer: Self-pay

## 2025-01-25 ENCOUNTER — Ambulatory Visit: Payer: Self-pay | Admitting: Physician Assistant

## 2025-01-25 DIAGNOSIS — G4733 Obstructive sleep apnea (adult) (pediatric): Secondary | ICD-10-CM | POA: Diagnosis not present

## 2025-01-25 DIAGNOSIS — Z955 Presence of coronary angioplasty implant and graft: Secondary | ICD-10-CM | POA: Diagnosis not present

## 2025-01-25 DIAGNOSIS — E785 Hyperlipidemia, unspecified: Secondary | ICD-10-CM | POA: Insufficient documentation

## 2025-01-25 DIAGNOSIS — I10 Essential (primary) hypertension: Secondary | ICD-10-CM | POA: Insufficient documentation

## 2025-01-25 DIAGNOSIS — I083 Combined rheumatic disorders of mitral, aortic and tricuspid valves: Secondary | ICD-10-CM | POA: Insufficient documentation

## 2025-01-25 DIAGNOSIS — Z951 Presence of aortocoronary bypass graft: Secondary | ICD-10-CM | POA: Insufficient documentation

## 2025-01-25 DIAGNOSIS — E119 Type 2 diabetes mellitus without complications: Secondary | ICD-10-CM | POA: Insufficient documentation

## 2025-01-25 DIAGNOSIS — Z79899 Other long term (current) drug therapy: Secondary | ICD-10-CM | POA: Insufficient documentation

## 2025-01-25 DIAGNOSIS — Z7985 Long-term (current) use of injectable non-insulin antidiabetic drugs: Secondary | ICD-10-CM | POA: Diagnosis not present

## 2025-01-25 DIAGNOSIS — R9439 Abnormal result of other cardiovascular function study: Secondary | ICD-10-CM | POA: Diagnosis present

## 2025-01-25 DIAGNOSIS — I2584 Coronary atherosclerosis due to calcified coronary lesion: Secondary | ICD-10-CM | POA: Diagnosis not present

## 2025-01-25 DIAGNOSIS — I2510 Atherosclerotic heart disease of native coronary artery without angina pectoris: Secondary | ICD-10-CM | POA: Diagnosis not present

## 2025-01-25 LAB — GLUCOSE, CAPILLARY
Glucose-Capillary: 130 mg/dL — ABNORMAL HIGH (ref 70–99)
Glucose-Capillary: 112 mg/dL — ABNORMAL HIGH (ref 70–99)

## 2025-01-25 MED ORDER — HEPARIN SODIUM (PORCINE) 1000 UNIT/ML IJ SOLN
INTRAMUSCULAR | Status: AC
  Filled 2025-01-25: qty 10

## 2025-01-25 MED ORDER — SODIUM CHLORIDE 0.9 % IV SOLN: 250.0000 mL | INTRAVENOUS | Status: DC | PRN

## 2025-01-25 MED ORDER — SODIUM CHLORIDE 0.9% FLUSH: 3.0000 mL | Freq: Two times a day (BID) | INTRAVENOUS | Status: DC

## 2025-01-25 MED ORDER — LIDOCAINE HCL (PF) 1 % IJ SOLN
INTRAMUSCULAR | Status: AC
  Filled 2025-01-25: qty 30

## 2025-01-25 MED ORDER — FENTANYL CITRATE (PF) 100 MCG/2ML IJ SOLN
INTRAMUSCULAR | Status: DC | PRN
  Administered 2025-01-25: 50 ug via INTRAVENOUS

## 2025-01-25 MED ORDER — HEPARIN SODIUM (PORCINE) 1000 UNIT/ML IJ SOLN
INTRAMUSCULAR | Status: DC | PRN
  Administered 2025-01-25: 4000 [IU] via INTRAVENOUS

## 2025-01-25 MED ORDER — SODIUM CHLORIDE 0.9% FLUSH: 3.0000 mL | INTRAVENOUS | Status: DC | PRN

## 2025-01-25 MED ORDER — HEPARIN (PORCINE) IN NACL 1000-0.9 UT/500ML-% IV SOLN
INTRAVENOUS | Status: DC | PRN
  Administered 2025-01-25 (×2): 500 mL

## 2025-01-25 MED ORDER — FENTANYL CITRATE (PF) 100 MCG/2ML IJ SOLN
INTRAMUSCULAR | Status: AC
  Filled 2025-01-25: qty 2

## 2025-01-25 MED ORDER — MIDAZOLAM HCL (PF) 2 MG/2ML IJ SOLN
INTRAMUSCULAR | Status: DC | PRN
  Administered 2025-01-25: 1 mg via INTRAVENOUS

## 2025-01-25 MED ORDER — ONDANSETRON HCL 4 MG/2ML IJ SOLN: 4.0000 mg | Freq: Four times a day (QID) | INTRAMUSCULAR | Status: DC | PRN

## 2025-01-25 MED ORDER — MIDAZOLAM HCL 2 MG/2ML IJ SOLN
INTRAMUSCULAR | Status: AC
  Filled 2025-01-25: qty 2

## 2025-01-25 MED ORDER — ACETAMINOPHEN 325 MG PO TABS: 650.0000 mg | ORAL_TABLET | ORAL | Status: DC | PRN

## 2025-01-25 MED ORDER — VERAPAMIL HCL 2.5 MG/ML IV SOLN
INTRAVENOUS | Status: AC
  Filled 2025-01-25: qty 2

## 2025-01-25 MED ORDER — FREE WATER: 500.0000 mL | Freq: Once | Status: DC

## 2025-01-25 MED ORDER — VERAPAMIL HCL 2.5 MG/ML IV SOLN
INTRAVENOUS | Status: DC | PRN
  Administered 2025-01-25: 10 mL via INTRA_ARTERIAL

## 2025-01-25 MED ORDER — IOHEXOL 350 MG/ML SOLN
INTRAVENOUS | Status: DC | PRN
  Administered 2025-01-25: 60 mL

## 2025-01-25 MED ORDER — ASPIRIN 81 MG PO CHEW: 81.0000 mg | CHEWABLE_TABLET | ORAL | Status: DC

## 2025-01-25 MED ORDER — LIDOCAINE HCL (PF) 1 % IJ SOLN
INTRAMUSCULAR | Status: DC | PRN
  Administered 2025-01-25: 5 mL via INTRADERMAL

## 2025-01-25 MED ORDER — SODIUM CHLORIDE 0.9 % IV SOLN
INTRAVENOUS | Status: DC | PRN
  Administered 2025-01-25: 50 mL/h via INTRAVENOUS

## 2025-01-25 NOTE — Discharge Instructions (Signed)

## 2025-01-25 NOTE — Progress Notes (Signed)
 TR band removed, gauze dressing applied. Left radial level 0, clean, dry, and intact.

## 2025-01-25 NOTE — Progress Notes (Signed)
 Thank you :)

## 2025-01-25 NOTE — Interval H&P Note (Signed)
 History and Physical Interval Note:  01/25/2025 9:38 AM  Ronald Petersen  has presented today for surgery, with the diagnosis of abnormal stress test.  The various methods of treatment have been discussed with the patient and family. After consideration of risks, benefits and other options for treatment, the patient has consented to  Procedures: LEFT HEART CATH AND CORS/GRAFTS ANGIOGRAPHY (N/A) and possible coronary angioplasty as a surgical intervention.  The patient's history has been reviewed, patient examined, no change in status, stable for surgery.  I have reviewed the patient's chart and labs.  Questions were answered to the patient's satisfaction.     Gordy Bergamo

## 2025-01-26 ENCOUNTER — Other Ambulatory Visit: Payer: Self-pay

## 2025-01-26 ENCOUNTER — Encounter (HOSPITAL_COMMUNITY): Payer: Self-pay | Admitting: Cardiology

## 2025-01-26 ENCOUNTER — Other Ambulatory Visit: Payer: Self-pay | Admitting: Family Medicine

## 2025-01-27 ENCOUNTER — Encounter: Payer: Self-pay | Admitting: Neurology

## 2025-01-27 ENCOUNTER — Other Ambulatory Visit (HOSPITAL_BASED_OUTPATIENT_CLINIC_OR_DEPARTMENT_OTHER): Payer: Self-pay

## 2025-01-27 ENCOUNTER — Ambulatory Visit: Payer: HMO | Admitting: Neurology

## 2025-01-27 VITALS — BP 117/71 | HR 62 | Ht 71.0 in | Wt 200.0 lb

## 2025-01-27 DIAGNOSIS — G621 Alcoholic polyneuropathy: Secondary | ICD-10-CM

## 2025-01-27 DIAGNOSIS — E519 Thiamine deficiency, unspecified: Secondary | ICD-10-CM | POA: Diagnosis not present

## 2025-01-27 DIAGNOSIS — E538 Deficiency of other specified B group vitamins: Secondary | ICD-10-CM

## 2025-01-27 DIAGNOSIS — E1142 Type 2 diabetes mellitus with diabetic polyneuropathy: Secondary | ICD-10-CM

## 2025-01-27 DIAGNOSIS — M48061 Spinal stenosis, lumbar region without neurogenic claudication: Secondary | ICD-10-CM | POA: Diagnosis not present

## 2025-01-27 DIAGNOSIS — G5603 Carpal tunnel syndrome, bilateral upper limbs: Secondary | ICD-10-CM | POA: Diagnosis not present

## 2025-01-27 MED ORDER — EMPAGLIFLOZIN 25 MG PO TABS
25.0000 mg | ORAL_TABLET | Freq: Every day | ORAL | 0 refills | Status: AC
  Filled 2025-01-27: qty 90, 90d supply, fill #0

## 2025-01-27 NOTE — Patient Instructions (Signed)
 Cock up wrist splint for carpal tunnel symptoms can be bought in local drug stores or online. This should especially be worn at night while sleeping.     Continue gabapentin  900 mg three times daily  I will see you again in 1 year. Please let me know if you have any questions or concerns in the meantime.   The physicians and staff at [facility] are committed to providing excellent care. You may receive a survey requesting feedback about your experience at our office. We strive to receive very good responses to the survey questions. If you feel that your experience would prevent you from giving the office a very good  response, please contact our office to try to remedy the situation. We may be reached at [phone]. Thank you for taking the time out of your busy day to complete the survey.  Venetia Potters, MD [facility]  Preventing Falls at [facility] are common, often dreaded events in the lives of older people. Aside from the obvious injuries and even death that may result, fall can cause wide-ranging consequences including loss of independence, mental decline, decreased activity and mobility. Younger people are also at risk of falling, especially those with chronic illnesses and fatigue.  Ways to reduce risk for falling Examine diet and medications. Warm foods and alcohol dilate blood vessels, which can lead to dizziness when standing. Sleep aids, antidepressants and pain medications can also increase the likelihood of a fall.  Get a vision exam. Poor vision, cataracts and glaucoma increase the chances of falling.  Check foot gear. Shoes should fit snugly and have a sturdy, nonskid sole and a broad, low heel  Participate in a physician-approved exercise program to build and maintain muscle strength and improve balance and coordination. Programs that use ankle weights or stretch bands are excellent for muscle-strengthening. Water  aerobics programs and low-impact Tai Chi  programs have also been shown to improve balance and coordination.  Increase vitamin D  intake. Vitamin D  improves muscle strength and increases the amount of calcium  the body is able to absorb and deposit in bones.  How to prevent falls from common hazards Floors - Remove all loose wires, cords, and throw rugs. Minimize clutter. Make sure rugs are anchored and smooth. Keep furniture in its usual place.  Chairs -- Use chairs with straight backs, armrests and firm seats. Add firm cushions to existing pieces to add height.  Bathroom - Install grab bars and non-skid tape in the tub or shower. Use a bathtub transfer bench or a shower chair with a back support Use an elevated toilet seat and/or safety rails to assist standing from a low surface. Do not use towel racks or bathroom tissue holders to help you stand.  Lighting - Make sure halls, stairways, and entrances are well-lit. Install a night light in your bathroom or hallway. Make sure there is a light switch at the top and bottom of the staircase. Turn lights on if you get up in the middle of the night. Make sure lamps or light switches are within reach of the bed if you have to get up during the night.  Kitchen - Install non-skid rubber mats near the sink and stove. Clean spills immediately. Store frequently used utensils, pots, pans between waist and eye level. This helps prevent reaching and bending. Sit when getting things out of lower cupboards.  Living room/ Bedrooms - Place furniture with wide spaces in between, giving enough room to move around. Establish a route through  the living room that gives you something to hold onto as you walk.  Stairs - Make sure treads, rails, and rugs are secure. Install a rail on both sides of the stairs. If stairs are a threat, it might be helpful to arrange most of your activities on the lower level to reduce the number of times you must climb the stairs.  Entrances and doorways - Install metal handles on the  walls adjacent to the doorknobs of all doors to make it more secure as you travel through the doorway.  Tips for maintaining balance Keep at least one hand free at all times. Try using a backpack or fanny pack to hold things rather than carrying them in your hands. Never carry objects in both hands when walking as this interferes with keeping your balance.  Attempt to swing both arms from front to back while walking. This might require a conscious effort if Parkinson's disease has diminished your movement. It will, however, help you to maintain balance and posture, and reduce fatigue.  Consciously lift your feet off of the ground when walking. Shuffling and dragging of the feet is a common culprit in losing your balance.  When trying to navigate turns, use a U technique of facing forward and making a wide turn, rather than pivoting sharply.  Try to stand with your feet shoulder-length apart. When your feet are close together for any length of time, you increase your risk of losing your balance and falling.  Do one thing at a time. Don't try to walk and accomplish another task, such as reading or looking around. The decrease in your automatic reflexes complicates motor function, so the less distraction, the better.  Do not wear rubber or gripping soled shoes, they might catch on the floor and cause tripping.  Move slowly when changing positions. Use deliberate, concentrated movements and, if needed, use a grab bar or walking aid. Count 15 seconds between each movement. For example, when rising from a seated position, wait 15 seconds after standing to begin walking.  If balance is a continuous problem, you might want to consider a walking aid such as a cane, walking stick, or walker. Once you've mastered walking with help, you might be ready to try it on your own again.

## 2025-01-30 ENCOUNTER — Other Ambulatory Visit

## 2025-02-01 ENCOUNTER — Other Ambulatory Visit: Payer: Self-pay | Admitting: Family Medicine

## 2025-02-01 ENCOUNTER — Other Ambulatory Visit: Payer: Self-pay | Admitting: Neurology

## 2025-02-01 DIAGNOSIS — R29898 Other symptoms and signs involving the musculoskeletal system: Secondary | ICD-10-CM

## 2025-02-01 DIAGNOSIS — G621 Alcoholic polyneuropathy: Secondary | ICD-10-CM

## 2025-02-01 DIAGNOSIS — M48061 Spinal stenosis, lumbar region without neurogenic claudication: Secondary | ICD-10-CM

## 2025-02-01 DIAGNOSIS — R209 Unspecified disturbances of skin sensation: Secondary | ICD-10-CM

## 2025-02-01 DIAGNOSIS — M51369 Other intervertebral disc degeneration, lumbar region without mention of lumbar back pain or lower extremity pain: Secondary | ICD-10-CM

## 2025-02-01 DIAGNOSIS — M4802 Spinal stenosis, cervical region: Secondary | ICD-10-CM

## 2025-02-01 MED ORDER — OXYCODONE-ACETAMINOPHEN 5-325 MG PO TABS
1.0000 | ORAL_TABLET | ORAL | 0 refills | Status: AC | PRN
  Filled 2025-02-01: qty 180, 30d supply, fill #0

## 2025-02-01 NOTE — Telephone Encounter (Signed)
 Requesting: Percocet Contract: 06/05/2023 UDS: 06/05/2023 Last OV: 07/19/2024 Next OV: n/a Last Refill: 12/19/2024, #[id number] RF Database:   Please advise

## 2025-02-02 ENCOUNTER — Inpatient Hospital Stay: Admission: RE | Admit: 2025-02-02 | Discharge: 2025-02-02 | Attending: Pulmonary Disease

## 2025-02-02 ENCOUNTER — Other Ambulatory Visit (HOSPITAL_BASED_OUTPATIENT_CLINIC_OR_DEPARTMENT_OTHER): Payer: Self-pay

## 2025-02-02 DIAGNOSIS — R911 Solitary pulmonary nodule: Secondary | ICD-10-CM

## 2025-02-20 ENCOUNTER — Ambulatory Visit: Admitting: Physician Assistant

## 2025-10-05 ENCOUNTER — Ambulatory Visit

## 2026-01-31 ENCOUNTER — Ambulatory Visit: Payer: Self-pay | Admitting: Neurology
